# Patient Record
Sex: Female | Born: 1965 | Race: White | Hispanic: No | Marital: Single | Smoking: Former smoker
Health system: Southern US, Community
[De-identification: ages and names within clinical notes are randomized; demographics above are authoritative.]

## PROBLEM LIST (undated history)

## (undated) DIAGNOSIS — I639 Cerebral infarction, unspecified: Secondary | ICD-10-CM

## (undated) DIAGNOSIS — I428 Other cardiomyopathies: Secondary | ICD-10-CM

## (undated) DIAGNOSIS — I7409 Other arterial embolism and thrombosis of abdominal aorta: Secondary | ICD-10-CM

## (undated) DIAGNOSIS — R0789 Other chest pain: Secondary | ICD-10-CM

## (undated) DIAGNOSIS — I739 Peripheral vascular disease, unspecified: Secondary | ICD-10-CM

## (undated) DIAGNOSIS — E785 Hyperlipidemia, unspecified: Secondary | ICD-10-CM

## (undated) DIAGNOSIS — I771 Stricture of artery: Secondary | ICD-10-CM

## (undated) DIAGNOSIS — I509 Heart failure, unspecified: Secondary | ICD-10-CM

## (undated) DIAGNOSIS — Z9889 Other specified postprocedural states: Secondary | ICD-10-CM

## (undated) DIAGNOSIS — I200 Unstable angina: Secondary | ICD-10-CM

## (undated) DIAGNOSIS — I447 Left bundle-branch block, unspecified: Secondary | ICD-10-CM

## (undated) DIAGNOSIS — I10 Essential (primary) hypertension: Secondary | ICD-10-CM

## (undated) DIAGNOSIS — I340 Nonrheumatic mitral (valve) insufficiency: Secondary | ICD-10-CM

## (undated) DIAGNOSIS — R0600 Dyspnea, unspecified: Secondary | ICD-10-CM

## (undated) DIAGNOSIS — I2510 Atherosclerotic heart disease of native coronary artery without angina pectoris: Secondary | ICD-10-CM

## (undated) DIAGNOSIS — Z95810 Presence of automatic (implantable) cardiac defibrillator: Secondary | ICD-10-CM

## (undated) DIAGNOSIS — I2129 ST elevation (STEMI) myocardial infarction involving other sites: Secondary | ICD-10-CM

## (undated) HISTORY — PX: APPENDECTOMY: SHX54

## (undated) HISTORY — PX: TUBAL LIGATION: SHX77

---

## 2003-10-28 ENCOUNTER — Ambulatory Visit: Payer: Self-pay | Admitting: Family Medicine

## 2003-11-05 ENCOUNTER — Emergency Department: Payer: Self-pay | Admitting: Emergency Medicine

## 2003-11-05 ENCOUNTER — Other Ambulatory Visit: Payer: Self-pay

## 2003-11-23 ENCOUNTER — Ambulatory Visit: Payer: Self-pay | Admitting: Family Medicine

## 2004-02-25 ENCOUNTER — Emergency Department: Payer: Self-pay | Admitting: General Practice

## 2011-07-05 ENCOUNTER — Emergency Department: Payer: Self-pay | Admitting: Emergency Medicine

## 2011-07-05 LAB — COMPREHENSIVE METABOLIC PANEL
Albumin: 3.6 g/dL (ref 3.4–5.0)
Alkaline Phosphatase: 86 U/L (ref 50–136)
Bilirubin,Total: 0.5 mg/dL (ref 0.2–1.0)
Creatinine: 0.77 mg/dL (ref 0.60–1.30)
EGFR (African American): >60
Glucose: 81 mg/dL (ref 65–99)
Osmolality: 276 (ref 275–301)
SGOT(AST): 22 U/L (ref 15–37)
Sodium: 138 mmol/L (ref 136–145)
Total Protein: 7.6 g/dL (ref 6.4–8.2)

## 2011-07-05 LAB — CBC
HGB: 14.8 g/dL (ref 12.0–16.0)
MCH: 30.4 pg (ref 26.0–34.0)
MCHC: 33.4 g/dL (ref 32.0–36.0)
MCV: 91 fL (ref 80–100)

## 2011-07-05 LAB — TROPONIN I: Troponin-I: <0.02 ng/mL

## 2011-11-13 DIAGNOSIS — I2510 Atherosclerotic heart disease of native coronary artery without angina pectoris: Secondary | ICD-10-CM

## 2011-11-13 HISTORY — DX: Atherosclerotic heart disease of native coronary artery without angina pectoris: I25.10

## 2012-10-07 ENCOUNTER — Encounter (HOSPITAL_COMMUNITY): Payer: Self-pay | Admitting: *Deleted

## 2012-10-07 ENCOUNTER — Observation Stay (HOSPITAL_COMMUNITY)
Admission: EM | Admit: 2012-10-07 | Discharge: 2012-10-08 | Disposition: A | Discharge status: Home / Self Care | Payer: BC Managed Care – PPO | Attending: Internal Medicine | Admitting: Internal Medicine

## 2012-10-07 DIAGNOSIS — E785 Hyperlipidemia, unspecified: Secondary | ICD-10-CM | POA: Insufficient documentation

## 2012-10-07 DIAGNOSIS — Z8673 Personal history of transient ischemic attack (TIA), and cerebral infarction without residual deficits: Secondary | ICD-10-CM | POA: Insufficient documentation

## 2012-10-07 DIAGNOSIS — Z880 Allergy status to penicillin: Secondary | ICD-10-CM | POA: Insufficient documentation

## 2012-10-07 DIAGNOSIS — F17200 Nicotine dependence, unspecified, uncomplicated: Secondary | ICD-10-CM | POA: Insufficient documentation

## 2012-10-07 DIAGNOSIS — I4460 Unspecified fascicular block: Secondary | ICD-10-CM | POA: Insufficient documentation

## 2012-10-07 DIAGNOSIS — R0789 Other chest pain: Principal | ICD-10-CM | POA: Diagnosis present

## 2012-10-07 DIAGNOSIS — I10 Essential (primary) hypertension: Secondary | ICD-10-CM | POA: Diagnosis present

## 2012-10-07 DIAGNOSIS — Z720 Tobacco use: Secondary | ICD-10-CM | POA: Diagnosis present

## 2012-10-07 DIAGNOSIS — I447 Left bundle-branch block, unspecified: Secondary | ICD-10-CM | POA: Diagnosis present

## 2012-10-07 DIAGNOSIS — Z23 Encounter for immunization: Secondary | ICD-10-CM | POA: Insufficient documentation

## 2012-10-07 DIAGNOSIS — R079 Chest pain, unspecified: Secondary | ICD-10-CM

## 2012-10-07 DIAGNOSIS — Z9889 Other specified postprocedural states: Secondary | ICD-10-CM | POA: Insufficient documentation

## 2012-10-07 DIAGNOSIS — Z79899 Other long term (current) drug therapy: Secondary | ICD-10-CM | POA: Insufficient documentation

## 2012-10-07 DIAGNOSIS — Z7982 Long term (current) use of aspirin: Secondary | ICD-10-CM | POA: Insufficient documentation

## 2012-10-07 HISTORY — DX: Cerebral infarction, unspecified: I63.9

## 2012-10-07 HISTORY — DX: Essential (primary) hypertension: I10

## 2012-10-07 LAB — CBC
HCT: 45 % (ref 36.0–46.0)
Hemoglobin: 15.1 g/dL — ABNORMAL HIGH (ref 12.0–15.0)
MCH: 29.3 pg (ref 26.0–34.0)
MCV: 87.4 fL (ref 78.0–100.0)
RBC: 5.15 MIL/uL — ABNORMAL HIGH (ref 3.87–5.11)
WBC: 9.9 10*3/uL (ref 4.0–10.5)

## 2012-10-07 LAB — BASIC METABOLIC PANEL
BUN: 10 mg/dL (ref 6–23)
CO2: 22 mEq/L (ref 19–32)
Calcium: 9.2 mg/dL (ref 8.4–10.5)
Chloride: 102 mEq/L (ref 96–112)
Creatinine, Ser: 0.66 mg/dL (ref 0.50–1.10)
Glucose, Bld: 74 mg/dL (ref 70–99)

## 2012-10-07 LAB — POCT I-STAT TROPONIN I: Troponin i, poc: 0 ng/mL (ref 0.00–0.08)

## 2012-10-07 MED ORDER — ASPIRIN EC 325 MG PO TBEC: 325.0000 mg | DELAYED_RELEASE_TABLET | Freq: Every day | ORAL | Status: DC

## 2012-10-07 MED ORDER — ONDANSETRON HCL 4 MG/2ML IJ SOLN: 4.0000 mg | Freq: Four times a day (QID) | INTRAMUSCULAR | Status: DC | PRN

## 2012-10-07 MED ORDER — ENOXAPARIN SODIUM 40 MG/0.4ML ~~LOC~~ SOLN
40.0000 mg | SUBCUTANEOUS | Status: DC
  Filled 2012-10-07 (×2): qty 0.4

## 2012-10-07 MED ORDER — OXYCODONE-ACETAMINOPHEN 5-325 MG PO TABS
2.0000 | ORAL_TABLET | Freq: Once | ORAL | Status: AC
  Administered 2012-10-07: 2 via ORAL
  Filled 2012-10-07: qty 2

## 2012-10-07 MED ORDER — ACETAMINOPHEN 325 MG PO TABS: 650.0000 mg | ORAL_TABLET | ORAL | Status: DC | PRN

## 2012-10-07 MED ORDER — MORPHINE SULFATE 2 MG/ML IJ SOLN
2.0000 mg | INTRAMUSCULAR | Status: DC | PRN
  Administered 2012-10-08 (×3): 2 mg via INTRAVENOUS
  Filled 2012-10-07 (×3): qty 1

## 2012-10-07 MED ORDER — LISINOPRIL 5 MG PO TABS
5.0000 mg | ORAL_TABLET | Freq: Every day | ORAL | Status: DC
  Administered 2012-10-08: 5 mg via ORAL
  Filled 2012-10-07: qty 1

## 2012-10-07 MED ORDER — NITROGLYCERIN 0.4 MG/HR TD PT24
0.4000 mg | MEDICATED_PATCH | Freq: Every day | TRANSDERMAL | Status: DC
  Administered 2012-10-08 (×2): 0.4 mg via TRANSDERMAL
  Filled 2012-10-07 (×2): qty 1

## 2012-10-07 NOTE — ED Notes (Signed)
Pt states she had her cardiac craterizations at [facility] and [facility] (one at each).

## 2012-10-07 NOTE — ED Notes (Signed)
Phlebotomy called to inquire about blood work as it should be resulted by now.

## 2012-10-07 NOTE — ED Provider Notes (Signed)
CSN: [id number]     Arrival date & time 10/07/12  1910 History   First MD Initiated Contact with Patient 10/07/12 1916     Chief Complaint  Patient presents with  . Chest Pain   (Consider location/radiation/quality/duration/timing/severity/associated sxs/prior Treatment) HPI  47 y/o female with hx of CAD with 50% lesion in the L Cx artery presents with intermittent CP which started at 2 PM - has been intermittent and lasts several minutes - is a dull pain with intermittent sharp sensations into her chest - she is a smoker, has hld and htn.  She dmits to taking her medicines - onset was spontaneous at rest.  She does have hx of LBBB as seen on prior [facility] notes through Epic. Last cath was also evaluated and found to have above findings (see MDM).  Sx are worse with taking deep breath.  Past Medical History  Diagnosis Date  . Hypertension   . Stroke     tia with no deficits   Past Surgical History  Procedure Laterality Date  . Cardiac catheterization    . Tubal ligation    . Appendectomy     Family History  Problem Relation Age of Onset  . CAD Mother   . Lung cancer Mother   . Bladder Cancer Mother   . Stroke Mother   . CAD Father    History  Substance Use Topics  . Smoking status: Current Every Day Smoker  . Smokeless tobacco: Not on file  . Alcohol Use: No   OB History   Grav Para Term Preterm Abortions TAB SAB Ect Mult Living                 Review of Systems  All other systems reviewed and are negative.    Allergies  Penicillins  Home Medications   No current outpatient prescriptions on file. BP 133/76  Pulse 85  Temp(Src) 97.6 F (36.4 C) (Oral)  Resp 20  SpO2 96% Physical Exam  Nursing note and vitals reviewed. Constitutional: She appears well-developed and well-nourished. No distress.  HENT:  Head: Normocephalic and atraumatic.  Mouth/Throat: Oropharynx is clear and moist. No oropharyngeal exudate.  Eyes: Conjunctivae and EOM are normal. Pupils  are equal, round, and reactive to light. Right eye exhibits no discharge. Left eye exhibits no discharge. No scleral icterus.  Neck: Normal range of motion. Neck supple. No JVD present. No thyromegaly present.  Cardiovascular: Normal rate, regular rhythm, normal heart sounds and intact distal pulses.  Exam reveals no gallop and no friction rub.   No murmur heard. Pulmonary/Chest: Effort normal and breath sounds normal. No respiratory distress. She has no wheezes. She has no rales. She exhibits no tenderness.  Abdominal: Soft. Bowel sounds are normal. She exhibits no distension and no mass. There is no tenderness.  Musculoskeletal: Normal range of motion. She exhibits no edema and no tenderness.  Lymphadenopathy:    She has no cervical adenopathy.  Neurological: She is alert. Coordination normal.  Skin: Skin is warm and dry. No rash noted. No erythema.  Psychiatric: She has a normal mood and affect. Her behavior is normal.    ED Course  Procedures (including critical care time) Labs Review Labs Reviewed  CBC - Abnormal; Notable for the following:    RBC 5.15 (*)    Hemoglobin 15.1 (*)    All other components within normal limits  BASIC METABOLIC PANEL  CBC  CREATININE, SERUM  COMPREHENSIVE METABOLIC PANEL  CBC  TSH  URINE  RAPID DRUG SCREEN (HOSP PERFORMED)  PREGNANCY, URINE  POCT I-STAT TROPONIN I   Imaging Review No results found.  MDM   1. Chest pain   2. HTN (hypertension)    Pt is clutching the L side of her chest - she has a LBBB which is not new.  She has known 50% stenosis lesion.  She is at increased risk of obstructive CAD.    ED ECG REPORT  I personally interpreted this EKG   Date: 10/07/2012   Rate: 91  Rhythm: normal sinus rhythm  QRS Axis: left  Intervals: normal  ST/T Wave abnormalities: nonspecific ST/T changes  Conduction Disutrbances:left bundle branch block  Narrative Interpretation:   Old EKG Reviewed: none available  B/c of pt's risk and  abnormal ECG (LBBB) she will be admitted for r/o.  46yoF w/ h/o CVA, HTN, HLD presents as a transfer from [facility] ER for chest pain in the  setting of LBBB.   **Atypical Chest pain: Pt presented from OSH with question of new LBBB in the setting of a  historical 50% lesion to left circ. LBBB found not to be new. Pt enzymes were cycled and an EKG was  obtained. Both were negative. Given pts history of a known lesion in her proximal circ, it was  decided that the patient would benefit from a catheterization. Cath redemonstrated proximal circ  lesion, but FFR was >0.8 and no intervention was done.    Vida Roller, MD 10/07/12 2352

## 2012-10-07 NOTE — ED Notes (Signed)
Inquired mini lab about pt's I-stat troponin, troponin: 0.00, had not crossed over yet. MD informed.

## 2012-10-07 NOTE — ED Notes (Signed)
Pt with dull chest pain that increased to a sharp pain that started around 1500.  Hx of 2 cardiac caths.  Pt given 4 81 mg asa and 1 nitro with no decrease in pain.

## 2012-10-07 NOTE — ED Notes (Signed)
Phlebotomy called and inquired about pt's lab work as it was not resulted yet.

## 2012-10-07 NOTE — H&P (Addendum)
[facility] History and Physical  Lindsay Camacho ZOX:[id number] DOB: Aug 05, 1965 DOA: 10/07/2012  Referring physician: ER physician. PCP: No PCP Per Patient   Chief Complaint: Chest pain.  HPI: Lindsay Camacho is a 47 y.o. female history of hypertension hyperlipidemia ongoing tobacco abuse and previous history of stroke who has had a cardiac catheter last year in September at [facility] which showed 50% stenosis in the proximal left circumflex and was managed conservatively presents today because of chest pain. Patient states around 2 PM she started developing chest pain which was left-sided radiating to her left arm and back, stabbing in nature and has no relation to exertion. No associated shortness of breath diaphoresis or nausea. It happened multiple times. In the ER patient's chest pain got better after Percocet was given. Patient's cardiac markers have been negative. EKG shows LBBB and as per previous records obtained through Care Everywhere patient does have chronic LBBB. Patient has been admitted for further management. Patient otherwise denies any nausea vomiting abdominal pain fever chills.  Review of Systems: As presented in the history of presenting illness, rest negative.  Past Medical History  Diagnosis Date  . Hypertension   . Stroke     tia with no deficits   Past Surgical History  Procedure Laterality Date  . Cardiac catheterization    . Tubal ligation    . Appendectomy     Social History:  reports that she has been smoking.  She does not have any smokeless tobacco history on file. She reports that she does not drink alcohol or use illicit drugs. Home. where does patient live-- yes Can patient participate in ADLs?  Allergies  Allergen Reactions  . Penicillins Rash    Family History  Problem Relation Age of Onset  . CAD Mother   . Lung cancer Mother   . Bladder Cancer Mother   . Stroke Mother   . CAD Father       Prior to Admission medications    Medication Sig Start Date End Date Taking? Authorizing Provider  ibuprofen (ADVIL,MOTRIN) 200 MG tablet Take 600 mg by mouth daily as needed for headache.   Yes Historical Provider, MD  lisinopril (PRINIVIL,ZESTRIL) 5 MG tablet Take 5 mg by mouth daily.   Yes Historical Provider, MD   Physical Exam: Filed Vitals:   10/07/12 2100 10/07/12 2130 10/07/12 2200 10/07/12 2230  BP: 116/80 132/74 144/84 153/87  Pulse: 85 88 88 92  Temp:      TempSrc:      Resp: 20 15 16 15   SpO2: 100% 98% 97% 97%     General:  Well developed and moderately nourished.  Eyes: Anicteric no pallor.  ENT: No discharge from ears eyes nose mouth.  Neck: No mass felt.  Cardiovascular: S1-S2 heard.  Respiratory: No rhonchi or crepitations.  Abdomen: Soft nontender bowel sounds present.  Skin: No rash.  Musculoskeletal: No edema.  Psychiatric: Appears normal.  Neurologic: Alert awake oriented to time place and person. Moves all extremities.  Labs on Admission:  Basic Metabolic Panel:  Recent Labs Lab 10/07/12 1948  NA 137  K 3.7  CL 102  CO2 22  GLUCOSE 74  BUN 10  CREATININE 0.66  CALCIUM 9.2   Liver Function Tests: No results found for this basename: AST, ALT, ALKPHOS, BILITOT, PROT, ALBUMIN,  in the last 168 hours No results found for this basename: LIPASE, AMYLASE,  in the last 168 hours No results found for this basename: AMMONIA,  in the  last 168 hours CBC:  Recent Labs Lab 10/07/12 1948  WBC 9.9  HGB 15.1*  HCT 45.0  MCV 87.4  PLT 212   Cardiac Enzymes: No results found for this basename: CKTOTAL, CKMB, CKMBINDEX, TROPONINI,  in the last 168 hours  BNP (last 3 results) No results found for this basename: PROBNP,  in the last 8760 hours CBG: No results found for this basename: GLUCAP,  in the last 168 hours  Radiological Exams on Admission: No results found.  EKG: Independently reviewed. Normal sinus rhythm with LBBB.  Assessment/Plan Principal Problem:   Chest  pain Active Problems:   HTN (hypertension)   LBBB (left bundle branch block)   Tobacco abuse   1. Chest pain - appears atypical but given patient's risk factors would cycle cardiac markers. Check d-dimer. Check chest x-ray stat. Aspirin. Nitroglycerin. When necessary morphine. Check drug screen. 2. Chronic LBBB. 3. Hypertension - continue present medications. 4. Tobacco abuse - strongly advised to quit smoking. 5. History of CVA - presently has no residual symptoms. 6. Cardiac catheter last at [facility] - notes available through care everywhere.    Code Status: Full code.  Family Communication: None.  Disposition Plan: Admit for observation.    KAKRAKANDY,ARSHAD N. [facility] Pager [phone].  If 7PM-7AM, please contact night-coverage [email or web address] Password [email or web address] 10/07/2012, 11:11 PM

## 2012-10-07 NOTE — ED Notes (Signed)
Pt requesting pain medication, MD informed. Will wait to see lab work results first.

## 2012-10-08 ENCOUNTER — Encounter (HOSPITAL_COMMUNITY): Payer: Self-pay | Admitting: *Deleted

## 2012-10-08 ENCOUNTER — Observation Stay (HOSPITAL_COMMUNITY): Payer: BC Managed Care – PPO

## 2012-10-08 DIAGNOSIS — R0789 Other chest pain: Secondary | ICD-10-CM

## 2012-10-08 DIAGNOSIS — E785 Hyperlipidemia, unspecified: Secondary | ICD-10-CM

## 2012-10-08 DIAGNOSIS — F17200 Nicotine dependence, unspecified, uncomplicated: Secondary | ICD-10-CM

## 2012-10-08 LAB — CBC
HCT: 43 % (ref 36.0–46.0)
Hemoglobin: 14.5 g/dL (ref 12.0–15.0)
MCH: 29.3 pg (ref 26.0–34.0)
MCHC: 33.3 g/dL (ref 30.0–36.0)
MCV: 86.9 fL (ref 78.0–100.0)
MCV: 87.9 fL (ref 78.0–100.0)
Platelets: 192 10*3/uL (ref 150–400)
RBC: 4.95 MIL/uL (ref 3.87–5.11)
RDW: 13.9 % (ref 11.5–15.5)
RDW: 14.1 % (ref 11.5–15.5)
WBC: 8.7 10*3/uL (ref 4.0–10.5)

## 2012-10-08 LAB — PREGNANCY, URINE: Preg Test, Ur: NEGATIVE

## 2012-10-08 LAB — COMPREHENSIVE METABOLIC PANEL
ALT: 9 U/L (ref 0–35)
AST: 17 U/L (ref 0–37)
Albumin: 3.3 g/dL — ABNORMAL LOW (ref 3.5–5.2)
CO2: 28 mEq/L (ref 19–32)
Calcium: 8.9 mg/dL (ref 8.4–10.5)
Chloride: 104 mEq/L (ref 96–112)
GFR calc non Af Amer: >90 mL/min (ref >90)
Sodium: 140 mEq/L (ref 135–145)
Total Bilirubin: 0.7 mg/dL (ref 0.3–1.2)

## 2012-10-08 LAB — RAPID URINE DRUG SCREEN, HOSP PERFORMED
Amphetamines: NOT DETECTED
Barbiturates: NOT DETECTED
Tetrahydrocannabinol: POSITIVE — AB

## 2012-10-08 LAB — TSH: TSH: 2.912 u[IU]/mL (ref 0.350–4.500)

## 2012-10-08 LAB — D-DIMER, QUANTITATIVE (NOT AT ARMC): D-Dimer, Quant: 0.37 ug/mL-FEU (ref 0.00–0.48)

## 2012-10-08 LAB — CREATININE, SERUM: GFR calc Af Amer: >90 mL/min (ref >90)

## 2012-10-08 LAB — TROPONIN I: Troponin I: <0.3 ng/mL (ref <0.30)

## 2012-10-08 MED ORDER — METOPROLOL SUCCINATE ER 25 MG PO TB24: 25.0000 mg | ORAL_TABLET | Freq: Every day | ORAL | Status: DC

## 2012-10-08 MED ORDER — PNEUMOCOCCAL VAC POLYVALENT 25 MCG/0.5ML IJ INJ: 0.5000 mL | INJECTION | INTRAMUSCULAR | Status: DC

## 2012-10-08 MED ORDER — ATORVASTATIN CALCIUM 80 MG PO TABS: 80.0000 mg | ORAL_TABLET | Freq: Every day | ORAL | Status: DC

## 2012-10-08 MED ORDER — ASPIRIN 81 MG PO TABS: 81.0000 mg | ORAL_TABLET | Freq: Every day | ORAL | Status: DC

## 2012-10-08 MED ORDER — KETOROLAC TROMETHAMINE 30 MG/ML IJ SOLN
30.0000 mg | Freq: Once | INTRAMUSCULAR | Status: AC
  Administered 2012-10-08: 30 mg via INTRAVENOUS
  Filled 2012-10-08: qty 1

## 2012-10-08 NOTE — Progress Notes (Signed)
[facility]  [id number] [place] [address] Phone: [phone]  October 08, 2012  Patient: Lindsay Camacho  Date of Birth: 1965-11-30  Date of Visit: 10/07/2012    To Whom It May Concern:  Maretta Overdorf was seen and treated in our emergency department on 10/07/2012. Kalilah Barua  may return to work on 10/09/12.  Sincerely,     Crista Curb, M.D.

## 2012-10-08 NOTE — Progress Notes (Signed)
Pt with c/o sharp, stabbing left sided chest pain radiating to left arm.  Pt. States 8/10 pain score.  EKG obtained per standing orders.  Pt. States "Pain is better when I sit up instead of lie down."  When placing EKG electrodes on patient's chest patient withdrew from staff and states "that hurts when you touch my chest." 2mg  IV morphine given per orders.  Pt. With complete resolution of symptoms after morphine given.  BP 125/64. Will continue to monitor patient.

## 2012-10-08 NOTE — Progress Notes (Addendum)
Pt. C/o left sided CP radiating to arm.  C/o sharp, stabbing pain 4/10.  2mg  IV morphine given per orders with resolution of pain.  Pt instructed to call staff if CP returns.  Pt. Verbalizes understanding.  EKG done per protocol with possible ST elevation in leads v1, v2, v3, v4.  Per MD chronic ST elevation.  Nitrodur patch ordered by MD and placed on left upper arm.  Will continue to monitor.

## 2012-10-08 NOTE — Discharge Summary (Signed)
Physician Discharge Summary  Lindsay Camacho ZOX:[id number] DOB: 05/31/1965 DOA: 10/07/2012  PCP: No PCP Per Patient  Admit date: 10/07/2012 Discharge date: 10/08/2012  Recommendations for Outpatient Follow-up:  1. Needs to establish PCP  Discharge Diagnoses:  Principal Problem:   Chest pain, musculoskeletal Active Problems:   HTN (hypertension)   LBBB (left bundle branch block)   Tobacco abuse   Discharge Condition: stable  Filed Weights   10/07/12 2359  Weight: 55.656 kg (122 lb 11.2 oz)    History of present illness:  Lindsay Camacho is a 47 y.o. female history of hypertension hyperlipidemia ongoing tobacco abuse and previous history of stroke who has had a cardiac catheter last year in September at [facility] which showed 50% stenosis in the proximal left circumflex and was managed conservatively presents today because of chest pain. Patient states around 2 PM she started developing chest pain which was left-sided radiating to her left arm and back, stabbing in nature and has no relation to exertion. No associated shortness of breath diaphoresis or nausea. It happened multiple times. In the ER patient's chest pain got better after Percocet was given. Patient's cardiac markers have been negative. EKG shows LBBB and as per previous records obtained through Care Everywhere patient does have chronic LBBB. Patient has been admitted for further management. Patient otherwise denies any nausea vomiting abdominal pain fever chills.   Hospital Course:  Observed on tele.  MI ruled out.  Encouraged to quit smoking.  Started back on previous meds (she ran out of most PTA).    Procedures:  none  Consultations:  none  Discharge Exam: Filed Vitals:   10/08/12 0839  BP: 131/71  Pulse: 73  Temp: 97.6 F (36.4 C)  Resp: 16    General: alert, oriented and comfortable Cardiovascular: RRR without MGR Respiratory: CTA without WRR  Discharge Instructions  Discharge Orders   Future  Orders Complete By Expires   Activity as tolerated - No restrictions  As directed    Diet - low sodium heart healthy  As directed    Discharge instructions  As directed    Comments:     Quit smoking       Medication List         aspirin 81 MG tablet  Take 1 tablet (81 mg total) by mouth daily.     atorvastatin 80 MG tablet  Commonly known as:  LIPITOR  Take 1 tablet (80 mg total) by mouth daily.     ibuprofen 200 MG tablet  Commonly known as:  ADVIL,MOTRIN  Take 600 mg by mouth daily as needed for headache.     lisinopril 5 MG tablet  Commonly known as:  PRINIVIL,ZESTRIL  Take 5 mg by mouth daily.     metoprolol succinate 25 MG 24 hr tablet  Commonly known as:  TOPROL XL  Take 1 tablet (25 mg total) by mouth daily.       Allergies  Allergen Reactions  . Penicillins Rash       Follow-up Information   Follow up with establish primary care physician In 2 weeks.       The results of significant diagnostics from this hospitalization (including imaging, microbiology, ancillary and laboratory) are listed below for reference.    Significant Diagnostic Studies: Dg Chest Port 1 View  10/08/2012   *RADIOLOGY REPORT*  Clinical Data: Left-sided chest pain  PORTABLE CHEST - 1 VIEW  Comparison: None.  Findings: Single frontal view the chest demonstrates cardiac and mediastinal  contours which are within normal limits.  No focal airspace consolidation, pleural effusion or pneumothorax.  Mild prominence of the bibasilar interstitial markings may reflect subsegmental atelectasis.  No acute osseous abnormality.  IMPRESSION: Mildly increased prominence of the bibasilar interstitial markings favored to reflect subsegmental atelectasis.  Otherwise, no acute cardiopulmonary process.   Original Report Authenticated By: Malachy Moan, M.D.    Microbiology: No results found for this or any previous visit (from the past 240 hour(s)).   Labs: Basic Metabolic Panel:  Recent Labs Lab  10/07/12 1948 10/08/12 0151 10/08/12 0540  NA 137  --  140  K 3.7  --  4.0  CL 102  --  104  CO2 22  --  28  GLUCOSE 74  --  88  BUN 10  --  10  CREATININE 0.66 0.62 0.70  CALCIUM 9.2  --  8.9   Liver Function Tests:  Recent Labs Lab 10/08/12 0540  AST 17  ALT 9  ALKPHOS 93  BILITOT 0.7  PROT 7.2  ALBUMIN 3.3*   No results found for this basename: LIPASE, AMYLASE,  in the last 168 hours No results found for this basename: AMMONIA,  in the last 168 hours CBC:  Recent Labs Lab 10/07/12 1948 10/08/12 0151 10/08/12 0540  WBC 9.9 8.7 7.7  HGB 15.1* 14.9 14.5  HCT 45.0 43.0 43.5  MCV 87.4 86.9 87.9  PLT 212 192 213   Cardiac Enzymes:  Recent Labs Lab 10/08/12 0140 10/08/12 0540  TROPONINI <0.30 <0.30   BNP: BNP (last 3 results) No results found for this basename: PROBNP,  in the last 8760 hours CBG: No results found for this basename: GLUCAP,  in the last 168 hours  EKG Normal sinus rhythm Left bundle branch block Abnormal ECG Since last tracing rate slower  Signed:  [name] L  [facility] 10/08/2012, 10:19 AM

## 2013-01-02 DIAGNOSIS — I2129 ST elevation (STEMI) myocardial infarction involving other sites: Secondary | ICD-10-CM

## 2013-01-02 HISTORY — DX: ST elevation (STEMI) myocardial infarction involving other sites: I21.29

## 2013-01-17 ENCOUNTER — Emergency Department (HOSPITAL_COMMUNITY): Payer: BC Managed Care – PPO

## 2013-01-17 ENCOUNTER — Encounter (HOSPITAL_COMMUNITY): Payer: Self-pay | Admitting: Emergency Medicine

## 2013-01-17 ENCOUNTER — Inpatient Hospital Stay (HOSPITAL_COMMUNITY)
Admission: EM | Admit: 2013-01-17 | Discharge: 2013-01-20 | DRG: 246 | Disposition: A | Discharge status: Home / Self Care | Payer: BC Managed Care – PPO | Attending: Cardiovascular Disease | Admitting: Cardiovascular Disease

## 2013-01-17 DIAGNOSIS — L270 Generalized skin eruption due to drugs and medicaments taken internally: Secondary | ICD-10-CM | POA: Diagnosis not present

## 2013-01-17 DIAGNOSIS — I2589 Other forms of chronic ischemic heart disease: Secondary | ICD-10-CM | POA: Diagnosis present

## 2013-01-17 DIAGNOSIS — R0789 Other chest pain: Secondary | ICD-10-CM | POA: Diagnosis present

## 2013-01-17 DIAGNOSIS — I10 Essential (primary) hypertension: Secondary | ICD-10-CM | POA: Diagnosis present

## 2013-01-17 DIAGNOSIS — F17200 Nicotine dependence, unspecified, uncomplicated: Secondary | ICD-10-CM | POA: Diagnosis present

## 2013-01-17 DIAGNOSIS — I447 Left bundle-branch block, unspecified: Secondary | ICD-10-CM | POA: Diagnosis present

## 2013-01-17 DIAGNOSIS — I2129 ST elevation (STEMI) myocardial infarction involving other sites: Secondary | ICD-10-CM

## 2013-01-17 DIAGNOSIS — E785 Hyperlipidemia, unspecified: Secondary | ICD-10-CM | POA: Diagnosis present

## 2013-01-17 DIAGNOSIS — I214 Non-ST elevation (NSTEMI) myocardial infarction: Principal | ICD-10-CM | POA: Diagnosis present

## 2013-01-17 DIAGNOSIS — Z79899 Other long term (current) drug therapy: Secondary | ICD-10-CM

## 2013-01-17 DIAGNOSIS — I2510 Atherosclerotic heart disease of native coronary artery without angina pectoris: Secondary | ICD-10-CM | POA: Diagnosis present

## 2013-01-17 DIAGNOSIS — I5031 Acute diastolic (congestive) heart failure: Secondary | ICD-10-CM | POA: Diagnosis present

## 2013-01-17 DIAGNOSIS — Z720 Tobacco use: Secondary | ICD-10-CM | POA: Diagnosis present

## 2013-01-17 DIAGNOSIS — I509 Heart failure, unspecified: Secondary | ICD-10-CM | POA: Diagnosis present

## 2013-01-17 DIAGNOSIS — I5041 Acute combined systolic (congestive) and diastolic (congestive) heart failure: Secondary | ICD-10-CM

## 2013-01-17 DIAGNOSIS — R21 Rash and other nonspecific skin eruption: Secondary | ICD-10-CM | POA: Diagnosis not present

## 2013-01-17 DIAGNOSIS — I255 Ischemic cardiomyopathy: Secondary | ICD-10-CM | POA: Diagnosis present

## 2013-01-17 DIAGNOSIS — Z7982 Long term (current) use of aspirin: Secondary | ICD-10-CM

## 2013-01-17 DIAGNOSIS — I739 Peripheral vascular disease, unspecified: Secondary | ICD-10-CM | POA: Diagnosis present

## 2013-01-17 DIAGNOSIS — Z8673 Personal history of transient ischemic attack (TIA), and cerebral infarction without residual deficits: Secondary | ICD-10-CM

## 2013-01-17 DIAGNOSIS — T50995A Adverse effect of other drugs, medicaments and biological substances, initial encounter: Secondary | ICD-10-CM | POA: Diagnosis not present

## 2013-01-17 HISTORY — PX: CORONARY ANGIOPLASTY WITH STENT PLACEMENT: SHX49

## 2013-01-17 HISTORY — DX: Atherosclerotic heart disease of native coronary artery without angina pectoris: I25.10

## 2013-01-17 HISTORY — DX: ST elevation (STEMI) myocardial infarction involving other sites: I21.29

## 2013-01-17 HISTORY — DX: Left bundle-branch block, unspecified: I44.7

## 2013-01-17 HISTORY — DX: Peripheral vascular disease, unspecified: I73.9

## 2013-01-17 HISTORY — DX: Hyperlipidemia, unspecified: E78.5

## 2013-01-17 LAB — CBC WITH DIFFERENTIAL/PLATELET
BASOS PCT: 0 % (ref 0–1)
Basophils Absolute: 0 10*3/uL (ref 0.0–0.1)
Eosinophils Absolute: 0.2 10*3/uL (ref 0.0–0.7)
Eosinophils Relative: 3 % (ref 0–5)
HCT: 41.8 % (ref 36.0–46.0)
HEMOGLOBIN: 14.2 g/dL (ref 12.0–15.0)
LYMPHS ABS: 1.8 10*3/uL (ref 0.7–4.0)
LYMPHS PCT: 21 % (ref 12–46)
MCH: 29.9 pg (ref 26.0–34.0)
MCHC: 34 g/dL (ref 30.0–36.0)
MCV: 88 fL (ref 78.0–100.0)
MONOS PCT: 7 % (ref 3–12)
Monocytes Absolute: 0.6 10*3/uL (ref 0.1–1.0)
NEUTROS ABS: 5.7 10*3/uL (ref 1.7–7.7)
NEUTROS PCT: 69 % (ref 43–77)
Platelets: 202 10*3/uL (ref 150–400)
RBC: 4.75 MIL/uL (ref 3.87–5.11)
RDW: 13.7 % (ref 11.5–15.5)
WBC: 8.3 10*3/uL (ref 4.0–10.5)

## 2013-01-17 LAB — POCT I-STAT TROPONIN I: Troponin i, poc: 1.97 ng/mL (ref 0.00–0.08)

## 2013-01-17 LAB — BASIC METABOLIC PANEL
BUN: 9 mg/dL (ref 6–23)
CO2: 23 mEq/L (ref 19–32)
Calcium: 8.7 mg/dL (ref 8.4–10.5)
Chloride: 103 mEq/L (ref 96–112)
Creatinine, Ser: 0.65 mg/dL (ref 0.50–1.10)
GFR calc non Af Amer: >90 mL/min (ref >90)
GLUCOSE: 94 mg/dL (ref 70–99)
POTASSIUM: 3.4 meq/L — AB (ref 3.7–5.3)
Sodium: 140 mEq/L (ref 137–147)

## 2013-01-17 LAB — PRO B NATRIURETIC PEPTIDE: PRO B NATRI PEPTIDE: 4026 pg/mL — AB (ref 0–125)

## 2013-01-17 LAB — TROPONIN I: Troponin I: 3.66 ng/mL (ref <0.30)

## 2013-01-17 MED ORDER — TICAGRELOR 90 MG PO TABS
180.0000 mg | ORAL_TABLET | Freq: Once | ORAL | Status: AC
  Administered 2013-01-17: 180 mg via ORAL
  Filled 2013-01-17: qty 2

## 2013-01-17 MED ORDER — HEPARIN BOLUS VIA INFUSION
3000.0000 [IU] | Freq: Once | INTRAVENOUS | Status: AC
  Administered 2013-01-17: 3000 [IU] via INTRAVENOUS
  Filled 2013-01-17: qty 3000

## 2013-01-17 MED ORDER — ASPIRIN 81 MG PO TABS: 81.0000 mg | ORAL_TABLET | Freq: Every day | ORAL | Status: DC

## 2013-01-17 MED ORDER — ACETAMINOPHEN 325 MG PO TABS
650.0000 mg | ORAL_TABLET | ORAL | Status: DC | PRN
  Administered 2013-01-17: 650 mg via ORAL
  Filled 2013-01-17: qty 2

## 2013-01-17 MED ORDER — NITROGLYCERIN IN D5W 200-5 MCG/ML-% IV SOLN
10.0000 ug/min | INTRAVENOUS | Status: DC
  Administered 2013-01-17: 10 ug/min via INTRAVENOUS
  Filled 2013-01-17: qty 250

## 2013-01-17 MED ORDER — HEPARIN (PORCINE) IN NACL 100-0.45 UNIT/ML-% IJ SOLN
750.0000 [IU]/h | INTRAMUSCULAR | Status: DC
  Administered 2013-01-17: 750 [IU]/h via INTRAVENOUS
  Filled 2013-01-17: qty 250

## 2013-01-17 MED ORDER — NITROGLYCERIN 2 % TD OINT
1.0000 [in_us] | TOPICAL_OINTMENT | Freq: Once | TRANSDERMAL | Status: AC
  Administered 2013-01-17: 1 [in_us] via TOPICAL
  Filled 2013-01-17: qty 1

## 2013-01-17 MED ORDER — ASPIRIN EC 81 MG PO TBEC
81.0000 mg | DELAYED_RELEASE_TABLET | Freq: Every day | ORAL | Status: DC
  Administered 2013-01-18 – 2013-01-20 (×3): 81 mg via ORAL
  Filled 2013-01-17 (×3): qty 1

## 2013-01-17 MED ORDER — MORPHINE SULFATE 4 MG/ML IJ SOLN
4.0000 mg | Freq: Once | INTRAMUSCULAR | Status: AC
  Administered 2013-01-17: 4 mg via INTRAVENOUS
  Filled 2013-01-17: qty 1

## 2013-01-17 MED ORDER — ONDANSETRON HCL 4 MG/2ML IJ SOLN: 4.0000 mg | Freq: Three times a day (TID) | INTRAMUSCULAR | Status: DC | PRN

## 2013-01-17 MED ORDER — NICOTINE 14 MG/24HR TD PT24
14.0000 mg | MEDICATED_PATCH | Freq: Every day | TRANSDERMAL | Status: DC
  Administered 2013-01-18 – 2013-01-20 (×4): 14 mg via TRANSDERMAL
  Filled 2013-01-17 (×4): qty 1

## 2013-01-17 MED ORDER — ONDANSETRON HCL 4 MG/2ML IJ SOLN
4.0000 mg | Freq: Four times a day (QID) | INTRAMUSCULAR | Status: DC | PRN
  Administered 2013-01-18: 4 mg via INTRAVENOUS
  Filled 2013-01-17: qty 2

## 2013-01-17 MED ORDER — FUROSEMIDE 10 MG/ML IJ SOLN
20.0000 mg | Freq: Once | INTRAMUSCULAR | Status: AC
  Administered 2013-01-18: 20 mg via INTRAVENOUS

## 2013-01-17 MED ORDER — NITROGLYCERIN 0.4 MG SL SUBL
0.4000 mg | SUBLINGUAL_TABLET | SUBLINGUAL | Status: AC
  Administered 2013-01-17 (×3): 0.4 mg via SUBLINGUAL
  Filled 2013-01-17: qty 25

## 2013-01-17 MED ORDER — NITROGLYCERIN 0.4 MG SL SUBL
0.4000 mg | SUBLINGUAL_TABLET | SUBLINGUAL | Status: DC | PRN
  Administered 2013-01-18 (×3): 0.4 mg via SUBLINGUAL
  Filled 2013-01-17: qty 25

## 2013-01-17 MED ORDER — TICAGRELOR 90 MG PO TABS
90.0000 mg | ORAL_TABLET | Freq: Two times a day (BID) | ORAL | Status: DC
  Administered 2013-01-18 – 2013-01-19 (×4): 90 mg via ORAL
  Filled 2013-01-17 (×6): qty 1

## 2013-01-17 MED ORDER — ATORVASTATIN CALCIUM 80 MG PO TABS
80.0000 mg | ORAL_TABLET | Freq: Every day | ORAL | Status: DC
  Administered 2013-01-18 – 2013-01-20 (×3): 80 mg via ORAL
  Filled 2013-01-17 (×4): qty 1

## 2013-01-17 MED ORDER — METOPROLOL SUCCINATE ER 25 MG PO TB24
25.0000 mg | ORAL_TABLET | Freq: Every day | ORAL | Status: DC
  Administered 2013-01-18 – 2013-01-20 (×3): 25 mg via ORAL
  Filled 2013-01-17 (×3): qty 1

## 2013-01-17 MED ORDER — LISINOPRIL 5 MG PO TABS
5.0000 mg | ORAL_TABLET | Freq: Every day | ORAL | Status: DC
  Administered 2013-01-18 – 2013-01-20 (×3): 5 mg via ORAL
  Filled 2013-01-17 (×3): qty 1

## 2013-01-17 NOTE — ED Notes (Signed)
Pt. C/o left sided chest pain radiating to bilateral arms. States pain is burning. Pt. reports hx of chest pain but states "this time it is different". Pt. Tearful at bedside stating "I just want the pain to stop".

## 2013-01-17 NOTE — H&P (Addendum)
PCP:   No PCP Per Patient   Chief Complaint:  Chest pain  HPI:  Patient was brought to the ED by EMS with chest pain, chest pain was described a sharp, sudden onset, substernal 10 out of 10 in severity associate with nausea shortness of breath and diaphoresis, pain was worse with any amount of exertion, patient reported that she never had that sort of chest pain before patient was given nitroglycerin, aspirin in the EMS as well as was started on nitroglycerin drip per my request in the emergency department. She reported significant improvement of her chest pain. However continued with 5/10 chest pressure at the time of my evaluation. I increased her nitroglycerin drip to 200 mcg/per minutes with almost complete resolution of chest pain  Patient reported shortness of breath when I put her in supine position  Patient denied active symptoms of lower extremity edema, frequent or prolonged palpitations, lower extremity edema, nausea vomiting.  Review of Systems:  12 systems were reviewed and were negative except mentioned in the history of present illness  Past Medical History  Diagnosis Date  . Hypertension   . Stroke     tia with no deficits  . CAD (coronary artery disease) 11/13/2011    50% ? proximal LCx stenosis per Cath at [facility]  . PVD (peripheral vascular disease)     Right CFA stenosis per report on 11/14/2011  . HLD (hyperlipidemia)   . LBBB (left bundle branch block)     Past Surgical History  Procedure Laterality Date  . Cardiac catheterization    . Tubal ligation    . Appendectomy      Medications: Prior to Admission medications   Medication Sig Start Date End Date Taking? Authorizing Provider  aspirin 81 MG tablet Take 1 tablet (81 mg total) by mouth daily. 10/08/12   Christiane Ha, MD  atorvastatin (LIPITOR) 80 MG tablet Take 1 tablet (80 mg total) by mouth daily. 10/08/12   Christiane Ha, MD  lisinopril (PRINIVIL,ZESTRIL) 5 MG tablet Take 5 mg by mouth daily.     Historical Provider, MD  metoprolol succinate (TOPROL XL) 25 MG 24 hr tablet Take 1 tablet (25 mg total) by mouth daily. 10/08/12   Christiane Ha, MD    Allergies:   Allergies  Allergen Reactions  . Penicillins Rash    Social History:  reports that she has been smoking.  She has never used smokeless tobacco. She reports that she does not drink alcohol or use illicit drugs.   Family History: Family History  Problem Relation Age of Onset  . CAD Mother   . Lung cancer Mother   . Bladder Cancer Mother   . Stroke Mother   . CAD Father    mother had MI age of 58  PHYSICAL EXAM:  Filed Vitals:   01/17/13 2115 01/17/13 2145 01/17/13 2215 01/17/13 2230  BP: 127/80 144/83 143/80 142/88  Pulse: 72 81 79 77  Temp:      TempSrc:      Resp: 12 15 14 11   Height:      Weight:      SpO2: 99% 98% 98% 99%   General:  Well appearing. No acute distress HEENT: normal Neck: supple. no JVD. Carotids 2+ bilat; no bruits. No lymphadenopathy or thryomegaly appreciated. Cor: PMI nondisplaced. Regular rate & rhythm. No rubs, gallops or murmurs, no JVD, no peripheral edema Lungs: clear to auscultation bilaterally, patient reported shortness of breath when I put her in supine  position Abdomen: soft, nontender, nondistended. No hepatosplenomegaly. No bruits or masses. Good bowel sounds. Extremities: no cyanosis, clubbing, rash, edema Neuro: alert & oriented x 3, cranial nerves grossly intact. moves all 4 extremities w/o difficulty. Affect pleasant.  Labs on Admission:   Recent Labs  01/17/13 1957  NA 140  K 3.4*  CL 103  CO2 23  GLUCOSE 94  BUN 9  CREATININE 0.65  CALCIUM 8.7   No results found for this basename: AST, ALT, ALKPHOS, BILITOT, PROT, ALBUMIN,  in the last 72 hours No results found for this basename: LIPASE, AMYLASE,  in the last 72 hours  Recent Labs  01/17/13 1957  WBC 8.3  NEUTROABS 5.7  HGB 14.2  HCT 41.8  MCV 88.0  PLT 202    Recent Labs   01/17/13 2032  TROPONINI 3.66*   No results found for this basename: TSH, T4TOTAL, FREET3, T3FREE, THYROIDAB,  in the last 72 hours No results found for this basename: VITAMINB12, FOLATE, FERRITIN, TIBC, IRON, RETICCTPCT,  in the last 72 hours  Radiological Exams on Admission (all images were personally reviewed and interpreted by me, radiology reports were reviewed as well): Dg Chest Portable 1 View  01/17/2013   CLINICAL DATA:  Chest pain.  EXAM: PORTABLE CHEST - 1 VIEW  COMPARISON:  Chest x-ray 10/08/2012.  FINDINGS: Lung volumes are normal. No consolidative airspace disease. No pleural effusions. No pneumothorax. No pulmonary nodule or mass noted. Pulmonary vasculature and the cardiomediastinal silhouette are within normal limits.  IMPRESSION: 1.  No radiographic evidence of acute cardiopulmonary disease.   Electronically Signed   By: Trudie Reed M.D.   On: 01/17/2013 20:30   Left heart catheterization from 11/13/2011 images were personally reviewed by (through [facility] cath lab system): Right dominant circulation, however it appeared that there was possibly spasm on the catheter during RCA engagement, short left main, LAD without significant disease, large left circumflex artery proximal circumflex stenosis, according to the reports 50% stenosis and medical management was recommended however in some views stenosis looks more significant and may be up to 80%.  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    Assessment/Plan Patient presented with non-ST elevation myocardial infarction, she had her last heart catheterization done at [facility] on 11/13/2011 at that time it showed proximal left circumflex lesion reviewed  her cath films it appears that in some views lesion might have been more significant that it was thought at the time of catheterization. Currently she is typical symptoms significant troponin elevation. She is almost chest pain-free with minimal residual chest pressure. Will continue optimize her medical therapy for chest pain doesn't resolve completely in the near future or her troponin showed significant up trend we may consider activate the Cath Lab  Non-STEMI Aspirin, heparin drip, patient was loaded with brilinta as her anatomy is essentially known Lipitor 80 mg Metoprolol 25 mg twice a day Nitroglycerin currently in 200 mics per minute  Acute likely diastolic CHF, associate with ischemia Echocardiogram in the morning Continue nitroglycerin drip Lasix 20 mg IV x1  Hyperlipidemia Continue Lipitor 80 mg daily  Hypertension Lisinopril and metoprolol at home doses  Tobacco abuse Nicotine patch   Patient initially improved with nitroglycerin drip at 200 mcg per  minute, however about 2-3 hours later she was put in supine position to obtain electrocardiogram. Patient at that time developed a recurrence of chest pressure 6-7/10 in severity that partially improved with morphine. Giving ongoing chest pain despite maximal medical therapy Level was activated all high risk and NSTEMI patient underwent proximal left circumflex stenting with enzymes L. Pyne T5 by 28 mm was dilated to 3.0 mm. Please see cardiac catheterization report. Patient was brought chest pain-free back to her room.  Critical care time 90 minutes

## 2013-01-17 NOTE — ED Provider Notes (Signed)
CSN: [id number]     Arrival date & time 01/17/13  1936 History   First MD Initiated Contact with Patient 01/17/13 1943     Chief Complaint  Patient presents with  . Chest Pain   (Consider location/radiation/quality/duration/timing/severity/associated sxs/prior Treatment) Patient is a 48 y.o. female presenting with chest pain. The history is provided by the patient. No language interpreter was used.  Chest Pain Pain location:  Substernal area Pain quality: aching and burning   Pain radiates to:  L arm Pain radiates to the back: no   Pain severity:  Moderate Onset quality:  Sudden Duration:  1 hour Timing:  Intermittent Progression:  Waxing and waning Chronicity:  New Context: at rest   Relieved by:  Nothing Worsened by:  Nothing tried Ineffective treatments:  None tried Associated symptoms: diaphoresis, nausea, numbness and shortness of breath   Associated symptoms: no abdominal pain, no back pain, no cough, no fatigue, no fever, no headache, no palpitations, not vomiting and no weakness   Risk factors: coronary artery disease, high cholesterol, hypertension and smoking   Risk factors: not female and not obese     Past Medical History  Diagnosis Date  . Hypertension   . Stroke     tia with no deficits  . CAD (coronary artery disease) 11/13/2011    50% ? proximal LCx stenosis per Cath at [facility]  . PVD (peripheral vascular disease)     Right CFA stenosis per report on 11/14/2011  . HLD (hyperlipidemia)   . LBBB (left bundle branch block)   . Acute myocardial infarction of other lateral wall, initial episode of care    Past Surgical History  Procedure Laterality Date  . Cardiac catheterization    . Tubal ligation    . Appendectomy     Family History  Problem Relation Age of Onset  . CAD Mother 58  . Lung cancer Mother   . Bladder Cancer Mother   . Stroke Mother   . CAD Father 35   History  Substance Use Topics  . Smoking status: Current Every Day Smoker -- 0.50  packs/day  . Smokeless tobacco: Never Used  . Alcohol Use: No   OB History   Grav Para Term Preterm Abortions TAB SAB Ect Mult Living                 Review of Systems  Constitutional: Positive for diaphoresis. Negative for fever, chills, activity change, appetite change and fatigue.  HENT: Negative for congestion, facial swelling, rhinorrhea and sore throat.   Eyes: Negative for photophobia and discharge.  Respiratory: Positive for shortness of breath. Negative for cough and chest tightness.   Cardiovascular: Positive for chest pain. Negative for palpitations and leg swelling.  Gastrointestinal: Positive for nausea. Negative for vomiting, abdominal pain and diarrhea.  Endocrine: Negative for polydipsia and polyuria.  Genitourinary: Negative for dysuria, frequency, difficulty urinating and pelvic pain.  Musculoskeletal: Negative for arthralgias, back pain, neck pain and neck stiffness.  Skin: Negative for color change and wound.  Allergic/Immunologic: Negative for immunocompromised state.  Neurological: Positive for numbness. Negative for facial asymmetry, weakness and headaches.  Hematological: Does not bruise/bleed easily.  Psychiatric/Behavioral: Negative for confusion and agitation.    Allergies  Penicillins  Home Medications   No current outpatient prescriptions on file. BP 105/84  Pulse 86  Temp(Src) 98.3 F (36.8 C) (Oral)  Resp 37  Ht 5\' 2"  (1.575 m)  Wt 125 lb 10.6 oz (57 kg)  BMI 22.98 kg/m2  SpO2 100% Physical Exam  Constitutional: She is oriented to person, place, and time. She appears well-developed and well-nourished. No distress.  HENT:  Head: Normocephalic and atraumatic.  Mouth/Throat: No oropharyngeal exudate.  Eyes: Pupils are equal, round, and reactive to light.  Neck: Normal range of motion. Neck supple.  Cardiovascular: Normal rate, regular rhythm and normal heart sounds.  Exam reveals no gallop and no friction rub.   No murmur  heard. Pulmonary/Chest: Effort normal and breath sounds normal. No respiratory distress. She has no wheezes. She has no rales. She exhibits tenderness.  Abdominal: Soft. Bowel sounds are normal. She exhibits no distension and no mass. There is no tenderness. There is no rebound and no guarding.  Musculoskeletal: Normal range of motion. She exhibits no edema and no tenderness.  Neurological: She is alert and oriented to person, place, and time.  Skin: Skin is warm and dry.  Psychiatric: She has a normal mood and affect.    ED Course  Procedures (including critical care time) Labs Review Labs Reviewed  BASIC METABOLIC PANEL - Abnormal; Notable for the following:    Potassium 3.4 (*)    All other components within normal limits  PRO B NATRIURETIC PEPTIDE - Abnormal; Notable for the following:    Pro B Natriuretic peptide (BNP) 4026.0 (*)    All other components within normal limits  TROPONIN I - Abnormal; Notable for the following:    Troponin I 3.66 (*)    All other components within normal limits  CBC - Abnormal; Notable for the following:    WBC 11.1 (*)    All other components within normal limits  TROPONIN I - Abnormal; Notable for the following:    Troponin I 3.74 (*)    All other components within normal limits  TROPONIN I - Abnormal; Notable for the following:    Troponin I 5.55 (*)    All other components within normal limits  BASIC METABOLIC PANEL - Abnormal; Notable for the following:    Potassium 3.2 (*)    All other components within normal limits  POCT I-STAT TROPONIN I - Abnormal; Notable for the following:    Troponin i, poc 1.97 (*)    All other components within normal limits  MRSA PCR SCREENING  CBC WITH DIFFERENTIAL  TROPONIN I  CBC   Imaging Review Dg Chest Portable 1 View  01/17/2013   CLINICAL DATA:  Chest pain.  EXAM: PORTABLE CHEST - 1 VIEW  COMPARISON:  Chest x-ray 10/08/2012.  FINDINGS: Lung volumes are normal. No consolidative airspace disease. No  pleural effusions. No pneumothorax. No pulmonary nodule or mass noted. Pulmonary vasculature and the cardiomediastinal silhouette are within normal limits.  IMPRESSION: 1.  No radiographic evidence of acute cardiopulmonary disease.   Electronically Signed   By: Trudie Reed M.D.   On: 01/17/2013 20:30    EKG Interpretation    Date/Time:  Friday January 17 2013 19:52:28 EST Ventricular Rate:  90 PR Interval:  136 QRS Duration: 161 QT Interval:  477 QTC Calculation: 584 R Axis:   48 Text Interpretation:  Sinus rhythm Left bundle branch block HEART RATE INCREASED SINCE Since last tracing Confirmed by DOCHERTY  MD, [name] [phone]) on 01/17/2013 7:58:33 PM          CRITICAL CARE Performed by: Toy Cookey, E Total critical care time: 30 Critical care time was exclusive of separately billable procedures and treating other patients. Critical care was necessary to treat or prevent imminent or life-threatening deterioration. Critical  care was time spent personally by me on the following activities: development of treatment plan with patient and/or surrogate as well as nursing, discussions with consultants, evaluation of patient's response to treatment, examination of patient, obtaining history from patient or surrogate, ordering and performing treatments and interventions, ordering and review of laboratory studies, ordering and review of radiographic studies, pulse oximetry and re-evaluation of patient's condition.   MDM   1. NSTEMI (non-ST elevated myocardial infarction)   2. Tobacco abuse   3. Acute myocardial infarction of other lateral wall, initial episode of care    Pt is a 48 y.o. female with Pmhx as above who presents with about 1 hr of intermittent CP with assoc SOB, nausea, radiation to L arm.  No aggravating or alleviating symptoms. On PE, VSS, pt in NAD.  EKG grossly unchanged from prior. ASA given by EMS. 2SL NTG given here improvement of pain from 8/10-2/10.  Trop elevated  c/w NSTEMI. Given pt still having some CP, NTG started as well as heparin and pt admitted to step down under cardiology service.         Shanna CiscoMegan E Docherty, MD 01/18/13 1122

## 2013-01-17 NOTE — ED Notes (Signed)
Presents with onset of burning sternal chest pain reproducable with touch, associated with nausea, diaphoresis, SOB. Pain initially rated 10/10 decreased to 5/10 after 0.4 SL nitro. 324 ASA given in route. LBB noted on monitor, pt tearful. VSS, reports 25% blockage from a previous cardiac cath.

## 2013-01-17 NOTE — Progress Notes (Signed)
ANTICOAGULATION CONSULT NOTE - Initial Consult  Pharmacy Consult for heparin Indication: chest pain/ACS  Allergies  Allergen Reactions  . Penicillins Rash    Patient Measurements: Height: 5' 1.42" (156 cm) Weight: 121 lb 4.1 oz (55 kg) IBW/kg (Calculated) : 48.76 Heparin Dosing Weight: 55kg  Vital Signs: Temp: 97.7 F (36.5 C) (01/16 1947) Temp src: Oral (01/16 1947) BP: 131/75 mmHg (01/16 2045) Pulse Rate: 93 (01/16 2045)  Labs:  Recent Labs  01/17/13 1957  HGB 14.2  HCT 41.8  PLT 202    Estimated Creatinine Clearance: 67 ml/min (by C-G formula based on Cr of 0.7).   Medical History: Past Medical History  Diagnosis Date  . Hypertension   . Stroke     tia with no deficits   Assessment: 48 year old female presents to [facility] with chest pain radiating to bilateral arms. Not on anticoagulants prior to admission. Cardiac enzymes are positive, orders to start heparin protocol. CBC within normal limits.  Goal of Therapy:  Heparin level 0.3-0.7 units/ml Monitor platelets by anticoagulation protocol: Yes   Plan:  Give 3000 units bolus x 1 Start heparin infusion at 750 units/hr Check anti-Xa level in 6 hours and daily while on heparin Continue to monitor H&H and platelets  Severiano Gilbert 01/17/2013,9:00 PM

## 2013-01-17 NOTE — ED Notes (Signed)
Dr Micheline Maze given a copy of troponin results 1.97

## 2013-01-18 ENCOUNTER — Encounter (HOSPITAL_COMMUNITY): Payer: Self-pay | Admitting: Interventional Cardiology

## 2013-01-18 ENCOUNTER — Encounter (HOSPITAL_COMMUNITY)
Admission: EM | Disposition: A | Discharge status: Home / Self Care | Payer: Self-pay | Source: Home / Self Care | Attending: Cardiovascular Disease

## 2013-01-18 ENCOUNTER — Ambulatory Visit (HOSPITAL_COMMUNITY): Admit: 2013-01-18 | Payer: Self-pay | Admitting: Interventional Cardiology

## 2013-01-18 DIAGNOSIS — I2129 ST elevation (STEMI) myocardial infarction involving other sites: Secondary | ICD-10-CM

## 2013-01-18 DIAGNOSIS — I509 Heart failure, unspecified: Secondary | ICD-10-CM

## 2013-01-18 DIAGNOSIS — I447 Left bundle-branch block, unspecified: Secondary | ICD-10-CM

## 2013-01-18 DIAGNOSIS — I5041 Acute combined systolic (congestive) and diastolic (congestive) heart failure: Secondary | ICD-10-CM

## 2013-01-18 DIAGNOSIS — I214 Non-ST elevation (NSTEMI) myocardial infarction: Principal | ICD-10-CM

## 2013-01-18 DIAGNOSIS — I2510 Atherosclerotic heart disease of native coronary artery without angina pectoris: Secondary | ICD-10-CM

## 2013-01-18 DIAGNOSIS — I059 Rheumatic mitral valve disease, unspecified: Secondary | ICD-10-CM

## 2013-01-18 DIAGNOSIS — I10 Essential (primary) hypertension: Secondary | ICD-10-CM

## 2013-01-18 HISTORY — PX: LEFT HEART CATHETERIZATION WITH CORONARY ANGIOGRAM: SHX5451

## 2013-01-18 HISTORY — PX: PERCUTANEOUS CORONARY STENT INTERVENTION (PCI-S): SHX5485

## 2013-01-18 LAB — BASIC METABOLIC PANEL
BUN: 7 mg/dL (ref 6–23)
CHLORIDE: 103 meq/L (ref 96–112)
CO2: 23 meq/L (ref 19–32)
Calcium: 8.4 mg/dL (ref 8.4–10.5)
Creatinine, Ser: 0.64 mg/dL (ref 0.50–1.10)
GFR calc non Af Amer: >90 mL/min (ref >90)
Glucose, Bld: 97 mg/dL (ref 70–99)
Potassium: 3.2 mEq/L — ABNORMAL LOW (ref 3.7–5.3)
Sodium: 140 mEq/L (ref 137–147)

## 2013-01-18 LAB — CBC
HEMATOCRIT: 39.2 % (ref 36.0–46.0)
HEMATOCRIT: 37.8 % (ref 36.0–46.0)
HEMOGLOBIN: 12.8 g/dL (ref 12.0–15.0)
Hemoglobin: 13.4 g/dL (ref 12.0–15.0)
MCH: 29.5 pg (ref 26.0–34.0)
MCH: 29.4 pg (ref 26.0–34.0)
MCHC: 34.2 g/dL (ref 30.0–36.0)
MCHC: 33.9 g/dL (ref 30.0–36.0)
MCV: 86.3 fL (ref 78.0–100.0)
MCV: 86.9 fL (ref 78.0–100.0)
Platelets: 191 10*3/uL (ref 150–400)
Platelets: 165 10*3/uL (ref 150–400)
RBC: 4.54 MIL/uL (ref 3.87–5.11)
RBC: 4.35 MIL/uL (ref 3.87–5.11)
RDW: 13.7 % (ref 11.5–15.5)
RDW: 13.7 % (ref 11.5–15.5)
WBC: 11.1 10*3/uL — ABNORMAL HIGH (ref 4.0–10.5)
WBC: 7 10*3/uL (ref 4.0–10.5)

## 2013-01-18 LAB — TROPONIN I
TROPONIN I: 5.28 ng/mL — AB (ref <0.30)
Troponin I: 3.74 ng/mL (ref <0.30)
Troponin I: 5.54 ng/mL (ref <0.30)
Troponin I: 5.55 ng/mL (ref <0.30)

## 2013-01-18 LAB — MRSA PCR SCREENING: MRSA by PCR: NEGATIVE

## 2013-01-18 SURGERY — LEFT HEART CATHETERIZATION WITH CORONARY ANGIOGRAM
Anesthesia: LOCAL

## 2013-01-18 MED ORDER — NITROGLYCERIN 0.2 MG/ML ON CALL CATH LAB
INTRAVENOUS | Status: AC
  Filled 2013-01-18: qty 1

## 2013-01-18 MED ORDER — FUROSEMIDE 10 MG/ML IJ SOLN
INTRAMUSCULAR | Status: AC
  Filled 2013-01-18: qty 4

## 2013-01-18 MED ORDER — ASPIRIN 81 MG PO CHEW
81.0000 mg | CHEWABLE_TABLET | Freq: Every day | ORAL | Status: DC
  Filled 2013-01-18: qty 1

## 2013-01-18 MED ORDER — ACETAMINOPHEN 500 MG PO TABS
1000.0000 mg | ORAL_TABLET | Freq: Three times a day (TID) | ORAL | Status: DC
  Administered 2013-01-18 – 2013-01-20 (×6): 1000 mg via ORAL
  Filled 2013-01-18 (×10): qty 2

## 2013-01-18 MED ORDER — LIDOCAINE HCL (PF) 1 % IJ SOLN
INTRAMUSCULAR | Status: AC
  Filled 2013-01-18: qty 30

## 2013-01-18 MED ORDER — VERAPAMIL HCL 2.5 MG/ML IV SOLN
INTRAVENOUS | Status: AC
  Filled 2013-01-18: qty 2

## 2013-01-18 MED ORDER — ONDANSETRON HCL 4 MG/2ML IJ SOLN
4.0000 mg | Freq: Four times a day (QID) | INTRAMUSCULAR | Status: DC | PRN
Start: 2013-01-18 — End: 2013-01-20
  Administered 2013-01-19 – 2013-01-20 (×3): 4 mg via INTRAVENOUS
  Filled 2013-01-18 (×3): qty 2

## 2013-01-18 MED ORDER — HEPARIN SODIUM (PORCINE) 1000 UNIT/ML IJ SOLN
INTRAMUSCULAR | Status: AC
  Filled 2013-01-18: qty 1

## 2013-01-18 MED ORDER — MIDAZOLAM HCL 2 MG/2ML IJ SOLN
INTRAMUSCULAR | Status: AC
Start: 2013-01-18 — End: 2013-01-18
  Filled 2013-01-18: qty 2

## 2013-01-18 MED ORDER — MIDAZOLAM HCL 2 MG/2ML IJ SOLN
INTRAMUSCULAR | Status: AC
  Filled 2013-01-18: qty 2

## 2013-01-18 MED ORDER — FENTANYL CITRATE 0.05 MG/ML IJ SOLN
INTRAMUSCULAR | Status: AC
  Filled 2013-01-18: qty 2

## 2013-01-18 MED ORDER — ACETAMINOPHEN 325 MG PO TABS: 650.0000 mg | ORAL_TABLET | ORAL | Status: DC | PRN

## 2013-01-18 MED ORDER — TIROFIBAN HCL IV 5 MG/100ML
INTRAVENOUS | Status: AC
  Administered 2013-01-18: 0.15 ug/kg/min via INTRAVENOUS
  Filled 2013-01-18: qty 100

## 2013-01-18 MED ORDER — PANTOPRAZOLE SODIUM 40 MG PO TBEC: 40.0000 mg | DELAYED_RELEASE_TABLET | Freq: Once | ORAL | Status: DC

## 2013-01-18 MED ORDER — MORPHINE SULFATE 2 MG/ML IJ SOLN
INTRAMUSCULAR | Status: AC
  Filled 2013-01-18: qty 1

## 2013-01-18 MED ORDER — MORPHINE SULFATE 2 MG/ML IJ SOLN
1.0000 mg | INTRAMUSCULAR | Status: DC | PRN
  Administered 2013-01-18 (×3): 2 mg via INTRAVENOUS
  Filled 2013-01-18 (×2): qty 1

## 2013-01-18 MED ORDER — SODIUM CHLORIDE 0.9 % IV SOLN
INTRAVENOUS | Status: AC
Start: 2013-01-18 — End: 2013-01-18

## 2013-01-18 MED ORDER — OXYCODONE-ACETAMINOPHEN 5-325 MG PO TABS
1.0000 | ORAL_TABLET | ORAL | Status: DC | PRN
  Administered 2013-01-18 – 2013-01-20 (×7): 1 via ORAL
  Filled 2013-01-18 (×7): qty 1

## 2013-01-18 MED ORDER — MORPHINE SULFATE 2 MG/ML IJ SOLN: 2.0000 mg | INTRAMUSCULAR | Status: DC | PRN

## 2013-01-18 MED ORDER — HEPARIN (PORCINE) IN NACL 2-0.9 UNIT/ML-% IJ SOLN
INTRAMUSCULAR | Status: AC
  Filled 2013-01-18: qty 1000

## 2013-01-18 MED ORDER — MORPHINE SULFATE 2 MG/ML IJ SOLN
2.0000 mg | Freq: Once | INTRAMUSCULAR | Status: DC
  Filled 2013-01-18: qty 1

## 2013-01-18 MED ORDER — TIROFIBAN HCL IV 5 MG/100ML
0.1500 ug/kg/min | INTRAVENOUS | Status: AC
  Administered 2013-01-18 (×2): 0.15 ug/kg/min via INTRAVENOUS
  Filled 2013-01-18: qty 100

## 2013-01-18 MED ORDER — PANTOPRAZOLE SODIUM 40 MG PO TBEC
40.0000 mg | DELAYED_RELEASE_TABLET | Freq: Every day | ORAL | Status: DC
  Administered 2013-01-18 – 2013-01-20 (×3): 40 mg via ORAL
  Filled 2013-01-18 (×3): qty 1

## 2013-01-18 MED ORDER — TICAGRELOR 90 MG PO TABS
90.0000 mg | ORAL_TABLET | Freq: Two times a day (BID) | ORAL | Status: DC
Start: 2013-01-18 — End: 2013-01-18
  Filled 2013-01-18 (×2): qty 1

## 2013-01-18 NOTE — Progress Notes (Addendum)
Patient transferred to floor from 2H19. Patient stated she had no pain when she arrived. About 1 hour later started complaining of CP of 6/10. Gave patient 1 mg morphine and then 3 SL Nitro with no relief. HR 73 BP 89/60 NSR with BBB on the monitor. Patient states that she has been running low with her BP. Paged MD on call to notify of patient's CP. Gave 2 mg of Morphine as ordered PRN Q1 for pain 6/10 HR 72 BP 88/52. Will continue to monitor closely. Lajuana Matte, RN

## 2013-01-18 NOTE — Progress Notes (Signed)
Pt and  Sister aware of pending tx to room 2W 15

## 2013-01-18 NOTE — Progress Notes (Signed)
CARDIAC REHAB PHASE I   PRE:  Rate/Rhythm: 80 sinus rhythm, LBBB  BP:  Supine:  90/60 Sitting: 103/50  Standing:    SaO2: 97% ra  MODE:  Ambulation: 170 ft   POST:  Rate/Rhythem: 79 sinus rhythm,L BBB, PACs  BP:  Supine:   Sitting: 115/88  Standing:    SaO2: 97%ra 1150-1225 Pt ambulated in hallway x1 assist using rolling walker.  Pt had slow gait, 3 standing rest breaks and constant reminders to keep head up and eyes open.  Pt responds appropriately.  Pt c/o fatigue and weakness.  Denies pain or dyspnea.  Pt returned to bed, call light in reach.    [place], [place]

## 2013-01-18 NOTE — Progress Notes (Signed)
MEDICATION RELATED CONSULT NOTE - INITIAL   Pharmacy Consult for aggrastat Indication: to continue 8 hours post PCI  Allergies  Allergen Reactions  . Penicillins Rash    Patient Measurements: Height: 5\' 2"  (157.5 cm) Weight: 127 lb 10.3 oz (57.9 kg) IBW/kg (Calculated) : 50.1 Adjusted Body Weight:   Vital Signs: Temp: 97.6 F (36.4 C) (01/16 2300) Temp src: Oral (01/16 2300) BP: 94/49 mmHg (01/17 0247) Pulse Rate: 86 (01/17 0309) Intake/Output from previous day: 01/16 0701 - 01/17 0700 In: 71 [I.V.:71] Out: 450 [Urine:450] Intake/Output from this shift: Total I/O In: 71 [I.V.:71] Out: 450 [Urine:450]  Labs:  Recent Labs  01/17/13 1957  WBC 8.3  HGB 14.2  HCT 41.8  PLT 202  CREATININE 0.65   Estimated Creatinine Clearance: 68.8 ml/min (by C-G formula based on Cr of 0.65).   Microbiology: Recent Results (from the past 720 hour(s))  MRSA PCR SCREENING     Status: None   Collection Time    01/17/13 10:59 PM      Result Value Range Status   MRSA by PCR NEGATIVE  NEGATIVE Final   Comment:            The GeneXpert MRSA Assay (FDA     approved for NASAL specimens     only), is one component of a     comprehensive MRSA colonization     surveillance program. It is not     intended to diagnose MRSA     infection nor to guide or     monitor treatment for     MRSA infections.    Medical History: Past Medical History  Diagnosis Date  . Hypertension   . Stroke     tia with no deficits  . CAD (coronary artery disease) 11/13/2011    50% ? proximal LCx stenosis per Cath at [facility]  . PVD (peripheral vascular disease)     Right CFA stenosis per report on 11/14/2011  . HLD (hyperlipidemia)   . LBBB (left bundle branch block)   . Acute myocardial infarction of other lateral wall, initial episode of care     Medications:  Prescriptions prior to admission  Medication Sig Dispense Refill  . aspirin 81 MG tablet Take 1 tablet (81 mg total) by mouth daily.  30  tablet    . atorvastatin (LIPITOR) 80 MG tablet Take 1 tablet (80 mg total) by mouth daily.  30 tablet  0  . lisinopril (PRINIVIL,ZESTRIL) 5 MG tablet Take 5 mg by mouth daily.      . metoprolol succinate (TOPROL XL) 25 MG 24 hr tablet Take 1 tablet (25 mg total) by mouth daily.  30 tablet  0    Assessment: Post cath. Patent LAD. DES placed proximal left circ artery. Continue infusion x8 hrs. PO load of Brilinta received in cath lab.  Goal of Therapy:   Plan:  Continue aggrastat at 0.15 mcg/kg/min x8 hours.   Janice Coffin 01/18/2013,5:10 AM

## 2013-01-18 NOTE — Progress Notes (Signed)
Pt is complaining of a 6/10 chest pain unrelieved with high dose nitroglycerin infusion. Ekg shows a sinus rhythm with left bundle branch block which is unchanged from EKG done in the ED. MD  Came in to evaluate patient and initiated a code STEMI.Pt prepared for cath lab.

## 2013-01-18 NOTE — Significant Event (Signed)
Paged by RN, RE: patient has chest pain 6/10, not responding to Nitro and Morphine  BS evaluation: chest wall pain worse with chest wall palpation and deep inspiration. Different from CP before cath. Patient is anxious.   ECG: LBBB old without any new significant changes  Troponin: down trending to 5.28 from 5.54 earlier today  Patient is s/p PCI to Lcx with good results  BP post morphine 89/60 with HR 73, Recheck BP 103/86 HR 69.   A/P: Pleuritic chest pain without evidence or recurrent ischemia  1. Continue DAPT 2. Percocet and anti-inflammatory meds 3. Continue to monitor

## 2013-01-18 NOTE — CV Procedure (Signed)
PROCEDURE:  Left heart catheterization with selective coronary angiography, left ventriculogram.  PCI proximal circumflex  INDICATIONS:  NSTEMI  The risks, benefits, and details of the procedure were explained to the patient.  The patient verbalized understanding and wanted to proceed.  Informed written consent was obtained.  PROCEDURE TECHNIQUE:  After Xylocaine anesthesia a 85F slender sheath was placed in the right radial artery with a single anterior needle wall stick.   Right coronary angiography was done using a Judkins R4 guide catheter.  Left coronary angiography was done using a Judkins L3.5 guide catheter.  Left ventriculography was done using a pigtail catheter.  A TR band was used for hemostasis.   CONTRAST:  Total of 135 cc.  COMPLICATIONS:  None.    HEMODYNAMICS:  Aortic pressure was 133/74; LV pressure was 143/10; LVEDP 16.  There was no gradient between the left ventricle and aorta.    ANGIOGRAPHIC DATA:   The left main coronary artery is widely patent.  The left anterior descending artery is a large vessel which wraps around the apex. In the mid vessel, there is mild atherosclerosis. The first diagonal is small. There is mild ostial disease. The second diagonal is large and appears widely patent.  The left circumflex artery is a large vessel. The proximal vessel has a 99% stenosis. There is a small OM1 with mild ostial disease. There is a large OM2 originating after the 99% stenosis in was in an and is in a who is a bruits in the with some moderate disease proximally. The remainder of the circumflex is small and widely patent.  The right coronary artery is a large dominant vessel. There is mild atherosclerosis in the mid vessel. The posterior descending artery is large and widely patent. Posterior lateral artery is medium size and widely patent.  LEFT VENTRICULOGRAM:  Left ventricular angiogram was done in the 30 RAO projection and revealed normal left ventricular  wall motion and systolic function with an estimated ejection fraction of 55%.  LVEDP was 16 mmHg.  PCI NARRATIVE: A 5 French EBU 3.0 guiding catheter was used to engage the left main. We saved a 5 [place] because the patient was having significant pain in her arm with catheter exchanges and evidence of mild vasospasm. A pro-water wire was placed across the area disease in the circumflex and into the large OM1. We considered the performing aspiration thrombectomy but there is no catheter that would fit through a 5 [place] guide. A 2.0 x 12 balloon was used to predilate the proximal circumflex lesion. The 99% lesion was very focal but there is moderate diffuse disease past this lesion. A 2.5 x 28 Alpine drug-eluting stent was deployed across the entire area disease in the circumflex and into the OM1. A 3.0 x 20 noncompliant balloon was used to post dilate the stent, inflated to 16 atmospheres. Several doses of intra-coronary nitroglycerin were administered. There is no residual stenosis.  Several doses of nitroglycerin were given to the sheath as well to treat vasospasm. In addition we obtained  IMPRESSIONS:  1. Normal left main coronary artery. 2. Mild disease in the mid left anterior descending artery.  Patent diagonal branches . 3. 99% stenosis in the proximal left circumflex artery with moderate disease extending into the large, OM2 branch.  This is the culprit for today's presentation. This was successfully treated with a 2.5 x 28 Alpine drug-eluting stent, postdilated to greater than 3 mm in diameter. 4. Widely patent right coronary  artery. 5. Normal left ventricular systolic function.  LVEDP 16 mmHg.  Ejection fraction 55 %.  RECOMMENDATION:  She'll be watched in the hospital at least overnight. Continue Aggrastat for 8 hours. She has already received Brilinta.  She'll need aggressive secondary prevention including smoking cessation, beta blocker and statin. I stressed the importance of dual  antiplatelet therapy to prevent stent thrombosis. We will need help from case management to make sure that she can get her antiplatelet agents. If necessary, after a month of free Brilinta, we can switch to clopidogrel if necessary due to cost reasons.

## 2013-01-18 NOTE — Progress Notes (Addendum)
Subjective:  No angina. Mild dyspnea walking around unit - had to stop twice. Mild soreness at radial access site, small ecchymosis  Objective:  Temp:  [97.3 F (36.3 C)-98.3 F (36.8 C)] 98.3 F (36.8 C) (01/17 1152) Pulse Rate:  [72-99] 86 (01/17 0309) Resp:  [10-37] 37 (01/17 0530) BP: (47-148)/(30-93) 91/69 mmHg (01/17 1315) SpO2:  [93 %-100 %] 100 % (01/17 1315) Weight:  [55 kg (121 lb 4.1 oz)-57.9 kg (127 lb 10.3 oz)] 57 kg (125 lb 10.6 oz) (01/17 0500) Weight change:   Intake/Output from previous day: 01/16 0701 - 01/17 0700 In: 332.5 [I.V.:332.5] Out: 930 [Urine:930]  Intake/Output from this shift: Total I/O In: 682 [P.O.:480; I.V.:202] Out: 500 [Urine:500]  Medications: Current Facility-Administered Medications  Medication Dose Route Frequency Provider Last Rate Last Dose  . acetaminophen (TYLENOL) tablet 1,000 mg  1,000 mg Oral Q8H Inez Pilgrim, MD   1,000 mg at 01/18/13 0950  . acetaminophen (TYLENOL) tablet 650 mg  650 mg Oral Q4H PRN Casandra Doffing, MD      . aspirin chewable tablet 81 mg  81 mg Oral Daily Casandra Doffing, MD      . aspirin EC tablet 81 mg  81 mg Oral Daily Inez Pilgrim, MD   81 mg at 01/18/13 0950  . atorvastatin (LIPITOR) tablet 80 mg  80 mg Oral Daily Inez Pilgrim, MD      . lisinopril (PRINIVIL,ZESTRIL) tablet 5 mg  5 mg Oral Daily Inez Pilgrim, MD   5 mg at 01/18/13 0950  . metoprolol succinate (TOPROL-XL) 24 hr tablet 25 mg  25 mg Oral Daily Inez Pilgrim, MD   25 mg at 01/18/13 0950  . morphine 2 MG/ML injection 1-2 mg  1-2 mg Intravenous Q1H PRN Casandra Doffing, MD   2 mg at 01/18/13 0531  . nicotine (NICODERM CQ - dosed in mg/24 hours) patch 14 mg  14 mg Transdermal Daily Inez Pilgrim, MD   14 mg at 01/18/13 0950  . nitroGLYCERIN (NITROSTAT) SL tablet 0.4 mg  0.4 mg Sublingual Q5 Min x 3 PRN Inez Pilgrim, MD      . nitroGLYCERIN 0.2 mg/mL in dextrose 5 % infusion  10-200 mcg/min Intravenous Titrated Neta Ehlers, MD    100 mcg/min at 01/18/13 0248  . ondansetron (ZOFRAN) injection 4 mg  4 mg Intravenous Q6H PRN Casandra Doffing, MD      . pantoprazole (PROTONIX) EC tablet 40 mg  40 mg Oral Q0600 Inez Pilgrim, MD   40 mg at 01/18/13 3893  . Ticagrelor (BRILINTA) tablet 90 mg  90 mg Oral BID Inez Pilgrim, MD   90 mg at 01/18/13 7342    Physical Exam:  General: Alert, oriented x3, no distress,  Head: no evidence of trauma, PERRL, EOMI, no exophtalmos or lid lag, no myxedema, no xanthelasma; normal ears, nose and oropharynx Neck: normal jugular venous pulsations and no hepatojugular reflux; brisk carotid pulses without delay and no carotid bruits Chest: clear to auscultation, no signs of consolidation by percussion or palpation, normal fremitus, symmetrical and full respiratory excursions Cardiovascular: normal position and quality of the apical impulse, regular rhythm, normal first and paradoxically split second heart sound, no murmurs, rubs or gallops Abdomen: no tenderness or distention, no masses by palpation, no abnormal pulsatility or arterial bruits, normal bowel sounds, no hepatosplenomegaly Extremities: no clubbing, cyanosis or edema; 2+ radial, ulnar and brachial pulses bilaterally; 2+ right femoral, posterior tibial and dorsalis pedis pulses; 2+ left femoral, posterior tibial and dorsalis  pedis pulses; no subclavian or femoral bruits Neurological: grossly nonfocal  ECG: NSR, LBBB  Lab Results: Results for orders placed during the hospital encounter of 01/17/13 (from the past 48 hour(s))  CBC WITH DIFFERENTIAL     Status: None   Collection Time    01/17/13  7:57 PM      Result Value Range   WBC 8.3  4.0 - 10.5 K/uL   RBC 4.75  3.87 - 5.11 MIL/uL   Hemoglobin 14.2  12.0 - 15.0 g/dL   HCT 41.8  36.0 - 46.0 %   MCV 88.0  78.0 - 100.0 fL   MCH 29.9  26.0 - 34.0 pg   MCHC 34.0  30.0 - 36.0 g/dL   RDW 13.7  11.5 - 15.5 %   Platelets 202  150 - 400 K/uL   Neutrophils Relative % 69  43 - 77 %    Neutro Abs 5.7  1.7 - 7.7 K/uL   Lymphocytes Relative 21  12 - 46 %   Lymphs Abs 1.8  0.7 - 4.0 K/uL   Monocytes Relative 7  3 - 12 %   Monocytes Absolute 0.6  0.1 - 1.0 K/uL   Eosinophils Relative 3  0 - 5 %   Eosinophils Absolute 0.2  0.0 - 0.7 K/uL   Basophils Relative 0  0 - 1 %   Basophils Absolute 0.0  0.0 - 0.1 K/uL  BASIC METABOLIC PANEL     Status: Abnormal   Collection Time    01/17/13  7:57 PM      Result Value Range   Sodium 140  137 - 147 mEq/L   Potassium 3.4 (*) 3.7 - 5.3 mEq/L   Chloride 103  96 - 112 mEq/L   CO2 23  19 - 32 mEq/L   Glucose, Bld 94  70 - 99 mg/dL   BUN 9  6 - 23 mg/dL   Creatinine, Ser 0.65  0.50 - 1.10 mg/dL   Calcium 8.7  8.4 - 10.5 mg/dL   GFR calc non Af Amer >90  >90 mL/min   GFR calc Af Amer >90  >90 mL/min   Comment: (NOTE)     The eGFR has been calculated using the CKD EPI equation.     This calculation has not been validated in all clinical situations.     eGFR's persistently <90 mL/min signify possible Chronic Kidney     Disease.  PRO B NATRIURETIC PEPTIDE     Status: Abnormal   Collection Time    01/17/13  7:57 PM      Result Value Range   Pro B Natriuretic peptide (BNP) 4026.0 (*) 0 - 125 pg/mL  POCT I-STAT TROPONIN I     Status: Abnormal   Collection Time    01/17/13  8:32 PM      Result Value Range   Troponin i, poc 1.97 (*) 0.00 - 0.08 ng/mL   Comment NOTIFIED PHYSICIAN     Comment 3            Comment: Due to the release kinetics of cTnI,     a negative result within the first hours     of the onset of symptoms does not rule out     myocardial infarction with certainty.     If myocardial infarction is still suspected,     repeat the test at appropriate intervals.  TROPONIN I     Status: Abnormal   Collection Time    01/17/13  8:32 PM      Result Value Range   Troponin I 3.66 (*) <0.30 ng/mL   Comment:            Due to the release kinetics of cTnI,     a negative result within the first hours     of the onset of  symptoms does not rule out     myocardial infarction with certainty.     If myocardial infarction is still suspected,     repeat the test at appropriate intervals.     CRITICAL RESULT CALLED TO, READ BACK BY AND VERIFIED WITH:     OBERTHALER,J RN 01/17/2013 2212 JORDANS  MRSA PCR SCREENING     Status: None   Collection Time    01/17/13 10:59 PM      Result Value Range   MRSA by PCR NEGATIVE  NEGATIVE   Comment:            The GeneXpert MRSA Assay (FDA     approved for NASAL specimens     only), is one component of a     comprehensive MRSA colonization     surveillance program. It is not     intended to diagnose MRSA     infection nor to guide or     monitor treatment for     MRSA infections.  TROPONIN I     Status: Abnormal   Collection Time    01/17/13 11:45 PM      Result Value Range   Troponin I 3.74 (*) <0.30 ng/mL   Comment:            Due to the release kinetics of cTnI,     a negative result within the first hours     of the onset of symptoms does not rule out     myocardial infarction with certainty.     If myocardial infarction is still suspected,     repeat the test at appropriate intervals.     CRITICAL VALUE NOTED.  VALUE IS CONSISTENT WITH PREVIOUSLY REPORTED AND CALLED VALUE.  CBC     Status: Abnormal   Collection Time    01/18/13  6:22 AM      Result Value Range   WBC 11.1 (*) 4.0 - 10.5 K/uL   RBC 4.54  3.87 - 5.11 MIL/uL   Hemoglobin 13.4  12.0 - 15.0 g/dL   HCT 39.2  36.0 - 46.0 %   MCV 86.3  78.0 - 100.0 fL   MCH 29.5  26.0 - 34.0 pg   MCHC 34.2  30.0 - 36.0 g/dL   RDW 13.7  11.5 - 15.5 %   Platelets 191  150 - 400 K/uL  TROPONIN I     Status: Abnormal   Collection Time    01/18/13  6:22 AM      Result Value Range   Troponin I 5.55 (*) <0.30 ng/mL   Comment:            Due to the release kinetics of cTnI,     a negative result within the first hours     of the onset of symptoms does not rule out     myocardial infarction with certainty.      If myocardial infarction is still suspected,     repeat the test at appropriate intervals.     CRITICAL VALUE NOTED.  VALUE IS CONSISTENT WITH PREVIOUSLY REPORTED AND CALLED VALUE.  BASIC METABOLIC PANEL  Status: Abnormal   Collection Time    01/18/13  6:22 AM      Result Value Range   Sodium 140  137 - 147 mEq/L   Potassium 3.2 (*) 3.7 - 5.3 mEq/L   Chloride 103  96 - 112 mEq/L   CO2 23  19 - 32 mEq/L   Glucose, Bld 97  70 - 99 mg/dL   BUN 7  6 - 23 mg/dL   Creatinine, Ser 0.64  0.50 - 1.10 mg/dL   Calcium 8.4  8.4 - 10.5 mg/dL   GFR calc non Af Amer >90  >90 mL/min   GFR calc Af Amer >90  >90 mL/min   Comment: (NOTE)     The eGFR has been calculated using the CKD EPI equation.     This calculation has not been validated in all clinical situations.     eGFR's persistently <90 mL/min signify possible Chronic Kidney     Disease.  CBC     Status: None   Collection Time    01/18/13  1:16 PM      Result Value Range   WBC 7.0  4.0 - 10.5 K/uL   RBC 4.35  3.87 - 5.11 MIL/uL   Hemoglobin 12.8  12.0 - 15.0 g/dL   HCT 37.8  36.0 - 46.0 %   MCV 86.9  78.0 - 100.0 fL   MCH 29.4  26.0 - 34.0 pg   MCHC 33.9  30.0 - 36.0 g/dL   RDW 13.7  11.5 - 15.5 %   Platelets 165  150 - 400 K/uL    Imaging: Imaging results have been reviewed and Dg Chest Portable 1 View  01/17/2013   CLINICAL DATA:  Chest pain.  EXAM: PORTABLE CHEST - 1 VIEW  COMPARISON:  Chest x-ray 10/08/2012.  FINDINGS: Lung volumes are normal. No consolidative airspace disease. No pleural effusions. No pneumothorax. No pulmonary nodule or mass noted. Pulmonary vasculature and the cardiomediastinal silhouette are within normal limits.  IMPRESSION: 1.  No radiographic evidence of acute cardiopulmonary disease.   Electronically Signed   By: Vinnie Langton M.D.   On: 01/17/2013 20:30    ECHO shows inferolateral hypokinesis and EF 40-45%  Assessment:  1. Active Problems: 2.   NSTEMI (non-ST elevated myocardial  infarction) 3. Ischemic cardiomyopathy with mild acute CHF, class II  Plan:  1. Transfer telemetry. 2. Possible DC in AM 3. Discussed need for dual antiplatelet Rx w/o interruption, smoking cessation, sodium restriction, signs/sxs of CHF exacerbation  Time Spent Directly with Patient:  45 minutes  Length of Stay:  LOS: 1 day    Lindsay Camacho 01/18/2013, 2:14 PM

## 2013-01-18 NOTE — Progress Notes (Signed)
  Echocardiogram 2D Echocardiogram has been performed.  Georgian Co 01/18/2013, 10:58 AM

## 2013-01-19 LAB — CBC
HCT: 36.1 % (ref 36.0–46.0)
HEMOGLOBIN: 12 g/dL (ref 12.0–15.0)
MCH: 29.3 pg (ref 26.0–34.0)
MCHC: 33.2 g/dL (ref 30.0–36.0)
MCV: 88.3 fL (ref 78.0–100.0)
Platelets: 133 10*3/uL — ABNORMAL LOW (ref 150–400)
RBC: 4.09 MIL/uL (ref 3.87–5.11)
RDW: 13.9 % (ref 11.5–15.5)
WBC: 4.6 10*3/uL (ref 4.0–10.5)

## 2013-01-19 LAB — COMPREHENSIVE METABOLIC PANEL
ALT: 8 U/L (ref 0–35)
AST: 17 U/L (ref 0–37)
Albumin: 2.9 g/dL — ABNORMAL LOW (ref 3.5–5.2)
Alkaline Phosphatase: 74 U/L (ref 39–117)
BILIRUBIN TOTAL: 0.5 mg/dL (ref 0.3–1.2)
BUN: 9 mg/dL (ref 6–23)
CALCIUM: 8.2 mg/dL — AB (ref 8.4–10.5)
CHLORIDE: 101 meq/L (ref 96–112)
CO2: 25 mEq/L (ref 19–32)
Creatinine, Ser: 0.74 mg/dL (ref 0.50–1.10)
Glucose, Bld: 84 mg/dL (ref 70–99)
Potassium: 3.6 mEq/L — ABNORMAL LOW (ref 3.7–5.3)
Sodium: 140 mEq/L (ref 137–147)
Total Protein: 6.1 g/dL (ref 6.0–8.3)

## 2013-01-19 MED ORDER — SODIUM CHLORIDE 0.9 % IV SOLN
INTRAVENOUS | Status: AC
  Administered 2013-01-19: 15:00:00 via INTRAVENOUS

## 2013-01-19 MED ORDER — HYDROCORTISONE 0.5 % EX CREA
TOPICAL_CREAM | Freq: Two times a day (BID) | CUTANEOUS | Status: DC
  Administered 2013-01-19 – 2013-01-20 (×3): via TOPICAL
  Filled 2013-01-19: qty 28.35

## 2013-01-19 MED ORDER — DIPHENHYDRAMINE HCL 25 MG PO CAPS
25.0000 mg | ORAL_CAPSULE | Freq: Four times a day (QID) | ORAL | Status: DC | PRN
  Administered 2013-01-19 (×2): 25 mg via ORAL
  Filled 2013-01-19 (×2): qty 1

## 2013-01-19 NOTE — Progress Notes (Signed)
Subjective:  Very nauseous, can only tolerate fluids. Itchy papular rash only over right anterior forearm. Feels weak. No angina, but has chest wall tenderness, easily reproducible, different from chest pain at presentation  Objective:  Temp:  [97.3 F (36.3 C)-98.1 F (36.7 C)] 98.1 F (36.7 C) (01/18 0515) Pulse Rate:  [69-75] 75 (01/18 1030) Resp:  [18-20] 20 (01/18 0515) BP: (47-119)/(30-80) 110/80 mmHg (01/18 1030) SpO2:  [97 %-100 %] 97 % (01/18 0515) Weight:  [58.922 kg (129 lb 14.4 oz)] 58.922 kg (129 lb 14.4 oz) (01/18 0515) Weight change: 3.922 kg (8 lb 10.4 oz)  Intake/Output from previous day: 01/17 0701 - 01/18 0700 In: 1042 [P.O.:840; I.V.:202] Out: 1475 [Urine:1475]  Intake/Output from this shift: Total I/O In: 240 [P.O.:240] Out: -   Medications: Current Facility-Administered Medications  Medication Dose Route Frequency Provider Last Rate Last Dose  . acetaminophen (TYLENOL) tablet 1,000 mg  1,000 mg Oral Q8H Inez Pilgrim, MD   1,000 mg at 01/19/13 0617  . acetaminophen (TYLENOL) tablet 650 mg  650 mg Oral Q4H PRN Casandra Doffing, MD      . aspirin chewable tablet 81 mg  81 mg Oral Daily Casandra Doffing, MD      . aspirin EC tablet 81 mg  81 mg Oral Daily Inez Pilgrim, MD   81 mg at 01/19/13 1025  . atorvastatin (LIPITOR) tablet 80 mg  80 mg Oral Daily Inez Pilgrim, MD   80 mg at 01/19/13 1026  . diphenhydrAMINE (BENADRYL) capsule 25 mg  25 mg Oral Q6H PRN Lendon Colonel, NP   25 mg at 01/19/13 1138  . hydrocortisone cream 0.5 %   Topical BID Lendon Colonel, NP      . lisinopril (PRINIVIL,ZESTRIL) tablet 5 mg  5 mg Oral Daily Inez Pilgrim, MD   5 mg at 01/19/13 1025  . metoprolol succinate (TOPROL-XL) 24 hr tablet 25 mg  25 mg Oral Daily Inez Pilgrim, MD   25 mg at 01/19/13 1025  . morphine 2 MG/ML injection 1-2 mg  1-2 mg Intravenous Q1H PRN Casandra Doffing, MD   2 mg at 01/18/13 1814  . nicotine (NICODERM CQ - dosed in mg/24 hours) patch  14 mg  14 mg Transdermal Daily Inez Pilgrim, MD   14 mg at 01/19/13 1028  . nitroGLYCERIN (NITROSTAT) SL tablet 0.4 mg  0.4 mg Sublingual Q5 Min x 3 PRN Inez Pilgrim, MD   0.4 mg at 01/18/13 1801  . nitroGLYCERIN 0.2 mg/mL in dextrose 5 % infusion  10-200 mcg/min Intravenous Titrated Neta Ehlers, MD   100 mcg/min at 01/18/13 0248  . ondansetron (ZOFRAN) injection 4 mg  4 mg Intravenous Q6H PRN Casandra Doffing, MD   4 mg at 01/19/13 0806  . oxyCODONE-acetaminophen (PERCOCET/ROXICET) 5-325 MG per tablet 1 tablet  1 tablet Oral Q4H PRN Manus Gunning, MD   1 tablet at 01/19/13 1026  . pantoprazole (PROTONIX) EC tablet 40 mg  40 mg Oral Q0600 Inez Pilgrim, MD   40 mg at 01/19/13 0617  . Ticagrelor (BRILINTA) tablet 90 mg  90 mg Oral BID Inez Pilgrim, MD   90 mg at 01/19/13 1026    Physical Exam: General: Alert, oriented x3, no distress,  Head: no evidence of trauma, PERRL, EOMI, no exophtalmos or lid lag, no myxedema, no xanthelasma; normal ears, nose and oropharynx  Neck: normal jugular venous pulsations and no hepatojugular reflux; brisk carotid pulses without delay and no carotid bruits  Chest: clear to  auscultation, no signs of consolidation by percussion or palpation, normal fremitus, symmetrical and full respiratory excursions  Cardiovascular: normal position and quality of the apical impulse, regular rhythm, normal first and paradoxically split second heart sound, no murmurs, rubs or gallops  Abdomen: no tenderness or distention, no masses by palpation, no abnormal pulsatility or arterial bruits, normal bowel sounds, no hepatosplenomegaly  Extremities: no clubbing, cyanosis or edema; 2+ radial, ulnar and brachial pulses bilaterally; 2+ right femoral, posterior tibial and dorsalis pedis pulses; 2+ left femoral, posterior tibial and dorsalis pedis pulses; no subclavian or femoral bruits  Neurological: grossly nonfocal Skin: scattered papular rash on anterior forearm, roughly corresponds to  area of prep for radial cath; small ecchymosis there  Lab Results: Results for orders placed during the hospital encounter of 01/17/13 (from the past 48 hour(s))  CBC WITH DIFFERENTIAL     Status: None   Collection Time    01/17/13  7:57 PM      Result Value Range   WBC 8.3  4.0 - 10.5 K/uL   RBC 4.75  3.87 - 5.11 MIL/uL   Hemoglobin 14.2  12.0 - 15.0 g/dL   HCT 41.8  36.0 - 46.0 %   MCV 88.0  78.0 - 100.0 fL   MCH 29.9  26.0 - 34.0 pg   MCHC 34.0  30.0 - 36.0 g/dL   RDW 13.7  11.5 - 15.5 %   Platelets 202  150 - 400 K/uL   Neutrophils Relative % 69  43 - 77 %   Neutro Abs 5.7  1.7 - 7.7 K/uL   Lymphocytes Relative 21  12 - 46 %   Lymphs Abs 1.8  0.7 - 4.0 K/uL   Monocytes Relative 7  3 - 12 %   Monocytes Absolute 0.6  0.1 - 1.0 K/uL   Eosinophils Relative 3  0 - 5 %   Eosinophils Absolute 0.2  0.0 - 0.7 K/uL   Basophils Relative 0  0 - 1 %   Basophils Absolute 0.0  0.0 - 0.1 K/uL  BASIC METABOLIC PANEL     Status: Abnormal   Collection Time    01/17/13  7:57 PM      Result Value Range   Sodium 140  137 - 147 mEq/L   Potassium 3.4 (*) 3.7 - 5.3 mEq/L   Chloride 103  96 - 112 mEq/L   CO2 23  19 - 32 mEq/L   Glucose, Bld 94  70 - 99 mg/dL   BUN 9  6 - 23 mg/dL   Creatinine, Ser 0.65  0.50 - 1.10 mg/dL   Calcium 8.7  8.4 - 10.5 mg/dL   GFR calc non Af Amer >90  >90 mL/min   GFR calc Af Amer >90  >90 mL/min   Comment: (NOTE)     The eGFR has been calculated using the CKD EPI equation.     This calculation has not been validated in all clinical situations.     eGFR's persistently <90 mL/min signify possible Chronic Kidney     Disease.  PRO B NATRIURETIC PEPTIDE     Status: Abnormal   Collection Time    01/17/13  7:57 PM      Result Value Range   Pro B Natriuretic peptide (BNP) 4026.0 (*) 0 - 125 pg/mL  POCT I-STAT TROPONIN I     Status: Abnormal   Collection Time    01/17/13  8:32 PM      Result Value Range  Troponin i, poc 1.97 (*) 0.00 - 0.08 ng/mL   Comment  NOTIFIED PHYSICIAN     Comment 3            Comment: Due to the release kinetics of cTnI,     a negative result within the first hours     of the onset of symptoms does not rule out     myocardial infarction with certainty.     If myocardial infarction is still suspected,     repeat the test at appropriate intervals.  TROPONIN I     Status: Abnormal   Collection Time    01/17/13  8:32 PM      Result Value Range   Troponin I 3.66 (*) <0.30 ng/mL   Comment:            Due to the release kinetics of cTnI,     a negative result within the first hours     of the onset of symptoms does not rule out     myocardial infarction with certainty.     If myocardial infarction is still suspected,     repeat the test at appropriate intervals.     CRITICAL RESULT CALLED TO, READ BACK BY AND VERIFIED WITH:     OBERTHALER,J RN 01/17/2013 2212 JORDANS  MRSA PCR SCREENING     Status: None   Collection Time    01/17/13 10:59 PM      Result Value Range   MRSA by PCR NEGATIVE  NEGATIVE   Comment:            The GeneXpert MRSA Assay (FDA     approved for NASAL specimens     only), is one component of a     comprehensive MRSA colonization     surveillance program. It is not     intended to diagnose MRSA     infection nor to guide or     monitor treatment for     MRSA infections.  TROPONIN I     Status: Abnormal   Collection Time    01/17/13 11:45 PM      Result Value Range   Troponin I 3.74 (*) <0.30 ng/mL   Comment:            Due to the release kinetics of cTnI,     a negative result within the first hours     of the onset of symptoms does not rule out     myocardial infarction with certainty.     If myocardial infarction is still suspected,     repeat the test at appropriate intervals.     CRITICAL VALUE NOTED.  VALUE IS CONSISTENT WITH PREVIOUSLY REPORTED AND CALLED VALUE.  CBC     Status: Abnormal   Collection Time    01/18/13  6:22 AM      Result Value Range   WBC 11.1 (*) 4.0 -  10.5 K/uL   RBC 4.54  3.87 - 5.11 MIL/uL   Hemoglobin 13.4  12.0 - 15.0 g/dL   HCT 39.2  36.0 - 46.0 %   MCV 86.3  78.0 - 100.0 fL   MCH 29.5  26.0 - 34.0 pg   MCHC 34.2  30.0 - 36.0 g/dL   RDW 13.7  11.5 - 15.5 %   Platelets 191  150 - 400 K/uL  TROPONIN I     Status: Abnormal   Collection Time    01/18/13  6:22 AM  Result Value Range   Troponin I 5.55 (*) <0.30 ng/mL   Comment:            Due to the release kinetics of cTnI,     a negative result within the first hours     of the onset of symptoms does not rule out     myocardial infarction with certainty.     If myocardial infarction is still suspected,     repeat the test at appropriate intervals.     CRITICAL VALUE NOTED.  VALUE IS CONSISTENT WITH PREVIOUSLY REPORTED AND CALLED VALUE.  BASIC METABOLIC PANEL     Status: Abnormal   Collection Time    01/18/13  6:22 AM      Result Value Range   Sodium 140  137 - 147 mEq/L   Potassium 3.2 (*) 3.7 - 5.3 mEq/L   Chloride 103  96 - 112 mEq/L   CO2 23  19 - 32 mEq/L   Glucose, Bld 97  70 - 99 mg/dL   BUN 7  6 - 23 mg/dL   Creatinine, Ser 0.64  0.50 - 1.10 mg/dL   Calcium 8.4  8.4 - 10.5 mg/dL   GFR calc non Af Amer >90  >90 mL/min   GFR calc Af Amer >90  >90 mL/min   Comment: (NOTE)     The eGFR has been calculated using the CKD EPI equation.     This calculation has not been validated in all clinical situations.     eGFR's persistently <90 mL/min signify possible Chronic Kidney     Disease.  TROPONIN I     Status: Abnormal   Collection Time    01/18/13  1:16 PM      Result Value Range   Troponin I 5.54 (*) <0.30 ng/mL   Comment:            Due to the release kinetics of cTnI,     a negative result within the first hours     of the onset of symptoms does not rule out     myocardial infarction with certainty.     If myocardial infarction is still suspected,     repeat the test at appropriate intervals.     CRITICAL VALUE NOTED.  VALUE IS CONSISTENT WITH PREVIOUSLY  REPORTED AND CALLED VALUE.  CBC     Status: None   Collection Time    01/18/13  1:16 PM      Result Value Range   WBC 7.0  4.0 - 10.5 K/uL   RBC 4.35  3.87 - 5.11 MIL/uL   Hemoglobin 12.8  12.0 - 15.0 g/dL   HCT 37.8  36.0 - 46.0 %   MCV 86.9  78.0 - 100.0 fL   MCH 29.4  26.0 - 34.0 pg   MCHC 33.9  30.0 - 36.0 g/dL   RDW 13.7  11.5 - 15.5 %   Platelets 165  150 - 400 K/uL  TROPONIN I     Status: Abnormal   Collection Time    01/18/13  7:10 PM      Result Value Range   Troponin I 5.28 (*) <0.30 ng/mL   Comment:            Due to the release kinetics of cTnI,     a negative result within the first hours     of the onset of symptoms does not rule out     myocardial infarction with certainty.  If myocardial infarction is still suspected,     repeat the test at appropriate intervals.     CRITICAL VALUE NOTED.  VALUE IS CONSISTENT WITH PREVIOUSLY REPORTED AND CALLED VALUE.  CBC     Status: Abnormal   Collection Time    01/19/13  4:08 AM      Result Value Range   WBC 4.6  4.0 - 10.5 K/uL   RBC 4.09  3.87 - 5.11 MIL/uL   Hemoglobin 12.0  12.0 - 15.0 g/dL   HCT 36.1  36.0 - 46.0 %   MCV 88.3  78.0 - 100.0 fL   MCH 29.3  26.0 - 34.0 pg   MCHC 33.2  30.0 - 36.0 g/dL   RDW 13.9  11.5 - 15.5 %   Platelets 133 (*) 150 - 400 K/uL    Imaging: Imaging results have been reviewed and Dg Chest Portable 1 View  01/17/2013   CLINICAL DATA:  Chest pain.  EXAM: PORTABLE CHEST - 1 VIEW  COMPARISON:  Chest x-ray 10/08/2012.  FINDINGS: Lung volumes are normal. No consolidative airspace disease. No pleural effusions. No pneumothorax. No pulmonary nodule or mass noted. Pulmonary vasculature and the cardiomediastinal silhouette are within normal limits.  IMPRESSION: 1.  No radiographic evidence of acute cardiopulmonary disease.   Electronically Signed   By: Vinnie Langton M.D.   On: 01/17/2013 20:30    Assessment:  1. Active Problems: 2.   NSTEMI (non-ST elevated myocardial  infarction) 3. Nausea 4. Rash  Plan:  1. Looks a little dehydrated and having trouble holding down fluids, cannot go home  2. Rash may be related to prep for cath; steroids/antihistamines given 3. Chest pain is clearly nonanginal 4. Nausea may be secondary to Brilinta. Cannot take Effient. Best option would be clopidogrel. She is not on a PPI.  Time Spent Directly with Patient:  30 minutes  Length of Stay:  LOS: 2 days    Malaysia Crance 01/19/2013, 12:29 PM

## 2013-01-19 NOTE — Progress Notes (Signed)
Utilization review completed.  

## 2013-01-20 DIAGNOSIS — F17200 Nicotine dependence, unspecified, uncomplicated: Secondary | ICD-10-CM

## 2013-01-20 DIAGNOSIS — I255 Ischemic cardiomyopathy: Secondary | ICD-10-CM | POA: Diagnosis present

## 2013-01-20 DIAGNOSIS — R0789 Other chest pain: Secondary | ICD-10-CM

## 2013-01-20 DIAGNOSIS — I2589 Other forms of chronic ischemic heart disease: Secondary | ICD-10-CM

## 2013-01-20 DIAGNOSIS — I2510 Atherosclerotic heart disease of native coronary artery without angina pectoris: Secondary | ICD-10-CM | POA: Diagnosis present

## 2013-01-20 DIAGNOSIS — R21 Rash and other nonspecific skin eruption: Secondary | ICD-10-CM | POA: Diagnosis not present

## 2013-01-20 LAB — CBC
HCT: 35.6 % — ABNORMAL LOW (ref 36.0–46.0)
Hemoglobin: 11.9 g/dL — ABNORMAL LOW (ref 12.0–15.0)
MCH: 29.4 pg (ref 26.0–34.0)
MCHC: 33.4 g/dL (ref 30.0–36.0)
MCV: 87.9 fL (ref 78.0–100.0)
PLATELETS: 137 10*3/uL — AB (ref 150–400)
RBC: 4.05 MIL/uL (ref 3.87–5.11)
RDW: 13.6 % (ref 11.5–15.5)
WBC: 5 10*3/uL (ref 4.0–10.5)

## 2013-01-20 LAB — LIPID PANEL
Cholesterol: 191 mg/dL (ref 0–200)
HDL: 28 mg/dL — ABNORMAL LOW (ref >39)
LDL Cholesterol: 140 mg/dL — ABNORMAL HIGH (ref 0–99)
Total CHOL/HDL Ratio: 6.8 RATIO
Triglycerides: 117 mg/dL (ref <150)
VLDL: 23 mg/dL (ref 0–40)

## 2013-01-20 LAB — POCT ACTIVATED CLOTTING TIME
ACTIVATED CLOTTING TIME: 254 s
ACTIVATED CLOTTING TIME: 343 s

## 2013-01-20 MED ORDER — PANTOPRAZOLE SODIUM 40 MG PO TBEC: 40.0000 mg | DELAYED_RELEASE_TABLET | Freq: Every day | ORAL | Status: DC

## 2013-01-20 MED ORDER — OXYCODONE-ACETAMINOPHEN 5-325 MG PO TABS: 1.0000 | ORAL_TABLET | ORAL | Status: DC | PRN

## 2013-01-20 MED ORDER — HYDROCORTISONE 0.5 % EX CREA: TOPICAL_CREAM | Freq: Two times a day (BID) | CUTANEOUS | Status: DC

## 2013-01-20 MED ORDER — CLOPIDOGREL BISULFATE 75 MG PO TABS
75.0000 mg | ORAL_TABLET | Freq: Every day | ORAL | Status: DC
  Administered 2013-01-20: 75 mg via ORAL
  Filled 2013-01-20 (×2): qty 1

## 2013-01-20 MED ORDER — ATORVASTATIN CALCIUM 80 MG PO TABS: 80.0000 mg | ORAL_TABLET | Freq: Every day | ORAL | Status: DC

## 2013-01-20 MED ORDER — CLOPIDOGREL BISULFATE 75 MG PO TABS: 75.0000 mg | ORAL_TABLET | Freq: Every day | ORAL | Status: DC

## 2013-01-20 MED ORDER — NITROGLYCERIN 0.4 MG SL SUBL: 0.4000 mg | SUBLINGUAL_TABLET | SUBLINGUAL | Status: DC | PRN

## 2013-01-20 MED ORDER — DIPHENHYDRAMINE HCL 25 MG PO CAPS: 25.0000 mg | ORAL_CAPSULE | Freq: Four times a day (QID) | ORAL | Status: DC | PRN

## 2013-01-20 MED ORDER — ACETAMINOPHEN 325 MG PO TABS: 650.0000 mg | ORAL_TABLET | ORAL | Status: DC | PRN

## 2013-01-20 MED ORDER — NICOTINE 14 MG/24HR TD PT24: 14.0000 mg | MEDICATED_PATCH | Freq: Every day | TRANSDERMAL | Status: DC

## 2013-01-20 NOTE — Progress Notes (Signed)
Subjective:  Weak, SOB, c/o pain and tenderness Rt arm. Still complaining of nausea but she did eat some of her breakfast.   Objective:  Vital Signs in the last 24 hours: Temp:  [97.9 F (36.6 C)-98 F (36.7 C)] 97.9 F (36.6 C) (01/19 0405) Pulse Rate:  [75-85] 76 (01/19 0405) Resp:  [18-20] 20 (01/19 0405) BP: (87-121)/(50-80) 121/69 mmHg (01/19 0405) SpO2:  [96 %-100 %] 96 % (01/19 0405) Weight:  [130 lb 12.8 oz (59.33 kg)] 130 lb 12.8 oz (59.33 kg) (01/19 0405)  Intake/Output from previous day:  Intake/Output Summary (Last 24 hours) at 01/20/13 0907 Last data filed at 01/20/13 0730  Gross per 24 hour  Intake   1715 ml  Output   2000 ml  Net   -285 ml    Physical Exam: General appearance: alert, cooperative, no distress and poor dentition Lungs: clear to auscultation bilaterally Heart: regular rate and rhythm Rt arm is tender and swollen to elbow compared to the left, also warm to touch.  Skin- she still has rash, noted on Rt arm   Rate: 76  Rhythm: normal sinus rhythm  Lab Results:  Recent Labs  01/19/13 0408 01/20/13 0348  WBC 4.6 5.0  HGB 12.0 11.9*  PLT 133* 137*    Recent Labs  01/18/13 0622 01/19/13 1535  NA 140 140  K 3.2* 3.6*  CL 103 101  CO2 23 25  GLUCOSE 97 84  BUN 7 9  CREATININE 0.64 0.74    Recent Labs  01/18/13 1316 01/18/13 1910  TROPONINI 5.54* 5.28*   No results found for this basename: INR,  in the last 72 hours  Imaging: Imaging results have been reviewed  Cardiac Studies:  Assessment/Plan:   Principal Problem:   NSTEMI (non-ST elevated myocardial infarction) Active Problems:   CAD - CFX DES 01/17/13   Cardiomyopathy, ischemic- EF 40-45% by echo 01/18/13   Rash post PCI   Chest pain, musculoskeletal   HTN (hypertension)   LBBB (left bundle branch block)   Tobacco abuse    PLAN: Check lipid panel, contniue current meds, consider ? Keflex for Rt arm. I'm not sure she is ready for discharge today. She lives  alone, [place].   Corine Shelter PA-C Beeper [phone] 01/20/2013, 9:07 AM   Telemetry reviewed, vitals reviewed. Mild shortness of breath with activity. Right arm does not appear vastly different from left, warm, normal pulses, no significant erythema. Good grip.  She felt mild dizziness with cardiac rehabilitation team. Mild shortness of breath.  I decided to discontinue her Brilinta and start clopidogrel 75 mg once a day (mostly because of complaints of shortness of breath). Normal ejection fraction by catheterization, mild to moderately reduced by echocardiogram 40-45%. I do believe that it is reasonable for her to attempt discharge today with close followup. Successful PCI of proximal left circumflex. Smoking cessation discussed.  If arm worsens or becomes more worrisome or if symptoms worsen or become more worrisome, she knows to seek medical attention. Stressed the importance of medications with her. Although nausea has been a previous complaint, she was seen eating her breakfast. She actually would like to go home.

## 2013-01-20 NOTE — Discharge Instructions (Signed)
Coronary Angiography with Stent °Coronary angiography with stent placement is a procedure to widen or open a narrow blood vessel of the heart (coronary artery). When a coronary artery becomes partially blocked, it decreases blood flow to that area. This may lead to chest pain or a heart attack (myocardial infarction). Arteries may become blocked by cholesterol buildup (plaque) in the lining or wall.  °A stent is a small piece of metal that looks like a mesh or a spring. Stent placement may be done right after a coronary angiography in which a blocked artery is found or as a treatment for a heart attack.  °LET YOUR HEALTH CARE PROVIDER KNOW ABOUT: °· Any allergies you have.   °· All medicines you are taking, including vitamins, herbs, eye drops, creams, and over-the-counter medicines.   °· Previous problems you or members of your family have had with the use of anesthetics.   °· Any blood disorders you have.   °· Previous surgeries you have had.   °· Medical conditions you have. °RISKS AND COMPLICATIONS °Generally, coronary angiography with stent is a safe procedure. However, as with any procedure, complications can occur. Possible complications include:  °· Damage to the heart or its blood vessels.   °· A return of blockage.   °· Bleeding at the site.   °· Blood clot in another part of the body.   °· Kidney injury.   °· Allergic reaction to the dye or contrast used.   °BEFORE THE PROCEDURE °· Do not eat or drink anything for 6 hours before the procedure.   °· Ask your health care provider if medicines can be taken with a sip of water.   °· Your health care provider will make sure you understand the procedure and the risks and potential complications associated with the procedure.   °PROCEDURE °· You may be given a medicine to help you relax before and during the procedure (sedative). This medicine will be given through an IV tube that is put into one of your veins.   °· The area where the catheter will be inserted  is shaved and cleaned. This is usually done in the groin but may be done in the fold of your arm (near your elbow) or in the wrist.    °· A medicine will be given to numb the area where the catheter will be inserted (local anesthetic).   °· The catheter is inserted into an artery using a guide wire. A type of X-ray (fluoroscopy) is used to help guide the catheter to the opening of the blocked artery.   °· A dye is then injected into the catheter, and X-rays are taken. The dye helps to show where any narrowing or blockages are located in the heart arteries.   °· A tiny wire is guided to the blocked spot, and a balloon is inflated to make the artery wider. The stent is expanded and crushes the plaque into the wall of the vessel. The stent holds the area open like a scaffolding and improves the blood flow.   °· Sometimes the artery may be made wider using a laser or other tools to remove plaque.   °· When the blood flow is better, the catheter is removed. The lining of the artery will grow over the stent, which stays where it was placed.   °AFTER THE PROCEDURE °· If the procedure is done through the leg, you will be kept in bed lying flat for about 6 hours. You will be instructed to not bend or cross your legs.   °· The insertion site will be checked frequently.   °· The pulse in your   feet or wrist will be checked frequently.   °· Additional blood tests, X-rays, and electrocardiography may be done. °Document Released: 06/25/2002 Document Revised: 10/09/2012 Document Reviewed: 06/27/2012 °ExitCare® Patient Information ©2014 [employer]. °Myocardial Infarction °A myocardial infarction (MI) is damage to the heart that is not reversible. It is also called a heart attack. An MI usually occurs when a heart (coronary) artery becomes blocked or narrowed. This cuts off the blood supply to the heart. When one or more of the heart (coronary) arteries becomes blocked, that area of the heart begins to die. This causes pain felt  during an MI.  °If you think you might be having an MI, call your local emergency services immediately (911 in [place] It is recommended that you take a 162 mg non-enteric coated aspirin if you do not have an aspirin allergy. Do not drive yourself to the hospital or wait to see if your symptoms go away. The sooner MI is treated, the greater the amount of heart muscle saved. Time is muscle. It can save your life. °CAUSES  °An MI can occur from: °· A gradual buildup of a fatty substance called plaque. When plaque builds up in the arteries, this condition is called atherosclerosis. This buildup can block or reduce the blood supply to the heart artery(s). °· A sudden plaque rupture within a heart artery that causes a blood clot (thrombus). A blood clot can block the heart artery which does not allow blood flow to the heart. °· A severe tightening (spasm) of the heart artery. This is a less common cause of a heart attack. When a heart artery spasms, it cuts off blood flow through the artery. Spasms can occur in heart arteries that do not have atherosclerosis. °RISK FACTORS °People at risk for an MI usually have one or more risk factors, such as: °· High blood pressure. °· High cholesterol. °· Smoking. °· Gender. Men have a higher heart attack risk. °· Overweight/obesity. °· Age. °· Family history. °· Lack of exercise. °· Diabetes. °· Stress. °· Excessive alcohol use. °· Street drug use (cocaine and methamphetamines). °SYMPTOMS  °MI symptoms can vary, such as: °· In both men and women, MI symptoms can include the following: °· Chest pain. The chest pain may feel like a crushing, squeezing, or "pressure" type feeling. MI pain can be "referred," meaning pain can be caused in one part of the body but felt in another part of the body. Referred MI pain may occur in the left arm, neck, or jaw. Pain may even be felt in the right arm. °· Shortness of breath (dyspnea). °· Heartburn or indigestion with or without vomiting, shortness  of breath, or sweating (diaphoresis). °· Sudden, cold sweats. °· Sudden lightheadedness. °· Upper back pain. °· Women can have unique MI symptoms, such as: °· Unexplained feelings of nervousness or anxiety. °· Discomfort between the shoulder blades (scapula) or upper back. °· Tingling in the hands and arms. °· In elderly people (regardless of gender), MI symptoms can be subtle, such as: °· Sweating (diaphoresis). °· Shortness of breath (dyspnea). °· General tiredness (fatigue) or not feeling well (malaise). °DIAGNOSIS  °Diagnosis of an MI involves several tests such as: °· An assessment of your vital signs such as heart rhythm, blood pressure, respiratory rate, and oxygen level. °· An EKG (ECG) to look at the electrical activity of your heart. °· Blood tests called cardiac markers are drawn at scheduled times to measure proteins or enzymes released by the damaged heart muscle. °· A   chest X-ray. °· An echocardiogram to evaluate heart motion and blood flow. °· Coronary angiography (cardiac catheterization). This is a diagnostic procedure to look at the heart arteries. °TREATMENT  °Acute Intervention. For an MI, the national standard in the [place] is to have an acute intervention in under 90 minutes from the time you get to the hospital. An acute intervention is a special procedure to open up the heart arteries. It is done in a treatment room called a "catheterization lab" (cath lab). Some hospitals do no have a cath lab. If you are having an MI and the hospital does not have a cath lab, the standard is to transport you to a hospital that has one. In the cath lab, acute intervention includes: °· Angioplasty. An angioplasty involves inserting a thin, flexible tube (catheter) into an artery in either your groin or wrist. The catheter is threaded to the heart arteries. A balloon at the end of the catheter is inflated to open a narrowed or blocked heart artery. During an angioplasty procedure, a small mesh tube  (stent) may be used to keep the heart artery open. Depending on your condition and health history, one of two types of stents may be placed: °· Drug-eluting stent (DES). A DES is coated with a medicine to prevent scar tissue from growing over the stent. With drug-eluting stents, blood thinning medicine will need to be taken for up to a year. °· Bare metal stent. This type of stent has no special coating to keep tissue from growing over it. This type of stent is used if you cannot take blood thinning medicine for a prolonged time or you need surgery in the near future. After a bare metal stent is placed, blood thinning medicine will need to be taken for about a month. °· If you are taking blood thinning medicine (anti-platelet therapy) after stent placement, do not stop taking it unless your caregiver says it is okay to do so. Make sure you understand how long you need to take the medicine. °Surgical Intervention °· If an acute intervention is not successful, surgery may be needed: °· Open heart surgery (coronary artery bypass graft, CABG). CABG takes a vein (saphenous vein) from your leg. The vein is then attached to the blocked heart artery which bypasses the blockage. This then allows blood flow to the heart muscle. °Additional Interventions °· A "clot buster" medicine (thrombolytic) may be given. This medicine can help break up a clot in the heart artery. This medicine may be given if a person cannot get to a cath lab right away. °· Intra-aortic balloon pump (IABP). If you have suffered a very severe MI and are too unstable to go to the cath lab or to surgery, an IABP may be used. This is a temporary mechanical device used to increase blood flow to the heart and reduce the workload of the heart until you are stable enough to go to the cath lab or surgery. °HOME CARE INSTRUCTIONS °After an MI, you may need the following: °· Medicine. Take medicine as directed by your caregiver. Medicines after an MI may: °· Keep  your blood from clotting easily (blood thinners). °· Control your blood pressure. °· Help lower your cholesterol. °· Control abnormal heart rhythms. °· Lifestyle changes. Under the guidance of your caregiver, lifestyle changes include: °· Quitting smoking, if you smoke. Your caregiver can help you quit. °· Being physically active. °· Maintaining a healthy weight. °· Eating a heart healthy diet. A dietitian can help   you learn healthy eating options. °· Managing diabetes. °· Reducing stress. °· Limiting alcohol intake. °SEEK IMMEDIATE MEDICAL CARE IF:  °· You have severe chest pain, especially if the pain is crushing or pressure-like and spreads to the arms, back, neck, or jaw. This is an emergency. Do not wait to see if the pain will go away. Get medical help at once. Call your local emergency services (911 in the [place] Do not drive yourself to the hospital. °· You have shortness of breath during rest, sleep, or with activity. °· You have sudden sweating or clammy skin. °· You feel sick to your stomach (nauseous) and throw up (vomit). °· You suddenly become lightheaded or dizzy. °· You feel your heart beating rapidly or you notice "skipped" beats. °MAKE SURE YOU:  °· Understand these instructions. °· Will watch your condition. °· Will get help right away if you are not doing well or get worse. °Document Released: 12/19/2004 Document Revised: 09/13/2011 Document Reviewed: 05/24/2010 °ExitCare® Patient Information ©2014 [employer]. ° °

## 2013-01-20 NOTE — Progress Notes (Signed)
CSW received consult for patient affording medications. CSW will pass this on to the Case Manager who will follow up with patient. CSW signing off. Please re consult if social worker needs arise.  Maree Krabbe, MSW, Theresia Majors [phone]

## 2013-01-20 NOTE — Discharge Summary (Signed)
Patient ID: Lindsay Camacho,  MRN: [id number], DOB/AGE: 06-20-1965 48 y.o.  Admit date: 01/17/2013 Discharge date: 01/20/2013  Primary Care Provider:  Primary Cardiologist: Dr Irish Lack (new)  Discharge Diagnoses Principal Problem:   NSTEMI (non-ST elevated myocardial infarction) Active Problems:   CAD - CFX DES 01/17/13   Cardiomyopathy, ischemic- EF 40-45% by echo 01/18/13   Rash post PCI   Chest pain, musculoskeletal   HTN (hypertension)   LBBB (left bundle branch block)   Tobacco abuse    Procedures: Cath/ PCI   Hospital Course:  48 y/o female from [place], works in [employer], no prior history of CAD. She presented 01/17/13 with [place].  Her initial EKG showed LBBB which was not new. She initially improved with IV NTG but 2-3 hours later had recurrent chest pain and was taken to the cath lab by Dr Irish Lack 01/18/13.  Cath revealed 99% stenosis in the proximal left circumflex artery with moderate disease extending into the large, OM2 branch. This was successfully treated with drug-eluting stent. Her Troponin peaked at 5.55. Post PCI she was weak and nauseated and she was kept an extra 24 hours. She did develop a rash and was Rx'd with Benadryl as well. Dr Marlou Porch changed her Brilinta to Plavix at discharge because of continued dyspnea.  Echo showed an EF of 40-45%. She wil be out of work till cleared by Dr Irish Lack.   Discharge Vitals:  Blood pressure 121/69, pulse 76, temperature 97.9 F (36.6 C), temperature source Oral, resp. rate 20, height 5' 2" (1.575 m), weight 130 lb 12.8 oz (59.33 kg), SpO2 96.00%.    Labs: Results for orders placed during the hospital encounter of 01/17/13 (from the past 48 hour(s))  TROPONIN I     Status: Abnormal   Collection Time    01/18/13  7:10 PM      Result Value Range   Troponin I 5.28 (*) <0.30 ng/mL   Comment:            Due to the release kinetics of cTnI,     a negative result within the first hours     of the onset of symptoms does  not rule out     myocardial infarction with certainty.     If myocardial infarction is still suspected,     repeat the test at appropriate intervals.     CRITICAL VALUE NOTED.  VALUE IS CONSISTENT WITH PREVIOUSLY REPORTED AND CALLED VALUE.  CBC     Status: Abnormal   Collection Time    01/19/13  4:08 AM      Result Value Range   WBC 4.6  4.0 - 10.5 K/uL   RBC 4.09  3.87 - 5.11 MIL/uL   Hemoglobin 12.0  12.0 - 15.0 g/dL   HCT 36.1  36.0 - 46.0 %   MCV 88.3  78.0 - 100.0 fL   MCH 29.3  26.0 - 34.0 pg   MCHC 33.2  30.0 - 36.0 g/dL   RDW 13.9  11.5 - 15.5 %   Platelets 133 (*) 150 - 400 K/uL  COMPREHENSIVE METABOLIC PANEL     Status: Abnormal   Collection Time    01/19/13  3:35 PM      Result Value Range   Sodium 140  137 - 147 mEq/L   Potassium 3.6 (*) 3.7 - 5.3 mEq/L   Chloride 101  96 - 112 mEq/L   CO2 25  19 - 32 mEq/L   Glucose, Bld 84  70 -  99 mg/dL   BUN 9  6 - 23 mg/dL   Creatinine, Ser 0.74  0.50 - 1.10 mg/dL   Calcium 8.2 (*) 8.4 - 10.5 mg/dL   Total Protein 6.1  6.0 - 8.3 g/dL   Albumin 2.9 (*) 3.5 - 5.2 g/dL   AST 17  0 - 37 U/L   ALT 8  0 - 35 U/L   Alkaline Phosphatase 74  39 - 117 U/L   Total Bilirubin 0.5  0.3 - 1.2 mg/dL   GFR calc non Af Amer >90  >90 mL/min   GFR calc Af Amer >90  >90 mL/min   Comment: (NOTE)     The eGFR has been calculated using the CKD EPI equation.     This calculation has not been validated in all clinical situations.     eGFR's persistently <90 mL/min signify possible Chronic Kidney     Disease.  CBC     Status: Abnormal   Collection Time    01/20/13  3:48 AM      Result Value Range   WBC 5.0  4.0 - 10.5 K/uL   RBC 4.05  3.87 - 5.11 MIL/uL   Hemoglobin 11.9 (*) 12.0 - 15.0 g/dL   HCT 35.6 (*) 36.0 - 46.0 %   MCV 87.9  78.0 - 100.0 fL   MCH 29.4  26.0 - 34.0 pg   MCHC 33.4  30.0 - 36.0 g/dL   RDW 13.6  11.5 - 15.5 %   Platelets 137 (*) 150 - 400 K/uL  LIPID PANEL     Status: Abnormal   Collection Time    01/20/13  9:30 AM       Result Value Range   Cholesterol 191  0 - 200 mg/dL   Triglycerides 117  <150 mg/dL   HDL 28 (*) >39 mg/dL   Total CHOL/HDL Ratio 6.8     VLDL 23  0 - 40 mg/dL   LDL Cholesterol 140 (*) 0 - 99 mg/dL   Comment:            Total Cholesterol/HDL:CHD Risk     Coronary Heart Disease Risk Table                         Men   Women      1/2 Average Risk   3.4   3.3      Average Risk       5.0   4.4      2 X Average Risk   9.6   7.1      3 X Average Risk  23.4   11.0                Use the calculated Patient Ratio     above and the CHD Risk Table     to determine the patient's CHD Risk.                ATP III CLASSIFICATION (LDL):      <100     mg/dL   Optimal      100-129  mg/dL   Near or Above                        Optimal      130-159  mg/dL   Borderline      160-189  mg/dL   High      >190  mg/dL   Very High    Disposition:  Follow-up Information   Follow up with VARANASI,JAYADEEP S., MD On 02/06/2013. (4 pm)    Specialty:  Cardiology   Contact information:   [address] [place] [address] [phone]       Discharge Medications:    Medication List    STOP taking these medications       ibuprofen 200 MG tablet  Commonly known as:  ADVIL,MOTRIN      TAKE these medications       acetaminophen 325 MG tablet  Commonly known as:  TYLENOL  Take 2 tablets (650 mg total) by mouth every 4 (four) hours as needed for headache or mild pain.     aspirin 81 MG tablet  Take 1 tablet (81 mg total) by mouth daily.     atorvastatin 80 MG tablet  Commonly known as:  LIPITOR  Take 1 tablet (80 mg total) by mouth daily.     clopidogrel 75 MG tablet  Commonly known as:  PLAVIX  Take 1 tablet (75 mg total) by mouth daily with breakfast.     diphenhydrAMINE 25 mg capsule  Commonly known as:  BENADRYL  Take 1 capsule (25 mg total) by mouth every 6 (six) hours as needed (Itching).     hydrocortisone cream 0.5 %  Apply topically 2 (two) times  daily.     lisinopril 5 MG tablet  Commonly known as:  PRINIVIL,ZESTRIL  Take 5 mg by mouth daily.     metoprolol succinate 25 MG 24 hr tablet  Commonly known as:  TOPROL XL  Take 1 tablet (25 mg total) by mouth daily.     nicotine 14 mg/24hr patch  Commonly known as:  NICODERM CQ - dosed in mg/24 hours  Place 1 patch (14 mg total) onto the skin daily.     nitroGLYCERIN 0.4 MG SL tablet  Commonly known as:  NITROSTAT  Place 1 tablet (0.4 mg total) under the tongue every 5 (five) minutes x 3 doses as needed for chest pain.     oxyCODONE-acetaminophen 5-325 MG per tablet  Commonly known as:  PERCOCET/ROXICET  Take 1 tablet by mouth every 4 (four) hours as needed for severe pain.     pantoprazole 40 MG tablet  Commonly known as:  PROTONIX  Take 1 tablet (40 mg total) by mouth daily at 6 (six) AM.         Duration of Discharge Encounter: Greater than 30 minutes including physician time.  Signed, Luke Kilroy PA-C 01/20/2013 1:55 PM  Agree with assessment and plan. Personally seen and examined. Please see my progress note for details if needed.    SKAINS, MARK, MD 

## 2013-01-20 NOTE — Progress Notes (Signed)
CARDIAC REHAB PHASE I   PRE:  Rate/Rhythm: 77 LBBB after BR    BP: sitting 102/80    SaO2:   MODE:  Ambulation: 160 ft   POST:  Rate/Rhythm: 67 LBBB    BP: sitting 100/80     SaO2:   Pt c/o dizziness upon standing. Slow pace, small steps, held to handrail most of walk. Did let go and walk with min assist when discussed possibility of d/c today. Pt c/o lightheadedness, SOB, and chest wall pain the entire walk. To recliner. Sts she is not dizzy in recliner but continues to state she is SOB. Ed completed with pt. Has been cutting down on smoking and now wants to quit completely. Gave resources. Interested in [facility] and will send referral to [place] [facility]. Expect moderate compliance. [phone]  Lindsay Camacho Myra CES, ACSM 01/20/2013 10:00 AM

## 2013-01-21 ENCOUNTER — Telehealth: Payer: Self-pay

## 2013-01-21 MED FILL — Tirofiban HCl in NaCl IV Soln 25 MG/500ML (Base Equiv): INTRAVENOUS | Qty: 100 | Status: AC

## 2013-01-21 NOTE — Telephone Encounter (Signed)
Received call from patient she stated she was discharged from hospital yesterday 01/10/13.Stated she had a right radial cath on 01/17/13.Stated she developed swelling and a red blistery rash on rt wrist after cath was told to use hydrocortisone cream.Stated hydrocortisone cream not working rash is worse.Stated red blistery rash has spread down into rt hand and up rt arm and swelling is worse.Stated very painful.Dr.Varansi not in office this afternoon.Spoke to Norma Fredrickson NP she advised may be shingles.Advised needs to see PCP or Urgent Care now.Stated she will call her PCP in [place] now.Advised to call back if needed.Message sent to Dr.Varansi for review.

## 2013-01-27 ENCOUNTER — Encounter: Payer: Self-pay | Admitting: Interventional Cardiology

## 2013-02-03 ENCOUNTER — Encounter: Payer: Self-pay | Admitting: Interventional Cardiology

## 2013-02-03 ENCOUNTER — Ambulatory Visit (INDEPENDENT_AMBULATORY_CARE_PROVIDER_SITE_OTHER): Payer: BC Managed Care – PPO | Admitting: Interventional Cardiology

## 2013-02-03 VITALS — BP 82/58 | HR 75 | Ht 61.5 in | Wt 131.0 lb

## 2013-02-03 DIAGNOSIS — Z720 Tobacco use: Secondary | ICD-10-CM

## 2013-02-03 DIAGNOSIS — I214 Non-ST elevation (NSTEMI) myocardial infarction: Secondary | ICD-10-CM

## 2013-02-03 DIAGNOSIS — I2510 Atherosclerotic heart disease of native coronary artery without angina pectoris: Secondary | ICD-10-CM

## 2013-02-03 DIAGNOSIS — I10 Essential (primary) hypertension: Secondary | ICD-10-CM

## 2013-02-03 DIAGNOSIS — F17200 Nicotine dependence, unspecified, uncomplicated: Secondary | ICD-10-CM

## 2013-02-03 NOTE — Patient Instructions (Addendum)
Your physician recommends that you return for a FASTING lipid profile: in 3 months.  Your physician wants you to follow-up in: 3 months with Dr. Eldridge Dace.  You will receive a reminder letter in the mail two months in advance. If you don't receive a letter, please call our office to schedule the follow-up appointment.  You can go back to work on 02/10/13 with no restrictions.

## 2013-02-03 NOTE — Progress Notes (Signed)
Patient ID: Lindsay [name], female   DOB: Jun 27, 1965, 48 y.o.   MRN: [id number]    [address] [place], [place]  [address] Phone: [phone] Fax:  [phone]  Date:  02/03/2013   ID:  Lindsay Camacho, DOB Jun 27, 1965, MRN [id number]  PCP:  No PCP Per Patient      History of Present Illness: Lindsay ReidRonda Hannan is a 48 y.o. female who had a lateral wall MI.  She had a DES to the circumflex.  She has had some mild Cp related to turning a certain way.  No problems with walking other than SHOB with exertion.  She continues to smoke, although les than before.  No Cp like the MI.  She had no bleeding problems with the right wrist site.   She has had some low BP readings and dizziness.  Most readings are around 100/60,  Some in the 80s systolic.    Wt Readings from Last 3 Encounters:  02/03/13 131 lb (59.421 kg)  01/20/13 130 lb 12.8 oz (59.33 kg)  01/20/13 130 lb 12.8 oz (59.33 kg)     Past Medical History  Diagnosis Date  . Hypertension   . Stroke     tia with no deficits  . CAD (coronary artery disease) 11/13/2011    50% ? proximal LCx stenosis [facility]  . PVD (peripheral vascular disease)     Right CFA stenosis per report on 11/14/2011  . HLD (hyperlipidemia)   . LBBB (left bundle branch block)   . Acute myocardial infarction of other lateral wall, initial episode of care     Current Outpatient Prescriptions  Medication Sig Dispense Refill  . acetaminophen (TYLENOL) 325 MG tablet Take 2 tablets (650 mg total) by mouth every 4 (four) hours as needed for headache or mild pain.      Marland Kitchen. aspirin 81 MG tablet Take 1 tablet (81 mg total) by mouth daily.  30 tablet    . atorvastatin (LIPITOR) 80 MG tablet Take 1 tablet (80 mg total) by mouth daily.  90 tablet  3  . cephALEXin (KEFLEX) 500 MG capsule       . clopidogrel (PLAVIX) 75 MG tablet Take 1 tablet (75 mg total) by mouth daily with breakfast.  90 tablet  3  . diphenhydrAMINE (BENADRYL) 25 mg capsule Take 1 capsule (25 mg  total) by mouth every 6 (six) hours as needed (Itching).  30 capsule  0  . hydrocortisone cream 0.5 % Apply topically 2 (two) times daily.  30 g  0  . lisinopril (PRINIVIL,ZESTRIL) 5 MG tablet Take 5 mg by mouth daily.      . metoprolol succinate (TOPROL XL) 25 MG 24 hr tablet Take 1 tablet (25 mg total) by mouth daily.  30 tablet  0  . naproxen (NAPROSYN) 500 MG tablet       . nicotine (NICODERM CQ - DOSED IN MG/24 HOURS) 14 mg/24hr patch Place 1 patch (14 mg total) onto the skin daily.  28 patch  0  . nitroGLYCERIN (NITROSTAT) 0.4 MG SL tablet Place 1 tablet (0.4 mg total) under the tongue every 5 (five) minutes x 3 doses as needed for chest pain.  25 tablet  2  . oxyCODONE-acetaminophen (PERCOCET/ROXICET) 5-325 MG per tablet Take 1 tablet by mouth every 4 (four) hours as needed for severe pain.  30 tablet  0  . pantoprazole (PROTONIX) 40 MG tablet Take 1 tablet (40 mg total) by mouth daily at 6 (  six) AM.  90 tablet  3  . predniSONE (DELTASONE) 10 MG tablet        No current facility-administered medications for this visit.    Allergies:    Allergies  Allergen Reactions  . Penicillins Rash    Social History:  The patient  reports that she has been smoking.  She has never used smokeless tobacco. She reports that she uses illicit drugs (Marijuana). She reports that she does not drink alcohol.   Family History:  The patient's family history includes Bladder Cancer in her mother; CAD (age of onset: 90) in her mother; CAD (age of onset: 39) in her father; Lung cancer in her mother; Stroke in her mother.   ROS:  Please see the history of present illness.  No nausea, vomiting.  No fevers, chills.  No focal weakness.  No dysuria. Mild dizziness.    All other systems reviewed and negative.   PHYSICAL EXAM: VS:  BP 82/58  Pulse 75  Ht 5' 1.5" (1.562 m)  Wt 131 lb (59.421 kg)  BMI 24.35 kg/m2 Well nourished, well developed, in no acute distress HEENT: normal Neck: no JVD, no carotid  bruits Cardiac:  normal S1, S2; RRR;  Lungs:  clear to auscultation bilaterally, no wheezing, rhonchi or rales Abd: soft, nontender, no hepatomegaly Ext: no edema Skin: warm and dry Neuro:   no focal abnormalities noted  EKG:      ASSESSMENT AND PLAN:  1. Lateral wall MI: DES in circumflex.  Continue DAPT for at least a year.  Importance of DAPT was stressed to the patient.  She has some atypical chest pains at this time.  No other significant coronary artery disease noted at the time of catheterization.  EF was 40-45% by echocardiogram. 2. HTN: Decrease lisinopril to 2.5 mg daily.  Several low blood pressure readings. She has had significant high readings in the past. I'm hesitant to completely stop the lisinopril. 3. Tobacco abuse: She needs to stop smoking.  She has cut back. 4.   Hyperlipidemia: Will recheck lipids in 3 months.  May need to lower dose of liptor at that point.  LDL was 140 at the time of her MI.  Signed, Fredric Mare, MD, [facility] 02/03/2013 4:01 PM

## 2013-02-05 ENCOUNTER — Telehealth: Payer: Self-pay | Admitting: Interventional Cardiology

## 2013-02-05 NOTE — Telephone Encounter (Signed)
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

## 2013-02-06 ENCOUNTER — Telehealth: Payer: Self-pay | Admitting: Interventional Cardiology

## 2013-02-06 NOTE — Telephone Encounter (Signed)
FMLA refaxed To [facility]/HR Manager [employer] at [phone]

## 2013-02-14 ENCOUNTER — Telehealth: Payer: Self-pay | Admitting: Interventional Cardiology

## 2013-02-14 NOTE — Telephone Encounter (Addendum)
Per Dr. Abe People pt should stop metoprolol as well and continue to monitor BP. Also, pt should make sure she is staying well hydrated. Pt aware and meds updated. Pt will call back if she has any further problems.

## 2013-02-14 NOTE — Telephone Encounter (Signed)
New problem   Pt called to say her BP is to lowe and thinks it is her med.   Today 87/47.   Pt would like a call back please.

## 2013-02-14 NOTE — Telephone Encounter (Signed)
Spoke with pt and she stopped her lisinopril about 4-5 days ago and she is still getting low bp readings. Pt took her last metoprolol tablet last night and now she is out.

## 2013-03-15 ENCOUNTER — Telehealth: Payer: Self-pay | Admitting: Physician Assistant

## 2013-03-15 NOTE — Telephone Encounter (Signed)
Lindsay Camacho called the answering svc c/o waking this AM and observing a small amount of blood on her pillow and dried blood in her R ear canal. She has been on ASA and Plavix since 01/2013 s/p DES placement. She denies ear ache, trauma or dry skin to her R ear. She is otherwise asymptomatic. She does describe experiencing some malaise yesterday. She wondered whether Plavix was the culprit. Advised on the importance to continue ASA, Plavix uninterrupted for 12 months after DES stent placement to prevent stent thrombosis. Stated she may be more prone to bleeding on these meds. Advised to follow-up with her PCP this week for further evaluation. She may have unintentionally caused local trauma to the ear canal while asleep. She understood and agreed.    Jacqulyn Bath, PA-C 03/15/2013 2:52 PM

## 2013-03-17 NOTE — Telephone Encounter (Signed)
agree

## 2013-03-17 NOTE — Telephone Encounter (Signed)
To Odella Aquas, PA-C.

## 2013-04-01 ENCOUNTER — Telehealth: Payer: Self-pay | Admitting: *Deleted

## 2013-04-01 DIAGNOSIS — I2510 Atherosclerotic heart disease of native coronary artery without angina pectoris: Secondary | ICD-10-CM

## 2013-04-01 NOTE — Telephone Encounter (Signed)
To Dr. Varanasi, please advise.  

## 2013-04-01 NOTE — Telephone Encounter (Signed)
Trial candidate?

## 2013-04-01 NOTE — Telephone Encounter (Signed)
Patient called stating she is sure that her lipitor is causing muscle cramps in her arms and legs, wants a different cholesterol medication. Please advise.

## 2013-04-02 NOTE — Telephone Encounter (Signed)
Patient with recent MI having leg cramps on lipitor 80 mg qd.  LDL was 140 mg/dL in 02/1306 at time of MI.  She wouldn't qualify for study unless she has failed at least 2 statins, one of which is at the lowest possible dose.  She's only failed one statin at this time, so wouldn't qualify.  I would suggest starting her on Crestor 10 mg once daily, and if muscle aches occur on this to reduce to 5 mg qd.  If muscle aches occur on 5 mg daily as well, she should call us to let me know.  She would actually qualify for study at that time if she couldn't tolerate Crestor 5 mg qd.  If this occurs we would need to consider trying another statin vs enrolling into study.  I would plan on rechecking lipid / liver 6 weeks after starting Crestor.  To Dr. Eldridge Dace to consider.

## 2013-04-09 MED ORDER — ROSUVASTATIN CALCIUM 10 MG PO TABS: ORAL_TABLET | ORAL | Status: DC

## 2013-04-09 NOTE — Telephone Encounter (Signed)
Please follow Jeremy's recs from 4/1

## 2013-04-09 NOTE — Telephone Encounter (Signed)
Pt notified, meds updated and samples up front. Lab already scheduled.

## 2013-04-11 ENCOUNTER — Inpatient Hospital Stay (HOSPITAL_COMMUNITY)
Admission: EM | Admit: 2013-04-11 | Discharge: 2013-04-18 | DRG: 252 | Disposition: A | Discharge status: Home / Self Care | Payer: BC Managed Care – PPO | Attending: Cardiology | Admitting: Cardiology

## 2013-04-11 ENCOUNTER — Observation Stay (HOSPITAL_COMMUNITY): Payer: BC Managed Care – PPO

## 2013-04-11 ENCOUNTER — Other Ambulatory Visit: Payer: Self-pay

## 2013-04-11 ENCOUNTER — Encounter (HOSPITAL_COMMUNITY)
Admission: EM | Disposition: A | Discharge status: Home / Self Care | Payer: Self-pay | Source: Home / Self Care | Attending: Cardiology

## 2013-04-11 ENCOUNTER — Ambulatory Visit (HOSPITAL_COMMUNITY): Admit: 2013-04-11 | Payer: Self-pay | Admitting: Cardiovascular Disease

## 2013-04-11 ENCOUNTER — Encounter (HOSPITAL_COMMUNITY): Payer: Self-pay | Admitting: Emergency Medicine

## 2013-04-11 DIAGNOSIS — I2510 Atherosclerotic heart disease of native coronary artery without angina pectoris: Secondary | ICD-10-CM

## 2013-04-11 DIAGNOSIS — I252 Old myocardial infarction: Secondary | ICD-10-CM

## 2013-04-11 DIAGNOSIS — R079 Chest pain, unspecified: Secondary | ICD-10-CM | POA: Diagnosis present

## 2013-04-11 DIAGNOSIS — Z880 Allergy status to penicillin: Secondary | ICD-10-CM

## 2013-04-11 DIAGNOSIS — Z7982 Long term (current) use of aspirin: Secondary | ICD-10-CM

## 2013-04-11 DIAGNOSIS — I959 Hypotension, unspecified: Secondary | ICD-10-CM | POA: Diagnosis present

## 2013-04-11 DIAGNOSIS — E785 Hyperlipidemia, unspecified: Secondary | ICD-10-CM | POA: Diagnosis present

## 2013-04-11 DIAGNOSIS — R0789 Other chest pain: Secondary | ICD-10-CM

## 2013-04-11 DIAGNOSIS — Z7902 Long term (current) use of antithrombotics/antiplatelets: Secondary | ICD-10-CM

## 2013-04-11 DIAGNOSIS — Z823 Family history of stroke: Secondary | ICD-10-CM

## 2013-04-11 DIAGNOSIS — I200 Unstable angina: Secondary | ICD-10-CM | POA: Diagnosis present

## 2013-04-11 DIAGNOSIS — R011 Cardiac murmur, unspecified: Secondary | ICD-10-CM | POA: Diagnosis present

## 2013-04-11 DIAGNOSIS — Z720 Tobacco use: Secondary | ICD-10-CM

## 2013-04-11 DIAGNOSIS — F17200 Nicotine dependence, unspecified, uncomplicated: Secondary | ICD-10-CM | POA: Diagnosis present

## 2013-04-11 DIAGNOSIS — I70209 Unspecified atherosclerosis of native arteries of extremities, unspecified extremity: Secondary | ICD-10-CM | POA: Diagnosis present

## 2013-04-11 DIAGNOSIS — D62 Acute posthemorrhagic anemia: Secondary | ICD-10-CM | POA: Diagnosis not present

## 2013-04-11 DIAGNOSIS — I2589 Other forms of chronic ischemic heart disease: Secondary | ICD-10-CM | POA: Diagnosis present

## 2013-04-11 DIAGNOSIS — Z9861 Coronary angioplasty status: Secondary | ICD-10-CM

## 2013-04-11 DIAGNOSIS — R58 Hemorrhage, not elsewhere classified: Secondary | ICD-10-CM | POA: Diagnosis not present

## 2013-04-11 DIAGNOSIS — Z8249 Family history of ischemic heart disease and other diseases of the circulatory system: Secondary | ICD-10-CM

## 2013-04-11 DIAGNOSIS — E876 Hypokalemia: Secondary | ICD-10-CM

## 2013-04-11 DIAGNOSIS — M940 Chondrocostal junction syndrome [Tietze]: Secondary | ICD-10-CM | POA: Diagnosis present

## 2013-04-11 DIAGNOSIS — I10 Essential (primary) hypertension: Secondary | ICD-10-CM

## 2013-04-11 DIAGNOSIS — I249 Acute ischemic heart disease, unspecified: Secondary | ICD-10-CM

## 2013-04-11 DIAGNOSIS — Z79899 Other long term (current) drug therapy: Secondary | ICD-10-CM

## 2013-04-11 DIAGNOSIS — K661 Hemoperitoneum: Secondary | ICD-10-CM | POA: Diagnosis present

## 2013-04-11 DIAGNOSIS — I255 Ischemic cardiomyopathy: Secondary | ICD-10-CM

## 2013-04-11 DIAGNOSIS — D500 Iron deficiency anemia secondary to blood loss (chronic): Secondary | ICD-10-CM | POA: Diagnosis not present

## 2013-04-11 DIAGNOSIS — Z8673 Personal history of transient ischemic attack (TIA), and cerebral infarction without residual deficits: Secondary | ICD-10-CM

## 2013-04-11 DIAGNOSIS — I447 Left bundle-branch block, unspecified: Secondary | ICD-10-CM

## 2013-04-11 HISTORY — DX: Unstable angina: I20.0

## 2013-04-11 LAB — CBC
HCT: 42.4 % (ref 36.0–46.0)
HCT: 38.3 % (ref 36.0–46.0)
Hemoglobin: 14.6 g/dL (ref 12.0–15.0)
Hemoglobin: 12.6 g/dL (ref 12.0–15.0)
MCH: 30.2 pg (ref 26.0–34.0)
MCH: 29.4 pg (ref 26.0–34.0)
MCHC: 34.4 g/dL (ref 30.0–36.0)
MCHC: 32.9 g/dL (ref 30.0–36.0)
MCV: 87.6 fL (ref 78.0–100.0)
MCV: 89.5 fL (ref 78.0–100.0)
PLATELETS: 211 10*3/uL (ref 150–400)
Platelets: 157 10*3/uL (ref 150–400)
RBC: 4.84 MIL/uL (ref 3.87–5.11)
RBC: 4.28 MIL/uL (ref 3.87–5.11)
RDW: 13.9 % (ref 11.5–15.5)
RDW: 13.9 % (ref 11.5–15.5)
WBC: 8.3 10*3/uL (ref 4.0–10.5)
WBC: 6.5 10*3/uL (ref 4.0–10.5)

## 2013-04-11 LAB — BASIC METABOLIC PANEL
BUN: 8 mg/dL (ref 6–23)
BUN: 8 mg/dL (ref 6–23)
CALCIUM: 9.5 mg/dL (ref 8.4–10.5)
CALCIUM: 8.7 mg/dL (ref 8.4–10.5)
CHLORIDE: 111 meq/L (ref 96–112)
CO2: 21 mEq/L (ref 19–32)
CO2: 23 meq/L (ref 19–32)
CREATININE: 0.63 mg/dL (ref 0.50–1.10)
Chloride: 101 mEq/L (ref 96–112)
Creatinine, Ser: 0.65 mg/dL (ref 0.50–1.10)
GFR calc Af Amer: >90 mL/min (ref >90)
GFR calc Af Amer: >90 mL/min (ref >90)
GFR calc non Af Amer: >90 mL/min (ref >90)
GFR calc non Af Amer: >90 mL/min (ref >90)
GLUCOSE: 101 mg/dL — AB (ref 70–99)
Glucose, Bld: 82 mg/dL (ref 70–99)
Potassium: 3.1 mEq/L — ABNORMAL LOW (ref 3.7–5.3)
Potassium: 4.3 mEq/L (ref 3.7–5.3)
Sodium: 141 mEq/L (ref 137–147)
Sodium: 144 mEq/L (ref 137–147)

## 2013-04-11 LAB — DIFFERENTIAL
BASOS ABS: 0 10*3/uL (ref 0.0–0.1)
Basophils Relative: 0 % (ref 0–1)
EOS PCT: 3 % (ref 0–5)
Eosinophils Absolute: 0.3 10*3/uL (ref 0.0–0.7)
LYMPHS PCT: 28 % (ref 12–46)
Lymphs Abs: 2.3 10*3/uL (ref 0.7–4.0)
Monocytes Absolute: 0.5 10*3/uL (ref 0.1–1.0)
Monocytes Relative: 6 % (ref 3–12)
NEUTROS PCT: 63 % (ref 43–77)
Neutro Abs: 5.2 10*3/uL (ref 1.7–7.7)

## 2013-04-11 LAB — PROTIME-INR
INR: 0.98 (ref 0.00–1.49)
PROTHROMBIN TIME: 12.8 s (ref 11.6–15.2)

## 2013-04-11 LAB — I-STAT TROPONIN, ED: Troponin i, poc: 0.03 ng/mL (ref 0.00–0.08)

## 2013-04-11 LAB — APTT: aPTT: 21 seconds — ABNORMAL LOW (ref 24–37)

## 2013-04-11 LAB — TROPONIN I
Troponin I: <0.3 ng/mL (ref <0.30)
Troponin I: <0.3 ng/mL (ref <0.30)

## 2013-04-11 SURGERY — LEFT HEART CATHETERIZATION WITH CORONARY ANGIOGRAM
Anesthesia: Choice | Laterality: Bilateral

## 2013-04-11 MED ORDER — NITROGLYCERIN 2 % TD OINT
1.0000 [in_us] | TOPICAL_OINTMENT | Freq: Four times a day (QID) | TRANSDERMAL | Status: DC
  Administered 2013-04-11 – 2013-04-15 (×11): 1 [in_us] via TOPICAL
  Filled 2013-04-11 (×2): qty 30

## 2013-04-11 MED ORDER — POTASSIUM CHLORIDE CRYS ER 20 MEQ PO TBCR
40.0000 meq | EXTENDED_RELEASE_TABLET | Freq: Once | ORAL | Status: AC
  Administered 2013-04-11: 40 meq via ORAL
  Filled 2013-04-11: qty 2

## 2013-04-11 MED ORDER — HEPARIN SODIUM (PORCINE) 5000 UNIT/ML IJ SOLN
5000.0000 [IU] | Freq: Three times a day (TID) | INTRAMUSCULAR | Status: DC
  Administered 2013-04-11: 5000 [IU] via SUBCUTANEOUS
  Filled 2013-04-11 (×4): qty 1

## 2013-04-11 MED ORDER — MORPHINE SULFATE 2 MG/ML IJ SOLN
2.0000 mg | INTRAMUSCULAR | Status: DC | PRN
  Administered 2013-04-11 – 2013-04-12 (×3): 2 mg via INTRAVENOUS
  Administered 2013-04-12: 4 mg via INTRAVENOUS
  Administered 2013-04-12 (×3): 2 mg via INTRAVENOUS
  Administered 2013-04-13: 4 mg via INTRAVENOUS
  Administered 2013-04-13 – 2013-04-16 (×8): 2 mg via INTRAVENOUS
  Filled 2013-04-11: qty 1
  Filled 2013-04-11: qty 2
  Filled 2013-04-11 (×11): qty 1
  Filled 2013-04-11: qty 2
  Filled 2013-04-11: qty 1

## 2013-04-11 MED ORDER — HEPARIN SODIUM (PORCINE) 5000 UNIT/ML IJ SOLN: 60.0000 [IU]/kg | Freq: Once | INTRAMUSCULAR | Status: DC

## 2013-04-11 MED ORDER — TECHNETIUM TC 99M SESTAMIBI GENERIC - CARDIOLITE
10.0000 | Freq: Once | INTRAVENOUS | Status: AC | PRN
  Administered 2013-04-11: 10 via INTRAVENOUS

## 2013-04-11 MED ORDER — ACETAMINOPHEN 325 MG PO TABS
ORAL_TABLET | ORAL | Status: AC
  Filled 2013-04-11: qty 3

## 2013-04-11 MED ORDER — IPRATROPIUM-ALBUTEROL 0.5-2.5 (3) MG/3ML IN SOLN
3.0000 mL | Freq: Once | RESPIRATORY_TRACT | Status: AC
  Administered 2013-04-11: 3 mL via RESPIRATORY_TRACT
  Filled 2013-04-11: qty 3

## 2013-04-11 MED ORDER — HEPARIN BOLUS VIA INFUSION
4000.0000 [IU] | Freq: Once | INTRAVENOUS | Status: AC
  Administered 2013-04-11: 4000 [IU] via INTRAVENOUS
  Filled 2013-04-11: qty 4000

## 2013-04-11 MED ORDER — NITROGLYCERIN 0.4 MG SL SUBL
0.4000 mg | SUBLINGUAL_TABLET | SUBLINGUAL | Status: DC | PRN
  Administered 2013-04-11 (×4): 0.4 mg via SUBLINGUAL
  Filled 2013-04-11: qty 1

## 2013-04-11 MED ORDER — SODIUM CHLORIDE 0.9 % IJ SOLN
3.0000 mL | INTRAMUSCULAR | Status: DC | PRN
  Administered 2013-04-11: 3 mL via INTRAVENOUS

## 2013-04-11 MED ORDER — MORPHINE SULFATE 4 MG/ML IJ SOLN
4.0000 mg | Freq: Once | INTRAMUSCULAR | Status: AC
  Administered 2013-04-11: 4 mg via INTRAVENOUS
  Filled 2013-04-11: qty 1

## 2013-04-11 MED ORDER — ASPIRIN 81 MG PO CHEW: 324.0000 mg | CHEWABLE_TABLET | Freq: Once | ORAL | Status: DC

## 2013-04-11 MED ORDER — POTASSIUM CHLORIDE 10 MEQ/100ML IV SOLN
10.0000 meq | Freq: Once | INTRAVENOUS | Status: AC
  Administered 2013-04-11: 10 meq via INTRAVENOUS
  Filled 2013-04-11: qty 100

## 2013-04-11 MED ORDER — CLOPIDOGREL BISULFATE 75 MG PO TABS
75.0000 mg | ORAL_TABLET | Freq: Every day | ORAL | Status: DC
  Administered 2013-04-11 – 2013-04-14 (×4): 75 mg via ORAL
  Filled 2013-04-11 (×4): qty 1

## 2013-04-11 MED ORDER — REGADENOSON 0.4 MG/5ML IV SOLN
INTRAVENOUS | Status: AC
  Administered 2013-04-11: 0.4 mg via INTRAVENOUS
  Filled 2013-04-11: qty 5

## 2013-04-11 MED ORDER — TRAMADOL HCL 50 MG PO TABS
50.0000 mg | ORAL_TABLET | Freq: Four times a day (QID) | ORAL | Status: DC | PRN
  Administered 2013-04-11 – 2013-04-17 (×10): 100 mg via ORAL
  Filled 2013-04-11 (×11): qty 2

## 2013-04-11 MED ORDER — ASPIRIN 81 MG PO CHEW
81.0000 mg | CHEWABLE_TABLET | Freq: Every day | ORAL | Status: DC
  Administered 2013-04-11 – 2013-04-13 (×3): 81 mg via ORAL
  Filled 2013-04-11 (×3): qty 1

## 2013-04-11 MED ORDER — SODIUM CHLORIDE 0.9 % IJ SOLN
3.0000 mL | Freq: Two times a day (BID) | INTRAMUSCULAR | Status: DC
  Administered 2013-04-11 – 2013-04-18 (×6): 3 mL via INTRAVENOUS

## 2013-04-11 MED ORDER — ACETAMINOPHEN 325 MG PO TABS
975.0000 mg | ORAL_TABLET | Freq: Three times a day (TID) | ORAL | Status: DC | PRN
  Administered 2013-04-11: 975 mg via ORAL

## 2013-04-11 MED ORDER — REGADENOSON 0.4 MG/5ML IV SOLN
0.4000 mg | Freq: Once | INTRAVENOUS | Status: AC
  Administered 2013-04-11: 0.4 mg via INTRAVENOUS
  Filled 2013-04-11: qty 5

## 2013-04-11 MED ORDER — HEPARIN (PORCINE) IN NACL 100-0.45 UNIT/ML-% IJ SOLN
900.0000 [IU]/h | INTRAMUSCULAR | Status: DC
  Administered 2013-04-11 – 2013-04-13 (×3): 900 [IU]/h via INTRAVENOUS
  Filled 2013-04-11 (×4): qty 250

## 2013-04-11 MED ORDER — SODIUM CHLORIDE 0.9 % IV SOLN
250.0000 mL | INTRAVENOUS | Status: DC | PRN
  Administered 2013-04-14: 12:00:00 via INTRAVENOUS
  Administered 2013-04-14: 250 mL via INTRAVENOUS

## 2013-04-11 MED ORDER — TECHNETIUM TC 99M SESTAMIBI GENERIC - CARDIOLITE
30.0000 | Freq: Once | INTRAVENOUS | Status: AC | PRN
  Administered 2013-04-11: 30 via INTRAVENOUS

## 2013-04-11 MED ORDER — NICOTINE 14 MG/24HR TD PT24
14.0000 mg | MEDICATED_PATCH | Freq: Every day | TRANSDERMAL | Status: DC
  Administered 2013-04-11 – 2013-04-18 (×8): 14 mg via TRANSDERMAL
  Filled 2013-04-11 (×12): qty 1

## 2013-04-11 NOTE — Progress Notes (Signed)
Received a call from RN regarding chest pain.  [name] has had intermittent chest pain since January. She came in to the hospital yesterday with these symptoms and her cardiac enzymes are negative but her Myoview was significantly abnormal. She has continued to complain of pain at times. She's been given one sublingual nitroglycerin with no change. Her ECG has an underlying left bundle branch block, making interpretation difficult.  Will review ECG with M.D. to make sure no acute ischemic changes are present. Will add morphine, tramadol for pain control as well as nitroglycerin paste for possible angina. Will get cardiac enzymes cycled again. Followup on results.

## 2013-04-11 NOTE — ED Notes (Addendum)
Pt arrives via EMS from work. C.o increasing chest pain all day. Pt took 2 NTG at work and then EMS gave pt 2 NTG. Pt initially stated that her pain went away after EMS's 1st dose of NTG but pain quickly returned. EMS gave 324 ASA. EMS reports that when they came on scene pt was unresponsive and blue. EMS ventilated pt with bag valve mask and pt came around.Pt has hx MI in January and 1 stent. Hx HTN, stroke. Pt takes daily ASA. BP 150/90 HR 104.

## 2013-04-11 NOTE — Progress Notes (Signed)
Lexiscan CL performed. Pt chest pain unchanged, ordered Tylenol.

## 2013-04-11 NOTE — H&P (Addendum)
History and Physical  Patient ID: Lindsay Camacho MRN: [id number], SOB: June 29, 1965 48 y.o. Date of Encounter: 04/11/2013, 3:38 AM  Primary Physician: No PCP Per Patient Primary Cardiologist: Dr. Eldridge Dace  Chief Complaint: chest pain  HPI: 48 y.o. female w/ PMHx significant for CAD s/p NSTEMI 02/2013 s/p DES to LCx, LBBB who presented to [facility] on 04/11/2013 with complaints of chest pain. Initially billed as STEMI, her chronic LBBB was unchanged from prior and the STEMI was canceled. EMS reports when they first picked her up she was in distress and required facemask oxygen. Pain was relieved by 1 nitro but then returned upon entering the hospital.  She describes doing well after the PCI back February and she returned to work. However, she was transitioned at her work to increased physical labor which she reports has been worsening her chest pain.   She actually called earlier in the evening regarding chest pain and I encouraged her to come to the ER. At that time she was also having difficulty at work as she wanted me to talk with her boss at that time. I indicated she needed to dial 911 and come to the hospital.  Compliant with plavix, aspirin. Has not switched yet to crestor from lipitor which caused aches.   EKG revealed sinus tach with LBBB. CXR not done, ordered. Labs are significant for mild hypokalemia.   Past Medical History  Diagnosis Date  . Hypertension   . Stroke     tia with no deficits  . CAD (coronary artery disease) 11/13/2011    50% ? proximal LCx stenosis per Cath at [facility]  . PVD (peripheral vascular disease)     Right CFA stenosis per report on 11/14/2011  . HLD (hyperlipidemia)   . LBBB (left bundle branch block)   . Acute myocardial infarction of other lateral wall, initial episode of care      Surgical History:  Past Surgical History  Procedure Laterality Date  . Cardiac catheterization    . Tubal ligation    . Appendectomy       Home Meds: Prior  to Admission medications   Medication Sig Start Date End Date Taking? Authorizing Provider  aspirin 81 MG tablet Take 1 tablet (81 mg total) by mouth daily. 10/08/12  Yes Christiane Ha, MD  atorvastatin (LIPITOR) 80 MG tablet Take 1 tablet (80 mg total) by mouth daily. 01/20/13  Yes Abelino Derrick, PA-C  clopidogrel (PLAVIX) 75 MG tablet Take 1 tablet (75 mg total) by mouth daily with breakfast. 01/20/13  Yes Abelino Derrick, PA-C  nitroGLYCERIN (NITROSTAT) 0.4 MG SL tablet Place 1 tablet (0.4 mg total) under the tongue every 5 (five) minutes x 3 doses as needed for chest pain. 01/20/13  Yes Luke K Kilroy, PA-C  rosuvastatin (CRESTOR) 10 MG tablet 1 tablet daily and if you cant tolerate 10 mg, then reduce to 5 mg daily. 04/09/13   Corky Crafts, MD    Allergies:  Allergies  Allergen Reactions  . Penicillins Rash    History   Social History  . Marital Status: Married    Spouse Name: N/A    Number of Children: N/A  . Years of Education: N/A   Occupational History  . Not on file.   Social History Main Topics  . Smoking status: Current Every Day Smoker -- 0.50 packs/day  . Smokeless tobacco: Never Used  . Alcohol Use: No  . Drug Use: Yes    Special: Marijuana  .  Sexual Activity: Yes    Birth Control/ Protection: None   Other Topics Concern  . Not on file   Social History Narrative  . No narrative on file     Family History  Problem Relation Age of Onset  . CAD Mother 2454  . Lung cancer Mother   . Bladder Cancer Mother   . Stroke Mother   . CAD Father 8965    Review of Systems: General: negative for chills, fever, night sweats or weight changes.  Cardiovascular: see HPI Dermatological: negative for rash Respiratory: negative for cough or wheezing Urologic: negative for hematuria Abdominal: negative for nausea, vomiting, diarrhea, bright red blood per rectum, melena, or hematemesis Neurologic: negative for visual changes, syncope, or dizziness All other systems  reviewed and are otherwise negative except as noted above.  Labs:   Lab Results  Component Value Date   WBC 8.3 04/11/2013   HGB 14.6 04/11/2013   HCT 42.4 04/11/2013   MCV 87.6 04/11/2013   PLT 211 04/11/2013    Recent Labs Lab 04/11/13 0143  NA 141  K 3.1*  CL 101  CO2 21  BUN 8  CREATININE 0.65  CALCIUM 9.5  GLUCOSE 101*   No results found for this basename: CKTOTAL, CKMB, TROPONINI,  in the last 72 hours Lab Results  Component Value Date   CHOL 191 01/20/2013   HDL 28* 01/20/2013   LDLCALC 140* 01/20/2013   TRIG 117 01/20/2013   Lab Results  Component Value Date   DDIMER 0.37 10/08/2012    Radiology/Studies:  No results found.   EKG: sinus, LBBB  Physical Exam: Blood pressure 123/92, temperature 97.6 F (36.4 C), temperature source Oral, resp. rate 21, height 5\' 2"  (1.575 m), weight 59.875 kg (132 lb). General: appears much older than stated age, anxious Head: Normocephalic, atraumatic, sclera non-icteric, nares are without discharge Neck: Supple. Negative for carotid bruits. JVD not elevated. Very tender to palpation along the left ribs beneath arm pit. Lungs: Clear bilaterally to auscultation without wheezes, rales, or rhonchi. Breathing is unlabored. Heart: RRR with S1 S2. No murmurs, rubs, or gallops appreciated. Abdomen: Soft, non-tender, non-distended with normoactive bowel sounds. No rebound/guarding. No obvious abdominal masses. Msk:  Strength and tone appear normal for age. Extremities: No edema. No clubbing or cyanosis. Distal pedal pulses are 2+ and equal bilaterally. Neuro: Alert and oriented X 3. Moves all extremities spontaneously. Psych:  Responds to questions appropriately with a normal affect.    ASSESSMENT AND PLAN:  1. Chest pain, atypical 2. CAD s/p recent NSTEMI s/p PCI 3. Tobacco abuse 4. Hyperlipidemia 5. Psychosocial stressors  48 y.o. female w/ PMHx significant for CAD s/p NSTEMI 02/2013 s/p DES to LCx, LBBB who [facility] with complaints of chest pain. Initially billed a STEMI but EKG unchanged from prior.  Encouragingly, her troponins are negative despite the severity and length of her chest pain. Exam is highly suggestive of MSK cause given that the pain is easily palpable. Will monitor with serial enzymes and telemetry but I anticipate if labs are normal, she can likely be discharged. cxray pending.  Continue aspirin, plavix. Will not order statin as she is intolerant of atorvastatin and has crestor at home.  NPO just in case further testing warranted but unlikely.  Concern for secondary gain at work motivating some of her symptoms.    Signed, Ardis RowanMatthew Ramesha Poster MD 04/11/2013, 3:38 AM

## 2013-04-11 NOTE — Progress Notes (Signed)
ANTICOAGULATION CONSULT NOTE - Initial Consult  Pharmacy Consult for Heparin  Indication:  ACS  Allergies  Allergen Reactions  . Brilinta [Ticagrelor]     Dyspnea  . Other Other (See Comments)    BP med ?amaldemac. BP bottomed out  . Penicillins Rash   Patient Measurements: Height: 5\' 2"  (157.5 cm) Weight: 122 lb 6.4 oz (55.52 kg) IBW/kg (Calculated) : 50.1  Vital Signs: BP: 93/74 mmHg (04/10 1251)  Labs:  Recent Labs  04/11/13 0143 04/11/13 0910 04/11/13 0921 04/11/13 1435  HGB 14.6 12.6  --   --   HCT 42.4 38.3  --   --   PLT 211 157  --   --   APTT 21*  --   --   --   LABPROT 12.8  --   --   --   INR 0.98  --   --   --   CREATININE 0.65 0.63  --   --   TROPONINI  --   --  <0.30 <0.30   Estimated Creatinine Clearance: 68.8 ml/min (by C-G formula based on Cr of 0.63).  Medical History: Past Medical History  Diagnosis Date  . Hypertension   . Stroke     tia with no deficits  . CAD (coronary artery disease) 11/13/2011    50% ? proximal LCx stenosis [facility]  . PVD (peripheral vascular disease)     Right CFA stenosis per report on 11/14/2011  . HLD (hyperlipidemia)   . LBBB (left bundle branch block)   . Acute myocardial infarction of other lateral wall, initial episode of care    Medications:  Prescriptions prior to admission  Medication Sig Dispense Refill  . aspirin 81 MG tablet Take 1 tablet (81 mg total) by mouth daily.  30 tablet    . atorvastatin (LIPITOR) 80 MG tablet Take 1 tablet (80 mg total) by mouth daily.  90 tablet  3  . clopidogrel (PLAVIX) 75 MG tablet Take 1 tablet (75 mg total) by mouth daily with breakfast.  90 tablet  3  . nitroGLYCERIN (NITROSTAT) 0.4 MG SL tablet Place 1 tablet (0.4 mg total) under the tongue every 5 (five) minutes x 3 doses as needed for chest pain.  25 tablet  2  . rosuvastatin (CRESTOR) 10 MG tablet 1 tablet daily and if you cant tolerate 10 mg, then reduce to 5 mg daily.  90 tablet  3   Assessment: [age] yof  admitted with history of CAD and c/o chest pain.  She underwent a Myoview study which returns abnormal.  The plan is to medically manage until cardiac cath procedure on Monday unless she has ongoing chest pain.  We have been asked to dose her IV Heparin.  Her CBC is stable with H/H of 12.6/38.3 and a platelet count of 157K.  Her troponin labs have all been negative.  She has a normal creatinine of 0.6 with an estimated clearance of 5569ml/min.  She was on oral Plavix prior to admit.  Goal of Therapy:  Heparin level 0.3-0.7 units/ml Monitor platelets by anticoagulation protocol: Yes   Plan:  1.  Heparin 4000 units IV bolus x 1 2.  Heparin infusion at 900 units/hr 3.  Will check a heparin level 8 hours after starting 4.  Daily CBC and heparin levels 5.  Monitor closely for bleeding complications  Nadara MustardNita Tranesha Lessner, PharmD., MS Clinical Pharmacist Pager:  [phone] Thank you for allowing pharmacy to be part of this patients care team.  04/11/2013,6:45 PM

## 2013-04-11 NOTE — Progress Notes (Signed)
    NST returned abnormal.This is interpreted as an high risk Myoview study. 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 There is also evidence of inferior lateral basal ischemia. The left ventricular systolic function is moderately reduced with a left ventricular EF of 36%. I notified patient of result. She is scheduled for a LHC on Monday 04/14/13. She is with mild 4/10 chest discomfort. We will plan to treat medically until cath on Monday, unless she develops any severe chest pain with evidence of ST elevations. Will place on IV heparin. BP will not support IV nitro as systolic is in the low 90s.   Robbie Lis, PA-C

## 2013-04-11 NOTE — Progress Notes (Addendum)
0915:  Patient complained of 4 out of 10 chest pain, she was diaphoretic at the time            B/P=  123/60  HR=  67  4 L O2 Walsh applied and EKG obtained  0921:  1 Sublingual nitro give  0926:  BP=  119/66  HR= 82            Patient's pain= 4 out of 10  0927:  1 sublingual nitro given  0932:  BP= 113/66   HR= 81            Patient's pain=  4  out of 10,  No diaphoresis noted   0932:  1 sublingual nitro given  0937:  Patient's pain=  4   out of 10             B/P=120/61  HR=  77  Dr. Excell Seltzer now at beside and is aware

## 2013-04-11 NOTE — ED Provider Notes (Signed)
CSN: [id number]     Arrival date & time 04/11/13  0110 History   First MD Initiated Contact with Patient 04/11/13 0118     Chief Complaint  Patient presents with  . Code STEMI     (Consider location/radiation/quality/duration/timing/severity/associated sxs/prior Treatment) The history is provided by the patient and the EMS personnel.   48 year old female has been having chest pain throughout the day. She is very vague historian but she did take several nitroglycerin pills at home with no benefit. There was associated dyspnea but no nausea or diaphoresis. EMS reports that when they responded, she was unresponsive and blue and they had.mask. If she responded to that and they gave her 2 nitroglycerin with significant improvement in pain. Pain was at 10/10 at home and is down to 4/10. They transported her to the ED as a code STEMI.  Past Medical History  Diagnosis Date  . Hypertension   . Stroke     tia with no deficits  . CAD (coronary artery disease) 11/13/2011    50% ? proximal LCx stenosis per Cath at [facility]  . PVD (peripheral vascular disease)     Right CFA stenosis per report on 11/14/2011  . HLD (hyperlipidemia)   . LBBB (left bundle branch block)   . Acute myocardial infarction of other lateral wall, initial episode of care    Past Surgical History  Procedure Laterality Date  . Cardiac catheterization    . Tubal ligation    . Appendectomy     Family History  Problem Relation Age of Onset  . CAD Mother 34  . Lung cancer Mother   . Bladder Cancer Mother   . Stroke Mother   . CAD Father 21   History  Substance Use Topics  . Smoking status: Current Every Day Smoker -- 0.50 packs/day  . Smokeless tobacco: Never Used  . Alcohol Use: No   OB History   Grav Para Term Preterm Abortions TAB SAB Ect Mult Living                 Review of Systems  All other systems reviewed and are negative.     Allergies  Penicillins  Home Medications   Current Outpatient Rx  Name   Route  Sig  Dispense  Refill  . acetaminophen (TYLENOL) 325 MG tablet   Oral   Take 2 tablets (650 mg total) by mouth every 4 (four) hours as needed for headache or mild pain.         Marland Kitchen aspirin 81 MG tablet   Oral   Take 1 tablet (81 mg total) by mouth daily.   30 tablet      . atorvastatin (LIPITOR) 80 MG tablet   Oral   Take 1 tablet (80 mg total) by mouth daily.   90 tablet   3   . cephALEXin (KEFLEX) 500 MG capsule               . clopidogrel (PLAVIX) 75 MG tablet   Oral   Take 1 tablet (75 mg total) by mouth daily with breakfast.   90 tablet   3   . diphenhydrAMINE (BENADRYL) 25 mg capsule   Oral   Take 1 capsule (25 mg total) by mouth every 6 (six) hours as needed (Itching).   30 capsule   0   . hydrocortisone cream 0.5 %   Topical   Apply topically 2 (two) times daily.   30 g   0   .  naproxen (NAPROSYN) 500 MG tablet               . nicotine (NICODERM CQ - DOSED IN MG/24 HOURS) 14 mg/24hr patch   Transdermal   Place 1 patch (14 mg total) onto the skin daily.   28 patch   0   . nitroGLYCERIN (NITROSTAT) 0.4 MG SL tablet   Sublingual   Place 1 tablet (0.4 mg total) under the tongue every 5 (five) minutes x 3 doses as needed for chest pain.   25 tablet   2   . oxyCODONE-acetaminophen (PERCOCET/ROXICET) 5-325 MG per tablet   Oral   Take 1 tablet by mouth every 4 (four) hours as needed for severe pain.   30 tablet   0   . pantoprazole (PROTONIX) 40 MG tablet   Oral   Take 1 tablet (40 mg total) by mouth daily at 6 (six) AM.   90 tablet   3   . predniSONE (DELTASONE) 10 MG tablet               . rosuvastatin (CRESTOR) 10 MG tablet      1 tablet daily and if you cant tolerate 10 mg, then reduce to 5 mg daily.   90 tablet   3    BP 123/92  Temp(Src) 97.6 F (36.4 C) (Oral)  Resp 21  Ht 5\' 2"  (1.575 m)  Wt 132 lb (59.875 kg)  BMI 24.14 kg/m2 Physical Exam  Nursing note and vitals reviewed.  48 year old female, who is very  anxious, but is in no acute distress. Vital signs are significant for tachypnea with respiratory rate of 21, and mild diastolic hypertension with blood pressure 123/92. Oxygen saturation is 98%, which is normal. Head is normocephalic and atraumatic. PERRLA, EOMI. Oropharynx is clear. Neck is nontender and supple without adenopathy or JVD. Back is nontender and there is no CVA tenderness. Lungs have prolonged exhalation phase with scattered expiratory wheezes. There are no rales or rhonchi. Chest is nontender. Heart has regular rate and rhythm without murmur. Abdomen is soft, flat, nontender without masses or hepatosplenomegaly and peristalsis is normoactive. Extremities have no cyanosis or edema, full range of motion is present. Skin is warm and dry without rash. Neurologic: Mental status is normal except for anxiety, cranial nerves are intact, there are no motor or sensory deficits.  ED Course  Procedures (including critical care time) Labs Review Results for orders placed during the hospital encounter of 04/11/13  CBC      Result Value Ref Range   WBC 8.3  4.0 - 10.5 K/uL   RBC 4.84  3.87 - 5.11 MIL/uL   Hemoglobin 14.6  12.0 - 15.0 g/dL   HCT 40.942.4  81.136.0 - 91.446.0 %   MCV 87.6  78.0 - 100.0 fL   MCH 30.2  26.0 - 34.0 pg   MCHC 34.4  30.0 - 36.0 g/dL   RDW 78.213.9  95.611.5 - 21.315.5 %   Platelets 211  150 - 400 K/uL  DIFFERENTIAL      Result Value Ref Range   Neutrophils Relative % 63  43 - 77 %   Neutro Abs 5.2  1.7 - 7.7 K/uL   Lymphocytes Relative 28  12 - 46 %   Lymphs Abs 2.3  0.7 - 4.0 K/uL   Monocytes Relative 6  3 - 12 %   Monocytes Absolute 0.5  0.1 - 1.0 K/uL   Eosinophils Relative 3  0 - 5 %  Eosinophils Absolute 0.3  0.0 - 0.7 K/uL   Basophils Relative 0  0 - 1 %   Basophils Absolute 0.0  0.0 - 0.1 K/uL  PROTIME-INR      Result Value Ref Range   Prothrombin Time 12.8  11.6 - 15.2 seconds   INR 0.98  0.00 - 1.49  APTT      Result Value Ref Range   aPTT 21 (*) 24 - 37  seconds  BASIC METABOLIC PANEL      Result Value Ref Range   Sodium 141  137 - 147 mEq/L   Potassium 3.1 (*) 3.7 - 5.3 mEq/L   Chloride 101  96 - 112 mEq/L   CO2 21  19 - 32 mEq/L   Glucose, Bld 101 (*) 70 - 99 mg/dL   BUN 8  6 - 23 mg/dL   Creatinine, Ser 4.09  0.50 - 1.10 mg/dL   Calcium 9.5  8.4 - 81.1 mg/dL   GFR calc non Af Amer >90  >90 mL/min   GFR calc Af Amer >90  >90 mL/min  I-STAT TROPOININ, ED      Result Value Ref Range   Troponin i, poc 0.03  0.00 - 0.08 ng/mL   Comment 3             EKG Interpretation   Date/Time:  Friday April 11 2013 01:13:36 EDT Ventricular Rate:  110 PR Interval:  123 QRS Duration: 152 QT Interval:  371 QTC Calculation: 502 R Axis:   60 Text Interpretation:  Sinus tachycardia Ventricular premature complex Left  bundle branch block When compared with ECG of  01/18/2013, No significant  change was found Confirmed by [facility]  MD, Nehemiah Montee ([id number]) on 04/11/2013  1:25:07 AM      MDM   Final diagnoses:  Chest pain  Hypokalemia    Chest pain which is worrisome for cardiac cause. ECG shows left bundle branch block and not a standing so code STEMI was canceled old records are reviewed and she had placement of a stent in the proximal circumflex artery in January of this year and there was no other significant coronary artery disease at that time. She is given morphine for pain and is also given a nebulizer treatment with albuterol and ipratropium.  Following above noted treatment, she states the pain has subsided but is still present but she is resting much more comfortably. She'll be given another dose of morphine. Potassium is come back low and she's given both oral and intravenous potassium. Case is discussed with Dr. Adolm Joseph of cardiology who agrees to admit the patient.    Dione Booze, MD 04/11/13 [phone]

## 2013-04-11 NOTE — Progress Notes (Addendum)
  Patient: Lindsay Camacho / Admit Date: 04/11/2013 / Date of Encounter: 04/11/2013, 9:28 AM  Subjective  C/o 4/10 chest pain, 'squeezing' in nature, left chest and axilla. No shortness of breath. No other complaints. Pain has been constant since last night.   Objective   Telemetry: NSR  Physical Exam: Blood pressure 106/73, pulse 92, temperature 97.3 F (36.3 C), temperature source Oral, resp. rate 16, height 5\' 2"  (1.575 m), weight 122 lb 6.4 oz (55.52 kg), SpO2 94.00%. General: Well developed, well nourished, in no acute distress. Head: Normocephalic, atraumatic Neck: Negative for carotid bruits. JVP not elevated. Lungs: Clear bilaterally to auscultation without wheezes, rales, or rhonchi. Breathing is unlabored. Heart: RRR S1 S2 without murmurs, rubs, or gallops.  Chest: marked tenderness to palpation over the chest wall Abdomen: Soft, non-tender, non-distended with normoactive bowel sounds. No rebound/guarding. Extremities: No clubbing or cyanosis. No edema. Distal pedal pulses are 2+ and equal bilaterally. Neuro: Alert and oriented X 3. Moves all extremities spontaneously. Psych:  Responds to questions appropriately   No intake or output data in the 24 hours ending 04/11/13 0928  Inpatient Medications:  . aspirin  324 mg Oral Once  . aspirin  81 mg Oral Daily  . clopidogrel  75 mg Oral Q breakfast  . heparin  5,000 Units Subcutaneous 3 times per day  . nicotine  14 mg Transdermal Daily  . sodium chloride  3 mL Intravenous Q12H   Infusions:    Labs:  Recent Labs  04/11/13 0143  NA 141  K 3.1*  CL 101  CO2 21  GLUCOSE 101*  BUN 8  CREATININE 0.65  CALCIUM 9.5   No results found for this basename: AST, ALT, ALKPHOS, BILITOT, PROT, ALBUMIN,  in the last 72 hours  Recent Labs  04/11/13 0143  WBC 8.3  NEUTROABS 5.2  HGB 14.6  HCT 42.4  MCV 87.6  PLT 211    Radiology/Studies:  No results found.   Assessment and Plan  1. Chest pain, atypical  2. CAD s/p  recent NSTEMI 01/2013 s/p DES to LCx 3. Tobacco abuse  4. Hyperlipidemia, intolerant of Lipitor, recently changed to Crestor 5. LBBB 6. Ischemic cardiomyopathy EF 40-45% by echo 01/18/13, euvolemic on exam 7. Hypokalemia, repleted but likely needs maintenance KCl based on prior values 8. Psychosocial stressors  Repeat troponin and BMET/CBC pending this AM.   Signed, Ronie Spies PA-C  Patient seen, examined. Available data reviewed. Agree with findings, assessment, and plan as outlined by Ronie Spies, PA-C. Note edited based on my physical exam findings. Pt with what seems like musculoskeletal chest pain based on characteristics of pain and exam findings. However, she has LBBB and known CAD with PCI in January. She has been having chronic chest pain on and off ever since January. I think it is important to evaluate her for residual ischemic heart disease. Will check a Lexiscan Myoview since she has LBBB. If low-risk, plan discharge home this afternoon to follow-up with Dr Eldridge Dace as an outpatient. Note patient is statin-intolerant.  Consider short course of NSAID for costochondritis 1-2 weeks Motrin 400 mg Q 6 hrs as needed.  Tonny Bollman, M.D. 04/11/2013 9:48 AM

## 2013-04-11 NOTE — Progress Notes (Signed)
UR completed 

## 2013-04-12 ENCOUNTER — Other Ambulatory Visit: Payer: Self-pay

## 2013-04-12 DIAGNOSIS — I10 Essential (primary) hypertension: Secondary | ICD-10-CM

## 2013-04-12 DIAGNOSIS — R0789 Other chest pain: Secondary | ICD-10-CM

## 2013-04-12 DIAGNOSIS — I249 Acute ischemic heart disease, unspecified: Secondary | ICD-10-CM | POA: Diagnosis present

## 2013-04-12 DIAGNOSIS — I2589 Other forms of chronic ischemic heart disease: Secondary | ICD-10-CM

## 2013-04-12 LAB — CBC
HCT: 39.2 % (ref 36.0–46.0)
HEMOGLOBIN: 12.8 g/dL (ref 12.0–15.0)
MCH: 29.8 pg (ref 26.0–34.0)
MCHC: 32.7 g/dL (ref 30.0–36.0)
MCV: 91.2 fL (ref 78.0–100.0)
Platelets: 162 10*3/uL (ref 150–400)
RBC: 4.3 MIL/uL (ref 3.87–5.11)
RDW: 14 % (ref 11.5–15.5)
WBC: 6.6 10*3/uL (ref 4.0–10.5)

## 2013-04-12 LAB — TROPONIN I: Troponin I: <0.3 ng/mL (ref <0.30)

## 2013-04-12 LAB — HEPARIN LEVEL (UNFRACTIONATED): Heparin Unfractionated: 0.47 IU/mL (ref 0.30–0.70)

## 2013-04-12 LAB — PLATELET INHIBITION P2Y12: Platelet Function  P2Y12: 167 [PRU] — ABNORMAL LOW (ref 194–418)

## 2013-04-12 MED ORDER — ATORVASTATIN CALCIUM 40 MG PO TABS
40.0000 mg | ORAL_TABLET | Freq: Every day | ORAL | Status: DC
  Administered 2013-04-12 – 2013-04-14 (×3): 40 mg via ORAL
  Filled 2013-04-12 (×4): qty 1

## 2013-04-12 MED ORDER — CARVEDILOL 3.125 MG PO TABS
3.1250 mg | ORAL_TABLET | Freq: Two times a day (BID) | ORAL | Status: DC
  Administered 2013-04-12 – 2013-04-16 (×7): 3.125 mg via ORAL
  Filled 2013-04-12 (×12): qty 1

## 2013-04-12 NOTE — Progress Notes (Signed)
Utilization Review Completed.  

## 2013-04-12 NOTE — Progress Notes (Signed)
ANTICOAGULATION CONSULT NOTE - Follow Up Consult  Pharmacy Consult for heparin Indication: chest pain/ACS  Allergies  Allergen Reactions  . Brilinta [Ticagrelor]     Dyspnea  . Other Other (See Comments)    BP med ?amaldemac. BP bottomed out  . Penicillins Rash    Patient Measurements: Height: 5\' 2"  (157.5 cm) Weight: 122 lb 6.4 oz (55.52 kg) IBW/kg (Calculated) : 50.1 Heparin Dosing Weight: 55 kg  Vital Signs: Temp: 97.7 F (36.5 C) (04/11 0641) Temp src: Oral (04/11 0641) BP: 98/66 mmHg (04/11 0641) Pulse Rate: 74 (04/11 0641)  Labs:  Recent Labs  04/11/13 0143 04/11/13 0910  04/11/13 1435 04/11/13 2113 04/12/13 0229 04/12/13 0552  HGB 14.6 12.6  --   --   --   --  12.8  HCT 42.4 38.3  --   --   --   --  39.2  PLT 211 157  --   --   --   --  162  APTT 21*  --   --   --   --   --   --   LABPROT 12.8  --   --   --   --   --   --   INR 0.98  --   --   --   --   --   --   HEPARINUNFRC  --   --   --   --   --   --  0.47  CREATININE 0.65 0.63  --   --   --   --   --   TROPONINI  --   --   < > <0.30 <0.30 <0.30  --   < > = values in this interval not displayed.  Estimated Creatinine Clearance: 68.8 ml/min (by C-G formula based on Cr of 0.63).  Assessment: Patient is a 48 y.o F s/p Lexiscan study with noted ischemia.  Plan for cardiac cath on Monday 4/13.  Heparin level is at goal with 0.47 this morning.  No bleeding documented.   Goal of Therapy:  Heparin level 0.3-0.7 units/ml Monitor platelets by anticoagulation protocol: Yes   Plan:  1) continue heparin drip at 900 units/hr  Yuko [employer] P Efraim Vanallen 04/12/2013,7:38 AM

## 2013-04-12 NOTE — Progress Notes (Addendum)
Patient: Lindsay Camacho / Admit Date: 04/11/2013 / Date of Encounter: 04/12/2013, 12:25 PM  Subjective  C/o 4/10 chest pain, was 7/10 but decreased with NTG and morhine. Pain has been constant since admission.   Objective   Telemetry: NSR  Physical Exam: Blood pressure 98/66, pulse 74, temperature 97.7 F (36.5 C), temperature source Oral, resp. rate 18, height 5\' 2"  (1.575 m), weight 122 lb 6.4 oz (55.52 kg), SpO2 95.00%. General: Well developed, well nourished, in no acute distress. Head: Normocephalic, atraumatic Neck: Negative for carotid bruits. JVP not elevated. Lungs: Clear bilaterally to auscultation without wheezes, rales, or rhonchi. Breathing is unlabored. Heart: RRR S1 S2 without murmurs, rubs, or gallops.  Chest: marked tenderness to palpation over the chest wall Abdomen: Soft, non-tender, non-distended with normoactive bowel sounds. No rebound/guarding. Extremities: No clubbing or cyanosis. No edema. Distal pedal pulses are 2+ and equal bilaterally. Neuro: Alert and oriented X 3. Moves all extremities spontaneously. Psych:  Responds to questions appropriately   No intake or output data in the 24 hours ending 04/12/13 1225  Inpatient Medications:  . aspirin  324 mg Oral Once  . aspirin  81 mg Oral Daily  . clopidogrel  75 mg Oral Q breakfast  . nicotine  14 mg Transdermal Daily  . nitroGLYCERIN  1 inch Topical 4 times per day  . sodium chloride  3 mL Intravenous Q12H   Infusions:  . heparin 900 Units/hr (04/11/13 1938)   Labs:  Recent Labs  04/11/13 0143 04/11/13 0910  NA 141 144  K 3.1* 4.3  CL 101 111  CO2 21 23  GLUCOSE 101* 82  BUN 8 8  CREATININE 0.65 0.63  CALCIUM 9.5 8.7   Recent Labs  04/11/13 0143 04/11/13 0910 04/12/13 0552  WBC 8.3 6.5 6.6  NEUTROABS 5.2  --   --   HGB 14.6 12.6 12.8  HCT 42.4 38.3 39.2  MCV 87.6 89.5 91.2  PLT 211 157 162   ECHO: 01/18/2013 Left ventricle: The cavity size was mildly dilated. Wall thickness was  normal. Systolic function was mildly to moderately reduced. The estimated ejection fraction was in the range of 40% to 45%. Diffuse hypokinesis. Severe hypokinesis of the inferolateral myocardium. - Aortic valve: Mild regurgitation. - Mitral valve: Mild to moderate regurgitation.  Nuclear stress test: 04/11/2013 IMPRESSION:  This is interpreted as an high risk Myoview study. There is evidence  of a previous anterior apical infarct with evidence of very infarct  ischemia. There is also evidence of inferior lateral basal ischemia.  The left ventricular systolic function is moderately reduced with a  left ventricular EF of 36%.  Electronically Signed  By: Kristeen Miss  On: 04/11/2013 16:16  Tele: SR to ST with frequent PVCs     Assessment and Plan  1. Chest pain - UA 2. CAD s/p recent NSTEMI 01/2013 s/p DES to LCx 3. Tobacco abuse  4. Hyperlipidemia, intolerant of Lipitor, recently changed to Crestor 5. LBBB 6. Ischemic cardiomyopathy EF 40-45% by echo 01/18/13, euvolemic on exam 7. Hypokalemia, repleted but likely needs maintenance KCl based on prior values 8. Psychosocial stressors  48 year old female with known CAD, recent PCI/DES to LCX on 01/21/2013/ She was admitted with chest pain and has significantly abnormal stress test with inferolateral ischemia, negative troponins and unchanged old LBBB on ECG.  The patient has ongoing chest pain, we will discuss with interventional about possible cath this weekend.  Continue Heparin drip, aspirin, plavix, she didn't tolerate ACEI due  to hypotension. We will start a very low dose BB- carvedilol 3.125 mg po bid and atorvastatin 40 mg po QHS.  Lars MassonKatarina H Merita Camacho   04/12/2013 12:25 PM

## 2013-04-12 NOTE — Progress Notes (Signed)
Pt was resting in bed and complained of CP 5/10. EKG obtained. 1 sl nitro given that did not relieve pain. Md on call made aware. Will cont to monitor pt.

## 2013-04-13 DIAGNOSIS — I2510 Atherosclerotic heart disease of native coronary artery without angina pectoris: Secondary | ICD-10-CM

## 2013-04-13 DIAGNOSIS — I359 Nonrheumatic aortic valve disorder, unspecified: Secondary | ICD-10-CM

## 2013-04-13 LAB — HEPARIN LEVEL (UNFRACTIONATED): Heparin Unfractionated: 0.49 IU/mL (ref 0.30–0.70)

## 2013-04-13 LAB — CBC
HCT: 38 % (ref 36.0–46.0)
HEMOGLOBIN: 12.7 g/dL (ref 12.0–15.0)
MCH: 29.7 pg (ref 26.0–34.0)
MCHC: 33.4 g/dL (ref 30.0–36.0)
MCV: 89 fL (ref 78.0–100.0)
Platelets: 160 10*3/uL (ref 150–400)
RBC: 4.27 MIL/uL (ref 3.87–5.11)
RDW: 13.4 % (ref 11.5–15.5)
WBC: 6.8 10*3/uL (ref 4.0–10.5)

## 2013-04-13 LAB — TROPONIN I: Troponin I: <0.3 ng/mL (ref <0.30)

## 2013-04-13 MED ORDER — SODIUM CHLORIDE 0.9 % IV SOLN: 250.0000 mL | INTRAVENOUS | Status: DC | PRN

## 2013-04-13 MED ORDER — SODIUM CHLORIDE 0.9 % IJ SOLN: 3.0000 mL | INTRAMUSCULAR | Status: DC | PRN

## 2013-04-13 MED ORDER — SODIUM CHLORIDE 0.9 % IJ SOLN: 3.0000 mL | Freq: Two times a day (BID) | INTRAMUSCULAR | Status: DC

## 2013-04-13 MED ORDER — DIAZEPAM 5 MG PO TABS
5.0000 mg | ORAL_TABLET | ORAL | Status: AC
  Administered 2013-04-14: 5 mg via ORAL
  Filled 2013-04-13: qty 1

## 2013-04-13 MED ORDER — ASPIRIN 81 MG PO CHEW
81.0000 mg | CHEWABLE_TABLET | ORAL | Status: AC
  Administered 2013-04-14: 81 mg via ORAL
  Filled 2013-04-13: qty 1

## 2013-04-13 MED ORDER — ONDANSETRON HCL 4 MG/2ML IJ SOLN
4.0000 mg | Freq: Four times a day (QID) | INTRAMUSCULAR | Status: DC | PRN
  Administered 2013-04-13 – 2013-04-14 (×2): 4 mg via INTRAVENOUS
  Filled 2013-04-13 (×2): qty 2

## 2013-04-13 MED ORDER — SODIUM CHLORIDE 0.9 % IV SOLN: 1.0000 mL/kg/h | INTRAVENOUS | Status: DC

## 2013-04-13 MED ORDER — ONDANSETRON HCL 4 MG/2ML IJ SOLN: 4.0000 mg | Freq: Four times a day (QID) | INTRAMUSCULAR | Status: DC

## 2013-04-13 MED ORDER — ASPIRIN 81 MG PO CHEW: 324.0000 mg | CHEWABLE_TABLET | ORAL | Status: DC

## 2013-04-13 MED ORDER — SODIUM CHLORIDE 0.9 % IV SOLN
INTRAVENOUS | Status: DC
  Administered 2013-04-13: 23:00:00 via INTRAVENOUS

## 2013-04-13 MED ORDER — HYDROCODONE-ACETAMINOPHEN 5-325 MG PO TABS
1.0000 | ORAL_TABLET | ORAL | Status: DC | PRN
  Administered 2013-04-13 – 2013-04-17 (×14): 1 via ORAL
  Filled 2013-04-13 (×14): qty 1

## 2013-04-13 NOTE — Progress Notes (Signed)
Subjective:  Continues to have CP.  Left upper arm feels numb.  + nausea   Objective: Vital signs in last 24 hours: Temp:  [97.8 F (36.6 C)-98.1 F (36.7 C)] 98 F (36.7 C) (04/12 0527) Pulse Rate:  [55-81] 55 (04/12 0527) Resp:  [16-18] 16 (04/12 0527) BP: (89-104)/(58-70) 97/61 mmHg (04/12 0527) SpO2:  [95 %-96 %] 95 % (04/12 0527) Last BM Date: 04/07/13  Intake/Output from previous day: 04/11 0701 - 04/12 0700 In: 72 [I.V.:72] Out: -  Intake/Output this shift:    Medications Current Facility-Administered Medications  Medication Dose Route Frequency Provider Last Rate Last Dose  . 0.9 %  sodium chloride infusion  250 mL Intravenous PRN Ardis RowanMatthew Whitlock, MD      . acetaminophen (TYLENOL) tablet 975 mg  975 mg Oral TID PRN Joline Salthonda G Barrett, PA-C   975 mg at 04/11/13 1258  . aspirin chewable tablet 324 mg  324 mg Oral Once Hurman HornJohn M Bednar, MD      . aspirin chewable tablet 81 mg  81 mg Oral Daily Ardis RowanMatthew Whitlock, MD   81 mg at 04/13/13 0809  . atorvastatin (LIPITOR) tablet 40 mg  40 mg Oral q1800 Lars MassonKatarina H Kessler Kopinski, MD   40 mg at 04/12/13 1734  . carvedilol (COREG) tablet 3.125 mg  3.125 mg Oral BID WC Lars MassonKatarina H Charnele Semple, MD   3.125 mg at 04/13/13 0800  . clopidogrel (PLAVIX) tablet 75 mg  75 mg Oral Q breakfast Ardis RowanMatthew Whitlock, MD   75 mg at 04/13/13 0809  . heparin ADULT infusion 100 units/mL (25000 units/250 mL)  900 Units/hr Intravenous Continuous Chinita Greenlandita Faye Johnston, [facility] HospitalRPH 9 mL/hr at 04/12/13 2038 900 Units/hr at 04/12/13 2038  . morphine 2 MG/ML injection 2-4 mg  2-4 mg Intravenous Q1H PRN Joline Salthonda G Barrett, PA-C   4 mg at 04/13/13 0530  . nicotine (NICODERM CQ - dosed in mg/24 hours) patch 14 mg  14 mg Transdermal Daily Ardis RowanMatthew Whitlock, MD   14 mg at 04/13/13 0555  . nitroGLYCERIN (NITROGLYN) 2 % ointment 1 inch  1 inch Topical 4 times per day Darrol Jumphonda G Barrett, PA-C   1 inch at 04/13/13 0530  . nitroGLYCERIN (NITROSTAT) SL tablet 0.4 mg  0.4 mg Sublingual Q5 Min x 3 PRN  Ardis RowanMatthew Whitlock, MD   0.4 mg at 04/11/13 1924  . sodium chloride 0.9 % injection 3 mL  3 mL Intravenous Q12H Ardis RowanMatthew Whitlock, MD   3 mL at 04/11/13 1055  . sodium chloride 0.9 % injection 3 mL  3 mL Intravenous PRN Ardis RowanMatthew Whitlock, MD   3 mL at 04/11/13 1055  . traMADol (ULTRAM) tablet 50-100 mg  50-100 mg Oral Q6H PRN Joline SaltRhonda G Barrett, PA-C   100 mg at 04/13/13 [id number]    PE: General appearance: alert, cooperative and no distress Lungs: clear to auscultation bilaterally Heart: regular rate and rhythm and 1/6 sys MM Abdomen: +BS, nontender. Extremities: No LEE Pulses: 2+ and symmetric Skin: Warm and dry Neurologic: Grossly normal  Lab Results:   Recent Labs  04/11/13 0910 04/12/13 0552 04/13/13 0620  WBC 6.5 6.6 6.8  HGB 12.6 12.8 12.7  HCT 38.3 39.2 38.0  PLT 157 162 160   BMET  Recent Labs  04/11/13 0143 04/11/13 0910  NA 141 144  K 3.1* 4.3  CL 101 111  CO2 21 23  GLUCOSE 101* 82  BUN 8 8  CREATININE 0.65 0.63  CALCIUM 9.5 8.7   PT/INR  Recent Labs  04/11/13 0143  LABPROT 12.8  INR 0.98   Cardiac Panel (last 3 results)  Recent Labs  04/11/13 2113 04/12/13 0229 04/12/13 1955  TROPONINI <0.30 <0.30 <0.30     Assessment/Plan 48 year old female with known CAD, recent PCI/DES to LCX on 01/21/2013/ She was admitted with chest pain and has significantly abnormal stress test with inferolateral ischemia, negative troponins and unchanged old LBBB on ECG.  EF from 01/18/13-10-45% with diffuse hypokinesis.  Active Problems:   Chest pain   ACS (acute coronary syndrome)   Nausea  Plan:   Ruled out for MI.  High risk NST. On IV heparin, asa, plavix, carvedilol 3.125 mg po bid, NTG ointment and atorvastatin 40 mg po QHS.  Zofran for nausea.  But limits titration of meds.  WBCs WNL.  Left heart cath tomorrow,.  Repeat echo.  Zofran for nausea.  Pre-cath orders completed.    LOS: 2 days    Wilburt Finlay PA-C 04/13/2013 8:49 AM  The patient was seen,  examined and discussed with Wilburt Finlay, PA-C and I agree with the above.   48 year old female with known CAD, recent PCI/DES to LCX on 01/21/2013/ She was admitted with chest pain and has significantly abnormal stress test with inferolateral ischemia, negative troponins and unchanged old LBBB on ECG.  However, the patient has ongoing chest pain, that is getting worse and is now nauseous. We will discuss potential cath today with Dr Katrinka Blazing. Continue Heparin drip, aspirin, plavix, she didn't tolerate ACEI due to hypotension. We will start a very low dose BB- carvedilol 3.125 mg po bid and atorvastatin 40 mg po QHS.  Lars Masson 04/13/2013

## 2013-04-13 NOTE — Progress Notes (Signed)
  Echocardiogram 2D Echocardiogram has been performed.  Lindsay Camacho 04/13/2013, 4:31 PM

## 2013-04-13 NOTE — Progress Notes (Signed)
ANTICOAGULATION CONSULT NOTE - Follow Up Consult  Pharmacy Consult for heparin Indication: chest pain/ACS  Allergies  Allergen Reactions  . Brilinta [Ticagrelor]     Dyspnea  . Other Other (See Comments)    BP med ?amaldemac. BP bottomed out  . Penicillins Rash    Patient Measurements: Height: 5\' 2"  (157.5 cm) Weight: 122 lb 6.4 oz (55.52 kg) IBW/kg (Calculated) : 50.1 Heparin Dosing Weight: 55 kg  Vital Signs: Temp: 98 F (36.7 C) (04/12 0527) Temp src: Oral (04/12 0527) BP: 97/61 mmHg (04/12 0527) Pulse Rate: 55 (04/12 0527)  Labs:  Recent Labs  04/11/13 0143 04/11/13 0910  04/11/13 2113 04/12/13 0229 04/12/13 0552 04/12/13 1955 04/13/13 0620  HGB 14.6 12.6  --   --   --  12.8  --  12.7  HCT 42.4 38.3  --   --   --  39.2  --  38.0  PLT 211 157  --   --   --  162  --  160  APTT 21*  --   --   --   --   --   --   --   LABPROT 12.8  --   --   --   --   --   --   --   INR 0.98  --   --   --   --   --   --   --   HEPARINUNFRC  --   --   --   --   --  0.47  --  0.49  CREATININE 0.65 0.63  --   --   --   --   --   --   TROPONINI  --   --   < > <0.30 <0.30  --  <0.30  --   < > = values in this interval not displayed.  Estimated Creatinine Clearance: 68.8 ml/min (by C-G formula based on Cr of 0.63).   Assessment: Patient is a 48 y.o F on heparin for CP/ACS with plan for cath.  Heparin level is therapeutic at 0.49.  No bleeding documented.  Goal of Therapy:  Heparin level 0.3-0.7 units/ml Monitor platelets by anticoagulation protocol: Yes   Plan:  1) continue heparin drip at 900 units/hr   Cassidi Modesitt P Nimai Burbach 04/13/2013,7:26 AM

## 2013-04-13 NOTE — Progress Notes (Signed)
Pt with worsening chest pain stating it "feels like electrical shocks in my chest" and left arm tingling. Pt denies SOB but is throwing up. Pt VSS. 2L o2 via nasal cannula. Wilburt Finlay verbally notified. Pt kept NPO for cath lab today. Levonne Spiller, RN

## 2013-04-13 NOTE — Consult Note (Addendum)
   INTERVENTIONAL CARDIOLOGY CONSULT  Subjective: The patient is 47 and has history of CAD with at least 2 prior coronary angiograms. Had mild to moderate Cfx diease at [facility] 2014 and presented to [facility] in January 2015 with subtotaled Cfx and NSTEMI, requiring Cfx stent.  Has been having increasing chest pain at work since February, and since admission has had continuous pain but with negative markers. She has left bundle on ECG. In addition to a sharp electric like pain she also describes a tight feeling. She has developed nausea and vomiting this AM. She is currently resting comfortably but when questioned, asks for pain medications. She denies dyspnea and chest pain currently.   Reviewed angiograms from January 2015 where she received DES to circumflex for basically single vessel disease with very nice result. The LAD and RCA had liminal irregularity. Objective: BP 104/68, HR 70  Skin is warm and dry Chest is clear Cardiac exam is without gallop or rub, but 1/6 systolic murmur is noted. Abdomen is soft. Extremities reveal decreased right radial pulse. Otherwise pulses are 2+ and symmetric Neuro reveals an anxious lady who appears older than her stated age. No focal deficits  ECG with new lateral Q waves in setting of LBBB  Assessment: Atypical symptoms in this 47 year old with h/o Cfx stent and now new Q waves on ECG in 1 And AVL  Plan:  Troponin I. If elevated troponin will cath today, otherwise elective study tomorrow.  

## 2013-04-14 ENCOUNTER — Encounter (HOSPITAL_COMMUNITY): Payer: BC Managed Care – PPO | Admitting: Certified Registered"

## 2013-04-14 ENCOUNTER — Inpatient Hospital Stay (HOSPITAL_COMMUNITY): Payer: BC Managed Care – PPO

## 2013-04-14 ENCOUNTER — Inpatient Hospital Stay (HOSPITAL_COMMUNITY): Payer: BC Managed Care – PPO | Admitting: Certified Registered"

## 2013-04-14 ENCOUNTER — Other Ambulatory Visit: Payer: Self-pay

## 2013-04-14 ENCOUNTER — Encounter (HOSPITAL_COMMUNITY)
Admission: EM | Disposition: A | Discharge status: Home / Self Care | Payer: BC Managed Care – PPO | Source: Home / Self Care | Attending: Cardiology

## 2013-04-14 ENCOUNTER — Encounter (HOSPITAL_COMMUNITY): Payer: Self-pay | Admitting: Interventional Cardiology

## 2013-04-14 ENCOUNTER — Encounter (HOSPITAL_COMMUNITY)
Admission: EM | Disposition: A | Discharge status: Home / Self Care | Payer: Self-pay | Source: Home / Self Care | Attending: Cardiology

## 2013-04-14 ENCOUNTER — Telehealth: Payer: Self-pay | Admitting: Vascular Surgery

## 2013-04-14 DIAGNOSIS — I200 Unstable angina: Secondary | ICD-10-CM | POA: Diagnosis present

## 2013-04-14 DIAGNOSIS — IMO0002 Reserved for concepts with insufficient information to code with codable children: Secondary | ICD-10-CM

## 2013-04-14 HISTORY — PX: LEFT HEART CATHETERIZATION WITH CORONARY ANGIOGRAM: SHX5451

## 2013-04-14 HISTORY — PX: GROIN DEBRIDEMENT: SHX5159

## 2013-04-14 HISTORY — PX: CARDIAC CATHETERIZATION: SHX172

## 2013-04-14 LAB — CBC
HCT: 38.1 % (ref 36.0–46.0)
HCT: 32.9 % — ABNORMAL LOW (ref 36.0–46.0)
HEMOGLOBIN: 12.7 g/dL (ref 12.0–15.0)
HEMOGLOBIN: 11.2 g/dL — AB (ref 12.0–15.0)
MCH: 29.3 pg (ref 26.0–34.0)
MCH: 30.1 pg (ref 26.0–34.0)
MCHC: 33.3 g/dL (ref 30.0–36.0)
MCHC: 34 g/dL (ref 30.0–36.0)
MCV: 88 fL (ref 78.0–100.0)
MCV: 88.4 fL (ref 78.0–100.0)
Platelets: 155 10*3/uL (ref 150–400)
Platelets: 147 10*3/uL — ABNORMAL LOW (ref 150–400)
RBC: 4.33 MIL/uL (ref 3.87–5.11)
RBC: 3.72 MIL/uL — ABNORMAL LOW (ref 3.87–5.11)
RDW: 13.1 % (ref 11.5–15.5)
RDW: 13.2 % (ref 11.5–15.5)
WBC: 5.6 10*3/uL (ref 4.0–10.5)
WBC: 6.5 10*3/uL (ref 4.0–10.5)

## 2013-04-14 LAB — POCT I-STAT 7, (LYTES, BLD GAS, ICA,H+H)
Acid-base deficit: 2 mmol/L (ref 0.0–2.0)
Bicarbonate: 22.6 mEq/L (ref 20.0–24.0)
Calcium, Ion: 1.11 mmol/L — ABNORMAL LOW (ref 1.12–1.23)
HCT: 24 % — ABNORMAL LOW (ref 36.0–46.0)
HEMOGLOBIN: 8.2 g/dL — AB (ref 12.0–15.0)
O2 SAT: 100 %
PCO2 ART: 34.2 mmHg — AB (ref 35.0–45.0)
PH ART: 7.423 (ref 7.350–7.450)
PO2 ART: 448 mmHg — AB (ref 80.0–100.0)
Patient temperature: 35.6
Potassium: 3.8 mEq/L (ref 3.7–5.3)
Sodium: 138 mEq/L (ref 137–147)
TCO2: 24 mmol/L (ref 0–100)

## 2013-04-14 LAB — PREPARE RBC (CROSSMATCH)

## 2013-04-14 LAB — HEMOGLOBIN AND HEMATOCRIT, BLOOD
HCT: 33.5 % — ABNORMAL LOW (ref 36.0–46.0)
HEMOGLOBIN: 11.4 g/dL — AB (ref 12.0–15.0)

## 2013-04-14 LAB — HEPARIN LEVEL (UNFRACTIONATED): Heparin Unfractionated: 0.5 IU/mL (ref 0.30–0.70)

## 2013-04-14 LAB — POCT ACTIVATED CLOTTING TIME: Activated Clotting Time: 144 seconds

## 2013-04-14 LAB — MRSA PCR SCREENING: MRSA by PCR: NEGATIVE

## 2013-04-14 LAB — ABO/RH: ABO/RH(D): A POS

## 2013-04-14 SURGERY — LEFT HEART CATHETERIZATION WITH CORONARY ANGIOGRAM
Anesthesia: LOCAL

## 2013-04-14 SURGERY — DEBRIDEMENT, INGUINAL REGION
Anesthesia: General | Site: Abdomen | Laterality: Right

## 2013-04-14 MED ORDER — MIDAZOLAM HCL 2 MG/2ML IJ SOLN
INTRAMUSCULAR | Status: AC
  Filled 2013-04-14: qty 2

## 2013-04-14 MED ORDER — ALUM & MAG HYDROXIDE-SIMETH 200-200-20 MG/5ML PO SUSP: 15.0000 mL | ORAL | Status: DC | PRN

## 2013-04-14 MED ORDER — POTASSIUM CHLORIDE CRYS ER 20 MEQ PO TBCR: 20.0000 meq | EXTENDED_RELEASE_TABLET | Freq: Every day | ORAL | Status: DC | PRN

## 2013-04-14 MED ORDER — LABETALOL HCL 5 MG/ML IV SOLN: 10.0000 mg | INTRAVENOUS | Status: DC | PRN

## 2013-04-14 MED ORDER — METOCLOPRAMIDE HCL 5 MG/ML IJ SOLN
INTRAMUSCULAR | Status: AC
Start: 2013-04-14 — End: 2013-04-15
  Filled 2013-04-14: qty 2

## 2013-04-14 MED ORDER — 0.9 % SODIUM CHLORIDE (POUR BTL) OPTIME
TOPICAL | Status: DC | PRN
  Administered 2013-04-14 (×3): 1000 mL

## 2013-04-14 MED ORDER — SODIUM CHLORIDE 0.9 % IV SOLN: 1.0000 mL/kg/h | INTRAVENOUS | Status: AC

## 2013-04-14 MED ORDER — ETOMIDATE 2 MG/ML IV SOLN
INTRAVENOUS | Status: DC | PRN
  Administered 2013-04-14: 10 mg via INTRAVENOUS

## 2013-04-14 MED ORDER — GLYCOPYRROLATE 0.2 MG/ML IJ SOLN
INTRAMUSCULAR | Status: DC | PRN
  Administered 2013-04-14: .1 mg via INTRAVENOUS
  Administered 2013-04-14: .4 mg via INTRAVENOUS

## 2013-04-14 MED ORDER — PANTOPRAZOLE SODIUM 40 MG PO TBEC
40.0000 mg | DELAYED_RELEASE_TABLET | Freq: Every day | ORAL | Status: DC
  Administered 2013-04-14 – 2013-04-18 (×5): 40 mg via ORAL
  Filled 2013-04-14 (×5): qty 1

## 2013-04-14 MED ORDER — SODIUM CHLORIDE 0.9 % IV SOLN
1000.0000 mg | INTRAVENOUS | Status: DC | PRN
  Administered 2013-04-14: 1000 mg via INTRAVENOUS

## 2013-04-14 MED ORDER — FENTANYL CITRATE 0.05 MG/ML IJ SOLN
INTRAMUSCULAR | Status: AC
  Filled 2013-04-14: qty 5

## 2013-04-14 MED ORDER — DOCUSATE SODIUM 100 MG PO CAPS
100.0000 mg | ORAL_CAPSULE | Freq: Every day | ORAL | Status: DC
  Administered 2013-04-15 – 2013-04-16 (×2): 100 mg via ORAL
  Filled 2013-04-14 (×3): qty 1

## 2013-04-14 MED ORDER — MIDAZOLAM HCL 5 MG/5ML IJ SOLN
INTRAMUSCULAR | Status: DC | PRN
  Administered 2013-04-14: 2 mg via INTRAVENOUS

## 2013-04-14 MED ORDER — METOPROLOL TARTRATE 1 MG/ML IV SOLN: 2.0000 mg | INTRAVENOUS | Status: DC | PRN

## 2013-04-14 MED ORDER — MAGNESIUM SULFATE 40 MG/ML IJ SOLN
2.0000 g | Freq: Every day | INTRAMUSCULAR | Status: DC | PRN
  Filled 2013-04-14: qty 50

## 2013-04-14 MED ORDER — METOCLOPRAMIDE HCL 5 MG/ML IJ SOLN
10.0000 mg | Freq: Once | INTRAMUSCULAR | Status: AC
  Administered 2013-04-14: 10 mg via INTRAVENOUS

## 2013-04-14 MED ORDER — NEOSTIGMINE METHYLSULFATE 1 MG/ML IJ SOLN
INTRAMUSCULAR | Status: DC | PRN
  Administered 2013-04-14: 3 mg via INTRAVENOUS
  Administered 2013-04-14: 1 mg via INTRAVENOUS

## 2013-04-14 MED ORDER — ROCURONIUM BROMIDE 100 MG/10ML IV SOLN
INTRAVENOUS | Status: DC | PRN
  Administered 2013-04-14: 30 mg via INTRAVENOUS
  Administered 2013-04-14: 10 mg via INTRAVENOUS

## 2013-04-14 MED ORDER — HYDRALAZINE HCL 20 MG/ML IJ SOLN: 10.0000 mg | INTRAMUSCULAR | Status: DC | PRN

## 2013-04-14 MED ORDER — LACTATED RINGERS IV SOLN
INTRAVENOUS | Status: DC | PRN
  Administered 2013-04-14: 12:00:00 via INTRAVENOUS

## 2013-04-14 MED ORDER — SODIUM CHLORIDE 0.9 % IV SOLN: 500.0000 mL | Freq: Once | INTRAVENOUS | Status: AC | PRN

## 2013-04-14 MED ORDER — FENTANYL CITRATE 0.05 MG/ML IJ SOLN
INTRAMUSCULAR | Status: AC
  Filled 2013-04-14: qty 2

## 2013-04-14 MED ORDER — DOPAMINE-DEXTROSE 3.2-5 MG/ML-% IV SOLN: 3.0000 ug/kg/min | INTRAVENOUS | Status: DC

## 2013-04-14 MED ORDER — SODIUM CHLORIDE 0.9 % IR SOLN
Status: DC | PRN
  Administered 2013-04-14: 13:00:00

## 2013-04-14 MED ORDER — ARTIFICIAL TEARS OP OINT
TOPICAL_OINTMENT | OPHTHALMIC | Status: DC | PRN
  Administered 2013-04-14: 1 via OPHTHALMIC

## 2013-04-14 MED ORDER — LIDOCAINE HCL (PF) 1 % IJ SOLN
INTRAMUSCULAR | Status: AC
  Filled 2013-04-14: qty 30

## 2013-04-14 MED ORDER — VANCOMYCIN HCL IN DEXTROSE 1-5 GM/200ML-% IV SOLN
1000.0000 mg | Freq: Two times a day (BID) | INTRAVENOUS | Status: AC
  Administered 2013-04-14 – 2013-04-15 (×2): 1000 mg via INTRAVENOUS
  Filled 2013-04-14 (×2): qty 200

## 2013-04-14 MED ORDER — PHENYLEPHRINE HCL 10 MG/ML IJ SOLN
10.0000 mg | INTRAVENOUS | Status: DC | PRN
  Administered 2013-04-14: 30 ug/min via INTRAVENOUS

## 2013-04-14 MED ORDER — THROMBIN 20000 UNITS EX SOLR
CUTANEOUS | Status: AC
  Filled 2013-04-14: qty 20000

## 2013-04-14 MED ORDER — VANCOMYCIN HCL IN DEXTROSE 1-5 GM/200ML-% IV SOLN
INTRAVENOUS | Status: AC
  Filled 2013-04-14: qty 200

## 2013-04-14 MED ORDER — ALBUMIN HUMAN 5 % IV SOLN
INTRAVENOUS | Status: DC | PRN
  Administered 2013-04-14: 13:00:00 via INTRAVENOUS

## 2013-04-14 MED ORDER — HEPARIN (PORCINE) IN NACL 2-0.9 UNIT/ML-% IJ SOLN
INTRAMUSCULAR | Status: AC
  Filled 2013-04-14: qty 1000

## 2013-04-14 MED ORDER — FENTANYL CITRATE 0.05 MG/ML IJ SOLN
25.0000 ug | INTRAMUSCULAR | Status: DC | PRN
Start: 2013-04-14 — End: 2013-04-16
  Administered 2013-04-14: 50 ug via INTRAVENOUS
  Administered 2013-04-14 (×2): 25 ug via INTRAVENOUS
  Administered 2013-04-14: 50 ug via INTRAVENOUS
  Administered 2013-04-14: 25 ug via INTRAVENOUS
  Filled 2013-04-14 (×2): qty 2

## 2013-04-14 MED ORDER — PHENOL 1.4 % MT LIQD: 1.0000 | OROMUCOSAL | Status: DC | PRN

## 2013-04-14 MED ORDER — MORPHINE SULFATE 2 MG/ML IJ SOLN
INTRAMUSCULAR | Status: AC
  Filled 2013-04-14: qty 1

## 2013-04-14 MED ORDER — SUCCINYLCHOLINE CHLORIDE 20 MG/ML IJ SOLN
INTRAMUSCULAR | Status: DC | PRN
  Administered 2013-04-14: 100 mg via INTRAVENOUS

## 2013-04-14 MED ORDER — FENTANYL CITRATE 0.05 MG/ML IJ SOLN
INTRAMUSCULAR | Status: DC | PRN
  Administered 2013-04-14: 100 ug via INTRAVENOUS

## 2013-04-14 MED ORDER — DEXTROSE 5 % IV SOLN: 10.0000 mg | INTRAVENOUS | Status: DC | PRN

## 2013-04-14 MED ORDER — GUAIFENESIN-DM 100-10 MG/5ML PO SYRP
15.0000 mL | ORAL_SOLUTION | ORAL | Status: DC | PRN
  Filled 2013-04-14: qty 15

## 2013-04-14 MED FILL — Verapamil HCl IV Soln 2.5 MG/ML: INTRAVENOUS | Qty: 2 | Status: AC

## 2013-04-14 MED FILL — Lidocaine HCl Local Preservative Free (PF) Inj 1%: INTRAMUSCULAR | Qty: 30 | Status: AC

## 2013-04-14 SURGICAL SUPPLY — 50 items
ADH SKN CLS LQ APL DERMABOND (GAUZE/BANDAGES/DRESSINGS) ×2
BAG ISL DRAPE 18X18 STRL (DRAPES) ×1
BAG ISOLATION DRAPE 18X18 (DRAPES) IMPLANT
BANDAGE GAUZE ELAST BULKY 4 IN (GAUZE/BANDAGES/DRESSINGS) IMPLANT
CANISTER SUCTION 2500CC (MISCELLANEOUS) ×2 IMPLANT
COVER SURGICAL LIGHT HANDLE (MISCELLANEOUS) ×2 IMPLANT
DERMABOND ADHESIVE PROPEN (GAUZE/BANDAGES/DRESSINGS) ×2
DERMABOND ADVANCED .7 DNX6 (GAUZE/BANDAGES/DRESSINGS) IMPLANT
DRAPE INCISE IOBAN 66X45 STRL (DRAPES) ×1 IMPLANT
DRAPE ISOLATION BAG 18X18 (DRAPES) ×1
DRAPE SLUSH/WARMER DISC (DRAPES) ×1 IMPLANT
ELECT REM PT RETURN 9FT ADLT (ELECTROSURGICAL) ×2
ELECTRODE REM PT RTRN 9FT ADLT (ELECTROSURGICAL) ×1 IMPLANT
GLOVE BIO SURGEON STRL SZ 6.5 (GLOVE) ×4 IMPLANT
GLOVE BIO SURGEON STRL SZ7 (GLOVE) ×1 IMPLANT
GLOVE BIO SURGEON STRL SZ7.5 (GLOVE) ×2 IMPLANT
GLOVE BIOGEL PI IND STRL 6.5 (GLOVE) IMPLANT
GLOVE BIOGEL PI IND STRL 7.0 (GLOVE) IMPLANT
GLOVE BIOGEL PI IND STRL 8 (GLOVE) IMPLANT
GLOVE BIOGEL PI INDICATOR 6.5 (GLOVE) ×1
GLOVE BIOGEL PI INDICATOR 7.0 (GLOVE) ×3
GLOVE BIOGEL PI INDICATOR 8 (GLOVE) ×1
GOWN STRL REUS W/ TWL LRG LVL3 (GOWN DISPOSABLE) ×3 IMPLANT
GOWN STRL REUS W/TWL LRG LVL3 (GOWN DISPOSABLE) ×8
KIT BASIN OR (CUSTOM PROCEDURE TRAY) ×2 IMPLANT
KIT ROOM TURNOVER OR (KITS) ×2 IMPLANT
LOOP VESSEL MAXI BLUE (MISCELLANEOUS) ×1 IMPLANT
NS IRRIG 1000ML POUR BTL (IV SOLUTION) ×2 IMPLANT
PACK CV ACCESS (CUSTOM PROCEDURE TRAY) ×1 IMPLANT
PACK GENERAL/GYN (CUSTOM PROCEDURE TRAY) ×2 IMPLANT
PACK UNIVERSAL I (CUSTOM PROCEDURE TRAY) ×3 IMPLANT
PAD ARMBOARD 7.5X6 YLW CONV (MISCELLANEOUS) ×4 IMPLANT
SPONGE GAUZE 4X4 12PLY (GAUZE/BANDAGES/DRESSINGS) ×2 IMPLANT
SPONGE LAP 18X18 X RAY DECT (DISPOSABLE) ×2 IMPLANT
STAPLER VISISTAT 35W (STAPLE) IMPLANT
SUT ETHILON 3 0 PS 1 (SUTURE) ×1 IMPLANT
SUT PROLENE 5 0 CC1 (SUTURE) ×2 IMPLANT
SUT PROLENE 6 0 BV (SUTURE) ×1 IMPLANT
SUT PROLENE 7 0 BV 1 (SUTURE) ×1 IMPLANT
SUT VIC AB 2-0 CT1 27 (SUTURE) ×4
SUT VIC AB 2-0 CT1 TAPERPNT 27 (SUTURE) IMPLANT
SUT VIC AB 2-0 CTX 36 (SUTURE) ×2 IMPLANT
SUT VIC AB 3-0 SH 27 (SUTURE) ×4
SUT VIC AB 3-0 SH 27X BRD (SUTURE) IMPLANT
SUT VICRYL 4-0 PS2 18IN ABS (SUTURE) ×1 IMPLANT
TOWEL OR 17X24 6PK STRL BLUE (TOWEL DISPOSABLE) ×2 IMPLANT
TOWEL OR 17X26 10 PK STRL BLUE (TOWEL DISPOSABLE) ×2 IMPLANT
TRAY FOLEY CATH 16FR SILVER (SET/KITS/TRAYS/PACK) ×1 IMPLANT
TRAY FOLEY CATH 16FRSI W/METER (SET/KITS/TRAYS/PACK) ×1 IMPLANT
WATER STERILE IRR 1000ML POUR (IV SOLUTION) ×2 IMPLANT

## 2013-04-14 NOTE — Transfer of Care (Signed)
Immediate Anesthesia Transfer of Care Note  Patient: Lindsay Camacho  Procedure(s) Performed: Procedure(s): Emergency Evacuation of Retroperitoneal Hematoma and Repair Right External Iliac Artery Pseudoaneurysm     (Right)  Patient Location: PACU  Anesthesia Type:General  Level of Consciousness: awake, alert  and oriented  Airway & Oxygen Therapy: Patient Spontanous Breathing and Patient connected to nasal cannula oxygen  Post-op Assessment: Report given to PACU RN and Post -op Vital signs reviewed and stable  Post vital signs: Reviewed and stable  Complications: No apparent anesthesia complications

## 2013-04-14 NOTE — Interval H&P Note (Signed)
Cath Lab Visit (complete for each Cath Lab visit)  Clinical Evaluation Leading to the Procedure:   ACS: yes  Non-ACS:    Anginal Classification: CCS IV  Anti-ischemic medical therapy: Maximal Therapy (2 or more classes of medications)  Non-Invasive Test Results: No non-invasive testing performed  Prior CABG: No previous CABG      History and Physical Interval Note:  04/14/2013 7:39 AM  Lindsay Camacho  has presented today for surgery, with the diagnosis of CP  The various methods of treatment have been discussed with the patient and family. After consideration of risks, benefits and other options for treatment, the patient has consented to  Procedure(s): LEFT HEART CATHETERIZATION WITH CORONARY ANGIOGRAM (N/A) as a surgical intervention .  The patient's history has been reviewed, patient examined, no change in status, stable for surgery.  I have reviewed the patient's chart and labs.  Questions were answered to the patient's satisfaction.     Corky Crafts

## 2013-04-14 NOTE — Anesthesia Preprocedure Evaluation (Addendum)
Anesthesia Evaluation  Patient identified by MRN, date of birth, ID band Patient awake  General Assessment Comment:Emergency case with groin bleed as per Dr. Jettie Booze. CE  Reviewed: Allergy & Precautions, H&P , NPO status , Patient's Chart, lab work & pertinent test results  Airway       Dental   Pulmonary Current Smoker,          Cardiovascular Exercise Tolerance: Poor hypertension, + angina at rest + CAD, + Past MI and + Peripheral Vascular Disease + dysrhythmias  04-13-13 2 D ECHO - Left ventricle: The cavity size was normal. Wall thickness   was normal. Systolic function was normal. The estimated   ejection fraction was in the range of 50% to 55%. Possible   hypokinesis of the inferior myocardium. Features are   consistent with a pseudonormal left ventricular filling   pattern, with concomitant abnormal relaxation and   increased filling pressure (grade 2 diastolic   dysfunction). - Ventricular septum: Septal motion showed abnormal function   and dyssynergy. These changes are consistent with a left   bundle branch block. - Aortic valve: Mild regurgitation. - Mitral valve: Mild regurgitation.   Neuro/Psych Anxiety CVA, No Residual Symptoms    GI/Hepatic (+)     substance abuse  marijuana use,   Endo/Other    Renal/GU      Musculoskeletal   Abdominal   Peds  Hematology   Anesthesia Other Findings   Reproductive/Obstetrics                        Anesthesia Physical Anesthesia Plan  ASA: IV and emergent  Anesthesia Plan:    Post-op Pain Management:    Induction:   Airway Management Planned:   Additional Equipment:   Intra-op Plan:   Post-operative Plan:   Informed Consent:   Plan Discussed with:   Anesthesia Plan Comments:         Anesthesia Quick Evaluation

## 2013-04-14 NOTE — OR Nursing (Signed)
Patient arrived emergent from cath lab. Patient assessment done by Zenaida Niece RN

## 2013-04-14 NOTE — Progress Notes (Signed)
    S:  Received report from radiology re: CT results - large extraperitoneal hemorrhage and ? Hemoperitoneum.  Pt now back on floor.  Continues to report lower abdominal discomfort but slightly better than earlier.  Blood pressures remain soft/relatively hypotensive.  STAT CBC and T & S sent.  In process of transferring to CCU.  O:   Filed Vitals:   04/14/13 1102  BP: 95/69  Pulse:   Temp:   Resp:    Pleasant, nad, aaox3.  RLQ/R groin remains very tender - harder to palpation than previously noted.  DP 2+.  Non-contrast CT of abd/pelvis  IMPRESSION: 1. Large extraperitoneal hemorrhage centered in the right hemipelvis, with likely origin from the lower aspect of the right rectus abdominis musculature. There may be a very small volume of hemoperitoneum at this time, however, this is not definite. 2. 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 3. Additional incidental findings, as above. Critical Value/emergent results were called by telephone at the time of interpretation on 04/14/2013 at 11:06 AM to NP [facility], who verbally acknowledged these results.  A/P:  1.  Extraperitoneal bleed: s/p diagnostic cath.  As above, stat CBC/type & screen. Pt being transferred to CCU for closer monitoring.  Will continue to check serial H/H and have asked vascular surgery to evaluate.  Currently hemodynamically stable though pressures are soft.  Discussed case with Dr. Eldridge Dace.  Unfortunately, she is s/p drug-eluting stent placement to the LCX in January, thus discontinuation of asa/plavix not ideal at this time.  Transfuse as necessary.  Nicolasa Ducking, NP

## 2013-04-14 NOTE — Progress Notes (Signed)
    S: CTSP 2/2 rlq pain following diagnostic cath this AM.  Pt reports pain over the R groin and rlq with guarding.  No flank pain.  Per cath lab RN, she has been complaining of this pain since prior to sheath removal and during the case.  O:   Filed Vitals:   04/14/13 0755  BP:   Pulse: 75  Temp:   Resp:    Pleasant, nad though c/o of severe r groin and rlq pain.  The rlq and groin are very tender to even light palpation.  No evidence of hematoma, bleeding, or bruit.  No flank pain.  Distal pulses 2+ bilat.  A/P:  1.  R groin/RLQ pain:  S/p cath.  No evidence of bleeding or hematoma on exam though she is very tender.  Will obtain a stat noncontrast ct of the abd/pelvis to r/o rp bleed and check stat cbc.  BP's are soft - in the 90's - which is where she was trending prior to cath.  Will use prn tramadol for pain and avoid IV analgesics for fear of further hypotn.  Also, with EDP of , prefer not to bolus with saline.  Nicolasa Ducking, NP

## 2013-04-14 NOTE — Progress Notes (Signed)
Pt stating "I just dont feel right." Pt complaining of nausea and "seeing double". Blood pressure remains in 90s and HR remains NSR 60s. Rapid response nurse called to assess patient. Levonne Spiller, RN

## 2013-04-14 NOTE — Progress Notes (Signed)
Pt arrived from cath lab complaining of 10/10 abdominal and site pain. Solon Palm aware and at bedside. Levonne Spiller, RN

## 2013-04-14 NOTE — Op Note (Signed)
Procedure: Evacuation of right retroperitoneal hematoma, repair of right external iliac artery  Preoperative diagnosis: Right retroperitoneal hematoma status post cardiac catheterization  Postoperative diagnosis: Same  Anesthesia: Gen.  Assistant: Doreatha Massed PA-C  Operative findings: Large right retroperitoneal hematoma on the anterior abdominal wall, hole in right external iliac artery adjacent to laceration of right circumflex iliac branch  Operative details: After obtaining informed consent, the patient was taken to the operating room. The patient was placed in supine position on the operating room table. After induction of general anesthesia and endotracheal intubation, the anesthesia team placed a central line. Next patient was prepped and draped in usual sterile fashion including the right abdomen and entire right lower extremity. A longitudinal incision was made in the right groin and carried down through the subcutaneous tissues to the right common femoral artery. This was dissected free circumferentially. There was no blood within the groin. There is no needle entry site along the course of the entire common femoral artery. At this point there was some oozing up underneath the inguinal ligament. With the findings of no blood in the groin and the CT scan that had been reviewed preoperatively he was determined that the needle stick must be in the external iliac artery. At this point a curvilinear incision was made in the right lower quadrant. The incision was carried to the subcutaneous tissues down to level of the external oblique fascia. The fascia was opened along the line of the incision. The retroperitoneum was entered. Blunt dissection was used to stay extraperitoneal. The external iliac artery was identified. He was dissected free circumferentially and a vessel loop placed around this. I then proceeded using dissection from the abdomen and the groin to get to the area of bleeding. This  was directly under the inguinal ligament at the level of the left circumflex iliac branch. The needle entry site was along the medial wall of the artery and there was bleeding from the external iliac artery and a partially lacerated circumflex iliac artery branch. The distal end of the branch was ligated with a 3-0 silk tie. The medial aspect was ligated with a 3-0 silk tie as well but was still oozing. Therefore a figure-of-eight 5-0 Prolene suture was placed in the same area and hemostasis was achieved. At this point a large portion of the hematoma was evacuated from the antero medial retroperitoneum adjacent to the rectus muscle. This was thoroughly irrigated with saline solution. The external oblique fascial edges were then reapproximated with 2 layers of 2-0 Vicryl suture. The skin was closed with a 4 0 Vicryl subcuticular stitch. The right groin was then closed in multiple layers with a running 2 0 and 3 0 Vicryl suture followed by 4 Vicryl subcuticular stitch and the skin. Dermabond was applied to both incisions. The instrument count was not correct at the end of the case. There was a pair of scissors missing from the count. Therefore an x-ray of the lower abdomen and right lower quadrant was obtained which showed no retained foreign body. The patient was extubated in the operating room and taken to recovery room in stable condition. Instrument sponge and needle count was otherwise correct.  Fabienne Bruns, MD [facility] Office: [phone] Pager: [phone]

## 2013-04-14 NOTE — Anesthesia Postprocedure Evaluation (Signed)
  Anesthesia Post-op Note  Patient: Lindsay Camacho  Procedure(s) Performed: Procedure(s): Emergency Evacuation of Retroperitoneal Hematoma and Repair Right External Iliac Artery Pseudoaneurysm     (Right)  Patient Location: PACU  Anesthesia Type:General  Level of Consciousness: awake  Airway and Oxygen Therapy: Patient Spontanous Breathing  Post-op Pain: mild  Post-op Assessment: Post-op Vital signs reviewed  Post-op Vital Signs: Reviewed  Last Vitals:  Filed Vitals:   04/14/13 1530  BP: 142/74  Pulse: 77  Temp: 36.5 C  Resp: 10    Complications: No apparent anesthesia complications

## 2013-04-14 NOTE — Anesthesia Procedure Notes (Addendum)
Procedure Name: Intubation Date/Time: 04/14/2013 12:28 PM Performed by: Gwenyth Allegra Pre-anesthesia Checklist: Patient identified, Timeout performed, Emergency Drugs available, Suction available and Patient being monitored Patient Re-evaluated:Patient Re-evaluated prior to inductionOxygen Delivery Method: Circle system utilized Preoxygenation: Pre-oxygenation with 100% oxygen Intubation Type: IV induction, Rapid sequence and Cricoid Pressure applied Laryngoscope Size: Mac and 3 Grade View: Grade I Tube size: 7.5 mm Number of attempts: 1 Airway Equipment and Method: Stylet Secured at: 22 cm Tube secured with: Tape Dental Injury: Teeth and Oropharynx as per pre-operative assessment     RIJ CVP Dual Lumen Intraop: The patient was identified and consent obtained.  TO was performed, and full barrier precautions were used.  The skin was anesthetized with lidocaine.  Once the vein was located with the 22 ga. needle using ultrasound guidance , the wire was inserted into the vein.  The wire location was confirmed with ultrasound.  The tissue was dilated and the catheter was carefully inserted, then sutured in place. A dressing was applied. The patient tolerated the procedure well.   CE

## 2013-04-14 NOTE — Consult Note (Signed)
[facility]  [facility]ISTORY AND PHYSICAL   History of Present Illness:  Patient is a 48 y.o. year old female who presents for evaluation of Right groin/extraperitoneal hematoma right side post cath earlier today.  Pt in severe pain post cath.  CT shows large hematoma posterior rectus/intraperitoneal.  Other medical problems include has Chest pain, musculoskeletal; HTN (hypertension); LBBB (left bundle branch block); Tobacco abuse; NSTEMI (non-ST elevated myocardial infarction); CAD - CFX DES 01/17/13; Cardiomyopathy, ischemic- EF 40-45% by echo 01/18/13; Rash post PCI; Chest pain; ACS (acute coronary syndrome); and Intermediate coronary syndrome on her problem list.   Past Medical History  Diagnosis Date  . Hypertension   . Stroke     tia with no deficits  . CAD (coronary artery disease) 11/13/2011    50% ? proximal LCx stenosis [facility]  . PVD (peripheral vascular disease)     Right CFA stenosis per report on 11/14/2011  . HLD (hyperlipidemia)   . LBBB (left bundle branch block)   . Acute myocardial infarction of other lateral wall, initial episode of care   . Intermediate coronary syndrome     Past Surgical History  Procedure Laterality Date  . Cardiac catheterization    . Tubal ligation    . Appendectomy      Social History History  Substance Use Topics  . Smoking status: Current Every Day Smoker -- 0.50 packs/day  . Smokeless tobacco: Never Used  . Alcohol Use: No    Family History Family History  Problem Relation Age of Onset  . CAD Mother [id number]  . Lung cancer Mother   . Bladder Cancer Mother   . Stroke Mother   . CAD Father [id number]    Allergies  Allergies  Allergen Reactions  . Brilinta [Ticagrelor]     Dyspnea  . Other Other (See Comments)    BP med ?amaldemac. BP bottomed out  . Penicillins Rash     Current Facility-Administered Medications  Medication Dose Route Frequency Provider Last Rate Last Dose  . 0.9 %  sodium chloride  infusion  250 mL Intravenous PRN Ardis RowanMatthew Whitlock, MD      . 0.9 %  sodium chloride infusion  1 mL/kg/hr Intravenous Continuous Corky CraftsJayadeep S Varanasi, MD 55.3 mL/hr at 04/14/13 0830 1 mL/kg/hr at 04/14/13 0830  . acetaminophen (TYLENOL) tablet 975 mg  975 mg Oral TID PRN Joline Salthonda G Barrett, PA-C   975 mg at 04/11/13 1258  . aspirin chewable tablet 81 mg  81 mg Oral Daily Ardis RowanMatthew Whitlock, MD   81 mg at 04/13/13 0809  . atorvastatin (LIPITOR) tablet 40 mg  40 mg Oral q1800 Lars MassonKatarina H Nelson, MD   40 mg at 04/13/13 1622  . carvedilol (COREG) tablet 3.125 mg  3.125 mg Oral BID WC Lars MassonKatarina H Nelson, MD   3.125 mg at 04/13/13 1622  . clopidogrel (PLAVIX) tablet 75 mg  75 mg Oral Q breakfast Ardis RowanMatthew Whitlock, MD   75 mg at 04/14/13 0553  . HYDROcodone-acetaminophen (NORCO/VICODIN) 5-325 MG per tablet 1 tablet  1 tablet Oral Q4H PRN Leeann MustJacob Kelly, MD   1 tablet at 04/13/13 2205  . morphine 2 MG/ML injection 2-4 mg  2-4 mg Intravenous Q1H PRN Joline Salthonda G Barrett, PA-C   2 mg at 04/14/13 0857  . nicotine (NICODERM CQ - dosed in mg/24 hours) patch 14 mg  14 mg Transdermal Daily Ardis RowanMatthew Whitlock, MD   14 mg at 04/14/13 0553  . nitroGLYCERIN (NITROGLYN) 2 % ointment 1  inch  1 inch Topical 4 times per day Darrol Jump, PA-C   1 inch at 04/13/13 1623  . nitroGLYCERIN (NITROSTAT) SL tablet 0.4 mg  0.4 mg Sublingual Q5 Min x 3 PRN Ardis Rowan, MD   0.4 mg at 04/11/13 1924  . ondansetron (ZOFRAN) injection 4 mg  4 mg Intravenous Q6H PRN Wilburt Finlay, PA-C   4 mg at 04/14/13 1058  . sodium chloride 0.9 % injection 3 mL  3 mL Intravenous Q12H Ardis Rowan, MD   3 mL at 04/11/13 1055  . sodium chloride 0.9 % injection 3 mL  3 mL Intravenous PRN Ardis Rowan, MD   3 mL at 04/11/13 1055  . traMADol (ULTRAM) tablet 50-100 mg  50-100 mg Oral Q6H PRN Joline Salt Barrett, PA-C   100 mg at 04/14/13 0951    ROS:   Unable to obtain secondary to pain  Physical Examination  Filed Vitals:   04/14/13 0956 04/14/13 0957  04/14/13 1058 04/14/13 1102  BP: 86/60 84/60 92/65  95/69  Pulse:      Temp:      TempSrc:      Resp:      Height:      Weight:      SpO2:        Body mass index is 22.29 kg/(m^2).  General:  Alert and oriented, no acute distress HEENT: Normal Abdomen: right lower quadrant pain on palpation, well healed right lower quadrant scar Skin: No rash Extremity Pulses:  femoral and posterior tibial pulses bilaterally Musculoskeletal: No deformity or edema  Neurologic: Upper and lower extremity motor 5/5 and symmetric  DATA:  CT scan images reviewed as per HPI   ASSESSMENT:  Extraperitoneal bleed most likely puncture of external iliac artery with ongoing bleeding hypotension pain   PLAN:  To OR emergently for exploration/repair. Plan procedure risks discussed with pt.  Fabienne Bruns, MD [facility] [place] Office: [phone] Pager: [phone]

## 2013-04-14 NOTE — H&P (View-Only) (Signed)
   INTERVENTIONAL CARDIOLOGY CONSULT  Subjective: The patient is 48 and has history of CAD with at least 2 prior coronary angiograms. Had mild to moderate Cfx diease at [facility] 2014 and presented to [facility] in January 2015 with subtotaled Cfx and NSTEMI, requiring Cfx stent.  Has been having increasing chest pain at work since February, and since admission has had continuous pain but with negative markers. She has left bundle on ECG. In addition to a sharp electric like pain she also describes a tight feeling. She has developed nausea and vomiting this AM. She is currently resting comfortably but when questioned, asks for pain medications. She denies dyspnea and chest pain currently.   Reviewed angiograms from January 2015 where she received DES to circumflex for basically single vessel disease with very nice result. The LAD and RCA had liminal irregularity. Objective: BP 104/68, HR 70  Skin is warm and dry Chest is clear Cardiac exam is without gallop or rub, but 1/6 systolic murmur is noted. Abdomen is soft. Extremities reveal decreased right radial pulse. Otherwise pulses are 2+ and symmetric Neuro reveals an anxious lady who appears older than her stated age. No focal deficits  ECG with new lateral Q waves in setting of LBBB  Assessment: Atypical symptoms in this 48 year old with h/o Cfx stent and now new Q waves on ECG in 1 And AVL  Plan:  Troponin I. If elevated troponin will cath today, otherwise elective study tomorrow.

## 2013-04-14 NOTE — Telephone Encounter (Addendum)
Message copied by Fredrich Birks on Mon Apr 14, 2013  2:58 PM ------      Message from: [place], [place] K      Created: Mon Apr 14, 2013  2:23 PM      Regarding: Schedule                   ----- Message -----         From: Dara Lords, PA-C         Sent: 04/14/2013   2:19 PM           To: Vvs Charge Pool            S/p evacuation of retroperitoneal hematoma and repair of right femoral artery pseudoaneurysm 04/14/13.            F/u with Dr. Darrick Penna in 2 weeks.            Thanks,      Samantha ------  04/14/13: lm for pt re appt, dpm

## 2013-04-14 NOTE — CV Procedure (Signed)
    PROCEDURE:  Left heart catheterization with selective coronary angiography, left ventriculogram.  INDICATIONS:  Unstable angina  The risks, benefits, and details of the procedure were explained to the patient.  The patient verbalized understanding and wanted to proceed.  Informed written consent was obtained.  PROCEDURE TECHNIQUE:  Femoral approach was selected since the patient declined a radial approach. When she had a radial cath in January, she had significant vasospasm and pain with that. Despite her known peripheral vascular disease, she preferred the femoral approach. We knew he would have to try to have the more proximal portion of the common femoral artery due to the known disease from prior report. After Xylocaine anesthesia a 30F sheath was placed in the right femoral artery with a single anterior needle wall stick.   Left coronary angiography was done using a Judkins L4 guide catheter.  Right coronary angiography was done using a Judkins R4 guide catheter.  Left ventriculography was done using a pigtail catheter.    CONTRAST:  Total of 80 cc.  COMPLICATIONS:  None.    HEMODYNAMICS:  Aortic pressure was 140/76; LV pressure was 140/13; LVEDP 18.  There was no gradient between the left ventricle and aorta.    ANGIOGRAPHIC DATA:   The left main coronary artery is widely patent.  The left anterior descending artery is a large vessel. There is mild atherosclerosis in the mid vessel. The first 2 diagonals are medium sized and widely patent. The third diagonal is large and widely patent.  The left circumflex artery is a large vessel. The proximal to mid stent is widely patent. There is a large first obtuse marginal which is widely patent..  The right coronary artery is a large dominant vessel. Proximally, there is mild atherosclerosis. The posterior descending is medium size. The posterior lateral artery a small. Both branches are patent.Marland Kitchen  LEFT VENTRICULOGRAM:  Left ventricular  angiogram was done in the 30 RAO projection and revealed normal left ventricular wall motion and systolic function with an estimated ejection fraction of 50%.  LVEDP was 18 mmHg.  FEMORAL ANGIOGRAM: This revealed a 50-60% eccentric stenosis, in the proximal right common femoral artery.  The femoral entry site appears to be right at the pelvic brim.  Will hold pressure for an extra few minutes during the groin hold.  IMPRESSIONS:  1. Normal left main coronary artery. 2. Widely patent left anterior descending artery and its branches. 3. Widely patent stent in the proximal left circumflex artery. Widely patent branches. 4. Mild atherosclerosis in the proximal right coronary artery. 5. Normal left ventricular systolic function.  LVEDP 18 mmHg.  Ejection fraction 50%.  RECOMMENDATION:  Continue aggressive secondary prevention.  We stressed the importance of smoking cessation.  She will need to continue dual antiplatelet therapy. Causes of noncardiac chest pain should be investigated.

## 2013-04-14 NOTE — Progress Notes (Signed)
Pt given 250cc bolus per Ward Givens verbal order and transported to CT for stat CT abd/pelvis. Will continue to monitor. Levonne Spiller, RN

## 2013-04-15 ENCOUNTER — Encounter (HOSPITAL_COMMUNITY): Payer: Self-pay | Admitting: Vascular Surgery

## 2013-04-15 DIAGNOSIS — R58 Hemorrhage, not elsewhere classified: Secondary | ICD-10-CM | POA: Diagnosis not present

## 2013-04-15 DIAGNOSIS — E785 Hyperlipidemia, unspecified: Secondary | ICD-10-CM

## 2013-04-15 DIAGNOSIS — D500 Iron deficiency anemia secondary to blood loss (chronic): Secondary | ICD-10-CM

## 2013-04-15 DIAGNOSIS — E876 Hypokalemia: Secondary | ICD-10-CM

## 2013-04-15 LAB — CBC
HEMATOCRIT: 30.8 % — AB (ref 36.0–46.0)
HEMOGLOBIN: 10.7 g/dL — AB (ref 12.0–15.0)
MCH: 30.1 pg (ref 26.0–34.0)
MCHC: 34.7 g/dL (ref 30.0–36.0)
MCV: 86.5 fL (ref 78.0–100.0)
Platelets: 106 10*3/uL — ABNORMAL LOW (ref 150–400)
RBC: 3.56 MIL/uL — AB (ref 3.87–5.11)
RDW: 13.9 % (ref 11.5–15.5)
WBC: 7 10*3/uL (ref 4.0–10.5)

## 2013-04-15 LAB — BASIC METABOLIC PANEL
BUN: 5 mg/dL — ABNORMAL LOW (ref 6–23)
CHLORIDE: 104 meq/L (ref 96–112)
CO2: 24 mEq/L (ref 19–32)
CREATININE: 0.6 mg/dL (ref 0.50–1.10)
Calcium: 7.8 mg/dL — ABNORMAL LOW (ref 8.4–10.5)
GFR calc Af Amer: >90 mL/min (ref >90)
GFR calc non Af Amer: >90 mL/min (ref >90)
GLUCOSE: 79 mg/dL (ref 70–99)
POTASSIUM: 3.6 meq/L — AB (ref 3.7–5.3)
Sodium: 138 mEq/L (ref 137–147)

## 2013-04-15 LAB — HEMOGLOBIN AND HEMATOCRIT, BLOOD
HEMATOCRIT: 31.2 % — AB (ref 36.0–46.0)
Hemoglobin: 10.6 g/dL — ABNORMAL LOW (ref 12.0–15.0)

## 2013-04-15 MED ORDER — ROSUVASTATIN CALCIUM 10 MG PO TABS
10.0000 mg | ORAL_TABLET | Freq: Every day | ORAL | Status: DC
  Filled 2013-04-15: qty 1

## 2013-04-15 MED ORDER — POTASSIUM CHLORIDE CRYS ER 20 MEQ PO TBCR
40.0000 meq | EXTENDED_RELEASE_TABLET | Freq: Once | ORAL | Status: AC
  Administered 2013-04-15: 40 meq via ORAL
  Filled 2013-04-15: qty 2

## 2013-04-15 MED ORDER — ISOSORBIDE MONONITRATE ER 30 MG PO TB24
30.0000 mg | ORAL_TABLET | Freq: Every day | ORAL | Status: DC
  Administered 2013-04-15 – 2013-04-18 (×4): 30 mg via ORAL
  Filled 2013-04-15 (×4): qty 1

## 2013-04-15 MED ORDER — ROSUVASTATIN CALCIUM 20 MG PO TABS
20.0000 mg | ORAL_TABLET | Freq: Every day | ORAL | Status: DC
  Administered 2013-04-15 – 2013-04-17 (×3): 20 mg via ORAL
  Filled 2013-04-15 (×4): qty 1

## 2013-04-15 NOTE — Progress Notes (Signed)
Subjective:  Complaining of pain everywhere.  Objective:   Vital Signs in the last 24 hours: Temp:  [95.5 F (35.3 C)-98.9 F (37.2 C)] 98.9 F (37.2 C) (04/14 1100) Pulse Rate:  [63-95] 83 (04/14 1147) Resp:  [5-27] 19 (04/14 1147) BP: (97-153)/(39-81) 105/53 mmHg (04/14 1147) SpO2:  [93 %-100 %] 98 % (04/14 1147) Arterial Line BP: (80-137)/(55-85) 102/72 mmHg (04/14 0515)  Intake/Output from previous day: 04/13 0701 - 04/14 0700 In: 3125.7 [P.O.:150; I.V.:1840.7; Blood:685; IV Piggyback:450] Out: 4132 [GMWNU:2725; Blood:400]  Medications: . carvedilol  3.125 mg Oral BID WC  . docusate sodium  100 mg Oral Daily  . isosorbide mononitrate  30 mg Oral Daily  . nicotine  14 mg Transdermal Daily  . pantoprazole  40 mg Oral Daily  . potassium chloride  40 mEq Oral Once  . rosuvastatin  20 mg Oral QHS  . sodium chloride  3 mL Intravenous Q12H    . DOPamine      Physical Exam:   General appearance: alert, appears older than stated age and no distress Neck: no adenopathy, no JVD, supple, symmetrical, trachea midline and thyroid not enlarged, symmetric, no tenderness/mass/nodules Lungs: decreased  BS Heart: regular rate and rhythm and 2/6 sem Abdomen: soft, non-tender; bowel sounds normal; no masses,  no organomegaly; curvilinear incision RLQ Extremities: no edema, redness or tenderness in the calves or thighs Skin: Skin color, texture, turgor normal. No rashes or lesions Neurologic: Grossly normal   Rate: 80  Rhythm: normal sinus rhythm  Lab Results:     Recent Labs  04/14/13 1251 04/15/13 0224  NA 138 138  K 3.8 3.6*  CL  --  104  CO2  --  24  GLUCOSE  --  79  BUN  --  5*  CREATININE  --  0.60   CBC    Component Value Date/Time   WBC 7.0 04/15/2013 0224   RBC 3.56* 04/15/2013 0224   HGB 10.7* 04/15/2013 0224   HCT 30.8* 04/15/2013 0224   PLT 106* 04/15/2013 0224   MCV 86.5 04/15/2013 0224   MCH 30.1 04/15/2013 0224   MCHC 34.7 04/15/2013 0224   RDW  13.9 04/15/2013 0224   LYMPHSABS 2.3 04/11/2013 0143   MONOABS 0.5 04/11/2013 0143   EOSABS 0.3 04/11/2013 0143   BASOSABS 0.0 04/11/2013 0143     Recent Labs  04/12/13 1955 04/13/13 1310  TROPONINI <0.30 <0.30   Hepatic Function Panel No results found for this basename: PROT, ALBUMIN, AST, ALT, ALKPHOS, BILITOT, BILIDIR, IBILI,  in the last 72 hours No results found for this basename: INR,  in the last 72 hours BNP (last 3 results)  Recent Labs  01/17/13 1957  PROBNP 4026.0*    Lipid Panel     Component Value Date/Time   CHOL 191 01/20/2013 0930   TRIG 117 01/20/2013 0930   HDL 28* 01/20/2013 0930   CHOLHDL 6.8 01/20/2013 0930   VLDL 23 01/20/2013 0930   LDLCALC 140* 01/20/2013 0930      Imaging:  Ct Abdomen Pelvis Wo Contrast  04/14/2013   CLINICAL DATA:  Status post catheterization. Right growing and right lower quadrant pain. Evaluate for hemorrhage.  EXAM: CT ABDOMEN AND PELVIS WITHOUT CONTRAST  TECHNIQUE: Multidetector CT imaging of the abdomen and pelvis was performed following the standard protocol without intravenous contrast.  COMPARISON:  No priors.  FINDINGS: Lung Bases: Minimal bibasilar subsegmental atelectasis.  Abdomen/Pelvis: There is a large collection of extraperitoneal high attenuation material in the  right hemipelvis which measures at least 10.4 x 9.1 x 8.3 cm. Findings are compatible with acute hemorrhage. This appears intimately associated with the posterior aspect of the lower right rectus abdominis musculature, but extends along the right pelvic sidewall posteriorly as well. This exerts significant mass effect displacing adjacent structures from the right side of the pelvis. There may be a very small volume of hemoperitoneum at this time, although this is not definite.  The appearance of the liver, gallbladder, pancreas, spleen, bilateral adrenal glands and bilateral kidneys is unremarkable. Extensive atherosclerosis throughout the abdominal and pelvic  vasculature, without evidence of aneurysm. No significant volume of ascites. No pneumoperitoneum. No pathologic distention of small bowel. Foley balloon catheter is present within the lumen of the urinary bladder which is completely decompressed. Notably, the urinary bladder is significantly displaced into the left side of the pelvis related to mass effect of the patient's large right-sided hemorrhage.  Musculoskeletal: There are no aggressive appearing lytic or blastic lesions noted in the visualized portions of the skeleton.  IMPRESSION: 1. Large extraperitoneal hemorrhage centered in the right hemipelvis, with likely origin from the lower aspect of the right rectus abdominis musculature. There may be a very small volume of hemoperitoneum at this time, however, this is not definite. 2. 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 3. Additional incidental findings, as above. Critical Value/emergent results were called by telephone at the time of interpretation on 04/14/2013 at 11:06 AM to [facility], who verbally acknowledged these results.   Electronically Signed   By: Trudie Reedaniel  Entrikin M.D.   On: 04/14/2013 11:07   Dg Chest Port 1 View  04/14/2013   CLINICAL DATA:  Central line placement.  EXAM: PORTABLE CHEST - 1 VIEW  COMPARISON:  Chest x-ray 04/11/2013.  FINDINGS: There is a right-sided internal jugular central venous catheter with tip terminating in the distal superior vena cava. No pneumothorax. Patchy interstitial prominence throughout the lungs bilaterally, slightly asymmetric involving the left lung to a greater extent than the right, favored to reflect some asymmetric pulmonary edema. Pulmonary vasculature appears mildly engorged. Heart size is upper limits of normal. Upper mediastinal contours are within normal limits. No definite pleural effusions.  IMPRESSION: 1. Interval placement of right internal jugular central venous catheter with tip in the distal superior  vena cava. 2. Diffuse interstitial prominence favored to reflect some mildly asymmetric pole interstitial pulmonary edema. Attention on followups is recommended to exclude the possibility of recent aspiration.   Electronically Signed   By: Trudie Reedaniel  Entrikin M.D.   On: 04/14/2013 15:51   Dg Or Local Abdomen  04/14/2013   CLINICAL DATA:  48 year old female with incorrect instrument count, Large scissors implicated. Initial encounter.  EXAM: OR LOCAL ABDOMEN  COMPARISON:  CT Abdomen and Pelvis 04/14/2013.  FINDINGS: Portable AP supine view at 1421 hrs. No radiopaque foreign body identified. Visualized bowel gas pattern is non obstructed. Stable visualized osseous structures.  IMPRESSION: No retained surgical or radiopaque foreign body identified in the lower abdomen or pelvis.  Study discussed by telephone with Provicer Jeanice LimKathy Hunt in the OR on 04/14/2013 at 14:26 .   Electronically Signed   By: Augusto GambleLee  Hall M.D.   On: 04/14/2013 14:26    Cardiac Cath ANGIOGRAPHIC DATA: The left main coronary artery is widely patent.  The left anterior descending artery is a large vessel. There is mild atherosclerosis in the mid vessel. The first 2 diagonals are medium sized and widely patent. The third diagonal is large  and widely patent.  The left circumflex artery is a large vessel. The proximal to mid stent is widely patent. There is a large first obtuse marginal which is widely patent..  The right coronary artery is a large dominant vessel. Proximally, there is mild atherosclerosis. The posterior descending is medium size. The posterior lateral artery a small. Both branches are patent.Marland Kitchen  LEFT VENTRICULOGRAM: Left ventricular angiogram was done in the 30 RAO projection and revealed normal left ventricular wall motion and systolic function with an estimated ejection fraction of 50%. LVEDP was 18 mmHg.  FEMORAL ANGIOGRAM: This revealed a 50-60% eccentric stenosis, in the proximal right common femoral artery. The femoral entry  site appears to be right at the pelvic brim. Will hold pressure for an extra few minutes during the groin hold.  IMPRESSIONS:  1. Normal left main coronary artery. 2. Widely patent left anterior descending artery and its branches. 3. Widely patent stent in the proximal left circumflex artery. Widely patent branches. 4. Mild atherosclerosis in the proximal right coronary artery. 5. Normal left ventricular systolic function. LVEDP 18 mmHg. Ejection fraction 50%.    Assessment/Plan:   Active Problems:   Chest pain   ACS (acute coronary syndrome)   Intermediate coronary syndrome   Retroperitoneal bleed   Anemia due to blood loss   Hyperlipidemia LDL goal < 70  No angina. Cath findings as above. Medical therapy for mild CAD. Will change topical nitrates to imdur 30 mg. LDL 140, titrate crestor 20 mg. K replete.  Vascular surgery following s/p exploratory lap yesterday. With pain will keep in unit today.     Lennette Bihari, MD, [facility] 04/15/2013, 11:59 AM

## 2013-04-15 NOTE — Progress Notes (Addendum)
[facility] Progress Note  04/15/2013 7:47 AM 1 Day Post-Op  Subjective:  C/o pain; only nausea she has had is with biotene-tolerating liquids without difficulty  Afebrile VSS 98% 2LO2NC   Filed Vitals:   04/15/13 0530  BP:   Pulse: 90  Temp:   Resp: 19    Physical Exam: Incisions:  Abdominal and groin wound is c/d/i Extremities:  + palpable right DP/PT; right foot is warm. Abdomen:  Soft NT/ND  CBC    Component Value Date/Time   WBC 7.0 04/15/2013 0224   RBC 3.56* 04/15/2013 0224   HGB 10.7* 04/15/2013 0224   HCT 30.8* 04/15/2013 0224   PLT 106* 04/15/2013 0224   MCV 86.5 04/15/2013 0224   MCH 30.1 04/15/2013 0224   MCHC 34.7 04/15/2013 0224   RDW 13.9 04/15/2013 0224   LYMPHSABS 2.3 04/11/2013 0143   MONOABS 0.5 04/11/2013 0143   EOSABS 0.3 04/11/2013 0143   BASOSABS 0.0 04/11/2013 0143    BMET    Component Value Date/Time   NA 138 04/15/2013 0224   K 3.6* 04/15/2013 0224   CL 104 04/15/2013 0224   CO2 24 04/15/2013 0224   GLUCOSE 79 04/15/2013 0224   BUN 5* 04/15/2013 0224   CREATININE 0.60 04/15/2013 0224   CALCIUM 7.8* 04/15/2013 0224   GFRNONAA >90 04/15/2013 0224   GFRAA >90 04/15/2013 0224    INR    Component Value Date/Time   INR 0.98 04/11/2013 0143     Intake/Output Summary (Last 24 hours) at 04/15/13 0747 Last data filed at 04/15/13 0500  Gross per 24 hour  Intake 3125.67 ml  Output   3275 ml  Net -149.33 ml     Assessment:  48 y.o. female is s/p:  Evacuation of right retroperitoneal hematoma, repair of right external iliac artery  1 Day Post-Op  Plan: -pt doing well this am with the exception of some pain.  She has palpable right PT/DP pulses -acute blood loss anemia-received 2 units PRBC's -dry dressing to right groin daily and as needed to keep right groin as dry as possible.  Dressing to abdominal incision daily -DVT prophylaxis:  No Lovenox this admission.  Continue to use SCD's for DVT prophylaxis -OOB to chair tid with  meals   Doreatha Massed, PA-C [facility] [phone] 04/15/2013 7:47 AM    Pain post op today Right foot 2+ PT pulse S/p pseudoaneurysm and drainage of RP hematoma Needs to get out of bed Ok to transfer to 2W from my standpoint  Fabienne Bruns, MD [facility] [place] Office: [phone] Pager: [phone]

## 2013-04-16 DIAGNOSIS — I447 Left bundle-branch block, unspecified: Secondary | ICD-10-CM

## 2013-04-16 LAB — CBC
HCT: 29.6 % — ABNORMAL LOW (ref 36.0–46.0)
HEMOGLOBIN: 10 g/dL — AB (ref 12.0–15.0)
MCH: 29.8 pg (ref 26.0–34.0)
MCHC: 33.8 g/dL (ref 30.0–36.0)
MCV: 88.1 fL (ref 78.0–100.0)
PLATELETS: 117 10*3/uL — AB (ref 150–400)
RBC: 3.36 MIL/uL — AB (ref 3.87–5.11)
RDW: 13.9 % (ref 11.5–15.5)
WBC: 7.3 10*3/uL (ref 4.0–10.5)

## 2013-04-16 LAB — HEMOGLOBIN AND HEMATOCRIT, BLOOD
HCT: 30.2 % — ABNORMAL LOW (ref 36.0–46.0)
HCT: 29.3 % — ABNORMAL LOW (ref 36.0–46.0)
HEMATOCRIT: 28.4 % — AB (ref 36.0–46.0)
HEMOGLOBIN: 10 g/dL — AB (ref 12.0–15.0)
HEMOGLOBIN: 10.2 g/dL — AB (ref 12.0–15.0)
Hemoglobin: 9.6 g/dL — ABNORMAL LOW (ref 12.0–15.0)

## 2013-04-16 MED ORDER — CARVEDILOL 6.25 MG PO TABS
6.2500 mg | ORAL_TABLET | Freq: Two times a day (BID) | ORAL | Status: DC
  Administered 2013-04-16 – 2013-04-18 (×3): 6.25 mg via ORAL
  Filled 2013-04-16 (×6): qty 1

## 2013-04-16 NOTE — Progress Notes (Addendum)
[facility] Progress Note  04/16/2013 10:04 AM 2 Days Post-Op  Subjective:  C/o pain; tolerating food without difficulty  Tm 99.3 now afebrile VSS  Filed Vitals:   04/16/13 0808  BP: 135/60  Pulse: 97  Temp: 98.3 F (36.8 C)  Resp: 23   I  CBC    Component Value Date/Time   WBC 7.3 04/16/2013 0300   RBC 3.36* 04/16/2013 0300   HGB 10.0* 04/16/2013 0300   HCT 29.6* 04/16/2013 0300   PLT 117* 04/16/2013 0300   MCV 88.1 04/16/2013 0300   MCH 29.8 04/16/2013 0300   MCHC 33.8 04/16/2013 0300   RDW 13.9 04/16/2013 0300   LYMPHSABS 2.3 04/11/2013 0143   MONOABS 0.5 04/11/2013 0143   EOSABS 0.3 04/11/2013 0143   BASOSABS 0.0 04/11/2013 0143    BMET    Component Value Date/Time   NA 138 04/15/2013 0224   K 3.6* 04/15/2013 0224   CL 104 04/15/2013 0224   CO2 24 04/15/2013 0224   GLUCOSE 79 04/15/2013 0224   BUN 5* 04/15/2013 0224   CREATININE 0.60 04/15/2013 0224   CALCIUM 7.8* 04/15/2013 0224   GFRNONAA >90 04/15/2013 0224   GFRAA >90 04/15/2013 0224    INR    Component Value Date/Time   INR 0.98 04/11/2013 0143     Intake/Output Summary (Last 24 hours) at 04/16/13 1004 Last data filed at 04/16/13 0800  Gross per 24 hour  Intake    460 ml  Output   2050 ml  Net  -1590 ml     Assessment:  48 y.o. female is s/p:  Evacuation of right retroperitoneal hematoma, repair of right external iliac artery   2 Days Post-Op  Plan: -acute blood loss anemia down slightly today, but stable and pt is tolerating -DVT prophylaxis:  None this admission as pt is high risk of bleeding.  Continue to use SCD's for DVT prophylaxis -continue to mobilize -will check wounds later today as pt was up in chair at time of visit.   Doreatha Massed, PA-C [facility] [phone] 04/16/2013 10:04 AM    PT pulse right foot Incisions healing Still needs to get out of bed and walk Home when ambulatory   Fabienne Bruns, MD [facility]  [place] Office: [phone] Pager: [phone]

## 2013-04-16 NOTE — Progress Notes (Signed)
Subjective:  No chest pain; still complains of pain at surgical site  Objective:   Vital Signs in the last 24 hours: Temp:  [97.5 F (36.4 C)-99.3 F (37.4 C)] 98.3 F (36.8 C) (04/15 0808) Pulse Rate:  [76-101] 97 (04/15 0808) Resp:  [15-26] 23 (04/15 0808) BP: (97-158)/(40-73) 135/60 mmHg (04/15 0808) SpO2:  [90 %-100 %] 97 % (04/15 0808)  Intake/Output from previous day: 04/14 0701 - 04/15 0700 In: 240 [I.V.:240] Out: 2050 [Urine:2050]  Medications: . carvedilol  3.125 mg Oral BID WC  . docusate sodium  100 mg Oral Daily  . isosorbide mononitrate  30 mg Oral Daily  . nicotine  14 mg Transdermal Daily  . pantoprazole  40 mg Oral Daily  . rosuvastatin  20 mg Oral QHS  . sodium chloride  3 mL Intravenous Q12H    . DOPamine      Physical Exam:   General appearance: alert, appears older than stated age and no distress Neck: no adenopathy, no carotid bruit, no JVD, supple, symmetrical, trachea midline and thyroid not enlarged, symmetric, no tenderness/mass/nodules Lungs: decreased BS; no wheezing Heart: RRR 1/6 SEM Abdomen: soft, non-tender; bowel sounds normal; no masses,  no organomegaly; tenderness over incisional area R groin region Extremities: no edema, redness or tenderness in the calves or thighs Skin: Skin color, texture, turgor normal. No rashes or lesions Neurologic: Grossly normal   Rate: 95  Rhythm: normal sinus rhythm  Lab Results:    Recent Labs  04/14/13 1251 04/15/13 0224  NA 138 138  K 3.8 3.6*  CL  --  104  CO2  --  24  GLUCOSE  --  79  BUN  --  5*  CREATININE  --  0.60   CBC    Component Value Date/Time   WBC 7.3 04/16/2013 0300   RBC 3.36* 04/16/2013 0300   HGB 10.0* 04/16/2013 0300   HCT 29.6* 04/16/2013 0300   PLT 117* 04/16/2013 0300   MCV 88.1 04/16/2013 0300   MCH 29.8 04/16/2013 0300   MCHC 33.8 04/16/2013 0300   RDW 13.9 04/16/2013 0300   LYMPHSABS 2.3 04/11/2013 0143   MONOABS 0.5 04/11/2013 0143   EOSABS 0.3 04/11/2013  0143   BASOSABS 0.0 04/11/2013 0143     Recent Labs  04/13/13 1310  TROPONINI <0.30   Hepatic Function Panel No results found for this basename: PROT, ALBUMIN, AST, ALT, ALKPHOS, BILITOT, BILIDIR, IBILI,  in the last 72 hours No results found for this basename: INR,  in the last 72 hours BNP (last 3 results)  Recent Labs  01/17/13 1957  PROBNP 4026.0*    Lipid Panel     Component Value Date/Time   CHOL 191 01/20/2013 0930   TRIG 117 01/20/2013 0930   HDL 28* 01/20/2013 0930   CHOLHDL 6.8 01/20/2013 0930   VLDL 23 01/20/2013 0930   LDLCALC 140* 01/20/2013 0930      Imaging:  Ct Abdomen Pelvis Wo Contrast  04/14/2013   CLINICAL DATA:  Status post catheterization. Right growing and right lower quadrant pain. Evaluate for hemorrhage.  EXAM: CT ABDOMEN AND PELVIS WITHOUT CONTRAST  TECHNIQUE: Multidetector CT imaging of the abdomen and pelvis was performed following the standard protocol without intravenous contrast.  COMPARISON:  No priors.  FINDINGS: Lung Bases: Minimal bibasilar subsegmental atelectasis.  Abdomen/Pelvis: There is a large collection of extraperitoneal high attenuation material in the right hemipelvis which measures at least 10.4 x 9.1 x 8.3 cm. Findings are compatible with  acute hemorrhage. This appears intimately associated with the posterior aspect of the lower right rectus abdominis musculature, but extends along the right pelvic sidewall posteriorly as well. This exerts significant mass effect displacing adjacent structures from the right side of the pelvis. There may be a very small volume of hemoperitoneum at this time, although this is not definite.  The appearance of the liver, gallbladder, pancreas, spleen, bilateral adrenal glands and bilateral kidneys is unremarkable. Extensive atherosclerosis throughout the abdominal and pelvic vasculature, without evidence of aneurysm. No significant volume of ascites. No pneumoperitoneum. No pathologic distention of small  bowel. Foley balloon catheter is present within the lumen of the urinary bladder which is completely decompressed. Notably, the urinary bladder is significantly displaced into the left side of the pelvis related to mass effect of the patient's large right-sided hemorrhage.  Musculoskeletal: There are no aggressive appearing lytic or blastic lesions noted in the visualized portions of the skeleton.  IMPRESSION: 1. Large extraperitoneal hemorrhage centered in the right hemipelvis, with likely origin from the lower aspect of the right rectus abdominis musculature. There may be a very small volume of hemoperitoneum at this time, however, this is not definite. 2. 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 3. Additional incidental findings, as above. Critical Value/emergent results were called by telephone at the time of interpretation on 04/14/2013 at 11:06 AM to NP [facility], who verbally acknowledged these results.   Electronically Signed   By: Trudie Reed M.D.   On: 04/14/2013 11:07   Dg Chest Port 1 View  04/14/2013   CLINICAL DATA:  Central line placement.  EXAM: PORTABLE CHEST - 1 VIEW  COMPARISON:  Chest x-ray 04/11/2013.  FINDINGS: There is a right-sided internal jugular central venous catheter with tip terminating in the distal superior vena cava. No pneumothorax. Patchy interstitial prominence throughout the lungs bilaterally, slightly asymmetric involving the left lung to a greater extent than the right, favored to reflect some asymmetric pulmonary edema. Pulmonary vasculature appears mildly engorged. Heart size is upper limits of normal. Upper mediastinal contours are within normal limits. No definite pleural effusions.  IMPRESSION: 1. Interval placement of right internal jugular central venous catheter with tip in the distal superior vena cava. 2. Diffuse interstitial prominence favored to reflect some mildly asymmetric pole interstitial pulmonary edema. Attention  on followups is recommended to exclude the possibility of recent aspiration.   Electronically Signed   By: Trudie Reed M.D.   On: 04/14/2013 15:51   Dg Or Local Abdomen  04/14/2013   CLINICAL DATA:  48 year old female with incorrect instrument count, Large scissors implicated. Initial encounter.  EXAM: OR LOCAL ABDOMEN  COMPARISON:  CT Abdomen and Pelvis 04/14/2013.  FINDINGS: Portable AP supine view at 1421 hrs. No radiopaque foreign body identified. Visualized bowel gas pattern is non obstructed. Stable visualized osseous structures.  IMPRESSION: No retained surgical or radiopaque foreign body identified in the lower abdomen or pelvis.  Study discussed by telephone with Provicer Jeanice Lim in the OR on 04/14/2013 at 14:26 .   Electronically Signed   By: Augusto Gamble M.D.   On: 04/14/2013 14:26      Assessment/Plan:   Active Problems:   Chest pain   ACS (acute coronary syndrome)   Intermediate coronary syndrome   Retroperitoneal bleed   Anemia due to blood loss   Hyperlipidemia LDL goal < 70   No recurrent chest pain. HR increased to 90's; will titrate carvedilol to 6.25 mg bid.  H/H 10/29.6. No  active bleeding. Keep off hep or lovenox for DVT prophylaxis. Ambulate as tolerates. Transfer to telemetry. PT consult.    Lennette Bihari, MD, [facility] 04/16/2013, 10:44 AM

## 2013-04-16 NOTE — Progress Notes (Signed)
PT Cancellation Note  Patient Details Name: Kerrah Windholz MRN: [id number] DOB: 1965/08/09   Cancelled Treatment:    Reason Eval Not Completed: RN reports pt just got back into bed after being up for breakfast (RN actually still in room getting pt settled). Asked PT to return later today.   Scherrie November Harvis Mabus 04/16/2013, 9:06 AM Pager [phone]

## 2013-04-17 ENCOUNTER — Inpatient Hospital Stay (HOSPITAL_COMMUNITY): Payer: BC Managed Care – PPO

## 2013-04-17 LAB — CBC
HCT: 27.9 % — ABNORMAL LOW (ref 36.0–46.0)
Hemoglobin: 9.6 g/dL — ABNORMAL LOW (ref 12.0–15.0)
MCH: 30.2 pg (ref 26.0–34.0)
MCHC: 34.4 g/dL (ref 30.0–36.0)
MCV: 87.7 fL (ref 78.0–100.0)
PLATELETS: 119 10*3/uL — AB (ref 150–400)
RBC: 3.18 MIL/uL — AB (ref 3.87–5.11)
RDW: 13.7 % (ref 11.5–15.5)
WBC: 6.5 10*3/uL (ref 4.0–10.5)

## 2013-04-17 LAB — HEMOGLOBIN AND HEMATOCRIT, BLOOD
HCT: 28.6 % — ABNORMAL LOW (ref 36.0–46.0)
HEMATOCRIT: 28.1 % — AB (ref 36.0–46.0)
HEMOGLOBIN: 9.5 g/dL — AB (ref 12.0–15.0)
HEMOGLOBIN: 9.4 g/dL — AB (ref 12.0–15.0)

## 2013-04-17 MED ORDER — SODIUM CHLORIDE 0.9 % IJ SOLN
10.0000 mL | INTRAMUSCULAR | Status: DC | PRN
  Administered 2013-04-17 (×2): 20 mL
  Administered 2013-04-17: 10 mL
  Administered 2013-04-18: 20 mL

## 2013-04-17 MED ORDER — FERROUS SULFATE 325 (65 FE) MG PO TABS
325.0000 mg | ORAL_TABLET | Freq: Two times a day (BID) | ORAL | Status: DC
  Administered 2013-04-17 – 2013-04-18 (×3): 325 mg via ORAL
  Filled 2013-04-17 (×5): qty 1

## 2013-04-17 MED ORDER — OXYCODONE-ACETAMINOPHEN 5-325 MG PO TABS
2.0000 | ORAL_TABLET | ORAL | Status: DC | PRN
  Administered 2013-04-17 – 2013-04-18 (×6): 2 via ORAL
  Filled 2013-04-17 (×6): qty 2

## 2013-04-17 MED ORDER — DOCUSATE SODIUM 50 MG PO CAPS
50.0000 mg | ORAL_CAPSULE | Freq: Two times a day (BID) | ORAL | Status: DC
  Administered 2013-04-17 – 2013-04-18 (×2): 50 mg via ORAL
  Filled 2013-04-17 (×4): qty 1

## 2013-04-17 NOTE — Progress Notes (Signed)
Patient Name: Lindsay Camacho Date of Encounter: 04/17/2013  Active Problems:   Chest pain   ACS (acute coronary syndrome)   Intermediate coronary syndrome   Retroperitoneal bleed   Anemia due to blood loss   Hyperlipidemia LDL goal < 70    Patient Profile: 48 yo female w/ hx CAD, HTN, LBBB, Tob, ICM, was admitted 04/10 w/ chest pain. Cath 04/13 w/ patent arteries, EF 50%. Right groin hematoma after cath w/ surgical repair.  SUBJECTIVE: 1. Severe pain from abd incision - needs more pain medication 2. SOB, wants home oxygen, says RN told her she would quit breathing at night 3. Wants a walker, says can't walk without one because of abd pain 4. Wants SW consult, says cannot do her job anymore because of chest pain.  5. No chest pain.  OBJECTIVE Filed Vitals:   04/16/13 2308 04/16/13 2309 04/17/13 0615 04/17/13 0752  BP: 85/60 83/59 96/65  110/79  Pulse:   87 80  Temp:   98.2 F (36.8 C)   TempSrc:   Oral   Resp:   20   Height:      Weight:   136 lb (61.689 kg)   SpO2:   95%     Intake/Output Summary (Last 24 hours) at 04/17/13 0808 Last data filed at 04/17/13 0013  Gross per 24 hour  Intake     20 ml  Output    300 ml  Net   -280 ml   Filed Weights   04/14/13 0553 04/16/13 1411 04/17/13 0615  Weight: 121 lb 14.6 oz (55.3 kg) 131 lb 12.8 oz (59.784 kg) 136 lb (61.689 kg)    PHYSICAL EXAM General: Well developed, well nourished, female in no acute distress. Head: Normocephalic, atraumatic.  Neck: Supple without bruits, JVD not elevated. Lungs:  Resp regular and unlabored, rales bases, good air exchange. Heart: RRR, S1, S2, no S3, S4, or murmur; no rub. Abdomen: Soft, non-tender, non-distended, BS + x 4.  Incision w/out infection or drainage, healing well Extremities: No clubbing, cyanosis, no edema.  Neuro: Alert and oriented X 3. Moves all extremities spontaneously. Psych: Normal affect.  LABS: CBC: Recent Labs  04/16/13 0300  04/16/13 2109 04/17/13 0256   WBC 7.3  --   --  6.5  HGB 10.0*  < > 9.6* 9.6*  HCT 29.6*  < > 28.4* 27.9*  MCV 88.1  --   --  87.7  PLT 117*  --   --  119*  < > = values in this interval not displayed.  Basic Metabolic Panel: Recent Labs  04/14/13 1251 04/15/13 0224  NA 138 138  K 3.8 3.6*  CL  --  104  CO2  --  24  GLUCOSE  --  79  BUN  --  5*  CREATININE  --  0.60  CALCIUM  --  7.8*   Lab Results  Component Value Date   TROPONINI <0.30 04/13/2013   Lab Results  Component Value Date   CHOL 191 01/20/2013   HDL 28* 01/20/2013   LDLCALC 140* 01/20/2013   TRIG 117 01/20/2013   CHOLHDL 6.8 01/20/2013    TELE:  SR, LBBB, PVCs  Current Medications:  . carvedilol  6.25 mg Oral BID WC  . docusate sodium  100 mg Oral Daily  . isosorbide mononitrate  30 mg Oral Daily  . nicotine  14 mg Transdermal Daily  . pantoprazole  40 mg Oral Daily  . rosuvastatin  20 mg Oral  QHS  . sodium chloride  3 mL Intravenous Q12H   . DOPamine      ASSESSMENT AND PLAN: Active Problems:   Chest pain - cath w/out obstructive disease, EF 50%. On BB, statin, Imdur. Add ASA when OK w/ surgery    ACS (acute coronary syndrome) - see above   Intermediate coronary syndrome - see above    Retroperitoneal bleed - s/p surgery, per [facility]; will ask them about pain meds.    Anemia due to blood loss - stable    Hyperlipidemia LDL goal < 70 - LDL elevated in January, will check in am    Hypokalemia - s/p supp, will recheck in am    Hypotension - SBP 90s at times.      SOB - ck sats w/ ambulation, recheck CXR, w/ reports of possible sleep apnea, may need sleep study.  Plan - d/c when medically ready.  Signed, Darrol Jumphonda G Barrett , PA-C 8:08 AM 04/17/2013  See my note pain meds adjusted by Dr Darrick PennaFields  Probable d/c in 48 hours  Wendall StadePeter C Lashawn Bromwell

## 2013-04-17 NOTE — Progress Notes (Signed)
Patient ID: Nimo Whillock, female   DOB: [birth year], 48 y.o.   MRN: [id number]   Subjective:  Has ambulated Complains of pain meds not working   Objective:   Vital Signs in the last 24 hours: Temp:  [98.1 F (36.7 C)-98.5 F (36.9 C)] 98.2 F (36.8 C) (04/16 0615) Pulse Rate:  [80-95] 80 (04/16 0752) Resp:  [18-20] 20 (04/16 0615) BP: (72-133)/(49-79) 110/79 mmHg (04/16 0752) SpO2:  [94 %-96 %] 95 % (04/16 0615) Weight:  [131 lb 12.8 oz (59.784 kg)-136 lb (61.689 kg)] 136 lb (61.689 kg) (04/16 0615)  Intake/Output from previous day: 04/15 0701 - 04/16 0700 In: 270 [P.O.:240; I.V.:30] Out: 300 [Urine:300]  Medications: . carvedilol  6.25 mg Oral BID WC  . docusate sodium  100 mg Oral Daily  . isosorbide mononitrate  30 mg Oral Daily  . nicotine  14 mg Transdermal Daily  . pantoprazole  40 mg Oral Daily  . rosuvastatin  20 mg Oral QHS  . sodium chloride  3 mL Intravenous Q12H    . DOPamine      Physical Exam:   General appearance: alert, appears older than stated age and no distress Neck: no adenopathy, no carotid bruit, no JVD, supple, symmetrical, trachea midline and thyroid not enlarged, symmetric, no tenderness/mass/nodules Lungs: decreased BS; no wheezing Heart: RRR 1/6 SEM Abdomen: soft, non-tender; bowel sounds normal; no masses,  no organomegaly; Tatoo RLQ  Pain to palpaton over surgical incision but wound looks good Plus 2 DP on right leg Extremities: no edema, redness or tenderness in the calves or thighs Skin: Skin color, texture, turgor normal. No rashes or lesions Neurologic: Grossly normal   Rate: 95  Rhythm: normal sinus rhythm  Lab Results:     Recent Labs  04/14/13 1251 04/15/13 0224  NA 138 138  K 3.8 3.6*  CL  --  104  CO2  --  24  GLUCOSE  --  79  BUN  --  5*  CREATININE  --  0.60   CBC    Component Value Date/Time   WBC 6.5 04/17/2013 0256   RBC 3.18* 04/17/2013 0256   HGB 9.6* 04/17/2013 0256   HCT 27.9* 04/17/2013 0256   PLT 119*  04/17/2013 0256   MCV 87.7 04/17/2013 0256   MCH 30.2 04/17/2013 0256   MCHC 34.4 04/17/2013 0256   RDW 13.7 04/17/2013 0256   LYMPHSABS 2.3 04/11/2013 0143   MONOABS 0.5 04/11/2013 0143   EOSABS 0.3 04/11/2013 0143   BASOSABS 0.0 04/11/2013 0143    No results found for this basename: TROPONINI, CK, MB,  in the last 72 hours Hepatic Function Panel No results found for this basename: PROT, ALBUMIN, AST, ALT, ALKPHOS, BILITOT, BILIDIR, IBILI,  in the last 72 hours No results found for this basename: INR,  in the last 72 hours BNP (last 3 results)  Recent Labs  01/17/13 1957  PROBNP 4026.0*    Lipid Panel     Component Value Date/Time   CHOL 191 01/20/2013 0930   TRIG 117 01/20/2013 0930   HDL 28* 01/20/2013 0930   CHOLHDL 6.8 01/20/2013 0930   VLDL 23 01/20/2013 0930   LDLCALC 140* 01/20/2013 0930      Imaging:  No results found.    Assessment/Plan:   Active Problems:   Chest pain   ACS (acute coronary syndrome)   Intermediate coronary syndrome   Retroperitoneal bleed   Anemia due to blood loss   Hyperlipidemia LDL goal < 70  Chest Pain:  No CAD at cath  D/C pending ambulation , and pain control of surgical site post retroperitoneal bleed from cath Hct stable add iron and stool softener  Dr Darrick PennaFields to adjust pain meds

## 2013-04-17 NOTE — Progress Notes (Signed)
[facility] [place]  Subjective  - Better but still has pain   Objective 110/79 80 98.2 F (36.8 C) (Oral) 20 95%  Intake/Output Summary (Last 24 hours) at 04/17/13 0837 Last data filed at 04/17/13 5391  Gross per 24 hour  Intake    260 ml  Output    300 ml  Net    -40 ml   Incisions healing 2+ PT pulse  Assessment/Planning: Slowly improving Adjust pain meds  Ambulate possibly home tomorrow or next day at latest  Sherren Kerns 04/17/2013 8:37 AM --  Laboratory Lab Results:  Recent Labs  04/16/13 0300  04/16/13 2109 04/17/13 0256  WBC 7.3  --   --  6.5  HGB 10.0*  < > 9.6* 9.6*  HCT 29.6*  < > 28.4* 27.9*  PLT 117*  --   --  119*  < > = values in this interval not displayed. BMET  Recent Labs  04/14/13 1251 04/15/13 0224  NA 138 138  K 3.8 3.6*  CL  --  104  CO2  --  24  GLUCOSE  --  79  BUN  --  5*  CREATININE  --  0.60  CALCIUM  --  7.8*    COAG Lab Results  Component Value Date   INR 0.98 04/11/2013   No results found for this basename: PTT

## 2013-04-17 NOTE — Evaluation (Signed)
Physical Therapy Evaluation Patient Details Name: Lindsay Camacho MRN: [id number] DOB: 24-Dec-1965 Today's Date: 04/17/2013   History of Present Illness  48 y.o. female admitted to [facility] on 04/11/13 with atypical chest pain. EKG with unchanged rythm (pt with h/o LBBB).  Cardiac cath preformed (04/14/13) and pt developed a post-op retroperitoneal bleed and had to undergo emergency Evacuation of right retroperitoneal hematoma, repair of right external iliac artery also done on 04/14/13).  Pt with significant cardiac PMHx including ACS, MI, LBBB, CVA, PVD, CAD, s/p cardiac cath with stenting in Feb 2015.    Clinical Impression  Pt is moving slowly due to pain, but with RW is moving mostly on her own power.  She will need a RW for home use, but I do not believe she will need f/u PT after d/c from acute hospital setting.  She will have 24/7 assist at home from daughter and cousin (who she reports is an Charity fundraiser).   PT to follow acutely for deficits listed below.       Follow Up Recommendations No PT follow up;Supervision - Intermittent    Equipment Recommendations  Rolling walker with 5" wheels    Recommendations for Other Services   NA    Precautions / Restrictions   none     Mobility  Bed Mobility Overal bed mobility: Modified Independent             General bed mobility comments: HOB elevated and reliance on rails.  Pt is slow to move due to pain  Transfers Overall transfer level: Modified independent Equipment used: Rolling walker (2 wheeled)             General transfer comment: again, slow to move, but on her own power  Ambulation/Gait Ambulation/Gait assistance: Supervision Ambulation Distance (Feet): 150 Feet Assistive device: Rolling walker (2 wheeled) Gait Pattern/deviations: Step-through pattern;Trunk flexed;Antalgic Gait velocity: decreased Gait velocity interpretation: <1.8 ft/sec, indicative of risk for recurrent falls General Gait Details: Pt is slow to move.  We  discussed continued attempts at more upright posture as she continue to walk.           Balance Overall balance assessment: Needs assistance Sitting-balance support: Feet supported Sitting balance-Leahy Scale: Good Sitting balance - Comments: able to doff socks seated EOB.    Standing balance support: Bilateral upper extremity supported Standing balance-Leahy Scale: Poor Standing balance comment: needs upper extremity support due to pain                             Pertinent Vitals/Pain See vitals flow sheet.     Home Living Family/patient expects to be discharged to:: Private residence Living Arrangements: Alone Available Help at Discharge: Family;Available 24 hours/day (daughter and cousin who is an Charity fundraiser) Type of Home: Apartment Home Access: Level entry     Home Layout: One level Home Equipment: None Additional Comments: Did not use O2 PTA    Prior Function Level of Independence: Independent                  Extremity/Trunk Assessment   Upper Extremity Assessment: Overall WFL for tasks assessed           Lower Extremity Assessment: Generalized weakness (limited by pain )      Cervical / Trunk Assessment: Normal  Communication   Communication: No difficulties  Cognition Arousal/Alertness: Awake/alert Behavior During Therapy: WFL for tasks assessed/performed Overall Cognitive Status: Within Functional Limits for tasks assessed  General Comments General comments (skin integrity, edema, etc.): O2 sats >96% on RA with gait.  HR stable as well.  DOE reported as 1/4.  Pt feels she needs home O2, but the numbers do not support this.  RN made aware          Assessment/Plan    PT Assessment Patient needs continued PT services  PT Diagnosis Difficulty walking;Abnormality of gait;Generalized weakness;Acute pain   PT Problem List Decreased strength;Decreased activity tolerance;Decreased balance;Decreased  mobility;Decreased knowledge of use of DME;Pain  PT Treatment Interventions DME instruction;Gait training;Functional mobility training;Therapeutic activities;Therapeutic exercise;Balance training;Neuromuscular re-education;Cognitive remediation;Patient/family education;Modalities   PT Goals (Current goals can be found in the Care Plan section) Acute Rehab PT Goals Patient Stated Goal: to decrease her pain and become more independent PT Goal Formulation: With patient Time For Goal Achievement: 05/01/13 Potential to Achieve Goals: Good    Frequency Min 3X/week        End of Session Equipment Utilized During Treatment: Gait belt Activity Tolerance: Patient limited by pain Patient left: in bed;with call bell/phone within reach Nurse Communication: Mobility status (left on RA after gait)        Time: [id number] PT Time Calculation (min): 28 min   Charges:   PT Evaluation $Initial PT Evaluation Tier I: 1 Procedure PT Treatments $Gait Training: 8-22 mins        Vir Whetstine B. Ricke Kimoto, PT, DPT [phone]   04/17/2013, 2:17 PM

## 2013-04-17 NOTE — Care Management Note (Addendum)
  Page 2 of 2   04/17/2013     12:39:14 PM   CARE MANAGEMENT NOTE 04/17/2013  Patient:  CESARIA, DEBACA   Account Number:  [id number]  Date Initiated:  04/14/2013  Documentation initiated by:  Junius Creamer  Subjective/Objective Assessment:   adm w ch pain     Action/Plan:   lives alone   Anticipated DC Date:     Anticipated DC Plan:  HOME/SELF CARE      DC Planning Services  CM consult      PAC Choice  DURABLE MEDICAL EQUIPMENT   Choice offered to / List presented to:  C-1 Patient   DME arranged  Levan Hurst      DME agency  [facility]        Status of service:  Completed, signed off Medicare Important Message given?   (If response is "NO", the following Medicare IM given date fields will be blank) Date Medicare IM given:   Date Additional Medicare IM given:    Discharge Disposition:  HOME/SELF CARE  Per UR Regulation:  Reviewed for med. necessity/level of care/duration of stay  If discussed at Long Length of Stay Meetings, dates discussed:   04/17/2013    Comments:  04-17-13 [address] [place] [phone] CM did speak to pt and she is agreeable to DME via [facility]. DME to be delivered to room before d/c. Cm also spoke to pt in reference to Food Stamps- pt will need to contact Social Services for assistance. CM also provided information in reference to Disability. No further needs from CM at this time.

## 2013-04-18 ENCOUNTER — Encounter: Payer: Self-pay | Admitting: Physician Assistant

## 2013-04-18 LAB — TYPE AND SCREEN
ABO/RH(D): A POS
Antibody Screen: NEGATIVE
UNIT DIVISION: 0
UNIT DIVISION: 0
UNIT DIVISION: 0
UNIT DIVISION: 0
Unit division: 0
Unit division: 0

## 2013-04-18 LAB — CBC
HCT: 28.5 % — ABNORMAL LOW (ref 36.0–46.0)
Hemoglobin: 9.5 g/dL — ABNORMAL LOW (ref 12.0–15.0)
MCH: 29.7 pg (ref 26.0–34.0)
MCHC: 33.3 g/dL (ref 30.0–36.0)
MCV: 89.1 fL (ref 78.0–100.0)
PLATELETS: 150 10*3/uL (ref 150–400)
RBC: 3.2 MIL/uL — ABNORMAL LOW (ref 3.87–5.11)
RDW: 13.6 % (ref 11.5–15.5)
WBC: 6.4 10*3/uL (ref 4.0–10.5)

## 2013-04-18 LAB — BASIC METABOLIC PANEL
BUN: 3 mg/dL — ABNORMAL LOW (ref 6–23)
CALCIUM: 8.4 mg/dL (ref 8.4–10.5)
CO2: 26 mEq/L (ref 19–32)
Chloride: 102 mEq/L (ref 96–112)
Creatinine, Ser: 0.63 mg/dL (ref 0.50–1.10)
GLUCOSE: 82 mg/dL (ref 70–99)
Potassium: 3.6 mEq/L — ABNORMAL LOW (ref 3.7–5.3)
SODIUM: 139 meq/L (ref 137–147)

## 2013-04-18 LAB — LIPID PANEL
CHOLESTEROL: 124 mg/dL (ref 0–200)
HDL: 26 mg/dL — ABNORMAL LOW (ref >39)
LDL CALC: 76 mg/dL (ref 0–99)
TRIGLYCERIDES: 111 mg/dL (ref <150)
Total CHOL/HDL Ratio: 4.8 RATIO
VLDL: 22 mg/dL (ref 0–40)

## 2013-04-18 MED ORDER — CARVEDILOL 3.125 MG PO TABS: 3.1250 mg | ORAL_TABLET | Freq: Two times a day (BID) | ORAL | Status: DC

## 2013-04-18 MED ORDER — FERROUS SULFATE 325 (65 FE) MG PO TABS: 325.0000 mg | ORAL_TABLET | Freq: Two times a day (BID) | ORAL | Status: DC

## 2013-04-18 MED ORDER — POTASSIUM CHLORIDE CRYS ER 20 MEQ PO TBCR
40.0000 meq | EXTENDED_RELEASE_TABLET | Freq: Once | ORAL | Status: AC
  Administered 2013-04-18: 40 meq via ORAL
  Filled 2013-04-18: qty 2

## 2013-04-18 MED ORDER — ISOSORBIDE MONONITRATE ER 30 MG PO TB24: 30.0000 mg | ORAL_TABLET | Freq: Every day | ORAL | Status: DC

## 2013-04-18 MED ORDER — NICOTINE 7 MG/24HR TD PT24: 7.0000 mg | MEDICATED_PATCH | Freq: Every day | TRANSDERMAL | Status: DC

## 2013-04-18 MED ORDER — DOCUSATE SODIUM 50 MG PO CAPS: 50.0000 mg | ORAL_CAPSULE | Freq: Two times a day (BID) | ORAL | Status: DC

## 2013-04-18 MED ORDER — OXYCODONE-ACETAMINOPHEN 5-325 MG PO TABS: 1.0000 | ORAL_TABLET | Freq: Four times a day (QID) | ORAL | Status: DC | PRN

## 2013-04-18 NOTE — Progress Notes (Signed)
Patient Name: Lindsay Camacho Date of Encounter: 04/18/2013  Active Problems:   Chest pain   ACS (acute coronary syndrome)   Intermediate coronary syndrome   Retroperitoneal bleed   Anemia due to blood loss   Hyperlipidemia LDL goal < 70    Patient Profile: 48 yo female w/ hx CAD, HTN, LBBB, Tob, ICM, was admitted 04/10 w/ chest pain. Cath 04/13 w/ patent arteries, EF 50%. Right groin hematoma after cath w/ surgical repair.   SUBJECTIVE: Pt feels ready to go today. Ambulating with a walker. No new complaints of pain.  OBJECTIVE Filed Vitals:   04/17/13 1420 04/17/13 1655 04/17/13 2100 04/18/13 0516  BP: 107/78 96/69 95/60  107/79  Pulse: 70 76 71 73  Temp: 97.5 F (36.4 C)  97.8 F (36.6 C) 97.8 F (36.6 C)  TempSrc: Oral   Oral  Resp: 19  16 18   Height:      Weight:    130 lb 6.4 oz (59.149 kg)  SpO2: 96%  100% 99%    Intake/Output Summary (Last 24 hours) at 04/18/13 0805 Last data filed at 04/17/13 2159  Gross per 24 hour  Intake    480 ml  Output    700 ml  Net   -220 ml   Filed Weights   04/16/13 1411 04/17/13 0615 04/18/13 0516  Weight: 131 lb 12.8 oz (59.784 kg) 136 lb (61.689 kg) 130 lb 6.4 oz (59.149 kg)    PHYSICAL EXAM General: Well developed, well nourished, female in no acute distress. Head: Normocephalic, atraumatic.  Neck: Supple without bruits, JVD not elevated. Lungs:  Resp regular and unlabored, good air exchange, few dry rales. Heart: RRR, S1, S2, no S3, S4, or murmur; no rub. Abdomen: Soft, non-tender, non-distended, BS + x 4.  Extremities: No clubbing, cyanosis, no edema.  Neuro: Alert and oriented X 3. Moves all extremities spontaneously. Psych: Normal affect.  LABS: CBC: Recent Labs  04/17/13 0256  04/17/13 2025 04/18/13 0505  WBC 6.5  --   --  6.4  HGB 9.6*  < > 9.4* 9.5*  HCT 27.9*  < > 28.1* 28.5*  MCV 87.7  --   --  89.1  PLT 119*  --   --  150  < > = values in this interval not displayed.  Basic Metabolic  Panel: Recent Labs  25/63/89 0505  NA 139  K 3.6*  CL 102  CO2 26  GLUCOSE 82  BUN 3*  CREATININE 0.63  CALCIUM 8.4   BNP: Pro B Natriuretic peptide (BNP)  Date/Time Value Ref Range Status  01/17/2013  7:57 PM 4026.0* 0 - 125 pg/mL Final   Fasting Lipid Panel: Recent Labs  04/18/13 0505  CHOL 124  HDL 26*  LDLCALC 76  TRIG 373  CHOLHDL 4.8    TELE:        Radiology/Studies: Dg Chest 2 View  04/17/2013   CLINICAL DATA:  Shortness of breath.  EXAM: CHEST  2 VIEW  COMPARISON:  04/14/2013 and 04/11/2013  FINDINGS: The heart size and pulmonary vascularity are normal. Pulmonary edema has resolved. There is new bibasilar linear atelectasis. Central venous catheter tip is in the superior vena cava in good position. No pneumothorax. No osseous abnormality.  IMPRESSION: New bibasilar atelectasis.  Resolution of pulmonary edema.   Electronically Signed   By: Geanie Cooley M.D.   On: 04/17/2013 10:14     Current Medications:  . carvedilol  6.25 mg Oral BID WC  . docusate  sodium  50 mg Oral BID  . ferrous sulfate  325 mg Oral BID WC  . isosorbide mononitrate  30 mg Oral Daily  . nicotine  14 mg Transdermal Daily  . pantoprazole  40 mg Oral Daily  . rosuvastatin  20 mg Oral QHS  . sodium chloride  3 mL Intravenous Q12H   . DOPamine      ASSESSMENT AND PLAN: Active Problems: Chest pain - cath w/out obstructive disease, EF 50%. On BB, statin, Imdur. Add ASA when OK w/ surgery   ACS (acute coronary syndrome) - see above  Intermediate coronary syndrome - see above   Retroperitoneal bleed - s/p surgery, per [facility]; will ask them about pain meds.   Anemia due to blood loss - stable   Hyperlipidemia LDL goal < 70 - LDL elevated in January, will check in am   Hypokalemia - will supp (again), may need as OP   Hypotension - SBP 90s at times. Coreg held last pm  SOB - sats OK w/ ambulation, CXR w/ ATX, incentive spirometry; w/ reports of possible sleep apnea, may need sleep  study.  Plan - d/c today  Signed, Darrol JumpRhonda G Barrett , PA-C 8:05 AM 04/18/2013  Patient examined chart reviewed see my note F/U Dr Darrick PennaFields already arranged  Wendall StadePeter C Vincente Asbridge

## 2013-04-18 NOTE — Progress Notes (Addendum)
[facility] Progress Note  04/18/2013 9:13 AM 4 Days Post-Op  Subjective:  Feeling much better  Afebrile VSS  Filed Vitals:   04/18/13 0826  BP: 109/81  Pulse: 81  Temp:   Resp:     Physical Exam: Incisions:  Both incisions are healing nicely Extremities:  + palpable DP/PT right  CBC    Component Value Date/Time   WBC 6.4 04/18/2013 0505   RBC 3.20* 04/18/2013 0505   HGB 9.5* 04/18/2013 0505   HCT 28.5* 04/18/2013 0505   PLT 150 04/18/2013 0505   MCV 89.1 04/18/2013 0505   MCH 29.7 04/18/2013 0505   MCHC 33.3 04/18/2013 0505   RDW 13.6 04/18/2013 0505   LYMPHSABS 2.3 04/11/2013 0143   MONOABS 0.5 04/11/2013 0143   EOSABS 0.3 04/11/2013 0143   BASOSABS 0.0 04/11/2013 0143    BMET    Component Value Date/Time   NA 139 04/18/2013 0505   K 3.6* 04/18/2013 0505   CL 102 04/18/2013 0505   CO2 26 04/18/2013 0505   GLUCOSE 82 04/18/2013 0505   BUN 3* 04/18/2013 0505   CREATININE 0.63 04/18/2013 0505   CALCIUM 8.4 04/18/2013 0505   GFRNONAA >90 04/18/2013 0505   GFRAA >90 04/18/2013 0505    INR    Component Value Date/Time   INR 0.98 04/11/2013 0143     Intake/Output Summary (Last 24 hours) at 04/18/13 0913 Last data filed at 04/17/13 2159  Gross per 24 hour  Intake    240 ml  Output    700 ml  Net   -460 ml     Assessment:  48 y.o. female is s/p:  Evacuation of right retroperitoneal hematoma, repair of right external iliac artery   4 Days Post-Op  Plan: -pain much better controlled today -being discharged home today.  She states the Percocet is working well.   -Would prescribe 5/325 mg 1-2 tabs po q6h prn pain #30 no refill. -she has a f/u appt with Dr. Darrick Penna on 05/01/13 -explained to the pt importance of keeping groin incision clean and dry to help prevent infection.  She expressed understanding.   Doreatha Massed, PA-C [facility] [phone] 04/18/2013 9:13 AM    Incisions healing Less pain Going home today  Fabienne Bruns, MD [facility] [place] Office: [phone] Pager: [phone]

## 2013-04-18 NOTE — Discharge Instructions (Signed)
Wash the groin wound with soap and water daily and pat dry. (No tub bath-only shower)  Then put a dry gauze or washcloth there to keep this area dry daily and as needed.  Do not use Vaseline or neosporin on your incisions.  Only use soap and water on your incisions and then protect and keep dry.  This is to help prevent infection of wound.  NO TOBACCO

## 2013-04-18 NOTE — Discharge Summary (Signed)
CARDIOLOGY DISCHARGE SUMMARY   Patient ID: Lindsay [name] MRN: [id number] DOB/AGE: 48-01-1965 48 y.o.  Admit date: 04/11/2013 Discharge date: 04/18/2013  PCP: No PCP Per Patient Primary Cardiologist: Dr. Eldridge DaceVaranasi  Primary Discharge Diagnosis:    ACS (acute coronary syndrome) - Medical therapy for CAD Secondary Discharge Diagnosis:    Chest pain   Intermediate coronary syndrome   Retroperitoneal bleed - large extraperitoneal hematoma   Anemia due to blood loss   Hyperlipidemia LDL goal < 70   Consults: VVS, Case Manager  Procedures: Lexiscan MV, Left heart catheterization with selective coronary angiography, left ventriculogram, abd Xray, CT abd/pelvis,  Evacuation of right retroperitoneal hematoma, repair of right external iliac artery  Hospital Course: Lindsay [name] is a 48 y.o. female with a history of CAD. She was initially thought to be a STEMI but her ECG has a left bundle branch block that was unchanged and a STEMI was canceled. Her symptoms improved with nitroglycerin but returned. She was admitted for further evaluation and treatment.  Her cardiac enzymes were negative for MI. A stress test was performed on 04/11/2013, results are below. It was significantly abnormal and showed left ventricular dysfunction as well. She was continued on medical therapy and cardiac catheterization was scheduled. She was taken to the cath lab on 4/13.  Cardiac catheterization results are below. She had no critical lesions and no PCI was indicated. Medical therapy was recommended with emphasis on cardiac risk factor reduction. She had Coreg added to her medication regimen which was increased to 6.25 mg twice a day. However, her systolic blood pressure was frequently in the 90s and doses were held. She will be discharged on Coreg 3.125 mg twice a day. She had Imdur 30 mg daily added to her medication regimen and tolerated this well.  Smoking cessation was discussed and encouraged. She was started on  a nicotine patch. A lipid profile was drawn and results are below. Her LDL is under good control but her HDL is still low. She had problems with Lipitor so was changed to Crestor. Currently she is tolerating the Crestor well.   After the heart catheterization, she developed nausea, abdominal pain and became hypotensive. Rapid response was called to assess the patient. She had a CT of the abdomen and pelvis which showed a large extraperitoneal hematoma. A surgical consult was called.  She was seen by Dr. Darrick PennaFields and surgery was recommended. She was taken to the OR on 4/13. She had a large right retroperitoneal hematoma on the anterior abdominal wall, hole in right external iliac artery adjacent to laceration of right circumflex iliac branch. The hematoma was evacuated and the right external iliac artery was repaired successfully. She tolerated the procedure well, but continued to have pain in her abdomen. This was treated medically and she gradually improved. She was followed by Dr. Darrick PennaFields during her hospital stay and he will see her as an outpatient. His recommendations on pain control medications will be followed.  She was anemic after the procedure and she was felt to have acute blood loss anemia. Her MCV was within normal limits. Her hemoglobin stabilized after the surgery and this can be followed as an outpatient. She was placed on oral iron supplementation.  She was seen by physical therapy and a rolling walker was recommended and ordered. She will use this at home until she gets stronger. She also wondered about disability and case manager saw her to discuss that process. She will pursue this as an outpatient.  She complained of shortness of breath no chest x-ray was performed which showed atelectasis. She was given incentive spirometry and her respiratory status improved. She does not qualify for home oxygen. She had mild hypokalemia at times which was supplemented. She is not on a diuretic.  Hopefully this will improve when she is off IV fluids.  On 4/17, Lindsay Camacho was able to increase her ambulation. She lives with family and has 24/7 help available to her. She is medically stable for discharge, to follow up as an outpatient.  Labs:   Lab Results  Component Value Date   WBC 6.4 04/18/2013   HGB 9.5* 04/18/2013   HCT 28.5* 04/18/2013   MCV 89.1 04/18/2013   PLT 150 04/18/2013     Recent Labs Lab 04/18/13 0505  NA 139  K 3.6*  CL 102  CO2 26  BUN 3*  CREATININE 0.63  CALCIUM 8.4  GLUCOSE 82   Lipid Panel     Component Value Date/Time   CHOL 124 04/18/2013 0505   TRIG 111 04/18/2013 0505   HDL 26* 04/18/2013 0505   CHOLHDL 4.8 04/18/2013 0505   VLDL 22 04/18/2013 0505   LDLCALC 76 04/18/2013 0505   Lab Results  Component Value Date   TROPONINI <0.30 04/13/2013   Radiology: Ct Abdomen Pelvis Wo Contrast 04/14/2013   CLINICAL DATA:  Status post catheterization. Right growing and right lower quadrant pain. Evaluate for hemorrhage.  EXAM: CT ABDOMEN AND PELVIS WITHOUT CONTRAST  TECHNIQUE: Multidetector CT imaging of the abdomen and pelvis was performed following the standard protocol without intravenous contrast.  COMPARISON:  No priors.  FINDINGS: Lung Bases: Minimal bibasilar subsegmental atelectasis.  Abdomen/Pelvis: There is a large collection of extraperitoneal high attenuation material in the right hemipelvis which measures at least 10.4 x 9.1 x 8.3 cm. Findings are compatible with acute hemorrhage. This appears intimately associated with the posterior aspect of the lower right rectus abdominis musculature, but extends along the right pelvic sidewall posteriorly as well. This exerts significant mass effect displacing adjacent structures from the right side of the pelvis. There may be a very small volume of hemoperitoneum at this time, although this is not definite.  The appearance of the liver, gallbladder, pancreas, spleen, bilateral adrenal glands and bilateral kidneys is  unremarkable. Extensive atherosclerosis throughout the abdominal and pelvic vasculature, without evidence of aneurysm. No significant volume of ascites. No pneumoperitoneum. No pathologic distention of small bowel. Foley balloon catheter is present within the lumen of the urinary bladder which is completely decompressed. Notably, the urinary bladder is significantly displaced into the left side of the pelvis related to mass effect of the patient's large right-sided hemorrhage.  Musculoskeletal: There are no aggressive appearing lytic or blastic lesions noted in the visualized portions of the skeleton.  IMPRESSION: 1. Large extraperitoneal hemorrhage centered in the right hemipelvis, with likely origin from the lower aspect of the right rectus abdominis musculature. There may be a very small volume of hemoperitoneum at this time, however, this is not definite. 2. 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 3. Additional incidental findings, as above. Critical Value/emergent results were called by telephone at the time of interpretation on 04/14/2013 at 11:06 AM to NP [facility], who verbally acknowledged these results.   Electronically Signed   By: Trudie Reed M.D.   On: 04/14/2013 11:07   Dg Chest 2 View 04/17/2013   CLINICAL DATA:  Shortness of breath.  EXAM: CHEST  2 VIEW  COMPARISON:  04/14/2013 and 04/11/2013  FINDINGS: The heart size and pulmonary vascularity are normal. Pulmonary edema has resolved. There is new bibasilar linear atelectasis. Central venous catheter tip is in the superior vena cava in good position. No pneumothorax. No osseous abnormality.  IMPRESSION: New bibasilar atelectasis.  Resolution of pulmonary edema.   Electronically Signed   By: Geanie Cooley M.D.   On: 04/17/2013 10:14   Dg Chest 2 View 04/11/2013   CLINICAL DATA:  chest pain  EXAM: CHEST  2 VIEW  COMPARISON:  DG CHEST 1V PORT dated 01/17/2013  FINDINGS: Cardiac silhouette within normal  limits. Very mild area of increased density is appreciated within the right lung base primarily on the frontal view. There is also mild prominence of the interstitial markings. No focal regions of consolidation. Osseous structures are unremarkable.  IMPRESSION: Interstitial prominence may reflect sequela of bronchitis.  Mild atelectasis versus small focal infiltrate right lung base.   Electronically Signed   By: Salome Holmes M.D.   On: 04/11/2013 13:16   Nm Myocar Multi W/spect W/wall Motion / Ef 04/11/2013   CLINICAL DATA:  Khalyn Lab is a 48 yo who ws admitted to the hospital with chest pain. She was scheduled for a Lexiscan Myoview for further evaluation. The patient has a left bundle branch block at baseline.  EXAM: MYOCARDIAL IMAGING WITH SPECT (REST AND PHARMACOLOGIC-STRESS)  GATED LEFT VENTRICULAR WALL MOTION STUDY  LEFT VENTRICULAR EJECTION FRACTION  TECHNIQUE: Standard myocardial SPECT imaging was performed after resting intravenous injection of 10 mCi Tc-54m sestamibi. Subsequently, intravenous infusion of Lexiscan was performed under the supervision of the Cardiology staff. At peak effect of the drug, 30 mCi Tc-59m sestamibi was injected intravenously and standard myocardial SPECT imaging was performed. Quantitative gated imaging was also performed to evaluate left ventricular wall motion, and estimate left ventricular ejection fraction.  COMPARISON:  None.  FINDINGS: The raw data images reveal minimal motion artifact. There is increased uptake in structures below the diaphragm which may effect the interpretation of the study. The stress Myoview images reveal a small area of severe attenuation in the mid/distal anterior wall toward the apex. There is also a small, severe defect in the inferolateral basal segment.  The resting Myoview images reveals a very small, mild to moderate defect in the distal anterior and anteroapical walls and no significant defect in the inferolateral basal segment. This is  consistent with the previous anteroapical infarction with a small amount of peri-infarct ischemia and evidence of inferolateral basal ischemia.  The quantitative gated SPECT images reveals an end-diastolic volume of 100 mL. The end-systolic volume is 64 mL. This is an ejection fraction of 36%. The left ventricle systolic function is globally depressed. There is perhaps a little bit more significant hypokinesis involving the lateral basal segments as well as the anterior wall.  IMPRESSION: This is interpreted as an high risk Myoview study. 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 There is also evidence of inferior lateral basal ischemia. The left ventricular systolic function is moderately reduced with a left ventricular EF of 36%.   Electronically Signed   By: Kristeen Miss   On: 04/11/2013 16:16   Dg Chest Port 1 View 04/14/2013   CLINICAL DATA:  Central line placement.  EXAM: PORTABLE CHEST - 1 VIEW  COMPARISON:  Chest x-ray 04/11/2013.  FINDINGS: There is a right-sided internal jugular central venous catheter with tip terminating in the distal superior vena cava. No pneumothorax. Patchy  interstitial prominence throughout the lungs bilaterally, slightly asymmetric involving the left lung to a greater extent than the right, favored to reflect some asymmetric pulmonary edema. Pulmonary vasculature appears mildly engorged. Heart size is upper limits of normal. Upper mediastinal contours are within normal limits. No definite pleural effusions.  IMPRESSION: 1. Interval placement of right internal jugular central venous catheter with tip in the distal superior vena cava. 2. Diffuse interstitial prominence favored to reflect some mildly asymmetric pole interstitial pulmonary edema. Attention on followups is recommended to exclude the possibility of recent aspiration.   Electronically Signed   By: Trudie Reed M.D.   On: 04/14/2013 15:51   Dg Or Local  Abdomen 04/14/2013   CLINICAL DATA:  48 year old female with incorrect instrument count, Large scissors implicated. Initial encounter.  EXAM: OR LOCAL ABDOMEN  COMPARISON:  CT Abdomen and Pelvis 04/14/2013.  FINDINGS: Portable AP supine view at 1421 hrs. No radiopaque foreign body identified. Visualized bowel gas pattern is non obstructed. Stable visualized osseous structures.  IMPRESSION: No retained surgical or radiopaque foreign body identified in the lower abdomen or pelvis.  Study discussed by telephone with Provicer Jeanice Lim in the OR on 04/14/2013 at 14:26 .   Electronically Signed   By: Augusto Gamble M.D.   On: 04/14/2013 14:26    Cardiac Cath: 04/14/2013 ANGIOGRAPHIC DATA: The left main coronary artery is widely patent.  The left anterior descending artery is a large vessel. There is mild atherosclerosis in the mid vessel. The first 2 diagonals are medium sized and widely patent. The third diagonal is large and widely patent.  The left circumflex artery is a large vessel. The proximal to mid stent is widely patent. There is a large first obtuse marginal which is widely patent..  The right coronary artery is a large dominant vessel. Proximally, there is mild atherosclerosis. The posterior descending is medium size. The posterior lateral artery a small. Both branches are patent.Marland Kitchen  LEFT VENTRICULOGRAM: Left ventricular angiogram was done in the 30 RAO projection and revealed normal left ventricular wall motion and systolic function with an estimated ejection fraction of 50%. LVEDP was 18 mmHg.  FEMORAL ANGIOGRAM: This revealed a 50-60% eccentric stenosis, in the proximal right common femoral artery. The femoral entry site appears to be right at the pelvic brim. Will hold pressure for an extra few minutes during the groin hold.  IMPRESSIONS:  1. Normal left main coronary artery. 2. Widely patent left anterior descending artery and its branches. 3. Widely patent stent in the proximal left circumflex  artery. Widely patent branches. 4. Mild atherosclerosis in the proximal right coronary artery. 5. Normal left ventricular systolic function. LVEDP 18 mmHg. Ejection fraction 50%. RECOMMENDATION: Continue aggressive secondary prevention. We stressed the importance of smoking cessation. She will need to continue dual antiplatelet therapy.  EKG: 04/14/2013 Sinus rhythm, left bundle branch block Vent. rate 65 BPM PR interval 112 ms QRS duration 142 ms QT/QTc 480/499 ms P-R-T axes 9 8 102  Echo: 04/13/2013 Conclusions - Left ventricle: The cavity size was normal. Wall thickness was normal. Systolic function was normal. The estimated ejection fraction was in the range of 50% to 55%. Possible hypokinesis of the inferior myocardium. Features are consistent with a pseudonormal left ventricular filling pattern, with concomitant abnormal relaxation and increased filling pressure (grade 2 diastolic dysfunction). - Ventricular septum: Septal motion showed abnormal function and dyssynergy. These changes are consistent with a left bundle branch block. - Aortic valve: Mild regurgitation. - Mitral valve: Mild regurgitation.  FOLLOW UP PLANS AND APPOINTMENTS Allergies  Allergen Reactions  . Brilinta [Ticagrelor]     Dyspnea  . Other Other (See Comments)    BP med ?amaldemac. BP bottomed out  . Penicillins Rash     Medication List    STOP taking these medications       atorvastatin 80 MG tablet  Commonly known as:  LIPITOR      TAKE these medications       aspirin 81 MG tablet  Take 1 tablet (81 mg total) by mouth daily.     carvedilol 3.125 MG tablet  Commonly known as:  COREG  Take 1 tablet (3.125 mg total) by mouth 2 (two) times daily with a meal.     clopidogrel 75 MG tablet  Commonly known as:  PLAVIX  Take 1 tablet (75 mg total) by mouth daily with breakfast.     docusate sodium 50 MG capsule  Commonly known as:  COLACE  Take 1 capsule (50 mg total) by mouth 2 (two)  times daily.     ferrous sulfate 325 (65 FE) MG tablet  Take 1 tablet (325 mg total) by mouth 2 (two) times daily with a meal.     isosorbide mononitrate 30 MG 24 hr tablet  Commonly known as:  IMDUR  Take 1 tablet (30 mg total) by mouth daily.     nicotine 7 mg/24hr patch  Commonly known as:  NICODERM CQ - dosed in mg/24 hr  Place 1 patch (7 mg total) onto the skin daily.     nitroGLYCERIN 0.4 MG SL tablet  Commonly known as:  NITROSTAT  Place 1 tablet (0.4 mg total) under the tongue every 5 (five) minutes x 3 doses as needed for chest pain.     oxyCODONE-acetaminophen 5-325 MG per tablet  Commonly known as:  PERCOCET/ROXICET  Take 1-2 tablets by mouth every 6 (six) hours as needed for severe pain.     rosuvastatin 10 MG tablet  Commonly known as:  CRESTOR  1 tablet daily and if you cant tolerate 10 mg, then reduce to 5 mg daily.       Discharge Orders   Future Appointments Provider Department Dept Phone   05/01/2013 10:30 AM Sherren Kerns, MD [facility] [phone]   05/05/2013 3:40 PM Kennon Rounds [facility] [employer] Office [phone]   05/27/2013 8:20 AM [facility] [place] Office [phone]   05/27/2013 11:15 AM Corky Crafts, MD [facility] [phone]   Future Orders Complete By Expires   Diet - low sodium heart healthy  As directed    Increase activity slowly  As directed      Follow-up Information   Follow up with Sherren Kerns, MD On 05/01/2013. (at 10:30 am)    Specialty:  Vascular Surgery   Contact information:   [address] [place] [address] [phone]       Follow up with [facility]. Adult nurse)    Contact information:   [address] [place] [address] [phone]       Follow up with Tereso Newcomer, PA-C On 05/05/2013. (See for Dr. Eldridge Dace at 3:40 pm)    Specialty:  Physician Assistant   Contact information:   [address] [place] [address] [phone]       BRING ALL MEDICATIONS WITH YOU TO FOLLOW UP APPOINTMENTS  Time spent with patient to  include physician time: 48 min Signed: Darrol Jump, PA-C 04/18/2013, 10:06 AM Co-Sign MD

## 2013-04-21 ENCOUNTER — Other Ambulatory Visit: Payer: Self-pay | Admitting: *Deleted

## 2013-04-21 MED ORDER — ROSUVASTATIN CALCIUM 10 MG PO TABS: ORAL_TABLET | ORAL | Status: DC

## 2013-04-24 ENCOUNTER — Telehealth: Payer: Self-pay

## 2013-04-24 DIAGNOSIS — G8918 Other acute postprocedural pain: Secondary | ICD-10-CM

## 2013-04-24 MED ORDER — OXYCODONE-ACETAMINOPHEN 10-325 MG PO TABS: ORAL_TABLET | ORAL | Status: DC

## 2013-04-24 NOTE — Telephone Encounter (Signed)
Phone call from pt.  States she is not getting relief from the Percocet 5/325 mg. Tabs.  Reports she is taking 1-2 tablets q 4 hrs/ prn.  Rates her pain at 8/10.  C/o muscle spasms in legs and pain in right groin incision.  Reports that the right groin site is bruised, and there is hard area on right side of incision.  Denies any increase in swelling.  Denies any drainage from the right groin incision.  Denies fever; states she has had some cold sweats @ night.  Discussed with Dr. Darrick Penna; recommended Percocet 10/325 mg 1-2 tabs q 4-6 hr/ prn;#40;no refills.  Notified pt. of prescription to be picked up at office.  Enc. to keep f/u appt. W/ Dr. Darrick Penna on 05/01/13.  Verb. understanding.

## 2013-04-25 NOTE — Progress Notes (Signed)
Patient ID: Lindsay Camacho, female   DOB: June 14, 1965, 48 y.o.   MRN: [id number] Placed samples back in closet after two weeks,  the samples were placed up front on 04/09/13.

## 2013-04-30 ENCOUNTER — Encounter: Payer: Self-pay | Admitting: Vascular Surgery

## 2013-04-30 ENCOUNTER — Telehealth: Payer: Self-pay | Admitting: Interventional Cardiology

## 2013-04-30 NOTE — Telephone Encounter (Signed)
Per Dr. Eldridge Dace stop coreg.

## 2013-04-30 NOTE — Telephone Encounter (Signed)
New message      Blood pressure is really low---75-61 and pt is feeling weak. Please advise

## 2013-04-30 NOTE — Telephone Encounter (Signed)
Pt notified. Meds updated. Pt has appt on 05/05/13.

## 2013-05-01 ENCOUNTER — Encounter: Payer: Self-pay | Admitting: Vascular Surgery

## 2013-05-01 ENCOUNTER — Ambulatory Visit (INDEPENDENT_AMBULATORY_CARE_PROVIDER_SITE_OTHER): Payer: BC Managed Care – PPO | Admitting: Vascular Surgery

## 2013-05-01 VITALS — BP 96/63 | HR 70 | Temp 97.8°F | Ht 62.0 in | Wt 130.0 lb

## 2013-05-01 DIAGNOSIS — K661 Hemoperitoneum: Secondary | ICD-10-CM | POA: Insufficient documentation

## 2013-05-01 DIAGNOSIS — R58 Hemorrhage, not elsewhere classified: Secondary | ICD-10-CM

## 2013-05-01 NOTE — Progress Notes (Signed)
Patient is a 48 year old female returns for followup today. She had evacuation of a right retroperitoneal hematoma and repair of a femoral pseudoaneurysm approximately 2 weeks ago after cardiac catheterization. She still complains peri ncisional pain. Patient also complains of low blood pressure one measuring her left arm. She denies numbness tingling or aching in her left hand.  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 If she develops symptoms of left arm exertional fatigue or ulcerations on her fingers we will consider intervention for her left subclavian stenosis. No further intervention planned for her right leg. Patient may return to work next week. No restrictions. No further pain medication.  Fabienne Bruns, MD [facility] [place] Office: [phone] Pager: [phone]

## 2013-05-02 ENCOUNTER — Emergency Department (HOSPITAL_COMMUNITY)
Admission: EM | Admit: 2013-05-02 | Discharge: 2013-05-02 | Disposition: A | Discharge status: Home / Self Care | Payer: BC Managed Care – PPO | Attending: Emergency Medicine | Admitting: Emergency Medicine

## 2013-05-02 ENCOUNTER — Encounter (HOSPITAL_COMMUNITY): Payer: Self-pay | Admitting: Emergency Medicine

## 2013-05-02 ENCOUNTER — Emergency Department (HOSPITAL_COMMUNITY): Payer: BC Managed Care – PPO

## 2013-05-02 ENCOUNTER — Telehealth (HOSPITAL_COMMUNITY): Payer: Self-pay | Admitting: Cardiac Rehabilitation

## 2013-05-02 DIAGNOSIS — Z7982 Long term (current) use of aspirin: Secondary | ICD-10-CM | POA: Insufficient documentation

## 2013-05-02 DIAGNOSIS — Z880 Allergy status to penicillin: Secondary | ICD-10-CM | POA: Insufficient documentation

## 2013-05-02 DIAGNOSIS — I10 Essential (primary) hypertension: Secondary | ICD-10-CM | POA: Insufficient documentation

## 2013-05-02 DIAGNOSIS — F17200 Nicotine dependence, unspecified, uncomplicated: Secondary | ICD-10-CM | POA: Insufficient documentation

## 2013-05-02 DIAGNOSIS — I2510 Atherosclerotic heart disease of native coronary artery without angina pectoris: Secondary | ICD-10-CM | POA: Insufficient documentation

## 2013-05-02 DIAGNOSIS — R071 Chest pain on breathing: Secondary | ICD-10-CM | POA: Insufficient documentation

## 2013-05-02 DIAGNOSIS — E785 Hyperlipidemia, unspecified: Secondary | ICD-10-CM | POA: Insufficient documentation

## 2013-05-02 DIAGNOSIS — I252 Old myocardial infarction: Secondary | ICD-10-CM | POA: Insufficient documentation

## 2013-05-02 DIAGNOSIS — Z8673 Personal history of transient ischemic attack (TIA), and cerebral infarction without residual deficits: Secondary | ICD-10-CM | POA: Insufficient documentation

## 2013-05-02 DIAGNOSIS — Z9861 Coronary angioplasty status: Secondary | ICD-10-CM | POA: Insufficient documentation

## 2013-05-02 DIAGNOSIS — Z79899 Other long term (current) drug therapy: Secondary | ICD-10-CM | POA: Insufficient documentation

## 2013-05-02 DIAGNOSIS — Z9889 Other specified postprocedural states: Secondary | ICD-10-CM | POA: Insufficient documentation

## 2013-05-02 DIAGNOSIS — R0602 Shortness of breath: Secondary | ICD-10-CM | POA: Insufficient documentation

## 2013-05-02 DIAGNOSIS — R0789 Other chest pain: Secondary | ICD-10-CM

## 2013-05-02 DIAGNOSIS — Z7902 Long term (current) use of antithrombotics/antiplatelets: Secondary | ICD-10-CM | POA: Insufficient documentation

## 2013-05-02 LAB — CBC
HEMATOCRIT: 37.1 % (ref 36.0–46.0)
Hemoglobin: 12.6 g/dL (ref 12.0–15.0)
MCH: 30 pg (ref 26.0–34.0)
MCHC: 34 g/dL (ref 30.0–36.0)
MCV: 88.3 fL (ref 78.0–100.0)
PLATELETS: 374 10*3/uL (ref 150–400)
RBC: 4.2 MIL/uL (ref 3.87–5.11)
RDW: 13.5 % (ref 11.5–15.5)
WBC: 7.9 10*3/uL (ref 4.0–10.5)

## 2013-05-02 LAB — URINALYSIS, ROUTINE W REFLEX MICROSCOPIC
BILIRUBIN URINE: NEGATIVE
Glucose, UA: NEGATIVE mg/dL
HGB URINE DIPSTICK: NEGATIVE
Ketones, ur: NEGATIVE mg/dL
Leukocytes, UA: NEGATIVE
Nitrite: NEGATIVE
PH: 8.5 — AB (ref 5.0–8.0)
PROTEIN: NEGATIVE mg/dL
Specific Gravity, Urine: 1.009 (ref 1.005–1.030)
Urobilinogen, UA: 1 mg/dL (ref 0.0–1.0)

## 2013-05-02 LAB — COMPREHENSIVE METABOLIC PANEL
ALT: 14 U/L (ref 0–35)
AST: 15 U/L (ref 0–37)
Albumin: 3.2 g/dL — ABNORMAL LOW (ref 3.5–5.2)
Alkaline Phosphatase: 111 U/L (ref 39–117)
BILIRUBIN TOTAL: 0.5 mg/dL (ref 0.3–1.2)
BUN: 6 mg/dL (ref 6–23)
CALCIUM: 9.4 mg/dL (ref 8.4–10.5)
CHLORIDE: 104 meq/L (ref 96–112)
CO2: 23 meq/L (ref 19–32)
Creatinine, Ser: 0.64 mg/dL (ref 0.50–1.10)
GFR calc Af Amer: >90 mL/min (ref >90)
Glucose, Bld: 94 mg/dL (ref 70–99)
Potassium: 4.1 mEq/L (ref 3.7–5.3)
Sodium: 142 mEq/L (ref 137–147)
Total Protein: 7.7 g/dL (ref 6.0–8.3)

## 2013-05-02 LAB — PROTIME-INR
INR: 1.02 (ref 0.00–1.49)
PROTHROMBIN TIME: 13.2 s (ref 11.6–15.2)

## 2013-05-02 LAB — I-STAT TROPONIN, ED: Troponin i, poc: 0 ng/mL (ref 0.00–0.08)

## 2013-05-02 LAB — TROPONIN I

## 2013-05-02 NOTE — Telephone Encounter (Signed)
pc to pt to enroll in cardiac rehab.  Pt tearful c/o dyspnea, productive sputum, white phelgm and orthpnea.  Pt reports she is home alone, has no transportation and is fearful. Pt instructed to go to emergency room for evaluation.  Called EMS for pt and stayed on phone with her until they arrived.  Pt emergency contact Cora Daniels made aware and verbalized understanding.

## 2013-05-02 NOTE — ED Provider Notes (Signed)
CSN: [id number]     Arrival date & time 05/02/13  1731 History   First MD Initiated Contact with Patient 05/02/13 1815     Chief Complaint  Patient presents with  . Chest Pain  . Shortness of Breath     HPI 48 yo female from home via EMS.  Patient had recent admission for what was thought to be coronary artery disease.  She had a cardiac catheterization which showed a nonsignificant coronary angiogram with patent stents and no significant disease.  Medical management was elected.  Following this she had a retroperitoneal bleed complication that was repaired. Pain was 10/10 with palpation to chest wall.  Patient states that she has been short of breath and having chest discomfort but it is no worse than it was when she left the hospital.  It was documented that she had both of these when she left.  She was contacted at home and a followup from the hospital she told them she was having chest pain shortness breath and they called 911.  The patient's symptoms have not really changed from the time she left the hospital. EMS 324 ASA and 250 cc Bolus. Intial BP 114/86 post fluid 132/88, HR 80 sat 98-100% 2L. Recent heart cath. Has abdomen incision that is approximated  Past Medical History  Diagnosis Date  . Hypertension   . Stroke     tia with no deficits  . CAD (coronary artery disease) 11/13/2011    50% ? proximal LCx stenosis per Cath at [facility]  . PVD (peripheral vascular disease)     Right CFA stenosis per report on 11/14/2011  . HLD (hyperlipidemia)   . LBBB (left bundle branch block)   . Acute myocardial infarction of other lateral wall, initial episode of care 01/2013    DES CFX  . Intermediate coronary syndrome    Past Surgical History  Procedure Laterality Date  . Cardiac catheterization  04/14/2013    Non-obstructive disease, patent CFX stent  . Tubal ligation    . Appendectomy    . Groin debridement Right 04/14/2013    Procedure: Emergency Evacuation of Retroperitoneal Hematoma and  Repair Right External Iliac Artery Pseudoaneurysm    ;  Surgeon: Sherren Kerns, MD;  Location: [facility] OR;  Service: Vascular;  Laterality: Right;  . Coronary angioplasty with stent placement  01/17/2013    mild disease except 99% CFX, rx with  2.5 x 28 Alpine drug-eluting stent    Family History  Problem Relation Age of Onset  . CAD Mother 3  . Lung cancer Mother   . Bladder Cancer Mother   . Stroke Mother   . CAD Father 36   History  Substance Use Topics  . Smoking status: Current Every Day Smoker -- 0.50 packs/day  . Smokeless tobacco: Never Used  . Alcohol Use: No   OB History   Grav Para Term Preterm Abortions TAB SAB Ect Mult Living                 Review of Systems  All other systems reviewed and are negative  Allergies  Brilinta; Other; and Penicillins  Home Medications   Prior to Admission medications   Medication Sig Start Date End Date Taking? Authorizing Provider  aspirin 81 MG tablet Take 1 tablet (81 mg total) by mouth daily. 10/08/12  Yes Christiane Ha, MD  clopidogrel (PLAVIX) 75 MG tablet Take 1 tablet (75 mg total) by mouth daily with breakfast. 01/20/13  Yes Eda Paschal  Kilroy, PA-C  docusate sodium (COLACE) 50 MG capsule Take 1 capsule (50 mg total) by mouth 2 (two) times daily. 04/18/13  Yes Rhonda G Barrett, PA-C  ferrous sulfate 325 (65 FE) MG tablet Take 1 tablet (325 mg total) by mouth 2 (two) times daily with a meal. 04/18/13  Yes Rhonda G Barrett, PA-C  isosorbide mononitrate (IMDUR) 30 MG 24 hr tablet Take 1 tablet (30 mg total) by mouth daily. 04/18/13  Yes Rhonda G Barrett, PA-C  nicotine (NICODERM CQ - DOSED IN MG/24 HR) 7 mg/24hr patch Place 1 patch (7 mg total) onto the skin daily. 04/18/13  Yes Rhonda G Barrett, PA-C  oxyCODONE-acetaminophen (PERCOCET) 10-325 MG per tablet Take 1-2 tabs every 4-6 hrs. Prn/ pain 04/24/13  Yes Sherren Kernsharles E Fields, MD  rosuvastatin (CRESTOR) 10 MG tablet 1 tablet daily and if you cant tolerate 10 mg, then reduce to 5 mg  daily. 04/21/13  Yes Corky CraftsJayadeep S Varanasi, MD  nitroGLYCERIN (NITROSTAT) 0.4 MG SL tablet Place 1 tablet (0.4 mg total) under the tongue every 5 (five) minutes x 3 doses as needed for chest pain. 01/20/13   Luke K Kilroy, PA-C   BP 143/79  Pulse 77  Temp(Src) 98.1 F (36.7 C) (Oral)  Resp 18  Ht 5\' 2"  (1.575 m)  Wt 130 lb (58.968 kg)  BMI 23.77 kg/m2  SpO2 100% Physical Exam  Nursing note and vitals reviewed. Constitutional: She is oriented to person, place, and time. She appears well-developed and well-nourished. No distress.  HENT:  Head: Normocephalic and atraumatic.  Eyes: Pupils are equal, round, and reactive to light.  Neck: Normal range of motion.  Cardiovascular: Normal rate and intact distal pulses.   Pulmonary/Chest: No respiratory distress. She has no wheezes.    Name reproducible to palpation  Abdominal: Normal appearance. She exhibits no distension. There is no tenderness. There is no rebound.  Musculoskeletal: Normal range of motion.  Neurological: She is alert and oriented to person, place, and time. No cranial nerve deficit.  Skin: Skin is warm and dry. No rash noted.  Psychiatric: She has a normal mood and affect. Her behavior is normal.    ED Course  Procedures (including critical care time) Labs Review Labs Reviewed  COMPREHENSIVE METABOLIC PANEL - Abnormal; Notable for the following:    Albumin 3.2 (*)    All other components within normal limits  URINALYSIS, ROUTINE W REFLEX MICROSCOPIC - Abnormal; Notable for the following:    pH 8.5 (*)    All other components within normal limits  CBC  PROTIME-INR  TROPONIN I  I-STAT TROPOININ, ED    Imaging Review No results found.   EKG Interpretation   Date/Time:  Friday May 02 2013 17:57:48 EDT Ventricular Rate:  74 PR Interval:  127 QRS Duration: 149 QT Interval:  463 QTC Calculation: 514 R Axis:   13 Text Interpretation:  Sinus rhythm Left bundle branch block No significant  change since last  tracing Confirmed by Nakeeta Sebastiani  MD, Annabeth Tortora ([id number]) on  05/02/2013 6:28:05 PM     Discuss with cardiology who felt patient was unlikely to have chest pain secondary to significant coronary disease.  Most likely chest wall pain. MDM   Final diagnoses:  Chest wall pain        Nelia Shiobert L Billijo Dilling, MD 05/14/13 1331

## 2013-05-02 NOTE — Discharge Instructions (Signed)

## 2013-05-02 NOTE — ED Notes (Addendum)
48 yo female from home via EMS with c/o SOB 32/40 bpm, CHX pressure. Hx of abdomenal hemorrage was d/c on Monday. Pain was 10/10 with palpation. EMS 324 ASA and 250 cc Bolus. Intial BP 114/86 post fluid 132/88, HR 80 sat 98-100% 2L. Recent heart cath. Has abdomen incision that is approximated, red, warm to touch. No fever present.

## 2013-05-05 ENCOUNTER — Encounter: Payer: Self-pay | Admitting: Physician Assistant

## 2013-05-05 ENCOUNTER — Encounter: Payer: Self-pay | Admitting: *Deleted

## 2013-05-05 ENCOUNTER — Ambulatory Visit (INDEPENDENT_AMBULATORY_CARE_PROVIDER_SITE_OTHER): Payer: BC Managed Care – PPO | Admitting: Physician Assistant

## 2013-05-05 VITALS — BP 110/68 | HR 87 | Ht 62.0 in | Wt 123.8 lb

## 2013-05-05 DIAGNOSIS — I10 Essential (primary) hypertension: Secondary | ICD-10-CM

## 2013-05-05 DIAGNOSIS — I2510 Atherosclerotic heart disease of native coronary artery without angina pectoris: Secondary | ICD-10-CM

## 2013-05-05 DIAGNOSIS — F32A Depression, unspecified: Secondary | ICD-10-CM

## 2013-05-05 DIAGNOSIS — D500 Iron deficiency anemia secondary to blood loss (chronic): Secondary | ICD-10-CM

## 2013-05-05 DIAGNOSIS — I255 Ischemic cardiomyopathy: Secondary | ICD-10-CM

## 2013-05-05 DIAGNOSIS — I2589 Other forms of chronic ischemic heart disease: Secondary | ICD-10-CM

## 2013-05-05 DIAGNOSIS — Z720 Tobacco use: Secondary | ICD-10-CM

## 2013-05-05 DIAGNOSIS — F329 Major depressive disorder, single episode, unspecified: Secondary | ICD-10-CM

## 2013-05-05 DIAGNOSIS — F3289 Other specified depressive episodes: Secondary | ICD-10-CM

## 2013-05-05 DIAGNOSIS — F17200 Nicotine dependence, unspecified, uncomplicated: Secondary | ICD-10-CM

## 2013-05-05 DIAGNOSIS — R58 Hemorrhage, not elsewhere classified: Secondary | ICD-10-CM

## 2013-05-05 NOTE — Progress Notes (Signed)
[address] [place], [place]  [address] Phone: [phone] Fax:  [phone]  Date:  05/05/2013   ID:  Lindsay Camacho, DOB 10/18/1965, MRN [id number]  PCP:  Provider in [place] (does not know name)  Cardiologist:  Dr. Everette Rank      History of Present Illness: Lindsay Camacho is a 48 y.o. female with a history of CAD status post non-STEMI in 01/2013 treated with DES to the circumflex, LBBB, ischemic cardiomyopathy with an EF of 40-45%, HL, tobacco abuse. She was admitted 4/10 on 4/17 with chest pain.  MI was ruled out.  Myoview was performed as an inpatient and this was abnormal.  Cardiac catheterization was complicated by large right retroperitoneal hematoma. She was seen by vascular surgery and underwent evacuation of retroperitoneal hematoma and repair of right external iliac artery by Dr. Darrick Penna.  She saw Dr. Darrick Penna in f/u last week.  She presents to the office today for follow up.  She is tearful at times.  She admits to depression and panic attacks. She has chest pain with anxiety.  She notes chronic DOE.  No syncope.  She gets lightheaded sometimes.  She had to stop Lisinopril and Coreg due to low BP.  She sleeps on 1-2 pillows.  She denies edema.  Of note, she appears to be depressed.  She often cries and states "I don't have any fight left anymore."  She has thoughts of death but denies suicidal ideations.  She has a poor appetite, anhedonia and frequent nausea.  Studies:  - LHC (04/14/13):  Proximal to mid circumflex stent patent, EF 50%, proximal right CFA 50-60%.  - Echo (04/13/13):  EF 50-55%, possible inferior HK, Grade 2 diastolic dysfunction, mild AI, mild MR  - Nuclear (04/11/13):  Anteroapical infarct with peri-infarct ischemia, inf lateral basal ischemia, EF 36%. - high risk   Recent Labs: 10/08/2012: TSH 2.912  01/17/2013: Pro B Natriuretic peptide (BNP) 4026.0*  04/18/2013: HDL Cholesterol 26*; LDL (calc) 76  05/02/2013: ALT 14; Creatinine 0.64; Hemoglobin 12.6;  Potassium 4.1   Wt Readings from Last 3 Encounters:  05/05/13 123 lb 12.8 oz (56.155 kg)  05/02/13 130 lb (58.968 kg)  05/01/13 130 lb (58.968 kg)     Past Medical History  Diagnosis Date  . Hypertension   . Stroke     tia with no deficits  . CAD (coronary artery disease) 11/13/2011    50% ? proximal LCx stenosis per Cath at [facility]  . PVD (peripheral vascular disease)     Right CFA stenosis per report on 11/14/2011  . HLD (hyperlipidemia)   . LBBB (left bundle branch block)   . Acute myocardial infarction of other lateral wall, initial episode of care 01/2013    DES CFX  . Intermediate coronary syndrome     Current Outpatient Prescriptions  Medication Sig Dispense Refill  . aspirin 81 MG tablet Take 1 tablet (81 mg total) by mouth daily.  30 tablet    . clopidogrel (PLAVIX) 75 MG tablet Take 1 tablet (75 mg total) by mouth daily with breakfast.  90 tablet  3  . docusate sodium (COLACE) 50 MG capsule Take 1 capsule (50 mg total) by mouth 2 (two) times daily.    0  . ferrous sulfate 325 (65 FE) MG tablet Take 1 tablet (325 mg total) by mouth 2 (two) times daily with a meal.  60 tablet  3  . isosorbide mononitrate (IMDUR) 30 MG 24 hr tablet Take 1 tablet (30  mg total) by mouth daily.  30 tablet  11  . nicotine (NICODERM CQ - DOSED IN MG/24 HR) 7 mg/24hr patch Place 1 patch (7 mg total) onto the skin daily.  14 patch  0  . nitroGLYCERIN (NITROSTAT) 0.4 MG SL tablet Place 1 tablet (0.4 mg total) under the tongue every 5 (five) minutes x 3 doses as needed for chest pain.  25 tablet  2  . oxyCODONE-acetaminophen (PERCOCET) 10-325 MG per tablet Take 1-2 tabs every 4-6 hrs. Prn/ pain  40 tablet  0  . rosuvastatin (CRESTOR) 10 MG tablet 1 tablet daily and if you cant tolerate 10 mg, then reduce to 5 mg daily.  30 tablet  1   No current facility-administered medications for this visit.    Allergies:   Brilinta; Other; and Penicillins   Social History:  The patient  reports that she has  been smoking.  She has never used smokeless tobacco. She reports that she uses illicit drugs (Marijuana). She reports that she does not drink alcohol.   Family History:  The patient's family history includes Bladder Cancer in her mother; CAD (age of onset: 74) in her mother; CAD (age of onset: 19) in her father; Lung cancer in her mother; Stroke in her mother.   ROS:  Please see the history of present illness.   She admits crying spells, poor appetite and frequent nausea.  She denies suicidal ideations.   All other systems reviewed and negative.   PHYSICAL EXAM: VS:  BP 110/68  Pulse 87  Ht 5\' 2"  (1.575 m)  Wt 123 lb 12.8 oz (56.155 kg)  BMI 22.64 kg/m2 Well nourished, well developed, in no acute distress HEENT: normal Neck: no JVD Cardiac:  normal S1, S2; RRR; no murmur Lungs:  clear to auscultation bilaterally, no wheezing, rhonchi or rales Abd: soft, nontender, no hepatomegaly Ext: no edemaR groin incision with some areas with poor healing but no drainage or redness;  She is very tender to very light touch but no obvious abnormality Skin: warm and dry Neuro:  CNs 2-12 intact, no focal abnormalities noted  EKG:  NSR, HR 90, LBBB     ASSESSMENT AND PLAN:  1. CAD (coronary artery disease):  She is s/p DES to CFX in 01/2013 for NSTEMI.  The CFX stent was patent by recent LHC.  She has been intol to Coreg and ACEI due to low BP.  Continue statin, ASA, Plavix. 2. HTN (hypertension):  Controlled.  Will need to eventually try to get back on beta blocker, ACEI. 3. Cardiomyopathy, ischemic:  EF recovered by recent echo.  Consider adding beta blocker and ACEI back if BP will tolerate, over time. 4. Tobacco abuse:  She is trying to quit. 5. Retroperitoneal bleed, s/p Repair of R External Iliac:  She remains quite tender.  There is an area in her R groin incision that is slow to heal but does not look infected.  I have asked her to keep an eye on this.  If it is draining anything or if her pain  does not improve, she should return to see Dr. Darrick Penna.  I did explain to her that she will likely have pain for some time.  I reassured her today.  She works in a factory and stands a lot.  I did give her a note to remain out of work for one more week.  6. Anemia due to blood loss:  Recent Hgb near normal.  She can stop her Iron. 7.  Depression:  She has significant problems with anxiety and depression.  I spent > 20 minutes counseling the patient today.  She agrees to see her PCP tomorrow (she can walk in) for depression.  8. Disposition:  F/u with Dr. Everette RankJay Varanasi as planned.  Signed, Tereso NewcomerScott Elbia Paro, PA-C  05/05/2013 4:17 PM

## 2013-05-05 NOTE — Patient Instructions (Addendum)
KEEP YOUR FOLLOW UP WITH DR. VARANASI  D/C IRON SUPPLEMENT  CALL DR. FIELDS IF YOUR PAIN IS NO BETTER BY THE END OF THIS WEEK  YOU WILL NEED TO SEE YOUR PRIMARY CARE TOMORROW ABOUT DEPRESSION  YOU HAVE BEEN GIVEN A WORK NOTE TODAY

## 2013-05-08 ENCOUNTER — Telehealth: Payer: Self-pay | Admitting: *Deleted

## 2013-05-08 NOTE — Telephone Encounter (Signed)
Patient called in to report that she has had 2 dime-sized areas on her groin incision which have "white tops with red areas around it" x 3 days. She said Tereso Newcomer saw her on 05-05-13 and said the incision was not infected just slow to heal. She has been afebrile and has not had any active bleeding or drainage. She has not been wearing any underwear or clothing that may have rubbed the incision. I spoke to Dr. Darrick Penna who wants to see her next Thursday. She will call us back if she has any fever, erythema or drainage from this wound; we will work her in with our Freight forwarder. I instructed the patient on proper incision care and she voiced agreement and understanding of this plan.

## 2013-05-13 ENCOUNTER — Telehealth (HOSPITAL_COMMUNITY): Payer: Self-pay | Admitting: Cardiac Rehabilitation

## 2013-05-13 NOTE — Telephone Encounter (Signed)
Spoke to pt about enrolling in cardiac rehab program.  Pt declined cardiac rehab due to transportation and work schedule conflicts.  Pt states she is currently walking on her own at home.

## 2013-05-14 ENCOUNTER — Encounter: Payer: Self-pay | Admitting: Vascular Surgery

## 2013-05-14 ENCOUNTER — Encounter (HOSPITAL_COMMUNITY): Payer: Self-pay | Admitting: Emergency Medicine

## 2013-05-14 ENCOUNTER — Telehealth: Payer: Self-pay

## 2013-05-14 ENCOUNTER — Emergency Department (HOSPITAL_COMMUNITY)
Admission: EM | Admit: 2013-05-14 | Discharge: 2013-05-14 | Disposition: A | Discharge status: Home / Self Care | Payer: BC Managed Care – PPO | Attending: Emergency Medicine | Admitting: Emergency Medicine

## 2013-05-14 DIAGNOSIS — Z7982 Long term (current) use of aspirin: Secondary | ICD-10-CM | POA: Insufficient documentation

## 2013-05-14 DIAGNOSIS — Z880 Allergy status to penicillin: Secondary | ICD-10-CM | POA: Insufficient documentation

## 2013-05-14 DIAGNOSIS — Z9861 Coronary angioplasty status: Secondary | ICD-10-CM | POA: Insufficient documentation

## 2013-05-14 DIAGNOSIS — I2510 Atherosclerotic heart disease of native coronary artery without angina pectoris: Secondary | ICD-10-CM | POA: Insufficient documentation

## 2013-05-14 DIAGNOSIS — Z79899 Other long term (current) drug therapy: Secondary | ICD-10-CM | POA: Insufficient documentation

## 2013-05-14 DIAGNOSIS — T8140XA Infection following a procedure, unspecified, initial encounter: Secondary | ICD-10-CM | POA: Insufficient documentation

## 2013-05-14 DIAGNOSIS — Z9889 Other specified postprocedural states: Secondary | ICD-10-CM | POA: Insufficient documentation

## 2013-05-14 DIAGNOSIS — I10 Essential (primary) hypertension: Secondary | ICD-10-CM | POA: Insufficient documentation

## 2013-05-14 DIAGNOSIS — Y840 Cardiac catheterization as the cause of abnormal reaction of the patient, or of later complication, without mention of misadventure at the time of the procedure: Secondary | ICD-10-CM | POA: Insufficient documentation

## 2013-05-14 DIAGNOSIS — Z7902 Long term (current) use of antithrombotics/antiplatelets: Secondary | ICD-10-CM | POA: Insufficient documentation

## 2013-05-14 DIAGNOSIS — T8149XA Infection following a procedure, other surgical site, initial encounter: Secondary | ICD-10-CM

## 2013-05-14 DIAGNOSIS — I252 Old myocardial infarction: Secondary | ICD-10-CM | POA: Insufficient documentation

## 2013-05-14 DIAGNOSIS — Z8673 Personal history of transient ischemic attack (TIA), and cerebral infarction without residual deficits: Secondary | ICD-10-CM | POA: Insufficient documentation

## 2013-05-14 DIAGNOSIS — E785 Hyperlipidemia, unspecified: Secondary | ICD-10-CM | POA: Insufficient documentation

## 2013-05-14 DIAGNOSIS — F17200 Nicotine dependence, unspecified, uncomplicated: Secondary | ICD-10-CM | POA: Insufficient documentation

## 2013-05-14 LAB — CBC WITH DIFFERENTIAL/PLATELET
BASOS PCT: 0 % (ref 0–1)
Basophils Absolute: 0 10*3/uL (ref 0.0–0.1)
EOS PCT: 6 % — AB (ref 0–5)
Eosinophils Absolute: 0.4 10*3/uL (ref 0.0–0.7)
HEMATOCRIT: 36.1 % (ref 36.0–46.0)
HEMOGLOBIN: 11.8 g/dL — AB (ref 12.0–15.0)
Lymphocytes Relative: 25 % (ref 12–46)
Lymphs Abs: 1.7 10*3/uL (ref 0.7–4.0)
MCH: 29.2 pg (ref 26.0–34.0)
MCHC: 32.7 g/dL (ref 30.0–36.0)
MCV: 89.4 fL (ref 78.0–100.0)
MONO ABS: 0.5 10*3/uL (ref 0.1–1.0)
Monocytes Relative: 7 % (ref 3–12)
Neutro Abs: 4.2 10*3/uL (ref 1.7–7.7)
Neutrophils Relative %: 62 % (ref 43–77)
Platelets: 227 10*3/uL (ref 150–400)
RBC: 4.04 MIL/uL (ref 3.87–5.11)
RDW: 15.2 % (ref 11.5–15.5)
WBC: 6.9 10*3/uL (ref 4.0–10.5)

## 2013-05-14 LAB — BASIC METABOLIC PANEL
BUN: 15 mg/dL (ref 6–23)
CALCIUM: 9.2 mg/dL (ref 8.4–10.5)
CO2: 25 mEq/L (ref 19–32)
CREATININE: 0.67 mg/dL (ref 0.50–1.10)
Chloride: 104 mEq/L (ref 96–112)
GFR calc non Af Amer: >90 mL/min (ref >90)
Glucose, Bld: 104 mg/dL — ABNORMAL HIGH (ref 70–99)
Potassium: 4.2 mEq/L (ref 3.7–5.3)
Sodium: 142 mEq/L (ref 137–147)

## 2013-05-14 MED ORDER — BACITRACIN ZINC 500 UNIT/GM EX OINT: 1.0000 "application " | TOPICAL_OINTMENT | Freq: Two times a day (BID) | CUTANEOUS | Status: DC

## 2013-05-14 NOTE — ED Provider Notes (Signed)
CSN: [id number]     Arrival date & time 05/14/13  1620 History   First MD Initiated Contact with Patient 05/14/13 1624     Chief Complaint  Patient presents with  . Wound Infection      HPI Per EMS: Patient coming from home. Reports have cardiac cath 3 weeks ago. States the went through her right groin for the cath. States the areas the catheter was inserted in is not infected. Pt ax4, NAD  Past Medical History  Diagnosis Date  . Hypertension   . Stroke     tia with no deficits  . CAD (coronary artery disease) 11/13/2011    50% ? proximal LCx stenosis per Cath at [facility]  . PVD (peripheral vascular disease)     Right CFA stenosis per report on 11/14/2011  . HLD (hyperlipidemia)   . LBBB (left bundle branch block)   . Acute myocardial infarction of other lateral wall, initial episode of care 01/2013    DES CFX  . Intermediate coronary syndrome    Past Surgical History  Procedure Laterality Date  . Cardiac catheterization  04/14/2013    Non-obstructive disease, patent CFX stent  . Tubal ligation    . Appendectomy    . Groin debridement Right 04/14/2013    Procedure: Emergency Evacuation of Retroperitoneal Hematoma and Repair Right External Iliac Artery Pseudoaneurysm    ;  Surgeon: Sherren Kerns, MD;  Location: [facility] OR;  Service: Vascular;  Laterality: Right;  . Coronary angioplasty with stent placement  01/17/2013    mild disease except 99% CFX, rx with  2.5 x 28 Alpine drug-eluting stent    Family History  Problem Relation Age of Onset  . CAD Mother 34  . Lung cancer Mother   . Bladder Cancer Mother   . Stroke Mother   . CAD Father 44   History  Substance Use Topics  . Smoking status: Current Every Day Smoker -- 0.50 packs/day  . Smokeless tobacco: Never Used  . Alcohol Use: No   OB History   Grav Para Term Preterm Abortions TAB SAB Ect Mult Living                 Review of Systems  All other systems reviewed and are negative.     Allergies  Brilinta;  Other; and Penicillins  Home Medications   Prior to Admission medications   Medication Sig Start Date End Date Taking? Authorizing Provider  acetaminophen (TYLENOL) 325 MG tablet Take 325 mg by mouth every 6 (six) hours as needed for moderate pain.   Yes Historical Provider, MD  aspirin 81 MG tablet Take 1 tablet (81 mg total) by mouth daily. 10/08/12  Yes Christiane Ha, MD  clopidogrel (PLAVIX) 75 MG tablet Take 1 tablet (75 mg total) by mouth daily with breakfast. 01/20/13  Yes Abelino Derrick, PA-C  docusate sodium (COLACE) 100 MG capsule Take 100 mg by mouth daily.   Yes Historical Provider, MD  isosorbide mononitrate (IMDUR) 30 MG 24 hr tablet Take 1 tablet (30 mg total) by mouth daily. 04/18/13  Yes Rhonda G Barrett, PA-C  oxyCODONE-acetaminophen (PERCOCET) 10-325 MG per tablet Take 1-2 tabs every 4-6 hrs. Prn/ pain 04/24/13  Yes Sherren Kerns, MD  rosuvastatin (CRESTOR) 10 MG tablet Take 10 mg by mouth daily.   Yes Historical Provider, MD  nitroGLYCERIN (NITROSTAT) 0.4 MG SL tablet Place 1 tablet (0.4 mg total) under the tongue every 5 (five) minutes x 3 doses as needed  for chest pain. 01/20/13   Luke K Kilroy, PA-C   BP 141/63  Pulse 97  Temp(Src) 97.9 F (36.6 C) (Oral)  Resp 17  SpO2 100% Physical Exam  Nursing note and vitals reviewed. Constitutional: She is oriented to person, place, and time. She appears well-developed and well-nourished. No distress.  HENT:  Head: Normocephalic and atraumatic.  Eyes: Pupils are equal, round, and reactive to light.  Neck: Normal range of motion.  Cardiovascular: Normal rate and intact distal pulses.   Pulmonary/Chest: No respiratory distress.  Abdominal: Normal appearance. She exhibits no distension.  Musculoskeletal:       Legs: Neurological: She is alert and oriented to person, place, and time. No cranial nerve deficit.  Skin: Skin is warm and dry. No rash noted.  Psychiatric: She has a normal mood and affect. Her behavior is  normal.    ED Course  Procedures (including critical care time) Labs Review Labs Reviewed  CBC WITH DIFFERENTIAL - Abnormal; Notable for the following:    Hemoglobin 11.8 (*)    Eosinophils Relative 6 (*)    All other components within normal limits  BASIC METABOLIC PANEL - Abnormal; Notable for the following:    Glucose, Bld 104 (*)    All other components within normal limits    Imaging Review No results found.   EKG Interpretation None      MDM   Final diagnoses:  Superficial postoperative wound infection        Nelia Shiobert L Jatoya Armbrister, MD 05/14/13 1756

## 2013-05-14 NOTE — ED Notes (Signed)
Per EMS: Patient coming from home. Reports have cardiac cath 3 weeks ago. States the went through her right groin for the cath. States the areas the catheter was inserted in is not infected. Pt ax4, NAD.

## 2013-05-14 NOTE — Telephone Encounter (Signed)
Phone call from pt.  Reported she has a "dime-size open area right groin"; stated draining a "creamy drainage".  Stated she has had constant pain for 3 days.  Stated the pain radiates from the right groin to the right knee, and around to the back of the leg.  C/o chills.  Pt. crying.  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  Discussed with Dr. Edilia Bo.   Recommends to schedule pt. Appt; advised pt. Should come to the clinic right away.  Notified pt. Of need to come to office right away.  Stated she will work on trying to get a ride to the office. Advised pt. She will need to be at the office by 4:00 PM.  Verb. Understanding.

## 2013-05-14 NOTE — Telephone Encounter (Signed)
Phone call from pt. Stated she cannot get a ride to the office this afternoon.  Advised pt. she should go to the ER due to the symptoms she is having.  Verb. Understanding.

## 2013-05-15 ENCOUNTER — Ambulatory Visit: Payer: BC Managed Care – PPO | Admitting: Vascular Surgery

## 2013-05-15 ENCOUNTER — Ambulatory Visit (INDEPENDENT_AMBULATORY_CARE_PROVIDER_SITE_OTHER): Payer: BC Managed Care – PPO | Admitting: Vascular Surgery

## 2013-05-15 ENCOUNTER — Encounter: Payer: Self-pay | Admitting: Vascular Surgery

## 2013-05-15 VITALS — BP 111/81 | HR 93 | Temp 97.7°F | Ht 62.0 in | Wt 124.0 lb

## 2013-05-15 DIAGNOSIS — Z0279 Encounter for issue of other medical certificate: Secondary | ICD-10-CM

## 2013-05-15 DIAGNOSIS — R58 Hemorrhage, not elsewhere classified: Secondary | ICD-10-CM

## 2013-05-15 DIAGNOSIS — K661 Hemoperitoneum: Secondary | ICD-10-CM

## 2013-05-15 DIAGNOSIS — G8918 Other acute postprocedural pain: Secondary | ICD-10-CM

## 2013-05-15 MED ORDER — OXYCODONE-ACETAMINOPHEN 5-325 MG PO TABS: 1.0000 | ORAL_TABLET | ORAL | Status: DC | PRN

## 2013-05-15 MED ORDER — OXYCODONE-ACETAMINOPHEN 10-325 MG PO TABS: ORAL_TABLET | ORAL | Status: DC

## 2013-05-15 NOTE — Progress Notes (Signed)
Patient is a 48 year old female returns for followup today. She had evacuation of a right retroperitoneal hematoma and repair of a femoral pseudoaneurysm approximately 2 weeks ago after cardiac catheterization. She still complains peri-incisional pain.   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 She still complains of abdominal pain and right lower quadrant pain and right groin pain. I discussed with her that she is one month out from her operation and should not really be requiring narcotic pain medication at this point. I did write her a prescription today for Percocet 5/325 #40 dispensed. I also made a contract with her today that she will not get any further narcotic pain medication after 2 weeks.  Fabienne Bruns, MD [facility] Office: [phone] Pager: [phone]

## 2013-05-23 ENCOUNTER — Encounter: Payer: Self-pay | Admitting: Family

## 2013-05-27 ENCOUNTER — Ambulatory Visit (INDEPENDENT_AMBULATORY_CARE_PROVIDER_SITE_OTHER): Payer: BC Managed Care – PPO | Admitting: Family

## 2013-05-27 ENCOUNTER — Encounter: Payer: Self-pay | Admitting: Family

## 2013-05-27 ENCOUNTER — Encounter: Payer: Self-pay | Admitting: Interventional Cardiology

## 2013-05-27 ENCOUNTER — Other Ambulatory Visit (INDEPENDENT_AMBULATORY_CARE_PROVIDER_SITE_OTHER): Payer: BC Managed Care – PPO

## 2013-05-27 ENCOUNTER — Ambulatory Visit (INDEPENDENT_AMBULATORY_CARE_PROVIDER_SITE_OTHER): Payer: BC Managed Care – PPO | Admitting: Interventional Cardiology

## 2013-05-27 VITALS — BP 134/87 | HR 79 | Resp 16 | Ht 62.0 in | Wt 130.0 lb

## 2013-05-27 VITALS — BP 114/100 | HR 70 | Ht 62.0 in | Wt 130.0 lb

## 2013-05-27 DIAGNOSIS — I2510 Atherosclerotic heart disease of native coronary artery without angina pectoris: Secondary | ICD-10-CM

## 2013-05-27 DIAGNOSIS — G8918 Other acute postprocedural pain: Secondary | ICD-10-CM

## 2013-05-27 DIAGNOSIS — R58 Hemorrhage, not elsewhere classified: Secondary | ICD-10-CM

## 2013-05-27 DIAGNOSIS — T148XXA Other injury of unspecified body region, initial encounter: Secondary | ICD-10-CM | POA: Insufficient documentation

## 2013-05-27 DIAGNOSIS — I10 Essential (primary) hypertension: Secondary | ICD-10-CM

## 2013-05-27 DIAGNOSIS — F17200 Nicotine dependence, unspecified, uncomplicated: Secondary | ICD-10-CM

## 2013-05-27 DIAGNOSIS — Z720 Tobacco use: Secondary | ICD-10-CM

## 2013-05-27 DIAGNOSIS — L24A9 Irritant contact dermatitis due friction or contact with other specified body fluids: Secondary | ICD-10-CM

## 2013-05-27 LAB — HEPATIC FUNCTION PANEL
ALT: 12 U/L (ref 0–35)
AST: 18 U/L (ref 0–37)
Albumin: 3.3 g/dL — ABNORMAL LOW (ref 3.5–5.2)
Alkaline Phosphatase: 66 U/L (ref 39–117)
BILIRUBIN DIRECT: 0 mg/dL (ref 0.0–0.3)
BILIRUBIN TOTAL: 0.4 mg/dL (ref 0.2–1.2)
Total Protein: 6.5 g/dL (ref 6.0–8.3)

## 2013-05-27 LAB — LIPID PANEL
Cholesterol: 140 mg/dL (ref 0–200)
HDL: 38.5 mg/dL — ABNORMAL LOW (ref >39.00)
LDL Cholesterol: 77 mg/dL (ref 0–99)
Total CHOL/HDL Ratio: 4
Triglycerides: 121 mg/dL (ref 0.0–149.0)
VLDL: 24.2 mg/dL (ref 0.0–40.0)

## 2013-05-27 MED ORDER — TRAMADOL HCL 50 MG PO TABS: 50.0000 mg | ORAL_TABLET | Freq: Four times a day (QID) | ORAL | Status: DC | PRN

## 2013-05-27 NOTE — Patient Instructions (Signed)
Your physician recommends that you continue on your current medications as directed. Please refer to the Current Medication list given to you today.  Your physician wants you to follow-up in: 6 months with Dr. Varanasi.  You will receive a reminder letter in the mail two months in advance. If you don't receive a letter, please call our office to schedule the follow-up appointment.  

## 2013-05-27 NOTE — Patient Instructions (Signed)
Smoking Cessation Quitting smoking is important to your health and has many advantages. However, it is not always easy to quit since nicotine is a very addictive drug. Often times, people try 3 times or more before being able to quit. This document explains the best ways for you to prepare to quit smoking. Quitting takes hard work and a lot of effort, but you can do it. ADVANTAGES OF QUITTING SMOKING  You will live longer, feel better, and live better.  Your body will feel the impact of quitting smoking almost immediately.  Within 20 minutes, blood pressure decreases. Your pulse returns to its normal level.  After 8 hours, carbon monoxide levels in the blood return to normal. Your oxygen level increases.  After 24 hours, the chance of having a heart attack starts to decrease. Your breath, hair, and body stop smelling like smoke.  After 48 hours, damaged nerve endings begin to recover. Your sense of taste and smell improve.  After 72 hours, the body is virtually free of nicotine. Your bronchial tubes relax and breathing becomes easier.  After 2 to 12 weeks, lungs can hold more air. Exercise becomes easier and circulation improves.  The risk of having a heart attack, stroke, cancer, or lung disease is greatly reduced.  After 1 year, the risk of coronary heart disease is cut in half.  After 5 years, the risk of stroke falls to the same as a nonsmoker.  After 10 years, the risk of lung cancer is cut in half and the risk of other cancers decreases significantly.  After 15 years, the risk of coronary heart disease drops, usually to the level of a nonsmoker.  If you are pregnant, quitting smoking will improve your chances of having a healthy baby.  The people you live with, especially any children, will be healthier.  You will have extra money to spend on things other than cigarettes. QUESTIONS TO THINK ABOUT BEFORE ATTEMPTING TO QUIT You may want to talk about your answers with your  caregiver.  Why do you want to quit?  If you tried to quit in the past, what helped and what did not?  What will be the most difficult situations for you after you quit? How will you plan to handle them?  Who can help you through the tough times? Your family? Friends? A caregiver?  What pleasures do you get from smoking? What ways can you still get pleasure if you quit? Here are some questions to ask your caregiver:  How can you help me to be successful at quitting?  What medicine do you think would be best for me and how should I take it?  What should I do if I need more help?  What is smoking withdrawal like? How can I get information on withdrawal? GET READY  Set a quit date.  Change your environment by getting rid of all cigarettes, ashtrays, matches, and lighters in your home, car, or work. Do not let people smoke in your home.  Review your past attempts to quit. Think about what worked and what did not. GET SUPPORT AND ENCOURAGEMENT You have a better chance of being successful if you have help. You can get support in many ways.  Tell your family, friends, and co-workers that you are going to quit and need their support. Ask them not to smoke around you.  Get individual, group, or telephone counseling and support. Programs are available at local hospitals and health centers. Call your local health department for   information about programs in your area.  Spiritual beliefs and practices may help some smokers quit.  Download a "quit meter" on your computer to keep track of quit statistics, such as how long you have gone without smoking, cigarettes not smoked, and money saved.  Get a self-help book about quitting smoking and staying off of tobacco. LEARN NEW SKILLS AND BEHAVIORS  Distract yourself from urges to smoke. Talk to someone, go for a walk, or occupy your time with a task.  Change your normal routine. Take a different route to work. Drink tea instead of coffee.  Eat breakfast in a different place.  Reduce your stress. Take a hot bath, exercise, or read a book.  Plan something enjoyable to do every day. Reward yourself for not smoking.  Explore interactive web-based programs that specialize in helping you quit. GET MEDICINE AND USE IT CORRECTLY Medicines can help you stop smoking and decrease the urge to smoke. Combining medicine with the above behavioral methods and support can greatly increase your chances of successfully quitting smoking.  Nicotine replacement therapy helps deliver nicotine to your body without the negative effects and risks of smoking. Nicotine replacement therapy includes nicotine gum, lozenges, inhalers, nasal sprays, and skin patches. Some may be available over-the-counter and others require a prescription.  Antidepressant medicine helps people abstain from smoking, but how this works is unknown. This medicine is available by prescription.  Nicotinic receptor partial agonist medicine simulates the effect of nicotine in your brain. This medicine is available by prescription. Ask your caregiver for advice about which medicines to use and how to use them based on your health history. Your caregiver will tell you what side effects to look out for if you choose to be on a medicine or therapy. Carefully read the information on the package. Do not use any other product containing nicotine while using a nicotine replacement product.  RELAPSE OR DIFFICULT SITUATIONS Most relapses occur within the first 3 months after quitting. Do not be discouraged if you start smoking again. Remember, most people try several times before finally quitting. You may have symptoms of withdrawal because your body is used to nicotine. You may crave cigarettes, be irritable, feel very hungry, cough often, get headaches, or have difficulty concentrating. The withdrawal symptoms are only temporary. They are strongest when you first quit, but they will go away within  10 14 days. To reduce the chances of relapse, try to:  Avoid drinking alcohol. Drinking lowers your chances of successfully quitting.  Reduce the amount of caffeine you consume. Once you quit smoking, the amount of caffeine in your body increases and can give you symptoms, such as a rapid heartbeat, sweating, and anxiety.  Avoid smokers because they can make you want to smoke.  Do not let weight gain distract you. Many smokers will gain weight when they quit, usually less than 10 pounds. Eat a healthy diet and stay active. You can always lose the weight gained after you quit.  Find ways to improve your mood other than smoking. FOR MORE INFORMATION  www.smokefree.gov  Document Released: 12/13/2000 Document Revised: 06/20/2011 Document Reviewed: 03/30/2011 [employer] Patient Information 2014 [employer].  

## 2013-05-27 NOTE — Progress Notes (Signed)
[facility] [place] HISTORY AND PHYSICAL    HPI: This is a 48 y.o. female patient of Dr. Darrick Penna returns for followup today. She had evacuation of a right retroperitoneal hematoma and repair of a femoral pseudoaneurysm on 04/14/13 after cardiac catheterization. She still complains of peri-incisional pain.  At her visit 2 weeks ago with Dr. Darrick Penna she had healed right lower quadrant incision no evidence of hernia, right groin incision no drainage, some maceration of the superior aspect 1 cm opening of groin incision no surrounding erythema no purulent drainage, abdominal pain and right lower quadrant pain and right groin pain. At that Time Dr. Darrick Penna discussed with her that she is one month out from her operation and should not really be requiring narcotic pain medication at that point. He did write her a prescription that day for Percocet 5/325 #40 dispensed. Dr. Darrick Penna also made a contract with her that day that she will not get any further narcotic pain medication after 2 weeks. Pt denies fever or chills, she has been using soap and water to wash the wound daily, applying bacitracin ointment afterward with clean dressing. Takes Plavix for cardiac stent placement January, 2015, also takes daily 81 mg ASA and a statin. Pt c/o pain from right groin incision tha radiates around right hip and down medial aspect of right leg, pain not improved at 7-8/10. Is taking Tylenol for pain which is not helping, ran out of Percocet 4-5 days ago which was helping to decrease pain t a 2/10. Pt reports stroke several years ago with no residual neurologic deficits.   Past Medical History  Diagnosis Date  . Hypertension   . Stroke     tia with no deficits  . CAD (coronary artery disease) 11/13/2011    50% ? proximal LCx stenosis per Cath at [facility]  . PVD (peripheral vascular disease)     Right CFA stenosis per report on 11/14/2011  . HLD (hyperlipidemia)   . LBBB (left bundle branch block)     . Acute myocardial infarction of other lateral wall, initial episode of care 01/2013    DES CFX  . Intermediate coronary syndrome    Past Surgical History  Procedure Laterality Date  . Cardiac catheterization  04/14/2013    Non-obstructive disease, patent CFX stent  . Tubal ligation    . Appendectomy    . Groin debridement Right 04/14/2013    Procedure: Emergency Evacuation of Retroperitoneal Hematoma and Repair Right External Iliac Artery Pseudoaneurysm    ;  Surgeon: Sherren Kerns, MD;  Location: [facility] OR;  Service: Vascular;  Laterality: Right;  . Coronary angioplasty with stent placement  01/17/2013    mild disease except 99% CFX, rx with  2.5 x 28 Alpine drug-eluting stent     Allergies  Allergen Reactions  . Brilinta [Ticagrelor] Other (See Comments)    Dyspnea  . Other Other (See Comments)    BP med ? BP bottomed out  . Penicillins Rash    Current Outpatient Prescriptions  Medication Sig Dispense Refill  . acetaminophen (TYLENOL) 325 MG tablet Take 325 mg by mouth every 6 (six) hours as needed for moderate pain.      Marland Kitchen aspirin 81 MG tablet Take 1 tablet (81 mg total) by mouth daily.  30 tablet    . bacitracin ointment Apply 1 application topically 2 (two) times daily.  120 g  0  . clopidogrel (PLAVIX) 75 MG tablet Take 1 tablet (75 mg total) by mouth  daily with breakfast.  90 tablet  3  . isosorbide mononitrate (IMDUR) 30 MG 24 hr tablet Take 1 tablet (30 mg total) by mouth daily.  30 tablet  11  . nitroGLYCERIN (NITROSTAT) 0.4 MG SL tablet Place 1 tablet (0.4 mg total) under the tongue every 5 (five) minutes x 3 doses as needed for chest pain.  25 tablet  2  . rosuvastatin (CRESTOR) 10 MG tablet Take 10 mg by mouth daily.      Marland Kitchen docusate sodium (COLACE) 100 MG capsule Take 100 mg by mouth daily.      Marland Kitchen oxyCODONE-acetaminophen (PERCOCET/ROXICET) 5-325 MG per tablet Take 1 tablet by mouth every 4 (four) hours as needed for severe pain.  30 tablet  0   No current  facility-administered medications for this visit.    Family History  Problem Relation Age of Onset  . CAD Mother 28  . Lung cancer Mother   . Bladder Cancer Mother   . Stroke Mother   . Cancer Mother     Lung and Bladder  . Heart disease Mother     Before age 26  . Hypertension Mother   . Heart attack Mother   . CAD Father 30  . Heart disease Father     Before age 1  . Heart attack Father   . Stroke Father     Bleeding stroke    History   Social History  . Marital Status: Married    Spouse Name: N/A    Number of Children: N/A  . Years of Education: N/A   Occupational History  . Not on file.   Social History Main Topics  . Smoking status: Current Every Day Smoker -- 0.50 packs/day  . Smokeless tobacco: Never Used  . Alcohol Use: No  . Drug Use: Yes    Special: Marijuana  . Sexual Activity: Yes    Birth Control/ Protection: None   Other Topics Concern  . Not on file   Social History Narrative  . No narrative on file     ROS:  See HPI for pertinent positives and negatives.   PHYSICAL EXAMINATION:   Filed Vitals:   05/27/13 0945  BP: 134/87  Pulse: 79  Resp: 16  Height: 5\' 2"  (1.575 m)  Weight: 130 lb (58.968 kg)  SpO2: 99%    Body mass index is 23.77 kg/(m^2).  General:  WDWN in NAD Gait: Not observed HENT: WNL, normocephalic Eyes: Pupils equal Pulmonary: normal non-labored breathing , without Rales, rhonchi,  wheezing Cardiac: RRR, without  Murmurs, rubs or gallops; without carotid bruits Abdomen: soft, no masses, RLQ tenderness to palpation Skin: without rashes, right LQ abdominal incision with small area of non proximated edges, small amount serous seepage. Right groin with about 0.5 cm diameter opening draining serous exudate, no redness, no swellling Vascular Exam/Pulses: Extremities: without ischemic changes, without Gangrene , without cellulitis; 2-3+ DP and PT pulses bilaterally. Musculoskeletal: no muscle wasting or  atrophy  Neurologic: A&O X 3; Appropriate Affect ; SENSATION: normal; MOTOR FUNCTION:  moving all extremities equally. Speech is fluent/normal  ASSESSMENT: 48 y.o. female who is s/p evacuation of a right retroperitoneal hematoma and repair of a femoral pseudoaneurysm on 04/14/13 after cardiac catheterization. Right  groin does not appear infected, may have a draining tract. She does not have DM but does continue to smoke which inhibits wound healing, counseled re smoking cessation.  PLAN: Based on today's exam and after discussing with Dr. Arbie Cookey, and after Dr. Arbie Cookey  examined pt and spoke with her, return to clinic in 2 weeks to see Dr. Darrick PennaFields, Tramadol prescribed for pain.    Rosalita ChessmanSuzanne Nickel, RN, MSN, FNP-C [facility]   Clinic MD:  Pt seen and examined in conjunction with Dr. Arbie CookeyEarly

## 2013-05-27 NOTE — Progress Notes (Signed)
Patient ID: Lindsay Camacho, female   DOB: Jul 10, 1965, 48 y.o.   MRN: [id number] Patient ID: Lindsay Camacho, female   DOB: [birth year], 48 y.o.   MRN: [id number]    [address] [place], [place]  [address] Phone: [phone] Fax:  [phone]  Date:  05/27/2013   ID:  Lindsay Camacho, DOB Dec 01, 1965, MRN [id number]  PCP:  No PCP Per Patient      History of Present Illness: Allicia Ruppenthal is a 48 y.o. female who had a lateral wall MI.  She had a DES to the circumflex.  She has had some mild Cp related to turning a certain way.  No problems with walking other than SHOB with exertion.  She continues to smoke, although les than before.  She ended up coming to the hospital and ruled out for MI, but had an angiogram..  She had a repeat cath due to sx in April 2015.   unfortunately, she had a retroperitoneal bleed. She had emergent surgery. She has had significant pain since that time. It is improving. No further chest discomfort.  Wt Readings from Last 3 Encounters:  05/27/13 130 lb (58.968 kg)  05/27/13 130 lb (58.968 kg)  05/15/13 124 lb (56.246 kg)     Past Medical History  Diagnosis Date  . Hypertension   . Stroke     tia with no deficits  . CAD (coronary artery disease) 11/13/2011    50% ? proximal LCx stenosis per Cath at [facility]  . PVD (peripheral vascular disease)     Right CFA stenosis per report on 11/14/2011  . HLD (hyperlipidemia)   . LBBB (left bundle branch block)   . Acute myocardial infarction of other lateral wall, initial episode of care 01/2013    DES CFX  . Intermediate coronary syndrome     Current Outpatient Prescriptions  Medication Sig Dispense Refill  . acetaminophen (TYLENOL) 325 MG tablet Take 325 mg by mouth every 6 (six) hours as needed for moderate pain.      Marland Kitchen aspirin 81 MG tablet Take 1 tablet (81 mg total) by mouth daily.  30 tablet    . bacitracin ointment Apply 1 application topically 2 (two) times daily.  120 g  0  . clopidogrel (PLAVIX) 75 MG tablet  Take 1 tablet (75 mg total) by mouth daily with breakfast.  90 tablet  3  . isosorbide mononitrate (IMDUR) 30 MG 24 hr tablet Take 1 tablet (30 mg total) by mouth daily.  30 tablet  11  . nitroGLYCERIN (NITROSTAT) 0.4 MG SL tablet Place 1 tablet (0.4 mg total) under the tongue every 5 (five) minutes x 3 doses as needed for chest pain.  25 tablet  2  . rosuvastatin (CRESTOR) 10 MG tablet Take 10 mg by mouth daily.      . traMADol (ULTRAM) 50 MG tablet Take 1 tablet (50 mg total) by mouth every 6 (six) hours as needed.  30 tablet  0   No current facility-administered medications for this visit.    Allergies:    Allergies  Allergen Reactions  . Brilinta [Ticagrelor] Other (See Comments)    Dyspnea  . Other Other (See Comments)    BP med ? BP bottomed out  . Penicillins Rash    Social History:  The patient  reports that she has been smoking.  She has never used smokeless tobacco. She reports that she uses illicit drugs (Marijuana). She reports that she does not drink alcohol.  Family History:  The patient's family history includes Bladder Cancer in her mother; CAD (age of onset: 554) in her mother; CAD (age of onset: 6865) in her father; Cancer in her mother; Heart attack in her father and mother; Heart disease in her father and mother; Hypertension in her mother; Lung cancer in her mother; Stroke in her father and mother.   ROS:  Please see the history of present illness.  No nausea, vomiting.  No fevers, chills.  No focal weakness.  No dysuria. Mild dizziness.    All other systems reviewed and negative.   PHYSICAL EXAM: VS:  BP 114/100  Pulse 70  Ht 5\' 2"  (1.575 m)  Wt 130 lb (58.968 kg)  BMI 23.77 kg/m2 Well nourished, well developed, in no acute distress HEENT: normal Neck: no JVD, no carotid bruits Cardiac:  normal S1, S2; RRR;  Lungs:  clear to auscultation bilaterally, no wheezing, rhonchi or rales Abd: soft, nontender, no hepatomegaly Ext: no edema, area that is open in the  groin incision; 2+ right DP pulse Skin: warm and dry Neuro:   no focal abnormalities noted        ASSESSMENT AND PLAN:  1. Lateral wall MI: DES in circumflex.  Continue DAPT for at least a year.  Importance of DAPT was stressed to the patient.  No chest pains at this time.  No significant coronary artery disease noted at the time of repeat catheterization.  EF was 40-45% by echocardiogram. 2. HTN:   Several low blood pressure readings cause us to stop her antihypertensive medicine. She has had significant high readings in the past. Follow blood pressure at home.  Readings at home have been <120 mm Hg. 3. Tobacco abuse: She needs to stop smoking.  She has cut back. 4.   Hyperlipidemia: Will recheck lipids in 3 months.  May need to lower dose of liptor at that point.  LDL was 140 at the time of her MI.  Rechecked today.  WIll call her with results. 5. following up with Dr. Darrick Pennafields regarding a retroperitoneal bleed. There is some area of her wound which is not yet healed.  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 She requested femoral approach versus radial approach do to spasm and she had a right arm. Given this bleeding issues, if Was made in the future, would insist on right radial approach. Would have to use 5 French catheters to deal with the spasm but this consult be done successfully.  Signed, Fredric MareJay S. Alexzia Kasler, MD, [facility] 05/27/2013 12:00 PM

## 2013-05-30 ENCOUNTER — Other Ambulatory Visit: Payer: Self-pay | Admitting: Cardiology

## 2013-05-30 ENCOUNTER — Encounter: Payer: Self-pay | Admitting: Cardiology

## 2013-05-30 DIAGNOSIS — E785 Hyperlipidemia, unspecified: Secondary | ICD-10-CM

## 2013-06-12 ENCOUNTER — Encounter: Payer: BC Managed Care – PPO | Admitting: Vascular Surgery

## 2013-06-23 ENCOUNTER — Other Ambulatory Visit: Payer: Self-pay | Admitting: Interventional Cardiology

## 2013-08-14 ENCOUNTER — Other Ambulatory Visit: Payer: Self-pay

## 2013-08-14 MED ORDER — ROSUVASTATIN CALCIUM 10 MG PO TABS: ORAL_TABLET | ORAL | Status: DC

## 2013-08-25 ENCOUNTER — Telehealth: Payer: Self-pay | Admitting: Interventional Cardiology

## 2013-08-25 MED ORDER — ROSUVASTATIN CALCIUM 10 MG PO TABS: ORAL_TABLET | ORAL | Status: DC

## 2013-08-25 NOTE — Telephone Encounter (Signed)
New message          C/o pain in left arm / bp is normal / pt would like to know if dr Eldridge Dace will approve her crestor medication

## 2013-08-25 NOTE — Telephone Encounter (Addendum)
Spoke with pt and she requests a refill for crestor because she has been out for about 4-5 weeks. Pt started having constant left arm pain last Wednesday. Yesterday she got her elbow to "pop" and it felt better and then today it started hurting again. Pt describes the pain as an achy pain. Pts BP today was 116/86 with HR 101 . Pt denies CP, dizziness, and lightheadedness or any other symptoms. Pt will try tylenol to see if this helps. Please advise.

## 2013-08-26 NOTE — Telephone Encounter (Signed)
Does not sound lke cardiac pain.  COntinue to monitor for sx that feel like her prior MI.

## 2013-08-26 NOTE — Telephone Encounter (Signed)
Pt.notified

## 2013-09-18 ENCOUNTER — Telehealth: Payer: Self-pay | Admitting: Interventional Cardiology

## 2013-09-18 NOTE — Telephone Encounter (Signed)
New message      Pt is on plavix and she needs a tooth extraction.  Can she stop her plavix prior to the extraction?

## 2013-09-18 NOTE — Telephone Encounter (Signed)
To Dr. Varanasi, please advise.  

## 2013-09-23 NOTE — Telephone Encounter (Signed)
No. Cannot stop plavix until January 18 2014

## 2013-09-24 NOTE — Telephone Encounter (Signed)
lmtrc

## 2013-09-26 NOTE — Telephone Encounter (Signed)
Follow Up ° ° ° °Pt returning call from yesterday. Please call. °

## 2013-09-26 NOTE — Telephone Encounter (Signed)
Spoke with pt, aware of dr Hoyle Barr recommendations

## 2013-10-27 ENCOUNTER — Other Ambulatory Visit: Payer: Self-pay

## 2013-10-27 MED ORDER — NITROGLYCERIN 0.4 MG SL SUBL: 0.4000 mg | SUBLINGUAL_TABLET | SUBLINGUAL | Status: DC | PRN

## 2013-11-15 ENCOUNTER — Emergency Department (HOSPITAL_COMMUNITY)
Admission: EM | Admit: 2013-11-15 | Discharge: 2013-11-15 | Disposition: A | Discharge status: Home / Self Care | Payer: BC Managed Care – PPO | Attending: Emergency Medicine | Admitting: Emergency Medicine

## 2013-11-15 ENCOUNTER — Emergency Department (HOSPITAL_COMMUNITY): Payer: BC Managed Care – PPO

## 2013-11-15 ENCOUNTER — Encounter (HOSPITAL_COMMUNITY): Payer: Self-pay | Admitting: Emergency Medicine

## 2013-11-15 DIAGNOSIS — R0789 Other chest pain: Secondary | ICD-10-CM | POA: Diagnosis present

## 2013-11-15 DIAGNOSIS — I447 Left bundle-branch block, unspecified: Secondary | ICD-10-CM | POA: Diagnosis not present

## 2013-11-15 DIAGNOSIS — Y999 Unspecified external cause status: Secondary | ICD-10-CM | POA: Diagnosis not present

## 2013-11-15 DIAGNOSIS — S29011A Strain of muscle and tendon of front wall of thorax, initial encounter: Secondary | ICD-10-CM | POA: Insufficient documentation

## 2013-11-15 DIAGNOSIS — I252 Old myocardial infarction: Secondary | ICD-10-CM | POA: Diagnosis not present

## 2013-11-15 DIAGNOSIS — R079 Chest pain, unspecified: Secondary | ICD-10-CM

## 2013-11-15 DIAGNOSIS — Z7901 Long term (current) use of anticoagulants: Secondary | ICD-10-CM | POA: Diagnosis not present

## 2013-11-15 DIAGNOSIS — E785 Hyperlipidemia, unspecified: Secondary | ICD-10-CM | POA: Diagnosis not present

## 2013-11-15 DIAGNOSIS — X58XXXA Exposure to other specified factors, initial encounter: Secondary | ICD-10-CM | POA: Insufficient documentation

## 2013-11-15 DIAGNOSIS — Y929 Unspecified place or not applicable: Secondary | ICD-10-CM | POA: Insufficient documentation

## 2013-11-15 DIAGNOSIS — Z888 Allergy status to other drugs, medicaments and biological substances status: Secondary | ICD-10-CM | POA: Insufficient documentation

## 2013-11-15 DIAGNOSIS — I739 Peripheral vascular disease, unspecified: Secondary | ICD-10-CM | POA: Insufficient documentation

## 2013-11-15 DIAGNOSIS — Z880 Allergy status to penicillin: Secondary | ICD-10-CM | POA: Diagnosis not present

## 2013-11-15 DIAGNOSIS — Z8673 Personal history of transient ischemic attack (TIA), and cerebral infarction without residual deficits: Secondary | ICD-10-CM | POA: Diagnosis not present

## 2013-11-15 DIAGNOSIS — Z7982 Long term (current) use of aspirin: Secondary | ICD-10-CM | POA: Insufficient documentation

## 2013-11-15 DIAGNOSIS — I2510 Atherosclerotic heart disease of native coronary artery without angina pectoris: Secondary | ICD-10-CM | POA: Insufficient documentation

## 2013-11-15 DIAGNOSIS — Z720 Tobacco use: Secondary | ICD-10-CM | POA: Diagnosis not present

## 2013-11-15 DIAGNOSIS — Y939 Activity, unspecified: Secondary | ICD-10-CM | POA: Insufficient documentation

## 2013-11-15 LAB — CBC WITH DIFFERENTIAL/PLATELET
Basophils Absolute: 0 10*3/uL (ref 0.0–0.1)
Basophils Relative: 0 % (ref 0–1)
Eosinophils Absolute: 0.2 10*3/uL (ref 0.0–0.7)
Eosinophils Relative: 2 % (ref 0–5)
HEMATOCRIT: 43.7 % (ref 36.0–46.0)
Hemoglobin: 14.4 g/dL (ref 12.0–15.0)
LYMPHS PCT: 32 % (ref 12–46)
Lymphs Abs: 2.4 10*3/uL (ref 0.7–4.0)
MCH: 29.6 pg (ref 26.0–34.0)
MCHC: 33 g/dL (ref 30.0–36.0)
MCV: 89.7 fL (ref 78.0–100.0)
MONO ABS: 0.6 10*3/uL (ref 0.1–1.0)
MONOS PCT: 8 % (ref 3–12)
Neutro Abs: 4.2 10*3/uL (ref 1.7–7.7)
Neutrophils Relative %: 58 % (ref 43–77)
Platelets: 196 10*3/uL (ref 150–400)
RBC: 4.87 MIL/uL (ref 3.87–5.11)
RDW: 14.3 % (ref 11.5–15.5)
WBC: 7.3 10*3/uL (ref 4.0–10.5)

## 2013-11-15 LAB — COMPREHENSIVE METABOLIC PANEL
ALT: 9 U/L (ref 0–35)
ANION GAP: 17 — AB (ref 5–15)
AST: 17 U/L (ref 0–37)
Albumin: 3.9 g/dL (ref 3.5–5.2)
Alkaline Phosphatase: 83 U/L (ref 39–117)
BILIRUBIN TOTAL: 0.5 mg/dL (ref 0.3–1.2)
BUN: 16 mg/dL (ref 6–23)
CO2: 23 meq/L (ref 19–32)
CREATININE: 0.8 mg/dL (ref 0.50–1.10)
Calcium: 9.4 mg/dL (ref 8.4–10.5)
Chloride: 101 mEq/L (ref 96–112)
GFR, EST NON AFRICAN AMERICAN: 86 mL/min — AB (ref >90)
Glucose, Bld: 84 mg/dL (ref 70–99)
Potassium: 3.7 mEq/L (ref 3.7–5.3)
Sodium: 141 mEq/L (ref 137–147)
Total Protein: 7.6 g/dL (ref 6.0–8.3)

## 2013-11-15 LAB — TROPONIN I

## 2013-11-15 LAB — I-STAT TROPONIN, ED: Troponin i, poc: 0.02 ng/mL (ref 0.00–0.08)

## 2013-11-15 MED ORDER — METHOCARBAMOL 500 MG PO TABS: 1000.0000 mg | ORAL_TABLET | Freq: Three times a day (TID) | ORAL | Status: DC | PRN

## 2013-11-15 MED ORDER — IBUPROFEN 600 MG PO TABS: 600.0000 mg | ORAL_TABLET | Freq: Four times a day (QID) | ORAL | Status: DC | PRN

## 2013-11-15 NOTE — Discharge Instructions (Signed)
Chest Wall Pain °Chest wall pain is pain in or around the bones and muscles of your chest. It may take up to 6 weeks to get better. It may take longer if you must stay physically active in your work and activities.  °CAUSES  °Chest wall pain may happen on its own. However, it may be caused by: °· A viral illness like the flu. °· Injury. °· Coughing. °· Exercise. °· Arthritis. °· Fibromyalgia. °· Shingles. °HOME CARE INSTRUCTIONS  °· Avoid overtiring physical activity. Try not to strain or perform activities that cause pain. This includes any activities using your chest or your abdominal and side muscles, especially if heavy weights are used. °· Put ice on the sore area. °¨ Put ice in a plastic bag. °¨ Place a towel between your skin and the bag. °¨ Leave the ice on for 15-20 minutes per hour while awake for the first 2 days. °· Only take over-the-counter or prescription medicines for pain, discomfort, or fever as directed by your caregiver. °SEEK IMMEDIATE MEDICAL CARE IF:  °· Your pain increases, or you are very uncomfortable. °· You have a fever. °· Your chest pain becomes worse. °· You have new, unexplained symptoms. °· You have nausea or vomiting. °· You feel sweaty or lightheaded. °· You have a cough with phlegm (sputum), or you cough up blood. °MAKE SURE YOU:  °· Understand these instructions. °· Will watch your condition. °· Will get help right away if you are not doing well or get worse. °Document Released: 12/19/2004 Document Revised: 03/13/2011 Document Reviewed: 08/15/2010 °[employer]® Patient Information ©2015 [employer]. This information is not intended to replace advice given to you by your health care provider. Make sure you discuss any questions you have with your health care provider. °Muscle Strain °A muscle strain is an injury that occurs when a muscle is stretched beyond its normal length. Usually a small number of muscle fibers are torn when this happens. Muscle strain is rated in degrees.  First-degree strains have the least amount of muscle fiber tearing and pain. Second-degree and third-degree strains have increasingly more tearing and pain.  °Usually, recovery from muscle strain takes 1-2 weeks. Complete healing takes 5-6 weeks.  °CAUSES  °Muscle strain happens when a sudden, violent force placed on a muscle stretches it too far. This may occur with lifting, sports, or a fall.  °RISK FACTORS °Muscle strain is especially common in athletes.  °SIGNS AND SYMPTOMS °At the site of the muscle strain, there may be: °· Pain. °· Bruising. °· Swelling. °· Difficulty using the muscle due to pain or lack of normal function. °DIAGNOSIS  °Your health care provider will perform a physical exam and ask about your medical history. °TREATMENT  °Often, the best treatment for a muscle strain is resting, icing, and applying cold compresses to the injured area.   °HOME CARE INSTRUCTIONS  °· Use the PRICE method of treatment to promote muscle healing during the first 2-3 days after your injury. The PRICE method involves: °¨ Protecting the muscle from being injured again. °¨ Restricting your activity and resting the injured body part. °¨ Icing your injury. To do this, put ice in a plastic bag. Place a towel between your skin and the bag. Then, apply the ice and leave it on from 15-20 minutes each hour. After the third day, switch to moist heat packs. °¨ Apply compression to the injured area with a splint or elastic bandage. Be careful not to wrap it too tightly. This may   interfere with blood circulation or increase swelling. °¨ Elevate the injured body part above the level of your heart as often as you can. °· Only take over-the-counter or prescription medicines for pain, discomfort, or fever as directed by your health care provider. °· Warming up prior to exercise helps to prevent future muscle strains. °SEEK MEDICAL CARE IF:  °· You have increasing pain or swelling in the injured area. °· You have numbness, tingling, or  a significant loss of strength in the injured area. °MAKE SURE YOU:  °· Understand these instructions. °· Will watch your condition. °· Will get help right away if you are not doing well or get worse. °Document Released: 12/19/2004 Document Revised: 10/09/2012 Document Reviewed: 07/18/2012 °[employer]® Patient Information ©2015 [employer]. This information is not intended to replace advice given to you by your health care provider. Make sure you discuss any questions you have with your health care provider. ° °

## 2013-11-15 NOTE — ED Notes (Signed)
Patient is alert and orientedx4.  Patient was explained discharge instructions and they understood them with no questions.   

## 2013-11-15 NOTE — ED Notes (Signed)
Patient transported to X-ray 

## 2013-11-15 NOTE — ED Notes (Signed)
Pt reports L sided chest pain under breast radiates to leg. Hx of MI in Feb, and CVA with no deficits. 4 nitro and and ASA given.

## 2013-11-15 NOTE — ED Provider Notes (Signed)
CSN: [id number]     Arrival date & time 11/15/13  0116 History   First MD Initiated Contact with Patient 11/15/13 0119     Chief Complaint  Patient presents with  . Chest Pain     (Consider location/radiation/quality/duration/timing/severity/associated sxs/prior Treatment) HPI Patient presents with left sided thoracic back pain starting 2 days ago. She then began having gradual onset left anterior chest pain this afternoon. The pain is worse with movement and palpation. She has no shortness of breath or cough. Patient denies any recent trauma or heavy lifting. She's had no increased lower extremity swelling or pain. Patient was given nitroglycerin and aspirin en route by EMS. She's had no fever or chills. Past Medical History  Diagnosis Date  . Hypertension   . Stroke     tia with no deficits  . CAD (coronary artery disease) 11/13/2011    50% ? [facility]  . PVD (peripheral vascular disease)     Right CFA stenosis per report on 11/14/2011  . HLD (hyperlipidemia)   . LBBB (left bundle branch block)   . Acute myocardial infarction of other lateral wall, initial episode of care 01/2013    DES CFX  . Intermediate coronary syndrome    Past Surgical History  Procedure Laterality Date  . Cardiac catheterization  04/14/2013    Non-obstructive disease, patent CFX stent  . Tubal ligation    . Appendectomy    . 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 Coronary angioplasty with stent placement  01/17/2013    mild disease except 99% CFX, rx with  2.5 x 28 Alpine drug-eluting stent    Family History  Problem Relation Age of Onset  . CAD Mother 1054  . Lung cancer Mother   . Bladder Cancer Mother   . Stroke Mother   . Cancer Mother     Lung and Bladder  .  Heart disease Mother     Before age 360  . Hypertension Mother   . Heart attack Mother   . CAD Father [id number]  . Heart disease Father     Before age 48  . Heart attack Father   . Stroke Father     Bleeding stroke   History  Substance Use Topics  . Smoking status: Current Every Day Smoker -- 0.50 packs/day  . Smokeless tobacco: Never Used  . Alcohol Use: No   OB History    No data available     Review of Systems  Constitutional: Negative for fever and chills.  Respiratory: Negative for cough and shortness of breath.   Cardiovascular: Positive for chest pain. Negative for palpitations and leg swelling.  Gastrointestinal: Negative for nausea, vomiting, abdominal pain and diarrhea.  Musculoskeletal: Positive for myalgias and back pain. Negative for neck pain and neck stiffness.  Skin: Negative for rash and wound.  Neurological: Negative for dizziness, weakness, light-headedness, numbness and headaches.  All other systems reviewed and are negative.     Allergies  Brilinta; Other; and Penicillins  Home Medications   Prior to Admission medications   Medication Sig Start Date End Date Taking? Authorizing Provider  acetaminophen (TYLENOL) 325 MG tablet Take 325 mg by mouth every 6 (six) hours as needed for moderate pain.   Yes Historical Provider, MD  aspirin 81 MG tablet Take 1 tablet (81 mg total) by mouth daily. 10/08/12  Yes Christiane Ha, MD  clopidogrel (PLAVIX) 75 MG tablet Take 1 tablet (75 mg total) by mouth daily with breakfast. 01/20/13  Yes Abelino Derrick, PA-C  isosorbide mononitrate (IMDUR) 30 MG 24 hr tablet Take 1 tablet (30 mg total) by mouth daily. 04/18/13  Yes Rhonda G Barrett, PA-C  nitroGLYCERIN (NITROSTAT) 0.4 MG SL tablet Place 1 tablet (0.4 mg total) under the tongue every 5 (five) minutes x 3 doses as needed for chest pain. 10/27/13  Yes Corky Crafts, MD  rosuvastatin (CRESTOR) 10 MG tablet Take 10 mg by mouth daily.   Yes Historical Provider, MD   bacitracin ointment Apply 1 application topically 2 (two) times daily. Patient not taking: Reported on 11/15/2013 05/14/13   Nelia Shi, MD  ibuprofen (ADVIL,MOTRIN) 600 MG tablet Take 1 tablet (600 mg total) by mouth every 6 (six) hours as needed. 11/15/13   Loren Racer, MD  methocarbamol (ROBAXIN) 500 MG tablet Take 2 tablets (1,000 mg total) by mouth every 8 (eight) hours as needed for muscle spasms. 11/15/13   Loren Racer, MD  rosuvastatin (CRESTOR) 10 MG tablet TAKE 1 TABLET DAILY IF YOU CAN'T TOLERATE 10MG , THEN REDUCE TO 5MG  DAILY. Patient not taking: Reported on 11/15/2013 08/25/13   Corky Crafts, MD  traMADol (ULTRAM) 50 MG tablet Take 1 tablet (50 mg total) by mouth every 6 (six) hours as needed. Patient not taking: Reported on 11/15/2013 05/27/13   Carma Lair Nickel, NP   BP 144/87 mmHg  Pulse 79  Temp(Src) 97.8 F (36.6 C) (Oral)  Resp 16  SpO2 95% Physical Exam  Constitutional: She is oriented to person, place, and time. She appears well-developed and well-nourished. No distress.  HENT:  Head: Normocephalic and atraumatic.  Mouth/Throat: Oropharynx is clear and moist.  Eyes: EOM are normal. Pupils are equal, round, and reactive to light.  Neck: Normal range of motion. Neck supple.  Cardiovascular: Normal rate and regular rhythm.   Pulmonary/Chest: Effort normal and breath sounds normal. No respiratory distress. She has no wheezes. She has no rales. She exhibits tenderness (chest tenderness is completely reproduced with palpation of the left lower chest and left thoracic back. No midline tenderness. There is no crepitance or deformity).  Abdominal: Soft. Bowel sounds are normal. She exhibits no distension and no mass. There is no tenderness. There is no rebound and no guarding.  Musculoskeletal: Normal range of motion. She exhibits no edema or tenderness.  No calf swelling or tenderness.  Neurological: She is alert and oriented to person, place, and time.   Moves all extremities without deficit. Sensation is normal.  Skin: Skin is warm and dry. No rash noted. No erythema.  Psychiatric: She has a normal mood and affect. Her behavior is normal.  Nursing note and vitals reviewed.   ED Course  Procedures (including critical care time) Labs Review Labs Reviewed  COMPREHENSIVE METABOLIC PANEL - Abnormal; Notable for the following:    GFR calc non Af Amer 86 (*)    Anion gap 17 (*)    All other components within normal limits  CBC WITH DIFFERENTIAL  TROPONIN I  Rosezena Sensor, ED    Imaging Review Dg Chest 2 View  11/15/2013   CLINICAL DATA:  Initial evaluation for left side back pain yesterday now involving left chest  EXAM: CHEST  2 VIEW  COMPARISON:  05/02/2013  FINDINGS: Heart size and vascular pattern  are normal. Lungs are clear. No effusion or pneumothorax. Bony thorax intact. Left cardiac stent noted.  IMPRESSION: No active cardiopulmonary disease.   Electronically Signed   By: Esperanza Heir M.D.   On: 11/15/2013 03:06     EKG Interpretation None      Date: 11/15/2013  Rate: 84  Rhythm: normal sinus rhythm  QRS Axis: normal  Intervals: QRS prolonged  ST/T Wave abnormalities: nonspecific T wave changes  Conduction Disutrbances:left bundle branch block  Narrative Interpretation:   Old EKG Reviewed: unchanged J-point elevation in V2 and V3. T-wave inversion in V6. Overall the EKG appears unchanged from previous.  MDM   Final diagnoses:  Chest pain  Muscle strain of chest wall, initial encounter      Troponin is negative 2. EKG is unchanged. Physical exam is characteristic of muscle strain. We will treat with anti-inflammatories and muscle relaxants. Patient been given return precautions and has voice understanding.  Loren Racer, MD 11/15/13 [id number]

## 2013-11-16 ENCOUNTER — Telehealth: Payer: Self-pay | Admitting: Physician Assistant

## 2013-11-16 NOTE — Telephone Encounter (Signed)
Lindsay Camacho is a 48 y.o. female with a hx of CAD s/p DES to the CFX in 01/2013.  Re-look LHC in 4/15 with patent CFX stent and no significant CAD elsewhere.  She remains on ASA and Plavix.  She was seen in the ED yesterday with chest pain.  It is L sided and brief.  It is non-exertional.  It is not like her previous angina.  She was dx with chest wall pain and given Ibuprofen and Robaxin to take.  She called to see if this was safe to take. I advised her that she could take the Ibuprofen and the Robaxin.  I told her to not take the Ibuprofen for more than 3 days in a row. She agreed with this plan. Signed,  Tereso Newcomer, PA-C   11/16/2013 3:13 PM

## 2013-11-21 ENCOUNTER — Telehealth: Payer: Self-pay | Admitting: Interventional Cardiology

## 2013-11-21 NOTE — Telephone Encounter (Signed)
I spoke with the pt and she complains of a constant pain under her left breast that is worse with movement.  The pt has been using Ibuprofen and Robaxin as needed from the ER. The pt states that her pain is worse at work due to her repetitive motion. The pt is not scheduled to work this weekend. I made the pt aware that she needs to rest this weekend and limit her upper body movement, continue PRN Robaxin and apply heat to area.  Pt agreed with plan. I will forward this message to Dr Eldridge Dace for review and any other recommendations.

## 2013-11-21 NOTE — Telephone Encounter (Signed)
Pt went to ER Friday night for chest pain, was told she had a muscle strain and gave her Ibuprofen and muscle relaxer, pain was off and on and now it's constant, her new job consist's of a lot of upper body movement, pls advise [phone]

## 2013-11-25 NOTE — Telephone Encounter (Signed)
Agree with conservative management as outlined.

## 2013-12-03 ENCOUNTER — Ambulatory Visit: Payer: BC Managed Care – PPO | Admitting: Interventional Cardiology

## 2013-12-11 ENCOUNTER — Encounter (HOSPITAL_COMMUNITY): Payer: Self-pay | Admitting: Interventional Cardiology

## 2014-01-08 ENCOUNTER — Telehealth: Payer: Self-pay | Admitting: Interventional Cardiology

## 2014-01-08 ENCOUNTER — Emergency Department (HOSPITAL_COMMUNITY): Payer: BLUE CROSS/BLUE SHIELD

## 2014-01-08 ENCOUNTER — Inpatient Hospital Stay (HOSPITAL_COMMUNITY)
Admission: EM | Admit: 2014-01-08 | Discharge: 2014-01-09 | DRG: 292 | Disposition: A | Discharge status: Home / Self Care | Payer: BLUE CROSS/BLUE SHIELD | Attending: Cardiology | Admitting: Cardiology

## 2014-01-08 ENCOUNTER — Encounter (HOSPITAL_COMMUNITY): Payer: Self-pay

## 2014-01-08 DIAGNOSIS — F1290 Cannabis use, unspecified, uncomplicated: Secondary | ICD-10-CM | POA: Diagnosis present

## 2014-01-08 DIAGNOSIS — Z8249 Family history of ischemic heart disease and other diseases of the circulatory system: Secondary | ICD-10-CM | POA: Diagnosis not present

## 2014-01-08 DIAGNOSIS — I739 Peripheral vascular disease, unspecified: Secondary | ICD-10-CM | POA: Diagnosis present

## 2014-01-08 DIAGNOSIS — I255 Ischemic cardiomyopathy: Secondary | ICD-10-CM | POA: Diagnosis present

## 2014-01-08 DIAGNOSIS — I509 Heart failure, unspecified: Secondary | ICD-10-CM

## 2014-01-08 DIAGNOSIS — Z7982 Long term (current) use of aspirin: Secondary | ICD-10-CM | POA: Diagnosis not present

## 2014-01-08 DIAGNOSIS — I5031 Acute diastolic (congestive) heart failure: Principal | ICD-10-CM | POA: Diagnosis present

## 2014-01-08 DIAGNOSIS — Z823 Family history of stroke: Secondary | ICD-10-CM | POA: Diagnosis not present

## 2014-01-08 DIAGNOSIS — Z7902 Long term (current) use of antithrombotics/antiplatelets: Secondary | ICD-10-CM | POA: Diagnosis not present

## 2014-01-08 DIAGNOSIS — Z888 Allergy status to other drugs, medicaments and biological substances status: Secondary | ICD-10-CM | POA: Diagnosis not present

## 2014-01-08 DIAGNOSIS — Z8673 Personal history of transient ischemic attack (TIA), and cerebral infarction without residual deficits: Secondary | ICD-10-CM | POA: Diagnosis not present

## 2014-01-08 DIAGNOSIS — Z955 Presence of coronary angioplasty implant and graft: Secondary | ICD-10-CM | POA: Diagnosis not present

## 2014-01-08 DIAGNOSIS — I2510 Atherosclerotic heart disease of native coronary artery without angina pectoris: Secondary | ICD-10-CM | POA: Diagnosis present

## 2014-01-08 DIAGNOSIS — Z720 Tobacco use: Secondary | ICD-10-CM | POA: Diagnosis not present

## 2014-01-08 DIAGNOSIS — E785 Hyperlipidemia, unspecified: Secondary | ICD-10-CM | POA: Diagnosis present

## 2014-01-08 DIAGNOSIS — I447 Left bundle-branch block, unspecified: Secondary | ICD-10-CM | POA: Diagnosis present

## 2014-01-08 DIAGNOSIS — R0602 Shortness of breath: Secondary | ICD-10-CM

## 2014-01-08 DIAGNOSIS — I200 Unstable angina: Secondary | ICD-10-CM | POA: Diagnosis present

## 2014-01-08 DIAGNOSIS — R0789 Other chest pain: Secondary | ICD-10-CM | POA: Diagnosis present

## 2014-01-08 DIAGNOSIS — I10 Essential (primary) hypertension: Secondary | ICD-10-CM | POA: Diagnosis present

## 2014-01-08 DIAGNOSIS — I252 Old myocardial infarction: Secondary | ICD-10-CM | POA: Diagnosis not present

## 2014-01-08 DIAGNOSIS — Z880 Allergy status to penicillin: Secondary | ICD-10-CM

## 2014-01-08 DIAGNOSIS — Z801 Family history of malignant neoplasm of trachea, bronchus and lung: Secondary | ICD-10-CM

## 2014-01-08 DIAGNOSIS — R079 Chest pain, unspecified: Secondary | ICD-10-CM | POA: Insufficient documentation

## 2014-01-08 DIAGNOSIS — Z791 Long term (current) use of non-steroidal anti-inflammatories (NSAID): Secondary | ICD-10-CM | POA: Diagnosis not present

## 2014-01-08 DIAGNOSIS — I25110 Atherosclerotic heart disease of native coronary artery with unstable angina pectoris: Secondary | ICD-10-CM | POA: Diagnosis present

## 2014-01-08 HISTORY — DX: Other chest pain: R07.89

## 2014-01-08 LAB — CBC WITH DIFFERENTIAL/PLATELET
Basophils Absolute: 0 10*3/uL (ref 0.0–0.1)
Basophils Relative: 0 % (ref 0–1)
Eosinophils Absolute: 0.1 10*3/uL (ref 0.0–0.7)
Eosinophils Relative: 1 % (ref 0–5)
HCT: 39.9 % (ref 36.0–46.0)
HEMOGLOBIN: 13.1 g/dL (ref 12.0–15.0)
LYMPHS ABS: 1.6 10*3/uL (ref 0.7–4.0)
LYMPHS PCT: 27 % (ref 12–46)
MCH: 29.5 pg (ref 26.0–34.0)
MCHC: 32.8 g/dL (ref 30.0–36.0)
MCV: 89.9 fL (ref 78.0–100.0)
MONOS PCT: 6 % (ref 3–12)
Monocytes Absolute: 0.4 10*3/uL (ref 0.1–1.0)
NEUTROS ABS: 4 10*3/uL (ref 1.7–7.7)
NEUTROS PCT: 66 % (ref 43–77)
Platelets: 193 10*3/uL (ref 150–400)
RBC: 4.44 MIL/uL (ref 3.87–5.11)
RDW: 15.1 % (ref 11.5–15.5)
WBC: 6.1 10*3/uL (ref 4.0–10.5)

## 2014-01-08 LAB — COMPREHENSIVE METABOLIC PANEL
ALT: 16 U/L (ref 0–35)
AST: 21 U/L (ref 0–37)
Albumin: 3.3 g/dL — ABNORMAL LOW (ref 3.5–5.2)
Alkaline Phosphatase: 70 U/L (ref 39–117)
Anion gap: 7 (ref 5–15)
BUN: 9 mg/dL (ref 6–23)
CO2: 25 mmol/L (ref 19–32)
Calcium: 8.4 mg/dL (ref 8.4–10.5)
Chloride: 109 mEq/L (ref 96–112)
Creatinine, Ser: 0.8 mg/dL (ref 0.50–1.10)
GFR calc Af Amer: >90 mL/min (ref >90)
GFR calc non Af Amer: 86 mL/min — ABNORMAL LOW (ref >90)
GLUCOSE: 105 mg/dL — AB (ref 70–99)
Potassium: 3.9 mmol/L (ref 3.5–5.1)
Sodium: 141 mmol/L (ref 135–145)
Total Bilirubin: 0.7 mg/dL (ref 0.3–1.2)
Total Protein: 6.5 g/dL (ref 6.0–8.3)

## 2014-01-08 LAB — BRAIN NATRIURETIC PEPTIDE: B Natriuretic Peptide: 1118.4 pg/mL — ABNORMAL HIGH (ref 0.0–100.0)

## 2014-01-08 LAB — PROTIME-INR
INR: 1.1 (ref 0.00–1.49)
Prothrombin Time: 14.4 seconds (ref 11.6–15.2)

## 2014-01-08 LAB — TROPONIN I: Troponin I: <0.03 ng/mL (ref <0.031)

## 2014-01-08 LAB — TSH: TSH: 1.449 u[IU]/mL (ref 0.350–4.500)

## 2014-01-08 LAB — MRSA PCR SCREENING: MRSA BY PCR: NEGATIVE

## 2014-01-08 LAB — MAGNESIUM: Magnesium: 1.7 mg/dL (ref 1.5–2.5)

## 2014-01-08 MED ORDER — ONDANSETRON HCL 4 MG/2ML IJ SOLN: 4.0000 mg | Freq: Four times a day (QID) | INTRAMUSCULAR | Status: DC | PRN

## 2014-01-08 MED ORDER — MORPHINE SULFATE 2 MG/ML IJ SOLN
2.0000 mg | INTRAMUSCULAR | Status: DC | PRN
  Administered 2014-01-08 (×2): 2 mg via INTRAVENOUS
  Filled 2014-01-08 (×2): qty 1

## 2014-01-08 MED ORDER — ISOSORBIDE MONONITRATE ER 30 MG PO TB24
30.0000 mg | ORAL_TABLET | Freq: Every day | ORAL | Status: DC
  Administered 2014-01-09: 30 mg via ORAL
  Filled 2014-01-08: qty 1

## 2014-01-08 MED ORDER — ONDANSETRON HCL 4 MG/2ML IJ SOLN
4.0000 mg | Freq: Once | INTRAMUSCULAR | Status: AC
  Administered 2014-01-08: 4 mg via INTRAVENOUS
  Filled 2014-01-08: qty 2

## 2014-01-08 MED ORDER — HYDROCODONE-ACETAMINOPHEN 5-325 MG PO TABS: 1.0000 | ORAL_TABLET | ORAL | Status: DC | PRN

## 2014-01-08 MED ORDER — FUROSEMIDE 10 MG/ML IJ SOLN
40.0000 mg | Freq: Once | INTRAMUSCULAR | Status: AC
  Administered 2014-01-08: 40 mg via INTRAVENOUS
  Filled 2014-01-08: qty 4

## 2014-01-08 MED ORDER — MORPHINE SULFATE 2 MG/ML IJ SOLN
2.0000 mg | Freq: Once | INTRAMUSCULAR | Status: AC
  Administered 2014-01-08: 2 mg via INTRAVENOUS
  Filled 2014-01-08: qty 1

## 2014-01-08 MED ORDER — ALUM & MAG HYDROXIDE-SIMETH 200-200-20 MG/5ML PO SUSP: 30.0000 mL | Freq: Four times a day (QID) | ORAL | Status: DC | PRN

## 2014-01-08 MED ORDER — ROSUVASTATIN CALCIUM 10 MG PO TABS: 10.0000 mg | ORAL_TABLET | Freq: Every day | ORAL | Status: DC

## 2014-01-08 MED ORDER — ASPIRIN EC 81 MG PO TBEC
81.0000 mg | DELAYED_RELEASE_TABLET | Freq: Every day | ORAL | Status: DC
  Administered 2014-01-09: 81 mg via ORAL
  Filled 2014-01-08 (×2): qty 1

## 2014-01-08 MED ORDER — KETOROLAC TROMETHAMINE 30 MG/ML IJ SOLN
30.0000 mg | Freq: Three times a day (TID) | INTRAMUSCULAR | Status: DC
  Administered 2014-01-08 – 2014-01-09 (×2): 30 mg via INTRAVENOUS
  Filled 2014-01-08 (×2): qty 1

## 2014-01-08 MED ORDER — SODIUM CHLORIDE 0.9 % IV SOLN
250.0000 mL | INTRAVENOUS | Status: DC | PRN
Start: 2014-01-08 — End: 2014-01-09

## 2014-01-08 MED ORDER — SODIUM CHLORIDE 0.9 % IJ SOLN: 3.0000 mL | INTRAMUSCULAR | Status: DC | PRN

## 2014-01-08 MED ORDER — TRAMADOL HCL 50 MG PO TABS: 50.0000 mg | ORAL_TABLET | Freq: Four times a day (QID) | ORAL | Status: DC | PRN

## 2014-01-08 MED ORDER — NITROGLYCERIN 0.4 MG SL SUBL: 0.4000 mg | SUBLINGUAL_TABLET | SUBLINGUAL | Status: DC | PRN

## 2014-01-08 MED ORDER — FUROSEMIDE 10 MG/ML IJ SOLN
40.0000 mg | Freq: Every day | INTRAMUSCULAR | Status: DC
  Filled 2014-01-08: qty 4

## 2014-01-08 MED ORDER — HEPARIN SODIUM (PORCINE) 5000 UNIT/ML IJ SOLN
5000.0000 [IU] | Freq: Three times a day (TID) | INTRAMUSCULAR | Status: DC
  Administered 2014-01-08 – 2014-01-09 (×2): 5000 [IU] via SUBCUTANEOUS
  Filled 2014-01-08 (×2): qty 1

## 2014-01-08 MED ORDER — DOCUSATE SODIUM 100 MG PO CAPS: 100.0000 mg | ORAL_CAPSULE | Freq: Every day | ORAL | Status: DC | PRN

## 2014-01-08 MED ORDER — ACETAMINOPHEN 325 MG PO TABS: 325.0000 mg | ORAL_TABLET | Freq: Four times a day (QID) | ORAL | Status: DC | PRN

## 2014-01-08 MED ORDER — SODIUM CHLORIDE 0.9 % IJ SOLN
3.0000 mL | Freq: Two times a day (BID) | INTRAMUSCULAR | Status: DC
  Administered 2014-01-09: 3 mL via INTRAVENOUS

## 2014-01-08 MED ORDER — ROSUVASTATIN CALCIUM 10 MG PO TABS
10.0000 mg | ORAL_TABLET | Freq: Every day | ORAL | Status: DC
  Administered 2014-01-09: 10 mg via ORAL
  Filled 2014-01-08: qty 1

## 2014-01-08 MED ORDER — ZOLPIDEM TARTRATE 5 MG PO TABS: 5.0000 mg | ORAL_TABLET | Freq: Every evening | ORAL | Status: DC | PRN

## 2014-01-08 MED ORDER — CLOPIDOGREL BISULFATE 75 MG PO TABS
75.0000 mg | ORAL_TABLET | Freq: Every day | ORAL | Status: DC
  Administered 2014-01-09: 75 mg via ORAL
  Filled 2014-01-08: qty 1

## 2014-01-08 MED ORDER — ASPIRIN EC 81 MG PO TBEC
81.0000 mg | DELAYED_RELEASE_TABLET | Freq: Every day | ORAL | Status: DC
Start: 2014-01-08 — End: 2014-01-08

## 2014-01-08 MED ORDER — PNEUMOCOCCAL VAC POLYVALENT 25 MCG/0.5ML IJ INJ
0.5000 mL | INJECTION | INTRAMUSCULAR | Status: AC
  Administered 2014-01-09: 0.5 mL via INTRAMUSCULAR
  Filled 2014-01-08: qty 0.5

## 2014-01-08 MED ORDER — ONDANSETRON HCL 4 MG PO TABS: 4.0000 mg | ORAL_TABLET | Freq: Four times a day (QID) | ORAL | Status: DC | PRN

## 2014-01-08 MED ORDER — METHOCARBAMOL 500 MG PO TABS
1000.0000 mg | ORAL_TABLET | Freq: Three times a day (TID) | ORAL | Status: DC
  Administered 2014-01-09: 1000 mg via ORAL
  Filled 2014-01-08: qty 2

## 2014-01-08 MED ORDER — MORPHINE SULFATE 4 MG/ML IJ SOLN
4.0000 mg | Freq: Once | INTRAMUSCULAR | Status: AC
  Administered 2014-01-08: 4 mg via INTRAVENOUS
  Filled 2014-01-08: qty 1

## 2014-01-08 NOTE — Telephone Encounter (Signed)
New message    Pt c/o BP issue:  1. What are your last 5 BP readings? 154/104 2. Are you having any other symptoms (ex. Dizziness, headache, blurred vision, passed out)? Headache, hard time breathing, chest pain 3. What is your medication issue?

## 2014-01-08 NOTE — H&P (Signed)
Lindsay Camacho is an 49 y.o. female.    Primary Cardiologist:Dr. Irish Lack  No PCP Per Patient  Chief Complaint: chest pain and SOB  HPI: 49 y.o. female who had a lateral wall MI 01/2013, had a DES to the circumflex. Had been doing well except withSHOB with exertion. She continues to smoke, although les than before now half a pk, down from whole pk.     She had a repeat cath in April 2015 for chest pain with patent stent in LCX. EF 50%,unfortunately, she had a retroperitoneal bleed. She had emergent surgery with evacuation of rt retroperitoneal hematoma and repair of rt external iliac artery. Last Echo 04/2013   EF 50-55% with grade 2 diastolic dysfunction.  Mild MR and AR.  Pt presents to ER today secondary to waking with chest pain, Lt chest and around into back.  + diaphoresis and SOB no nausea.  Some dizziness today.  SHe took NTG at home with some relief but it did return.  Now in ER has had 40 IV lasix and is receiving second dose of Morphine, still with pain.  Pro BNP elevated. CXR mild pul. Edema.  EKG with LBBB and no acute changes.  LBBB old.   She was seen in [place] ER in Dec. And given a pill for elevated heart pressures.  No prescription given.   Past Medical History  Diagnosis Date  . Hypertension   . Stroke     tia with no deficits  . CAD (coronary artery disease) 11/13/2011    50% ? proximal LCx stenosis per Cath at [facility]  . PVD (peripheral vascular disease)     Right CFA stenosis per report on 11/14/2011  . HLD (hyperlipidemia)   . LBBB (left bundle branch block)   . Acute myocardial infarction of other lateral wall, initial episode of care 01/2013    DES CFX  . Intermediate coronary syndrome     Past Surgical History  Procedure Laterality Date  . Cardiac catheterization  04/14/2013    Non-obstructive disease, patent CFX stent  . Tubal ligation    . Appendectomy    . Groin debridement Right 04/14/2013    Procedure: Emergency Evacuation of  Retroperitoneal Hematoma and Repair Right External Iliac Artery Pseudoaneurysm    ;  Surgeon: Elam Dutch, MD;  Location: [facility] OR;  Service: Vascular;  Laterality: Right;  . Coronary angioplasty with stent placement  01/17/2013    mild disease except 99% CFX, rx with  2.5 x 28 Alpine drug-eluting stent   . Left heart catheterization with coronary angiogram N/A 01/18/2013    Procedure: LEFT HEART CATHETERIZATION WITH CORONARY ANGIOGRAM;  Surgeon: Jettie Booze, MD;  Location: [facility] CATH LAB;  Service: Cardiovascular;  Laterality: N/A;  . Percutaneous coronary stent intervention (pci-s)  01/18/2013    Procedure: PERCUTANEOUS CORONARY STENT INTERVENTION (PCI-S);  Surgeon: Jettie Booze, MD;  Location: [facility] CATH LAB;  Service: Cardiovascular;;  . Left heart catheterization with coronary angiogram N/A 04/14/2013    Procedure: LEFT HEART CATHETERIZATION WITH CORONARY ANGIOGRAM;  Surgeon: Jettie Booze, MD;  Location: [facility] CATH LAB;  Service: Cardiovascular;  Laterality: N/A;    Family History  Problem Relation Age of Onset  . CAD Mother 66  . Lung cancer Mother   . Bladder Cancer Mother   . Stroke Mother   . Cancer Mother     Lung and Bladder  . Heart disease Mother     Before  age 25  . Hypertension Mother   . Heart attack Mother   . CAD Father 38  . Heart disease Father     Before age 47  . Heart attack Father   . Stroke Father     Bleeding stroke   Social History:  reports that she has been smoking.  She has never used smokeless tobacco. She reports that she uses illicit drugs (Marijuana). She reports that she does not drink alcohol. married, she is down from 1 ppd to half ppd.  Allergies:  Allergies  Allergen Reactions  . Brilinta [Ticagrelor] Other (See Comments)    Dyspnea  . Other Other (See Comments)    BP med ? BP bottomed out  . Penicillins Rash    OUTPATIENT Meds: No current facility-administered medications on file prior to encounter.   Current Outpatient  Prescriptions on File Prior to Encounter  Medication Sig Dispense Refill  . acetaminophen (TYLENOL) 325 MG tablet Take 325 mg by mouth every 6 (six) hours as needed for moderate pain.    Marland Kitchen aspirin 81 MG tablet Take 1 tablet (81 mg total) by mouth daily. 30 tablet   . bacitracin ointment Apply 1 application topically 2 (two) times daily. (Patient not taking: Reported on 11/15/2013) 120 g 0  . clopidogrel (PLAVIX) 75 MG tablet Take 1 tablet (75 mg total) by mouth daily with breakfast. 90 tablet 3  . ibuprofen (ADVIL,MOTRIN) 600 MG tablet Take 1 tablet (600 mg total) by mouth every 6 (six) hours as needed. 30 tablet 0  . isosorbide mononitrate (IMDUR) 30 MG 24 hr tablet Take 1 tablet (30 mg total) by mouth daily. 30 tablet 11  . methocarbamol (ROBAXIN) 500 MG tablet Take 2 tablets (1,000 mg total) by mouth every 8 (eight) hours as needed for muscle spasms. 30 tablet 0  . nitroGLYCERIN (NITROSTAT) 0.4 MG SL tablet Place 1 tablet (0.4 mg total) under the tongue every 5 (five) minutes x 3 doses as needed for chest pain. 25 tablet 6  . rosuvastatin (CRESTOR) 10 MG tablet TAKE 1 TABLET DAILY IF YOU CAN'T TOLERATE 10MG, THEN REDUCE TO 5MG DAILY. (Patient not taking: Reported on 11/15/2013) 30 tablet 6  . rosuvastatin (CRESTOR) 10 MG tablet Take 10 mg by mouth daily.    . traMADol (ULTRAM) 50 MG tablet Take 1 tablet (50 mg total) by mouth every 6 (six) hours as needed. (Patient not taking: Reported on 11/15/2013) 30 tablet 0     Results for orders placed or performed during the hospital encounter of 01/08/14 (from the past 48 hour(s))  CBC with Differential     Status: None   Collection Time: 01/08/14  2:09 PM  Result Value Ref Range   WBC 6.1 4.0 - 10.5 K/uL   RBC 4.44 3.87 - 5.11 MIL/uL   Hemoglobin 13.1 12.0 - 15.0 g/dL   HCT 39.9 36.0 - 46.0 %   MCV 89.9 78.0 - 100.0 fL   MCH 29.5 26.0 - 34.0 pg   MCHC 32.8 30.0 - 36.0 g/dL   RDW 15.1 11.5 - 15.5 %   Platelets 193 150 - 400 K/uL   Neutrophils  Relative % 66 43 - 77 %   Neutro Abs 4.0 1.7 - 7.7 K/uL   Lymphocytes Relative 27 12 - 46 %   Lymphs Abs 1.6 0.7 - 4.0 K/uL   Monocytes Relative 6 3 - 12 %   Monocytes Absolute 0.4 0.1 - 1.0 K/uL   Eosinophils Relative 1 0 - 5 %  Eosinophils Absolute 0.1 0.0 - 0.7 K/uL   Basophils Relative 0 0 - 1 %   Basophils Absolute 0.0 0.0 - 0.1 K/uL  Comprehensive metabolic panel     Status: Abnormal   Collection Time: 01/08/14  2:09 PM  Result Value Ref Range   Sodium 141 135 - 145 mmol/L    Comment: Please note change in reference range.   Potassium 3.9 3.5 - 5.1 mmol/L    Comment: Please note change in reference range.   Chloride 109 96 - 112 mEq/L   CO2 25 19 - 32 mmol/L   Glucose, Bld 105 (H) 70 - 99 mg/dL   BUN 9 6 - 23 mg/dL   Creatinine, Ser 0.80 0.50 - 1.10 mg/dL   Calcium 8.4 8.4 - 10.5 mg/dL   Total Protein 6.5 6.0 - 8.3 g/dL   Albumin 3.3 (L) 3.5 - 5.2 g/dL   AST 21 0 - 37 U/L   ALT 16 0 - 35 U/L   Alkaline Phosphatase 70 39 - 117 U/L   Total Bilirubin 0.7 0.3 - 1.2 mg/dL   GFR calc non Af Amer 86 (L) >90 mL/min   GFR calc Af Amer >90 >90 mL/min    Comment: (NOTE) The eGFR has been calculated using the CKD EPI equation. This calculation has not been validated in all clinical situations. eGFR's persistently <90 mL/min signify possible Chronic Kidney Disease.    Anion gap 7 5 - 15  Troponin I     Status: None   Collection Time: 01/08/14  2:09 PM  Result Value Ref Range   Troponin I <0.03 <0.031 ng/mL    Comment:        NO INDICATION OF MYOCARDIAL INJURY. Please note change in reference range.   Brain natriuretic peptide     Status: Abnormal   Collection Time: 01/08/14  2:09 PM  Result Value Ref Range   B Natriuretic Peptide 1118.4 (H) 0.0 - 100.0 pg/mL    Comment: Please note change in reference range.   Dg Chest 2 View  01/08/2014   CLINICAL DATA:  Chest pain, shortness of breath  EXAM: CHEST  2 VIEW  COMPARISON:  12/21/2013  FINDINGS: There are bilateral  interstitial and alveolar airspace opacities. There is no definite pleural effusion. There is no pneumothorax or focal consolidation. The heart and mediastinal contours are stable.  The osseous structures are unremarkable.  IMPRESSION: Findings most concerning for mild pulmonary edema.   Electronically Signed   By: Kathreen Devoid   On: 01/08/2014 14:11    ROS: General:no colds or fevers, no weight changes, her daughter stated she does not eat much Skin:no rashes or ulcers HEENT:no blurred vision, no congestion CV:see HPI PUL:see HPI GI:no diarrhea constipation or melena, no indigestion GU:no hematuria, no dysuria MS:no joint pain, no claudication Neuro:no syncope, no lightheadedness, + dizziness today Endo:no diabetes, no thyroid disease   Blood pressure 152/97, pulse 95, temperature 97.9 F (36.6 C), temperature source Axillary, resp. rate 19, SpO2 97 %. PE: General:Pleasant affect, SOB and with pain Skin:Warm and dry, brisk capillary refill HEENT:normocephalic, sclera clear, mucus membranes moist Neck:supple, + JVD, no bruits  Heart:S1S2 RRR without murmur, gallup, rub or click Lungs: with scattered rales, no rhonchi, or wheezes ELY:HTMB, non tender, + BS, do not palpate liver spleen or masses Ext:no lower ext edema, 2+ pedal pulses, 2+ radial pulses Neuro:alert and oriented X 3, MAE, follows commands, + facial symmetry    Assessment/Plan Principal Problem:   Unstable angina-  troponin negative, will add IV NTG as this helped at home, there is muscular skeletal component as well, follow serial troponin -if positive would cath, if negative may need stress test, continue plavix add Heparin.   Active Problems:   CAD - CFX DES 01/17/13- patent stent in 04/2013   HTN (hypertension)- stable    LBBB (left bundle branch block)-chronic   Tobacco abuse- counseled to stop   Cardiomyopathy, ischemic- EF 40-45% by echo 01/18/13, improved in 04/2013 to 50-55%, now with CHF - IV lasix given, may be  due to grade 2DD   Hyperlipidemia with target LDL less than 70 on crestor ciontinue    [facility] R Nurse Practitioner Certified [place] Pager [phone] or after 5pm or weekends call [phone] 01/08/2014, 4:29 PM  As above, patient seen and examined. Briefly she is a 49 year old female with a past medical history of coronary artery disease, hypertension, tobacco abuse, hyperlipidemia, left bundle branch block with chest pain. The patient is status post drug-eluting stent to her circumflex in January 2015. She had recurrent chest pain requiring repeat catheterization in April 2015. There was no obstructive disease noted. Her stent was patent. Her procedure was complicated by retroperitoneal hematoma requiring evacuation and surgical intervention. Patient has had intermittent chest pain since then. She states that she has had increasing dyspnea since December 20. There is dyspnea on exertion and worsening orthopnea but no pedal edema. She developed chest pain this morning. The pain is in the left chest area radiating to the left ribs. It is constant and described as a sharp aching pain. It increases with certain movements and lying on her left side.  1 chest pain-symptoms are extremely atypical. They are reproduced with palpation. Most likely musculoskeletal. Electrocardiogram shows chronic left bundle branch block. Initial enzymes negative. We'll cycle further enzymes and if negative would not pursue further ischemia evaluation. 2 dyspnea-patient's symptoms sound to be mild congestive heart failure. Repeat echocardiogram. BNP elevated at 1118. Chest x-ray shows mild edema. Will diurese with Lasix 40 mg IV daily while here. Follow potassium and renal function. Would most likely discharge on low-dose Lasix at 20 mg daily. 3 tobacco abuse-patient counseled on discontinuing. 4 hyperlipidemia-continue statin. 5 coronary artery disease-continue aspirin, Plavix and statin. Kirk Ruths

## 2014-01-08 NOTE — Telephone Encounter (Signed)
Spoke with pt who reports being very SOB with talking but then states "just at times"  She has a productive cough that sometimes produces white or brown sputum.  She is a smoker and is requesting Wellbutrin to help her stop smoking because the "patches don't work."  She states she has had a constant pain across her chest since she went to the hospital on the 20th.  She took her Imdur this AM about 9.   She reports that she does not have a PCP.  Requested she recheck her BP and I will call her back.  Called pt back and she reports her BP is 170/121.  Advised that BP reading does not sound accurate and she should have it rechecked some where else.  She is going to her friends house to have it rechecked by her nurse.  Aware I will call back in a few minutes to f/u.  Called pt to f/u on BP - she reports her BP is 135/115 manually and was checked by a nurse.  Advised she will need to go to Urgent Care or ED for evaluation and treatment.  She states understanding.

## 2014-01-08 NOTE — ED Notes (Signed)
Per ems pt woke up at 0730 central/left sided chest pain and shortness of breath. Pt has MI hx 01/02/13. Pt also complaining of left sided rib pain. Pt received 3 nitro and 324 ASA and pain eased up but on arrival pain is same as it was before nitro admin. Pt on 2L [place]. EKG was transmitted and no changes since last EKG. Pt has cva hx.

## 2014-01-08 NOTE — Telephone Encounter (Signed)
Follow up   Pt c/o of Chest Pain: STAT if CP now or developed within 24 hours  1. Are you having CP right now? Yes   2. Are you experiencing any other symptoms (ex. SOB, nausea, vomiting, sweating)?  3. How long have you been experiencing CP? Since this am    4. Is your CP continuous or coming and going? continuous  5. Have you taken Nitroglycerin? One @ 10:30 am / took two tylenol extra strength .  ?  Pt c/o BP issue:  1. What are your last 5 BP readings? Manual 135/115 - left arm @ 11:50 am.   2. Are you having any other symptoms (ex. Dizziness, headache, blurred vision, passed out)?  Yes, sob, constain pain in chest since this am.  3. What is your medication issue?     Pain level  9-10

## 2014-01-08 NOTE — ED Notes (Signed)
Assisted pt to bedside commode.

## 2014-01-08 NOTE — ED Provider Notes (Signed)
CSN: [id number]     Arrival date & time 01/08/14  1317 History   First MD Initiated Contact with Patient 01/08/14 1319     Chief Complaint  Patient presents with  . Chest Pain  . Shortness of Breath     (Consider location/radiation/quality/duration/timing/severity/associated sxs/prior Treatment) HPI Comments: Patient presents to the ER for evaluation of chest pain and shortness of breath. Patient reports that she has not been breathing well for the last couple of weeks. Today her breathing significantly worsened. She is having an aching pain over the left side of her chest into the axilla region. She is brought to the ER by ambulance. She was administered nitroglycerin. She reports that the nitroglycerin did temporarily help the pain, but it is now back to where it wasn't as severe. Patient reports no associated fever, cough with the shortness of breath.  Patient is a 49 y.o. female presenting with chest pain and shortness of breath.  Chest Pain Associated symptoms: shortness of breath   Shortness of Breath Associated symptoms: chest pain     Past Medical History  Diagnosis Date  . Hypertension   . Stroke     tia with no deficits  . CAD (coronary artery disease) 11/13/2011    50% ? proximal LCx stenosis per Cath at [facility]  . PVD (peripheral vascular disease)     Right CFA stenosis per report on 11/14/2011  . HLD (hyperlipidemia)   . LBBB (left bundle branch block)   . Acute myocardial infarction of other lateral wall, initial episode of care 01/2013    DES CFX  . Intermediate coronary syndrome    Past Surgical History  Procedure Laterality Date  . Cardiac catheterization  04/14/2013    Non-obstructive disease, patent CFX stent  . Tubal ligation    . Appendectomy    . Groin debridement Right 04/14/2013    Procedure: Emergency Evacuation of Retroperitoneal Hematoma and Repair Right External Iliac Artery Pseudoaneurysm    ;  Surgeon: Sherren Kerns, MD;  Location: [facility] OR;  Service:  Vascular;  Laterality: Right;  . Coronary angioplasty with stent placement  01/17/2013    mild disease except 99% CFX, rx with  2.5 x 28 Alpine drug-eluting stent   . Left heart catheterization with coronary angiogram N/A 01/18/2013    Procedure: LEFT HEART CATHETERIZATION WITH CORONARY ANGIOGRAM;  Surgeon: Corky Crafts, MD;  Location: [facility] CATH LAB;  Service: Cardiovascular;  Laterality: N/A;  . Percutaneous coronary stent intervention (pci-s)  01/18/2013    Procedure: PERCUTANEOUS CORONARY STENT INTERVENTION (PCI-S);  Surgeon: Corky Crafts, MD;  Location: [facility] CATH LAB;  Service: Cardiovascular;;  . Left heart catheterization with coronary angiogram N/A 04/14/2013    Procedure: LEFT HEART CATHETERIZATION WITH CORONARY ANGIOGRAM;  Surgeon: Corky Crafts, MD;  Location: [facility] CATH LAB;  Service: Cardiovascular;  Laterality: N/A;   Family History  Problem Relation Age of Onset  . CAD Mother 66  . Lung cancer Mother   . Bladder Cancer Mother   . Stroke Mother   . Cancer Mother     Lung and Bladder  . Heart disease Mother     Before age 20  . Hypertension Mother   . Heart attack Mother   . CAD Father 20  . Heart disease Father     Before age 24  . Heart attack Father   . Stroke Father     Bleeding stroke   History  Substance Use Topics  . Smoking status: Current  Every Day Smoker -- 0.50 packs/day  . Smokeless tobacco: Never Used  . Alcohol Use: No   OB History    No data available     Review of Systems  Respiratory: Positive for shortness of breath.   Cardiovascular: Positive for chest pain.  All other systems reviewed and are negative.     Allergies  Brilinta; Other; and Penicillins  Home Medications   Prior to Admission medications   Medication Sig Start Date End Date Taking? Authorizing Provider  acetaminophen (TYLENOL) 325 MG tablet Take 325 mg by mouth every 6 (six) hours as needed for moderate pain.    Historical Provider, MD  aspirin 81 MG tablet  Take 1 tablet (81 mg total) by mouth daily. 10/08/12   Christiane Ha, MD  bacitracin ointment Apply 1 application topically 2 (two) times daily. Patient not taking: Reported on 11/15/2013 05/14/13   Nelia Shi, MD  clopidogrel (PLAVIX) 75 MG tablet Take 1 tablet (75 mg total) by mouth daily with breakfast. 01/20/13   Abelino Derrick, PA-C  ibuprofen (ADVIL,MOTRIN) 600 MG tablet Take 1 tablet (600 mg total) by mouth every 6 (six) hours as needed. 11/15/13   Loren Racer, MD  isosorbide mononitrate (IMDUR) 30 MG 24 hr tablet Take 1 tablet (30 mg total) by mouth daily. 04/18/13   Rhonda G Barrett, PA-C  methocarbamol (ROBAXIN) 500 MG tablet Take 2 tablets (1,000 mg total) by mouth every 8 (eight) hours as needed for muscle spasms. 11/15/13   Loren Racer, MD  nitroGLYCERIN (NITROSTAT) 0.4 MG SL tablet Place 1 tablet (0.4 mg total) under the tongue every 5 (five) minutes x 3 doses as needed for chest pain. 10/27/13   Corky Crafts, MD  rosuvastatin (CRESTOR) 10 MG tablet TAKE 1 TABLET DAILY IF YOU CAN'T TOLERATE , THEN REDUCE TO  DAILY. Patient not taking: Reported on 11/15/2013 08/25/13   Corky Crafts, MD  rosuvastatin (CRESTOR) 10 MG tablet Take 10 mg by mouth daily.    Historical Provider, MD  traMADol (ULTRAM) 50 MG tablet Take 1 tablet (50 mg total) by mouth every 6 (six) hours as needed. Patient not taking: Reported on 11/15/2013 05/27/13   Carma Lair Nickel, NP   BP 135/83 mmHg  Pulse 93  Temp(Src) 97.9 F (36.6 C) (Axillary)  Resp 18  SpO2 96% Physical Exam  Constitutional: She is oriented to person, place, and time. She appears well-developed and well-nourished. No distress.  HENT:  Head: Normocephalic and atraumatic.  Right Ear: Hearing normal.  Left Ear: Hearing normal.  Nose: Nose normal.  Mouth/Throat: Oropharynx is clear and moist and mucous membranes are normal.  Eyes: Conjunctivae and EOM are normal. Pupils are equal, round, and reactive to light.   Neck: Normal range of motion. Neck supple.  Cardiovascular: Regular rhythm, S1 normal and S2 normal.  Exam reveals no gallop and no friction rub.   No murmur heard. Pulmonary/Chest: Effort normal. No respiratory distress. She has decreased breath sounds. She has rales. She exhibits no tenderness.  Abdominal: Soft. Normal appearance and bowel sounds are normal. There is no hepatosplenomegaly. There is no tenderness. There is no rebound, no guarding, no tenderness at McBurney's point and negative Murphy's sign. No hernia.  Musculoskeletal: Normal range of motion.  Neurological: She is alert and oriented to person, place, and time. She has normal strength. No cranial nerve deficit or sensory deficit. Coordination normal. GCS eye subscore is 4. GCS verbal subscore is 5. GCS motor subscore is 6.  Skin: Skin is warm, dry and intact. No rash noted. No cyanosis.  Psychiatric: She has a normal mood and affect. Her speech is normal and behavior is normal. Thought content normal.  Nursing note and vitals reviewed.   ED Course  Procedures (including critical care time) Labs Review Labs Reviewed  COMPREHENSIVE METABOLIC PANEL - Abnormal; Notable for the following:    Glucose, Bld 105 (*)    Albumin 3.3 (*)    GFR calc non Af Amer 86 (*)    All other components within normal limits  BRAIN NATRIURETIC PEPTIDE - Abnormal; Notable for the following:    B Natriuretic Peptide 1118.4 (*)    All other components within normal limits  CBC WITH DIFFERENTIAL  TROPONIN I    Imaging Review Dg Chest 2 View  01/08/2014   CLINICAL DATA:  Chest pain, shortness of breath  EXAM: CHEST  2 VIEW  COMPARISON:  12/21/2013  FINDINGS: There are bilateral interstitial and alveolar airspace opacities. There is no definite pleural effusion. There is no pneumothorax or focal consolidation. The heart and mediastinal contours are stable.  The osseous structures are unremarkable.  IMPRESSION: Findings most concerning for mild  pulmonary edema.   Electronically Signed   By: Elige Ko   On: 01/08/2014 14:11     EKG Interpretation   Date/Time:  Thursday January 08 2014 13:23:25 EST Ventricular Rate:  93 PR Interval:  132 QRS Duration: 142 QT Interval:  413 QTC Calculation: 514 R Axis:   94 Text Interpretation:  Sinus rhythm Prolonged QT interval Left bundle  branch block No significant change since last tracing Confirmed by Braedan Meuth   MD, Sherri Mcarthy [phone]) on 01/08/2014 1:27:37 PM      MDM   Final diagnoses:  Shortness of breath  Acute on chronic congestive heart failure, unspecified congestive heart failure type    Patient presents to the ER for evaluation of chest pain and shortness of breath. She reports that she has had progressive shortness of breath over the period of 1-2 weeks. She has been complaining of an aching pressure type pain over the left side of her chest as well. Patient does have a history of coronary artery disease, but has had a recent cardiac catheterization has shown no recurrent CAD. Stents are patent.  EKG does not show any significant change from prior. Troponin is negative. Patient does have an elevated BNP. Chest x-ray is consistent with pulmonary edema. This would explain the patient's current symptomatology. Patient administered Lasix. She reported minimal improvement of her chest pain with nitroglycerin from EMS. Patient administered morphine here for pain control. Will consult cardiology for further evaluation and treatment.    Gilda Crease, MD 01/08/14 [phone]

## 2014-01-09 ENCOUNTER — Encounter (HOSPITAL_COMMUNITY): Payer: Self-pay | Admitting: Cardiology

## 2014-01-09 DIAGNOSIS — I2510 Atherosclerotic heart disease of native coronary artery without angina pectoris: Secondary | ICD-10-CM

## 2014-01-09 DIAGNOSIS — R0789 Other chest pain: Secondary | ICD-10-CM

## 2014-01-09 DIAGNOSIS — I059 Rheumatic mitral valve disease, unspecified: Secondary | ICD-10-CM

## 2014-01-09 DIAGNOSIS — I509 Heart failure, unspecified: Secondary | ICD-10-CM | POA: Insufficient documentation

## 2014-01-09 DIAGNOSIS — R072 Precordial pain: Secondary | ICD-10-CM

## 2014-01-09 HISTORY — DX: Other chest pain: R07.89

## 2014-01-09 LAB — BASIC METABOLIC PANEL
Anion gap: 9 (ref 5–15)
BUN: 12 mg/dL (ref 6–23)
CALCIUM: 8.5 mg/dL (ref 8.4–10.5)
CHLORIDE: 103 meq/L (ref 96–112)
CO2: 27 mmol/L (ref 19–32)
Creatinine, Ser: 0.98 mg/dL (ref 0.50–1.10)
GFR calc Af Amer: 78 mL/min — ABNORMAL LOW (ref >90)
GFR calc non Af Amer: 67 mL/min — ABNORMAL LOW (ref >90)
GLUCOSE: 93 mg/dL (ref 70–99)
POTASSIUM: 3.4 mmol/L — AB (ref 3.5–5.1)
SODIUM: 139 mmol/L (ref 135–145)

## 2014-01-09 LAB — CBC
HCT: 38.6 % (ref 36.0–46.0)
Hemoglobin: 12.2 g/dL (ref 12.0–15.0)
MCH: 28.8 pg (ref 26.0–34.0)
MCHC: 31.6 g/dL (ref 30.0–36.0)
MCV: 91 fL (ref 78.0–100.0)
PLATELETS: 177 10*3/uL (ref 150–400)
RBC: 4.24 MIL/uL (ref 3.87–5.11)
RDW: 15.1 % (ref 11.5–15.5)
WBC: 5.6 10*3/uL (ref 4.0–10.5)

## 2014-01-09 LAB — TROPONIN I
Troponin I: <0.03 ng/mL (ref <0.031)
Troponin I: <0.03 ng/mL (ref <0.031)

## 2014-01-09 MED ORDER — METHOCARBAMOL 500 MG PO TABS: 1000.0000 mg | ORAL_TABLET | Freq: Three times a day (TID) | ORAL | Status: DC | PRN

## 2014-01-09 MED ORDER — ALBUTEROL SULFATE (2.5 MG/3ML) 0.083% IN NEBU: 2.5000 mg | INHALATION_SOLUTION | RESPIRATORY_TRACT | Status: DC | PRN

## 2014-01-09 MED ORDER — POTASSIUM CHLORIDE CRYS ER 20 MEQ PO TBCR
40.0000 meq | EXTENDED_RELEASE_TABLET | Freq: Once | ORAL | Status: AC
  Administered 2014-01-09: 40 meq via ORAL
  Filled 2014-01-09: qty 2

## 2014-01-09 MED ORDER — IPRATROPIUM-ALBUTEROL 0.5-2.5 (3) MG/3ML IN SOLN
3.0000 mL | RESPIRATORY_TRACT | Status: DC
  Administered 2014-01-09 (×2): 3 mL via RESPIRATORY_TRACT
  Filled 2014-01-09 (×2): qty 3

## 2014-01-09 MED ORDER — POTASSIUM CHLORIDE ER 10 MEQ PO TBCR: 10.0000 meq | EXTENDED_RELEASE_TABLET | Freq: Every day | ORAL | Status: DC

## 2014-01-09 MED ORDER — IBUPROFEN 400 MG PO TABS: 400.0000 mg | ORAL_TABLET | Freq: Three times a day (TID) | ORAL | Status: DC

## 2014-01-09 MED ORDER — FUROSEMIDE 20 MG PO TABS: 20.0000 mg | ORAL_TABLET | Freq: Every day | ORAL | Status: DC

## 2014-01-09 NOTE — Progress Notes (Signed)
Subjective: Patient denies SOB  CP with deep inspiration Objective: Filed Vitals:   01/09/14 0850 01/09/14 0917 01/09/14 0936 01/09/14 0957  BP: 85/64 100/69 99/79 94/65   Pulse: 79 84 85 88  Temp:      TempSrc:      Resp: Height:      Weight:      SpO2: 95% 94% 94% 93%   Weight change:   Intake/Output Summary (Last 24 hours) at 01/09/14 1023 Last data filed at 01/09/14 1610  Gross per 24 hour  Intake    120 ml  Output    250 ml  Net   -130 ml    General: Alert, awake, oriented x3, in no acute distress Neck:  JVP is normal Heart: Regular rate and rhythm, without murmurs, rubs, gallops.  Chest Tender Lungs: Clear to auscultation.  No rales or wheezes. Exemities:  No edema.   Neuro: Grossly intact, nonfocal.   Lab Results: Results for orders placed or performed during the hospital encounter of 01/08/14 (from the past 24 hour(s))  CBC with Differential     Status: None   Collection Time: 01/08/14  2:09 PM  Result Value Ref Range   WBC 6.1 4.0 - 10.5 K/uL   RBC 4.44 3.87 - 5.11 MIL/uL   Hemoglobin 13.1 12.0 - 15.0 g/dL   HCT 96.0 45.4 - 09.8 %   MCV 89.9 78.0 - 100.0 fL   MCH 29.5 26.0 - 34.0 pg   MCHC 32.8 30.0 - 36.0 g/dL   RDW 11.9 14.7 - 82.9 %   Platelets 193 150 - 400 K/uL   Neutrophils Relative % 66 43 - 77 %   Neutro Abs 4.0 1.7 - 7.7 K/uL   Lymphocytes Relative 27 12 - 46 %   Lymphs Abs 1.6 0.7 - 4.0 K/uL   Monocytes Relative 6 3 - 12 %   Monocytes Absolute 0.4 0.1 - 1.0 K/uL   Eosinophils Relative 1 0 - 5 %   Eosinophils Absolute 0.1 0.0 - 0.7 K/uL   Basophils Relative 0 0 - 1 %   Basophils Absolute 0.0 0.0 - 0.1 K/uL  Comprehensive metabolic panel     Status: Abnormal   Collection Time: 01/08/14  2:09 PM  Result Value Ref Range   Sodium 141 135 - 145 mmol/L   Potassium 3.9 3.5 - 5.1 mmol/L   Chloride 109 96 - 112 mEq/L   CO2 25 19 - 32 mmol/L   Glucose, Bld 105 (H) 70 - 99 mg/dL   BUN 9 6 - 23 mg/dL   Creatinine, Ser 5.62 0.50 -  1.10 mg/dL   Calcium 8.4 8.4 - 13.0 mg/dL   Total Protein 6.5 6.0 - 8.3 g/dL   Albumin 3.3 (L) 3.5 - 5.2 g/dL   AST 21 0 - 37 U/L   ALT 16 0 - 35 U/L   Alkaline Phosphatase 70 39 - 117 U/L   Total Bilirubin 0.7 0.3 - 1.2 mg/dL   GFR calc non Af Amer 86 (L) >90 mL/min   GFR calc Af Amer >90 >90 mL/min   Anion gap 7 5 - 15  Troponin I     Status: None   Collection Time: 01/08/14  2:09 PM  Result Value Ref Range   Troponin I <0.03 <0.031 ng/mL  Brain natriuretic peptide     Status: Abnormal   Collection Time: 01/08/14  2:09 PM  Result Value Ref Range   B Natriuretic Peptide 1118.4 (  H) 0.0 - 100.0 pg/mL  MRSA PCR Screening     Status: None   Collection Time: 01/08/14  8:05 PM  Result Value Ref Range   MRSA by PCR NEGATIVE NEGATIVE  Magnesium     Status: None   Collection Time: 01/08/14  8:43 PM  Result Value Ref Range   Magnesium 1.7 1.5 - 2.5 mg/dL  TSH     Status: None   Collection Time: 01/08/14  8:43 PM  Result Value Ref Range   TSH 1.449 0.350 - 4.500 uIU/mL  Protime-INR     Status: None   Collection Time: 01/08/14  8:43 PM  Result Value Ref Range   Prothrombin Time 14.4 11.6 - 15.2 seconds   INR 1.10 0.00 - 1.49  Troponin I     Status: None   Collection Time: 01/08/14  8:43 PM  Result Value Ref Range   Troponin I <0.03 <0.031 ng/mL  Troponin I     Status: None   Collection Time: 01/09/14  1:38 AM  Result Value Ref Range   Troponin I <0.03 <0.031 ng/mL  Troponin I     Status: None   Collection Time: 01/09/14  7:51 AM  Result Value Ref Range   Troponin I <0.03 <0.031 ng/mL  Basic metabolic panel     Status: Abnormal   Collection Time: 01/09/14  7:51 AM  Result Value Ref Range   Sodium 139 135 - 145 mmol/L   Potassium 3.4 (L) 3.5 - 5.1 mmol/L   Chloride 103 96 - 112 mEq/L   CO2 27 19 - 32 mmol/L   Glucose, Bld 93 70 - 99 mg/dL   BUN 12 6 - 23 mg/dL   Creatinine, Ser 9.20 0.50 - 1.10 mg/dL   Calcium 8.5 8.4 - 10.0 mg/dL   GFR calc non Af Amer 67 (L) >90 mL/min    GFR calc Af Amer 78 (L) >90 mL/min   Anion gap 9 5 - 15  CBC     Status: None   Collection Time: 01/09/14  7:51 AM  Result Value Ref Range   WBC 5.6 4.0 - 10.5 K/uL   RBC 4.24 3.87 - 5.11 MIL/uL   Hemoglobin 12.2 12.0 - 15.0 g/dL   HCT 71.2 19.7 - 58.8 %   MCV 91.0 78.0 - 100.0 fL   MCH 28.8 26.0 - 34.0 pg   MCHC 31.6 30.0 - 36.0 g/dL   RDW 32.5 49.8 - 26.4 %   Platelets 177 150 - 400 K/uL    Studies/Results: Dg Chest 2 View  01/08/2014   CLINICAL DATA:  Chest pain, shortness of breath  EXAM: CHEST  2 VIEW  COMPARISON:  12/21/2013  FINDINGS: There are bilateral interstitial and alveolar airspace opacities. There is no definite pleural effusion. There is no pneumothorax or focal consolidation. The heart and mediastinal contours are stable.  The osseous structures are unremarkable.  IMPRESSION: Findings most concerning for mild pulmonary edema.   Electronically Signed   By: Elige Ko   On: 01/08/2014 14:11    Medications: Reviewed   @PROBHOSP @  1.  CP  Atypical  Has r/o for MI  Appears more musculoskel    2  Dyspnea  Teated with IV lasix yesterday   3  .  CAD  Lastcath  April 2015 with patent stent  Procedure complicated by retroperitoneal hematoma req surgery   4.  LBBB  Chronic  5.  Tob abuse  Counselled on cessation.    6  HL  Statin      LOS: 1 day   Dietrich Pates 01/09/2014, 10:23 AM

## 2014-01-09 NOTE — Discharge Summary (Signed)
Physician Discharge Summary       Patient ID: Lindsay Camacho MRN: [id number] DOB/AGE: 02-16-1965 49 y.o.  Admit date: 01/08/2014 Discharge date: 01/09/2014   Primary Cardiologist:Dr. Irish Lack   Discharge Diagnoses:  Principal Problem:   Chest pain, atypical, muscular skelatal   Active Problems:   CAD - CFX DES 9/62/22   Diastolic CHF, acute   HTN (hypertension)   LBBB (left bundle branch block)   Tobacco abuse   Cardiomyopathy, ischemic- EF 40-45% by echo 01/18/13, improved in 04/2013 to 50-55%   Hyperlipidemia with target LDL less than 70   Discharged Condition: good  Procedures: none  Hospital Course: 49 y.o. female who had a lateral wall MI 01/2013, had a DES to the circumflex. Had been doing well except withSHOB with exertion. She continues to smoke, although les than before now half a pk, down from whole pk.    She had a repeat cath in April 2015 for chest pain with patent stent in LCX. EF 50%,unfortunately, she had a retroperitoneal bleed. She had emergent surgery with evacuation of rt retroperitoneal hematoma and repair of rt external iliac artery. Last Echo 04/2013 EF 50-55% with grade 2 diastolic dysfunction. Mild MR and AR.  Pt presented to ER 01/08/14 secondary to waking with chest pain, Lt chest and around into back. + diaphoresis and SOB no nausea. Some dizziness today. SHe took NTG at home with some relief but it did return. Now in ER has had 40 IV lasix and is receiving second dose of Morphine, still with pain. Pro BNP elevated. CXR mild pul. Edema. EKG with LBBB and no acute changes. LBBB old.   She was seen in [place] ER in Dec. And given a pill for elevated heart pressures. No prescription given.  Admitted with CHF and chest pain.  Chest wall tender to touch.  Pt placed on Robaxin and toradol and prn pain meds.  She was given IV lasix in ER and has been discharged on 20 mg daily.  Her troponin levels were negative.  She was seen and evaluated by Dr. Harrington Challenger  and found stable for discharge.  She had echo ordered and we had this done before discharge for pt convience.  RESULTS PENDING.    She has follow up with Dr. Cherlynn Kaiser on Monday- will ask her to keep that appt., pt does not yet have a PCP.  This may help prevent re admit.  She was given work note for Thursday Friday and Monday.    Consults: None  Significant Diagnostic Studies:  BMET    Component Value Date/Time   NA 139 01/09/2014 0751   K 3.4* 01/09/2014 0751   CL 103 01/09/2014 0751   CO2 27 01/09/2014 0751   GLUCOSE 93 01/09/2014 0751   BUN 12 01/09/2014 0751   CREATININE 0.98 01/09/2014 0751   CALCIUM 8.5 01/09/2014 0751   GFRNONAA 67* 01/09/2014 0751   GFRAA 78* 01/09/2014 0751     CBC    Component Value Date/Time   WBC 5.6 01/09/2014 0751   RBC 4.24 01/09/2014 0751   HGB 12.2 01/09/2014 0751   HCT 38.6 01/09/2014 0751   PLT 177 01/09/2014 0751   MCV 91.0 01/09/2014 0751   MCH 28.8 01/09/2014 0751   MCHC 31.6 01/09/2014 0751   RDW 15.1 01/09/2014 0751   LYMPHSABS 1.6 01/08/2014 1409   MONOABS 0.4 01/08/2014 1409   EOSABS 0.1 01/08/2014 1409   BASOSABS 0.0 01/08/2014 1409    Hepatic Function Latest Ref Rng  01/08/2014 11/15/2013 05/27/2013  Total Protein 6.0 - 8.3 g/dL 6.5 7.6 6.5  Albumin 3.5 - 5.2 g/dL 3.3(L) 3.9 3.3(L)  AST 0 - 37 U/L $Remo'21 17 18  'BpHkj$ ALT 0 - 35 U/L $Remo'16 9 12  'KEzGK$ Alk Phosphatase 39 - 117 U/L 70 83 66  Total Bilirubin 0.3 - 1.2 mg/dL 0.7 0.5 0.4  Bilirubin, Direct 0.0 - 0.3 mg/dL - - 0.0   Troponin <0.03 X 4  BNP on admit 1118   TSH 1.449  CHEST 2 VIEW COMPARISON: 12/21/2013 FINDINGS: There are bilateral interstitial and alveolar airspace opacities. There is no definite pleural effusion. There is no pneumothorax or focal consolidation. The heart and mediastinal contours are stable. The osseous structures are unremarkable. IMPRESSION: Findings most concerning for mild pulmonary edema.    Discharge Exam: Blood pressure 94/65, pulse 88,  temperature 98 F (36.7 C), temperature source Oral, resp. rate 16, height $RemoveBe'5\' 1"'VvAirIGeQ$  (1.549 m), weight 111 lb 4.6 oz (50.48 kg), SpO2 93 %.    Disposition: 01-Home or Self Care     Medication List    TAKE these medications        acetaminophen 325 MG tablet  Commonly known as:  TYLENOL  Take 325 mg by mouth every 6 (six) hours as needed for moderate pain.     aspirin 81 MG tablet  Take 1 tablet (81 mg total) by mouth daily.     clopidogrel 75 MG tablet  Commonly known as:  PLAVIX  Take 1 tablet (75 mg total) by mouth daily with breakfast.     furosemide 20 MG tablet  Commonly known as:  LASIX  Take 1 tablet (20 mg total) by mouth daily.     ibuprofen 400 MG tablet  Commonly known as:  ADVIL,MOTRIN  Take 1 tablet (400 mg total) by mouth 3 (three) times daily. For 2 days then use as needed three times a day for chest wall pain.     isosorbide mononitrate 30 MG 24 hr tablet  Commonly known as:  IMDUR  Take 1 tablet (30 mg total) by mouth daily.     methocarbamol 500 MG tablet  Commonly known as:  ROBAXIN  Take 2 tablets (1,000 mg total) by mouth every 8 (eight) hours as needed for muscle spasms.     nitroGLYCERIN 0.4 MG SL tablet  Commonly known as:  NITROSTAT  Place 1 tablet (0.4 mg total) under the tongue every 5 (five) minutes x 3 doses as needed for chest pain.     potassium chloride 10 MEQ tablet  Commonly known as:  K-DUR  Take 1 tablet (10 mEq total) by mouth daily.     rosuvastatin 10 MG tablet  Commonly known as:  CRESTOR  Take 10 mg by mouth daily.       Follow-up Information    Follow up On 01/12/2014.   Why:  at 11:30 AM       Discharge Instructions:  Heart Healthy diet  No lifting over the weekend to allow chest wall to improve  May return to work on Monday.  Call if further problems, better to call the office for an appointment than come to ER if possible.  Stop smoking.  Very important.  SignedIsaiah Serge Nurse  Practitioner-Certified  Group: [facility] 01/09/2014, 12:23 PM  Time spent on discharge : > 30 minutes.

## 2014-01-09 NOTE — Progress Notes (Signed)
Echocardiogram 2D Echocardiogram has been performed.  Dorothey Baseman 01/09/2014, 1:04 PM

## 2014-01-09 NOTE — Discharge Instructions (Signed)
Heart Healthy diet  No lifting over the weekend to allow chest wall to improve  May return to work on Monday.  Call if further problems, better to call the office for an appointment than come to ER if possible.  Stop smoking.  Very important.   Heart Failure Heart failure means your heart has trouble pumping blood. This makes it hard for your body to work well. Heart failure is usually a long-term (chronic) condition. You must take good care of yourself and follow your doctor's treatment plan. HOME CARE  Take your heart medicine as told by your doctor.  Do not stop taking medicine unless your doctor tells you to.  Do not skip any dose of medicine.  Refill your medicines before they run out.  Take other medicines only as told by your doctor or pharmacist.  Stay active if told by your doctor. The elderly and people with severe heart failure should talk with a doctor about physical activity.  Eat heart-healthy foods. Choose foods that are without trans fat and are low in saturated fat, cholesterol, and salt (sodium). This includes fresh or frozen fruits and vegetables, fish, lean meats, fat-free or low-fat dairy foods, whole grains, and high-fiber foods. Lentils and dried peas and beans (legumes) are also good choices.  Limit salt if told by your doctor.  Cook in a healthy way. Roast, grill, broil, bake, poach, steam, or stir-fry foods.  Limit fluids as told by your doctor.  Weigh yourself every morning. Do this after you pee (urinate) and before you eat breakfast. Write down your weight to give to your doctor.  Take your blood pressure and write it down if your doctor tells you to.  Ask your doctor how to check your pulse. Check your pulse as told.  Lose weight if told by your doctor.  Stop smoking or chewing tobacco. Do not use gum or patches that help you quit without your doctor's approval.  Schedule and go to doctor visits as told.  Nonpregnant women should have no  more than 1 drink a day. Men should have no more than 2 drinks a day. Talk to your doctor about drinking alcohol.  Stop illegal drug use.  Stay current with shots (immunizations).  Manage your health conditions as told by your doctor.  Learn to manage your stress.  Rest when you are tired.  If it is really hot outside:  Avoid intense activities.  Use air conditioning or fans, or get in a cooler place.  Avoid caffeine and alcohol.  Wear loose-fitting, lightweight, and light-colored clothing.  If it is really cold outside:  Avoid intense activities.  Layer your clothing.  Wear mittens or gloves, a hat, and a scarf when going outside.  Avoid alcohol.  Learn about heart failure and get support as needed.  Get help to maintain or improve your quality of life and your ability to care for yourself as needed. GET HELP IF:   You gain 03 lb/1.4 kg or more in 1 day or 05 lb/2.3 kg in a week.  You are more short of breath than usual.  You cannot do your normal activities.  You tire easily.  You cough more than normal, especially with activity.  You have any or more puffiness (swelling) in areas such as your hands, feet, ankles, or belly (abdomen).  You cannot sleep because it is hard to breathe.  You feel like your heart is beating fast (palpitations).  You get dizzy or light-headed when you stand  up. GET HELP RIGHT AWAY IF:   You have trouble breathing.  There is a change in mental status, such as becoming less alert or not being able to focus.  You have chest pain or discomfort.  You faint. MAKE SURE YOU:   Understand these instructions.  Will watch your condition.  Will get help right away if you are not doing well or get worse. Document Released: 09/28/2007 Document Revised: 05/05/2013 Document Reviewed: 02/05/2012 [facility] Patient Information 2015 [place], [place]. This information is not intended to replace advice given to you by your health care  provider. Make sure you discuss any questions you have with your health care provider.

## 2014-01-09 NOTE — Progress Notes (Signed)
UR Completed Fatih Stalvey Graves-Bigelow, RN,BSN [phone]  

## 2014-01-12 ENCOUNTER — Encounter: Payer: Self-pay | Admitting: Interventional Cardiology

## 2014-01-12 ENCOUNTER — Ambulatory Visit (INDEPENDENT_AMBULATORY_CARE_PROVIDER_SITE_OTHER): Payer: BLUE CROSS/BLUE SHIELD | Admitting: Interventional Cardiology

## 2014-01-12 VITALS — BP 110/80 | HR 88 | Ht 61.0 in | Wt 113.0 lb

## 2014-01-12 DIAGNOSIS — I5042 Chronic combined systolic (congestive) and diastolic (congestive) heart failure: Secondary | ICD-10-CM | POA: Insufficient documentation

## 2014-01-12 DIAGNOSIS — I5032 Chronic diastolic (congestive) heart failure: Secondary | ICD-10-CM

## 2014-01-12 DIAGNOSIS — I2510 Atherosclerotic heart disease of native coronary artery without angina pectoris: Secondary | ICD-10-CM

## 2014-01-12 DIAGNOSIS — I5022 Chronic systolic (congestive) heart failure: Secondary | ICD-10-CM | POA: Insufficient documentation

## 2014-01-12 MED ORDER — FUROSEMIDE 20 MG PO TABS: ORAL_TABLET | ORAL | Status: DC

## 2014-01-12 NOTE — Patient Instructions (Signed)
Your physician has recommended you make the following change in your medication:  1) You may take one extra Furosemide 20mg  (Lasix) daily for shortness of breath or fluid overload.  Your physician recommends that you purchase a scale and weigh yourself daily.  If you have a 3 pound increase in weight in 24 hours or 5 pounds in a week then call our office.  Your physician wants you to follow-up in: 6 months with Dr. Eldridge Dace. You will receive a reminder letter in the mail two months in advance. If you don't receive a letter, please call our office to schedule the follow-up appointment.

## 2014-01-12 NOTE — Progress Notes (Signed)
Patient ID: Lindsay Camacho, female   DOB: 10/27/1965, 49 y.o.   MRN: [id number]     [address] [place], [place]  [address] Phone: [phone] Fax:  [phone]  Date:  01/12/2014   ID:  Lindsay Camacho, DOB [birth year], MRN [id number]  PCP:  No PCP Per Patient      History of Present Illness: Lindsay Camacho is a 49 y.o. female who had a lateral wall MI.  She had a DES to the circumflex.  She has had some mild Cp related to turning a certain way.  No problems with walking other than SHOB with exertion.  She continues to smoke, although les than before.  She ended up coming to the hospital and ruled out for MI, but had an angiogram..  She had a repeat cath due to sx in April 2015.   unfortunately, she had a retroperitoneal bleed. She had emergent surgery. She has had significant pain since that time. It is improving. No further chest discomfort.  She was admitted in January 2016 with shortness of breath. She was found to be fluid overloaded. She has responded well to diuresis.  She has not weighed herself. She does try to avoid salt.  Wt Readings from Last 3 Encounters:  01/12/14 113 lb (51.256 kg)  01/09/14 111 lb 4.6 oz (50.48 kg)  05/27/13 130 lb (58.968 kg)     Past Medical History  Diagnosis Date  . Hypertension   . Stroke     tia with no deficits  . CAD (coronary artery disease) 11/13/2011    50% ? proximal LCx stenosis per Cath at [facility]  . PVD (peripheral vascular disease)     Right CFA stenosis per report on 11/14/2011  . HLD (hyperlipidemia)   . LBBB (left bundle branch block)   . Acute myocardial infarction of other lateral wall, initial episode of care 01/2013    DES CFX  . Intermediate coronary syndrome   . Chest pain, atypical, muscular skelatal   01/09/2014    Current Outpatient Prescriptions  Medication Sig Dispense Refill  . aspirin 81 MG tablet Take 1 tablet (81 mg total) by mouth daily. 30 tablet   . clopidogrel (PLAVIX) 75 MG tablet Take 1 tablet  (75 mg total) by mouth daily with breakfast. 90 tablet 3  . furosemide (LASIX) 20 MG tablet Take 1 tablet (20 mg total) by mouth daily. 30 tablet 6  . isosorbide mononitrate (IMDUR) 30 MG 24 hr tablet Take 1 tablet (30 mg total) by mouth daily. 30 tablet 11  . methocarbamol (ROBAXIN) 500 MG tablet Take 2 tablets (1,000 mg total) by mouth every 8 (eight) hours as needed for muscle spasms. 30 tablet 0  . potassium chloride (K-DUR) 10 MEQ tablet Take 1 tablet (10 mEq total) by mouth daily. 30 tablet 6  . rosuvastatin (CRESTOR) 10 MG tablet Take 10 mg by mouth daily.    Marland Kitchen acetaminophen (TYLENOL) 325 MG tablet Take 325 mg by mouth every 6 (six) hours as needed for moderate pain.    Marland Kitchen ibuprofen (ADVIL,MOTRIN) 400 MG tablet Take 1 tablet (400 mg total) by mouth 3 (three) times daily. For 2 days then use as needed three times a day for chest wall pain. (Patient not taking: Reported on 01/12/2014) 30 tablet 0  . nitroGLYCERIN (NITROSTAT) 0.4 MG SL tablet Place 1 tablet (0.4 mg total) under the tongue every 5 (five) minutes x 3 doses as needed for chest pain. (Patient  not taking: Reported on 01/12/2014) 25 tablet 6   No current facility-administered medications for this visit.    Allergies:    Allergies  Allergen Reactions  . Brilinta [Ticagrelor] Other (See Comments)    Dyspnea  . Other Other (See Comments)    BP med ? BP bottomed out  . Penicillins Rash    Social History:  The patient  reports that she has been smoking.  She has never used smokeless tobacco. She reports that she uses illicit drugs (Marijuana). She reports that she does not drink alcohol.   Family History:  The patient's family history includes Bladder Cancer in her mother; CAD (age of onset: 13) in her mother; CAD (age of onset: 46) in her father; Cancer in her mother; Heart attack in her father and mother; Heart disease in her father and mother; Hypertension in her mother; Lung cancer in her mother; Stroke in her father and mother.     ROS:  Please see the history of present illness.  No nausea, vomiting.  No fevers, chills.  No focal weakness.  No dysuria. Mild dizziness.    All other systems reviewed and negative.   PHYSICAL EXAM: VS:  BP 110/80 mmHg  Pulse 88  Ht  (1.549 m)  Wt 113 lb (51.256 kg)  BMI 21.36 kg/m2 Well nourished, well developed, in no acute distress HEENT: normal Neck: no JVD, no carotid bruits Cardiac:  normal S1, S2; RRR;  Lungs:  clear to auscultation bilaterally, no wheezing, rhonchi or rales Abd: soft, nontender, no hepatomegaly Ext: no edema, area that is open in the groin incision; 2+ right DP pulse Skin: warm and dry Neuro:   no focal abnormalities noted Psych: normal affect        ASSESSMENT AND PLAN:  1. Lateral wall MI/ diastolic heart failure: DES in circumflex.  Continue DAPT for at least a year.    No chest pains at this time.  No significant coronary artery disease noted at the time of repeat catheterization.  EF was 50-55% by echocardiogram.  No signs of fluid overload at this time. She has responded well to furosemide. I asked her to get a scale and weigh herself daily. If her weight goes up by more than 3 pounds, she should contact us. I've given her the flexibility to also take an extra Lasix 20 mg tablet if she feels more short of breath.  Avoid excessive salt intake. 2. HTN:   Several low blood pressure readings cause [place] to stop her antihypertensive medicine. She has had significant high readings in the past. Follow blood pressure at home.  Readings at home have been <120 mm Hg.  the low blood pressure readings are why she is not taking either an Ace or beta blocker. 3. Tobacco abuse: She needs to stop smoking.  She has cut back. 4.   Hyperlipidemia: Tolerating Crestor. LDL was 140 at the time of her MI.  LDL 77 in May 2015. 5.  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 She requested femoral approach versus radial approach due to spasm and she had a right arm. Given  this bleeding issues, if cath was needed in the future, would insist on right radial approach. Would have to use 5 French catheters to deal with the spasm but this can be done successfully.  Signed, Fredric Mare, MD, [facility] 01/12/2014 12:32 PM

## 2014-01-28 ENCOUNTER — Other Ambulatory Visit: Payer: Self-pay | Admitting: *Deleted

## 2014-01-28 MED ORDER — FUROSEMIDE 20 MG PO TABS: ORAL_TABLET | ORAL | Status: DC

## 2014-01-29 ENCOUNTER — Encounter: Payer: Self-pay | Admitting: *Deleted

## 2014-01-29 ENCOUNTER — Telehealth: Payer: Self-pay | Admitting: Interventional Cardiology

## 2014-01-29 ENCOUNTER — Other Ambulatory Visit: Payer: Self-pay | Admitting: *Deleted

## 2014-01-29 DIAGNOSIS — I340 Nonrheumatic mitral (valve) insufficiency: Secondary | ICD-10-CM

## 2014-01-29 NOTE — Telephone Encounter (Signed)
Follow Up ° °Pt returned call/sr  °

## 2014-01-29 NOTE — Telephone Encounter (Signed)
I spoke with the patient. 

## 2014-02-06 ENCOUNTER — Other Ambulatory Visit (INDEPENDENT_AMBULATORY_CARE_PROVIDER_SITE_OTHER): Payer: BLUE CROSS/BLUE SHIELD | Admitting: *Deleted

## 2014-02-06 DIAGNOSIS — I340 Nonrheumatic mitral (valve) insufficiency: Secondary | ICD-10-CM

## 2014-02-06 LAB — CBC WITH DIFFERENTIAL/PLATELET
BASOS ABS: 0 10*3/uL (ref 0.0–0.1)
BASOS PCT: 0.6 % (ref 0.0–3.0)
EOS PCT: 2.6 % (ref 0.0–5.0)
Eosinophils Absolute: 0.2 10*3/uL (ref 0.0–0.7)
HCT: 38.5 % (ref 36.0–46.0)
HEMOGLOBIN: 12.8 g/dL (ref 12.0–15.0)
Lymphocytes Relative: 32.2 % (ref 12.0–46.0)
Lymphs Abs: 2.2 10*3/uL (ref 0.7–4.0)
MCHC: 33.2 g/dL (ref 30.0–36.0)
MCV: 85.7 fl (ref 78.0–100.0)
Monocytes Absolute: 0.4 10*3/uL (ref 0.1–1.0)
Monocytes Relative: 6.5 % (ref 3.0–12.0)
NEUTROS ABS: 3.9 10*3/uL (ref 1.4–7.7)
Neutrophils Relative %: 58.1 % (ref 43.0–77.0)
PLATELETS: 164 10*3/uL (ref 150.0–400.0)
RBC: 4.49 Mil/uL (ref 3.87–5.11)
RDW: 15.2 % (ref 11.5–15.5)
WBC: 6.8 10*3/uL (ref 4.0–10.5)

## 2014-02-06 LAB — BASIC METABOLIC PANEL
BUN: 11 mg/dL (ref 6–23)
CHLORIDE: 109 meq/L (ref 96–112)
CO2: 26 mEq/L (ref 19–32)
CREATININE: 0.8 mg/dL (ref 0.40–1.20)
Calcium: 8.6 mg/dL (ref 8.4–10.5)
GFR: 81.1 mL/min (ref >60.00)
GLUCOSE: 78 mg/dL (ref 70–99)
Potassium: 3.7 mEq/L (ref 3.5–5.1)
Sodium: 141 mEq/L (ref 135–145)

## 2014-02-12 ENCOUNTER — Encounter (HOSPITAL_COMMUNITY): Payer: Self-pay | Admitting: *Deleted

## 2014-02-12 ENCOUNTER — Encounter (HOSPITAL_COMMUNITY)
Admission: RE | Disposition: A | Discharge status: Home / Self Care | Payer: Self-pay | Source: Ambulatory Visit | Attending: Cardiology

## 2014-02-12 ENCOUNTER — Ambulatory Visit (HOSPITAL_COMMUNITY)
Admission: RE | Admit: 2014-02-12 | Discharge: 2014-02-12 | Disposition: A | Discharge status: Home / Self Care | Payer: BLUE CROSS/BLUE SHIELD | Source: Ambulatory Visit | Attending: Cardiology | Admitting: Cardiology

## 2014-02-12 DIAGNOSIS — Z538 Procedure and treatment not carried out for other reasons: Secondary | ICD-10-CM | POA: Diagnosis not present

## 2014-02-12 DIAGNOSIS — I340 Nonrheumatic mitral (valve) insufficiency: Secondary | ICD-10-CM | POA: Insufficient documentation

## 2014-02-12 SURGERY — CANCELLED PROCEDURE

## 2014-02-12 MED ORDER — DIPHENHYDRAMINE HCL 50 MG/ML IJ SOLN
INTRAMUSCULAR | Status: AC
  Filled 2014-02-12: qty 1

## 2014-02-12 MED ORDER — SODIUM CHLORIDE 0.9 % IV SOLN
INTRAVENOUS | Status: DC
  Administered 2014-02-12: 500 mL via INTRAVENOUS

## 2014-02-12 MED ORDER — MIDAZOLAM HCL 5 MG/ML IJ SOLN
INTRAMUSCULAR | Status: AC
  Filled 2014-02-12: qty 2

## 2014-02-12 MED ORDER — FENTANYL CITRATE 0.05 MG/ML IJ SOLN
INTRAMUSCULAR | Status: AC
  Filled 2014-02-12: qty 2

## 2014-02-12 NOTE — H&P (Signed)
The patient presented  For the evaluation of new decreased LVEF and possibly severe MR.  Lindsay Camacho 02/12/2014

## 2014-02-12 NOTE — CV Procedure (Signed)
The procedure was rescheduled as the admitted to using drugs this morning and there is concern about sedation.   Lindsay Camacho 02/12/2014

## 2014-02-17 ENCOUNTER — Other Ambulatory Visit: Payer: Self-pay

## 2014-02-17 MED ORDER — ROSUVASTATIN CALCIUM 10 MG PO TABS: 10.0000 mg | ORAL_TABLET | Freq: Every day | ORAL | Status: DC

## 2014-02-17 MED ORDER — ISOSORBIDE MONONITRATE ER 30 MG PO TB24: 30.0000 mg | ORAL_TABLET | Freq: Every day | ORAL | Status: DC

## 2014-02-17 MED ORDER — CLOPIDOGREL BISULFATE 75 MG PO TABS: 75.0000 mg | ORAL_TABLET | Freq: Every day | ORAL | Status: DC

## 2014-03-06 ENCOUNTER — Encounter (HOSPITAL_COMMUNITY): Payer: Self-pay | Admitting: *Deleted

## 2014-03-06 ENCOUNTER — Ambulatory Visit (HOSPITAL_COMMUNITY)
Admission: RE | Admit: 2014-03-06 | Discharge: 2014-03-06 | Disposition: A | Discharge status: Home / Self Care | Payer: BLUE CROSS/BLUE SHIELD | Source: Ambulatory Visit | Attending: Cardiology | Admitting: Cardiology

## 2014-03-06 ENCOUNTER — Encounter (HOSPITAL_COMMUNITY)
Admission: RE | Disposition: A | Discharge status: Home / Self Care | Payer: Self-pay | Source: Ambulatory Visit | Attending: Cardiology

## 2014-03-06 DIAGNOSIS — I071 Rheumatic tricuspid insufficiency: Secondary | ICD-10-CM | POA: Diagnosis not present

## 2014-03-06 DIAGNOSIS — I340 Nonrheumatic mitral (valve) insufficiency: Secondary | ICD-10-CM | POA: Insufficient documentation

## 2014-03-06 DIAGNOSIS — I214 Non-ST elevation (NSTEMI) myocardial infarction: Secondary | ICD-10-CM

## 2014-03-06 DIAGNOSIS — I5031 Acute diastolic (congestive) heart failure: Secondary | ICD-10-CM

## 2014-03-06 DIAGNOSIS — I351 Nonrheumatic aortic (valve) insufficiency: Secondary | ICD-10-CM | POA: Diagnosis not present

## 2014-03-06 HISTORY — PX: TEE WITHOUT CARDIOVERSION: SHX5443

## 2014-03-06 SURGERY — ECHOCARDIOGRAM, TRANSESOPHAGEAL
Anesthesia: Moderate Sedation

## 2014-03-06 MED ORDER — FENTANYL CITRATE 0.05 MG/ML IJ SOLN
INTRAMUSCULAR | Status: DC | PRN
  Administered 2014-03-06 (×3): 25 ug via INTRAVENOUS

## 2014-03-06 MED ORDER — MIDAZOLAM HCL 10 MG/2ML IJ SOLN
INTRAMUSCULAR | Status: DC | PRN
  Administered 2014-03-06: 2 mg via INTRAVENOUS
  Administered 2014-03-06: 1 mg via INTRAVENOUS
  Administered 2014-03-06: 2 mg via INTRAVENOUS
  Administered 2014-03-06: 1 mg via INTRAVENOUS

## 2014-03-06 MED ORDER — FENTANYL CITRATE 0.05 MG/ML IJ SOLN
INTRAMUSCULAR | Status: AC
Start: 2014-03-06 — End: 2014-03-06
  Filled 2014-03-06: qty 2

## 2014-03-06 MED ORDER — MIDAZOLAM HCL 5 MG/ML IJ SOLN
INTRAMUSCULAR | Status: AC
  Filled 2014-03-06: qty 2

## 2014-03-06 MED ORDER — DIPHENHYDRAMINE HCL 50 MG/ML IJ SOLN
INTRAMUSCULAR | Status: AC
  Filled 2014-03-06: qty 1

## 2014-03-06 MED ORDER — BUTAMBEN-TETRACAINE-BENZOCAINE 2-2-14 % EX AERO
INHALATION_SPRAY | CUTANEOUS | Status: DC | PRN
  Administered 2014-03-06: 2 via TOPICAL

## 2014-03-06 MED ORDER — SODIUM CHLORIDE 0.9 % IV SOLN: INTRAVENOUS | Status: DC

## 2014-03-06 NOTE — Interval H&P Note (Signed)
History and Physical Interval Note:  03/06/2014 9:38 AM  Lindsay Camacho  has presented today for surgery, with the diagnosis of mitral regurgitation  The various methods of treatment have been discussed with the patient and family. After consideration of risks, benefits and other options for treatment, the patient has consented to  Procedure(s): TRANSESOPHAGEAL ECHOCARDIOGRAM (TEE) (N/A) as a surgical intervention .  The patient's history has been reviewed, patient examined, no change in status, stable for surgery.  I have reviewed the patient's chart and labs.  Questions were answered to the patient's satisfaction.     Lars Masson

## 2014-03-06 NOTE — CV Procedure (Signed)
     Transesophageal Echocardiogram Note  Lindsay Camacho [id number] 12/01/1965  Procedure: Transesophageal Echocardiogram Indications: Mitral regurgitation  Procedure Details Consent: Obtained Time Out: Verified patient identification, verified procedure, site/side was marked, verified correct patient position, special equipment/implants available, Radiology Safety Procedures followed,  medications/allergies/relevent history reviewed, required imaging and test results available.  Performed  Medications: Fentanyl: 75 mcg Versed: 4 mg  Left Ventrical:  LVEF 40-45%  Mitral Valve: moderate MR  Aortic Valve: MIld AI  Tricuspid Valve: Mild TR  Pulmonic Valve: normal   Left Atrium/ Left atrial appendage: no thrombus  Atrial septum: no PFO  Aorta: mild plague   Complications: No apparent complications Patient did tolerate procedure well.  Lars Masson, MD, [facility] 03/06/2014, 10:16 AM

## 2014-03-06 NOTE — Discharge Instructions (Signed)
Conscious Sedation, Adult, Care After °Refer to this sheet in the next few weeks. These instructions provide you with information on caring for yourself after your procedure. Your health care provider may also give you more specific instructions. Your treatment has been planned according to current medical practices, but problems sometimes occur. Call your health care provider if you have any problems or questions after your procedure. °WHAT TO EXPECT AFTER THE PROCEDURE  °After your procedure: °· You may feel sleepy, clumsy, and have poor balance for several hours. °· Vomiting may occur if you eat too soon after the procedure. °HOME CARE INSTRUCTIONS °· Do not participate in any activities where you could become injured for at least 24 hours. Do not: °¨ Drive. °¨ Swim. °¨ Ride a bicycle. °¨ Operate heavy machinery. °¨ Cook. °¨ Use power tools. °¨ Climb ladders. °¨ Work from a high place. °· Do not make important decisions or sign legal documents until you are improved. °· If you vomit, drink water, juice, or soup when you can drink without vomiting. Make sure you have little or no nausea before eating solid foods. °· Only take over-the-counter or prescription medicines for pain, discomfort, or fever as directed by your health care provider. °· Make sure you and your family fully understand everything about the medicines given to you, including what side effects may occur. °· You should not drink alcohol, take sleeping pills, or take medicines that cause drowsiness for at least 24 hours. °· If you smoke, do not smoke without supervision. °· If you are feeling better, you may resume normal activities 24 hours after you were sedated. °· Keep all appointments with your health care provider. °SEEK MEDICAL CARE IF: °· Your skin is pale or bluish in color. °· You continue to feel nauseous or vomit. °· Your pain is getting worse and is not helped by medicine. °· You have bleeding or swelling. °· You are still sleepy or  feeling clumsy after 24 hours. °SEEK IMMEDIATE MEDICAL CARE IF: °· You develop a rash. °· You have difficulty breathing. °· You develop any type of allergic problem. °· You have a fever. °MAKE SURE YOU: °· Understand these instructions. °· Will watch your condition. °· Will get help right away if you are not doing well or get worse. °Document Released: 10/09/2012 Document Reviewed: 10/09/2012 °ExitCare® Patient Information ©2015 [employer]. This information is not intended to replace advice given to you by your health care provider. Make sure you discuss any questions you have with your health care provider. °Transesophageal Echocardiogram °Transesophageal echocardiography (TEE) is a picture test of your heart using sound waves. The pictures taken can give very detailed pictures of your heart. This can help your doctor see if there are problems with your heart. TEE can check: °· If your heart has blood clots in it. °· How well your heart valves are working. °· If you have an infection on the inside of your heart. °· Some of the major arteries of your heart. °· If your heart valve is working after a repair. °· Your heart before a procedure that uses a shock to your heart to get the rhythm back to normal. °BEFORE THE PROCEDURE °· Do not eat or drink for 6 hours before the procedure or as told by your doctor. °· Make plans to have someone drive you home after the procedure. Do not drive yourself home. °· An IV tube will be put in your arm. °PROCEDURE °· You will be given a   medicine to help you relax (sedative). It will be given through the IV tube. °· A numbing medicine will be sprayed or gargled in the back of your throat to help numb it. °· The tip of the probe is placed into the back of your mouth. You will be asked to swallow. This helps to pass the probe into your esophagus. °· Once the tip of the probe is in the right place, your doctor can take pictures of your heart. °· You may feel pressure at the back of  your throat. °AFTER THE PROCEDURE °· You will be taken to a recovery area so the sedative can wear off. °· Your throat may be sore and scratchy. This will go away slowly over time. °· You will go home when you are fully awake and able to swallow liquids. °· You should have someone stay with you for the next 24 hours. °· Do not drive or operate machinery for the next 24 hours. °Document Released: 10/16/2008 Document Revised: 12/24/2012 Document Reviewed: 06/20/2012 °ExitCare® Patient Information ©2015 [employer]. This information is not intended to replace advice given to you by your health care provider. Make sure you discuss any questions you have with your health care provider. ° °

## 2014-03-06 NOTE — H&P (View-Only) (Signed)
The patient presented  For the evaluation of new decreased LVEF and possibly severe MR.  Lindsay Camacho H 02/12/2014  

## 2014-03-06 NOTE — Progress Notes (Signed)
  Echocardiogram Echocardiogram Transesophageal has been performed.  Leta Jungling M 03/06/2014, 11:41 AM

## 2014-03-09 ENCOUNTER — Emergency Department (HOSPITAL_COMMUNITY): Payer: BLUE CROSS/BLUE SHIELD

## 2014-03-09 ENCOUNTER — Telehealth: Payer: Self-pay | Admitting: Interventional Cardiology

## 2014-03-09 ENCOUNTER — Encounter (HOSPITAL_COMMUNITY): Payer: Self-pay | Admitting: Cardiology

## 2014-03-09 ENCOUNTER — Observation Stay (HOSPITAL_COMMUNITY)
Admission: EM | Admit: 2014-03-09 | Discharge: 2014-03-11 | Disposition: A | Discharge status: Home / Self Care | Payer: BLUE CROSS/BLUE SHIELD | Attending: Internal Medicine | Admitting: Internal Medicine

## 2014-03-09 DIAGNOSIS — E869 Volume depletion, unspecified: Secondary | ICD-10-CM | POA: Diagnosis not present

## 2014-03-09 DIAGNOSIS — Z888 Allergy status to other drugs, medicaments and biological substances status: Secondary | ICD-10-CM | POA: Insufficient documentation

## 2014-03-09 DIAGNOSIS — Z7902 Long term (current) use of antithrombotics/antiplatelets: Secondary | ICD-10-CM | POA: Insufficient documentation

## 2014-03-09 DIAGNOSIS — Z7982 Long term (current) use of aspirin: Secondary | ICD-10-CM | POA: Diagnosis not present

## 2014-03-09 DIAGNOSIS — I2510 Atherosclerotic heart disease of native coronary artery without angina pectoris: Secondary | ICD-10-CM | POA: Diagnosis not present

## 2014-03-09 DIAGNOSIS — I739 Peripheral vascular disease, unspecified: Secondary | ICD-10-CM | POA: Diagnosis not present

## 2014-03-09 DIAGNOSIS — Z9851 Tubal ligation status: Secondary | ICD-10-CM | POA: Diagnosis not present

## 2014-03-09 DIAGNOSIS — I252 Old myocardial infarction: Secondary | ICD-10-CM | POA: Diagnosis not present

## 2014-03-09 DIAGNOSIS — R55 Syncope and collapse: Secondary | ICD-10-CM | POA: Diagnosis present

## 2014-03-09 DIAGNOSIS — F17210 Nicotine dependence, cigarettes, uncomplicated: Secondary | ICD-10-CM | POA: Insufficient documentation

## 2014-03-09 DIAGNOSIS — E785 Hyperlipidemia, unspecified: Secondary | ICD-10-CM | POA: Insufficient documentation

## 2014-03-09 DIAGNOSIS — E876 Hypokalemia: Secondary | ICD-10-CM

## 2014-03-09 DIAGNOSIS — R0602 Shortness of breath: Secondary | ICD-10-CM

## 2014-03-09 DIAGNOSIS — F1210 Cannabis abuse, uncomplicated: Secondary | ICD-10-CM | POA: Diagnosis not present

## 2014-03-09 DIAGNOSIS — Z880 Allergy status to penicillin: Secondary | ICD-10-CM | POA: Insufficient documentation

## 2014-03-09 DIAGNOSIS — R0600 Dyspnea, unspecified: Secondary | ICD-10-CM | POA: Diagnosis not present

## 2014-03-09 DIAGNOSIS — Z955 Presence of coronary angioplasty implant and graft: Secondary | ICD-10-CM | POA: Insufficient documentation

## 2014-03-09 DIAGNOSIS — Z8673 Personal history of transient ischemic attack (TIA), and cerebral infarction without residual deficits: Secondary | ICD-10-CM | POA: Diagnosis not present

## 2014-03-09 DIAGNOSIS — I255 Ischemic cardiomyopathy: Secondary | ICD-10-CM | POA: Diagnosis present

## 2014-03-09 DIAGNOSIS — I10 Essential (primary) hypertension: Secondary | ICD-10-CM | POA: Diagnosis present

## 2014-03-09 DIAGNOSIS — Z9049 Acquired absence of other specified parts of digestive tract: Secondary | ICD-10-CM | POA: Insufficient documentation

## 2014-03-09 DIAGNOSIS — I214 Non-ST elevation (NSTEMI) myocardial infarction: Secondary | ICD-10-CM

## 2014-03-09 LAB — CBC WITH DIFFERENTIAL/PLATELET
Basophils Absolute: 0 10*3/uL (ref 0.0–0.1)
Basophils Relative: 0 % (ref 0–1)
Eosinophils Absolute: 0.2 10*3/uL (ref 0.0–0.7)
Eosinophils Relative: 2 % (ref 0–5)
HCT: 47.7 % — ABNORMAL HIGH (ref 36.0–46.0)
HEMOGLOBIN: 16 g/dL — AB (ref 12.0–15.0)
LYMPHS ABS: 2.2 10*3/uL (ref 0.7–4.0)
LYMPHS PCT: 26 % (ref 12–46)
MCH: 29 pg (ref 26.0–34.0)
MCHC: 33.5 g/dL (ref 30.0–36.0)
MCV: 86.4 fL (ref 78.0–100.0)
MONOS PCT: 8 % (ref 3–12)
Monocytes Absolute: 0.7 10*3/uL (ref 0.1–1.0)
NEUTROS ABS: 5.4 10*3/uL (ref 1.7–7.7)
NEUTROS PCT: 64 % (ref 43–77)
PLATELETS: 190 10*3/uL (ref 150–400)
RBC: 5.52 MIL/uL — AB (ref 3.87–5.11)
RDW: 14.4 % (ref 11.5–15.5)
WBC: 8.4 10*3/uL (ref 4.0–10.5)

## 2014-03-09 LAB — BASIC METABOLIC PANEL
Anion gap: 10 (ref 5–15)
BUN: 7 mg/dL (ref 6–23)
CO2: 25 mmol/L (ref 19–32)
Calcium: 9.1 mg/dL (ref 8.4–10.5)
Chloride: 102 mmol/L (ref 96–112)
Creatinine, Ser: 0.79 mg/dL (ref 0.50–1.10)
GFR calc Af Amer: >90 mL/min (ref >90)
GFR calc non Af Amer: >90 mL/min (ref >90)
Glucose, Bld: 83 mg/dL (ref 70–99)
Potassium: 3.3 mmol/L — ABNORMAL LOW (ref 3.5–5.1)
Sodium: 137 mmol/L (ref 135–145)

## 2014-03-09 LAB — CBG MONITORING, ED: GLUCOSE-CAPILLARY: 94 mg/dL (ref 70–99)

## 2014-03-09 LAB — TROPONIN I: Troponin I: <0.03 ng/mL (ref <0.031)

## 2014-03-09 LAB — POC URINE PREG, ED: PREG TEST UR: NEGATIVE

## 2014-03-09 LAB — I-STAT CG4 LACTIC ACID, ED: LACTIC ACID, VENOUS: 1.26 mmol/L (ref 0.5–2.0)

## 2014-03-09 MED ORDER — SODIUM CHLORIDE 0.9 % IJ SOLN
3.0000 mL | Freq: Two times a day (BID) | INTRAMUSCULAR | Status: DC
  Administered 2014-03-10 – 2014-03-11 (×4): 3 mL via INTRAVENOUS

## 2014-03-09 MED ORDER — POTASSIUM CHLORIDE CRYS ER 20 MEQ PO TBCR
40.0000 meq | EXTENDED_RELEASE_TABLET | Freq: Once | ORAL | Status: AC
  Administered 2014-03-09: 40 meq via ORAL
  Filled 2014-03-09: qty 2

## 2014-03-09 MED ORDER — POTASSIUM CHLORIDE CRYS ER 10 MEQ PO TBCR
10.0000 meq | EXTENDED_RELEASE_TABLET | Freq: Once | ORAL | Status: DC
  Filled 2014-03-09: qty 1

## 2014-03-09 MED ORDER — METHOCARBAMOL 500 MG PO TABS
1000.0000 mg | ORAL_TABLET | Freq: Three times a day (TID) | ORAL | Status: DC | PRN
  Filled 2014-03-09: qty 2

## 2014-03-09 MED ORDER — ACETAMINOPHEN 325 MG PO TABS
650.0000 mg | ORAL_TABLET | Freq: Four times a day (QID) | ORAL | Status: DC | PRN
  Administered 2014-03-10 – 2014-03-11 (×2): 650 mg via ORAL
  Filled 2014-03-09 (×3): qty 2

## 2014-03-09 MED ORDER — CLOPIDOGREL BISULFATE 75 MG PO TABS
75.0000 mg | ORAL_TABLET | Freq: Every day | ORAL | Status: DC
  Administered 2014-03-10 – 2014-03-11 (×2): 75 mg via ORAL
  Filled 2014-03-09 (×2): qty 1

## 2014-03-09 MED ORDER — ENOXAPARIN SODIUM 30 MG/0.3ML ~~LOC~~ SOLN
30.0000 mg | SUBCUTANEOUS | Status: DC
  Administered 2014-03-10 – 2014-03-11 (×2): 30 mg via SUBCUTANEOUS
  Filled 2014-03-09 (×2): qty 0.3

## 2014-03-09 MED ORDER — POTASSIUM CHLORIDE ER 10 MEQ PO TBCR
10.0000 meq | EXTENDED_RELEASE_TABLET | Freq: Every day | ORAL | Status: DC
  Administered 2014-03-10 – 2014-03-11 (×2): 10 meq via ORAL
  Filled 2014-03-09 (×2): qty 1

## 2014-03-09 MED ORDER — ONDANSETRON HCL 4 MG/2ML IJ SOLN: 4.0000 mg | Freq: Four times a day (QID) | INTRAMUSCULAR | Status: DC | PRN

## 2014-03-09 MED ORDER — ROSUVASTATIN CALCIUM 10 MG PO TABS
10.0000 mg | ORAL_TABLET | Freq: Every day | ORAL | Status: DC
  Administered 2014-03-10 – 2014-03-11 (×2): 10 mg via ORAL
  Filled 2014-03-09 (×2): qty 1

## 2014-03-09 MED ORDER — ASPIRIN 81 MG PO CHEW
81.0000 mg | CHEWABLE_TABLET | Freq: Once | ORAL | Status: AC
  Administered 2014-03-09: 81 mg via ORAL
  Filled 2014-03-09: qty 1

## 2014-03-09 MED ORDER — ONDANSETRON HCL 4 MG PO TABS: 4.0000 mg | ORAL_TABLET | Freq: Four times a day (QID) | ORAL | Status: DC | PRN

## 2014-03-09 MED ORDER — ISOSORBIDE MONONITRATE ER 30 MG PO TB24
30.0000 mg | ORAL_TABLET | Freq: Every day | ORAL | Status: DC
Start: 2014-03-10 — End: 2014-03-11
  Administered 2014-03-10 – 2014-03-11 (×2): 30 mg via ORAL
  Filled 2014-03-09 (×2): qty 1

## 2014-03-09 MED ORDER — NITROGLYCERIN 0.4 MG SL SUBL: 0.4000 mg | SUBLINGUAL_TABLET | SUBLINGUAL | Status: DC | PRN

## 2014-03-09 MED ORDER — ACETAMINOPHEN 650 MG RE SUPP: 650.0000 mg | Freq: Four times a day (QID) | RECTAL | Status: DC | PRN

## 2014-03-09 NOTE — ED Notes (Signed)
CBG level 94 mg/dl.

## 2014-03-09 NOTE — ED Notes (Signed)
Placed patient on bed pan, collected urine sample. 

## 2014-03-09 NOTE — Telephone Encounter (Signed)
error 

## 2014-03-09 NOTE — ED Provider Notes (Signed)
CSN: [id number]     Arrival date & time 03/09/14  1651 History   First MD Initiated Contact with Patient 03/09/14 1742     Chief Complaint  Patient presents with  . Loss of Consciousness     (Consider location/radiation/quality/duration/timing/severity/associated sxs/prior Treatment) Patient is a 49 y.o. female presenting with syncope. The history is provided by the patient.  Loss of Consciousness She states that she was at work when she started feeling lightheaded. She then noticed numbness in her face and her hands are dry mouth. The next thing she remembers, she was in an ambulance coming to the ED. She states that she still feels disoriented even though she knows where she is. She has been having a fairly constant, dull, chest pressure for the last several weeks. This is not affected by exertion or body position. She did have some dyspnea today with no nausea or diaphoresis. She had just been hospitalized a few days ago for transesophageal echocardiogram. She is status post coronary stent placement.  Past Medical History  Diagnosis Date  . Hypertension   . Stroke     tia with no deficits  . CAD (coronary artery disease) 11/13/2011    50% ? proximal LCx stenosis per Cath at [facility]  . PVD (peripheral vascular disease)     Right CFA stenosis per report on 11/14/2011  . HLD (hyperlipidemia)   . LBBB (left bundle branch block)   . Acute myocardial infarction of other lateral wall, initial episode of care 01/2013    DES CFX  . Intermediate coronary syndrome   . Chest pain, atypical, muscular skelatal   01/09/2014   Past Surgical History  Procedure Laterality Date  . Cardiac catheterization  04/14/2013    Non-obstructive disease, patent CFX stent  . Tubal ligation    . Appendectomy    . Groin debridement Right 04/14/2013    Procedure: Emergency Evacuation of Retroperitoneal Hematoma and Repair Right External Iliac Artery Pseudoaneurysm    ;  Surgeon: Sherren Kerns, MD;  Location: [facility] OR;   Service: Vascular;  Laterality: Right;  . Coronary angioplasty with stent placement  01/17/2013    mild disease except 99% CFX, rx with  2.5 x 28 Alpine drug-eluting stent   . Left heart catheterization with coronary angiogram N/A 01/18/2013    Procedure: LEFT HEART CATHETERIZATION WITH CORONARY ANGIOGRAM;  Surgeon: Corky Crafts, MD;  Location: [facility] CATH LAB;  Service: Cardiovascular;  Laterality: N/A;  . Percutaneous coronary stent intervention (pci-s)  01/18/2013    Procedure: PERCUTANEOUS CORONARY STENT INTERVENTION (PCI-S);  Surgeon: Corky Crafts, MD;  Location: [facility] CATH LAB;  Service: Cardiovascular;;  . Left heart catheterization with coronary angiogram N/A 04/14/2013    Procedure: LEFT HEART CATHETERIZATION WITH CORONARY ANGIOGRAM;  Surgeon: Corky Crafts, MD;  Location: [facility] CATH LAB;  Service: Cardiovascular;  Laterality: N/A;  . Tee without cardioversion N/A 03/06/2014    Procedure: TRANSESOPHAGEAL ECHOCARDIOGRAM (TEE);  Surgeon: Lars Masson, MD;  Location: [facility] ENDOSCOPY;  Service: Cardiovascular;  Laterality: N/A;   Family History  Problem Relation Age of Onset  . CAD Mother 39  . Lung cancer Mother   . Bladder Cancer Mother   . Stroke Mother   . Cancer Mother     Lung and Bladder  . Heart disease Mother     Before age 50  . Hypertension Mother   . Heart attack Mother   . CAD Father 67  . Heart disease Father  Before age 5  . Heart attack Father   . Stroke Father     Bleeding stroke   History  Substance Use Topics  . Smoking status: Current Every Day Smoker -- 0.50 packs/day  . Smokeless tobacco: Never Used  . Alcohol Use: No   OB History    No data available     Review of Systems  Cardiovascular: Positive for syncope.  All other systems reviewed and are negative.     Allergies  Brilinta; Other; and Penicillins  Home Medications   Prior to Admission medications   Medication Sig Start Date End Date Taking? Authorizing Provider   acetaminophen (TYLENOL) 325 MG tablet Take 325 mg by mouth every 6 (six) hours as needed for moderate pain.   Yes Historical Provider, MD  clopidogrel (PLAVIX) 75 MG tablet Take 1 tablet (75 mg total) by mouth daily with breakfast. 02/17/14  Yes Corky Crafts, MD  furosemide (LASIX) 20 MG tablet Take one tablet by mouth daily.  May take an extra 20 mg tablet daily as needed for shortness of breath or fluid overload. 01/28/14  Yes Corky Crafts, MD  ibuprofen (ADVIL,MOTRIN) 400 MG tablet Take 1 tablet (400 mg total) by mouth 3 (three) times daily. For 2 days then use as needed three times a day for chest wall pain. 01/09/14  Yes Leone Brand, NP  isosorbide mononitrate (IMDUR) 30 MG 24 hr tablet Take 1 tablet (30 mg total) by mouth daily. 02/17/14  Yes Corky Crafts, MD  methocarbamol (ROBAXIN) 500 MG tablet Take 2 tablets (1,000 mg total) by mouth every 8 (eight) hours as needed for muscle spasms. 01/09/14  Yes Leone Brand, NP  potassium chloride (K-DUR) 10 MEQ tablet Take 1 tablet (10 mEq total) by mouth daily. 01/09/14  Yes Leone Brand, NP  rosuvastatin (CRESTOR) 10 MG tablet Take 1 tablet (10 mg total) by mouth daily. 02/17/14  Yes Corky Crafts, MD  aspirin 81 MG tablet Take 1 tablet (81 mg total) by mouth daily. 10/08/12   Christiane Ha, MD  nitroGLYCERIN (NITROSTAT) 0.4 MG SL tablet Place 1 tablet (0.4 mg total) under the tongue every 5 (five) minutes x 3 doses as needed for chest pain. 10/27/13   Corky Crafts, MD   BP 134/72 mmHg  Pulse 103  Temp(Src) 97.8 F (36.6 C) (Oral)  Resp 16  Wt 112 lb 8 oz (51.03 kg)  SpO2 99% Physical Exam  Nursing note and vitals reviewed.  49 year old female, resting comfortably and in no acute distress. Vital signs are significant for borderline tachycardia. Oxygen saturation is 99%, which is normal. Head is normocephalic and atraumatic. PERRLA, EOMI. Oropharynx is clear. Neck is nontender and supple without adenopathy or  JVD. There are no carotid bruits. Back is nontender and there is no CVA tenderness. Lungs are clear without rales, wheezes, or rhonchi. Chest is nontender. Heart has regular rate and rhythm without murmur. Abdomen is soft, flat, nontender without masses or hepatosplenomegaly and peristalsis is normoactive. Extremities have no cyanosis or edema, full range of motion is present. Skin is warm and dry without rash. Neurologic: Mental status is normal, cranial nerves are intact, there are no motor or sensory deficits.  ED Course  Procedures (including critical care time) Labs Review Results for orders placed or performed during the hospital encounter of 03/09/14  Basic metabolic panel  Result Value Ref Range   Sodium 137 135 - 145 mmol/L   Potassium 3.3 (  L) 3.5 - 5.1 mmol/L   Chloride 102 96 - 112 mmol/L   CO2 25 19 - 32 mmol/L   Glucose, Bld 83 70 - 99 mg/dL   BUN 7 6 - 23 mg/dL   Creatinine, Ser 9.81 0.50 - 1.10 mg/dL   Calcium 9.1 8.4 - 19.1 mg/dL   GFR calc non Af Amer >90 >90 mL/min   GFR calc Af Amer >90 >90 mL/min   Anion gap 10 5 - 15  Troponin I  Result Value Ref Range   Troponin I <0.03 <0.031 ng/mL  CBC with Differential  Result Value Ref Range   WBC 8.4 4.0 - 10.5 K/uL   RBC 5.52 (H) 3.87 - 5.11 MIL/uL   Hemoglobin 16.0 (H) 12.0 - 15.0 g/dL   HCT 47.8 (H) 29.5 - 62.1 %   MCV 86.4 78.0 - 100.0 fL   MCH 29.0 26.0 - 34.0 pg   MCHC 33.5 30.0 - 36.0 g/dL   RDW 30.8 65.7 - 84.6 %   Platelets 190 150 - 400 K/uL   Neutrophils Relative % 64 43 - 77 %   Neutro Abs 5.4 1.7 - 7.7 K/uL   Lymphocytes Relative 26 12 - 46 %   Lymphs Abs 2.2 0.7 - 4.0 K/uL   Monocytes Relative 8 3 - 12 %   Monocytes Absolute 0.7 0.1 - 1.0 K/uL   Eosinophils Relative 2 0 - 5 %   Eosinophils Absolute 0.2 0.0 - 0.7 K/uL   Basophils Relative 0 0 - 1 %   Basophils Absolute 0.0 0.0 - 0.1 K/uL  CBG monitoring, ED  Result Value Ref Range   Glucose-Capillary 94 70 - 99 mg/dL  I-Stat CG4 Lactic  Acid, ED  Result Value Ref Range   Lactic Acid, Venous 1.26 0.5 - 2.0 mmol/L  POC Urine Pregnancy, ED (do NOT order at [facility])  Result Value Ref Range   Preg Test, Ur NEGATIVE NEGATIVE    Imaging Review Ct Head Wo Contrast  03/09/2014   CLINICAL DATA:  49 year old female with history of syncope.  EXAM: CT HEAD WITHOUT CONTRAST  TECHNIQUE: Contiguous axial images were obtained from the base of the skull through the vertex without intravenous contrast.  COMPARISON:  No priors.  FINDINGS: Small ill-defined focus of low attenuation in the right frontal lobe white matter immediately lateral to the head of the right caudate (image 13 of series 2), concerning for potential age-indeterminate ischemia. No acute intracranial abnormalities. Specifically, no evidence of acute intracranial hemorrhage, no mass, mass effect, hydrocephalus or abnormal intra or extra-axial fluid collections. Visualized paranasal sinuses and mastoids are well pneumatized. No acute displaced skull fractures are identified.  IMPRESSION: 1. Small focus of age-indeterminate ischemia in the right frontal lobe white matter. While this may be old, the possibility of subacute ischemia is not excluded, and this could be better delineated with the brain MRI with and without IV gadolinium if clinically appropriate. 2. No other potential acute findings. These results were called by telephone at the time of interpretation on 03/09/2014 at 7:29 pm to Dr. Dione Booze, who verbally acknowledged these results.   Electronically Signed   By: Trudie Reed M.D.   On: 03/09/2014 19:32   Mr Brain Wo Contrast  03/09/2014   CLINICAL DATA:  Woke up feeling weak, short of breath and disoriented. Syncopal episode after taking 2 extra Lasix pills today.  EXAM: MRI HEAD WITHOUT CONTRAST  TECHNIQUE: Multiplanar, multiecho pulse sequences of the brain and surrounding structures were  obtained without intravenous contrast.  COMPARISON:  CT of the head March 09, 2014 at 1912  hours  FINDINGS: Motion degraded examination, ranging from mild to moderate degraded sequences.  The ventricles and sulci are normal for patient's age. No mass lesions, mass effect. Subcentimeter focus of T2 bright signal in the RIGHT corona radiata corresponding to CT abnormality. Additional subtle areas of parenchymal signal abnormality may be undetected due to mild motion. No reduced diffusion to suggest acute ischemia. No susceptibility artifact to suggest hemorrhage. Possible LEFT medial parietal developmental venous anomaly. Dolicoectatic intracranial vessels suggests chronic hypertension.  No abnormal extra-axial fluid collections. No extra-axial masses though, contrast enhanced sequences would be more sensitive. Normal major intracranial vascular flow voids seen at the skull base.  Ocular globes and orbital contents are unremarkable though not tailored for evaluation. No abnormal sellar expansion. Visualized paranasal sinuses and mastoid air cells are well-aerated. No suspicious calvarial bone marrow signal. No abnormal sellar expansion. Craniocervical junction maintained.  IMPRESSION: Mild to moderately motion degraded examination.  No acute intracranial process, specifically no acute ischemia.  Sub cm RIGHT corona radiata lesion suggests remote ischemia, less likely demyelination.   Electronically Signed   By: Awilda Metro   On: 03/09/2014 22:03   Dg Chest Port 1 View  03/09/2014   CLINICAL DATA:  Syncope, loss is consciousness and shortness of breath.  EXAM: PORTABLE CHEST - 1 VIEW  COMPARISON:  01/08/2014  FINDINGS: The heart size and mediastinal contours are within normal limits. There is no evidence of pulmonary edema, consolidation, pneumothorax, nodule or pleural fluid. The visualized skeletal structures are unremarkable.  IMPRESSION: No active disease.   Electronically Signed   By: Irish Lack M.D.   On: 03/09/2014 18:38     EKG Interpretation   Date/Time:  Monday March 09 2014  17:00:03 EST Ventricular Rate:  97 PR Interval:  132 QRS Duration: 151 QT Interval:  428 QTC Calculation: 544 R Axis:   48 Text Interpretation:  Age not entered, assumed to be  49 years old for  purpose of ECG interpretation Sinus rhythm Right atrial enlargement IVCD,  consider atypical LBBB When compared with ECG of 01/08/2014, No significant  change was found Confirmed by [facility]  MD, Chondra Boyde ([id number]) on 03/09/2014 5:20:49  PM      MDM   Final diagnoses:  Syncope  Hypokalemia    Syncope of uncertain cause. Old records are reviewed and she had a cardiac catheterization within the past year showing patent stent and no other atherosclerotic disease. Recent TEE showed moderate mitral regurgitation, and slightly decreased ejection fraction of 45%. Anticipate she will need overnight cardiac monitoring.  Laboratory workup was significant for mild hypokalemia. She's given oral potassium. Head CT showed questionable subacute stroke. She was sent for MRI which showed no evidence of acute or subacute stroke. Case discussed with Dr. Toniann Fail of [facility] who agrees to admit the patient.  Dione Booze, MD 03/09/14 2240

## 2014-03-09 NOTE — ED Notes (Signed)
Pt. Reports woke up feeling weak with SOB and disorientation. States she took 2 extra lasix pills because she was feeling SOB.  States she went to work and last thing she remembers is sitting down because she felt bad, states "then I woke up and I was in the ambulance". Alert and oriented x4. Denies hx of seizures. Significant heart hx. Had a scan done on Friday and states she has a leaky valve.

## 2014-03-09 NOTE — Telephone Encounter (Signed)
I called and spoke with the patient regarding her TEE results.  She proceeded to report increased SOB today when she woke up around 10:00 am - she took lasix 40 mg total at this time. She was still SOB after she got out of the shower around 1:00 pm- she took an additional 20 mg of lasix at this time. She denies increase in her urination. She reports edema to her hands/ feet.  She denies any change in her fluid/ sodium intake. Most recent BUN/ creatinine within normal limits- I have instructed the patient to take lasix 40 mg x 2 more days.  The patient also complains of chest wall pain with her job. 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  She states she hurts so bad in her chest that she is uncertain if she needs to take her NTG or her methocarbamol.  She reports that she takes methocarbamol one tablet before she goes work.  She is asking for a refill on her methocarbamol.  Advised I would have to get approval from Dr. Eldridge Dace for this- last refill was by Nada Boozer, PA.  She is also asking for a letter from Dr. Eldridge Dace to take to her job stating she needs to have something with less strenuous work involved.  I advised I would forward her request to Dr. Eldridge Dace to address her SOB/ methacarbamol refill/ work note. I will be back in touch with her once these issues are addressed.   She is agreeable.

## 2014-03-09 NOTE — Telephone Encounter (Signed)
New Msg         Pt returning call from today, pt will be at work at 3:30.    Please call back. Please leave msgs after that.

## 2014-03-09 NOTE — H&P (Addendum)
[facility] History and Physical  Lindsay Camacho ZOX:[id number] DOB: 04-May-1965 DOA: 03/09/2014  Referring physician: ER physician. PCP: No PCP Per Patient  Chief Complaint: Loss of consciousness.  HPI: Lindsay Camacho is a 49 y.o. female with history of ischemic cardiomyopathy last EF measured 3 days ago by transesophageal echocardiogram was 40-45%, which was showing moderate MR, with history of CAD status post drug-eluting stent last cardiac catheter April 2015 complicated by retroperitoneal hematoma, hyperlipidemia and hypertension was brought to the ER after patient had a syncopal episode while working. Patient stated that this evening while working patient felt dizzy and she sat down. Following which patient loss consciousness. She was waking up in the ambulance van. Denies any palpitations chest pain nausea vomiting abdominal pain diarrhea fever chills. Denies any tongue biting incontinence of urine. Patient states that she took extra dose of her Lasix today morning after she was feeling short of breath. She did 2 extra doses than her regular dose of Lasix. Patient on exam is nonfocal. Initial CT head showed nonspecific finding and MRI of the brain was done which did not show any acute stroke. Patient has been admitted for further observation.   Review of Systems: As presented in the history of presenting illness, rest negative.  Past Medical History  Diagnosis Date  . Hypertension   . Stroke     tia with no deficits  . CAD (coronary artery disease) 11/13/2011    50% ? proximal LCx stenosis per Cath at [facility]  . PVD (peripheral vascular disease)     Right CFA stenosis per report on 11/14/2011  . HLD (hyperlipidemia)   . LBBB (left bundle branch block)   . Acute myocardial infarction of other lateral wall, initial episode of care 01/2013    DES CFX  . Intermediate coronary syndrome   . Chest pain, atypical, muscular skelatal   01/09/2014   Past Surgical History  Procedure Laterality  Date  . Cardiac catheterization  04/14/2013    Non-obstructive disease, patent CFX stent  . Tubal ligation    . Appendectomy    . Groin debridement Right 04/14/2013    Procedure: Emergency Evacuation of Retroperitoneal Hematoma and Repair Right External Iliac Artery Pseudoaneurysm    ;  Surgeon: Sherren Kerns, MD;  Location: [facility] OR;  Service: Vascular;  Laterality: Right;  . Coronary angioplasty with stent placement  01/17/2013    mild disease except 99% CFX, rx with  2.5 x 28 [place] drug-eluting stent   . Left heart catheterization with coronary angiogram N/A 01/18/2013    Procedure: LEFT HEART CATHETERIZATION WITH CORONARY ANGIOGRAM;  Surgeon: Corky Crafts, MD;  Location: [facility] CATH LAB;  Service: Cardiovascular;  Laterality: N/A;  . Percutaneous coronary stent intervention (pci-s)  01/18/2013    Procedure: PERCUTANEOUS CORONARY STENT INTERVENTION (PCI-S);  Surgeon: Corky Crafts, MD;  Location: [facility] CATH LAB;  Service: Cardiovascular;;  . Left heart catheterization with coronary angiogram N/A 04/14/2013    Procedure: LEFT HEART CATHETERIZATION WITH CORONARY ANGIOGRAM;  Surgeon: Corky Crafts, MD;  Location: [facility] CATH LAB;  Service: Cardiovascular;  Laterality: N/A;  . Tee without cardioversion N/A 03/06/2014    Procedure: TRANSESOPHAGEAL ECHOCARDIOGRAM (TEE);  Surgeon: Lars Masson, MD;  Location: [facility] ENDOSCOPY;  Service: Cardiovascular;  Laterality: N/A;   Social History:  reports that she has been smoking.  She has never used smokeless tobacco. She reports that she uses illicit drugs (Marijuana). She reports that she does not drink alcohol. Where does patient  live at home. Can patient participate in ADLs? Yes.  Allergies  Allergen Reactions  . Brilinta [Ticagrelor] Other (See Comments)    Dyspnea  . Other Other (See Comments)    BP med ? BP bottomed out  . Penicillins Rash    Family History:  Family History  Problem Relation Age of Onset  . CAD Mother 38  . Lung  cancer Mother   . Bladder Cancer Mother   . Stroke Mother   . Cancer Mother     Lung and Bladder  . Heart disease Mother     Before age 75  . Hypertension Mother   . Heart attack Mother   . CAD Father 82  . Heart disease Father     Before age 54  . Heart attack Father   . Stroke Father     Bleeding stroke      Prior to Admission medications   Medication Sig Start Date End Date Taking? Authorizing Provider  acetaminophen (TYLENOL) 325 MG tablet Take 325 mg by mouth every 6 (six) hours as needed for moderate pain.   Yes Historical Provider, MD  clopidogrel (PLAVIX) 75 MG tablet Take 1 tablet (75 mg total) by mouth daily with breakfast. 02/17/14  Yes Corky Crafts, MD  furosemide (LASIX) 20 MG tablet Take one tablet by mouth daily.  May take an extra 20 mg tablet daily as needed for shortness of breath or fluid overload. 01/28/14  Yes Corky Crafts, MD  ibuprofen (ADVIL,MOTRIN) 400 MG tablet Take 1 tablet (400 mg total) by mouth 3 (three) times daily. For 2 days then use as needed three times a day for chest wall pain. 01/09/14  Yes Leone Brand, NP  isosorbide mononitrate (IMDUR) 30 MG 24 hr tablet Take 1 tablet (30 mg total) by mouth daily. 02/17/14  Yes Corky Crafts, MD  methocarbamol (ROBAXIN) 500 MG tablet Take 2 tablets (1,000 mg total) by mouth every 8 (eight) hours as needed for muscle spasms. 01/09/14  Yes Leone Brand, NP  potassium chloride (K-DUR) 10 MEQ tablet Take 1 tablet (10 mEq total) by mouth daily. 01/09/14  Yes Leone Brand, NP  rosuvastatin (CRESTOR) 10 MG tablet Take 1 tablet (10 mg total) by mouth daily. 02/17/14  Yes Corky Crafts, MD  aspirin 81 MG tablet Take 1 tablet (81 mg total) by mouth daily. 10/08/12   Christiane Ha, MD  nitroGLYCERIN (NITROSTAT) 0.4 MG SL tablet Place 1 tablet (0.4 mg total) under the tongue every 5 (five) minutes x 3 doses as needed for chest pain. 10/27/13   Corky Crafts, MD    Physical Exam: Filed  Vitals:   03/09/14 2215 03/09/14 2245 03/09/14 2345 03/09/14 2348  BP: 133/74 129/91    Pulse: 84 105 87   Temp:   97.9 F (36.6 C)   TempSrc:   Oral   Resp: 20 14 17    Height:    5\' 3"  (1.6 m)  Weight:    49.17 kg (108 lb 6.4 oz)  SpO2: 97% 97%       General:  Moderately built and nourished.  Eyes: Anicteric no pallor.  ENT: No discharge from the ears eyes nose or mouth.  Neck: No mass felt.  Cardiovascular: S1 and S2 heard.  Respiratory: Mild bilateral expiratory wheeze heard no crepitations.  Abdomen: Soft nontender bowel sounds present.  Skin: No rash.  Musculoskeletal: No edema.  Psychiatric: Appears normal.  Neurologic: Alert awake oriented to time  place and person. Moves all extremities.  Labs on Admission:  Basic Metabolic Panel:  Recent Labs Lab 03/09/14 1804  NA 137  K 3.3*  CL 102  CO2 25  GLUCOSE 83  BUN 7  CREATININE 0.79  CALCIUM 9.1   Liver Function Tests: No results for input(s): AST, ALT, ALKPHOS, BILITOT, PROT, ALBUMIN in the last 168 hours. No results for input(s): LIPASE, AMYLASE in the last 168 hours. No results for input(s): AMMONIA in the last 168 hours. CBC:  Recent Labs Lab 03/09/14 1804  WBC 8.4  NEUTROABS 5.4  HGB 16.0*  HCT 47.7*  MCV 86.4  PLT 190   Cardiac Enzymes:  Recent Labs Lab 03/09/14 1804  TROPONINI <0.03    BNP (last 3 results)  Recent Labs  01/08/14 1409  BNP 1118.4*    ProBNP (last 3 results) No results for input(s): PROBNP in the last 8760 hours.  CBG:  Recent Labs Lab 03/09/14 1724  GLUCAP 94    Radiological Exams on Admission: Ct Head Wo Contrast  03/09/2014   CLINICAL DATA:  49 year old female with history of syncope.  EXAM: CT HEAD WITHOUT CONTRAST  TECHNIQUE: Contiguous axial images were obtained from the base of the skull through the vertex without intravenous contrast.  COMPARISON:  No priors.  FINDINGS: Small ill-defined focus of low attenuation in the right frontal lobe  white matter immediately lateral to the head of the right caudate (image 13 of series 2), concerning for potential age-indeterminate ischemia. No acute intracranial abnormalities. Specifically, no evidence of acute intracranial hemorrhage, no mass, mass effect, hydrocephalus or abnormal intra or extra-axial fluid collections. Visualized paranasal sinuses and mastoids are well pneumatized. No acute displaced skull fractures are identified.  IMPRESSION: 1. Small focus of age-indeterminate ischemia in the right frontal lobe white matter. While this may be old, the possibility of subacute ischemia is not excluded, and this could be better delineated with the brain MRI with and without IV gadolinium if clinically appropriate. 2. No other potential acute findings. These results were called by telephone at the time of interpretation on 03/09/2014 at 7:29 pm to Dr. Dione Booze, who verbally acknowledged these results.   Electronically Signed   By: Trudie Reed M.D.   On: 03/09/2014 19:32   Mr Brain Wo Contrast  03/09/2014   CLINICAL DATA:  Woke up feeling weak, short of breath and disoriented. Syncopal episode after taking 2 extra Lasix pills today.  EXAM: MRI HEAD WITHOUT CONTRAST  TECHNIQUE: Multiplanar, multiecho pulse sequences of the brain and surrounding structures were obtained without intravenous contrast.  COMPARISON:  CT of the head March 09, 2014 at 1912 hours  FINDINGS: Motion degraded examination, ranging from mild to moderate degraded sequences.  The ventricles and sulci are normal for patient's age. No mass lesions, mass effect. Subcentimeter focus of T2 bright signal in the RIGHT corona radiata corresponding to CT abnormality. Additional subtle areas of parenchymal signal abnormality may be undetected due to mild motion. No reduced diffusion to suggest acute ischemia. No susceptibility artifact to suggest hemorrhage. Possible LEFT medial parietal developmental venous anomaly. Dolicoectatic intracranial  vessels suggests chronic hypertension.  No abnormal extra-axial fluid collections. No extra-axial masses though, contrast enhanced sequences would be more sensitive. Normal major intracranial vascular flow voids seen at the skull base.  Ocular globes and orbital contents are unremarkable though not tailored for evaluation. No abnormal sellar expansion. Visualized paranasal sinuses and mastoid air cells are well-aerated. No suspicious calvarial bone marrow signal. No abnormal sellar  expansion. Craniocervical junction maintained.  IMPRESSION: Mild to moderately motion degraded examination.  No acute intracranial process, specifically no acute ischemia.  Sub cm RIGHT corona radiata lesion suggests remote ischemia, less likely demyelination.   Electronically Signed   By: Awilda Metro   On: 03/09/2014 22:03   Dg Chest Port 1 View  03/09/2014   CLINICAL DATA:  Syncope, loss is consciousness and shortness of breath.  EXAM: PORTABLE CHEST - 1 VIEW  COMPARISON:  01/08/2014  FINDINGS: The heart size and mediastinal contours are within normal limits. There is no evidence of pulmonary edema, consolidation, pneumothorax, nodule or pleural fluid. The visualized skeletal structures are unremarkable.  IMPRESSION: No active disease.   Electronically Signed   By: Irish Lack M.D.   On: 03/09/2014 18:38    EKG: Independently reviewed. Normal sinus rhythm with LBBB.  Assessment/Plan Principal Problem:   Syncope Active Problems:   HTN (hypertension)   CAD - CFX DES 01/17/13   Cardiomyopathy, ischemic- EF 40-45% by echo 01/18/13, improved in 04/2013 to 50-55%   1. Syncope - patient states she took twice the dose of Lasix she usually takes when she woke up due to shortness of breath. At this time we will check orthostatics. We will hold off Lasix for now and closely follow telemetry for any arrhythmias. Patient has had recent 2-D echo which showed EF of 40-40% with moderate MR. 2. CAD status post stenting - denies  any chest pain. 3. Shortness of breath - presently not short of breath. But does have bilateral expiratory wheeze on exam. Will keep patient on nebulizer. Closely observe. Check d-dimer. 4. Ischemic cardiomyopathy last EF measured was 40-45% 3 days ago by TEE with moderate MR - presently holding off Lasix as patient has taken more than her usual dose. Check orthostatics and closely observe. Patient may need ACE inhibitor as are ARB if blood pressure allows. Closely follow intake and output and daily weights. 5. Hyperlipidemia - continue statins.   DVT Prophylaxis Lovenox.  Code Status: Full code.  Family Communication: None.  Disposition Plan: Admit for observation.    KAKRAKANDY,ARSHAD N. [facility] Pager [phone].  If 7PM-7AM, please contact night-coverage [email or web address] Password [facility] 03/09/2014, 11:57 PM

## 2014-03-10 ENCOUNTER — Observation Stay (HOSPITAL_COMMUNITY): Payer: BLUE CROSS/BLUE SHIELD

## 2014-03-10 ENCOUNTER — Encounter (HOSPITAL_COMMUNITY): Payer: Self-pay

## 2014-03-10 LAB — BASIC METABOLIC PANEL
Anion gap: 9 (ref 5–15)
BUN: 8 mg/dL (ref 6–23)
CO2: 31 mmol/L (ref 19–32)
Calcium: 9.6 mg/dL (ref 8.4–10.5)
Chloride: 100 mmol/L (ref 96–112)
Creatinine, Ser: 0.9 mg/dL (ref 0.50–1.10)
GFR calc Af Amer: 86 mL/min — ABNORMAL LOW (ref >90)
GFR calc non Af Amer: 74 mL/min — ABNORMAL LOW (ref >90)
Glucose, Bld: 93 mg/dL (ref 70–99)
Potassium: 3.4 mmol/L — ABNORMAL LOW (ref 3.5–5.1)
SODIUM: 140 mmol/L (ref 135–145)

## 2014-03-10 LAB — CBC WITH DIFFERENTIAL/PLATELET
Basophils Absolute: 0 10*3/uL (ref 0.0–0.1)
Basophils Relative: 0 % (ref 0–1)
EOS ABS: 0.1 10*3/uL (ref 0.0–0.7)
Eosinophils Relative: 2 % (ref 0–5)
HEMATOCRIT: 47.4 % — AB (ref 36.0–46.0)
Hemoglobin: 15.9 g/dL — ABNORMAL HIGH (ref 12.0–15.0)
Lymphocytes Relative: 31 % (ref 12–46)
Lymphs Abs: 2.4 10*3/uL (ref 0.7–4.0)
MCH: 29.1 pg (ref 26.0–34.0)
MCHC: 33.5 g/dL (ref 30.0–36.0)
MCV: 86.7 fL (ref 78.0–100.0)
MONO ABS: 0.6 10*3/uL (ref 0.1–1.0)
Monocytes Relative: 8 % (ref 3–12)
Neutro Abs: 4.5 10*3/uL (ref 1.7–7.7)
Neutrophils Relative %: 59 % (ref 43–77)
Platelets: 205 10*3/uL (ref 150–400)
RBC: 5.47 MIL/uL — AB (ref 3.87–5.11)
RDW: 14.6 % (ref 11.5–15.5)
WBC: 7.6 10*3/uL (ref 4.0–10.5)

## 2014-03-10 LAB — TROPONIN I: Troponin I: <0.03 ng/mL (ref <0.031)

## 2014-03-10 LAB — MAGNESIUM: Magnesium: 1.7 mg/dL (ref 1.5–2.5)

## 2014-03-10 LAB — D-DIMER, QUANTITATIVE (NOT AT ARMC): D-Dimer, Quant: 0.71 ug/mL-FEU — ABNORMAL HIGH (ref 0.00–0.48)

## 2014-03-10 MED ORDER — MAGNESIUM SULFATE 2 GM/50ML IV SOLN
2.0000 g | Freq: Once | INTRAVENOUS | Status: AC
  Administered 2014-03-10: 2 g via INTRAVENOUS
  Filled 2014-03-10: qty 50

## 2014-03-10 MED ORDER — IOHEXOL 350 MG/ML SOLN
100.0000 mL | Freq: Once | INTRAVENOUS | Status: AC | PRN
  Administered 2014-03-10: 80 mL via INTRAVENOUS

## 2014-03-10 MED ORDER — SODIUM CHLORIDE 0.9 % IV SOLN
INTRAVENOUS | Status: AC
  Administered 2014-03-10 (×2): via INTRAVENOUS

## 2014-03-10 MED ORDER — ACETAMINOPHEN 325 MG PO TABS
650.0000 mg | ORAL_TABLET | Freq: Once | ORAL | Status: AC
  Administered 2014-03-10: 650 mg via ORAL

## 2014-03-10 MED ORDER — IPRATROPIUM BROMIDE 0.02 % IN SOLN
0.5000 mg | RESPIRATORY_TRACT | Status: DC
  Filled 2014-03-10: qty 2.5

## 2014-03-10 MED ORDER — ALBUTEROL SULFATE (2.5 MG/3ML) 0.083% IN NEBU: 2.5000 mg | INHALATION_SOLUTION | RESPIRATORY_TRACT | Status: DC | PRN

## 2014-03-10 MED ORDER — POTASSIUM CHLORIDE CRYS ER 20 MEQ PO TBCR
40.0000 meq | EXTENDED_RELEASE_TABLET | Freq: Once | ORAL | Status: AC
  Administered 2014-03-10: 40 meq via ORAL
  Filled 2014-03-10: qty 2

## 2014-03-10 MED ORDER — ALBUTEROL SULFATE (2.5 MG/3ML) 0.083% IN NEBU
2.5000 mg | INHALATION_SOLUTION | RESPIRATORY_TRACT | Status: DC
  Filled 2014-03-10: qty 3

## 2014-03-10 NOTE — Telephone Encounter (Signed)
She needs to get methocarbamol from her PMD.  What can she do at her job tha tis less strenuous.

## 2014-03-10 NOTE — Progress Notes (Signed)
UR completed 

## 2014-03-10 NOTE — Progress Notes (Signed)
Patient Demographics  Lindsay Camacho, is a 49 y.o. female, DOB - 26-Jan-1965, ZOX:[id number]  Admit date - 03/09/2014   Admitting Physician Eduard Clos, MD  Outpatient Primary MD for the patient is No PCP Per Patient  LOS -    Chief Complaint  Patient presents with  . Loss of Consciousness      Admission history of present illness/brief narrative: Lindsay Camacho is a 49 y.o. female with history of ischemic cardiomyopathy last EF measured 3 days ago by transesophageal echocardiogram was 40-45%, which was showing moderate MR, with history of CAD status post drug-eluting stent last cardiac catheter April 2015 complicated by retroperitoneal hematoma, hyperlipidemia and hypertension was brought to the ER after patient had a syncopal episode while working. Patient stated that this evening while working patient felt dizzy and she sat down. Following which patient loss consciousness. She was waking up in the ambulance van. Denies any palpitations chest pain nausea vomiting abdominal pain diarrhea fever chills. Denies any tongue biting incontinence of urine. Patient states that she took extra dose of her Lasix today morning after she was feeling short of breath. She did 2 extra doses than her regular dose of Lasix. Patient on exam is nonfocal. Initial CT head showed nonspecific finding and MRI of the brain was done which did not show any acute stroke. Patient has been admitted for further observation.  A shunt was orthostatic from arthritis point of view, heart rate went up more than 20 from supine to standing position , patient diuresis was held during hospital stay.   Subjective:   Lindsay Camacho today has, No headache, No chest pain, No abdominal pain - No Nausea, No new weakness tingling or numbness, No Cough - SOB.   Assessment & Plan    Principal Problem:   Syncope Active  Problems:   HTN (hypertension)   CAD - CFX DES 01/17/13   Cardiomyopathy, ischemic- EF 40-45% by echo 01/18/13, improved in 04/2013 to 50-55%   syncope  -We'll continue with gentle hydration,  most likely related to orthostasis and volume depletion, as she had extra 2 doses of Lasix yesterday  as she was short of breath . - will continue with gentle hydration, will hold off Lasix, and will continue with telemetry monitoring for arrhythmias. - 2-D echo just recently done with EF 40-45%, with moderate MR,. - No acute finding in CT head or MRI.  Coronary artery disease - Denies any chest pain, continue with Plavix and statin  Dyspnea - No evidence of volume overload on physical exam or CT chest, d-dimer is mildly elevated, but a CT chest negative for PE.  Ischemic cardiomyopathy with EF 40-45% - monitor closely as an gentle hydration, does not appear to be in volume overload  Hyperlipidemia - Continue with statin  Code Status: Full   Family Communication: None at bedside   Disposition Plan: Home once stable    Procedures  None    Consults   None    Medications  Scheduled Meds: . clopidogrel  75 mg Oral Daily  . enoxaparin (LOVENOX) injection  30 mg Subcutaneous Q24H  . isosorbide mononitrate  30 mg Oral Daily  . magnesium sulfate 1 - 4 g bolus IVPB  2 g Intravenous  Once  . potassium chloride  10 mEq Oral Daily  . potassium chloride  40 mEq Oral Once  . rosuvastatin  10 mg Oral Daily  . sodium chloride  3 mL Intravenous Q12H   Continuous Infusions: . sodium chloride     PRN Meds:.acetaminophen **OR** acetaminophen, albuterol, methocarbamol, nitroGLYCERIN, ondansetron **OR** ondansetron (ZOFRAN) IV  DVT Prophylaxis  Lovenox -  Lab Results  Component Value Date   PLT 205 03/10/2014    Antibiotics    Anti-infectives    None          Objective:   Filed Vitals:   03/09/14 2245 03/09/14 2345 03/09/14 2348 03/10/14 0534  BP: 129/91   137/78  Pulse: 105  87  92  Temp:  97.9 F (36.6 C)  98.3 F (36.8 C)  TempSrc:  Oral  Oral  Resp: Height:    (1.6 m)   Weight:   49.17 kg (108 lb 6.4 oz) 49.306 kg (108 lb 11.2 oz)  SpO2: 97%       Wt Readings from Last 3 Encounters:  03/10/14 49.306 kg (108 lb 11.2 oz)  03/06/14 51.256 kg (113 lb)  02/12/14 51.256 kg (113 lb)     Intake/Output Summary (Last 24 hours) at 03/10/14 1448 Last data filed at 03/10/14 1343  Gross per 24 hour  Intake    840 ml  Output    350 ml  Net    490 ml     Physical Exam  Awake Alert, Oriented X 3, No new F.N deficits, Normal affect [place].AT,PERRAL Supple Neck,No JVD, No cervical lymphadenopathy appriciated.  Symmetrical Chest wall movement, Good air movement bilaterally, CTAB RRR,No Gallops,Rubs or new Murmurs, No Parasternal Heave +ve B.Sounds, Abd Soft, No tenderness, No organomegaly appriciated, No rebound - guarding or rigidity. No Cyanosis, Clubbing or edema, No new Rash or bruise     Data Review   Micro Results No results found for this or any previous visit (from the past 240 hour(s)).  Radiology Reports Ct Head Wo Contrast  03/09/2014   CLINICAL DATA:  49 year old female with history of syncope.  EXAM: CT HEAD WITHOUT CONTRAST  TECHNIQUE: Contiguous axial images were obtained from the base of the skull through the vertex without intravenous contrast.  COMPARISON:  No priors.  FINDINGS: Small ill-defined focus of low attenuation in the right frontal lobe white matter immediately lateral to the head of the right caudate (image 13 of series 2), concerning for potential age-indeterminate ischemia. No acute intracranial abnormalities. Specifically, no evidence of acute intracranial hemorrhage, no mass, mass effect, hydrocephalus or abnormal intra or extra-axial fluid collections. Visualized paranasal sinuses and mastoids are well pneumatized. No acute displaced skull fractures are identified.  IMPRESSION: 1. Small focus of age-indeterminate  ischemia in the right frontal lobe white matter. While this may be old, the possibility of subacute ischemia is not excluded, and this could be better delineated with the brain MRI with and without IV gadolinium if clinically appropriate. 2. No other potential acute findings. These results were called by telephone at the time of interpretation on 03/09/2014 at 7:29 pm to Dr. Dione Booze, who verbally acknowledged these results.   Electronically Signed   By: Trudie Reed M.D.   On: 03/09/2014 19:32   Ct Angio Chest Pe W/cm &/or Wo Cm  03/10/2014   CLINICAL DATA:  Mid chest pain and discomfort.  Shortness of breath.  EXAM: CT ANGIOGRAPHY CHEST WITH CONTRAST  TECHNIQUE: Multidetector CT  imaging of the chest was performed using the standard protocol during bolus administration of intravenous contrast. Multiplanar CT image reconstructions and MIPs were obtained to evaluate the vascular anatomy.  CONTRAST:  52mL OMNIPAQUE IOHEXOL 350 MG/ML SOLN  COMPARISON:  Chest radiograph 1 day prior.  FINDINGS: There are no filling defects within the pulmonary arteries to suggest pulmonary embolus.  The thoracic aorta is normal in caliber. Coronary artery stent is seen. The heart is upper normal in size, probable left ventricular hypertrophy. There is no pleural or pericardial effusion. No mediastinal or hilar adenopathy.  The lungs are clear. There is no consolidation, pulmonary nodule or mass.  Evaluation of the upper abdomen demonstrates no acute abnormality. No acute or suspicious osseous abnormalities.  Review of the MIP images confirms the above findings.  IMPRESSION: 1. No pulmonary embolus. 2. No acute intrathoracic process. 3. Coronary artery stent.  Mild aortic atherosclerosis.   Electronically Signed   By: Rubye Oaks M.D.   On: 03/10/2014 06:57   Mr Brain Wo Contrast  03/09/2014   CLINICAL DATA:  Woke up feeling weak, short of breath and disoriented. Syncopal episode after taking 2 extra Lasix pills today.   EXAM: MRI HEAD WITHOUT CONTRAST  TECHNIQUE: Multiplanar, multiecho pulse sequences of the brain and surrounding structures were obtained without intravenous contrast.  COMPARISON:  CT of the head March 09, 2014 at 1912 hours  FINDINGS: Motion degraded examination, ranging from mild to moderate degraded sequences.  The ventricles and sulci are normal for patient's age. No mass lesions, mass effect. Subcentimeter focus of T2 bright signal in the RIGHT corona radiata corresponding to CT abnormality. Additional subtle areas of parenchymal signal abnormality may be undetected due to mild motion. No reduced diffusion to suggest acute ischemia. No susceptibility artifact to suggest hemorrhage. Possible LEFT medial parietal developmental venous anomaly. Dolicoectatic intracranial vessels suggests chronic hypertension.  No abnormal extra-axial fluid collections. No extra-axial masses though, contrast enhanced sequences would be more sensitive. Normal major intracranial vascular flow voids seen at the skull base.  Ocular globes and orbital contents are unremarkable though not tailored for evaluation. No abnormal sellar expansion. Visualized paranasal sinuses and mastoid air cells are well-aerated. No suspicious calvarial bone marrow signal. No abnormal sellar expansion. Craniocervical junction maintained.  IMPRESSION: Mild to moderately motion degraded examination.  No acute intracranial process, specifically no acute ischemia.  Sub cm RIGHT corona radiata lesion suggests remote ischemia, less likely demyelination.   Electronically Signed   By: Awilda Metro   On: 03/09/2014 22:03   Dg Chest Port 1 View  03/09/2014   CLINICAL DATA:  Syncope, loss is consciousness and shortness of breath.  EXAM: PORTABLE CHEST - 1 VIEW  COMPARISON:  01/08/2014  FINDINGS: The heart size and mediastinal contours are within normal limits. There is no evidence of pulmonary edema, consolidation, pneumothorax, nodule or pleural fluid. The  visualized skeletal structures are unremarkable.  IMPRESSION: No active disease.   Electronically Signed   By: Irish Lack M.D.   On: 03/09/2014 18:38    CBC  Recent Labs Lab 03/09/14 1804 03/10/14 0011  WBC 8.4 7.6  HGB 16.0* 15.9*  HCT 47.7* 47.4*  PLT 190 205  MCV 86.4 86.7  MCH 29.0 29.1  MCHC 33.5 33.5  RDW 14.4 14.6  LYMPHSABS 2.2 2.4  MONOABS 0.7 0.6  EOSABS 0.2 0.1  BASOSABS 0.0 0.0    Chemistries   Recent Labs Lab 03/09/14 1804 03/10/14 0011  NA 137 140  K 3.3* 3.4*  CL 102 100  CO2 25 31  GLUCOSE 83 93  BUN 7 8  CREATININE 0.79 0.90  CALCIUM 9.1 9.6  MG  --  1.7   ------------------------------------------------------------------------------------------------------------------ estimated creatinine clearance is 59.5 mL/min (by C-G formula based on Cr of 0.9). ------------------------------------------------------------------------------------------------------------------ No results for input(s): HGBA1C in the last 72 hours. ------------------------------------------------------------------------------------------------------------------ No results for input(s): CHOL, HDL, LDLCALC, TRIG, CHOLHDL, LDLDIRECT in the last 72 hours. ------------------------------------------------------------------------------------------------------------------ No results for input(s): TSH, T4TOTAL, T3FREE, THYROIDAB in the last 72 hours.  Invalid input(s): FREET3 ------------------------------------------------------------------------------------------------------------------ No results for input(s): VITAMINB12, FOLATE, FERRITIN, TIBC, IRON, RETICCTPCT in the last 72 hours.  Coagulation profile No results for input(s): INR, PROTIME in the last 168 hours.   Recent Labs  03/10/14 0011  DDIMER 0.71*    Cardiac Enzymes  Recent Labs Lab 03/09/14 1804 03/10/14 0011  TROPONINI <0.03 <0.03    ------------------------------------------------------------------------------------------------------------------ Invalid input(s): POCBNP     Time Spent in minutes   30 minutes    Emori Kamau M.D on 03/10/2014 at 2:48 PM  Between 7am to 7pm - Pager - [phone]  After 7pm go to [email or web address] - password [email or web address]  And look for the night coverage person covering for me after hours  [facility] Office  [phone]   **Disclaimer: This note may have been dictated with voice recognition software. Similar sounding words can inadvertently be transcribed and this note may contain transcription errors which may not have been corrected upon publication of note.**

## 2014-03-10 NOTE — Evaluation (Addendum)
Physical Therapy Evaluation Patient Details Name: Lindsay Camacho MRN: [id number] DOB: 05/20/1965 Today's Date: 03/10/2014   History of Present Illness  Pt adm with syncope. PMH - CVA, PVD, CAD, MI  Clinical Impression  Pt admitted with above diagnosis. Pt currently with functional limitations due to the deficits listed below (see PT Problem List).  Pt will benefit from skilled PT to increase their independence and safety with mobility to allow discharge to the venue listed below.  Expect pt will progress quickly and will not require PT after DC.     Follow Up Recommendations No PT follow up    Equipment Recommendations  None recommended by PT    Recommendations for Other Services       Precautions / Restrictions Precautions Precautions: Fall      Mobility  Bed Mobility Overal bed mobility: Modified Independent                Transfers Overall transfer level: Modified independent                  Ambulation/Gait Ambulation/Gait assistance: Min guard Ambulation Distance (Feet): 125 Feet Assistive device: None Gait Pattern/deviations: Step-through pattern;Decreased stride length   Gait velocity interpretation: Below normal speed for age/gender General Gait Details: Slightly unsteady requiring min guard for safety. No overt loss of balance.  Stairs            Wheelchair Mobility    Modified Rankin (Stroke Patients Only)       Balance Overall balance assessment: Needs assistance Sitting-balance support: No upper extremity supported Sitting balance-Leahy Scale: Normal     Standing balance support: No upper extremity supported Standing balance-Leahy Scale: Good                               Pertinent Vitals/Pain Pain Assessment: 0-10 Pain Score: 6  Pain Location: headache Pain Descriptors / Indicators: Aching Pain Intervention(s): Limited activity within patient's tolerance;Repositioned;Patient requesting pain meds-RN notified     Home Living Family/patient expects to be discharged to:: Private residence Living Arrangements: Alone Available Help at Discharge: Family;Available 24 hours/day Type of Home: Apartment Home Access: Level entry     Home Layout: One level Home Equipment: Walker - 2 wheels      Prior Function Level of Independence: Independent         Comments: Works     Higher education careers adviser        Extremity/Trunk Assessment   Upper Extremity Assessment: Overall WFL for tasks assessed           Lower Extremity Assessment: Overall WFL for tasks assessed         Communication   Communication: No difficulties  Cognition Arousal/Alertness: Awake/alert Behavior During Therapy: Flat affect Overall Cognitive Status: Within Functional Limits for tasks assessed                      General Comments      Exercises        Assessment/Plan    PT Assessment Patient needs continued PT services  PT Diagnosis Difficulty walking   PT Problem List Decreased activity tolerance;Decreased balance;Decreased mobility  PT Treatment Interventions Gait training;DME instruction;Functional mobility training;Therapeutic activities;Therapeutic exercise;Balance training;Patient/family education   PT Goals (Current goals can be found in the Care Plan section) Acute Rehab PT Goals Patient Stated Goal: Return home PT Goal Formulation: With patient Time For Goal Achievement: 03/17/14 Potential to Achieve  Goals: Good    Frequency     Barriers to discharge        Co-evaluation               End of Session   Activity Tolerance: Patient limited by pain (headache) Patient left: in bed;with call bell/phone within reach Nurse Communication: Mobility status    Functional Assessment Tool Used: clinical judgement Functional Limitation: Mobility: Walking and moving around Mobility: Walking and Moving Around Current Status (E7035): At least 1 percent but less than 20 percent impaired,  limited or restricted Mobility: Walking and Moving Around Goal Status [phone]): 0 percent impaired, limited or restricted    Time: 1213-1220 PT Time Calculation (min) (ACUTE ONLY): 7 min   Charges:   PT Evaluation $Initial PT Evaluation Tier I: 1 Procedure     PT G Codes:   PT G-Codes **NOT FOR INPATIENT CLASS** Functional Assessment Tool Used: clinical judgement Functional Limitation: Mobility: Walking and moving around Mobility: Walking and Moving Around Current Status (H8299): At least 1 percent but less than 20 percent impaired, limited or restricted Mobility: Walking and Moving Around Goal Status [phone]): 0 percent impaired, limited or restricted    [facility] 03/10/2014, 1:59 PM  [facility] PT [phone]

## 2014-03-11 LAB — BASIC METABOLIC PANEL
Anion gap: 6 (ref 5–15)
BUN: 16 mg/dL (ref 6–23)
CHLORIDE: 106 mmol/L (ref 96–112)
CO2: 30 mmol/L (ref 19–32)
CREATININE: 0.9 mg/dL (ref 0.50–1.10)
Calcium: 9.8 mg/dL (ref 8.4–10.5)
GFR calc Af Amer: 86 mL/min — ABNORMAL LOW (ref >90)
GFR calc non Af Amer: 74 mL/min — ABNORMAL LOW (ref >90)
Glucose, Bld: 86 mg/dL (ref 70–99)
Potassium: 5.4 mmol/L — ABNORMAL HIGH (ref 3.5–5.1)
Sodium: 142 mmol/L (ref 135–145)

## 2014-03-11 LAB — POTASSIUM: Potassium: 4.7 mmol/L (ref 3.5–5.1)

## 2014-03-11 LAB — MAGNESIUM: Magnesium: 2.3 mg/dL (ref 1.5–2.5)

## 2014-03-11 MED ORDER — POTASSIUM CHLORIDE ER 10 MEQ PO TBCR: 10.0000 meq | EXTENDED_RELEASE_TABLET | Freq: Every day | ORAL | Status: DC

## 2014-03-11 NOTE — Progress Notes (Signed)
Discharge instructions given and all questions answered.  Patient discharged via wheelchair with all belongings.

## 2014-03-11 NOTE — Progress Notes (Signed)
UR completed 

## 2014-03-11 NOTE — Progress Notes (Signed)
DC IV, DC Tele, DC Home. Discharge instructions and home medications discussed with patient. Patient denied any questions or concerns at this time. Patient leaving unit via wheelchair and appears in no acute distress.  

## 2014-03-11 NOTE — Discharge Instructions (Signed)
Follow with Primary MD  in 7 days  ° °Get CBC, CMP, 2 view Chest X ray checked  by Primary MD next visit.  ° ° °Activity: As tolerated with Full fall precautions use walker/cane & assistance as needed ° ° °Disposition Home  ° ° °Diet: Heart Healthy  , with feeding assistance and aspiration precautions as needed. ° °For Heart failure patients - Check your Weight same time everyday, if you gain over 2 pounds, or you develop in leg swelling, experience more shortness of breath or chest pain, call your Primary MD immediately. Follow Cardiac Low Salt Diet and 1.5 lit/day fluid restriction. ° ° °On your next visit with your primary care physician please Get Medicines reviewed and adjusted. ° ° °Please request your Prim.MD to go over all Hospital Tests and Procedure/Radiological results at the follow up, please get all Hospital records sent to your Prim MD by signing hospital release before you go home. ° ° °If you experience worsening of your admission symptoms, develop shortness of breath, life threatening emergency, suicidal or homicidal thoughts you must seek medical attention immediately by calling 911 or calling your MD immediately  if symptoms less severe. ° °You Must read complete instructions/literature along with all the possible adverse reactions/side effects for all the Medicines you take and that have been prescribed to you. Take any new Medicines after you have completely understood and accpet all the possible adverse reactions/side effects.  ° °Do not drive, operating heavy machinery, perform activities at heights, swimming or participation in water activities or provide baby sitting services if your were admitted for syncope or siezures until you have seen by Primary MD or a Neurologist and advised to do so again. ° °Do not drive when taking Pain medications.  ° ° °Do not take more than prescribed Pain, Sleep and Anxiety Medications ° °Special Instructions: If you have smoked or chewed Tobacco  in the last  2 yrs please stop smoking, stop any regular Alcohol  and or any Recreational drug use. ° °Wear Seat belts while driving. ° ° °Please note ° °You were cared for by a hospitalist during your hospital stay. If you have any questions about your discharge medications or the care you received while you were in the hospital after you are discharged, you can call the unit and asked to speak with the hospitalist on call if the hospitalist that took care of you is not available. Once you are discharged, your primary care physician will handle any further medical issues. Please note that NO REFILLS for any discharge medications will be authorized once you are discharged, as it is imperative that you return to your primary care physician (or establish a relationship with a primary care physician if you do not have one) for your aftercare needs so that they can reassess your need for medications and monitor your lab values. ° °

## 2014-03-11 NOTE — Discharge Summary (Signed)
Lindsay Camacho, 49 y.o., DOB March 05, 1965, MRN [id number]. Admission date: 03/09/2014 Discharge Date 03/11/2014 Primary MD No PCP Per Patient Admitting Physician Eduard Clos, MD   PCP please follow-up on: - Check CBC, BMP during next visit  Admission Diagnosis  Hypokalemia [E87.6] Syncope [R55]  Discharge Diagnosis   Principal Problem:   Syncope Active Problems:   HTN (hypertension)   CAD - CFX DES 01/17/13   Cardiomyopathy, ischemic- EF 40-45% by echo 01/18/13, improved in 04/2013 to 50-55%     Past Medical History  Diagnosis Date  . Hypertension   . Stroke     tia with no deficits  . CAD (coronary artery disease) 11/13/2011    50% ? proximal LCx stenosis per Cath at [facility]  . PVD (peripheral vascular disease)     Right CFA stenosis per report on 11/14/2011  . HLD (hyperlipidemia)   . LBBB (left bundle branch block)   . Acute myocardial infarction of other lateral wall, initial episode of care 01/2013    DES CFX  . Intermediate coronary syndrome   . Chest pain, atypical, muscular skelatal   01/09/2014    Past Surgical History  Procedure Laterality Date  . Cardiac catheterization  04/14/2013    Non-obstructive disease, patent CFX stent  . Tubal ligation    . Appendectomy    . Groin debridement Right 04/14/2013    Procedure: Emergency Evacuation of Retroperitoneal Hematoma and Repair Right External Iliac Artery Pseudoaneurysm    ;  Surgeon: Sherren Kerns, MD;  Location: [facility] OR;  Service: Vascular;  Laterality: Right;  . Coronary angioplasty with stent placement  01/17/2013    mild disease except 99% CFX, rx with  2.5 x 28 [place] drug-eluting stent   . Left heart catheterization with coronary angiogram N/A 01/18/2013    Procedure: LEFT HEART CATHETERIZATION WITH CORONARY ANGIOGRAM;  Surgeon: Corky Crafts, MD;  Location: [facility] CATH LAB;  Service: Cardiovascular;  Laterality: N/A;  . Percutaneous coronary stent intervention (pci-s)  01/18/2013    Procedure: PERCUTANEOUS  CORONARY STENT INTERVENTION (PCI-S);  Surgeon: Corky Crafts, MD;  Location: [facility] CATH LAB;  Service: Cardiovascular;;  . Left heart catheterization with coronary angiogram N/A 04/14/2013    Procedure: LEFT HEART CATHETERIZATION WITH CORONARY ANGIOGRAM;  Surgeon: Corky Crafts, MD;  Location: [facility] C[facility] LAB;  Service: Cardiovascular;  Laterality: N/A;  . Tee without cardioversion N/A 03/06/2014    Procedure: TRANSESOPHAGEAL ECHOCARDIOGRAM (TEE);  Surgeon: Lars Masson, MD;  Location: [facility] ENDOSCOPY;  Service: Cardiovascular;  Laterality: N/A;     Hospital Course See H&P, Labs, Consult and Test reports for all details in brief, patient was admitted for **  Principal Problem:   Syncope Active Problems:   HTN (hypertension)   CAD - CFX DES 01/17/13   Cardiomyopathy, ischemic- EF 40-45% by echo 01/18/13, improved in 04/2013 to 50-55%  Lindsay Camacho is a 49 y.o. female with history of ischemic cardiomyopathy last EF measured 3 days ago by transesophageal echocardiogram was 40-45%, which was showing moderate MR, with history of CAD status post drug-eluting stent last cardiac catheter April 2015 complicated by retroperitoneal hematoma, hyperlipidemia and hypertension was brought to the ER after patient had a syncopal episode while working. Patient stated that this evening while working patient felt dizzy and she sat down. Following which patient loss consciousness. She was waking up in the ambulance van. Denies any palpitations chest pain nausea vomiting abdominal pain diarrhea fever chills. Denies any tongue biting incontinence of urine.  Patient states that she took extra dose of her Lasix today morning after she was feeling short of breath. She did 2 extra doses than her regular dose of Lasix. Patient on exam is nonfocal. Initial CT head showed nonspecific finding and MRI of the brain was done which did not show any acute stroke. Patient has been admitted for further observation.  A shunt was  orthostatic from arthritis point of view, heart rate went up more than 20 from supine to standing position , patient diuresis was held during hospital stay.  - Patient wasn't orthostatic any further at day of discharge, she walked in  the hallway with no lightheadedness, dizziness, she was not orthostatic, saturating 98% on room air - Patient was instructed to resume her Lasix at home dose 20 mg oral daily, and to keep following with her cardiology as previously scheduled as an outpatient.  syncope  - Secondary to orthostasis from volume depletion, as she took extra Lasix at day of admission, improved with gentle hydration, and holding Lasix, not orthostatic anymore. - No arrhythmias on telemetry - 2-D echo just recently done with EF 40-45%, with moderate MR,. - No acute finding in CT head or MRI.  Coronary artery disease - Denies any chest pain, continue with Plavix and statin  Dyspnea - No evidence of volume overload on physical exam or CT chest, d-dimer is mildly elevated, but a CT chest negative for PE.  Ischemic cardiomyopathy with EF 40-45% - monitor closely as an gentle hydration, does not appear to be in volume overload  Hyperlipidemia - Continue with statin   Consults   none  Significant Tests:  See full reports for all details   Ct Head Wo Contrast  03/09/2014   CLINICAL DATA:  49 year old female with history of syncope.  EXAM: CT HEAD WITHOUT CONTRAST  TECHNIQUE: Contiguous axial images were obtained from the base of the skull through the vertex without intravenous contrast.  COMPARISON:  No priors.  FINDINGS: Small ill-defined focus of low attenuation in the right frontal lobe white matter immediately lateral to the head of the right caudate (image 13 of series 2), concerning for potential age-indeterminate ischemia. No acute intracranial abnormalities. Specifically, no evidence of acute intracranial hemorrhage, no mass, mass effect, hydrocephalus or abnormal intra or  extra-axial fluid collections. Visualized paranasal sinuses and mastoids are well pneumatized. No acute displaced skull fractures are identified.  IMPRESSION: 1. Small focus of age-indeterminate ischemia in the right frontal lobe white matter. While this may be old, the possibility of subacute ischemia is not excluded, and this could be better delineated with the brain MRI with and without IV gadolinium if clinically appropriate. 2. No other potential acute findings. These results were called by telephone at the time of interpretation on 03/09/2014 at 7:29 pm to Dr. Dione Booze, who verbally acknowledged these results.   Electronically Signed   By: Trudie Reed M.D.   On: 03/09/2014 19:32   Ct Angio Chest Pe W/cm &/or Wo Cm  03/10/2014   CLINICAL DATA:  Mid chest pain and discomfort.  Shortness of breath.  EXAM: CT ANGIOGRAPHY CHEST WITH CONTRAST  TECHNIQUE: Multidetector CT imaging of the chest was performed using the standard protocol during bolus administration of intravenous contrast. Multiplanar CT image reconstructions and MIPs were obtained to evaluate the vascular anatomy.  CONTRAST:  80mL OMNIPAQUE IOHEXOL 350 MG/ML SOLN  COMPARISON:  Chest radiograph 1 day prior.  FINDINGS: There are no filling defects within the pulmonary arteries to suggest pulmonary  embolus.  The thoracic aorta is normal in caliber. Coronary artery stent is seen. The heart is upper normal in size, probable left ventricular hypertrophy. There is no pleural or pericardial effusion. No mediastinal or hilar adenopathy.  The lungs are clear. There is no consolidation, pulmonary nodule or mass.  Evaluation of the upper abdomen demonstrates no acute abnormality. No acute or suspicious osseous abnormalities.  Review of the MIP images confirms the above findings.  IMPRESSION: 1. No pulmonary embolus. 2. No acute intrathoracic process. 3. Coronary artery stent.  Mild aortic atherosclerosis.   Electronically Signed   By: Rubye Oaks M.D.    On: 03/10/2014 06:57   Mr Brain Wo Contrast  03/09/2014   CLINICAL DATA:  Woke up feeling weak, short of breath and disoriented. Syncopal episode after taking 2 extra Lasix pills today.  EXAM: MRI HEAD WITHOUT CONTRAST  TECHNIQUE: Multiplanar, multiecho pulse sequences of the brain and surrounding structures were obtained without intravenous contrast.  COMPARISON:  CT of the head March 09, 2014 at 1912 hours  FINDINGS: Motion degraded examination, ranging from mild to moderate degraded sequences.  The ventricles and sulci are normal for patient's age. No mass lesions, mass effect. Subcentimeter focus of T2 bright signal in the RIGHT corona radiata corresponding to CT abnormality. Additional subtle areas of parenchymal signal abnormality may be undetected due to mild motion. No reduced diffusion to suggest acute ischemia. No susceptibility artifact to suggest hemorrhage. Possible LEFT medial parietal developmental venous anomaly. Dolicoectatic intracranial vessels suggests chronic hypertension.  No abnormal extra-axial fluid collections. No extra-axial masses though, contrast enhanced sequences would be more sensitive. Normal major intracranial vascular flow voids seen at the skull base.  Ocular globes and orbital contents are unremarkable though not tailored for evaluation. No abnormal sellar expansion. Visualized paranasal sinuses and mastoid air cells are well-aerated. No suspicious calvarial bone marrow signal. No abnormal sellar expansion. Craniocervical junction maintained.  IMPRESSION: Mild to moderately motion degraded examination.  No acute intracranial process, specifically no acute ischemia.  Sub cm RIGHT corona radiata lesion suggests remote ischemia, less likely demyelination.   Electronically Signed   By: Awilda Metro   On: 03/09/2014 22:03   Dg Chest Port 1 View  03/09/2014   CLINICAL DATA:  Syncope, loss is consciousness and shortness of breath.  EXAM: PORTABLE CHEST - 1 VIEW  COMPARISON:   01/08/2014  FINDINGS: The heart size and mediastinal contours are within normal limits. There is no evidence of pulmonary edema, consolidation, pneumothorax, nodule or pleural fluid. The visualized skeletal structures are unremarkable.  IMPRESSION: No active disease.   Electronically Signed   By: Irish Lack M.D.   On: 03/09/2014 18:38     Today   Subjective:   Lindsay Camacho today has no headache,no chest abdominal pain,no new weakness tingling or numbness, feels much better wants to go home today.  Objective:   Blood pressure 122/73, pulse 89, temperature 97.5 F (36.4 C), temperature source Oral, resp. rate 16, height 5\' 3"  (1.6 m), weight 49.714 kg (109 lb 9.6 oz), SpO2 98 %.  Intake/Output Summary (Last 24 hours) at 03/11/14 1543 Last data filed at 03/11/14 1300  Gross per 24 hour  Intake 1422.5 ml  Output   1200 ml  Net  222.5 ml    Exam Awake Alert, Oriented *3, No new F.N deficits, Normal affect [place].AT,PERRAL Supple Neck,No JVD, No cervical lymphadenopathy appriciated.  Symmetrical Chest wall movement, Good air movement bilaterally, CTAB RRR,No Gallops,Rubs , No Parasternal Heave +ve  B.Sounds, Abd Soft, Non tender, No organomegaly appriciated, No rebound -guarding or rigidity. No Cyanosis, Clubbing or edema, No new Rash or bruise  Data Review   Cultures -  CBC w Diff: Lab Results  Component Value Date   WBC 7.6 03/10/2014   HGB 15.9* 03/10/2014   HCT 47.4* 03/10/2014   PLT 205 03/10/2014   LYMPHOPCT 31 03/10/2014   MONOPCT 8 03/10/2014   EOSPCT 2 03/10/2014   BASOPCT 0 03/10/2014   CMP: Lab Results  Component Value Date   NA 142 03/11/2014   K 4.7 03/11/2014   CL 106 03/11/2014   CO2 30 03/11/2014   BUN 16 03/11/2014   CREATININE 0.90 03/11/2014   PROT 6.5 01/08/2014   ALBUMIN 3.3* 01/08/2014   BILITOT 0.7 01/08/2014   ALKPHOS 70 01/08/2014   AST 21 01/08/2014   ALT 16 01/08/2014  .  Micro Results No results found for this or any previous visit  (from the past 240 hour(s)).   Discharge Instructions        Discharge Medications     Medication List    TAKE these medications        acetaminophen 325 MG tablet  Commonly known as:  TYLENOL  Take 325 mg by mouth every 6 (six) hours as needed for moderate pain.     aspirin 81 MG tablet  Take 1 tablet (81 mg total) by mouth daily.     clopidogrel 75 MG tablet  Commonly known as:  PLAVIX  Take 1 tablet (75 mg total) by mouth daily with breakfast.     furosemide 20 MG tablet  Commonly known as:  LASIX  Take one tablet by mouth daily.  May take an extra 20 mg tablet daily as needed for shortness of breath or fluid overload.     ibuprofen 400 MG tablet  Commonly known as:  ADVIL,MOTRIN  Take 1 tablet (400 mg total) by mouth 3 (three) times daily. For 2 days then use as needed three times a day for chest wall pain.     isosorbide mononitrate 30 MG 24 hr tablet  Commonly known as:  IMDUR  Take 1 tablet (30 mg total) by mouth daily.     methocarbamol 500 MG tablet  Commonly known as:  ROBAXIN  Take 2 tablets (1,000 mg total) by mouth every 8 (eight) hours as needed for muscle spasms.     nitroGLYCERIN 0.4 MG SL tablet  Commonly known as:  NITROSTAT  Place 1 tablet (0.4 mg total) under the tongue every 5 (five) minutes x 3 doses as needed for chest pain.     potassium chloride 10 MEQ tablet  Commonly known as:  K-DUR  Take 1 tablet (10 mEq total) by mouth daily.  Start taking on:  03/13/2014     rosuvastatin 10 MG tablet  Commonly known as:  CRESTOR  Take 1 tablet (10 mg total) by mouth daily.         Total Time in preparing paper work, data evaluation and todays exam - 35 minutes  Lindsay Camacho M.D on 03/11/2014 at 3:43 PM  [facility] Office  [phone]

## 2014-03-11 NOTE — Progress Notes (Signed)
1540 Cardiac Rehab Discussed Outpt. CRP with pt. She is very interested in attending, but her issue is transportation to get here. She does not drive and has to count on others to get her to appointments. Pt is interested and ask that we give her a call at home and she will see if she can get something worked out to try to get here. We will follow up with pt. I gave her information on the cardiac rehab program. Beatrix Fetters, RN 03/11/2014 4:18 PM

## 2014-03-12 NOTE — Telephone Encounter (Signed)
Please have patient avoid repetitive arm movements. 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

## 2014-03-12 NOTE — Telephone Encounter (Signed)
Follow up      Need note for work stating her new restrictions. She needs to stop using her arms in a repetitive motion and be in a less stress environment. Call pt when note is ready.

## 2014-03-12 NOTE — Telephone Encounter (Signed)
Left message to call back  

## 2014-03-12 NOTE — Telephone Encounter (Signed)
Informed pt that Dr. Eldridge Dace had not stated any new restrictions at this time but had asked what she could do at her job that was less strenuous. 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 Informed pt that I would route this information to Dr. Eldridge Dace for review and advisement. Pt verbalized understanding and was in agreement with this plan.

## 2014-03-13 ENCOUNTER — Telehealth: Payer: Self-pay | Admitting: Interventional Cardiology

## 2014-03-13 NOTE — Telephone Encounter (Signed)
New Message        Pt calling stating that Dr. Eldridge Dace was supposed to be working on a letter for pt's work restrictions and pt was calling to give her employers name and fax information. [employer] Fax :[phone] Please call pt back and advise.

## 2014-03-16 ENCOUNTER — Encounter: Payer: Self-pay | Admitting: *Deleted

## 2014-03-16 NOTE — Telephone Encounter (Signed)
F/U ° ° ° ° ° ° ° ° ° °Pt returning call. Please call back.  °

## 2014-03-16 NOTE — Telephone Encounter (Signed)
Informed pt that letter was ready. Pt asked that I fax it to [phone] in attn to [employer] or [employer]. Asked pt if she would like copy mailed to her as well. Pt said she would, verified address. Pt verbalized understanding and was in agreement with this plan.

## 2014-03-16 NOTE — Telephone Encounter (Signed)
Left message to call back  

## 2014-03-16 NOTE — Telephone Encounter (Signed)
F/U      Pt calling back in regards to letter.     Please call.

## 2014-03-16 NOTE — Telephone Encounter (Signed)
Informed pt that letter was ready.  Pt asked that I fax to her employer.  Asked pt if she would like me to mail a copy, pt said yes. Verified address. Pt verbalized understanding and was in agreement with this plan.

## 2014-03-17 ENCOUNTER — Telehealth: Payer: Self-pay | Admitting: Interventional Cardiology

## 2014-03-17 NOTE — Telephone Encounter (Signed)
Please advise 

## 2014-03-17 NOTE — Telephone Encounter (Signed)
New Msg        Becky Neighbors from the [employer],  Would like to get clarifications on what restrictions the pt has.   What is considered continuous, repetitive arm motion? Pt scheduled to come back to work today and they don't want her to get injured.   Please call at [phone] ext [phone].

## 2014-03-17 NOTE — Telephone Encounter (Signed)
I spoke with the patient. Confirmed fax # with her again to where her letter needs to be faxed. [phone] (fax). I re-faxed the letter to this # - confirmation received. Called [employer] [phone] and confirmed with Human resources that they received the fax.  The patient is aware.

## 2014-03-17 NOTE — Telephone Encounter (Signed)
New message      Pt states her job did not get a return to work note yesterday.  Please call

## 2014-03-17 NOTE — Telephone Encounter (Signed)
Left message to call back. Need to find out if pt has a PCP.

## 2014-03-18 ENCOUNTER — Telehealth: Payer: Self-pay | Admitting: Interventional Cardiology

## 2014-03-18 NOTE — Telephone Encounter (Signed)
Pt aware of ROI & Pmt that needs to be made before FMLA can be competed. 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 She asked me to hold onto the paperwork until she can make it into the office to get this done.

## 2014-03-18 NOTE — Telephone Encounter (Signed)
Follow up  ° ° °Patient returning call back to nurse  °

## 2014-03-18 NOTE — Telephone Encounter (Signed)
Called pt back and she stated that someone had called her back about her FMLA papers and told her she had to pay. Informed her that was Selena Batten from our medical records dept. Pt stated that she was going in to talk to her employer at this time and that she would call Kim back when she was done.

## 2014-03-18 NOTE — Telephone Encounter (Signed)
LVM for patient awaiting call back to explain about FMLA process.

## 2014-03-19 ENCOUNTER — Telehealth: Payer: Self-pay | Admitting: Interventional Cardiology

## 2014-03-19 NOTE — Telephone Encounter (Signed)
New Message  Becky from pt's employer [employer] following up about disability paperwork sent in to be filled out yesterday 3/16. Please call back and discuss.

## 2014-03-23 NOTE — Telephone Encounter (Signed)
Returned call to [employer] and informed her that we were waiting for paperwork to be returned from an outside company that fills paperwork out and returns to [place]. Becky verbalized understanding.

## 2014-03-23 NOTE — Telephone Encounter (Signed)
F/U       Becky returning call from pt Employer, HR.    Please return call, states she will be at desk for the rest of the day.     1. Are you calling in reference to your FMLA or disability form?  Yes FMLA  2. What is your question in regards to FMLA or disability form? Pt needs this to be started right away to receive income   3. Do you need copies of your medical records? No  4. Are you waiting on a nurse to call you back with results or are you wanting copies of your results? Pt employer requesting for information to be faxed to  [phone].  Please contact Becky in HR if any questions. [phone] ext [phone].

## 2014-03-23 NOTE — Telephone Encounter (Signed)
Returned pt's call in regards to weight gain. Pt states that she has gained 10 pounds in 2-3 days. Pt states that she has very slight SOB. No SOB noticed during call. Pt states that she has slight edema in hands but not in legs. Pt states that she has no had any increased salt in her diet. Pt states that she has slight chest tightness at this time but says that this is a normal, daily thing for her due to her job. Pt states that she has taken one and a half tablets of her Lasix 20mg  for the last two days. Pt has not taken her BP today and states that she is out of the house right now and unable to take it at this time. Informed pt that I would route this information to Dr. Eldridge Dace for review and advisement. Pt verbalized understanding and was in agreement with this plan.

## 2014-03-23 NOTE — Telephone Encounter (Signed)
Left message to call back  

## 2014-03-23 NOTE — Telephone Encounter (Signed)
New Message         Pt calling stating that she gained 10 lbs over the past 2 days. Please call back and advise.

## 2014-03-24 NOTE — Telephone Encounter (Signed)
Can increase Lasix to 60 mg one day and then to 40 mg daily until she sees weight loss or feels less fluid in her system.

## 2014-03-24 NOTE — Telephone Encounter (Signed)
Left message to call back  

## 2014-03-24 NOTE — Telephone Encounter (Signed)
Pt returned call. Informed pt of Dr. Hoyle Barr suggestion to increase Lasix to 60mg  one day and then to 40mg  daily until she sees weight loss or feels less fluid in her system. Pt verbalized understanding and was in agreement with this plan.

## 2014-03-31 ENCOUNTER — Telehealth: Payer: Self-pay | Admitting: Interventional Cardiology

## 2014-03-31 NOTE — Telephone Encounter (Signed)
New Message   Patient is calling to check on her FMLA paperwork and she would like a call back about it because she has not heard anything back on it.   Please give patient a call.   Thanks

## 2014-03-31 NOTE — Telephone Encounter (Signed)
Routed to Kim in medical records 

## 2014-04-01 NOTE — Telephone Encounter (Signed)
After speaking with [employer] on 3.29.16 pt made her check out to "[employer]" and not [employer] per Malena Catholic in  [employer] the pt's check along with a new ROI was mailed back to pt. I called and spoke with pt 3.29.16 and explained to her what happen and what needed to be done in order to get this fixed correctly. Pt understood and stated when she can get another ride she will come and get this Payment taken care Of.  I have also spoke with Victorino Dike B  and she is aware of what's going on also.

## 2014-04-13 NOTE — Telephone Encounter (Signed)
error 

## 2014-04-15 ENCOUNTER — Telehealth: Payer: Self-pay | Admitting: Interventional Cardiology

## 2014-04-15 NOTE — Telephone Encounter (Signed)
LVM for pt FMLA is ready for pick.

## 2014-04-16 ENCOUNTER — Telehealth: Payer: Self-pay | Admitting: Interventional Cardiology

## 2014-04-16 NOTE — Telephone Encounter (Signed)
Walk-In pt Form " Pt needs RX refills" to to refill Dept

## 2014-04-17 ENCOUNTER — Other Ambulatory Visit: Payer: Self-pay

## 2014-04-17 MED ORDER — FUROSEMIDE 20 MG PO TABS: ORAL_TABLET | ORAL | Status: DC

## 2014-04-21 ENCOUNTER — Telehealth: Payer: Self-pay | Admitting: Interventional Cardiology

## 2014-04-21 NOTE — Telephone Encounter (Signed)
Spoke with Maryann/[employer] verified fax number to send Questionnaire to faxed to [phone]

## 2014-04-21 NOTE — Telephone Encounter (Signed)
New message      Pt needs work forms completed and faxed to her job ASAP.  Do you think he could complete the forms before going away on vacation 04-23-14?

## 2014-04-21 NOTE — Telephone Encounter (Signed)
Left message to call back  

## 2014-04-22 NOTE — Telephone Encounter (Signed)
Left message to call back  

## 2014-04-23 ENCOUNTER — Encounter: Payer: Self-pay | Admitting: *Deleted

## 2014-04-23 NOTE — Telephone Encounter (Signed)
Left message to call back  

## 2014-04-23 NOTE — Telephone Encounter (Signed)
Kim from medical records sent paperwork over to employer. Paperwork was completed on 04/21/14 by Dr. Eldridge Dace. Pt informed that paperwork was sent over. While speaking to Selena Batten, pt stated that she needed a letter sent to [facility] DSS stating that she required heating and air even in mild conditions due to her health. Spoke with Dr. Eldridge Dace and he stated that it was ok to send over a letter stating that pt should not be in extreme heat or extreme cold. Informed pt that I would send this letter over to [facility] DSS. Pt verbalized understanding.

## 2014-04-27 ENCOUNTER — Encounter: Payer: Self-pay | Admitting: *Deleted

## 2014-05-26 ENCOUNTER — Other Ambulatory Visit: Payer: Self-pay

## 2014-05-26 MED ORDER — ISOSORBIDE MONONITRATE ER 30 MG PO TB24: 30.0000 mg | ORAL_TABLET | Freq: Every day | ORAL | Status: DC

## 2014-05-28 ENCOUNTER — Other Ambulatory Visit (INDEPENDENT_AMBULATORY_CARE_PROVIDER_SITE_OTHER): Payer: BLUE CROSS/BLUE SHIELD | Admitting: *Deleted

## 2014-05-28 DIAGNOSIS — E785 Hyperlipidemia, unspecified: Secondary | ICD-10-CM

## 2014-05-28 LAB — HEPATIC FUNCTION PANEL
ALT: 23 U/L (ref 0–35)
AST: 32 U/L (ref 0–37)
Albumin: 4.1 g/dL (ref 3.5–5.2)
Alkaline Phosphatase: 96 U/L (ref 39–117)
BILIRUBIN DIRECT: 0.1 mg/dL (ref 0.0–0.3)
BILIRUBIN TOTAL: 0.5 mg/dL (ref 0.2–1.2)
Total Protein: 7.7 g/dL (ref 6.0–8.3)

## 2014-05-28 LAB — LIPID PANEL
Cholesterol: 218 mg/dL — ABNORMAL HIGH (ref 0–200)
HDL: 47.3 mg/dL (ref >39.00)
LDL Cholesterol: 141 mg/dL — ABNORMAL HIGH (ref 0–99)
NONHDL: 170.7
Total CHOL/HDL Ratio: 5
Triglycerides: 151 mg/dL — ABNORMAL HIGH (ref 0.0–149.0)
VLDL: 30.2 mg/dL (ref 0.0–40.0)

## 2014-06-02 ENCOUNTER — Other Ambulatory Visit: Payer: Self-pay | Admitting: *Deleted

## 2014-06-02 DIAGNOSIS — E785 Hyperlipidemia, unspecified: Secondary | ICD-10-CM

## 2014-06-02 MED ORDER — ROSUVASTATIN CALCIUM 20 MG PO TABS: 20.0000 mg | ORAL_TABLET | Freq: Every day | ORAL | Status: DC

## 2014-06-03 ENCOUNTER — Telehealth: Payer: Self-pay | Admitting: Interventional Cardiology

## 2014-06-03 MED ORDER — ATORVASTATIN CALCIUM 40 MG PO TABS: 40.0000 mg | ORAL_TABLET | Freq: Every day | ORAL | Status: DC

## 2014-06-03 NOTE — Telephone Encounter (Signed)
New Message       Pt calling stating that a prescription was called in for her for Crestor and she can't afford it. Pt states she is not working and has no insurance and it is $600. Pt wants to know if there is a generic or if she can be prescribed something else. Please call back and advise.

## 2014-06-03 NOTE — Telephone Encounter (Signed)
Called and spoke with the pharmacy. Pharmacy states that a 90 day supply is $600+ and a 30 day supply is $231. Pharmacy states that Lovastatin 10mg  or 20mg  is available on their $4 list. Spoke with pt and informed her of this information and that I would send it to Dr. Eldridge Dace for review and advisement. Pt verbalized understanding and was in agreement with this plan.

## 2014-06-03 NOTE — Telephone Encounter (Signed)
Ok to try atorvastatin 40 mg daily

## 2014-06-03 NOTE — Telephone Encounter (Signed)
Spoke with pt and informed her of new order for Atorvastatin 40mg  once daily. Verified pharmacy. Pt verbalized understanding and was in agreement with this plan.

## 2014-06-04 ENCOUNTER — Telehealth: Payer: Self-pay | Admitting: Interventional Cardiology

## 2014-06-04 NOTE — Telephone Encounter (Signed)
Looked on [facility] $4 list online and the only $4 medication is Lovastatin 10mg  or 20mg . Will forward to Dr. Eldridge Dace for review and advisement.

## 2014-06-04 NOTE — Telephone Encounter (Signed)
New message      Whatever the new medication Dr V called in recently, pt cannot afford it also.  It is $90.00 and she did not pick it up.  Is there anything $4.00 that he can call in?

## 2014-06-05 NOTE — Telephone Encounter (Signed)
Follow up       Pt c/o BP issue: STAT if pt c/o blurred vision, one-sided weakness or slurred speech  1. What are your last 5 BP readings? 88/62 HR 102, 96/67 HR 107  2. Are you having any other symptoms (ex. Dizziness, headache, blurred vision, passed out)? Fatigue, chest discomfort for last 3 days-  3. What is your BP issue? Pt feels like her bp is too low AND, she is having chest discomfort now

## 2014-06-05 NOTE — Telephone Encounter (Signed)
Pt called c/o of chest discomfort for the last 3 days and it has not had any difficulty with ADL's or sleeping.  There is not pain just discomfort under the left arm.  Pt stated thought her BP has been low she takes her medications as ordered and wants to know what she needs to do. Spoke with MD, pt should go to the ED to be evaluated.  Called pt back and left message to call back.

## 2014-06-05 NOTE — Telephone Encounter (Signed)
Follow up ° ° ° ° °Returning a nurses call °

## 2014-06-05 NOTE — Telephone Encounter (Signed)
Pt returned Kim's call.  Spoke with pt and informed her that Dr. Eldridge Dace suggested that pt go to ED for evaluation. Pt verbalized understanding and was in agreement. Also informed pt that I would contact her in regards to cholesterol medication and she stated that someone let her borrow the money and that she was able to pick up current prescription of Atorvastatin and preferred to stay on that. Informed pt that was fine and that I would make Dr. Eldridge Dace aware that pt no longer needs to change medication. Pt verbalized understanding and was in agreement.

## 2014-06-16 ENCOUNTER — Other Ambulatory Visit: Payer: Self-pay

## 2014-06-16 MED ORDER — FUROSEMIDE 20 MG PO TABS: ORAL_TABLET | ORAL | Status: DC

## 2014-06-16 MED ORDER — POTASSIUM CHLORIDE ER 10 MEQ PO TBCR: 10.0000 meq | EXTENDED_RELEASE_TABLET | Freq: Every day | ORAL | Status: DC

## 2014-06-19 ENCOUNTER — Other Ambulatory Visit: Payer: Self-pay

## 2014-06-19 MED ORDER — CLOPIDOGREL BISULFATE 75 MG PO TABS: 75.0000 mg | ORAL_TABLET | Freq: Every day | ORAL | Status: DC

## 2014-07-13 ENCOUNTER — Encounter: Payer: Self-pay | Admitting: *Deleted

## 2014-08-25 ENCOUNTER — Telehealth: Payer: Self-pay | Admitting: Interventional Cardiology

## 2014-08-25 NOTE — Telephone Encounter (Signed)
Spoke with pt ,she states that yesterday evening felt a little woozy she preceded to lay down and then took her BP which it was 76/51. This AM her BP was Lying down 113/70. Her BP now is 105/74 HR 92 beats/minute. Pt feels like she has constant pain, soreness. Across the chest left to right of chest. Pt states it feels like muscle soreness. Pt was encouraged to take tylenol for that. Pt states "I though it was soreness". Pt was made aware that her 6 month F/U visit was overdue. An appointment was made with Jacolyn Reedy PA for September 19 th at 11:15 AM. Pt verbalized understanding.

## 2014-08-25 NOTE — Telephone Encounter (Signed)
Pt BP was 76/51 about 530pm last night laying down, this am 113/70 laying down-also starting having some pain in her chest about 730a took Nitro and hasn't had any pain since pls advise [phone]

## 2014-08-28 ENCOUNTER — Other Ambulatory Visit: Payer: BLUE CROSS/BLUE SHIELD

## 2014-09-17 ENCOUNTER — Encounter (HOSPITAL_COMMUNITY): Payer: Self-pay | Admitting: Cardiology

## 2014-09-17 ENCOUNTER — Inpatient Hospital Stay (HOSPITAL_COMMUNITY)
Admission: AD | Admit: 2014-09-17 | Discharge: 2014-09-22 | DRG: 287 | Disposition: A | Discharge status: Home / Self Care | Payer: BLUE CROSS/BLUE SHIELD | Source: Other Acute Inpatient Hospital | Attending: Cardiovascular Disease | Admitting: Cardiovascular Disease

## 2014-09-17 DIAGNOSIS — I25119 Atherosclerotic heart disease of native coronary artery with unspecified angina pectoris: Secondary | ICD-10-CM | POA: Diagnosis not present

## 2014-09-17 DIAGNOSIS — I10 Essential (primary) hypertension: Secondary | ICD-10-CM | POA: Diagnosis not present

## 2014-09-17 DIAGNOSIS — I340 Nonrheumatic mitral (valve) insufficiency: Secondary | ICD-10-CM | POA: Diagnosis not present

## 2014-09-17 DIAGNOSIS — I5022 Chronic systolic (congestive) heart failure: Secondary | ICD-10-CM | POA: Diagnosis not present

## 2014-09-17 DIAGNOSIS — F17210 Nicotine dependence, cigarettes, uncomplicated: Secondary | ICD-10-CM | POA: Diagnosis not present

## 2014-09-17 DIAGNOSIS — Z8673 Personal history of transient ischemic attack (TIA), and cerebral infarction without residual deficits: Secondary | ICD-10-CM | POA: Diagnosis not present

## 2014-09-17 DIAGNOSIS — Z720 Tobacco use: Secondary | ICD-10-CM | POA: Diagnosis present

## 2014-09-17 DIAGNOSIS — R072 Precordial pain: Secondary | ICD-10-CM | POA: Diagnosis not present

## 2014-09-17 DIAGNOSIS — Z955 Presence of coronary angioplasty implant and graft: Secondary | ICD-10-CM | POA: Diagnosis not present

## 2014-09-17 DIAGNOSIS — I200 Unstable angina: Secondary | ICD-10-CM | POA: Diagnosis present

## 2014-09-17 DIAGNOSIS — I252 Old myocardial infarction: Secondary | ICD-10-CM

## 2014-09-17 DIAGNOSIS — R1032 Left lower quadrant pain: Secondary | ICD-10-CM

## 2014-09-17 DIAGNOSIS — E785 Hyperlipidemia, unspecified: Secondary | ICD-10-CM | POA: Diagnosis not present

## 2014-09-17 DIAGNOSIS — R079 Chest pain, unspecified: Secondary | ICD-10-CM | POA: Diagnosis present

## 2014-09-17 DIAGNOSIS — I447 Left bundle-branch block, unspecified: Secondary | ICD-10-CM | POA: Diagnosis present

## 2014-09-17 DIAGNOSIS — I2510 Atherosclerotic heart disease of native coronary artery without angina pectoris: Secondary | ICD-10-CM | POA: Diagnosis not present

## 2014-09-17 DIAGNOSIS — I639 Cerebral infarction, unspecified: Secondary | ICD-10-CM

## 2014-09-17 LAB — CBC
HEMATOCRIT: 43.8 % (ref 36.0–46.0)
HEMOGLOBIN: 14.3 g/dL (ref 12.0–15.0)
MCH: 29.8 pg (ref 26.0–34.0)
MCHC: 32.6 g/dL (ref 30.0–36.0)
MCV: 91.3 fL (ref 78.0–100.0)
Platelets: 206 10*3/uL (ref 150–400)
RBC: 4.8 MIL/uL (ref 3.87–5.11)
RDW: 14.3 % (ref 11.5–15.5)
WBC: 7.4 10*3/uL (ref 4.0–10.5)

## 2014-09-17 LAB — CREATININE, SERUM
Creatinine, Ser: 1 mg/dL (ref 0.44–1.00)
GFR calc non Af Amer: >60 mL/min (ref >60)

## 2014-09-17 LAB — TROPONIN I

## 2014-09-17 LAB — PROTIME-INR
INR: 1.1 (ref 0.00–1.49)
PROTHROMBIN TIME: 14.4 s (ref 11.6–15.2)

## 2014-09-17 LAB — T4, FREE: FREE T4: 0.93 ng/dL (ref 0.61–1.12)

## 2014-09-17 LAB — TSH: TSH: 4.039 u[IU]/mL (ref 0.350–4.500)

## 2014-09-17 MED ORDER — FUROSEMIDE 10 MG/ML IJ SOLN
20.0000 mg | Freq: Once | INTRAMUSCULAR | Status: AC
  Administered 2014-09-17: 20 mg via INTRAVENOUS
  Filled 2014-09-17: qty 2

## 2014-09-17 MED ORDER — METOPROLOL TARTRATE 12.5 MG HALF TABLET
12.5000 mg | ORAL_TABLET | Freq: Two times a day (BID) | ORAL | Status: DC
Start: 2014-09-17 — End: 2014-09-22
  Administered 2014-09-17 – 2014-09-22 (×8): 12.5 mg via ORAL
  Filled 2014-09-17 (×10): qty 1

## 2014-09-17 MED ORDER — FUROSEMIDE 20 MG PO TABS
20.0000 mg | ORAL_TABLET | Freq: Every day | ORAL | Status: DC
  Administered 2014-09-18 – 2014-09-22 (×5): 20 mg via ORAL
  Filled 2014-09-17 (×5): qty 1

## 2014-09-17 MED ORDER — ACETAMINOPHEN 325 MG PO TABS: 325.0000 mg | ORAL_TABLET | Freq: Four times a day (QID) | ORAL | Status: DC | PRN

## 2014-09-17 MED ORDER — ASPIRIN 81 MG PO CHEW
324.0000 mg | CHEWABLE_TABLET | ORAL | Status: AC
  Administered 2014-09-17: 324 mg via ORAL
  Filled 2014-09-17: qty 4

## 2014-09-17 MED ORDER — ZOLPIDEM TARTRATE 5 MG PO TABS
5.0000 mg | ORAL_TABLET | Freq: Every evening | ORAL | Status: DC | PRN
  Administered 2014-09-19 – 2014-09-21 (×3): 5 mg via ORAL
  Filled 2014-09-17 (×3): qty 1

## 2014-09-17 MED ORDER — ASPIRIN 300 MG RE SUPP: 300.0000 mg | RECTAL | Status: AC

## 2014-09-17 MED ORDER — MORPHINE SULFATE (PF) 2 MG/ML IV SOLN
2.0000 mg | INTRAVENOUS | Status: DC | PRN
  Administered 2014-09-17 – 2014-09-22 (×15): 2 mg via INTRAVENOUS
  Filled 2014-09-17 (×15): qty 1

## 2014-09-17 MED ORDER — ACETAMINOPHEN 325 MG PO TABS
650.0000 mg | ORAL_TABLET | ORAL | Status: DC | PRN
  Administered 2014-09-20: 650 mg via ORAL
  Filled 2014-09-17: qty 2

## 2014-09-17 MED ORDER — POTASSIUM CHLORIDE ER 10 MEQ PO TBCR
10.0000 meq | EXTENDED_RELEASE_TABLET | Freq: Every day | ORAL | Status: DC
  Administered 2014-09-17 – 2014-09-22 (×6): 10 meq via ORAL
  Filled 2014-09-17 (×12): qty 1

## 2014-09-17 MED ORDER — ALPRAZOLAM 0.25 MG PO TABS
0.2500 mg | ORAL_TABLET | Freq: Two times a day (BID) | ORAL | Status: DC | PRN
  Administered 2014-09-19 – 2014-09-22 (×5): 0.25 mg via ORAL
  Filled 2014-09-17 (×3): qty 1

## 2014-09-17 MED ORDER — CLOPIDOGREL BISULFATE 75 MG PO TABS
75.0000 mg | ORAL_TABLET | Freq: Every day | ORAL | Status: DC
  Administered 2014-09-18 – 2014-09-22 (×5): 75 mg via ORAL
  Filled 2014-09-17 (×5): qty 1

## 2014-09-17 MED ORDER — ONDANSETRON HCL 4 MG/2ML IJ SOLN
4.0000 mg | Freq: Four times a day (QID) | INTRAMUSCULAR | Status: DC | PRN
  Administered 2014-09-18 – 2014-09-19 (×2): 4 mg via INTRAVENOUS
  Filled 2014-09-17 (×2): qty 2

## 2014-09-17 MED ORDER — NITROGLYCERIN 2 % TD OINT
1.0000 [in_us] | TOPICAL_OINTMENT | Freq: Three times a day (TID) | TRANSDERMAL | Status: DC
  Administered 2014-09-17 – 2014-09-22 (×14): 1 [in_us] via TOPICAL
  Filled 2014-09-17: qty 30

## 2014-09-17 MED ORDER — ENOXAPARIN SODIUM 40 MG/0.4ML ~~LOC~~ SOLN
40.0000 mg | SUBCUTANEOUS | Status: DC
  Administered 2014-09-17 – 2014-09-21 (×5): 40 mg via SUBCUTANEOUS
  Filled 2014-09-17 (×5): qty 0.4

## 2014-09-17 MED ORDER — SODIUM CHLORIDE 0.9 % IJ SOLN: 3.0000 mL | INTRAMUSCULAR | Status: DC | PRN

## 2014-09-17 MED ORDER — NITROGLYCERIN 0.4 MG SL SUBL: 0.4000 mg | SUBLINGUAL_TABLET | SUBLINGUAL | Status: DC | PRN

## 2014-09-17 MED ORDER — SODIUM CHLORIDE 0.9 % IJ SOLN
3.0000 mL | Freq: Two times a day (BID) | INTRAMUSCULAR | Status: DC
Start: 2014-09-17 — End: 2014-09-22
  Administered 2014-09-17 – 2014-09-18 (×2): 3 mL via INTRAVENOUS
  Administered 2014-09-18: 10 mL via INTRAVENOUS
  Administered 2014-09-19 – 2014-09-21 (×4): 3 mL via INTRAVENOUS

## 2014-09-17 MED ORDER — SODIUM CHLORIDE 0.9 % IV SOLN: 250.0000 mL | INTRAVENOUS | Status: DC | PRN

## 2014-09-17 MED ORDER — ATORVASTATIN CALCIUM 40 MG PO TABS: 40.0000 mg | ORAL_TABLET | Freq: Every day | ORAL | Status: DC

## 2014-09-17 MED ORDER — ASPIRIN EC 81 MG PO TBEC
81.0000 mg | DELAYED_RELEASE_TABLET | Freq: Every day | ORAL | Status: DC
  Administered 2014-09-18 – 2014-09-22 (×4): 81 mg via ORAL
  Filled 2014-09-17 (×5): qty 1

## 2014-09-17 MED ORDER — ATORVASTATIN CALCIUM 40 MG PO TABS
40.0000 mg | ORAL_TABLET | Freq: Every day | ORAL | Status: DC
  Administered 2014-09-17 – 2014-09-21 (×5): 40 mg via ORAL
  Filled 2014-09-17 (×5): qty 1

## 2014-09-17 MED ORDER — NICOTINE 14 MG/24HR TD PT24
14.0000 mg | MEDICATED_PATCH | Freq: Every day | TRANSDERMAL | Status: DC
  Administered 2014-09-17 – 2014-09-22 (×5): 14 mg via TRANSDERMAL
  Filled 2014-09-17 (×6): qty 1

## 2014-09-17 MED ORDER — KETOROLAC TROMETHAMINE 30 MG/ML IJ SOLN
30.0000 mg | Freq: Four times a day (QID) | INTRAMUSCULAR | Status: AC
  Administered 2014-09-17 – 2014-09-20 (×11): 30 mg via INTRAVENOUS
  Filled 2014-09-17 (×10): qty 1

## 2014-09-17 MED ORDER — METHOCARBAMOL 500 MG PO TABS
1000.0000 mg | ORAL_TABLET | Freq: Three times a day (TID) | ORAL | Status: DC | PRN
  Administered 2014-09-19: 1000 mg via ORAL
  Filled 2014-09-17: qty 2

## 2014-09-17 NOTE — H&P (Signed)
Lindsay Camacho is an 49 y.o. female.    Primary Cardiologist:  Dr. Eldridge Dace No PCP Per Patient  Chief Complaint: chest pain- transfer from [facility].  HPI: 49 year old female with hx of ICM last check EF 40-45% by TEE and mod. MR and CAD with hx lat wall MI in 01/2013, with Alpine, DES placed to LCX.  Later she had recurrent chest pain 04/2013 and had cath with widely patent stent and LM, LAD and mild disease in RCA.  EF 50%.  Post procedure she developed retroperitoneal bleed with emergent surgery with evacuation of rt retroperitoneal hematoma and repair of rt external iliac artery.     Chest pain 1.2016 neg MI- muscular skeletal.  Echo done then with drop in EF to 35-40%, followed by TEE with results as above.   Now transferred from [facility] for chest pain and SOB.  She had been doing well until awakened from sleep this AM at 0500 due to SOB and then Chest pain, Lt axillary that came around.  No nausea, some diaphoresis.  Pain originally 10/10, with NTG SL improved not resolved but tolerable then returned around 11 A, another NTG unsure if it helped but went to ER.  Currently 6/10 pain, increased with deep breath.  Has NTG paste on without much help.    Has other complaints of waves of unease over eyes, though no pain or SOB then.    Recently with low BP and was to be seen in the office 09/21/14.  But no chest pain or SOB until today.   EKG: SR LBBB no acute changes.   CXR no acute cardiopulmonary process, low lung volumes and bibasilar atelectasis.  CBC, coags, WNL, troponin < 0.01, pro bnp 945 Cr 1.00K+ 3.8 labs normal.   She continues to smoke 1/2 ppd. Trying to stop.    Past Medical History  Diagnosis Date  . Hypertension   . Stroke     tia with no deficits  . CAD (coronary artery disease) 11/13/2011    50% ? proximal LCx stenosis per Cath at [facility]  . PVD (peripheral vascular disease)     Right CFA stenosis per report on 11/14/2011  . HLD (hyperlipidemia)   . LBBB  (left bundle branch block)   . Acute myocardial infarction of other lateral wall, initial episode of care 01/2013    DES CFX  . Intermediate coronary syndrome   . Chest pain, atypical, muscular skelatal   01/09/2014    Past Surgical History  Procedure Laterality Date  . Cardiac catheterization  04/14/2013    Non-obstructive disease, patent CFX stent  . Tubal ligation    . Appendectomy    . Groin debridement Right 04/14/2013    Procedure: Emergency Evacuation of Retroperitoneal Hematoma and Repair Right External Iliac Artery Pseudoaneurysm    ;  Surgeon: Sherren Kerns, MD;  Location: [facility] OR;  Service: Vascular;  Laterality: Right;  . Coronary angioplasty with stent placement  01/17/2013    mild disease except 99% CFX, rx with  2.5 x 28 Alpine drug-eluting stent   . Left heart catheterization with coronary angiogram N/A 01/18/2013    Procedure: LEFT HEART CATHETERIZATION WITH CORONARY ANGIOGRAM;  Surgeon: Corky Crafts, MD;  Location: [facility] CATH LAB;  Service: Cardiovascular;  Laterality: N/A;  . Percutaneous coronary stent intervention (pci-s)  01/18/2013    Procedure: PERCUTANEOUS CORONARY STENT INTERVENTION (PCI-S);  Surgeon: Corky Crafts, MD;  Location: [facility] CATH  LAB;  Service: Cardiovascular;;  . Left heart catheterization with coronary angiogram N/A 04/14/2013    Procedure: LEFT HEART CATHETERIZATION WITH CORONARY ANGIOGRAM;  Surgeon: Corky Crafts, MD;  Location: [facility] CATH LAB;  Service: Cardiovascular;  Laterality: N/A;  . Tee without cardioversion N/A 03/06/2014    Procedure: TRANSESOPHAGEAL ECHOCARDIOGRAM (TEE);  Surgeon: Lars Masson, MD;  Location: [facility] ENDOSCOPY;  Service: Cardiovascular;  Laterality: N/A;    Family History  Problem Relation Age of Onset  . CAD Mother 42  . Lung cancer Mother   . Bladder Cancer Mother   . Stroke Mother   . Cancer Mother     Lung and Bladder  . Heart disease Mother     Before age 52  . Hypertension Mother   . Heart attack  Mother   . CAD Father 51  . Heart disease Father     Before age 69  . Heart attack Father   . Stroke Father     Bleeding stroke   Social History:  reports that she has been smoking.  She has never used smokeless tobacco. She reports that she uses illicit drugs (Marijuana). She reports that she does not drink alcohol.  Allergies:  Allergies  Allergen Reactions  . Brilinta [Ticagrelor] Other (See Comments)    Dyspnea  . Other Other (See Comments)    BP med ? BP bottomed out  . Penicillins Rash    Medications Prior to Admission  Medication Sig Dispense Refill  . acetaminophen (TYLENOL) 325 MG tablet Take 325 mg by mouth every 6 (six) hours as needed for moderate pain.    Marland Kitchen aspirin 81 MG tablet Take 1 tablet (81 mg total) by mouth daily. 30 tablet   . atorvastatin (LIPITOR) 40 MG tablet Take 1 tablet (40 mg total) by mouth daily. 90 tablet 3  . clopidogrel (PLAVIX) 75 MG tablet Take 1 tablet (75 mg total) by mouth daily with breakfast. 90 tablet 1  . furosemide (LASIX) 20 MG tablet Take one tablet by mouth daily.  May take an extra 20 mg tablet daily as needed for shortness of breath or fluid overload. 60 tablet 5  . ibuprofen (ADVIL,MOTRIN) 400 MG tablet Take 1 tablet (400 mg total) by mouth 3 (three) times daily. For 2 days then use as needed three times a day for chest wall pain. 30 tablet 0  . isosorbide mononitrate (IMDUR) 30 MG 24 hr tablet Take 1 tablet (30 mg total) by mouth daily. 90 tablet 2  . methocarbamol (ROBAXIN) 500 MG tablet Take 2 tablets (1,000 mg total) by mouth every 8 (eight) hours as needed for muscle spasms. 30 tablet 0  . nitroGLYCERIN (NITROSTAT) 0.4 MG SL tablet Place 1 tablet (0.4 mg total) under the tongue every 5 (five) minutes x 3 doses as needed for chest pain. 25 tablet 6  . potassium chloride (K-DUR) 10 MEQ tablet Take 1 tablet (10 mEq total) by mouth daily. 30 tablet 6   She no longer takes ASA does continue with plavix.   No results found for this or  any previous visit (from the past 48 hour(s)). No results found.  ROS: General:mild colds a couple of weeks ago, no fevers, no weight changes Skin:no rashes or ulcers HEENT:no blurred vision, no congestion CV:see HPI PUL:see HPI GI:no diarrhea constipation or melena, no indigestion GU:no hematuria, no dysuria MS:no joint pain, no claudication Neuro:no syncope, no lightheadedness Endo:no diabetes, no thyroid disease   Blood pressure 119/62, pulse 75, temperature 97.5  F (36.4 C), temperature source Oral, resp. rate 18, height 5' 1.5" (1.562 m), weight 120 lb 14.4 oz (54.84 kg), SpO2 99 %. PE: General:Pleasant affect, NAD Skin:Warm and dry, brisk capillary refill HEENT:normocephalic, sclera clear, mucus membranes moist Neck:supple, no JVD, rt carotid bruit  Heart:S1S2 RRR without murmur, gallup, rub or click, + chest wall pain to palpation.  Lungs:clear though mildly diminished, without rales, rhonchi, or wheezes ZOX:WRUE, non tender, + BS, do not palpate liver spleen or masses Ext:no lower ext edema, 2+ pedal pulses, 2+ radial pulses Neuro:alert and oriented X 3, MAE, follows commands, + facial symmetry   Assessment/Plan Principal Problem:   Intermediate coronary syndrome vs muscular skeletal pain.  Will check serial troponins, if + troponin would add heparin but will only use VTE lovenox now. Resume ASA.   - add BB  Active Problems:   CAD - CFX DES 01/17/13- last cath 04/2013 patent Coronary arteries.    HTN (hypertension)- controlled   LBBB (left bundle branch block)   Tobacco abuse- discussed importance of stopping.    Hyperlipidemia with target LDL less than 70- continue statin check lipids    INGOLD,LAURA R Nurse Practitioner Certified [facility] Pager [phone] or after 5pm or weekends call [phone] 09/17/2014, 4:51 PM   I have personally seen and examined this patient with Nada Boozer, NP. I agree with the assessment and plan as outlined above.  She has atypical chest pain. Known CAD. Troponin negative. Cycle troponin. Add beta blocker. Treat Musculoskeletal pain. I do not think this represents ACS. She may be mildly volume overloaded. Will give IV lasix tonight.   Amberley Hamler 09/17/2014 5:41 PM

## 2014-09-18 DIAGNOSIS — I200 Unstable angina: Secondary | ICD-10-CM | POA: Diagnosis not present

## 2014-09-18 DIAGNOSIS — I2510 Atherosclerotic heart disease of native coronary artery without angina pectoris: Secondary | ICD-10-CM | POA: Diagnosis not present

## 2014-09-18 DIAGNOSIS — Z720 Tobacco use: Secondary | ICD-10-CM

## 2014-09-18 DIAGNOSIS — I10 Essential (primary) hypertension: Secondary | ICD-10-CM | POA: Diagnosis not present

## 2014-09-18 DIAGNOSIS — I447 Left bundle-branch block, unspecified: Secondary | ICD-10-CM | POA: Diagnosis not present

## 2014-09-18 LAB — CBC
HEMATOCRIT: 39.6 % (ref 36.0–46.0)
HEMOGLOBIN: 12.8 g/dL (ref 12.0–15.0)
MCH: 29.3 pg (ref 26.0–34.0)
MCHC: 32.3 g/dL (ref 30.0–36.0)
MCV: 90.6 fL (ref 78.0–100.0)
Platelets: 173 10*3/uL (ref 150–400)
RBC: 4.37 MIL/uL (ref 3.87–5.11)
RDW: 14.2 % (ref 11.5–15.5)
WBC: 6.4 10*3/uL (ref 4.0–10.5)

## 2014-09-18 LAB — BASIC METABOLIC PANEL
ANION GAP: 7 (ref 5–15)
BUN: 14 mg/dL (ref 6–20)
CALCIUM: 8.7 mg/dL — AB (ref 8.9–10.3)
CO2: 28 mmol/L (ref 22–32)
Chloride: 105 mmol/L (ref 101–111)
Creatinine, Ser: 0.87 mg/dL (ref 0.44–1.00)
Glucose, Bld: 87 mg/dL (ref 65–99)
POTASSIUM: 3.7 mmol/L (ref 3.5–5.1)
SODIUM: 140 mmol/L (ref 135–145)

## 2014-09-18 LAB — LIPID PANEL
CHOLESTEROL: 185 mg/dL (ref 0–200)
HDL: 30 mg/dL — AB (ref >40)
LDL CALC: 131 mg/dL — AB (ref 0–99)
TRIGLYCERIDES: 122 mg/dL (ref <150)
Total CHOL/HDL Ratio: 6.2 RATIO
VLDL: 24 mg/dL (ref 0–40)

## 2014-09-18 LAB — TROPONIN I
Troponin I: <0.03 ng/mL (ref <0.031)
Troponin I: <0.03 ng/mL (ref <0.031)

## 2014-09-18 LAB — HEMOGLOBIN A1C
HEMOGLOBIN A1C: 5.2 % (ref 4.8–5.6)
MEAN PLASMA GLUCOSE: 103 mg/dL

## 2014-09-18 NOTE — Care Management (Signed)
This Case Manager received received phone call from Tomi Bamberger, RN CM who indicated patient needing hospital follow-up appointment. Patient recently lost job and does not have a PCP. Has [employer] insurance at this time so not appropriate for [facility]. Hospital follow-up appointment obtained on 09/24/14 at 1030 [facility] to [facility]. Patient able to get medications from [facility] pharmacy if needed.  AVS updated. Tomi Bamberger, RN CM also updated.
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
## 2014-09-18 NOTE — Progress Notes (Addendum)
Patient Name: Lindsay Camacho Date of Encounter: 09/18/2014  Primary Cardiologist: Dr. Eldridge Dace   Principal Problem:   Intermediate coronary syndrome Active Problems:   HTN (hypertension)   LBBB (left bundle branch block)   Tobacco abuse   CAD - CFX DES 01/17/13   Chest pain   Hyperlipidemia with target LDL less than 70    SUBJECTIVE  Continue to have CP persistent for past 30 hours. Also has some SOB.  CURRENT MEDS . aspirin EC  81 mg Oral Daily  . atorvastatin  40 mg Oral q1800  . clopidogrel  75 mg Oral Q breakfast  . enoxaparin (LOVENOX) injection  40 mg Subcutaneous Q24H  . furosemide  20 mg Oral Daily  . ketorolac  30 mg Intravenous 4 times per day  . metoprolol tartrate  12.5 mg Oral BID  . nicotine  14 mg Transdermal Daily  . nitroGLYCERIN  1 inch Topical 3 times per day  . potassium chloride  10 mEq Oral Daily  . sodium chloride  3 mL Intravenous Q12H    OBJECTIVE  Filed Vitals:   09/17/14 1548 09/17/14 2119 09/18/14 0537 09/18/14 0558  BP: 119/62 104/51 122/62   Pulse: 75 64 56   Temp: 97.5 F (36.4 C) 98.7 F (37.1 C) 97.4 F (36.3 C)   TempSrc: Oral Oral Oral   Resp: Height: 5' 1.5" (1.562 m)     Weight: 120 lb 14.4 oz (54.84 kg)   118 lb 9.6 oz (53.797 kg)  SpO2: 99% 97% 100%     Intake/Output Summary (Last 24 hours) at 09/18/14 1040 Last data filed at 09/18/14 0800  Gross per 24 hour  Intake    120 ml  Output      0 ml  Net    120 ml   Filed Weights   09/17/14 1548 09/18/14 0558  Weight: 120 lb 14.4 oz (54.84 kg) 118 lb 9.6 oz (53.797 kg)    PHYSICAL EXAM  General: Pleasant, NAD. Neuro: Alert and oriented X 3. Moves all extremities spontaneously. Psych: Normal affect. HEENT:  Normal  Neck: Supple without bruits or JVD. Lungs:  Resp regular and unlabored, CTA. Heart: RRR no s3, s4, or murmurs. Abdomen: Soft, non-tender, non-distended, BS + x 4.  Extremities: No clubbing, cyanosis or edema. DP/PT/Radials 2+ and equal  bilaterally.  Accessory Clinical Findings  CBC  Recent Labs  09/17/14 1755 09/18/14 0500  WBC 7.4 6.4  HGB 14.3 12.8  HCT 43.8 39.6  MCV 91.3 90.6  PLT 206 173   Basic Metabolic Panel  Recent Labs  09/17/14 1755 09/18/14 0500  NA  --  140  K  --  3.7  CL  --  105  CO2  --  28  GLUCOSE  --  87  BUN  --  14  CREATININE 1.00 0.87  CALCIUM  --  8.7*   Cardiac Enzymes  Recent Labs  09/17/14 1755 09/17/14 2308 09/18/14 0500  TROPONINI <0.03 <0.03 <0.03   Hemoglobin A1C  Recent Labs  09/17/14 1755  HGBA1C 5.2   Fasting Lipid Panel  Recent Labs  09/18/14 0500  CHOL 185  HDL 30*  LDLCALC 131*  TRIG 122  CHOLHDL 6.2   Thyroid Function Tests  Recent Labs  09/17/14 1755  TSH 4.039    TELE NSR with HR 60s    ECG  NSR with LBBB, TWI in inferolateral leads.   Echocardiogram 03/06/2014  LV EF: 45% -  50%  ------------------------------------------------------------------- Indications:   Mitral regurgitation 424.0.  ------------------------------------------------------------------- History:  PMH: Ischemic Cardiomyopathy. Left Bundle Branch Block. Acute Coronary Syndrome. Congestive heart failure. PMH: Myocardial infarction. Risk factors: Hypertension. Dyslipidemia.  ------------------------------------------------------------------- Study Conclusions  - Left ventricle: There is basal and mid inferior and inferoseptal hypokinesis. Systolic function was mildly reduced. The estimated ejection fraction was in the range of 45% to 50%. Wall motion was normal; there were no regional wall motion abnormalities. - Aortic valve: Structurally normal valve. Trileaflet; normal thickness leaflets. There was mild regurgitation. - Aorta: Mild non-mobile plaque is present in the descending thoracic aorta. - Mitral valve: Mildly thickened leaflets . There was moderate regurgitation directed centrally. - Left atrium: The atrium was  dilated. No evidence of thrombus in the atrial cavity or appendage. No evidence of thrombus in the atrial cavity or appendage. - Right ventricle: Systolic function was normal. - Right atrium: No evidence of thrombus in the atrial cavity or appendage. - Atrial septum: No defect or patent foramen ovale was identified. - Tricuspid valve: There was mild regurgitation.  Impressions:  - LVEF mildly decreased estimated at 45%. Mitral valve is mildly thickened, there are multiple small centrally directed jets of mitral regurgitation associated with moderate regurgitation. No structural problem such as papillary musce rupture was seen.    Radiology/Studies  No results found.  ASSESSMENT AND PLAN  49 yo female with h/o ICM 40-45% by TEE and moderate MR, CAD s/p DES to LCx 01/2013, last cath 04/2013 widely patent stent transferred from [facility] with CP.   1. Atypical chest pain, ?musculoskeletal  - persistent CP since 5AM yesterday, now close to 30 hours with negative trop. Does have more inferior lead TWI, but CP very atypical going from L axilla down to L breast  - lasix and BB added, however she does not appears to be fluid overloaded on exam  - continue to have SOB and CP this morning. HR 60s, no ipsilateral swelling, does not have high suspicion for PE. Continue toradol  2. CAD s/p DES to LCx 01/2013  - last cath 04/2013 widely patent stent 3. HTN 4. HLD 5. LBBB 6. H/o TIA  Signed, Amedeo Plenty Pager: [phone]  The patient was seen, examined and discussed with Azalee Course, PA-C and I agree with the above.   49 yo female with h/o ICM 40-45% by TEE and moderate MR, CAD s/p DES to LCx 01/2013, last cath 04/2013 widely patent stent transferred from [facility] with CP.  The pain is atypical, troponin negative x 3, ECG shows SR, LBBB , new negat T waves in the inferior leads. However she also complains of SOB, we will schedule a stress test for tomorrow.  BP  controlled.   Lars Masson 09/18/2014

## 2014-09-18 NOTE — Care Management Note (Addendum)
Case Management Note  Patient Details  Name: Lindsay Camacho MRN: [id number] Date of Birth: [birth year]  Subjective/Objective:  Pt admitted for chest pain- Transfer from [facility]. Pt without PCP.             Action/Plan: Per pt she has been out of work for a while. Per pt her insurance is still active. CM will not be able to assist with medications since insurance is active. CM did however call the [facility] liaison to see if pt is eligible for Clinic Services. CM awaiting return phone call.    Expected Discharge Date:                  Expected Discharge Plan:  Home/Self Care  In-House Referral:  Clinical Social Work  Discharge planning Services  CM Consult, [facility], Follow-up appt scheduled  Post Acute Care Choice:  NA Choice offered to:  NA  DME Arranged:  N/A DME Agency:  NA  HH Arranged:  NA HH Agency:  NA  Status of Service:     Medicare Important Message Given:    Date Medicare IM Given:    Medicare IM give by:    Date Additional Medicare IM Given:    Additional Medicare Important Message give by:     If discussed at Long Length of Stay Meetings, dates discussed:    Additional Comments: 1518 09-21-14 Tomi Bamberger, RN,BSN [phone] Plan home once stable. No further needs from CM at this time.    1316 09-18-14 Tomi Bamberger, RN,BSN [phone] CM did speak with [facility] Liaison Peterson [place] and she was able to schedule appointment [facility] for [facility] follow up. Pt will be able to utilize the [facility] for medication assistance. No further needs from CM at this time.   Gala Lewandowsky, RN 09/18/2014, 10:35 AM

## 2014-09-19 ENCOUNTER — Observation Stay (HOSPITAL_COMMUNITY): Payer: BLUE CROSS/BLUE SHIELD

## 2014-09-19 DIAGNOSIS — R404 Transient alteration of awareness: Secondary | ICD-10-CM | POA: Diagnosis not present

## 2014-09-19 DIAGNOSIS — I200 Unstable angina: Secondary | ICD-10-CM | POA: Diagnosis not present

## 2014-09-19 DIAGNOSIS — I10 Essential (primary) hypertension: Secondary | ICD-10-CM | POA: Diagnosis not present

## 2014-09-19 DIAGNOSIS — I447 Left bundle-branch block, unspecified: Secondary | ICD-10-CM | POA: Diagnosis not present

## 2014-09-19 DIAGNOSIS — I2510 Atherosclerotic heart disease of native coronary artery without angina pectoris: Secondary | ICD-10-CM | POA: Diagnosis not present

## 2014-09-19 LAB — BASIC METABOLIC PANEL
Anion gap: 9 (ref 5–15)
BUN: 14 mg/dL (ref 6–20)
CHLORIDE: 105 mmol/L (ref 101–111)
CO2: 24 mmol/L (ref 22–32)
Calcium: 9 mg/dL (ref 8.9–10.3)
Creatinine, Ser: 0.74 mg/dL (ref 0.44–1.00)
GFR calc non Af Amer: >60 mL/min (ref >60)
Glucose, Bld: 85 mg/dL (ref 65–99)
POTASSIUM: 3.5 mmol/L (ref 3.5–5.1)
SODIUM: 138 mmol/L (ref 135–145)

## 2014-09-19 LAB — PHOSPHORUS: PHOSPHORUS: 3.4 mg/dL (ref 2.5–4.6)

## 2014-09-19 LAB — MAGNESIUM: Magnesium: 1.7 mg/dL (ref 1.7–2.4)

## 2014-09-19 MED ORDER — ALPRAZOLAM 0.25 MG PO TABS
ORAL_TABLET | ORAL | Status: AC
  Filled 2014-09-19: qty 1

## 2014-09-19 MED ORDER — REGADENOSON 0.4 MG/5ML IV SOLN
0.4000 mg | Freq: Once | INTRAVENOUS | Status: AC
  Administered 2014-09-19: 0.4 mg via INTRAVENOUS

## 2014-09-19 MED ORDER — TECHNETIUM TC 99M SESTAMIBI GENERIC - CARDIOLITE
10.0000 | Freq: Once | INTRAVENOUS | Status: AC | PRN
  Administered 2014-09-19: 10 via INTRAVENOUS

## 2014-09-19 MED ORDER — SODIUM CHLORIDE 0.9 % IJ SOLN
80.0000 mg | INTRAVENOUS | Status: AC
  Administered 2014-09-19: 80 mg via INTRAVENOUS

## 2014-09-19 MED ORDER — REGADENOSON 0.4 MG/5ML IV SOLN
INTRAVENOUS | Status: AC
  Filled 2014-09-19: qty 5

## 2014-09-19 MED ORDER — TECHNETIUM TC 99M SESTAMIBI - CARDIOLITE
30.0000 | Freq: Once | INTRAVENOUS | Status: AC | PRN
  Administered 2014-09-19: 11:00:00 30 via INTRAVENOUS

## 2014-09-19 NOTE — Sedation Documentation (Signed)
Pt state that she cannot. Move extremities, she does make eye contact. Pt has left side facial drooping. Per Pa Wende Mott, send pt to CT, call code stroke, initiated. Called CT per tech bring to CT scanner 3. PA, nurse at bedside

## 2014-09-19 NOTE — Progress Notes (Signed)
HR dropped into the 40s and as low as 39 once while sleeping. Currently SR in the 50s. Notified [place] with Cardiology.

## 2014-09-19 NOTE — Progress Notes (Signed)
Patient Name: Lindsay Camacho Date of Encounter: 09/19/2014  PROBLEM LIST  Principal Problem:   Intermediate coronary syndrome Active Problems:   HTN (hypertension)   LBBB (left bundle branch block)   Tobacco abuse   CAD - CFX DES 01/17/13   Chest pain   Hyperlipidemia with target LDL less than 70     SUBJECTIVE  Still complaining of chest pain.   CURRENT MEDS . aspirin EC  81 mg Oral Daily  . atorvastatin  40 mg Oral q1800  . clopidogrel  75 mg Oral Q breakfast  . enoxaparin (LOVENOX) injection  40 mg Subcutaneous Q24H  . furosemide  20 mg Oral Daily  . ketorolac  30 mg Intravenous 4 times per day  . metoprolol tartrate  12.5 mg Oral BID  . nicotine  14 mg Transdermal Daily  . nitroGLYCERIN  1 inch Topical 3 times per day  . potassium chloride  10 mEq Oral Daily  . regadenoson      . sodium chloride  3 mL Intravenous Q12H    OBJECTIVE  Filed Vitals:   09/18/14 2330 09/19/14 0500 09/19/14 0532 09/19/14 0958  BP: 102/60 153/80 118/66 235/67  Pulse:  55  60  Temp:  97.5 F (36.4 C)    TempSrc:      Resp:  17    Height:      Weight:  118 lb 3.2 oz (53.615 kg)    SpO2:  99%      Intake/Output Summary (Last 24 hours) at 09/19/14 1054 Last data filed at 09/19/14 0500  Gross per 24 hour  Intake    920 ml  Output      0 ml  Net    920 ml   Filed Weights   09/17/14 1548 09/18/14 0558 09/19/14 0500  Weight: 120 lb 14.4 oz (54.84 kg) 118 lb 9.6 oz (53.797 kg) 118 lb 3.2 oz (53.615 kg)    PHYSICAL EXAM  GEN: Well nourished, well developed, in no acute distress. HEENT: normal. Neck: Supple, no JVD  Cardiac: RRR, no murmurs, rubs, or gallops. No edema.     Respiratory:  Respirations regular and unlabored, clear to auscultation bilaterally. GI: Soft, nontender, nondistended MS: no deformity or atrophy. Skin: warm and dry, no rash. Neuro:  Strength and sensation are intact. Psych: Normal affect.  Accessory Clinical Findings  CBC  Recent Labs   09/17/14 1755 09/18/14 0500  WBC 7.4 6.4  HGB 14.3 12.8  HCT 43.8 39.6  MCV 91.3 90.6  PLT 206 173   Basic Metabolic Panel  Recent Labs  09/17/14 1755 09/18/14 0500  NA  --  140  K  --  3.7  CL  --  105  CO2  --  28  GLUCOSE  --  87  BUN  --  14  CREATININE 1.00 0.87  CALCIUM  --  8.7*   Liver Function Tests No results for input(s): AST, ALT, ALKPHOS, BILITOT, PROT, ALBUMIN in the last 72 hours. No results for input(s): LIPASE, AMYLASE in the last 72 hours. Cardiac Enzymes  Recent Labs  09/17/14 1755 09/17/14 2308 09/18/14 0500  TROPONINI <0.03 <0.03 <0.03   BNP (last 3 results)  Recent Labs  01/08/14 1409  BNP 1118.4*   D-Dimer No results for input(s): DDIMER in the last 72 hours. Hemoglobin A1C  Recent Labs  09/17/14 1755  HGBA1C 5.2   Fasting Lipid Panel  Recent Labs  09/18/14 0500  CHOL 185  HDL 30*  LDLCALC  131*  TRIG 122  CHOLHDL 6.2   Thyroid Function Tests  Recent Labs  09/17/14 1755  TSH 4.039    TELE  NSR  ECG  LBBB  RADIOLOGY/STUDIES  No results found.  PATIENT SUMMARY  49 yo female with h/o ICM 40-45% by TEE and moderate MR, CAD s/p DES to LCx 01/2013, last cath 04/2013 widely patent stent transferred from [facility] with CP.   ASSESSMENT AND PLAN  1. Atypical chest pain, ?musculoskeletal - persistent CP for 48+  hours with negative trop. Does have more inferior lead TWI, but CP very atypical going from L axilla down to L breast - lasix and BB added, however she does not appears to be fluid overloaded on exam - continue to have SOB and CP this morning. HR 50-60s, does not have high suspicion for PE. Continue toradol  2. CAD s/p DES to LCx 01/2013 - last cath 04/2013 widely patent stent  - Myoview done today.  She had severe stress reaction to the stress agent.  She became tearful, rigid in all 4 extremities and reported tingling all over.  BP improved back to  1110/80 with reapplication of NTP but had increased as high as 260/90 prior to this.  We gave her Xanax 0.25 mg and Aminophylline 80 mg IVP.  Symptoms with minimal improvement but patient continued to note inability to move.  See below. Images pending.  3. HTN - BP markedly elevated in Nuc Med.  BPs have been ok until this AM.  But, NTP removed for nuc study.  Will reapply after study.  May need Isosorbide or Amlodipine at DC to control BP.    4. HLD  5. LBBB  6. H/o TIA - She had an unusual reaction during her stress test.  All 4 extremities became rigid and she told us she could not move.  She was tearful and complained of tingling.  As noted above, she was given Xanax 0.25 mg and Aminophylline 80 mg IVP.   No facial droop noted.  Will get a Head CT without contrast as her BP was markedly elevated during the test.  Will call a code stroke now.    Signed, Tereso Newcomer, PA-C  09/19/2014, 10:54 AM     Personally seen and examined. Agree with above. Unusual response to stress test. Appreciate neurologic evaluation. Head CT unremarkable. Awaiting results of test but given this response, we will continue to observe today.  Donato Schultz, MD

## 2014-09-19 NOTE — Sedation Documentation (Signed)
After procedure pt states that she cannot move, pt is tearful. Per PA S Weaver give pt 0.25mg  xanax PO. After administration pt states that she feel much better. Vitals stable. PA at bedside.

## 2014-09-19 NOTE — Significant Event (Signed)
Rapid Response Event Note  Overview: Time Called: 1093 Arrival Time: 1129 Event Type: Neurologic  Initial Focused Assessment:  Called Stat to CT scan rm 3 to evaluate patient.  Patient was in the middle of stress test when she became tearful and stated she could not move her extremities.  I met patient in CT scan, she was on table, patient looks very scared, noted to have facial droop and states she can not move her body.  Patient was orginially very hypertensive.   Interventions:  Patient is alert and oriented, extremities are very rigid no drift noted.  Has facial droop, NIHSS 2.  Dr. Nicole Kindred called to bedside.  As per Dr. Nicole Kindred BMP, mg and ph to be drawn and OK with continuing test and then return to room.    Event Summary:  RN to call if assistance needed   at      at          [facility], Harlin Rain

## 2014-09-19 NOTE — Progress Notes (Signed)
    Currently down a nuclear stress lab getting tested.  49 year old with ejection fraction 40-45%, prior lateral wall MI, prior cardiac catheterization in April 2015, widely patent stent. Prior procedure complicated by retroperitoneal bleed  Left bundle branch block chronic. Troponins have been normal. Smoking cessation.  Await results of stress test.  Donato Schultz, MD

## 2014-09-20 DIAGNOSIS — I200 Unstable angina: Secondary | ICD-10-CM | POA: Diagnosis not present

## 2014-09-20 DIAGNOSIS — Z955 Presence of coronary angioplasty implant and graft: Secondary | ICD-10-CM | POA: Diagnosis not present

## 2014-09-20 DIAGNOSIS — F17210 Nicotine dependence, cigarettes, uncomplicated: Secondary | ICD-10-CM | POA: Diagnosis present

## 2014-09-20 DIAGNOSIS — I252 Old myocardial infarction: Secondary | ICD-10-CM | POA: Diagnosis not present

## 2014-09-20 DIAGNOSIS — Z8673 Personal history of transient ischemic attack (TIA), and cerebral infarction without residual deficits: Secondary | ICD-10-CM | POA: Diagnosis not present

## 2014-09-20 DIAGNOSIS — I25118 Atherosclerotic heart disease of native coronary artery with other forms of angina pectoris: Secondary | ICD-10-CM | POA: Diagnosis not present

## 2014-09-20 DIAGNOSIS — I10 Essential (primary) hypertension: Secondary | ICD-10-CM | POA: Diagnosis present

## 2014-09-20 DIAGNOSIS — I5022 Chronic systolic (congestive) heart failure: Secondary | ICD-10-CM | POA: Diagnosis present

## 2014-09-20 DIAGNOSIS — I340 Nonrheumatic mitral (valve) insufficiency: Secondary | ICD-10-CM | POA: Diagnosis present

## 2014-09-20 DIAGNOSIS — R079 Chest pain, unspecified: Secondary | ICD-10-CM | POA: Diagnosis present

## 2014-09-20 DIAGNOSIS — I2510 Atherosclerotic heart disease of native coronary artery without angina pectoris: Secondary | ICD-10-CM | POA: Diagnosis present

## 2014-09-20 DIAGNOSIS — E785 Hyperlipidemia, unspecified: Secondary | ICD-10-CM | POA: Diagnosis not present

## 2014-09-20 DIAGNOSIS — I447 Left bundle-branch block, unspecified: Secondary | ICD-10-CM | POA: Diagnosis not present

## 2014-09-20 MED ORDER — SODIUM CHLORIDE 0.9 % IJ SOLN
3.0000 mL | Freq: Two times a day (BID) | INTRAMUSCULAR | Status: DC
  Administered 2014-09-20 – 2014-09-21 (×2): 3 mL via INTRAVENOUS

## 2014-09-20 MED ORDER — SODIUM CHLORIDE 0.9 % WEIGHT BASED INFUSION
3.0000 mL/kg/h | INTRAVENOUS | Status: DC
  Administered 2014-09-21: 3 mL/kg/h via INTRAVENOUS

## 2014-09-20 MED ORDER — SODIUM CHLORIDE 0.9 % IV SOLN: 250.0000 mL | INTRAVENOUS | Status: DC | PRN

## 2014-09-20 MED ORDER — SODIUM CHLORIDE 0.9 % WEIGHT BASED INFUSION
1.0000 mL/kg/h | INTRAVENOUS | Status: DC
  Administered 2014-09-21: 1 mL/kg/h via INTRAVENOUS

## 2014-09-20 MED ORDER — POLYETHYLENE GLYCOL 3350 17 G PO PACK
17.0000 g | PACK | Freq: Every day | ORAL | Status: DC
  Administered 2014-09-20: 17 g via ORAL
  Filled 2014-09-20 (×2): qty 1

## 2014-09-20 MED ORDER — ASPIRIN 81 MG PO CHEW
81.0000 mg | CHEWABLE_TABLET | ORAL | Status: AC
  Administered 2014-09-21: 81 mg via ORAL
  Filled 2014-09-20: qty 1

## 2014-09-20 MED ORDER — SODIUM CHLORIDE 0.9 % IJ SOLN: 3.0000 mL | INTRAMUSCULAR | Status: DC | PRN

## 2014-09-20 NOTE — Progress Notes (Signed)
Subjective:  Had a bizarre episode following stress test yesterday with full body rigidity. Neurology assessed patient. CT scan unremarkable. Question anxiety. Mild constipation. Having issues with social services, difficulty getting food stamps.  Objective:  Vital Signs in the last 24 hours: Temp:  [97.6 F (36.4 C)-97.9 F (36.6 C)] 97.8 F (36.6 C) (09/18 0500) Pulse Rate:  [56-93] 71 (09/18 0500) Resp:  [14-16] 16 (09/18 0500) BP: (111-264)/(60-102) 115/66 mmHg (09/18 0500) SpO2:  [96 %-100 %] 97 % (09/18 0500) Weight:  [117 lb 6.4 oz (53.252 kg)] 117 lb 6.4 oz (53.252 kg) (09/18 0500)  Intake/Output from previous day:     Physical Exam: General: Thin, looks older than stated age, in no acute distress. Head:  Normocephalic and atraumatic. Lungs: Clear to auscultation and percussion. Heart: Normal S1 and S2.  No murmur, rubs or gallops.  Abdomen: soft, non-tender, positive bowel sounds. Extremities: No clubbing or cyanosis. No edema. Radial pulse palpated Neurologic: Alert and oriented x 3.    Lab Results:  Recent Labs  09/17/14 1755 09/18/14 0500  WBC 7.4 6.4  HGB 14.3 12.8  PLT 206 173    Recent Labs  09/18/14 0500 09/19/14 1300  NA 140 138  K 3.7 3.5  CL 105 105  CO2 28 24  GLUCOSE 87 85  BUN 14 14  CREATININE 0.87 0.74    Recent Labs  09/17/14 2308 09/18/14 0500  TROPONINI <0.03 <0.03   Hepatic Function Panel No results for input(s): PROT, ALBUMIN, AST, ALT, ALKPHOS, BILITOT, BILIDIR, IBILI in the last 72 hours.  Recent Labs  09/18/14 0500  CHOL 185   No results for input(s): PROTIME in the last 72 hours.  Imaging: Ct Head Wo Contrast  09/19/2014   CLINICAL DATA:  Left facial droop, generalized weakness  EXAM: CT HEAD WITHOUT CONTRAST  TECHNIQUE: Contiguous axial images were obtained from the base of the skull through the vertex without contrast.  COMPARISON:  None  FINDINGS: Normal appearance of the intracranial structures. No  evidence for acute hemorrhage, mass lesion, midline shift, hydrocephalus or large infarct. No acute bony abnormality. The visualized sinuses are clear. Stable small right corona radiata white matter hypodensity compatible with remote ischemia as before. No interval change.  IMPRESSION: No acute intracranial abnormality.  These results were called by telephone at the time of interpretation on 09/19/2014 at 11:45 am to Dr. Roseanne Reno, who verbally acknowledged these results.   Electronically Signed   By: Judie Petit.  Shick M.D.   On: 09/19/2014 11:46   Nm Myocar Multi W/spect W/wall Motion / Ef  09/19/2014   CLINICAL DATA:  Chest pain.  EXAM: MYOCARDIAL IMAGING WITH SPECT (REST AND PHARMACOLOGIC-STRESS)  GATED LEFT VENTRICULAR WALL MOTION STUDY  LEFT VENTRICULAR EJECTION FRACTION  TECHNIQUE: Standard myocardial SPECT imaging was performed after resting intravenous injection of 10 mCi Tc-33m sestamibi. Subsequently, intravenous infusion of Lexiscan was performed under the supervision of the Cardiology staff. At peak effect of the drug, 30 mCi Tc-61m sestamibi was injected intravenously and standard myocardial SPECT imaging was performed. Quantitative gated imaging was also performed to evaluate left ventricular wall motion, and estimate left ventricular ejection fraction.  COMPARISON:  None.  FINDINGS: Perfusion: Decreased activity in inferolateral wall on the stress images suggesting ischemia. Breast attenuation of the low anterior wall is also noted.  Wall Motion: Left ventricular wall motion abnormality in the area of ischemia. Mild global hypokinesis. Ventricular dilatation.  Left Ventricular Ejection Fraction: 39 %  End diastolic volume 143 ml  End systolic volume 87 ml  IMPRESSION: 1. Moderate area of inferolateral ischemia.  2. Global hypokinesis and wall motion abnormality in the inferolateral region.  3. Left ventricular ejection fraction 39%  4. High-risk stress test findings*.  *2012 Appropriate Use Criteria for  Coronary Revascularization Focused Update: J Am Coll Cardiol. 2012;59(9):857-881. http://content.dementiazones.com.aspx?articleid=1201161   Electronically Signed   By: Rudie Meyer M.D.   On: 09/19/2014 14:14   Personally viewed.  Troponins normal. Creatinine 0.7.  Telemetry: no adverse rhtyhms Personally viewed.   EKG:  LBBB  Cardiac Studies:  NUC high risk, EF 39%, lateral ischemia  Assessment/Plan:  Principal Problem:   Intermediate coronary syndrome Active Problems:   HTN (hypertension)   LBBB (left bundle branch block)   Tobacco abuse   CAD - CFX DES 01/17/13   Chest pain   Hyperlipidemia with target LDL less than 67  49 year old female with coronary disease followed by Dr. Eldridge Dace with recent DES placed to left circumflex in January 2015 with history of lateral wall MI, reduced ejection fraction of 35-40%, prior retroperitoneal bleed following catheterization requiring surgery and repair of external iliac artery admitted with chest pain. Troponins normal. Nuclear stress test reported as high risk, abnormal lateral wall ischemia, EF 39%.   1. Abnormal stress test-high risk, lateral ischemia, EF 39%.  - Cardiac catheterization Monday.  - Nothing by mouth past midnight. Risks and benefits of quitting stroke, heart attack, death, renal impairment discussed.  - Note, prior retroperitoneal bleed following last cardiac catheterization.  - Personally viewed images of nuclear stress test, certainly there is lateral wall ischemia present.  - Possible restenosis in circumflex artery.  - She was told that if she needed another heart catheterization to use the radial artery approach.  2. Coronary artery disease  - Circumflex DES, 01/2013.  - Nuclear stress test demonstrated ischemia in the lateral wall.  - Also has history of lateral wall MI.  3. Moderate mitral regurgitation  4. Chronic systolic heart failure  - EF 35-40%.  5. Left bundle branch block  6. Smoking-tobacco  cessation  SKAINS, MARK 09/20/2014, 9:30 AM

## 2014-09-21 ENCOUNTER — Telehealth: Payer: Self-pay | Admitting: Interventional Cardiology

## 2014-09-21 ENCOUNTER — Encounter (HOSPITAL_COMMUNITY)
Admission: AD | Disposition: A | Discharge status: Home / Self Care | Payer: Self-pay | Source: Other Acute Inpatient Hospital | Attending: Cardiovascular Disease

## 2014-09-21 ENCOUNTER — Ambulatory Visit: Payer: BLUE CROSS/BLUE SHIELD | Admitting: Physician Assistant

## 2014-09-21 DIAGNOSIS — I25110 Atherosclerotic heart disease of native coronary artery with unstable angina pectoris: Secondary | ICD-10-CM

## 2014-09-21 HISTORY — PX: CARDIAC CATHETERIZATION: SHX172

## 2014-09-21 SURGERY — LEFT HEART CATH AND CORONARY ANGIOGRAPHY
Anesthesia: LOCAL

## 2014-09-21 MED ORDER — FENTANYL CITRATE (PF) 100 MCG/2ML IJ SOLN
INTRAMUSCULAR | Status: DC | PRN
  Administered 2014-09-21 (×2): 25 ug via INTRAVENOUS

## 2014-09-21 MED ORDER — HEPARIN (PORCINE) IN NACL 2-0.9 UNIT/ML-% IJ SOLN
INTRAMUSCULAR | Status: AC
  Filled 2014-09-21: qty 1500

## 2014-09-21 MED ORDER — VERAPAMIL HCL 2.5 MG/ML IV SOLN
INTRAVENOUS | Status: AC
  Filled 2014-09-21: qty 2

## 2014-09-21 MED ORDER — MIDAZOLAM HCL 2 MG/2ML IJ SOLN
INTRAMUSCULAR | Status: AC
  Filled 2014-09-21: qty 4

## 2014-09-21 MED ORDER — SODIUM CHLORIDE 0.9 % IJ SOLN: 3.0000 mL | INTRAMUSCULAR | Status: DC | PRN

## 2014-09-21 MED ORDER — SODIUM CHLORIDE 0.9 % IV SOLN: 250.0000 mL | INTRAVENOUS | Status: DC | PRN

## 2014-09-21 MED ORDER — SODIUM CHLORIDE 0.9 % IJ SOLN
3.0000 mL | Freq: Two times a day (BID) | INTRAMUSCULAR | Status: DC
  Administered 2014-09-22 (×2): 3 mL via INTRAVENOUS

## 2014-09-21 MED ORDER — SODIUM CHLORIDE 0.9 % WEIGHT BASED INFUSION
3.0000 mL/kg/h | INTRAVENOUS | Status: AC
  Administered 2014-09-21: 3 mL/kg/h via INTRAVENOUS

## 2014-09-21 MED ORDER — IOHEXOL 350 MG/ML SOLN
INTRAVENOUS | Status: DC | PRN
  Administered 2014-09-21: 60 mL via INTRAVENOUS

## 2014-09-21 MED ORDER — LIDOCAINE HCL (PF) 1 % IJ SOLN
INTRAMUSCULAR | Status: AC
  Filled 2014-09-21: qty 30

## 2014-09-21 MED ORDER — LIDOCAINE HCL (PF) 1 % IJ SOLN
INTRAMUSCULAR | Status: DC | PRN
  Administered 2014-09-21: 16:00:00

## 2014-09-21 MED ORDER — ACETAMINOPHEN 325 MG PO TABS: 650.0000 mg | ORAL_TABLET | ORAL | Status: DC | PRN

## 2014-09-21 MED ORDER — MIDAZOLAM HCL 2 MG/2ML IJ SOLN
INTRAMUSCULAR | Status: DC | PRN
  Administered 2014-09-21: 1 mg via INTRAVENOUS
  Administered 2014-09-21: 2 mg via INTRAVENOUS

## 2014-09-21 MED ORDER — ALPRAZOLAM 0.25 MG PO TABS
ORAL_TABLET | ORAL | Status: AC
  Filled 2014-09-21: qty 1

## 2014-09-21 MED ORDER — NITROGLYCERIN 1 MG/10 ML FOR IR/CATH LAB
INTRA_ARTERIAL | Status: AC
  Filled 2014-09-21: qty 10

## 2014-09-21 MED ORDER — FENTANYL CITRATE (PF) 100 MCG/2ML IJ SOLN
INTRAMUSCULAR | Status: AC
  Filled 2014-09-21: qty 4

## 2014-09-21 MED ORDER — ONDANSETRON HCL 4 MG/2ML IJ SOLN: 4.0000 mg | Freq: Four times a day (QID) | INTRAMUSCULAR | Status: DC | PRN

## 2014-09-21 SURGICAL SUPPLY — 16 items
BAG SNAP BAND KOVER 36X36 (MISCELLANEOUS) ×2 IMPLANT
CATH INFINITI 5FR ANG PIGTAIL (CATHETERS) ×1 IMPLANT
CATH INFINITI 5FR MULTPACK ANG (CATHETERS) ×2 IMPLANT
CATH OPTITORQUE TIG 4.0 5F (CATHETERS) ×3 IMPLANT
DEVICE RAD COMP TR BAND LRG (VASCULAR PRODUCTS) ×1 IMPLANT
GLIDESHEATH SLEND A-KIT 6F 22G (SHEATH) ×3 IMPLANT
GLIDESHEATH SLEND SS 6F .021 (SHEATH) ×2 IMPLANT
KIT HEART LEFT (KITS) ×3 IMPLANT
PACK CARDIAC CATHETERIZATION (CUSTOM PROCEDURE TRAY) ×3 IMPLANT
SHEATH PINNACLE 6F 10CM (SHEATH) ×2 IMPLANT
SYR MEDRAD MARK V 150ML (SYRINGE) ×3 IMPLANT
TRANSDUCER W/STOPCOCK (MISCELLANEOUS) ×3 IMPLANT
TUBING CIL FLEX 10 FLL-RA (TUBING) ×1 IMPLANT
WIRE EMERALD 3MM-J .035X150CM (WIRE) ×2 IMPLANT
WIRE HI TORQ VERSACORE-J 145CM (WIRE) ×2 IMPLANT
WIRE SAFE-T 1.5MM-J .035X260CM (WIRE) ×3 IMPLANT

## 2014-09-21 NOTE — Telephone Encounter (Signed)
New message       Pt is in the hosp.  She needs a note stating she is not able to work until further notice.  Someone will come over and pick it up.  Please call pt when note is ready

## 2014-09-21 NOTE — Progress Notes (Signed)
 Patient Name: Lindsay Camacho Date of Encounter: 09/21/2014  Primary Cardiologist: Dr. Varanasi   Principal Problem:   Intermediate coronary syndrome Active Problems:   HTN (hypertension)   LBBB (left bundle branch block)   Tobacco abuse   CAD - CFX DES 01/17/13   Chest pain   Hyperlipidemia with target LDL less than 70    SUBJECTIVE  Denies any SOB, CP never left since arrival. Want letter for social service  CURRENT MEDS . aspirin EC  81 mg Oral Daily  . atorvastatin  40 mg Oral q1800  . clopidogrel  75 mg Oral Q breakfast  . enoxaparin (LOVENOX) injection  40 mg Subcutaneous Q24H  . furosemide  20 mg Oral Daily  . metoprolol tartrate  12.5 mg Oral BID  . nicotine  14 mg Transdermal Daily  . nitroGLYCERIN  1 inch Topical 3 times per day  . polyethylene glycol  17 g Oral Daily  . potassium chloride  10 mEq Oral Daily  . sodium chloride  3 mL Intravenous Q12H  . sodium chloride  3 mL Intravenous Q12H    OBJECTIVE  Filed Vitals:   09/20/14 1959 09/21/14 0457 09/21/14 0802 09/21/14 0805  BP: 117/64 90/62 100/66 100/66  Pulse: 58 63 50 50  Temp: 97.2 F (36.2 C) 98 F (36.7 C) 97.6 F (36.4 C) 97.6 F (36.4 C)  TempSrc: Oral Oral Oral Oral  Resp: 16     Height:      Weight:      SpO2: 98% 99%  99%    Intake/Output Summary (Last 24 hours) at 09/21/14 0819 Last data filed at 09/21/14 0800  Gross per 24 hour  Intake      0 ml  Output      0 ml  Net      0 ml   Filed Weights   09/18/14 0558 09/19/14 0500 09/20/14 0500  Weight: 118 lb 9.6 oz (53.797 kg) 118 lb 3.2 oz (53.615 kg) 117 lb 6.4 oz (53.252 kg)    PHYSICAL EXAM  General: Pleasant, NAD. Neuro: Alert and oriented X 3. Moves all extremities spontaneously. Psych: Normal affect. HEENT:  Normal  Neck: Supple without bruits or JVD. Lungs:  Resp regular and unlabored, CTA. Heart: RRR no s3, s4, or murmurs. Abdomen: Soft, non-tender, non-distended, BS + x 4.  Extremities: No clubbing, cyanosis or  edema. DP/PT/Radials 2+ and equal bilaterally.  Accessory Clinical Findings  CBC No results for input(s): WBC, NEUTROABS, HGB, HCT, MCV, PLT in the last 72 hours. Basic Metabolic Panel  Recent Labs  09/19/14 1300  NA 138  K 3.5  CL 105  CO2 24  GLUCOSE 85  BUN 14  CREATININE 0.74  CALCIUM 9.0  MG 1.7  PHOS 3.4    TELE NSR with HR 50s, occasional HR high 40s    ECG  No new EKG  Echocardiogram 03/06/2014  LV EF: 45% -  50%  ------------------------------------------------------------------- Indications:   Mitral regurgitation 424.0.  ------------------------------------------------------------------- History:  PMH: Ischemic Cardiomyopathy. Left Bundle Branch Block. Acute Coronary Syndrome. Congestive heart failure. PMH: Myocardial infarction. Risk factors: Hypertension. Dyslipidemia.  ------------------------------------------------------------------- Study Conclusions  - Left ventricle: There is basal and mid inferior and inferoseptal hypokinesis. Systolic function was mildly reduced. The estimated ejection fraction was in the range of 45% to 50%. Wall motion was normal; there were no regional wall motion abnormalities. - Aortic valve: Structurally normal valve. Trileaflet; normal thickness leaflets. There was mild regurgitation. - Aorta: Mild non-mobile plaque   is present in the descending thoracic aorta. - Mitral valve: Mildly thickened leaflets . There was moderate regurgitation directed centrally. - Left atrium: The atrium was dilated. No evidence of thrombus in the atrial cavity or appendage. No evidence of thrombus in the atrial cavity or appendage. - Right ventricle: Systolic function was normal. - Right atrium: No evidence of thrombus in the atrial cavity or appendage. - Atrial septum: No defect or patent foramen ovale was identified. - Tricuspid valve: There was mild regurgitation.  Impressions:  - LVEF mildly  decreased estimated at 45%. Mitral valve is mildly thickened, there are multiple small centrally directed jets of mitral regurgitation associated with moderate regurgitation. No structural problem such as papillary musce rupture was seen.    Radiology/Studies  Ct Head Wo Contrast  09/19/2014   CLINICAL DATA:  Left facial droop, generalized weakness  EXAM: CT HEAD WITHOUT CONTRAST  TECHNIQUE: Contiguous axial images were obtained from the base of the skull through the vertex without contrast.  COMPARISON:  None  FINDINGS: Normal appearance of the intracranial structures. No evidence for acute hemorrhage, mass lesion, midline shift, hydrocephalus or large infarct. No acute bony abnormality. The visualized sinuses are clear. Stable small right corona radiata white matter hypodensity compatible with remote ischemia as before. No interval change.  IMPRESSION: No acute intracranial abnormality.  These results were called by telephone at the time of interpretation on 09/19/2014 at 11:45 am to Dr. Stewart, who verbally acknowledged these results.   Electronically Signed   By: M.  Shick M.D.   On: 09/19/2014 11:46   Nm Myocar Multi W/spect W/wall Motion / Ef  09/19/2014   CLINICAL DATA:  Chest pain.  EXAM: MYOCARDIAL IMAGING WITH SPECT (REST AND PHARMACOLOGIC-STRESS)  GATED LEFT VENTRICULAR WALL MOTION STUDY  LEFT VENTRICULAR EJECTION FRACTION  TECHNIQUE: Standard myocardial SPECT imaging was performed after resting intravenous injection of 10 mCi Tc-99m sestamibi. Subsequently, intravenous infusion of Lexiscan was performed under the supervision of the Cardiology staff. At peak effect of the drug, 30 mCi Tc-99m sestamibi was injected intravenously and standard myocardial SPECT imaging was performed. Quantitative gated imaging was also performed to evaluate left ventricular wall motion, and estimate left ventricular ejection fraction.  COMPARISON:  None.  FINDINGS: Perfusion: Decreased activity in  inferolateral wall on the stress images suggesting ischemia. Breast attenuation of the low anterior wall is also noted.  Wall Motion: Left ventricular wall motion abnormality in the area of ischemia. Mild global hypokinesis. Ventricular dilatation.  Left Ventricular Ejection Fraction: 39 %  End diastolic volume 143 ml  End systolic volume 87 ml  IMPRESSION: 1. Moderate area of inferolateral ischemia.  2. Global hypokinesis and wall motion abnormality in the inferolateral region.  3. Left ventricular ejection fraction 39%  4. High-risk stress test findings*.  *2012 Appropriate Use Criteria for Coronary Revascularization Focused Update: J Am Coll Cardiol. 2012;59(9):857-881. http://content.onlinejacc.org/article.aspx?articleid=1201161   Electronically Signed   By: P.  Gallerani M.D.   On: 09/19/2014 14:14    ASSESSMENT AND PLAN  49 yo female with h/o ICM 40-45% by TEE and moderate MR, CAD s/p DES to LCx 01/2013, last cath 04/2013 widely patent stent transferred from [place] [facility] with CP. Underwent stress test which came back high risk. Had reaction during stress test, code stroke called, but CT of head negative.   1. Atypical chest pain, ?musculoskeletal - persistent CP >48 hrs with negative trop. Does have more inferior lead TWI, but CP very atypical going from L axilla down to L breast -   lasix and BB added, Cr stable. Off home Imdur, on nitro patch. SBP 90-110.  - lexiscan 09/19/2014 high risk, EF 39%, moderate area of inferolateral ischemia  - pending cath today at 1pm by Dr. Kelly. Risk and benefit of procedure explained to the patient who display clear understanding and agree to proceed. Discussed with patient possible procedural risk include bleeding, vascular injury, renal injury, arrythmia, MI, stroke and loss of limb or life.  - sinus brady with HR 40-50s, already on lowest dose of metoprolol. May need to hold if symptomatic. Also ask for letter of functional  restriction for social services, says she called Dr. Varanasi's office and was told to get from cardiology team in the hospital.   2. CAD s/p DES to LCx 01/2013 - last cath 04/2013 widely patent stent 3. HTN 4. HLD 5. LBBB 6. H/o TIA  - had 4 extremities rigidity during stress test, code stroke called, negative CT of head.  Signed, Meng, Hao PA-C Pager: [phone]  I have seen and examined the patient along with Meng, Hao PA-C.  I have reviewed the chart, notes and new data.  I agree with NP's note.  Key new complaints: persistent chest discomfort, she does not appear to be in distress Key examination changes: paradoxically split S2 Key new findings / data: inferolateral ischemia (mild) on nuclear scan. EF 39%  PLAN: Cath today, possible PCI-stent. This procedure has been fully reviewed with the patient and written informed consent has been obtained.   Mihai Croitoru, MD, FACC [facility] [phone] 09/21/2014, 10:26 AM  

## 2014-09-21 NOTE — H&P (View-Only) (Signed)
Patient Name: Lindsay Camacho Date of Encounter: 09/21/2014  Primary Cardiologist: Dr. Eldridge Dace   Principal Problem:   Intermediate coronary syndrome Active Problems:   HTN (hypertension)   LBBB (left bundle branch block)   Tobacco abuse   CAD - CFX DES 01/17/13   Chest pain   Hyperlipidemia with target LDL less than 70    SUBJECTIVE  Denies any SOB, CP never left since arrival. Want letter for social service  CURRENT MEDS . aspirin EC  81 mg Oral Daily  . atorvastatin  40 mg Oral q1800  . clopidogrel  75 mg Oral Q breakfast  . enoxaparin (LOVENOX) injection  40 mg Subcutaneous Q24H  . furosemide  20 mg Oral Daily  . metoprolol tartrate  12.5 mg Oral BID  . nicotine  14 mg Transdermal Daily  . nitroGLYCERIN  1 inch Topical 3 times per day  . polyethylene glycol  17 g Oral Daily  . potassium chloride  10 mEq Oral Daily  . sodium chloride  3 mL Intravenous Q12H  . sodium chloride  3 mL Intravenous Q12H    OBJECTIVE  Filed Vitals:   09/20/14 1959 09/21/14 0457 09/21/14 0802 09/21/14 0805  BP: 117/64 90/62 100/66 100/66  Pulse: 58 63 50 50  Temp: 97.2 F (36.2 C) 98 F (36.7 C) 97.6 F (36.4 C) 97.6 F (36.4 C)  TempSrc: Oral Oral Oral Oral  Resp: 16     Height:      Weight:      SpO2: 98% 99%  99%    Intake/Output Summary (Last 24 hours) at 09/21/14 0819 Last data filed at 09/21/14 0800  Gross per 24 hour  Intake      0 ml  Output      0 ml  Net      0 ml   Filed Weights   09/18/14 0558 09/19/14 0500 09/20/14 0500  Weight: 118 lb 9.6 oz (53.797 kg) 118 lb 3.2 oz (53.615 kg) 117 lb 6.4 oz (53.252 kg)    PHYSICAL EXAM  General: Pleasant, NAD. Neuro: Alert and oriented X 3. Moves all extremities spontaneously. Psych: Normal affect. HEENT:  Normal  Neck: Supple without bruits or JVD. Lungs:  Resp regular and unlabored, CTA. Heart: RRR no s3, s4, or murmurs. Abdomen: Soft, non-tender, non-distended, BS + x 4.  Extremities: No clubbing, cyanosis or  edema. DP/PT/Radials 2+ and equal bilaterally.  Accessory Clinical Findings  CBC No results for input(s): WBC, NEUTROABS, HGB, HCT, MCV, PLT in the last 72 hours. Basic Metabolic Panel  Recent Labs  09/19/14 1300  NA 138  K 3.5  CL 105  CO2 24  GLUCOSE 85  BUN 14  CREATININE 0.74  CALCIUM 9.0  MG 1.7  PHOS 3.4    TELE NSR with HR 50s, occasional HR high 40s    ECG  No new EKG  Echocardiogram 03/06/2014  LV EF: 45% -  50%  ------------------------------------------------------------------- Indications:   Mitral regurgitation 424.0.  ------------------------------------------------------------------- History:  PMH: Ischemic Cardiomyopathy. Left Bundle Branch Block. Acute Coronary Syndrome. Congestive heart failure. PMH: Myocardial infarction. Risk factors: Hypertension. Dyslipidemia.  ------------------------------------------------------------------- Study Conclusions  - Left ventricle: There is basal and mid inferior and inferoseptal hypokinesis. Systolic function was mildly reduced. The estimated ejection fraction was in the range of 45% to 50%. Wall motion was normal; there were no regional wall motion abnormalities. - Aortic valve: Structurally normal valve. Trileaflet; normal thickness leaflets. There was mild regurgitation. - Aorta: Mild non-mobile plaque  is present in the descending thoracic aorta. - Mitral valve: Mildly thickened leaflets . There was moderate regurgitation directed centrally. - Left atrium: The atrium was dilated. No evidence of thrombus in the atrial cavity or appendage. No evidence of thrombus in the atrial cavity or appendage. - Right ventricle: Systolic function was normal. - Right atrium: No evidence of thrombus in the atrial cavity or appendage. - Atrial septum: No defect or patent foramen ovale was identified. - Tricuspid valve: There was mild regurgitation.  Impressions:  - LVEF mildly  decreased estimated at 45%. Mitral valve is mildly thickened, there are multiple small centrally directed jets of mitral regurgitation associated with moderate regurgitation. No structural problem such as papillary musce rupture was seen.    Radiology/Studies  Ct Head Wo Contrast  09/19/2014   CLINICAL DATA:  Left facial droop, generalized weakness  EXAM: CT HEAD WITHOUT CONTRAST  TECHNIQUE: Contiguous axial images were obtained from the base of the skull through the vertex without contrast.  COMPARISON:  None  FINDINGS: Normal appearance of the intracranial structures. No evidence for acute hemorrhage, mass lesion, midline shift, hydrocephalus or large infarct. No acute bony abnormality. The visualized sinuses are clear. Stable small right corona radiata white matter hypodensity compatible with remote ischemia as before. No interval change.  IMPRESSION: No acute intracranial abnormality.  These results were called by telephone at the time of interpretation on 09/19/2014 at 11:45 am to Dr. Roseanne Reno, who verbally acknowledged these results.   Electronically Signed   By: Judie Petit.  Shick M.D.   On: 09/19/2014 11:46   Nm Myocar Multi W/spect W/wall Motion / Ef  09/19/2014   CLINICAL DATA:  Chest pain.  EXAM: MYOCARDIAL IMAGING WITH SPECT (REST AND PHARMACOLOGIC-STRESS)  GATED LEFT VENTRICULAR WALL MOTION STUDY  LEFT VENTRICULAR EJECTION FRACTION  TECHNIQUE: Standard myocardial SPECT imaging was performed after resting intravenous injection of 10 mCi Tc-62m sestamibi. Subsequently, intravenous infusion of Lexiscan was performed under the supervision of the Cardiology staff. At peak effect of the drug, 30 mCi Tc-43m sestamibi was injected intravenously and standard myocardial SPECT imaging was performed. Quantitative gated imaging was also performed to evaluate left ventricular wall motion, and estimate left ventricular ejection fraction.  COMPARISON:  None.  FINDINGS: Perfusion: Decreased activity in  inferolateral wall on the stress images suggesting ischemia. Breast attenuation of the low anterior wall is also noted.  Wall Motion: Left ventricular wall motion abnormality in the area of ischemia. Mild global hypokinesis. Ventricular dilatation.  Left Ventricular Ejection Fraction: 39 %  End diastolic volume 143 ml  End systolic volume 87 ml  IMPRESSION: 1. Moderate area of inferolateral ischemia.  2. Global hypokinesis and wall motion abnormality in the inferolateral region.  3. Left ventricular ejection fraction 39%  4. High-risk stress test findings*.  *2012 Appropriate Use Criteria for Coronary Revascularization Focused Update: J Am Coll Cardiol. 2012;59(9):857-881. http://content.dementiazones.com.aspx?articleid=1201161   Electronically Signed   By: Rudie Meyer M.D.   On: 09/19/2014 14:14    ASSESSMENT AND PLAN  49 yo female with h/o ICM 40-45% by TEE and moderate MR, CAD s/p DES to LCx 01/2013, last cath 04/2013 widely patent stent transferred from [facility] with CP. Underwent stress test which came back high risk. Had reaction during stress test, code stroke called, but CT of head negative.   1. Atypical chest pain, ?musculoskeletal - persistent CP >48 hrs with negative trop. Does have more inferior lead TWI, but CP very atypical going from L axilla down to L breast -  lasix and BB added, Cr stable. Off home Imdur, on nitro patch. SBP 90-110.  - lexiscan 09/19/2014 high risk, EF 39%, moderate area of inferolateral ischemia  - pending cath today at 1pm by Dr. Tresa Endo. Risk and benefit of procedure explained to the patient who display clear understanding and agree to proceed. Discussed with patient possible procedural risk include bleeding, vascular injury, renal injury, arrythmia, MI, stroke and loss of limb or life.  - sinus brady with HR 40-50s, already on lowest dose of metoprolol. May need to hold if symptomatic. Also ask for letter of functional  restriction for social services, says she called Dr. Hoyle Barr office and was told to get from cardiology team in the hospital.   2. CAD s/p DES to LCx 01/2013 - last cath 04/2013 widely patent stent 3. HTN 4. HLD 5. LBBB 6. H/o TIA  - had 4 extremities rigidity during stress test, code stroke called, negative CT of head.  Ramond Dial PA-C Pager: [phone]  I have seen and examined the patient along with Azalee Course PA-C.  I have reviewed the chart, notes and new data.  I agree with NP's note.  Key new complaints: persistent chest discomfort, she does not appear to be in distress Key examination changes: paradoxically split S2 Key new findings / data: inferolateral ischemia (mild) on nuclear scan. EF 39%  PLAN: Cath today, possible PCI-stent. This procedure has been fully reviewed with the patient and written informed consent has been obtained.   Thurmon Fair, MD, [facility] [phone] 09/21/2014, 10:26 AM

## 2014-09-21 NOTE — Progress Notes (Signed)
Site area: lt groin fa sheath Site Prior to Removal:  Level  0 Pressure Applied For:  20 minutes Manual:    yes Patient Status During Pull:  stable Post Pull Site:  Level  0 Post Pull Instructions Given:  yes Post Pull Pulses Present: yes Dressing Applied:  tegaderm Bedrest begins @   1620 Comments:  0

## 2014-09-21 NOTE — Telephone Encounter (Signed)
Pt states that she is currently an inpatient at [facility] states she currently does not have a job.  Pt states that she has been told that she has restrictions on using her arms.  Pt asking for a note stating that she is not able to work at the current time. Pt advised to talk with Case Manager about financial resources/help, Epic indicates case management consult was done 09/18/14. Pt advised to discuss with medical staff at hospital about her physical limitations/in ability to work at this time.

## 2014-09-21 NOTE — Interval H&P Note (Signed)
Cath Lab Visit (complete for each Cath Lab visit)  Clinical Evaluation Leading to the Procedure:   ACS: No.  Non-ACS:    Anginal Classification: CCS IV  Anti-ischemic medical therapy: Maximal Therapy (2 or more classes of medications)  Non-Invasive Test Results: High-risk stress test findings: cardiac mortality >3%/year  Prior CABG: No previous CABG      History and Physical Interval Note:  09/21/2014 2:38 PM  [name]  has presented today for surgery, with the diagnosis of unstable angina  The various methods of treatment have been discussed with the patient and family. After consideration of risks, benefits and other options for treatment, the patient has consented to  Procedure(s): Left Heart Cath and Coronary Angiography (N/A) as a surgical intervention .  The patient's history has been reviewed, patient examined, no change in status, stable for surgery.  I have reviewed the patient's chart and labs.  Questions were answered to the patient's satisfaction.     KELLY,THOMAS A

## 2014-09-22 ENCOUNTER — Encounter (HOSPITAL_COMMUNITY): Payer: Self-pay | Admitting: Cardiovascular Disease

## 2014-09-22 ENCOUNTER — Inpatient Hospital Stay (HOSPITAL_COMMUNITY): Payer: BLUE CROSS/BLUE SHIELD

## 2014-09-22 DIAGNOSIS — E785 Hyperlipidemia, unspecified: Secondary | ICD-10-CM

## 2014-09-22 DIAGNOSIS — I25118 Atherosclerotic heart disease of native coronary artery with other forms of angina pectoris: Secondary | ICD-10-CM

## 2014-09-22 MED ORDER — ATORVASTATIN CALCIUM 80 MG PO TABS: 80.0000 mg | ORAL_TABLET | Freq: Every day | ORAL | Status: DC

## 2014-09-22 MED ORDER — METOPROLOL TARTRATE 25 MG PO TABS: 12.5000 mg | ORAL_TABLET | Freq: Two times a day (BID) | ORAL | Status: DC

## 2014-09-22 MED ORDER — FENTANYL CITRATE (PF) 100 MCG/2ML IJ SOLN: 25.0000 ug | Freq: Once | INTRAMUSCULAR | Status: DC

## 2014-09-22 NOTE — Discharge Summary (Signed)
Physician Discharge Summary  Patient ID: Lindsay Camacho MRN: [id number] DOB/AGE: [id number] y.o.  Primary Cardiologist: Dr. Eldridge Dace   Admit date: 09/17/2014 Discharge date: 09/22/2014  Admission Diagnoses: Intermediate Coronary Syndrome  Discharge Diagnoses:  Principal Problem:   Intermediate coronary syndrome Active Problems:   HTN (hypertension)   LBBB (left bundle branch block)   Tobacco abuse   CAD - CFX DES 01/17/13   Chest pain   Hyperlipidemia with target LDL less than 70   Discharged Condition: stable  Hospital Course: the patient is a 49 year old female, followed by Dr. Eldridge Dace, with a long-standing history of tobacco use for over 40 years, known CAD, HTN and HLD. In January 2015 she underwent stenting of her circumflex vessel. She developed recurrent chest pain in April 2015 and underwent repeat cardiac catheterization which revealed a patent stent. Cath was complicated by right groin retroperitoneal bleed requiring surgery.   She initially presented to [facility] on 09/17/14 after awakening with severe chest tightness. She was transferred to [facility] for w/u and further management. She ruled out for MI with negative troponins. She underwent a NST that was felt to be high risk. There was decreased activity in inferolateral wall on the stress images suggesting ischemia. Susbsequently, she is referred for repeat cardiac catheterization. The procedure was performed by Dr. Tresa Endo, she was found to have mild nonobstructive CAD with a normal LAD, widely patent stent in the proximal circumflex coronary artery , and dominant RCA with smooth 30% tubular narrowing in the mid segment and 30% distal narrowing prior to the PDA takeoff. LVF was normal by cath with an EF of 50%. Continued medical therapy and smoking cessation was strongly advised. She left the cath lab in stable condition. She was continued on ASA, Plavix, metoprolol, Imdur and Lipitor. Her Lipitor was increased to 80 mg due  to elevated LDL of 131. She was monitored post cath and had no complications. Her cath access site remained stable as did her renal function and vital signs. She was last seen and examined by Dr. Royann Shivers who determined she was stable for discharge home. Post hospital f/u has been arranged for 10/05/14 with Herma Carson, PA-C. She was given a note to stay out of work until her f/u appointment.   Consults: None  Significant Diagnostic Studies:  NST 09/19/14 FINDINGS: Perfusion: Decreased activity in inferolateral wall on the stress images suggesting ischemia. Breast attenuation of the low anterior wall is also noted.  Wall Motion: Left ventricular wall motion abnormality in the area of ischemia. Mild global hypokinesis. Ventricular dilatation.  Left Ventricular Ejection Fraction: 39 %  End diastolic volume 143 ml  End systolic volume 87 ml  IMPRESSION: 1. Moderate area of inferolateral ischemia.  2. Global hypokinesis and wall motion abnormality in the inferolateral region.  3. Left ventricular ejection fraction 39%  4. High-risk stress test findings*.  LHC 09/21/14 Conclusion     Mid RCA lesion, 30% stenosed.  Dist RCA lesion, 30% stenosed.  The left ventricular systolic function is normal.  Low-normal LV function with an ejection fraction of 50% and evidence for mild distal anterolateral focal hypocontractility.  Mild nonobstructive CAD with a normal LAD, widely patent stent in the proximal circumflex coronary artery , and dominant RCA with smooth 30% tubular narrowing in the mid segment and 30% distal narrowing prior to the PDA takeoff.  RECOMMENDATION: Medical therapy. Smoking cessation is essential.     Treatments: See Hospital Course  Discharge Exam: Blood pressure 91/67, pulse 59, temperature  98.4 F (36.9 C), temperature source Oral, resp. rate 20, height 5' 1.5" (1.562 m), weight 119 lb 9.6 oz (54.25 kg), SpO2 100 %.   Disposition: 01-Home or Self  Care      Discharge Instructions    Diet - low sodium heart healthy    Complete by:  As directed      Increase activity slowly    Complete by:  As directed             Medication List    STOP taking these medications        ibuprofen 400 MG tablet  Commonly known as:  ADVIL,MOTRIN      TAKE these medications        acetaminophen 325 MG tablet  Commonly known as:  TYLENOL  Take 325 mg by mouth every 6 (six) hours as needed for moderate pain.     aspirin 81 MG tablet  Take 1 tablet (81 mg total) by mouth daily.     atorvastatin 80 MG tablet  Commonly known as:  LIPITOR  Take 1 tablet (80 mg total) by mouth daily.     clopidogrel 75 MG tablet  Commonly known as:  PLAVIX  Take 1 tablet (75 mg total) by mouth daily with breakfast.     furosemide 20 MG tablet  Commonly known as:  LASIX  Take one tablet by mouth daily.  May take an extra 20 mg tablet daily as needed for shortness of breath or fluid overload.     isosorbide mononitrate 30 MG 24 hr tablet  Commonly known as:  IMDUR  Take 1 tablet (30 mg total) by mouth daily.     methocarbamol 500 MG tablet  Commonly known as:  ROBAXIN  Take 2 tablets (1,000 mg total) by mouth every 8 (eight) hours as needed for muscle spasms.     metoprolol tartrate 25 MG tablet  Commonly known as:  LOPRESSOR  Take 0.5 tablets (12.5 mg total) by mouth 2 (two) times daily.     nitroGLYCERIN 0.4 MG SL tablet  Commonly known as:  NITROSTAT  Place 1 tablet (0.4 mg total) under the tongue every 5 (five) minutes x 3 doses as needed for chest pain.     potassium chloride 10 MEQ tablet  Commonly known as:  K-DUR  Take 1 tablet (10 mEq total) by mouth daily.       Follow-up Information    Follow up with [facility] On 09/24/2014.   Why:  Hospital follow-up appointment on 09/24/14 at 10:30am at [facility]. Ms. Mond able to use [facility] pharmacy ([address] [place], [place],  [place] [address]) if needed for medications.   Contact information:   [address] [place] [address] [phone]      Follow up with Jacolyn Reedy, PA-C On 10/05/2014.   Specialty:  Cardiology   Why:  2:15 pm (Dr. Hoyle Barr PA)   Contact information:   [address] [place] [address] [phone]      TIME SPENT ON DISCHARGE, INCLUDING PHYSICIAN TIME: >30 MINUTES  Signed: Robbie Lis 09/22/2014, 9:13 AM

## 2014-09-22 NOTE — Progress Notes (Signed)
CT of abdomen obtained and negative results.

## 2014-09-22 NOTE — Progress Notes (Signed)
Patient Profile: 49 year old female with a long-standing history of tobacco use for over 40 years. In January 2015 she underwent stenting of her circumflex vessel. She developed recurrent chest pain in April 2015 and underwent repeat cardiac catheterization which revealed a patent stent. Cath was complicated by right groin retroperitoneal bleed requiring surgery. She has continued to smoke cigarettes. She was recently transferred from [facility] after awakening with severe chest tightness. She ruled out with negative troponins but had a high risk stress test. She was referred for repeat cardiac catheterization, which showed mild nonobstructibe CAD with widely patent circumflex stent.   Subjective: No complaints.   Objective: Vital signs in last 24 hours: Temp:  [97.6 F (36.4 C)-98.4 F (36.9 C)] 98.4 F (36.9 C) (09/20 0424) Pulse Rate:  [0-268] 59 (09/20 0521) Resp:  [0-55] 20 (09/20 0521) BP: (76-134)/(54-82) 91/67 mmHg (09/20 0521) SpO2:  [0 %-100 %] 100 % (09/20 0521) Weight:  [119 lb 9.6 oz (54.25 kg)] 119 lb 9.6 oz (54.25 kg) (09/20 0424) Last BM Date: 09/20/14  Intake/Output from previous day: 09/19 0701 - 09/20 0700 In: 1535.6 [P.O.:490; I.V.:1045.6] Out: 0  Intake/Output this shift:    Medications Current Facility-Administered Medications  Medication Dose Route Frequency Provider Last Rate Last Dose  . 0.9 %  sodium chloride infusion  250 mL Intravenous PRN Leone Brand, NP      . 0.9 %  sodium chloride infusion  250 mL Intravenous PRN Lennette Bihari, MD      . acetaminophen (TYLENOL) tablet 650 mg  650 mg Oral Q4H PRN Leone Brand, NP   650 mg at 09/20/14 1703  . ALPRAZolam Prudy Feeler) tablet 0.25 mg  0.25 mg Oral BID PRN Leone Brand, NP   0.25 mg at 09/21/14 1558  . aspirin EC tablet 81 mg  81 mg Oral Daily Leone Brand, NP   81 mg at 09/20/14 0932  . atorvastatin (LIPITOR) tablet 40 mg  40 mg Oral q1800 Leone Brand, NP   40 mg at 09/21/14 1751  .  clopidogrel (PLAVIX) tablet 75 mg  75 mg Oral Q breakfast Leone Brand, NP   75 mg at 09/21/14 0844  . enoxaparin (LOVENOX) injection 40 mg  40 mg Subcutaneous Q24H Leone Brand, NP   40 mg at 09/21/14 1751  . furosemide (LASIX) tablet 20 mg  20 mg Oral Daily Leone Brand, NP   20 mg at 09/21/14 1028  . methocarbamol (ROBAXIN) tablet 1,000 mg  1,000 mg Oral Q8H PRN Leone Brand, NP   1,000 mg at 09/19/14 0830  . metoprolol tartrate (LOPRESSOR) tablet 12.5 mg  12.5 mg Oral BID Leone Brand, NP   12.5 mg at 09/21/14 1033  . morphine 2 MG/ML injection 2 mg  2 mg Intravenous Q4H PRN Azalee Course, PA   2 mg at 09/22/14 0521  . nicotine (NICODERM CQ - dosed in mg/24 hours) patch 14 mg  14 mg Transdermal Daily Leone Brand, NP   Stopped at 09/21/14 1029  . nitroGLYCERIN (NITROGLYN) 2 % ointment 1 inch  1 inch Topical 3 times per day Leone Brand, NP   1 inch at 09/22/14 0521  . nitroGLYCERIN (NITROSTAT) SL tablet 0.4 mg  0.4 mg Sublingual Q5 Min x 3 PRN Leone Brand, NP      . ondansetron [facility]) injection 4 mg  4 mg Intravenous Q6H PRN Leone Brand, NP   4 mg at  09/19/14 1745  . polyethylene glycol (MIRALAX / GLYCOLAX) packet 17 g  17 g Oral Daily Jake Bathe, MD   17 g at 09/20/14 0932  . potassium chloride (K-DUR) CR tablet 10 mEq  10 mEq Oral Daily Leone Brand, NP   10 mEq at 09/21/14 1028  . sodium chloride 0.9 % injection 3 mL  3 mL Intravenous Q12H Leone Brand, NP   3 mL at 09/21/14 2244  . sodium chloride 0.9 % injection 3 mL  3 mL Intravenous PRN Leone Brand, NP      . sodium chloride 0.9 % injection 3 mL  3 mL Intravenous Q12H Lennette Bihari, MD   3 mL at 09/22/14 0520  . sodium chloride 0.9 % injection 3 mL  3 mL Intravenous PRN Lennette Bihari, MD      . zolpidem [facility]) tablet 5 mg  5 mg Oral QHS PRN Leone Brand, NP   5 mg at 09/21/14 2245    PE: General appearance: alert, cooperative and no distress Neck: no carotid bruit and no JVD Lungs: clear to  auscultation bilaterally Chest: tenderness under left brest Heart: regular rate and rhythm, S1, S2 normal, no murmur, click, rub or gallop Extremities: no LEE Pulses: 2+ and symmetric Skin: warm and dry Neurologic: Grossly normal  Lab Results:  No results for input(s): WBC, HGB, HCT, PLT in the last 72 hours. BMET  Recent Labs  09/19/14 1300  NA 138  K 3.5  CL 105  CO2 24  GLUCOSE 85  BUN 14  CREATININE 0.74  CALCIUM 9.0   Cardiac Panel (last 3 results) No results for input(s): CKTOTAL, CKMB, TROPONINI, RELINDX in the last 72 hours.  Studies/Results: LHC 09/21/14 Conclusion     Mid RCA lesion, 30% stenosed.  Dist RCA lesion, 30% stenosed.  The left ventricular systolic function is normal.  Low-normal LV function with an ejection fraction of 50% and evidence for mild distal anterolateral focal hypocontractility.  Mild nonobstructive CAD with a normal LAD, widely patent stent in the proximal circumflex coronary artery , and dominant RCA with smooth 30% tubular narrowing in the mid segment and 30% distal narrowing prior to the PDA takeoff.  RECOMMENDATION: Medical therapy. Smoking cessation is essential.       Assessment/Plan  Principal Problem:   Intermediate coronary syndrome Active Problems:   HTN (hypertension)   LBBB (left bundle branch block)   Tobacco abuse   CAD - CFX DES 01/17/13   Chest pain   Hyperlipidemia with target LDL less than 70   1. CAD: negative cardiac enzymes x3. Diagnostic LHC yesterday showed mild nonobstructive CAD with normal LAD, widely patent stent in the proximal left circumflex, and a dominant RCA with smooth 30% tubular narrowing in the mid segment and 30% distal narrowing prior to the PDA takeoff. Medical therapy recommended along with smoking cessation. Continue ASA, Plavix, statin and BB.   2. Tobacco Abuse: smoking cessation strongly advised.   3. HTN: BP is well controlled.   4. HLD: on statin therapy. LDL poorly  controlled at 161. Will increase Lipitor to 80 mg   LOS: 5 days    Brittainy M. Delmer Islam 09/22/2014 8:33 AM  I have seen and examined the patient along with Brittainy M. Sharol Harness, PA-C.  I have reviewed the chart, notes and new data.  I agree with PA's note.   PLAN: Chest pain persists, now she is clearly tender to palpation over left lateral chest.  No lesions seen. Healthy left groin cath site. No significant coronary stenoses seen on cath. Chest pain is non cardiac. DC home later this AM. Smoking cessation  Thurmon Fair, MD, [facility] [phone] 09/22/2014, 8:54 AM

## 2014-09-22 NOTE — Discharge Instructions (Signed)
Cath Site Care Refer to this sheet in the next few weeks. These instructions provide you with information on caring for yourself after your procedure. Your caregiver may also give you more specific instructions. Your treatment has been planned according to current medical practices, but problems sometimes occur. Call your caregiver if you have any problems or questions after your procedure. HOME CARE INSTRUCTIONS  You may shower the day after the procedure.Remove the bandage (dressing) and gently wash the site with plain soap and water.Gently pat the site dry.  Do not apply powder or lotion to the site.  Do not submerge the affected site in water for 3 to 5 days.  Inspect the site at least twice daily.  Do not flex or bend the affected extremity for 24 hours.  No lifting over 5 pounds (2.3 kg) for 5 days after your procedure.  Do not drive home if you are discharged the same day of the procedure. Have someone else drive you.  You may drive 24 hours after the procedure unless otherwise instructed by your caregiver.  Do not operate machinery or power tools for 24 hours.  A responsible adult should be with you for the first 24 hours after you arrive home. What to expect:  Any bruising will usually fade within 1 to 2 weeks.  Blood that collects in the tissue (hematoma) may be painful to the touch. It should usually decrease in size and tenderness within 1 to 2 weeks. SEEK IMMEDIATE MEDICAL CARE IF:  You have unusual pain at the radial site.  You have redness, warmth, swelling, or pain at the radial site.  You have drainage (other than a small amount of blood on the dressing).  You have chills.  You have a fever or persistent symptoms for more than 72 hours.  You have a fever and your symptoms suddenly get worse.  Your arm becomes pale, cool, tingly, or numb.  You have heavy bleeding from the site. Hold pressure on the site. Document Released: 01/21/2010 Document Revised:  03/13/2011 Document Reviewed: 01/21/2010 [facility] Patient Information 2015 [place], [place]. This information is not intended to replace advice given to you by your health care provider. Make sure you discuss any questions you have with your health care provider.

## 2014-09-22 NOTE — Progress Notes (Addendum)
Paged by nursing staff as patient is screaming in bed complain of L groin pain. Previously pending discharge. Did not appreciate significant bleeding or bruit on exam. Patient unable to lay still, therefore unable to assess pulsatile mass.   Will obtain CT of abdomen and pelvis to rule out bleeding or pseudoaneurysm. Questionable drug seeking behavior and unusual neurological symptom a few days ago noted.  Ramond Dial PA Pager: [phone]  Addendum: CT of abdomen negative, will discharge patient.

## 2014-09-22 NOTE — Progress Notes (Signed)
Called to patient room as patient was screaming "it hurts!!" Other RN started holding pressure on left groin site as patient screaming and wailing. Cath lab called, Cardiology called, Charge called to come assess patient. 2 mg morphine given to patient to help with pain as everyone came to assess.Patients site remains the same as previously assessed as a level zero. No discoloration other than the sites from the lovenox shots pt has been receiving. PA at bedside, Cath lab at bedside, Charge at bedside. CT of abdomen ordered. Xanax given to patient. Will continue to monitor.

## 2014-09-24 ENCOUNTER — Encounter: Payer: Self-pay | Admitting: Family Medicine

## 2014-09-24 ENCOUNTER — Ambulatory Visit (INDEPENDENT_AMBULATORY_CARE_PROVIDER_SITE_OTHER): Payer: BLUE CROSS/BLUE SHIELD | Admitting: Family Medicine

## 2014-09-24 VITALS — BP 99/73 | HR 70 | Temp 97.6°F | Resp 18 | Ht 61.5 in | Wt 119.0 lb

## 2014-09-24 DIAGNOSIS — Z720 Tobacco use: Secondary | ICD-10-CM

## 2014-09-24 DIAGNOSIS — F329 Major depressive disorder, single episode, unspecified: Secondary | ICD-10-CM

## 2014-09-24 DIAGNOSIS — Z1231 Encounter for screening mammogram for malignant neoplasm of breast: Secondary | ICD-10-CM

## 2014-09-24 DIAGNOSIS — F32A Depression, unspecified: Secondary | ICD-10-CM

## 2014-09-24 DIAGNOSIS — I10 Essential (primary) hypertension: Secondary | ICD-10-CM | POA: Diagnosis not present

## 2014-09-24 DIAGNOSIS — K5900 Constipation, unspecified: Secondary | ICD-10-CM

## 2014-09-24 DIAGNOSIS — E785 Hyperlipidemia, unspecified: Secondary | ICD-10-CM

## 2014-09-24 DIAGNOSIS — I5032 Chronic diastolic (congestive) heart failure: Secondary | ICD-10-CM

## 2014-09-24 LAB — POCT URINALYSIS DIP (DEVICE)
BILIRUBIN URINE: NEGATIVE
Glucose, UA: NEGATIVE mg/dL
HGB URINE DIPSTICK: NEGATIVE
KETONES UR: NEGATIVE mg/dL
LEUKOCYTES UA: NEGATIVE
Nitrite: NEGATIVE
Protein, ur: NEGATIVE mg/dL
SPECIFIC GRAVITY, URINE: 1.015 (ref 1.005–1.030)
Urobilinogen, UA: 1 mg/dL (ref 0.0–1.0)
pH: 7 (ref 5.0–8.0)

## 2014-09-24 MED ORDER — NICOTINE 14 MG/24HR TD PT24: 14.0000 mg | MEDICATED_PATCH | Freq: Every day | TRANSDERMAL | Status: DC

## 2014-09-24 MED ORDER — SERTRALINE HCL 50 MG PO TABS: 50.0000 mg | ORAL_TABLET | Freq: Every day | ORAL | Status: DC

## 2014-09-24 MED ORDER — DOCUSATE SODIUM 100 MG PO CAPS: 100.0000 mg | ORAL_CAPSULE | Freq: Two times a day (BID) | ORAL | Status: DC

## 2014-09-24 NOTE — Patient Instructions (Addendum)
Start Zoloft 50 mg for depression. Will send referral to counseling for depression. Follow up in 6 weeks.  Follow up with cardiologist as scheduled Will send referral for screening mammogramDepression Depression refers to feeling sad, low, down in the dumps, blue, gloomy, or empty. In general, there are two kinds of depression:  Normal sadness or normal grief. This kind of depression is one that we all feel from time to time after upsetting life experiences, such as the loss of a job or the ending of a relationship. This kind of depression is considered normal, is short lived, and resolves within a few days to 2 weeks. Depression experienced after the loss of a loved one (bereavement) often lasts longer than 2 weeks but normally gets better with time.  Clinical depression. This kind of depression lasts longer than normal sadness or normal grief or interferes with your ability to function at home, at work, and in school. It also interferes with your personal relationships. It affects almost every aspect of your life. Clinical depression is an illness. Symptoms of depression can also be caused by conditions other than those mentioned above, such as:  Physical illness. Some physical illnesses, including underactive thyroid gland (hypothyroidism), severe anemia, specific types of cancer, diabetes, uncontrolled seizures, heart and lung problems, strokes, and chronic pain are commonly associated with symptoms of depression.  Side effects of some prescription medicine. In some people, certain types of medicine can cause symptoms of depression.  Substance abuse. Abuse of alcohol and illicit drugs can cause symptoms of depression. SYMPTOMS Symptoms of normal sadness and normal grief include the following:  Feeling sad or crying for short periods of time.  Not caring about anything (apathy).  Difficulty sleeping or sleeping too much.  No longer able to enjoy the things you used to enjoy.  Desire to  be by oneself all the time (social isolation).  Lack of energy or motivation.  Difficulty concentrating or remembering.  Change in appetite or weight.  Restlessness or agitation. Symptoms of clinical depression include the same symptoms of normal sadness or normal grief and also the following symptoms:  Feeling sad or crying all the time.  Feelings of guilt or worthlessness.  Feelings of hopelessness or helplessness.  Thoughts of suicide or the desire to harm yourself (suicidal ideation).  Loss of touch with reality (psychotic symptoms). Seeing or hearing things that are not real (hallucinations) or having false beliefs about your life or the people around you (delusions and paranoia). DIAGNOSIS  The diagnosis of clinical depression is usually based on how bad the symptoms are and how long they have lasted. Your health care Marci Polito will also ask you questions about your medical history and substance use to find out if physical illness, use of prescription medicine, or substance abuse is causing your depression. Your health care Beecher Furio may also order blood tests. TREATMENT  Often, normal sadness and normal grief do not require treatment. However, sometimes antidepressant medicine is given for bereavement to ease the depressive symptoms until they resolve. The treatment for clinical depression depends on how bad the symptoms are but often includes antidepressant medicine, counseling with a mental health professional, or both. Your health care Zierra Laroque will help to determine what treatment is best for you. Depression caused by physical illness usually goes away with appropriate medical treatment of the illness. If prescription medicine is causing depression, talk with your health care Hammond Obeirne about stopping the medicine, decreasing the dose, or changing to another medicine. Depression caused by the  abuse of alcohol or illicit drugs goes away when you stop using these substances. Some  adults need professional help in order to stop drinking or using drugs. SEEK IMMEDIATE MEDICAL CARE IF:  You have thoughts about hurting yourself or others.  You lose touch with reality (have psychotic symptoms).  You are taking medicine for depression and have a serious side effect. FOR MORE INFORMATION  National Alliance on Mental Illness: www.nami.[employer] of Mental Health: http://www.maynard.net/ Document Released: 12/17/1999 Document Revised: 05/05/2013 Document Reviewed: 03/20/2011 [facility] Patient Information 2015 [place], [place]. This information is not intended to replace advice given to you by your health care Lucyann Romano. Make sure you discuss any questions you have with your health care Athziri Freundlich. DASH Eating Plan DASH stands for "Dietary Approaches to Stop Hypertension." The DASH eating plan is a healthy eating plan that has been shown to reduce high blood pressure (hypertension). Additional health benefits may include reducing the risk of type 2 diabetes mellitus, heart disease, and stroke. The DASH eating plan may also help with weight loss. WHAT DO I NEED TO KNOW ABOUT THE DASH EATING PLAN? For the DASH eating plan, you will follow these general guidelines:  Choose foods with a percent daily value for sodium of less than 5% (as listed on the food label).  Use salt-free seasonings or herbs instead of table salt or sea salt.  Check with your health care Abigial Newville or pharmacist before using salt substitutes.  Eat lower-sodium products, often labeled as "lower sodium" or "no salt added."  Eat fresh foods.  Eat more vegetables, fruits, and low-fat dairy products.  Choose whole grains. Look for the word "whole" as the first word in the ingredient list.  Choose fish and skinless chicken or [place] more often than red meat. Limit fish, poultry, and meat to 6 oz (170 g) each day.  Limit sweets, desserts, sugars, and sugary drinks.  Choose heart-healthy fats.  Limit  cheese to 1 oz (28 g) per day.  Eat more home-cooked food and less restaurant, buffet, and fast food.  Limit fried foods.  Cook foods using methods other than frying.  Limit canned vegetables. If you do use them, rinse them well to decrease the sodium.  When eating at a restaurant, ask that your food be prepared with less salt, or no salt if possible. WHAT FOODS CAN I EAT? Seek help from a dietitian for individual calorie needs. Grains Whole grain or whole wheat bread. Brown rice. Whole grain or whole wheat pasta. Quinoa, bulgur, and whole grain cereals. Low-sodium cereals. Corn or whole wheat flour tortillas. Whole grain cornbread. Whole grain crackers. Low-sodium crackers. Vegetables Fresh or frozen vegetables (raw, steamed, roasted, or grilled). Low-sodium or reduced-sodium tomato and vegetable juices. Low-sodium or reduced-sodium tomato sauce and paste. Low-sodium or reduced-sodium canned vegetables.  Fruits All fresh, canned (in natural juice), or frozen fruits. Meat and Other Protein Products Ground beef (85% or leaner), grass-fed beef, or beef trimmed of fat. Skinless chicken or [place]. Ground chicken or [place]. Pork trimmed of fat. All fish and seafood. Eggs. Dried beans, peas, or lentils. Unsalted nuts and seeds. Unsalted canned beans. Dairy Low-fat dairy products, such as skim or 1% milk, 2% or reduced-fat cheeses, low-fat ricotta or cottage cheese, or plain low-fat yogurt. Low-sodium or reduced-sodium cheeses. Fats and Oils Tub margarines without trans fats. Light or reduced-fat mayonnaise and salad dressings (reduced sodium). Avocado. Safflower, olive, or canola oils. Natural peanut or almond butter. Other Unsalted popcorn and pretzels. The items  listed above may not be a complete list of recommended foods or beverages. Contact your dietitian for more options. WHAT FOODS ARE NOT RECOMMENDED? Grains White bread. White pasta. White rice. Refined cornbread. Bagels and  croissants. Crackers that contain trans fat. Vegetables Creamed or fried vegetables. Vegetables in a cheese sauce. Regular canned vegetables. Regular canned tomato sauce and paste. Regular tomato and vegetable juices. Fruits Dried fruits. Canned fruit in light or heavy syrup. Fruit juice. Meat and Other Protein Products Fatty cuts of meat. Ribs, chicken wings, bacon, sausage, bologna, salami, chitterlings, fatback, hot dogs, bratwurst, and packaged luncheon meats. Salted nuts and seeds. Canned beans with salt. Dairy Whole or 2% milk, cream, half-and-half, and cream cheese. Whole-fat or sweetened yogurt. Full-fat cheeses or blue cheese. Nondairy creamers and whipped toppings. Processed cheese, cheese spreads, or cheese curds. Condiments Onion and garlic salt, seasoned salt, table salt, and sea salt. Canned and packaged gravies. Worcestershire sauce. Tartar sauce. Barbecue sauce. Teriyaki sauce. Soy sauce, including reduced sodium. Steak sauce. Fish sauce. Oyster sauce. Cocktail sauce. Horseradish. Ketchup and mustard. Meat flavorings and tenderizers. Bouillon cubes. Hot sauce. Tabasco sauce. Marinades. Taco seasonings. Relishes. Fats and Oils Butter, stick margarine, lard, shortening, ghee, and bacon fat. Coconut, palm kernel, or palm oils. Regular salad dressings. Other Pickles and olives. Salted popcorn and pretzels. The items listed above may not be a complete list of foods and beverages to avoid. Contact your dietitian for more information. WHERE CAN I FIND MORE INFORMATION? National Heart, Lung, and Blood Institute: CablePromo.it Document Released: 12/08/2010 Document Revised: 05/05/2013 Document Reviewed: 10/23/2012 [facility] Patient Information 2015 [place], [place]. This information is not intended to replace advice given to you by your health care Tyon Cerasoli. Make sure you discuss any questions you have with your health care Nusaybah Ivie.

## 2014-09-24 NOTE — Progress Notes (Signed)
Subjective:    Patient ID: Lindsay Camacho, female    DOB: 1965-03-13, 49 y.o.   MRN: [id number]  HPI  Ms. Lindsay Camacho, a 49 year old female with a history of depression, hypertension, stroke, coronary artery disease, and hyperlipidemia presents to establish care. She states that she has not had a primary provider in a number of years.  Patient is complaining of depression.  She complains of anhedonia, depressed mood, difficulty concentrating, feelings of worthlessness/guilt, hopelessness and insomnia. Onset was several years ago. She states that the initial onset of depression was following the death of a child. She states that her daughter [name] lived 7 minutes and passed away. She states that she also lost her husband in a tractor trailer accident a few years ago. She states that she has not been under the care of a therapist. She denies current suicidal or homicidal intent.  Patient also has a history of hypertensin, CAD, and Stroke. She is currently on anti-coagulation therapy and antihypertensives. She does not exercise or follow a low sodium diet. She is under the care of Dr. Lance Muss for coronary artery disease. Ms. Geisel has a history of ischemic cardiomyopathy. Her last ejection fraction was 40-45%.  [name] denies headache, shortness of breath, swelling to lower extremities, syncope, chest pains, or palpitations.   Past Medical History  Diagnosis Date  . Hypertension   . Stroke     tia with no deficits  . CAD (coronary artery disease) 11/13/2011    50% ? proximal LCx stenosis per Cath at [facility]  . PVD (peripheral vascular disease)     Right CFA stenosis per report on 11/14/2011  . HLD (hyperlipidemia)   . LBBB (left bundle branch block)   . Acute myocardial infarction of other lateral wall, initial episode of care 01/2013    DES CFX  . Intermediate coronary syndrome   . Chest pain, atypical, muscular skelatal   01/09/2014    Immunization History  Administered Date(s)  Administered  . Pneumococcal Polysaccharide-23 01/09/2014   Social History   Social History  . Marital Status: Single    Spouse Name: N/A  . Number of Children: N/A  . Years of Education: N/A   Occupational History  . Not on file.   Social History Main Topics  . Smoking status: Current Every Day Smoker -- 0.50 packs/day  . Smokeless tobacco: Never Used  . Alcohol Use: No  . Drug Use: Yes    Special: Marijuana  . Sexual Activity: Yes    Birth Control/ Protection: None   Other Topics Concern  . Not on file   Social History Narrative   Review of Systems  Constitutional: Negative for fever and fatigue.  HENT: Negative.   Eyes: Negative.  Negative for visual disturbance.  Respiratory: Negative.   Cardiovascular: Negative.   Gastrointestinal: Negative.   Endocrine: Negative.  Negative for polydipsia, polyphagia and polyuria.  Musculoskeletal: Negative.   Allergic/Immunologic: Negative for immunocompromised state.  Hematological: Negative.   Psychiatric/Behavioral: Negative.        Objective:   Physical Exam  Constitutional: She appears well-developed and well-nourished. She is cooperative. She does not have a sickly appearance. No distress.  HENT:  Head: Normocephalic and atraumatic.  Right Ear: Hearing, tympanic membrane, external ear and ear canal normal.  Left Ear: Hearing, tympanic membrane, external ear and ear canal normal.  Nose: Nose normal. No mucosal edema.  Mouth/Throat: She does not have dentures. Abnormal dentition. Dental caries present.  Eyes:  Conjunctivae, EOM and lids are normal. Pupils are equal, round, and reactive to light.  Fundoscopic exam:      The right eye shows red reflex.       The left eye shows red reflex.  Neck: Trachea normal.  Cardiovascular: Regular rhythm and normal pulses.   Pulmonary/Chest: Effort normal and breath sounds normal.  Abdominal: Soft. Normal appearance and bowel sounds are normal.  Lymphadenopathy:       Head  (right side): No submental and no submandibular adenopathy present.       Head (left side): No submental and no submandibular adenopathy present.  Neurological: She is alert. No cranial nerve deficit or sensory deficit.  Skin: Skin is warm, dry and intact.  Psychiatric: Her speech is normal. Judgment normal. Her mood appears not anxious. Her affect is not angry. She is not agitated, not slowed, not withdrawn and not actively hallucinating. Cognition and memory are normal. She exhibits a depressed mood. She expresses no homicidal and no suicidal ideation. She is attentive.      BP 99/73 mmHg  Pulse 70  Temp(Src) 97.6 F (36.4 C) (Oral)  Resp 18  Ht 5' 1.5" (1.562 m)  Wt 119 lb (53.978 kg)  BMI 22.12 kg/m2  SpO2 99% Assessment & Plan:   1. Depression Patient is very tearful during appointment. She states that she is willing to see a counselor for depression. She currently denies suicidal or homicidal ideations. Return to clinic for follow-up in 6 weeks.  - sertraline (ZOLOFT) 50 MG tablet; Take 1 tablet (50 mg total) by mouth daily.  Dispense: 30 tablet; Refill: 3 - Ambulatory referral to Behavioral Health  2. Essential hypertension Continue current regimen. Follow up with cardiology as scheduled.  - POCT urinalysis dipstick  3. Chronic diastolic heart failure Patient has a history of ischemic cardiomyopathy. Current ejection fraction was 45-55%. P Will follow up with Dr. Eldridge Dace as scheduled.   4. Hyperlipidemia with target LDL less than 70 Continue statin therapy  5. Constipation, unspecified constipation type  - docusate sodium (COLACE) 100 MG capsule; Take 1 capsule (100 mg total) by mouth 2 (two) times daily.  Dispense: 60 capsule; Refill: 0  6. Tobacco abuse Patient reports that she is ready to quit smoking. Smoking cessation instruction/counseling given:  counseled patient on the dangers of tobacco use, advised patient to stop smoking, and reviewed strategies to maximize  success - nicotine (NICODERM CQ) 14 mg/24hr patch; Place 1 patch (14 mg total) onto the skin daily.  Dispense: 28 patch; Refill: 0  7. Screening mammogram for high-risk patient  - MM DIGITAL SCREENING BILATERAL; Future  Preventative care:  Will schedule for a complete physical examination with pap smear.   Massie Maroon, FNP

## 2014-10-05 ENCOUNTER — Encounter: Payer: Self-pay | Admitting: Physician Assistant

## 2014-10-05 ENCOUNTER — Ambulatory Visit (INDEPENDENT_AMBULATORY_CARE_PROVIDER_SITE_OTHER): Payer: BLUE CROSS/BLUE SHIELD | Admitting: Physician Assistant

## 2014-10-05 VITALS — BP 140/82 | HR 54 | Ht 61.5 in | Wt 115.0 lb

## 2014-10-05 DIAGNOSIS — Z720 Tobacco use: Secondary | ICD-10-CM | POA: Diagnosis not present

## 2014-10-05 DIAGNOSIS — I25118 Atherosclerotic heart disease of native coronary artery with other forms of angina pectoris: Secondary | ICD-10-CM | POA: Diagnosis not present

## 2014-10-05 DIAGNOSIS — R197 Diarrhea, unspecified: Secondary | ICD-10-CM | POA: Insufficient documentation

## 2014-10-05 DIAGNOSIS — R0789 Other chest pain: Secondary | ICD-10-CM | POA: Diagnosis not present

## 2014-10-05 DIAGNOSIS — I10 Essential (primary) hypertension: Secondary | ICD-10-CM

## 2014-10-05 NOTE — Assessment & Plan Note (Signed)
Patient has cut back on smoking since starting the nicotine patches. Hopefully she will be able to quit.

## 2014-10-05 NOTE — Assessment & Plan Note (Addendum)
Blood pressure is up a little today but the patient is very uncomfortable with her GI problems. Will not make any changes today.

## 2014-10-05 NOTE — Patient Instructions (Signed)
Medication Instructions:   Your physician recommends that you continue on your current medications as directed. Please refer to the Current Medication list given to you today.   Labwork:  NONE ORDER TODAY    Testing/Procedures: NONE ORDER TODAY    Follow-Up:  WITH DR Abe People IN 2 TO 3 MONTHS   Any Other Special Instructions Will Be Listed Below (If Applicable).  PLEASE CONTACT  NURSE PRACTITIONER  LACHANA HOLLIS  ABOUT RULING OUT C DIFF

## 2014-10-05 NOTE — Assessment & Plan Note (Signed)
Patient had recent admission with abnormal nuclear stress test but cardiac catheterization showing mild nonobstructive CAD with widely patent circumflex stent. Please see above for details. LV function was normal. EF was 39% on stress test. She is being maintained on Plavix and aspirin. Chest pain is atypical and she has a lot of GI symptoms. Recommend follow-up with primary care for this. Follow-up with Dr.Varanasi in 2 months.

## 2014-10-05 NOTE — Assessment & Plan Note (Signed)
Patient is having significant foul-smelling watery diarrhea for the past 3 days. She's also having a lot of nausea and indigestion. 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 I'm concerned the patient may have C. difficile. I recommend she follow-up with her primary care for further workup and treatment.

## 2014-10-05 NOTE — Progress Notes (Signed)
Cardiology Office Note   Date:  10/05/2014   ID:  Lindsay Camacho, DOB February 12, 1965, MRN [id number]  PCP:  No PCP Per Patient  Cardiologist:  Dr. Eldridge Dace  Chief Complaint: Nausea and diarrhea.    History of Present Illness: Lindsay Camacho is a 49 y.o. female who presents for post hospital follow up. She has a history of CAD status post stenting of the circumflex in January 2015. She had recurrent chest pain in April 2015 and underwent repeat cardiac catheterization revealing a patent stent. Cath was complicated by right groin retroperitoneal bleed requiring surgery.  The patient initially presented to [facility] 09/17/14 with severe chest pain. She was transferred to [facility] and had negative troponins. She underwent nuclear stress test that was felt to be high risk with decreased activity in the inferolateral wall suggesting ischemia. Cardiac catheterization was done and showed mild nonobstructive CAD with normal LAD, patent stent in the proximal circumflex, and dominant RCA with smooth 30% tubular narrowing in the midsegment and 30% distal narrowing prior to PDA takeoff. LV function was normal at cath EF 50%. Continued medical therapy and smoking cessation was strongly advised. He was sent home on aspirin, Plavix, metoprolol, Imdur and Lipitor.  When the patient went home initially she had trouble with constipation and took one stool softener. She now complains of 3 days of severe diarrhea that is watery and foul-smelling. She also has nausea and abdominal pain as well as dull scratching feeling in her chest. 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 She hasn't eaten out and doesn't think she had any tainted food. No one else in her family is sick. She is very weak and has lost 3 pounds. She has cut back on her smoking since she was placed on nicotine patch.    Past Medical History  Diagnosis Date  . Hypertension   . Stroke [facility])     tia with no  deficits  . CAD (coronary artery disease) 11/13/2011    50% ? proximal LCx stenosis per Cath at [facility]  . PVD (peripheral vascular disease) (HCC)     Right CFA stenosis per report on 11/14/2011  . HLD (hyperlipidemia)   . LBBB (left bundle branch block)   . Acute myocardial infarction of other lateral wall, initial episode of care 01/2013    DES CFX  . Intermediate coronary syndrome (HCC)   . Chest pain, atypical, muscular skelatal   01/09/2014    Past Surgical History  Procedure Laterality Date  . Cardiac catheterization  04/14/2013    Non-obstructive disease, patent CFX stent  . Tubal ligation    . Appendectomy    . Groin debridement Right 04/14/2013    Procedure: Emergency Evacuation of Retroperitoneal Hematoma and Repair Right External Iliac Artery Pseudoaneurysm    ;  Surgeon: Sherren Kerns, MD;  Location: [facility] OR;  Service: Vascular;  Laterality: Right;  . Coronary angioplasty with stent placement  01/17/2013    mild disease except 99% CFX, rx with  2.5 x 28 Alpine drug-eluting stent   . Left heart catheterization with coronary angiogram N/A 01/18/2013    Procedure: LEFT HEART CATHETERIZATION WITH CORONARY ANGIOGRAM;  Surgeon: Corky Crafts, MD;  Location: [facility] CATH LAB;  Service: Cardiovascular;  Laterality: N/A;  . Percutaneous coronary stent intervention (pci-s)  01/18/2013    Procedure: PERCUTANEOUS CORONARY STENT INTERVENTION (PCI-S);  Surgeon: Corky Crafts, MD;  Location: [facility] CATH LAB;  Service: Cardiovascular;;  .  Left heart catheterization with coronary angiogram N/A 04/14/2013    Procedure: LEFT HEART CATHETERIZATION WITH CORONARY ANGIOGRAM;  Surgeon: Corky Crafts, MD;  Location: [facility] CATH LAB;  Service: Cardiovascular;  Laterality: N/A;  . Tee without cardioversion N/A 03/06/2014    Procedure: TRANSESOPHAGEAL ECHOCARDIOGRAM (TEE);  Surgeon: Lars Masson, MD;  Location: [facility] ENDOSCOPY;  Service: Cardiovascular;  Laterality: N/A;  . Cardiac catheterization N/A  09/21/2014    Procedure: Left Heart Cath and Coronary Angiography;  Surgeon: Lennette Bihari, MD;  Location: [facility] INVASIVE CV LAB;  Service: Cardiovascular;  Laterality: N/A;     Current Outpatient Prescriptions  Medication Sig Dispense Refill  . acetaminophen (TYLENOL) 325 MG tablet Take 325 mg by mouth every 6 (six) hours as needed for moderate pain.    Marland Kitchen aspirin 81 MG tablet Take 1 tablet (81 mg total) by mouth daily. 30 tablet   . atorvastatin (LIPITOR) 80 MG tablet Take 1 tablet (80 mg total) by mouth daily. 30 tablet 5  . clopidogrel (PLAVIX) 75 MG tablet Take 1 tablet (75 mg total) by mouth daily with breakfast. 90 tablet 1  . docusate sodium (COLACE) 100 MG capsule Take 100 mg by mouth 2 (two) times daily as needed for mild constipation.    . furosemide (LASIX) 20 MG tablet Take 20 mg by mouth daily.    . isosorbide mononitrate (IMDUR) 30 MG 24 hr tablet Take 1 tablet (30 mg total) by mouth daily. 90 tablet 2  . metoprolol tartrate (LOPRESSOR) 25 MG tablet Take 0.5 tablets (12.5 mg total) by mouth 2 (two) times daily. 60 tablet 5  . nicotine (NICODERM CQ) 14 mg/24hr patch Place 1 patch (14 mg total) onto the skin daily. 28 patch 0  . nitroGLYCERIN (NITROSTAT) 0.4 MG SL tablet Place 1 tablet (0.4 mg total) under the tongue every 5 (five) minutes x 3 doses as needed for chest pain. 25 tablet 6  . potassium chloride (K-DUR) 10 MEQ tablet Take 1 tablet (10 mEq total) by mouth daily. 30 tablet 6  . sertraline (ZOLOFT) 50 MG tablet Take 1 tablet (50 mg total) by mouth daily. 30 tablet 3   No current facility-administered medications for this visit.    Allergies:   Brilinta; Other; and Penicillins    Social History:  The patient  reports that she has been smoking.  She has never used smokeless tobacco. She reports that she uses illicit drugs (Marijuana). She reports that she does not drink alcohol.   Family History:  The patient's   family history includes Bladder Cancer in her mother; CAD  (age of onset: 89) in her mother; CAD (age of onset: 59) in her father; Cancer in her mother; Heart attack in her father and mother; Heart disease in her father and mother; Hypertension in her mother; Lung cancer in her mother; Stroke in her father and mother.    ROS:  Please see the history of present illness.   Otherwise, review of systems are positive for excessive fatigue, dyspnea on exertion, some wheezing, nausea vomiting diarrhea, anxiety depression, easy bruising.   All other systems are reviewed and negative.    PHYSICAL EXAM: VS:  BP 140/82 mmHg  Pulse 54  Ht 5' 1.5" (1.562 m)  Wt 115 lb (52.164 kg)  BMI 21.38 kg/m2 , BMI Body mass index is 21.38 kg/(m^2). GEN: Well nourished, well developed, in no acute distress Neck: no JVD, HJR, carotid bruits, or masses Cardiac:  RRR; no murmurs,gallop, rubs, thrill or heave,  Respiratory:  clear to auscultation bilaterally, normal work of breathing GI: soft, nontender, nondistended, + BS MS: no deformity or atrophy Extremities: Left groin without hematoma or hemorrhage at cath site good femoral and distal pulses, otherwise lower extremities without cyanosis, clubbing, edema, good distal pulses bilaterally.  Skin: warm and dry, no rash Neuro:  Strength and sensation are intact    EKG:  EKG is ordered today. The ekg ordered today demonstrates sinus bradycardia 54 bpm with nonspecific intraventricular block   Recent Labs: 01/08/2014: B Natriuretic Peptide 1118.4* 05/28/2014: ALT 23 09/17/2014: TSH 4.039 09/18/2014: Hemoglobin 12.8; Platelets 173 09/19/2014: BUN 14; Creatinine, Ser 0.74; Magnesium 1.7; Potassium 3.5; Sodium 138    Lipid Panel    Component Value Date/Time   CHOL 185 09/18/2014 0500   TRIG 122 09/18/2014 0500   HDL 30* 09/18/2014 0500   CHOLHDL 6.2 09/18/2014 0500   VLDL 24 09/18/2014 0500   LDLCALC 131* 09/18/2014 0500      Wt Readings from Last 3 Encounters:  10/05/14 115 lb (52.164 kg)  09/24/14 119 lb (53.978  kg)  09/22/14 119 lb 9.6 oz (54.25 kg)      Other studies Reviewed: Additional studies/ records that were reviewed today include and review of the records demonstrates:  Significant Diagnostic Studies:  NST 09/19/14 FINDINGS: Perfusion: Decreased activity in inferolateral wall on the stress images suggesting ischemia. Breast attenuation of the low anterior wall is also noted.  Wall Motion: Left ventricular wall motion abnormality in the area of ischemia. Mild global hypokinesis. Ventricular dilatation.  Left Ventricular Ejection Fraction: 39 %  End diastolic volume 143 ml  End systolic volume 87 ml  IMPRESSION: 1. Moderate area of inferolateral ischemia.  2. Global hypokinesis and wall motion abnormality in the inferolateral region.  3. Left ventricular ejection fraction 39%  4. High-risk stress test findings*.  LHC 09/21/14 Conclusion       Mid RCA lesion, 30% stenosed.  Dist RCA lesion, 30% stenosed.  The left ventricular systolic function is normal.   Low-normal LV function with an ejection fraction of 50% and evidence for mild distal anterolateral focal hypocontractility.  Mild nonobstructive CAD with a normal LAD, widely patent stent in the proximal circumflex coronary artery , and dominant RCA with smooth 30% tubular narrowing in the mid segment and 30% distal narrowing prior to the PDA takeoff.  RECOMMENDATION: Medical therapy.  Smoking cessation is essential.        ASSESSMENT AND PLAN:  CAD - CFX DES 01/17/13 Patient had recent admission with abnormal nuclear stress test but cardiac catheterization showing mild nonobstructive CAD with widely patent circumflex stent. Please see above for details. LV function was normal. EF was 39% on stress test. She is being maintained on Plavix and aspirin. Chest pain is atypical and she has a lot of GI symptoms. Recommend follow-up with primary care for this. Follow-up with Dr.Varanasi in 2 months.  Diarrhea Patient  is having significant foul-smelling watery diarrhea for the past 3 days. She's also having a lot of nausea and indigestion. 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 I'm concerned the patient may have C. difficile. I recommend she follow-up with her primary care for further workup and treatment.  Tobacco abuse Patient has cut back on smoking since starting the nicotine patches. Hopefully she will be able to quit.  HTN (hypertension) Blood pressure is up a little today but the patient is very uncomfortable with her GI problems. Will not make any changes today.  Signed, Jacolyn Reedy, PA-C  10/05/2014 2:33 PM    [facility] [address] [place], [place], [place]  [address] Phone: [phone]; Fax: [phone]

## 2014-11-05 ENCOUNTER — Ambulatory Visit: Payer: BLUE CROSS/BLUE SHIELD | Admitting: Family Medicine

## 2014-11-09 ENCOUNTER — Other Ambulatory Visit: Payer: Self-pay

## 2014-11-09 MED ORDER — ATORVASTATIN CALCIUM 80 MG PO TABS: 80.0000 mg | ORAL_TABLET | Freq: Every day | ORAL | Status: DC

## 2014-11-09 MED ORDER — FUROSEMIDE 20 MG PO TABS: 20.0000 mg | ORAL_TABLET | Freq: Every day | ORAL | Status: DC

## 2014-11-09 MED ORDER — ISOSORBIDE MONONITRATE ER 30 MG PO TB24: 30.0000 mg | ORAL_TABLET | Freq: Every day | ORAL | Status: DC

## 2014-11-09 MED ORDER — POTASSIUM CHLORIDE ER 10 MEQ PO TBCR: 10.0000 meq | EXTENDED_RELEASE_TABLET | Freq: Every day | ORAL | Status: DC

## 2014-11-09 MED ORDER — METOPROLOL TARTRATE 25 MG PO TABS: 12.5000 mg | ORAL_TABLET | Freq: Two times a day (BID) | ORAL | Status: DC

## 2014-11-23 ENCOUNTER — Other Ambulatory Visit: Payer: Self-pay | Admitting: Interventional Cardiology

## 2014-11-23 MED ORDER — CLOPIDOGREL BISULFATE 75 MG PO TABS: 75.0000 mg | ORAL_TABLET | Freq: Every day | ORAL | Status: DC

## 2015-01-13 ENCOUNTER — Telehealth: Payer: Self-pay | Admitting: Interventional Cardiology

## 2015-01-13 NOTE — Telephone Encounter (Signed)
Spoke with patient who states she is coughing up white colored phlegm, worse when she tries to lay down.  She states her chest is sore from coughing.  I advised her to call her PCP and she states she no longer has one.  I encouraged her to go to the nearest Urgent Care for evaluation as her symptoms sound respiratory in nature.  I advised her that in terms of cold/cough medications, we advise she not take anything with decongestant.  She verbalized understanding and agreement.

## 2015-01-13 NOTE — Telephone Encounter (Signed)
New Message  Pt requested to speak w/ RN concerning a safe OTC couph med- pt c/o of extreme coughing for the last 2 weeks. Please call back and discuss.

## 2015-02-08 ENCOUNTER — Encounter: Payer: Self-pay | Admitting: Interventional Cardiology

## 2015-02-08 ENCOUNTER — Ambulatory Visit (INDEPENDENT_AMBULATORY_CARE_PROVIDER_SITE_OTHER): Payer: Self-pay | Admitting: Interventional Cardiology

## 2015-02-08 VITALS — BP 160/96 | HR 100 | Ht 61.0 in | Wt 123.0 lb

## 2015-02-08 DIAGNOSIS — I25118 Atherosclerotic heart disease of native coronary artery with other forms of angina pectoris: Secondary | ICD-10-CM

## 2015-02-08 DIAGNOSIS — Z720 Tobacco use: Secondary | ICD-10-CM

## 2015-02-08 DIAGNOSIS — I5032 Chronic diastolic (congestive) heart failure: Secondary | ICD-10-CM

## 2015-02-08 DIAGNOSIS — E785 Hyperlipidemia, unspecified: Secondary | ICD-10-CM

## 2015-02-08 DIAGNOSIS — I10 Essential (primary) hypertension: Secondary | ICD-10-CM

## 2015-02-08 LAB — COMPREHENSIVE METABOLIC PANEL
ALBUMIN: 3.9 g/dL (ref 3.6–5.1)
ALK PHOS: 79 U/L (ref 33–115)
ALT: 9 U/L (ref 6–29)
AST: 15 U/L (ref 10–35)
BILIRUBIN TOTAL: 0.4 mg/dL (ref 0.2–1.2)
BUN: 12 mg/dL (ref 7–25)
CO2: 26 mmol/L (ref 20–31)
CREATININE: 0.75 mg/dL (ref 0.50–1.10)
Calcium: 9 mg/dL (ref 8.6–10.2)
Chloride: 106 mmol/L (ref 98–110)
Glucose, Bld: 78 mg/dL (ref 65–99)
Potassium: 4 mmol/L (ref 3.5–5.3)
SODIUM: 140 mmol/L (ref 135–146)
TOTAL PROTEIN: 7.2 g/dL (ref 6.1–8.1)

## 2015-02-08 LAB — LIPID PANEL
CHOLESTEROL: 229 mg/dL — AB (ref 125–200)
HDL: 37 mg/dL — AB (ref >=46)
LDL Cholesterol: 159 mg/dL — ABNORMAL HIGH (ref <130)
TRIGLYCERIDES: 166 mg/dL — AB (ref <150)
Total CHOL/HDL Ratio: 6.2 Ratio — ABNORMAL HIGH (ref <=5.0)
VLDL: 33 mg/dL — ABNORMAL HIGH (ref <30)

## 2015-02-08 MED ORDER — FUROSEMIDE 20 MG PO TABS: 20.0000 mg | ORAL_TABLET | Freq: Every day | ORAL | Status: DC | PRN

## 2015-02-08 NOTE — Patient Instructions (Addendum)
Medication Instructions:  Your physician has recommended you make the following change in your medication: . 1.  START Lasix 20 mg taking 1 tablet daily AS NEEDED FOR SWELLING    Labwork: TODAY:  CMET & LIPID PANEL  Testing/Procedures: NONE ORDERED  Follow-Up: Your physician wants you to follow-up in: 6 MONTHS WITH DR. VARANASI.  You will receive a reminder letter in the mail two months in advance. If you don't receive a letter, please call our office to schedule the follow-up appointment.   Any Other Special Instructions Will Be Listed Below (If Applicable).     If you need a refill on your cardiac medications before your next appointment, please call your pharmacy.

## 2015-02-08 NOTE — Progress Notes (Signed)
Patient ID: Lindsay Camacho, female   DOB: [birth year], 50 y.o.   MRN: [id number]     Cardiology Office Note   Date:  02/08/2015   ID:  Lindsay Camacho, DOB 1965/08/18, MRN [id number]  PCP:  Massie Maroon, FNP    No chief complaint on file. f/u CAD   Wt Readings from Last 3 Encounters:  02/08/15 123 lb (55.792 kg)  10/05/14 115 lb (52.164 kg)  09/24/14 119 lb (53.978 kg)       History of Present Illness: Lindsay Camacho is a 50 y.o. female  Who has had a lateral wall MI in 2015.  She had recurrent chest pain in 2016 and had a repeat cath in 9/16 showing a patent stent.  She went to [place] [facility] [place] [facility] and had a nother cath a few weeks ago showing a similar result per her report.  He rfluid pill was stopped and she feels that she has more trouble breathing.  She continues to smoke on occasion before putting on her nicotine.    She reports SHOB improved with Lasix.  Hand swelling intermittently better with Lasix.      Past Medical History  Diagnosis Date  . Hypertension   . Stroke [facility])     tia with no deficits  . CAD (coronary artery disease) 11/13/2011    50% ? proximal LCx stenosis per Cath at [facility]  . PVD (peripheral vascular disease) (HCC)     Right CFA stenosis per report on 11/14/2011  . HLD (hyperlipidemia)   . LBBB (left bundle branch block)   . Acute myocardial infarction of other lateral wall, initial episode of care 01/2013    DES CFX  . Intermediate coronary syndrome (HCC)   . Chest pain, atypical, muscular skelatal   01/09/2014    Past Surgical History  Procedure Laterality Date  . Cardiac catheterization  04/14/2013    Non-obstructive disease, patent CFX stent  . Tubal ligation    . Appendectomy    . Groin debridement Right 04/14/2013    Procedure: Emergency Evacuation of Retroperitoneal Hematoma and Repair Right External Iliac Artery Pseudoaneurysm    ;  Surgeon: Sherren Kerns, MD;  Location: [facility] OR;  Service: Vascular;  Laterality: Right;  . Coronary  angioplasty with stent placement  01/17/2013    mild disease except 99% CFX, rx with  2.5 x 28 Alpine drug-eluting stent   . Left heart catheterization with coronary angiogram N/A 01/18/2013    Procedure: LEFT HEART CATHETERIZATION WITH CORONARY ANGIOGRAM;  Surgeon: Corky Crafts, MD;  Location: [facility] CATH LAB;  Service: Cardiovascular;  Laterality: N/A;  . Percutaneous coronary stent intervention (pci-s)  01/18/2013    Procedure: PERCUTANEOUS CORONARY STENT INTERVENTION (PCI-S);  Surgeon: Corky Crafts, MD;  Location: [facility] CATH LAB;  Service: Cardiovascular;;  . Left heart catheterization with coronary angiogram N/A 04/14/2013    Procedure: LEFT HEART CATHETERIZATION WITH CORONARY ANGIOGRAM;  Surgeon: Corky Crafts, MD;  Location: [facility] CATH LAB;  Service: Cardiovascular;  Laterality: N/A;  . Tee without cardioversion N/A 03/06/2014    Procedure: TRANSESOPHAGEAL ECHOCARDIOGRAM (TEE);  Surgeon: Lars Masson, MD;  Location: [facility] ENDOSCOPY;  Service: Cardiovascular;  Laterality: N/A;  . Cardiac catheterization N/A 09/21/2014    Procedure: Left Heart Cath and Coronary Angiography;  Surgeon: Lennette Bihari, MD;  Location: [facility] INVASIVE CV LAB;  Service: Cardiovascular;  Laterality: N/A;     Current Outpatient Prescriptions  Medication Sig Dispense Refill  . acetaminophen (TYLENOL) 325  MG tablet Take 325 mg by mouth every 6 (six) hours as needed for moderate pain.    Marland Kitchen aspirin 81 MG tablet Take 1 tablet (81 mg total) by mouth daily. 30 tablet   . atorvastatin (LIPITOR) 80 MG tablet Take 1 tablet (80 mg total) by mouth daily. 30 tablet 5  . clopidogrel (PLAVIX) 75 MG tablet Take 1 tablet (75 mg total) by mouth daily with breakfast. 90 tablet 3  . docusate sodium (COLACE) 100 MG capsule Take 100 mg by mouth 2 (two) times daily as needed for mild constipation.    . isosorbide mononitrate (IMDUR) 30 MG 24 hr tablet Take 1 tablet (30 mg total) by mouth daily. 90 tablet 2  . nicotine (NICODERM CQ)  14 mg/24hr patch Place 1 patch (14 mg total) onto the skin daily. 28 patch 0  . nitroGLYCERIN (NITROSTAT) 0.4 MG SL tablet Place 1 tablet (0.4 mg total) under the tongue every 5 (five) minutes x 3 doses as needed for chest pain. 25 tablet 6  . potassium chloride (K-DUR) 10 MEQ tablet Take 1 tablet (10 mEq total) by mouth daily. 30 tablet 6   No current facility-administered medications for this visit.    Allergies:   Brilinta; Other; and Penicillins    Social History:  The patient  reports that she has been smoking.  She has never used smokeless tobacco. She reports that she uses illicit drugs (Marijuana). She reports that she does not drink alcohol.   Family History:  The patient's family history includes Bladder Cancer in her mother; CAD (age of onset: 52) in her mother; CAD (age of onset: 51) in her father; Cancer in her mother; Heart attack in her father and mother; Heart disease in her father and mother; Hypertension in her mother; Lung cancer in her mother; Stroke in her father and mother.    ROS:  Please see the history of present illness.   Otherwise, review of systems are positive for [facility], chest pain.   All other systems are reviewed and negative.    PHYSICAL EXAM: VS:  BP 160/96 mmHg  Pulse 100  Ht  (1.549 m)  Wt 123 lb (55.792 kg)  BMI 23.25 kg/m2 , BMI Body mass index is 23.25 kg/(m^2). GEN: Well nourished, well developed, in no acute distress HEENT: normal Neck: no JVD, carotid bruits, or masses Cardiac: RRR; no murmurs, rubs, or gallops,no edema  Respiratory:  clear to auscultation bilaterally, normal work of breathing GI: soft, nontender, nondistended, + BS MS: no deformity or atrophy Skin: warm and dry, no rash Neuro:  Strength and sensation are intact Psych: euthymic mood, full affect    Recent Labs: 05/28/2014: ALT 23 09/17/2014: TSH 4.039 09/18/2014: Hemoglobin 12.8; Platelets 173 09/19/2014: BUN 14; Creatinine, Ser 0.74; Magnesium 1.7; Potassium 3.5;  Sodium 138   Lipid Panel    Component Value Date/Time   CHOL 185 09/18/2014 0500   TRIG 122 09/18/2014 0500   HDL 30* 09/18/2014 0500   CHOLHDL 6.2 09/18/2014 0500   VLDL 24 09/18/2014 0500   LDLCALC 131* 09/18/2014 0500     Other studies Reviewed: Additional studies/ records that were reviewed today with results demonstrating: .   ASSESSMENT AND PLAN:  1. CAD/old MI/chronic diastolic heart failure: She reports mild fluid overload.  EF normal in the past. Will prescribe Furosemide 20 mg daily prn fluid overload.  I don't think she will need this every day.  2. Tobacco abuse: Continue to try to wean herself using patches.  3. Hyperlipidemia:  Continue atorvastatin.  Recheck lipids to see if there has been improvement. Check labs today.   4. HTN: Elevated BP today.  Need to get records from [place] as to what they did there with the meds.  SHe may need some BP lowering meds in the future.    Current medicines are reviewed at length with the patient today.  The patient concerns regarding her medicines were addressed.  The following changes have been made:  No change  Labs/ tests ordered today include:  No orders of the defined types were placed in this encounter.    Recommend 150 minutes/week of aerobic exercise Low fat, low carb, high fiber diet recommended  Disposition:   FU in 6 months   Delorise Jackson., MD  02/08/2015 11:54 AM    [facility] [address] [place], [place], [place]  [address] Phone: [phone]; Fax: [phone]

## 2015-02-10 ENCOUNTER — Other Ambulatory Visit: Payer: Self-pay

## 2015-02-10 DIAGNOSIS — E785 Hyperlipidemia, unspecified: Secondary | ICD-10-CM

## 2015-04-06 ENCOUNTER — Other Ambulatory Visit: Payer: Self-pay

## 2015-04-09 ENCOUNTER — Other Ambulatory Visit: Payer: Self-pay

## 2015-10-12 ENCOUNTER — Encounter: Payer: Self-pay | Admitting: Cardiology

## 2015-10-19 ENCOUNTER — Ambulatory Visit: Payer: Self-pay | Admitting: Cardiology

## 2015-10-20 ENCOUNTER — Encounter: Payer: Self-pay | Admitting: Cardiology

## 2015-10-27 ENCOUNTER — Encounter (INDEPENDENT_AMBULATORY_CARE_PROVIDER_SITE_OTHER): Payer: Self-pay

## 2015-10-27 ENCOUNTER — Encounter: Payer: Self-pay | Admitting: Cardiology

## 2015-10-27 ENCOUNTER — Ambulatory Visit (INDEPENDENT_AMBULATORY_CARE_PROVIDER_SITE_OTHER): Payer: Self-pay | Admitting: Cardiology

## 2015-10-27 VITALS — BP 112/72 | HR 74 | Ht 61.0 in | Wt 121.0 lb

## 2015-10-27 DIAGNOSIS — I5020 Unspecified systolic (congestive) heart failure: Secondary | ICD-10-CM

## 2015-10-27 DIAGNOSIS — Z79899 Other long term (current) drug therapy: Secondary | ICD-10-CM

## 2015-10-27 DIAGNOSIS — I5032 Chronic diastolic (congestive) heart failure: Secondary | ICD-10-CM

## 2015-10-27 LAB — BASIC METABOLIC PANEL
BUN: 14 mg/dL (ref 7–25)
CALCIUM: 8.9 mg/dL (ref 8.6–10.4)
CO2: 28 mmol/L (ref 20–31)
Chloride: 106 mmol/L (ref 98–110)
Creat: 0.85 mg/dL (ref 0.50–1.05)
GLUCOSE: 106 mg/dL — AB (ref 65–99)
Potassium: 3.7 mmol/L (ref 3.5–5.3)
SODIUM: 142 mmol/L (ref 135–146)

## 2015-10-27 NOTE — Patient Instructions (Addendum)
Medication Instructions:  Your physician recommends that you continue on your current medications as directed. Please refer to the Current Medication list given to you today.  Labwork: TODAY:  BMET  Testing/Procedures: Your physician has requested that you have en exercise stress myoview. For further information please visit [email or web address] Please follow instruction sheet, as given.    Follow-Up: Your physician recommends that you schedule a follow-up appointment in: 2-3 WEEKS WITH DR. VARANASI OR BRITTANY SIMMONS, PA-C   Any Other Special Instructions Will Be Listed Below (If Applicable).  Exercise Stress Electrocardiogram An exercise stress electrocardiogram is a test that is done to evaluate the blood supply to your heart. This test may also be called exercise stress electrocardiography. The test is done while you are walking on a treadmill. The goal of this test is to raise your heart rate. This test is done to find areas of poor blood flow to the heart by determining the extent of coronary artery disease (CAD).   CAD is defined as narrowing in one or more heart (coronary) arteries of more than 70%. If you have an abnormal test result, this may mean that you are not getting adequate blood flow to your heart during exercise. Additional testing may be needed to understand why your test was abnormal. LET [facility] CARE PROVIDER KNOW ABOUT:   Any allergies you have.  All medicines you are taking, including vitamins, herbs, eye drops, creams, and over-the-counter medicines.  Previous problems you or members of your family have had with the use of anesthetics.  Any blood disorders you have.  Previous surgeries you have had.  Medical conditions you have.  Possibility of pregnancy, if this applies. RISKS AND COMPLICATIONS Generally, this is a safe procedure. However, as with any procedure, complications can occur. Possible complications can include:  Pain or pressure in the  following areas:  Chest.  Jaw or neck.  Between your shoulder blades.  Radiating down your left arm.  Dizziness or light-headedness.  Shortness of breath.  Increased or irregular heartbeats.  Nausea or vomiting.  Heart attack (rare). BEFORE THE PROCEDURE  Avoid all forms of caffeine 24 hours before your test or as directed by your health care provider. This includes coffee, tea (even decaffeinated tea), caffeinated sodas, chocolate, cocoa, and certain pain medicines.  Follow your health care provider's instructions regarding eating and drinking before the test.  Take your medicines as directed at regular times with water unless instructed otherwise. Exceptions may include:  If you have diabetes, ask how you are to take your insulin or pills. It is common to adjust insulin dosing the morning of the test.  If you are taking beta-blocker medicines, it is important to talk to your health care provider about these medicines well before the date of your test. Taking beta-blocker medicines may interfere with the test. In some cases, these medicines need to be changed or stopped 24 hours or more before the test.  If you wear a nitroglycerin patch, it may need to be removed prior to the test. Ask your health care provider if the patch should be removed before the test.  If you use an inhaler for any breathing condition, bring it with you to the test.  If you are an outpatient, bring a snack so you can eat right after the stress phase of the test.  Do not smoke for 4 hours prior to the test or as directed by your health care provider.  Do not apply lotions, powders, creams, or  oils on your chest prior to the test.  Wear loose-fitting clothes and comfortable shoes for the test. This test involves walking on a treadmill. PROCEDURE  Multiple patches (electrodes) will be put on your chest. If needed, small areas of your chest may have to be shaved to get better contact with the  electrodes. Once the electrodes are attached to your body, multiple wires will be attached to the electrodes and your heart rate will be monitored.  Your heart will be monitored both at rest and while exercising.  You will walk on a treadmill. The treadmill will be started at a slow pace. The treadmill speed and incline will gradually be increased to raise your heart rate. AFTER THE PROCEDURE  Your heart rate and blood pressure will be monitored after the test.  You may return to your normal schedule including diet, activities, and medicines, unless your health care provider tells you otherwise.   This information is not intended to replace advice given to you by your health care provider. Make sure you discuss any questions you have with your health care provider.   Document Released: 12/17/1999 Document Revised: 12/24/2012 Document Reviewed: 08/26/2012 Elsevier Interactive Patient Education [employer]2016 [employer]vier Inc.     If you need a refill on your cardiac medications before your next appointment, please call your pharmacy.

## 2015-10-27 NOTE — Progress Notes (Signed)
10/28/2015 Lindsay Camacho   15-Jul-1965  [id number]  Primary Physician Pcp Not In System Primary Cardiologist: Eldridge Dace    Reason for Visit/CC: [facility] F/u for Acute Systolic CHF  HPI:  The patient is a 50 y/o female, followed by Dr. Abe People, with a h/o CAD s/p lateral wall MI in 2015 s/p PCI to proximal LCx. She had recurrent chest pain in 2016 and had a repeat cath in 9/16 showing a patent stent. She went to [place], [place] hospital in January 2017, and underwent repeat LHC. This showed stable disease, patent LCx stent with 25% LAD disease. She was seen by Dr. Eldridge Dace, following that admission in [place]. She was felt to be fairly stable. She also has a h/o chronic diastolic HF and moderate MR seen on echo Jan 2017. She has a long tobacco use history.   She presents back to clinic for f/u. She had another admission at [place], [place], 10/12/15. She presented with signs and symptoms of congestive heart failure. Per records from the Hospital in [place], her BNP was elevated at 2000. She was volume overloaded. She was treated with IV Lasix. Cardiac enzymes were cycled and were negative x 3. No ischemic workup was conducted. However she did have a 2-D echocardiogram which revealed reduction in her EF down to 40-45%. She was started on a beta blocker and ACE inhibitor. She was also instructed to start taking Lasix daily.   She presents to clinic for post hospital follow-up. Her breathing has improved. She has had 2 episodes of substernal chest discomfort since discharge that was relieved with nitroglycerin. She is currently chest pain-free. She admits that she has not been fully compliant with daily weights but she has been adherent to a low-sodium diet. Her EKG demonstrates chronic left bundle branch block. She reports that she quit smoking following her hospitalization.   Current Meds  Medication Sig  . acetaminophen (TYLENOL) 325 MG tablet Take 325 mg by mouth every 6 (six) hours as needed for  moderate pain.  Marland Kitchen aspirin 81 MG tablet Take 1 tablet (81 mg total) by mouth daily.  Marland Kitchen atorvastatin (LIPITOR) 80 MG tablet Take 1 tablet (80 mg total) by mouth daily.  . carvedilol (COREG) 6.25 MG tablet Take 6.25 mg by mouth 2 (two) times daily with a meal.  . clopidogrel (PLAVIX) 75 MG tablet Take 1 tablet (75 mg total) by mouth daily with breakfast.  . docusate sodium (COLACE) 100 MG capsule Take 100 mg by mouth 2 (two) times daily as needed for mild constipation.  . furosemide (LASIX) 20 MG tablet Take 1 tablet (20 mg total) by mouth daily as needed for fluid.  . isosorbide mononitrate (IMDUR) 30 MG 24 hr tablet Take 1 tablet (30 mg total) by mouth daily.  Marland Kitchen lisinopril (PRINIVIL,ZESTRIL) 10 MG tablet Take 10 mg by mouth daily.  . nicotine (NICODERM CQ) 14 mg/24hr patch Place 1 patch (14 mg total) onto the skin daily.  . nitroGLYCERIN (NITROSTAT) 0.4 MG SL tablet Place 1 tablet (0.4 mg total) under the tongue every 5 (five) minutes x 3 doses as needed for chest pain.  . potassium chloride (K-DUR) 10 MEQ tablet Take 1 tablet (10 mEq total) by mouth daily.   Allergies  Allergen Reactions  . Brilinta [Ticagrelor] Other (See Comments)    Dyspnea  . Other Other (See Comments)    BP med (unsure of name) BP bottomed out  . Dye Fdc Red [Red Dye] Other (See Comments)    Allergic  to dye that is used in nuclear stress test. Pt sates makes her feel like her blood is boiling and itching  . Penicillins Rash   Past Medical History:  Diagnosis Date  . Acute myocardial infarction of other lateral wall, initial episode of care 01/2013   DES CFX  . CAD (coronary artery disease) 11/13/2011   50% ? [facility]  . Chest pain, atypical, muscular skelatal   01/09/2014  . HLD (hyperlipidemia)   . Hypertension   . Intermediate coronary syndrome (HCC)   . LBBB (left bundle branch block)   . PVD (peripheral vascular disease) (HCC)    Right CFA stenosis per report on 11/14/2011  .  Stroke [facility](HCC)    tia with no deficits   Family History  Problem Relation Age of Onset  . CAD Mother 1954  . Lung cancer Mother   . Bladder Cancer Mother   . Stroke Mother   . Cancer Mother     Lung and Bladder  . Heart disease Mother     Before age 50  . Hypertension Mother   . Heart attack Mother   . CAD Father [id number]  . Heart disease Father     Before age 50  . Heart attack Father   . Stroke Father     Bleeding stroke   Past Surgical History:  Procedure Laterality Date  . APPENDECTOMY    . CARDIAC CATHETERIZATION  04/14/2013   Non-obstructive disease, patent CFX stent  . CARDIAC CATHETERIZATION N/A 09/21/2014   Procedure: Left Heart Cath and Coronary Angiography;  Surgeon: Lennette Biharihomas A Kelly, MD;  [facility];  Service: Cardiovascular;  Laterality: N/A;  . CORONARY ANGIOPLASTY WITH STENT PLACEMENT  01/17/2013   mild disease except 99% CFX, rx with  2.5 x 28 Alpine drug-eluting stent   . GROIN DEBRIDEMENT Right 04/14/2013   Procedure: Emergency Evacuation of Retroperitoneal Hematoma and Repair Right External Iliac Artery Pseudoaneurysm    ;  Surgeon: Sherren Kernsharles E Fields, MD;  [facility];  Service: Vascular;  Laterality: Right;  . LEFT HEART CATHETERIZATION WITH CORONARY ANGIOGRAM N/A 01/18/2013   Procedure: LEFT HEART CATHETERIZATION WITH CORONARY ANGIOGRAM;  Surgeon: Corky CraftsJayadeep S Varanasi, MD;  [facility];  Service: Cardiovascular;  Laterality: N/A;  . LEFT HEART CATHETERIZATION WITH CORONARY ANGIOGRAM N/A 04/14/2013   Procedure: LEFT HEART CATHETERIZATION WITH CORONARY ANGIOGRAM;  Surgeon: Corky CraftsJayadeep S Varanasi, MD;  [facility];  Service: Cardiovascular;  Laterality: N/A;  . PERCUTANEOUS CORONARY STENT INTERVENTION (PCI-S)  01/18/2013   Procedure: PERCUTANEOUS CORONARY STENT INTERVENTION (PCI-S);  Surgeon: Corky CraftsJayadeep S Varanasi, MD;  [facility];  Service: Cardiovascular;;  . TEE WITHOUT CARDIOVERSION N/A 03/06/2014   Procedure: TRANSESOPHAGEAL  ECHOCARDIOGRAM (TEE);  Surgeon: Lars MassonKatarina H Nelson, MD;  [facility];  Service: Cardiovascular;  Laterality: N/A;  . TUBAL LIGATION     Social History   Social History  . Marital status: Single    Spouse name: N/A  . Number of children: N/A  . Years of education: N/A   Occupational History  . Not on file.   Social History Main Topics  . Smoking status: Current Some Day Smoker    Packs/day: 0.50  . Smokeless tobacco: Never Used  . Alcohol use No  . Drug use:     Types: Marijuana  . Sexual activity: Yes    Birth control/ protection: None   Other Topics Concern  . Not on file  Social History Narrative  . No narrative on file     Review of Systems: General: negative for chills, fever, night sweats or weight changes.  Cardiovascular: negative for chest pain, dyspnea on exertion, edema, orthopnea, palpitations, paroxysmal nocturnal dyspnea or shortness of breath Dermatological: negative for rash Respiratory: negative for cough or wheezing Urologic: negative for hematuria Abdominal: negative for nausea, vomiting, diarrhea, bright red blood per rectum, melena, or hematemesis Neurologic: negative for visual changes, syncope, or dizziness All other systems reviewed and are otherwise negative except as noted above.   Physical Exam:  Blood pressure 112/72, pulse 74, height 5\' 1"  (1.549 m), weight 121 lb (54.9 kg), SpO2 98 %.  General appearance: alert, cooperative and no distress Neck: no carotid bruit and no JVD Lungs: clear to auscultation bilaterally Heart: regular rate and rhythm, S1, S2 normal, no murmur, click, rub or gallop Extremities: extremities normal, atraumatic, no cyanosis or edema Pulses: 2+ and symmetric Skin: Skin color, texture, turgor normal. No rashes or lesions Neurologic: Grossly normal  EKG Left bundle branch block  ASSESSMENT AND PLAN:   50 y/o female, followed by Dr. Abe People, with a h/o CAD s/p lateral wall MI in 2015 s/p PCI to  proximal LCx, long h/o tobacco use, HTN and HLD presenting as a post hospital f/u after admission in  for acute CHF, found to have new change in LVF with reduction in EF down to 40-45%, previously normal at 55%.   Given her unexplained reduction in systolic function as well as history of CAD and tobacco use, we will obtain a nuclear stress test to rule out ischemia. She has recently discontinued smoking. She was congratulated on her efforts. Continue medical therapy for heart failure including ACE inhibitor and beta blocker and diuretic. Given that she recently started on an ACE inhibitor and has increased her diuretic to daily use, we will check a BMP today to ensure that her renal function and potassium levels are stable. We also discussed the importance of adhering to a low-sodium diet and daily weights.  PLAN  F/u in 2 weeks after stress test.   Robbie Lis PA-C 10/28/2015 8:25 AM

## 2015-11-08 ENCOUNTER — Telehealth (HOSPITAL_COMMUNITY): Payer: Self-pay | Admitting: *Deleted

## 2015-11-08 NOTE — Telephone Encounter (Signed)
Left message on voicemail per DPR in reference to upcoming appointment scheduled 11/10/15 with detailed instructions given per Myocardial Perfusion Study Information Sheet for the test. LM to arrive 15 minutes early, and that it is imperative to arrive on time for appointment to keep from having the test rescheduled. If you need to cancel or reschedule your appointment, please call the office within 24 hours of your appointment. Failure to do so may result in a cancellation of your appointment, and a $50 no show fee. Phone number given for call back for any questions.  Antionette Char, RN

## 2015-11-10 ENCOUNTER — Ambulatory Visit (HOSPITAL_COMMUNITY): Payer: Self-pay | Attending: Cardiology

## 2015-11-10 DIAGNOSIS — I5020 Unspecified systolic (congestive) heart failure: Secondary | ICD-10-CM

## 2015-11-10 DIAGNOSIS — R0602 Shortness of breath: Secondary | ICD-10-CM | POA: Insufficient documentation

## 2015-11-10 DIAGNOSIS — R079 Chest pain, unspecified: Secondary | ICD-10-CM | POA: Insufficient documentation

## 2015-11-10 DIAGNOSIS — E785 Hyperlipidemia, unspecified: Secondary | ICD-10-CM | POA: Insufficient documentation

## 2015-11-10 DIAGNOSIS — I447 Left bundle-branch block, unspecified: Secondary | ICD-10-CM | POA: Insufficient documentation

## 2015-11-10 DIAGNOSIS — I10 Essential (primary) hypertension: Secondary | ICD-10-CM | POA: Insufficient documentation

## 2015-11-10 DIAGNOSIS — F17210 Nicotine dependence, cigarettes, uncomplicated: Secondary | ICD-10-CM | POA: Insufficient documentation

## 2015-11-10 DIAGNOSIS — I5032 Chronic diastolic (congestive) heart failure: Secondary | ICD-10-CM

## 2015-11-10 LAB — MYOCARDIAL PERFUSION IMAGING
CHL CUP NUCLEAR SDS: 2
CHL CUP RESTING HR STRESS: 80 {beats}/min
LHR: 0.27
LV dias vol: 155 mL (ref 46–106)
LVSYSVOL: 110 mL
Peak HR: 93 {beats}/min
SRS: 0
SSS: 2
TID: 1.12

## 2015-11-10 MED ORDER — ADENOSINE 12 MG/4ML IV SOLN
140.0000 ug/kg | Freq: Once | INTRAVENOUS | Status: AC
  Administered 2015-11-10: 7800 ug via INTRAVENOUS

## 2015-11-10 MED ORDER — TECHNETIUM TC 99M TETROFOSMIN IV KIT
30.0000 | PACK | Freq: Once | INTRAVENOUS | Status: AC | PRN
  Administered 2015-11-10: 30 via INTRAVENOUS
  Filled 2015-11-10: qty 30

## 2015-11-10 MED ORDER — TECHNETIUM TC 99M TETROFOSMIN IV KIT
9.4000 | PACK | Freq: Once | INTRAVENOUS | Status: AC | PRN
  Administered 2015-11-10: 9.4 via INTRAVENOUS
  Filled 2015-11-10: qty 10

## 2015-11-10 NOTE — Progress Notes (Unsigned)
Patient stated she had a severe reaction to Lexiscan in the past, and did not want to get Lexiscan today.  Patient ordered Lexiscan today. Spoke to Dr. Anne Fu- he agreed to proceed with [place]enoscan.  Ricky Ala

## 2015-11-11 ENCOUNTER — Telehealth: Payer: Self-pay | Admitting: Interventional Cardiology

## 2015-11-11 NOTE — Telephone Encounter (Signed)
PT AWARE OF MYOVIEW RESULTS./CY 

## 2015-11-11 NOTE — Telephone Encounter (Signed)
Follow Up:; ° ° °Returning your call. °

## 2015-11-12 ENCOUNTER — Telehealth: Payer: Self-pay | Admitting: Interventional Cardiology

## 2015-11-12 NOTE — Telephone Encounter (Signed)
Attempted to call pt back x 2.  Phone never rang but timer came on as if someone picked up and then call dropped after a minute. No VM option picked up.  Will try again later.

## 2015-11-12 NOTE — Telephone Encounter (Signed)
New Message  Pt voiced wanting nurse to call her back.  Pt voiced no CP no SOB.  Please f/u

## 2015-11-15 NOTE — Telephone Encounter (Signed)
Pt c/o low blood pressure and dizziness with position change (sitting/laying to standing). She states symptoms started this past Thursday.  Vitals reported as follows:  11/11/15 75/48 84 Sitting 11/12/15 97/62 80 Sitting 11/12/15 71/50 79 Sitting 11/13/15 90/63 108 Sitting 11/13/15 95/60 79 Sitting 11/14/15 88/59 73 Sitting 11/14/15 98/61 77 Sitting 11/15/15 112/71 82 sitting  Pt reports all BP's taken between 1500-1600.  She reports her normal BPs are 120s/80s.  I reviewed all medication from list with patient. She states she is taking furosemide 20 mg QD instead of PRN, denies nicotine patch since hospitalization, and denies tobacco use in the past two weeks.  Patient has f/u sch 11/17 with Boyce Medici, PA-C.  I advised her I would send her message to provider for further recommendation. She voiced understanding and thanks.

## 2015-11-16 NOTE — Telephone Encounter (Signed)
The pt is advised and she verbalized understanding and is in agreement with plan. 

## 2015-11-16 NOTE — Telephone Encounter (Signed)
Stay well hydrated.  Avoid caffeine and alcohol intake.

## 2015-11-19 ENCOUNTER — Ambulatory Visit (INDEPENDENT_AMBULATORY_CARE_PROVIDER_SITE_OTHER): Payer: Self-pay | Admitting: Cardiology

## 2015-11-19 ENCOUNTER — Encounter: Payer: Self-pay | Admitting: Cardiology

## 2015-11-19 VITALS — BP 124/4 | HR 72 | Ht 61.0 in | Wt 121.8 lb

## 2015-11-19 DIAGNOSIS — I5022 Chronic systolic (congestive) heart failure: Secondary | ICD-10-CM

## 2015-11-19 NOTE — Progress Notes (Signed)
11/19/2015 Lindsay Camacho   04-07-1965  [id number]  Primary Physician Pcp Not In System Primary Cardiologist: Dr. Eldridge DaceVaranasi    Reason for Visit/CC: f/u for systolic HF  HPI:  The patient is a 50 y/o female, followed by Dr. Abe PeopleVaransi, with a h/o CAD s/p lateral wall MI in 2015 s/p PCI to proximal LCx. She had recurrent chest pain in 2016 and had a repeat cath in 9/16 showing a patent stent. She went to [place] [place] [facility] in January 2017, and underwent repeat LHC. This showed stable disease, patent LCx stent with 25% LAD disease. She was seen by Dr. Eldridge DaceVaranasi, following that admission in [place]. She was felt to be fairly stable. She also has a h/o chronic diastolic HF and moderate MR seen on echo Jan 2017. She has a long tobacco use history.   She presents back to clinic for f/u. She had another admission at [place] [place], 10/12/15. She presented with signs and symptoms of congestive heart failure. Per records from the Hospital in [place], her BNP was elevated at 2000. She was volume overloaded. She was treated with IV Lasix. Cardiac enzymes were cycled and were negative x 3. No ischemic workup was conducted. However she did have a 2-D echocardiogram which revealed reduction in her EF down to 40-45%. She was started on a beta blocker and ACE inhibitor. She was also instructed to start taking Lasix daily.   I saw her for post hospital f/u on 10/27/15. Her breathing had improved but she did note 2 episodes of substernal chest discomfort relieved with SL NTG. Given her history, symptoms and recent drop in LVEF, I elected to order a NST. This was performed on 11/10/15. No ischemia was identified. This was however considered an abnormal study based on reduced systolic function.   She presents back to clinic today for f/u. She has done well. No recurrent CP. No resting dyspnea but still has mild exertional dyspnea with certain household chores. No LEE, orhtopnea or PND. No weight gain. She notes weight loss and  occasional low BP, that will sometimes drop into the 70s systolic. No syncope/ near syncope. She takes her meds with food. Staying well hydrated. Avoiding salt. BP is 124/84 in clinic today.   BP is 124/84. HR is   Current Meds  Medication Sig  . acetaminophen (TYLENOL) 325 MG tablet Take 325 mg by mouth every 6 (six) hours as needed for moderate pain.  Marland Kitchen. aspirin 81 MG tablet Take 1 tablet (81 mg total) by mouth daily.  Marland Kitchen. atorvastatin (LIPITOR) 80 MG tablet Take 1 tablet (80 mg total) by mouth daily.  . carvedilol (COREG) 6.25 MG tablet Take 6.25 mg by mouth 2 (two) times daily with a meal.  . clopidogrel (PLAVIX) 75 MG tablet Take 1 tablet (75 mg total) by mouth daily with breakfast.  . docusate sodium (COLACE) 100 MG capsule Take 100 mg by mouth 2 (two) times daily as needed for mild constipation.  . furosemide (LASIX) 20 MG tablet Take 1 tablet (20 mg total) by mouth daily as needed for fluid.  . isosorbide mononitrate (IMDUR) 30 MG 24 hr tablet Take 1 tablet (30 mg total) by mouth daily.  Marland Kitchen. lisinopril (PRINIVIL,ZESTRIL) 10 MG tablet Take 10 mg by mouth daily.  . nitroGLYCERIN (NITROSTAT) 0.4 MG SL tablet Place 1 tablet (0.4 mg total) under the tongue every 5 (five) minutes x 3 doses as needed for chest pain.  . potassium chloride (K-DUR) 10 MEQ tablet Take 1 tablet (  10 mEq total) by mouth daily.   Allergies  Allergen Reactions  . Brilinta [Ticagrelor] Other (See Comments)    Dyspnea  . Other Other (See Comments)    BP med (unsure of name) BP bottomed out  . Dye Fdc Red [Red Dye] Other (See Comments)    Allergic to dye that is used in nuclear stress test. Pt sates makes her feel like her blood is boiling and itching  . Penicillins Rash   Past Medical History:  Diagnosis Date  . Acute myocardial infarction of other lateral wall, initial episode of care 01/2013   DES CFX  . CAD (coronary artery disease) 11/13/2011   50% ? proximal LCx stenosis per Cath at [facility]  . Chest pain,  atypical, muscular skelatal   01/09/2014  . HLD (hyperlipidemia)   . Hypertension   . Intermediate coronary syndrome (HCC)   . LBBB (left bundle branch block)   . PVD (peripheral vascular disease) (HCC)    Right CFA stenosis per report on 11/14/2011  . Stroke [facility])    tia with no deficits   Family History  Problem Relation Age of Onset  . CAD Mother 24  . Lung cancer Mother   . Bladder Cancer Mother   . Stroke Mother   . Cancer Mother     Lung and Bladder  . Heart disease Mother     Before age 92  . Hypertension Mother   . Heart attack Mother   . CAD Father 91  . Heart disease Father     Before age 37  . Heart attack Father   . Stroke Father     Bleeding stroke   Past Surgical History:  Procedure Laterality Date  . APPENDECTOMY    . CARDIAC CATHETERIZATION  04/14/2013   Non-obstructive disease, patent CFX stent  . CARDIAC CATHETERIZATION N/A 09/21/2014   Procedure: Left Heart Cath and Coronary Angiography;  Surgeon: Lennette Bihari, MD;  Location: [facility] INVASIVE CV LAB;  Service: Cardiovascular;  Laterality: N/A;  . CORONARY ANGIOPLASTY WITH STENT PLACEMENT  01/17/2013   mild disease except 99% CFX, rx with  2.5 x 28 Alpine drug-eluting stent   . GROIN DEBRIDEMENT Right 04/14/2013   Procedure: Emergency Evacuation of Retroperitoneal Hematoma and Repair Right External Iliac Artery Pseudoaneurysm    ;  Surgeon: Sherren Kerns, MD;  Location: [facility] OR;  Service: Vascular;  Laterality: Right;  . LEFT HEART CATHETERIZATION WITH CORONARY ANGIOGRAM N/A 01/18/2013   Procedure: LEFT HEART CATHETERIZATION WITH CORONARY ANGIOGRAM;  Surgeon: Corky Crafts, MD;  Location: [facility] CATH LAB;  Service: Cardiovascular;  Laterality: N/A;  . LEFT HEART CATHETERIZATION WITH CORONARY ANGIOGRAM N/A 04/14/2013   Procedure: LEFT HEART CATHETERIZATION WITH CORONARY ANGIOGRAM;  Surgeon: Corky Crafts, MD;  Location: [facility] CATH LAB;  Service: Cardiovascular;  Laterality: N/A;  . PERCUTANEOUS CORONARY STENT  INTERVENTION (PCI-S)  01/18/2013   Procedure: PERCUTANEOUS CORONARY STENT INTERVENTION (PCI-S);  Surgeon: Corky Crafts, MD;  Location: [facility] CATH LAB;  Service: Cardiovascular;;  . TEE WITHOUT CARDIOVERSION N/A 03/06/2014   Procedure: TRANSESOPHAGEAL ECHOCARDIOGRAM (TEE);  Surgeon: Lars Masson, MD;  Location: [facility] ENDOSCOPY;  Service: Cardiovascular;  Laterality: N/A;  . TUBAL LIGATION     Social History   Social History  . Marital status: Single    Spouse name: N/A  . Number of children: N/A  . Years of education: N/A   Occupational History  . Not on file.   Social History Main Topics  . Smoking  status: Current Some Day Smoker    Packs/day: 0.50  . Smokeless tobacco: Never Used  . Alcohol use No  . Drug use:     Types: Marijuana  . Sexual activity: Yes    Birth control/ protection: None   Other Topics Concern  . Not on file   Social History Narrative  . No narrative on file     Review of Systems: General: negative for chills, fever, night sweats or weight changes.  Cardiovascular: negative for chest pain, dyspnea on exertion, edema, orthopnea, palpitations, paroxysmal nocturnal dyspnea or shortness of breath Dermatological: negative for rash Respiratory: negative for cough or wheezing Urologic: negative for hematuria Abdominal: negative for nausea, vomiting, diarrhea, bright red blood per rectum, melena, or hematemesis Neurologic: negative for visual changes, syncope, or dizziness All other systems reviewed and are otherwise negative except as noted above.   Physical Exam:  Blood pressure (!) 124/4, pulse 72, height 5\' 1"  (1.549 m), weight 121 lb 12.8 oz (55.2 kg), SpO2 96 %.  General appearance: alert, cooperative and no distress Neck: no carotid bruit and no JVD Lungs: clear to auscultation bilaterally Heart: regular rate and rhythm, S1, S2 normal, no murmur, click, rub or gallop Extremities: extremities normal, atraumatic, no cyanosis or edema Pulses: 2+  and symmetric Skin: Skin color, texture, turgor normal. No rashes or lesions Neurologic: Grossly normal  EKG not performed   ASSESSMENT AND PLAN:   50 y/o female, followed by Dr. Abe People, with a h/o CAD s/p lateral wall MI in 2015 s/p PCI to proximal LCx, long h/o tobacco use (recently quit), HTN, HLD and recent hospital discharge after admission in [place] for acute CHF, found to have new change in LVF with reduction in EF down to 40-45%, previously normal at 55%. Subsequent NST 11/10/15 showed no ischemia. She denies any recent CP. No resting dyspnea or LEE. No weight gain but notes weight loss and occasional hypotension. BP currently stable and well controlled. She is euvolemic on physical exam. We will change her lasix from daily to PRN based on daily weights. Low sodium diet advised. Continue BB, ACE-I and Imdur. F/u with Dr. Eldridge Dace in 3 months.     Robbie Lis PA-C 11/19/2015 1:26 PM

## 2015-11-19 NOTE — Patient Instructions (Signed)
Your physician has recommended you make the following change in your medication:  1,) take one tablet of lasix once a day based on your weight each day.   Take if you gain more than 3 pounds overnight or more than 5 pounds in 1 week. Your physician recommends that you schedule a follow-up appointment in: 3 months with Dr. Eldridge DaceVaranasi.  Low-Sodium Eating Plan Sodium raises blood pressure and causes water to be held in the body. Getting less sodium from food will help lower your blood pressure, reduce any swelling, and protect your heart, liver, and kidneys. We get sodium by adding salt (sodium chloride) to food. Most of our sodium comes from canned, boxed, and frozen foods. Restaurant foods, fast foods, and pizza are also very high in sodium. Even if you take medicine to lower your blood pressure or to reduce fluid in your body, getting less sodium from your food is important. What is my plan? Most people should limit their sodium intake to 2,300 mg a day. Your health care provider recommends that you limit your sodium intake to __________ a day. What do I need to know about this eating plan? For the low-sodium eating plan, you will follow these general guidelines:  Choose foods with a % Daily Value for sodium of less than 5% (as listed on the food label).  Use salt-free seasonings or herbs instead of table salt or sea salt.  Check with your health care provider or pharmacist before using salt substitutes.  Eat fresh foods.  Eat more vegetables and fruits.  Limit canned vegetables. If you do use them, rinse them well to decrease the sodium.  Limit cheese to 1 oz (28 g) per day.  Eat lower-sodium products, often labeled as "lower sodium" or "no salt added."  Avoid foods that contain monosodium glutamate (MSG). MSG is sometimes added to [place] food and some canned foods.  Check food labels (Nutrition Facts labels) on foods to learn how much sodium is in one serving.  Eat more home-cooked  food and less restaurant, buffet, and fast food.  When eating at a restaurant, ask that your food be prepared with less salt, or no salt if possible. How do I read food labels for sodium information? The Nutrition Facts label lists the amount of sodium in one serving of the food. If you eat more than one serving, you must multiply the listed amount of sodium by the number of servings. Food labels may also identify foods as:  Sodium free-Less than 5 mg in a serving.  Very low sodium-35 mg or less in a serving.  Low sodium-140 mg or less in a serving.  Light in sodium-50% less sodium in a serving. For example, if a food that usually has 300 mg of sodium is changed to become light in sodium, it will have 150 mg of sodium.  Reduced sodium-25% less sodium in a serving. For example, if a food that usually has 400 mg of sodium is changed to reduced sodium, it will have 300 mg of sodium. What foods can I eat? Grains  Low-sodium cereals, including oats, puffed wheat and rice, and shredded wheat cereals. Low-sodium crackers. Unsalted rice and pasta. Lower-sodium bread. Vegetables  Frozen or fresh vegetables. Low-sodium or reduced-sodium canned vegetables. Low-sodium or reduced-sodium tomato sauce and paste. Low-sodium or reduced-sodium tomato and vegetable juices. Fruits  Fresh, frozen, and canned fruit. Fruit juice. Meat and Other Protein Products  Low-sodium canned tuna and salmon. Fresh or frozen meat, poultry, seafood, and  fish. Randa Lynn. Unsalted nuts. Dried beans, peas, and lentils without added salt. Unsalted canned beans. Homemade soups without salt. Eggs. Dairy  Milk. Soy milk. Ricotta cheese. Low-sodium or reduced-sodium cheeses. Yogurt. Condiments  Fresh and dried herbs and spices. Salt-free seasonings. Onion and garlic powders. Low-sodium varieties of mustard and ketchup. Fresh or refrigerated horseradish. Lemon juice. Fats and Oils  Reduced-sodium salad dressings. Unsalted butter. Other   Unsalted popcorn and pretzels. The items listed above may not be a complete list of recommended foods or beverages. Contact your dietitian for more options.  What foods are not recommended? Grains  Instant hot cereals. Bread stuffing, pancake, and biscuit mixes. Croutons. Seasoned rice or pasta mixes. Noodle soup cups. Boxed or frozen macaroni and cheese. Self-rising flour. Regular salted crackers. Vegetables  Regular canned vegetables. Regular canned tomato sauce and paste. Regular tomato and vegetable juices. Frozen vegetables in sauces. Salted [place] fries. Olives. Rosita Fire. Relishes. Sauerkraut. Salsa. Meat and Other Protein Products  Salted, canned, smoked, spiced, or pickled meats, seafood, or fish. Bacon, ham, sausage, hot dogs, corned beef, chipped beef, and packaged luncheon meats. Salt pork. Jerky. Pickled herring. Anchovies, regular canned tuna, and sardines. Salted nuts. Dairy  Processed cheese and cheese spreads. Cheese curds. Blue cheese and cottage cheese. Buttermilk. Condiments  Onion and garlic salt, seasoned salt, table salt, and sea salt. Canned and packaged gravies. Worcestershire sauce. Tartar sauce. Barbecue sauce. Teriyaki sauce. Soy sauce, including reduced sodium. Steak sauce. Fish sauce. Oyster sauce. Cocktail sauce. Horseradish that you find on the shelf. Regular ketchup and mustard. Meat flavorings and tenderizers. Bouillon cubes. Hot sauce. Tabasco sauce. Marinades. Taco seasonings. Relishes. Fats and Oils  Regular salad dressings. Salted butter. Margarine. Ghee. Bacon fat. Other  Potato and tortilla chips. Corn chips and puffs. Salted popcorn and pretzels. Canned or dried soups. Pizza. Frozen entrees and pot pies. The items listed above may not be a complete list of foods and beverages to avoid. Contact your dietitian for more information.  This information is not intended to replace advice given to you by your health care provider. Make sure you discuss any questions  you have with your health care provider. Document Released: 06/10/2001 Document Revised: 05/27/2015 Document Reviewed: 10/23/2012 Elsevier Interactive Patient Education  2017 [employer].

## 2016-02-09 ENCOUNTER — Telehealth: Payer: Self-pay | Admitting: Interventional Cardiology

## 2016-02-09 NOTE — Telephone Encounter (Signed)
The pt states that her BP has been elevated since last night when it was 147/99 at bedtime. She states that her BP this am before taking her meds was 160/104 and that she checked her BP again at 10 am and it was 177/118. She reports that she does not take her evening dose of Carvedilol 6.25 mg because it causes her insomnia. She is advised to take her Carvedilol as advised and not to miss any doses if possible. Also, I attempted to schedule her a sooner appt but she declined every appt I offered stating that she now lives in [place] and has to get a ride here. She is scheduled to see Dr Eldridge Dace on 2/23 and is requesting to wait until then to be seen.  She is advised to monitor her BP X2 daily, write readings down and bring readings with her to her appt on 2/23. Also, I have advised her to call us back if her BP continues to be elevated after resuming her second Carvedilol dose every evening or to call 911 or have someone drive her to the closest ER or Urgent care if she develops headaches, dizziness, nervousness, or CP. She verbalized understanding.

## 2016-02-09 NOTE — Telephone Encounter (Signed)
New Message  Pt voiced wanting to speak to nurse.

## 2016-02-25 ENCOUNTER — Ambulatory Visit: Payer: Self-pay | Admitting: Interventional Cardiology

## 2016-03-22 ENCOUNTER — Encounter: Payer: Self-pay | Admitting: *Deleted

## 2016-04-04 ENCOUNTER — Ambulatory Visit (INDEPENDENT_AMBULATORY_CARE_PROVIDER_SITE_OTHER): Payer: Self-pay | Admitting: Interventional Cardiology

## 2016-04-04 ENCOUNTER — Encounter (INDEPENDENT_AMBULATORY_CARE_PROVIDER_SITE_OTHER): Payer: Self-pay

## 2016-04-04 ENCOUNTER — Encounter: Payer: Self-pay | Admitting: Interventional Cardiology

## 2016-04-04 VITALS — BP 130/84 | HR 80 | Ht 61.0 in | Wt 127.0 lb

## 2016-04-04 DIAGNOSIS — I10 Essential (primary) hypertension: Secondary | ICD-10-CM

## 2016-04-04 DIAGNOSIS — I252 Old myocardial infarction: Secondary | ICD-10-CM | POA: Insufficient documentation

## 2016-04-04 DIAGNOSIS — E785 Hyperlipidemia, unspecified: Secondary | ICD-10-CM

## 2016-04-04 DIAGNOSIS — I255 Ischemic cardiomyopathy: Secondary | ICD-10-CM

## 2016-04-04 DIAGNOSIS — I5032 Chronic diastolic (congestive) heart failure: Secondary | ICD-10-CM

## 2016-04-04 DIAGNOSIS — I25118 Atherosclerotic heart disease of native coronary artery with other forms of angina pectoris: Secondary | ICD-10-CM

## 2016-04-04 LAB — COMPREHENSIVE METABOLIC PANEL
ALT: 9 IU/L (ref 0–32)
AST: 17 IU/L (ref 0–40)
Albumin/Globulin Ratio: 1.2 (ref 1.2–2.2)
Albumin: 3.8 g/dL (ref 3.5–5.5)
Alkaline Phosphatase: 115 IU/L (ref 39–117)
BILIRUBIN TOTAL: 0.5 mg/dL (ref 0.0–1.2)
BUN/Creatinine Ratio: 12 (ref 9–23)
BUN: 9 mg/dL (ref 6–24)
CHLORIDE: 100 mmol/L (ref 96–106)
CO2: 21 mmol/L (ref 18–29)
Calcium: 9 mg/dL (ref 8.7–10.2)
Creatinine, Ser: 0.78 mg/dL (ref 0.57–1.00)
GFR calc non Af Amer: 89 mL/min/{1.73_m2} (ref >59)
GFR, EST AFRICAN AMERICAN: 103 mL/min/{1.73_m2} (ref >59)
GLOBULIN, TOTAL: 3.2 g/dL (ref 1.5–4.5)
GLUCOSE: 104 mg/dL — AB (ref 65–99)
Potassium: 4.4 mmol/L (ref 3.5–5.2)
SODIUM: 141 mmol/L (ref 134–144)
TOTAL PROTEIN: 7 g/dL (ref 6.0–8.5)

## 2016-04-04 LAB — LIPID PANEL
CHOL/HDL RATIO: 6.5 ratio — AB (ref 0.0–4.4)
CHOLESTEROL TOTAL: 274 mg/dL — AB (ref 100–199)
HDL: 42 mg/dL (ref >39)
LDL Calculated: 213 mg/dL — ABNORMAL HIGH (ref 0–99)
Triglycerides: 96 mg/dL (ref 0–149)
VLDL Cholesterol Cal: 19 mg/dL (ref 5–40)

## 2016-04-04 MED ORDER — CLOPIDOGREL BISULFATE 75 MG PO TABS: 75.0000 mg | ORAL_TABLET | Freq: Every day | ORAL | 3 refills | Status: DC

## 2016-04-04 NOTE — Progress Notes (Signed)
Patient ID: Lindsay Camacho, female   DOB: [birth year], 51 y.o.   MRN: [id number]     Cardiology Office Note   Date:  04/04/2016   ID:  Lindsay Camacho, DOB [birth year], MRN [id number]  PCP:  Pcp Not In System    No chief complaint on file. f/u CAD   Wt Readings from Last 3 Encounters:  04/04/16 127 lb (57.6 kg)  11/19/15 121 lb 12.8 oz (55.2 kg)  11/10/15 121 lb (54.9 kg)       History of Present Illness: Lindsay Camacho is a 51 y.o. female  Who has had a lateral wall MI in 2015.  She had recurrent chest pain in 2016 and had a repeat cath in 9/16 showing a patent stent.  She had an RP bleed requiring vascular surgery.    She went to [place] , [place] hospital and had another cath in 2017 showing a similar result per her report.  Her fluid pill was stopped at that time which led to more trouble breathing.  She continues to smoke on occasion before putting on her nicotine.    She reports SHOB improved with Lasix.  Hand swelling intermittently better with Lasix.    Since the last visit, she has had some increased BP readings.  SHe called EMS once due to high blood pressure, but when they checked it, it was normal.  She has had to take some increased dose of Lasix.  She has been doing this for a week.  She ran out of Plavix also.  She states she quit smoking last November.  No relapses, but she still reports some SHOB.  She states she walks a few times a day, for 10-15 minutes at a time.     Past Medical History:  Diagnosis Date  . Acute myocardial infarction of other lateral wall, initial episode of care 01/2013   DES CFX  . CAD (coronary artery disease) 11/13/2011   50% ? proximal LCx stenosis per Cath at [facility]  . Chest pain, atypical, muscular skelatal   01/09/2014  . HLD (hyperlipidemia)   . Hypertension   . Intermediate coronary syndrome (HCC)   . LBBB (left bundle branch block)   . PVD (peripheral vascular disease) (HCC)    Right CFA stenosis per report on 11/14/2011  . Stroke [facility])      tia with no deficits    Past Surgical History:  Procedure Laterality Date  . APPENDECTOMY    . CARDIAC CATHETERIZATION  04/14/2013   Non-obstructive disease, patent CFX stent  . CARDIAC CATHETERIZATION N/A 09/21/2014   Procedure: Left Heart Cath and Coronary Angiography;  Surgeon: Lennette Bihari, MD;  Location: [facility] INVASIVE CV LAB;  Service: Cardiovascular;  Laterality: N/A;  . CORONARY ANGIOPLASTY WITH STENT PLACEMENT  01/17/2013   mild disease except 99% CFX, rx with  2.5 x 28 Alpine drug-eluting stent   . GROIN DEBRIDEMENT Right 04/14/2013   Procedure: Emergency Evacuation of Retroperitoneal Hematoma and Repair Right External Iliac Artery Pseudoaneurysm    ;  Surgeon: Sherren Kerns, MD;  Location: [facility] OR;  Service: Vascular;  Laterality: Right;  . LEFT HEART CATHETERIZATION WITH CORONARY ANGIOGRAM N/A 01/18/2013   Procedure: LEFT HEART CATHETERIZATION WITH CORONARY ANGIOGRAM;  Surgeon: Corky Crafts, MD;  Location: [facility] CATH LAB;  Service: Cardiovascular;  Laterality: N/A;  . LEFT HEART CATHETERIZATION WITH CORONARY ANGIOGRAM N/A 04/14/2013   Procedure: LEFT HEART CATHETERIZATION WITH CORONARY ANGIOGRAM;  Surgeon: Corky Crafts, MD;  Location: [facility]  CATH LAB;  Service: Cardiovascular;  Laterality: N/A;  . PERCUTANEOUS CORONARY STENT INTERVENTION (PCI-S)  01/18/2013   Procedure: PERCUTANEOUS CORONARY STENT INTERVENTION (PCI-S);  Surgeon: Corky Crafts, MD;  Location: [facility] CATH LAB;  Service: Cardiovascular;;  . TEE WITHOUT CARDIOVERSION N/A 03/06/2014   Procedure: TRANSESOPHAGEAL ECHOCARDIOGRAM (TEE);  Surgeon: Lars Masson, MD;  Location: [facility] ENDOSCOPY;  Service: Cardiovascular;  Laterality: N/A;  . TUBAL LIGATION       Current Outpatient Prescriptions  Medication Sig Dispense Refill  . acetaminophen (TYLENOL) 325 MG tablet Take 325 mg by mouth every 6 (six) hours as needed for moderate pain.    Marland Kitchen aspirin 81 MG tablet Take 1 tablet (81 mg total) by mouth daily. 30 tablet    . atorvastatin (LIPITOR) 80 MG tablet Take 1 tablet (80 mg total) by mouth daily. 30 tablet 5  . carvedilol (COREG) 6.25 MG tablet Take 6.25 mg by mouth 2 (two) times daily with a meal.    . clopidogrel (PLAVIX) 75 MG tablet Take 1 tablet (75 mg total) by mouth daily with breakfast. 90 tablet 3  . docusate sodium (COLACE) 100 MG capsule Take 100 mg by mouth 2 (two) times daily as needed for mild constipation.    . furosemide (LASIX) 20 MG tablet Take 1 tablet (20 mg total) by mouth daily as needed for fluid. 30 tablet 6  . isosorbide mononitrate (IMDUR) 30 MG 24 hr tablet Take 1 tablet (30 mg total) by mouth daily. 90 tablet 2  . lisinopril (PRINIVIL,ZESTRIL) 10 MG tablet Take 10 mg by mouth daily.    . nitroGLYCERIN (NITROSTAT) 0.4 MG SL tablet Place 1 tablet (0.4 mg total) under the tongue every 5 (five) minutes x 3 doses as needed for chest pain. 25 tablet 6  . potassium chloride (K-DUR) 10 MEQ tablet Take 1 tablet (10 mEq total) by mouth daily. 30 tablet 6   No current facility-administered medications for this visit.     Allergies:   Brilinta [ticagrelor]; Other; Dye fdc red [red dye]; and Penicillins    Social History:  The patient  reports that she has been smoking.  She has been smoking about 0.50 packs per day. She has never used smokeless tobacco. She reports that she uses drugs, including Marijuana. She reports that she does not drink alcohol.   Family History:  The patient's family history includes Bladder Cancer in her mother; CAD (age of onset: 63) in her mother; CAD (age of onset: 30) in her father; Heart attack in her father and mother; Heart disease in her father and mother; Hypertension in her mother; Lung cancer in her mother; Stroke in her father and mother.    ROS:  Please see the history of present illness.   Otherwise, review of systems are positive for [facility], chest pain.   All other systems are reviewed and negative.    PHYSICAL EXAM: VS:  BP 130/84   Pulse 80   Ht  5\' 1"  (1.549 m)   Wt 127 lb (57.6 kg)   SpO2 98%   BMI 24.00 kg/m  , BMI Body mass index is 24 kg/m. GEN: Well nourished, well developed, in no acute distress  HEENT: normal  Neck: no JVD, carotid bruits, or masses Cardiac: RRR; no murmurs, rubs, or gallops,no lower extremity edema  Respiratory:  clear to auscultation bilaterally, normal work of breathing GI: soft, nontender, nondistended, + BS MS: no deformity or atrophy  Skin: warm and dry, no rash Neuro:  Strength and  sensation are intact Psych: euthymic mood, full affect  She still smells like cigarette smoke    Recent Labs: 10/27/2015: BUN 14; Creat 0.85; Potassium 3.7; Sodium 142   Lipid Panel    Component Value Date/Time   CHOL 229 (H) 02/08/2015 1211   TRIG 166 (H) 02/08/2015 1211   HDL 37 (L) 02/08/2015 1211   CHOLHDL 6.2 (H) 02/08/2015 1211   VLDL 33 (H) 02/08/2015 1211   LDLCALC 159 (H) 02/08/2015 1211     Other studies Reviewed: Additional studies/ records that were reviewed today with results demonstrating: .   ASSESSMENT AND PLAN:  1. CAD/old MI/chronic diastolic heart failure: She reports mild fluid overload, mostly in her fingers.  EF normal in the past. Continue Furosemide 20 mg daily prn fluid overload.  I don't think she will need this every day.  She has been taking it more frequently of late.  WIll check labs today. Need to se that potassium is ok since she has taken extra Lasix in the last week.  Refill Plavix.  Angina controlled on medical therapy at this time.  2. Tobacco abuse: She states she stops smoking and is not around second hand smoke, but she smells of cigarettes today in the office. 3. Hyperlipidemia:  Continue atorvastatin 80 mg daily.  Recheck lipids to see if there has been improvement. Check labs today.   4. HTN: Normal BP today.  COntinue to follow.  Her cuff may not have been working properly when she had the high readings. 5. She is applying for disability.  She applied in [place] and  was denied.  SHe will be applying in [place].  SHe was denied and now is in the first appeal.  EF had improved at last check.   Current medicines are reviewed at length with the patient today.  The patient concerns regarding her medicines were addressed.  The following changes have been made:  Restart Plavix  Labs/ tests ordered today include:  No orders of the defined types were placed in this encounter.   Recommend 150 minutes/week of aerobic exercise Low fat, low carb, high fiber diet recommended  Disposition:   FU in 6 months   Signed, Lance Muss, MD  04/04/2016 9:03 AM    [facility] [address] [place], [place], [place]  [address] Phone: [phone]; Fax: [phone]

## 2016-04-04 NOTE — Patient Instructions (Signed)
Medication Instructions:  Your physician recommends that you continue on your current medications as directed. Please refer to the Current Medication list given to you today.   Labwork: CMET, LIPIDS  Testing/Procedures: None ordered.  Follow-Up: Your physician wants you to follow-up in: 6 months with Dr. Eldridge Dace. You will receive a reminder letter in the mail two months in advance. If you don't receive a letter, please call our office to schedule the follow-up appointment.   Any Other Special Instructions Will Be Listed Below (If Applicable).     If you need a refill on your cardiac medications before your next appointment, please call your pharmacy.

## 2016-04-05 ENCOUNTER — Telehealth: Payer: Self-pay | Admitting: Interventional Cardiology

## 2016-04-05 NOTE — Telephone Encounter (Signed)
Patient is returning your call,thanks. °

## 2016-04-05 NOTE — Telephone Encounter (Signed)
Patient states that she has been out of her Lipitor since November and just restarted taking Lipitor 80 mg daily about 2 weeks ago. Result Note Message routed to Dr. Eldridge Dace to review.

## 2016-04-05 NOTE — Telephone Encounter (Signed)
-----   Message from Corky Crafts, MD sent at 04/04/2016  8:54 PM EDT ----- LDL way up.  Has she been taking lipitor?

## 2016-04-06 ENCOUNTER — Telehealth: Payer: Self-pay | Admitting: Interventional Cardiology

## 2016-04-06 ENCOUNTER — Other Ambulatory Visit: Payer: Self-pay

## 2016-04-06 MED ORDER — EZETIMIBE 10 MG PO TABS: 10.0000 mg | ORAL_TABLET | Freq: Every day | ORAL | 3 refills | Status: DC

## 2016-04-06 NOTE — Telephone Encounter (Signed)
[name], Any options?  Why would clopidogrel be so expensive?

## 2016-04-06 NOTE — Telephone Encounter (Signed)
Called the pharmacy where her prescriptions were sent to and this is the cash price for a 90 day supply of each medication. She does not have any insurance on file there. She'll need to call the pharmacy and provide them with her insurance information. If she does not have any, clopidogrel is cheapest at a [facility] ($10 for 30 days). Ezetimibe is unfortunately not on any pharmacy discount lists.

## 2016-04-06 NOTE — Telephone Encounter (Signed)
Patient agrees to start zetia 10 mg QD in addtion to her lipitor 80 mg QD. Prescription sent to patient's preferred pharmacy.

## 2016-04-06 NOTE — Telephone Encounter (Signed)
Patient is calling back and states that she cannot afford her medications. When asked which medications, the patient states that the zetia prescription I sent in this morning and her plavix. She states that when she called her pharmacy they said that one was $90 and the other was $100. She states that she has not taken her plavix in 2 weeks. Is there another option for her? Please advise. Thanks.

## 2016-04-06 NOTE — Telephone Encounter (Signed)
New message     Pt returning your call. She spoke with you yesterday about lab work, is there something wrong

## 2016-04-06 NOTE — Telephone Encounter (Signed)
Patient states that she does not have any insurance at this time. I let the patient know that the clopidogrel is cheapest at [facility]. Patient states that she is not a [employer] and no one in her family is. I told her that sometimes online there are coupons for discounts on certain medications that she could look into that. Patient states that she has no source of income at this time. She states that the only way she got her medicines last time because they were added to her bill in the hospital.

## 2016-04-06 NOTE — Telephone Encounter (Signed)
-----   Message from Awilda Metro, [facility] sent at 04/05/2016  4:17 PM EDT ----- Would recommend adding Zetia 10mg  daily in addition to her Lipitor 80mg  to help bring LDL to goal < 70 given hx of ASCVD. She may be a candidate for PCSK9i in the future if her LDL remains above goal upon recheck.

## 2016-04-06 NOTE — Telephone Encounter (Signed)
New message    Pt is calling stating that she can not afford to get her medications. She is asking for a call back.

## 2016-04-07 NOTE — Telephone Encounter (Signed)
She should not need to be a member of [employer] to use their pharmacy. If she has no income, she should apply for Medicaid ASAP to help with the cost of her medications.

## 2016-04-11 MED ORDER — CLOPIDOGREL BISULFATE 75 MG PO TABS: 75.0000 mg | ORAL_TABLET | Freq: Every day | ORAL | 11 refills | Status: DC

## 2016-04-11 NOTE — Telephone Encounter (Signed)
Patient made aware that she does not have to be a member to get her medications from [facility]. Prescription for 30 day supply of clopidogrel 75 mg daily with 11 refills sent to [facility] in [place], [place]. Patient states that she is going to apply for medicaid.

## 2016-04-11 NOTE — Telephone Encounter (Signed)
Left message for patient to call back  

## 2016-04-11 NOTE — Telephone Encounter (Signed)
Follow Up   Pt returning phone call from [facility]

## 2016-04-11 NOTE — Telephone Encounter (Signed)
Follow up   Pt returning phone call to [place]

## 2016-08-17 ENCOUNTER — Inpatient Hospital Stay (HOSPITAL_COMMUNITY): Payer: Self-pay

## 2016-08-17 ENCOUNTER — Inpatient Hospital Stay (HOSPITAL_COMMUNITY)
Admission: AD | Admit: 2016-08-17 | Discharge: 2016-08-31 | DRG: 216 | Disposition: A | Discharge status: Home / Self Care | Payer: Self-pay | Source: Other Acute Inpatient Hospital | Attending: Thoracic Surgery (Cardiothoracic Vascular Surgery) | Admitting: Thoracic Surgery (Cardiothoracic Vascular Surgery)

## 2016-08-17 ENCOUNTER — Observation Stay (HOSPITAL_COMMUNITY): Payer: Self-pay

## 2016-08-17 DIAGNOSIS — J9811 Atelectasis: Secondary | ICD-10-CM | POA: Diagnosis not present

## 2016-08-17 DIAGNOSIS — I2510 Atherosclerotic heart disease of native coronary artery without angina pectoris: Secondary | ICD-10-CM | POA: Diagnosis present

## 2016-08-17 DIAGNOSIS — T501X5A Adverse effect of loop [high-ceiling] diuretics, initial encounter: Secondary | ICD-10-CM | POA: Diagnosis present

## 2016-08-17 DIAGNOSIS — I771 Stricture of artery: Secondary | ICD-10-CM | POA: Diagnosis present

## 2016-08-17 DIAGNOSIS — K0530 Chronic periodontitis, unspecified: Secondary | ICD-10-CM | POA: Diagnosis present

## 2016-08-17 DIAGNOSIS — Z888 Allergy status to other drugs, medicaments and biological substances status: Secondary | ICD-10-CM

## 2016-08-17 DIAGNOSIS — D500 Iron deficiency anemia secondary to blood loss (chronic): Secondary | ICD-10-CM | POA: Diagnosis present

## 2016-08-17 DIAGNOSIS — Z79899 Other long term (current) drug therapy: Secondary | ICD-10-CM

## 2016-08-17 DIAGNOSIS — K083 Retained dental root: Secondary | ICD-10-CM | POA: Diagnosis present

## 2016-08-17 DIAGNOSIS — I420 Dilated cardiomyopathy: Secondary | ICD-10-CM | POA: Diagnosis present

## 2016-08-17 DIAGNOSIS — I5023 Acute on chronic systolic (congestive) heart failure: Secondary | ICD-10-CM | POA: Diagnosis present

## 2016-08-17 DIAGNOSIS — R112 Nausea with vomiting, unspecified: Secondary | ICD-10-CM | POA: Diagnosis not present

## 2016-08-17 DIAGNOSIS — Z7902 Long term (current) use of antithrombotics/antiplatelets: Secondary | ICD-10-CM

## 2016-08-17 DIAGNOSIS — K045 Chronic apical periodontitis: Secondary | ICD-10-CM | POA: Diagnosis present

## 2016-08-17 DIAGNOSIS — I340 Nonrheumatic mitral (valve) insufficiency: Secondary | ICD-10-CM | POA: Diagnosis present

## 2016-08-17 DIAGNOSIS — E785 Hyperlipidemia, unspecified: Secondary | ICD-10-CM | POA: Diagnosis present

## 2016-08-17 DIAGNOSIS — K089 Disorder of teeth and supporting structures, unspecified: Secondary | ICD-10-CM

## 2016-08-17 DIAGNOSIS — I509 Heart failure, unspecified: Secondary | ICD-10-CM

## 2016-08-17 DIAGNOSIS — I7409 Other arterial embolism and thrombosis of abdominal aorta: Secondary | ICD-10-CM | POA: Diagnosis present

## 2016-08-17 DIAGNOSIS — E876 Hypokalemia: Secondary | ICD-10-CM | POA: Diagnosis present

## 2016-08-17 DIAGNOSIS — M264 Malocclusion, unspecified: Secondary | ICD-10-CM | POA: Diagnosis present

## 2016-08-17 DIAGNOSIS — D62 Acute posthemorrhagic anemia: Secondary | ICD-10-CM | POA: Diagnosis not present

## 2016-08-17 DIAGNOSIS — R0602 Shortness of breath: Secondary | ICD-10-CM | POA: Diagnosis present

## 2016-08-17 DIAGNOSIS — I083 Combined rheumatic disorders of mitral, aortic and tricuspid valves: Principal | ICD-10-CM | POA: Diagnosis present

## 2016-08-17 DIAGNOSIS — Z880 Allergy status to penicillin: Secondary | ICD-10-CM

## 2016-08-17 DIAGNOSIS — I10 Essential (primary) hypertension: Secondary | ICD-10-CM | POA: Diagnosis present

## 2016-08-17 DIAGNOSIS — Z7982 Long term (current) use of aspirin: Secondary | ICD-10-CM

## 2016-08-17 DIAGNOSIS — I255 Ischemic cardiomyopathy: Secondary | ICD-10-CM | POA: Diagnosis present

## 2016-08-17 DIAGNOSIS — I959 Hypotension, unspecified: Secondary | ICD-10-CM | POA: Diagnosis present

## 2016-08-17 DIAGNOSIS — K029 Dental caries, unspecified: Secondary | ICD-10-CM | POA: Diagnosis present

## 2016-08-17 DIAGNOSIS — I5042 Chronic combined systolic (congestive) and diastolic (congestive) heart failure: Secondary | ICD-10-CM | POA: Diagnosis present

## 2016-08-17 DIAGNOSIS — Z8673 Personal history of transient ischemic attack (TIA), and cerebral infarction without residual deficits: Secondary | ICD-10-CM

## 2016-08-17 DIAGNOSIS — Z823 Family history of stroke: Secondary | ICD-10-CM

## 2016-08-17 DIAGNOSIS — I447 Left bundle-branch block, unspecified: Secondary | ICD-10-CM | POA: Diagnosis present

## 2016-08-17 DIAGNOSIS — I252 Old myocardial infarction: Secondary | ICD-10-CM

## 2016-08-17 DIAGNOSIS — I110 Hypertensive heart disease with heart failure: Secondary | ICD-10-CM | POA: Diagnosis present

## 2016-08-17 DIAGNOSIS — K0889 Other specified disorders of teeth and supporting structures: Secondary | ICD-10-CM | POA: Diagnosis present

## 2016-08-17 DIAGNOSIS — I4891 Unspecified atrial fibrillation: Secondary | ICD-10-CM | POA: Diagnosis not present

## 2016-08-17 DIAGNOSIS — J939 Pneumothorax, unspecified: Secondary | ICD-10-CM

## 2016-08-17 DIAGNOSIS — Z955 Presence of coronary angioplasty implant and graft: Secondary | ICD-10-CM

## 2016-08-17 DIAGNOSIS — Z8249 Family history of ischemic heart disease and other diseases of the circulatory system: Secondary | ICD-10-CM

## 2016-08-17 DIAGNOSIS — F17210 Nicotine dependence, cigarettes, uncomplicated: Secondary | ICD-10-CM | POA: Diagnosis present

## 2016-08-17 DIAGNOSIS — I708 Atherosclerosis of other arteries: Secondary | ICD-10-CM | POA: Diagnosis present

## 2016-08-17 DIAGNOSIS — Z9889 Other specified postprocedural states: Secondary | ICD-10-CM

## 2016-08-17 DIAGNOSIS — Z91041 Radiographic dye allergy status: Secondary | ICD-10-CM

## 2016-08-17 DIAGNOSIS — D6959 Other secondary thrombocytopenia: Secondary | ICD-10-CM | POA: Diagnosis not present

## 2016-08-17 DIAGNOSIS — I5022 Chronic systolic (congestive) heart failure: Secondary | ICD-10-CM | POA: Diagnosis present

## 2016-08-17 DIAGNOSIS — I361 Nonrheumatic tricuspid (valve) insufficiency: Secondary | ICD-10-CM

## 2016-08-17 DIAGNOSIS — K06020 Generalized gingival recession, unspecified: Secondary | ICD-10-CM | POA: Diagnosis present

## 2016-08-17 HISTORY — DX: Other arterial embolism and thrombosis of abdominal aorta: I74.09

## 2016-08-17 HISTORY — DX: Other specified postprocedural states: Z98.890

## 2016-08-17 HISTORY — DX: Nonrheumatic mitral (valve) insufficiency: I34.0

## 2016-08-17 HISTORY — DX: Heart failure, unspecified: I50.9

## 2016-08-17 HISTORY — DX: Dyspnea, unspecified: R06.00

## 2016-08-17 HISTORY — DX: Stricture of artery: I77.1

## 2016-08-17 LAB — ECHOCARDIOGRAM COMPLETE
AOASC: 24 cm
AVPHT: 371 ms
Area-P 1/2: 2.89 cm2
CHL CUP DOP CALC LVOT VTI: 15.5 cm
CHL CUP LVOT MV VTI INDEX: 0.54 cm2/m2
CHL CUP LVOT MV VTI: 0.84
CHL CUP STROKE VOLUME: 48 mL
E decel time: 176 msec
E/e' ratio: 19.49
FS: 23 % — AB (ref 28–44)
HEIGHTINCHES: 61 in
IVS/LV PW RATIO, ED: 0.99
LA vol A4C: 68.5 ml
LA vol index: 48.9 mL/m2
LA vol: 76.3 mL
LADIAMINDEX: 2.88 cm/m2
LASIZE: 45 mm
LDCA: 2.01 cm2
LEFT ATRIUM END SYS DIAM: 45 mm
LV E/e'average: 19.49
LV PW d: 10.4 mm — AB (ref 0.6–1.1)
LV SIMPSON'S DISK: 41
LV TDI E'MEDIAL: 5.11
LV dias vol index: 75 mL/m2
LV dias vol: 117 mL — AB (ref 46–106)
LVEEMED: 19.49
LVELAT: 6.31 cm/s
LVOT SV: 31 mL
LVOTD: 16 mm
LVOTPV: 96 cm/s
LVSYSVOL: 69 mL — AB (ref 14–42)
LVSYSVOLIN: 44 mL/m2
Lateral S' vel: 9.55 cm/s
MV Dec: 176
MV M vel: 78.2
MV Peak grad: 6 mmHg
MV pk E vel: 123 m/s
MVANNULUSVTI: 36.9 cm
MVG: 3 mmHg
MVPKAVEL: 52.6 m/s
MVSPHT: 76 ms
PISA EROA: 0.32 cm2
RV sys press: 29 mmHg
Reg peak vel: 255 cm/s
TAPSE: 20.7 mm
TDI e' lateral: 6.31
TRMAXVEL: 255 cm/s
VTI: 148 cm
WEIGHTICAEL: 2054.4 [oz_av]

## 2016-08-17 LAB — CBC
HEMATOCRIT: 37.1 % (ref 36.0–46.0)
HEMOGLOBIN: 12.1 g/dL (ref 12.0–15.0)
MCH: 28.5 pg (ref 26.0–34.0)
MCHC: 32.6 g/dL (ref 30.0–36.0)
MCV: 87.3 fL (ref 78.0–100.0)
Platelets: 212 10*3/uL (ref 150–400)
RBC: 4.25 MIL/uL (ref 3.87–5.11)
RDW: 14.1 % (ref 11.5–15.5)
WBC: 5.8 10*3/uL (ref 4.0–10.5)

## 2016-08-17 LAB — HIV ANTIBODY (ROUTINE TESTING W REFLEX): HIV SCREEN 4TH GENERATION: NONREACTIVE

## 2016-08-17 LAB — CREATININE, SERUM: Creatinine, Ser: 0.87 mg/dL (ref 0.44–1.00)

## 2016-08-17 MED ORDER — SODIUM CHLORIDE 0.9% FLUSH
3.0000 mL | Freq: Two times a day (BID) | INTRAVENOUS | Status: DC
  Administered 2016-08-17 – 2016-08-24 (×13): 3 mL via INTRAVENOUS

## 2016-08-17 MED ORDER — DIPHENHYDRAMINE HCL 50 MG/ML IJ SOLN
25.0000 mg | Freq: Once | INTRAMUSCULAR | Status: AC
  Administered 2016-08-17: 25 mg via INTRAVENOUS
  Filled 2016-08-17: qty 1

## 2016-08-17 MED ORDER — SODIUM CHLORIDE 0.9 % IV SOLN
250.0000 mL | INTRAVENOUS | Status: DC | PRN
  Administered 2016-08-18: 250 mL via INTRAVENOUS

## 2016-08-17 MED ORDER — TRAMADOL HCL 50 MG PO TABS
50.0000 mg | ORAL_TABLET | Freq: Four times a day (QID) | ORAL | Status: DC | PRN
  Administered 2016-08-17 – 2016-08-23 (×15): 50 mg via ORAL
  Filled 2016-08-17 (×15): qty 1

## 2016-08-17 MED ORDER — ATORVASTATIN CALCIUM 80 MG PO TABS
80.0000 mg | ORAL_TABLET | Freq: Every day | ORAL | Status: DC
  Administered 2016-08-17 – 2016-08-31 (×14): 80 mg via ORAL
  Filled 2016-08-17 (×14): qty 1

## 2016-08-17 MED ORDER — SODIUM CHLORIDE 0.9% FLUSH: 3.0000 mL | INTRAVENOUS | Status: DC | PRN

## 2016-08-17 MED ORDER — CLOPIDOGREL BISULFATE 75 MG PO TABS
75.0000 mg | ORAL_TABLET | Freq: Every day | ORAL | Status: DC
  Administered 2016-08-17 – 2016-08-18 (×2): 75 mg via ORAL
  Filled 2016-08-17 (×2): qty 1

## 2016-08-17 MED ORDER — FUROSEMIDE 20 MG PO TABS: 20.0000 mg | ORAL_TABLET | Freq: Every day | ORAL | Status: DC | PRN

## 2016-08-17 MED ORDER — ENOXAPARIN SODIUM 40 MG/0.4ML ~~LOC~~ SOLN
40.0000 mg | SUBCUTANEOUS | Status: DC
  Administered 2016-08-17 – 2016-08-20 (×4): 40 mg via SUBCUTANEOUS
  Filled 2016-08-17 (×4): qty 0.4

## 2016-08-17 MED ORDER — ACETAMINOPHEN 325 MG PO TABS: 325.0000 mg | ORAL_TABLET | Freq: Four times a day (QID) | ORAL | Status: DC | PRN

## 2016-08-17 MED ORDER — POTASSIUM CHLORIDE CRYS ER 10 MEQ PO TBCR
10.0000 meq | EXTENDED_RELEASE_TABLET | Freq: Every day | ORAL | Status: DC
  Administered 2016-08-17 – 2016-08-24 (×8): 10 meq via ORAL
  Filled 2016-08-17 (×8): qty 1

## 2016-08-17 MED ORDER — DIPHENHYDRAMINE HCL 25 MG PO CAPS
25.0000 mg | ORAL_CAPSULE | Freq: Three times a day (TID) | ORAL | Status: DC | PRN
  Administered 2016-08-17 – 2016-08-24 (×11): 25 mg via ORAL
  Filled 2016-08-17 (×12): qty 1

## 2016-08-17 MED ORDER — ALBUTEROL SULFATE (2.5 MG/3ML) 0.083% IN NEBU
2.5000 mg | INHALATION_SOLUTION | Freq: Four times a day (QID) | RESPIRATORY_TRACT | Status: DC | PRN
  Administered 2016-08-20 – 2016-08-22 (×2): 2.5 mg via RESPIRATORY_TRACT
  Filled 2016-08-17: qty 3

## 2016-08-17 MED ORDER — ASPIRIN EC 81 MG PO TBEC
81.0000 mg | DELAYED_RELEASE_TABLET | Freq: Every day | ORAL | Status: DC
  Administered 2016-08-17 – 2016-08-24 (×7): 81 mg via ORAL
  Filled 2016-08-17 (×7): qty 1

## 2016-08-17 MED ORDER — ONDANSETRON HCL 4 MG/2ML IJ SOLN
4.0000 mg | Freq: Four times a day (QID) | INTRAMUSCULAR | Status: DC | PRN
  Administered 2016-08-19 – 2016-08-24 (×10): 4 mg via INTRAVENOUS
  Filled 2016-08-17 (×9): qty 2

## 2016-08-17 MED ORDER — ISOSORBIDE MONONITRATE ER 30 MG PO TB24
30.0000 mg | ORAL_TABLET | Freq: Every day | ORAL | Status: DC
  Filled 2016-08-17: qty 1

## 2016-08-17 MED ORDER — LISINOPRIL 10 MG PO TABS
10.0000 mg | ORAL_TABLET | Freq: Every day | ORAL | Status: DC
  Filled 2016-08-17: qty 1

## 2016-08-17 MED ORDER — DOCUSATE SODIUM 100 MG PO CAPS
100.0000 mg | ORAL_CAPSULE | Freq: Two times a day (BID) | ORAL | Status: DC | PRN
  Administered 2016-08-18 – 2016-08-24 (×4): 100 mg via ORAL
  Filled 2016-08-17 (×6): qty 1

## 2016-08-17 MED ORDER — ACETAMINOPHEN 325 MG PO TABS
650.0000 mg | ORAL_TABLET | ORAL | Status: DC | PRN
  Administered 2016-08-19 – 2016-08-22 (×2): 650 mg via ORAL
  Filled 2016-08-17 (×2): qty 2

## 2016-08-17 MED ORDER — CARVEDILOL 6.25 MG PO TABS
6.2500 mg | ORAL_TABLET | Freq: Two times a day (BID) | ORAL | Status: DC
  Administered 2016-08-17 – 2016-08-24 (×13): 6.25 mg via ORAL
  Filled 2016-08-17 (×4): qty 1
  Filled 2016-08-17: qty 2
  Filled 2016-08-17: qty 1
  Filled 2016-08-17: qty 2
  Filled 2016-08-17 (×5): qty 1
  Filled 2016-08-17 (×3): qty 2

## 2016-08-17 NOTE — H&P (Signed)
History and Physical   Patient ID: Lindsay Camacho, MRN: [id number], DOB: 1965/09/06   Date of Encounter: 08/17/2016, 1:20 AM  Primary Care Provider: System, Pcp Not In Cardiologist: [facility] Electrophysiologist:  NA  Chief Complaint:  Severe MR  History of Present Illness: Lindsay Camacho is a 51 y.o. female w/ h/o CAD s/p PCI in 2016, HTN, dyslipidemia, HFrEF with EF ~30-40% transferred from [facility] for possible MV clip procedure. Apparently she was admitted in [place] a few days ago c/o worsening Sob/DOE and HTN, and was treated for HF exacerbation. There was an echo that had been done there in July that has no impression or findings documented; notes from this most recent hospital stay indicate that that echo actually showed moderate to severe MR.  Currently pt is doing OK w/o any significant complaints. She was told she has a "leaky valve" that needs to be fixed and is being sent here for that to happen.  Past Medical History:  Diagnosis Date  . Acute myocardial infarction of other lateral wall, initial episode of care 01/2013   DES CFX  . CAD (coronary artery disease) 11/13/2011   50% ? proximal LCx stenosis per Cath at [facility]  . Chest pain, atypical, muscular skelatal   01/09/2014  . HLD (hyperlipidemia)   . Hypertension   . Intermediate coronary syndrome (HCC)   . LBBB (left bundle branch block)   . PVD (peripheral vascular disease) (HCC)    Right CFA stenosis per report on 11/14/2011  . Stroke [facility])    tia with no deficits    Past Surgical History:  Procedure Laterality Date  . APPENDECTOMY    . CARDIAC CATHETERIZATION  04/14/2013   Non-obstructive disease, patent CFX stent  . CARDIAC CATHETERIZATION N/A 09/21/2014   Procedure: Left Heart Cath and Coronary Angiography;  Surgeon: Lennette Bihari, MD;  Location: [facility] INVASIVE CV LAB;  Service: Cardiovascular;  Laterality: N/A;  . CORONARY ANGIOPLASTY WITH STENT PLACEMENT  01/17/2013   mild disease except 99% CFX, rx with  2.5 x  28 Alpine drug-eluting stent   . GROIN DEBRIDEMENT Right 04/14/2013   Procedure: Emergency Evacuation of Retroperitoneal Hematoma and Repair Right External Iliac Artery Pseudoaneurysm    ;  Surgeon: Sherren Kerns, MD;  Location: [facility] OR;  Service: Vascular;  Laterality: Right;  . LEFT HEART CATHETERIZATION WITH CORONARY ANGIOGRAM N/A 01/18/2013   Procedure: LEFT HEART CATHETERIZATION WITH CORONARY ANGIOGRAM;  Surgeon: Corky Crafts, MD;  Location: [facility] CATH LAB;  Service: Cardiovascular;  Laterality: N/A;  . LEFT HEART CATHETERIZATION WITH CORONARY ANGIOGRAM N/A 04/14/2013   Procedure: LEFT HEART CATHETERIZATION WITH CORONARY ANGIOGRAM;  Surgeon: Corky Crafts, MD;  Location: [facility] CATH LAB;  Service: Cardiovascular;  Laterality: N/A;  . PERCUTANEOUS CORONARY STENT INTERVENTION (PCI-S)  01/18/2013   Procedure: PERCUTANEOUS CORONARY STENT INTERVENTION (PCI-S);  Surgeon: Corky Crafts, MD;  Location: [facility] CATH LAB;  Service: Cardiovascular;;  . TEE WITHOUT CARDIOVERSION N/A 03/06/2014   Procedure: TRANSESOPHAGEAL ECHOCARDIOGRAM (TEE);  Surgeon: Lars Masson, MD;  Location: [facility] ENDOSCOPY;  Service: Cardiovascular;  Laterality: N/A;  . TUBAL LIGATION       Prior to Admission medications   Medication Sig Start Date End Date Taking? Authorizing Provider  acetaminophen (TYLENOL) 325 MG tablet Take 325 mg by mouth every 6 (six) hours as needed for moderate pain.    [provider]  aspirin 81 MG tablet Take 1 tablet (81 mg total) by mouth daily. 10/08/12   Crista Curb  L, MD  atorvastatin (LIPITOR) 80 MG tablet Take 1 tablet (80 mg total) by mouth daily. 11/09/14   Corky Crafts, MD  carvedilol (COREG) 6.25 MG tablet Take 6.25 mg by mouth 2 (two) times daily with a meal.    [provider]  clopidogrel (PLAVIX) 75 MG tablet Take 1 tablet (75 mg total) by mouth daily with breakfast. 04/11/16   Corky Crafts, MD  docusate sodium (COLACE) 100 MG capsule Take 100  mg by mouth 2 (two) times daily as needed for mild constipation.    [provider]  furosemide (LASIX) 20 MG tablet Take 1 tablet (20 mg total) by mouth daily as needed for fluid. 02/08/15   Corky Crafts, MD  isosorbide mononitrate (IMDUR) 30 MG 24 hr tablet Take 1 tablet (30 mg total) by mouth daily. 11/09/14   Corky Crafts, MD  lisinopril (PRINIVIL,ZESTRIL) 10 MG tablet Take 10 mg by mouth daily.    [provider]  nitroGLYCERIN (NITROSTAT) 0.4 MG SL tablet Place 1 tablet (0.4 mg total) under the tongue every 5 (five) minutes x 3 doses as needed for chest pain. 10/27/13   Corky Crafts, MD  potassium chloride (K-DUR) 10 MEQ tablet Take 1 tablet (10 mEq total) by mouth daily. 11/09/14   Corky Crafts, MD     Allergies: Allergies  Allergen Reactions  . Brilinta [Ticagrelor] Other (See Comments)    Dyspnea  . Other Other (See Comments)    BP med (unsure of name) BP bottomed out  . Dye Fdc Red [Red Dye] Other (See Comments)    Allergic to dye that is used in nuclear stress test. Pt sates makes her feel like her blood is boiling and itching  . Penicillins Rash    Social History:  The patient  reports that she has been smoking.  She has been smoking about 0.50 packs per day. She has never used smokeless tobacco. She reports that she uses drugs, including Marijuana. She reports that she does not drink alcohol.   Family History:  The patient's family history includes Bladder Cancer in her mother; CAD (age of onset: 52) in her mother; CAD (age of onset: 34) in her father; Heart attack in her father and mother; Heart disease in her father and mother; Hypertension in her mother; Lung cancer in her mother; Stroke in her father and mother.   ROS:  Please see the history of present illness.     All other systems reviewed and negative.   Vital Signs: Blood pressure 109/71, temperature 98.4 F (36.9 C), height 5\' 1"  (1.549 m), weight 58.2 kg (128 lb 6.4  oz), SpO2 100 %.  PHYSICAL EXAM: General:  Well nourished, well developed, in no acute distress HEENT: normal Lymph: no adenopathy Neck: no JVD Endocrine:  No thryomegaly Vascular: No carotid bruits; DP pulses 2+ bilaterally  Cardiac:  normal S1, S2; RRR; 2/6 sys murmur along LSB Lungs:  clear to auscultation bilaterally, no wheezing, rhonchi or rales  Abd: soft, nontender, no hepatomegaly  Ext: no edema Musculoskeletal:  No deformities, BUE and BLE strength normal and equal Skin: warm and dry  Neuro:  CNs 2-12 intact, no focal abnormalities noted Psych:  Normal affect   EKG:  10-27-15 NSR with LBBB  Labs:   Lab Results  Component Value Date   WBC 6.4 09/18/2014   HGB 12.8 09/18/2014   HCT 39.6 09/18/2014   MCV 90.6 09/18/2014   PLT 173 09/18/2014   No  results for input(s): NA, K, CL, CO2, BUN, CREATININE, CALCIUM, PROT, BILITOT, ALKPHOS, ALT, AST, GLUCOSE in the last 168 hours.  Invalid input(s): LABALBU No results for input(s): CKTOTAL, CKMB, TROPONINI in the last 72 hours. Troponin (Point of Care Test) No results for input(s): TROPIPOC in the last 72 hours.  Lab Results  Component Value Date   CHOL 274 (H) 04/04/2016   HDL 42 04/04/2016   LDLCALC 213 (H) 04/04/2016   TRIG 96 04/04/2016   Lab Results  Component Value Date   DDIMER 0.71 (H) 03/10/2014    Radiology/Studies:  No results found.   TTE 07-16-16 Multiple somewhat discrepant descriptions of echo findings given in various notes although there is no formal report. EF 35-40%, mod-severe MR, mild AI, mild PI  Nuc stress test 11-10-15 showed no ischemia, LVEF <30%  09-21-14 LHC  Mid RCA lesion, 30% stenosed.  Dist RCA lesion, 30% stenosed.  The left ventricular systolic function is normal.   Low-normal LV function with an ejection fraction of 50% and evidence for mild distal anterolateral focal hypocontractility.  Mild nonobstructive CAD with a normal LAD, widely patent stent in the proximal  circumflex coronary artery , and dominant RCA with smooth 30% tubular narrowing in the mid segment and 30% distal narrowing prior to the PDA takeoff.  RECOMMENDATION: Medical therapy.  Smoking cessation is essential.  ASSESSMENT AND PLAN:   1. ? Severe MR: pt does have a murmur. It is difficult to ascertain what echo actually showed due to a lack of formal report. Will start w/ repeat TTE. If severe MR is indeed present, can proceed w/ TEE and further evaluation  2. CAD: non-obstructive CAD on last cath 2016. Most recent ischemia w/u was Nov 2017 at which time nuc stress showed no ischemia. Would hold off on any further ischemia w/u until situation w/ valve is clarified  3. HTN: cont regimen from transfer and titrate PRN  4. Dyslipidemia: LDL from April 2018 was extremely elevated. Pt has been on lipitor 80mg  daily. Will repeat FLP; if LDL not improved, could consider addition of zetia +/- PCSK9i  5. ? Subclavian stenosis: CT can be done if necessary to determine presence of subclavian stenosis  Thank you for the opportunity to participate in the care of this very pleasant patient. Will follow. Please call w/ questions.   Precious Reel, MD , [facility] 08/17/16 2:53 AM

## 2016-08-17 NOTE — Progress Notes (Signed)
Progress Note  Patient Name: Lindsay Camacho Date of Encounter: 08/17/2016  Primary Cardiologist: Dr. Eldridge Dace  Subjective   Reports having baseline orthopnea. Mild chest discomfort with breathing. No exertional symptoms.   Inpatient Medications    Scheduled Meds: . aspirin EC  81 mg Oral Daily  . atorvastatin  80 mg Oral Daily  . carvedilol  6.25 mg Oral BID WC  . clopidogrel  75 mg Oral Daily  . enoxaparin (LOVENOX) injection  40 mg Subcutaneous Q24H  . isosorbide mononitrate  30 mg Oral Daily  . lisinopril  10 mg Oral Daily  . potassium chloride  10 mEq Oral Daily  . sodium chloride flush  3 mL Intravenous Q12H   Continuous Infusions: . sodium chloride     PRN Meds: sodium chloride, acetaminophen, diphenhydrAMINE, docusate sodium, ondansetron (ZOFRAN) IV, sodium chloride flush   Vital Signs    Vitals:   08/17/16 0419 08/17/16 0506 08/17/16 0818 08/17/16 1011  BP:  (!) 97/51 113/73 (!) 92/49  Pulse: 74 79 80 78  Resp:  14    Temp:  97.7 F (36.5 C)    TempSrc:  Oral    SpO2: 100% 100%  100%  Weight:      Height:        Intake/Output Summary (Last 24 hours) at 08/17/16 1116 Last data filed at 08/17/16 0700  Gross per 24 hour  Intake                0 ml  Output              700 ml  Net             -700 ml   Filed Weights   08/17/16 0100  Weight: 128 lb 6.4 oz (58.2 kg)    Telemetry    NSR, HR 60's - 70's with PVC's  - Personally Reviewed  ECG    NSR, HR 73, with known LBBB and PVC's.  - Personally Reviewed  Physical Exam   General: Well developed, well nourished Caucasian female appearing in no acute distress. Head: Normocephalic, atraumatic.  Neck: Supple without bruits, JVD not elevated. Lungs:  Resp regular and unlabored, mild wheezing along upper lung fields. Heart: RRR, S1, S2, no S3, S4,  no rub. 2/6 SEM along Apex Abdomen: Soft, non-tender, non-distended with normoactive bowel sounds. No hepatomegaly. No rebound/guarding. No obvious  abdominal masses. Extremities: No clubbing, cyanosis, or lower extremity edema. Distal pedal pulses are 2+ bilaterally. Neuro: Alert and oriented X 3. Moves all extremities spontaneously. Psych: Normal affect.  Labs    Chemistry Recent Labs Lab 08/17/16 0345  CREATININE 0.87  GFRNONAA >60  GFRAA >60     Hematology Recent Labs Lab 08/17/16 0345  WBC 5.8  RBC 4.25  HGB 12.1  HCT 37.1  MCV 87.3  MCH 28.5  MCHC 32.6  RDW 14.1  PLT 212    Cardiac EnzymesNo results for input(s): TROPONINI in the last 168 hours. No results for input(s): TROPIPOC in the last 168 hours.   BNPNo results for input(s): BNP, PROBNP in the last 168 hours.   DDimer No results for input(s): DDIMER in the last 168 hours.   Radiology    No results found.  Cardiac Studies   Cardiac Catheterization: 02/2015   Patient Profile     51 y.o. female w/ PMH of CAD (s/p DES to LCx in 2015, patent by cath in 2016 and 2017), HTN, HLD, and moderate to severe MR  who presented to [facility] on 08/17/2016 as a transfer from [facility] for further evaluation of her mitral valve.    Assessment & Plan    1. Moderate to Severe MR - echo in 07/2016 showed an EF of 35-40% with moderate to severe MR with a mitral calve E to A ratio of 2.1. - repeat TTE has been performed this morning with the official report pending. Will plan for a TEE tomorrow for further assessment. Has been scheduled for tomorrow at noon with Dr. Jens Som. NPO after midnight.   2. CAD - s/p DES to LCx in 2015, patent by cath in 2016 and 2017. - continue ASA, Plavix, BB, and statin.   3. Chronic Systolic CHF - EF was 35-40% by echo in 07/2016. Repeat echo is pending.  - on Lasix 20mg  daily PTA but this was held during her recent admission secondary to hypotension. Will check BNP.   4. HTN - was hypotensive while at [facility] and BP has been soft while here with most recent BP at 92/49. Will hold PTA Imdur and Lisinopril at this  time. Continue BB therapy.   5. HLD - continue Atorvastatin 80mg  daily.   6. Atypical Chest Pain/ Dyspnea - reports baseline dyspnea and orthopnea with chest pain which has been worse with deep breathing. Echo is pending to assess her EF and valve function. Will order Albuterol nebulizer and portable CXR as she does have mild wheezing on examination.   Signed, Ellsworth Lennox , PA-C 11:16 AM 08/17/2016 Pager: [phone]

## 2016-08-17 NOTE — Progress Notes (Addendum)
Pt arrived. Transfer from [place]. Cardiology paged

## 2016-08-17 NOTE — Progress Notes (Signed)
  Echocardiogram 2D Echocardiogram has been performed.  Lindsay Camacho 08/17/2016, 12:15 PM 

## 2016-08-18 ENCOUNTER — Encounter (HOSPITAL_COMMUNITY)
Admission: AD | Disposition: A | Discharge status: Home / Self Care | Payer: Self-pay | Source: Other Acute Inpatient Hospital | Attending: Cardiology

## 2016-08-18 ENCOUNTER — Other Ambulatory Visit: Payer: Self-pay | Admitting: *Deleted

## 2016-08-18 ENCOUNTER — Inpatient Hospital Stay (HOSPITAL_COMMUNITY): Payer: Self-pay

## 2016-08-18 ENCOUNTER — Encounter (HOSPITAL_COMMUNITY): Payer: Self-pay | Admitting: General Practice

## 2016-08-18 DIAGNOSIS — I2510 Atherosclerotic heart disease of native coronary artery without angina pectoris: Secondary | ICD-10-CM

## 2016-08-18 DIAGNOSIS — I340 Nonrheumatic mitral (valve) insufficiency: Secondary | ICD-10-CM

## 2016-08-18 DIAGNOSIS — I5021 Acute systolic (congestive) heart failure: Secondary | ICD-10-CM

## 2016-08-18 HISTORY — PX: TEE WITHOUT CARDIOVERSION: SHX5443

## 2016-08-18 LAB — URINALYSIS, COMPLETE (UACMP) WITH MICROSCOPIC
Bilirubin Urine: NEGATIVE
GLUCOSE, UA: NEGATIVE mg/dL
Hgb urine dipstick: NEGATIVE
Ketones, ur: NEGATIVE mg/dL
Leukocytes, UA: NEGATIVE
Nitrite: NEGATIVE
PROTEIN: NEGATIVE mg/dL
SPECIFIC GRAVITY, URINE: 1.004 — AB (ref 1.005–1.030)
pH: 7 (ref 5.0–8.0)

## 2016-08-18 LAB — BASIC METABOLIC PANEL
Anion gap: 7 (ref 5–15)
BUN: 14 mg/dL (ref 6–20)
CALCIUM: 8.7 mg/dL — AB (ref 8.9–10.3)
CHLORIDE: 103 mmol/L (ref 101–111)
CO2: 28 mmol/L (ref 22–32)
CREATININE: 0.93 mg/dL (ref 0.44–1.00)
GFR calc Af Amer: >60 mL/min (ref >60)
Glucose, Bld: 87 mg/dL (ref 65–99)
Potassium: 4 mmol/L (ref 3.5–5.1)
SODIUM: 138 mmol/L (ref 135–145)

## 2016-08-18 LAB — BRAIN NATRIURETIC PEPTIDE: B NATRIURETIC PEPTIDE 5: 252.1 pg/mL — AB (ref 0.0–100.0)

## 2016-08-18 SURGERY — ECHOCARDIOGRAM, TRANSESOPHAGEAL
Anesthesia: Moderate Sedation

## 2016-08-18 MED ORDER — FENTANYL CITRATE (PF) 100 MCG/2ML IJ SOLN
INTRAMUSCULAR | Status: AC
  Filled 2016-08-18: qty 2

## 2016-08-18 MED ORDER — BUTAMBEN-TETRACAINE-BENZOCAINE 2-2-14 % EX AERO
INHALATION_SPRAY | CUTANEOUS | Status: DC | PRN
  Administered 2016-08-18: 2 via TOPICAL

## 2016-08-18 MED ORDER — CHLORHEXIDINE GLUCONATE 0.12 % MT SOLN
15.0000 mL | Freq: Two times a day (BID) | OROMUCOSAL | Status: DC
  Administered 2016-08-18 – 2016-08-24 (×11): 15 mL via OROMUCOSAL
  Filled 2016-08-18 (×11): qty 15

## 2016-08-18 MED ORDER — MIDAZOLAM HCL 10 MG/2ML IJ SOLN
INTRAMUSCULAR | Status: DC | PRN
  Administered 2016-08-18 (×2): 2 mg via INTRAVENOUS

## 2016-08-18 MED ORDER — FENTANYL CITRATE (PF) 100 MCG/2ML IJ SOLN
INTRAMUSCULAR | Status: DC | PRN
  Administered 2016-08-18: 25 ug via INTRAVENOUS

## 2016-08-18 MED ORDER — HYDROCORTISONE 0.5 % EX CREA
TOPICAL_CREAM | Freq: Three times a day (TID) | CUTANEOUS | Status: DC | PRN
  Administered 2016-08-18 – 2016-08-23 (×5): via TOPICAL
  Filled 2016-08-18: qty 28.35

## 2016-08-18 MED ORDER — MIDAZOLAM HCL 5 MG/ML IJ SOLN
INTRAMUSCULAR | Status: AC
Start: 2016-08-18 — End: 2016-08-18
  Filled 2016-08-18: qty 2

## 2016-08-18 NOTE — Consult Note (Signed)
[address]       [name] [address]             [phone]          CARDIOTHORACIC SURGERY CONSULTATION REPORT  PCP is System, Pcp Not In Referring Provider is Hilty, Lisette Abu, MD Primary Cardiologist is Corky Crafts, MD  Reason for consultation:  Severe mitral regurgitation  HPI:  Patient is a 51 year old female with history of coronary artery disease status post acute lateral wall myocardial infarction in January 2015, mitral regurgitation, chronic systolic congestive heart failure, left bundle branch block, hypertension, peripheral vascular disease, and hyperlipidemia who has been referred for surgical consultation to discuss treatment options for management of severe symptomatic mitral regurgitation with recurrent episodes of acute exacerbation of chronic congestive heart failure. The patient's cardiac history dates back to January 2015 when she presented acutely with a lateral wall myocardial infarction. She was treated with PCI and stenting of the left circumflex coronary artery using a drug-eluting stent.  She has been followed regularly by Dr. Eldridge Dace and reports that she has had significant exertional shortness of breath ever since.  Echocardiograms over the last 2 years have demonstrated the presence of moderate to severe left ventricular systolic dysfunction with at least moderate mitral regurgitation.  Follow-up catheterization performed in septic timbre of 2016 revealed patent stent and left circumflex coronary artery. She developed a retroperitoneal bleed requiring surgical repair of her right common femoral artery at that time. She has been hospitalized several times with acute exacerbation of congestive heart failure. She apparently had another catheterization in 2017 in [place], which again demonstrated patent stent in the left circumflex territory by report.  She was last seen by Dr. Eldridge Dace in April of this year at which time she was  doing a little better on medical therapy, although she continued to complain of symptoms of significant exertional shortness of breath.  The patient states that she was hospitalized last month in [place] with another episode of acute shortness of breath. She was told that she had pneumonia at the time although she denies any cough or fevers, and she states that symptoms were marginally improved at the time of hospital discharge.  An echocardiogram was performed at that time but apparently not read until recently. She was told by Dr. Hyacinth Meeker that her mitral valve was leaking. She was hospitalized again several days ago with worsening shortness breath and orthopnea. She was transferred to [facility] for management.  Echocardiogram performed 08/17/2016 revealed at least moderate left ventricular systolic dysfunction with ejection fraction estimated 35-40%. There was severe mitral regurgitation with flow reversal in the pulmonary veins. There is mild left atrial enlargement.  Transesophageal echocardiogram was performed earlier today and reported to demonstrate moderate global left ventricular systolic dysfunction with mild aortic insufficiency, restricted posterior leaflet, and severe central mitral regurgitation. Cardiothoracic surgical consultation was requested.  The patient is single and lives with her aunt in [place], [place].  She has been out of work since her heart attack in 2015 and has apparently applied for disability.  She has been chronically limited by shortness of breath. She states that her breathing has not been right ever since her heart attack. She gets short of breath with mild exertion and frequently at rest. She cannot lay flat in bed. She reports episodes of PND. She has not had any exertional chest pain or chest tightness. She denies any palpitations. She has had some  dizzy spells without syncope. She denies any productive cough but she has some wheezing with exertion. She quit  smoking last November.   Past Medical History:  Diagnosis Date  . Acute myocardial infarction of other lateral wall, initial episode of care 01/2013   DES CFX  . CAD (coronary artery disease) 11/13/2011   50% ? proximal LCx stenosis per Cath at [facility]  . Chest pain, atypical, muscular skelatal   01/09/2014  . Dyspnea   . Heart failure (HCC)   . HLD (hyperlipidemia)   . Hypertension   . Intermediate coronary syndrome (HCC)   . LBBB (left bundle branch block)   . Mitral regurgitation   . PVD (peripheral vascular disease) (HCC)    Right CFA stenosis per report on 11/14/2011  . Stroke [facility])    tia with no deficits    Past Surgical History:  Procedure Laterality Date  . APPENDECTOMY    . CARDIAC CATHETERIZATION  04/14/2013   Non-obstructive disease, patent CFX stent  . CARDIAC CATHETERIZATION N/A 09/21/2014   Procedure: Left Heart Cath and Coronary Angiography;  Surgeon: Lennette Bihari, MD;  Location: [facility] INVASIVE CV LAB;  Service: Cardiovascular;  Laterality: N/A;  . CORONARY ANGIOPLASTY WITH STENT PLACEMENT  01/17/2013   mild disease except 99% CFX, rx with  2.5 x 28 [place] drug-eluting stent   . GROIN DEBRIDEMENT Right 04/14/2013   Procedure: Emergency Evacuation of Retroperitoneal Hematoma and Repair Right External Iliac Artery Pseudoaneurysm    ;  Surgeon: Sherren Kerns, MD;  Location: [facility] OR;  Service: Vascular;  Laterality: Right;  . LEFT HEART CATHETERIZATION WITH CORONARY ANGIOGRAM N/A 01/18/2013   Procedure: LEFT HEART CATHETERIZATION WITH CORONARY ANGIOGRAM;  Surgeon: Corky Crafts, MD;  Location[facility] CATH LAB;  Service: Cardiovascular;  Laterality: N/A;  . LEFT HEART CATHETERIZATION WITH CORONARY ANGIOGRAM N/A 04/14/2013   Procedure: LEFT HEART CATHETERIZATION WITH CORONARY ANGIOGRAM;  Surgeon: Corky Crafts, MD;  Location: [facility] CATH LAB;  Service: Cardiovascular;  Laterality: N/A;  . PERCUTANEOUS CORONARY STENT INTERVENTION (PCI-S)  01/18/2013   Procedure: PERCUTANEOUS  CORONARY STENT INTERVENTION (PCI-S);  Surgeon: Corky Crafts, MD;  Location: [facility] CATH LAB;  Service: Cardiovascular;;  . TEE WITHOUT CARDIOVERSION N/A 03/06/2014   Procedure: TRANSESOPHAGEAL ECHOCARDIOGRAM (TEE);  Surgeon: Lars Masson, MD;  Location: [facility] ENDOSCOPY;  Service: Cardiovascular;  Laterality: N/A;  . TUBAL LIGATION      Family History  Problem Relation Age of Onset  . CAD Mother 57  . Lung cancer Mother   . Bladder Cancer Mother   . Stroke Mother   . Heart disease Mother        Before age 20  . Hypertension Mother   . Heart attack Mother   . CAD Father 29  . Heart disease Father        Before age 75  . Heart attack Father   . Stroke Father        Bleeding stroke    Social History   Social History  . Marital status: Single    Spouse name: N/A  . Number of children: N/A  . Years of education: N/A   Occupational History  . Not on file.   Social History Main Topics  . Smoking status: Former Smoker    Packs/day: 0.50    Types: Cigarettes    Quit date: 08/19/2015  . Smokeless tobacco: Never Used     Comment: i QUIT SMOKING A YEAR AGO, i DONT REMEMBER WHAT MONTH  .  Alcohol use No  . Drug use: Yes    Types: Marijuana  . Sexual activity: Yes    Birth control/ protection: None   Other Topics Concern  . Not on file   Social History Narrative  . No narrative on file    Prior to Admission medications   Medication Sig Start Date End Date Taking? Authorizing Provider  acetaminophen (TYLENOL) 325 MG tablet Take 650 mg by mouth every 6 (six) hours as needed for moderate pain.    Yes [provider]  aspirin 81 MG tablet Take 1 tablet (81 mg total) by mouth daily. 10/08/12  Yes Christiane Ha, MD  atorvastatin (LIPITOR) 80 MG tablet Take 1 tablet (80 mg total) by mouth daily. 11/09/14  Yes Corky Crafts, MD  carvedilol (COREG) 6.25 MG tablet Take 6.25 mg by mouth 2 (two) times daily with a meal.   Yes [provider]    clopidogrel (PLAVIX) 75 MG tablet Take 1 tablet (75 mg total) by mouth daily with breakfast. 04/11/16  Yes Corky Crafts, MD  furosemide (LASIX) 20 MG tablet Take 1 tablet (20 mg total) by mouth daily as needed for fluid. Patient taking differently: Take 20-40 mg by mouth daily as needed for fluid.  02/08/15  Yes Corky Crafts, MD  isosorbide mononitrate (IMDUR) 30 MG 24 hr tablet Take 1 tablet (30 mg total) by mouth daily. 11/09/14  Yes Corky Crafts, MD  lisinopril (PRINIVIL,ZESTRIL) 10 MG tablet Take 10 mg by mouth daily.   Yes [provider]  potassium chloride (K-DUR) 10 MEQ tablet Take 1 tablet (10 mEq total) by mouth daily. 11/09/14  Yes Corky Crafts, MD  docusate sodium (COLACE) 100 MG capsule Take 100 mg by mouth 2 (two) times daily as needed for mild constipation.    [provider]  nitroGLYCERIN (NITROSTAT) 0.4 MG SL tablet Place 1 tablet (0.4 mg total) under the tongue every 5 (five) minutes x 3 doses as needed for chest pain. 10/27/13   Corky Crafts, MD    Current Facility-Administered Medications  Medication Dose Route Frequency Provider Last Rate Last Dose  . 0.9 %  sodium chloride infusion  250 mL Intravenous PRN Precious Reel, MD 10 mL/hr at 08/18/16 1159 250 mL at 08/18/16 1159  . acetaminophen (TYLENOL) tablet 650 mg  650 mg Oral Q4H PRN Precious Reel, MD      . albuterol (PROVENTIL) (2.5 MG/3ML) 0.083% nebulizer solution 2.5 mg  2.5 mg Nebulization Q6H PRN Strader, Brittany M, PA-C      . aspirin EC tablet 81 mg  81 mg Oral Daily Precious Reel, MD   81 mg at 08/18/16 1350  . atorvastatin (LIPITOR) tablet 80 mg  80 mg Oral Daily Precious Reel, MD   80 mg at 08/18/16 1350  . carvedilol (COREG) tablet 6.25 mg  6.25 mg Oral BID WC Precious Reel, MD   6.25 mg at 08/17/16 1649  . chlorhexidine (PERIDEX) 0.12 % solution 15 mL  15 mL Mouth/Throat BID Purcell Nails, MD      .  diphenhydrAMINE (BENADRYL) capsule 25 mg  25 mg Oral Q8H PRN Ellsworth Lennox, PA-C   25 mg at 08/17/16 2026  . docusate sodium (COLACE) capsule 100 mg  100 mg Oral BID PRN Precious Reel, MD      . enoxaparin (LOVENOX) injection 40 mg  40 mg Subcutaneous Q24H Precious Reel, MD   40 mg  at 08/18/16 1340  . hydrocortisone cream 0.5 %   Topical TID PRN Abelino Derrick, PA-C      . ondansetron [facility]) injection 4 mg  4 mg Intravenous Q6H PRN Precious Reel, MD      . potassium chloride (K-DUR,KLOR-CON) CR tablet 10 mEq  10 mEq Oral Daily Precious Reel, MD   10 mEq at 08/18/16 1350  . sodium chloride flush (NS) 0.9 % injection 3 mL  3 mL Intravenous Q12H Precious Reel, MD   3 mL at 08/18/16 1353  . sodium chloride flush (NS) 0.9 % injection 3 mL  3 mL Intravenous PRN Precious Reel, MD      . traMADol Janean Sark) tablet 50 mg  50 mg Oral Q6H PRN Ellsworth Lennox, PA-C   50 mg at 08/17/16 2206    Allergies  Allergen Reactions  . Brilinta [Ticagrelor] Other (See Comments)    Dyspnea  . Other Other (See Comments)    BP med (unsure of name) BP bottomed out  . Dye Fdc Red [Red Dye] Other (See Comments)    Allergic to dye that is used in nuclear stress test. Pt sates makes her feel like her blood is boiling and itching  . Penicillins Rash      Review of Systems:   General:  normal appetite, decreased energy, no weight gain, no weight loss, no fever  Cardiac:  no chest pain with exertion, no chest pain at rest, +SOB with exertion, + intermittent resting SOB, + PND, + orthopnea, no palpitations, no arrhythmia, no atrial fibrillation, no LE edema, + dizzy spells, no syncope  Respiratory:  +  shortness of breath, no home oxygen, no productive cough, no dry cough, no bronchitis, + wheezing, no hemoptysis, no asthma, no pain with inspiration or cough, no sleep apnea, no CPAP at night  GI:   no difficulty swallowing, no reflux, no frequent heartburn,  no hiatal hernia, no abdominal pain, + constipation, no diarrhea, no hematochezia, no hematemesis, no melena  GU:   no dysuria,  no frequency, no urinary tract infection, no hematuria, no kidney stones, no kidney disease  Vascular:  no pain suggestive of claudication, no pain in feet, no leg cramps, no varicose veins, no DVT, no non-healing foot ulcer  Neuro:   + stroke in past noted on brain CT - subclinical, no TIA's, no seizures, no headaches, no temporary blindness one eye,  no slurred speech, no peripheral neuropathy, no chronic pain, no instability of gait, no memory/cognitive dysfunction  Musculoskeletal: no arthritis, no joint swelling, no myalgias, no difficulty walking, normal mobility   Skin:   no rash, no itching, no skin infections, no pressure sores or ulcerations  Psych:   no anxiety, no depression, no nervousness, no unusual recent stress  Eyes:   no blurry vision, no floaters, no recent vision changes, + wears glasses for reading  ENT:   no hearing loss, + loose or painful teeth, no dentures, last saw dentist many years ago  Hematologic:  no easy bruising, no abnormal bleeding, no clotting disorder, no frequent epistaxis  Endocrine:  no diabetes, does not check CBG's at home     Physical Exam:   BP (!) 92/44   Pulse 72   Temp 98 F (36.7 C) (Oral)   Resp 12   Ht 5\' 1"  (1.549 m)   Wt 125 lb 12.8 oz (57.1 kg)   SpO2 97%   BMI 23.77 kg/m   General:  Female  NAD - appears older than stated age  HEENT:  Unremarkable but poor dentition  Neck:   no JVD, no bruits, no adenopathy   Chest:   clear to auscultation, symmetrical breath sounds, no wheezes, no rhonchi   CV:   RRR, grade III/VI holosystolic murmur best LLSB  Abdomen:  soft, non-tender, no masses   Extremities:  warm, well-perfused, pulses palpable, no lower extremity edema  Rectal/GU  Deferred  Neuro:   Grossly non-focal and symmetrical throughout  Skin:   Clean and dry, no rashes, no breakdown  Diagnostic  Tests:  Transthoracic Echocardiography  (Report amended )  Patient:    Klaryssa, Fauth MR #:       [id number] Study Date: 08/17/2016 Gender:     F Age:        51 Height:     154.9 cm Weight:     58.2 kg BSA:        1.59 m^2 Pt. Status: Room:       3W23C   ADMITTING    Nanetta Batty, MD  ATTENDING    Tobias Alexander, M.D.  PERFORMING   [facility], Inpatient  SONOGRAPHER  [facility]  ORDERING     Aniceto [facility]  REFERRING    Precious Reel  cc:  ------------------------------------------------------------------- LV EF: 35% -   40%  ------------------------------------------------------------------- History:   PMH:  Mitral valve insufficiency.  Coronary artery disease.  Risk factors:  Hypertension. Dyslipidemia.  ------------------------------------------------------------------- Study Conclusions  - Left ventricle: The cavity size was normal. Wall thickness was   normal. Systolic function was moderately reduced. The estimated   ejection fraction was in the range of 35% to 40%. Moderate   diffuse hypokinesis with no identifiable regional variations.   Features are consistent with a pseudonormal left ventricular   filling pattern, with concomitant abnormal relaxation and   increased filling pressure (grade 2 diastolic dysfunction). - Aortic valve: There was mild regurgitation. - Mitral valve: There was severe regurgitation directed centrally.   Severe regurgitation is suggested by pulmonary vein systolic flow   reversal. - Left atrium: The atrium was mildly dilated. - Atrial septum: No defect or patent foramen ovale was identified.  Impressions:  - Compared to 2016, left ventricular systolic function has   deteriorated and mitral insufficiency has worsened.  ------------------------------------------------------------------- Study data:  The previous study was not available, so comparison was made to the report of 01/09/2014.  Study  status:  Routine. Procedure:  Transthoracic echocardiography. Image quality was adequate.  Study completion:  There were no complications. Transthoracic echocardiography.  M-mode, complete 2D, spectral Doppler, and color Doppler.  Birthdate:  Patient birthdate: [birth year].  Age:  Patient is 51 yr old.  Sex:  Gender: female. BMI: 24.3 kg/m^2.  Blood pressure:     113/73  Patient status: Inpatient.  Study date:  Study date: 08/17/2016. Study time: 10:47 AM.  Location:  Bedside.  -------------------------------------------------------------------  ------------------------------------------------------------------- Left ventricle:  The cavity size was normal. Wall thickness was normal. Systolic function was moderately reduced. The estimated ejection fraction was in the range of 35% to 40%.  Moderate diffuse hypokinesis with no identifiable regional variations. Features are consistent with a pseudonormal left ventricular filling pattern, with concomitant abnormal relaxation and increased filling pressure (grade 2 diastolic dysfunction).  ------------------------------------------------------------------- Aortic valve:   Structurally normal valve. Trileaflet. Cusp separation was normal.  Doppler:  Transvalvular velocity was within the normal range. There was no stenosis. There was mild regurgitation.  ------------------------------------------------------------------- Aorta:  Aortic root: The  aortic root was normal in size. Ascending aorta: The ascending aorta was normal in size.  ------------------------------------------------------------------- Mitral valve:   Mildly thickened leaflets . Leaflet separation was normal.  Doppler:  Transvalvular velocity was within the normal range. There was no evidence for stenosis. There was severe regurgitation directed centrally. Severe regurgitation is suggested by pulmonary vein systolic flow reversal.    Valve area by pressure half-time:  2.89 cm^2. Indexed valve area by pressure half-time: 1.81 cm^2/m^2. Indexed valve area by continuity equation (using LVOT flow): 0.53 cm^2/m^2.    Mean gradient (D): 3 mm Hg. Peak gradient (D): 6 mm Hg.  ------------------------------------------------------------------- Left atrium:  The atrium was mildly dilated.  ------------------------------------------------------------------- Atrial septum:  No defect or patent foramen ovale was identified.   ------------------------------------------------------------------- Right ventricle:  The cavity size was normal. Systolic function was normal.  ------------------------------------------------------------------- Pulmonic valve:    Structurally normal valve.   Cusp separation was normal.  Doppler:  Transvalvular velocity was within the normal range. There was trivial regurgitation.  ------------------------------------------------------------------- Tricuspid valve:   Structurally normal valve.   Leaflet separation was normal.  Doppler:  Transvalvular velocity was within the normal range. There was mild regurgitation.  ------------------------------------------------------------------- Pulmonary artery:   Systolic pressure was within the normal range.   ------------------------------------------------------------------- Right atrium:  The atrium was normal in size.  ------------------------------------------------------------------- Pericardium:  There was no pericardial effusion.  ------------------------------------------------------------------- Systemic veins: Inferior vena cava: The vessel was dilated. The respirophasic diameter changes were blunted (< 50%), consistent with elevated central venous pressure.  ------------------------------------------------------------------- Measurements   Left ventricle                           Value          Reference  LV ID, ED, PLAX chordal                  50.95 mm       43  - 52  LV ID, ES, PLAX chordal          (H)     41.26 mm       23 - 38  LV fx shortening, PLAX chordal   (L)     19    %        >=29  LV PW thickness, ED                      10.4  mm       ----------  IVS/LV PW ratio, ED                      0.99           <=1.3  Stroke volume, 2D                        31    ml       ----------  Stroke volume/bsa, 2D                    19    ml/m^2   ----------  LV ejection fraction, 1-p A4C            39    %        ----------  LV end-diastolic volume, 2-p             117   ml       ----------  LV end-systolic volume, 2-p              69    ml       ----------  LV ejection fraction, 2-p                41    %        ----------  Stroke volume, 2-p                       48    ml       ----------  LV end-diastolic volume/bsa, 2-p         73    ml/m^2   ----------  LV end-systolic volume/bsa, 2-p          43    ml/m^2   ----------  Stroke volume/bsa, 2-p                   30.1  ml/m^2   ----------  LV e&', lateral                           6.31  cm/s     ----------  LV E/e&', lateral                         19.49          ----------  LV e&', medial                            5.11  cm/s     ----------  LV E/e&', medial                          24.07          ----------  LV e&', average                           5.71  cm/s     ----------  LV E/e&', average                         21.54          ----------    Ventricular septum                       Value          Reference  IVS thickness, ED                        10.3  mm       ----------    LVOT                                     Value          Reference  LVOT ID, S                               16    mm       ----------  LVOT area  2.01  cm^2     ----------  LVOT peak velocity, S                    96    cm/s     ----------  LVOT mean velocity, S                    58.8  cm/s     ----------  LVOT VTI, S                              15.5  cm       ----------    Aortic  valve                             Value          Reference  Aortic regurg pressure half-time         371   ms       ----------    Aorta                                    Value          Reference  Aortic root ID, ED                       25    mm       ----------  Ascending aorta ID, A-P, S               24    mm       ----------    Left atrium                              Value          Reference  LA ID, A-P, ES                           45    mm       ----------  LA ID/bsa, A-P                   (H)     2.82  cm/m^2   <=2.2  LA volume, S                             76.3  ml       ----------  LA volume/bsa, S                         47.9  ml/m^2   ----------  LA volume, ES, 1-p A4C                   68.5  ml       ----------  LA volume/bsa, ES, 1-p A4C               43    ml/m^2   ----------  LA volume, ES, 1-p A2C                   78    ml       ----------  LA volume/bsa, ES, 1-p A2C               48.9  ml/m^2   ----------    Mitral valve                             Value          Reference  Mitral E-wave peak velocity              123   cm/s     ----------  Mitral A-wave peak velocity              52.6  cm/s     ----------  Mitral mean velocity, D                  78.2  cm/s     ----------  Mitral deceleration time                 176   ms       150 - 230  Mitral pressure half-time                76    ms       ----------  Mitral mean gradient, D                  3     mm Hg    ----------  Mitral peak gradient, D                  6     mm Hg    ----------  Mitral E/A ratio, peak                   2.4            ----------  Mitral valve area, PHT, DP               2.89  cm^2     ----------  Mitral valve area/bsa, PHT, DP           1.81  cm^2/m^2 ----------  Mitral valve area/bsa, LVOT              0.53  cm^2/m^2 ----------  continuity  Mitral annulus VTI, D                    36.9  cm       ----------  Mitral regurg VTI, PISA                  148   cm       ----------  Mitral ERO,  PISA                         0.32  cm^2     ----------  Mitral regurg volume, PISA               47    ml       ----------    Pulmonary arteries                       Value          Reference  PA pressure, S, DP                       29    mm Hg    <=30  Tricuspid valve                          Value          Reference  Tricuspid regurg peak velocity           255   cm/s     ----------  Tricuspid peak RV-RA gradient            26    mm Hg    ----------    Right atrium                             Value          Reference  RA ID, S-I, ES, A4C                      46.7  mm       34 - 49  RA area, ES, A4C                         11    cm^2     8.3 - 19.5  RA volume, ES, A/L                       20.8  ml       ----------  RA volume/bsa, ES, A/L                   13    ml/m^2   ----------    Systemic veins                           Value          Reference  Estimated CVP                            3     mm Hg    ----------    Right ventricle                          Value          Reference  TAPSE                                    20.7  mm       ----------  RV pressure, S, DP                       29    mm Hg    <=30  RV s&', lateral, S                        9.55  cm/s     ----------  Legend: (L)  and  (H)  mark values outside specified reference range.  ------------------------------------------------------------------- Lenard Simmer, MD 2018-08-16T15:33:59   Transesophageal Echocardiogram Note  MONISE ASATO [id number] 06-May-1965  Procedure: Transesophageal Echocardiogram Indications: mitral regurgitation  Procedure Details Consent: Obtained Time Out: Verified patient identification, verified procedure, site/side was marked, verified correct patient position, special equipment/implants available, Radiology Safety Procedures followed,  medications/allergies/relevent history reviewed, required imaging and test results available.  Performed  Medications:  During this procedure the patient is administered a total of Versed 4 mg and Fentanyl 25 mcg  to achieve and maintain moderate conscious sedation.  The patient's heart rate, blood pressure, and oxygen saturation are monitored continuously during the procedure. The period of conscious sedation is 30 minutes, of which I was present face-to-face 100% of this time.  Moderate global reduction in LV systolic function; moderate LAE; mild AI; mildly restricted posterior leaflet; severe, central MR;  full report to follow.   Complications: No apparent complications Patient did tolerate procedure well.  Olga Millers, MD      EKG: NSR w/out acute ischemic changes, LBBB (old)    Impression:  Patient has stage D severe symptomatic mitral regurgitation. I have personally reviewed her recent transthoracic and transesophageal echocardiograms. There is at least moderate ventricular systolic dysfunction. Functional anatomy of the mitral valve appears consistent with primarily type IIIB functional restriction of the posterior leaflet likely secondary to ischemic cardiomyopathy. There is some thickening of the subvalvular apparatus and leaflets, particular close to the anterior commissure, so the possibility of some degree of type IIIa dysfunction exists. There is some annular dilatation. The patient has clearly failed medical therapy with chronic symptoms dating back to her heart attack in 2015 and numerous hospitalizations for acute exacerbation of chronic congestive heart failure over the last few years. I agree that surgical intervention is indicated.     Plan:  I discussed matters at length with the patient in her family at the bedside. We discussed the indications, risks, and potential benefits of mitral valve repair or replacement. Alternative treatment strategies have been discussed including continued medical therapy without any type of surgical intervention,  possibility of biventricular pacing, or surgical repair/replacement of the mitral valve.  The possibility of need for concomitant coronary artery bypass grafting has been discussed. In the absence of the need for surgical revascularization, it is possible patient might be candidate for minimally invasive approach for surgery.  All of their questions have been addressed.   As a next step the patient will need to undergo left and right heart catheterization. I have stopped her Plavix. She needs dental service consultation and may need extraction. Pulmonary function tests will be ordered. During the interim period of time her heart failure should be tuned up as much as possible.  I will plan to follow-up again with the patient early next week. Please call us specific questions arise.   I spent in excess of 120 minutes during the conduct of this hospital consultation and >50% of this time involved direct face-to-face encounter for counseling and/or coordination of the patient's care.    Salvatore Decent. Cornelius Moras, MD 08/18/2016 3:21 PM

## 2016-08-18 NOTE — CV Procedure (Signed)
    Transesophageal Echocardiogram Note  Lindsay Camacho [id number] 1965-06-22  Procedure: Transesophageal Echocardiogram Indications: mitral regurgitation  Procedure Details Consent: Obtained Time Out: Verified patient identification, verified procedure, site/side was marked, verified correct patient position, special equipment/implants available, Radiology Safety Procedures followed,  medications/allergies/relevent history reviewed, required imaging and test results available.  Performed  Medications:  During this procedure the patient is administered a total of Versed 4 mg and Fentanyl 25 mcg  to achieve and maintain moderate conscious sedation.  The patient's heart rate, blood pressure, and oxygen saturation are monitored continuously during the procedure. The period of conscious sedation is 30 minutes, of which I was present face-to-face 100% of this time.  Moderate global reduction in LV systolic function; moderate LAE; mild AI; mildly restricted posterior leaflet; severe, central MR;  full report to follow.   Complications: No apparent complications Patient did tolerate procedure well.  Olga Millers, MD

## 2016-08-18 NOTE — Progress Notes (Deleted)
Responded to consult for prayer, but pt not in rm, having tests. Conferred w/ nurse, who noted pt was young to have such serious conditions, but not especially sleepy this morning. Will stop back by later.   08/18/16 1000  Clinical Encounter Type  Visited With Health care provider  Visit Type Initial;Psychological support;Spiritual support;Social support  Referral From Nurse   Josie Burleigh A Keondrick Dilks, Chaplain  

## 2016-08-18 NOTE — H&P (View-Only) (Signed)
DAILY PROGRESS NOTE   Patient Name: Lindsay Camacho Date of Encounter: 08/18/2016  Hospital Problem List   Active Problems:   Mitral regurgitation   Heart failure (HCC)   Shortness of breath    Chief Complaint   No complaints, breathing better  Subjective   Echo yesterday suggests severe MR with reversal of flow in the pulmonary vein. LVEF is worse at 35-40%. She is scheduled for TEE today at 12:00. Suspect she will need further work-up for for MVR - likely repeat cath and CT surgery consult. Autodiuresed 1.2L negative - weight down 3 lbs. Not currently on diuretic.  Objective   Vitals:   08/18/16 0100 08/18/16 0531 08/18/16 0700 08/18/16 0900  BP:  112/72 115/78 107/62  Pulse: 70 83 68 73  Resp: 17 14    Temp:  98.2 F (36.8 C)    TempSrc:  Oral    SpO2:  98% 97% 98%  Weight:  125 lb 12.8 oz (57.1 kg)    Height:        Intake/Output Summary (Last 24 hours) at 08/18/16 0959 Last data filed at 08/18/16 0700  Gross per 24 hour  Intake              440 ml  Output             1700 ml  Net            -1260 ml   Filed Weights   08/17/16 0100 08/18/16 0531  Weight: 128 lb 6.4 oz (58.2 kg) 125 lb 12.8 oz (57.1 kg)    Physical Exam   General appearance: alert and no distress Lungs: clear to auscultation bilaterally Heart: regular rate and rhythm, S1, S2 normal, systolic murmur: early systolic 3/6, blowing at apex and diastolic murmur: early diastolic 2/6, crescendo at 2nd right intercostal space Extremities: extremities normal, atraumatic, no cyanosis or edema Neurologic: Grossly normal  Inpatient Medications    Scheduled Meds: . aspirin EC  81 mg Oral Daily  . atorvastatin  80 mg Oral Daily  . carvedilol  6.25 mg Oral BID WC  . clopidogrel  75 mg Oral Daily  . enoxaparin (LOVENOX) injection  40 mg Subcutaneous Q24H  . potassium chloride  10 mEq Oral Daily  . sodium chloride flush  3 mL Intravenous Q12H    Continuous Infusions: . sodium chloride       PRN Meds: sodium chloride, acetaminophen, albuterol, diphenhydrAMINE, docusate sodium, hydrocortisone cream, ondansetron (ZOFRAN) IV, sodium chloride flush, traMADol   Labs   Results for orders placed or performed during the hospital encounter of 08/17/16 (from the past 48 hour(s))  HIV antibody (Routine Testing)     Status: None   Collection Time: 08/17/16  3:45 AM  Result Value Ref Range   HIV Screen 4th Generation wRfx Non Reactive Non Reactive    Comment: (NOTE) Performed At: [facility] [address] [place], [place] [id number] Mila Homer MD [id number]   CBC     Status: None   Collection Time: 08/17/16  3:45 AM  Result Value Ref Range   WBC 5.8 4.0 - 10.5 K/uL   RBC 4.25 3.87 - 5.11 MIL/uL   Hemoglobin 12.1 12.0 - 15.0 g/dL   HCT 66.5 99.3 - 57.0 %   MCV 87.3 78.0 - 100.0 fL   MCH 28.5 26.0 - 34.0 pg   MCHC 32.6 30.0 - 36.0 g/dL   RDW 17.7 93.9 - 03.0 %   Platelets 212 150 -  400 K/uL  Creatinine, serum     Status: None   Collection Time: 08/17/16  3:45 AM  Result Value Ref Range   Creatinine, Ser 0.87 0.44 - 1.00 mg/dL   GFR calc non Af Amer >60 >60 mL/min   GFR calc Af Amer >60 >60 mL/min    Comment: (NOTE) The eGFR has been calculated using the CKD EPI equation. This calculation has not been validated in all clinical situations. eGFR's persistently <60 mL/min signify possible Chronic Kidney Disease.   Basic metabolic panel     Status: Abnormal   Collection Time: 08/18/16  4:27 AM  Result Value Ref Range   Sodium 138 135 - 145 mmol/L   Potassium 4.0 3.5 - 5.1 mmol/L   Chloride 103 101 - 111 mmol/L   CO2 28 22 - 32 mmol/L   Glucose, Bld 87 65 - 99 mg/dL   BUN 14 6 - 20 mg/dL   Creatinine, Ser 0.93 0.44 - 1.00 mg/dL   Calcium 8.7 (L) 8.9 - 10.3 mg/dL   GFR calc non Af Amer >60 >60 mL/min   GFR calc Af Amer >60 >60 mL/min    Comment: (NOTE) The eGFR has been calculated using the CKD EPI equation. This calculation has not been validated  in all clinical situations. eGFR's persistently <60 mL/min signify possible Chronic Kidney Disease.    Anion gap 7 5 - 15  Brain natriuretic peptide     Status: Abnormal   Collection Time: 08/18/16  4:27 AM  Result Value Ref Range   B Natriuretic Peptide 252.1 (H) 0.0 - 100.0 pg/mL    ECG   N/A - Personally Reviewed  Telemetry   NSR - Personally Reviewed  Radiology    Dg Chest Port 1 View  Result Date: 08/17/2016 CLINICAL DATA:  Shortness of breath; history of CHF and mitral regurgitation, current smoker. EXAM: PORTABLE CHEST 1 VIEW COMPARISON:  Portable chest x-ray of September 17, 2014 FINDINGS: The lungs are well-expanded and clear. The heart is mildly enlarged. The pulmonary vascularity is normal. The mediastinum is normal in width. The bony thorax exhibits no acute abnormality. IMPRESSION: Mild cardiomegaly without evidence of pulmonary edema. No acute cardiopulmonary abnormality is observed. Electronically Signed   By: David  Martinique M.D.   On: 08/17/2016 15:41    Cardiac Studies   Study Conclusions  - Left ventricle: The cavity size was normal. Wall thickness was   normal. Systolic function was moderately reduced. The estimated   ejection fraction was in the range of 35% to 40%. Moderate   diffuse hypokinesis with no identifiable regional variations.   Features are consistent with a pseudonormal left ventricular   filling pattern, with concomitant abnormal relaxation and   increased filling pressure (grade 2 diastolic dysfunction). - Aortic valve: There was mild regurgitation. - Mitral valve: There was severe regurgitation directed centrally.   Severe regurgitation is suggested by pulmonary vein systolic flow   reversal. - Left atrium: The atrium was mildly dilated. - Atrial septum: No defect or patent foramen ovale was identified.  Impressions:  - Compared to 2016, left ventricular systolic function has   deteriorated and mitral insufficiency has  worsened.  Assessment   1. Active Problems: 2.   Mitral regurgitation 3.   Heart failure ([place]) 4.   Shortness of breath 5.   Plan   1. Plan for TEE today - will obtain CT surgery consult afterwards and make a plan for further work-up of mitral valve repair or replacement.  Time  Spent Directly with Patient:  I have spent a total of 15 minutes with the patient reviewing hospital notes, telemetry, EKGs, labs and examining the patient as well as establishing an assessment and plan that was discussed personally with the patient. > 50% of time was spent in direct patient care.  Length of Stay:  LOS: 1 day   Pixie Casino, MD, [place]  Attending Cardiologist  Direct Dial: [phone]  Fax: [phone]  Website:  [email or web address] 08/18/2016, 9:59 AM

## 2016-08-18 NOTE — Progress Notes (Signed)
DAILY PROGRESS NOTE   Patient Name: Lindsay Camacho Date of Encounter: 08/18/2016  Hospital Problem List   Active Problems:   Mitral regurgitation   Heart failure (HCC)   Shortness of breath    Chief Complaint   No complaints, breathing better  Subjective   Echo yesterday suggests severe MR with reversal of flow in the pulmonary vein. LVEF is worse at 35-40%. She is scheduled for TEE today at 12:00. Suspect she will need further work-up for for MVR - likely repeat cath and CT surgery consult. Autodiuresed 1.2L negative - weight down 3 lbs. Not currently on diuretic.  Objective   Vitals:   08/18/16 0100 08/18/16 0531 08/18/16 0700 08/18/16 0900  BP:  112/72 115/78 107/62  Pulse: 70 83 68 73  Resp: 17 14    Temp:  98.2 F (36.8 C)    TempSrc:  Oral    SpO2:  98% 97% 98%  Weight:  125 lb 12.8 oz (57.1 kg)    Height:        Intake/Output Summary (Last 24 hours) at 08/18/16 0959 Last data filed at 08/18/16 0700  Gross per 24 hour  Intake              440 ml  Output             1700 ml  Net            -1260 ml   Filed Weights   08/17/16 0100 08/18/16 0531  Weight: 128 lb 6.4 oz (58.2 kg) 125 lb 12.8 oz (57.1 kg)    Physical Exam   General appearance: alert and no distress Lungs: clear to auscultation bilaterally Heart: regular rate and rhythm, S1, S2 normal, systolic murmur: early systolic 3/6, blowing at apex and diastolic murmur: early diastolic 2/6, crescendo at 2nd right intercostal space Extremities: extremities normal, atraumatic, no cyanosis or edema Neurologic: Grossly normal  Inpatient Medications    Scheduled Meds: . aspirin EC  81 mg Oral Daily  . atorvastatin  80 mg Oral Daily  . carvedilol  6.25 mg Oral BID WC  . clopidogrel  75 mg Oral Daily  . enoxaparin (LOVENOX) injection  40 mg Subcutaneous Q24H  . potassium chloride  10 mEq Oral Daily  . sodium chloride flush  3 mL Intravenous Q12H    Continuous Infusions: . sodium chloride       PRN Meds: sodium chloride, acetaminophen, albuterol, diphenhydrAMINE, docusate sodium, hydrocortisone cream, ondansetron (ZOFRAN) IV, sodium chloride flush, traMADol   Labs   Results for orders placed or performed during the hospital encounter of 08/17/16 (from the past 48 hour(s))  HIV antibody (Routine Testing)     Status: None   Collection Time: 08/17/16  3:45 AM  Result Value Ref Range   HIV Screen 4th Generation wRfx Non Reactive Non Reactive    Comment: (NOTE) Performed At: [facility] [place], [place] [id number] Lindon Romp MD [id number]   CBC     Status: None   Collection Time: 08/17/16  3:45 AM  Result Value Ref Range   WBC 5.8 4.0 - 10.5 K/uL   RBC 4.25 3.87 - 5.11 MIL/uL   Hemoglobin 12.1 12.0 - 15.0 g/dL   HCT 37.1 36.0 - 46.0 %   MCV 87.3 78.0 - 100.0 fL   MCH 28.5 26.0 - 34.0 pg   MCHC 32.6 30.0 - 36.0 g/dL   RDW 14.1 11.5 - 15.5 %   Platelets 212 150 -  400 K/uL  Creatinine, serum     Status: None   Collection Time: 08/17/16  3:45 AM  Result Value Ref Range   Creatinine, Ser 0.87 0.44 - 1.00 mg/dL   GFR calc non Af Amer >60 >60 mL/min   GFR calc Af Amer >60 >60 mL/min    Comment: (NOTE) The eGFR has been calculated using the CKD EPI equation. This calculation has not been validated in all clinical situations. eGFR's persistently <60 mL/min signify possible Chronic Kidney Disease.   Basic metabolic panel     Status: Abnormal   Collection Time: 08/18/16  4:27 AM  Result Value Ref Range   Sodium 138 135 - 145 mmol/L   Potassium 4.0 3.5 - 5.1 mmol/L   Chloride 103 101 - 111 mmol/L   CO2 28 22 - 32 mmol/L   Glucose, Bld 87 65 - 99 mg/dL   BUN 14 6 - 20 mg/dL   Creatinine, Ser 0.93 0.44 - 1.00 mg/dL   Calcium 8.7 (L) 8.9 - 10.3 mg/dL   GFR calc non Af Amer >60 >60 mL/min   GFR calc Af Amer >60 >60 mL/min    Comment: (NOTE) The eGFR has been calculated using the CKD EPI equation. This calculation has not been validated  in all clinical situations. eGFR's persistently <60 mL/min signify possible Chronic Kidney Disease.    Anion gap 7 5 - 15  Brain natriuretic peptide     Status: Abnormal   Collection Time: 08/18/16  4:27 AM  Result Value Ref Range   B Natriuretic Peptide 252.1 (H) 0.0 - 100.0 pg/mL    ECG   N/A - Personally Reviewed  Telemetry   NSR - Personally Reviewed  Radiology    Dg Chest Port 1 View  Result Date: 08/17/2016 CLINICAL DATA:  Shortness of breath; history of CHF and mitral regurgitation, current smoker. EXAM: PORTABLE CHEST 1 VIEW COMPARISON:  Portable chest x-ray of September 17, 2014 FINDINGS: The lungs are well-expanded and clear. The heart is mildly enlarged. The pulmonary vascularity is normal. The mediastinum is normal in width. The bony thorax exhibits no acute abnormality. IMPRESSION: Mild cardiomegaly without evidence of pulmonary edema. No acute cardiopulmonary abnormality is observed. Electronically Signed   By: David  Martinique M.D.   On: 08/17/2016 15:41    Cardiac Studies   Study Conclusions  - Left ventricle: The cavity size was normal. Wall thickness was   normal. Systolic function was moderately reduced. The estimated   ejection fraction was in the range of 35% to 40%. Moderate   diffuse hypokinesis with no identifiable regional variations.   Features are consistent with a pseudonormal left ventricular   filling pattern, with concomitant abnormal relaxation and   increased filling pressure (grade 2 diastolic dysfunction). - Aortic valve: There was mild regurgitation. - Mitral valve: There was severe regurgitation directed centrally.   Severe regurgitation is suggested by pulmonary vein systolic flow   reversal. - Left atrium: The atrium was mildly dilated. - Atrial septum: No defect or patent foramen ovale was identified.  Impressions:  - Compared to 2016, left ventricular systolic function has   deteriorated and mitral insufficiency has  worsened.  Assessment   1. Active Problems: 2.   Mitral regurgitation 3.   Heart failure ([place]) 4.   Shortness of breath 5.   Plan   1. Plan for TEE today - will obtain CT surgery consult afterwards and make a plan for further work-up of mitral valve repair or replacement.  Time  Spent Directly with Patient:  I have spent a total of 15 minutes with the patient reviewing hospital notes, telemetry, EKGs, labs and examining the patient as well as establishing an assessment and plan that was discussed personally with the patient. > 50% of time was spent in direct patient care.  Length of Stay:  LOS: 1 day   Pixie Casino, MD, [place]  Attending Cardiologist  Direct Dial: [phone]  Fax: [phone]  Website:  [email or web address] 08/18/2016, 9:59 AM

## 2016-08-18 NOTE — Progress Notes (Signed)
Responded to consult to create advanced directive. Explained/discussed all options on form to knowledgeable pt who's awaiting heart surgery for leaky valve w/ med options still to decide. She does not wish to be hooked to machines if her mind is gone, but she doesn't want "DNR" if that means they won't try to resuscitate her when, if they did, she might recover and be ok. Recommended that she discuss her wishes w/ her cardiologist/surgeon and work out w/ her med staff the precise language she wishes to have written on the advanced directive form that would be clear as from one doctor to another the extent of her wishes. She will.  Provided spiritual/emotional support, ministry of presence to this delightful lady who had one prior heart attack and is glad she's at [facility] for this pending surgery. She told her daughter she's approaching it w/ the attitude that she's a winner either way: If she has it and survives, she'll see her family again, and if not, she'll be in heaven. Her religious affiliation is [place]. Advised pt she can ask nurse to page for a chaplain at any time.   08/18/16 1000  Clinical Encounter Type  Visited With Patient;Health care provider  Visit Type Initial;Psychological support;Spiritual support;Social support;Pre-op;Critical Care  Referral From Nurse  Spiritual Encounters  Spiritual Needs Prayer;Emotional  Stress Factors  Patient Stress Factors Health changes;Loss of control   Ephraim Hamburger, [address]

## 2016-08-18 NOTE — Interval H&P Note (Signed)
History and Physical Interval Note:  08/18/2016 12:04 PM  Lindsay Camacho  has presented today for surgery, with the diagnosis of SEVERE MR  The various methods of treatment have been discussed with the patient and family. After consideration of risks, benefits and other options for treatment, the patient has consented to  Procedure(s): TRANSESOPHAGEAL ECHOCARDIOGRAM (TEE) (N/A) as a surgical intervention .  The patient's history has been reviewed, patient examined, no change in status, stable for surgery.  I have reviewed the patient's chart and labs.  Questions were answered to the patient's satisfaction.     Olga Millers

## 2016-08-19 DIAGNOSIS — I5023 Acute on chronic systolic (congestive) heart failure: Secondary | ICD-10-CM | POA: Diagnosis present

## 2016-08-19 LAB — COMPREHENSIVE METABOLIC PANEL
ALBUMIN: 3.4 g/dL — AB (ref 3.5–5.0)
ALK PHOS: 72 U/L (ref 38–126)
ALT: 21 U/L (ref 14–54)
ANION GAP: 8 (ref 5–15)
AST: 32 U/L (ref 15–41)
BILIRUBIN TOTAL: 0.6 mg/dL (ref 0.3–1.2)
BUN: 11 mg/dL (ref 6–20)
CALCIUM: 8.7 mg/dL — AB (ref 8.9–10.3)
CO2: 27 mmol/L (ref 22–32)
Chloride: 104 mmol/L (ref 101–111)
Creatinine, Ser: 0.95 mg/dL (ref 0.44–1.00)
GFR calc Af Amer: >60 mL/min (ref >60)
GLUCOSE: 93 mg/dL (ref 65–99)
Potassium: 4.2 mmol/L (ref 3.5–5.1)
Sodium: 139 mmol/L (ref 135–145)
TOTAL PROTEIN: 6.7 g/dL (ref 6.5–8.1)

## 2016-08-19 LAB — BLOOD GAS, ARTERIAL
ACID-BASE EXCESS: 1.2 mmol/L (ref 0.0–2.0)
BICARBONATE: 26 mmol/L (ref 20.0–28.0)
Drawn by: 28340
FIO2: 21
O2 SAT: 94.8 %
PH ART: 7.365 (ref 7.350–7.450)
Patient temperature: 98.6
pCO2 arterial: 46.6 mmHg (ref 32.0–48.0)
pO2, Arterial: 77.8 mmHg — ABNORMAL LOW (ref 83.0–108.0)

## 2016-08-19 LAB — HEMOGLOBIN A1C
Hgb A1c MFr Bld: 4.6 % — ABNORMAL LOW (ref 4.8–5.6)
Mean Plasma Glucose: 85.32 mg/dL

## 2016-08-19 LAB — PREALBUMIN: Prealbumin: 18.5 mg/dL (ref 18–38)

## 2016-08-19 MED ORDER — MELATONIN 3 MG PO TABS
6.0000 mg | ORAL_TABLET | Freq: Every evening | ORAL | Status: DC | PRN
  Administered 2016-08-19 – 2016-08-23 (×5): 6 mg via ORAL
  Filled 2016-08-19 (×6): qty 2

## 2016-08-19 MED ORDER — FUROSEMIDE 40 MG PO TABS
40.0000 mg | ORAL_TABLET | Freq: Every day | ORAL | Status: DC
  Administered 2016-08-19 – 2016-08-24 (×4): 40 mg via ORAL
  Filled 2016-08-19 (×4): qty 1

## 2016-08-19 NOTE — Plan of Care (Signed)
Problem: Activity: Goal: Risk for activity intolerance will decrease Outcome: Progressing Pt is able to ambulate to the bathroom with assistance.

## 2016-08-19 NOTE — Progress Notes (Signed)
CARDIAC REHAB PHASE I   PRE:  Rate/Rhythm: 71 nsr  BP:  Sitting: 106/57      SaO2: 97 ra  MODE:  Ambulation: 250 ft   POST:  Rate/Rhythm: 75 nsr  BP:  Sitting: 102/67     SaO2: 95 ra [id number] Patient ambulated in hallway x 1 assist. Slow gait. Patient insisted on walking close to wall so that she could hold on to rail. Verbalized extreme weakness. Standing rest break x 1 for dizziness. Patient did not appear to be short of breath. Ambulatory sats remained >92. Patient states she is also extremely exhausted from lack of sleep. Encouraged patient to ambulate with RW and assistance for safety 3x daily in preparation for possible CT surgery next week. Patient verbalized understanding. Also encouraged patient to sit up in chair as much as possible and to cough/DB. Post ambulation patient to chair with call bell and phone in reach. Will follow.  Lindsay Abdulaziz English PayneRN, BSN 08/19/2016 8:42 AM

## 2016-08-19 NOTE — Progress Notes (Signed)
Pt BP dropped to 76/46 after receiving 40 mg of PO lasix approx 2 hours ago. She voided 1100 cc of urine. Recheck NP is 94/54. MD notified. No new orders at this time. Will continue Close monitoring of patient.  Colleen Can, RN

## 2016-08-19 NOTE — Progress Notes (Signed)
Progress Note  Patient Name: Lindsay Camacho Date of Encounter: 08/19/2016  Primary Cardiologist: Eldridge Dace  Subjective   51 yo with hx of CAD, MR Admitted with CHF symptoms    Inpatient Medications    Scheduled Meds: . aspirin EC  81 mg Oral Daily  . atorvastatin  80 mg Oral Daily  . carvedilol  6.25 mg Oral BID WC  . chlorhexidine  15 mL Mouth/Throat BID  . enoxaparin (LOVENOX) injection  40 mg Subcutaneous Q24H  . potassium chloride  10 mEq Oral Daily  . sodium chloride flush  3 mL Intravenous Q12H   Continuous Infusions: . sodium chloride Stopped (08/18/16 2213)   PRN Meds: sodium chloride, acetaminophen, albuterol, diphenhydrAMINE, docusate sodium, hydrocortisone cream, ondansetron (ZOFRAN) IV, sodium chloride flush, traMADol   Vital Signs    Vitals:   08/18/16 1351 08/18/16 1731 08/18/16 1933 08/19/16 0536  BP: (!) 92/44 (!) 103/49 102/61 100/60  Pulse:  80 77 77  Resp:   20 16  Temp:   97.6 F (36.4 C) 97.9 F (36.6 C)  TempSrc:   Oral Oral  SpO2:   97% 97%  Weight:    130 lb 14.4 oz (59.4 kg)  Height:        Intake/Output Summary (Last 24 hours) at 08/19/16 1003 Last data filed at 08/19/16 0540  Gross per 24 hour  Intake              730 ml  Output              800 ml  Net              -70 ml   Filed Weights   08/17/16 0100 08/18/16 0531 08/19/16 0536  Weight: 128 lb 6.4 oz (58.2 kg) 125 lb 12.8 oz (57.1 kg) 130 lb 14.4 oz (59.4 kg)    Telemetry    NSR  - Personally Reviewed  ECG     NSR  - Personally Reviewed  Physical Exam   GEN: No acute distress.   Neck:  mild increase JVD Cardiac: RRR, 1-2 / 6 systolic murmur ,  LSB, slight radiation to axilla ( murmur is quieter than expected.  GI: Soft, nontender, non-distended  MS: No edema; No deformity. Neuro:  Nonfocal  Psych: Normal affect   Labs    Chemistry Recent Labs Lab 08/17/16 0345 08/18/16 0427 08/19/16 0343  NA  --  138 139  K  --  4.0 4.2  CL  --  103 104  CO2  --  28  27  GLUCOSE  --  87 93  BUN  --  14 11  CREATININE 0.87 0.93 0.95  CALCIUM  --  8.7* 8.7*  PROT  --   --  6.7  ALBUMIN  --   --  3.4*  AST  --   --  32  ALT  --   --  21  ALKPHOS  --   --  72  BILITOT  --   --  0.6  GFRNONAA >60 >60 >60  GFRAA >60 >60 >60  ANIONGAP  --  7 8     Hematology Recent Labs Lab 08/17/16 0345  WBC 5.8  RBC 4.25  HGB 12.1  HCT 37.1  MCV 87.3  MCH 28.5  MCHC 32.6  RDW 14.1  PLT 212    Cardiac EnzymesNo results for input(s): TROPONINI in the last 168 hours. No results for input(s): TROPIPOC in the last 168 hours.  BNP Recent Labs Lab 08/18/16 0427  BNP 252.1*     DDimer No results for input(s): DDIMER in the last 168 hours.   Radiology    Dg Orthopantogram  Result Date: 08/18/2016 CLINICAL DATA:  51 y/o  F; poor dentition. EXAM: ORTHOPANTOGRAM/PANORAMIC COMPARISON:  None. FINDINGS: There are several dental caries within the residual mandibular incisors, canines, and premolars. There is a periapical cyst associated with the right posterior most mandibular premolar. Residual left mandibular molar is absent the crown. IMPRESSION: Odontogenic disease with several dental caries and periapical cysts of the residual posterior most right mandibular tooth. Electronically Signed   By: Mitzi Hansen M.D.   On: 08/18/2016 20:48   Dg Chest Port 1 View  Result Date: 08/17/2016 CLINICAL DATA:  Shortness of breath; history of CHF and mitral regurgitation, current smoker. EXAM: PORTABLE CHEST 1 VIEW COMPARISON:  Portable chest x-ray of September 17, 2014 FINDINGS: The lungs are well-expanded and clear. The heart is mildly enlarged. The pulmonary vascularity is normal. The mediastinum is normal in width. The bony thorax exhibits no acute abnormality. IMPRESSION: Mild cardiomegaly without evidence of pulmonary edema. No acute cardiopulmonary abnormality is observed. Electronically Signed   By: David  [place] M.D.   On: 08/17/2016 15:41    Cardiac  Studies      Patient Profile     51 y.o. female with CAD and severe MR, moderate - severe LV dysfunction   Assessment & Plan    1. Mitral regurgitation:   The patient has been seen by Dr. Cornelius Moras. She clearly has symptomatic mitral regurgitation she'll need a heart catheterization - already scheduled for Monday   Will need dental consult.    2. Acute on chronic systolic congestive heart failure: BP is marginal .   Will give Lasix and follow closely .   Signed, Kristeen Miss, MD  08/19/2016, 10:03 AM

## 2016-08-20 LAB — BASIC METABOLIC PANEL
Anion gap: 8 (ref 5–15)
BUN: 16 mg/dL (ref 6–20)
CHLORIDE: 101 mmol/L (ref 101–111)
CO2: 29 mmol/L (ref 22–32)
Calcium: 8.8 mg/dL — ABNORMAL LOW (ref 8.9–10.3)
Creatinine, Ser: 1.04 mg/dL — ABNORMAL HIGH (ref 0.44–1.00)
GFR calc non Af Amer: >60 mL/min (ref >60)
Glucose, Bld: 101 mg/dL — ABNORMAL HIGH (ref 65–99)
POTASSIUM: 4 mmol/L (ref 3.5–5.1)
SODIUM: 138 mmol/L (ref 135–145)

## 2016-08-20 MED ORDER — ASPIRIN 81 MG PO CHEW
81.0000 mg | CHEWABLE_TABLET | ORAL | Status: AC
  Administered 2016-08-21: 81 mg via ORAL
  Filled 2016-08-20: qty 1

## 2016-08-20 MED ORDER — SODIUM CHLORIDE 0.9 % WEIGHT BASED INFUSION
1.0000 mL/kg/h | INTRAVENOUS | Status: DC
  Administered 2016-08-21: 1 mL/kg/h via INTRAVENOUS

## 2016-08-20 MED ORDER — SODIUM CHLORIDE 0.9% FLUSH: 3.0000 mL | INTRAVENOUS | Status: DC | PRN

## 2016-08-20 MED ORDER — SODIUM CHLORIDE 0.9 % WEIGHT BASED INFUSION
3.0000 mL/kg/h | INTRAVENOUS | Status: DC
  Administered 2016-08-21: 3 mL/kg/h via INTRAVENOUS

## 2016-08-20 MED ORDER — SODIUM CHLORIDE 0.9 % IV SOLN: 250.0000 mL | INTRAVENOUS | Status: DC | PRN

## 2016-08-20 MED ORDER — SODIUM CHLORIDE 0.9% FLUSH
3.0000 mL | Freq: Two times a day (BID) | INTRAVENOUS | Status: DC
  Administered 2016-08-20 – 2016-08-21 (×2): 3 mL via INTRAVENOUS

## 2016-08-20 NOTE — Plan of Care (Signed)
Problem: Safety: Goal: Ability to remain free from injury will improve Outcome: Progressing Patient uses call light appropriately. Ambulates to the restroom.   Problem: Physical Regulation: Goal: Ability to maintain clinical measurements within normal limits will improve Outcome: Progressing Melatonin added to medication regimen this evening. Patient stated it was effective.   Problem: Bowel/Gastric: Goal: Will not experience complications related to bowel motility Outcome: Progressing Patient had one BM yesterday. PRN Colace given this evening.

## 2016-08-20 NOTE — Progress Notes (Signed)
Progress Note  Patient Name: Lindsay Camacho Date of Encounter: 08/20/2016  Primary Cardiologist: Eldridge Dace  Subjective   51 yo with hx of CAD, MR Admitted with CHF symptoms    Inpatient Medications    Scheduled Meds: . aspirin EC  81 mg Oral Daily  . atorvastatin  80 mg Oral Daily  . carvedilol  6.25 mg Oral BID WC  . chlorhexidine  15 mL Mouth/Throat BID  . enoxaparin (LOVENOX) injection  40 mg Subcutaneous Q24H  . furosemide  40 mg Oral Daily  . potassium chloride  10 mEq Oral Daily  . sodium chloride flush  3 mL Intravenous Q12H   Continuous Infusions: . sodium chloride Stopped (08/18/16 2213)   PRN Meds: sodium chloride, acetaminophen, albuterol, diphenhydrAMINE, docusate sodium, hydrocortisone cream, Melatonin, ondansetron (ZOFRAN) IV, sodium chloride flush, traMADol   Vital Signs    Vitals:   08/19/16 1700 08/19/16 2029 08/20/16 0419 08/20/16 0812  BP: 92/61 (!) 101/51 (!) 108/56 (!) 101/53  Pulse: 70 69 71 74  Resp:      Temp:  97.6 F (36.4 C) 97.8 F (36.6 C)   TempSrc:  Oral Oral   SpO2:  95% 93%   Weight:   130 lb 4.8 oz (59.1 kg)   Height:        Intake/Output Summary (Last 24 hours) at 08/20/16 0907 Last data filed at 08/20/16 0600  Gross per 24 hour  Intake              150 ml  Output             2700 ml  Net            -2550 ml   Filed Weights   08/18/16 0531 08/19/16 0536 08/20/16 0419  Weight: 125 lb 12.8 oz (57.1 kg) 130 lb 14.4 oz (59.4 kg) 130 lb 4.8 oz (59.1 kg)    Telemetry    NSR  - Personally Reviewed  ECG     NSR  - Personally Reviewed  Physical Exam   GEN: No acute distress.   Neck:  mild increase JVD Cardiac: RRR, 1-2 / 6 systolic murmur ,  LSB, slight radiation to axilla ( murmur is quieter than expected.  GI: Soft, nontender, non-distended  MS: No edema; No deformity. Neuro:  Nonfocal  Psych: Normal affect   Labs    Chemistry  Recent Labs Lab 08/18/16 0427 08/19/16 0343 08/20/16 0451  NA 138 139 138    K 4.0 4.2 4.0  CL 103 104 101  CO2 28 27 29   GLUCOSE 87 93 101*  BUN 14 11 16   CREATININE 0.93 0.95 1.04*  CALCIUM 8.7* 8.7* 8.8*  PROT  --  6.7  --   ALBUMIN  --  3.4*  --   AST  --  32  --   ALT  --  21  --   ALKPHOS  --  72  --   BILITOT  --  0.6  --   GFRNONAA >60 >60 >60  GFRAA >60 >60 >60  ANIONGAP 7 8 8      Hematology  Recent Labs Lab 08/17/16 0345  WBC 5.8  RBC 4.25  HGB 12.1  HCT 37.1  MCV 87.3  MCH 28.5  MCHC 32.6  RDW 14.1  PLT 212    Cardiac EnzymesNo results for input(s): TROPONINI in the last 168 hours. No results for input(s): TROPIPOC in the last 168 hours.   BNP  Recent Labs Lab 08/18/16  0427  BNP 252.1*     DDimer No results for input(s): DDIMER in the last 168 hours.   Radiology    Dg Orthopantogram  Result Date: 08/18/2016 CLINICAL DATA:  51 y/o  F; poor dentition. EXAM: ORTHOPANTOGRAM/PANORAMIC COMPARISON:  None. FINDINGS: There are several dental caries within the residual mandibular incisors, canines, and premolars. There is a periapical cyst associated with the right posterior most mandibular premolar. Residual left mandibular molar is absent the crown. IMPRESSION: Odontogenic disease with several dental caries and periapical cysts of the residual posterior most right mandibular tooth. Electronically Signed   By: Mitzi Hansen M.D.   On: 08/18/2016 20:48    Cardiac Studies      Patient Profile     51 y.o. female with CAD and severe MR, moderate - severe LV dysfunction   Assessment & Plan    1. Mitral regurgitation:   The patient has been seen by Dr. Cornelius Moras. She clearly has symptomatic mitral regurgitation she'll need a heart catheterization - already scheduled for Monday.  Discussed risks, benefits, options. She understands and agrees to proceed  Will need dental consult.   2. Acute on chronic systolic congestive heart failure: BP is ok this am    Will give Lasix and follow closely . I/O -4.5 liters     Signed, Kristeen Miss, MD  08/20/2016, 9:07 AM

## 2016-08-21 ENCOUNTER — Other Ambulatory Visit (HOSPITAL_COMMUNITY): Payer: Self-pay | Admitting: Respiratory Therapy

## 2016-08-21 ENCOUNTER — Encounter (HOSPITAL_COMMUNITY): Payer: Self-pay

## 2016-08-21 ENCOUNTER — Encounter (HOSPITAL_COMMUNITY)
Admission: AD | Disposition: A | Discharge status: Home / Self Care | Payer: Self-pay | Source: Other Acute Inpatient Hospital | Attending: Cardiology

## 2016-08-21 ENCOUNTER — Encounter (HOSPITAL_COMMUNITY): Payer: Self-pay | Admitting: Dentistry

## 2016-08-21 DIAGNOSIS — K0530 Chronic periodontitis, unspecified: Secondary | ICD-10-CM

## 2016-08-21 DIAGNOSIS — Z01818 Encounter for other preprocedural examination: Secondary | ICD-10-CM

## 2016-08-21 DIAGNOSIS — K029 Dental caries, unspecified: Secondary | ICD-10-CM

## 2016-08-21 DIAGNOSIS — I340 Nonrheumatic mitral (valve) insufficiency: Secondary | ICD-10-CM

## 2016-08-21 DIAGNOSIS — K0889 Other specified disorders of teeth and supporting structures: Secondary | ICD-10-CM

## 2016-08-21 DIAGNOSIS — K083 Retained dental root: Secondary | ICD-10-CM

## 2016-08-21 DIAGNOSIS — K045 Chronic apical periodontitis: Secondary | ICD-10-CM

## 2016-08-21 HISTORY — PX: RIGHT/LEFT HEART CATH AND CORONARY ANGIOGRAPHY: CATH118266

## 2016-08-21 LAB — POCT I-STAT 3, ART BLOOD GAS (G3+)
Bicarbonate: 25.9 mmol/L (ref 20.0–28.0)
O2 SAT: 95 %
PCO2 ART: 47.9 mmHg (ref 32.0–48.0)
PH ART: 7.342 — AB (ref 7.350–7.450)
TCO2: 27 mmol/L (ref 0–100)
pO2, Arterial: 83 mmHg (ref 83.0–108.0)

## 2016-08-21 LAB — POCT I-STAT 3, VENOUS BLOOD GAS (G3P V)
ACID-BASE EXCESS: 4 mmol/L — AB (ref 0.0–2.0)
BICARBONATE: 30.3 mmol/L — AB (ref 20.0–28.0)
O2 Saturation: 60 %
PCO2 VEN: 54.2 mmHg (ref 44.0–60.0)
PH VEN: 7.356 (ref 7.250–7.430)
PO2 VEN: 33 mmHg (ref 32.0–45.0)
TCO2: 32 mmol/L (ref 0–100)

## 2016-08-21 LAB — PROTIME-INR
INR: 0.99
Prothrombin Time: 13.1 seconds (ref 11.4–15.2)

## 2016-08-21 LAB — BASIC METABOLIC PANEL
Anion gap: 6 (ref 5–15)
BUN: 15 mg/dL (ref 6–20)
CO2: 32 mmol/L (ref 22–32)
Calcium: 8.6 mg/dL — ABNORMAL LOW (ref 8.9–10.3)
Chloride: 100 mmol/L — ABNORMAL LOW (ref 101–111)
Creatinine, Ser: 1.03 mg/dL — ABNORMAL HIGH (ref 0.44–1.00)
GFR calc Af Amer: >60 mL/min (ref >60)
GLUCOSE: 91 mg/dL (ref 65–99)
POTASSIUM: 3.9 mmol/L (ref 3.5–5.1)
Sodium: 138 mmol/L (ref 135–145)

## 2016-08-21 SURGERY — RIGHT/LEFT HEART CATH AND CORONARY ANGIOGRAPHY
Anesthesia: LOCAL

## 2016-08-21 MED ORDER — FENTANYL CITRATE (PF) 100 MCG/2ML IJ SOLN
INTRAMUSCULAR | Status: DC | PRN
  Administered 2016-08-21: 25 ug via INTRAVENOUS
  Administered 2016-08-21: 50 ug via INTRAVENOUS
  Administered 2016-08-21: 25 ug via INTRAVENOUS

## 2016-08-21 MED ORDER — HEPARIN (PORCINE) IN NACL 2-0.9 UNIT/ML-% IJ SOLN
INTRAMUSCULAR | Status: AC
  Filled 2016-08-21: qty 1000

## 2016-08-21 MED ORDER — NITROGLYCERIN 0.4 MG SL SUBL
SUBLINGUAL_TABLET | SUBLINGUAL | Status: AC
  Filled 2016-08-21: qty 1

## 2016-08-21 MED ORDER — SODIUM CHLORIDE 0.9 % IV SOLN: 250.0000 mL | INTRAVENOUS | Status: DC | PRN

## 2016-08-21 MED ORDER — HEPARIN (PORCINE) IN NACL 2-0.9 UNIT/ML-% IJ SOLN
INTRAMUSCULAR | Status: AC | PRN
  Administered 2016-08-21: 1000 mL

## 2016-08-21 MED ORDER — SODIUM CHLORIDE 0.9% FLUSH
3.0000 mL | Freq: Two times a day (BID) | INTRAVENOUS | Status: DC
  Administered 2016-08-22 – 2016-08-24 (×5): 3 mL via INTRAVENOUS

## 2016-08-21 MED ORDER — FENTANYL CITRATE (PF) 100 MCG/2ML IJ SOLN
INTRAMUSCULAR | Status: AC
  Filled 2016-08-21: qty 2

## 2016-08-21 MED ORDER — IOPAMIDOL (ISOVUE-370) INJECTION 76%
INTRAVENOUS | Status: DC | PRN
  Administered 2016-08-21: 55 mL

## 2016-08-21 MED ORDER — NITROGLYCERIN 0.4 MG SL SUBL
SUBLINGUAL_TABLET | SUBLINGUAL | Status: DC | PRN
  Administered 2016-08-21: .4 mg via SUBLINGUAL

## 2016-08-21 MED ORDER — MIDAZOLAM HCL 2 MG/2ML IJ SOLN
INTRAMUSCULAR | Status: DC | PRN
  Administered 2016-08-21 (×2): 1 mg via INTRAVENOUS

## 2016-08-21 MED ORDER — VERAPAMIL HCL 2.5 MG/ML IV SOLN
INTRAVENOUS | Status: AC
  Filled 2016-08-21: qty 2

## 2016-08-21 MED ORDER — IOPAMIDOL (ISOVUE-370) INJECTION 76%
INTRAVENOUS | Status: AC
  Filled 2016-08-21: qty 100

## 2016-08-21 MED ORDER — LIDOCAINE HCL (PF) 1 % IJ SOLN
INTRAMUSCULAR | Status: DC | PRN
  Administered 2016-08-21: 15 mL

## 2016-08-21 MED ORDER — SODIUM CHLORIDE 0.9 % IV SOLN: INTRAVENOUS | Status: AC

## 2016-08-21 MED ORDER — MIDAZOLAM HCL 2 MG/2ML IJ SOLN
INTRAMUSCULAR | Status: AC
  Filled 2016-08-21: qty 2

## 2016-08-21 MED ORDER — SODIUM CHLORIDE 0.9% FLUSH: 3.0000 mL | INTRAVENOUS | Status: DC | PRN

## 2016-08-21 MED ORDER — LIDOCAINE HCL (PF) 1 % IJ SOLN
INTRAMUSCULAR | Status: AC
  Filled 2016-08-21: qty 30

## 2016-08-21 SURGICAL SUPPLY — 16 items
CATH EXPO 5FR FR4 (CATHETERS) ×1 IMPLANT
CATH INFINITI 5 FR 3DRC (CATHETERS) ×1 IMPLANT
CATH INFINITI 5FR ANG PIGTAIL (CATHETERS) ×1 IMPLANT
CATH INFINITI 5FR JL4 (CATHETERS) ×1 IMPLANT
CATH SWAN GANZ 7F STRAIGHT (CATHETERS) ×1 IMPLANT
GLIDESHEATH SLEND SS 6F .021 (SHEATH) ×1 IMPLANT
GUIDEWIRE .025 260CM (WIRE) ×1 IMPLANT
GUIDEWIRE INQWIRE 1.5J.035X260 (WIRE) IMPLANT
INQWIRE 1.5J .035X260CM (WIRE) ×2
KIT HEART LEFT (KITS) ×2 IMPLANT
PACK CARDIAC CATHETERIZATION (CUSTOM PROCEDURE TRAY) ×2 IMPLANT
SHEATH PINNACLE 5F 10CM (SHEATH) ×1 IMPLANT
SHEATH PINNACLE 7F 10CM (SHEATH) ×1 IMPLANT
TRANSDUCER W/STOPCOCK (MISCELLANEOUS) ×2 IMPLANT
TUBING CIL FLEX 10 FLL-RA (TUBING) ×2 IMPLANT
WIRE EMERALD 3MM-J .035X150CM (WIRE) ×1 IMPLANT

## 2016-08-21 NOTE — Progress Notes (Signed)
Progress Note  Patient Name: Lindsay Camacho Date of Encounter: 08/21/2016  Primary Cardiologist: Eldridge Dace  Subjective   Awaiting cath. Less SOB, no CP  Inpatient Medications    Scheduled Meds: . aspirin EC  81 mg Oral Daily  . atorvastatin  80 mg Oral Daily  . carvedilol  6.25 mg Oral BID WC  . chlorhexidine  15 mL Mouth/Throat BID  . enoxaparin (LOVENOX) injection  40 mg Subcutaneous Q24H  . furosemide  40 mg Oral Daily  . potassium chloride  10 mEq Oral Daily  . sodium chloride flush  3 mL Intravenous Q12H  . sodium chloride flush  3 mL Intravenous Q12H   Continuous Infusions: . sodium chloride Stopped (08/18/16 2213)  . sodium chloride    . sodium chloride 1 mL/kg/hr (08/21/16 0636)   PRN Meds: sodium chloride, sodium chloride, acetaminophen, albuterol, diphenhydrAMINE, docusate sodium, hydrocortisone cream, Melatonin, ondansetron (ZOFRAN) IV, sodium chloride flush, sodium chloride flush, traMADol   Vital Signs    Vitals:   08/20/16 1959 08/20/16 2024 08/20/16 2049 08/21/16 0345  BP:   (!) 99/58   Pulse: (!) 58 (!) 108  84  Resp:      Temp:      TempSrc:      SpO2:   98% 97%  Weight:      Height:        Intake/Output Summary (Last 24 hours) at 08/21/16 0905 Last data filed at 08/20/16 2300  Gross per 24 hour  Intake              243 ml  Output             1850 ml  Net            -1607 ml   Filed Weights   08/18/16 0531 08/19/16 0536 08/20/16 0419  Weight: 125 lb 12.8 oz (57.1 kg) 130 lb 14.4 oz (59.4 kg) 130 lb 4.8 oz (59.1 kg)    Telemetry    BBB, NSR, PAC - Personally Reviewed  ECG    NSR, LBBB - Personally Reviewed  Physical Exam   GEN: No acute distress.   Neck: No JVD Cardiac: RRR, HSM apex, no rubs, or gallops.  Respiratory: Clear to auscultation bilaterally. GI: Soft, nontender, non-distended  MS: No edema; No deformity. Neuro:  Nonfocal  Psych: Normal affect   Labs    Chemistry Recent Labs Lab 08/19/16 0343 08/20/16 0451  08/21/16 0157  NA 139 138 138  K 4.2 4.0 3.9  CL 104 101 100*  CO2 27 29 32  GLUCOSE 93 101* 91  BUN 11 16 15   CREATININE 0.95 1.04* 1.03*  CALCIUM 8.7* 8.8* 8.6*  PROT 6.7  --   --   ALBUMIN 3.4*  --   --   AST 32  --   --   ALT 21  --   --   ALKPHOS 72  --   --   BILITOT 0.6  --   --   GFRNONAA >60 >60 >60  GFRAA >60 >60 >60  ANIONGAP 8 8 6      Hematology Recent Labs Lab 08/17/16 0345  WBC 5.8  RBC 4.25  HGB 12.1  HCT 37.1  MCV 87.3  MCH 28.5  MCHC 32.6  RDW 14.1  PLT 212    Cardiac EnzymesNo results for input(s): TROPONINI in the last 168 hours. No results for input(s): TROPIPOC in the last 168 hours.   BNP Recent Labs Lab 08/18/16 0427  BNP 252.1*     DDimer No results for input(s): DDIMER in the last 168 hours.   Radiology    No results found.  Cardiac Studies   ECHO 08/17/16 - Left ventricle: The cavity size was normal. Wall thickness was   normal. Systolic function was moderately reduced. The estimated   ejection fraction was in the range of 35% to 40%. Moderate   diffuse hypokinesis with no identifiable regional variations.   Features are consistent with a pseudonormal left ventricular   filling pattern, with concomitant abnormal relaxation and   increased filling pressure (grade 2 diastolic dysfunction). - Aortic valve: There was mild regurgitation. - Mitral valve: There was severe regurgitation directed centrally.   Severe regurgitation is suggested by pulmonary vein systolic flow   reversal. - Left atrium: The atrium was mildly dilated. - Atrial septum: No defect or patent foramen ovale was identified.  Impressions:  - Compared to 2016, left ventricular systolic function has   deteriorated and mitral insufficiency has worsened.  Patient Profile     51 y.o. female with severe MR, dilated cardiomyopathy EF 35%  Assessment & Plan    Severe MR  - Dr. Cornelius Moras has seen. Surgery  - Getting R and L HC today in anticipation of surgery   - On PO lasix 40 QD now. Stable.   Dilated CM  - EF 35%  - Worse when compared to prior  - Surgery for MR.   - comfortable from breathing perspective currently.   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

## 2016-08-21 NOTE — Consult Note (Signed)
DENTAL CONSULTATION  Date of Consultation:  08/21/2016 Patient Name:   Lindsay Camacho Date of Birth:   1965-09-10 Medical Record Number: [id number]  VITALS: BP 118/72   Pulse 70   Temp 98 F (36.7 C)   Resp 17   Ht 5\' 1"  (1.549 m)   Wt 130 lb 4.8 oz (59.1 kg)   SpO2 97%   BMI 24.62 kg/m   CHIEF COMPLAINT: Patient referred by Dr. Cornelius Moras for a dental consult.    HPI: Lindsay Camacho is a 51 year old female referred by Dr. Cornelius Moras for dental consultation. Patient recently diagnosed with severe mitral regurgitation with anticipated heart valve surgery. Patient now seen as part of a medically necessary pre-heart valve surgery dental protocol examination to rule out dental infection that may affect the patient's systemic health and anticipated heart valve surgery.  Patient currently denies acute toothaches, swellings, or abscesses. Patient last saw a dentist 15-20 years ago. Patient has no teeth in her maxilla and denies having an upper complete denture. Patient denies having a lower partial denture. Patient does not seek regular dental care due to economic concerns. Patient denies having dental phobia.  PROBLEM LIST: Patient Active Problem List   Diagnosis Date Noted  . Acute on chronic systolic CHF (congestive heart failure) (HCC)   . Mitral regurgitation 08/17/2016  . Heart failure (HCC) 08/17/2016  . Shortness of breath   . 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  . Diarrhea 10/05/2014  . Syncope 03/09/2014  . Chronic diastolic heart failure (HCC) 01/12/2014  . Chest pain, atypical, muscular skelatal   01/09/2014  . CHF (congestive heart failure), NYHA class I, Echo results pending 01/09/2014  . Diastolic CHF, acute (HCC) 01/08/2014  . Chest pain at rest 01/08/2014  . Congestive heart disease (HCC)   . Post-op pain-Right groin area 05/27/2013  . Drainage from wound-Groin area 05/27/2013  . Retroperitoneal hematoma 05/01/2013  . Retroperitoneal bleed 04/15/2013  . Anemia due to  blood loss 04/15/2013  . Hyperlipidemia with target LDL less than 70 04/15/2013  . Intermediate coronary syndrome (HCC)   . ACS (acute coronary syndrome) (HCC) 04/12/2013  . Chest pain 04/11/2013  . CAD - CFX DES 01/17/13 01/20/2013  . Cardiomyopathy, ischemic- EF 40-45% by echo 01/18/13, improved in 04/2013 to 50-55% 01/20/2013  . Rash post PCI 01/20/2013  . NSTEMI (non-ST elevated myocardial infarction) (HCC) 01/17/2013  . Chest pain, musculoskeletal 10/07/2012  . HTN (hypertension) 10/07/2012  . LBBB (left bundle branch block) 10/07/2012  . Tobacco abuse 10/07/2012    PMH: Past Medical History:  Diagnosis Date  . Acute myocardial infarction of other lateral wall, initial episode of care 01/2013   DES CFX  . CAD (coronary artery disease) 11/13/2011   50% ? proximal LCx stenosis per Cath [facility]  . Chest pain, atypical, muscular skelatal   01/09/2014  . Dyspnea   . Heart failure (HCC)   . HLD (hyperlipidemia)   . Hypertension   . Intermediate coronary syndrome (HCC)   . LBBB (left bundle branch block)   . Mitral regurgitation   . PVD (peripheral vascular disease) (HCC)    Right CFA stenosis per report on 11/14/2011  . Stroke [facility])    tia with no deficits    PSH: Past Surgical History:  Procedure Laterality Date  . APPENDECTOMY    . CARDIAC CATHETERIZATION  04/14/2013   Non-obstructive disease, patent CFX stent  . CARDIAC CATHETERIZATION N/A 09/21/2014   Procedure: Left Heart Cath and Coronary Angiography;  Surgeon: Lennette Bihari, MD;  Location: [facility] INVASIVE CV LAB;  Service: Cardiovascular;  Laterality: N/A;  . CORONARY ANGIOPLASTY WITH STENT PLACEMENT  01/17/2013   mild disease except 99% CFX, rx with  2.5 x 28 Alpine drug-eluting stent   . GROIN DEBRIDEMENT Right 04/14/2013   Procedure: Emergency Evacuation of Retroperitoneal Hematoma and Repair Right External Iliac Artery Pseudoaneurysm    ;  Surgeon: Sherren Kerns, MD;  Location: [facility] OR;  Service: Vascular;  Laterality:  Right;  . LEFT HEART CATHETERIZATION WITH CORONARY ANGIOGRAM N/A 01/18/2013   Procedure: LEFT HEART CATHETERIZATION WITH CORONARY ANGIOGRAM;  Surgeon: Corky Crafts, MD;  Location: [facility] CATH LAB;  Service: Cardiovascular;  Laterality: N/A;  . LEFT HEART CATHETERIZATION WITH CORONARY ANGIOGRAM N/A 04/14/2013   Procedure: LEFT HEART CATHETERIZATION WITH CORONARY ANGIOGRAM;  Surgeon: Corky Crafts, MD;  Location: [facility] CATH LAB;  Service: Cardiovascular;  Laterality: N/A;  . PERCUTANEOUS CORONARY STENT INTERVENTION (PCI-S)  01/18/2013   Procedure: PERCUTANEOUS CORONARY STENT INTERVENTION (PCI-S);  Surgeon: Corky Crafts, MD;  Location: [facility] CATH LAB;  Service: Cardiovascular;;  . TEE WITHOUT CARDIOVERSION N/A 03/06/2014   Procedure: TRANSESOPHAGEAL ECHOCARDIOGRAM (TEE);  Surgeon: Lars Masson, MD;  Location: [facility] ENDOSCOPY;  Service: Cardiovascular;  Laterality: N/A;  . TUBAL LIGATION      ALLERGIES: Allergies  Allergen Reactions  . Brilinta [Ticagrelor] Other (See Comments)    Dyspnea  . Other Other (See Comments)    BP med (unsure of name) BP bottomed out  . Dye Fdc Red [Red Dye] Other (See Comments)    Allergic to dye that is used in nuclear stress test. Pt sates makes her feel like her blood is boiling and itching  . Penicillins Rash    MEDICATIONS: Current Facility-Administered Medications  Medication Dose Route Frequency Provider Last Rate Last Dose  . 0.9 %  sodium chloride infusion  250 mL Intravenous PRN Precious Reel, MD   Stopped at 08/18/16 2213  . 0.9 %  sodium chloride infusion  250 mL Intravenous PRN Nahser, Deloris Ping, MD      . 0.9% sodium chloride infusion  1 mL/kg/hr Intravenous Continuous Nahser, Deloris Ping, MD 59.1 mL/hr at 08/21/16 0636 1 mL/kg/hr at 08/21/16 0636  . acetaminophen (TYLENOL) tablet 650 mg  650 mg Oral Q4H PRN Precious Reel, MD   650 mg at 08/19/16 0456  . albuterol (PROVENTIL) (2.5 MG/3ML) 0.083% nebulizer solution 2.5 mg  2.5  mg Nebulization Q6H PRN [place] Ouch, [place] M, PA-C   2.5 mg at 08/20/16 2140  . aspirin EC tablet 81 mg  81 mg Oral Daily Precious Reel, MD   81 mg at 08/20/16 1034  . atorvastatin (LIPITOR) tablet 80 mg  80 mg Oral Daily Precious Reel, MD   80 mg at 08/21/16 1610  . carvedilol (COREG) tablet 6.25 mg  6.25 mg Oral BID WC Precious Reel, MD   6.25 mg at 08/21/16 0948  . chlorhexidine (PERIDEX) 0.12 % solution 15 mL  15 mL Mouth/Throat BID Purcell Nails, MD   15 mL at 08/21/16 0949  . diphenhydrAMINE (BENADRYL) capsule 25 mg  25 mg Oral Q8H PRN Ellsworth Lennox, PA-C   25 mg at 08/20/16 2140  . docusate sodium (COLACE) capsule 100 mg  100 mg Oral BID PRN Precious Reel, MD   100 mg at 08/19/16 2042  . enoxaparin (LOVENOX) injection 40 mg  40 mg Subcutaneous Q24H Precious Reel, MD   40  mg at 08/20/16 1034  . furosemide (LASIX) tablet 40 mg  40 mg Oral Daily Nahser, Deloris Ping, MD   40 mg at 08/20/16 1034  . hydrocortisone cream 0.5 %   Topical TID PRN Abelino Derrick, PA-C      . Melatonin TABS 6 mg  6 mg Oral QHS PRN Lars Masson, MD   6 mg at 08/20/16 2141  . ondansetron (ZOFRAN) injection 4 mg  4 mg Intravenous Q6H PRN Precious Reel, MD   4 mg at 08/20/16 0810  . potassium chloride (K-DUR,KLOR-CON) CR tablet 10 mEq  10 mEq Oral Daily Precious Reel, MD   10 mEq at 08/21/16 [id number]  . sodium chloride flush (NS) 0.9 % injection 3 mL  3 mL Intravenous Q12H Precious Reel, MD   3 mL at 08/20/16 1754  . sodium chloride flush (NS) 0.9 % injection 3 mL  3 mL Intravenous PRN Precious Reel, MD      . sodium chloride flush (NS) 0.9 % injection 3 mL  3 mL Intravenous Q12H Nahser, Deloris Ping, MD   3 mL at 08/21/16 0949  . sodium chloride flush (NS) 0.9 % injection 3 mL  3 mL Intravenous PRN Nahser, Deloris Ping, MD      . traMADol Janean Sark) tablet 50 mg  50 mg Oral Q6H PRN Ellsworth Lennox, PA-C   50 mg at 08/20/16 2203     LABS: Lab Results  Component Value Date   WBC 5.8 08/17/2016   HGB 12.1 08/17/2016   HCT 37.1 08/17/2016   MCV 87.3 08/17/2016   PLT 212 08/17/2016      Component Value Date/Time   NA 138 08/21/2016 0157   NA 141 04/04/2016 0917   NA 138 07/05/2011 1145   K 3.9 08/21/2016 0157   K 3.8 07/05/2011 1145   CL 100 (L) 08/21/2016 0157   CL 109 (H) 07/05/2011 1145   CO2 32 08/21/2016 0157   CO2 25 07/05/2011 1145   GLUCOSE 91 08/21/2016 0157   GLUCOSE 81 07/05/2011 1145   BUN 15 08/21/2016 0157   BUN 9 04/04/2016 0917   BUN 15 07/05/2011 1145   CREATININE 1.03 (H) 08/21/2016 0157   CREATININE 0.85 10/27/2015 1430   CALCIUM 8.6 (L) 08/21/2016 0157   CALCIUM 8.4 (L) 07/05/2011 1145   GFRNONAA >60 08/21/2016 0157   GFRNONAA >60 07/05/2011 1145   GFRAA >60 08/21/2016 0157   GFRAA >60 07/05/2011 1145   Lab Results  Component Value Date   INR 0.99 08/21/2016   INR 1.10 09/17/2014   INR 1.10 01/08/2014   No results found for: PTT  SOCIAL HISTORY: Social History   Social History  . Marital status: Single    Spouse name: N/A  . Number of children: N/A  . Years of education: N/A   Occupational History  . Not on file.   Social History Main Topics  . Smoking status: Former Smoker    Packs/day: 0.50    Types: Cigarettes    Quit date: 08/19/2015  . Smokeless tobacco: Never Used     Comment: i QUIT SMOKING A YEAR AGO, i DONT REMEMBER WHAT MONTH  . Alcohol use No  . Drug use: Yes    Types: Marijuana  . Sexual activity: Yes    Birth control/ protection: None   Other Topics Concern  . Not on file   Social History Narrative  . No narrative on file    FAMILY HISTORY: Family  History  Problem Relation Age of Onset  . CAD Mother 47  . Lung cancer Mother   . Bladder Cancer Mother   . Stroke Mother   . Heart disease Mother        Before age 14  . Hypertension Mother   . Heart attack Mother   . CAD Father 37  . Heart disease Father        Before age 41  .  Heart attack Father   . Stroke Father        Bleeding stroke    REVIEW OF SYSTEMS: Reviewed with the patient as per History of present illness. Psych: Patient denies having dental phobia.  DENTAL HISTORY: CHIEF COMPLAINT: Patient referred by Dr. Cornelius Moras for a dental consult.    HPI: Lindsay Camacho is a 51 year old female referred by Dr. Cornelius Moras for dental consultation. Patient recently diagnosed with severe mitral regurgitation with anticipated heart valve surgery. Patient now seen as part of a medically necessary pre-heart valve surgery dental protocol examination to rule out dental infection that may affect the patient's systemic health and anticipated heart valve surgery.  Patient currently denies acute toothaches, swellings, or abscesses. Patient last saw a dentist 15-20 years ago. Patient has no teeth in her maxilla and denies having an upper complete denture. Patient denies having a lower partial denture. Patient does not seek regular dental care due to economic concerns. Patient denies having dental phobia.  DENTAL EXAMINATION: GENERAL: The patient is a well-developed, well-nourished female in no acute distress. HEAD AND NECK: There is no palpable neck lymphadenopathy. The patient denies acute TMJ symptoms. INTRAORAL EXAM: Patient has normal saliva. There is no evidence of oral abscess formation. Patient has an edentulous maxilla. There is atrophy of the edentulous alveolar ridges. DENTITION: Patient is missing tooth numbers 1 through 16, 18, 19, 24, 25, and 30-32. Tooth #17 is present as a retained root segment. PERIODONTAL: The patient has chronic periodontal disease with plaque and calculus accumulations, generalized gingival recession, and tooth mobility. There is moderate to severe bone loss noted. DENTAL CARIES/SUBOPTIMAL RESTORATIONS: Multiple dental caries are noted involving the remaining teeth. ENDODONTIC: Patient denies acute pulpitis symptoms. Patient does have periapical  pathology and radiolucency associated with the apex of tooth #29. CROWN AND BRIDGE: The patient has no crown or bridge restorations. PROSTHODONTIC: Patient denies having an upper complete denture or lower partial denture. OCCLUSION: Patient has a poor occlusal scheme secondary to multiple missing teeth, retained root segments, and lack of replacement of missing teeth with dental prostheses.  RADIOGRAPHIC INTERPRETATION: Orthopantogram was taken on 08/16/2016. There are multiple missing teeth. There is retained root segment in the area #17. There is moderate to severe bone loss. Radiographic calculus is noted. Dental caries are noted. There is a periapical radiolucency at the apex of tooth #29.   ASSESSMENTS: 1. Severe mitral regurgitation 2. Pre-heart valve surgery dental protocol 3. Chronic apical periodontitis 4. Dental caries 5. Retained root segment 6. Chronic periodontitis with bone loss 7. Accretions 8. Gingival recession 9. Tooth mobility 10. Multiple missing teeth 11. No history of upper complete denture or lower partial denture 12. Poor occlusal scheme and malocclusion 13. Risk for bleeding with invasive dental procedures due to current Lovenox therapy 14. Risk for complications with anticipated invasive dental procedures in the operative room with general anesthesia up to and including death due to overall cardiovascular and respiratory compromise.   PLAN/RECOMMENDATIONS: 1. I discussed the risks, benefits, and complications of various treatment options  with the patient in relationship to her medical and dental conditions, anticipated heart valve surgery, and risk for endocarditis. We discussed various treatment options to include no treatment, multiple extractions with alveoloplasty, pre-prosthetic surgery as indicated, periodontal therapy, dental restorations, root canal therapy, crown and bridge therapy, implant therapy, and replacement of missing teeth as indicated. The  patient currently wishes to proceed with extraction of remaining teeth with alveoloplasty in the operating room with general anesthesia. This has been tentatively scheduled for this Wednesday at 10 AM following my first case at [facility]. The patient will then proceed with heart valve surgery at the discretion of Dr. Cornelius Moras after adequate healing from the dental extractions.  The patient will then follow-up with a dentist of her choice for fabrication of upper and lower complete dentures after adequate healing and once medically stable from the anticipated heart valve surgery.  In the meantime, the patient will be maintained on chlorhexidine rinses for the next 3-6 months as prescribed. Patient is currently scheduled for cardiac catheterization later today.   2. Discussion of findings with medical team and coordination of future medical and dental care as needed.     [name], DDS

## 2016-08-21 NOTE — Progress Notes (Signed)
CARDIAC REHAB PHASE I   PRE:  Rate/Rhythm: 71 SR PVCs  BP:  Supine: 122/64  Sitting: 118/72  Standing:    SaO2: 97%RA  MODE:  Ambulation: 300 ft   POST:  Rate/Rhythm: 75 SR  BP:  Supine:   Sitting: 107/63  Standing:    SaO2: 96%RA 0928-1010 Pt walked 300 ft on RA with asst x 1 with fairly steady gait. C/o right leg uncomfortable during walk and some rib pain. Stopped several times to rest. To recliner with call bell. For cath this afternoon.   Luetta Nutting, RN BSN  08/21/2016 10:07 AM

## 2016-08-21 NOTE — H&P (View-Only) (Signed)
Progress Note  Patient Name: Lindsay Camacho Date of Encounter: 08/21/2016  Primary Cardiologist: Eldridge Dace  Subjective   Awaiting cath. Less SOB, no CP  Inpatient Medications    Scheduled Meds: . aspirin EC  81 mg Oral Daily  . atorvastatin  80 mg Oral Daily  . carvedilol  6.25 mg Oral BID WC  . chlorhexidine  15 mL Mouth/Throat BID  . enoxaparin (LOVENOX) injection  40 mg Subcutaneous Q24H  . furosemide  40 mg Oral Daily  . potassium chloride  10 mEq Oral Daily  . sodium chloride flush  3 mL Intravenous Q12H  . sodium chloride flush  3 mL Intravenous Q12H   Continuous Infusions: . sodium chloride Stopped (08/18/16 2213)  . sodium chloride    . sodium chloride 1 mL/kg/hr (08/21/16 0636)   PRN Meds: sodium chloride, sodium chloride, acetaminophen, albuterol, diphenhydrAMINE, docusate sodium, hydrocortisone cream, Melatonin, ondansetron (ZOFRAN) IV, sodium chloride flush, sodium chloride flush, traMADol   Vital Signs    Vitals:   08/20/16 1959 08/20/16 2024 08/20/16 2049 08/21/16 0345  BP:   (!) 99/58   Pulse: (!) 58 (!) 108  84  Resp:      Temp:      TempSrc:      SpO2:   98% 97%  Weight:      Height:        Intake/Output Summary (Last 24 hours) at 08/21/16 0905 Last data filed at 08/20/16 2300  Gross per 24 hour  Intake              243 ml  Output             1850 ml  Net            -1607 ml   Filed Weights   08/18/16 0531 08/19/16 0536 08/20/16 0419  Weight: 125 lb 12.8 oz (57.1 kg) 130 lb 14.4 oz (59.4 kg) 130 lb 4.8 oz (59.1 kg)    Telemetry    BBB, NSR, PAC - Personally Reviewed  ECG    NSR, LBBB - Personally Reviewed  Physical Exam   GEN: No acute distress.   Neck: No JVD Cardiac: RRR, HSM apex, no rubs, or gallops.  Respiratory: Clear to auscultation bilaterally. GI: Soft, nontender, non-distended  MS: No edema; No deformity. Neuro:  Nonfocal  Psych: Normal affect   Labs    Chemistry Recent Labs Lab 08/19/16 0343 08/20/16 0451  08/21/16 0157  NA 139 138 138  K 4.2 4.0 3.9  CL 104 101 100*  CO2 27 29 32  GLUCOSE 93 101* 91  BUN 11 16 15   CREATININE 0.95 1.04* 1.03*  CALCIUM 8.7* 8.8* 8.6*  PROT 6.7  --   --   ALBUMIN 3.4*  --   --   AST 32  --   --   ALT 21  --   --   ALKPHOS 72  --   --   BILITOT 0.6  --   --   GFRNONAA >60 >60 >60  GFRAA >60 >60 >60  ANIONGAP 8 8 6      Hematology Recent Labs Lab 08/17/16 0345  WBC 5.8  RBC 4.25  HGB 12.1  HCT 37.1  MCV 87.3  MCH 28.5  MCHC 32.6  RDW 14.1  PLT 212    Cardiac EnzymesNo results for input(s): TROPONINI in the last 168 hours. No results for input(s): TROPIPOC in the last 168 hours.   BNP Recent Labs Lab 08/18/16 0427  BNP 252.1*     DDimer No results for input(s): DDIMER in the last 168 hours.   Radiology    No results found.  Cardiac Studies   ECHO 08/17/16 - Left ventricle: The cavity size was normal. Wall thickness was   normal. Systolic function was moderately reduced. The estimated   ejection fraction was in the range of 35% to 40%. Moderate   diffuse hypokinesis with no identifiable regional variations.   Features are consistent with a pseudonormal left ventricular   filling pattern, with concomitant abnormal relaxation and   increased filling pressure (grade 2 diastolic dysfunction). - Aortic valve: There was mild regurgitation. - Mitral valve: There was severe regurgitation directed centrally.   Severe regurgitation is suggested by pulmonary vein systolic flow   reversal. - Left atrium: The atrium was mildly dilated. - Atrial septum: No defect or patent foramen ovale was identified.  Impressions:  - Compared to 2016, left ventricular systolic function has   deteriorated and mitral insufficiency has worsened.  Patient Profile     51 y.o. female with severe MR, dilated cardiomyopathy EF 35%  Assessment & Plan    Severe MR  - Dr. Cornelius Moras has seen. Surgery  - Getting R and L HC today in anticipation of surgery   - On PO lasix 40 QD now. Stable.   Dilated CM  - EF 35%  - Worse when compared to prior  - Surgery for MR.   - comfortable from breathing perspective currently.   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

## 2016-08-21 NOTE — Plan of Care (Signed)
Problem: Safety: Goal: Ability to remain free from injury will improve Patient remained free of injury/ falls this shift. Will continue to monitor  Problem: Skin Integrity: Goal: Risk for impaired skin integrity will decrease Outcome: Progressing Pt has rash on her chest (anterior), right arm, left side on left lower quadrant and on left buttock. Applied hydrocortisone creme per order and gave PO benadryl. Also has bruising on her left wrist , pt says it is from ABG lab draw on her restricted extremity.Will continue to monitor.

## 2016-08-21 NOTE — Progress Notes (Signed)
Site area: Left groin a 5 french arterial sheath and 7 french venous sheath was removed  Site Prior to Removal:  Level 0  Pressure Applied For 15 MINUTES    Bedrest Beginning at 1610p  Manual:   Yes.    Patient Status During Pull:  stable  Post Pull Groin Site:  Level 0  Post Pull Instructions Given:  Yes.    Post Pull Pulses Present:  Yes.    Dressing Applied:  Yes.    Comments:  VS remain stable during sheath pull

## 2016-08-21 NOTE — Interval H&P Note (Signed)
History and Physical Interval Note:  08/21/2016 2:24 PM  Lindsay Camacho  has presented today for cardiac cath with the diagnosis of severe mitral regurgitation, pre-op clearance  The various methods of treatment have been discussed with the patient and family. After consideration of risks, benefits and other options for treatment, the patient has consented to  Procedure(s): RIGHT/LEFT HEART CATH AND CORONARY ANGIOGRAPHY (N/A) as a surgical intervention .  The patient's history has been reviewed, patient examined, no change in status, stable for surgery.  I have reviewed the patient's chart and labs.  Questions were answered to the patient's satisfaction.    Cath Lab Visit (complete for each Cath Lab visit)  Clinical Evaluation Leading to the Procedure:   ACS: No.  Non-ACS:    Anginal Classification: CCS II  Anti-ischemic medical therapy: Minimal Therapy (1 class of medications)  Non-Invasive Test Results: No non-invasive testing performed  Prior CABG: No previous CABG         Verne Carrow

## 2016-08-22 ENCOUNTER — Inpatient Hospital Stay (HOSPITAL_COMMUNITY): Payer: Self-pay

## 2016-08-22 ENCOUNTER — Encounter (HOSPITAL_COMMUNITY): Payer: Self-pay | Admitting: Cardiovascular Disease

## 2016-08-22 DIAGNOSIS — Z01810 Encounter for preprocedural cardiovascular examination: Secondary | ICD-10-CM

## 2016-08-22 DIAGNOSIS — I7409 Other arterial embolism and thrombosis of abdominal aorta: Secondary | ICD-10-CM

## 2016-08-22 DIAGNOSIS — I771 Stricture of artery: Secondary | ICD-10-CM | POA: Diagnosis present

## 2016-08-22 HISTORY — DX: Other arterial embolism and thrombosis of abdominal aorta: I74.09

## 2016-08-22 HISTORY — DX: Stricture of artery: I77.1

## 2016-08-22 LAB — PULMONARY FUNCTION TEST
DL/VA % PRED: 79 %
DL/VA: 3.51 ml/min/mmHg/L
DLCO COR: 12.01 ml/min/mmHg
DLCO UNC % PRED: 56 %
DLCO cor % pred: 59 %
DLCO unc: 11.5 ml/min/mmHg
FEF 25-75 PRE: 2.13 L/s
FEF 25-75 Post: 2.31 L/sec
FEF2575-%CHANGE-POST: 8 %
FEF2575-%PRED-PRE: 83 %
FEF2575-%Pred-Post: 90 %
FEV1-%Change-Post: 9 %
FEV1-%PRED-PRE: 72 %
FEV1-%Pred-Post: 79 %
FEV1-POST: 1.98 L
FEV1-PRE: 1.82 L
FEV1FVC-%CHANGE-POST: 0 %
FEV1FVC-%PRED-PRE: 106 %
FEV6-%CHANGE-POST: 10 %
FEV6-%PRED-PRE: 69 %
FEV6-%Pred-Post: 76 %
FEV6-Post: 2.35 L
FEV6-Pre: 2.14 L
FEV6FVC-%PRED-POST: 103 %
FEV6FVC-%PRED-PRE: 103 %
FVC-%Change-Post: 10 %
FVC-%Pred-Post: 74 %
FVC-%Pred-Pre: 67 %
FVC-PRE: 2.14 L
FVC-Post: 2.35 L
POST FEV6/FVC RATIO: 100 %
Post FEV1/FVC ratio: 84 %
Pre FEV1/FVC ratio: 85 %
Pre FEV6/FVC Ratio: 100 %
RV % PRED: 91 %
RV: 1.51 L
TLC % PRED: 81 %
TLC: 3.74 L

## 2016-08-22 LAB — BASIC METABOLIC PANEL
Anion gap: 5 (ref 5–15)
BUN: 15 mg/dL (ref 6–20)
CALCIUM: 8.7 mg/dL — AB (ref 8.9–10.3)
CO2: 27 mmol/L (ref 22–32)
Chloride: 106 mmol/L (ref 101–111)
Creatinine, Ser: 0.88 mg/dL (ref 0.44–1.00)
GLUCOSE: 82 mg/dL (ref 65–99)
Potassium: 4 mmol/L (ref 3.5–5.1)
Sodium: 138 mmol/L (ref 135–145)

## 2016-08-22 LAB — VAS US DOPPLER PRE CABG
LCCADDIAS: 28 cm/s
LCCAPDIAS: 25 cm/s
LEFT ECA DIAS: -19 cm/s
LEFT VERTEBRAL DIAS: -11 cm/s
LICADDIAS: -34 cm/s
LICADSYS: -117 cm/s
LICAPDIAS: 33 cm/s
LICAPSYS: 118 cm/s
Left CCA dist sys: 110 cm/s
Left CCA prox sys: 111 cm/s
RIGHT ECA DIAS: -16 cm/s
RIGHT VERTEBRAL DIAS: 21 cm/s
Right CCA prox dias: 22 cm/s
Right CCA prox sys: 80 cm/s
Right cca dist sys: -120 cm/s

## 2016-08-22 LAB — SURGICAL PCR SCREEN
MRSA, PCR: NEGATIVE
Staphylococcus aureus: NEGATIVE

## 2016-08-22 MED ORDER — ACETAMINOPHEN 325 MG PO TABS
650.0000 mg | ORAL_TABLET | Freq: Once | ORAL | Status: AC
  Administered 2016-08-22: 650 mg via ORAL
  Filled 2016-08-22: qty 2

## 2016-08-22 MED ORDER — MAGNESIUM SULFATE IN D5W 1-5 GM/100ML-% IV SOLN
1.0000 g | Freq: Once | INTRAVENOUS | Status: AC
  Administered 2016-08-22: 1 g via INTRAVENOUS
  Filled 2016-08-22: qty 100

## 2016-08-22 MED ORDER — IOPAMIDOL (ISOVUE-370) INJECTION 76%
INTRAVENOUS | Status: AC
  Administered 2016-08-22: 100 mL via INTRAVENOUS
  Filled 2016-08-22: qty 100

## 2016-08-22 NOTE — Progress Notes (Signed)
Progress Note  Patient Name: Lindsay Camacho Date of Encounter: 08/22/2016  Primary Cardiologist: Eldridge Dace  Subjective   Cardiac catheterization yesterday reveled patent stent in the circumflex artery with minimal restenosis. Mild non-obstruct dz in the RCA and LAD (spasm in the RCA with catheter engagement that resolved with SL NTG. The plan is medical therapy and to continue for valve repair replacement.   Inpatient Medications    Scheduled Meds: . aspirin EC  81 mg Oral Daily  . atorvastatin  80 mg Oral Daily  . carvedilol  6.25 mg Oral BID WC  . chlorhexidine  15 mL Mouth/Throat BID  . furosemide  40 mg Oral Daily  . potassium chloride  10 mEq Oral Daily  . sodium chloride flush  3 mL Intravenous Q12H  . sodium chloride flush  3 mL Intravenous Q12H   Continuous Infusions: . sodium chloride Stopped (08/18/16 2213)  . sodium chloride     PRN Meds: sodium chloride, sodium chloride, acetaminophen, albuterol, diphenhydrAMINE, docusate sodium, hydrocortisone cream, Melatonin, ondansetron (ZOFRAN) IV, sodium chloride flush, sodium chloride flush, traMADol   Vital Signs    Vitals:   08/21/16 2034 08/22/16 0059 08/22/16 0425 08/22/16 0509  BP: 107/63 105/65 106/67 107/67  Pulse: 67 64 61 68  Resp: 14 13 12 13   Temp: 97.7 F (36.5 C) 98 F (36.7 C)  97.8 F (36.6 C)  TempSrc: Oral Oral  Oral  SpO2: 96% 93% 97% 92%  Weight:    129 lb 6.4 oz (58.7 kg)  Height:        Intake/Output Summary (Last 24 hours) at 08/22/16 0810 Last data filed at 08/21/16 2000  Gross per 24 hour  Intake           588.94 ml  Output             1650 ml  Net         -1061.06 ml   Filed Weights   08/19/16 0536 08/20/16 0419 08/22/16 0509  Weight: 130 lb 14.4 oz (59.4 kg) 130 lb 4.8 oz (59.1 kg) 129 lb 6.4 oz (58.7 kg)    Telemetry    BBB, NSR, PVCs present- Personally Reviewed   Physical Exam   GEN: Well nourished, well developed HEENT: normal  Neck: no JVD, carotid bruits, or  masses Cardiac: RRR. HSM apex, no rubs or gallops Respiratory: clear to auscultation bilaterally, normal work of breathing GI: soft, nontender, nondistended, + BS MS: no deformity or atrophy  Skin: warm and dry, no rash Neuro: Alert and Oriented x 3, Strength and sensation are intact Psych:   Full affect  Labs    Chemistry Recent Labs Lab 08/19/16 0343 08/20/16 0451 08/21/16 0157 08/22/16 0517  NA 139 138 138 138  K 4.2 4.0 3.9 4.0  CL 104 101 100* 106  CO2 27 29 32 27  GLUCOSE 93 101* 91 82  BUN 11 16 15 15   CREATININE 0.95 1.04* 1.03* 0.88  CALCIUM 8.7* 8.8* 8.6* 8.7*  PROT 6.7  --   --   --   ALBUMIN 3.4*  --   --   --   AST 32  --   --   --   ALT 21  --   --   --   ALKPHOS 72  --   --   --   BILITOT 0.6  --   --   --   GFRNONAA >60 >60 >60 >60  GFRAA >60 >60 >60 >60  ANIONGAP  8 8 6 5      Hematology Recent Labs Lab 08/17/16 0345  WBC 5.8  RBC 4.25  HGB 12.1  HCT 37.1  MCV 87.3  MCH 28.5  MCHC 32.6  RDW 14.1  PLT 212    Cardiac EnzymesNo results for input(s): TROPONINI in the last 168 hours. No results for input(s): TROPIPOC in the last 168 hours.   BNP Recent Labs Lab 08/18/16 0427  BNP 252.1*     DDimer No results for input(s): DDIMER in the last 168 hours.   Radiology    No results found.  Cardiac Studies   ECHO 08/17/16 - Left ventricle: The cavity size was normal. Wall thickness was normal. Systolic function was moderately reduced. The estimated ejection fraction was in the range of 35% to 40%. Moderate diffuse hypokinesis with no identifiable regional variations. Features are consistent with a pseudonormal left ventricular filling pattern, with concomitant abnormal relaxation and increased filling pressure (grade 2 diastolic dysfunction). - Aortic valve: There was mild regurgitation. - Mitral valve: There was severe regurgitation directed centrally. Severe regurgitation is suggested by pulmonary vein systolic  flow reversal. - Left atrium: The atrium was mildly dilated. - Atrial septum: No defect or patent foramen ovale was identified.  Impressions:  - Compared to 2016, left ventricular systolic function has deteriorated and mitral insufficiency has worsened.  08/21/2016 Cardiac Catheterization RIGHT/LEFT HEART CATH AND CORONARY ANGIOGRAPHY  Conclusion     Ost RCA to Prox RCA lesion, 10 %stenosed.  Dist RCA lesion, 30 %stenosed.  Prox Cx to Mid Cx lesion, 10 %stenosed.  Prox LAD to Mid LAD lesion, 30 %stenosed.   1. Patent stent in the Circumflex artery with minimal restenosis 2. Mild non-obstructive disease in the RCA and LAD (of note, there was spasm in the RCA with catheter engagement that resolved with SL NTG)  Recommendations: Medical management of CAD. Continue planning for valve repair/replacement.       Patient Profile     51 y.o. female with severe MR, dilated cardiomyopathy EF 35%  Assessment & Plan     Severe Mitral Regurg - Dr. Cornelius Moras has evaluated patient and plans for surgery - R and L HC done yesterday, no intervention required. - On PO Lasix 40 QD now.  Dilated CM - EF 35%, worse compared to prior - Surgery planned for MR, not currently having and significant SOB - Cath yesterday showed patent stent  Tobacco use -cessation  LBBB - no syncope  PVD - right CFA stenosis reported in past - plavix has been stopped.  Dental Disease - Pt seen by Dr. Kristin Bruins, DDS yesterday for pre-heart valve surgery dental protocol - tentatively scheduled for extraction of remaining teeth with alveoplasty in the operating room with general anesthesia, at 10 AM this Wednesday, im assuming patient will stay admitted until after dental surgery but this is not clearly stated in any of the notes. - Plan for valve surgery after adequate healing from the dental extractions.   Signed, Dorthula Matas, PA-C  08/22/2016, 8:10 AM    Personally seen and examined.  Agree with above.  No SOB at rest, no CP  PE: unchanged, HSM apex noted, CTAB, alert  Severe MR  - await surgery  - Dental extractions tomorrow  - Dr. Cornelius Moras  Dilated CM  - fairly well compensated. Continue daily lasix  PVD  - off plavix  - right CFA stenosis noted in the past  Donato Schultz, MD

## 2016-08-22 NOTE — Progress Notes (Signed)
Pre-op Cardiac Surgery  Carotid Findings:  Findings suggest 1-39% internal carotid artery stenosis bilaterally. The right vertebral artery is patent with antegrade flow. The left vertebral artery is patent with retrograde flow; this finding, along with monophasic left subclavian artery flow is suggestive of possible proximal obstruction.  Upper Extremity Right Left  Brachial Pressures 93-Triphasic Monophasic- unable to obtain pressure due to restricted limb  Radial Waveforms Triphasic Monophasic  Ulnar Waveforms Triphasic Monophasic  Palmar Arch (Allen's Test) Signal is unaffected with radial compression, reverses with ulnar compression. Signal is unaffected with radial compression, decreases 50% with ulnar compression.    08/22/2016 3:53 PM Gertie Fey, BS, RVT, RDCS, RDMS

## 2016-08-22 NOTE — Progress Notes (Signed)
[address]       [place] [address]             [phone]     CARDIOTHORACIC SURGERY PROGRESS NOTE  1 Day Post-Op  S/P Procedure(s) (LRB): RIGHT/LEFT HEART CATH AND CORONARY ANGIOGRAPHY (N/A)  Subjective: No complaints  Objective: Vital signs in last 24 hours: Temp:  [97.7 F (36.5 C)-98 F (36.7 C)] 98 F (36.7 C) (08/21 1606) Pulse Rate:  [61-80] 70 (08/21 1606) Cardiac Rhythm: Normal sinus rhythm (08/21 0800) Resp:  [12-18] 15 (08/21 1606) BP: (94-121)/(56-72) 94/56 (08/21 1606) SpO2:  [92 %-97 %] 94 % (08/21 1606) Weight:  [129 lb 6.4 oz (58.7 kg)] 129 lb 6.4 oz (58.7 kg) (08/21 0509)  Physical Exam:  Rhythm:   sinus  Breath sounds: clear  Heart sounds:  RRR III/VI murmur  Incisions:  n/a  Abdomen:  soft  Extremities:  warm   Intake/Output from previous day: 08/20 0701 - 08/21 0700 In: 588.9 [I.V.:588.9] Out: 1650 [Urine:1650] Intake/Output this shift: Total I/O In: 360 [P.O.:360] Out: 1350 [Urine:1350]  Lab Results: No results for input(s): WBC, HGB, HCT, PLT in the last 72 hours. BMET:  Recent Labs  08/21/16 0157 08/22/16 0517  NA 138 138  K 3.9 4.0  CL 100* 106  CO2 32 27  GLUCOSE 91 82  BUN 15 15  CREATININE 1.03* 0.88  CALCIUM 8.6* 8.7*    CBG (last 3)  No results for input(s): GLUCAP in the last 72 hours. PT/INR:   Recent Labs  08/21/16 0157  LABPROT 13.1  INR 0.99    CXR:  N/A  RIGHT/LEFT HEART CATH AND CORONARY ANGIOGRAPHY  Conclusion     Ost RCA to Prox RCA lesion, 10 %stenosed.  Dist RCA lesion, 30 %stenosed.  Prox Cx to Mid Cx lesion, 10 %stenosed.  Prox LAD to Mid LAD lesion, 30 %stenosed.   1. Patent stent in the Circumflex artery with minimal restenosis 2. Mild non-obstructive disease in the RCA and LAD (of note, there was spasm in the RCA with catheter engagement that resolved with SL NTG)  Recommendations: Medical management of CAD. Continue planning for valve repair/replacement.      Indications   Non-rheumatic mitral regurgitation [I34.0 (ICD-10-CM)]  Procedural Details/Technique   Technical Details Indication: 51 yo female with history of CAD s/p stenting of the Circumflex now with severe mitral regurgitation and recent recurrent CHF. She is being evaluated for mitral valve repair/replacement per Dr. Cornelius Moras.   Procedure: The risks, benefits, complications, treatment options, and expected outcomes were discussed with the patient. The patient and/or family concurred with the proposed plan, giving informed consent. The patient was brought to the cath lab after IV hydration was begun. The patient was further sedated with Versed and Fentanyl. The patient has had previous complication from the right groin with a retroperitoneal hemorrhage requiring surgical repair following cardiac cath in 2015. She has refused access in there right leg. She has had previous right radial artery spasm during cath. I elected to get access in the left groin. The left groin was prepped and draped in the usual manner. Using the modified Seldinger access technique, a 5 French sheath was placed in the left femoral artery anda 7 French sheath was placed in the left femoral vein. A balloon tipped catheter was used to perform a right heart catheterization. Standard diagnostic catheters were used to perform selective coronary angiography. A pigtail catheter was used to cross the aortic valve  into the LV. LV pressures measured. No LV gram performed.   There were no immediate complications. The patient was taken to the recovery area in stable condition.    Estimated blood loss <50 mL.  During this procedure the patient was administered the following to achieve and maintain moderate conscious sedation: Versed 2 mg, Fentanyl 100 mcg, while the patient's heart rate, blood pressure, and oxygen saturation were continuously monitored. The period of conscious sedation was 46 minutes, of which I was present  face-to-face 100% of this time.    Complications   Complications documented before study signed (08/21/2016 3:35 PM EDT)    RIGHT/LEFT HEART CATH AND CORONARY ANGIOGRAPHY   None Documented by Kathleene Hazel, MD 08/21/2016 3:35 PM EDT  Time Range: Intra-procedure      Coronary Findings   Dominance: Right  Left Anterior Descending  Vessel is large.  Prox LAD to Mid LAD lesion, 30% stenosed.  First Diagonal Branch  Vessel is moderate in size.  First Septal Branch  Vessel is small in size.  Second Diagonal Branch  Vessel is moderate in size.  Second Septal Branch  Vessel is small in size.  Third Diagonal Branch  Vessel is small in size.  Third Septal Branch  Vessel is small in size.  Left Circumflex  Vessel is large.  Prox Cx to Mid Cx lesion, 10% stenosed. The lesion was previously treated using a stent (unknown type) over 2 years ago.  First Obtuse Marginal Branch  Vessel is small in size.  Second Obtuse Marginal Branch  Vessel is large in size.  Third Obtuse Marginal Branch  Vessel is small in size.  Right Coronary Artery  Ost RCA to Prox RCA lesion, 10% stenosed.  Dist RCA lesion, 30% stenosed.  Coronary Diagrams   Diagnostic Diagram       Implants     No implant documentation for this case.  PACS Images   Show images for CARDIAC CATHETERIZATION   Link to Procedure Log   Procedure Log    Hemo Data    Most Recent Value  Fick Cardiac Output 3.63 L/min  Fick Cardiac Output Index 2.31 (L/min)/BSA  RA A Wave 11 mmHg  RA V Wave 8 mmHg  RA Mean 8 mmHg  RV Systolic Pressure 37 mmHg  RV Diastolic Pressure 8 mmHg  RV EDP 15 mmHg  PA Systolic Pressure 36 mmHg  PA Diastolic Pressure 16 mmHg  PA Mean 25 mmHg  PW A Wave 15 mmHg  PW V Wave 17 mmHg  PW Mean 15 mmHg  AO Systolic Pressure 108 mmHg  AO Diastolic Pressure 59 mmHg  AO Mean 78 mmHg  LV Systolic Pressure 106 mmHg  LV Diastolic Pressure 12 mmHg  LV EDP 19 mmHg  Arterial Occlusion  Pressure Extended Systolic Pressure 97 mmHg  Arterial Occlusion Pressure Extended Diastolic Pressure 56 mmHg  Arterial Occlusion Pressure Extended Mean Pressure 73 mmHg  Left Ventricular Apex Extended Systolic Pressure 104 mmHg  Left Ventricular Apex Extended Diastolic Pressure 12 mmHg  Left Ventricular Apex Extended EDP Pressure 18 mmHg  QP/QS 1  TPVR Index 10.82 HRUI  TSVR Index 33.77 HRUI  PVR SVR Ratio 0.14  TPVR/TSVR Ratio 0.32      Assessment/Plan:  I have personally reviewed Ms Scribner' cardiac catheterization which shows patient stent in LCx, otherwise moderate non-obstructive CAD and relatively mild elevation of pulmonary artery pressures.  Discussed options with patient.  Await dental extraction planned for tomorrow.  We tentatively plan mitral valve  repair/replacement on Friday 8/24.  Will get CTA chest/abd/pelvis to evaluate the feasibility of peripheral arterial cannulation for surgery.   I spent in excess of 15 minutes during the conduct of this hospital encounter and >50% of this time involved direct face-to-face encounter with the patient for counseling and/or coordination of their care.   Purcell Nails, MD 08/22/2016 4:31 PM

## 2016-08-22 NOTE — Progress Notes (Signed)
Patient having continuous BL leg cramps that have been unrelieved by walking/SCDs. Tylenol given but unable to give Ultram until 0000. Cardiology covering ordered to infuse 1g IV Mag and given Ultram early.

## 2016-08-23 ENCOUNTER — Inpatient Hospital Stay (HOSPITAL_COMMUNITY): Payer: Self-pay | Admitting: Certified Registered Nurse Anesthetist

## 2016-08-23 ENCOUNTER — Encounter (HOSPITAL_COMMUNITY): Payer: Self-pay | Admitting: *Deleted

## 2016-08-23 ENCOUNTER — Encounter (HOSPITAL_COMMUNITY)
Admission: AD | Disposition: A | Discharge status: Home / Self Care | Payer: Self-pay | Source: Other Acute Inpatient Hospital | Attending: Cardiology

## 2016-08-23 DIAGNOSIS — K0530 Chronic periodontitis, unspecified: Secondary | ICD-10-CM | POA: Diagnosis present

## 2016-08-23 DIAGNOSIS — K029 Dental caries, unspecified: Secondary | ICD-10-CM | POA: Diagnosis present

## 2016-08-23 DIAGNOSIS — K083 Retained dental root: Secondary | ICD-10-CM

## 2016-08-23 DIAGNOSIS — K0889 Other specified disorders of teeth and supporting structures: Secondary | ICD-10-CM | POA: Diagnosis present

## 2016-08-23 DIAGNOSIS — K08409 Partial loss of teeth, unspecified cause, unspecified class: Secondary | ICD-10-CM

## 2016-08-23 DIAGNOSIS — K045 Chronic apical periodontitis: Secondary | ICD-10-CM

## 2016-08-23 DIAGNOSIS — I059 Rheumatic mitral valve disease, unspecified: Secondary | ICD-10-CM

## 2016-08-23 HISTORY — PX: MULTIPLE EXTRACTIONS WITH ALVEOLOPLASTY: SHX5342

## 2016-08-23 LAB — URINALYSIS, COMPLETE (UACMP) WITH MICROSCOPIC
Bacteria, UA: NONE SEEN
Bilirubin Urine: NEGATIVE
HGB URINE DIPSTICK: NEGATIVE
KETONES UR: NEGATIVE mg/dL
LEUKOCYTES UA: NEGATIVE
Nitrite: NEGATIVE
PROTEIN: NEGATIVE mg/dL
Specific Gravity, Urine: 1.007 (ref 1.005–1.030)
pH: 7 (ref 5.0–8.0)

## 2016-08-23 SURGERY — MULTIPLE EXTRACTION WITH ALVEOLOPLASTY
Anesthesia: General

## 2016-08-23 MED ORDER — ONDANSETRON HCL 4 MG/2ML IJ SOLN
INTRAMUSCULAR | Status: AC
  Filled 2016-08-23: qty 4

## 2016-08-23 MED ORDER — PHENYLEPHRINE 40 MCG/ML (10ML) SYRINGE FOR IV PUSH (FOR BLOOD PRESSURE SUPPORT)
PREFILLED_SYRINGE | INTRAVENOUS | Status: DC | PRN
  Administered 2016-08-23: 80 ug via INTRAVENOUS
  Administered 2016-08-23 (×2): 120 ug via INTRAVENOUS

## 2016-08-23 MED ORDER — BUPIVACAINE-EPINEPHRINE (PF) 0.5% -1:200000 IJ SOLN
INTRAMUSCULAR | Status: AC
  Filled 2016-08-23: qty 3.6

## 2016-08-23 MED ORDER — PROPOFOL 10 MG/ML IV BOLUS
INTRAVENOUS | Status: DC | PRN
  Administered 2016-08-23: 80 mg via INTRAVENOUS

## 2016-08-23 MED ORDER — FENTANYL CITRATE (PF) 100 MCG/2ML IJ SOLN
INTRAMUSCULAR | Status: DC | PRN
  Administered 2016-08-23: 50 ug via INTRAVENOUS

## 2016-08-23 MED ORDER — BUPIVACAINE-EPINEPHRINE 0.5% -1:200000 IJ SOLN
INTRAMUSCULAR | Status: DC | PRN
  Administered 2016-08-23: 3.6 mL

## 2016-08-23 MED ORDER — SUGAMMADEX SODIUM 200 MG/2ML IV SOLN
INTRAVENOUS | Status: AC
  Filled 2016-08-23: qty 4

## 2016-08-23 MED ORDER — SUCCINYLCHOLINE CHLORIDE 200 MG/10ML IV SOSY
PREFILLED_SYRINGE | INTRAVENOUS | Status: AC
  Filled 2016-08-23: qty 10

## 2016-08-23 MED ORDER — ROCURONIUM BROMIDE 100 MG/10ML IV SOLN
INTRAVENOUS | Status: DC | PRN
  Administered 2016-08-23: 30 mg via INTRAVENOUS

## 2016-08-23 MED ORDER — DEXAMETHASONE SODIUM PHOSPHATE 10 MG/ML IJ SOLN
INTRAMUSCULAR | Status: DC | PRN
  Administered 2016-08-23: 10 mg via INTRAVENOUS

## 2016-08-23 MED ORDER — CEFAZOLIN SODIUM-DEXTROSE 2-4 GM/100ML-% IV SOLN: 2.0000 g | Freq: Once | INTRAVENOUS | Status: DC

## 2016-08-23 MED ORDER — ISOPROPYL ALCOHOL 70 % SOLN
Status: DC | PRN
  Administered 2016-08-23: 1 via TOPICAL

## 2016-08-23 MED ORDER — LIDOCAINE 2% (20 MG/ML) 5 ML SYRINGE
INTRAMUSCULAR | Status: AC
Start: 2016-08-23 — End: 2016-08-23
  Filled 2016-08-23: qty 5

## 2016-08-23 MED ORDER — SUCCINYLCHOLINE CHLORIDE 200 MG/10ML IV SOSY
PREFILLED_SYRINGE | INTRAVENOUS | Status: DC | PRN
  Administered 2016-08-23: 80 mg via INTRAVENOUS

## 2016-08-23 MED ORDER — ONDANSETRON HCL 4 MG/2ML IJ SOLN
INTRAMUSCULAR | Status: DC | PRN
  Administered 2016-08-23: 4 mg via INTRAVENOUS

## 2016-08-23 MED ORDER — LIDOCAINE-EPINEPHRINE 2 %-1:100000 IJ SOLN
INTRAMUSCULAR | Status: DC | PRN
  Administered 2016-08-23: 6.8 mL via INTRADERMAL

## 2016-08-23 MED ORDER — 0.9 % SODIUM CHLORIDE (POUR BTL) OPTIME
TOPICAL | Status: DC | PRN
  Administered 2016-08-23: 1000 mL

## 2016-08-23 MED ORDER — LACTATED RINGERS IV SOLN: INTRAVENOUS | Status: DC

## 2016-08-23 MED ORDER — ROCURONIUM BROMIDE 10 MG/ML (PF) SYRINGE
PREFILLED_SYRINGE | INTRAVENOUS | Status: AC
Start: 2016-08-23 — End: 2016-08-23
  Filled 2016-08-23: qty 5

## 2016-08-23 MED ORDER — PHENYLEPHRINE HCL 10 MG/ML IJ SOLN
INTRAVENOUS | Status: DC | PRN
  Administered 2016-08-23: 40 ug/min via INTRAVENOUS

## 2016-08-23 MED ORDER — LACTATED RINGERS IV SOLN
INTRAVENOUS | Status: DC
  Administered 2016-08-23: 09:00:00 via INTRAVENOUS

## 2016-08-23 MED ORDER — CLINDAMYCIN PHOSPHATE 600 MG/50ML IV SOLN
INTRAVENOUS | Status: AC
  Filled 2016-08-23: qty 50

## 2016-08-23 MED ORDER — FENTANYL CITRATE (PF) 250 MCG/5ML IJ SOLN
INTRAMUSCULAR | Status: AC
  Filled 2016-08-23: qty 5

## 2016-08-23 MED ORDER — LIDOCAINE 2% (20 MG/ML) 5 ML SYRINGE
INTRAMUSCULAR | Status: DC | PRN
  Administered 2016-08-23: 60 mg via INTRAVENOUS

## 2016-08-23 MED ORDER — MORPHINE SULFATE (PF) 2 MG/ML IV SOLN
2.0000 mg | Freq: Once | INTRAVENOUS | Status: AC
  Administered 2016-08-23: 2 mg via INTRAVENOUS
  Filled 2016-08-23: qty 1

## 2016-08-23 MED ORDER — SUGAMMADEX SODIUM 200 MG/2ML IV SOLN
INTRAVENOUS | Status: DC | PRN
  Administered 2016-08-23: 115 mg via INTRAVENOUS

## 2016-08-23 MED ORDER — OXYCODONE-ACETAMINOPHEN 5-325 MG PO TABS
1.0000 | ORAL_TABLET | ORAL | Status: DC | PRN
  Administered 2016-08-23 – 2016-08-24 (×7): 2 via ORAL
  Filled 2016-08-23 (×8): qty 2

## 2016-08-23 MED ORDER — MIDAZOLAM HCL 2 MG/2ML IJ SOLN
INTRAMUSCULAR | Status: DC | PRN
  Administered 2016-08-23: 1 mg via INTRAVENOUS

## 2016-08-23 MED ORDER — CEFAZOLIN SODIUM-DEXTROSE 2-4 GM/100ML-% IV SOLN
2.0000 g | Freq: Once | INTRAVENOUS | Status: DC
  Filled 2016-08-23 (×2): qty 100

## 2016-08-23 MED ORDER — ONDANSETRON HCL 4 MG/2ML IJ SOLN
4.0000 mg | Freq: Once | INTRAMUSCULAR | Status: AC
  Filled 2016-08-23: qty 2

## 2016-08-23 MED ORDER — LIDOCAINE-EPINEPHRINE 2 %-1:100000 IJ SOLN
INTRAMUSCULAR | Status: AC
  Filled 2016-08-23: qty 10.2

## 2016-08-23 MED ORDER — FENTANYL CITRATE (PF) 100 MCG/2ML IJ SOLN: 25.0000 ug | INTRAMUSCULAR | Status: DC | PRN

## 2016-08-23 MED ORDER — MIDAZOLAM HCL 2 MG/2ML IJ SOLN
INTRAMUSCULAR | Status: AC
  Filled 2016-08-23: qty 2

## 2016-08-23 MED ORDER — CLINDAMYCIN PHOSPHATE 600 MG/50ML IV SOLN
INTRAVENOUS | Status: DC | PRN
  Administered 2016-08-23: 600 mg via INTRAVENOUS

## 2016-08-23 SURGICAL SUPPLY — 37 items
ALCOHOL 70% 16 OZ (MISCELLANEOUS) ×3 IMPLANT
ATTRACTOMAT 16X20 MAGNETIC DRP (DRAPES) ×3 IMPLANT
BLADE SURG 15 STRL LF DISP TIS (BLADE) ×2 IMPLANT
BLADE SURG 15 STRL SS (BLADE) ×6
COVER SURGICAL LIGHT HANDLE (MISCELLANEOUS) ×3 IMPLANT
GAUZE PACKING FOLDED 2  STR (GAUZE/BANDAGES/DRESSINGS) ×2
GAUZE PACKING FOLDED 2 STR (GAUZE/BANDAGES/DRESSINGS) ×1 IMPLANT
GAUZE SPONGE 4X4 16PLY XRAY LF (GAUZE/BANDAGES/DRESSINGS) ×3 IMPLANT
GLOVE BIOGEL PI IND STRL 6 (GLOVE) ×1 IMPLANT
GLOVE BIOGEL PI INDICATOR 6 (GLOVE) ×2
GLOVE SURG ORTHO 8.0 STRL STRW (GLOVE) ×3 IMPLANT
GLOVE SURG SS PI 6.0 STRL IVOR (GLOVE) ×3 IMPLANT
GOWN STRL REUS W/ TWL LRG LVL3 (GOWN DISPOSABLE) ×1 IMPLANT
GOWN STRL REUS W/TWL 2XL LVL3 (GOWN DISPOSABLE) ×3 IMPLANT
GOWN STRL REUS W/TWL LRG LVL3 (GOWN DISPOSABLE) ×3
HEMOSTAT SURGICEL 2X14 (HEMOSTASIS) ×3 IMPLANT
KIT BASIN OR (CUSTOM PROCEDURE TRAY) ×3 IMPLANT
KIT ROOM TURNOVER OR (KITS) ×3 IMPLANT
MANIFOLD NEPTUNE WASTE (CANNULA) ×3 IMPLANT
NDL BLUNT 16X1.5 OR ONLY (NEEDLE) ×1 IMPLANT
NEEDLE BLUNT 16X1.5 OR ONLY (NEEDLE) ×3 IMPLANT
NS IRRIG 1000ML POUR BTL (IV SOLUTION) ×3 IMPLANT
PACK EENT II TURBAN DRAPE (CUSTOM PROCEDURE TRAY) ×3 IMPLANT
PAD ARMBOARD 7.5X6 YLW CONV (MISCELLANEOUS) ×3 IMPLANT
SPONGE SURGIFOAM ABS GEL 100 (HEMOSTASIS) IMPLANT
SPONGE SURGIFOAM ABS GEL 12-7 (HEMOSTASIS) IMPLANT
SPONGE SURGIFOAM ABS GEL SZ50 (HEMOSTASIS) IMPLANT
SUCTION FRAZIER HANDLE 10FR (MISCELLANEOUS) ×2
SUCTION TUBE FRAZIER 10FR DISP (MISCELLANEOUS) ×1 IMPLANT
SUT CHROMIC 3 0 PS 2 (SUTURE) ×6 IMPLANT
SUT CHROMIC 4 0 P 3 18 (SUTURE) IMPLANT
SYR 50ML SLIP (SYRINGE) ×3 IMPLANT
TOWEL OR 17X26 10 PK STRL BLUE (TOWEL DISPOSABLE) ×3 IMPLANT
TUBE CONNECTING 12'X1/4 (SUCTIONS) ×1
TUBE CONNECTING 12X1/4 (SUCTIONS) ×2 IMPLANT
WATER TABLETS ICX (MISCELLANEOUS) ×3 IMPLANT
YANKAUER SUCT BULB TIP NO VENT (SUCTIONS) ×3 IMPLANT

## 2016-08-23 NOTE — Op Note (Signed)
OPERATIVE REPORT  Patient:            Lindsay Camacho Date of Birth:  [birth year] MRN:                [id number]   DATE OF PROCEDURE:  08/23/2016  PREOPERATIVE DIAGNOSES: 1. Severe mitral regurgitation 2. Pre-heart valve surgery dental protocol 3. Chronic apical periodontitis 4. Retained root segment 5. Dental caries 6. Chronic periodontitis 7. Loose teeth  POSTOPERATIVE DIAGNOSES: 1. Severe mitral regurgitation 2. Pre-heart valve surgery dental protocol 3. Chronic apical periodontitis 4. Retained root segment 5. Dental caries 6. Chronic periodontitis 7. Loose teeth  OPERATIONS: 1. Multiple extraction of tooth numbers 17,20-23, and 26-29 2. 2 Quadrants of alveoloplasty   SURGEON: [name], DDS  ASSISTANT: Rory Percy, (dental assistant)  ANESTHESIA: General anesthesia via oral endotracheal tube.  MEDICATIONS: 1. Clindamycin 600 mg IV prior to invasive dental procedures. 2. Local anesthesia with a total utilization of 2 carpules each containing 34 mg of lidocaine with 0.017 mg of epinephrine as well as 2 carpules each containing 9 mg of bupivacaine with 0.009 mg of epinephrine.  SPECIMENS: There are 9 teeth that were discarded.  DRAINS: None  CULTURES: None  COMPLICATIONS: None   ESTIMATED BLOOD LOSS: 50 mLs.  INTRAVENOUS FLUIDS: 300 mLs of Lactated ringers solution.  INDICATIONS: The patient was recently diagnosed with severe mitral regurgitation.  A medically necessary dental consultation was then requested to evaluate poor dentition.  The patient was examined and treatment planned for multiple extractions with alveoloplasty in the operating room with general anesthesia.  This treatment plan was formulated to decrease the risks and complications associated with dental infection from affecting the patient's systemic health and the anticipated heart valve surgery.  OPERATIVE FINDINGS: Patient was examined operating room number 10.  The teeth were  identified for extraction. The patient was noted be affected by chronic periodontitis, chronic apical periodontitis, dental caries, retained root segment, and loose teeth.   DESCRIPTION OF PROCEDURE: Patient was brought to the main operating room number 10. Patient was then placed in the supine position on the operating table. General anesthesia was then induced per the anesthesia team. The patient was then prepped and draped in the usual manner for dental medicine procedure. A timeout was performed. The patient was identified and procedures were verified. A throat pack was placed at this time. The oral cavity was then thoroughly examined with the findings noted above. The patient was then ready for dental medicine procedure as follows:  Local anesthesia was then administered sequentially with a total utilization of 2 carpules each containing 34 mg of lidocaine with 0.017 mg of epinephrine as well as 2 carpules  each containing 9 mg bupivacaine with 0.009 mg of epinephrine.  At this point time, the mandibular quadrants were approached. The patient was given bilateral inferior alveolar nerve blocks and long buccal nerve blocks utilizing the bupivacaine with epinephrine. Further infiltration was then achieved utilizing the lidocaine with epinephrine. A 15 blade incision was then made from the distal of number 17 and extended to the distal of #31.  A surgical flap was then carefully reflected. The lower teeth were then subluxated with a series of straight elevators.  Tooth numbers 17, 20-23, and 25-29 were then removed utilizing a 151 forceps without complications. Alveoloplasty was then performed utilizing a rongeurs and bone file. The tissues were approximated and trimmed appropriately. The surgical sites were then irrigated with copious amounts of sterile saline. The mandibular left surgical  site was then closed from the distal of  #17 and extended the mesial #24 utilizing 3-0 chromic gut suture in a  continuous interrupted suture technique 1. The mandibular right surgical site from the distal of #31 and extended the mesial #25 utilizing 3-0 chromic gut suture in a continuous interrupted suture technique 1.  At this point time, the entire mouth was irrigated with copious amounts of sterile saline. The patient was examined for complications, seeing none, the dental medicine procedure was deemed to be complete. The throat pack was removed at this time. An oral airway was then placed at the request of the anesthesia team. A series of 4 x 4 gauze were placed in the mouth to aid hemostasis. The patient was then handed over to the anesthesia team for final disposition. After an appropriate amount of time, the patient was extubated and taken to the postanesthsia care unit in good condition. All counts were correct for the dental medicine procedure.  Dr. Cornelius Moras is to proceed with heart valve surgery as indicated later this week.   [name], DDS.

## 2016-08-23 NOTE — Care Management Note (Signed)
Case Management Note Donn Pierini RN, BSN Unit 4E-Case Manager-- 3W coverage [phone]  Patient Details  Name: Lindsay Camacho MRN: [id number] Date of Birth: August 06, 1965  Subjective/Objective:  Pt admitted  With severe MR, HF- cath done 8/20- plan for MVR this week ?friday 8/24              Action/Plan: PTA pt lived at home-lives in [place]-   CM to follow post op for d/c needs  Expected Discharge Date:                  Expected Discharge Plan:     In-House Referral:     Discharge planning Services  CM Consult  Post Acute Care Choice:    Choice offered to:     DME Arranged:    DME Agency:     HH Arranged:    HH Agency:     Status of Service:  In process, will continue to follow  If discussed at Long Length of Stay Meetings, dates discussed:   Discharge Disposition:    Additional Comments:  Darrold Span, RN 08/23/2016, 10:10 PM

## 2016-08-23 NOTE — Anesthesia Preprocedure Evaluation (Addendum)
Anesthesia Evaluation  Patient identified by MRN, date of birth, ID band Patient awake    Reviewed: Allergy & Precautions, NPO status , Patient's Chart, lab work & pertinent test results  Airway Mallampati: II  TM Distance: <3 FB Neck ROM: Full  Mouth opening: Limited Mouth Opening  Dental  (+) Poor Dentition, Edentulous Upper   Pulmonary former smoker,    breath sounds clear to auscultation       Cardiovascular hypertension, + CAD, + Past MI, + Peripheral Vascular Disease and +CHF  + dysrhythmias + Valvular Problems/Murmurs MR  Rhythm:Regular Rate:Normal + Systolic murmurs    Neuro/Psych CVA    GI/Hepatic negative GI ROS, Neg liver ROS,   Endo/Other    Renal/GU negative Renal ROS     Musculoskeletal   Abdominal   Peds  Hematology  (+) anemia ,   Anesthesia Other Findings   Reproductive/Obstetrics                            Anesthesia Physical Anesthesia Plan  ASA: IV  Anesthesia Plan: General   Post-op Pain Management:    Induction: Intravenous  PONV Risk Score and Plan: 3 and Ondansetron, Dexamethasone, Midazolam and Propofol infusion  Airway Management Planned: Oral ETT  Additional Equipment:   Intra-op Plan:   Post-operative Plan: Extubation in OR and Possible Post-op intubation/ventilation  Informed Consent: I have reviewed the patients History and Physical, chart, labs and discussed the procedure including the risks, benefits and alternatives for the proposed anesthesia with the patient or authorized representative who has indicated his/her understanding and acceptance.     Plan Discussed with: CRNA  Anesthesia Plan Comments:         Anesthesia Quick Evaluation

## 2016-08-23 NOTE — Anesthesia Postprocedure Evaluation (Signed)
Anesthesia Post Note  Patient: Lindsay Camacho  Procedure(s) Performed: Procedure(s) (LRB): Extraction of tooth #'s 17, 20-23, 26-29 with alveoloplasty (N/A)     Patient location during evaluation: PACU Anesthesia Type: General Level of consciousness: awake and sedated Pain management: pain level controlled Vital Signs Assessment: post-procedure vital signs reviewed and stable Respiratory status: spontaneous breathing, nonlabored ventilation, respiratory function stable and patient connected to nasal cannula oxygen Cardiovascular status: blood pressure returned to baseline and stable Postop Assessment: no signs of nausea or vomiting Anesthetic complications: no    Last Vitals:  Vitals:   08/23/16 1041 08/23/16 1100  BP: (!) 98/46   Pulse: 64 68  Resp: 13 13  Temp:    SpO2: 98% 95%    Last Pain:  Vitals:   08/23/16 0444  TempSrc: Oral  PainSc:                  Brisa Auth,JAMES TERRILL

## 2016-08-23 NOTE — Progress Notes (Signed)
   Dental work performed. Planned surgery Friday - Dr. Cornelius Moras No other changes Donato Schultz, MD

## 2016-08-23 NOTE — Progress Notes (Signed)
PRE-OPERATIVE NOTE:  08/23/2016 Lindsay Camacho [id number]  VITALS: BP (!) 90/51 (BP Location: Right Arm)   Pulse (!) 56   Temp 97.8 F (36.6 C) (Oral)   Resp 14   Ht 5\' 1"  (1.549 m)   Wt 126 lb 3.2 oz (57.2 kg)   SpO2 94%   BMI 23.85 kg/m   Lab Results  Component Value Date   WBC 5.8 08/17/2016   HGB 12.1 08/17/2016   HCT 37.1 08/17/2016   MCV 87.3 08/17/2016   PLT 212 08/17/2016   BMET    Component Value Date/Time   NA 138 08/22/2016 0517   NA 141 04/04/2016 0917   NA 138 07/05/2011 1145   K 4.0 08/22/2016 0517   K 3.8 07/05/2011 1145   CL 106 08/22/2016 0517   CL 109 (H) 07/05/2011 1145   CO2 27 08/22/2016 0517   CO2 25 07/05/2011 1145   GLUCOSE 82 08/22/2016 0517   GLUCOSE 81 07/05/2011 1145   BUN 15 08/22/2016 0517   BUN 9 04/04/2016 0917   BUN 15 07/05/2011 1145   CREATININE 0.88 08/22/2016 0517   CREATININE 0.85 10/27/2015 1430   CALCIUM 8.7 (L) 08/22/2016 0517   CALCIUM 8.4 (L) 07/05/2011 1145   GFRNONAA >60 08/22/2016 0517   GFRNONAA >60 07/05/2011 1145   GFRAA >60 08/22/2016 0517   GFRAA >60 07/05/2011 1145    Lab Results  Component Value Date   INR 0.99 08/21/2016   INR 1.10 09/17/2014   INR 1.10 01/08/2014   No results found for: PTT   Lindsay Camacho presents for multiple dental extractions with alveoloplasty in the operating room with general anesthesia.   SUBJECTIVE: The patient denies any acute medical or dental changes and agrees to proceed with treatment as planned.  EXAM: No sign of acute dental changes.  ASSESSMENT: Patient is affected by chronic apical periodontitis, dental caries, chronic periodontitis, and loose teeth.  PLAN: Patient agrees to proceed with treatment as planned in the operating room as previously discussed and accepts the risks, benefits, and complications of the proposed treatment. Patient is aware of the risk for bleeding, bruising, swelling, infection, pain, nerve damage, soft tissue damage, sinus  involvement, root tip fracture, mandible fracture, and the risks of complications associated with the anesthesia. Patient also is aware of the potential for other complications up to and including death due to overall cardiovascular and medical compromise.   [name], DDS

## 2016-08-23 NOTE — Transfer of Care (Signed)
Immediate Anesthesia Transfer of Care Note  Patient: Lindsay Camacho  Procedure(s) Performed: Procedure(s): Extraction of tooth #'s 17, 20-23, 26-29 with alveoloplasty (N/A)  Patient Location: PACU  Anesthesia Type:General  Level of Consciousness: awake and patient cooperative  Airway & Oxygen Therapy: Patient Spontanous Breathing and Patient connected to nasal cannula oxygen  Post-op Assessment: Report given to RN, Post -op Vital signs reviewed and stable and Patient moving all extremities  Post vital signs: Reviewed and stable  Last Vitals:  Vitals:   08/23/16 0800 08/23/16 1025  BP:    Pulse:  68  Resp:  13  Temp:  36.5 C  SpO2: 94% 98%    Last Pain:  Vitals:   08/23/16 0444  TempSrc: Oral  PainSc:       Patients Stated Pain Goal: 4 (08/22/16 0835)  Complications: No apparent anesthesia complications

## 2016-08-24 ENCOUNTER — Encounter (HOSPITAL_COMMUNITY): Payer: Self-pay | Admitting: Dentistry

## 2016-08-24 ENCOUNTER — Inpatient Hospital Stay (HOSPITAL_COMMUNITY): Payer: Self-pay

## 2016-08-24 DIAGNOSIS — Z01818 Encounter for other preprocedural examination: Secondary | ICD-10-CM

## 2016-08-24 DIAGNOSIS — I059 Rheumatic mitral valve disease, unspecified: Secondary | ICD-10-CM

## 2016-08-24 LAB — COMPREHENSIVE METABOLIC PANEL
ALBUMIN: 3.3 g/dL — AB (ref 3.5–5.0)
ALT: 30 U/L (ref 14–54)
ANION GAP: 7 (ref 5–15)
AST: 30 U/L (ref 15–41)
Alkaline Phosphatase: 70 U/L (ref 38–126)
BILIRUBIN TOTAL: 0.9 mg/dL (ref 0.3–1.2)
BUN: 11 mg/dL (ref 6–20)
CO2: 28 mmol/L (ref 22–32)
Calcium: 9 mg/dL (ref 8.9–10.3)
Chloride: 100 mmol/L — ABNORMAL LOW (ref 101–111)
Creatinine, Ser: 0.84 mg/dL (ref 0.44–1.00)
GLUCOSE: 155 mg/dL — AB (ref 65–99)
POTASSIUM: 4 mmol/L (ref 3.5–5.1)
Sodium: 135 mmol/L (ref 135–145)
TOTAL PROTEIN: 7.1 g/dL (ref 6.5–8.1)

## 2016-08-24 LAB — CBC
HEMATOCRIT: 34 % — AB (ref 36.0–46.0)
Hemoglobin: 10.8 g/dL — ABNORMAL LOW (ref 12.0–15.0)
MCH: 28 pg (ref 26.0–34.0)
MCHC: 31.8 g/dL (ref 30.0–36.0)
MCV: 88.1 fL (ref 78.0–100.0)
Platelets: 230 10*3/uL (ref 150–400)
RBC: 3.86 MIL/uL — ABNORMAL LOW (ref 3.87–5.11)
RDW: 14.6 % (ref 11.5–15.5)
WBC: 8.2 10*3/uL (ref 4.0–10.5)

## 2016-08-24 LAB — BLOOD GAS, ARTERIAL
ACID-BASE EXCESS: 1.9 mmol/L (ref 0.0–2.0)
BICARBONATE: 26.1 mmol/L (ref 20.0–28.0)
Drawn by: 50222
FIO2: 0.21
O2 SAT: 93.2 %
PCO2 ART: 42 mmHg (ref 32.0–48.0)
PH ART: 7.409 (ref 7.350–7.450)
PO2 ART: 68.3 mmHg — AB (ref 83.0–108.0)
Patient temperature: 98.6

## 2016-08-24 LAB — HEMOGLOBIN A1C
Hgb A1c MFr Bld: 4.7 % — ABNORMAL LOW (ref 4.8–5.6)
MEAN PLASMA GLUCOSE: 88.19 mg/dL

## 2016-08-24 LAB — PLATELET INHIBITION P2Y12: Platelet Function  P2Y12: 257 [PRU] (ref 194–418)

## 2016-08-24 LAB — APTT: aPTT: 31 seconds (ref 24–36)

## 2016-08-24 LAB — PROTIME-INR
INR: 1.07
Prothrombin Time: 13.9 seconds (ref 11.4–15.2)

## 2016-08-24 MED ORDER — KENNESTONE BLOOD CARDIOPLEGIA VIAL
13.0000 mL | Freq: Once | Status: DC
  Filled 2016-08-24: qty 13

## 2016-08-24 MED ORDER — TRANEXAMIC ACID (OHS) BOLUS VIA INFUSION
15.0000 mg/kg | INTRAVENOUS | Status: AC
  Administered 2016-08-25: 874.5 mg via INTRAVENOUS
  Filled 2016-08-24: qty 875

## 2016-08-24 MED ORDER — DOPAMINE-DEXTROSE 3.2-5 MG/ML-% IV SOLN
0.0000 ug/kg/min | INTRAVENOUS | Status: AC
  Administered 2016-08-25: 3 ug/kg/min via INTRAVENOUS
  Filled 2016-08-24: qty 250

## 2016-08-24 MED ORDER — PHENYLEPHRINE HCL 10 MG/ML IJ SOLN
30.0000 ug/min | INTRAMUSCULAR | Status: AC
  Administered 2016-08-25: 15 ug/min via INTRAVENOUS
  Filled 2016-08-24: qty 2

## 2016-08-24 MED ORDER — MAGNESIUM SULFATE 50 % IJ SOLN
40.0000 meq | INTRAMUSCULAR | Status: DC
  Filled 2016-08-24: qty 10

## 2016-08-24 MED ORDER — BISACODYL 5 MG PO TBEC
5.0000 mg | DELAYED_RELEASE_TABLET | Freq: Once | ORAL | Status: AC
  Administered 2016-08-24: 5 mg via ORAL
  Filled 2016-08-24: qty 1

## 2016-08-24 MED ORDER — POTASSIUM CHLORIDE 2 MEQ/ML IV SOLN
80.0000 meq | INTRAVENOUS | Status: DC
  Filled 2016-08-24: qty 40

## 2016-08-24 MED ORDER — CHLORHEXIDINE GLUCONATE 4 % EX LIQD
60.0000 mL | Freq: Once | CUTANEOUS | Status: AC
  Administered 2016-08-25: 4 via TOPICAL
  Filled 2016-08-24: qty 60

## 2016-08-24 MED ORDER — VANCOMYCIN HCL 1000 MG IV SOLR
INTRAVENOUS | Status: AC
  Administered 2016-08-25: 1000 mL
  Filled 2016-08-24: qty 1000

## 2016-08-24 MED ORDER — TEMAZEPAM 15 MG PO CAPS
15.0000 mg | ORAL_CAPSULE | Freq: Once | ORAL | Status: AC | PRN
  Administered 2016-08-24: 15 mg via ORAL
  Filled 2016-08-24: qty 1

## 2016-08-24 MED ORDER — KENNESTONE BLOOD CARDIOPLEGIA (KBC) MANNITOL SYRINGE (20%, 32ML)
32.0000 mL | Freq: Once | INTRAVENOUS | Status: DC
  Filled 2016-08-24: qty 32

## 2016-08-24 MED ORDER — METOPROLOL TARTRATE 12.5 MG HALF TABLET
12.5000 mg | ORAL_TABLET | Freq: Once | ORAL | Status: AC
  Administered 2016-08-25: 12.5 mg via ORAL
  Filled 2016-08-24: qty 1

## 2016-08-24 MED ORDER — CHLORHEXIDINE GLUCONATE 0.12 % MT SOLN
15.0000 mL | Freq: Once | OROMUCOSAL | Status: AC
  Administered 2016-08-25: 15 mL via OROMUCOSAL
  Filled 2016-08-24: qty 15

## 2016-08-24 MED ORDER — LEVOFLOXACIN IN D5W 500 MG/100ML IV SOLN
500.0000 mg | INTRAVENOUS | Status: AC
  Administered 2016-08-25: 500 mg via INTRAVENOUS
  Filled 2016-08-24: qty 100

## 2016-08-24 MED ORDER — INSULIN REGULAR HUMAN 100 UNIT/ML IJ SOLN
INTRAMUSCULAR | Status: AC
  Administered 2016-08-25: .5 [IU]/h via INTRAVENOUS
  Filled 2016-08-24 (×2): qty 1

## 2016-08-24 MED ORDER — DEXMEDETOMIDINE HCL IN NACL 400 MCG/100ML IV SOLN
0.1000 ug/kg/h | INTRAVENOUS | Status: AC
  Administered 2016-08-25: .3 ug/kg/h via INTRAVENOUS
  Filled 2016-08-24: qty 100

## 2016-08-24 MED ORDER — TRANEXAMIC ACID (OHS) PUMP PRIME SOLUTION
2.0000 mg/kg | INTRAVENOUS | Status: DC
  Filled 2016-08-24: qty 1.17

## 2016-08-24 MED ORDER — SODIUM CHLORIDE 0.9 % IV SOLN
1250.0000 mg | INTRAVENOUS | Status: AC
  Administered 2016-08-25: 1250 mg via INTRAVENOUS
  Filled 2016-08-24: qty 1250

## 2016-08-24 MED ORDER — ALPRAZOLAM 0.5 MG PO TABS
0.5000 mg | ORAL_TABLET | Freq: Every day | ORAL | Status: AC
  Administered 2016-08-24: 0.5 mg via ORAL
  Filled 2016-08-24: qty 1

## 2016-08-24 MED ORDER — CHLORHEXIDINE GLUCONATE 4 % EX LIQD
60.0000 mL | Freq: Once | CUTANEOUS | Status: AC
  Administered 2016-08-24: 4 via TOPICAL
  Filled 2016-08-24: qty 60

## 2016-08-24 MED ORDER — PLASMA-LYTE 148 IV SOLN
INTRAVENOUS | Status: DC
  Filled 2016-08-24: qty 2.5

## 2016-08-24 MED ORDER — SODIUM CHLORIDE 0.9 % IV SOLN
1.5000 mg/kg/h | INTRAVENOUS | Status: AC
  Administered 2016-08-25: 1.5 mg/kg/h via INTRAVENOUS
  Filled 2016-08-24: qty 25

## 2016-08-24 MED ORDER — NITROGLYCERIN IN D5W 200-5 MCG/ML-% IV SOLN
2.0000 ug/min | INTRAVENOUS | Status: DC
  Filled 2016-08-24: qty 250

## 2016-08-24 MED ORDER — EPINEPHRINE PF 1 MG/ML IJ SOLN
0.0000 ug/min | INTRAVENOUS | Status: DC
  Filled 2016-08-24: qty 4

## 2016-08-24 MED ORDER — SODIUM CHLORIDE 0.9 % IR SOLN: 200.0000 mL | Status: DC

## 2016-08-24 MED ORDER — SODIUM CHLORIDE 0.9 % IV SOLN
INTRAVENOUS | Status: DC
  Filled 2016-08-24: qty 30

## 2016-08-24 NOTE — Plan of Care (Signed)
Problem: Education: Goal: Knowledge of [facility] Education information/materials will improve Outcome: Progressing Pain Management

## 2016-08-24 NOTE — Plan of Care (Signed)
Problem: Physical Regulation: Goal: Ability to maintain clinical measurements within normal limits will improve Outcome: Progressing Progressively through shift patient having an easier time communicating post oral surgery. No oral bleeding, tolerating clear liquids. PRN pain medication given. Patient given melatonin but did not feel she slept during shift. Patient noted this morning she would like something for her nerves.

## 2016-08-24 NOTE — Progress Notes (Signed)
[address]       Lindsay Camacho [address]             [phone]     CARDIOTHORACIC SURGERY PROGRESS NOTE  1 Day Post-Op  S/P Procedure(s) (LRB): Extraction of tooth #'s 17, 20-23, 26-29 with alveoloplasty (N/A)  Subjective: No complaints except mouth is a little sore  Objective: Vital signs in last 24 hours: Temp:  [97.4 F (36.3 C)-97.6 F (36.4 C)] 97.5 F (36.4 C) (08/23 1307) Pulse Rate:  [60-73] 60 (08/23 1307) Cardiac Rhythm: Normal sinus rhythm;Bundle branch block (08/23 0700) Resp:  [12-16] 13 (08/23 1307) BP: (93-104)/(52-67) 104/67 (08/23 1307) SpO2:  [90 %-97 %] 97 % (08/23 1307) Weight:  [128 lb 8 oz (58.3 kg)] 128 lb 8 oz (58.3 kg) (08/23 0420)  Physical Exam:  Rhythm:   sinus  Breath sounds: clear  Heart sounds:  RRR w/ murmur  Incisions:  n/a  Abdomen:  soft  Extremities:  warm   Intake/Output from previous day: 08/22 0701 - 08/23 0700 In: 650 [P.O.:350; I.V.:300] Out: 1550 [Urine:1500; Blood:50] Intake/Output this shift: Total I/O In: 240 [P.O.:240] Out: -   Lab Results:  Recent Labs  08/24/16 0241  WBC 8.2  HGB 10.8*  HCT 34.0*  PLT 230   BMET:  Recent Labs  08/22/16 0517 08/24/16 0241  NA 138 135  K 4.0 4.0  CL 106 100*  CO2 27 28  GLUCOSE 82 155*  BUN 15 11  CREATININE 0.88 0.84  CALCIUM 8.7* 9.0    CBG (last 3)  No results for input(s): GLUCAP in the last 72 hours. PT/INR:   Recent Labs  08/24/16 0241  LABPROT 13.9  INR 1.07    CXR:  CHEST  2 VIEW  COMPARISON:  08/17/2016  FINDINGS: Mild cardiomegaly. Lungs are clear. No effusions or edema. No acute bony abnormality.  IMPRESSION: Cardiomegaly.  No active disease.   Electronically Signed   By: Charlett Nose M.D.   On: 08/24/2016 07:24   CT ANGIOGRAPHY CHEST, ABDOMEN AND PELVIS  TECHNIQUE: Multidetector CT imaging through the chest, abdomen and pelvis was performed using the standard protocol during bolus administration  of intravenous contrast. Multiplanar reconstructed images and MIPs were obtained and reviewed to evaluate the vascular anatomy.  CONTRAST:  100 mL of Isovue 370 intravenously.  COMPARISON:  CT scan of September 22, 2014.  FINDINGS: CTA CHEST FINDINGS  Cardiovascular: Atherosclerosis of thoracic aorta is noted without aneurysm or dissection. Mild eccentric plaque is noted involving the proximal portion of the right innominate artery. Left common carotid artery is widely patent without stenosis. Severe stenosis is noted in the proximal left subclavian artery. Mild cardiomegaly is noted without pericardial effusion.  Mediastinum/Nodes: No enlarged mediastinal, hilar, or axillary lymph nodes. Thyroid gland, trachea, and esophagus demonstrate no significant findings.  Lungs/Pleura: Lungs are clear. No pleural effusion or pneumothorax.  Musculoskeletal: No chest wall abnormality. No acute or significant osseous findings.  Review of the MIP images confirms the above findings.  CTA ABDOMEN AND PELVIS FINDINGS  VASCULAR  Aorta: Atherosclerosis of abdominal aorta is noted without aneurysm or dissection.  Celiac: Patent without evidence of aneurysm, dissection, vasculitis or significant stenosis.  SMA: Shares common origin with celiac artery. No significant stenosis of thrombus is noted.  Renals: Both renal arteries are patent without evidence of aneurysm, dissection, vasculitis, fibromuscular dysplasia or significant stenosis.  IMA: Patent without evidence of aneurysm, dissection, vasculitis or significant stenosis.  Inflow: Extensive  atherosclerosis is noted moderate focal stenosis is noted at the origin of the left common iliac artery. Left external iliac artery is widely patent. Long length eccentric plaque is noted in the right common iliac artery which results in moderate stenosis. Right external iliac artery is widely patent.  Veins: No obvious  venous abnormality within the limitations of this arterial phase study.  Review of the MIP images confirms the above findings.  NON-VASCULAR  Hepatobiliary: No focal liver abnormality is seen. No gallstones, gallbladder wall thickening, or biliary dilatation.  Pancreas: Unremarkable. No pancreatic ductal dilatation or surrounding inflammatory changes.  Spleen: Normal in size without focal abnormality.  Adrenals/Urinary Tract: Adrenal glands are unremarkable. Kidneys are normal, without renal calculi, focal lesion, or hydronephrosis. Bladder is unremarkable.  Stomach/Bowel: There is no evidence of bowel obstruction or inflammation. The stomach appears normal. The appendix is not identified.  Lymphatic: No significant adenopathy is noted.  Reproductive: Uterus and bilateral adnexa are unremarkable.  Other: Minimal amount of free fluid is noted in the posterior pelvis which most likely is physiologic.  Musculoskeletal: No acute or significant osseous findings.  Review of the MIP images confirms the above findings.  IMPRESSION: Atherosclerosis of thoracic and abdominal aorta is noted without aneurysm or dissection.  Severe stenosis is seen involving the proximal left subclavian artery.  No significant mesenteric or renal artery stenosis is noted.  Long length eccentric plaque is noted in right common iliac artery resulting in moderate stenosis. Moderate focal stenosis is noted at the origin of the left common iliac artery secondary to calcified atheromatous disease.   Electronically Signed   By: Lupita Raider, M.D.   On: 08/22/2016 20:26   Assessment/Plan: S/P Procedure(s) (LRB): Extraction of tooth #'s 17, 20-23, 26-29 with alveoloplasty (N/A)  The patient was again counseled at length regarding the indications, risks and potential benefits of mitral valve repair.  The rationale for elective surgery has been explained, including a comparison  between surgery and continued medical therapy with close follow-up.  The likelihood of successful and durable valve repair has been discussed with particular reference to the findings of their recent echocardiogram.  Based upon these findings and previous experience, I have quoted them a greater than 80 percent likelihood of successful valve repair.  In the unlikely event that their valve cannot be successfully repaired, we discussed the possibility of replacing the mitral valve using a mechanical prosthesis with the attendant need for long-term anticoagulation versus the alternative of replacing it using a bioprosthetic tissue valve with its potential for late structural valve deterioration and failure, depending upon the patient's longevity.  The patient specifically requests that if the mitral valve must be replaced that it be done using a bioprosthetic tissue valve.   The patient understands and accepts all potential risks of surgery including but not limited to risk of death, stroke or other neurologic complication, myocardial infarction, congestive heart failure, respiratory failure, renal failure, bleeding requiring transfusion and/or reexploration, arrhythmia, infection or other wound complications, pneumonia, pleural and/or pericardial effusion, pulmonary embolus, aortic dissection or other major vascular complication, or delayed complications related to valve repair or replacement including but not limited to structural valve deterioration and failure, thrombosis, embolization, endocarditis, or paravalvular leak.  Alternative surgical approaches have been discussed including a comparison between conventional sternotomy and minimally-invasive techniques.  The relative risks and benefits of each have been reviewed as they pertain to the patient's specific circumstances, and all of their questions have been addressed.  Specific risks potentially  related to the minimally-invasive approach were discussed at  length, including but not limited to risk of conversion to full or partial sternotomy, aortic dissection or other major vascular complication, unilateral acute lung injury or pulmonary edema, phrenic nerve dysfunction or paralysis, rib fracture, chronic pain, lung hernia, or lymphocele.   Results of CTA reviewed with patient including the presence of significant aortoiliac occlusive disease and left subclavian stenosis.  Under the circumstances it would likely be best to avoid femoral artery cannulation and we will plan right axillary artery cannulation instead.  The associated risks/benefits were discussed, including the potential for injury to the right brachial plexus.  All of her questions have been answered.  I spent in excess of 15 minutes during the conduct of this hospital encounter and >50% of this time involved direct face-to-face encounter with the patient for counseling and/or coordination of their care.    Purcell Nails, MD 08/24/2016 5:22 PM

## 2016-08-24 NOTE — Progress Notes (Signed)
POST OPERATIVE NOTE:  08/24/2016 [name] [id number]  VITALS: BP (!) 102/59 (BP Location: Right Arm)   Pulse 62   Temp (!) 97.4 F (36.3 C) (Axillary)   Resp 12   Ht 5\' 1"  (1.549 m)   Wt 128 lb 8 oz (58.3 kg)   SpO2 95%   BMI 24.28 kg/m   LABS:  Lab Results  Component Value Date   WBC 8.2 08/24/2016   HGB 10.8 (L) 08/24/2016   HCT 34.0 (L) 08/24/2016   MCV 88.1 08/24/2016   PLT 230 08/24/2016   BMET    Component Value Date/Time   NA 135 08/24/2016 0241   NA 141 04/04/2016 0917   NA 138 07/05/2011 1145   K 4.0 08/24/2016 0241   K 3.8 07/05/2011 1145   CL 100 (L) 08/24/2016 0241   CL 109 (H) 07/05/2011 1145   CO2 28 08/24/2016 0241   CO2 25 07/05/2011 1145   GLUCOSE 155 (H) 08/24/2016 0241   GLUCOSE 81 07/05/2011 1145   BUN 11 08/24/2016 0241   BUN 9 04/04/2016 0917   BUN 15 07/05/2011 1145   CREATININE 0.84 08/24/2016 0241   CREATININE 0.85 10/27/2015 1430   CALCIUM 9.0 08/24/2016 0241   CALCIUM 8.4 (L) 07/05/2011 1145   GFRNONAA >60 08/24/2016 0241   GFRNONAA >60 07/05/2011 1145   GFRAA >60 08/24/2016 0241   GFRAA >60 07/05/2011 1145    Lab Results  Component Value Date   INR 1.07 08/24/2016   INR 0.99 08/21/2016   INR 1.10 09/17/2014   No results found for: PTT   [name] is status post extraction of remaining teeth in the OR with general anesthesia on 08/23/16.  SUBJECTIVE: Patient has some discomfort from dental extraction sites. Denies bleeding.  EXAM: No sign of infection, heme, or ooze. Sutures intact. Intraoral and extraoral bruising and swelling noted.   ASSESSMENT: Post operative course is consistent with dental procedures performed in the OR. Patient is edentulous.   PLAN: 1. Use salt water rinses as needed to aid healing. 2. Advance diet as tolerated. 3. F/U with Dentist of choice for fabrication of upper and lower dentures in 4-6 weeks and once medically stable from heart valve surgery. 4. Patient is cleared for  heart valve surgery tomorrow.   [name], DDS

## 2016-08-24 NOTE — Progress Notes (Signed)
[phone] Pt sleepy and stated mouth sore. Does not want to walk. Gave pt [facility] booklet and care guide. Wrote down how to view pre op video and encouraged her to watch later when more awake. Discussed importance of walking and IS after surgery. Did not give IS as pt's mouth very sore at this time. Discussed that she will need someone with her after discharge first week home. Will follow up after surgery. Luetta Nutting RN BSN 08/24/2016 11:35 AM

## 2016-08-24 NOTE — Care Management (Signed)
Case Management Note Initial Note Started By Donn Pierini RN, BSN Unit 4E-Case Manager-- 3W coverage [phone]   Patient Details  Name: Lindsay Camacho MRN: [id number] Date of Birth: Nov 14, 1965  Subjective/Objective:  Pt admitted  With severe MR, HF- cath done 8/20- plan for MVR this week ?friday 8/24              Action/Plan: PTA pt lived at home-lives in [place]-   CM to follow post op for d/c needs  Expected Discharge Date:                         Expected Discharge Plan:     In-House Referral:     Discharge planning Services  CM Consult  Post Acute Care Choice:    Choice offered to:     DME Arranged:    DME Agency:     HH Arranged:    HH Agency:     Status of Service:  In process, will continue to follow  If discussed at Long Length of Stay Meetings, dates discussed:   Discharge Disposition:    Additional Comments: 1607 08-24-16 Tomi Bamberger, RN,BSN [phone] CM did speak with pt in regards to questions about her Disability. Financial Counseling is following the patient. Pt lives in [place] with an aunt. Pt states she will return to [place] post procedure. CM will continue to monitor for home needs.

## 2016-08-24 NOTE — Progress Notes (Signed)
Progress Note  Patient Name: Lindsay Camacho Date of Encounter: 08/24/2016  Primary Cardiologist: Eldridge Dace  Subjective   Anxious, no cp  Inpatient Medications    Scheduled Meds: . aspirin EC  81 mg Oral Daily  . atorvastatin  80 mg Oral Daily  . carvedilol  6.25 mg Oral BID WC  . chlorhexidine  15 mL Mouth/Throat BID  . furosemide  40 mg Oral Daily  . [START ON 08/25/2016] [facility] ([facility]) lidocaine 2% Syringe (13mL)  13 mL Intracoronary Once  . [START ON 08/25/2016] [facility] ([facility]) lidocaine 2% Syringe (13mL)  13 mL Intracoronary Once  . [START ON 08/25/2016] [facility] ([facility]) mannitol 20% Syringe (32mL)  32 mL Intracoronary Once  . [START ON 08/25/2016] [facility] ([facility]) mannitol 20% Syringe (32mL)  32 mL Intracoronary Once  . potassium chloride  10 mEq Oral Daily  . sodium chloride flush  3 mL Intravenous Q12H  . sodium chloride flush  3 mL Intravenous Q12H   Continuous Infusions: . sodium chloride Stopped (08/18/16 2213)  . sodium chloride    . lactated ringers Stopped (08/23/16 1610)  . lactated ringers    . sodium chloride irrigation     PRN Meds: sodium chloride, sodium chloride, acetaminophen, albuterol, diphenhydrAMINE, docusate sodium, hydrocortisone cream, Melatonin, ondansetron (ZOFRAN) IV, oxyCODONE-acetaminophen, sodium chloride flush, sodium chloride flush, traMADol   Vital Signs    Vitals:   08/23/16 1718 08/23/16 1943 08/23/16 2050 08/24/16 0420  BP: 97/61 (!) 94/52 93/63 (!) 102/59  Pulse: 64 73  62  Resp:  16 15 12   Temp:  97.6 F (36.4 C)  (!) 97.4 F (36.3 C)  TempSrc:  Axillary  Axillary  SpO2:  90% 96% 95%  Weight:    128 lb 8 oz (58.3 kg)  Height:    5\' 1"  (1.549 m)    Intake/Output Summary (Last 24 hours) at 08/24/16 1045 Last data filed at 08/24/16 0400  Gross per 24 hour  Intake              350 ml  Output             1500 ml  Net            -1150 ml   Filed  Weights   08/23/16 0500 08/23/16 0823 08/24/16 0420  Weight: 126 lb 3.2 oz (57.2 kg) 126 lb (57.2 kg) 128 lb 8 oz (58.3 kg)      Physical Exam   GEN: No acute distress.  Edentulous Neck: No JVD Cardiac: RRR, 2/6 HSM no rubs, or gallops.  Respiratory: Clear to auscultation bilaterally. GI: Soft, nontender, non-distended  MS: No edema; No deformity. Neuro:  Nonfocal  Psych: Normal affect   Labs    Chemistry Recent Labs Lab 08/19/16 0343  08/21/16 0157 08/22/16 0517 08/24/16 0241  NA 139  < > 138 138 135  K 4.2  < > 3.9 4.0 4.0  CL 104  < > 100* 106 100*  CO2 27  < > 32 27 28  GLUCOSE 93  < > 91 82 155*  BUN 11  < > 15 15 11   CREATININE 0.95  < > 1.03* 0.88 0.84  CALCIUM 8.7*  < > 8.6* 8.7* 9.0  PROT 6.7  --   --   --  7.1  ALBUMIN 3.4*  --   --   --  3.3*  AST 32  --   --   --  30  ALT 21  --   --   --  30  ALKPHOS 72  --   --   --  70  BILITOT 0.6  --   --   --  0.9  GFRNONAA >60  < > >60 >60 >60  GFRAA >60  < > >60 >60 >60  ANIONGAP 8  < > 6 5 7   < > = values in this interval not displayed.   Hematology Recent Labs Lab 08/24/16 0241  WBC 8.2  RBC 3.86*  HGB 10.8*  HCT 34.0*  MCV 88.1  MCH 28.0  MCHC 31.8  RDW 14.6  PLT 230    Cardiac EnzymesNo results for input(s): TROPONINI in the last 168 hours. No results for input(s): TROPIPOC in the last 168 hours.   BNP Recent Labs Lab 08/18/16 0427  BNP 252.1*     DDimer No results for input(s): DDIMER in the last 168 hours.   Radiology    Dg Chest 2 View  Result Date: 08/24/2016 CLINICAL DATA:  CHF.  Preop heart valve replacement. EXAM: CHEST  2 VIEW COMPARISON:  08/17/2016 FINDINGS: Mild cardiomegaly. Lungs are clear. No effusions or edema. No acute bony abnormality. IMPRESSION: Cardiomegaly.  No active disease. Electronically Signed   By: Charlett Nose M.D.   On: 08/24/2016 07:24   Ct Angio Chest/abd/pel For Dissection W And/or W/wo  Result Date: 08/22/2016 CLINICAL DATA:  Preop for heart  surgery. EXAM: CT ANGIOGRAPHY CHEST, ABDOMEN AND PELVIS TECHNIQUE: Multidetector CT imaging through the chest, abdomen and pelvis was performed using the standard protocol during bolus administration of intravenous contrast. Multiplanar reconstructed images and MIPs were obtained and reviewed to evaluate the vascular anatomy. CONTRAST:  100 mL of Isovue 370 intravenously. COMPARISON:  CT scan of September 22, 2014. FINDINGS: CTA CHEST FINDINGS Cardiovascular: Atherosclerosis of thoracic aorta is noted without aneurysm or dissection. Mild eccentric plaque is noted involving the proximal portion of the right innominate artery. Left common carotid artery is widely patent without stenosis. Severe stenosis is noted in the proximal left subclavian artery. Mild cardiomegaly is noted without pericardial effusion. Mediastinum/Nodes: No enlarged mediastinal, hilar, or axillary lymph nodes. Thyroid gland, trachea, and esophagus demonstrate no significant findings. Lungs/Pleura: Lungs are clear. No pleural effusion or pneumothorax. Musculoskeletal: No chest wall abnormality. No acute or significant osseous findings. Review of the MIP images confirms the above findings. CTA ABDOMEN AND PELVIS FINDINGS VASCULAR Aorta: Atherosclerosis of abdominal aorta is noted without aneurysm or dissection. Celiac: Patent without evidence of aneurysm, dissection, vasculitis or significant stenosis. SMA: Shares common origin with celiac artery. No significant stenosis of thrombus is noted. Renals: Both renal arteries are patent without evidence of aneurysm, dissection, vasculitis, fibromuscular dysplasia or significant stenosis. IMA: Patent without evidence of aneurysm, dissection, vasculitis or significant stenosis. Inflow: Extensive atherosclerosis is noted moderate focal stenosis is noted at the origin of the left common iliac artery. Left external iliac artery is widely patent. Long length eccentric plaque is noted in the right common iliac  artery which results in moderate stenosis. Right external iliac artery is widely patent. Veins: No obvious venous abnormality within the limitations of this arterial phase study. Review of the MIP images confirms the above findings. NON-VASCULAR Hepatobiliary: No focal liver abnormality is seen. No gallstones, gallbladder wall thickening, or biliary dilatation. Pancreas: Unremarkable. No pancreatic ductal dilatation or surrounding inflammatory changes. Spleen: Normal in size without focal abnormality. Adrenals/Urinary Tract: Adrenal glands are unremarkable. Kidneys are normal, without renal calculi, focal lesion, or hydronephrosis.  Bladder is unremarkable. Stomach/Bowel: There is no evidence of bowel obstruction or inflammation. The stomach appears normal. The appendix is not identified. Lymphatic: No significant adenopathy is noted. Reproductive: Uterus and bilateral adnexa are unremarkable. Other: Minimal amount of free fluid is noted in the posterior pelvis which most likely is physiologic. Musculoskeletal: No acute or significant osseous findings. Review of the MIP images confirms the above findings. IMPRESSION: Atherosclerosis of thoracic and abdominal aorta is noted without aneurysm or dissection. Severe stenosis is seen involving the proximal left subclavian artery. No significant mesenteric or renal artery stenosis is noted. Long length eccentric plaque is noted in right common iliac artery resulting in moderate stenosis. Moderate focal stenosis is noted at the origin of the left common iliac artery secondary to calcified atheromatous disease. Electronically Signed   By: Lupita Raider, M.D.   On: 08/22/2016 20:26    Cardiac Studies   Prior cath reviewed  Patient Profile     51 y.o. female awaiting MVR  Assessment & Plan    Awaiting sugery  Signed, Donato Schultz, MD  08/24/2016, 10:45 AM

## 2016-08-24 NOTE — Anesthesia Preprocedure Evaluation (Addendum)
Anesthesia Evaluation  Patient identified by MRN, date of birth, ID band Patient awake    Reviewed: Allergy & Precautions, NPO status , Patient's Chart, lab work & pertinent test results  History of Anesthesia Complications Negative for: history of anesthetic complications  Airway Mallampati: II  TM Distance: <3 FB Neck ROM: Full  Mouth opening: Limited Mouth Opening  Dental no notable dental hx. (+) Edentulous Upper, Edentulous Lower   Pulmonary former smoker,    breath sounds clear to auscultation       Cardiovascular hypertension, + CAD, + Past MI, + Peripheral Vascular Disease and +CHF  + dysrhythmias + Valvular Problems/Murmurs MR  Rhythm:Regular Rate:Normal + Systolic murmurs Study Conclusions  - Left ventricle: Systolic function was moderately reduced. The   estimated ejection fraction was in the range of 35% to 40%.   Diffuse hypokinesis. - Aortic valve: No evidence of vegetation. There was mild   regurgitation. - Mitral valve: Mobility of the posterior leaflet was restricted.   No evidence of vegetation. There was severe regurgitation. - Left atrium: The atrium was moderately dilated. No evidence of   thrombus in the atrial cavity or appendage.  1. Patent stent in the Circumflex artery with minimal restenosis 2. Mild non-obstructive disease in the RCA and LAD (of note, there was spasm in the RCA with catheter engagement that resolved with SL NTG)  Recommendations: Medical management of CAD. Continue planning for valve repair/replacement.    Neuro/Psych CVA negative psych ROS   GI/Hepatic negative GI ROS, Neg liver ROS,   Endo/Other  negative endocrine ROS  Renal/GU negative Renal ROS     Musculoskeletal   Abdominal   Peds  Hematology  (+) anemia ,   Anesthesia Other Findings   Reproductive/Obstetrics                            Anesthesia Physical  Anesthesia Plan  ASA:  IV  Anesthesia Plan: General   Post-op Pain Management:    Induction: Intravenous  PONV Risk Score and Plan: 3 and Ondansetron, Dexamethasone and Treatment may vary due to age or medical condition  Airway Management Planned: Oral ETT  Additional Equipment: Arterial line, PA Cath, 3D TEE and Ultrasound Guidance Line Placement  Intra-op Plan:   Post-operative Plan: Post-operative intubation/ventilation  Informed Consent: I have reviewed the patients History and Physical, chart, labs and discussed the procedure including the risks, benefits and alternatives for the proposed anesthesia with the patient or authorized representative who has indicated his/her understanding and acceptance.   Dental advisory given  Plan Discussed with: CRNA and Anesthesiologist  Anesthesia Plan Comments:        Anesthesia Quick Evaluation

## 2016-08-24 NOTE — Progress Notes (Signed)
Nutrition Brief Note  RD consulted for assessment of status s/p teeth extraction 08/23/16.  Wt Readings from Last 15 Encounters:  08/24/16 128 lb 8 oz (58.3 kg)  04/04/16 127 lb (57.6 kg)  11/19/15 121 lb 12.8 oz (55.2 kg)  11/10/15 121 lb (54.9 kg)  10/27/15 121 lb (54.9 kg)  02/08/15 123 lb (55.8 kg)  10/05/14 115 lb (52.2 kg)  09/24/14 119 lb (54 kg)  09/22/14 119 lb 9.6 oz (54.3 kg)  03/11/14 109 lb 9.6 oz (49.7 kg)  03/06/14 113 lb (51.3 kg)  02/12/14 113 lb (51.3 kg)  01/12/14 113 lb (51.3 kg)  01/09/14 111 lb 4.6 oz (50.5 kg)  05/27/13 130 lb (59 kg)   Body mass index is 24.28 kg/m. Patient meets criteria for normal based on current BMI.   Current diet order is soft, per pt report, patient is consuming approximately 80% of meals at this time.   Pt reports no recent weight loss and a UBW of 128 lb, which is pt's current weight.  Pt reports no decrease in appetite and reports consuming 3 meals per day PTA.  Labs and medications reviewed.   Discussed soft food options available to pt. Pt not willing to consume nutritional supplement at this time.   No nutrition interventions warranted at this time. If nutrition issues arise, please consult RD.   Fransisca Kaufmann, RDN 08/24/2016 1:06 PM

## 2016-08-25 ENCOUNTER — Encounter (HOSPITAL_COMMUNITY): Payer: Self-pay | Admitting: Thoracic Surgery (Cardiothoracic Vascular Surgery)

## 2016-08-25 ENCOUNTER — Inpatient Hospital Stay (HOSPITAL_COMMUNITY): Payer: Self-pay

## 2016-08-25 ENCOUNTER — Inpatient Hospital Stay (HOSPITAL_COMMUNITY): Payer: Self-pay | Admitting: Certified Registered Nurse Anesthetist

## 2016-08-25 ENCOUNTER — Encounter (HOSPITAL_COMMUNITY)
Admission: AD | Disposition: A | Discharge status: Home / Self Care | Payer: Self-pay | Source: Other Acute Inpatient Hospital | Attending: Cardiology

## 2016-08-25 DIAGNOSIS — Z9889 Other specified postprocedural states: Secondary | ICD-10-CM

## 2016-08-25 HISTORY — DX: Other specified postprocedural states: Z98.890

## 2016-08-25 HISTORY — PX: TEE WITHOUT CARDIOVERSION: SHX5443

## 2016-08-25 HISTORY — PX: MITRAL VALVE REPAIR: SHX2039

## 2016-08-25 LAB — POCT I-STAT 3, ART BLOOD GAS (G3+)
ACID-BASE DEFICIT: 1 mmol/L (ref 0.0–2.0)
ACID-BASE DEFICIT: 1 mmol/L (ref 0.0–2.0)
ACID-BASE DEFICIT: 1 mmol/L (ref 0.0–2.0)
ACID-BASE EXCESS: 4 mmol/L — AB (ref 0.0–2.0)
BICARBONATE: 27.1 mmol/L (ref 20.0–28.0)
BICARBONATE: 24.5 mmol/L (ref 20.0–28.0)
BICARBONATE: 25.8 mmol/L (ref 20.0–28.0)
Bicarbonate: 26.1 mmol/L (ref 20.0–28.0)
Bicarbonate: 24.4 mmol/L (ref 20.0–28.0)
O2 SAT: 100 %
O2 SAT: 92 %
O2 SAT: 95 %
O2 SAT: 96 %
O2 Saturation: 98 %
PCO2 ART: 44.9 mmHg (ref 32.0–48.0)
PCO2 ART: 45.8 mmHg (ref 32.0–48.0)
PO2 ART: 404 mmHg — AB (ref 83.0–108.0)
PO2 ART: 68 mmHg — AB (ref 83.0–108.0)
PO2 ART: 84 mmHg (ref 83.0–108.0)
Patient temperature: 37.5
TCO2: 28 mmol/L (ref 22–32)
TCO2: 26 mmol/L (ref 22–32)
TCO2: 27 mmol/L (ref 22–32)
TCO2: 28 mmol/L (ref 22–32)
TCO2: 26 mmol/L (ref 22–32)
pCO2 arterial: 35.6 mmHg (ref 32.0–48.0)
pCO2 arterial: 51.2 mmHg — ABNORMAL HIGH (ref 32.0–48.0)
pCO2 arterial: 51.3 mmHg — ABNORMAL HIGH (ref 32.0–48.0)
pH, Arterial: 7.489 — ABNORMAL HIGH (ref 7.350–7.450)
pH, Arterial: 7.345 — ABNORMAL LOW (ref 7.350–7.450)
pH, Arterial: 7.303 — ABNORMAL LOW (ref 7.350–7.450)
pH, Arterial: 7.314 — ABNORMAL LOW (ref 7.350–7.450)
pH, Arterial: 7.337 — ABNORMAL LOW (ref 7.350–7.450)
pO2, Arterial: 115 mmHg — ABNORMAL HIGH (ref 83.0–108.0)
pO2, Arterial: 87 mmHg (ref 83.0–108.0)

## 2016-08-25 LAB — POCT I-STAT, CHEM 8
BUN: 12 mg/dL (ref 6–20)
BUN: 12 mg/dL (ref 6–20)
BUN: 10 mg/dL (ref 6–20)
BUN: 12 mg/dL (ref 6–20)
BUN: 13 mg/dL (ref 6–20)
BUN: 12 mg/dL (ref 6–20)
BUN: 10 mg/dL (ref 6–20)
CALCIUM ION: 1.21 mmol/L (ref 1.15–1.40)
CALCIUM ION: 0.91 mmol/L — AB (ref 1.15–1.40)
CALCIUM ION: 0.98 mmol/L — AB (ref 1.15–1.40)
CALCIUM ION: 0.96 mmol/L — AB (ref 1.15–1.40)
CHLORIDE: 102 mmol/L (ref 101–111)
CHLORIDE: 103 mmol/L (ref 101–111)
CHLORIDE: 103 mmol/L (ref 101–111)
CREATININE: 0.8 mg/dL (ref 0.44–1.00)
CREATININE: 0.8 mg/dL (ref 0.44–1.00)
CREATININE: 0.6 mg/dL (ref 0.44–1.00)
CREATININE: 0.6 mg/dL (ref 0.44–1.00)
CREATININE: 0.7 mg/dL (ref 0.44–1.00)
CREATININE: 0.7 mg/dL (ref 0.44–1.00)
CREATININE: 0.7 mg/dL (ref 0.44–1.00)
Calcium, Ion: 1.18 mmol/L (ref 1.15–1.40)
Calcium, Ion: 0.97 mmol/L — ABNORMAL LOW (ref 1.15–1.40)
Calcium, Ion: 0.99 mmol/L — ABNORMAL LOW (ref 1.15–1.40)
Chloride: 104 mmol/L (ref 101–111)
Chloride: 102 mmol/L (ref 101–111)
Chloride: 103 mmol/L (ref 101–111)
Chloride: 103 mmol/L (ref 101–111)
GLUCOSE: 90 mg/dL (ref 65–99)
GLUCOSE: 135 mg/dL — AB (ref 65–99)
GLUCOSE: 129 mg/dL — AB (ref 65–99)
GLUCOSE: 117 mg/dL — AB (ref 65–99)
Glucose, Bld: 87 mg/dL (ref 65–99)
Glucose, Bld: 119 mg/dL — ABNORMAL HIGH (ref 65–99)
Glucose, Bld: 135 mg/dL — ABNORMAL HIGH (ref 65–99)
HCT: 26 % — ABNORMAL LOW (ref 36.0–46.0)
HCT: 26 % — ABNORMAL LOW (ref 36.0–46.0)
HCT: 29 % — ABNORMAL LOW (ref 36.0–46.0)
HCT: 29 % — ABNORMAL LOW (ref 36.0–46.0)
HEMATOCRIT: 24 % — AB (ref 36.0–46.0)
HEMATOCRIT: 25 % — AB (ref 36.0–46.0)
HEMATOCRIT: 24 % — AB (ref 36.0–46.0)
HEMOGLOBIN: 8.8 g/dL — AB (ref 12.0–15.0)
HEMOGLOBIN: 9.9 g/dL — AB (ref 12.0–15.0)
Hemoglobin: 8.8 g/dL — ABNORMAL LOW (ref 12.0–15.0)
Hemoglobin: 8.2 g/dL — ABNORMAL LOW (ref 12.0–15.0)
Hemoglobin: 8.5 g/dL — ABNORMAL LOW (ref 12.0–15.0)
Hemoglobin: 9.9 g/dL — ABNORMAL LOW (ref 12.0–15.0)
Hemoglobin: 8.2 g/dL — ABNORMAL LOW (ref 12.0–15.0)
Potassium: 3.8 mmol/L (ref 3.5–5.1)
Potassium: 3.7 mmol/L (ref 3.5–5.1)
Potassium: 4.4 mmol/L (ref 3.5–5.1)
Potassium: 4.7 mmol/L (ref 3.5–5.1)
Potassium: 5.7 mmol/L — ABNORMAL HIGH (ref 3.5–5.1)
Potassium: 3.4 mmol/L — ABNORMAL LOW (ref 3.5–5.1)
Potassium: 4.2 mmol/L (ref 3.5–5.1)
SODIUM: 142 mmol/L (ref 135–145)
SODIUM: 142 mmol/L (ref 135–145)
SODIUM: 137 mmol/L (ref 135–145)
Sodium: 142 mmol/L (ref 135–145)
Sodium: 140 mmol/L (ref 135–145)
Sodium: 143 mmol/L (ref 135–145)
Sodium: 146 mmol/L — ABNORMAL HIGH (ref 135–145)
TCO2: 31 mmol/L (ref 22–32)
TCO2: 29 mmol/L (ref 22–32)
TCO2: 25 mmol/L (ref 22–32)
TCO2: 26 mmol/L (ref 22–32)
TCO2: 26 mmol/L (ref 22–32)
TCO2: 24 mmol/L (ref 22–32)
TCO2: 29 mmol/L (ref 22–32)

## 2016-08-25 LAB — CBC
HCT: 36.9 % (ref 36.0–46.0)
HEMATOCRIT: 33.2 % — AB (ref 36.0–46.0)
HEMATOCRIT: 26.1 % — AB (ref 36.0–46.0)
HEMOGLOBIN: 11.1 g/dL — AB (ref 12.0–15.0)
HEMOGLOBIN: 8.5 g/dL — AB (ref 12.0–15.0)
Hemoglobin: 10.7 g/dL — ABNORMAL LOW (ref 12.0–15.0)
MCH: 26.6 pg (ref 26.0–34.0)
MCH: 27.7 pg (ref 26.0–34.0)
MCH: 27.6 pg (ref 26.0–34.0)
MCHC: 30.1 g/dL (ref 30.0–36.0)
MCHC: 32.2 g/dL (ref 30.0–36.0)
MCHC: 32.6 g/dL (ref 30.0–36.0)
MCV: 88.5 fL (ref 78.0–100.0)
MCV: 86 fL (ref 78.0–100.0)
MCV: 84.7 fL (ref 78.0–100.0)
PLATELETS: 116 10*3/uL — AB (ref 150–400)
Platelets: 267 10*3/uL (ref 150–400)
Platelets: 83 10*3/uL — ABNORMAL LOW (ref 150–400)
RBC: 4.17 MIL/uL (ref 3.87–5.11)
RBC: 3.86 MIL/uL — ABNORMAL LOW (ref 3.87–5.11)
RBC: 3.08 MIL/uL — AB (ref 3.87–5.11)
RDW: 14.5 % (ref 11.5–15.5)
RDW: 14.8 % (ref 11.5–15.5)
RDW: 14.7 % (ref 11.5–15.5)
WBC: 8.7 10*3/uL (ref 4.0–10.5)
WBC: 14.4 10*3/uL — AB (ref 4.0–10.5)
WBC: 6.8 10*3/uL (ref 4.0–10.5)

## 2016-08-25 LAB — APTT: APTT: 45 s — AB (ref 24–36)

## 2016-08-25 LAB — PREPARE RBC (CROSSMATCH)

## 2016-08-25 LAB — GLUCOSE, CAPILLARY
GLUCOSE-CAPILLARY: 112 mg/dL — AB (ref 65–99)
Glucose-Capillary: 102 mg/dL — ABNORMAL HIGH (ref 65–99)
Glucose-Capillary: 107 mg/dL — ABNORMAL HIGH (ref 65–99)
Glucose-Capillary: 117 mg/dL — ABNORMAL HIGH (ref 65–99)

## 2016-08-25 LAB — POCT I-STAT 4, (NA,K, GLUC, HGB,HCT)
Glucose, Bld: 105 mg/dL — ABNORMAL HIGH (ref 65–99)
HCT: 28 % — ABNORMAL LOW (ref 36.0–46.0)
HEMOGLOBIN: 9.5 g/dL — AB (ref 12.0–15.0)
POTASSIUM: 3.7 mmol/L (ref 3.5–5.1)
SODIUM: 144 mmol/L (ref 135–145)

## 2016-08-25 LAB — BASIC METABOLIC PANEL
Anion gap: 9 (ref 5–15)
BUN: 12 mg/dL (ref 6–20)
CALCIUM: 9.2 mg/dL (ref 8.9–10.3)
CHLORIDE: 101 mmol/L (ref 101–111)
CO2: 31 mmol/L (ref 22–32)
CREATININE: 0.99 mg/dL (ref 0.44–1.00)
Glucose, Bld: 84 mg/dL (ref 65–99)
Potassium: 4.1 mmol/L (ref 3.5–5.1)
SODIUM: 141 mmol/L (ref 135–145)

## 2016-08-25 LAB — CREATININE, SERUM
Creatinine, Ser: 0.81 mg/dL (ref 0.44–1.00)
GFR calc Af Amer: >60 mL/min (ref >60)
GFR calc non Af Amer: >60 mL/min (ref >60)

## 2016-08-25 LAB — PLATELET COUNT: PLATELETS: 123 10*3/uL — AB (ref 150–400)

## 2016-08-25 LAB — HEMOGLOBIN AND HEMATOCRIT, BLOOD
HCT: 28.3 % — ABNORMAL LOW (ref 36.0–46.0)
Hemoglobin: 9.3 g/dL — ABNORMAL LOW (ref 12.0–15.0)

## 2016-08-25 LAB — PROTIME-INR
INR: 1.96
Prothrombin Time: 22.6 seconds — ABNORMAL HIGH (ref 11.4–15.2)

## 2016-08-25 LAB — MAGNESIUM: Magnesium: 3.1 mg/dL — ABNORMAL HIGH (ref 1.7–2.4)

## 2016-08-25 SURGERY — REPAIR, MITRAL VALVE, MINIMALLY INVASIVE
Anesthesia: General | Site: Chest | Laterality: Right

## 2016-08-25 MED ORDER — ACETAMINOPHEN 650 MG RE SUPP
650.0000 mg | Freq: Once | RECTAL | Status: AC
  Administered 2016-08-25: 650 mg via RECTAL

## 2016-08-25 MED ORDER — VANCOMYCIN HCL IN DEXTROSE 1-5 GM/200ML-% IV SOLN
1000.0000 mg | Freq: Once | INTRAVENOUS | Status: AC
  Administered 2016-08-25: 1000 mg via INTRAVENOUS
  Filled 2016-08-25: qty 200

## 2016-08-25 MED ORDER — SODIUM CHLORIDE 0.45 % IV SOLN: INTRAVENOUS | Status: DC | PRN

## 2016-08-25 MED ORDER — BUPIVACAINE HCL (PF) 0.5 % IJ SOLN
INTRAMUSCULAR | Status: DC | PRN
  Administered 2016-08-25: 10 mL

## 2016-08-25 MED ORDER — ACETAMINOPHEN 160 MG/5ML PO SOLN: 650.0000 mg | Freq: Once | ORAL | Status: AC

## 2016-08-25 MED ORDER — CHLORHEXIDINE GLUCONATE 0.12% ORAL RINSE (MEDLINE KIT)
15.0000 mL | Freq: Two times a day (BID) | OROMUCOSAL | Status: DC
  Administered 2016-08-25: 15 mL via OROMUCOSAL

## 2016-08-25 MED ORDER — OXYCODONE HCL 5 MG PO TABS
5.0000 mg | ORAL_TABLET | ORAL | Status: DC | PRN
  Administered 2016-08-26 – 2016-08-31 (×9): 10 mg via ORAL
  Filled 2016-08-25 (×10): qty 2

## 2016-08-25 MED ORDER — LACTATED RINGERS IV SOLN: INTRAVENOUS | Status: DC

## 2016-08-25 MED ORDER — MAGNESIUM SULFATE 4 GM/100ML IV SOLN
4.0000 g | Freq: Once | INTRAVENOUS | Status: AC
  Administered 2016-08-25: 4 g via INTRAVENOUS
  Filled 2016-08-25: qty 100

## 2016-08-25 MED ORDER — PANTOPRAZOLE SODIUM 40 MG PO TBEC
40.0000 mg | DELAYED_RELEASE_TABLET | Freq: Every day | ORAL | Status: DC
  Administered 2016-08-27 – 2016-08-31 (×5): 40 mg via ORAL
  Filled 2016-08-25 (×5): qty 1

## 2016-08-25 MED ORDER — SODIUM CHLORIDE 0.9 % IV SOLN
Freq: Once | INTRAVENOUS | Status: AC
  Administered 2016-08-25: 10 mL/h via INTRAVENOUS

## 2016-08-25 MED ORDER — LIDOCAINE 2% (20 MG/ML) 5 ML SYRINGE
INTRAMUSCULAR | Status: DC | PRN
  Administered 2016-08-25: 100 mg via INTRAVENOUS

## 2016-08-25 MED ORDER — LACTATED RINGERS IV SOLN
INTRAVENOUS | Status: DC
  Administered 2016-08-26: 15:00:00 via INTRAVENOUS

## 2016-08-25 MED ORDER — FENTANYL CITRATE (PF) 250 MCG/5ML IJ SOLN
INTRAMUSCULAR | Status: DC | PRN
  Administered 2016-08-25: 150 ug via INTRAVENOUS
  Administered 2016-08-25: 50 ug via INTRAVENOUS
  Administered 2016-08-25: 100 ug via INTRAVENOUS
  Administered 2016-08-25: 150 ug via INTRAVENOUS
  Administered 2016-08-25 (×2): 100 ug via INTRAVENOUS
  Administered 2016-08-25 (×2): 150 ug via INTRAVENOUS
  Administered 2016-08-25: 100 ug via INTRAVENOUS
  Administered 2016-08-25: 50 ug via INTRAVENOUS
  Administered 2016-08-25: 150 ug via INTRAVENOUS

## 2016-08-25 MED ORDER — MILRINONE LACTATE IN DEXTROSE 20-5 MG/100ML-% IV SOLN
0.1250 ug/kg/min | INTRAVENOUS | Status: AC
  Administered 2016-08-25: .3 ug/kg/min via INTRAVENOUS
  Filled 2016-08-25: qty 100

## 2016-08-25 MED ORDER — ASPIRIN EC 325 MG PO TBEC
325.0000 mg | DELAYED_RELEASE_TABLET | Freq: Every day | ORAL | Status: DC
  Administered 2016-08-26 – 2016-08-27 (×2): 325 mg via ORAL
  Filled 2016-08-25 (×2): qty 1

## 2016-08-25 MED ORDER — LACTATED RINGERS IV SOLN
INTRAVENOUS | Status: DC | PRN
  Administered 2016-08-25: 07:00:00 via INTRAVENOUS

## 2016-08-25 MED ORDER — METOPROLOL TARTRATE 12.5 MG HALF TABLET: 12.5000 mg | ORAL_TABLET | Freq: Two times a day (BID) | ORAL | Status: DC

## 2016-08-25 MED ORDER — FAMOTIDINE IN NACL 20-0.9 MG/50ML-% IV SOLN
20.0000 mg | Freq: Two times a day (BID) | INTRAVENOUS | Status: AC
  Administered 2016-08-25 – 2016-08-26 (×2): 20 mg via INTRAVENOUS
  Filled 2016-08-25 (×2): qty 50

## 2016-08-25 MED ORDER — BISACODYL 10 MG RE SUPP
10.0000 mg | Freq: Every day | RECTAL | Status: DC
  Filled 2016-08-25: qty 1

## 2016-08-25 MED ORDER — PROPOFOL 10 MG/ML IV BOLUS
INTRAVENOUS | Status: AC
  Filled 2016-08-25: qty 20

## 2016-08-25 MED ORDER — 0.9 % SODIUM CHLORIDE (POUR BTL) OPTIME
TOPICAL | Status: DC | PRN
  Administered 2016-08-25: 6000 mL

## 2016-08-25 MED ORDER — LEVOFLOXACIN IN D5W 750 MG/150ML IV SOLN
750.0000 mg | INTRAVENOUS | Status: AC
  Administered 2016-08-26: 750 mg via INTRAVENOUS
  Filled 2016-08-25: qty 150

## 2016-08-25 MED ORDER — LACTATED RINGERS IV SOLN: 500.0000 mL | Freq: Once | INTRAVENOUS | Status: DC | PRN

## 2016-08-25 MED ORDER — SODIUM CHLORIDE 0.9 % IV SOLN
INTRAVENOUS | Status: DC
  Filled 2016-08-25: qty 1

## 2016-08-25 MED ORDER — BISACODYL 5 MG PO TBEC
10.0000 mg | DELAYED_RELEASE_TABLET | Freq: Every day | ORAL | Status: DC
  Administered 2016-08-26 – 2016-08-28 (×3): 10 mg via ORAL
  Filled 2016-08-25 (×6): qty 2

## 2016-08-25 MED ORDER — BUPIVACAINE 0.5 % ON-Q PUMP SINGLE CATH 400 ML
400.0000 mL | INJECTION | Status: AC
  Administered 2016-08-25: 400 mL
  Filled 2016-08-25: qty 400

## 2016-08-25 MED ORDER — BUPIVACAINE HCL (PF) 0.5 % IJ SOLN
INTRAMUSCULAR | Status: AC
  Filled 2016-08-25: qty 10

## 2016-08-25 MED ORDER — ACETAMINOPHEN 500 MG PO TABS
1000.0000 mg | ORAL_TABLET | Freq: Four times a day (QID) | ORAL | Status: AC
  Administered 2016-08-26 – 2016-08-30 (×14): 1000 mg via ORAL
  Filled 2016-08-25 (×14): qty 2

## 2016-08-25 MED ORDER — MORPHINE SULFATE (PF) 4 MG/ML IV SOLN
1.0000 mg | INTRAVENOUS | Status: DC | PRN
  Administered 2016-08-25: 2 mg via INTRAVENOUS

## 2016-08-25 MED ORDER — ORAL CARE MOUTH RINSE
15.0000 mL | Freq: Four times a day (QID) | OROMUCOSAL | Status: DC
  Administered 2016-08-26 (×2): 15 mL via OROMUCOSAL

## 2016-08-25 MED ORDER — GLUTARALDEHYDE 0.625% SOAKING SOLUTION
TOPICAL | Status: AC
  Administered 2016-08-25: 1 via TOPICAL
  Filled 2016-08-25: qty 50

## 2016-08-25 MED ORDER — SODIUM CHLORIDE 0.9 % IV SOLN: INTRAVENOUS | Status: DC

## 2016-08-25 MED ORDER — SODIUM CHLORIDE 0.9% FLUSH
3.0000 mL | Freq: Two times a day (BID) | INTRAVENOUS | Status: DC
  Administered 2016-08-26 – 2016-08-30 (×5): 3 mL via INTRAVENOUS

## 2016-08-25 MED ORDER — ROCURONIUM BROMIDE 10 MG/ML (PF) SYRINGE
PREFILLED_SYRINGE | INTRAVENOUS | Status: DC | PRN
  Administered 2016-08-25 (×2): 50 mg via INTRAVENOUS
  Administered 2016-08-25: 40 mg via INTRAVENOUS
  Administered 2016-08-25: 60 mg via INTRAVENOUS

## 2016-08-25 MED ORDER — AMIODARONE HCL IN DEXTROSE 360-4.14 MG/200ML-% IV SOLN
60.0000 mg/h | INTRAVENOUS | Status: AC
  Administered 2016-08-25: 60 mg/h via INTRAVENOUS
  Filled 2016-08-25: qty 200

## 2016-08-25 MED ORDER — POTASSIUM CHLORIDE 10 MEQ/50ML IV SOLN
10.0000 meq | INTRAVENOUS | Status: AC
  Administered 2016-08-25 (×3): 10 meq via INTRAVENOUS

## 2016-08-25 MED ORDER — ASPIRIN 81 MG PO CHEW: 324.0000 mg | CHEWABLE_TABLET | Freq: Every day | ORAL | Status: DC

## 2016-08-25 MED ORDER — MIDAZOLAM HCL 5 MG/5ML IJ SOLN
INTRAMUSCULAR | Status: DC | PRN
  Administered 2016-08-25: 1 mg via INTRAVENOUS
  Administered 2016-08-25: 2 mg via INTRAVENOUS
  Administered 2016-08-25: 1 mg via INTRAVENOUS
  Administered 2016-08-25: 2 mg via INTRAVENOUS
  Administered 2016-08-25: 1 mg via INTRAVENOUS

## 2016-08-25 MED ORDER — SODIUM CHLORIDE 0.9 % IV SOLN
0.0000 ug/min | INTRAVENOUS | Status: DC
  Administered 2016-08-25: 25 ug/min via INTRAVENOUS
  Administered 2016-08-26: 30 ug/min via INTRAVENOUS
  Filled 2016-08-25 (×2): qty 2

## 2016-08-25 MED ORDER — ALBUMIN HUMAN 5 % IV SOLN
INTRAVENOUS | Status: DC | PRN
  Administered 2016-08-25 (×2): via INTRAVENOUS

## 2016-08-25 MED ORDER — AMIODARONE HCL IN DEXTROSE 360-4.14 MG/200ML-% IV SOLN
30.0000 mg/h | INTRAVENOUS | Status: DC
  Administered 2016-08-25 – 2016-08-27 (×4): 30 mg/h via INTRAVENOUS
  Filled 2016-08-25 (×3): qty 200

## 2016-08-25 MED ORDER — MORPHINE SULFATE (PF) 4 MG/ML IV SOLN
1.0000 mg | INTRAVENOUS | Status: DC | PRN
  Administered 2016-08-26 (×5): 2 mg via INTRAVENOUS
  Administered 2016-08-26: 1 mg via INTRAVENOUS
  Administered 2016-08-26 – 2016-08-27 (×5): 2 mg via INTRAVENOUS
  Filled 2016-08-25 (×12): qty 1

## 2016-08-25 MED ORDER — LACTATED RINGERS IV SOLN
INTRAVENOUS | Status: DC | PRN
  Administered 2016-08-25 (×2): via INTRAVENOUS

## 2016-08-25 MED ORDER — HEPARIN SODIUM (PORCINE) 1000 UNIT/ML IJ SOLN
INTRAMUSCULAR | Status: AC
  Filled 2016-08-25: qty 1

## 2016-08-25 MED ORDER — MIDAZOLAM HCL 2 MG/2ML IJ SOLN: 2.0000 mg | INTRAMUSCULAR | Status: DC | PRN

## 2016-08-25 MED ORDER — TRAMADOL HCL 50 MG PO TABS
50.0000 mg | ORAL_TABLET | ORAL | Status: DC | PRN
  Administered 2016-08-26 – 2016-08-30 (×6): 100 mg via ORAL
  Filled 2016-08-25 (×6): qty 2

## 2016-08-25 MED ORDER — MIDAZOLAM HCL 10 MG/2ML IJ SOLN
INTRAMUSCULAR | Status: AC
  Filled 2016-08-25: qty 2

## 2016-08-25 MED ORDER — SODIUM CHLORIDE 0.9 % IV SOLN
0.0000 ug/kg/h | INTRAVENOUS | Status: DC
  Administered 2016-08-25: 0.7 ug/kg/h via INTRAVENOUS
  Filled 2016-08-25 (×2): qty 2

## 2016-08-25 MED ORDER — SODIUM CHLORIDE 0.9 % IV SOLN: 250.0000 mL | INTRAVENOUS | Status: DC

## 2016-08-25 MED ORDER — SODIUM CHLORIDE 0.9% FLUSH: 3.0000 mL | INTRAVENOUS | Status: DC | PRN

## 2016-08-25 MED ORDER — ONDANSETRON HCL 4 MG/2ML IJ SOLN
4.0000 mg | Freq: Four times a day (QID) | INTRAMUSCULAR | Status: DC | PRN
  Administered 2016-08-26 – 2016-08-28 (×4): 4 mg via INTRAVENOUS
  Filled 2016-08-25 (×6): qty 2

## 2016-08-25 MED ORDER — INSULIN ASPART 100 UNIT/ML ~~LOC~~ SOLN
0.0000 [IU] | SUBCUTANEOUS | Status: DC
  Administered 2016-08-26: 2 [IU] via SUBCUTANEOUS

## 2016-08-25 MED ORDER — METOPROLOL TARTRATE 5 MG/5ML IV SOLN: 2.5000 mg | INTRAVENOUS | Status: DC | PRN

## 2016-08-25 MED ORDER — ACETAMINOPHEN 160 MG/5ML PO SOLN
1000.0000 mg | Freq: Four times a day (QID) | ORAL | Status: DC
  Administered 2016-08-26 (×2): 1000 mg
  Filled 2016-08-25 (×2): qty 40.6

## 2016-08-25 MED ORDER — PROTAMINE SULFATE 10 MG/ML IV SOLN
INTRAVENOUS | Status: AC
  Filled 2016-08-25: qty 5

## 2016-08-25 MED ORDER — FENTANYL CITRATE (PF) 250 MCG/5ML IJ SOLN
INTRAMUSCULAR | Status: AC
  Filled 2016-08-25: qty 25

## 2016-08-25 MED ORDER — PROTAMINE SULFATE 10 MG/ML IV SOLN
INTRAVENOUS | Status: AC
  Filled 2016-08-25: qty 25

## 2016-08-25 MED ORDER — METOPROLOL TARTRATE 25 MG/10 ML ORAL SUSPENSION: 12.5000 mg | Freq: Two times a day (BID) | ORAL | Status: DC

## 2016-08-25 MED ORDER — PROPOFOL 10 MG/ML IV BOLUS
INTRAVENOUS | Status: DC | PRN
  Administered 2016-08-25: 80 mg via INTRAVENOUS

## 2016-08-25 MED ORDER — CHLORHEXIDINE GLUCONATE 0.12 % MT SOLN
15.0000 mL | OROMUCOSAL | Status: AC
  Administered 2016-08-25: 15 mL via OROMUCOSAL

## 2016-08-25 MED ORDER — NITROGLYCERIN IN D5W 200-5 MCG/ML-% IV SOLN: 0.0000 ug/min | INTRAVENOUS | Status: DC

## 2016-08-25 MED ORDER — HEPARIN SODIUM (PORCINE) 1000 UNIT/ML IJ SOLN
INTRAMUSCULAR | Status: DC | PRN
  Administered 2016-08-25: 25000 [IU] via INTRAVENOUS

## 2016-08-25 MED ORDER — DOCUSATE SODIUM 100 MG PO CAPS
200.0000 mg | ORAL_CAPSULE | Freq: Every day | ORAL | Status: DC
  Administered 2016-08-26 – 2016-08-28 (×3): 200 mg via ORAL
  Filled 2016-08-25 (×5): qty 2

## 2016-08-25 MED ORDER — INSULIN REGULAR BOLUS VIA INFUSION
0.0000 [IU] | Freq: Three times a day (TID) | INTRAVENOUS | Status: DC
  Filled 2016-08-25: qty 10

## 2016-08-25 MED ORDER — ALBUMIN HUMAN 5 % IV SOLN
250.0000 mL | INTRAVENOUS | Status: AC | PRN
  Administered 2016-08-25 (×2): 250 mL via INTRAVENOUS

## 2016-08-25 MED ORDER — MILRINONE LACTATE IN DEXTROSE 20-5 MG/100ML-% IV SOLN
0.0000 ug/kg/min | INTRAVENOUS | Status: DC
  Administered 2016-08-26 (×2): 0.3 ug/kg/min via INTRAVENOUS
  Filled 2016-08-25 (×2): qty 100

## 2016-08-25 MED ORDER — PROTAMINE SULFATE 10 MG/ML IV SOLN
INTRAVENOUS | Status: DC | PRN
  Administered 2016-08-25: 10 mg via INTRAVENOUS
  Administered 2016-08-25: 250 mg via INTRAVENOUS

## 2016-08-25 MED FILL — Heparin Sodium (Porcine) Inj 1000 Unit/ML: INTRAMUSCULAR | Qty: 30 | Status: AC

## 2016-08-25 MED FILL — Potassium Chloride Inj 2 mEq/ML: INTRAVENOUS | Qty: 40 | Status: AC

## 2016-08-25 MED FILL — Magnesium Sulfate Inj 50%: INTRAMUSCULAR | Qty: 10 | Status: AC

## 2016-08-25 SURGICAL SUPPLY — 123 items
ADAPTER CARDIO PERF ANTE/RETRO (ADAPTER) ×3 IMPLANT
ADH SKN CLS APL DERMABOND .7 (GAUZE/BANDAGES/DRESSINGS) ×4
ADH SKN CLS LQ APL DERMABOND (GAUZE/BANDAGES/DRESSINGS) ×2
ADPR PRFSN 84XANTGRD RTRGD (ADAPTER) ×2
BAG DECANTER FOR FLEXI CONT (MISCELLANEOUS) ×6 IMPLANT
BLADE SURG 11 STRL SS (BLADE) ×3 IMPLANT
CABLE ADAPT CONN TEMP 6FT (ADAPTER) ×1 IMPLANT
CABLE PACING FASLOC BIEGE (MISCELLANEOUS) ×2 IMPLANT
CANISTER SUCT 3000ML PPV (MISCELLANEOUS) ×6 IMPLANT
CANNULA FEM VENOUS REMOTE 22FR (CANNULA) IMPLANT
CANNULA FEMORAL ART 14 SM (MISCELLANEOUS) ×3 IMPLANT
CANNULA GRAFT 8MMX50CM (Graft) ×1 IMPLANT
CANNULA GUNDRY RCSP 15FR (MISCELLANEOUS) ×3 IMPLANT
CANNULA OPTISITE PERFUSION 16F (CANNULA) IMPLANT
CANNULA OPTISITE PERFUSION 18F (CANNULA) IMPLANT
CANNULA SUMP PERICARDIAL (CANNULA) ×6 IMPLANT
CATH KIT ON Q 5IN SLV (PAIN MANAGEMENT) IMPLANT
CATH KIT ON-Q SILVERSOAK 5 (CATHETERS) IMPLANT
CATH KIT ON-Q SILVERSOAK 5IN (CATHETERS) ×3 IMPLANT
CONN ST 1/4X3/8  BEN (MISCELLANEOUS) ×2
CONN ST 1/4X3/8 BEN (MISCELLANEOUS) ×4 IMPLANT
CONNECTOR 1/2X3/8X1/2 3 WAY (MISCELLANEOUS) ×1
CONNECTOR 1/2X3/8X1/2 3WAY (MISCELLANEOUS) ×2 IMPLANT
CONT SPEC 4OZ CLIKSEAL STRL BL (MISCELLANEOUS) ×4 IMPLANT
COVER BACK TABLE 24X17X13 BIG (DRAPES) ×3 IMPLANT
COVER PROBE W GEL 5X96 (DRAPES) ×1 IMPLANT
CRADLE DONUT ADULT HEAD (MISCELLANEOUS) ×3 IMPLANT
DERMABOND ADHESIVE PROPEN (GAUZE/BANDAGES/DRESSINGS) ×1
DERMABOND ADVANCED (GAUZE/BANDAGES/DRESSINGS) ×2
DERMABOND ADVANCED .7 DNX12 (GAUZE/BANDAGES/DRESSINGS) ×4 IMPLANT
DERMABOND ADVANCED .7 DNX6 (GAUZE/BANDAGES/DRESSINGS) IMPLANT
DEVICE PMI PUNCTURE CLOSURE (MISCELLANEOUS) ×3 IMPLANT
DEVICE SUT CK QUICK LOAD MINI (Prosthesis & Implant Heart) ×1 IMPLANT
DEVICE TROCAR PUNCTURE CLOSURE (ENDOMECHANICALS) ×3 IMPLANT
DRAIN CHANNEL 28F RND 3/8 FF (WOUND CARE) ×6 IMPLANT
DRAPE BILATERAL SPLIT (DRAPES) ×3 IMPLANT
DRAPE C-ARM 42X72 X-RAY (DRAPES) ×3 IMPLANT
DRAPE CV SPLIT W-CLR ANES SCRN (DRAPES) ×3 IMPLANT
DRAPE HALF SHEET 40X57 (DRAPES) ×1 IMPLANT
DRAPE INCISE IOBAN 66X45 STRL (DRAPES) ×9 IMPLANT
DRAPE SLUSH/WARMER DISC (DRAPES) ×3 IMPLANT
DRSG COVADERM 4X8 (GAUZE/BANDAGES/DRESSINGS) ×3 IMPLANT
ELECT BLADE 6.5 EXT (BLADE) ×3 IMPLANT
ELECT REM PT RETURN 9FT ADLT (ELECTROSURGICAL) ×6
ELECTRODE REM PT RTRN 9FT ADLT (ELECTROSURGICAL) ×4 IMPLANT
FELT TEFLON 1X6 (MISCELLANEOUS) ×6 IMPLANT
FEMORAL VENOUS CANN RAP (CANNULA) IMPLANT
GAUZE SPONGE 4X4 12PLY STRL (GAUZE/BANDAGES/DRESSINGS) ×1 IMPLANT
GLOVE BIO SURGEON STRL SZ 6.5 (GLOVE) ×8 IMPLANT
GLOVE ORTHO TXT STRL SZ7.5 (GLOVE) ×11 IMPLANT
GOWN STRL REUS W/ TWL LRG LVL3 (GOWN DISPOSABLE) ×8 IMPLANT
GOWN STRL REUS W/TWL LRG LVL3 (GOWN DISPOSABLE) ×12
HANDLE STAPLE ENDO GIA SHORT (STAPLE) ×1
KIT BASIN OR (CUSTOM PROCEDURE TRAY) ×3 IMPLANT
KIT DILATOR VASC 18G NDL (KITS) ×4 IMPLANT
KIT DRAINAGE VACCUM ASSIST (KITS) ×1 IMPLANT
KIT ROOM TURNOVER OR (KITS) ×3 IMPLANT
KIT SUCTION CATH 14FR (SUCTIONS) ×3 IMPLANT
KIT SUT CK MINI COMBO 4X17 (Prosthesis & Implant Heart) ×1 IMPLANT
LEAD PACING MYOCARDI (MISCELLANEOUS) ×5 IMPLANT
LINE VENT (MISCELLANEOUS) ×1 IMPLANT
NDL AORTIC ROOT 14G 7F (CATHETERS) ×2 IMPLANT
NEEDLE AORTIC ROOT 14G 7F (CATHETERS) ×3 IMPLANT
NS IRRIG 1000ML POUR BTL (IV SOLUTION) ×15 IMPLANT
PACK OPEN HEART (CUSTOM PROCEDURE TRAY) ×3 IMPLANT
PAD ARMBOARD 7.5X6 YLW CONV (MISCELLANEOUS) ×6 IMPLANT
PAD ELECT DEFIB RADIOL ZOLL (MISCELLANEOUS) ×3 IMPLANT
PUNCH AORTIC ROT 4.0MM RCL 40 (MISCELLANEOUS) ×1 IMPLANT
RELOAD TRI 2.0 30 VAS MED SUL (STAPLE) ×1 IMPLANT
RING MCCARTHY ADAMS M26 (Prosthesis & Implant Heart) ×1 IMPLANT
SEALANT SURG COSEAL 8ML (VASCULAR PRODUCTS) ×1 IMPLANT
SET CANNULATION TOURNIQUET (MISCELLANEOUS) ×3 IMPLANT
SET CARDIOPLEGIA MPS 5001102 (MISCELLANEOUS) ×1 IMPLANT
SET IRRIG TUBING LAPAROSCOPIC (IRRIGATION / IRRIGATOR) ×3 IMPLANT
SHEATH AVANTI 11CM 5FR (MISCELLANEOUS) ×1 IMPLANT
SOLUTION ANTI FOG 6CC (MISCELLANEOUS) ×3 IMPLANT
SPONGE GAUZE 4X4 12PLY STER LF (GAUZE/BANDAGES/DRESSINGS) ×3 IMPLANT
SPONGE LAP 4X18 X RAY DECT (DISPOSABLE) ×1 IMPLANT
STAPLER ENDO GIA 12 SHRT THIN (STAPLE) IMPLANT
STAPLER ENDO GIA 12MM SHORT (STAPLE) ×2 IMPLANT
STOPCOCK 4 WAY LG BORE MALE ST (IV SETS) ×1 IMPLANT
SUT BONE WAX W31G (SUTURE) ×3 IMPLANT
SUT E-PACK MINIMALLY INVASIVE (SUTURE) ×3 IMPLANT
SUT ETHIBOND (SUTURE) ×2 IMPLANT
SUT ETHIBOND 2 0 SH (SUTURE) ×1 IMPLANT
SUT ETHIBOND 2-0 RB-1 WHT (SUTURE) ×3 IMPLANT
SUT ETHIBOND X763 2 0 SH 1 (SUTURE) ×3 IMPLANT
SUT GORETEX CV 4 TH 22 36 (SUTURE) ×3 IMPLANT
SUT GORETEX CV4 TH-18 (SUTURE) ×6 IMPLANT
SUT PROLENE 3 0 SH1 36 (SUTURE) ×12 IMPLANT
SUT PROLENE 4 0 RB 1 (SUTURE) ×9
SUT PROLENE 4-0 RB1 .5 CRCL 36 (SUTURE) IMPLANT
SUT PROLENE 5 0 C 1 36 (SUTURE) ×3 IMPLANT
SUT PROLENE 6 0 C 1 30 (SUTURE) ×2 IMPLANT
SUT PROLENE 8 0 BV175 6 (SUTURE) ×2 IMPLANT
SUT SILK  1 MH (SUTURE) ×1
SUT SILK 1 MH (SUTURE) IMPLANT
SUT SILK 2 0 SH CR/8 (SUTURE) IMPLANT
SUT SILK 3 0 SH CR/8 (SUTURE) IMPLANT
SUT VIC AB 2-0 CTX 27 (SUTURE) ×1 IMPLANT
SUT VIC AB 2-0 CTX 36 (SUTURE) IMPLANT
SUT VIC AB 3-0 SH 8-18 (SUTURE) ×1 IMPLANT
SUT VICRYL 2 TP 1 (SUTURE) IMPLANT
SYR 10ML LL (SYRINGE) ×3 IMPLANT
SYSTEM SAHARA CHEST DRAIN ATS (WOUND CARE) ×3 IMPLANT
TAPE CLOTH SURG 4X10 WHT LF (GAUZE/BANDAGES/DRESSINGS) ×1 IMPLANT
TAPE PAPER 2X10 WHT MICROPORE (GAUZE/BANDAGES/DRESSINGS) ×1 IMPLANT
TOWEL GREEN STERILE (TOWEL DISPOSABLE) ×12 IMPLANT
TOWEL GREEN STERILE FF (TOWEL DISPOSABLE) ×6 IMPLANT
TOWEL OR 17X24 6PK STRL BLUE (TOWEL DISPOSABLE) ×6 IMPLANT
TOWEL OR 17X26 10 PK STRL BLUE (TOWEL DISPOSABLE) ×6 IMPLANT
TRAY CATH LUMEN 1 20CM STRL (SET/KITS/TRAYS/PACK) ×1 IMPLANT
TRAY FOLEY SILVER 16FR TEMP (SET/KITS/TRAYS/PACK) ×3 IMPLANT
TROCAR XCEL BLADELESS 5X75MML (TROCAR) ×3 IMPLANT
TROCAR XCEL NON-BLD 11X100MML (ENDOMECHANICALS) ×6 IMPLANT
TUBE CONNECTING 12X1/4 (SUCTIONS) ×1 IMPLANT
TUBE SUCT INTRACARD DLP 20F (MISCELLANEOUS) ×3 IMPLANT
TUBING ART PRESS 48 MALE/FEM (TUBING) ×2 IMPLANT
TUNNELER SHEATH ON-Q 11GX8 DSP (PAIN MANAGEMENT) ×1 IMPLANT
UNDERPAD 30X30 (UNDERPADS AND DIAPERS) ×3 IMPLANT
WATER STERILE IRR 1000ML POUR (IV SOLUTION) ×6 IMPLANT
WIRE .035 3MM-J 145CM (WIRE) ×3 IMPLANT
YANKAUER SUCT BULB TIP NO VENT (SUCTIONS) ×1 IMPLANT

## 2016-08-25 NOTE — Brief Op Note (Signed)
08/17/2016 - 08/25/2016  12:02 PM  PATIENT:  [name]  51 y.o. female  PRE-OPERATIVE DIAGNOSIS:  SEVERE MR  POST-OPERATIVE DIAGNOSIS:  SEVERE MR  PROCEDURE:  TRANSESOPHAGEAL ECHOCARDIOGRAM (TEE), MINIMALLY INVASIVE MITRAL VALVE REPAIR  (using an Annuloplasty ring, size 26, model 4100, and serial number [id number])  SURGEON:  Surgeon(s) and Role:    Purcell Nails, MD - Primary  PHYSICIAN ASSISTANT: Doree Fudge PA-C  ANESTHESIA:   general  EBL:  Total I/O In: -  Out: 600 [Urine:600]  DRAINS: Chest tubes placed in the right pleural space   COUNTS CORRECT:  YES  DICTATION: .Dragon Dictation  PLAN OF CARE: Admit to inpatient   PATIENT DISPOSITION:  ICU - intubated and hemodynamically stable.   Delay start of Pharmacological VTE agent (>24hrs) due to surgical blood loss or risk of bleeding: yes  BASELINE WEIGHT: 58.3 kg

## 2016-08-25 NOTE — Op Note (Signed)
CARDIOTHORACIC SURGERY OPERATIVE NOTE  Date of Procedure:  08/25/2016  Preoperative Diagnosis: Severe Mitral Regurgitation  Postoperative Diagnosis: Same  Procedure:    Minimally-Invasive Mitral Valve Repair  Placement of left femoral arterial line  Right axillary artery cannulation for cardiopulmonary bypass  Edwards McArthy-Adams IMR ETLogix Ring Annuloplasty (size 26mm, model # 4100, serial # [id number])  Placement of On-Q pain management catheter    Surgeon: Salvatore Decent. Cornelius Moras, MD  Assistant: Ardelle Balls, PA-C  Anesthesia: Heather Roberts, MD  Operative Findings:  Severe ischemic mitral regurgitation with combination of type I and type IIIB mitral valve dysfunction  Moderate left ventricular systolic dysfunction  Moderate (2+) central aortic insufficiency  Normal RV size and function               BRIEF CLINICAL NOTE AND INDICATIONS FOR SURGERY  Patient is a 51 year old female with history of coronary artery disease status post acute lateral wall myocardial infarction in January 2015, mitral regurgitation, chronic systolic congestive heart failure, left bundle branch block, hypertension, peripheral vascular disease, and hyperlipidemia who has been referred for surgical consultation to discuss treatment options for management of severe symptomatic mitral regurgitation with recurrent episodes of acute exacerbation of chronic congestive heart failure. The patient's cardiac history dates back to January 2015 when she presented acutely with a lateral wall myocardial infarction. She was treated with PCI and stenting of the left circumflex coronary artery using a drug-eluting stent.  She has been followed regularly by Dr. Eldridge Dace and reports that she has had significant exertional shortness of breath ever since.  Echocardiograms over the last 2 years have demonstrated the presence of moderate to severe left ventricular systolic dysfunction with at least moderate mitral  regurgitation.  Follow-up catheterization performed in septic timbre of 2016 revealed patent stent and left circumflex coronary artery. She developed a retroperitoneal bleed requiring surgical repair of her right common femoral artery at that time. She has been hospitalized several times with acute exacerbation of congestive heart failure. She apparently had another catheterization in 2017 in [place], which again demonstrated patent stent in the left circumflex territory by report.  She was last seen by Dr. Eldridge Dace in April of this year at which time she was doing a little better on medical therapy, although she continued to complain of symptoms of significant exertional shortness of breath.  The patient states that she was hospitalized last month in [place] with another episode of acute shortness of breath. She was told that she had pneumonia at the time although she denies any cough or fevers, and she states that symptoms were marginally improved at the time of hospital discharge.  An echocardiogram was performed at that time but apparently not read until recently. She was told by Dr. Hyacinth Meeker that her mitral valve was leaking. She was hospitalized again several days ago with worsening shortness breath and orthopnea. She was transferred to [facility] for management.  Echocardiogram performed 08/17/2016 revealed at least moderate left ventricular systolic dysfunction with ejection fraction estimated 35-40%. There was severe mitral regurgitation with flow reversal in the pulmonary veins. There is mild left atrial enlargement.  Transesophageal echocardiogram was performed earlier today and reported to demonstrate moderate global left ventricular systolic dysfunction with mild aortic insufficiency, restricted posterior leaflet, and severe central mitral regurgitation. Cardiothoracic surgical consultation was requested.  The patient has been seen in consultation and counseled at length  regarding the indications, risks and potential benefits of surgery.  All questions have been answered, and  the patient provides full informed consent for the operation as described.    DETAILS OF THE OPERATIVE PROCEDURE  Preparation:  The patient is brought to the operating room on the above mentioned date and central monitoring was established by the anesthesia team including placement of Swan-Ganz catheter through the left internal jugular vein.  A right radial arterial line is placed. The patient is placed in the supine position on the operating table.  Intravenous antibiotics are administered. General endotracheal anesthesia is induced uneventfully. The patient is initially intubated using a dual lumen endotracheal tube.  A Foley catheter is placed.  Baseline transesophageal echocardiogram was performed.  Findings were notable for findings consistent with ischemic mitral regurgitation. There was moderate global left ventricular chamber enlargement and systolic dysfunction with ejection fraction estimated 40%. The mitral valve leaflets appeared very mildly thickened but had reasonably good mobility. There appeared to be some systolic restriction of the posterior leaflet. There was annular dilatation. The plane of coaptation was downward displaced. There was a broad central jet of mitral regurgitation. The left atrium was enlarged.  The aortic valve was trileaflet. There was moderate (2+) central aortic insufficiency. Right ventricular size and function appeared normal. There was mild tricuspid regurgitation.  A soft roll is placed behind the patient's left scapula and the neck gently extended and turned to the left.   The patient's right neck, chest, abdomen, both groins, and both lower extremities are prepared and draped in a sterile manner. A time out procedure is performed.   Surgical Approach:  A 16-gauge femoral arterial line is placed in the left common femoral artery for monitoring  purposes using the Seldinger technique under ultrasound guidance.  The left common femoral vein is cannulated percutaneously using the Seldinger technique under ultrasound guidance and a 5 French sheath advanced into the femoral vein.  The patient is placed in Trendelenburg position. The right internal jugular vein is cannulated with Seldinger technique and a guidewire advanced into the right atrium.   A right miniature anterolateral thoracotomy incision is performed. The incision is placed just lateral to and superior to the right nipple. The pectoralis major muscle is retracted medially and completely preserved. The right pleural space is entered through the 3rd intercostal space. A soft tissue retractor is placed.  Two 11 mm ports are placed through separate stab incisions inferiorly. The right pleural space is insufflated continuously with carbon dioxide gas through the posterior port during the remainder of the operation.  A pledgeted sutures placed through the dome of the right hemidiaphragm and retracted inferiorly to facilitate exposure.  A longitudinal incision is made in the pericardium 3 cm anterior to the phrenic nerve and silk traction sutures are placed on either side of the incision for exposure.   Extracorporeal Cardiopulmonary Bypass and Myocardial Protection:  A small incision is made in the right deltopectoral groove. The muscle fibers of the pectoralis major muscle are split longitudinally and the deep pectoralis fashion in size. The pectoralis minor muscle is retracted laterally. The right axillary artery is dissected through the incision using sharp dissection and care to avoid the brachial plexus. The right axillary artery is relatively small size but free of significant atherosclerotic disease.  The patient is heparinized systemically.  An 8 mm Gore-Tex graft with pre-attached connection for the cardiopulmonary bypass circuit is sewn to the right axillary artery in end to side  technique using running 5-0 Prolene suture.  The right internal jugular vein is cannulated with a 14 [place] pediatric femoral  venous cannula.    A guidewire is advanced through the left common femoral vein up the vena cava and through the right atrium under transesophageal echocardiogram guidance and the left femoral venous sheath is removed. The femoral vein is cannulated with a long 22 French femoral venous cannula. Adequate heparinization is verified.     The entire pre-bypass portion of the operation was notable for stable hemodynamics.  Cardiopulmonary bypass was begun.  Vacuum assist venous drainage is utilized. The incision in the pericardium is extended in both directions. Venous drainage and exposure are notably excellent. Attempts to place a retrograde cardioplegia cannula through the right atrium into the coronary sinus using transesophageal echocardiogram guidance are unsuccesful.  An antegrade cardioplegia cannula is placed in the ascending aorta.    The patient is cooled to 28C systemic temperature.  The aortic cross clamp is applied and cardioplegia is delivered initially in an antegrade fashion through the aortic root using modified [place] cold blood cardioplegia (KBC protocol).   The initial cardioplegic arrest is rapid with early diastolic arrest.  A total of 90 mL of cardioplegia was administered antegrade through the aortic root.  Myocardial protection was felt to be excellent.   Mitral Valve Repair:  A left atriotomy incision was performed through the interatrial groove and extended partially across the back wall of the left atrium after opening the oblique sinus inferiorly.  The mitral valve is exposed using a self-retaining retractor.  The mitral valve was inspected and notable for a mild amount of thickening and fibrosis of the valve leaflets. The subvalvular apparatus appeared essentially normal. The annulus was dilated. There was no significant calcification.  .  Interrupted 2-0 Ethibond horizontal mattress sutures are placed circumferentially around the entire mitral valve annulus. The sutures will ultimately be utilized for ring annuloplasty, and at this juncture there are utilized to suspend the valve symmetrically.  The valve was tested with saline and appeared competent even without ring annuloplasty complete. The valve was sized to a 26 mm annuloplasty ring, based upon the transverse distance between the left and right commissures and the height of the anterior leaflet, corresponding to a size just slightly larger than the overall surface area of the anterior leaflet.  An Edwards McArthy-Adams IMR ETLogix annuloplasty ring (size 26mm, model #4100, serial [id number]) was secured in place uneventfully. All ring sutures were secured using a Cor-knot device.    The valve was tested with saline and appeared competent. There is no residual leak. There was a broad, symmetrical line of coaptation of the anterior and posterior leaflet which was confirmed using the blue ink test.  Rewarming is begun.   Procedure Completion:  The atriotomy was closed using a 2-layer closure of running 3-0 Prolene suture after placing a sump drain across the mitral valve to serve as a left ventricular vent.  One final dose of warm "reanimation dose" cardioplegia was administered through the aortic root.  The aortic cross clamp was removed after a total cross clamp time of 57 minutes.  Epicardial pacing wires are fixed to the inferior wall of the right ventricule and to the right atrial appendage. The patient is rewarmed to 37C temperature. The left ventricular vent is removed.  The patient is ventilated and flow volumes turndown while the mitral valve repair is inspected using transesophageal echocardiogram. The valve repair appears intact with no residual leak. The antegrade cardioplegia cannula is now removed. The patient is weaned and disconnected from cardiopulmonary bypass.  The  patient's rhythm at  separation from bypass was sinus.  The patient was weaned from bypass on low dose milrinone infusion. Total cardiopulmonary bypass time for the operation was 133 minutes.  Followup transesophageal echocardiogram performed after separation from bypass revealed  a well-seated annuloplasty ring in the mitral position with a normal functioning mitral valve. There was no residual leak.  Left ventricular function was unchanged from preoperatively.  The mean gradient across the mitral valve was estimated to be 2 mmHg.  Protamine was administered to reverse the anticoagulation. The Gore-Tex graft used for arterial inflow to the axillary artery was transected using a vascular stapler close to the anastomosis.  Initially there was minimal return of pulsatile flow in the right radial arterial line. However, over time pulsatile waveform improved. There was a palpable pulse in the axillary artery.  The right internal jugular cannula and left femoral venous cannula were removed and manual pressure held on the neck for 15 minutes.  Manual pressure was held on the left groin for 30 minutes. Single lung ventilation was begun. The atriotomy closure was inspected for hemostasis. The pericardial sac was drained using a 28 French Bard drain placed through the anterior port incision.  The right pleural space is irrigated with saline solution and inspected for hemostasis. An On-Q catheter was tunneled through the subcutaneous space to the right pleural chest tube incision passed through the incision into the chest and then tunneled into the left pleural space posteriorly to cover the second through the sixth intercostal nerve roots. The catheter was flushed with 0.5% bupivacaine solution and ultimately connected to continuous infusion pump. The right pleural space was drained using a 28 French Bard drain placed through the posterior port incision. The miniature thoracotomy incision was closed in multiple layers  in routine fashion.  The incision in the right deltopectoral groove was also closed in multiple layers in routine fashion.  The post-bypass portion of the operation was notable for stable rhythm and hemodynamics.  The patient received 2 units packed red blood cells during the procedure due to anemia which was present preoperatively and exacerbated by acute blood loss and hemodilution during cardiopulmonary bypass.   Disposition:  The patient tolerated the procedure well.  The patient was reintubated using a single lumen endotracheal tube and subsequently transported to the surgical intensive care unit in stable condition. There were no intraoperative complications. All sponge instrument and needle counts are verified correct at completion of the operation.     Salvatore Decent. Cornelius Moras MD 08/25/2016 1:58 PM

## 2016-08-25 NOTE — Anesthesia Procedure Notes (Signed)
Central Venous Catheter Insertion Performed by: Heather Roberts, anesthesiologist Start/End8/24/2018 6:48 AM, 08/25/2016 6:58 AM Patient location: Pre-op. Preanesthetic checklist: patient identified, IV checked, site marked, risks and benefits discussed, surgical consent, monitors and equipment checked, pre-op evaluation, timeout performed and anesthesia consent Position: Trendelenburg Lidocaine 1% used for infiltration and patient sedated Hand hygiene performed , maximum sterile barriers used  and Seldinger technique used Catheter size: 8.5 Fr Total catheter length 8. PA cath was placed.Sheath introducer Swan type:thermodilution PA Cath depth:50 Procedure performed using ultrasound guided technique. Ultrasound Notes:anatomy identified, needle tip was noted to be adjacent to the nerve/plexus identified, no ultrasound evidence of intravascular and/or intraneural injection and image(s) printed for medical record Attempts: 1 Following insertion, line sutured and dressing applied. Post procedure assessment: free fluid flow, blood return through all ports and no air  Patient tolerated the procedure well with no immediate complications.

## 2016-08-25 NOTE — Anesthesia Postprocedure Evaluation (Signed)
Anesthesia Post Note  Patient: Lindsay Camacho  Procedure(s) Performed: Procedure(s) (LRB): MINIMALLY INVASIVE MITRAL VALVE REPAIR (Right) TRANSESOPHAGEAL ECHOCARDIOGRAM (TEE) (N/A)     Patient location during evaluation: SICU Anesthesia Type: General Level of consciousness: sedated Pain management: pain level controlled Vital Signs Assessment: post-procedure vital signs reviewed and stable Respiratory status: patient remains intubated per anesthesia plan Cardiovascular status: stable Anesthetic complications: no    Last Vitals:  Vitals:   08/24/16 1954 08/25/16 0520  BP: (!) 101/49 (!) 120/93  Pulse: 70   Resp: 12 17  Temp: (!) 36.4 C 36.4 C  SpO2: 96% 99%    Last Pain:  Vitals:   08/25/16 0520  TempSrc: Axillary  PainSc:                  Amaira Safley DANIEL

## 2016-08-25 NOTE — Anesthesia Procedure Notes (Signed)
Procedure Name: Intubation Date/Time: 08/25/2016 7:55 AM Performed by: Clearnce Sorrel Pre-anesthesia Checklist: Patient identified, Emergency Drugs available, Suction available, Patient being monitored and Timeout performed Patient Re-evaluated:Patient Re-evaluated prior to induction Oxygen Delivery Method: Circle system utilized Preoxygenation: Pre-oxygenation with 100% oxygen Induction Type: IV induction Ventilation: Mask ventilation without difficulty and Oral airway inserted - appropriate to patient size Laryngoscope Size: Mac and 3 Grade View: Grade I Tube type: Oral Endobronchial tube: Left and Double lumen EBT and 37 Fr Number of attempts: 1 Airway Equipment and Method: Stylet Placement Confirmation: ETT inserted through vocal cords under direct vision,  positive ETCO2 and breath sounds checked- equal and bilateral Tube secured with: Tape Dental Injury: Teeth and Oropharynx as per pre-operative assessment

## 2016-08-25 NOTE — Progress Notes (Signed)
  Echocardiogram Echocardiogram Transesophageal has been performed.  Lindsay Camacho 08/25/2016, 9:02 AM

## 2016-08-25 NOTE — Transfer of Care (Signed)
Immediate Anesthesia Transfer of Care Note  Patient: Lindsay Camacho  Procedure(s) Performed: Procedure(s): MINIMALLY INVASIVE MITRAL VALVE REPAIR (Right) TRANSESOPHAGEAL ECHOCARDIOGRAM (TEE) (N/A)  Patient Location: SICU  Anesthesia Type:General  Level of Consciousness: Patient remains intubated per anesthesia plan  Airway & Oxygen Therapy: Patient remains intubated per anesthesia plan  Post-op Assessment: Report given to RN and Post -op Vital signs reviewed and stable  Post vital signs: Reviewed and stable  Last Vitals:  Vitals:   08/24/16 1954 08/25/16 0520  BP: (!) 101/49 (!) 120/93  Pulse: 70   Resp: 12 17  Temp: (!) 36.4 C 36.4 C  SpO2: 96% 99%    Last Pain:  Vitals:   08/25/16 0520  TempSrc: Axillary  PainSc:       Patients Stated Pain Goal: 0 (08/24/16 1946)  Complications: No apparent anesthesia complications

## 2016-08-25 NOTE — Progress Notes (Signed)
[facility] BRIEF SICU PROGRESS NOTE  Day of Surgery  S/P Procedure(s) (LRB): MINIMALLY INVASIVE MITRAL VALVE REPAIR (Right) TRANSESOPHAGEAL ECHOCARDIOGRAM (TEE) (N/A)   Sedated on vent AAI paced w/ stable hemodynamics on low dose milrinone and dopamine O2 sats 100% Chest tube output low UOP excellent Labs okay  Plan: Continue routine early postop  Purcell Nails, MD 08/25/2016 6:07 PM

## 2016-08-25 NOTE — Progress Notes (Signed)
Rapid Wean Protocol started at this time.   

## 2016-08-25 NOTE — Plan of Care (Signed)
Problem: Physical Regulation: Goal: Ability to maintain clinical measurements within normal limits will improve Outcome: Progressing Vital signs stable and within normal limits Goal: Will remain free from infection Outcome: Progressing Vital signs stable. Displays no signs of infection   Problem: Skin Integrity: Goal: Risk for impaired skin integrity will decrease Outcome: Progressing Verbalizes understanding of need to reposition frequently to prevent skin breakdown

## 2016-08-25 NOTE — Anesthesia Procedure Notes (Signed)
Arterial Line Insertion Start/End8/24/2018 7:04 AM, 08/25/2016 7:04 AM Performed by: CRNA  Patient location: Pre-op. Preanesthetic checklist: patient identified, IV checked, site marked, risks and benefits discussed, surgical consent, monitors and equipment checked, pre-op evaluation, timeout performed and anesthesia consent Lidocaine 1% used for infiltration Right, radial was placed Catheter size: 20 Fr Hand hygiene performed  and maximum sterile barriers used   Attempts: 1 Procedure performed without using ultrasound guided technique. Following insertion, dressing applied. Post procedure assessment: normal and unchanged

## 2016-08-25 NOTE — OR Nursing (Signed)
13:15 - 45 minute call to SICU charge nurse 13:55 - 20 minute call to SICU charge nurse

## 2016-08-25 NOTE — Progress Notes (Signed)
Dr. Cornelius Moras paged and spoke with regarding rapid wean blood gas results. Of note Co2 51. Vital signs relayed and gas results read. Orders received to proceed with extubation. Will continue to monitor closely. Modena Jansky Rn 2 heart

## 2016-08-25 NOTE — Procedures (Signed)
Extubation Procedure Note  Patient Details:   Name: Lindsay Camacho DOB: 1965-11-19 MRN: [id number]   Airway Documentation:     Evaluation  O2 sats: stable throughout Complications: No apparent complications Patient did tolerate procedure well. Bilateral Breath Sounds: Clear   Yes, pt able to hoarsely vocalize name and cough to clear secretions. Pt had NIF of -23 and VC above 4.5L. Pt positive for cuff leak before extubation, RT placed pt on 4L nasal cannula. Pt tolerating well at this time.   [name] 08/25/2016, 11:40 PM

## 2016-08-26 ENCOUNTER — Inpatient Hospital Stay (HOSPITAL_COMMUNITY): Payer: Self-pay

## 2016-08-26 ENCOUNTER — Encounter (HOSPITAL_COMMUNITY): Payer: Self-pay | Admitting: Thoracic Surgery (Cardiothoracic Vascular Surgery)

## 2016-08-26 DIAGNOSIS — Z9889 Other specified postprocedural states: Secondary | ICD-10-CM

## 2016-08-26 LAB — CREATININE, SERUM
CREATININE: 0.65 mg/dL (ref 0.44–1.00)
GFR calc Af Amer: >60 mL/min (ref >60)

## 2016-08-26 LAB — COOXEMETRY PANEL
Carboxyhemoglobin: 1.2 % (ref 0.5–1.5)
METHEMOGLOBIN: 0.6 % (ref 0.0–1.5)
O2 Saturation: 57.5 %
Total hemoglobin: 8.5 g/dL — ABNORMAL LOW (ref 12.0–16.0)

## 2016-08-26 LAB — POCT I-STAT 3, ART BLOOD GAS (G3+)
Acid-base deficit: 1 mmol/L (ref 0.0–2.0)
Bicarbonate: 24.5 mmol/L (ref 20.0–28.0)
O2 SAT: 98 %
PCO2 ART: 44.3 mmHg (ref 32.0–48.0)
PH ART: 7.355 (ref 7.350–7.450)
TCO2: 26 mmol/L (ref 22–32)
pO2, Arterial: 121 mmHg — ABNORMAL HIGH (ref 83.0–108.0)

## 2016-08-26 LAB — PREPARE FRESH FROZEN PLASMA
UNIT DIVISION: 0
Unit division: 0

## 2016-08-26 LAB — POCT I-STAT, CHEM 8
BUN: 9 mg/dL (ref 6–20)
CREATININE: 0.5 mg/dL (ref 0.44–1.00)
Calcium, Ion: 1.14 mmol/L — ABNORMAL LOW (ref 1.15–1.40)
Chloride: 101 mmol/L (ref 101–111)
GLUCOSE: 108 mg/dL — AB (ref 65–99)
HEMATOCRIT: 23 % — AB (ref 36.0–46.0)
Hemoglobin: 7.8 g/dL — ABNORMAL LOW (ref 12.0–15.0)
POTASSIUM: 4 mmol/L (ref 3.5–5.1)
Sodium: 137 mmol/L (ref 135–145)
TCO2: 26 mmol/L (ref 22–32)

## 2016-08-26 LAB — CBC
HCT: 26 % — ABNORMAL LOW (ref 36.0–46.0)
HCT: 24.6 % — ABNORMAL LOW (ref 36.0–46.0)
HEMOGLOBIN: 8.6 g/dL — AB (ref 12.0–15.0)
HEMOGLOBIN: 8.1 g/dL — AB (ref 12.0–15.0)
MCH: 28.2 pg (ref 26.0–34.0)
MCH: 27.8 pg (ref 26.0–34.0)
MCHC: 33.1 g/dL (ref 30.0–36.0)
MCHC: 32.9 g/dL (ref 30.0–36.0)
MCV: 85.2 fL (ref 78.0–100.0)
MCV: 84.5 fL (ref 78.0–100.0)
PLATELETS: 112 10*3/uL — AB (ref 150–400)
PLATELETS: 80 10*3/uL — AB (ref 150–400)
RBC: 3.05 MIL/uL — AB (ref 3.87–5.11)
RBC: 2.91 MIL/uL — AB (ref 3.87–5.11)
RDW: 14.8 % (ref 11.5–15.5)
RDW: 14.7 % (ref 11.5–15.5)
WBC: 8.9 10*3/uL (ref 4.0–10.5)
WBC: 8.5 10*3/uL (ref 4.0–10.5)

## 2016-08-26 LAB — BPAM FFP
BLOOD PRODUCT EXPIRATION DATE: 201808262359
BLOOD PRODUCT EXPIRATION DATE: 201808262359
ISSUE DATE / TIME: 201808241847
ISSUE DATE / TIME: 201808241716
UNIT TYPE AND RH: 6200
Unit Type and Rh: 6200

## 2016-08-26 LAB — BASIC METABOLIC PANEL
ANION GAP: 5 (ref 5–15)
BUN: 7 mg/dL (ref 6–20)
CHLORIDE: 108 mmol/L (ref 101–111)
CO2: 24 mmol/L (ref 22–32)
Calcium: 7.2 mg/dL — ABNORMAL LOW (ref 8.9–10.3)
Creatinine, Ser: 0.69 mg/dL (ref 0.44–1.00)
Glucose, Bld: 103 mg/dL — ABNORMAL HIGH (ref 65–99)
POTASSIUM: 3.9 mmol/L (ref 3.5–5.1)
SODIUM: 137 mmol/L (ref 135–145)

## 2016-08-26 LAB — GLUCOSE, CAPILLARY
GLUCOSE-CAPILLARY: 102 mg/dL — AB (ref 65–99)
GLUCOSE-CAPILLARY: 108 mg/dL — AB (ref 65–99)
GLUCOSE-CAPILLARY: 110 mg/dL — AB (ref 65–99)
GLUCOSE-CAPILLARY: 148 mg/dL — AB (ref 65–99)
Glucose-Capillary: 111 mg/dL — ABNORMAL HIGH (ref 65–99)
Glucose-Capillary: 100 mg/dL — ABNORMAL HIGH (ref 65–99)
Glucose-Capillary: 109 mg/dL — ABNORMAL HIGH (ref 65–99)

## 2016-08-26 LAB — MAGNESIUM
MAGNESIUM: 2.2 mg/dL (ref 1.7–2.4)
MAGNESIUM: 1.8 mg/dL (ref 1.7–2.4)

## 2016-08-26 MED ORDER — FUROSEMIDE 10 MG/ML IJ SOLN
20.0000 mg | Freq: Four times a day (QID) | INTRAMUSCULAR | Status: AC
  Administered 2016-08-26 – 2016-08-27 (×3): 20 mg via INTRAVENOUS
  Filled 2016-08-26 (×3): qty 2

## 2016-08-26 MED ORDER — CHLORHEXIDINE GLUCONATE CLOTH 2 % EX PADS
6.0000 | MEDICATED_PAD | Freq: Every day | CUTANEOUS | Status: DC
  Administered 2016-08-26 – 2016-08-29 (×4): 6 via TOPICAL

## 2016-08-26 MED ORDER — AMIODARONE HCL IN DEXTROSE 360-4.14 MG/200ML-% IV SOLN
INTRAVENOUS | Status: AC
  Filled 2016-08-26: qty 200

## 2016-08-26 MED ORDER — SODIUM CHLORIDE 0.9% FLUSH
10.0000 mL | Freq: Two times a day (BID) | INTRAVENOUS | Status: DC
  Administered 2016-08-26 – 2016-08-28 (×4): 10 mL
  Administered 2016-08-29: 20 mL

## 2016-08-26 MED ORDER — METOCLOPRAMIDE HCL 5 MG/ML IJ SOLN
10.0000 mg | Freq: Four times a day (QID) | INTRAMUSCULAR | Status: DC
  Administered 2016-08-26 – 2016-08-30 (×15): 10 mg via INTRAVENOUS
  Filled 2016-08-26 (×16): qty 2

## 2016-08-26 MED ORDER — SODIUM CHLORIDE 0.9% FLUSH: 10.0000 mL | INTRAVENOUS | Status: DC | PRN

## 2016-08-26 MED ORDER — WARFARIN - PHYSICIAN DOSING INPATIENT: Freq: Every day | Status: DC

## 2016-08-26 MED ORDER — WARFARIN SODIUM 2 MG PO TABS
2.0000 mg | ORAL_TABLET | Freq: Every day | ORAL | Status: DC
  Administered 2016-08-27 – 2016-08-28 (×2): 2 mg via ORAL
  Filled 2016-08-26 (×2): qty 1

## 2016-08-26 MED ORDER — ORAL CARE MOUTH RINSE
15.0000 mL | Freq: Four times a day (QID) | OROMUCOSAL | Status: DC
  Administered 2016-08-26 – 2016-08-30 (×6): 15 mL via OROMUCOSAL

## 2016-08-26 NOTE — Plan of Care (Signed)
Problem: Activity: Goal: Risk for activity intolerance will decrease Outcome: Progressing Pt to up oob in AM  Problem: Bowel/Gastric: Goal: Gastrointestinal status for postoperative course will improve Outcome: Progressing Tolerating ice chips  Problem: Cardiac: Goal: Hemodynamic stability will improve Outcome: Progressing Weaning vasoactive gtt's Goal: Ability to maintain an adequate cardiac output will improve Outcome: Progressing Within parameters Goal: Will show no signs and symptoms of excessive bleeding Outcome: Progressing Within parameters  Problem: Education: Goal: Ability to demonstrate proper wound care will improve Outcome: Progressing Education ongoing

## 2016-08-26 NOTE — Progress Notes (Addendum)
Left Femoral Art Line was removed per MD order. Pt tolerated procedure well and V/S were within normal limits.

## 2016-08-26 NOTE — Progress Notes (Signed)
RT placed pt on BiPAP at this time per MD written order for QHS. Pt tolerating well at this time, RT to continue to monitor as needed.

## 2016-08-26 NOTE — Progress Notes (Signed)
[facility] BRIEF SICU PROGRESS NOTE  1 Day Post-Op  S/P Procedure(s) (LRB): MINIMALLY INVASIVE MITRAL VALVE REPAIR (Right) TRANSESOPHAGEAL ECHOCARDIOGRAM (TEE) (N/A)   Stable day AAI paced rhythm w/ stable BP off Neo Breathing comfortably w/ O2 sats 99-100% on 2 L/min UOP adequate Labs okay   Plan: Start lasix to stimulate diuresis  Purcell Nails, MD 08/26/2016 7:06 PM

## 2016-08-26 NOTE — Progress Notes (Addendum)
[address]       [name] [address]             [phone]        CARDIOTHORACIC SURGERY PROGRESS NOTE   R1 Day Post-Op Procedure(s) (LRB): MINIMALLY INVASIVE MITRAL VALVE REPAIR (Right) TRANSESOPHAGEAL ECHOCARDIOGRAM (TEE) (N/A)  Subjective: Looks good.  Feels sore in chest.  Breathing comfortably  Objective: Vital signs: BP Readings from Last 1 Encounters:  08/26/16 (!) 79/55   Pulse Readings from Last 1 Encounters:  08/26/16 80   Resp Readings from Last 1 Encounters:  08/26/16 19   Temp Readings from Last 1 Encounters:  08/26/16 100 F (37.8 C)    Hemodynamics: PAP: (30-48)/(12-30) 36/19 CO:  [2 L/min-3.5 L/min] 3.5 L/min CI:  [1.3 L/min/m2-2.3 L/min/m2] 2.3 L/min/m2  Physical Exam:  Rhythm:   Sinus - AAI paced  Breath sounds: clear  Heart sounds:  RRR w/out murmur  Incisions:  Dressings dry, intact  Abdomen:  Soft, non-distended, non-tender  Extremities:  Warm, well-perfused  Chest tubes:  low volume thin serosanguinous output, no air leak    Intake/Output from previous day: 08/24 0701 - 08/25 0700 In: 7436.2 [I.V.:5395.2; Blood:991; IV Piggyback:1050] Out: 5995 [Urine:4435; Blood:1000; Chest Tube:560] Intake/Output this shift: No intake/output data recorded.  Lab Results:  CBC: Recent Labs  08/25/16 2026 08/26/16 0425  WBC 6.8 8.9  HGB 8.5* 8.6*  HCT 26.1* 26.0*  PLT 83* 112*    BMET:  Recent Labs  08/25/16 0525  08/25/16 2025 08/25/16 2026 08/26/16 0425  NA 141  < > 146*  --  137  K 4.1  < > 4.2  --  3.9  CL 101  < > 103  --  108  CO2 31  --   --   --  24  GLUCOSE 84  < > 117*  --  103*  BUN 12  < > 10  --  7  CREATININE 0.99  < > 0.70 0.81 0.69  CALCIUM 9.2  --   --   --  7.2*  < > = values in this interval not displayed.   PT/INR:   Recent Labs  08/25/16 1450  LABPROT 22.6*  INR 1.96    CBG (last 3)   Recent Labs  08/25/16 2023 08/26/16 0026 08/26/16 0450  GLUCAP 112* 108* 111*     ABG    Component Value Date/Time   PHART 7.355 08/26/2016 0413   PCO2ART 44.3 08/26/2016 0413   PO2ART 121.0 (H) 08/26/2016 0413   HCO3 24.5 08/26/2016 0413   TCO2 26 08/26/2016 0413   ACIDBASEDEF 1.0 08/26/2016 0413   O2SAT 98.0 08/26/2016 0413    CXR: Looks good.  Mild right lung opacity and bilateral atelectasis R>L   EKG: NSR w/out acute ischemic changes, LBBB (old)   Assessment/Plan: S/P Procedure(s) (LRB): MINIMALLY INVASIVE MITRAL VALVE REPAIR (Right) TRANSESOPHAGEAL ECHOCARDIOGRAM (TEE) (N/A)  Doing well POD1 Maintaining NSR w/ stable hemodynamics on low dose milrinone and dopamine, low dose Neo for BP support Post-op atrial fibrillation in OR, maintaining NSR on IV amiodarone Breathing comfortably w/ O2 sats 100% on 6 L/min Expected post op acute blood loss anemia w/ baseline anemia preop, Hgb 8.6 stable Chronic systolic CHF with expected post-op volume excess, weight reportedly up 15 lbs > preop Expected post op atelectasis, mild, R>L Post op thrombocytopenia, mild, platelet count 112k this morning Severe PVD w/ left subclavian stenosis, aortoiliac occlusive disease   D/C femoral Aline  Mobilize  Wean drips  Diuresis  Pulm toilet  Coumadin   Purcell Nails, MD 08/26/2016 8:08 AM

## 2016-08-27 ENCOUNTER — Inpatient Hospital Stay (HOSPITAL_COMMUNITY): Payer: Self-pay

## 2016-08-27 LAB — BASIC METABOLIC PANEL
ANION GAP: 7 (ref 5–15)
BUN: 8 mg/dL (ref 6–20)
CALCIUM: 8.1 mg/dL — AB (ref 8.9–10.3)
CO2: 27 mmol/L (ref 22–32)
Chloride: 101 mmol/L (ref 101–111)
Creatinine, Ser: 0.75 mg/dL (ref 0.44–1.00)
GLUCOSE: 124 mg/dL — AB (ref 65–99)
POTASSIUM: 3.2 mmol/L — AB (ref 3.5–5.1)
Sodium: 135 mmol/L (ref 135–145)

## 2016-08-27 LAB — GLUCOSE, CAPILLARY
GLUCOSE-CAPILLARY: 109 mg/dL — AB (ref 65–99)
GLUCOSE-CAPILLARY: 114 mg/dL — AB (ref 65–99)
GLUCOSE-CAPILLARY: 113 mg/dL — AB (ref 65–99)
Glucose-Capillary: 115 mg/dL — ABNORMAL HIGH (ref 65–99)
Glucose-Capillary: 108 mg/dL — ABNORMAL HIGH (ref 65–99)

## 2016-08-27 LAB — CBC
HEMATOCRIT: 26.1 % — AB (ref 36.0–46.0)
HEMOGLOBIN: 8.6 g/dL — AB (ref 12.0–15.0)
MCH: 28.2 pg (ref 26.0–34.0)
MCHC: 33 g/dL (ref 30.0–36.0)
MCV: 85.6 fL (ref 78.0–100.0)
Platelets: 93 10*3/uL — ABNORMAL LOW (ref 150–400)
RBC: 3.05 MIL/uL — AB (ref 3.87–5.11)
RDW: 14.8 % (ref 11.5–15.5)
WBC: 8.9 10*3/uL (ref 4.0–10.5)

## 2016-08-27 LAB — COOXEMETRY PANEL
Carboxyhemoglobin: 1 % (ref 0.5–1.5)
Methemoglobin: 1.3 % (ref 0.0–1.5)
O2 SAT: 61.3 %
TOTAL HEMOGLOBIN: 8.2 g/dL — AB (ref 12.0–16.0)

## 2016-08-27 LAB — PROTIME-INR
INR: 1.41
Prothrombin Time: 17.3 seconds — ABNORMAL HIGH (ref 11.4–15.2)

## 2016-08-27 MED ORDER — MOVING RIGHT ALONG BOOK
Freq: Once | Status: AC
  Administered 2016-08-27: 10:00:00
  Filled 2016-08-27: qty 1

## 2016-08-27 MED ORDER — ENOXAPARIN SODIUM 30 MG/0.3ML ~~LOC~~ SOLN: 30.0000 mg | SUBCUTANEOUS | Status: DC

## 2016-08-27 MED ORDER — POTASSIUM CHLORIDE 10 MEQ/50ML IV SOLN
10.0000 meq | INTRAVENOUS | Status: AC
  Administered 2016-08-27 (×2): 10 meq via INTRAVENOUS
  Filled 2016-08-27 (×2): qty 50

## 2016-08-27 MED ORDER — AMIODARONE HCL IN DEXTROSE 360-4.14 MG/200ML-% IV SOLN
INTRAVENOUS | Status: AC
  Administered 2016-08-27: 30 mg/h
  Filled 2016-08-27: qty 200

## 2016-08-27 MED ORDER — INSULIN ASPART 100 UNIT/ML ~~LOC~~ SOLN
0.0000 [IU] | Freq: Three times a day (TID) | SUBCUTANEOUS | Status: DC
Start: 2016-08-27 — End: 2016-08-29
  Administered 2016-08-27: 2 [IU] via SUBCUTANEOUS

## 2016-08-27 MED ORDER — SODIUM CHLORIDE 0.9 % IV SOLN: 250.0000 mL | INTRAVENOUS | Status: DC | PRN

## 2016-08-27 MED ORDER — SODIUM CHLORIDE 0.9% FLUSH
3.0000 mL | Freq: Two times a day (BID) | INTRAVENOUS | Status: DC
  Administered 2016-08-27: 3 mL via INTRAVENOUS
  Administered 2016-08-27: 10 mL via INTRAVENOUS
  Administered 2016-08-28 – 2016-08-30 (×3): 3 mL via INTRAVENOUS

## 2016-08-27 MED ORDER — CARVEDILOL 6.25 MG PO TABS
6.2500 mg | ORAL_TABLET | Freq: Two times a day (BID) | ORAL | Status: DC
Start: 2016-08-27 — End: 2016-08-31
  Administered 2016-08-27 – 2016-08-31 (×7): 6.25 mg via ORAL
  Filled 2016-08-27 (×8): qty 1

## 2016-08-27 MED ORDER — FUROSEMIDE 10 MG/ML IJ SOLN
20.0000 mg | Freq: Four times a day (QID) | INTRAMUSCULAR | Status: AC
  Administered 2016-08-27 (×3): 20 mg via INTRAVENOUS
  Filled 2016-08-27 (×3): qty 2

## 2016-08-27 MED ORDER — SODIUM CHLORIDE 0.9% FLUSH: 3.0000 mL | INTRAVENOUS | Status: DC | PRN

## 2016-08-27 MED ORDER — POTASSIUM CHLORIDE 10 MEQ/50ML IV SOLN
10.0000 meq | INTRAVENOUS | Status: AC
  Administered 2016-08-27 (×3): 10 meq via INTRAVENOUS
  Filled 2016-08-27 (×3): qty 50

## 2016-08-27 NOTE — Progress Notes (Signed)
[address]       [name] [address]             [phone]        CARDIOTHORACIC SURGERY PROGRESS NOTE   R2 Days Post-Op Procedure(s) (LRB): MINIMALLY INVASIVE MITRAL VALVE REPAIR (Right) TRANSESOPHAGEAL ECHOCARDIOGRAM (TEE) (N/A)  Subjective: Some soreness in chest.  Denies SOB  Objective: Vital signs: BP Readings from Last 1 Encounters:  08/27/16 118/74   Pulse Readings from Last 1 Encounters:  08/27/16 62   Resp Readings from Last 1 Encounters:  08/27/16 16   Temp Readings from Last 1 Encounters:  08/27/16 97.6 F (36.4 C) (Oral)    Hemodynamics: PAP: (26-48)/(7-30) 29/7  Mixed venous co-ox 61%   Physical Exam:  Rhythm:   Junctional 60-70  Breath sounds: Coarse but fairly clear  Heart sounds:  RRR w/out murmur  Incisions:  Dressings dry, intact  Abdomen:  Soft, non-distended, non-tender  Extremities:  Warm, well-perfused  Chest tubes:  decreasing volume thin serosanguinous output, no air leak   Intake/Output from previous day: 08/25 0701 - 08/26 0700 In: 1365.8 [I.V.:1265.8; IV Piggyback:100] Out: 2640 [Urine:2150; Chest Tube:490] Intake/Output this shift: Total I/O In: 122.4 [I.V.:72.4; IV Piggyback:50] Out: 70 [Urine:60; Chest Tube:10]  Lab Results:  CBC: Recent Labs  08/26/16 1524 08/26/16 1532 08/27/16 0404  WBC 8.5  --  8.9  HGB 8.1* 7.8* 8.6*  HCT 24.6* 23.0* 26.1*  PLT 80*  --  93*    BMET:  Recent Labs  08/26/16 0425  08/26/16 1532 08/27/16 0404  NA 137  --  137 135  K 3.9  --  4.0 3.2*  CL 108  --  101 101  CO2 24  --   --  27  GLUCOSE 103*  --  108* 124*  BUN 7  --  9 8  CREATININE 0.69  < > 0.50 0.75  CALCIUM 7.2*  --   --  8.1*  < > = values in this interval not displayed.   PT/INR:   Recent Labs  08/27/16 0404  LABPROT 17.3*  INR 1.41    CBG (last 3)   Recent Labs  08/26/16 2342 08/27/16 0345 08/27/16 0805  GLUCAP 102* 109* 114*    ABG    Component Value Date/Time   PHART 7.355 08/26/2016 0413   PCO2ART 44.3 08/26/2016 0413   PO2ART 121.0 (H) 08/26/2016 0413   HCO3 24.5 08/26/2016 0413   TCO2 26 08/26/2016 1532   ACIDBASEDEF 1.0 08/26/2016 0413   O2SAT 61.3 08/27/2016 0426    CXR: PORTABLE CHEST 1 VIEW  COMPARISON:  08/26/2016.  FINDINGS: Cardiomegaly is noted. Mitral valve prosthesis is noted. Swan-Ganz catheter has been removed. RIGHT chest tube in good position. RIGHT-sided pulmonary opacity is improving. No pneumothorax.  IMPRESSION: Improved chest radiograph.  Satisfactory postop MVR.   Electronically Signed   By: Elsie Stain M.D.   On: 08/27/2016 07:31   Assessment/Plan: S/P Procedure(s) (LRB): MINIMALLY INVASIVE MITRAL VALVE REPAIR (Right) TRANSESOPHAGEAL ECHOCARDIOGRAM (TEE) (N/A)  Overall doing well POD2 Maintaining NSR/junctional rhythm w/ stable BP off Neo on low dose milrinone Breathing comfortably w/ O2 sats 98-100% on 2 L/min Expected post op acute blood loss anemia w/ baseline anemia preop, Hgb 8.6 stable Chronic systolic CHF with expected post-op volume excess, diuresing and weight down 3 lbs but still reportedly up 12 lbs > preop Expected post op atelectasis, mild, R>L Post op thrombocytopenia, mild, platelet count 93k this morning Severe PVD  w/ left subclavian stenosis, aortoiliac occlusive disease Hypokalemia, induced by loop diuretics   Mobilize  Diuresis  Pulm toilet  Stop amiodarone drip  Supplement potassium  Coumadin  Purcell Nails, MD 08/27/2016 9:14 AM

## 2016-08-27 NOTE — Plan of Care (Signed)
Problem: Activity: Goal: Risk for activity intolerance will decrease Outcome: Progressing Up oob x3  Problem: Bowel/Gastric: Goal: Gastrointestinal status for postoperative course will improve Outcome: Not Progressing Persistant nausea and vomiting  Problem: Cardiac: Goal: Hemodynamic stability will improve Outcome: Progressing Within parameters on current gtt's Goal: Ability to maintain an adequate cardiac output will improve Outcome: Progressing Within parameters

## 2016-08-28 ENCOUNTER — Encounter (HOSPITAL_COMMUNITY): Payer: Self-pay | Admitting: Thoracic Surgery (Cardiothoracic Vascular Surgery)

## 2016-08-28 DIAGNOSIS — I255 Ischemic cardiomyopathy: Secondary | ICD-10-CM

## 2016-08-28 LAB — TYPE AND SCREEN
ABO/RH(D): A POS
ANTIBODY SCREEN: NEGATIVE
UNIT DIVISION: 0
UNIT DIVISION: 0
Unit division: 0
Unit division: 0
Unit division: 0
Unit division: 0

## 2016-08-28 LAB — BPAM RBC
BLOOD PRODUCT EXPIRATION DATE: 201809172359
BLOOD PRODUCT EXPIRATION DATE: 201809192359
Blood Product Expiration Date: 201809172359
Blood Product Expiration Date: 201809172359
Blood Product Expiration Date: 201809192359
Blood Product Expiration Date: 201809192359
ISSUE DATE / TIME: 201808240836
ISSUE DATE / TIME: 201808240836
ISSUE DATE / TIME: 201808240955
ISSUE DATE / TIME: 201808240955
UNIT TYPE AND RH: 6200
Unit Type and Rh: 6200
Unit Type and Rh: 6200
Unit Type and Rh: 6200
Unit Type and Rh: 6200
Unit Type and Rh: 6200

## 2016-08-28 LAB — BASIC METABOLIC PANEL
Anion gap: 9 (ref 5–15)
BUN: 10 mg/dL (ref 6–20)
CHLORIDE: 99 mmol/L — AB (ref 101–111)
CO2: 28 mmol/L (ref 22–32)
CREATININE: 0.69 mg/dL (ref 0.44–1.00)
Calcium: 8.2 mg/dL — ABNORMAL LOW (ref 8.9–10.3)
GFR calc Af Amer: >60 mL/min (ref >60)
GFR calc non Af Amer: >60 mL/min (ref >60)
GLUCOSE: 109 mg/dL — AB (ref 65–99)
Potassium: 3.3 mmol/L — ABNORMAL LOW (ref 3.5–5.1)
Sodium: 136 mmol/L (ref 135–145)

## 2016-08-28 LAB — GLUCOSE, CAPILLARY
GLUCOSE-CAPILLARY: 124 mg/dL — AB (ref 65–99)
GLUCOSE-CAPILLARY: 92 mg/dL (ref 65–99)
Glucose-Capillary: 83 mg/dL (ref 65–99)
Glucose-Capillary: 85 mg/dL (ref 65–99)
Glucose-Capillary: 107 mg/dL — ABNORMAL HIGH (ref 65–99)

## 2016-08-28 LAB — COOXEMETRY PANEL
CARBOXYHEMOGLOBIN: 1.7 % — AB (ref 0.5–1.5)
METHEMOGLOBIN: 0.6 % (ref 0.0–1.5)
O2 SAT: 74.4 %
TOTAL HEMOGLOBIN: 8 g/dL — AB (ref 12.0–16.0)

## 2016-08-28 LAB — PROTIME-INR
INR: 1.31
PROTHROMBIN TIME: 16.4 s — AB (ref 11.4–15.2)

## 2016-08-28 LAB — CBC
HEMATOCRIT: 25.7 % — AB (ref 36.0–46.0)
HEMOGLOBIN: 8.3 g/dL — AB (ref 12.0–15.0)
MCH: 27.7 pg (ref 26.0–34.0)
MCHC: 32.3 g/dL (ref 30.0–36.0)
MCV: 85.7 fL (ref 78.0–100.0)
Platelets: 141 10*3/uL — ABNORMAL LOW (ref 150–400)
RBC: 3 MIL/uL — AB (ref 3.87–5.11)
RDW: 14.3 % (ref 11.5–15.5)
WBC: 10 10*3/uL (ref 4.0–10.5)

## 2016-08-28 MED ORDER — ENOXAPARIN SODIUM 30 MG/0.3ML ~~LOC~~ SOLN
30.0000 mg | SUBCUTANEOUS | Status: DC
  Administered 2016-08-29: 30 mg via SUBCUTANEOUS
  Filled 2016-08-28: qty 0.3

## 2016-08-28 MED ORDER — CLOPIDOGREL BISULFATE 75 MG PO TABS
75.0000 mg | ORAL_TABLET | Freq: Every day | ORAL | Status: DC
  Administered 2016-08-29 – 2016-08-31 (×3): 75 mg via ORAL
  Filled 2016-08-28 (×3): qty 1

## 2016-08-28 MED ORDER — POTASSIUM CHLORIDE 10 MEQ/50ML IV SOLN
10.0000 meq | INTRAVENOUS | Status: AC
Start: 2016-08-28 — End: 2016-08-28
  Administered 2016-08-28 (×2): 10 meq via INTRAVENOUS
  Filled 2016-08-28 (×2): qty 50

## 2016-08-28 MED ORDER — FUROSEMIDE 40 MG PO TABS
40.0000 mg | ORAL_TABLET | Freq: Two times a day (BID) | ORAL | Status: DC
  Administered 2016-08-28 – 2016-08-31 (×7): 40 mg via ORAL
  Filled 2016-08-28 (×8): qty 1

## 2016-08-28 MED ORDER — POTASSIUM CHLORIDE 10 MEQ/50ML IV SOLN
10.0000 meq | INTRAVENOUS | Status: AC
  Administered 2016-08-28 (×3): 10 meq via INTRAVENOUS
  Filled 2016-08-28 (×3): qty 50

## 2016-08-28 MED ORDER — POTASSIUM CHLORIDE 10 MEQ/50ML IV SOLN: 10.0000 meq | INTRAVENOUS | Status: DC

## 2016-08-28 MED ORDER — FUROSEMIDE 40 MG PO TABS: 40.0000 mg | ORAL_TABLET | Freq: Two times a day (BID) | ORAL | Status: DC

## 2016-08-28 MED ORDER — POTASSIUM CHLORIDE CRYS ER 20 MEQ PO TBCR
20.0000 meq | EXTENDED_RELEASE_TABLET | Freq: Two times a day (BID) | ORAL | Status: DC
  Administered 2016-08-29 – 2016-08-31 (×5): 20 meq via ORAL
  Filled 2016-08-28 (×6): qty 1

## 2016-08-28 MED ORDER — LISINOPRIL 10 MG PO TABS
10.0000 mg | ORAL_TABLET | Freq: Every day | ORAL | Status: DC
  Administered 2016-08-28 – 2016-08-30 (×3): 10 mg via ORAL
  Filled 2016-08-28 (×3): qty 1

## 2016-08-28 MED ORDER — POTASSIUM CHLORIDE CRYS ER 20 MEQ PO TBCR
20.0000 meq | EXTENDED_RELEASE_TABLET | Freq: Two times a day (BID) | ORAL | Status: DC
Start: 2016-08-29 — End: 2016-08-28

## 2016-08-28 MED ORDER — ASPIRIN EC 81 MG PO TBEC: 81.0000 mg | DELAYED_RELEASE_TABLET | Freq: Every day | ORAL | Status: DC

## 2016-08-28 MED FILL — Sodium Bicarbonate IV Soln 8.4%: INTRAVENOUS | Qty: 50 | Status: AC

## 2016-08-28 MED FILL — Electrolyte-R (PH 7.4) Solution: INTRAVENOUS | Qty: 3000 | Status: AC

## 2016-08-28 MED FILL — Mannitol IV Soln 20%: INTRAVENOUS | Qty: 500 | Status: AC

## 2016-08-28 MED FILL — Sodium Chloride IV Soln 0.9%: INTRAVENOUS | Qty: 2000 | Status: AC

## 2016-08-28 NOTE — Progress Notes (Signed)
Pt arrived to 4e from 2h. Pt oriented to room and staff. Vitals stable. Pt denies needs at this time. Will continue current plan of care.   Berdine Dance BSN, RN

## 2016-08-28 NOTE — Progress Notes (Addendum)
Progress Note  Patient Name: Lindsay Camacho Date of Encounter: 08/28/2016  Primary Cardiologist: Dr Eldridge Dace  Subjective   Mild dyspnea, chest sore  Inpatient Medications    Scheduled Meds: . acetaminophen  1,000 mg Oral Q6H  . aspirin EC  325 mg Oral Daily  . atorvastatin  80 mg Oral Daily  . bisacodyl  10 mg Oral Daily   Or  . bisacodyl  10 mg Rectal Daily  . carvedilol  6.25 mg Oral BID WC  . Chlorhexidine Gluconate Cloth  6 each Topical Daily  . docusate sodium  200 mg Oral Daily  . enoxaparin (LOVENOX) injection  30 mg Subcutaneous Q24H  . insulin aspart  0-24 Units Subcutaneous TID AC & HS  . mouth rinse  15 mL Mouth Rinse QID  . metoCLOPramide (REGLAN) injection  10 mg Intravenous Q6H  . pantoprazole  40 mg Oral Daily  . sodium chloride flush  10-40 mL Intracatheter Q12H  . sodium chloride flush  3 mL Intravenous Q12H  . sodium chloride flush  3 mL Intravenous Q12H  . warfarin  2 mg Oral q1800  . Warfarin - Physician Dosing Inpatient   Does not apply q1800   Continuous Infusions: . sodium chloride    . sodium chloride    . lactated ringers 20 mL/hr at 08/27/16 1900  . lactated ringers 20 mL/hr at 08/26/16 1500  . potassium chloride 10 mEq (08/28/16 0652)   PRN Meds: sodium chloride, metoprolol tartrate, morphine injection, ondansetron (ZOFRAN) IV, oxyCODONE, sodium chloride flush, sodium chloride flush, sodium chloride flush, traMADol   Vital Signs    Vitals:   08/28/16 0400 08/28/16 0405 08/28/16 0500 08/28/16 0600  BP: 134/86  129/74 132/80  Pulse: 92  81 85  Resp: 20  (!) 9 (!) 8  Temp:  (!) 97.2 F (36.2 C)    TempSrc:  Axillary    SpO2: 96%  94% 96%  Weight:   61.1 kg (134 lb 11.2 oz)   Height:        Intake/Output Summary (Last 24 hours) at 08/28/16 0726 Last data filed at 08/28/16 4975  Gross per 24 hour  Intake          1165.69 ml  Output             3115 ml  Net         -1949.31 ml   Filed Weights   08/26/16 0447 08/27/16 0500  08/28/16 0500  Weight: 65.1 kg (143 lb 8.3 oz) 63.9 kg (140 lb 14 oz) 61.1 kg (134 lb 11.2 oz)    Telemetry    Atrial paced and sinus - Personally Reviewed   Physical Exam   GEN: No acute distress.   Neck: No JVD Cardiac: RRR, no murmurs, rubs, or gallops.  Respiratory: Clear to auscultation bilaterally. GI: Soft, nontender, non-distended  MS: No edema; No deformity. Neuro:  Nonfocal  Psych: Normal affect   Labs    Chemistry Recent Labs Lab 08/24/16 0241  08/26/16 0425 08/26/16 1524 08/26/16 1532 08/27/16 0404 08/28/16 0359  NA 135  < > 137  --  137 135 136  K 4.0  < > 3.9  --  4.0 3.2* 3.3*  CL 100*  < > 108  --  101 101 99*  CO2 28  < > 24  --   --  27 28  GLUCOSE 155*  < > 103*  --  108* 124* 109*  BUN 11  < > 7  --  9 8 10   CREATININE 0.84  < > 0.69 0.65 0.50 0.75 0.69  CALCIUM 9.0  < > 7.2*  --   --  8.1* 8.2*  PROT 7.1  --   --   --   --   --   --   ALBUMIN 3.3*  --   --   --   --   --   --   AST 30  --   --   --   --   --   --   ALT 30  --   --   --   --   --   --   ALKPHOS 70  --   --   --   --   --   --   BILITOT 0.9  --   --   --   --   --   --   GFRNONAA >60  < > >60 >60  --  >60 >60  GFRAA >60  < > >60 >60  --  >60 >60  ANIONGAP 7  < > 5  --   --  7 9  < > = values in this interval not displayed.   Hematology Recent Labs Lab 08/26/16 0425 08/26/16 1524 08/26/16 1532 08/27/16 0404  WBC 8.9 8.5  --  8.9  RBC 3.05* 2.91*  --  3.05*  HGB 8.6* 8.1* 7.8* 8.6*  HCT 26.0* 24.6* 23.0* 26.1*  MCV 85.2 84.5  --  85.6  MCH 28.2 27.8  --  28.2  MCHC 33.1 32.9  --  33.0  RDW 14.8 14.7  --  14.8  PLT 112* 80*  --  93*    Radiology    Dg Chest Port 1 View  Result Date: 08/27/2016 CLINICAL DATA:  Status post mitral valve replacement for severe mitral regurgitation. EXAM: PORTABLE CHEST 1 VIEW COMPARISON:  08/26/2016. FINDINGS: Cardiomegaly is noted. Mitral valve prosthesis is noted. Swan-Ganz catheter has been removed. RIGHT chest tube in good  position. RIGHT-sided pulmonary opacity is improving. No pneumothorax. IMPRESSION: Improved chest radiograph.  Satisfactory postop MVR. Electronically Signed   By: Elsie Stain M.D.   On: 08/27/2016 07:31    Patient Profile     51 y.o. female with ischemic CM and severe MR; now s/p MV repair  Assessment & Plan    1 status post mitral valve repair-patient doing well postoperatively. She remains in sinus rhythm. Will arrange outpatient baseline echocardiogram status post mitral valve repair following discharge. Continue coumadin.  2 coronary artery disease-given need for coumadin, DC asa and add plavix 75 mg daily; in 3-6 months can DC plavix and coumadin and resume ASA; continue statin.  3 ischemic cardiomyopathy-continue carvedilol. Would add low-dose ARB at discharge. Echocardiogram in 3 months. If ejection fraction less than 35% would require ICD.   4 hyperlipidemia-continue statin.  5 postoperative volume excess-patient appears to be euvolemic at present. Would change diuretic to oral.  Signed, Olga Millers, MD  08/28/2016, 7:26 AM

## 2016-08-28 NOTE — Progress Notes (Signed)
      [address]       [name] [address]             [phone]        CARDIOTHORACIC SURGERY PROGRESS NOTE   R3 Days Post-Op Procedure(s) (LRB): MINIMALLY INVASIVE MITRAL VALVE REPAIR (Right) TRANSESOPHAGEAL ECHOCARDIOGRAM (TEE) (N/A)  Subjective: Had a good night.  No SOB.  Still some soreness in the chest.  Objective: Vital signs: BP Readings from Last 1 Encounters:  08/28/16 132/80   Pulse Readings from Last 1 Encounters:  08/28/16 85   Resp Readings from Last 1 Encounters:  08/28/16 (!) 8   Temp Readings from Last 1 Encounters:  08/28/16 (!) 97.2 F (36.2 C) (Axillary)    Hemodynamics:   Mixed venous co-ox 74%   Physical Exam:  Rhythm:   sinus  Breath sounds: clear  Heart sounds:  RRR w/out murmur  Incisions:  Dressings dry, intact  Abdomen:  Soft, non-distended, non-tender  Extremities:  Warm, well-perfused  Chest tubes:  decreasing volume thin serosanguinous output, no air leak    Intake/Output from previous day: 08/26 0701 - 08/27 0700 In: 1165.7 [P.O.:450; I.V.:615.7; IV Piggyback:100] Out: 3115 [Urine:2875; Chest Tube:240] Intake/Output this shift: No intake/output data recorded.  Lab Results:  CBC: Recent Labs  08/26/16 1524 08/26/16 1532 08/27/16 0404  WBC 8.5  --  8.9  HGB 8.1* 7.8* 8.6*  HCT 24.6* 23.0* 26.1*  PLT 80*  --  93*    BMET:  Recent Labs  08/27/16 0404 08/28/16 0359  NA 135 136  K 3.2* 3.3*  CL 101 99*  CO2 27 28  GLUCOSE 124* 109*  BUN 8 10  CREATININE 0.75 0.69  CALCIUM 8.1* 8.2*     PT/INR:   Recent Labs  08/28/16 0359  LABPROT 16.4*  INR 1.31    CBG (last 3)   Recent Labs  08/27/16 1606 08/27/16 2012 08/27/16 2137  GLUCAP 113* 115* 108*    ABG    Component Value Date/Time   PHART 7.355 08/26/2016 0413   PCO2ART 44.3 08/26/2016 0413   PO2ART 121.0 (H) 08/26/2016 0413   HCO3 24.5 08/26/2016 0413   TCO2 26 08/26/2016 1532   ACIDBASEDEF 1.0 08/26/2016 0413   O2SAT 74.4 08/28/2016 0420    CXR: PORTABLE CHEST 1 VIEW  COMPARISON:  08/26/2016.  FINDINGS: Cardiomegaly is noted. Mitral valve prosthesis is noted. Swan-Ganz catheter has been removed. RIGHT chest tube in good position. RIGHT-sided pulmonary opacity is improving. No pneumothorax.  IMPRESSION: Improved chest radiograph.  Satisfactory postop MVR.   Electronically Signed   By: Elsie Stain M.D.   On: 08/27/2016 07:31    Assessment/Plan: S/P Procedure(s) (LRB): MINIMALLY INVASIVE MITRAL VALVE REPAIR (Right) TRANSESOPHAGEAL ECHOCARDIOGRAM (TEE) (N/A)  Overall doing well POD3 Maintaining NSR w/ stable BP off drips Breathing comfortably w/ O2 sats 94-96% on room air Expected post op acute blood loss anemia w/ baseline anemia preop, Hgb 8.6 yesterday Chronic systolic CHF with expected post-op volume excess, diuresing well and weight down 6 lbs but still reportedly up 4 lbs > preop Expected post op atelectasis, mild, R>L Post op thrombocytopenia, mild, platelet count 93k yesterday Severe PVD w/ left subclavian stenosis, aortoiliac occlusive disease Hypokalemia, induced by loop diuretics   Mobilize  Diuresis  Pulm toilet  Supplement potassium  D/C pacing wires D/C tubes later today or tomorrow, depending on output  Coumadin  Transfer step down  Purcell Nails, MD 08/28/2016 7:31 AM

## 2016-08-29 ENCOUNTER — Inpatient Hospital Stay (HOSPITAL_COMMUNITY): Payer: Self-pay

## 2016-08-29 LAB — BASIC METABOLIC PANEL
ANION GAP: 10 (ref 5–15)
BUN: 14 mg/dL (ref 6–20)
CALCIUM: 8.4 mg/dL — AB (ref 8.9–10.3)
CO2: 29 mmol/L (ref 22–32)
Chloride: 99 mmol/L — ABNORMAL LOW (ref 101–111)
Creatinine, Ser: 0.93 mg/dL (ref 0.44–1.00)
Glucose, Bld: 114 mg/dL — ABNORMAL HIGH (ref 65–99)
POTASSIUM: 3.6 mmol/L (ref 3.5–5.1)
Sodium: 138 mmol/L (ref 135–145)

## 2016-08-29 LAB — CBC
HEMATOCRIT: 28.3 % — AB (ref 36.0–46.0)
HEMOGLOBIN: 9.3 g/dL — AB (ref 12.0–15.0)
MCH: 28.5 pg (ref 26.0–34.0)
MCHC: 32.9 g/dL (ref 30.0–36.0)
MCV: 86.8 fL (ref 78.0–100.0)
Platelets: 233 10*3/uL (ref 150–400)
RBC: 3.26 MIL/uL — AB (ref 3.87–5.11)
RDW: 14.8 % (ref 11.5–15.5)
WBC: 10.4 10*3/uL (ref 4.0–10.5)

## 2016-08-29 LAB — GLUCOSE, CAPILLARY
GLUCOSE-CAPILLARY: 92 mg/dL (ref 65–99)
GLUCOSE-CAPILLARY: 110 mg/dL — AB (ref 65–99)
Glucose-Capillary: 86 mg/dL (ref 65–99)
Glucose-Capillary: 87 mg/dL (ref 65–99)

## 2016-08-29 LAB — PROTIME-INR
INR: 1.89
PROTHROMBIN TIME: 21.9 s — AB (ref 11.4–15.2)

## 2016-08-29 MED ORDER — WARFARIN SODIUM 1 MG PO TABS
1.0000 mg | ORAL_TABLET | Freq: Every day | ORAL | Status: DC
  Administered 2016-08-29 – 2016-08-30 (×2): 1 mg via ORAL
  Filled 2016-08-29 (×2): qty 1

## 2016-08-29 MED ORDER — POTASSIUM CHLORIDE CRYS ER 20 MEQ PO TBCR
40.0000 meq | EXTENDED_RELEASE_TABLET | Freq: Once | ORAL | Status: AC
  Administered 2016-08-29: 40 meq via ORAL
  Filled 2016-08-29: qty 2

## 2016-08-29 NOTE — Progress Notes (Signed)
Removed chest tubes and on Q. No issues. Will continue to monitor.  Hanna Ra, Charlaine Dalton RN

## 2016-08-29 NOTE — Progress Notes (Addendum)
      [address]       [name] [address]             [phone]        4 Days Post-Op Procedure(s) (LRB): MINIMALLY INVASIVE MITRAL VALVE REPAIR (Right) TRANSESOPHAGEAL ECHOCARDIOGRAM (TEE) (N/A)  Subjective: Patient with incisional, chest tube pain. She also has intermittent nausea but no recent emesis. She has had a bowel movement.  Objective: Vital signs in last 24 hours: Temp:  [97 F (36.1 C)-98.6 F (37 C)] 97.8 F (36.6 C) (08/28 0409) Pulse Rate:  [65-76] 69 (08/28 0600) Cardiac Rhythm: Normal sinus rhythm;Bundle branch block (08/27 2130) Resp:  [16-29] 17 (08/28 0600) BP: (87-117)/(57-75) 117/57 (08/28 0409) SpO2:  [97 %-100 %] 97 % (08/28 0600) Weight:  [133 lb (60.3 kg)] 133 lb (60.3 kg) (08/28 0409)  Pre op weight 58.3 kg Current Weight  08/29/16 133 lb (60.3 kg)      Intake/Output from previous day: 08/27 0701 - 08/28 0700 In: 360 [P.O.:30; I.V.:130; IV Piggyback:200] Out: 1150 [Urine:950; Chest Tube:200]   Physical Exam:  Cardiovascular: RRR, no murmur. Pulmonary: Decreased on the right and mostly clear on the left Abdomen: Soft, non tender, bowel sounds present. Extremities: SCDs in place Wounds: Dressing is clean and dry.   Chest tubes: to suction, no air leak  Lab Results: CBC: Recent Labs  08/28/16 1227 08/29/16 0423  WBC 10.0 10.4  HGB 8.3* 9.3*  HCT 25.7* 28.3*  PLT 141* 233   BMET:  Recent Labs  08/28/16 0359 08/29/16 0423  NA 136 138  K 3.3* 3.6  CL 99* 99*  CO2 28 29  GLUCOSE 109* 114*  BUN 10 14  CREATININE 0.69 0.93  CALCIUM 8.2* 8.4*    PT/INR:  Lab Results  Component Value Date   INR 1.89 08/29/2016   INR 1.31 08/28/2016   INR 1.41 08/27/2016   ABG:  INR: Will add last result for INR, ABG once components are confirmed Will add last 4 CBG results once components are confirmed  Assessment/Plan:  1. CV - SR in the 70's. On Coreg 6.25 mg bid, Plavix 75 mg daily, Lisinopril 10 mg  daily, and Coumadin. INR increased from 1.31 to 1.89 with Coumadin 2 mg. Will decrease Coumadin to 1 mg.  2.  Pulmonary - Chest tubes with 200 cc of output last 24 hours. CXR ordered for today but not taken yet. Will discuss with Dr. Cornelius Moras but hope to remove. Check CXR in am. On room air. Encourage incentive spirometer. 3. Chronic systolic CHF,volume overload- On Lasix 40 mg bid 4.  Acute blood loss anemia - H and H stable at 9.3 and 28.3 5. Supplement potassium 6. CBGs 85/107/92. Pre op HGA1C 4.7. Stop accu checks and SS PRN. 7. Zofran for nausea. No abdominal pain. ContinueReglan and monitor  ZIMMERMAN,DONIELLE MPA-C 08/29/2016,7:38 AM   I have seen and examined the patient and agree with the assessment and plan as outlined.  D/C chest tubes.  Purcell Nails, MD 08/29/2016 1:28 PM

## 2016-08-29 NOTE — Discharge Instructions (Signed)
Activity: 1.May walk up steps                2.No lifting more than ten pounds for two weeks.                 3.No driving for two weeks.                4.Stop any activity that causes chest pain, shortness of breath, dizziness, sweating or excessive weakness.                5.Avoid straining.                6.Continue with your breathing exercises daily.  Diet: Low fat, Low salt diet  Wound Care: May shower.  Clean wounds with mild soap and water daily. Contact the office at [phone] if any problems arise.  MOUTH CARE AFTER SURGERY  FACTS:  Ice used in ice bag helps keep the swelling down, and can help lessen the pain.  It is easier to treat pain BEFORE it happens.  Spitting disturbs the clot and may cause bleeding to start again, or to get worse.  Smoking delays healing and can cause complications.  Sharing prescriptions can be dangerous.  Do not take medications not recently prescribed for you.  Antibiotics may stop birth control pills from working.  Use other means of birth control while on antibiotics.  Warm salt water rinses after the first 24 hours will help lessen the swelling:  Use 1/2 teaspoonful of table salt per oz.of water.  DO NOT:  Do not spit.  Do not drink through a straw.  Strongly advised not to smoke, dip snuff or chew tobacco at least for 3 days.  Do not eat sharp or crunchy foods.  Avoid the area of surgery when chewing.  Do not stop your antibiotics before your instructions say to do so.  Do not eat hot foods until bleeding has stopped.  If you need to, let your food cool down to room temperature.  EXPECT:  Some swelling, especially first 2-3 days.  Soreness or discomfort in varying degrees.  Follow your dentist's instructions about how to handle pain before it starts.  Pinkish saliva or light blood in saliva, or on your pillow in the morning.  This can last around 24 hours.  Bruising inside or outside the mouth.  This may not show up  until 2-3 days after surgery.  Don't worry, it will go away in time.  Pieces of "bone" may work themselves loose.  It's OK.  If they bother you, let us know.  WHAT TO DO IMMEDIATELY AFTER SURGERY:  Bite on the gauze with steady pressure for 1-2 hours.  Don't chew on the gauze.  Do not lie down flat.  Raise your head support especially for the first 24 hours.  Apply ice to your face on the side of the surgery.  You may apply it 20 minutes on and a few minutes off.  Ice for 8-12 hours.  You may use ice up to 24 hours.  Before the numbness wears off, take a pain pill as instructed.  Prescription pain medication is not always required.  SWELLING:  Expect swelling for the first couple of days.  It should get better after that.  If swelling increases 3 days or so after surgery; let us know as soon as possible.  FEVER:  Take Tylenol every 4 hours if needed to lower your temperature, especially if it is at  100F or higher.  Drink lots of fluids.  If the fever does not go away, let us know.  BREATHING TROUBLE:  Any unusual difficulty breathing means you have to have someone bring you to the emergency room ASAP  BLEEDING:  Light oozing is expected for 24 hours or so.  Prop head up with pillows  Avoid spitting  Do not confuse bright red fresh flowing blood with lots of saliva colored with a little bit of blood.  If you notice some bleeding, place gauze or a tea bag where it is bleeding and apply CONSTANT pressure by biting down for 1 hour.  Avoid talking during this time.  Do not remove the gauze or tea bag during this hour to "check" the bleeding.  If you notice bright RED bleeding FLOWING out of particular area, and filling the floor of your mouth, put a wad of gauze on that area, bite down firmly and constantly.  Call us immediately.  If we're closed, have someone bring you to the emergency room.  ORAL HYGIENE:  Brush your teeth as usual after meals and before bedtime.  Use  a soft toothbrush around the area of surgery.  DO NOT AVOID BRUSHING.  Otherwise bacteria(germs) will grow and may delay healing or encourage infection.  Since you cannot spit, just gently rinse and let the water flow out of your mouth.  DO NOT SWISH HARD.  EATING:  Cool liquids are a good point to start.  Increase to soft foods as tolerated.  PRESCRIPTIONS:  Follow the directions for your prescriptions exactly as written.  If Dr. Kristin Bruins gave you a narcotic pain medication, do not drive, operate machinery or drink alcohol when on that medication.  QUESTIONS:  Call our office during office hours [phone] or call the Emergency Room at [phone].

## 2016-08-29 NOTE — Plan of Care (Signed)
Problem: Activity: Goal: Risk for activity intolerance will decrease Outcome: Not Progressing Patient not tolerating activity.

## 2016-08-29 NOTE — Discharge Summary (Signed)
Physician Discharge Summary       [address] [place].[address]       [name] [address]             [phone]    Patient ID: Lindsay Camacho MRN: [id number] DOB/AGE: 51-Jul-1967 51 y.o.  Admit date: 08/17/2016 Discharge date: 08/31/2016  Admission Diagnoses: Severe mitral regurgitation  Active Diagnoses:  1. HTN (hypertension) 2. LBBB (left bundle branch block) 3. CAD - CFX DES 01/17/13 4. Cardiomyopathy, ischemic 5. Anemia due to blood loss 6. Chronic combined systolic (congestive) and diastolic (congestive) heart failure (HCC) 7. Stenosis of left subclavian artery (HCC) 8. Aortoiliac occlusive disease (HCC) 9. PVD (peripheral vascular disease) (HCC) 10. Stroke (HCC) 11. HLD (hyperlipidemia) 12. Tobacco abuse  Consult: Dr. Kristin Bruins  Procedure (s):   Minimally-Invasive Mitral Valve Repair             Placement of left femoral arterial line             Right axillary artery cannulation for cardiopulmonary bypass             Edwards McArthy-Adams IMR ETLogix Ring Annuloplasty (size 26mm, model # 4100, serial # [id number])             Placement of On-Q pain management catheter by Dr. Cornelius Moras on 08/25/2016.  History of Presenting Illness: Patient is a 51 year old female with history of coronary artery disease status post acute lateral wall myocardial infarction in January 2015, mitral regurgitation, chronic systolic congestive heart failure, left bundle branch block, hypertension, peripheral vascular disease, and hyperlipidemia who has been referred for surgical consultation to discuss treatment options for management of severe symptomatic mitral regurgitation with recurrent episodes of acute exacerbation of chronic congestive heart failure. The patient's cardiac history dates back to January 2015 when she presented acutely with a lateral wall myocardial infarction. She was treated with PCI and stenting of the left circumflex coronary artery using a drug-eluting stent.  She has been  followed regularly by Dr. Eldridge Dace and reports that she has had significant exertional shortness of breath ever since.  Echocardiograms over the last 2 years have demonstrated the presence of moderate to severe left ventricular systolic dysfunction with at least moderate mitral regurgitation.  Follow-up catheterization performed in septic timbre of 2016 revealed patent stent and left circumflex coronary artery. She developed a retroperitoneal bleed requiring surgical repair of her right common femoral artery at that time. She has been hospitalized several times with acute exacerbation of congestive heart failure. She apparently had another catheterization in 2017 in [place], which again demonstrated patent stent in the left circumflex territory by report.  She was last seen by Dr. Eldridge Dace in April of this year at which time she was doing a little better on medical therapy, although she continued to complain of symptoms of significant exertional shortness of breath.  The patient states that she was hospitalized last month in [place] with another episode of acute shortness of breath. She was told that she had pneumonia at the time although she denies any cough or fevers, and she states that symptoms were marginally improved at the time of hospital discharge.  An echocardiogram was performed at that time but apparently not read until recently. She was told by Dr. Hyacinth Meeker that her mitral valve was leaking. She was hospitalized again several days ago with worsening shortness breath and orthopnea. She was transferred to [facility] for management.  Echocardiogram performed 08/17/2016 revealed at least moderate left ventricular  systolic dysfunction with ejection fraction estimated 35-40%. There was severe mitral regurgitation with flow reversal in the pulmonary veins. There is mild left atrial enlargement.  Transesophageal echocardiogram was performed earlier today and reported to demonstrate  moderate global left ventricular systolic dysfunction with mild aortic insufficiency, restricted posterior leaflet, and severe central mitral regurgitation. Cardiothoracic surgical consultation was requested.  The patient is single and lives with her aunt in [place], [place].  She has been out of work since her heart attack in 2015 and has apparently applied for disability.  She has been chronically limited by shortness of breath. She states that her breathing has not been right ever since her heart attack. She gets short of breath with mild exertion and frequently at rest. She cannot lay flat in bed. She reports episodes of PND. She has not had any exertional chest pain or chest tightness. She denies any palpitations. She has had some dizzy spells without syncope. She denies any productive cough but she has some wheezing with exertion. She quit smoking last November.  Dr. Cornelius Moras discussed matters at length with the patient in her family at the bedside. He discussed the indications, risks, and potential benefits of mitral valve repair or replacement. Alternative treatment strategies have been discussed including continued medical therapy without any type of surgical intervention, possibility of biventricular pacing, or surgical repair/replacement of the mitral valve. Pre operative carotid duplex showed no significant internal carotid artery stenosis bilaterally.   A dental consult was obtained with Dr. Kristin Bruins. 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 She remained afebrile and hemodynamically stable. She was initially AAI paced and weaned off Amiodarone, Neo Syneprhine, Milrinone, and Dopamine drips. [name], a line, and foley were removed early in the post operative course. Chest tubes remained for several days post op and then were removed on 08/29/2016. She was  started on Coumadin. Her PT and INR were monitored daily. Of late, she is on 1 mg of Coumadin and her latest INR is down to 2.04. She will be discharged on 1 mg of Coumadin and her PT/INR needs to be checked on Monday 09/04/2016.Coreg was later restarted and titrated accordingly. Her rhythm was SR, junctional so IV Amiodarone was stopped. She was restarted on Lisinopril for BP control. She was restarted on Plavix for severe PVD. She was volume over loaded and diuresed. She had ABL anemia.She did not require a post op transfusion. Last H and H was 9.3 and 28.3. She did have thrombocytopenia. Her platelet count went as low as 80,000 but over time this did resolve. Last platelet count was up to 233,000. She was weaned off the insulin drip.  The patient's HGA1C pre op was  4.7. The patient was felt surgically stable for transfer from the ICU to [facility] for further convalescence on 08/28/2016. She continues to progress with cardiac rehab. She was ambulating on room air. She has been tolerating a diet and has had a bowel movement. Epicardial pacing wires were removed on 08/28/2016. SBP has been more labile (not symptomatic) of late so as discussed with Dr. Cornelius Moras, I decreased her Lisinopril to 2.5 mg daily. Chest tube sutures will be removed in the office after discharge.The patient is felt surgically stable for discharge today.   Latest Vital Signs: Blood pressure 92/61, pulse 79, temperature (!) 97.1 F (36.2 C), temperature source Oral, resp. rate 14, height 5'  1" (1.549 m), weight 125 lb 8 oz (56.9 kg), SpO2 98 %.  Physical Exam: Cardiovascular: RRR, no murmur. Pulmonary: Decreased on the right and mostly clear on the left Abdomen: Soft, non tender, bowel sounds present. Extremities: SCDs in place Wounds: Clean and dry.  Discharge Condition:Stable and discharged to home.  Recent laboratory studies:  Lab Results  Component Value Date   WBC 9.8 08/31/2016   HGB 9.8 (L) 08/31/2016   HCT 31.8 (L)  08/31/2016   MCV 88.8 08/31/2016   PLT 358 08/31/2016   Lab Results  Component Value Date   NA 138 08/31/2016   K 4.3 08/31/2016   CL 99 (L) 08/31/2016   CO2 31 08/31/2016   CREATININE 0.87 08/31/2016   GLUCOSE 102 (H) 08/31/2016   Diagnostic Studies:  CLINICAL DATA:  Chest pain.  EXAM: CHEST  2 VIEW  COMPARISON:  Radiographs of August 29, 2016.  FINDINGS: Stable cardiomediastinal silhouette. Left lung is clear. Minimal right apical pneumothorax is noted. Right-sided chest tube has been removed. Stable density is seen in right midlung concerning for contusion or possibly atelectasis or infiltrate. Right basilar atelectasis is noted with minimal right pleural effusion. Right axillary surgical clips are noted. Status post cardiac valve repair.  IMPRESSION: Right-sided chest tube has been removed with minimal right apical pneumothorax present. Right midlung atelectasis, infiltrate or contusion is noted. Mild right basilar atelectasis is also noted with minimal right pleural effusion.   Electronically Signed   By: Lupita Raider, M.D.   On: 08/30/2016 08:21  Dg Orthopantogram  Result Date: 08/18/2016 CLINICAL DATA:  51 y/o  F; poor dentition. EXAM: ORTHOPANTOGRAM/PANORAMIC COMPARISON:  None. FINDINGS: There are several dental caries within the residual mandibular incisors, canines, and premolars. There is a periapical cyst associated with the right posterior most mandibular premolar. Residual left mandibular molar is absent the crown. IMPRESSION: Odontogenic disease with several dental caries and periapical cysts of the residual posterior most right mandibular tooth. Electronically Signed   By: Mitzi Hansen M.D.   On: 08/18/2016 20:48     Ct Angio Chest/abd/pel For Dissection W And/or W/wo  Result Date: 08/22/2016 CLINICAL DATA:  Preop for heart surgery. EXAM: CT ANGIOGRAPHY CHEST, ABDOMEN AND PELVIS TECHNIQUE: Multidetector CT imaging through the chest,  abdomen and pelvis was performed using the standard protocol during bolus administration of intravenous contrast. Multiplanar reconstructed images and MIPs were obtained and reviewed to evaluate the vascular anatomy. CONTRAST:  100 mL of Isovue 370 intravenously. COMPARISON:  CT scan of September 22, 2014. FINDINGS: CTA CHEST FINDINGS Cardiovascular: Atherosclerosis of thoracic aorta is noted without aneurysm or dissection. Mild eccentric plaque is noted involving the proximal portion of the right innominate artery. Left common carotid artery is widely patent without stenosis. Severe stenosis is noted in the proximal left subclavian artery. Mild cardiomegaly is noted without pericardial effusion. Mediastinum/Nodes: No enlarged mediastinal, hilar, or axillary lymph nodes. Thyroid gland, trachea, and esophagus demonstrate no significant findings. Lungs/Pleura: Lungs are clear. No pleural effusion or pneumothorax. Musculoskeletal: No chest wall abnormality. No acute or significant osseous findings. Review of the MIP images confirms the above findings. CTA ABDOMEN AND PELVIS FINDINGS VASCULAR Aorta: Atherosclerosis of abdominal aorta is noted without aneurysm or dissection. Celiac: Patent without evidence of aneurysm, dissection, vasculitis or significant stenosis. SMA: Shares common origin with celiac artery. No significant stenosis of thrombus is noted. Renals: Both renal arteries are patent without evidence of aneurysm, dissection, vasculitis, fibromuscular dysplasia or significant stenosis. IMA: Patent without evidence  of aneurysm, dissection, vasculitis or significant stenosis. Inflow: Extensive atherosclerosis is noted moderate focal stenosis is noted at the origin of the left common iliac artery. Left external iliac artery is widely patent. Long length eccentric plaque is noted in the right common iliac artery which results in moderate stenosis. Right external iliac artery is widely patent. Veins: No obvious  venous abnormality within the limitations of this arterial phase study. Review of the MIP images confirms the above findings. NON-VASCULAR Hepatobiliary: No focal liver abnormality is seen. No gallstones, gallbladder wall thickening, or biliary dilatation. Pancreas: Unremarkable. No pancreatic ductal dilatation or surrounding inflammatory changes. Spleen: Normal in size without focal abnormality. Adrenals/Urinary Tract: Adrenal glands are unremarkable. Kidneys are normal, without renal calculi, focal lesion, or hydronephrosis. Bladder is unremarkable. Stomach/Bowel: There is no evidence of bowel obstruction or inflammation. The stomach appears normal. The appendix is not identified. Lymphatic: No significant adenopathy is noted. Reproductive: Uterus and bilateral adnexa are unremarkable. Other: Minimal amount of free fluid is noted in the posterior pelvis which most likely is physiologic. Musculoskeletal: No acute or significant osseous findings. Review of the MIP images confirms the above findings. IMPRESSION: Atherosclerosis of thoracic and abdominal aorta is noted without aneurysm or dissection. Severe stenosis is seen involving the proximal left subclavian artery. No significant mesenteric or renal artery stenosis is noted. Long length eccentric plaque is noted in right common iliac artery resulting in moderate stenosis. Moderate focal stenosis is noted at the origin of the left common iliac artery secondary to calcified atheromatous disease. Electronically Signed   By: Lupita Raider, M.D.   On: 08/22/2016 20:26   Discharge Instructions    Amb Referral to Cardiac Rehabilitation    Complete by:  As directed    Referring to [facility] Phase 2   Diagnosis:  Valve Repair   Valve:  Mitral     Discharge Medications: Allergies as of 08/31/2016      Reactions   Brilinta [ticagrelor] Shortness Of Breath   Other Other (See Comments)   BP med (unsure of name) BP bottomed out   Dye Fdc Red [red Dye] Itching,  Other (See Comments)   Allergic to dye that is used in nuclear stress test. Pt sates makes her feel like her blood is boiling and itching   Penicillins Rash      Medication List    STOP taking these medications   aspirin 81 MG tablet   isosorbide mononitrate 30 MG 24 hr tablet Commonly known as:  IMDUR   nitroGLYCERIN 0.4 MG SL tablet Commonly known as:  NITROSTAT     TAKE these medications   acetaminophen 325 MG tablet Commonly known as:  TYLENOL Take 650 mg by mouth every 6 (six) hours as needed for moderate pain.   atorvastatin 80 MG tablet Commonly known as:  LIPITOR Take 1 tablet (80 mg total) by mouth daily.   carvedilol 6.25 MG tablet Commonly known as:  COREG Take 6.25 mg by mouth 2 (two) times daily with a meal.   clopidogrel 75 MG tablet Commonly known as:  PLAVIX Take 1 tablet (75 mg total) by mouth daily with breakfast.   docusate sodium 100 MG capsule Commonly known as:  COLACE Take 100 mg by mouth 2 (two) times daily as needed for mild constipation.   furosemide 20 MG tablet Commonly known as:  LASIX Take 1 tablet (20 mg total) by mouth daily. What changed:  when to take this  reasons to take this   lisinopril  2.5 MG tablet Commonly known as:  PRINIVIL,ZESTRIL Take 1 tablet (2.5 mg total) by mouth daily. What changed:  medication strength  how much to take   oxyCODONE 5 MG immediate release tablet Commonly known as:  Oxy IR/ROXICODONE Take 5 mg by mouth every 4-6 hours PRN severe pain.   potassium chloride 10 MEQ tablet Commonly known as:  K-DUR Take 1 tablet (10 mEq total) by mouth daily.   warfarin 1 MG tablet Commonly known as:  COUMADIN Take 1 tablet (1 mg total) by mouth daily at 6 PM. Or as directed            Discharge Care Instructions        Start     Ordered   08/31/16 0000  Amb Referral to Cardiac Rehabilitation    Comments:  Referring to [facility] Phase 2  Question Answer Comment  Diagnosis: Valve Repair     Valve: Mitral      08/31/16 0946   08/31/16 0000  furosemide (LASIX) 20 MG tablet  Daily    Question:  Supervising Provider  Answer:  Purcell Nails   08/31/16 1142   08/31/16 0000  lisinopril (PRINIVIL,ZESTRIL) 2.5 MG tablet  Daily    Question:  Supervising Provider  Answer:  Purcell Nails   08/31/16 1142   08/31/16 0000  warfarin (COUMADIN) 1 MG tablet  Daily-1800    Question:  Supervising Provider  Answer:  Purcell Nails   08/31/16 1142   08/31/16 0000  oxyCODONE (OXY IR/ROXICODONE) 5 MG immediate release tablet    Question:  Supervising Provider  Answer:  Purcell Nails   08/31/16 1142     The patient has been discharged on:  1.Beta Blocker:  Yes [ x  ]                              No   [   ]                              If No, reason:  2.Ace Inhibitor/ARB: Yes [ x  ]                                     No  [    ]                                     If No, reason:  3.Statin:   Yes [ x  ]                  No  [   ]                  If No, reason:  4.Ecasa:  Yes  [   ]                  No   [ x  ]                  If No, reason: On Plavix and Coumadin  Follow Up Appointments: Follow-up Information    Call [name], DDS.   Specialty:  Dentistry Why:  For suture removal, For wound re-check Contact information: [address] [place] [address] [phone]        Purcell Nails, MD Follow up on 09/11/2016.   Specialty:  Cardiothoracic Surgery Why:  PA/LAT CXR to be taken (at [facility] which is in the same building as Dr. Orvan July office, on the ground floor) on 09/11/2016 at 3:30;Appointment time is at 4:00 pm  Contact information: [address] [employer] [address] [place] [address] [phone]        Allayne Butcher, PA-C Follow up.   Specialties:  Cardiology, Radiology Why:  Cardiology Hospital Follow-Up on 09/19/2016 at 11:00AM with Dr. Hoyle Barr Physican Assistant.  Contact information: [address] [place] [address] [phone]        [facility] [address] Office Follow up on 09/05/2016.   Specialty:  Cardiology Why:  Appointment is for PT and INR to be drawn (on Coumadin for mitral valve repair, INR goal 2-2.5). Appointment time is at 10:35 am Contact information: [address] [place] [address] [phone]          Signed: Doree Fudge MPA-C 08/31/2016, 12:45 PM

## 2016-08-29 NOTE — Progress Notes (Signed)
CARDIAC REHAB PHASE I   PRE:  Rate/Rhythm: 70 SR  BP:  Supine:   Sitting: 124/79  Standing:    SaO2: 99%RA  MODE:  Ambulation: 220 ft   POST:  Rate/Rhythm: 83 SR  BP:  Supine:   Sitting: 96/48  Standing:    SaO2: 99%RA 1002-1032 Pt not feeling well. Agreed to walk. Pt walked 220 ft on RA with rolling walker and asst x 1 with slow steady gait. BP lower after walk. Pt stated she felt a little weak and dizzy but in pain. To bed after walk and helped her get comfortable. Encouraged more walks today with staff.  Luetta Nutting, RN BSN  08/29/2016 10:31 AM

## 2016-08-30 ENCOUNTER — Inpatient Hospital Stay (HOSPITAL_COMMUNITY): Payer: Self-pay

## 2016-08-30 LAB — GLUCOSE, CAPILLARY
Glucose-Capillary: 84 mg/dL (ref 65–99)
Glucose-Capillary: 74 mg/dL (ref 65–99)
Glucose-Capillary: 83 mg/dL (ref 65–99)
Glucose-Capillary: 100 mg/dL — ABNORMAL HIGH (ref 65–99)

## 2016-08-30 LAB — PROTIME-INR
INR: 2.87
PROTHROMBIN TIME: 29.8 s — AB (ref 11.4–15.2)

## 2016-08-30 NOTE — Progress Notes (Signed)
Pt has not voided since 1910 denies need to void bladder scan shows only 330, will continue to monitor

## 2016-08-30 NOTE — Progress Notes (Addendum)
      [address]       [name] [address]             [phone]        5 Days Post-Op Procedure(s) (LRB): MINIMALLY INVASIVE MITRAL VALVE REPAIR (Right) TRANSESOPHAGEAL ECHOCARDIOGRAM (TEE) (N/A)  Subjective: She still has intermittent nausea but no recent emesis. She had urinary retention last night but was able to void this am (300 cc according to nurse).  Objective: Vital signs in last 24 hours: Temp:  [97.2 F (36.2 C)-97.9 F (36.6 C)] 97.2 F (36.2 C) (08/29 0400) Pulse Rate:  [69-78] 74 (08/29 0628) Cardiac Rhythm: Normal sinus rhythm (08/29 0400) Resp:  [15-22] 15 (08/29 0628) BP: (89-124)/(63-78) 124/78 (08/29 0400) SpO2:  [97 %-100 %] 97 % (08/29 0628) Weight:  [127 lb 3.2 oz (57.7 kg)-128 lb 14.4 oz (58.5 kg)] 127 lb 3.2 oz (57.7 kg) (08/29 0628)  Pre op weight 58.3 kg Current Weight  08/30/16 127 lb 3.2 oz (57.7 kg)      Intake/Output from previous day: 08/28 0701 - 08/29 0700 In: 570 [P.O.:570] Out: 470 [Urine:400; Chest Tube:70]   Physical Exam:  Cardiovascular: RRR, no murmur. Pulmonary: Decreased on the right base and mostly clear on the left Abdomen: Soft, non tender, bowel sounds present. Extremities: No LE edema Wounds: Large dressing is clean and dry.     Lab Results: CBC:  Recent Labs  08/28/16 1227 08/29/16 0423  WBC 10.0 10.4  HGB 8.3* 9.3*  HCT 25.7* 28.3*  PLT 141* 233   BMET:   Recent Labs  08/28/16 0359 08/29/16 0423  NA 136 138  K 3.3* 3.6  CL 99* 99*  CO2 28 29  GLUCOSE 109* 114*  BUN 10 14  CREATININE 0.69 0.93  CALCIUM 8.2* 8.4*    PT/INR:  Lab Results  Component Value Date   INR 2.87 08/30/2016   INR 1.89 08/29/2016   INR 1.31 08/28/2016   ABG:  INR: Will add last result for INR, ABG once components are confirmed Will add last 4 CBG results once components are confirmed  Assessment/Plan:  1. CV - SR in the 70's. On Coreg 6.25 mg bid, Plavix 75 mg daily, Lisinopril 10 mg  daily, and Coumadin. INR increased from 1.89 to 2.87. Will continue with Coumadin to 1 mg. Stop Lovenox. 2.  Pulmonary - Chest tubes removed yesterday. CXR this am ordered but not taken yet . On room air. Encourage incentive spirometer. 3. Chronic systolic CHF,volume overload- On Lasix 40 mg bid. May be able to decrease to daily. 4.  Acute blood loss anemia - H and H stable at 9.3 and 28.3 5. Possible discharge 1-2 days  ZIMMERMAN,DONIELLE MPA-C 08/30/2016,7:24 AM   I have seen and examined the patient and agree with the assessment and plan as outlined.  Purcell Nails, MD 08/30/2016 8:35 AM

## 2016-08-30 NOTE — Progress Notes (Signed)
CARDIAC REHAB PHASE I   PRE:  Rate/Rhythm: 73 SR    BP: sitting 99/62    SaO2: 99 RA  MODE:  Ambulation: 370 ft   POST:  Rate/Rhythm: 81 SR    BP: sitting 67/38, recheck 83/69     SaO2: 96 RA  Pt ready to walk. Slow, weak, but increased distance. Urinated before walk. No c/o walking however BP low after walk (see above). Denied dizziness on EOB. Notified RN. Encouraged more walking with staff. Has RW at home. [id number]   Harriet Masson CES, ACSM 08/30/2016 12:16 PM

## 2016-08-30 NOTE — Progress Notes (Signed)
Patient ambulated 330ft on room air using a walker , ambulation well tolerated will continue to monitor.

## 2016-08-31 LAB — COMPREHENSIVE METABOLIC PANEL
ALT: 15 U/L (ref 14–54)
ANION GAP: 8 (ref 5–15)
AST: 21 U/L (ref 15–41)
Albumin: 3 g/dL — ABNORMAL LOW (ref 3.5–5.0)
Alkaline Phosphatase: 60 U/L (ref 38–126)
BILIRUBIN TOTAL: 0.9 mg/dL (ref 0.3–1.2)
BUN: 10 mg/dL (ref 6–20)
CALCIUM: 8.7 mg/dL — AB (ref 8.9–10.3)
CO2: 31 mmol/L (ref 22–32)
Chloride: 99 mmol/L — ABNORMAL LOW (ref 101–111)
Creatinine, Ser: 0.87 mg/dL (ref 0.44–1.00)
Glucose, Bld: 102 mg/dL — ABNORMAL HIGH (ref 65–99)
POTASSIUM: 4.3 mmol/L (ref 3.5–5.1)
Sodium: 138 mmol/L (ref 135–145)
TOTAL PROTEIN: 6.2 g/dL — AB (ref 6.5–8.1)

## 2016-08-31 LAB — PROTIME-INR
INR: 2.04
Prothrombin Time: 22.9 seconds — ABNORMAL HIGH (ref 11.4–15.2)

## 2016-08-31 LAB — CBC
HEMATOCRIT: 31.8 % — AB (ref 36.0–46.0)
Hemoglobin: 9.8 g/dL — ABNORMAL LOW (ref 12.0–15.0)
MCH: 27.4 pg (ref 26.0–34.0)
MCHC: 30.8 g/dL (ref 30.0–36.0)
MCV: 88.8 fL (ref 78.0–100.0)
Platelets: 358 10*3/uL (ref 150–400)
RBC: 3.58 MIL/uL — AB (ref 3.87–5.11)
RDW: 14.8 % (ref 11.5–15.5)
WBC: 9.8 10*3/uL (ref 4.0–10.5)

## 2016-08-31 LAB — GLUCOSE, CAPILLARY: Glucose-Capillary: 96 mg/dL (ref 65–99)

## 2016-08-31 LAB — PREALBUMIN: PREALBUMIN: 15.4 mg/dL — AB (ref 18–38)

## 2016-08-31 LAB — BRAIN NATRIURETIC PEPTIDE: B Natriuretic Peptide: 1085.4 pg/mL — ABNORMAL HIGH (ref 0.0–100.0)

## 2016-08-31 MED ORDER — LISINOPRIL 2.5 MG PO TABS
2.5000 mg | ORAL_TABLET | Freq: Every day | ORAL | Status: DC
  Filled 2016-08-31: qty 1

## 2016-08-31 MED ORDER — FUROSEMIDE 20 MG PO TABS: 20.0000 mg | ORAL_TABLET | Freq: Every day | ORAL | 1 refills | Status: DC

## 2016-08-31 MED ORDER — FUROSEMIDE 40 MG PO TABS: 40.0000 mg | ORAL_TABLET | Freq: Every day | ORAL | Status: DC

## 2016-08-31 MED ORDER — WARFARIN SODIUM 1 MG PO TABS: 1.0000 mg | ORAL_TABLET | Freq: Every day | ORAL | 1 refills | Status: DC

## 2016-08-31 MED ORDER — OXYCODONE HCL 5 MG PO TABS: ORAL_TABLET | ORAL | 0 refills | Status: DC

## 2016-08-31 MED ORDER — LISINOPRIL 2.5 MG PO TABS: 2.5000 mg | ORAL_TABLET | Freq: Every day | ORAL | 1 refills | Status: DC

## 2016-08-31 NOTE — Progress Notes (Signed)
CARDIAC REHAB PHASE I   PRE:  Rate/Rhythm: 85 SR  BP:  Supine:   Sitting: 84/62  Standing:    SaO2: 100%RA  MODE:  Ambulation: 370 ft   POST:  Rate/Rhythm: 93 SR  BP:  Supine:   Sitting: 79/60  Standing:    SaO2: 94%RA 0910-1000 Pt walked 370 ft on RA with rolling walker and minimal asst. Denied dizziness with low BP. Education completed with pt who voiced understanding. Discussed low sodium diet and keeping to 2000 mg of sodium, does not have scales for daily weights. Knows to notify cardiologist for increased swelling of feet, stomach or hands. Encouraged IS. Pt quit smoking in 2017. Encouraged walking for ex ed. Discussed CRP 2 and will refer to [facility]. Pt restless. To recliner with call bell.   Luetta Nutting, RN BSN  08/31/2016 9:53 AM

## 2016-08-31 NOTE — Progress Notes (Signed)
Discharge instructions reviewed with the patient to include medications and medication changes, activity and follow up appointments.  Patient voices understanding to teaching.  To door via wheelchair.  Home via POV with her sister driving.

## 2016-08-31 NOTE — Care Management Note (Addendum)
Case Management Note Donn Pierini RN, BSN Unit 4E-Case Manager-- 3W coverage [phone]  Patient Details  Name: Lindsay Camacho MRN: [id number] Date of Birth: April 01, 1965  Subjective/Objective:  Pt admitted  With severe MR, HF- cath done 8/20- plan for MVR this week ?friday 8/24              Action/Plan: PTA pt lived at home-lives in [place]-   CM to follow post op for d/c needs  Expected Discharge Date:  08/31/16               Expected Discharge Plan:  Home/Self Care  In-House Referral:  NA  Discharge planning Services  CM Consult, MATCH Program, Medication Assistance  Post Acute Care Choice:  NA Choice offered to:  NA  DME Arranged:    DME Agency:     HH Arranged:    HH Agency:     Status of Service:  Completed, signed off  If discussed at [employer] of Stay Meetings, dates discussed: 8/28, 8/30  Discharge Disposition: home/self care   Additional Comments:  08/31/16- 1245- Donn Pierini  RN, CM- pt for d/c home today- spoke with pt about medication needs- per conversation pt states that she was able to afford her medications prior to admission- she has a PCP in [place]- [place]- sister is coming to transport home.  Reviewed d/c medications they are all generic and only new ones are Coumadin and her pain meds- will MATCH pt for her pain meds- and do override to assist with cost and make sure she has them- otherwise pt should be able to handle medication cost. MATCH letter given to pt at bedside along with list of pharmacies - program explained to pt along with one time use for medication on discharge.  Pt reports that she has RW at home-   Darrold Span, RN 08/31/2016, 12:50 PM

## 2016-08-31 NOTE — Progress Notes (Addendum)
      [address]       [name] [address]             [phone]        6 Days Post-Op Procedure(s) (LRB): MINIMALLY INVASIVE MITRAL VALVE REPAIR (Right) TRANSESOPHAGEAL ECHOCARDIOGRAM (TEE) (N/A)  Subjective: She is eating breakfast this am. She hopes to go home.  Objective: Vital signs in last 24 hours: Temp:  [97.1 F (36.2 C)-98 F (36.7 C)] 97.1 F (36.2 C) (08/30 0400) Pulse Rate:  [73-90] 79 (08/30 0400) Cardiac Rhythm: Normal sinus rhythm;Bundle branch block (08/30 0700) Resp:  [14-20] 14 (08/30 0400) BP: (85-125)/(61-83) 92/61 (08/30 0400) SpO2:  [98 %-100 %] 98 % (08/30 0400) Weight:  [125 lb 8 oz (56.9 kg)] 125 lb 8 oz (56.9 kg) (08/30 0235)  Pre op weight 58.3 kg Current Weight  08/31/16 125 lb 8 oz (56.9 kg)      Intake/Output from previous day: 08/29 0701 - 08/30 0700 In: 1150 [P.O.:1050; I.V.:100] Out: 2750 [Urine:2750]   Physical Exam:  Cardiovascular: RRR, no murmur. Pulmonary: Mostly clear Abdomen: Soft, non tender, bowel sounds present. Extremities: No LE edema Wounds: Large dressing is clean and dry.     Lab Results: CBC:  Recent Labs  08/29/16 0423 08/31/16 0307  WBC 10.4 9.8  HGB 9.3* 9.8*  HCT 28.3* 31.8*  PLT 233 358   BMET:   Recent Labs  08/29/16 0423 08/31/16 0307  NA 138 138  K 3.6 4.3  CL 99* 99*  CO2 29 31  GLUCOSE 114* 102*  BUN 14 10  CREATININE 0.93 0.87  CALCIUM 8.4* 8.7*    PT/INR:  Lab Results  Component Value Date   INR 2.04 08/31/2016   INR 2.87 08/30/2016   INR 1.89 08/29/2016   ABG:  INR: Will add last result for INR, ABG once components are confirmed Will add last 4 CBG results once components are confirmed  Assessment/Plan:  1. CV - SR in the 70's. On Coreg 6.25 mg bid, Plavix 75 mg daily, Lisinopril 10 mg daily, and Coumadin. As discussed with Dr. Cornelius Moras, will decrease Lisinopril as BP more labile. INR decreased from 2.87 to 2.04. Will continue with Coumadin to 1  mg.  2.  Pulmonary - On room air. Encourage incentive spirometer. 3. Chronic systolic CHF,volume overload- On Lasix 40 mg bid. As discussed with Dr. Cornelius Moras, will discharge on 20 mg daily. 4.  Acute blood loss anemia - H and H stable at 9.8 and 31.8 5. Will discharge later today  ZIMMERMAN,DONIELLE MPA-C 08/31/2016,7:50 AM

## 2016-09-05 ENCOUNTER — Ambulatory Visit (INDEPENDENT_AMBULATORY_CARE_PROVIDER_SITE_OTHER): Payer: Self-pay | Admitting: *Deleted

## 2016-09-05 DIAGNOSIS — Z9889 Other specified postprocedural states: Secondary | ICD-10-CM

## 2016-09-05 DIAGNOSIS — Z5181 Encounter for therapeutic drug level monitoring: Secondary | ICD-10-CM

## 2016-09-05 DIAGNOSIS — I214 Non-ST elevation (NSTEMI) myocardial infarction: Secondary | ICD-10-CM

## 2016-09-05 DIAGNOSIS — I340 Nonrheumatic mitral (valve) insufficiency: Secondary | ICD-10-CM

## 2016-09-05 LAB — POCT INR: INR: 1.3

## 2016-09-05 MED ORDER — WARFARIN SODIUM 1 MG PO TABS: ORAL_TABLET | ORAL | 0 refills | Status: DC

## 2016-09-05 NOTE — Patient Instructions (Signed)

## 2016-09-07 ENCOUNTER — Other Ambulatory Visit: Payer: Self-pay | Admitting: Thoracic Surgery (Cardiothoracic Vascular Surgery)

## 2016-09-07 DIAGNOSIS — Z9889 Other specified postprocedural states: Secondary | ICD-10-CM

## 2016-09-11 ENCOUNTER — Ambulatory Visit (INDEPENDENT_AMBULATORY_CARE_PROVIDER_SITE_OTHER): Payer: Self-pay | Admitting: Thoracic Surgery (Cardiothoracic Vascular Surgery)

## 2016-09-11 ENCOUNTER — Ambulatory Visit (INDEPENDENT_AMBULATORY_CARE_PROVIDER_SITE_OTHER): Payer: Self-pay | Admitting: *Deleted

## 2016-09-11 ENCOUNTER — Ambulatory Visit
Admission: RE | Admit: 2016-09-11 | Discharge: 2016-09-11 | Disposition: A | Discharge status: Home / Self Care | Payer: Self-pay | Source: Ambulatory Visit | Attending: Thoracic Surgery (Cardiothoracic Vascular Surgery) | Admitting: Thoracic Surgery (Cardiothoracic Vascular Surgery)

## 2016-09-11 ENCOUNTER — Encounter: Payer: Self-pay | Admitting: Thoracic Surgery (Cardiothoracic Vascular Surgery)

## 2016-09-11 VITALS — BP 87/67 | HR 90 | Resp 18 | Ht 61.0 in | Wt 125.0 lb

## 2016-09-11 DIAGNOSIS — I340 Nonrheumatic mitral (valve) insufficiency: Secondary | ICD-10-CM

## 2016-09-11 DIAGNOSIS — Z9889 Other specified postprocedural states: Secondary | ICD-10-CM

## 2016-09-11 DIAGNOSIS — I214 Non-ST elevation (NSTEMI) myocardial infarction: Secondary | ICD-10-CM

## 2016-09-11 DIAGNOSIS — Z5181 Encounter for therapeutic drug level monitoring: Secondary | ICD-10-CM

## 2016-09-11 LAB — POCT INR: INR: 1.5

## 2016-09-11 NOTE — Patient Instructions (Signed)
Stop taking lisinopril  Stop taking lasix and potassium  Continue all other previous medications without any changes at this time  You may continue to gradually increase your physical activity as tolerated.  Refrain from any heavy lifting or strenuous use of your arms and shoulders until at least 8 weeks from the time of your surgery, and avoid activities that cause increased pain in your chest on the side of your surgical incision.  Otherwise you may continue to increase activities without any particular limitations.  Increase the intensity and duration of physical activity gradually.

## 2016-09-11 NOTE — Progress Notes (Signed)
[address]       [name] [address]             [phone]     CARDIOTHORACIC SURGERY OFFICE NOTE  Referring Provider is Chrystie Nose, MD Primary Cardiologist is Corky Crafts, MD PCP is System, Pcp Not In   HPI:  Patient is a 51 year old female with history of coronary artery disease status post acute lateral wall myocardial infarction in January 2015, chronic systolic congestive heart failure, mitral regurgitation, left bundle branch block, hypertension, peripheral vascular disease, and hyperlipidemia who initially was hospitalized on 08/17/2016 with acute exacerbation of chronic congestive heart failure and now returns to the office today for follow-up status post minimally invasive mitral valve repair using a 26 mm ring annuloplasty on 08/25/2016.  Her postoperative recovery was uneventful and she was discharged home on the sixth postoperative day.  Since hospital discharge she has had her prothrombin time checked and her Coumadin dose adjusted on 2 occasions, most recently earlier today. Her INR today was subtherapeutic at 1.5 and Coumadin dose was increased. She returns to our office and reports that she has been making slow but steady progress. She feels "tired". She keeps track of her blood pressure at home and has been running somewhat on the low side. She has not been having any dizzy spells. She denies any exertional chest pain. She has been walking a fair amount, albeit at a relatively slow pace. She still gets tired easily but overall she has improved. Appetite is been slowly improving.  She complains of some numbness along her surgical incisional but minimal pain.  Current Outpatient Prescriptions  Medication Sig Dispense Refill  . acetaminophen (TYLENOL) 325 MG tablet Take 650 mg by mouth every 6 (six) hours as needed for moderate pain.     Marland Kitchen atorvastatin (LIPITOR) 80 MG tablet Take 1 tablet (80 mg total) by mouth daily. 30 tablet 5  .  carvedilol (COREG) 6.25 MG tablet Take 6.25 mg by mouth 2 (two) times daily with a meal.    . clopidogrel (PLAVIX) 75 MG tablet Take 1 tablet (75 mg total) by mouth daily with breakfast. 30 tablet 11  . docusate sodium (COLACE) 100 MG capsule Take 100 mg by mouth 2 (two) times daily as needed for mild constipation.    . furosemide (LASIX) 20 MG tablet Take 1 tablet (20 mg total) by mouth daily. 30 tablet 1  . lisinopril (PRINIVIL,ZESTRIL) 2.5 MG tablet Take 1 tablet (2.5 mg total) by mouth daily. 30 tablet 1  . oxyCODONE (OXY IR/ROXICODONE) 5 MG immediate release tablet Take 5 mg by mouth every 4-6 hours PRN severe pain. 30 tablet 0  . potassium chloride (K-DUR) 10 MEQ tablet Take 1 tablet (10 mEq total) by mouth daily. 30 tablet 6  . warfarin (COUMADIN) 1 MG tablet Take as directed by coumadin clinic 50 tablet 0   No current facility-administered medications for this visit.       Physical Exam:   BP (!) 87/67 (BP Location: Right Arm, Patient Position: Sitting, Cuff Size: Normal)   Pulse 90   Resp 18   Ht  (1.549 m)   Wt 125 lb (56.7 kg)   SpO2 98% Comment: RA  BMI 23.62 kg/m   General:  Well-appearing  Chest:   Clear to auscultation with symmetrical breath sounds  CV:   Regular rate and rhythm without murmur  Incisions:  Healing nicely  Abdomen:  Soft nontender  Extremities:  Warm and well-perfused with no lower extremity edema  Diagnostic Tests:   COMPARISON:  08/30/2016  FINDINGS: Heart size is mildly prominent and stable in appearance. Status post valve replacement. There has been improvement in right pleural effusion. There is persistent opacity in the right middle lobe and mid lung zone, suspicious for atelectasis or scarring. The overall opacity has diminished since prior studies. The left lung is clear. No pulmonary edema.  IMPRESSION: 1. Improving aeration in the right lung. 2. Persistent right middle lobe and right mid lung zone opacity compatible with  atelectasis or scarring.   Electronically Signed   By: Norva Pavlov M.D.   On: 09/11/2016 16:16   Impression:  Patient appears to be doing very well just 2-1/2 weeks status post minimally invasive mitral valve repair. Her blood pressure is a little bit on the low side and she looks euvolemic on exam. Her chest x-ray looks very good. She is subtherapeutic on warfarin. She is taking Plavix because of her previous drug-eluting stent. She is intentionally not on aspirin.  Plan:  I have instructed the patient to stop taking lisinopril and Lasix, and potassium for the time being. She will continue to take carvedilol, and she will remain on Coumadin as directed by the Coumadin clinic.  She is scheduled for routine follow-up appointment at [facility] early next week. I've encouraged the patient to continue to gradually increase her physical activity but to avoid any strenuous exertion and to be careful not to do any sort of heavy lifting or strenuous use of her arms or shoulders. She will return to our office for follow-up in approximately 2 months or sooner should specific problems or questions arise.    Salvatore Decent. Cornelius Moras, MD 09/11/2016 4:26 PM

## 2016-09-19 ENCOUNTER — Encounter: Payer: Self-pay | Admitting: Cardiology

## 2016-09-19 ENCOUNTER — Ambulatory Visit (INDEPENDENT_AMBULATORY_CARE_PROVIDER_SITE_OTHER): Payer: Self-pay | Admitting: Cardiology

## 2016-09-19 ENCOUNTER — Ambulatory Visit (INDEPENDENT_AMBULATORY_CARE_PROVIDER_SITE_OTHER): Payer: Self-pay | Admitting: *Deleted

## 2016-09-19 VITALS — BP 100/60 | HR 94 | Ht 61.0 in | Wt 126.8 lb

## 2016-09-19 DIAGNOSIS — Z9889 Other specified postprocedural states: Secondary | ICD-10-CM

## 2016-09-19 DIAGNOSIS — I214 Non-ST elevation (NSTEMI) myocardial infarction: Secondary | ICD-10-CM

## 2016-09-19 DIAGNOSIS — I340 Nonrheumatic mitral (valve) insufficiency: Secondary | ICD-10-CM

## 2016-09-19 DIAGNOSIS — Z5181 Encounter for therapeutic drug level monitoring: Secondary | ICD-10-CM

## 2016-09-19 LAB — POCT INR: INR: 1.8

## 2016-09-19 MED ORDER — ATORVASTATIN CALCIUM 80 MG PO TABS: 80.0000 mg | ORAL_TABLET | Freq: Every day | ORAL | 11 refills | Status: DC

## 2016-09-19 MED ORDER — CLOPIDOGREL BISULFATE 75 MG PO TABS: 75.0000 mg | ORAL_TABLET | Freq: Every day | ORAL | 11 refills | Status: DC

## 2016-09-19 NOTE — Progress Notes (Signed)
09/19/2016 Lindsay Camacho   02/02/1965  [id number]  Primary Physician System, Pcp Not In Primary Cardiologist: Dr. Eldridge Dace   Reason for Visit/CC: [facility] f/u for Mitral Valve Repair  HPI:  Lindsay Camacho is a 51 y.o. female , followed by Dr. Eldridge Dace, who presents to clinic today for post hospital f/u. She has h/o CAD s/p lateral wall MI in 2015 treated with PCI to the CXF. She had a repeat cath in 09/2014 showing patent stent. She unfortunately had a RP bleed post cath and required vascular surgery. She had another R/LHC prior to valve surgery 08/21/16 that showed patent CFX stent with minal restenosis + mild nonobstructive CAD in the RCA and LAD. She also has h/o chronic systolic HF w/ EF of 35-40%, severe MR and tobacco abuse.   She was recently admitted and underwent minimally invasive mitral valve repair, per Dr. Cornelius Moras, for severe symptomatic MR. Surgery was  08/25/16. Post op recovery was fairly uneventfully.   She presents back to clinic today. She has done ok. Dyspnea has improved. She still feels a bit tired. She was seen by Dr. Cornelius Moras the other week and he stopped her lisinopril and Lasix due to hypotension. Her systolic BP was in the 80s and she felt weak. Her Coreg was continued. Her BP today is improved but still soft at 100/60. Pulse rate is in the 90s. She denies CP. No palpitations. No LEE or increased dyspnea. Dr. Cornelius Moras has her on Coumadin. Her INR is being followed in our office. She denies abnormal bleeding. No falls.   Current Meds  Medication Sig  . acetaminophen (TYLENOL) 325 MG tablet Take 650 mg by mouth every 6 (six) hours as needed for moderate pain.   Marland Kitchen atorvastatin (LIPITOR) 80 MG tablet Take 1 tablet (80 mg total) by mouth daily.  . carvedilol (COREG) 6.25 MG tablet Take 6.25 mg by mouth 2 (two) times daily with a meal.  . clopidogrel (PLAVIX) 75 MG tablet Take 1 tablet (75 mg total) by mouth daily with breakfast.  . docusate sodium (COLACE) 100 MG capsule Take 100  mg by mouth 2 (two) times daily as needed for mild constipation.  Marland Kitchen warfarin (COUMADIN) 1 MG tablet Take as directed by coumadin clinic   Allergies  Allergen Reactions  . Brilinta [Ticagrelor] Shortness Of Breath  . Other Other (See Comments)    BP med (unsure of name) BP bottomed out  . Dye Fdc Red [Red Dye] Itching and Other (See Comments)    Allergic to dye that is used in nuclear stress test. Pt sates makes her feel like her blood is boiling and itching  . Penicillins Rash   Past Medical History:  Diagnosis Date  . Acute myocardial infarction of other lateral wall, initial episode of care 01/2013   DES CFX  . Aortoiliac occlusive disease (HCC) 08/22/2016  . CAD (coronary artery disease) 11/13/2011   50% ? proximal LCx stenosis per Cath at [facility]  . Chest pain, atypical, muscular skelatal   01/09/2014  . Dyspnea   . Heart failure (HCC)   . HLD (hyperlipidemia)   . Hypertension   . Intermediate coronary syndrome (HCC)   . LBBB (left bundle branch block)   . Mitral regurgitation   . PVD (peripheral vascular disease) (HCC)    Right CFA stenosis per report on 11/14/2011  . 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/25/2016   Edwards McArthy-Adams IMR ETLogix ring annuloplasty (Model 4100, Serial [id number], size 26) placed via  right mini thoracotomy approach  . Stenosis of left subclavian artery (HCC) 08/22/2016  . Stroke [facility])    tia with no deficits   Family History  Problem Relation Age of Onset  . CAD Mother 67  . Lung cancer Mother   . Bladder Cancer Mother   . Stroke Mother   . Heart disease Mother        Before age 7  . Hypertension Mother   . Heart attack Mother   . CAD Father 23  . Heart disease Father        Before age 11  . Heart attack Father   . Stroke Father        Bleeding stroke   Past Surgical History:  Procedure Laterality Date  . APPENDECTOMY    . CARDIAC CATHETERIZATION  04/14/2013   Non-obstructive disease, patent CFX stent  . CARDIAC  CATHETERIZATION N/A 09/21/2014   Procedure: Left Heart Cath and Coronary Angiography;  Surgeon: Lennette Bihari, MD;  Location: [facility] INVASIVE CV LAB;  Service: Cardiovascular;  Laterality: N/A;  . CORONARY ANGIOPLASTY WITH STENT PLACEMENT  01/17/2013   mild disease except 99% CFX, rx with  2.5 x 28 Alpine drug-eluting stent   . GROIN DEBRIDEMENT Right 04/14/2013   Procedure: Emergency Evacuation of Retroperitoneal Hematoma and Repair Right External Iliac Artery Pseudoaneurysm    ;  Surgeon: Sherren Kerns, MD;  Location: [facility] OR;  Service: Vascular;  Laterality: Right;  . LEFT HEART CATHETERIZATION WITH CORONARY ANGIOGRAM N/A 01/18/2013   Procedure: LEFT HEART CATHETERIZATION WITH CORONARY ANGIOGRAM;  Surgeon: Corky Crafts, MD;  Location: [facility] CATH LAB;  Service: Cardiovascular;  Laterality: N/A;  . LEFT HEART CATHETERIZATION WITH CORONARY ANGIOGRAM N/A 04/14/2013   Procedure: LEFT HEART CATHETERIZATION WITH CORONARY ANGIOGRAM;  Surgeon: Corky Crafts, MD;  Location: [facility] CATH LAB;  Service: Cardiovascular;  Laterality: N/A;  . MITRAL VALVE REPAIR Right 08/25/2016   Procedure: MINIMALLY INVASIVE MITRAL VALVE REPAIR;  Surgeon: Purcell Nails, MD;  Location: [facility] OR;  Service: Open Heart Surgery;  Laterality: Right;  . MULTIPLE EXTRACTIONS WITH ALVEOLOPLASTY N/A 08/23/2016   Procedure: Extraction of tooth #'s 17, 20-23, 26-29 with alveoloplasty;  Surgeon: [name], DDS;  Location: [facility] OR;  Service: Oral Surgery;  Laterality: N/A;  . PERCUTANEOUS CORONARY STENT INTERVENTION (PCI-S)  01/18/2013   Procedure: PERCUTANEOUS CORONARY STENT INTERVENTION (PCI-S);  Surgeon: Corky Crafts, MD;  Location: [facility] CATH LAB;  Service: Cardiovascular;;  . RIGHT/LEFT HEART CATH AND CORONARY ANGIOGRAPHY N/A 08/21/2016   Procedure: RIGHT/LEFT HEART CATH AND CORONARY ANGIOGRAPHY;  Surgeon: Kathleene Hazel, MD;  Location: [facility] INVASIVE CV LAB;  Service: Cardiovascular;  Laterality: N/A;  . TEE WITHOUT  CARDIOVERSION N/A 03/06/2014   Procedure: TRANSESOPHAGEAL ECHOCARDIOGRAM (TEE);  Surgeon: Lars Masson, MD;  Location: [facility] ENDOSCOPY;  Service: Cardiovascular;  Laterality: N/A;  . TEE WITHOUT CARDIOVERSION N/A 08/18/2016   Procedure: TRANSESOPHAGEAL ECHOCARDIOGRAM (TEE);  Surgeon: Lewayne Bunting, MD;  Location: [facility] ENDOSCOPY;  Service: Cardiovascular;  Laterality: N/A;  . TEE WITHOUT CARDIOVERSION N/A 08/25/2016   Procedure: TRANSESOPHAGEAL ECHOCARDIOGRAM (TEE);  Surgeon: Purcell Nails, MD;  Location: [facility] OR;  Service: Open Heart Surgery;  Laterality: N/A;  . TUBAL LIGATION     Social History   Social History  . Marital status: Single    Spouse name: N/A  . Number of children: N/A  . Years of education: N/A   Occupational History  . Not on file.   Social History Main  Topics  . Smoking status: Former Smoker    Packs/day: 0.50    Types: Cigarettes    Quit date: 08/19/2015  . Smokeless tobacco: Never Used     Comment: i QUIT SMOKING A YEAR AGO, i DONT REMEMBER WHAT MONTH  . Alcohol use No  . Drug use: Yes    Types: Marijuana  . Sexual activity: Yes    Birth control/ protection: None   Other Topics Concern  . Not on file   Social History Narrative  . No narrative on file     Review of Systems: General: negative for chills, fever, night sweats or weight changes.  Cardiovascular: negative for chest pain, dyspnea on exertion, edema, orthopnea, palpitations, paroxysmal nocturnal dyspnea or shortness of breath Dermatological: negative for rash Respiratory: negative for cough or wheezing Urologic: negative for hematuria Abdominal: negative for nausea, vomiting, diarrhea, bright red blood per rectum, melena, or hematemesis Neurologic: negative for visual changes, syncope, or dizziness All other systems reviewed and are otherwise negative except as noted above.   Physical Exam:  Blood pressure 100/60, pulse 94, height  (1.549 m), weight 126 lb 12.8 oz (57.5 kg), SpO2  99 %.  General appearance: alert, cooperative and no distress Neck: no carotid bruit and no JVD Lungs: clear to auscultation bilaterally Heart: regular rate and rhythm, S1, S2 normal, no murmur, click, rub or gallop Extremities: extremities normal, atraumatic, no cyanosis or edema Pulses: 2+ and symmetric Skin: Skin color, texture, turgor normal. No rashes or lesions Neurologic: Grossly normal  EKG NSR with LBBB and RAD -- personally reviewed   ASSESSMENT AND PLAN:   1. H/o Severe MR: s/p minimally invasive MV repair per Dr. Cornelius Moras 08/2016. She is on Coumadin. Recovering well. Recommend cardiac rehab.   2. CAD: recent [facility] 08/2016 showed patent CFX stent with minal restenosis + mild nonobstructive CAD in the RCA and LAD. Continue medical therapy. She is w/o CP.   3. Chronic Systolic HF: EF 35-40%. She remains on BB therapy with Coreg. Unfortunately her ACE was stopped by Dr. Cornelius Moras 2/2 to hypotension. She is also off of Lasix, but volume status is stable. No edema. No orthopnea or PND. Pt instructed to monitor weight and for swelling and to call our office if any signs of fluid retention. Avoid salt.   4. Hypotension: resolved after discontinuation of ACE and Diuretic. BP 100/60.   5. Tobacco Abuse: pt notes she is no longer smoking.   Follow-Up w/ Dr. Eldridge Dace in 4-6 months  Kathrene Sinopoli Delmer Islam, MHS [facility] 09/19/2016 11:52 AM

## 2016-09-19 NOTE — Patient Instructions (Addendum)
Medication Instructions: Your physician recommends that you continue on your current medications as directed. Please refer to the Current Medication list given to you today.  Labwork: None Ordered  Procedures/Testing: None Ordered  Follow-Up: Your physician recommends that you schedule a follow-up appointment in: 3-4 MONTHS with Dr. Eldridge Dace  If you need a refill on your cardiac medications before your next appointment, please call your pharmacy.

## 2016-09-26 ENCOUNTER — Encounter (HOSPITAL_COMMUNITY): Payer: Self-pay | Admitting: *Deleted

## 2016-09-26 ENCOUNTER — Emergency Department (HOSPITAL_BASED_OUTPATIENT_CLINIC_OR_DEPARTMENT_OTHER): Payer: Self-pay

## 2016-09-26 ENCOUNTER — Emergency Department (HOSPITAL_COMMUNITY): Payer: Self-pay

## 2016-09-26 ENCOUNTER — Emergency Department (HOSPITAL_COMMUNITY)
Admission: EM | Admit: 2016-09-26 | Discharge: 2016-09-26 | Disposition: A | Discharge status: Home / Self Care | Payer: Self-pay | Attending: Emergency Medicine | Admitting: Emergency Medicine

## 2016-09-26 DIAGNOSIS — I110 Hypertensive heart disease with heart failure: Secondary | ICD-10-CM | POA: Insufficient documentation

## 2016-09-26 DIAGNOSIS — Z9889 Other specified postprocedural states: Secondary | ICD-10-CM

## 2016-09-26 DIAGNOSIS — R072 Precordial pain: Secondary | ICD-10-CM

## 2016-09-26 DIAGNOSIS — R079 Chest pain, unspecified: Secondary | ICD-10-CM

## 2016-09-26 DIAGNOSIS — Z87891 Personal history of nicotine dependence: Secondary | ICD-10-CM | POA: Insufficient documentation

## 2016-09-26 DIAGNOSIS — Z79899 Other long term (current) drug therapy: Secondary | ICD-10-CM | POA: Insufficient documentation

## 2016-09-26 DIAGNOSIS — I2510 Atherosclerotic heart disease of native coronary artery without angina pectoris: Secondary | ICD-10-CM | POA: Insufficient documentation

## 2016-09-26 DIAGNOSIS — I509 Heart failure, unspecified: Secondary | ICD-10-CM | POA: Insufficient documentation

## 2016-09-26 DIAGNOSIS — R071 Chest pain on breathing: Secondary | ICD-10-CM | POA: Insufficient documentation

## 2016-09-26 DIAGNOSIS — R0789 Other chest pain: Secondary | ICD-10-CM

## 2016-09-26 LAB — BASIC METABOLIC PANEL
Anion gap: 9 (ref 5–15)
BUN: 10 mg/dL (ref 6–20)
CALCIUM: 8.7 mg/dL — AB (ref 8.9–10.3)
CO2: 25 mmol/L (ref 22–32)
Chloride: 105 mmol/L (ref 101–111)
Creatinine, Ser: 0.68 mg/dL (ref 0.44–1.00)
GFR calc Af Amer: >60 mL/min (ref >60)
GLUCOSE: 91 mg/dL (ref 65–99)
POTASSIUM: 4 mmol/L (ref 3.5–5.1)
SODIUM: 139 mmol/L (ref 135–145)

## 2016-09-26 LAB — CBC
HEMATOCRIT: 34.2 % — AB (ref 36.0–46.0)
HEMOGLOBIN: 10.7 g/dL — AB (ref 12.0–15.0)
MCH: 28.7 pg (ref 26.0–34.0)
MCHC: 31.3 g/dL (ref 30.0–36.0)
MCV: 91.7 fL (ref 78.0–100.0)
Platelets: 256 10*3/uL (ref 150–400)
RBC: 3.73 MIL/uL — AB (ref 3.87–5.11)
RDW: 16.5 % — ABNORMAL HIGH (ref 11.5–15.5)
WBC: 4.7 10*3/uL (ref 4.0–10.5)

## 2016-09-26 LAB — ECHOCARDIOGRAM COMPLETE
HEIGHTINCHES: 61 in
Weight: 2016 oz

## 2016-09-26 LAB — PROTIME-INR
INR: 2.31
PROTHROMBIN TIME: 25.2 s — AB (ref 11.4–15.2)

## 2016-09-26 LAB — TROPONIN I: Troponin I: <0.03 ng/mL (ref <0.03)

## 2016-09-26 MED ORDER — HYDROCODONE-ACETAMINOPHEN 5-325 MG PO TABS: 1.0000 | ORAL_TABLET | ORAL | 0 refills | Status: DC | PRN

## 2016-09-26 MED ORDER — KETOROLAC TROMETHAMINE 30 MG/ML IJ SOLN
30.0000 mg | Freq: Once | INTRAMUSCULAR | Status: AC
  Administered 2016-09-26: 30 mg via INTRAVENOUS
  Filled 2016-09-26: qty 1

## 2016-09-26 MED ORDER — ONDANSETRON HCL 4 MG PO TABS: 4.0000 mg | ORAL_TABLET | Freq: Four times a day (QID) | ORAL | 0 refills | Status: DC

## 2016-09-26 MED ORDER — ONDANSETRON HCL 4 MG/2ML IJ SOLN
4.0000 mg | Freq: Once | INTRAMUSCULAR | Status: AC
  Administered 2016-09-26: 4 mg via INTRAVENOUS
  Filled 2016-09-26: qty 2

## 2016-09-26 MED ORDER — MORPHINE SULFATE (PF) 4 MG/ML IV SOLN
4.0000 mg | Freq: Once | INTRAVENOUS | Status: AC
  Administered 2016-09-26: 4 mg via INTRAVENOUS
  Filled 2016-09-26: qty 1

## 2016-09-26 NOTE — Progress Notes (Signed)
Patient presents with primarily MSK chest pain that is reproducible on exam. Trop neg x 2. Echo shows stable MV repair, stable LVEF 35-40%, no pericardial effusion. No plans for further cardiac testing at this time. Defer management of muskuloskeltal pain to ER staff, I had written for a 1 time dose of toradol in the ER, would avoid long term NSAIDs given she is on coumadin.    Dina Rich MD

## 2016-09-26 NOTE — ED Notes (Signed)
Pt undergoing Echo

## 2016-09-26 NOTE — Progress Notes (Signed)
*  PRELIMINARY RESULTS* Echocardiogram 2D Echocardiogram has been performed.  Jeryl Columbia 09/26/2016, 3:42 PM

## 2016-09-26 NOTE — Consult Note (Signed)
Primary cardiologist: Dr Isabel Caprice Consulting cardiologist: Dr Dina Rich  Requesting physician: Dr Adriana Simas Indication: chest pain   Clinical Summary Ms. Bezanson is a 51 y.o.female history of CAD with lateral MI in 2015 receiving a stent to LCX, recent cath 08/2016 with patent coronaries, severe MR s/p recent MV repair 08/25/16. Lisinopril and lasix stopped recently due to hypotension. 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  Present with chest pain. Severe sharp/stabbing chest pain midchest started 8AM this morning. Worst with postion, deep breathing, and palpation. +SOB. Pain will last up to 1 hr at at time and reoccur. Better with tylenol, better with moprhine.    K 4.0, Cr 0.68, WBC 4.7, Hgb 10.7, Plt 256, INR 2.3 Trop neg CXR : no acute process, persistent parenchymal density in subpleural location EKG chronic LBBB cath 08/2016 with patent coronaries   Allergies  Allergen Reactions  . Brilinta [Ticagrelor] Shortness Of Breath  . Other Other (See Comments)    BP med (unsure of name) BP bottomed out  . Dye Fdc Red [Red Dye] Itching and Other (See Comments)    Allergic to dye that is used in nuclear stress test. Pt sates makes her feel like her blood is boiling and itching  . Penicillins Rash    Has patient had a PCN reaction causing immediate rash, facial/tongue/throat swelling, SOB or lightheadedness with hypotension: No Has patient had a PCN reaction causing severe rash involving mucus membranes or skin necrosis: Yes Has patient had a PCN reaction that required hospitalization: No Has patient had a PCN reaction occurring within the last 10 years: No If all of the above answers are "NO", then may proceed with Cephalosporin use.     Medications Scheduled Medications:    Infusions:    PRN Medications:     Past Medical History:  Diagnosis Date  . Acute myocardial infarction of other lateral wall, initial episode of care 01/2013   DES CFX  . Aortoiliac occlusive disease (HCC) 08/22/2016  . CAD (coronary artery disease) 11/13/2011   50% ? proximal LCx stenosis per Cath at [facility]  . Chest pain, atypical, muscular skelatal   01/09/2014  . Dyspnea   . Heart failure (HCC)   . HLD (hyperlipidemia)   . Hypertension   . Intermediate coronary syndrome (HCC)   . LBBB (left bundle Ozie Dimaria block)   . Mitral regurgitation   . PVD (peripheral vascular disease) (HCC)    Right CFA stenosis per report on 11/14/2011  . 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/25/2016   Edwards McArthy-Adams IMR ETLogix ring annuloplasty (Model 4100, Serial [id number], size 26) placed via right mini thoracotomy approach  . Stenosis of left subclavian artery (HCC) 08/22/2016  . Stroke [facility])    tia with no deficits    Past Surgical History:  Procedure Laterality Date  . APPENDECTOMY    . CARDIAC CATHETERIZATION  04/14/2013   Non-obstructive disease, patent CFX stent  . CARDIAC CATHETERIZATION N/A 09/21/2014   Procedure: Left Heart Cath and Coronary Angiography;  Surgeon: Lennette Bihari, MD;  Location: [facility] INVASIVE CV LAB;  Service: Cardiovascular;  Laterality: N/A;  . CORONARY ANGIOPLASTY WITH STENT PLACEMENT  01/17/2013   mild disease except 99% CFX, rx with  2.5 x 28 Alpine drug-eluting stent   . GROIN DEBRIDEMENT Right 04/14/2013   Procedure: Emergency Evacuation of Retroperitoneal Hematoma and Repair Right External Iliac Artery Pseudoaneurysm    ;  Surgeon: Sherren Kerns, MD;  Location: [facility] OR;  Service: Vascular;  Laterality: Right;  . LEFT HEART CATHETERIZATION WITH CORONARY ANGIOGRAM N/A 01/18/2013   Procedure: LEFT HEART CATHETERIZATION WITH CORONARY ANGIOGRAM;  Surgeon: Corky Crafts, MD;  Location: [facility] CATH LAB;  Service: Cardiovascular;  Laterality: N/A;  . LEFT HEART CATHETERIZATION WITH CORONARY ANGIOGRAM N/A 04/14/2013   Procedure: LEFT HEART CATHETERIZATION WITH CORONARY ANGIOGRAM;  Surgeon: Corky Crafts, MD;  Location: [facility] CATH  LAB;  Service: Cardiovascular;  Laterality: N/A;  . MITRAL VALVE REPAIR Right 08/25/2016   Procedure: MINIMALLY INVASIVE MITRAL VALVE REPAIR;  Surgeon: Purcell Nails, MD;  Location: [facility] OR;  Service: Open Heart Surgery;  Laterality: Right;  . MULTIPLE EXTRACTIONS WITH ALVEOLOPLASTY N/A 08/23/2016   Procedure: Extraction of tooth #'s 17, 20-23, 26-29 with alveoloplasty;  Surgeon: [name], DDS;  Location: [facility] OR;  Service: Oral Surgery;  Laterality: N/A;  . PERCUTANEOUS CORONARY STENT INTERVENTION (PCI-S)  01/18/2013   Procedure: PERCUTANEOUS CORONARY STENT INTERVENTION (PCI-S);  Surgeon: Corky Crafts, MD;  Location: [facility] CATH LAB;  Service: Cardiovascular;;  . RIGHT/LEFT HEART CATH AND CORONARY ANGIOGRAPHY N/A 08/21/2016   Procedure: RIGHT/LEFT HEART CATH AND CORONARY ANGIOGRAPHY;  Surgeon: Kathleene Hazel, MD;  Location: [facility] INVASIVE CV LAB;  Service: Cardiovascular;  Laterality: N/A;  . TEE WITHOUT CARDIOVERSION N/A 03/06/2014   Procedure: TRANSESOPHAGEAL ECHOCARDIOGRAM (TEE);  Surgeon: Lars Masson, MD;  Location: [facility] ENDOSCOPY;  Service: Cardiovascular;  Laterality: N/A;  . TEE WITHOUT CARDIOVERSION N/A 08/18/2016   Procedure: TRANSESOPHAGEAL ECHOCARDIOGRAM (TEE);  Surgeon: Lewayne Bunting, MD;  Location: [facility] ENDOSCOPY;  Service: Cardiovascular;  Laterality: N/A;  . TEE WITHOUT CARDIOVERSION N/A 08/25/2016   Procedure: TRANSESOPHAGEAL ECHOCARDIOGRAM (TEE);  Surgeon: Purcell Nails, MD;  Location: [facility] OR;  Service: Open Heart Surgery;  Laterality: N/A;  . TUBAL LIGATION      Family History  Problem Relation Age of Onset  . CAD Mother 63  . Lung cancer Mother   . Bladder Cancer Mother   . Stroke Mother   . Heart disease Mother        Before age 40  . Hypertension Mother   . Heart attack Mother   . CAD Father 45  . Heart disease Father        Before age 28  . Heart attack Father   . Stroke Father        Bleeding stroke    Social History Ms. Jim reports  that she quit smoking about 13 months ago. Her smoking use included Cigarettes. She smoked 0.50 packs per day. She has never used smokeless tobacco. Ms. Rigaud reports that she does not drink alcohol.  Review of Systems CONSTITUTIONAL: No weight loss, fever, chills, weakness or fatigue.  HEENT: Eyes: No visual loss, blurred vision, double vision or yellow sclerae. No hearing loss, sneezing, congestion, runny nose or sore throat.  SKIN: No rash or itching.  CARDIOVASCULAR: per hpi  RESPIRATORY: per hpi  GASTROINTESTINAL: No anorexia, nausea, vomiting or diarrhea. No abdominal pain or blood.  GENITOURINARY: no polyuria, no dysuria NEUROLOGICAL: No headache, dizziness, syncope, paralysis, ataxia, numbness or tingling in the extremities. No change in bowel or bladder control.  MUSCULOSKELETAL: No muscle, back pain, joint pain or stiffness.  HEMATOLOGIC: No anemia, bleeding or bruising.  LYMPHATICS: No enlarged nodes. No history of splenectomy.  PSYCHIATRIC: No history of depression or anxiety.      Physical Examination Blood pressure 122/81, pulse 87, temperature 97.9 F (36.6 C), temperature source Oral, resp. rate 11, height 5'  1" (1.549 m), weight 126 lb (57.2 kg), SpO2 98 %. No intake or output data in the 24 hours ending 09/26/16 1350  HEENT: sclera clear, throat clear  Cardiovascular: RRR, no m/r,g no jvd  Respiratory: CTAB  GI: addomen soft, NT, ND  MSK: chestwall tender to palpation  Neuro: no focal deficits  Psych: appropriate affect   Lab Results  Basic Metabolic Panel:  Recent Labs Lab 09/26/16 1103  NA 139  K 4.0  CL 105  CO2 25  GLUCOSE 91  BUN 10  CREATININE 0.68  CALCIUM 8.7*    Liver Function Tests: No results for input(s): AST, ALT, ALKPHOS, BILITOT, PROT, ALBUMIN in the last 168 hours.  CBC:  Recent Labs Lab 09/26/16 1103  WBC 4.7  HGB 10.7*  HCT 34.2*  MCV 91.7  PLT 256    Cardiac Enzymes:  Recent Labs Lab 09/26/16 1103    TROPONINI <0.03    BNP: Invalid input(s): POCBNP     Impression/Recommendations 1. Chest pain - atypical chest pain, worst with position and deep breathing. Tend to palpation on exam - negative cath just last month. Trop neg x 1, EKG chronic LBBB. Repeat second troponin for compleleteness sake. - PE unlikely givne clinical presentation in absence of tachycardia, hypoxia. She is on coumadin but just recently therapeutic. Pain is very reproducible on exam, I suspect this is MSK pain related to her recent surgery.  - f/u 2nd troponin, we will ask for echo to evaluate heart function and valve, evalute for percardial effusion that could suggests pericarditis though symptoms not overall consistent. If negative ER workup and pain controlled could consider ER discharge vs 24 hr obs - avoid long term NSAID use on coumadin but will give a one time dose of toradol in ER.   2. MV repair - patient s/p recent repair - repeat echo today    Dina Rich, M.D.

## 2016-09-26 NOTE — ED Triage Notes (Signed)
[facility] and began having worsening chest pain. Pt had a mitral valve replacement the end of august and has had chest pain since then that has worsened today. Pt was given 324 ASA and 1 nitro in route. Pain is 8/10.

## 2016-09-26 NOTE — ED Notes (Signed)
ED Provider at bedside. 

## 2016-09-26 NOTE — Discharge Instructions (Signed)
Tests for the heart attack were measured 2 and were both negative. Prescription for pain and nausea medicine. Follow-up with your cardiologist

## 2016-09-27 NOTE — ED Provider Notes (Signed)
[facility]-EMERGENCY DEPT Provider Note   CSN: [id number] Arrival date & time: 09/26/16  1051     History   Chief Complaint Chief Complaint  Patient presents with  . Chest Pain    HPI Lindsay Camacho is a 51 y.o. female.  Central chest pain since 8 AM this morning with mild dyspnea; no diaphoresis or nausea. She is status post mitral valve repair on 08/25/16.  Past medical history includes coronary artery disease,heart failure, stroke, htn, many others.  Palpation makes pain worse.  Severity is mild-to-moderate.      Past Medical History:  Diagnosis Date  . Acute myocardial infarction of other lateral wall, initial episode of care 01/2013   DES CFX  . Aortoiliac occlusive disease (HCC) 08/22/2016  . CAD (coronary artery disease) 11/13/2011   50% ? proximal LCx stenosis per Cath at [facility]  . Chest pain, atypical, muscular skelatal   01/09/2014  . Dyspnea   . Heart failure (HCC)   . HLD (hyperlipidemia)   . Hypertension   . Intermediate coronary syndrome (HCC)   . LBBB (left bundle branch block)   . Mitral regurgitation   . PVD (peripheral vascular disease) (HCC)    Right CFA stenosis per report on 11/14/2011  . 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/25/2016   Edwards McArthy-Adams IMR ETLogix ring annuloplasty (Model 4100, Serial [id number], size 26) placed via right mini thoracotomy approach  . Stenosis of left subclavian artery (HCC) 08/22/2016  . Stroke [facility])    tia with no deficits    Patient Active Problem List   Diagnosis Date Noted  . Encounter for therapeutic drug monitoring 09/05/2016  . 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 Stenosis of left subclavian artery (HCC) 08/22/2016  . Aortoiliac occlusive disease (HCC) 08/22/2016  . Acute on chronic systolic CHF (congestive heart failure) (HCC)   . Mitral regurgitation 08/17/2016  . Heart failure (HCC) 08/17/2016  . Shortness of breath   . 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  . Diarrhea  10/05/2014  . Syncope 03/09/2014  . Chronic combined systolic (congestive) and diastolic (congestive) heart failure (HCC) 01/12/2014  . Chest pain, atypical, muscular skelatal   01/09/2014  . Diastolic CHF, acute (HCC) 01/08/2014  . Chest pain at rest 01/08/2014  . Congestive heart disease (HCC)   . Post-op pain-Right groin area 05/27/2013  . Drainage from wound-Groin area 05/27/2013  . Retroperitoneal hematoma 05/01/2013  . Retroperitoneal bleed 04/15/2013  . Anemia due to blood loss 04/15/2013  . Hyperlipidemia with target LDL less than 70 04/15/2013  . Intermediate coronary syndrome (HCC)   . Chest pain 04/11/2013  . CAD - CFX DES 01/17/13 01/20/2013  . Cardiomyopathy, ischemic 01/20/2013  . Rash post PCI 01/20/2013  . NSTEMI (non-ST elevated myocardial infarction) (HCC) 01/17/2013  . Chest pain, musculoskeletal 10/07/2012  . HTN (hypertension) 10/07/2012  . LBBB (left bundle branch block) 10/07/2012  . Tobacco abuse 10/07/2012    Past Surgical History:  Procedure Laterality Date  . APPENDECTOMY    . CARDIAC CATHETERIZATION  04/14/2013   Non-obstructive disease, patent CFX stent  . CARDIAC CATHETERIZATION N/A 09/21/2014   Procedure: Left Heart Cath and Coronary Angiography;  Surgeon: Lennette Bihari, MD;  Location: [facility] INVASIVE CV LAB;  Service: Cardiovascular;  Laterality: N/A;  . CORONARY ANGIOPLASTY WITH STENT PLACEMENT  01/17/2013   mild disease except 99% CFX, rx with  2.5 x 28 Alpine drug-eluting stent   . GROIN DEBRIDEMENT Right 04/14/2013   Procedure: Emergency Evacuation  of Retroperitoneal Hematoma and Repair Right External Iliac Artery Pseudoaneurysm    ;  Surgeon: Sherren Kerns, MD;  Location: [facility] OR;  Service: Vascular;  Laterality: Right;  . LEFT HEART CATHETERIZATION WITH CORONARY ANGIOGRAM N/A 01/18/2013   Procedure: LEFT HEART CATHETERIZATION WITH CORONARY ANGIOGRAM;  Surgeon: Corky Crafts, MD;  Location: [facility] CATH LAB;  Service: Cardiovascular;  Laterality:  N/A;  . LEFT HEART CATHETERIZATION WITH CORONARY ANGIOGRAM N/A 04/14/2013   Procedure: LEFT HEART CATHETERIZATION WITH CORONARY ANGIOGRAM;  Surgeon: Corky Crafts, MD;  Location: [facility] CATH LAB;  Service: Cardiovascular;  Laterality: N/A;  . MITRAL VALVE REPAIR Right 08/25/2016   Procedure: MINIMALLY INVASIVE MITRAL VALVE REPAIR;  Surgeon: Purcell Nails, MD;  Location: [facility] OR;  Service: Open Heart Surgery;  Laterality: Right;  . MULTIPLE EXTRACTIONS WITH ALVEOLOPLASTY N/A 08/23/2016   Procedure: Extraction of tooth #'s 17, 20-23, 26-29 with alveoloplasty;  Surgeon: [name], DDS;  Location: [facility] OR;  Service: Oral Surgery;  Laterality: N/A;  . PERCUTANEOUS CORONARY STENT INTERVENTION (PCI-S)  01/18/2013   Procedure: PERCUTANEOUS CORONARY STENT INTERVENTION (PCI-S);  Surgeon: Corky Crafts, MD;  Location: [facility] CATH LAB;  Service: Cardiovascular;;  . RIGHT/LEFT HEART CATH AND CORONARY ANGIOGRAPHY N/A 08/21/2016   Procedure: RIGHT/LEFT HEART CATH AND CORONARY ANGIOGRAPHY;  Surgeon: Kathleene Hazel, MD;  Location: [facility] INVASIVE CV LAB;  Service: Cardiovascular;  Laterality: N/A;  . TEE WITHOUT CARDIOVERSION N/A 03/06/2014   Procedure: TRANSESOPHAGEAL ECHOCARDIOGRAM (TEE);  Surgeon: Lars Masson, MD;  Location: [facility] ENDOSCOPY;  Service: Cardiovascular;  Laterality: N/A;  . TEE WITHOUT CARDIOVERSION N/A 08/18/2016   Procedure: TRANSESOPHAGEAL ECHOCARDIOGRAM (TEE);  Surgeon: Lewayne Bunting, MD;  Location: [facility] ENDOSCOPY;  Service: Cardiovascular;  Laterality: N/A;  . TEE WITHOUT CARDIOVERSION N/A 08/25/2016   Procedure: TRANSESOPHAGEAL ECHOCARDIOGRAM (TEE);  Surgeon: Purcell Nails, MD;  Location: [facility] OR;  Service: Open Heart Surgery;  Laterality: N/A;  . TUBAL LIGATION      OB History    No data available       Home Medications    Prior to Admission medications   Medication Sig Start Date End Date Taking? Authorizing Provider  acetaminophen (TYLENOL) 325 MG tablet Take 650  mg by mouth every 6 (six) hours as needed for moderate pain.    Yes [provider]  atorvastatin (LIPITOR) 80 MG tablet Take 1 tablet (80 mg total) by mouth daily. 09/19/16  Yes Robbie Lis M, PA-C  carvedilol (COREG) 6.25 MG tablet Take 6.25 mg by mouth 2 (two) times daily with a meal.   Yes [provider]  clopidogrel (PLAVIX) 75 MG tablet Take 1 tablet (75 mg total) by mouth daily with breakfast. 09/19/16  Yes Robbie Lis M, PA-C  docusate sodium (COLACE) 100 MG capsule Take 100 mg by mouth 2 (two) times daily as needed for mild constipation.   Yes [provider]  warfarin (COUMADIN) 1 MG tablet Take as directed by coumadin clinic Patient taking differently: Take 1 mg by mouth 2 (two) times daily. Take as directed by coumadin clinic 09/05/16  Yes Corky Crafts, MD  HYDROcodone-acetaminophen (NORCO/VICODIN) 5-325 MG tablet Take 1 tablet by mouth every 4 (four) hours as needed. 09/26/16   Donnetta Hutching, MD  ondansetron (ZOFRAN) 4 MG tablet Take 1 tablet (4 mg total) by mouth every 6 (six) hours. 09/26/16   Donnetta Hutching, MD    Family History Family History  Problem Relation Age of Onset  . CAD Mother 92  .  Lung cancer Mother   . Bladder Cancer Mother   . Stroke Mother   . Heart disease Mother        Before age 52  . Hypertension Mother   . Heart attack Mother   . CAD Father 71  . Heart disease Father        Before age 69  . Heart attack Father   . Stroke Father        Bleeding stroke    Social History Social History  Substance Use Topics  . Smoking status: Former Smoker    Packs/day: 0.50    Types: Cigarettes    Quit date: 08/19/2015  . Smokeless tobacco: Never Used     Comment: i QUIT SMOKING A YEAR AGO, i DONT REMEMBER WHAT MONTH  . Alcohol use No     Allergies   Brilinta [ticagrelor]; Other; Dye fdc red [red dye]; and Penicillins   Review of Systems Review of Systems  All other systems reviewed and are  negative.    Physical Exam Updated Vital Signs BP 121/78   Pulse 83   Temp 97.9 F (36.6 C) (Oral)   Resp 13   Ht  (1.549 m)   Wt 57.2 kg (126 lb)   SpO2 94%   BMI 23.81 kg/m   Physical Exam  Constitutional: She is oriented to person, place, and time. She appears well-developed and well-nourished.  HENT:  Head: Normocephalic and atraumatic.  Eyes: Conjunctivae are normal.  Neck: Neck supple.  Cardiovascular: Normal rate and regular rhythm.   Pulmonary/Chest:  Tender to palpation anterior chest wall  Abdominal: Soft. Bowel sounds are normal.  Musculoskeletal: Normal range of motion.  Neurological: She is alert and oriented to person, place, and time.  Skin: Skin is warm and dry.  Psychiatric: She has a normal mood and affect. Her behavior is normal.  Nursing note and vitals reviewed.    ED Treatments / Results  Labs (all labs ordered are listed, but only abnormal results are displayed) Labs Reviewed  BASIC METABOLIC PANEL - Abnormal; Notable for the following:       Result Value   Calcium 8.7 (*)    All other components within normal limits  CBC - Abnormal; Notable for the following:    RBC 3.73 (*)    Hemoglobin 10.7 (*)    HCT 34.2 (*)    RDW 16.5 (*)    All other components within normal limits  PROTIME-INR - Abnormal; Notable for the following:    Prothrombin Time 25.2 (*)    All other components within normal limits  TROPONIN I  TROPONIN I    EKG  EKG Interpretation  Date/Time:  Tuesday September 26 2016 10:56:03 EDT Ventricular Rate:  91 PR Interval:    QRS Duration: 152 QT Interval:  430 QTC Calculation: 530 R Axis:   104 Text Interpretation:  Sinus rhythm Left atrial enlargement Nonspecific intraventricular conduction delay Probable anteroseptal infarct, recent Lateral leads are also involved Confirmed by Donnetta Hutching ([id number]) on 09/26/2016 12:36:09 PM       Radiology Dg Chest 2 View  Result Date: 09/26/2016 CLINICAL DATA:  Awakened  with mid chest pain and shortness of breath this morning. History of open heart surgery on August 25, 2016 EXAM: CHEST  2 VIEW COMPARISON:  Chest x-ray of September 11, 2016, August 25, 2016, and August 17, 2016. FINDINGS: The left lung is well-expanded and clear. On the right there is persistent soft tissue density in the mid  lung laterally which has improved since the August 24th study. There is no pleural effusion. The cardiac silhouette is mildly enlarged. A prosthetic mitral valve ring is visible. IMPRESSION: Persistent abnormal parenchymal density in a subpleural location in the right mid lung laterally. This is in an area previous abnormality and is little changed since the study of 15 days ago. Cardiomegaly without pulmonary vascular congestion. Electronically Signed   By: David  [place] M.D.   On: 09/26/2016 11:53    Procedures Procedures (including critical care time)  Medications Ordered in ED Medications  morphine 4 MG/ML injection 4 mg (4 mg Intravenous Given 09/26/16 1217)  ondansetron (ZOFRAN) injection 4 mg (4 mg Intravenous Given 09/26/16 1217)  ketorolac (TORADOL) 30 MG/ML injection 30 mg (30 mg Intravenous Given 09/26/16 1430)     Initial Impression / Assessment and Plan / ED Course  I have reviewed the triage vital signs and the nursing notes.  Pertinent labs & imaging results that were available during my care of the patient were reviewed by me and considered in my medical decision making (see chart for details).     Patient is tender to palpation. A cardiology consult was obtained.Troponins were negative 2. EKG shows no acute changes. Suspect musculoskeletal chest pain.  She will follow-up with her primary care doctor.  Final Clinical Impressions(s) / ED Diagnoses   Final diagnoses:  Precordial pain    New Prescriptions Discharge Medication List as of 09/26/2016  3:45 PM    START taking these medications   Details  HYDROcodone-acetaminophen (NORCO/VICODIN) 5-325 MG  tablet Take 1 tablet by mouth every 4 (four) hours as needed., Starting Tue 09/26/2016, Print    ondansetron (ZOFRAN) 4 MG tablet Take 1 tablet (4 mg total) by mouth every 6 (six) hours., Starting Tue 09/26/2016, Print         Donnetta Hutching, MD 09/27/16 [phone]

## 2016-10-03 ENCOUNTER — Ambulatory Visit (INDEPENDENT_AMBULATORY_CARE_PROVIDER_SITE_OTHER): Payer: Self-pay | Admitting: *Deleted

## 2016-10-03 DIAGNOSIS — Z9889 Other specified postprocedural states: Secondary | ICD-10-CM

## 2016-10-03 DIAGNOSIS — Z5181 Encounter for therapeutic drug level monitoring: Secondary | ICD-10-CM

## 2016-10-03 DIAGNOSIS — I340 Nonrheumatic mitral (valve) insufficiency: Secondary | ICD-10-CM

## 2016-10-03 DIAGNOSIS — I214 Non-ST elevation (NSTEMI) myocardial infarction: Secondary | ICD-10-CM

## 2016-10-03 LAB — POCT INR: INR: 2.6

## 2016-10-13 ENCOUNTER — Other Ambulatory Visit: Payer: Self-pay | Admitting: *Deleted

## 2016-10-13 MED ORDER — WARFARIN SODIUM 1 MG PO TABS: ORAL_TABLET | ORAL | 1 refills | Status: DC

## 2016-10-24 ENCOUNTER — Ambulatory Visit (INDEPENDENT_AMBULATORY_CARE_PROVIDER_SITE_OTHER): Payer: Self-pay | Admitting: *Deleted

## 2016-10-24 DIAGNOSIS — Z5181 Encounter for therapeutic drug level monitoring: Secondary | ICD-10-CM

## 2016-10-24 DIAGNOSIS — I340 Nonrheumatic mitral (valve) insufficiency: Secondary | ICD-10-CM

## 2016-10-24 DIAGNOSIS — Z9889 Other specified postprocedural states: Secondary | ICD-10-CM

## 2016-10-24 DIAGNOSIS — I214 Non-ST elevation (NSTEMI) myocardial infarction: Secondary | ICD-10-CM

## 2016-10-24 LAB — POCT INR: INR: 1.7

## 2016-11-07 ENCOUNTER — Ambulatory Visit (INDEPENDENT_AMBULATORY_CARE_PROVIDER_SITE_OTHER): Payer: Self-pay | Admitting: *Deleted

## 2016-11-07 DIAGNOSIS — I340 Nonrheumatic mitral (valve) insufficiency: Secondary | ICD-10-CM

## 2016-11-07 DIAGNOSIS — I214 Non-ST elevation (NSTEMI) myocardial infarction: Secondary | ICD-10-CM

## 2016-11-07 DIAGNOSIS — Z5181 Encounter for therapeutic drug level monitoring: Secondary | ICD-10-CM

## 2016-11-07 DIAGNOSIS — Z9889 Other specified postprocedural states: Secondary | ICD-10-CM

## 2016-11-07 LAB — POCT INR: INR: 1.9

## 2016-11-13 ENCOUNTER — Encounter: Payer: Self-pay | Admitting: Thoracic Surgery (Cardiothoracic Vascular Surgery)

## 2016-11-20 ENCOUNTER — Other Ambulatory Visit: Payer: Self-pay

## 2016-11-20 ENCOUNTER — Encounter: Payer: Self-pay | Admitting: Thoracic Surgery (Cardiothoracic Vascular Surgery)

## 2016-11-20 ENCOUNTER — Ambulatory Visit (INDEPENDENT_AMBULATORY_CARE_PROVIDER_SITE_OTHER): Payer: Self-pay | Admitting: Thoracic Surgery (Cardiothoracic Vascular Surgery)

## 2016-11-20 VITALS — BP 114/80 | HR 109 | Ht 61.0 in | Wt 125.0 lb

## 2016-11-20 DIAGNOSIS — Z9889 Other specified postprocedural states: Secondary | ICD-10-CM

## 2016-11-20 NOTE — Progress Notes (Signed)
[address]       [place] [address]             [phone]     CARDIOTHORACIC SURGERY OFFICE NOTE  Referring Provider is Chrystie Nose, MD Primary Cardiologist is Corky Crafts, MD PCP is System, Pcp Not In   HPI:  Patient is a 51 year old female with history of coronary artery disease status post acute lateral wall myocardial infarction in January 2015, chronic systolic congestive heart failure, mitral regurgitation, left bundle branch block, hypertension, peripheral vascular disease, and hyperlipidemia who initially was hospitalized on 08/17/2016 with acute exacerbation of chronic congestive heart failure and now returns to the office today for follow-up status post minimally invasive mitral valve repair using a 26 mm ring annuloplasty on 08/25/2016 for severe ischemic mitral regurgitation.  Her postoperative recovery was uneventful and she was discharged home on the sixth postoperative day.  She was last seen here in our office on September 11, 2016 at which time she was making slow but steady progress and overall getting along reasonably well.  Since then she underwent follow-up echocardiogram September 26, 2016.  At that time the patient had presented to the emergency department with an acute exacerbation of chest wall pain that was felt to be musculoskeletal in origin.  The echo revealed intact mitral valve repair with no residual mitral regurgitation and no mitral stenosis.  Mean transvalvular gradient was estimated 3 mmHg.  Left ventricular ejection fraction was estimated 35-40% which was essentially unchanged in comparison with her preoperative echo.  She returns to our office for routine follow-up today.  She reports that she is doing quite well.  She states that she does get short of breath when she lays flat in bed and she still gets short of breath with more strenuous physical exertion, but she has been getting around very well and quite active  physically.  Overall she feels much better than she did prior to surgery.  She has not yet enrolled in outpatient cardiac rehab program.  The soreness in her chest wall has resolved.   Current Outpatient Medications  Medication Sig Dispense Refill  . acetaminophen (TYLENOL) 325 MG tablet Take 650 mg by mouth every 6 (six) hours as needed for moderate pain.     Marland Kitchen atorvastatin (LIPITOR) 80 MG tablet Take 1 tablet (80 mg total) by mouth daily. 30 tablet 11  . carvedilol (COREG) 6.25 MG tablet Take 6.25 mg by mouth 2 (two) times daily with a meal.    . clopidogrel (PLAVIX) 75 MG tablet Take 1 tablet (75 mg total) by mouth daily with breakfast. 30 tablet 11  . docusate sodium (COLACE) 100 MG capsule Take 100 mg by mouth 2 (two) times daily as needed for mild constipation.    Marland Kitchen HYDROcodone-acetaminophen (NORCO/VICODIN) 5-325 MG tablet Take 1 tablet by mouth every 4 (four) hours as needed. 15 tablet 0  . ondansetron (ZOFRAN) 4 MG tablet Take 1 tablet (4 mg total) by mouth every 6 (six) hours. 8 tablet 0  . warfarin (COUMADIN) 1 MG tablet Take as directed by coumadin clinic 60 tablet 1   No current facility-administered medications for this visit.       Physical Exam:   BP 114/80   Pulse (!) 109   Ht 5\' 1"  (1.549 m)   Wt 125 lb (56.7 kg)   SpO2 97%   BMI 23.62 kg/m   General:  Well-appearing  Chest:   Clear to  auscultation with symmetric breath sounds  CV:   Regular rate and rhythm without murmur  Incisions:  Well-healed  Abdomen:  Soft nontender  Extremities:  Warm and well-perfused, no edema  Diagnostic Tests:  Transthoracic Echocardiography  Patient:    Lindsay Camacho, Lindsay Camacho MR #:       [id number] Study Date: 09/26/2016 Gender:     F Age:        51 Height:     154.9 cm Weight:     57.2 kg BSA:        1.58 m^2 Pt. Status: Room:   ATTENDING    Donnetta HutchingCook, Brian [phone]  SONOGRAPHER  Jeryl ColumbiaJohanna Elliott  Lisette AbuDERING     Jonathon Branch, M.D.  REFERRING    Patrick JupiterJonathon Branch, M.D.  PERFORMING    [facility], Jeani HawkingAnnie Penn  cc:  ------------------------------------------------------------------- LV EF: 35% -   40%  ------------------------------------------------------------------- Indications:      Chest pain 786.51.  ------------------------------------------------------------------- History:   PMH:  LBBB, Former Smoker, Mitral valve repair. Coronary artery disease.  Congestive heart failure.  PMH: Myocardial infarction.  Risk factors:  Hypertension.  ------------------------------------------------------------------- Study Conclusions  - Left ventricle: The cavity size was normal. Wall thickness was   increased in a pattern of mild LVH. Systolic function was   moderately reduced. The estimated ejection fraction was in the   range of 35% to 40%. Diffuse hypokinesis. - Aortic valve: There was moderate regurgitation. Valve area (VTI):   1.76 cm^2. Valve area (Vmax): 1.56 cm^2. - Mitral valve: There is a 26 mm Edwards McArthy-Adams anular ring   present at the MV anulus. There was no evidence for stenosis.   There was no significant regurgitation. Mean gradient (D): 3 mm   Hg. - Left atrium: The atrium was mildly dilated. - Pulmonary arteries: PA peak pressure: 32 mm Hg (S). PASP is   borderline elevated. - Technically adequate study.  ------------------------------------------------------------------- Study data:  Comparison was made to the study of 08/17/2016.  Study status:  Routine.  Procedure:  Transthoracic echocardiography. Image quality was adequate.          Transthoracic echocardiography.  M-mode, complete 2D, spectral Doppler, and color Doppler.  Birthdate:  Patient birthdate: Feb 05, 1965.  Age:  Patient is 51 yr old.  Sex:  Gender: female.    BMI: 23.8 kg/m^2.  Blood pressure:     124/92  Patient status:  Inpatient.  Study date: Study date: 09/26/2016. Study time: 03:00 PM.  Location:  Bedside.     -------------------------------------------------------------------  ------------------------------------------------------------------- Left ventricle:  The cavity size was normal. Wall thickness was increased in a pattern of mild LVH. Systolic function was moderately reduced. The estimated ejection fraction was in the range of 35% to 40%. Unable to evaluate diastolic function in setting of MV ring. Diffuse hypokinesis.  ------------------------------------------------------------------- Aortic valve:   Trileaflet; normal thickness leaflets.  Doppler: There was no stenosis.   There was moderate regurgitation.    VTI ratio of LVOT to aortic valve: 0.62. Valve area (VTI): 1.76 cm^2. Indexed valve area (VTI): 1.12 cm^2/m^2. Peak velocity ratio of LVOT to aortic valve: 0.55. Valve area (Vmax): 1.56 cm^2. Indexed valve area (Vmax): 0.99 cm^2/m^2.    Mean gradient (S): 5 mm Hg. Peak gradient (S): 11 mm Hg.  ------------------------------------------------------------------- Aorta:  Aortic root: The aortic root was normal in size.  ------------------------------------------------------------------- Mitral valve:  There is a 26 mm Edwards McArthy-Adams anular ring present at the MV anulus.  Doppler:   There was no evidence for stenosis.  There was no significant regurgitation.    Valve area by pressure half-time: 2.97 cm^2.    Mean gradient (D): 3 mm Hg. Peak gradient (D): 7 mm Hg.  ------------------------------------------------------------------- Left atrium:  The atrium was mildly dilated.  ------------------------------------------------------------------- Atrial septum:  Poorly visualized.  ------------------------------------------------------------------- Right ventricle:  The cavity size was normal. Systolic function was normal.  ------------------------------------------------------------------- Pulmonic valve:   Not well visualized.  Doppler:   There was  no evidence for stenosis.   There was no significant regurgitation.   ------------------------------------------------------------------- Tricuspid valve:   Normal thickness leaflets.  Doppler:   There was no evidence for stenosis.   There was mild regurgitation.  ------------------------------------------------------------------- Pulmonary artery:   PASP is borderline elevated.  ------------------------------------------------------------------- Right atrium:  The atrium was normal in size.  ------------------------------------------------------------------- Pericardium:  There was no pericardial effusion.  ------------------------------------------------------------------- Systemic veins: Inferior vena cava: The vessel was normal in size. The respirophasic diameter changes were in the normal range (>= 50%), consistent with normal central venous pressure.  ------------------------------------------------------------------- Measurements   Left ventricle                           Value           Reference  LV ID, ED, PLAX chordal                  48.4   mm       43 - 52  LV ID, ES, PLAX chordal          (H)     39.5   mm       23 - 38  LV fx shortening, PLAX chordal   (Camacho)     18     %        >=29  LV PW thickness, ED                      10.6   mm       ---------  IVS/LV PW ratio, ED                      0.94            <=1.3  LV ejection time                         270    ms       ---------    Ventricular septum                       Value           Reference  IVS thickness, ED                        10     mm       ---------    LVOT                                     Value           Reference  LVOT ID, S                               19  mm       ---------  LVOT area                                2.84   cm^2     ---------  LVOT peak velocity, S                    91.03  cm/s     ---------  LVOT VTI, S                              17.71  cm       ---------  Stroke  volume (SV), LVOT DP              50.2   ml       ---------  Stroke index (SV/bsa), LVOT DP           31.8   ml/m^2   ---------    Aortic valve                             Value           Reference  Aortic valve peak velocity, S            164.08 cm/s     ---------  Aortic valve mean velocity, S            103.87 cm/s     ---------  Aortic valve VTI, S                      28.53  cm       ---------  Aortic mean gradient, S                  5      mm Hg    ---------  Aortic peak gradient, S                  11     mm Hg    ---------  VTI ratio, LVOT/AV                       0.62            ---------  Aortic valve area, VTI                   1.76   cm^2     ---------  Aortic valve area/bsa, VTI               1.12   cm^2/m^2 ---------  Velocity ratio, peak, LVOT/AV            0.55            ---------  Aortic valve area, peak velocity         1.56   cm^2     ---------  Aortic valve area/bsa, peak              0.99   cm^2/m^2 ---------  velocity  Aortic regurg pressure half-time         407    ms       ---------    Aorta  Value           Reference  Aortic root ID, ED                       23     mm       ---------    Left atrium                              Value           Reference  LA ID, A-P, ES                           35     mm       ---------  LA volume/bsa, ES, 1-p A4C               28.2   ml/m^2   ---------    Mitral valve                             Value           Reference  Mitral E-wave peak velocity              136    cm/s     ---------  Mitral A-wave peak velocity              58     cm/s     ---------  Mitral mean velocity, D                  78.8   cm/s     ---------  Mitral deceleration time                 198    ms       150 - 230  Mitral pressure half-time                74     ms       ---------  Mitral mean gradient, D                  3      mm Hg    ---------  Mitral peak gradient, D                  7      mm Hg    ---------   Mitral E/A ratio, peak                   2.3             ---------  Mitral valve area, PHT, DP               2.97   cm^2     ---------    Pulmonary arteries                       Value           Reference  PA pressure, S, DP               (H)     32     mm Hg    <=30    Right ventricle  Value           Reference  TAPSE                                    18.3   mm       ---------  Legend: (Camacho)  and  (H)  mark values outside specified reference range.  ------------------------------------------------------------------- Prepared and Electronically Authenticated by  Patrick Jupiter, M.D. 2018-09-25T15:55:11   Impression:  Patient is doing very well approximately 3 months status post minimally invasive mitral valve repair for severe secondary (ischemic) mitral regurgitation.  I have personally reviewed the patient's most recent follow-up echocardiogram which demonstrates intact valve repair with no residual mitral regurgitation, no mitral stenosis, and stable left ventricular systolic function.    Plan:  I have instructed the patient that she may stop taking Coumadin and resume taking aspirin 81 mg daily in addition to Plavix 75 mg daily.  We have otherwise not recommended any changes to her current medications.  She might benefit from addition of an ACE inhibitor or ARB, but we will defer this decision to Dr. Eldridge Dace.  The patient has been encouraged to continue to increase her physical activity without any particular limitations at this time.  I have strongly encouraged her to enroll and participate in the outpatient cardiac rehab program.  The patient has been reminded regarding the importance of dental hygiene and the lifelong need for antibiotic prophylaxis for all dental cleanings and other related invasive procedures.  All of her questions been addressed.  The patient will return to our office next August for routine follow-up approximately 1 year following her  surgery.  She will call and return sooner should specific problems or questions arise.   Salvatore Decent. Cornelius Moras, MD 11/20/2016 3:35 PM

## 2016-11-20 NOTE — Patient Instructions (Signed)
Stop taking Coumadin (warfarin) and resume taking aspirin in addition to Plavix  Continue all other previous medications without any changes at this time  You are encouraged to enroll and participate in the outpatient cardiac rehab program beginning as soon as practical.  You may resume unrestricted physical activity without any particular limitations at this time.

## 2016-12-20 ENCOUNTER — Ambulatory Visit: Payer: Self-pay | Admitting: Interventional Cardiology

## 2017-01-01 ENCOUNTER — Encounter (INDEPENDENT_AMBULATORY_CARE_PROVIDER_SITE_OTHER): Payer: Self-pay

## 2017-01-01 ENCOUNTER — Ambulatory Visit (INDEPENDENT_AMBULATORY_CARE_PROVIDER_SITE_OTHER): Payer: Self-pay | Admitting: Interventional Cardiology

## 2017-01-01 ENCOUNTER — Encounter: Payer: Self-pay | Admitting: Interventional Cardiology

## 2017-01-01 VITALS — BP 114/68 | HR 93 | Ht 61.0 in | Wt 133.4 lb

## 2017-01-01 DIAGNOSIS — I340 Nonrheumatic mitral (valve) insufficiency: Secondary | ICD-10-CM

## 2017-01-01 DIAGNOSIS — I10 Essential (primary) hypertension: Secondary | ICD-10-CM

## 2017-01-01 DIAGNOSIS — E785 Hyperlipidemia, unspecified: Secondary | ICD-10-CM

## 2017-01-01 NOTE — Patient Instructions (Addendum)
Medication Instructions:  Your physician recommends that you continue on your current medications as directed. Please refer to the Current Medication list given to you today.   Labwork: TODAY: BMET  Testing/Procedures: None ordered  Follow-Up: Your physician wants you to follow-up in: 6 months with Dr. Varanasi. You will receive a reminder letter in the mail two months in advance. If you don't receive a letter, please call our office to schedule the follow-up appointment.   Any Other Special Instructions Will Be Listed Below (If Applicable).     If you need a refill on your cardiac medications before your next appointment, please call your pharmacy.   

## 2017-01-01 NOTE — Progress Notes (Signed)
Cardiology Office Note   Date:  01/01/2017   ID:  Lindsay Camacho, DOB January 10, 1965, MRN [id number]  PCP:  System, Pcp Not In    No chief complaint on file. CAD, MV repair   Wt Readings from Last 3 Encounters:  01/01/17 133 lb 6.4 oz (60.5 kg)  11/20/16 125 lb (56.7 kg)  09/26/16 126 lb (57.2 kg)       History of Present Illness: Lindsay Camacho is a 51 y.o. female with a h/o CAD s/p lateral wall MI in 2015 treated with PCI to the CXF. She had a repeat cath in 09/2014 showing patent stent. She unfortunately had a RP bleed post cath and required vascular surgery. She had another R/LHC prior to valve surgery 08/21/16 that showed patent CFX stent with minal restenosis + mild nonobstructive CAD in the RCA and LAD. She also has h/o chronic systolic HF w/ EF of 35-40%, severe MR and tobacco abuse.   She was recently admitted and underwent minimally invasive mitral valve repair, per Dr. Cornelius Moraswen, for severe symptomatic MR. Surgery was  08/25/16. Post op recovery was fairly uneventfully.      Denies : Chest pain. Dizziness. Leg edema. Nitroglycerin use. Orthopnea. Palpitations. Paroxysmal nocturnal dyspnea. Syncope.   Takes Lasix once a week or fluid overload.  She reports that Dr. Cornelius Moraswen stopped her potassium.      Past Medical History:  Diagnosis Date  . Acute myocardial infarction of other lateral wall, initial episode of care 01/2013   DES CFX  . Aortoiliac occlusive disease (HCC) 08/22/2016  . CAD (coronary artery disease) 11/13/2011   50% ? proximal LCx stenosis [facility]  . Chest pain, atypical, muscular skelatal   01/09/2014  . Dyspnea   . Heart failure (HCC)   . HLD (hyperlipidemia)   . Hypertension   . Intermediate coronary syndrome (HCC)   . LBBB (left bundle branch block)   . Mitral regurgitation   . PVD (peripheral vascular disease) (HCC)    Right CFA stenosis per report on 11/14/2011  . 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/25/2016   Edwards McArthy-Adams IMR  ETLogix ring annuloplasty (Model 4100, Serial [id number], size 26) placed via right mini thoracotomy approach  . Stenosis of left subclavian artery (HCC) 08/22/2016  . Stroke [facility](HCC)    tia with no deficits    Past Surgical History:  Procedure Laterality Date  . APPENDECTOMY    . CARDIAC CATHETERIZATION  04/14/2013   Non-obstructive disease, patent CFX stent  . CARDIAC CATHETERIZATION N/A 09/21/2014   Procedure: Left Heart Cath and Coronary Angiography;  Surgeon: Lennette Biharihomas A Kelly, MD;  [facility];  Service: Cardiovascular;  Laterality: N/A;  . CORONARY ANGIOPLASTY WITH STENT PLACEMENT  01/17/2013   mild disease except 99% CFX, rx with  2.5 x 28 Alpine drug-eluting stent   . GROIN DEBRIDEMENT Right 04/14/2013   Procedure: Emergency Evacuation of Retroperitoneal Hematoma and Repair Right External Iliac Artery Pseudoaneurysm    ;  Surgeon: Sherren Kernsharles E Fields, MD;  [facility];  Service: Vascular;  Laterality: Right;  . LEFT HEART CATHETERIZATION WITH CORONARY ANGIOGRAM N/A 01/18/2013   Procedure: LEFT HEART CATHETERIZATION WITH CORONARY ANGIOGRAM;  Surgeon: Corky CraftsJayadeep S Delshon Blanchfield, MD;  [facility];  Service: Cardiovascular;  Laterality: N/A;  . LEFT HEART CATHETERIZATION WITH CORONARY ANGIOGRAM N/A 04/14/2013   Procedure: LEFT HEART CATHETERIZATION WITH CORONARY ANGIOGRAM;  Surgeon: Corky CraftsJayadeep S Suanne Minahan, MD;  [facility];  Service:  Cardiovascular;  Laterality: N/A;  . MITRAL VALVE REPAIR Right 08/25/2016   Procedure: MINIMALLY INVASIVE MITRAL VALVE REPAIR;  Surgeon: Purcell Nails, MD;  Location: [facility] OR;  Service: Open Heart Surgery;  Laterality: Right;  . MULTIPLE EXTRACTIONS WITH ALVEOLOPLASTY N/A 08/23/2016   Procedure: Extraction of tooth #'s 17, 20-23, 26-29 with alveoloplasty;  Surgeon: [name], DDS;  [facility];  Service: Oral Surgery;  Laterality: N/A;  . PERCUTANEOUS CORONARY STENT INTERVENTION (PCI-S)  01/18/2013   Procedure: PERCUTANEOUS  CORONARY STENT INTERVENTION (PCI-S);  Surgeon: Corky Crafts, MD;  Location: [facility] CATH LAB;  Service: Cardiovascular;;  . RIGHT/LEFT HEART CATH AND CORONARY ANGIOGRAPHY N/A 08/21/2016   Procedure: RIGHT/LEFT HEART CATH AND CORONARY ANGIOGRAPHY;  Surgeon: Kathleene Hazel, MD;  [facility] LAB;  Service: Cardiovascular;  Laterality: N/A;  . TEE WITHOUT CARDIOVERSION N/A 03/06/2014   Procedure: TRANSESOPHAGEAL ECHOCARDIOGRAM (TEE);  Surgeon: Lars Masson, MD;  Location: [facility] ENDOSCOPY;  Service: Cardiovascular;  Laterality: N/A;  . TEE WITHOUT CARDIOVERSION N/A 08/18/2016   Procedure: TRANSESOPHAGEAL ECHOCARDIOGRAM (TEE);  Surgeon: Lewayne Bunting, MD;  Location: [facility] ENDOSCOPY;  Service: Cardiovascular;  Laterality: N/A;  . TEE WITHOUT CARDIOVERSION N/A 08/25/2016   Procedure: TRANSESOPHAGEAL ECHOCARDIOGRAM (TEE);  Surgeon: Purcell Nails, MD;  [facility] OR;  Service: Open Heart Surgery;  Laterality: N/A;  . TUBAL LIGATION       Current Outpatient Medications  Medication Sig Dispense Refill  . acetaminophen (TYLENOL) 325 MG tablet Take 650 mg by mouth every 6 (six) hours as needed for moderate pain.     Marland Kitchen aspirin EC 81 MG tablet Take 81 mg by mouth daily.    Marland Kitchen atorvastatin (LIPITOR) 80 MG tablet Take 1 tablet (80 mg total) by mouth daily. 30 tablet 11  . carvedilol (COREG) 6.25 MG tablet Take 6.25 mg by mouth 2 (two) times daily with a meal.    . clopidogrel (PLAVIX) 75 MG tablet Take 1 tablet (75 mg total) by mouth daily with breakfast. 30 tablet 11   No current facility-administered medications for this visit.     Allergies:   Brilinta [ticagrelor]; Other; Dye fdc red [red dye]; and Penicillins    Social History:  The patient  reports that she quit smoking about 16 months ago. Her smoking use included cigarettes. She smoked 0.50 packs per day. she has never used smokeless tobacco. She reports that she uses drugs. Drug: Marijuana. She reports that she does not  drink alcohol.   Family History:  The patient's family history includes Bladder Cancer in her mother; CAD (age of onset: 10) in her mother; CAD (age of onset: 82) in her father; Heart attack in her father and mother; Heart disease in her father and mother; Hypertension in her mother; Lung cancer in her mother; Stroke in her father and mother.    ROS:  Please see the history of present illness.   Otherwise, review of systems are positive for intermittent shortness of breath.   All other systems are reviewed and negative.    PHYSICAL EXAM: VS:  BP 114/68   Pulse 93   Ht 5\' 1"  (1.549 m)   Wt 133 lb 6.4 oz (60.5 kg)   SpO2 97%   BMI 25.21 kg/m  , BMI Body mass index is 25.21 kg/m. GEN: Well nourished, well developed, in no acute distress  HEENT: normal  Neck: no JVD, carotid bruits, or masses Cardiac: RRR; no murmurs, rubs, or gallops,no edema  Respiratory:  clear to auscultation bilaterally, normal work of breathing GI: soft, nontender, nondistended, + BS MS: no deformity or atrophy  Skin: warm and dry, no rash Neuro:  Strength and sensation are intact Psych: euthymic mood, full affect    Recent Labs: 08/26/2016: Magnesium 1.8 08/31/2016: ALT 15; B Natriuretic Peptide 1,085.4 09/26/2016: BUN 10; Creatinine, Ser 0.68; Hemoglobin 10.7; Platelets 256; Potassium 4.0; Sodium 139   Lipid Panel    Component Value Date/Time   CHOL 274 (H) 04/04/2016 0917   TRIG 96 04/04/2016 0917   HDL 42 04/04/2016 0917   CHOLHDL 6.5 (H) 04/04/2016 0917   CHOLHDL 6.2 (H) 02/08/2015 1211   VLDL 33 (H) 02/08/2015 1211   LDLCALC 213 (H) 04/04/2016 1610     Other studies Reviewed: Additional studies/ records that were reviewed today with results demonstrating: Labs controlled in 9/18.   ASSESSMENT AND PLAN:  1. CAD: no angina.  Continue aggressive secondary prevention.  She should feel better with cardiac rehab which she is starting in January. 2. MV repair: She needs SBE prophylaxis for any  dental procedures.  No signs of CHF on exam.  Ejection fraction moderately reduced at last echocardiogram in September 2018.  Continue carvedilol.  Low blood pressure has prevented the addition of ACE inhibitor for her chronic systolic heart failure.  She does appear euvolemic. 3. Hyperlipidemia: Her LDL has been above 200 in the past.  Continue atorvastatin.  Obtain labs from her PCP in [place].  SHe will get [place] his name.   4. Check BMet given that she is off of potassium.  Using prn Lasix.   Current medicines are reviewed at length with the patient today.  The patient concerns regarding her medicines were addressed.  The following changes have been made:  No change  Labs/ tests ordered today include: BMet No orders of the defined types were placed in this encounter.   Recommend 150 minutes/week of aerobic exercise Low fat, low carb, high fiber diet recommended  Disposition:   FU in 4 months   Signed, Lance Muss, MD  01/01/2017 2:22 PM    [facility] [address] [place], [place], [place]  [address] Phone: [phone]; Fax: [phone]

## 2017-01-02 LAB — BASIC METABOLIC PANEL
BUN/Creatinine Ratio: 11 (ref 9–23)
BUN: 9 mg/dL (ref 6–24)
CO2: 22 mmol/L (ref 20–29)
Calcium: 9.1 mg/dL (ref 8.7–10.2)
Chloride: 105 mmol/L (ref 96–106)
Creatinine, Ser: 0.85 mg/dL (ref 0.57–1.00)
GFR calc Af Amer: 92 mL/min/{1.73_m2} (ref >59)
GFR, EST NON AFRICAN AMERICAN: 80 mL/min/{1.73_m2} (ref >59)
GLUCOSE: 88 mg/dL (ref 65–99)
POTASSIUM: 4.3 mmol/L (ref 3.5–5.2)
SODIUM: 141 mmol/L (ref 134–144)

## 2017-01-21 ENCOUNTER — Inpatient Hospital Stay (HOSPITAL_COMMUNITY)
Admission: AD | Admit: 2017-01-21 | Discharge: 2017-01-26 | DRG: 227 | Disposition: A | Discharge status: Home / Self Care | Payer: Medicaid - Out of State | Source: Other Acute Inpatient Hospital | Attending: Internal Medicine | Admitting: Internal Medicine

## 2017-01-21 DIAGNOSIS — I2510 Atherosclerotic heart disease of native coronary artery without angina pectoris: Secondary | ICD-10-CM | POA: Diagnosis present

## 2017-01-21 DIAGNOSIS — Z8052 Family history of malignant neoplasm of bladder: Secondary | ICD-10-CM

## 2017-01-21 DIAGNOSIS — Z95810 Presence of automatic (implantable) cardiac defibrillator: Secondary | ICD-10-CM | POA: Diagnosis not present

## 2017-01-21 DIAGNOSIS — I5023 Acute on chronic systolic (congestive) heart failure: Secondary | ICD-10-CM | POA: Diagnosis present

## 2017-01-21 DIAGNOSIS — I340 Nonrheumatic mitral (valve) insufficiency: Secondary | ICD-10-CM | POA: Diagnosis present

## 2017-01-21 DIAGNOSIS — I428 Other cardiomyopathies: Secondary | ICD-10-CM | POA: Diagnosis not present

## 2017-01-21 DIAGNOSIS — I70201 Unspecified atherosclerosis of native arteries of extremities, right leg: Secondary | ICD-10-CM | POA: Diagnosis present

## 2017-01-21 DIAGNOSIS — Z801 Family history of malignant neoplasm of trachea, bronchus and lung: Secondary | ICD-10-CM

## 2017-01-21 DIAGNOSIS — G8929 Other chronic pain: Secondary | ICD-10-CM | POA: Diagnosis present

## 2017-01-21 DIAGNOSIS — I110 Hypertensive heart disease with heart failure: Principal | ICD-10-CM | POA: Diagnosis present

## 2017-01-21 DIAGNOSIS — L299 Pruritus, unspecified: Secondary | ICD-10-CM | POA: Diagnosis present

## 2017-01-21 DIAGNOSIS — Z823 Family history of stroke: Secondary | ICD-10-CM

## 2017-01-21 DIAGNOSIS — I255 Ischemic cardiomyopathy: Secondary | ICD-10-CM | POA: Diagnosis present

## 2017-01-21 DIAGNOSIS — Z8249 Family history of ischemic heart disease and other diseases of the circulatory system: Secondary | ICD-10-CM

## 2017-01-21 DIAGNOSIS — I252 Old myocardial infarction: Secondary | ICD-10-CM

## 2017-01-21 DIAGNOSIS — I959 Hypotension, unspecified: Secondary | ICD-10-CM | POA: Diagnosis present

## 2017-01-21 DIAGNOSIS — E785 Hyperlipidemia, unspecified: Secondary | ICD-10-CM | POA: Diagnosis not present

## 2017-01-21 DIAGNOSIS — I447 Left bundle-branch block, unspecified: Secondary | ICD-10-CM | POA: Diagnosis present

## 2017-01-21 DIAGNOSIS — Z7902 Long term (current) use of antithrombotics/antiplatelets: Secondary | ICD-10-CM

## 2017-01-21 DIAGNOSIS — Z8673 Personal history of transient ischemic attack (TIA), and cerebral infarction without residual deficits: Secondary | ICD-10-CM

## 2017-01-21 DIAGNOSIS — Z87891 Personal history of nicotine dependence: Secondary | ICD-10-CM

## 2017-01-21 DIAGNOSIS — Z9889 Other specified postprocedural states: Secondary | ICD-10-CM

## 2017-01-21 DIAGNOSIS — Z955 Presence of coronary angioplasty implant and graft: Secondary | ICD-10-CM

## 2017-01-21 DIAGNOSIS — Z720 Tobacco use: Secondary | ICD-10-CM | POA: Diagnosis present

## 2017-01-21 DIAGNOSIS — Z880 Allergy status to penicillin: Secondary | ICD-10-CM

## 2017-01-21 DIAGNOSIS — Z9851 Tubal ligation status: Secondary | ICD-10-CM

## 2017-01-21 DIAGNOSIS — Z888 Allergy status to other drugs, medicaments and biological substances status: Secondary | ICD-10-CM

## 2017-01-21 DIAGNOSIS — Z7982 Long term (current) use of aspirin: Secondary | ICD-10-CM

## 2017-01-21 DIAGNOSIS — Z91041 Radiographic dye allergy status: Secondary | ICD-10-CM

## 2017-01-21 HISTORY — DX: Other cardiomyopathies: I42.8

## 2017-01-21 LAB — CBC WITH DIFFERENTIAL/PLATELET
BASOS ABS: 0 10*3/uL (ref 0.0–0.1)
BASOS PCT: 0 %
EOS ABS: 0.3 10*3/uL (ref 0.0–0.7)
EOS PCT: 4 %
HEMATOCRIT: 43 % (ref 36.0–46.0)
Hemoglobin: 13.4 g/dL (ref 12.0–15.0)
Lymphocytes Relative: 32 %
Lymphs Abs: 1.9 10*3/uL (ref 0.7–4.0)
MCH: 27.3 pg (ref 26.0–34.0)
MCHC: 31.2 g/dL (ref 30.0–36.0)
MCV: 87.6 fL (ref 78.0–100.0)
MONO ABS: 0.4 10*3/uL (ref 0.1–1.0)
MONOS PCT: 7 %
NEUTROS ABS: 3.4 10*3/uL (ref 1.7–7.7)
Neutrophils Relative %: 57 %
Platelets: 229 10*3/uL (ref 150–400)
RBC: 4.91 MIL/uL (ref 3.87–5.11)
RDW: 16.1 % — AB (ref 11.5–15.5)
WBC: 6 10*3/uL (ref 4.0–10.5)

## 2017-01-21 LAB — COMPREHENSIVE METABOLIC PANEL
ALBUMIN: 3.3 g/dL — AB (ref 3.5–5.0)
ALK PHOS: 89 U/L (ref 38–126)
ALT: 23 U/L (ref 14–54)
ANION GAP: 11 (ref 5–15)
AST: 32 U/L (ref 15–41)
BILIRUBIN TOTAL: 0.9 mg/dL (ref 0.3–1.2)
BUN: 19 mg/dL (ref 6–20)
CALCIUM: 9 mg/dL (ref 8.9–10.3)
CO2: 30 mmol/L (ref 22–32)
CREATININE: 1.04 mg/dL — AB (ref 0.44–1.00)
Chloride: 98 mmol/L — ABNORMAL LOW (ref 101–111)
GFR calc Af Amer: >60 mL/min (ref >60)
GFR calc non Af Amer: >60 mL/min (ref >60)
GLUCOSE: 101 mg/dL — AB (ref 65–99)
Potassium: 4 mmol/L (ref 3.5–5.1)
SODIUM: 139 mmol/L (ref 135–145)
TOTAL PROTEIN: 6.8 g/dL (ref 6.5–8.1)

## 2017-01-21 LAB — APTT: APTT: 28 s (ref 24–36)

## 2017-01-21 LAB — PROTIME-INR
INR: 1.03
PROTHROMBIN TIME: 13.4 s (ref 11.4–15.2)

## 2017-01-21 LAB — MAGNESIUM: Magnesium: 2 mg/dL (ref 1.7–2.4)

## 2017-01-21 LAB — BRAIN NATRIURETIC PEPTIDE: B Natriuretic Peptide: 322.4 pg/mL — ABNORMAL HIGH (ref 0.0–100.0)

## 2017-01-21 MED ORDER — CLOPIDOGREL BISULFATE 75 MG PO TABS
75.0000 mg | ORAL_TABLET | Freq: Every day | ORAL | Status: DC
  Administered 2017-01-22 – 2017-01-24 (×3): 75 mg via ORAL
  Filled 2017-01-21 (×3): qty 1

## 2017-01-21 MED ORDER — SODIUM CHLORIDE 0.9% FLUSH: 3.0000 mL | INTRAVENOUS | Status: DC | PRN

## 2017-01-21 MED ORDER — ONDANSETRON HCL 4 MG/2ML IJ SOLN
4.0000 mg | Freq: Four times a day (QID) | INTRAMUSCULAR | Status: DC | PRN
  Administered 2017-01-25 (×2): 4 mg via INTRAVENOUS
  Filled 2017-01-21 (×2): qty 2

## 2017-01-21 MED ORDER — FUROSEMIDE 40 MG PO TABS
40.0000 mg | ORAL_TABLET | Freq: Two times a day (BID) | ORAL | Status: DC
  Administered 2017-01-22: 40 mg via ORAL
  Filled 2017-01-21: qty 1

## 2017-01-21 MED ORDER — ENOXAPARIN SODIUM 40 MG/0.4ML ~~LOC~~ SOLN
40.0000 mg | Freq: Every day | SUBCUTANEOUS | Status: DC
  Administered 2017-01-22 – 2017-01-23 (×2): 40 mg via SUBCUTANEOUS
  Filled 2017-01-21 (×2): qty 0.4

## 2017-01-21 MED ORDER — SACUBITRIL-VALSARTAN 24-26 MG PO TABS
1.0000 | ORAL_TABLET | Freq: Two times a day (BID) | ORAL | Status: DC
  Administered 2017-01-21 – 2017-01-22 (×2): 1 via ORAL
  Filled 2017-01-21 (×2): qty 1

## 2017-01-21 MED ORDER — ACETAMINOPHEN 325 MG PO TABS: 650.0000 mg | ORAL_TABLET | ORAL | Status: DC | PRN

## 2017-01-21 MED ORDER — ASPIRIN EC 81 MG PO TBEC
81.0000 mg | DELAYED_RELEASE_TABLET | Freq: Every day | ORAL | Status: DC
  Administered 2017-01-22 – 2017-01-26 (×5): 81 mg via ORAL
  Filled 2017-01-21 (×5): qty 1

## 2017-01-21 MED ORDER — CARVEDILOL 6.25 MG PO TABS
6.2500 mg | ORAL_TABLET | Freq: Two times a day (BID) | ORAL | Status: DC
  Administered 2017-01-23 – 2017-01-26 (×6): 6.25 mg via ORAL
  Filled 2017-01-21 (×8): qty 1

## 2017-01-21 MED ORDER — SODIUM CHLORIDE 0.9 % IV SOLN: 250.0000 mL | INTRAVENOUS | Status: DC | PRN

## 2017-01-21 MED ORDER — SODIUM CHLORIDE 0.9% FLUSH
3.0000 mL | Freq: Two times a day (BID) | INTRAVENOUS | Status: DC
  Administered 2017-01-21 – 2017-01-25 (×9): 3 mL via INTRAVENOUS

## 2017-01-21 MED ORDER — ATORVASTATIN CALCIUM 80 MG PO TABS
80.0000 mg | ORAL_TABLET | Freq: Every day | ORAL | Status: DC
  Administered 2017-01-22 – 2017-01-26 (×5): 80 mg via ORAL
  Filled 2017-01-21 (×5): qty 1

## 2017-01-21 NOTE — Progress Notes (Signed)
Falk-Martin with cardiology paged the following at 2105:  "[id number]:Mohamed, Markesha: arrived from [place]. VS stable and charted. patient without complaints. Needs admission orders. Thank you!"

## 2017-01-21 NOTE — H&P (Signed)
History and Physical   Patient ID: Lindsay Camacho, MRN: [id number], DOB: November 29, 1965   Date of Encounter: 01/21/2017, 9:56 PM  Primary Care Provider: No primary care provider on file. Cardiologist: Lance Muss  Chief Complaint: SOB/DOE (initial complaint in [place]); transfer to [facility] for consideration for Lifevest/ICD   History of Present Illness: Lindsay Camacho is a 52 y.o. female  with a h/o CAD s/plateral wall MI in 2015treated with PCI to the CXF. She had a repeat cath in 09/2014 showing patent stent. She unfortunately had a RP bleed post cath and required vascular surgery. She had another R/LHC prior to valve surgery 08/21/16 that showed patent CFX stent with minal restenosis + mild nonobstructive CAD in the RCA and LAD. She also has h/o chronic systolic HF w/ EF of 35-40%, severe MR and tobacco abuse.   She was recently admitted and underwent minimally invasive mitral valve repair using 26mm ring annuloplasty, per Dr. Cornelius Moras, for severe symptomatic MR. Surgery was 08/25/16. Post op recovery was fairly uneventfully.     She was admitted in [place], [place] 01-16-17 for worsening SOB/DOE. She was diuresed w/ IV lasix, which was subsequently changed to PO on 01-18-17. She has been on PO dose since 1-17 and says she now feels like her breathing is at baseline. Notes from [place] indicate that the patient has been clinically "euvolemic" since 01-18-17. TTE was repeated in [place] which showed worsening of LVEF to 20-25% per the report sent in the chart. B/c this is down from 08/2016 at which time EF was 35-40%, pt was transferred here for consideration of LifeVest/ICD.   Past Medical History:  Diagnosis Date  . Acute myocardial infarction of other lateral wall, initial episode of care 01/2013   DES CFX  . Aortoiliac occlusive disease (HCC) 08/22/2016  . CAD (coronary artery disease) 11/13/2011   50% ? proximal LCx stenosis per Cath at [facility]  . Chest pain, atypical, muscular skelatal   01/09/2014  .  Dyspnea   . Heart failure (HCC)   . HLD (hyperlipidemia)   . Hypertension   . Intermediate coronary syndrome (HCC)   . LBBB (left bundle branch block)   . Mitral regurgitation   . PVD (peripheral vascular disease) (HCC)    Right CFA stenosis per report on 11/14/2011  . 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/25/2016   Edwards McArthy-Adams IMR ETLogix ring annuloplasty (Model 4100, Serial [id number], size 26) placed via right mini thoracotomy approach  . Stenosis of left subclavian artery (HCC) 08/22/2016  . Stroke [facility])    tia with no deficits    Past Surgical History:  Procedure Laterality Date  . APPENDECTOMY    . CARDIAC CATHETERIZATION  04/14/2013   Non-obstructive disease, patent CFX stent  . CARDIAC CATHETERIZATION N/A 09/21/2014   Procedure: Left Heart Cath and Coronary Angiography;  Surgeon: Lennette Bihari, MD;  Location: [facility] INVASIVE CV LAB;  Service: Cardiovascular;  Laterality: N/A;  . CORONARY ANGIOPLASTY WITH STENT PLACEMENT  01/17/2013   mild disease except 99% CFX, rx with  2.5 x 28 Alpine drug-eluting stent   . GROIN DEBRIDEMENT Right 04/14/2013   Procedure: Emergency Evacuation of Retroperitoneal Hematoma and Repair Right External Iliac Artery Pseudoaneurysm    ;  Surgeon: Sherren Kerns, MD;  [facility] OR;  Service: Vascular;  Laterality: Right;  . LEFT HEART CATHETERIZATION WITH CORONARY ANGIOGRAM N/A 01/18/2013   Procedure: LEFT HEART CATHETERIZATION WITH CORONARY ANGIOGRAM;  Surgeon: Corky Crafts, MD;  Location: [facility] CATH LAB;  Service: Cardiovascular;  Laterality: N/A;  . LEFT HEART CATHETERIZATION WITH CORONARY ANGIOGRAM N/A 04/14/2013   Procedure: LEFT HEART CATHETERIZATION WITH CORONARY ANGIOGRAM;  Surgeon: Corky Crafts, MD;  Location: [facility] CATH LAB;  Service: Cardiovascular;  Laterality: N/A;  . MITRAL VALVE REPAIR Right 08/25/2016   Procedure: MINIMALLY INVASIVE MITRAL VALVE REPAIR;  Surgeon: Purcell Nails, MD;  Location[facility] OR;  Service: Open  Heart Surgery;  Laterality: Right;  . MULTIPLE EXTRACTIONS WITH ALVEOLOPLASTY N/A 08/23/2016   Procedure: Extraction of tooth #'s 17, 20-23, 26-29 with alveoloplasty;  Surgeon: [name], DDS;  Location: [facility] OR;  Service: Oral Surgery;  Laterality: N/A;  . PERCUTANEOUS CORONARY STENT INTERVENTION (PCI-S)  01/18/2013   Procedure: PERCUTANEOUS CORONARY STENT INTERVENTION (PCI-S);  Surgeon: Corky Crafts, MD;  Location: [facility] CATH LAB;  Service: Cardiovascular;;  . RIGHT/LEFT HEART CATH AND CORONARY ANGIOGRAPHY N/A 08/21/2016   Procedure: RIGHT/LEFT HEART CATH AND CORONARY ANGIOGRAPHY;  Surgeon: Kathleene Hazel, MD;  [facility]: [facility] INVAS[facility] CV LAB;  Service: Cardiovascular;  Laterality: N/A;  . TEE WITHOUT CARDIOVERSION N/A 03/06/2014   Procedure: TRANSESOPHAGEAL ECHOCARDIOGRAM (TEE);  Surgeon: Lars Masson, MD;  Location: [facility] ENDOSCOPY;  Service: Cardiovascular;  Laterality: N/A;  . TEE WITHOUT CARDIOVERSION N/A 08/18/2016   Procedure: TRANSESOPHAGEAL ECHOCARDIOGRAM (TEE);  Surgeon: Lewayne Bunting, MD;  Location: [facility] ENDOSCOPY;  Service: Cardiovascular;  Laterality: N/A;  . TEE WITHOUT CARDIOVERSION N/A 08/25/2016   Procedure: TRANSESOPHAGEAL ECHOCARDIOGRAM (TEE);  Surgeon: Purcell Nails, MD;  Location: [facility] OR;  Service: Open Heart Surgery;  Laterality: N/A;  . TUBAL LIGATION       Prior to Admission medications   Medication Sig Start Date End Date Taking? Authorizing Provider  acetaminophen (TYLENOL) 325 MG tablet Take 650 mg by mouth every 6 (six) hours as needed for moderate pain.     [provider]  aspirin EC 81 MG tablet Take 81 mg by mouth daily.    [provider]  atorvastatin (LIPITOR) 80 MG tablet Take 1 tablet (80 mg total) by mouth daily. 09/19/16   Robbie Lis M, PA-C  carvedilol (COREG) 6.25 MG tablet Take 6.25 mg by mouth 2 (two) times daily with a meal.    [provider]  clopidogrel (PLAVIX) 75 MG tablet Take 1 tablet (75 mg  total) by mouth daily with breakfast. 09/19/16   Allayne Butcher, PA-C     Allergies: Allergies  Allergen Reactions  . Brilinta [Ticagrelor] Shortness Of Breath  . Other Other (See Comments)    BP med (unsure of name) BP bottomed out  . Dye Fdc Red [Red Dye] Itching and Other (See Comments)    Allergic to dye that is used in nuclear stress test. Pt sates makes her feel like her blood is boiling and itching  . Penicillins Rash    Has patient had a PCN reaction causing immediate rash, facial/tongue/throat swelling, SOB or lightheadedness with hypotension: No Has patient had a PCN reaction causing severe rash involving mucus membranes or skin necrosis: Yes Has patient had a PCN reaction that required hospitalization: No Has patient had a PCN reaction occurring within the last 10 years: No If all of the above answers are "NO", then may proceed with Cephalosporin use.     Social History:  The patient  reports that she quit smoking about 17 months ago. Her smoking use included cigarettes. She smoked 0.50 packs per day. she has never used smokeless tobacco. She reports that she uses drugs. Drug:  Marijuana. She reports that she does not drink alcohol.   Family History:  The patient's family history includes Bladder Cancer in her mother; CAD (age of onset: 34) in her mother; CAD (age of onset: 56) in her father; Heart attack in her father and mother; Heart disease in her father and mother; Hypertension in her mother; Lung cancer in her mother; Stroke in her father and mother.   ROS:  Please see the history of present illness.     All other systems reviewed and negative.   Vital Signs: Blood pressure 120/70, pulse 80, temperature 98 F (36.7 C), resp. rate 18, height 5\' 1"  (1.549 m), weight 58.5 kg (129 lb), SpO2 99 %.  PHYSICAL EXAM: General:  Well nourished, well developed, in no acute distress HEENT: normal Lymph: no adenopathy Neck: no JVD Endocrine:  No thryomegaly Vascular: No  carotid bruits; DP pulses 2+ bilaterally Cardiac:  normal S1, S2; RRR; 2/6 sys murmur at LLSB  Lungs:  clear to auscultation bilaterally, no wheezing, rhonchi or rales  Abd: soft, nontender, no hepatomegaly  Ext: no edema Musculoskeletal:  No deformities, BUE and BLE strength normal and equal Skin: warm and dry  Neuro:  CNs 2-12 intact, no focal abnormalities noted Psych:  Normal affect   EKG:  01-16-17 ST, non-specific IVCD, LAE  Labs:   Lab Results  Component Value Date   WBC 4.7 09/26/2016   HGB 10.7 (L) 09/26/2016   HCT 34.2 (L) 09/26/2016   MCV 91.7 09/26/2016   PLT 256 09/26/2016   No results for input(s): NA, K, CL, CO2, BUN, CREATININE, CALCIUM, PROT, BILITOT, ALKPHOS, ALT, AST, GLUCOSE in the last 168 hours.  Invalid input(s): LABALBU No results for input(s): CKTOTAL, CKMB, TROPONINI in the last 72 hours. Troponin (Point of Care Test) No results for input(s): TROPIPOC in the last 72 hours.  Lab Results  Component Value Date   CHOL 274 (H) 04/04/2016   HDL 42 04/04/2016   LDLCALC 213 (H) 04/04/2016   TRIG 96 04/04/2016   Lab Results  Component Value Date   DDIMER 0.71 (H) 03/10/2014    Radiology/Studies:  No results found.   TTE 09-26-16: Left ventricle: The cavity size was normal. Wall thickness was   increased in a pattern of mild LVH. Systolic function was   moderately reduced. The estimated ejection fraction was in the   range of 35% to 40%. Diffuse hypokinesis. - Aortic valve: There was moderate regurgitation. Valve area (VTI):   1.76 cm^2. Valve area (Vmax): 1.56 cm^2. - Mitral valve: There is a 26 mm Edwards McArthy-Adams anular ring   present at the MV anulus. There was no evidence for stenosis.   There was no significant regurgitation. Mean gradient (D): 3 mm   Hg. - Left atrium: The atrium was mildly dilated. - Pulmonary arteries: PA peak pressure: 32 mm Hg (S). PASP is   borderline elevated. - Technically adequate study.  TTE from [facility]  dated 01-17-17 EF 20-25%, global HK Mild MR, mild AI, severe TR, tr PI Tr pericardial effusion  LHC 08-21-16:  Ost RCA to Prox RCA lesion, 10 %stenosed.  Dist RCA lesion, 30 %stenosed.  Prox Cx to Mid Cx lesion, 10 %stenosed.  Prox LAD to Mid LAD lesion, 30 %stenosed.   1. Patent stent in the Circumflex artery with minimal restenosis 2. Mild non-obstructive disease in the RCA and LAD (of note, there was spasm in the RCA with catheter engagement that resolved with SL NTG)   ASSESSMENT AND  PLAN:   1. NICM: EF reportedly down to 20-25%. Will repeat TTE. Pt is clinically euvolemic after being hospitalized and diuresed in [place] x 5 days. Will cont PO lasix; she has been on 40mg  bid. Decision to proceed w/ LifeVest can be made once echo has been done. She has been on coreg which I will re-order. It does not appear she has been on ACEI or ARB. No ACEi allergy documented. Renal function appears to be OK (Cr 1.18 in [place]). I am going to start entresto 24/26, assuming LVEF is truly down.  2. Valvular heart disease: s/p MV repair and doing better now symptomatically. Mild MR on echo from [place]  3. CAD: h/o PCI. Prior stent was patent on cath in august 2018. Currently no new/worsening anginal sx. Cont secondary prevention  4. Dyslipidemia: last LDL was quite high. She is on atorva 80. Will cont atorva for now and repeat FLP. if LDL remains significantly elevated, would strongly consider putting this patient on [id number].   Signed,  Precious Reel, MD  01/21/2017 9:56 PM

## 2017-01-22 ENCOUNTER — Observation Stay (HOSPITAL_BASED_OUTPATIENT_CLINIC_OR_DEPARTMENT_OTHER): Payer: Medicaid - Out of State

## 2017-01-22 ENCOUNTER — Encounter (HOSPITAL_COMMUNITY): Payer: Self-pay

## 2017-01-22 ENCOUNTER — Other Ambulatory Visit: Payer: Self-pay

## 2017-01-22 DIAGNOSIS — I110 Hypertensive heart disease with heart failure: Secondary | ICD-10-CM | POA: Diagnosis present

## 2017-01-22 DIAGNOSIS — Z823 Family history of stroke: Secondary | ICD-10-CM | POA: Diagnosis not present

## 2017-01-22 DIAGNOSIS — Z91041 Radiographic dye allergy status: Secondary | ICD-10-CM | POA: Diagnosis not present

## 2017-01-22 DIAGNOSIS — Z801 Family history of malignant neoplasm of trachea, bronchus and lung: Secondary | ICD-10-CM | POA: Diagnosis not present

## 2017-01-22 DIAGNOSIS — I952 Hypotension due to drugs: Secondary | ICD-10-CM | POA: Diagnosis not present

## 2017-01-22 DIAGNOSIS — I361 Nonrheumatic tricuspid (valve) insufficiency: Secondary | ICD-10-CM | POA: Diagnosis not present

## 2017-01-22 DIAGNOSIS — I447 Left bundle-branch block, unspecified: Secondary | ICD-10-CM | POA: Diagnosis present

## 2017-01-22 DIAGNOSIS — I509 Heart failure, unspecified: Secondary | ICD-10-CM | POA: Diagnosis present

## 2017-01-22 DIAGNOSIS — Z8673 Personal history of transient ischemic attack (TIA), and cerebral infarction without residual deficits: Secondary | ICD-10-CM | POA: Diagnosis not present

## 2017-01-22 DIAGNOSIS — Z7982 Long term (current) use of aspirin: Secondary | ICD-10-CM | POA: Diagnosis not present

## 2017-01-22 DIAGNOSIS — Z888 Allergy status to other drugs, medicaments and biological substances status: Secondary | ICD-10-CM | POA: Diagnosis not present

## 2017-01-22 DIAGNOSIS — I252 Old myocardial infarction: Secondary | ICD-10-CM | POA: Diagnosis not present

## 2017-01-22 DIAGNOSIS — I428 Other cardiomyopathies: Secondary | ICD-10-CM | POA: Diagnosis present

## 2017-01-22 DIAGNOSIS — Z880 Allergy status to penicillin: Secondary | ICD-10-CM | POA: Diagnosis not present

## 2017-01-22 DIAGNOSIS — I5022 Chronic systolic (congestive) heart failure: Secondary | ICD-10-CM | POA: Diagnosis not present

## 2017-01-22 DIAGNOSIS — I5023 Acute on chronic systolic (congestive) heart failure: Secondary | ICD-10-CM | POA: Diagnosis present

## 2017-01-22 DIAGNOSIS — E785 Hyperlipidemia, unspecified: Secondary | ICD-10-CM | POA: Diagnosis present

## 2017-01-22 DIAGNOSIS — Z8052 Family history of malignant neoplasm of bladder: Secondary | ICD-10-CM | POA: Diagnosis not present

## 2017-01-22 DIAGNOSIS — Z87891 Personal history of nicotine dependence: Secondary | ICD-10-CM | POA: Diagnosis not present

## 2017-01-22 DIAGNOSIS — Z7902 Long term (current) use of antithrombotics/antiplatelets: Secondary | ICD-10-CM | POA: Diagnosis not present

## 2017-01-22 DIAGNOSIS — I959 Hypotension, unspecified: Secondary | ICD-10-CM | POA: Diagnosis present

## 2017-01-22 DIAGNOSIS — Z955 Presence of coronary angioplasty implant and graft: Secondary | ICD-10-CM | POA: Diagnosis not present

## 2017-01-22 DIAGNOSIS — I5021 Acute systolic (congestive) heart failure: Secondary | ICD-10-CM | POA: Diagnosis not present

## 2017-01-22 DIAGNOSIS — G8929 Other chronic pain: Secondary | ICD-10-CM | POA: Diagnosis present

## 2017-01-22 DIAGNOSIS — Z9851 Tubal ligation status: Secondary | ICD-10-CM | POA: Diagnosis not present

## 2017-01-22 DIAGNOSIS — Z8249 Family history of ischemic heart disease and other diseases of the circulatory system: Secondary | ICD-10-CM | POA: Diagnosis not present

## 2017-01-22 DIAGNOSIS — I70201 Unspecified atherosclerosis of native arteries of extremities, right leg: Secondary | ICD-10-CM | POA: Diagnosis present

## 2017-01-22 DIAGNOSIS — L299 Pruritus, unspecified: Secondary | ICD-10-CM | POA: Diagnosis present

## 2017-01-22 DIAGNOSIS — I2510 Atherosclerotic heart disease of native coronary artery without angina pectoris: Secondary | ICD-10-CM | POA: Diagnosis present

## 2017-01-22 LAB — BASIC METABOLIC PANEL
ANION GAP: 9 (ref 5–15)
BUN: 18 mg/dL (ref 6–20)
CALCIUM: 9 mg/dL (ref 8.9–10.3)
CO2: 28 mmol/L (ref 22–32)
Chloride: 104 mmol/L (ref 101–111)
Creatinine, Ser: 0.83 mg/dL (ref 0.44–1.00)
GFR calc non Af Amer: >60 mL/min (ref >60)
Glucose, Bld: 98 mg/dL (ref 65–99)
Potassium: 3.9 mmol/L (ref 3.5–5.1)
Sodium: 141 mmol/L (ref 135–145)

## 2017-01-22 LAB — ECHOCARDIOGRAM COMPLETE
HEIGHTINCHES: 61 in
WEIGHTICAEL: 2064 [oz_av]

## 2017-01-22 LAB — TSH: TSH: 1.7 u[IU]/mL (ref 0.350–4.500)

## 2017-01-22 MED ORDER — HYDROCORTISONE 1 % EX CREA
1.0000 "application " | TOPICAL_CREAM | Freq: Three times a day (TID) | CUTANEOUS | Status: DC | PRN
  Administered 2017-01-26: 1 via TOPICAL
  Filled 2017-01-22 (×2): qty 28

## 2017-01-22 MED ORDER — SODIUM CHLORIDE 0.9 % IV BOLUS (SEPSIS)
250.0000 mL | Freq: Once | INTRAVENOUS | Status: AC
  Administered 2017-01-22: 250 mL via INTRAVENOUS

## 2017-01-22 NOTE — Progress Notes (Signed)
   01/22/17 1200  Clinical Encounter Type  Visited With Patient  Visit Type Initial  Consult/Referral To Nurse  Spiritual Encounters  Spiritual Needs Brochure  Stress Factors  Patient Stress Factors None identified  Family Stress Factors None identified     Patient was in his bed deep asleep. No family on-site. I promised to check back on him while awake or refer him to the next chaplain.  Stepheni Cameron a Water quality scientist, [employer]

## 2017-01-22 NOTE — Progress Notes (Signed)
Progress Note  Patient Name: Lindsay Camacho Date of Encounter: 01/22/2017  Primary Cardiologist: Lance Muss, MD   Subjective   Some chest pain last pm but the same pain she has had since surgery.  no SOB.    Inpatient Medications    Scheduled Meds: . aspirin EC  81 mg Oral Daily  . atorvastatin  80 mg Oral Daily  . carvedilol  6.25 mg Oral BID WC  . clopidogrel  75 mg Oral Q breakfast  . enoxaparin (LOVENOX) injection  40 mg Subcutaneous Daily  . furosemide  40 mg Oral BID  . sacubitril-valsartan  1 tablet Oral BID  . sodium chloride flush  3 mL Intravenous Q12H   Continuous Infusions: . sodium chloride     PRN Meds: sodium chloride, acetaminophen, ondansetron (ZOFRAN) IV, sodium chloride flush   Vital Signs    Vitals:   01/21/17 2054 01/21/17 2200 01/22/17 0139 01/22/17 0507  BP: 120/70   97/66  Pulse: 80 74  82  Resp: 18   18  Temp: 98 F (36.7 C)   98 F (36.7 C)  TempSrc:   Oral Oral  SpO2: 99% 99%  99%  Weight: 129 lb (58.5 kg)   129 lb (58.5 kg)  Height: 5\' 1"  (1.549 m)       Intake/Output Summary (Last 24 hours) at 01/22/2017 0811 Last data filed at 01/22/2017 0100 Gross per 24 hour  Intake 240 ml  Output 450 ml  Net -210 ml   Filed Weights   01/21/17 2054 01/22/17 0507  Weight: 129 lb (58.5 kg) 129 lb (58.5 kg)    Telemetry    SR rare PAC - Personally Reviewed  ECG    SR with LBBB no acute changes - Personally Reviewed  Physical Exam   GEN: No acute distress.   Neck: No JVD Cardiac: RRR, no murmurs, rubs, or gallops.  Respiratory: Clear to auscultation bilaterally. GI: Soft, nontender, non-distended  MS: No edema; No deformity. Neuro:  Nonfocal  Psych: Normal affect   Labs    Chemistry Recent Labs  Lab 01/21/17 2256 01/22/17 0335  NA 139 141  K 4.0 3.9  CL 98* 104  CO2 30 28  GLUCOSE 101* 98  BUN 19 18  CREATININE 1.04* 0.83  CALCIUM 9.0 9.0  PROT 6.8  --   ALBUMIN 3.3*  --   AST 32  --   ALT 23  --     ALKPHOS 89  --   BILITOT 0.9  --   GFRNONAA >60 >60  GFRAA >60 >60  ANIONGAP 11 9     Hematology Recent Labs  Lab 01/21/17 2256  WBC 6.0  RBC 4.91  HGB 13.4  HCT 43.0  MCV 87.6  MCH 27.3  MCHC 31.2  RDW 16.1*  PLT 229    Cardiac EnzymesNo results for input(s): TROPONINI in the last 168 hours. No results for input(s): TROPIPOC in the last 168 hours.   BNP Recent Labs  Lab 01/21/17 2304  BNP 322.4*     DDimer No results for input(s): DDIMER in the last 168 hours.   Radiology    No results found.  Cardiac Studies    TTE 09-26-16: Left ventricle: The cavity size was normal. Wall thickness was increased in a pattern of mild LVH. Systolic function was moderately reduced. The estimated ejection fraction was in the range of 35% to 40%. Diffuse hypokinesis. - Aortic valve: There was moderate regurgitation. Valve area (VTI): 1.76 cm^2. Valve  area (Vmax): 1.56 cm^2. - Mitral valve: There is a 26 mm Edwards McArthy-Adams anular ring present at the MV anulus. There was no evidence for stenosis. There was no significant regurgitation. Mean gradient (D): 3 mm Hg. - Left atrium: The atrium was mildly dilated. - Pulmonary arteries: PA peak pressure: 32 mm Hg (S). PASP is borderline elevated. - Technically adequate study.  TTE from [facility] dated 01-17-17 EF 20-25%, global HK Mild MR, mild AI, severe TR, tr PI Tr pericardial effusion  LHC 08-21-16:  Ost RCA to Prox RCA lesion, 10 %stenosed.  Dist RCA lesion, 30 %stenosed.  Prox Cx to Mid Cx lesion, 10 %stenosed.  Prox LAD to Mid LAD lesion, 30 %stenosed.  1. Patent stent in the Circumflex artery with minimal restenosis 2. Mild non-obstructive disease in the RCA and LAD (of note, there was spasm in the RCA with catheter engagement that resolved with SL NTG)      Patient Profile     52 y.o. female  with ah/o CAD s/plateral wall MI in 2015treated with PCI to the CXF. She had a repeat  cath in 09/2014 showing patent stent. She unfortunately had a RP bleed post cath and required vascular surgery. She had another R/LHC prior to valve surgery 08/21/16 that showed patent CFX stent with minal restenosis + mild nonobstructive CAD in the RCA and LAD. She also has h/o chronic systolic HF w/ EF of 35-40%, severe MR and tobacco abuse.  Minimally invasive MV Repair  08/25/16.   Transferred from [place] with decrease in EF to 20-25% and was admitted with CHF-she had been SOB for about 1 week.    Assessment & Plan    NICM EF 20-25% - to have TTE today.  She has chronic chest pain episodically since MV repair.   Chronic systolic HF, now euvolemic  BNP 322 on arrival here  Valvular heart disease with MV repair 08/2016--mild MR on echo from [facility]   CAD with hx PCI- LCX-patent in 2018 (placed 2015) no new chest pain  HLD on lipitor 80 -- recheck lipids in AM    For questions or updates, please contact [facility] Please consult [email or web address] for contact info under Cardiology/STEMI.      Signed, Nada Boozer, NP  01/22/2017, 8:11 AM    History and all data above reviewed.  Patient examined.  She is not SOB and has no pain.  She is "cold"   I agree with the findings as above.  The patient exam reveals COR:RRR  ,  Lungs: clear  ,  Abd: Positive bowel sounds, no rebound no guarding, Ext No edema  .  All available labs, radiology testing, previous records reviewed. Agree with documented assessment and plan. Cardiomyopathy:  She is not on OMT which needs to be in place before consideration of ICD.  Started Entresto (she tolerated ACE in the past) and will repeat echo.  Unlikely to need LifeVest before discharge.   Rollene Rotunda  8:33 AM  01/22/2017

## 2017-01-22 NOTE — Progress Notes (Signed)
Nada Boozer with Cards paged the following at 1955:  "6E22:[place],R:Pt. w/ 2nd episode of hypotension 78/61(68). previously received 250cc bolus. Pt. asymptomatic. CHF pt. lungs clear at present"

## 2017-01-22 NOTE — Care Management Note (Addendum)
Case Management Note  Patient Details  Name: Lindsay Camacho MRN: [id number] Date of Birth: 1965-12-06  Subjective/Objective:  Pt presented for CHF from [place]. PTA from home with family support. Pt has [facility] faxed to admitting. Pt has PCP Darlis Loan @ [facility] [address] [place], [place] [address]. [facility] has a pharmacy onsite and per pt her medications are no more than $4.00. Hopefully Sherryll Burger will be in stock. CM will call on 01-22-17 to see if in stock.           Action/Plan: CM did call [facility] and the office was closed due to [facility] Holiday. No further needs from CM at this time.   Expected Discharge Date:  01/23/17               Expected Discharge Plan:  Home/Self Care  In-House Referral:  NA  Discharge planning Services  CM Consult, Medication Assistance  Post Acute Care Choice:    Choice offered to:  NA  DME Arranged:  N/A DME Agency:  NA  HH Arranged:  NA HH Agency:  NA  Status of Service:  Completed, signed off  If discussed at Long Length of Stay Meetings, dates discussed:    Additional Comments: 1212 01-25-17 Tomi Bamberger, RN, BSN [phone] Pt has not been able to tolerate Entresto will not need at d/c. Post ICD 01-24-17. No further needs from CM at this time.  Gala Lewandowsky, RN 01/22/2017, 12:17 PM

## 2017-01-22 NOTE — Progress Notes (Signed)
  Echocardiogram 2D Echocardiogram has been performed.  Lindsay Camacho 01/22/2017, 3:46 PM

## 2017-01-22 NOTE — Progress Notes (Signed)
Called by RN for drop in BP to 77, lasix was rec'd this AM --hold entresto , coreg today and will give 250 cc NS now.

## 2017-01-23 DIAGNOSIS — I952 Hypotension due to drugs: Secondary | ICD-10-CM

## 2017-01-23 DIAGNOSIS — I5023 Acute on chronic systolic (congestive) heart failure: Secondary | ICD-10-CM

## 2017-01-23 LAB — BASIC METABOLIC PANEL
ANION GAP: 13 (ref 5–15)
BUN: 13 mg/dL (ref 6–20)
CO2: 24 mmol/L (ref 22–32)
Calcium: 9.3 mg/dL (ref 8.9–10.3)
Chloride: 104 mmol/L (ref 101–111)
Creatinine, Ser: 0.84 mg/dL (ref 0.44–1.00)
GFR calc Af Amer: >60 mL/min (ref >60)
GLUCOSE: 77 mg/dL (ref 65–99)
POTASSIUM: 4.5 mmol/L (ref 3.5–5.1)
Sodium: 141 mmol/L (ref 135–145)

## 2017-01-23 LAB — LIPID PANEL
CHOL/HDL RATIO: 5.1 ratio
Cholesterol: 158 mg/dL (ref 0–200)
HDL: 31 mg/dL — AB (ref >40)
LDL Cholesterol: 102 mg/dL — ABNORMAL HIGH (ref 0–99)
Triglycerides: 126 mg/dL (ref <150)
VLDL: 25 mg/dL (ref 0–40)

## 2017-01-23 NOTE — Consult Note (Signed)
Cardiology Consultation:   Patient ID: BRIAHNA MAHDAVI; [id number]; 04-17-1965   Admit date: 01/21/2017 Date of Consult: 01/23/2017  Primary Care Provider: No primary care provider on file. Primary Cardiologist: Lance Muss, MD    Patient Profile:   Lindsay Camacho is a 53 y.o. female with a hx of CAD (MI w/PCT to Cx in 2015), HTN, HLD, severe MR>> MV repair 08/25/2016 (required dental extractions prior), LBBB, chronic CHF, and it seem since her MV repair a chronic chest wall discomfort, who is being seen today for the evaluation of CRT-D implant at the request of Dr. Antoine Poche.  History of Present Illness:   Ms. Caya was initially sought attention for c/o SOB/DOE in [place], she was admitted 01/16/17 and diuresed transitioned to PO lasix 01/18/17, and echo done there noted reductionin her LVEF from her baseline and she was transferred to [facility] for further care and management.   Upon admission here she was started on Entresto to optimize her meds, and initially seemed to tolerate though yesterday afternoon became hypotensive and meds held.  In review of notes, BP has not tolerated attempts at even low dose ACE.  LABS K+ 4.5 BUN/Creat 13/0.84 WBC 6.0 H/H 13/43 Plts 229 TSH 1.700  The patient is feeling better, reports breathing easy.  She mentions that until about 2-3 weeks ago she had really been feeling well, minimal exertional intolerances.   She noticed that she went from being able to do all of her ADL's to having no energy, increasingly SOB even to at rest in the last 3 weeks.   She denies any kind of CP that reminds her of her MI in 2015, has had aching in her chest on/off sine her MV repair,  No dizziness, near syncope or syncope.   She says it felt like something sucked the energy right out of her.  She was going to cardiac rehab and doing well, then one last week had difficulty finishing, feeling very weak and SOB, and when she went this last time she was SOB walking in and they  referred her to the ER, admitted. (this admission).  Past Medical History:  Diagnosis Date  . Acute myocardial infarction of other lateral wall, initial episode of care 01/2013   DES CFX  . Aortoiliac occlusive disease (HCC) 08/22/2016  . CAD (coronary artery disease) 11/13/2011   50% ? proximal LCx stenosis per Cath at [facility]  . Chest pain, atypical, muscular skelatal   01/09/2014  . Dyspnea   . Heart failure (HCC)   . HLD (hyperlipidemia)   . Hypertension   . Intermediate coronary syndrome (HCC)   . LBBB (left bundle branch block)   . Mitral regurgitation   . PVD (peripheral vascular disease) (HCC)    Right CFA stenosis per report on 11/14/2011  . 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/25/2016   Edwards McArthy-Adams IMR ETLogix ring annuloplasty (Model 4100, Serial [id number], size 26) placed via right mini thoracotomy approach  . Stenosis of left subclavian artery (HCC) 08/22/2016  . Stroke [facility])    tia with no deficits    Past Surgical History:  Procedure Laterality Date  . APPENDECTOMY    . CARDIAC CATHETERIZATION  04/14/2013   Non-obstructive disease, patent CFX stent  . CARDIAC CATHETERIZATION N/A 09/21/2014   Procedure: Left Heart Cath and Coronary Angiography;  Surgeon: Lennette Bihari, MD;  Location: [facility] INVASIVE CV LAB;  Service: Cardiovascular;  Laterality: N/A;  . CORONARY ANGIOPLASTY WITH STENT PLACEMENT  01/17/2013  mild disease except 99% CFX, rx with  2.5 x 28 Alpine drug-eluting stent   . GROIN DEBRIDEMENT Right 04/14/2013   Procedure: Emergency Evacuation of Retroperitoneal Hematoma and Repair Right External Iliac Artery Pseudoaneurysm    ;  Surgeon: Sherren Kerns, MD;  [facility] OR;  Service: Vascular;  Laterality: Right;  . LEFT HEART CATHETERIZATION WITH CORONARY ANGIOGRAM N/A 01/18/2013   Procedure: LEFT HEART CATHETERIZATION WITH CORONARY ANGIOGRAM;  Surgeon: Corky Crafts, MD;  Location: [facility] CATH LAB;  Service: Cardiovascular;  Laterality: N/A;  .  LEFT HEART CATHETERIZATION WITH CORONARY ANGIOGRAM N/A 04/14/2013   Procedure: LEFT HEART CATHETERIZATION WITH CORONARY ANGIOGRAM;  Surgeon: Corky Crafts, MD;  Location: [facility] CATH LAB;  Service: Cardiovascular;  Laterality: N/A;  . MITRAL VALVE REPAIR Right 08/25/2016   Procedure: MINIMALLY INVASIVE MITRAL VALVE REPAIR;  Surgeon: Purcell Nails, MD;  Location: [facility] OR;  Service: Open Heart Surgery;  Laterality: Right;  . MULTIPLE EXTRACTIONS WITH ALVEOLOPLASTY N/A 08/23/2016   Procedure: Extraction of tooth #'s 17, 20-23, 26-29 with alveoloplasty;  Surgeon: [name], DDS;  Location: [facility] OR;  Service: Oral Surgery;  Laterality: N/A;  . PERCUTANEOUS CORONARY STENT INTERVENTION (PCI-S)  01/18/2013   Procedure: PERCUTANEOUS CORONARY STENT INTERVENTION (PCI-S);  Surgeon: Corky Crafts, MD;  [facility];  Service: Cardiovascular;;  . RIGHT/LEFT HEART CATH AND CORONARY ANGIOGRAPHY N/A 08/21/2016   Procedure: RIGHT/LEFT HEART CATH AND CORONARY ANGIOGRAPHY;  Surgeon: Kathleene Hazel, MD;  Location: [facility] INVASIVE CV LAB;  Service: Cardiovascular;  Laterality: N/A;  . TEE WITHOUT CARDIOVERSION N/A 03/06/2014   Procedure: TRANSESOPHAGEAL ECHOCARDIOGRAM (TEE);  Surgeon: Lars Masson, MD;  Location: [facility] ENDOSCOPY;  Service: Cardiovascular;  Laterality: N/A;  . TEE WITHOUT CARDIOVERSION N/A 08/18/2016   Procedure: TRANSESOPHAGEAL ECHOCARDIOGRAM (TEE);  Surgeon: Lewayne Bunting, MD;  Location: [facility] ENDOSCOPY;  Service: Cardiovascular;  Laterality: N/A;  . TEE WITHOUT CARDIOVERSION N/A 08/25/2016   Procedure: TRANSESOPHAGEAL ECHOCARDIOGRAM (TEE);  Surgeon: Purcell Nails, MD;  Location: [facility] OR;  Service: Open Heart Surgery;  Laterality: N/A;  . TUBAL LIGATION         Inpatient Medications: Scheduled Meds: . aspirin EC  81 mg Oral Daily  . atorvastatin  80 mg Oral Daily  . carvedilol  6.25 mg Oral BID WC  . clopidogrel  75 mg Oral Q breakfast  . enoxaparin (LOVENOX) injection   40 mg Subcutaneous Daily  . sodium chloride flush  3 mL Intravenous Q12H   Continuous Infusions: . sodium chloride     PRN Meds: sodium chloride, acetaminophen, hydrocortisone cream, ondansetron (ZOFRAN) IV, sodium chloride flush  Allergies:    Allergies  Allergen Reactions  . Aldomet [Methyldopa] Other (See Comments)    Severe hypotension  . Brilinta [Ticagrelor] Shortness Of Breath  . Other Other (See Comments)    Nuclear Stress Test Medication caused seizures  . Coumadin [Warfarin Sodium] Swelling    Arm swelled  . Penicillins Rash    Has patient had a PCN reaction causing immediate rash, facial/tongue/throat swelling, SOB or lightheadedness with hypotension: Yes Has patient had a PCN reaction causing severe rash involving mucus membranes or skin necrosis: No Has patient had a PCN reaction that required hospitalization: No Has patient had a PCN reaction occurring within the last 10 years: No If all of the above answers are "NO", then may proceed with Cephalosporin use.     Social History:   Social History   Socioeconomic History  . Marital status:  Single    Spouse name: Not on file  . Number of children: Not on file  . Years of education: Not on file  . Highest education level: Not on file  Social Needs  . Financial resource strain: Not on file  . Food insecurity - worry: Not on file  . Food insecurity - inability: Not on file  . Transportation needs - medical: Not on file  . Transportation needs - non-medical: Not on file  Occupational History  . Not on file  Tobacco Use  . Smoking status: Former Smoker    Packs/day: 0.50    Types: Cigarettes    Last attempt to quit: 08/19/2015    Years since quitting: 1.4  . Smokeless tobacco: Never Used  . Tobacco comment: i QUIT SMOKING A YEAR AGO, i DONT REMEMBER WHAT MONTH  Substance and Sexual Activity  . Alcohol use: No    Alcohol/week: 0.0 oz  . Drug use: Yes    Types: Marijuana  . Sexual activity: Yes    Birth  control/protection: None  Other Topics Concern  . Not on file  Social History Narrative  . Not on file    Family History:   Family History  Problem Relation Age of Onset  . CAD Mother 74  . Lung cancer Mother   . Bladder Cancer Mother   . Stroke Mother   . Heart disease Mother        Before age 27  . Hypertension Mother   . Heart attack Mother   . CAD Father 7  . Heart disease Father        Before age 17  . Heart attack Father   . Stroke Father        Bleeding stroke     ROS:  Please see the history of present illness.  ROS  All other ROS reviewed and negative.     Physical Exam/Data:   Vitals:   01/22/17 1926 01/22/17 1952 01/22/17 2100 01/23/17 0419  BP: (!) 74/55 (!) 78/61 (!) 89/65   Pulse: 91 88 91 100  Resp: 17     Temp: 97.6 F (36.4 C)   97.9 F (36.6 C)  TempSrc: Oral     SpO2: 99% 98% 97%   Weight:    129 lb 3 oz (58.6 kg)  Height:        Intake/Output Summary (Last 24 hours) at 01/23/2017 1059 Last data filed at 01/23/2017 0800 Gross per 24 hour  Intake 720 ml  Output -  Net 720 ml   Filed Weights   01/21/17 2054 01/22/17 0507 01/23/17 0419  Weight: 129 lb (58.5 kg) 129 lb (58.5 kg) 129 lb 3 oz (58.6 kg)   Body mass index is 24.41 kg/m.  General:  Well nourished, well developed, in no acute distress HEENT: normal Lymph: no adenopathy Neck: no JVD Endocrine:  No thryomegaly Vascular: No carotid bruits  Cardiac:  RRR; no murmurs, gallops or rubs Lungs: CTA b/l, no wheezing, rhonchi or rales  Abd: soft, nontender  Ext: no edema Musculoskeletal:  No deformities, advanced atrophy for age Skin: warm and dry  Neuro:  No gross focal abnormalities noted Psych:  Normal affect   EKG:  The EKG was personally reviewed and demonstrates:   01/22/17 SR 80bpm, LBBB w/QRS 09/26/16: SR, LBBB w/QRS Telemetry:  Telemetry was personally reviewed and demonstrates:  SR  Relevant CV Studies:  01/22/17: TTE Study Conclusions - Left  ventricle: Abnormal septal motion. The cavity  size was   moderately dilated. Wall thickness was increased in a pattern of   mild LVH. The estimated ejection fraction was 25%. Diffuse   hypokinesis. - Aortic valve: There was mild regurgitation. - Mitral valve: Post repair with ring no signficiant MR and stable   low mean gradient Valve area by continuity equation (using LVOT   flow): 0.82 cm^2. - Atrial septum: No defect or patent foramen ovale was identified. - Tricuspid valve: There was moderate regurgitation.  09/26/16: TTE Study Conclusions - Left ventricle: The cavity size was normal. Wall thickness was   increased in a pattern of mild LVH. Systolic function was   moderately reduced. The estimated ejection fraction was in the   range of 35% to 40%. Diffuse hypokinesis. - Aortic valve: There was moderate regurgitation. Valve area (VTI):   1.76 cm^2. Valve area (Vmax): 1.56 cm^2. - Mitral valve: There is a 26 mm Edwards McArthy-Adams anular ring   present at the MV anulus. There was no evidence for stenosis.   There was no significant regurgitation. Mean gradient (D): 3 mm   Hg. - Left atrium: The atrium was mildly dilated. - Pulmonary arteries: PA peak pressure: 32 mm Hg (S). PASP is   borderline elevated. - Technically adequate study.  08/21/16: R/LHC  Ost RCA to Prox RCA lesion, 10 %stenosed.  Dist RCA lesion, 30 %stenosed.  Prox Cx to Mid Cx lesion, 10 %stenosed.  Prox LAD to Mid LAD lesion, 30 %stenosed.   1. Patent stent in the Circumflex artery with minimal restenosis 2. Mild non-obstructive disease in the RCA and LAD (of note, there was spasm in the RCA with catheter engagement that resolved with SL NTG) Recommendations: Medical management of CAD. Continue planning for valve repair/replacement.   10/12/15: TTE, LVEF 40-45%  Laboratory Data:  Chemistry Recent Labs  Lab 01/21/17 2256 01/22/17 0335 01/23/17 0420  NA 139 141 141  K 4.0 3.9 4.5  CL 98* 104  104  CO2 30 28 24   GLUCOSE 101* 98 77  BUN 19 18 13   CREATININE 1.04* 0.83 0.84  CALCIUM 9.0 9.0 9.3  GFRNONAA >60 >60 >60  GFRAA >60 >60 >60  ANIONGAP 11 9 13     Recent Labs  Lab 01/21/17 2256  PROT 6.8  ALBUMIN 3.3*  AST 32  ALT 23  ALKPHOS 89  BILITOT 0.9   Hematology Recent Labs  Lab 01/21/17 2256  WBC 6.0  RBC 4.91  HGB 13.4  HCT 43.0  MCV 87.6  MCH 27.3  MCHC 31.2  RDW 16.1*  PLT 229   Cardiac EnzymesNo results for input(s): TROPONINI in the last 168 hours. No results for input(s): TROPIPOC in the last 168 hours.  BNP Recent Labs  Lab 01/21/17 2304  BNP 322.4*    DDimer No results for input(s): DDIMER in the last 168 hours.  Radiology/Studies:  No results found.  Assessment and Plan:   1. NICM     To date, she has not been able to tolerate ACE/entrsto with hypotension     Maintained on BB and PRN diuretics out patient     LVEF has declined, this admission to 25%, previously in 35-40% range post-MVrepair in Sept 2018  Given she appears to be on max medical therpy with BP limitations, and reduced EF, reasonable to discuss ICD, w/LBBB, CRT device  The patient is agreeable to consider device implant, discussed indication and rational.    MD to see later this afternoon.     For questions  or updates, please contact [facility] Please consult [email or web address] for contact info under Cardiology/STEMI.   Signed, Sheilah Pigeon, PA-C  01/23/2017 10:59 AM   I have seen, examined the patient, and reviewed the above assessment and plan.  Changes to above are made where necessary.  On exam, pleasant female, NAD. RRR.  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  I have spoken with Dr Antoine Poche.  We agree that medical management is limited by hypotension and that her best option at this point is CRT.  Given EF < 35%, ICD is also reasonable.  I will discuss with Dr Ladona Ridgel in the AM to see if his schedule will  allow for CRT-D implantation tomorrow.   Hillis Range MD, [facility] 01/23/2017 11:25 PM

## 2017-01-23 NOTE — Progress Notes (Signed)
Progress Note  Patient Name: Lindsay Camacho Date of Encounter: 01/23/2017  Primary Cardiologist: Lance Muss, MD   Subjective   Currently asymptomatic sitting up with head of bed elevated. Pt continues to experience exertional dyspnea and orthopnea. No chest pain. Hypotensive, but asymptomatic.   Inpatient Medications    Scheduled Meds: . aspirin EC  81 mg Oral Daily  . atorvastatin  80 mg Oral Daily  . carvedilol  6.25 mg Oral BID WC  . clopidogrel  75 mg Oral Q breakfast  . enoxaparin (LOVENOX) injection  40 mg Subcutaneous Daily  . sodium chloride flush  3 mL Intravenous Q12H   Continuous Infusions: . sodium chloride     PRN Meds: sodium chloride, acetaminophen, hydrocortisone cream, ondansetron (ZOFRAN) IV, sodium chloride flush   Vital Signs    Vitals:   01/22/17 1926 01/22/17 1952 01/22/17 2100 01/23/17 0419  BP: (!) 74/55 (!) 78/61 (!) 89/65   Pulse: 91 88 91 100  Resp: 17     Temp: 97.6 F (36.4 C)   97.9 F (36.6 C)  TempSrc: Oral     SpO2: 99% 98% 97%   Weight:    129 lb 3 oz (58.6 kg)  Height:        Intake/Output Summary (Last 24 hours) at 01/23/2017 0954 Last data filed at 01/22/2017 2000 Gross per 24 hour  Intake 480 ml  Output -  Net 480 ml   Filed Weights   01/21/17 2054 01/22/17 0507 01/23/17 0419  Weight: 129 lb (58.5 kg) 129 lb (58.5 kg) 129 lb 3 oz (58.6 kg)    Telemetry    NSR - Personally Reviewed  ECG    NSR, LAE, LBBB - Personally Reviewed  Physical Exam   GEN: No acute distress.  Neck: No JVD Cardiac: RRR, no murmurs, rubs, or gallops.  Respiratory: Clear to auscultation bilaterally. GI: Soft, nontender, non-distended  MS: No edema; No deformity. Neuro:  Nonfocal  Psych: Normal affect   Labs    Chemistry Recent Labs  Lab 01/21/17 2256 01/22/17 0335 01/23/17 0420  NA 139 141 141  K 4.0 3.9 4.5  CL 98* 104 104  CO2 30 28 24   GLUCOSE 101* 98 77  BUN 19 18 13   CREATININE 1.04* 0.83 0.84  CALCIUM 9.0  9.0 9.3  PROT 6.8  --   --   ALBUMIN 3.3*  --   --   AST 32  --   --   ALT 23  --   --   ALKPHOS 89  --   --   BILITOT 0.9  --   --   GFRNONAA >60 >60 >60  GFRAA >60 >60 >60  ANIONGAP 11 9 13      Hematology Recent Labs  Lab 01/21/17 2256  WBC 6.0  RBC 4.91  HGB 13.4  HCT 43.0  MCV 87.6  MCH 27.3  MCHC 31.2  RDW 16.1*  PLT 229    Cardiac EnzymesNo results for input(s): TROPONINI in the last 168 hours. No results for input(s): TROPIPOC in the last 168 hours.   BNP Recent Labs  Lab 01/21/17 2304  BNP 322.4*     DDimer No results for input(s): DDIMER in the last 168 hours.   Radiology    No results found.  Cardiac Studies   2D Echo 01/22/17 Study Conclusions  - Left ventricle: Abnormal septal motion. The cavity size was   moderately dilated. Wall thickness was increased in a pattern of   mild  LVH. The estimated ejection fraction was 25%. Diffuse   hypokinesis. - Aortic valve: There was mild regurgitation. - Mitral valve: Post repair with ring no signficiant MR and stable   low mean gradient Valve area by continuity equation (using LVOT   flow): 0.82 cm^2. - Atrial septum: No defect or patent foramen ovale was identified. - Tricuspid valve: There was moderate regurgitation.  Patient Profile     52 y.o. female with ah/o CAD s/plateral wall MI in 2015treated with PCI to the CXF. She had a repeat cath in 09/2014 showing patent stent. She unfortunately had a RP bleed post cath and required vascular surgery. She had another R/LHC prior to valve surgery 08/21/16 that showed patent CFX stent with minal restenosis + mild nonobstructive CAD in the RCA and LAD. She also has h/o chronic systolic HF w previous EF of 35-40%, severe MR and tobacco abuse.  S/p minimally invasive MV Repair  08/25/16.   Transferred from [place] with decrease in EF to 20-25% and was admitted with CHF-she had been SOB for about 1 week.    Assessment & Plan    1. Acute on Chronic Systolic HF:  Pt does not appear to be grossly volume overloaded on exam, but continues to complain of exertional dyspnea and orthopnea. I/Os inaccurate as UOP not recorded. EF has decreased from 35-40%, down to 20-25%. Management with guidelines directed therapy for systolic HF has been limited by hypotension. Pt did not tolerate Entresto (SBP dropped to 70s, Entresto discontinued). Coreg on hold this morning given systolic pressure in the 80s. Given continued symptoms.    3. CAD: s/plateral wall MI in 2015treated with PCI to the CXF. Repeat cath 08/2016 showed patent CFx stent with minimal restenosis + mild nonobstructive CAD in the RCA and LAD. She denies CP. Continue medical therapy w/ ASA and statin. BB currently on hold given hypotension.   4. Valvular Heart Disease: S/p minimally invasive MV Repair  08/25/16. Echo this admit shows no signficiant MR and stable low mean gradient Valve area by continuity equation (using LVOT flow): 0.82 cm^2.  For questions or updates, please contact [facility] Please consult [email or web address] for contact info under Cardiology/STEMI.      Signed, Robbie Lis, PA-C  01/23/2017, 9:54 AM     History and all data above reviewed.  Patient examined.  I agree with the findings as above.   The patient is breathing much better than when she came into [facility].  She says almost at baseline.  However, she clearly is not going to tolerate any significant titration of her meds as her BP is too low. We will have to back off of Entresto and wait to restart beta blocker and, if possible, ACE later on.  The patient exam reveals COR:RRR  ,  Lungs: Clear  ,  Abd: Positive bowel sounds, no rebound no guarding, Ext No edema  .  All available labs, radiology testing, previous records reviewed. Agree with documented assessment and plan. Acute on chronic systolic HF:  Given the inability to titrate meds I think it is reasonable to have EP see her to consider CRT/ICD.  At this point I  don't see a role for more advanced therapies as she has no acute symptoms and has normal organ perfusion.  She might however benefit from CRT since OMT will ultimately be a very scant medical regimen.    [name] [facility]  10:42 AM  01/23/2017

## 2017-01-23 NOTE — Progress Notes (Signed)
Patient's BP 82/56, taken in right arm. Recheck in left arm is 77/64; pt states her BP is always lower in left arm. Patient asymptomatic at this time. 0800 dose of carvedilol not given. Christiana Fuchs, PA paged and made aware.

## 2017-01-24 ENCOUNTER — Inpatient Hospital Stay (HOSPITAL_COMMUNITY)
Admission: AD | Disposition: A | Discharge status: Home / Self Care | Payer: Self-pay | Source: Other Acute Inpatient Hospital | Attending: Internal Medicine

## 2017-01-24 HISTORY — PX: BIV ICD INSERTION CRT-D: EP1195

## 2017-01-24 LAB — BASIC METABOLIC PANEL
Anion gap: 9 (ref 5–15)
BUN: 13 mg/dL (ref 6–20)
CHLORIDE: 104 mmol/L (ref 101–111)
CO2: 26 mmol/L (ref 22–32)
Calcium: 9 mg/dL (ref 8.9–10.3)
Creatinine, Ser: 0.86 mg/dL (ref 0.44–1.00)
GFR calc Af Amer: >60 mL/min (ref >60)
GFR calc non Af Amer: >60 mL/min (ref >60)
Glucose, Bld: 88 mg/dL (ref 65–99)
POTASSIUM: 4.9 mmol/L (ref 3.5–5.1)
SODIUM: 139 mmol/L (ref 135–145)

## 2017-01-24 LAB — SURGICAL PCR SCREEN
MRSA, PCR: NEGATIVE
STAPHYLOCOCCUS AUREUS: NEGATIVE

## 2017-01-24 SURGERY — BIV ICD INSERTION CRT-D

## 2017-01-24 MED ORDER — SODIUM CHLORIDE 0.9% FLUSH: 3.0000 mL | Freq: Two times a day (BID) | INTRAVENOUS | Status: DC

## 2017-01-24 MED ORDER — SODIUM CHLORIDE 0.9 % IV SOLN: 250.0000 mL | INTRAVENOUS | Status: DC

## 2017-01-24 MED ORDER — SODIUM CHLORIDE 0.9 % IV SOLN: INTRAVENOUS | Status: DC

## 2017-01-24 MED ORDER — MIDAZOLAM HCL 5 MG/5ML IJ SOLN
INTRAMUSCULAR | Status: AC
  Filled 2017-01-24: qty 5

## 2017-01-24 MED ORDER — FENTANYL CITRATE (PF) 100 MCG/2ML IJ SOLN
INTRAMUSCULAR | Status: DC | PRN
  Administered 2017-01-24 (×2): 12.5 ug via INTRAVENOUS
  Administered 2017-01-24: 25 ug via INTRAVENOUS
  Administered 2017-01-24 (×3): 12.5 ug via INTRAVENOUS

## 2017-01-24 MED ORDER — ACETAMINOPHEN 325 MG PO TABS
325.0000 mg | ORAL_TABLET | ORAL | Status: DC | PRN
  Administered 2017-01-24 – 2017-01-25 (×2): 650 mg via ORAL
  Filled 2017-01-24 (×3): qty 2

## 2017-01-24 MED ORDER — SODIUM CHLORIDE 0.9 % IR SOLN
Status: AC
  Filled 2017-01-24: qty 2

## 2017-01-24 MED ORDER — SODIUM CHLORIDE 0.9 % IR SOLN: 80.0000 mg | Status: DC

## 2017-01-24 MED ORDER — SODIUM CHLORIDE 0.9% FLUSH: 3.0000 mL | INTRAVENOUS | Status: DC | PRN

## 2017-01-24 MED ORDER — LIDOCAINE HCL 1 % IJ SOLN
INTRAMUSCULAR | Status: AC
  Filled 2017-01-24: qty 20

## 2017-01-24 MED ORDER — VANCOMYCIN HCL IN DEXTROSE 1-5 GM/200ML-% IV SOLN
1000.0000 mg | INTRAVENOUS | Status: AC
  Administered 2017-01-24: 1000 mg via INTRAVENOUS

## 2017-01-24 MED ORDER — LIDOCAINE HCL (PF) 1 % IJ SOLN
INTRAMUSCULAR | Status: DC | PRN
Start: 2017-01-24 — End: 2017-01-24
  Administered 2017-01-24: 90 mL

## 2017-01-24 MED ORDER — ONDANSETRON HCL 4 MG/2ML IJ SOLN: 4.0000 mg | Freq: Four times a day (QID) | INTRAMUSCULAR | Status: DC | PRN

## 2017-01-24 MED ORDER — HEPARIN (PORCINE) IN NACL 2-0.9 UNIT/ML-% IJ SOLN
INTRAMUSCULAR | Status: AC | PRN
  Administered 2017-01-24: 500 mL

## 2017-01-24 MED ORDER — OXYCODONE HCL 5 MG PO TABS
5.0000 mg | ORAL_TABLET | Freq: Once | ORAL | Status: AC
  Administered 2017-01-24: 5 mg via ORAL
  Filled 2017-01-24: qty 1

## 2017-01-24 MED ORDER — GENTAMICIN SULFATE 40 MG/ML IJ SOLN
80.0000 mg | INTRAMUSCULAR | Status: AC
  Administered 2017-01-24: 80 mg

## 2017-01-24 MED ORDER — VANCOMYCIN HCL IN DEXTROSE 1-5 GM/200ML-% IV SOLN
1000.0000 mg | Freq: Two times a day (BID) | INTRAVENOUS | Status: AC
  Administered 2017-01-25: 1000 mg via INTRAVENOUS
  Filled 2017-01-24: qty 200

## 2017-01-24 MED ORDER — IOPAMIDOL (ISOVUE-370) INJECTION 76%
INTRAVENOUS | Status: DC | PRN
  Administered 2017-01-24: 10 mL via INTRAVENOUS

## 2017-01-24 MED ORDER — VANCOMYCIN HCL IN DEXTROSE 1-5 GM/200ML-% IV SOLN: 1000.0000 mg | INTRAVENOUS | Status: DC

## 2017-01-24 MED ORDER — CHLORHEXIDINE GLUCONATE 4 % EX LIQD
60.0000 mL | Freq: Once | CUTANEOUS | Status: AC
  Administered 2017-01-24: 4 via TOPICAL

## 2017-01-24 MED ORDER — VANCOMYCIN HCL IN DEXTROSE 1-5 GM/200ML-% IV SOLN
INTRAVENOUS | Status: AC
  Filled 2017-01-24: qty 200

## 2017-01-24 MED ORDER — CHLORHEXIDINE GLUCONATE 4 % EX LIQD
60.0000 mL | Freq: Once | CUTANEOUS | Status: AC
  Filled 2017-01-24: qty 60

## 2017-01-24 MED ORDER — FENTANYL CITRATE (PF) 100 MCG/2ML IJ SOLN
INTRAMUSCULAR | Status: AC
  Filled 2017-01-24: qty 2

## 2017-01-24 MED ORDER — IOPAMIDOL (ISOVUE-370) INJECTION 76%
INTRAVENOUS | Status: AC
  Filled 2017-01-24: qty 50

## 2017-01-24 MED ORDER — SODIUM CHLORIDE 0.9 % IV SOLN
INTRAVENOUS | Status: DC
  Administered 2017-01-24: 11:00:00 via INTRAVENOUS

## 2017-01-24 MED ORDER — MIDAZOLAM HCL 5 MG/5ML IJ SOLN
INTRAMUSCULAR | Status: DC | PRN
  Administered 2017-01-24 (×9): 1 mg via INTRAVENOUS

## 2017-01-24 MED ORDER — CHLORHEXIDINE GLUCONATE 4 % EX LIQD: 60.0000 mL | Freq: Once | CUTANEOUS | Status: AC

## 2017-01-24 SURGICAL SUPPLY — 22 items
ADAPTER SEALING SSA-EW-09 (MISCELLANEOUS) ×2 IMPLANT
ADPR INTRO LNG 9FR SL XTD WNG (MISCELLANEOUS) ×1
CABLE SURGICAL S-101-97-12 (CABLE) ×2 IMPLANT
CATH ATTAIN COM SURV 6250V-MB2 (CATHETERS) ×2 IMPLANT
CATH ATTAIN SEL SURV 6248V-90 (CATHETERS) ×2 IMPLANT
CATH HEX JOSEPH 2-5-2 65CM 6F (CATHETERS) ×2 IMPLANT
CATH RIGHTSITE C315HIS02 (CATHETERS) ×2 IMPLANT
ICD CLARIA MRI DTMA1D1 (ICD Generator) ×2 IMPLANT
LEAD CAPSURE NOVUS 45CM (Lead) ×2 IMPLANT
LEAD SELECT SECURE 3830 383069 (Lead) IMPLANT
LEAD SPRINT QUAT SEC 6935-58CM (Lead) ×2 IMPLANT
PAD DEFIB LIFELINK (PAD) ×2 IMPLANT
SELECT SECURE 3830 383069 (Lead) ×3 IMPLANT
SHEATH CLASSIC 7F (SHEATH) ×2 IMPLANT
SHEATH CLASSIC 9.5F (SHEATH) ×2 IMPLANT
SHEATH CLASSIC 9F (SHEATH) ×2 IMPLANT
SHEATH WORLEY 9FR 62CM (SHEATH) ×2 IMPLANT
SLITTER 6232ADJ (MISCELLANEOUS) ×2 IMPLANT
TRAY PACEMAKER INSERTION (PACKS) ×2 IMPLANT
WIRE ASAHI SOFT 180CM (WIRE) ×2 IMPLANT
WIRE HI TORQ BMW 190CM (WIRE) ×2 IMPLANT
WIRE LUGE 182CM (WIRE) ×4 IMPLANT

## 2017-01-24 NOTE — Interval H&P Note (Signed)
History and Physical Interval Note:  01/24/2017 3:49 PM  Lindsay Camacho  has presented today for surgery, with the diagnosis of hb  The various methods of treatment have been discussed with the patient and family. After consideration of risks, benefits and other options for treatment, the patient has consented to  Procedure(s): BIV ICD INSERTION CRT-D (N/A) as a surgical intervention .  The patient's history has been reviewed, patient examined, no change in status, stable for surgery.  I have reviewed the patient's chart and labs.  Questions were answered to the patient's satisfaction.     Lewayne Bunting

## 2017-01-24 NOTE — Progress Notes (Signed)
Progress Note  Patient Name: Lindsay Camacho Date of Encounter: 01/24/2017  Primary Cardiologist: Lance Muss, MD   Subjective   Doing ok this morning. Resting comfortably. No dyspnea. No chest pain.   Inpatient Medications    Scheduled Meds: . aspirin EC  81 mg Oral Daily  . atorvastatin  80 mg Oral Daily  . carvedilol  6.25 mg Oral BID WC  . clopidogrel  75 mg Oral Q breakfast  . sodium chloride flush  3 mL Intravenous Q12H   Continuous Infusions: . sodium chloride     PRN Meds: sodium chloride, acetaminophen, hydrocortisone cream, ondansetron (ZOFRAN) IV, sodium chloride flush   Vital Signs    Vitals:   01/23/17 1412 01/23/17 1739 01/23/17 2100 01/24/17 0630  BP: 91/63 100/71 (!) 98/56 124/85  Pulse:  90 91 93  Resp:   16 17  Temp: (!) 97.5 F (36.4 C)  98.1 F (36.7 C) 97.6 F (36.4 C)  TempSrc: Axillary  Oral Oral  SpO2: 97%  99% 99%  Weight:    126 lb 1.6 oz (57.2 kg)  Height:        Intake/Output Summary (Last 24 hours) at 01/24/2017 0739 Last data filed at 01/23/2017 2100 Gross per 24 hour  Intake 1200 ml  Output 1000 ml  Net 200 ml   Filed Weights   01/22/17 0507 01/23/17 0419 01/24/17 0630  Weight: 129 lb (58.5 kg) 129 lb 3 oz (58.6 kg) 126 lb 1.6 oz (57.2 kg)    Telemetry     NSR - Personally Reviewed  ECG    NSR, LAE and LBBB Personally Reviewed  Physical Exam   GEN: No acute distress.   Neck: No JVD Cardiac: RRR, no murmurs, rubs, or gallops.  Respiratory: Clear to auscultation bilaterally. GI: Soft, nontender, non-distended  MS: No edema; No deformity. Neuro:  Nonfocal  Psych: Normal affect   Labs    Chemistry Recent Labs  Lab 01/21/17 2256 01/22/17 0335 01/23/17 0420 01/24/17 0518  NA 139 141 141 139  K 4.0 3.9 4.5 4.9  CL 98* 104 104 104  CO2 30 28 24 26   GLUCOSE 101* 98 77 88  BUN 19 18 13 13   CREATININE 1.04* 0.83 0.84 0.86  CALCIUM 9.0 9.0 9.3 9.0  PROT 6.8  --   --   --   ALBUMIN 3.3*  --   --   --     AST 32  --   --   --   ALT 23  --   --   --   ALKPHOS 89  --   --   --   BILITOT 0.9  --   --   --   GFRNONAA >60 >60 >60 >60  GFRAA >60 >60 >60 >60  ANIONGAP 11 9 13 9      Hematology Recent Labs  Lab 01/21/17 2256  WBC 6.0  RBC 4.91  HGB 13.4  HCT 43.0  MCV 87.6  MCH 27.3  MCHC 31.2  RDW 16.1*  PLT 229    Cardiac EnzymesNo results for input(s): TROPONINI in the last 168 hours. No results for input(s): TROPIPOC in the last 168 hours.   BNP Recent Labs  Lab 01/21/17 2304  BNP 322.4*     DDimer No results for input(s): DDIMER in the last 168 hours.   Radiology    No results found.  Cardiac Studies   2D Echo 01/22/17 Study Conclusions  - Left ventricle: Abnormal septal motion. The cavity  size was moderately dilated. Wall thickness was increased in a pattern of mild LVH. The estimated ejection fraction was 25%. Diffuse hypokinesis. - Aortic valve: There was mild regurgitation. - Mitral valve: Post repair with ring no signficiant MR and stable low mean gradient Valve area by continuity equation (using LVOT flow): 0.82 cm^2. - Atrial septum: No defect or patent foramen ovale was identified. - Tricuspid valve: There was moderate regurgitation.   Patient Profile   52 y.o.femalewith ah/o CAD s/plateral wall MI in 2015treated with PCI to the CXF. She had a repeat cath in 09/2014 showing patent stent. She unfortunately had a RP bleed post cath and required vascular surgery. She had another R/LHC prior to valve surgery 08/21/16 that showed patent CFX stent with minal restenosis + mild nonobstructive CAD in the RCA and LAD. She also has h/o chronic systolic HF w previous EF of 35-40%, severe MR and tobacco abuse.S/p minimally invasive MV Repair 08/25/16. Transferred from [place] with decrease in EF to 20-25% and was admitted with CHF-she had been SOB for about 1 week.  Assessment & Plan    1. Acute on chronic Systolic CHF: euvolemic on exam. No  dyspnea. EF chronically low, 25% on most recent echo. Guidelines directed  medial therapy for systolic HF has been limited due to issues with hypotension. Evaluated by EP yesterday. Felt to be a candidate for CRT (LBBB with QRS 152 msec), possibly ICD. EP to decide. Dr. Johney Frame to discuss with Dr. Ladona Ridgel. Plan is for device implant, possibly later today if schedule allows. Continue Coreg. Monitor BP. Pt has not tolerated low dose ACE-I nor Entrestro given hypotension.   2. CAD: h/o lateral wall MI in 2015, treated with PCI + stenting to the LCx. Repeat cath 08/2016 showed patent LCx stent with minimal restenosis and mild nonobstructive CAD in the RCA and LAD. Pt is currently CP free. Continue medical management for secondary prevention. ASA + statin. Unable to tolerate BB due to hypotension.   3. Valvular Heart Disease: s/p minimally invasive MV repair 08/25/16 by Dr. Cornelius Moras. Echo this admit shows stable valve and gradient.   For questions or updates, please contact [facility] Please consult [email or web address] for contact info under Cardiology/STEMI.      Signed, Robbie Lis, PA-C  01/24/2017, 7:39 AM    History and all data above reviewed.  Patient examined.  I agree with the findings as above.  She feels back to baseline.  Her BP has recovered from the [facility]. The patient exam reveals COR:RRR  ,  Lungs: Clear  ,  Abd: Positive bowel sounds, no rebound no guarding, Ext No edema  .  All available labs, radiology testing, previous records reviewed. Agree with documented assessment and plan. Acute on chronic systolic HF:  Unable to titrate meds but her BP is improved.  CRT/ICD today and then titrate meds very slowly per Dr. Eldridge Dace as an outpatient.   [name] Yochanan Eddleman  11:41 AM  01/24/2017

## 2017-01-24 NOTE — Progress Notes (Signed)
Progress Note  Patient Name: Lindsay Camacho Date of Encounter: 01/24/2017  Primary Cardiologist: Lance Muss, MD   Subjective   Feels OK this morning, no CP, no SOB  Inpatient Medications    Scheduled Meds: . aspirin EC  81 mg Oral Daily  . atorvastatin  80 mg Oral Daily  . carvedilol  6.25 mg Oral BID WC  . clopidogrel  75 mg Oral Q breakfast  . sodium chloride flush  3 mL Intravenous Q12H   Continuous Infusions: . sodium chloride     PRN Meds: sodium chloride, acetaminophen, hydrocortisone cream, ondansetron (ZOFRAN) IV, sodium chloride flush   Vital Signs    Vitals:   01/23/17 1739 01/23/17 2100 01/24/17 0630 01/24/17 0823  BP: 100/71 (!) 98/56 124/85 101/81  Pulse: 90 91 93 85  Resp:  16 17   Temp:  98.1 F (36.7 C) 97.6 F (36.4 C)   TempSrc:  Oral Oral   SpO2:  99% 99%   Weight:   126 lb 1.6 oz (57.2 kg)   Height:        Intake/Output Summary (Last 24 hours) at 01/24/2017 0904 Last data filed at 01/24/2017 0800 Gross per 24 hour  Intake 960 ml  Output 1700 ml  Net -740 ml   Filed Weights   01/22/17 0507 01/23/17 0419 01/24/17 0630  Weight: 129 lb (58.5 kg) 129 lb 3 oz (58.6 kg) 126 lb 1.6 oz (57.2 kg)    Telemetry    SR, 70's - Personally Reviewed  ECG    no new EKGs - Personally Reviewed  Physical Exam   GEN: No acute distress.   Neck: No JVD Cardiac: RRR, no murmurs, rubs, or gallops.  Respiratory: CTA b/l. GI: Soft, nontender, non-distended  MS: No edema; No deformity. Neuro:  Nonfocal  Psych: Normal affect  Skin: very superficial lesions across upper chest, (patient reports chronic itching/scratching), there is a R chest healed surgical wound (she reports from her MVR) with a small scab far lateral edge.  Labs    Chemistry Recent Labs  Lab 01/21/17 2256 01/22/17 0335 01/23/17 0420 01/24/17 0518  NA 139 141 141 139  K 4.0 3.9 4.5 4.9  CL 98* 104 104 104  CO2 30 28 24 26   GLUCOSE 101* 98 77 88  BUN 19 18 13 13     CREATININE 1.04* 0.83 0.84 0.86  CALCIUM 9.0 9.0 9.3 9.0  PROT 6.8  --   --   --   ALBUMIN 3.3*  --   --   --   AST 32  --   --   --   ALT 23  --   --   --   ALKPHOS 89  --   --   --   BILITOT 0.9  --   --   --   GFRNONAA >60 >60 >60 >60  GFRAA >60 >60 >60 >60  ANIONGAP 11 9 13 9      Hematology Recent Labs  Lab 01/21/17 2256  WBC 6.0  RBC 4.91  HGB 13.4  HCT 43.0  MCV 87.6  MCH 27.3  MCHC 31.2  RDW 16.1*  PLT 229    Cardiac EnzymesNo results for input(s): TROPONINI in the last 168 hours. No results for input(s): TROPIPOC in the last 168 hours.   BNP Recent Labs  Lab 01/21/17 2304  BNP 322.4*     DDimer No results for input(s): DDIMER in the last 168 hours.   Radiology    No  results found.  Cardiac Studies   01/22/17: TTE Study Conclusions - Left ventricle: Abnormal septal motion. The cavity size was moderately dilated. Wall thickness was increased in a pattern of mild LVH. The estimated ejection fraction was 25%. Diffuse hypokinesis. - Aortic valve: There was mild regurgitation. - Mitral valve: Post repair with ring no signficiant MR and stable low mean gradient Valve area by continuity equation (using LVOT flow): 0.82 cm^2. - Atrial septum: No defect or patent foramen ovale was identified. - Tricuspid valve: There was moderate regurgitation.  09/26/16: TTE Study Conclusions - Left ventricle: The cavity size was normal. Wall thickness was increased in a pattern of mild LVH. Systolic function was moderately reduced. The estimated ejection fraction was in the range of 35% to 40%. Diffuse hypokinesis. - Aortic valve: There was moderate regurgitation. Valve area (VTI): 1.76 cm^2. Valve area (Vmax): 1.56 cm^2. - Mitral valve: There is a 26 mm Edwards McArthy-Adams anular ring present at the MV anulus. There was no evidence for stenosis. There was no significant regurgitation. Mean gradient (D): 3 mm Hg. - Left atrium: The  atrium was mildly dilated. - Pulmonary arteries: PA peak pressure: 32 mm Hg (S). PASP is borderline elevated. - Technically adequate study.  08/21/16: R/LHC  Ost RCA to Prox RCA lesion, 10 %stenosed.  Dist RCA lesion, 30 %stenosed.  Prox Cx to Mid Cx lesion, 10 %stenosed.  Prox LAD to Mid LAD lesion, 30 %stenosed.  1. Patent stent in the Circumflex artery with minimal restenosis 2. Mild non-obstructive disease in the RCA and LAD (of note, there was spasm in the RCA with catheter engagement that resolved with SL NTG) Recommendations: Medical management of CAD. Continue planning for valve repair/replacement.   10/12/15: TTE, LVEF 40-45%   Patient Profile     52 y.o. female with a hx of CAD (MI w/PCT to Cx in 2015), HTN, HLD, severe MR>> MV repair 08/25/2016 (required dental extractions prior), LBBB, chronic CHF, and it seem since her MV repair a chronic chest wall discomfort  Initially sought attention for c/o SOB/DOE in [place], she was admitted with CHF exacerbation 01/16/17 and diuresed transitioned to PO lasix 01/18/17, and echo done there noted reductionin her LVEF from her baseline and she was transferred to [facility] for further care and management.    Assessment & Plan    1. NICM     To date, she has not been able to tolerate ACE/entrsto with hypotension     Maintained on BB and PRN diuretics out patient      LVEF has declined, this admission to 25%, previously in 35-40% range post-MVrepair in Sept 2018 she appears to be on max medical therpy given her BP limitations, and reduced EF, reasonable to pursue ICD, w/LBBB, CRT device  I have discussed the procedure, risks and benefits with the patient, she would like to proceed,  Dr. Ladona Ridgel will see her later this morning  The patient has superficial lesions on upper chest, she reports chronic itching since her MV repair done last year.  I will have Dr. Ladona Ridgel evaluate.     For questions or updates, please contact [facility] Please consult [email or web address] for contact info under Cardiology/STEMI.      Signed, Sheilah Pigeon, PA-C  01/24/2017, 9:04 AM    EP Attending  Patient seen and examined. Agree with above. The patient is stable today. Discussed with Dr. Fawn Kirk. She has chronic systolic heart failure, LBBB, and unable to take an ACE inhibitor due to hypotension.  I have discussed the indications/risk/benefits/goals/expectations of BiV ICD insertion and she wishes to proceed.  Leonia Reeves.D.

## 2017-01-24 NOTE — H&P (View-Only) (Signed)
Progress Note  Patient Name: Lindsay Camacho Date of Encounter: 01/24/2017  Primary Cardiologist: Lance Muss, MD   Subjective   Feels OK this morning, no CP, no SOB  Inpatient Medications    Scheduled Meds: . aspirin EC  81 mg Oral Daily  . atorvastatin  80 mg Oral Daily  . carvedilol  6.25 mg Oral BID WC  . clopidogrel  75 mg Oral Q breakfast  . sodium chloride flush  3 mL Intravenous Q12H   Continuous Infusions: . sodium chloride     PRN Meds: sodium chloride, acetaminophen, hydrocortisone cream, ondansetron (ZOFRAN) IV, sodium chloride flush   Vital Signs    Vitals:   01/23/17 1739 01/23/17 2100 01/24/17 0630 01/24/17 0823  BP: 100/71 (!) 98/56 124/85 101/81  Pulse: 90 91 93 85  Resp:  16 17   Temp:  98.1 F (36.7 C) 97.6 F (36.4 C)   TempSrc:  Oral Oral   SpO2:  99% 99%   Weight:   126 lb 1.6 oz (57.2 kg)   Height:        Intake/Output Summary (Last 24 hours) at 01/24/2017 0904 Last data filed at 01/24/2017 0800 Gross per 24 hour  Intake 960 ml  Output 1700 ml  Net -740 ml   Filed Weights   01/22/17 0507 01/23/17 0419 01/24/17 0630  Weight: 129 lb (58.5 kg) 129 lb 3 oz (58.6 kg) 126 lb 1.6 oz (57.2 kg)    Telemetry    SR, 70's - Personally Reviewed  ECG    no new EKGs - Personally Reviewed  Physical Exam   GEN: No acute distress.   Neck: No JVD Cardiac: RRR, no murmurs, rubs, or gallops.  Respiratory: CTA b/l. GI: Soft, nontender, non-distended  MS: No edema; No deformity. Neuro:  Nonfocal  Psych: Normal affect  Skin: very superficial lesions across upper chest, (patient reports chronic itching/scratching), there is a R chest healed surgical wound (she reports from her MVR) with a small scab far lateral edge.  Labs    Chemistry Recent Labs  Lab 01/21/17 2256 01/22/17 0335 01/23/17 0420 01/24/17 0518  NA 139 141 141 139  K 4.0 3.9 4.5 4.9  CL 98* 104 104 104  CO2 30 28 24 26   GLUCOSE 101* 98 77 88  BUN 19 18 13 13     CREATININE 1.04* 0.83 0.84 0.86  CALCIUM 9.0 9.0 9.3 9.0  PROT 6.8  --   --   --   ALBUMIN 3.3*  --   --   --   AST 32  --   --   --   ALT 23  --   --   --   ALKPHOS 89  --   --   --   BILITOT 0.9  --   --   --   GFRNONAA >60 >60 >60 >60  GFRAA >60 >60 >60 >60  ANIONGAP 11 9 13 9      Hematology Recent Labs  Lab 01/21/17 2256  WBC 6.0  RBC 4.91  HGB 13.4  HCT 43.0  MCV 87.6  MCH 27.3  MCHC 31.2  RDW 16.1*  PLT 229    Cardiac EnzymesNo results for input(s): TROPONINI in the last 168 hours. No results for input(s): TROPIPOC in the last 168 hours.   BNP Recent Labs  Lab 01/21/17 2304  BNP 322.4*     DDimer No results for input(s): DDIMER in the last 168 hours.   Radiology    No  results found.  Cardiac Studies   01/22/17: TTE Study Conclusions - Left ventricle: Abnormal septal motion. The cavity size was moderately dilated. Wall thickness was increased in a pattern of mild LVH. The estimated ejection fraction was 25%. Diffuse hypokinesis. - Aortic valve: There was mild regurgitation. - Mitral valve: Post repair with ring no signficiant MR and stable low mean gradient Valve area by continuity equation (using LVOT flow): 0.82 cm^2. - Atrial septum: No defect or patent foramen ovale was identified. - Tricuspid valve: There was moderate regurgitation.  09/26/16: TTE Study Conclusions - Left ventricle: The cavity size was normal. Wall thickness was increased in a pattern of mild LVH. Systolic function was moderately reduced. The estimated ejection fraction was in the range of 35% to 40%. Diffuse hypokinesis. - Aortic valve: There was moderate regurgitation. Valve area (VTI): 1.76 cm^2. Valve area (Vmax): 1.56 cm^2. - Mitral valve: There is a 26 mm Edwards McArthy-Adams anular ring present at the MV anulus. There was no evidence for stenosis. There was no significant regurgitation. Mean gradient (D): 3 mm Hg. - Left atrium: The  atrium was mildly dilated. - Pulmonary arteries: PA peak pressure: 32 mm Hg (S). PASP is borderline elevated. - Technically adequate study.  08/21/16: R/LHC  Ost RCA to Prox RCA lesion, 10 %stenosed.  Dist RCA lesion, 30 %stenosed.  Prox Cx to Mid Cx lesion, 10 %stenosed.  Prox LAD to Mid LAD lesion, 30 %stenosed.  1. Patent stent in the Circumflex artery with minimal restenosis 2. Mild non-obstructive disease in the RCA and LAD (of note, there was spasm in the RCA with catheter engagement that resolved with SL NTG) Recommendations: Medical management of CAD. Continue planning for valve repair/replacement.   10/12/15: TTE, LVEF 40-45%   Patient Profile     52 y.o. female with a hx of CAD (MI w/PCT to Cx in 2015), HTN, HLD, severe MR>> MV repair 08/25/2016 (required dental extractions prior), LBBB, chronic CHF, and it seem since her MV repair a chronic chest wall discomfort  Initially sought attention for c/o SOB/DOE in [place], she was admitted with CHF exacerbation 01/16/17 and diuresed transitioned to PO lasix 01/18/17, and echo done there noted reductionin her LVEF from her baseline and she was transferred to [facility] for further care and management.    Assessment & Plan    1. NICM     To date, she has not been able to tolerate ACE/entrsto with hypotension     Maintained on BB and PRN diuretics out patient      LVEF has declined, this admission to 25%, previously in 35-40% range post-MVrepair in Sept 2018 she appears to be on max medical therpy given her BP limitations, and reduced EF, reasonable to pursue ICD, w/LBBB, CRT device  I have discussed the procedure, risks and benefits with the patient, she would like to proceed,  Dr. Ladona Ridgel will see her later this morning  The patient has superficial lesions on upper chest, she reports chronic itching since her MV repair done last year.  I will have Dr. Ladona Ridgel evaluate.     For questions or updates, please contact [facility] Please consult [email or web address] for contact info under Cardiology/STEMI.      Signed, Sheilah Pigeon, PA-C  01/24/2017, 9:04 AM    EP Attending  Patient seen and examined. Agree with above. The patient is stable today. Discussed with Dr. Fawn Kirk. She has chronic systolic heart failure, LBBB, and unable to take an ACE inhibitor due to hypotension.  I have discussed the indications/risk/benefits/goals/expectations of BiV ICD insertion and she wishes to proceed.  Leonia Reeves.D.

## 2017-01-25 ENCOUNTER — Inpatient Hospital Stay (HOSPITAL_COMMUNITY): Payer: Medicaid - Out of State

## 2017-01-25 ENCOUNTER — Encounter (HOSPITAL_COMMUNITY): Payer: Self-pay | Admitting: Internal Medicine

## 2017-01-25 DIAGNOSIS — I5021 Acute systolic (congestive) heart failure: Secondary | ICD-10-CM

## 2017-01-25 LAB — BASIC METABOLIC PANEL
Anion gap: 11 (ref 5–15)
BUN: 17 mg/dL (ref 6–20)
CALCIUM: 8.5 mg/dL — AB (ref 8.9–10.3)
CO2: 19 mmol/L — ABNORMAL LOW (ref 22–32)
Chloride: 106 mmol/L (ref 101–111)
Creatinine, Ser: 0.83 mg/dL (ref 0.44–1.00)
GFR calc Af Amer: >60 mL/min (ref >60)
GLUCOSE: 129 mg/dL — AB (ref 65–99)
Potassium: 4 mmol/L (ref 3.5–5.1)
SODIUM: 136 mmol/L (ref 135–145)

## 2017-01-25 MED ORDER — HYDROMORPHONE HCL 1 MG/ML IJ SOLN
0.5000 mg | Freq: Once | INTRAMUSCULAR | Status: AC
  Administered 2017-01-25: 0.5 mg via INTRAVENOUS
  Filled 2017-01-25: qty 0.5

## 2017-01-25 MED ORDER — OXYCODONE-ACETAMINOPHEN 5-325 MG PO TABS
1.0000 | ORAL_TABLET | Freq: Four times a day (QID) | ORAL | Status: DC | PRN
  Administered 2017-01-25 (×3): 2 via ORAL
  Administered 2017-01-26: 1 via ORAL
  Administered 2017-01-26: 2 via ORAL
  Filled 2017-01-25 (×2): qty 2
  Filled 2017-01-25: qty 1
  Filled 2017-01-25 (×2): qty 2

## 2017-01-25 MED FILL — Lidocaine HCl Local Inj 1%: INTRAMUSCULAR | Qty: 80 | Status: AC

## 2017-01-25 MED FILL — Gentamicin Sulfate Inj 40 MG/ML: INTRAMUSCULAR | Qty: 80 | Status: AC

## 2017-01-25 NOTE — Progress Notes (Signed)
Progress Note  Patient Name: Lindsay Camacho Date of Encounter: 01/25/2017  Primary Cardiologist: Lance Muss, MD   Subjective   Significant implant site discomfort, no SOB, no CP  Inpatient Medications    Scheduled Meds: . aspirin EC  81 mg Oral Daily  . atorvastatin  80 mg Oral Daily  . carvedilol  6.25 mg Oral BID WC  . sodium chloride flush  3 mL Intravenous Q12H   Continuous Infusions: . sodium chloride     PRN Meds: sodium chloride, acetaminophen, hydrocortisone cream, ondansetron (ZOFRAN) IV, oxyCODONE-acetaminophen, sodium chloride flush   Vital Signs    Vitals:   01/24/17 2259 01/25/17 0116 01/25/17 0408 01/25/17 0831  BP: 96/65 99/62 134/65 (!) 88/63  Pulse: 77 85 85 90  Resp:   16   Temp:   97.6 F (36.4 C)   TempSrc:   Oral   SpO2: 97% 96% 95%   Weight:   130 lb (59 kg)   Height:        Intake/Output Summary (Last 24 hours) at 01/25/2017 0940 Last data filed at 01/25/2017 0630 Gross per 24 hour  Intake 648 ml  Output -  Net 648 ml   Filed Weights   01/23/17 0419 01/24/17 0630 01/25/17 0408  Weight: 129 lb 3 oz (58.6 kg) 126 lb 1.6 oz (57.2 kg) 130 lb (59 kg)    Telemetry    SR/V paced - Personally Reviewed  ECG    SR/V paced - Personally Reviewed  Physical Exam   GEN: No acute distress.   Neck: No JVD Cardiac: RRR, no murmurs, rubs, or gallops.  Respiratory: CTA b/l. GI: Soft, nontender, non-distended  MS: No edema; No deformity. Neuro:  Nonfocal  Psych: Normal affect  L chest, implant site with small hematoma, no bleeding  Labs    Chemistry Recent Labs  Lab 01/21/17 2256  01/23/17 0420 01/24/17 0518 01/25/17 0354  NA 139   < > 141 139 136  K 4.0   < > 4.5 4.9 4.0  CL 98*   < > 104 104 106  CO2 30   < > 24 26 19*  GLUCOSE 101*   < > 77 88 129*  BUN 19   < > 13 13 17   CREATININE 1.04*   < > 0.84 0.86 0.83  CALCIUM 9.0   < > 9.3 9.0 8.5*  PROT 6.8  --   --   --   --   ALBUMIN 3.3*  --   --   --   --   AST 32  --    --   --   --   ALT 23  --   --   --   --   ALKPHOS 89  --   --   --   --   BILITOT 0.9  --   --   --   --   GFRNONAA >60   < > >60 >60 >60  GFRAA >60   < > >60 >60 >60  ANIONGAP 11   < > 13 9 11    < > = values in this interval not displayed.     Hematology Recent Labs  Lab 01/21/17 2256  WBC 6.0  RBC 4.91  HGB 13.4  HCT 43.0  MCV 87.6  MCH 27.3  MCHC 31.2  RDW 16.1*  PLT 229    Cardiac EnzymesNo results for input(s): TROPONINI in the last 168 hours. No results for input(s): TROPIPOC in the last  168 hours.   BNP Recent Labs  Lab 01/21/17 2304  BNP 322.4*     DDimer No results for input(s): DDIMER in the last 168 hours.   Radiology    Dg Chest 2 View Result Date: 01/25/2017 CLINICAL DATA:  Encounter for ICD placement EXAM: CHEST  2 VIEW COMPARISON:  09/26/2016 FINDINGS: There is a dual-chamber pacer from the left with ICD. 2 right atrial leads are noted. Mild cardiomegaly. Status post mitral valve repair. Linear opacity in the right mid lung which may have decreased. This was not present on 08/22/2016 chest CT and developed after surgery, presumably scarring. No edema, effusion, or pneumothorax. IMPRESSION: No acute finding after dual-chamber pacer implant. Electronically Signed   By: Marnee Spring M.D.   On: 01/25/2017 08:23    Cardiac Studies   01/22/17: TTE Study Conclusions - Left ventricle: Abnormal septal motion. The cavity size was moderately dilated. Wall thickness was increased in a pattern of mild LVH. The estimated ejection fraction was 25%. Diffuse hypokinesis. - Aortic valve: There was mild regurgitation. - Mitral valve: Post repair with ring no signficiant MR and stable low mean gradient Valve area by continuity equation (using LVOT flow): 0.82 cm^2. - Atrial septum: No defect or patent foramen ovale was identified. - Tricuspid valve: There was moderate regurgitation.  09/26/16: TTE Study Conclusions - Left ventricle: The cavity size  was normal. Wall thickness was increased in a pattern of mild LVH. Systolic function was moderately reduced. The estimated ejection fraction was in the range of 35% to 40%. Diffuse hypokinesis. - Aortic valve: There was moderate regurgitation. Valve area (VTI): 1.76 cm^2. Valve area (Vmax): 1.56 cm^2. - Mitral valve: There is a 26 mm Edwards McArthy-Adams anular ring present at the MV anulus. There was no evidence for stenosis. There was no significant regurgitation. Mean gradient (D): 3 mm Hg. - Left atrium: The atrium was mildly dilated. - Pulmonary arteries: PA peak pressure: 32 mm Hg (S). PASP is borderline elevated. - Technically adequate study.  08/21/16: R/LHC  Ost RCA to Prox RCA lesion, 10 %stenosed.  Dist RCA lesion, 30 %stenosed.  Prox Cx to Mid Cx lesion, 10 %stenosed.  Prox LAD to Mid LAD lesion, 30 %stenosed.  1. Patent stent in the Circumflex artery with minimal restenosis 2. Mild non-obstructive disease in the RCA and LAD (of note, there was spasm in the RCA with catheter engagement that resolved with SL NTG) Recommendations: Medical management of CAD. Continue planning for valve repair/replacement.   10/12/15: TTE, LVEF 40-45%   Patient Profile     52 y.o. female with a hx of CAD (MI w/PCT to Cx in 2015), HTN, HLD, severe MR>> MV repair 08/25/2016 (required dental extractions prior), LBBB, chronic CHF, and it seem since her MV repair a chronic chest wall discomfort  Initially sought attention for c/o SOB/DOE in [place], she was admitted with CHF exacerbation 01/16/17 and diuresed transitioned to PO lasix 01/18/17, and echo done there noted reductionin her LVEF from her baseline and she was transferred to [facility] for further care and management.    Assessment & Plan    1. NICM     She has not been able to tolerate ACE/entrsto with hypotension     Maintained on BB and PRN diuretics out patient      Now s/p ICD implant yesterday, Dr. Ladona Ridgel was  unable to place LV lead given patient's anatomy, has HIS and RV lead. Patient with post-procedure pain, now s/p Percocet getting good relief.  Site with small hematoma, monitor Device interrogation this morning with intact function CXR this morning with stable lead placement (reviewed with Dr. Ladona Ridgel), no PTX  Patient is post-pain medicine, wakes easily and is conversation though I will return to discuss wound care, we discussed L arm ROM restriction, will review again with her prior to discharge   Continue ongoing care with primary cardiac team 2. CHF exacerbation     3. VHD Hx of MV repair     Stable by this echo    For questions or updates, please contact [facility] Please consult [email or web address] for contact info under Cardiology/STEMI.      Signed, Sheilah Pigeon, PA-C  01/25/2017, 9:40 AM    EP Attending  Patient seen and examined. Agree with above. She is very sore s/p ICD insertion. Her device has been interogated under my direction and is working normally. QRS is actually a little narrower with His bundle pacing. Advance activity and DC home when stable.   Leonia Reeves.D.

## 2017-01-25 NOTE — Progress Notes (Signed)
Progress Note  Patient Name: Lindsay Camacho Date of Encounter: 01/25/2017  Primary Cardiologist: Lance Muss, MD   Subjective   Day 1 s/p ICD insert. Pt with device pocket hematoma and pain today. No other complaints. No dyspnea.   Inpatient Medications    Scheduled Meds: . aspirin EC  81 mg Oral Daily  . atorvastatin  80 mg Oral Daily  . carvedilol  6.25 mg Oral BID WC  . sodium chloride flush  3 mL Intravenous Q12H   Continuous Infusions: . sodium chloride     PRN Meds: sodium chloride, acetaminophen, hydrocortisone cream, ondansetron (ZOFRAN) IV, sodium chloride flush   Vital Signs    Vitals:   01/24/17 2129 01/24/17 2259 01/25/17 0116 01/25/17 0408  BP: (!) 80/59 96/65 99/62  134/65  Pulse: 80 77 85 85  Resp:    16  Temp:    97.6 F (36.4 C)  TempSrc:    Oral  SpO2: 95% 97% 96% 95%  Weight:    130 lb (59 kg)  Height:        Intake/Output Summary (Last 24 hours) at 01/25/2017 0816 Last data filed at 01/25/2017 0630 Gross per 24 hour  Intake 648 ml  Output -  Net 648 ml   Filed Weights   01/23/17 0419 01/24/17 0630 01/25/17 0408  Weight: 129 lb 3 oz (58.6 kg) 126 lb 1.6 oz (57.2 kg) 130 lb (59 kg)    Telemetry    Paced rthtyhm - Personally Reviewed  ECG    Atrial-sensed ventricular-paced rhythm Biventricular pacemaker detected - Personally Reviewed  Physical Exam   GEN: No acute distress.   Neck: No JVD Cardiac: RRR, no murmurs, rubs, or gallops.  Respiratory: Clear to auscultation bilaterally. GI: Soft, nontender, non-distended  MS: No LEE edema; No deformity. Device pocket hematoma noted, left upper chest wall Neuro:  Nonfocal  Psych: Normal affect   Labs    Chemistry Recent Labs  Lab 01/21/17 2256  01/23/17 0420 01/24/17 0518 01/25/17 0354  NA 139   < > 141 139 136  K 4.0   < > 4.5 4.9 4.0  CL 98*   < > 104 104 106  CO2 30   < > 24 26 19*  GLUCOSE 101*   < > 77 88 129*  BUN 19   < > 13 13 17   CREATININE 1.04*   < > 0.84  0.86 0.83  CALCIUM 9.0   < > 9.3 9.0 8.5*  PROT 6.8  --   --   --   --   ALBUMIN 3.3*  --   --   --   --   AST 32  --   --   --   --   ALT 23  --   --   --   --   ALKPHOS 89  --   --   --   --   BILITOT 0.9  --   --   --   --   GFRNONAA >60   < > >60 >60 >60  GFRAA >60   < > >60 >60 >60  ANIONGAP 11   < > 13 9 11    < > = values in this interval not displayed.     Hematology Recent Labs  Lab 01/21/17 2256  WBC 6.0  RBC 4.91  HGB 13.4  HCT 43.0  MCV 87.6  MCH 27.3  MCHC 31.2  RDW 16.1*  PLT 229    Cardiac EnzymesNo results  for input(s): TROPONINI in the last 168 hours. No results for input(s): TROPIPOC in the last 168 hours.   BNP Recent Labs  Lab 01/21/17 2304  BNP 322.4*     DDimer No results for input(s): DDIMER in the last 168 hours.   Radiology    No results found.  Cardiac Studies/Procedures   2D Echo 01/22/17 Study Conclusions  - Left ventricle: Abnormal septal motion. The cavity size was   moderately dilated. Wall thickness was increased in a pattern of   mild LVH. The estimated ejection fraction was 25%. Diffuse   hypokinesis. - Aortic valve: There was mild regurgitation. - Mitral valve: Post repair with ring no signficiant MR and stable   low mean gradient Valve area by continuity equation (using LVOT   flow): 0.82 cm^2. - Atrial septum: No defect or patent foramen ovale was identified. - Tricuspid valve: There was moderate regurgitation.   Procedures   BIV ICD INSERTION CRT-D 01/24/17  Conclusion   CONCLUSIONS:   1. Nonischemic cardiomyopathy with Left bundle-branch block and chronic New York Heart Association class III heart failure.   2. Successful ICD implantation but unsuccessful LV lead and his bundle lead insertion due to very poor anatomy and a very large RA.   3. No early apparent complications.     Patient Profile     52 y.o.femalewith ah/o CAD s/plateral wall MI in 2015treated with PCI to the CXF. She had a repeat cath  in 09/2014 showing patent stent. She unfortunately had a RP bleed post cath and required vascular surgery. She had another R/LHC prior to valve surgery 08/21/16 that showed patent CFX stent with minal restenosis + mild nonobstructive CAD in the RCA and LAD. She also has h/o chronic systolic HF wpreviousEF of 35-40%, severe MR and tobacco abuse.S/p minimally invasive MV Repair 08/25/16. Transferred from [place] with decrease in EF to 20-25% and was admitted with CHF-she had been SOB for about 1 week.Now s/p ICD implantation.   Assessment & Plan    1. Chronic Systolic CHF/NICM: EF 25%. Euvolemic on exam. Medical therapy limited due to soft BP/ hypotension. Only able to tolerate low dose BB at this time. Can try to adjust regimen, if BP allows, in the outpatient setting.   2. ICD: day 1 s/p implantation. Med[employer]onic device. Successful ICD implantation but unsuccessful LV lead and his bundle lead insertion due to very poor anatomy and a very large RA. Post op CXR shows proper lead placement and no evidence of pneumothorax. Device interrogation shows normal functioning. She has a device pocket hematoma and with moderate amount of pain. Pt evaluated by Dr. Ladona Ridgel, who has recommended pt be monitored an additional day for further observation and pain management. Per Dr. Ladona Ridgel, keep pressure bandage on today. EP to write for percocet.   3. CAD: h/o lateral wall MI in 2015 w/ Lcx PCI + stenting. Repeat cath in 2018 showed patent LCx stent with minimal restenosis and mild nonobstructive CAD in the LAD and RCA. Pt is stable w/o anginal symptoms. Continue medical therapy w/ ASA, statin and BB.   4. Valvular Disease: s/p minimally invasive MV repair in August 2018. Echo shows normally functioning valve and normal gradient.   Dispo: possible d/c home tomorrow.   For questions or updates, please contact [facility] Please consult [email or web address] for contact info under Cardiology/STEMI.       Signed, Robbie Lis, PA-C  01/25/2017, 8:16 AM    History and all data above reviewed.  Patient examined.  I agree with the findings as above.  Soreness on chest.  No acute SOB.  The patient exam reveals COR:RRR  ,  Lungs: Clear  ,  Abd: Positive bowel sounds, no rebound no guarding, Ext No edema, CHEST:  Pressure dressing in place  .  All available labs, radiology testing, previous records reviewed. Agree with documented assessment and plan. Acute systolic HF:  Unable to titrate any meds again today because of low BPs.  She seems to be euvolemic and will continue therapy as listed.  Probably home in AM.    Rollene Rotunda  11:57 AM  01/25/2017

## 2017-01-25 NOTE — Progress Notes (Signed)
Patient with 10/10 incisional pain throughout the night reduced to a tolerable level 4 by 0.5 mg IV dilaudid. Tylenol and oxycodone did not relieve the pain earlier on.

## 2017-01-26 ENCOUNTER — Encounter (HOSPITAL_COMMUNITY): Payer: Self-pay | Admitting: Cardiology

## 2017-01-26 DIAGNOSIS — I428 Other cardiomyopathies: Secondary | ICD-10-CM

## 2017-01-26 DIAGNOSIS — Z95810 Presence of automatic (implantable) cardiac defibrillator: Secondary | ICD-10-CM | POA: Diagnosis not present

## 2017-01-26 HISTORY — DX: Other cardiomyopathies: I42.8

## 2017-01-26 LAB — BASIC METABOLIC PANEL
Anion gap: 9 (ref 5–15)
BUN: 11 mg/dL (ref 6–20)
CHLORIDE: 103 mmol/L (ref 101–111)
CO2: 24 mmol/L (ref 22–32)
Calcium: 8.6 mg/dL — ABNORMAL LOW (ref 8.9–10.3)
Creatinine, Ser: 0.79 mg/dL (ref 0.44–1.00)
GFR calc Af Amer: >60 mL/min (ref >60)
GFR calc non Af Amer: >60 mL/min (ref >60)
GLUCOSE: 86 mg/dL (ref 65–99)
POTASSIUM: 4 mmol/L (ref 3.5–5.1)
SODIUM: 136 mmol/L (ref 135–145)

## 2017-01-26 MED ORDER — CLOPIDOGREL BISULFATE 75 MG PO TABS
75.0000 mg | ORAL_TABLET | Freq: Every day | ORAL | 11 refills | Status: DC
Start: 2017-01-29 — End: 2018-02-18

## 2017-01-26 MED ORDER — OXYCODONE-ACETAMINOPHEN 5-325 MG PO TABS: 1.0000 | ORAL_TABLET | Freq: Four times a day (QID) | ORAL | 0 refills | Status: DC | PRN

## 2017-01-26 NOTE — Discharge Instructions (Signed)
° ° °  Supplemental Discharge Instructions for  Pacemaker/Defibrillator Patients  Activity No heavy lifting or vigorous activity with your left/right arm for 6 to 8 weeks.  Do not raise your left/right arm above your head for one week.  Gradually raise your affected arm as drawn below.              01/28/17                    01/29/17                    01/30/17                  01/31/17 __  NO DRIVING for 1 week  ; you may begin driving on  07/18/94 .  WOUND CARE - Keep the wound area clean and dry.  Do not get this area wet for one week. No showers for one week; you may shower on     . - The tape/steri-strips on your wound will fall off; do not pull them off.  No bandage is needed on the site.  DO  NOT apply any creams, oils, or ointments to the wound area. - If you notice any drainage or discharge from the wound, any swelling or bruising at the site, or you develop a fever > 101? F after you are discharged home, call the office at once.  Special Instructions - You are still able to use cellular telephones; use the ear opposite the side where you have your pacemaker/defibrillator.  Avoid carrying your cellular phone near your device. - When traveling through airports, show security personnel your identification card to avoid being screened in the metal detectors.  Ask the security personnel to use the hand wand. - Avoid arc welding equipment, MRI testing (magnetic resonance imaging), TENS units (transcutaneous nerve stimulators).  Call the office for questions about other devices. - Avoid electrical appliances that are in poor condition or are not properly grounded. - Microwave ovens are safe to be near or to operate.  Additional information for defibrillator patients should your device go off: - If your device goes off ONCE and you feel fine afterward, notify the device clinic nurses. - If your device goes off ONCE and you do not feel well afterward, call 911. - If your device goes off TWICE,  call 911. - If your device goes off THREE times in one day, call 911.  DO NOT DRIVE YOURSELF OR A FAMILY MEMBER WITH A DEFIBRILLATOR TO THE HOSPITAL--CALL 911.  Resume Plavix on Monday.  Remember to weigh yourself daily. For weight gain >3 lbs overnight, take a lasix. For weight gain >5 lbs in one week, take a lasix. If you are unsure what to do, call the cardiology office.   Limit your salt intake to less than 2 grams per day. Limit your fluid intake to less than 2 liters per day.   Try to eat a diet with fresh fruit and vegetables. Avoid salty foods like cheese, processed meats, fried foods, and canned foods.

## 2017-01-26 NOTE — Progress Notes (Addendum)
Progress Note  Patient Name: Lindsay Camacho Date of Encounter: 01/26/2017  Primary Cardiologist: Lance Muss, MD   Subjective   No CP, SOB, or dizziness. Up walking in halls. Still has itching, which is chronic. Reports less pain with ICD incision.  Inpatient Medications    Scheduled Meds: . aspirin EC  81 mg Oral Daily  . atorvastatin  80 mg Oral Daily  . carvedilol  6.25 mg Oral BID WC  . sodium chloride flush  3 mL Intravenous Q12H   Continuous Infusions: . sodium chloride     PRN Meds: sodium chloride, acetaminophen, hydrocortisone cream, ondansetron (ZOFRAN) IV, oxyCODONE-acetaminophen, sodium chloride flush   Vital Signs    Vitals:   01/25/17 1320 01/25/17 1740 01/25/17 2105 01/26/17 0606  BP: (!) 92/59 96/71 121/60 (!) 103/57  Pulse:  87 83   Resp:   17 18  Temp:   98.1 F (36.7 C) 97.8 F (36.6 C)  TempSrc:   Oral Oral  SpO2:   97% 99%  Weight:    129 lb 14.4 oz (58.9 kg)  Height:        Intake/Output Summary (Last 24 hours) at 01/26/2017 0827 Last data filed at 01/25/2017 1759 Gross per 24 hour  Intake 702 ml  Output 550 ml  Net 152 ml   Filed Weights   01/24/17 0630 01/25/17 0408 01/26/17 0606  Weight: 126 lb 1.6 oz (57.2 kg) 130 lb (59 kg) 129 lb 14.4 oz (58.9 kg)    Telemetry    V-pacing 70-80's. QRS still ~140. Had some AV pacing around 1:30 am - Personally Reviewed  ECG    No new tracings. - Personally Reviewed  Physical Exam   General: Well appearing. No resp difficulty. HEENT: Normal Neck: Supple. No JVD. Carotids 2+ bilat; no bruits. No thyromegaly or nodule noted. Cor: PMI nondisplaced. RRR, No M/G/R noted, distant HS Lungs: Diminished throughout Abdomen: Soft, non-tender, non-distended, no HSM. No bruits or masses. +BS  Extremities: No cyanosis, clubbing, or rash. R and LLE no edema. ICD incision with steri-strips LU chest. Small amount of old blood, but no hematoma Neuro: Alert & orientedx3, cranial nerves grossly  intact. moves all 4 extremities w/o difficulty. Affect pleasant  Labs    Chemistry Recent Labs  Lab 01/21/17 2256  01/24/17 0518 01/25/17 0354 01/26/17 0508  NA 139   < > 139 136 136  K 4.0   < > 4.9 4.0 4.0  CL 98*   < > 104 106 103  CO2 30   < > 26 19* 24  GLUCOSE 101*   < > 88 129* 86  BUN 19   < > 13 17 11   CREATININE 1.04*   < > 0.86 0.83 0.79  CALCIUM 9.0   < > 9.0 8.5* 8.6*  PROT 6.8  --   --   --   --   ALBUMIN 3.3*  --   --   --   --   AST 32  --   --   --   --   ALT 23  --   --   --   --   ALKPHOS 89  --   --   --   --   BILITOT 0.9  --   --   --   --   GFRNONAA >60   < > >60 >60 >60  GFRAA >60   < > >60 >60 >60  ANIONGAP 11   < > 9 11 9    < > =  values in this interval not displayed.     Hematology Recent Labs  Lab 01/21/17 2256  WBC 6.0  RBC 4.91  HGB 13.4  HCT 43.0  MCV 87.6  MCH 27.3  MCHC 31.2  RDW 16.1*  PLT 229    Cardiac EnzymesNo results for input(s): TROPONINI in the last 168 hours. No results for input(s): TROPIPOC in the last 168 hours.   BNP Recent Labs  Lab 01/21/17 2304  BNP 322.4*     DDimer No results for input(s): DDIMER in the last 168 hours.   Radiology    Dg Chest 2 View  Result Date: 01/25/2017 CLINICAL DATA:  Encounter for ICD placement EXAM: CHEST  2 VIEW COMPARISON:  09/26/2016 FINDINGS: There is a dual-chamber pacer from the left with ICD. 2 right atrial leads are noted. Mild cardiomegaly. Status post mitral valve repair. Linear opacity in the right mid lung which may have decreased. This was not present on 08/22/2016 chest CT and developed after surgery, presumably scarring. No edema, effusion, or pneumothorax. IMPRESSION: No acute finding after dual-chamber pacer implant. Electronically Signed   By: Marnee Spring M.D.   On: 01/25/2017 08:23    Cardiac Studies/Procedures   2D Echo 01/22/17 Study Conclusions  - Left ventricle: Abnormal septal motion. The cavity size was   moderately dilated. Wall thickness was  increased in a pattern of   mild LVH. The estimated ejection fraction was 25%. Diffuse   hypokinesis. - Aortic valve: There was mild regurgitation. - Mitral valve: Post repair with ring no signficiant MR and stable   low mean gradient Valve area by continuity equation (using LVOT   flow): 0.82 cm^2. - Atrial septum: No defect or patent foramen ovale was identified. - Tricuspid valve: There was moderate regurgitation.   Procedures   BIV ICD INSERTION CRT-D 01/24/17  Conclusion   CONCLUSIONS:   1. Nonischemic cardiomyopathy with Left bundle-branch block and chronic New York Heart Association class III heart failure.   2. Successful ICD implantation but unsuccessful LV lead and his bundle lead insertion due to very poor anatomy and a very large RA.   3. No early apparent complications.     Patient Profile     52 y.o.femalewith ah/o CAD s/plateral wall MI in 2015treated with PCI to the CXF. She had a repeat cath in 09/2014 showing patent stent. She unfortunately had a RP bleed post cath and required vascular surgery. She had another R/LHC prior to valve surgery 08/21/16 that showed patent CFX stent with minal restenosis + mild nonobstructive CAD in the RCA and LAD. She also has h/o chronic systolic HF wpreviousEF of 35-40%, severe MR and tobacco abuse.S/p minimally invasive MV Repair 08/25/16. Transferred from [place] with decrease in EF to 20-25% and was admitted with CHF-she had been SOB for about 1 week.Now s/p ICD implantation.   Assessment & Plan    1. Chronic Systolic CHF/NICM: EF 25% on 01/22/17.  - Euvolemic on exam. Weight unchanged.  - Medical therapy limited due to hypotension with ACE and entresto. Only able to tolerate low dose BB at this time and PRN diuretics.  - Consider switching carvedilol to Toprol XL for less BP effect. SBP 80-100's, but asymptomatic.  2. ICD: POD 2 Medtronic ICD. Unable to place LV.  She does have a HIS lead.  Post op CXR with proper lead  placement and interrogation with normal functioning.  - Site with small hematoma and pain yesterday, so kept overnight for observation with pressure dressing. Pain  and hematoma improved. Pressure dressing removed by EP this am.  3. CAD: h/o lateral wall MI in 2015 w/ Lcx PCI + stenting. Repeat cath in 2018 showed patent LCx stent with minimal restenosis and mild nonobstructive CAD in the LAD and RCA.  - No CP or SOB.  - Continue medical therapy w/ ASA daily, 80 atorvastatin daily, and carvedilol 6.25 BID.  - Encouraged tobacco cessation.  4. Valvular Disease: s/p minimally invasive MV repair in August 2018.  - Echo 01/22/17 shows normally functioning valve and normal gradient.   Dispo: Probable D/C today  For questions or updates, please contact [facility] Please consult [email or web address] for contact info under Cardiology/STEMI.      Signed, Alford Highland, NP  01/26/2017, 8:27 AM    History and all data above reviewed.  Patient examined.  I agree with the findings as above. Mild chest wall pain with the device.  The patient exam reveals COR:RRR  ,  Lungs: Clear  ,  Abd: Positive bowel sounds, no rebound no guarding, Ext No edema, Chest:  Mild bruising and hematoma.  .  All available labs, radiology testing, previous records reviewed. Agree with documented assessment and plan. OK to send home.  She has 100% vpacing with lead placed in the HIS.  Not able to tolerate meds.  Follow with Dr. Eldridge Dace.  Can go home with a few Vicodin for pain.   [name] Chandlar Guice  11:34 AM  01/26/2017

## 2017-01-26 NOTE — Progress Notes (Signed)
Patient c/o 8 to 10/10 pain on incision site. PRN Oxycodone given was effective.

## 2017-01-26 NOTE — Discharge Summary (Signed)
Discharge Summary    Patient ID: Lindsay Camacho,  MRN: [id number], DOB/AGE: 1965/09/25 52 y.o.  Admit date: 01/21/2017 Discharge date: 01/26/2017  Primary Care Provider: No primary care provider on file. Primary Cardiologist: Lance Muss, MD  Discharge Diagnoses    Principal Problem:   Nonischemic cardiomyopathy [facility]) Active Problems:   Tobacco abuse   CAD - CFX DES 01/17/13   Mitral regurgitation   ICD (implantable cardioverter-defibrillator) in place   Allergies Allergies  Allergen Reactions  . Aldomet [Methyldopa] Other (See Comments)    Severe hypotension  . Brilinta [Ticagrelor] Shortness Of Breath  . Other Other (See Comments)    Nuclear Stress Test Medication caused seizures  . Coumadin [Warfarin Sodium] Swelling    Arm swelled  . Penicillins Rash    Has patient had a PCN reaction causing immediate rash, facial/tongue/throat swelling, SOB or lightheadedness with hypotension: Yes Has patient had a PCN reaction causing severe rash involving mucus membranes or skin necrosis: No Has patient had a PCN reaction that required hospitalization: No Has patient had a PCN reaction occurring within the last 10 years: No If all of the above answers are "NO", then may proceed with Cephalosporin use.     Diagnostic Studies/Procedures    2D Echo 01/22/17 Study Conclusions - Left ventricle: Abnormal septal motion. The cavity size was moderately dilated. Wall thickness was increased in a pattern of mild LVH. The estimated ejection fraction was 25%. Diffuse hypokinesis. - Aortic valve: There was mild regurgitation. - Mitral valve: Post repair with ring no signficiant MR and stable low mean gradient Valve area by continuity equation (using LVOT flow): 0.82 cm^2. - Atrial septum: No defect or patent foramen ovale was identified. - Tricuspid valve: There was moderate regurgitation.  Procedures   BIV ICD INSERTION CRT-D 01/24/17  Conclusion   CONCLUSIONS:   1. Nonischemic cardiomyopathy with Left bundle-branch block and chronic New York Heart Association class III heart failure.  2. Successful ICD implantation but unsuccessful LV lead and his bundle lead insertion due to very poor anatomy and a very large RA.  3. No early apparent complications.      History of Present Illness     Lindsay Camacho is a 52 y.o. female  with ah/o CAD s/plateral wall MI in 2015treated with PCI to the CXF. She had a repeat cath in 09/2014 showing patent stent. She unfortunately had a RP bleed post cath and required vascular surgery. She had another R/LHC prior to valve surgery 08/21/16 that showed patent CFX stent with minal restenosis + mild nonobstructive CAD in the RCA and LAD. Severe MR s/p minimally invasive mitrval valve repair with Dr Cornelius Moras (08/2016). She also has h/o chronic systolic HF w/ EF of 35-40%, and tobacco abuse.   Hospital Course     Consultants: EP: Dr. Ladona Ridgel   Chronic Systolic HF, NICM, EF 25%  Originally admitted in [place], [place] on 1/15 with worsening SOB r/t A/C systolic heart failure. She was diuresed with IV lasix and was transitioned to PO. She was transferred on 1/20 for ICD evaluation after an echo showed worsening EF of 20-25% (35-40% 09/2016). She did not require further diuresis. Successful Medtronic ICD implantation on 1/23 but unsuccessful LV lead due to anatomy. Has HIS and RV lead. Interrogation and CXR normal postop. Complication of hematoma with moderate pain at insertion site, so she was kept an additional night for observation. Only able to tolerate low dose BB due to hypotension. Lasix only as needed.  Will give her a few Vicodin for pain control.  CAD No CP or SOB. Most recent cath 08/21/16 showed patent CFX with minimal restenosis + mild nonobstructive CAD in RCA and LAD. Continue asa, statin, and beta blocker. Will hold plavix until Monday given recent hematoma and no recent PCI.   Tobacco use She is a current every day smoker.  Encouraged cessation.  Valvular disease Admitted in August 2018 for minimally invasive mitral valve repair with Dr. Cornelius Moras. Echo 01/22/17 showed no significant MR and stable low mean gradient.   On day of discharge, pt up and ambulating in the hallway. Seen and evaluated by Dr Antoine Poche and determined to be in stable condition and ready for discharge to home.  _____________  Discharge Vitals Blood pressure 134/69, pulse 83, temperature 97.8 F (36.6 C), temperature source Oral, resp. rate 18, height 5\' 1"  (1.549 m), weight 129 lb 14.4 oz (58.9 kg), SpO2 99 %.  Filed Weights   01/24/17 0630 01/25/17 0408 01/26/17 0606  Weight: 126 lb 1.6 oz (57.2 kg) 130 lb (59 kg) 129 lb 14.4 oz (58.9 kg)    Labs & Radiologic Studies    CBC No results for input(s): WBC, NEUTROABS, HGB, HCT, MCV, PLT in the last 72 hours. Basic Metabolic Panel Recent Labs    16/10/96 0354 01/26/17 0508  NA 136 136  K 4.0 4.0  CL 106 103  CO2 19* 24  GLUCOSE 129* 86  BUN 17 11  CREATININE 0.83 0.79  CALCIUM 8.5* 8.6*   Liver Function Tests No results for input(s): AST, ALT, ALKPHOS, BILITOT, PROT, ALBUMIN in the last 72 hours. No results for input(s): LIPASE, AMYLASE in the last 72 hours. Cardiac Enzymes No results for input(s): CKTOTAL, CKMB, CKMBINDEX, TROPONINI in the last 72 hours. BNP Invalid input(s): POCBNP D-Dimer No results for input(s): DDIMER in the last 72 hours. Hemoglobin A1C No results for input(s): HGBA1C in the last 72 hours. Fasting Lipid Panel No results for input(s): CHOL, HDL, LDLCALC, TRIG, CHOLHDL, LDLDIRECT in the last 72 hours. Thyroid Function Tests No results for input(s): TSH, T4TOTAL, T3FREE, THYROIDAB in the last 72 hours.  Invalid input(s): FREET3 _____________  Dg Chest 2 View  Result Date: 01/25/2017 CLINICAL DATA:  Encounter for ICD placement EXAM: CHEST  2 VIEW COMPARISON:  09/26/2016 FINDINGS: There is a dual-chamber pacer from the left with ICD. 2 right atrial leads  are noted. Mild cardiomegaly. Status post mitral valve repair. Linear opacity in the right mid lung which may have decreased. This was not present on 08/22/2016 chest CT and developed after surgery, presumably scarring. No edema, effusion, or pneumothorax. IMPRESSION: No acute finding after dual-chamber pacer implant. Electronically Signed   By: Marnee Spring M.D.   On: 01/25/2017 08:23   Disposition   Pt is being discharged home today in good condition. Seen by Dr. Antoine Poche.   Follow-up Plans & Appointments    Follow-up Information    [facility] [address] [facility] Follow up on 02/06/2017.   Specialty:  Cardiology Why:  2:00PM, wound check visit Contact information: [address] [place] [address] [phone]       Marinus Maw, MD Follow up on 04/27/2017.   Specialty:  Cardiology Why:  10:00AM Contact information: [address] [place] [address] [phone]        Corky Crafts, MD Follow up on 02/01/2017.   Specialties:  Cardiology, Radiology, Interventional Cardiology Why:  09:20 am Contact information:  [address] [place] [address] [phone]            Discharge Medications   Allergies as of 01/26/2017      Reactions   Aldomet [methyldopa] Other (See Comments)   Severe hypotension   Brilinta [ticagrelor] Shortness Of Breath   Other Other (See Comments)   Nuclear Stress Test Medication caused seizures   Coumadin [warfarin Sodium] Swelling   Arm swelled   Penicillins Rash   Has patient had a PCN reaction causing immediate rash, facial/tongue/throat swelling, SOB or lightheadedness with hypotension: Yes Has patient had a PCN reaction causing severe rash involving mucus membranes or skin necrosis: No Has patient had a PCN reaction that required hospitalization: No Has patient had a PCN reaction occurring within the last 10 years: No If all of the above answers are  "NO", then may proceed with Cephalosporin use.      Medication List    TAKE these medications   acetaminophen 500 MG tablet Commonly known as:  TYLENOL Take 1,000 mg by mouth every 6 (six) hours as needed for headache (pain).   aspirin EC 81 MG tablet Take 81 mg by mouth daily.   atorvastatin 80 MG tablet Commonly known as:  LIPITOR Take 1 tablet (80 mg total) by mouth daily. What changed:  when to take this   carvedilol 6.25 MG tablet Commonly known as:  COREG Take 6.25 mg by mouth 2 (two) times daily with a meal.   clopidogrel 75 MG tablet Commonly known as:  PLAVIX Take 1 tablet (75 mg total) by mouth daily with breakfast. Start taking on:  01/29/2017 What changed:  These instructions start on 01/29/2017. If you are unsure what to do until then, ask your doctor or other care provider. Notes to patient:  Resume on Monday, 1/28.   furosemide 20 MG tablet Commonly known as:  LASIX Take 20 mg by mouth daily as needed for fluid or edema (shortness of breath).   oxyCODONE-acetaminophen 5-325 MG tablet Commonly known as:  PERCOCET/ROXICET Take 1-2 tablets by mouth every 6 (six) hours as needed for severe pain (Don't take with tylenol/acetaminophen).        Outstanding Labs/Studies   BMP  Duration of Discharge Encounter   Greater than 30 minutes including physician time.  Signed, Alford Highland NP 01/26/2017, 1:40 PM

## 2017-01-26 NOTE — Progress Notes (Signed)
Progress Note  Patient Name: Lindsay Camacho Date of Encounter: 01/26/2017  Primary Cardiologist: Lance Muss, MD   Subjective   Feeling much better today, significantly less site discomfort, hoping to go home.  No CP, palpitations, SOB.  She c/o generalized itching, this pre-dates her hospitalization  Inpatient Medications    Scheduled Meds: . aspirin EC  81 mg Oral Daily  . atorvastatin  80 mg Oral Daily  . carvedilol  6.25 mg Oral BID WC  . sodium chloride flush  3 mL Intravenous Q12H   Continuous Infusions: . sodium chloride     PRN Meds: sodium chloride, acetaminophen, hydrocortisone cream, ondansetron (ZOFRAN) IV, oxyCODONE-acetaminophen, sodium chloride flush   Vital Signs    Vitals:   01/25/17 1320 01/25/17 1740 01/25/17 2105 01/26/17 0606  BP: (!) 92/59 96/71 121/60 (!) 103/57  Pulse:  87 83   Resp:   17 18  Temp:   98.1 F (36.7 C) 97.8 F (36.6 C)  TempSrc:   Oral Oral  SpO2:   97% 99%  Weight:    129 lb 14.4 oz (58.9 kg)  Height:        Intake/Output Summary (Last 24 hours) at 01/26/2017 0901 Last data filed at 01/25/2017 1759 Gross per 24 hour  Intake 702 ml  Output 550 ml  Net 152 ml   Filed Weights   01/24/17 0630 01/25/17 0408 01/26/17 0606  Weight: 126 lb 1.6 oz (57.2 kg) 130 lb (59 kg) 129 lb 14.4 oz (58.9 kg)    Telemetry    SR/V paced - Personally Reviewed  ECG    No new EKGs - Personally Reviewed  Physical Exam   GEN: No acute distress.   Neck: No JVD Cardiac: RRR, no murmurs, rubs, or gallops.  Respiratory: CTA b/l. GI: Soft, nontender, non-distended  MS: No edema; No deformity. Neuro:  Nonfocal  Psych: Normal affect  L chest, implant site resolved hematoma, minimal ecchymosis lateral edge  Labs    Chemistry Recent Labs  Lab 01/21/17 2256  01/24/17 0518 01/25/17 0354 01/26/17 0508  NA 139   < > 139 136 136  K 4.0   < > 4.9 4.0 4.0  CL 98*   < > 104 106 103  CO2 30   < > 26 19* 24  GLUCOSE 101*   < > 88  129* 86  BUN 19   < > 13 17 11   CREATININE 1.04*   < > 0.86 0.83 0.79  CALCIUM 9.0   < > 9.0 8.5* 8.6*  PROT 6.8  --   --   --   --   ALBUMIN 3.3*  --   --   --   --   AST 32  --   --   --   --   ALT 23  --   --   --   --   ALKPHOS 89  --   --   --   --   BILITOT 0.9  --   --   --   --   GFRNONAA >60   < > >60 >60 >60  GFRAA >60   < > >60 >60 >60  ANIONGAP 11   < > 9 11 9    < > = values in this interval not displayed.     Hematology Recent Labs  Lab 01/21/17 2256  WBC 6.0  RBC 4.91  HGB 13.4  HCT 43.0  MCV 87.6  MCH 27.3  MCHC 31.2  RDW 16.1*  PLT 229    Cardiac EnzymesNo results for input(s): TROPONINI in the last 168 hours. No results for input(s): TROPIPOC in the last 168 hours.   BNP Recent Labs  Lab 01/21/17 2304  BNP 322.4*     DDimer No results for input(s): DDIMER in the last 168 hours.   Radiology    Dg Chest 2 View Result Date: 01/25/2017 CLINICAL DATA:  Encounter for ICD placement EXAM: CHEST  2 VIEW COMPARISON:  09/26/2016 FINDINGS: There is a dual-chamber pacer from the left with ICD. 2 right atrial leads are noted. Mild cardiomegaly. Status post mitral valve repair. Linear opacity in the right mid lung which may have decreased. This was not present on 08/22/2016 chest CT and developed after surgery, presumably scarring. No edema, effusion, or pneumothorax. IMPRESSION: No acute finding after dual-chamber pacer implant. Electronically Signed   By: Marnee Spring M.D.   On: 01/25/2017 08:23    Cardiac Studies   01/22/17: TTE Study Conclusions - Left ventricle: Abnormal septal motion. The cavity size was moderately dilated. Wall thickness was increased in a pattern of mild LVH. The estimated ejection fraction was 25%. Diffuse hypokinesis. - Aortic valve: There was mild regurgitation. - Mitral valve: Post repair with ring no signficiant MR and stable low mean gradient Valve area by continuity equation (using LVOT flow): 0.82 cm^2. -  Atrial septum: No defect or patent foramen ovale was identified. - Tricuspid valve: There was moderate regurgitation.  09/26/16: TTE Study Conclusions - Left ventricle: The cavity size was normal. Wall thickness was increased in a pattern of mild LVH. Systolic function was moderately reduced. The estimated ejection fraction was in the range of 35% to 40%. Diffuse hypokinesis. - Aortic valve: There was moderate regurgitation. Valve area (VTI): 1.76 cm^2. Valve area (Vmax): 1.56 cm^2. - Mitral valve: There is a 26 mm Edwards McArthy-Adams anular ring present at the MV anulus. There was no evidence for stenosis. There was no significant regurgitation. Mean gradient (D): 3 mm Hg. - Left atrium: The atrium was mildly dilated. - Pulmonary arteries: PA peak pressure: 32 mm Hg (S). PASP is borderline elevated. - Technically adequate study.  08/21/16: R/LHC  Ost RCA to Prox RCA lesion, 10 %stenosed.  Dist RCA lesion, 30 %stenosed.  Prox Cx to Mid Cx lesion, 10 %stenosed.  Prox LAD to Mid LAD lesion, 30 %stenosed.  1. Patent stent in the Circumflex artery with minimal restenosis 2. Mild non-obstructive disease in the RCA and LAD (of note, there was spasm in the RCA with catheter engagement that resolved with SL NTG) Recommendations: Medical management of CAD. Continue planning for valve repair/replacement.   10/12/15: TTE, LVEF 40-45%   Patient Profile     52 y.o. female with a hx of CAD (MI w/PCT to Cx in 2015), HTN, HLD, severe MR>> MV repair 08/25/2016 (required dental extractions prior), LBBB, chronic CHF, and it seem since her MV repair a chronic chest wall discomfort  Initially sought attention for c/o SOB/DOE in [place], she was admitted with CHF exacerbation 01/16/17 and diuresed transitioned to PO lasix 01/18/17, and echo done there noted reductionin her LVEF from her baseline and she was transferred to [facility] for further care and management.    Assessment &  Plan    1. NICM     She has not been able to tolerate ACE/entrsto with hypotension     Maintained on BB and PRN diuretics out patient      Now s/p ICD implant yesterday,  Dr. Ladona Ridgel was unable to place LV lead given patient's anatomy, has HIS and RV lead.  Device interrogation POD #1 noted intact function CXR POD #1 with stable lead placement (reviewed with Dr. Ladona Ridgel), no PTX Wound care and activity restrictions were discussed with the patient Routine ICD f/u has been arranged  Pressure dressing removed, no hematoma, minimal ecchymosis lateral edge, looks good Much less pain.   Continue ongoing care with primary cardiac team EP service remains available, please recall if needed  2. CHF exacerbation     3. VHD Hx of MV repair     Stable by this echo    For questions or updates, please contact [facility] Please consult [email or web address] for contact info under Cardiology/STEMI.      Signed, Lindsay Pigeon, PA-C  01/26/2017, 9:01 AM    EP Attending  Patient seen and examined. Agree with the findings as noted above. The patient appears better today and blood pressure has improved. She will follow up for device incision check in 2 weeks. I'll defer decision to re-initiate attempts to start Entresto to her primary cardiology team.   Lindsay Camacho.D.

## 2017-01-26 NOTE — Progress Notes (Addendum)
Discharge instructions reviewed with pt.  Pt dose not have any questions at this time. No hematoma or bleeding at incision site. Pt states she is ready to be discharged. Prescription given to pt.

## 2017-01-29 NOTE — Progress Notes (Signed)
Cardiology Office Note   Date:  02/01/2017   ID:  Lindsay Camacho, DOB [birth year], MRN [id number]  PCP:  Montez Hageman, DO    No chief complaint on file. f/u heart failure   Wt Readings from Last 3 Encounters:  02/01/17 129 lb 6.4 oz (58.7 kg)  01/26/17 129 lb 14.4 oz (58.9 kg)  01/01/17 133 lb 6.4 oz (60.5 kg)       History of Present Illness: Lindsay Camacho is a 52 y.o. female with a h/o CAD s/plateral wall MI in 2015treated with PCI to the CXF. She had a repeat cath in 09/2014 showing patent stent. She unfortunately had a RP bleed post cath and required vascular surgery. She had another R/LHC prior to valve surgery 08/21/16 that showed patent CFX stent with minal restenosis + mild nonobstructive CAD in the RCA and LAD. She also has h/o chronic systolic HF w/ EF of 35-40%, severe MR and tobacco abuse.   She underwent minimally invasive mitral valve repair, per Dr. Cornelius Moras, for severe symptomatic MR. Surgery was 08/25/16. Post op recovery was fairly uneventfully.      She was admitted in [place], [place] on 1/15 with worsening SOB r/t A/C systolic heart failure. She was diuresed with IV lasix and was transitioned to PO. She was transferred on 1/20 for ICD evaluation after an echo showed worsening EF of 20-25% (35-40% 09/2016). She did not require further diuresis. Successful Medtronic ICD implantation on 1/23 but unsuccessful LV lead due to anatomy. Has HIS and RV lead. Interrogation and CXR normal postop. Complication of hematoma with moderate pain at insertion site, so she was kept an additional night for observation. Only able to tolerate low dose BB due to hypotension. Lasix only as needed.  She continues to have some pain at the insertion site of the AICD.  She has a wound check coming up.    She has used some Lasix for subjective volume overload.   Past Medical History:  Diagnosis Date  . Acute myocardial infarction of other lateral wall, initial episode of care 01/2013   DES  CFX  . Aortoiliac occlusive disease (HCC) 08/22/2016  . CAD (coronary artery disease) 11/13/2011   50% ? proximal LCx stenosis per Cath at [facility]  . Chest pain, atypical, muscular skelatal   01/09/2014  . Dyspnea   . Heart failure (HCC)   . HLD (hyperlipidemia)   . Hypertension   . Intermediate coronary syndrome (HCC)   . LBBB (left bundle branch block)   . Mitral regurgitation   . Nonischemic cardiomyopathy (HCC) 01/26/2017  . PVD (peripheral vascular disease) (HCC)    Right CFA stenosis per report on 11/14/2011  . 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/25/2016   Edwards McArthy-Adams IMR ETLogix ring annuloplasty (Model 4100, Serial [id number], size 26) placed via right mini thoracotomy approach  . Stenosis of left subclavian artery (HCC) 08/22/2016  . Stroke [facility])    tia with no deficits    Past Surgical History:  Procedure Laterality Date  . APPENDECTOMY    . BIV ICD INSERTION CRT-D N/A 01/24/2017   Procedure: BIV ICD INSERTION CRT-D;  Surgeon: Marinus Maw, MD;  Location: [facility] INVASIVE CV LAB;  Service: Cardiovascular;  Laterality: N/A;  . CARDIAC CATHETERIZATION  04/14/2013   Non-obstructive disease, patent CFX stent  . CARDIAC CATHETERIZATION N/A 09/21/2014   Procedure: Left Heart Cath and Coronary Angiography;  Surgeon: Lennette Bihari, MD;  Location: [facility] INVASIVE CV LAB;  Service:  Cardiovascular;  Laterality: N/A;  . CORONARY ANGIOPLASTY WITH STENT PLACEMENT  01/17/2013   mild disease except 99% CFX, rx with  2.5 x 28 Alpine drug-eluting stent   . GROIN DEBRIDEMENT Right 04/14/2013   Procedure: Emergency Evacuation of Retroperitoneal Hematoma and Repair Right External Iliac Artery Pseudoaneurysm    ;  Surgeon: Sherren Kerns, MD;  Location: [facility] OR;  Service: Vascular;  Laterality: Right;  . LEFT HEART CATHETERIZATION WITH CORONARY ANGIOGRAM N/A 01/18/2013   Procedure: LEFT HEART CATHETERIZATION WITH CORONARY ANGIOGRAM;  Surgeon: Corky Crafts, MD;  Location: [facility] CATH LAB;   Service: Cardiovascular;  Laterality: N/A;  . LEFT HEART CATHETERIZATION WITH CORONARY ANGIOGRAM N/A 04/14/2013   Procedure: LEFT HEART CATHETERIZATION WITH CORONARY ANGIOGRAM;  Surgeon: Corky Crafts, MD;  Location: [place] CATH LAB;  Service: Cardiovascular;  Laterality: N/A;  . MITRAL VALVE REPAIR Right 08/25/2016   Procedure: MINIMALLY INVASIVE MITRAL VALVE REPAIR;  Surgeon: Purcell Nails, MD;  Location: [facility] OR;  Service: Open Heart Surgery;  Laterality: Right;  . MULTIPLE EXTRACTIONS WITH ALVEOLOPLASTY N/A 08/23/2016   Procedure: Extraction of tooth #'s 17, 20-23, 26-29 with alveoloplasty;  Surgeon: [name], DDS;  Location: [facility] OR;  Service: Oral Surgery;  Laterality: N/A;  . PERCUTANEOUS CORONARY STENT INTERVENTION (PCI-S)  01/18/2013   Procedure: PERCUTANEOUS CORONARY STENT INTERVENTION (PCI-S);  Surgeon: Corky Crafts, MD;  Location: [facility] CATH LAB;  Service: Cardiovascular;;  . RIGHT/LEFT HEART CATH AND CORONARY ANGIOGRAPHY N/A 08/21/2016   Procedure: RIGHT/LEFT HEART CATH AND CORONARY ANGIOGRAPHY;  Surgeon: Kathleene Hazel, MD;  Location: [facility] INVASIVE CV LAB;  Service: Cardiovascular;  Laterality: N/A;  . TEE WITHOUT CARDIOVERSION N/A 03/06/2014   Procedure: TRANSESOPHAGEAL ECHOCARDIOGRAM (TEE);  Surgeon: Lars Masson, MD;  Location: [facility] ENDOSCOPY;  Service: Cardiovascular;  Laterality: N/A;  . TEE WITHOUT CARDIOVERSION N/A 08/18/2016   Procedure: TRANSESOPHAGEAL ECHOCARDIOGRAM (TEE);  Surgeon: Lewayne Bunting, MD;  Location: [facility] ENDOSCOPY;  Service: Cardiovascular;  Laterality: N/A;  . TEE WITHOUT CARDIOVERSION N/A 08/25/2016   Procedure: TRANSESOPHAGEAL ECHOCARDIOGRAM (TEE);  Surgeon: Purcell Nails, MD;  Location: [facility] OR;  Service: Open Heart Surgery;  Laterality: N/A;  . TUBAL LIGATION       Current Outpatient Medications  Medication Sig Dispense Refill  . acetaminophen (TYLENOL) 500 MG tablet Take 1,000 mg by mouth every 6 (six) hours as needed for headache  (pain).    Marland Kitchen aspirin EC 81 MG tablet Take 81 mg by mouth daily.    Marland Kitchen atorvastatin (LIPITOR) 80 MG tablet Take 1 tablet (80 mg total) by mouth daily. (Patient taking differently: Take 80 mg by mouth at bedtime. ) 30 tablet 11  . carvedilol (COREG) 6.25 MG tablet Take 6.25 mg by mouth 2 (two) times daily with a meal.    . clopidogrel (PLAVIX) 75 MG tablet Take 1 tablet (75 mg total) by mouth daily with breakfast. 30 tablet 11  . furosemide (LASIX) 40 MG tablet Take 40 mg by mouth daily as needed for fluid or edema (shortness of breath).     Marland Kitchen oxyCODONE-acetaminophen (PERCOCET/ROXICET) 5-325 MG tablet Take 1-2 tablets by mouth every 6 (six) hours as needed for severe pain (Don't take with tylenol/acetaminophen). 6 tablet 0   No current facility-administered medications for this visit.     Allergies:   Aldomet [methyldopa]; Brilinta [ticagrelor]; Other; Coumadin [warfarin sodium]; and Penicillins    Social History:  The patient  reports that she quit smoking about 17 months ago. Her smoking use  included cigarettes. She smoked 0.50 packs per day. she has never used smokeless tobacco. She reports that she uses drugs. Drug: Marijuana. She reports that she does not drink alcohol.   Family History:  The patient's family history includes Bladder Cancer in her mother; CAD (age of onset: 3) in her mother; CAD (age of onset: 8) in her father; Heart attack in her father and mother; Heart disease in her father and mother; Hypertension in her mother; Lung cancer in her mother; Stroke in her father and mother.    ROS:  Please see the history of present illness.   Otherwise, review of systems are positive for pain at the site.   All other systems are reviewed and negative.    PHYSICAL EXAM: VS:  BP 114/78   Pulse 66   Ht 5\' 1"  (1.549 m)   Wt 129 lb 6.4 oz (58.7 kg)   SpO2 93%   BMI 24.45 kg/m  , BMI Body mass index is 24.45 kg/m. GEN: Well nourished, well developed, in no acute distress  HEENT:  normal  Neck: no JVD, carotid bruits, or masses Cardiac: RRR; no murmurs, rubs, or gallops,no edema  Respiratory:  clear to auscultation bilaterally, normal work of breathing GI: soft, nontender, nondistended, + BS MS: no deformity or atrophy ; mildly tender AICD insertion site with steri-strips in place Skin: warm and dry, no rash Neuro:  Strength and sensation are intact Psych: euthymic mood, full affect     Recent Labs: 01/21/2017: ALT 23; B Natriuretic Peptide 322.4; Hemoglobin 13.4; Magnesium 2.0; Platelets 229; TSH 1.700 01/26/2017: BUN 11; Creatinine, Ser 0.79; Potassium 4.0; Sodium 136   Lipid Panel    Component Value Date/Time   CHOL 158 01/23/2017 0420   CHOL 274 (H) 04/04/2016 0917   TRIG 126 01/23/2017 0420   HDL 31 (L) 01/23/2017 0420   HDL 42 04/04/2016 0917   CHOLHDL 5.1 01/23/2017 0420   VLDL 25 01/23/2017 0420   LDLCALC 102 (H) 01/23/2017 0420   LDLCALC 213 (H) 04/04/2016 1610     Other studies Reviewed: Additional studies/ records that were reviewed today with results demonstrating: Hospital records reviewed.   ASSESSMENT AND PLAN:  1. CAD/Old MI: Angina controlled on medical therapy at this time.  She has pain at her defibrillator insertion site. 2. MV repair: Needs SBE prophylaxis.  Now has developed decreased LVEF.  Now with defibrillator. 3. Chronic systolic heart failure: Now with AICD.  Appears euvolemic at this time.  Continue to use Lasix as needed.  Avoid salt in her diet. 4. Hyperlipidemia: Would like to see LDL around 70.  May need to add Zetia in the future.  We will let her first heal from her defibrillator before making any further changes. 5. Of note, she is looking to apply for disability as well.   Current medicines are reviewed at length with the patient today.  The patient concerns regarding her medicines were addressed.  The following changes have been made:  No change  Labs/ tests ordered today include:  No orders of the defined  types were placed in this encounter.   Recommend 150 minutes/week of aerobic exercise Low fat, low carb, high fiber diet recommended  Disposition:   FU in 3 months with APP; 6 months with me as scheduled.   Signed, Lance Muss, MD  02/01/2017 9:51 AM    [facility] [address] [place], [place], [place]  [address] Phone: [phone]; Fax: [phone]

## 2017-02-01 ENCOUNTER — Encounter: Payer: Self-pay | Admitting: Interventional Cardiology

## 2017-02-01 ENCOUNTER — Ambulatory Visit (INDEPENDENT_AMBULATORY_CARE_PROVIDER_SITE_OTHER): Payer: Medicaid - Out of State | Admitting: Interventional Cardiology

## 2017-02-01 VITALS — BP 114/78 | HR 66 | Ht 61.0 in | Wt 129.4 lb

## 2017-02-01 DIAGNOSIS — I25118 Atherosclerotic heart disease of native coronary artery with other forms of angina pectoris: Secondary | ICD-10-CM

## 2017-02-01 DIAGNOSIS — I5022 Chronic systolic (congestive) heart failure: Secondary | ICD-10-CM

## 2017-02-01 DIAGNOSIS — I10 Essential (primary) hypertension: Secondary | ICD-10-CM | POA: Diagnosis not present

## 2017-02-01 DIAGNOSIS — I340 Nonrheumatic mitral (valve) insufficiency: Secondary | ICD-10-CM

## 2017-02-01 DIAGNOSIS — I252 Old myocardial infarction: Secondary | ICD-10-CM

## 2017-02-01 DIAGNOSIS — Z9889 Other specified postprocedural states: Secondary | ICD-10-CM

## 2017-02-01 DIAGNOSIS — E785 Hyperlipidemia, unspecified: Secondary | ICD-10-CM

## 2017-02-01 NOTE — Patient Instructions (Signed)
Medication Instructions:  Your physician recommends that you continue on your current medications as directed. Please refer to the Current Medication list given to you today.   Labwork: None ordered  Testing/Procedures: None ordered  Follow-Up: Your physician recommends that you schedule a follow-up appointment in: 2-3 months with APP on Dr. Hoyle Barr team   Your physician wants you to follow-up in: June with Dr. Eldridge Dace. You will receive a reminder letter in the mail two months in advance. If you don't receive a letter, please call our office to schedule the follow-up appointment.   Any Other Special Instructions Will Be Listed Below (If Applicable).     If you need a refill on your cardiac medications before your next appointment, please call your pharmacy.

## 2017-02-06 ENCOUNTER — Ambulatory Visit (INDEPENDENT_AMBULATORY_CARE_PROVIDER_SITE_OTHER): Payer: Medicaid - Out of State | Admitting: *Deleted

## 2017-02-06 DIAGNOSIS — I428 Other cardiomyopathies: Secondary | ICD-10-CM | POA: Diagnosis not present

## 2017-02-06 DIAGNOSIS — Z95810 Presence of automatic (implantable) cardiac defibrillator: Secondary | ICD-10-CM | POA: Diagnosis not present

## 2017-02-06 DIAGNOSIS — I5022 Chronic systolic (congestive) heart failure: Secondary | ICD-10-CM | POA: Diagnosis not present

## 2017-02-06 LAB — CUP PACEART INCLINIC DEVICE CHECK
Battery Remaining Longevity: 70 mo
Brady Statistic AP VS Percent: 0.01 %
Brady Statistic AS VS Percent: 0.06 %
Brady Statistic RA Percent Paced: 0.04 %
Date Time Interrogation Session: 20190205155437
HIGH POWER IMPEDANCE MEASURED VALUE: 59 Ohm
Implantable Lead Implant Date: 20190124
Implantable Lead Location: 753859
Implantable Lead Location: 753860
Implantable Lead Model: 5076
Implantable Lead Model: 6935
Implantable Pulse Generator Implant Date: 20190124
Lead Channel Impedance Value: 209 Ohm
Lead Channel Impedance Value: 380 Ohm
Lead Channel Pacing Threshold Amplitude: 0.5 V
Lead Channel Pacing Threshold Amplitude: 0.75 V
Lead Channel Pacing Threshold Pulse Width: 0.4 ms
Lead Channel Sensing Intrinsic Amplitude: 12.375 mV
Lead Channel Setting Pacing Amplitude: 3.5 V
Lead Channel Setting Pacing Amplitude: 3.5 V
Lead Channel Setting Pacing Pulse Width: 0.4 ms
Lead Channel Setting Pacing Pulse Width: 0.5 ms
MDC IDC LEAD IMPLANT DT: 20190124
MDC IDC LEAD IMPLANT DT: 20190124
MDC IDC LEAD LOCATION: 753860
MDC IDC MSMT BATTERY VOLTAGE: 3.06 V
MDC IDC MSMT LEADCHNL LV IMPEDANCE VALUE: 342 Ohm
MDC IDC MSMT LEADCHNL LV IMPEDANCE VALUE: 494 Ohm
MDC IDC MSMT LEADCHNL LV PACING THRESHOLD PULSEWIDTH: 0.5 ms
MDC IDC MSMT LEADCHNL RA IMPEDANCE VALUE: 494 Ohm
MDC IDC MSMT LEADCHNL RA PACING THRESHOLD PULSEWIDTH: 0.4 ms
MDC IDC MSMT LEADCHNL RA SENSING INTR AMPL: 2 mV
MDC IDC MSMT LEADCHNL RV IMPEDANCE VALUE: 456 Ohm
MDC IDC MSMT LEADCHNL RV PACING THRESHOLD AMPLITUDE: 1 V
MDC IDC SET LEADCHNL RA PACING AMPLITUDE: 3.5 V
MDC IDC SET LEADCHNL RV SENSING SENSITIVITY: 0.3 mV
MDC IDC STAT BRADY AP VP PERCENT: 0.03 %
MDC IDC STAT BRADY AS VP PERCENT: 99.91 %
MDC IDC STAT BRADY RV PERCENT PACED: 99.91 %

## 2017-02-06 NOTE — Progress Notes (Signed)
Wound check appointment. Steri-strips removed. Wound without redness. Moderate hematoma noted, firm to palpation. Incision edges approximated, wound healing well. GT assessed site, recommended that patient hold Plavix but continue ASA until wound recheck while GT in the office on 02/14/17.   Normal device function. Thresholds, sensing, and impedances consistent with implant measurements. Device programmed at 3.5V for extra safety margin until 3 month visit. LV (His) capture appears septal until LOC, PW reprogrammed to 0.36ms per GT. Histogram distribution appropriate for patient and level of activity. No mode switches or ventricular arrhythmias noted. Patient educated about wound care, arm mobility, lifting restrictions, shock plan, and Carelink monitor. Wound recheck on 02/14/17 and ROV with GT on 04/27/17.

## 2017-02-06 NOTE — Patient Instructions (Signed)
Medication Instructions:   -- HOLD (do not take) Plavix until your appointment next week.  We will give you further instructions at this appointment.  -- CONTINUE your 81mg  aspirin once daily  Labwork: N/A  Testing/Procedures: N/A  Follow-Up:  Appointment with the Device [facility] on 02/14/17 at 10:30am   If you need a refill on your cardiac medications before your next appointment, please call your pharmacy.

## 2017-02-14 ENCOUNTER — Ambulatory Visit (INDEPENDENT_AMBULATORY_CARE_PROVIDER_SITE_OTHER): Payer: Self-pay | Admitting: *Deleted

## 2017-02-14 DIAGNOSIS — I428 Other cardiomyopathies: Secondary | ICD-10-CM

## 2017-02-14 NOTE — Progress Notes (Signed)
Wound re-check for 2/5 hematoma. Patient presented today with continued swelling around device site, notably soft to palpation. GT assessed and ordered Plavix to be re-started. ROV with GT 4/26.

## 2017-02-23 ENCOUNTER — Telehealth: Payer: Self-pay

## 2017-02-23 NOTE — Telephone Encounter (Signed)
Called and left message for patient to call back regarding her disability paperwork.

## 2017-02-23 NOTE — Telephone Encounter (Signed)
Patient returning call. Made patient aware that I have her disability forms that have been completed by Dr. Eldridge Dace and Dr. Ladona Ridgel. Patient requesting that the forms be mailed to her. Address verified. Made patient aware that it will take 7-10 business days to arrive to her. Patient verbalized understanding and thanked me for the call.

## 2017-03-02 ENCOUNTER — Other Ambulatory Visit: Payer: Self-pay | Admitting: Internal Medicine

## 2017-03-29 ENCOUNTER — Encounter (INDEPENDENT_AMBULATORY_CARE_PROVIDER_SITE_OTHER): Payer: Self-pay

## 2017-03-29 ENCOUNTER — Encounter: Payer: Self-pay | Admitting: Cardiology

## 2017-03-29 ENCOUNTER — Ambulatory Visit (INDEPENDENT_AMBULATORY_CARE_PROVIDER_SITE_OTHER): Payer: Medicaid - Out of State | Admitting: Cardiology

## 2017-03-29 VITALS — BP 104/70 | HR 84 | Ht 61.0 in | Wt 137.8 lb

## 2017-03-29 DIAGNOSIS — Z79899 Other long term (current) drug therapy: Secondary | ICD-10-CM | POA: Diagnosis not present

## 2017-03-29 DIAGNOSIS — E785 Hyperlipidemia, unspecified: Secondary | ICD-10-CM | POA: Diagnosis not present

## 2017-03-29 MED ORDER — EZETIMIBE 10 MG PO TABS: 10.0000 mg | ORAL_TABLET | Freq: Every day | ORAL | 3 refills | Status: DC

## 2017-03-29 NOTE — Patient Instructions (Signed)
Medication Instructions:  Your physician has recommended you make the following change in your medication:  1-START Zetia 10 mg by mouth daily.  Lab work: Your physician recommends that you return for lab work in: 6 weeks for fasting lipid and liver panel.  Testing/Procedures: NONE  Follow-Up: Your physician wants you to follow-up as already scheduled with Dr. Eldridge Dace.   If you need a refill on your cardiac medications before your next appointment, please call your pharmacy.

## 2017-03-29 NOTE — Progress Notes (Signed)
03/29/2017 Lindsay Camacho   08/31/1965  [id number]  Primary Physician Montez Hageman, DO Primary Cardiologist: Dr. Eldridge Dace   Reason for Visit/CC: F/u for chronic systolic heart failure  HPI:  52 y.o.female, well known to me, and followed primarily by Dr. Gar Gibbon ah/o CAD s/plateral wall MI in 2015treated with PCI to the CXF. She had a repeat cath in 09/2014 showing patent stent. She unfortunately had a RP bleed post cath and required vascular surgery. She had another R/LHC prior to valve surgery 08/21/16 that showed patent CFX stent with minal restenosis + mild nonobstructive CAD in the RCA and LAD. She also has h/o chronic systolic HF wpreviousEF of 35-40%, severe MR and tobacco abuse.S/p minimally invasive MV Repair 08/25/16 by Dr. Cornelius Moras.   She was recently readmitted to [facility] in January 2019 for worsening systolic heart failure.  The patient was transferred from [facility] with decreased EF down to 20-25%.  She was diuresed with IV Lasix but again medical therapy for her systolic heart failure was limited due to hypotension.  Subsequently, EP was consulted for ICD implantation.  She underwent ICD implant by Dr. Ladona Ridgel on 01/24/2017 but unsuccessful LV leaddue to anatomy. She has HIS and RV lead.  She was seen by Dr. Eldridge Dace for post hospital follow-up on February 01, 2017 and was doing well from a cardiac standpoint.  She is back again today for 77-month follow-up.  She reports that she has continued to do well from a cardiac standpoint.  She denies chest pain.  No dyspnea.  She reports that she feels the best that she has felt in several years after getting her ICD implanted.  She reports full medication compliance.  She only takes Lasix as needed.  She has only had to do this 1-2 times since her discharge in January.  Blood pressure is 104/70.  No symptoms of dizziness or near syncope.  Cardiac Studies 2D Echo 01/22/17 Study Conclusions  - Left ventricle:  Abnormal septal motion. The cavity size was   moderately dilated. Wall thickness was increased in a pattern of   mild LVH. The estimated ejection fraction was 25%. Diffuse   hypokinesis. - Aortic valve: There was mild regurgitation. - Mitral valve: Post repair with ring no signficiant MR and stable   low mean gradient Valve area by continuity equation (using LVOT   flow): 0.82 cm^2. - Atrial septum: No defect or patent foramen ovale was identified. - Tricuspid valve: There was moderate regurgitation.  Current Meds  Medication Sig  . acetaminophen (TYLENOL) 500 MG tablet Take 1,000 mg by mouth every 6 (six) hours as needed for headache (pain).  Marland Kitchen aspirin EC 81 MG tablet Take 81 mg by mouth daily.  Marland Kitchen atorvastatin (LIPITOR) 80 MG tablet Take 1 tablet (80 mg total) by mouth daily. (Patient taking differently: Take 80 mg by mouth at bedtime. )  . carvedilol (COREG) 6.25 MG tablet Take 6.25 mg by mouth 2 (two) times daily with a meal.  . clopidogrel (PLAVIX) 75 MG tablet Take 1 tablet (75 mg total) by mouth daily with breakfast.  . furosemide (LASIX) 40 MG tablet Take 40 mg by mouth daily as needed for fluid or edema (shortness of breath).    Allergies  Allergen Reactions  . Aldomet [Methyldopa] Other (See Comments)    Severe hypotension  . Brilinta [Ticagrelor] Shortness Of Breath  . Other Other (See Comments)    Nuclear Stress Test Medication caused seizures  . Coumadin [Warfarin Sodium]  Swelling    Arm swelled  . Penicillins Rash    Has patient had a PCN reaction causing immediate rash, facial/tongue/throat swelling, SOB or lightheadedness with hypotension: Yes Has patient had a PCN reaction causing severe rash involving mucus membranes or skin necrosis: No Has patient had a PCN reaction that required hospitalization: No Has patient had a PCN reaction occurring within the last 10 years: No If all of the above answers are "NO", then may proceed with Cephalosporin use.    Past Medical  History:  Diagnosis Date  . Acute myocardial infarction of other lateral wall, initial episode of care 01/2013   DES CFX  . Aortoiliac occlusive disease (HCC) 08/22/2016  . CAD (coronary artery disease) 11/13/2011   50% ? proximal LCx stenosis per Cath at [facility]  . Chest pain, atypical, muscular skelatal   01/09/2014  . Dyspnea   . Heart failure (HCC)   . HLD (hyperlipidemia)   . Hypertension   . Intermediate coronary syndrome (HCC)   . LBBB (left bundle branch block)   . Mitral regurgitation   . Nonischemic cardiomyopathy (HCC) 01/26/2017  . PVD (peripheral vascular disease) (HCC)    Right CFA stenosis per report on 11/14/2011  . 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/25/2016   Edwards McArthy-Adams IMR ETLogix ring annuloplasty (Model 4100, Serial [id number], size 26) placed via right mini thoracotomy approach  . Stenosis of left subclavian artery (HCC) 08/22/2016  . Stroke [facility])    tia with no deficits   Family History  Problem Relation Age of Onset  . CAD Mother 47  . Lung cancer Mother   . Bladder Cancer Mother   . Stroke Mother   . Heart disease Mother        Before age 37  . Hypertension Mother   . Heart attack Mother   . CAD Father 43  . Heart disease Father        Before age 23  . Heart attack Father   . Stroke Father        Bleeding stroke   Past Surgical History:  Procedure Laterality Date  . APPENDECTOMY    . BIV ICD INSERTION CRT-D N/A 01/24/2017   Procedure: BIV ICD INSERTION CRT-D;  Surgeon: Marinus Maw, MD;  Location: [facility] INVASIVE CV LAB;  Service: Cardiovascular;  Laterality: N/A;  . CARDIAC CATHETERIZATION  04/14/2013   Non-obstructive disease, patent CFX stent  . CARDIAC CATHETERIZATION N/A 09/21/2014   Procedure: Left Heart Cath and Coronary Angiography;  Surgeon: Lennette Bihari, MD;  Location: [facility] INVASIVE CV LAB;  Service: Cardiovascular;  Laterality: N/A;  . CORONARY ANGIOPLASTY WITH STENT PLACEMENT  01/17/2013   mild disease except 99% CFX, rx  with  2.5 x 28 Alpine drug-eluting stent   . GROIN DEBRIDEMENT Right 04/14/2013   Procedure: Emergency Evacuation of Retroperitoneal Hematoma and Repair Right External Iliac Artery Pseudoaneurysm    ;  Surgeon: Sherren Kerns, MD;  Location: [facility] OR;  Service: Vascular;  Laterality: Right;  . LEFT HEART CATHETERIZATION WITH CORONARY ANGIOGRAM N/A 01/18/2013   Procedure: LEFT HEART CATHETERIZATION WITH CORONARY ANGIOGRAM;  Surgeon: Corky Crafts, MD;  Location: [facility] CATH LAB;  Service: Cardiovascular;  Laterality: N/A;  . LEFT HEART CATHETERIZATION WITH CORONARY ANGIOGRAM N/A 04/14/2013   Procedure: LEFT HEART CATHETERIZATION WITH CORONARY ANGIOGRAM;  Surgeon: Corky Crafts, MD;  Location: [facility] CATH LAB;  Service: Cardiovascular;  Laterality: N/A;  . MITRAL VALVE REPAIR Right 08/25/2016   Procedure: MINIMALLY INVASIVE MITRAL  VALVE REPAIR;  Surgeon: Purcell Nails, MD;  Location: [facility] OR;  Service: Open Heart Surgery;  Laterality: Right;  . MULTIPLE EXTRACTIONS WITH ALVEOLOPLASTY N/A 08/23/2016   Procedure: Extraction of tooth #'s 17, 20-23, 26-29 with alveoloplasty;  Surgeon: [name], DDS;  Location: [facility] OR;  Service: Oral Surgery;  Laterality: N/A;  . PERCUTANEOUS CORONARY STENT INTERVENTION (PCI-S)  01/18/2013   Procedure: PERCUTANEOUS CORONARY STENT INTERVENTION (PCI-S);  Surgeon: Corky Crafts, MD;  Location: [facility] CATH LAB;  Service: Cardiovascular;;  . RIGHT/LEFT HEART CATH AND CORONARY ANGIOGRAPHY N/A 08/21/2016   Procedure: RIGHT/LEFT HEART CATH AND CORONARY ANGIOGRAPHY;  Surgeon: Kathleene Hazel, MD;  Location: [facility] INVASIVE CV LAB;  Service: Cardiovascular;  Laterality: N/A;  . TEE WITHOUT CARDIOVERSION N/A 03/06/2014   Procedure: TRANSESOPHAGEAL ECHOCARDIOGRAM (TEE);  Surgeon: Lars Masson, MD;  Location: [facility] ENDOSCOPY;  Service: Cardiovascular;  Laterality: N/A;  . TEE WITHOUT CARDIOVERSION N/A 08/18/2016   Procedure: TRANSESOPHAGEAL ECHOCARDIOGRAM (TEE);  Surgeon:  Lewayne Bunting, MD;  Location: [facility] ENDOSCOPY;  Service: Cardiovascular;  Laterality: N/A;  . TEE WITHOUT CARDIOVERSION N/A 08/25/2016   Procedure: TRANSESOPHAGEAL ECHOCARDIOGRAM (TEE);  Surgeon: Purcell Nails, MD;  Location: [facility] OR;  Service: Open Heart Surgery;  Laterality: N/A;  . TUBAL LIGATION     Social History   Socioeconomic History  . Marital status: Single    Spouse name: Not on file  . Number of children: Not on file  . Years of education: Not on file  . Highest education level: Not on file  Occupational History  . Not on file  Social Needs  . Financial resource strain: Not on file  . Food insecurity:    Worry: Not on file    Inability: Not on file  . Transportation needs:    Medical: Not on file    Non-medical: Not on file  Tobacco Use  . Smoking status: Former Smoker    Packs/day: 0.50    Types: Cigarettes    Last attempt to quit: 08/19/2015    Years since quitting: 1.6  . Smokeless tobacco: Never Used  . Tobacco comment: i QUIT SMOKING A YEAR AGO, i DONT REMEMBER WHAT MONTH  Substance and Sexual Activity  . Alcohol use: No    Alcohol/week: 0.0 oz  . Drug use: Yes    Types: Marijuana  . Sexual activity: Yes    Birth control/protection: None  Lifestyle  . Physical activity:    Days per week: Not on file    Minutes per session: Not on file  . Stress: Not on file  Relationships  . Social connections:    Talks on phone: Not on file    Gets together: Not on file    Attends religious service: Not on file    Active member of club or organization: Not on file    Attends meetings of clubs or organizations: Not on file    Relationship status: Not on file  . Intimate partner violence:    Fear of current or ex partner: Not on file    Emotionally abused: Not on file    Physically abused: Not on file    Forced sexual activity: Not on file  Other Topics Concern  . Not on file  Social History Narrative  . Not on file     Review of Systems: General:  negative for chills, fever, night sweats or weight changes.  Cardiovascular: negative for chest pain, dyspnea on exertion, edema, orthopnea, palpitations, paroxysmal nocturnal dyspnea  or shortness of breath Dermatological: negative for rash Respiratory: negative for cough or wheezing Urologic: negative for hematuria Abdominal: negative for nausea, vomiting, diarrhea, bright red blood per rectum, melena, or hematemesis Neurologic: negative for visual changes, syncope, or dizziness All other systems reviewed and are otherwise negative except as noted above.   Physical Exam:  Blood pressure 104/70, pulse 84, height 5\' 1"  (1.549 m), weight 137 lb 12.8 oz (62.5 kg), SpO2 98 %.  General appearance: alert, cooperative and no distress Neck: no carotid bruit and no JVD Lungs: clear to auscultation bilaterally Heart: regular rate and rhythm, S1, S2 normal, no murmur, click, rub or gallop Extremities: extremities normal, atraumatic, no cyanosis or edema Pulses: 2+ and symmetric Skin: Skin color, texture, turgor normal. No rashes or lesions Neurologic: Grossly normal  EKG not performed -- personally reviewed   ASSESSMENT AND PLAN:   1. Chronic systolic heart failure: Stable.  She is euvolemic on physical exam.  She denies any dyspnea.  No orthopnea or PND.  She now has an ICD.  Guidelines directed medical therapy has been limited due to soft blood pressures/prior history of hypotension.  She has been able to tolerate low-dose beta-blocker therapy with carvedilol and we will continue this.  In the past, she has not been unable to tolerate ACE ARB/Entresto due to hypotension.  She takes Lasix only as needed.  We discussed the importance of checking weight daily and avoidance of sodium.  2.  CAD: Stable without chest pain.  Continue medical therapy with aspirin, statin and beta-blocker.  3. H/o Severe Mitral Valve Regurgitation: Status post mitral valve replacement by Dr. Cornelius Moras in 2018.  He continues to  follow this.  She denies any chest pain or dyspnea.  4.  ICD: Recently implanted January 2019 for chronic systolic heart failure.  Medtronic device, followed by Dr. Ladona Ridgel.  The patient reports she has felt significantly better post device implantation. She has f/u with Dr. Ladona Ridgel next month.  5. HLD: LDL elevated at 102 mg/dL.  She reports full compliance with Lipitor and is on max dose of 80 mg nightly.  The patient has known coronary artery disease and needs additional LDL reduction.  Recent comprehensive metabolic panel showed normal hepatic function.  We will add Zetia, 10 mg to regimen and will recheck fasting lipid panel and hepatic function test in 8 weeks.  LDL Goal is less than 70 mg/dL.   Follow-Up: Keep f/u with Dr. Eldridge Dace in June.   Yamilka Lopiccolo Delmer Islam, MHS [facility] 03/29/2017 2:36 PM

## 2017-04-27 ENCOUNTER — Ambulatory Visit (INDEPENDENT_AMBULATORY_CARE_PROVIDER_SITE_OTHER): Payer: Medicaid - Out of State | Admitting: Internal Medicine

## 2017-04-27 ENCOUNTER — Encounter: Payer: Self-pay | Admitting: Internal Medicine

## 2017-04-27 VITALS — BP 118/72 | HR 89 | Ht 61.0 in | Wt 132.0 lb

## 2017-04-27 DIAGNOSIS — I5022 Chronic systolic (congestive) heart failure: Secondary | ICD-10-CM | POA: Diagnosis not present

## 2017-04-27 DIAGNOSIS — I428 Other cardiomyopathies: Secondary | ICD-10-CM

## 2017-04-27 DIAGNOSIS — Z95810 Presence of automatic (implantable) cardiac defibrillator: Secondary | ICD-10-CM

## 2017-04-27 LAB — CUP PACEART INCLINIC DEVICE CHECK
Battery Remaining Longevity: 81 mo
Battery Voltage: 3.01 V
Brady Statistic AP VP Percent: 0.03 %
Brady Statistic AS VP Percent: 99.87 %
Brady Statistic RV Percent Paced: 99.82 %
Date Time Interrogation Session: 20190426143027
HighPow Impedance: 72 Ohm
Implantable Lead Implant Date: 20190124
Implantable Lead Location: 753860
Implantable Lead Model: 3830
Implantable Lead Model: 5076
Implantable Lead Model: 6935
Implantable Pulse Generator Implant Date: 20190124
Lead Channel Impedance Value: 323 Ohm
Lead Channel Impedance Value: 494 Ohm
Lead Channel Impedance Value: 570 Ohm
Lead Channel Pacing Threshold Amplitude: 0.625 V
Lead Channel Pacing Threshold Amplitude: 0.875 V
Lead Channel Sensing Intrinsic Amplitude: 15.25 mV
Lead Channel Sensing Intrinsic Amplitude: 2.125 mV
Lead Channel Sensing Intrinsic Amplitude: 2.5 mV
Lead Channel Setting Pacing Amplitude: 1.25 V
Lead Channel Setting Pacing Amplitude: 3.25 V
Lead Channel Setting Pacing Pulse Width: 0.4 ms
Lead Channel Setting Sensing Sensitivity: 0.3 mV
MDC IDC LEAD IMPLANT DT: 20190124
MDC IDC LEAD IMPLANT DT: 20190124
MDC IDC LEAD LOCATION: 753859
MDC IDC LEAD LOCATION: 753860
MDC IDC MSMT LEADCHNL LV IMPEDANCE VALUE: 209 Ohm
MDC IDC MSMT LEADCHNL LV PACING THRESHOLD PULSEWIDTH: 0.5 ms
MDC IDC MSMT LEADCHNL RA IMPEDANCE VALUE: 456 Ohm
MDC IDC MSMT LEADCHNL RA PACING THRESHOLD PULSEWIDTH: 0.4 ms
MDC IDC MSMT LEADCHNL RV IMPEDANCE VALUE: 399 Ohm
MDC IDC MSMT LEADCHNL RV PACING THRESHOLD AMPLITUDE: 0.875 V
MDC IDC MSMT LEADCHNL RV PACING THRESHOLD PULSEWIDTH: 0.4 ms
MDC IDC MSMT LEADCHNL RV SENSING INTR AMPL: 15.375 mV
MDC IDC SET LEADCHNL LV PACING PULSEWIDTH: 0.5 ms
MDC IDC SET LEADCHNL RV PACING AMPLITUDE: 3.25 V
MDC IDC STAT BRADY AP VS PERCENT: 0.01 %
MDC IDC STAT BRADY AS VS PERCENT: 0.09 %
MDC IDC STAT BRADY RA PERCENT PACED: 0.04 %

## 2017-04-27 NOTE — Progress Notes (Signed)
HPI Mrs. Andazola returns today for ongoing evaluation and management of her ICD, in the setting of chronic systolic heart failure, s/p MV repair, and LBBB. She underwent Biv ICD insertion in January. Since then, she has done well. She thinks her breathing and energy are improved. No syncope. No ICD shocks. No edema. Allergies  Allergen Reactions  . Aldomet [Methyldopa] Other (See Comments)    Severe hypotension  . Brilinta [Ticagrelor] Shortness Of Breath  . Other Other (See Comments)    Nuclear Stress Test Medication caused seizures  . Coumadin [Warfarin Sodium] Swelling    Arm swelled  . Penicillins Rash    Has patient had a PCN reaction causing immediate rash, facial/tongue/throat swelling, SOB or lightheadedness with hypotension: Yes Has patient had a PCN reaction causing severe rash involving mucus membranes or skin necrosis: No Has patient had a PCN reaction that required hospitalization: No Has patient had a PCN reaction occurring within the last 10 years: No If all of the above answers are "NO", then may proceed with Cephalosporin use.      Current Outpatient Medications  Medication Sig Dispense Refill  . acetaminophen (TYLENOL) 500 MG tablet Take 1,000 mg by mouth every 6 (six) hours as needed for headache (pain).    Marland Kitchen aspirin EC 81 MG tablet Take 81 mg by mouth daily.    Marland Kitchen atorvastatin (LIPITOR) 80 MG tablet Take 1 tablet (80 mg total) by mouth daily. (Patient taking differently: Take 80 mg by mouth at bedtime. ) 30 tablet 11  . carvedilol (COREG) 6.25 MG tablet Take 6.25 mg by mouth 2 (two) times daily with a meal.    . clopidogrel (PLAVIX) 75 MG tablet Take 1 tablet (75 mg total) by mouth daily with breakfast. 30 tablet 11  . ezetimibe (ZETIA) 10 MG tablet Take 1 tablet (10 mg total) by mouth daily. 90 tablet 3  . furosemide (LASIX) 40 MG tablet Take 40 mg by mouth daily as needed for fluid or edema (shortness of breath).      No current facility-administered  medications for this visit.      Past Medical History:  Diagnosis Date  . Acute myocardial infarction of other lateral wall, initial episode of care 01/2013   DES CFX  . Aortoiliac occlusive disease (HCC) 08/22/2016  . CAD (coronary artery disease) 11/13/2011   50% ? proximal LCx stenosis per Cath at [facility]  . Chest pain, atypical, muscular skelatal   01/09/2014  . Dyspnea   . Heart failure (HCC)   . HLD (hyperlipidemia)   . Hypertension   . Intermediate coronary syndrome (HCC)   . LBBB (left bundle branch block)   . Mitral regurgitation   . Nonischemic cardiomyopathy (HCC) 01/26/2017  . PVD (peripheral vascular disease) (HCC)    Right CFA stenosis per report on 11/14/2011  . 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/25/2016   Edwards McArthy-Adams IMR ETLogix ring annuloplasty (Model 4100, Serial [id number], size 26) placed via right mini thoracotomy approach  . Stenosis of left subclavian artery (HCC) 08/22/2016  . Stroke [facility])    tia with no deficits    ROS:   All systems reviewed and negative except as noted in the HPI.   Past Surgical History:  Procedure Laterality Date  . APPENDECTOMY    . BIV ICD INSERTION CRT-D N/A 01/24/2017   Procedure: BIV ICD INSERTION CRT-D;  Surgeon: Marinus Maw, MD;  Location: [facility] INVASIVE CV LAB;  Service: Cardiovascular;  Laterality: N/A;  .  CARDIAC CATHETERIZATION  04/14/2013   Non-obstructive disease, patent CFX stent  . CARDIAC CATHETERIZATION N/A 09/21/2014   Procedure: Left Heart Cath and Coronary Angiography;  Surgeon: Lennette Bihari, MD;  Location: [facility] INVASIVE CV LAB;  Service: Cardiovascular;  Laterality: N/A;  . CORONARY ANGIOPLASTY WITH STENT PLACEMENT  01/17/2013   mild disease except 99% CFX, rx with  2.5 x 28 Alpine drug-eluting stent   . GROIN DEBRIDEMENT Right 04/14/2013   Procedure: Emergency Evacuation of Retroperitoneal Hematoma and Repair Right External Iliac Artery Pseudoaneurysm    ;  Surgeon: Sherren Kerns, MD;   Location: [facility] OR;  Service: Vascular;  Laterality: Right;  . LEFT HEART CATHETERIZATION WITH CORONARY ANGIOGRAM N/A 01/18/2013   Procedure: LEFT HEART CATHETERIZATION WITH CORONARY ANGIOGRAM;  Surgeon: Corky Crafts, MD;  Location: [facility] CATH LAB;  Service: Cardiovascular;  Laterality: N/A;  . LEFT HEART CATHETERIZATION WITH CORONARY ANGIOGRAM N/A 04/14/2013   Procedure: LEFT HEART CATHETERIZATION WITH CORONARY ANGIOGRAM;  Surgeon: Corky Crafts, MD;  Location: [facility] CATH LAB;  Service: Cardiovascular;  Laterality: N/A;  . MITRAL VALVE REPAIR Right 08/25/2016   Procedure: MINIMALLY INVASIVE MITRAL VALVE REPAIR;  Surgeon: Purcell Nails, MD;  Location: [facility] OR;  Service: Open Heart Surgery;  Laterality: Right;  . MULTIPLE EXTRACTIONS WITH ALVEOLOPLASTY N/A 08/23/2016   Procedure: Extraction of tooth #'s 17, 20-23, 26-29 with alveoloplasty;  Surgeon: [name], DDS;  Location: [facility] OR;  Service: Oral Surgery;  Laterality: N/A;  . PERCUTANEOUS CORONARY STENT INTERVENTION (PCI-S)  01/18/2013   Procedure: PERCUTANEOUS CORONARY STENT INTERVENTION (PCI-S);  Surgeon: Corky Crafts, MD;  Location: [facility] CATH LAB;  Service: Cardiovascular;;  . RIGHT/LEFT HEART CATH AND CORONARY ANGIOGRAPHY N/A 08/21/2016   Procedure: RIGHT/LEFT HEART CATH AND CORONARY ANGIOGRAPHY;  Surgeon: Kathleene Hazel, MD;  Location: [facility] INVASIVE CV LAB;  Service: Cardiovascular;  Laterality: N/A;  . TEE WITHOUT CARDIOVERSION N/A 03/06/2014   Procedure: TRANSESOPHAGEAL ECHOCARDIOGRAM (TEE);  Surgeon: Lars Masson, MD;  Location: [facility] ENDOSCOPY;  Service: Cardiovascular;  Laterality: N/A;  . TEE WITHOUT CARDIOVERSION N/A 08/18/2016   Procedure: TRANSESOPHAGEAL ECHOCARDIOGRAM (TEE);  Surgeon: Lewayne Bunting, MD;  Location: [facility] ENDOSCOPY;  Service: Cardiovascular;  Laterality: N/A;  . TEE WITHOUT CARDIOVERSION N/A 08/25/2016   Procedure: TRANSESOPHAGEAL ECHOCARDIOGRAM (TEE);  Surgeon: Purcell Nails, MD;  Location: [facility]  OR;  Service: Open Heart Surgery;  Laterality: N/A;  . TUBAL LIGATION       Family History  Problem Relation Age of Onset  . CAD Mother 77  . Lung cancer Mother   . Bladder Cancer Mother   . Stroke Mother   . Heart disease Mother        Before age 40  . Hypertension Mother   . Heart attack Mother   . CAD Father 52  . Heart disease Father        Before age 28  . Heart attack Father   . Stroke Father        Bleeding stroke     Social History   Socioeconomic History  . Marital status: Single    Spouse name: Not on file  . Number of children: Not on file  . Years of education: Not on file  . Highest education level: Not on file  Occupational History  . Not on file  Social Needs  . Financial resource strain: Not on file  . Food insecurity:    Worry: Not on file    Inability: Not on  file  . Transportation needs:    Medical: Not on file    Non-medical: Not on file  Tobacco Use  . Smoking status: Former Smoker    Packs/day: 0.50    Types: Cigarettes    Last attempt to quit: 08/19/2015    Years since quitting: 1.6  . Smokeless tobacco: Never Used  . Tobacco comment: i QUIT SMOKING A YEAR AGO, i DONT REMEMBER WHAT MONTH  Substance and Sexual Activity  . Alcohol use: No    Alcohol/week: 0.0 oz  . Drug use: Yes    Types: Marijuana  . Sexual activity: Yes    Birth control/protection: None  Lifestyle  . Physical activity:    Days per week: Not on file    Minutes per session: Not on file  . Stress: Not on file  Relationships  . Social connections:    Talks on phone: Not on file    Gets together: Not on file    Attends religious service: Not on file    Active member of club or organization: Not on file    Attends meetings of clubs or organizations: Not on file    Relationship status: Not on file  . Intimate partner violence:    Fear of current or ex partner: Not on file    Emotionally abused: Not on file    Physically abused: Not on file    Forced sexual  activity: Not on file  Other Topics Concern  . Not on file  Social History Narrative  . Not on file     BP 118/72   Pulse 89   Ht 5\' 1"  (1.549 m)   Wt 132 lb (59.9 kg)   BMI 24.94 kg/m   Physical Exam:  Well appearing 52 yo woman, NAD HEENT: Unremarkable Neck:  6 cm JVD, no thyromegally Lymphatics:  No adenopathy Back:  No CVA tenderness Lungs:  Clear with no wheezes HEART:  Regular rate rhythm, no murmurs, no rubs, no clicks Abd:  soft, positive bowel sounds, no organomegally, no rebound, no guarding Ext:  2 plus pulses, no edema, no cyanosis, no clubbing Skin:  No rashes no nodules Neuro:  CN II through XII intact, motor grossly intact  EKG - NSR with biV pacing  DEVICE  Normal device function.  See PaceArt for details. Her LV lead is in the His bundle and the threshold is good. Her QRS intrinsic is 154 and with biv pacing 144.    Assess/Plan: 1. Chronic systolic heart failure - her symptoms are improved after her device was placed. I have asked her to maintain a low sodium diet and to take her meds as prescribed. 2. ICD - her Medtronic device is working normally. With her coronary venous anatomy, if her CHF symptoms worsened then she would need epicardial lead insertion. 3. MV repair - on exam she does not have any obvious MR. Will follow.   Lindsay Camacho.D.

## 2017-04-27 NOTE — Patient Instructions (Signed)
Medication Instructions:  Your physician recommends that you continue on your current medications as directed. Please refer to the Current Medication list given to you today.  Labwork: None ordered.  Testing/Procedures: None ordered.  Follow-Up: Your physician wants you to follow-up in: 9 months with Dr. Taylor.   You will receive a reminder letter in the mail two months in advance. If you don't receive a letter, please call our office to schedule the follow-up appointment.  Remote monitoring is used to monitor your ICD from home. This monitoring reduces the number of office visits required to check your device to one time per year. It allows us to keep an eye on the functioning of your device to ensure it is working properly. You are scheduled for a device check from home on 07/30/2017. You may send your transmission at any time that day. If you have a wireless device, the transmission will be sent automatically. After your physician reviews your transmission, you will receive a postcard with your next transmission date.  Any Other Special Instructions Will Be Listed Below (If Applicable).  If you need a refill on your cardiac medications before your next appointment, please call your pharmacy.   

## 2017-05-09 ENCOUNTER — Telehealth: Payer: Self-pay | Admitting: Interventional Cardiology

## 2017-05-09 NOTE — Telephone Encounter (Signed)
Called and made patient aware that according to our records (noted in OV from 10/27/15), she  "had an admission in the [place], [place] hospital on 10/12/15. She presented with signs and symptoms of congestive heart failure. She had a 2-D echocardiogram which revealed reduction in her EF down to 40-45%. She was started on a beta blocker and ACE inhibitor. She was also instructed to start taking Lasix daily." Made patient aware that at the OV from 10/27/15 we were made aware that she was taking carvedilol 6.25 mg BID. Patient verbalized understanding and thanked me for the call.

## 2017-05-09 NOTE — Telephone Encounter (Signed)
°  Pt c/o medication issue:  1. Name of Medication:  carvedilol (COREG) 6.25 MG tablet    2. How are you currently taking this medication (dosage and times per day)? Take 6.25 mg by mouth 2 (two) times daily with a meal.  3. Are you having a reaction (difficulty breathing--STAT)? no 4. What is your medication issue? Pt want to know the exact date that she started taking the medication

## 2017-05-10 ENCOUNTER — Other Ambulatory Visit: Payer: Medicaid - Out of State

## 2017-05-10 DIAGNOSIS — E785 Hyperlipidemia, unspecified: Secondary | ICD-10-CM

## 2017-05-10 DIAGNOSIS — Z79899 Other long term (current) drug therapy: Secondary | ICD-10-CM

## 2017-05-10 LAB — HEPATIC FUNCTION PANEL
ALK PHOS: 87 IU/L (ref 39–117)
ALT: 12 IU/L (ref 0–32)
AST: 15 IU/L (ref 0–40)
Albumin: 3.7 g/dL (ref 3.5–5.5)
Bilirubin Total: 0.4 mg/dL (ref 0.0–1.2)
Bilirubin, Direct: 0.12 mg/dL (ref 0.00–0.40)
Total Protein: 6.3 g/dL (ref 6.0–8.5)

## 2017-05-10 LAB — LIPID PANEL
Chol/HDL Ratio: 4.8 ratio — ABNORMAL HIGH (ref 0.0–4.4)
Cholesterol, Total: 139 mg/dL (ref 100–199)
HDL: 29 mg/dL — AB (ref >39)
LDL Calculated: 81 mg/dL (ref 0–99)
Triglycerides: 144 mg/dL (ref 0–149)
VLDL Cholesterol Cal: 29 mg/dL (ref 5–40)

## 2017-05-11 ENCOUNTER — Telehealth: Payer: Self-pay

## 2017-05-11 DIAGNOSIS — E785 Hyperlipidemia, unspecified: Secondary | ICD-10-CM

## 2017-05-11 NOTE — Telephone Encounter (Signed)
-----   Message from [place] sent at 05/10/2017  4:27 PM EDT ----- LDL is improved but still above recommended goal. We would like for level to be < 70. She is now at 81, down from 102. Make sure pt it taking Lipitor 80 mg and Zetia 10 mg every day. If not, improve compliance to daily. If she has been fully compliant then we should refer to lipid clinic for PCSK9 inhibitor therapy.

## 2017-05-11 NOTE — Telephone Encounter (Signed)
Pt is aware and agreeable to LDL not being at target. Pt states she is being fully compliant with taking Atorvastatin and Zetia daily as prescribed. Pt is agreeable to lipid clinic referral.

## 2017-05-21 ENCOUNTER — Ambulatory Visit: Payer: Self-pay

## 2017-05-31 ENCOUNTER — Telehealth: Payer: Self-pay | Admitting: Interventional Cardiology

## 2017-05-31 NOTE — Telephone Encounter (Signed)
Returned call to patient who states that she saw her PCP Dr. Mayford Knife at [facility] in [place], [place] and he drew labs and told her that her CHF has worsened and that she needed to follow up with her heart doctor. Patient states that he did not run any tests other than blood work. Patient states that she has slight SOB when she walks up stairs or walks for long distances. She states that it is not that bad and that she feels better since she had her BIV ICD placed in January. Patient denies any LEE and states that she has lost weight. Patient states that she has prn lasix and hardly has to use it. She states that the last time that she used it was 1 week ago. Patient states that her PCP said that her BP was elevated and started her on olmesartan 20 mg QD. Patient saw Dr. Ladona Ridgel last on 4/26 and was doing well. In his OV note he states that if her CHF symptoms worsened then she would need epicardial lead insertion. Patient states that she feels fine. Made patient aware that I would request OV notes and lab results from Dr. Norris Cross office before making any changes at this time. Patient already scheduled for f/u with JV on 6/27. Instructed patient to let us know if her S/Sx changed. Patient verbalized understanding and thanked me for the call.   Called Dr. Norris Cross office at [facility] at [phone] and requested records. They are requesting a fax be sent to [phone] requesting records. Fax sent. Will wait for records.

## 2017-05-31 NOTE — Telephone Encounter (Signed)
New message    Pt is calling because her PCP told her to call because her CHF is getting worse.

## 2017-06-04 NOTE — Telephone Encounter (Signed)
This encounter was created in error - please disregard.

## 2017-06-14 ENCOUNTER — Ambulatory Visit: Payer: Self-pay

## 2017-06-14 NOTE — Telephone Encounter (Signed)
Records received and scanned under media tab. No new orders at this time.

## 2017-06-28 ENCOUNTER — Telehealth: Payer: Self-pay

## 2017-06-28 ENCOUNTER — Ambulatory Visit (INDEPENDENT_AMBULATORY_CARE_PROVIDER_SITE_OTHER): Payer: Medicaid - Out of State | Admitting: Interventional Cardiology

## 2017-06-28 ENCOUNTER — Ambulatory Visit (INDEPENDENT_AMBULATORY_CARE_PROVIDER_SITE_OTHER): Payer: Medicaid - Out of State | Admitting: Pharmacist

## 2017-06-28 ENCOUNTER — Encounter: Payer: Self-pay | Admitting: Interventional Cardiology

## 2017-06-28 VITALS — BP 126/82 | HR 86 | Ht 61.0 in | Wt 129.8 lb

## 2017-06-28 VITALS — BP 120/78 | HR 83 | Ht 61.0 in | Wt 129.0 lb

## 2017-06-28 DIAGNOSIS — I25118 Atherosclerotic heart disease of native coronary artery with other forms of angina pectoris: Secondary | ICD-10-CM

## 2017-06-28 DIAGNOSIS — Z9889 Other specified postprocedural states: Secondary | ICD-10-CM

## 2017-06-28 DIAGNOSIS — I5022 Chronic systolic (congestive) heart failure: Secondary | ICD-10-CM | POA: Diagnosis not present

## 2017-06-28 DIAGNOSIS — E785 Hyperlipidemia, unspecified: Secondary | ICD-10-CM

## 2017-06-28 MED ORDER — ALIROCUMAB 75 MG/ML ~~LOC~~ SOPN: 1.0000 "pen " | PEN_INJECTOR | SUBCUTANEOUS | 11 refills | Status: DC

## 2017-06-28 MED ORDER — LOSARTAN POTASSIUM 25 MG PO TABS: 25.0000 mg | ORAL_TABLET | Freq: Every day | ORAL | 11 refills | Status: DC

## 2017-06-28 NOTE — Progress Notes (Signed)
Patient ID: Lindsay Camacho                 DOB: [birth year]                    MRN: [id number]     HPI: Lindsay Camacho is a 51 y.o. female patient of Dr Eldridge Dace referred to lipid clinic by Robbie Lis, PA. PMH is significant for CHF with LVEF 20-25%, CAD s/p lateral wall MI in 2015 treated with PCI to the CXF, PVD, mitral valve repair, TIA, and tobacco abuse.  Pt presents today in good spirits. She reports tolerating her atorvastatin and ezetimibe well. She is unable to exercise due to SOB. She tries to eat a low fat low sodium diet. She reports only taking 2 doses of her olmesartan before stopping therapy due to low BP readings in the 80s/50s. She has some CP which she will discuss today with Dr Eldridge Dace in office.  Current Medications: atorvastatin 80mg  daily, ezetimibe 10mg  daily Risk Factors: MI, PVD, TIA, tobacco abuse LDL goal: 70mg /dL  Diet: Breakfast - oatmeal or cereal. Lunch - fruit. Dinner - potato soup, cornbread. Does not add salt to food. Does not eat fried food or fast food. Drinks low fat milk. Also drinks water and [facility].  Exercise: SOB with exercise and walking up steps.   Family History: Mother with CAD at age 10, stroke, HTN, and MI. Father with CAD at age 33, MI, and stroke.  Social History: Former smoker 1/2 PPD, quit in 2017. Denies alcohol use. Uses marijuana.  Labs: 05/10/17: TC 139, TG 144, HDL 29, LDL 81 (atorvastatin 80mg  daily, ezetimibe 10mg  daily)  Past Medical History:  Diagnosis Date  . Acute myocardial infarction of other lateral wall, initial episode of care 01/2013   DES CFX  . Aortoiliac occlusive disease (HCC) 08/22/2016  . CAD (coronary artery disease) 11/13/2011   50% ? proximal LCx stenosis per Cath at [facility]  . Chest pain, atypical, muscular skelatal   01/09/2014  . Dyspnea   . Heart failure (HCC)   . HLD (hyperlipidemia)   . Hypertension   . Intermediate coronary syndrome (HCC)   . LBBB (left bundle branch block)   . Mitral  regurgitation   . Nonischemic cardiomyopathy (HCC) 01/26/2017  . PVD (peripheral vascular disease) (HCC)    Right CFA stenosis per report on 11/14/2011  . 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/25/2016   Edwards McArthy-Adams IMR ETLogix ring annuloplasty (Model 4100, Serial [id number], size 26) placed via right mini thoracotomy approach  . Stenosis of left subclavian artery (HCC) 08/22/2016  . Stroke [facility])    tia with no deficits    Current Outpatient Medications on File Prior to Visit  Medication Sig Dispense Refill  . acetaminophen (TYLENOL) 500 MG tablet Take 1,000 mg by mouth every 6 (six) hours as needed for headache (pain).    Marland Kitchen aspirin EC 81 MG tablet Take 81 mg by mouth daily.    Marland Kitchen atorvastatin (LIPITOR) 80 MG tablet Take 1 tablet (80 mg total) by mouth daily. (Patient taking differently: Take 80 mg by mouth at bedtime. ) 30 tablet 11  . carvedilol (COREG) 6.25 MG tablet Take 6.25 mg by mouth 2 (two) times daily with a meal.    . clopidogrel (PLAVIX) 75 MG tablet Take 1 tablet (75 mg total) by mouth daily with breakfast. 30 tablet 11  . ezetimibe (ZETIA) 10 MG tablet Take 1 tablet (10 mg total) by mouth  daily. 90 tablet 3  . furosemide (LASIX) 40 MG tablet Take 40 mg by mouth daily as needed for fluid or edema (shortness of breath).     . olmesartan (BENICAR) 20 MG tablet Take 20 mg by mouth daily.     No current facility-administered medications on file prior to visit.     Allergies  Allergen Reactions  . Aldomet [Methyldopa] Other (See Comments)    Severe hypotension  . Brilinta [Ticagrelor] Shortness Of Breath  . Other Other (See Comments)    Nuclear Stress Test Medication caused seizures  . Coumadin [Warfarin Sodium] Swelling    Arm swelled  . Penicillins Rash    Has patient had a PCN reaction causing immediate rash, facial/tongue/throat swelling, SOB or lightheadedness with hypotension: Yes Has patient had a PCN reaction causing severe rash involving mucus membranes  or skin necrosis: No Has patient had a PCN reaction that required hospitalization: No Has patient had a PCN reaction occurring within the last 10 years: No If all of the above answers are "NO", then may proceed with Cephalosporin use.     Assessment/Plan:  1. Hyperlipidemia - LDL 81 on atorvastatin 80mg  daily and ezetimibe 10mg  daily, above goal < 70mg /dL due to hx of ASCVD. Discussed PCSK9i therapy including expected benefits, side effects, and injection technique. Will submit prior authorization for Praluent 75mg /mL Q2W.  2. CHF - LVEF now down to 20%. Pt stopped olmesartan due to hypotension, this also happened with lisinopril in the past. Will start low dose losartan 25mg  daily for mortality benefit in HFrEF. Will continue carvedilol 6.25mg  BID. Pt counseled on < 2g sodium and 2L fluid restrictions. She plans to purchase a scale at the end of the month. F/u in HTN clinic in 1 month for BP check and BMET. Will plan to start spironolactone if BP allows.   [name] E. Supple, PharmD, BCACP, CPP [facility] [address] [place], [place] [address] Phone: [phone]; Fax: [phone] 06/28/2017 12:09 PM

## 2017-06-28 NOTE — Addendum Note (Signed)
Addended by: Michaelann Gunnoe E on: 06/28/2017 12:49 PM   Modules accepted: Orders

## 2017-06-28 NOTE — Patient Instructions (Addendum)
Start taking losartan 25mg  once a day for your heart. Monitor your blood pressure  I will start the paperwork process for Praluent injections for your cholesterol  Use your furosemide as needed for swelling  Look for a scale at the end of the month and start weighing yourself every morning  Limit your sodium to < 2 grams a day  Limit your fluid to < 2 liters a day  Follow up in clinic in 1 month for blood pressure check and lab work

## 2017-06-28 NOTE — Patient Instructions (Signed)

## 2017-06-28 NOTE — Progress Notes (Signed)
Cardiology Office Note   Date:  06/28/2017   ID:  Lindsay Camacho, DOB 1965-08-15, MRN [id number]  PCP:  Montez Hageman, DO    No chief complaint on file.  CAD  Wt Readings from Last 3 Encounters:  06/28/17 129 lb (58.5 kg)  06/28/17 129 lb 12 oz (58.9 kg)  04/27/17 132 lb (59.9 kg)       History of Present Illness: Lindsay Camacho is a 52 y.o. female   with ah/o CAD s/plateral wall MI in 2015treated with PCI to the CXF. She had a repeat cath in 09/2014 showing patent stent. She unfortunately had a RP bleed post cath and required vascular surgery. She had another R/LHC prior to valve surgery 08/21/16 that showed patent CFX stent with minal restenosis + mild nonobstructive CAD in the RCA and LAD. She also has h/o chronic systolic HF w/ EF of 35-40%, severe MR and tobacco abuse.   She underwent minimally invasive mitral valve repair, per Dr. Cornelius Moras, for severe symptomatic MR. Surgery was 08/25/16. Post op recovery was fairly uneventfully.    She was admitted in [place], [place] on 1/15 with worsening SOB r/t A/C systolic heart failure. She was diuresed with IV lasix and was transitioned to PO. She was transferred on 1/20 for ICD evaluation after an echo showed worsening EF of 20-25% (35-40% 09/2016). She did not require further diuresis.SuccessfulMedtronicICD implantation on 1/23but unsuccessful LV leaddue to anatomy. Has HIS and RV lead. Interrogation and CXR normal postop. Complication of hematoma with moderate pain at insertion site, so she was kept an additional night for observation. Only able to tolerate low dose BB due to hypotension. Lasix only as needed.  Atypical chest pain at the site of her AICD and under her left arm.  Not like prior MI pain.  Otherwise, she is much better today.  She has intermittent swelling and still uses the prn Lasix.   Denies : Dizziness. Nitroglycerin use. Orthopnea. Palpitations. Paroxysmal nocturnal dyspnea.  Syncope.   Breathing and edema  are improved.  Past Medical History:  Diagnosis Date  . Acute myocardial infarction of other lateral wall, initial episode of care 01/2013   DES CFX  . Aortoiliac occlusive disease (HCC) 08/22/2016  . CAD (coronary artery disease) 11/13/2011   50% ? proximal LCx stenosis per Cath at [facility]  . Chest pain, atypical, muscular skelatal   01/09/2014  . Dyspnea   . Heart failure (HCC)   . HLD (hyperlipidemia)   . Hypertension   . Intermediate coronary syndrome (HCC)   . LBBB (left bundle branch block)   . Mitral regurgitation   . Nonischemic cardiomyopathy (HCC) 01/26/2017  . PVD (peripheral vascular disease) (HCC)    Right CFA stenosis per report on 11/14/2011  . 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/25/2016   Edwards McArthy-Adams IMR ETLogix ring annuloplasty (Model 4100, Serial [id number], size 26) placed via right mini thoracotomy approach  . Stenosis of left subclavian artery (HCC) 08/22/2016  . Stroke [facility])    tia with no deficits    Past Surgical History:  Procedure Laterality Date  . APPENDECTOMY    . BIV ICD INSERTION CRT-D N/A 01/24/2017   Procedure: BIV ICD INSERTION CRT-D;  Surgeon: Marinus Maw, MD;  Location: [facility] INVASIVE CV LAB;  Service: Cardiovascular;  Laterality: N/A;  . CARDIAC CATHETERIZATION  04/14/2013   Non-obstructive disease, patent CFX stent  . CARDIAC CATHETERIZATION N/A 09/21/2014   Procedure: Left Heart Cath and Coronary Angiography;  Surgeon: Lennette Bihari, MD;  Location: [facility] INVASIVE CV LAB;  Service: Cardiovascular;  Laterality: N/A;  . CORONARY ANGIOPLASTY WITH STENT PLACEMENT  01/17/2013   mild disease except 99% CFX, rx with  2.5 x 28 Alpine drug-eluting stent   . GROIN DEBRIDEMENT Right 04/14/2013   Procedure: Emergency Evacuation of Retroperitoneal Hematoma and Repair Right External Iliac Artery Pseudoaneurysm    ;  Surgeon: Sherren Kerns, MD;  Location: [facility] OR;  Service: Vascular;  Laterality: Right;  . LEFT HEART CATHETERIZATION WITH CORONARY  ANGIOGRAM N/A 01/18/2013   Procedure: LEFT HEART CATHETERIZATION WITH CORONARY ANGIOGRAM;  Surgeon: Corky Crafts, MD;  Location: [facility] CATH LAB;  Service: Cardiovascular;  Laterality: N/A;  . LEFT HEART CATHETERIZATION WITH CORONARY ANGIOGRAM N/A 04/14/2013   Procedure: LEFT HEART CATHETERIZATION WITH CORONARY ANGIOGRAM;  Surgeon: Corky Crafts, MD;  Location: [facility] CATH LAB;  Service: Cardiovascular;  Laterality: N/A;  . MITRAL VALVE REPAIR Right 08/25/2016   Procedure: MINIMALLY INVASIVE MITRAL VALVE REPAIR;  Surgeon: Purcell Nails, MD;  Location: [facility] OR;  Service: Open Heart Surgery;  Laterality: Right;  . MULTIPLE EXTRACTIONS WITH ALVEOLOPLASTY N/A 08/23/2016   Procedure: Extraction of tooth #'s 17, 20-23, 26-29 with alveoloplasty;  Surgeon: [name], DDS;  Location: [facility] OR;  Service: Oral Surgery;  Laterality: N/A;  . PERCUTANEOUS CORONARY STENT INTERVENTION (PCI-S)  01/18/2013   Procedure: PERCUTANEOUS CORONARY STENT INTERVENTION (PCI-S);  Surgeon: Corky Crafts, MD;  Location: [facility] CATH LAB;  Service: Cardiovascular;;  . RIGHT/LEFT HEART CATH AND CORONARY ANGIOGRAPHY N/A 08/21/2016   Procedure: RIGHT/LEFT HEART CATH AND CORONARY ANGIOGRAPHY;  Surgeon: Kathleene Hazel, MD;  Location: [facility] INVASIVE CV LAB;  Service: Cardiovascular;  Laterality: N/A;  . TEE WITHOUT CARDIOVERSION N/A 03/06/2014   Procedure: TRANSESOPHAGEAL ECHOCARDIOGRAM (TEE);  Surgeon: Lars Masson, MD;  Location: [facility] ENDOSCOPY;  Service: Cardiovascular;  Laterality: N/A;  . TEE WITHOUT CARDIOVERSION N/A 08/18/2016   Procedure: TRANSESOPHAGEAL ECHOCARDIOGRAM (TEE);  Surgeon: Lewayne Bunting, MD;  Location: [facility] ENDOSCOPY;  Service: Cardiovascular;  Laterality: N/A;  . TEE WITHOUT CARDIOVERSION N/A 08/25/2016   Procedure: TRANSESOPHAGEAL ECHOCARDIOGRAM (TEE);  Surgeon: Purcell Nails, MD;  Location: [facility] OR;  Service: Open Heart Surgery;  Laterality: N/A;  . TUBAL LIGATION       Current Outpatient  Medications  Medication Sig Dispense Refill  . acetaminophen (TYLENOL) 500 MG tablet Take 1,000 mg by mouth every 6 (six) hours as needed for headache (pain).    Marland Kitchen aspirin EC 81 MG tablet Take 81 mg by mouth daily.    Marland Kitchen atorvastatin (LIPITOR) 80 MG tablet Take 1 tablet (80 mg total) by mouth daily. (Patient taking differently: Take 80 mg by mouth at bedtime. ) 30 tablet 11  . carvedilol (COREG) 6.25 MG tablet Take 6.25 mg by mouth 2 (two) times daily with a meal.    . clopidogrel (PLAVIX) 75 MG tablet Take 1 tablet (75 mg total) by mouth daily with breakfast. 30 tablet 11  . ezetimibe (ZETIA) 10 MG tablet Take 1 tablet (10 mg total) by mouth daily. 90 tablet 3  . furosemide (LASIX) 40 MG tablet Take 40 mg by mouth daily as needed for fluid or edema (shortness of breath).     . losartan (COZAAR) 25 MG tablet Take 1 tablet (25 mg total) by mouth daily. 30 tablet 11   No current facility-administered medications for this visit.     Allergies:   Aldomet [methyldopa]; Brilinta [ticagrelor]; Other; Coumadin [warfarin sodium];  and Penicillins    Social History:  The patient  reports that she quit smoking about 22 months ago. Her smoking use included cigarettes. She smoked 0.50 packs per day. She has never used smokeless tobacco. She reports that she has current or past drug history. Drug: Marijuana. She reports that she does not drink alcohol.   Family History:  The patient's family history includes Bladder Cancer in her mother; CAD (age of onset: 65) in her mother; CAD (age of onset: 36) in her father; Heart attack in her father and mother; Heart disease in her father and mother; Hypertension in her mother; Lung cancer in her mother; Stroke in her father and mother.    ROS:  Please see the history of present illness.   Otherwise, review of systems are positive for occasional swelling.   All other systems are reviewed and negative.    PHYSICAL EXAM: VS:  BP 120/78   Pulse 83   Ht 5\' 1"  (1.549 m)    Wt 129 lb (58.5 kg)   SpO2 98%   BMI 24.37 kg/m  , BMI Body mass index is 24.37 kg/m. GEN: Well nourished, well developed, in no acute distress  HEENT: normal  Neck: no JVD, carotid bruits, or masses Cardiac: RRR; no murmurs, rubs, or gallops,no edema  Respiratory:  clear to auscultation bilaterally, normal work of breathing GI: soft, nontender, nondistended, + BS MS: no deformity or atrophy  Skin: warm and dry, no rash Neuro:  Strength and sensation are intact Psych: euthymic mood, full affect   EKG:   The ekg ordered today demonstrates *ventricular paced rhythm   Recent Labs: 01/21/2017: B Natriuretic Peptide 322.4; Hemoglobin 13.4; Magnesium 2.0; Platelets 229; TSH 1.700 01/26/2017: BUN 11; Creatinine, Ser 0.79; Potassium 4.0; Sodium 136 05/10/2017: ALT 12   Lipid Panel    Component Value Date/Time   CHOL 139 05/10/2017 0843   TRIG 144 05/10/2017 0843   HDL 29 (L) 05/10/2017 0843   CHOLHDL 4.8 (H) 05/10/2017 0843   CHOLHDL 5.1 01/23/2017 0420   VLDL 25 01/23/2017 0420   LDLCALC 81 05/10/2017 0843     Other studies Reviewed: Additional studies/ records that were reviewed today with results demonstrating: 1/19 EF 25%.   ASSESSMENT AND PLAN:  1. CAD/Old MI: Atypical chest pain at the site of her AICD and under her left arm.  Not like prior MI pain.  2. MV repair: Continue SBE prophylaxis for dental procedures. 3. Chronic systolic heart failure: Lasix as needed only, due to borderline BP.  AICD in place.  Start low dose Losartan today.  If BP tolerates, will add Spironolactone in one month.  WOuld not add regular Lasix at this time given the plan for other changes with meds that can increase length of life and LV function. 4. Hyperlipidemia: LDL 81 in 5/19.  Investigating PCSK9 inhibitor to get her LDL to 70.     Current medicines are reviewed at length with the patient today.  The patient concerns regarding her medicines were addressed.  The following changes have  been made:  No change  Labs/ tests ordered today include: BMet in one month No orders of the defined types were placed in this encounter.   Recommend 150 minutes/week of aerobic exercise Low fat, low carb, high fiber diet recommended  Disposition:   FU in 4 weeks with PharmD   Signed, Lance Muss, MD  06/28/2017 12:23 PM    [facility] [address] [place], [place], [place]  [address] Phone: [phone]; Fax: [phone]

## 2017-06-28 NOTE — Telephone Encounter (Signed)
   [facility] Pre-operative Risk Assessment    Request for surgical clearance:  1. What type of surgery is being performed? "Endoscopic procedure"   2. When is this surgery scheduled? pending  3. What type of clearance is required (medical clearance vs. Pharmacy clearance to hold med vs. Both)? both  4. Are there any medications that need to be held prior to surgery and how long? Plavix, aspirin  5. Practice name and name of physician performing surgery? [facility]   6. What is your office phone number [phone]   7.   What is your office fax number [phone]  8.   Anesthesia type (None, local, MAC, general)?  Propofol IV   Joaquim Lai 06/28/2017, 1:37 PM  _________________________________________________________________   (provider comments below)

## 2017-06-28 NOTE — Telephone Encounter (Signed)
   Primary Cardiologist:Jayadeep Eldridge Dace, MD  Chart reviewed as part of pre-operative protocol coverage.   Patient just seen today by Dr. Eldridge Dace - will route to him for pre-op clearance regarding holding of Plavix and aspirin. Would also like his input whether he personally feels her h/o MV repair warrants SBE prior to an endoscopic procedure (recent varying opinions in service line regarding structural heart prophylaxis). Dr. Eldridge Dace, - Please route response to P CV DIV PREOP (the pre-op pool). Thank you.  Will also cc to preop callback team to call gastroenterology center to get more info about what actually is being done. Thanks.   Laurann [place], PA-C 06/28/2017, 5:14 PM

## 2017-06-29 ENCOUNTER — Other Ambulatory Visit: Payer: Self-pay | Admitting: Physician Assistant

## 2017-06-29 ENCOUNTER — Telehealth: Payer: Self-pay | Admitting: Interventional Cardiology

## 2017-06-29 MED ORDER — CLINDAMYCIN HCL 300 MG PO CAPS: 600.0000 mg | ORAL_CAPSULE | Freq: Once | ORAL | 0 refills | Status: AC

## 2017-06-29 NOTE — Telephone Encounter (Signed)
Pt is having a Colonoscopy with IV Propofol IV sedation.

## 2017-06-29 NOTE — Telephone Encounter (Signed)
Spoke with patient and she stated she was not aware that she was having an upcoming procedure. I informed her that she should contact her Gastro doctor and request some information as to what is going on. I informed her of the Aspirin and Plavix instructions and the clindamycin instructions as well. Patient voiced understanding.

## 2017-06-29 NOTE — Telephone Encounter (Signed)
New message:      Pt is returning a call for the pre-op pool.

## 2017-06-29 NOTE — Telephone Encounter (Signed)
OK to hold aspirin and plavix 5 days prior to endoscopy.  Since valve surgery is fairly recent, would give SBE prophylaxis.

## 2017-06-29 NOTE — Telephone Encounter (Signed)
Left message for patient to contact office.

## 2017-06-29 NOTE — Telephone Encounter (Addendum)
   Dr. Eldridge Dace has said that she is okay to hold the aspirin and Plavix 5 days prior to endoscopy.  He also recommends SBE prophylaxis.  This will be ordered, taking into account her allergy to penicillin.  A prescription for clindamycin 600 mg has been sent to her pharmacy in [place].    **Preop call back pool please let the patient know that she should take this 1 hour prior to the procedure.  She is having a colonoscopy with propofol for sedation.  [facility]   office phone number [phone] office fax number [phone]  I will send this to the requesting physician and remove this from the preop pool.  Theodore Demark, PA-C 06/29/2017 12:54 PM Beeper [phone]

## 2017-07-03 ENCOUNTER — Telehealth: Payer: Self-pay | Admitting: Pharmacist

## 2017-07-03 NOTE — Telephone Encounter (Signed)
Pt called clinic to report dizziness since starting losartan 25mg  daily. BP readings were 117/62. Reports she has also had a fall, however her BP was normal. She has not had any episodes of dizziness in the past. She only took 2 doses of losartan and has not had dizziness since stopping therapy. LVEF 20% so ideally want pt to continue on ARB if able. She is willing to cut tablets in half and try losartan 12.5mg  for the next few days. She will call with any issues and will keep f/u appt.

## 2017-07-04 ENCOUNTER — Telehealth: Payer: Self-pay | Admitting: Pharmacist

## 2017-07-04 MED ORDER — ALIROCUMAB 75 MG/ML ~~LOC~~ SOPN: 1.0000 "pen " | PEN_INJECTOR | SUBCUTANEOUS | 11 refills | Status: DC

## 2017-07-04 NOTE — Telephone Encounter (Signed)
Patient approved for Praluent. Sent rx to [facility] per patient request. [facility] will call our office if they are unable to fill the Praluent.

## 2017-07-10 MED ORDER — ALIROCUMAB 75 MG/ML ~~LOC~~ SOPN: 1.0000 "pen " | PEN_INJECTOR | SUBCUTANEOUS | 11 refills | Status: DC

## 2017-07-10 NOTE — Addendum Note (Signed)
Addended by: SUPPLE, [name] E on: 07/10/2017 10:03 AM   Modules accepted: Orders

## 2017-07-10 NOTE — Telephone Encounter (Signed)
Local pharmacy and [facility] pharmacy unable to fill rx per patient's insurance limitations. Called insurance and rx will need to be filled through [facility]. Rx has been sent and pt made aware.

## 2017-07-26 ENCOUNTER — Ambulatory Visit (INDEPENDENT_AMBULATORY_CARE_PROVIDER_SITE_OTHER): Payer: Medicaid - Out of State | Admitting: Pharmacist

## 2017-07-26 VITALS — BP 134/86 | HR 72

## 2017-07-26 DIAGNOSIS — I10 Essential (primary) hypertension: Secondary | ICD-10-CM | POA: Diagnosis not present

## 2017-07-26 DIAGNOSIS — R0789 Other chest pain: Secondary | ICD-10-CM

## 2017-07-26 DIAGNOSIS — I5031 Acute diastolic (congestive) heart failure: Secondary | ICD-10-CM

## 2017-07-26 DIAGNOSIS — E785 Hyperlipidemia, unspecified: Secondary | ICD-10-CM | POA: Diagnosis not present

## 2017-07-26 MED ORDER — SPIRONOLACTONE 25 MG PO TABS: 12.5000 mg | ORAL_TABLET | Freq: Every day | ORAL | 11 refills | Status: DC

## 2017-07-26 MED ORDER — LOSARTAN POTASSIUM 25 MG PO TABS: 12.5000 mg | ORAL_TABLET | Freq: Every day | ORAL | 11 refills | Status: DC

## 2017-07-26 NOTE — Patient Instructions (Addendum)
Start taking spironolactone 12.5mg  daily (1/2 tablet)  Continue taking your other medications  Check your blood pressure at home and record your readings  Follow up in clinic in 3 weeks for a blood pressure check

## 2017-07-26 NOTE — Progress Notes (Signed)
Patient ID: Lindsay Camacho                 DOB: [birth year]                    MRN: [id number]     HPI: Lindsay Camacho is a 52 y.o. female patient of Dr Eldridge Dace referred to lipid clinic by Robbie Lis, PA. PMH is significant for CHF with LVEF 20-25%, CAD s/p lateral wall MI in 2015 treated with PCI to the CXF, PVD, mitral valve repair, TIA, and tobacco abuse. At last visit, pt was started on losartan 25mg  daily. She called clinic a week later with reports of hypotension which she had also previously experienced on lisinopril and olmesartan. Her losartan was decreased to 12.5mg  daily in the hopes to keep her on ARB therapy due to HFrEF. Paperwork was also started and approved for PCSK9i therapy.  Pt presents today in good spirits. She is tolerating her 1/2 tablet of losartan well. She felt dizzy and fell once when she took the full dose of losartan for 2 days. She has been drinking less caffeine. She is unable to exercise due to SOB. She tries to eat a low fat low sodium diet. Pt reports 2 days ago that it felt like someone was "wringing her heart." This resolved however she has had left arm pain for the past 2 days. EKG done today to assess symptoms.   She also brings her Praluent today for first injection which she successfully administers in clinic.  Current Medications: atorvastatin 80mg  daily, ezetimibe 10mg  daily, Praluent 75mg  Q2W Risk Factors: MI, PVD, TIA, tobacco abuse LDL goal: 70mg /dL  Diet: Breakfast - oatmeal or cereal. Lunch - fruit. Dinner - potato soup, cornbread. Does not add salt to food. Does not eat fried food or fast food. Drinks low fat milk. Also drinks water and [facility].  Exercise: SOB with exercise and walking up steps.   Family History: Mother with CAD at age 70, stroke, HTN, and MI. Father with CAD at age 32, MI, and stroke.  Social History: Former smoker 1/2 PPD, quit in 2017. Denies alcohol use. Uses marijuana.  Labs: 05/10/17: TC 139, TG 144, HDL 29, LDL  81 (atorvastatin 80mg  daily, ezetimibe 10mg  daily)  Past Medical History:  Diagnosis Date  . Acute myocardial infarction of other lateral wall, initial episode of care 01/2013   DES CFX  . Aortoiliac occlusive disease (HCC) 08/22/2016  . CAD (coronary artery disease) 11/13/2011   50% ? proximal LCx stenosis per Cath at [facility]  . Chest pain, atypical, muscular skelatal   01/09/2014  . Dyspnea   . Heart failure (HCC)   . HLD (hyperlipidemia)   . Hypertension   . Intermediate coronary syndrome (HCC)   . LBBB (left bundle branch block)   . Mitral regurgitation   . Nonischemic cardiomyopathy (HCC) 01/26/2017  . PVD (peripheral vascular disease) (HCC)    Right CFA stenosis per report on 11/14/2011  . 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/25/2016   Edwards McArthy-Adams IMR ETLogix ring annuloplasty (Model 4100, Serial [id number], size 26) placed via right mini thoracotomy approach  . Stenosis of left subclavian artery (HCC) 08/22/2016  . Stroke [facility])    tia with no deficits    Current Outpatient Medications on File Prior to Visit  Medication Sig Dispense Refill  . acetaminophen (TYLENOL) 500 MG tablet Take 1,000 mg by mouth every 6 (six) hours as needed for headache (pain).    Marland Kitchen  Alirocumab (PRALUENT) 75 MG/ML SOPN Inject 1 pen into the skin every 14 (fourteen) days. 2 pen 11  . aspirin EC 81 MG tablet Take 81 mg by mouth daily.    Marland Kitchen atorvastatin (LIPITOR) 80 MG tablet Take 1 tablet (80 mg total) by mouth daily. (Patient taking differently: Take 80 mg by mouth at bedtime. ) 30 tablet 11  . carvedilol (COREG) 6.25 MG tablet Take 6.25 mg by mouth 2 (two) times daily with a meal.    . clopidogrel (PLAVIX) 75 MG tablet Take 1 tablet (75 mg total) by mouth daily with breakfast. 30 tablet 11  . ezetimibe (ZETIA) 10 MG tablet Take 1 tablet (10 mg total) by mouth daily. 90 tablet 3  . furosemide (LASIX) 40 MG tablet Take 40 mg by mouth daily as needed for fluid or edema (shortness of breath).     .  losartan (COZAAR) 25 MG tablet Take 0.5 tablets (12.5 mg total) by mouth daily.     No current facility-administered medications on file prior to visit.     Allergies  Allergen Reactions  . Aldomet [Methyldopa] Other (See Comments)    Severe hypotension  . Brilinta [Ticagrelor] Shortness Of Breath  . Other Other (See Comments)    Nuclear Stress Test Medication caused seizures  . Coumadin [Warfarin Sodium] Swelling    Arm swelled  . Penicillins Rash    Has patient had a PCN reaction causing immediate rash, facial/tongue/throat swelling, SOB or lightheadedness with hypotension: Yes Has patient had a PCN reaction causing severe rash involving mucus membranes or skin necrosis: No Has patient had a PCN reaction that required hospitalization: No Has patient had a PCN reaction occurring within the last 10 years: No If all of the above answers are "NO", then may proceed with Cephalosporin use.     Assessment/Plan:  1. Hyperlipidemia - LDL 81 on atorvastatin 80mg  daily and ezetimibe 10mg  daily, above goal < 70mg /dL due to hx of ASCVD. First Praluent injection done in clinic today. Will recheck lipids after at least 3 injections.  2. CHF - LVEF now down to 20%. Pt stopped olmesartan due to hypotension, this also happened with lisinopril in the past. She did not tolerate losartan 25mg  daily but is tolerating 12.5mg  daily. Will start spironolactone 12.5mg  daily and continue carvedilol 6.25mg  BID and losartan 12.5mg  daily. Pt counseled on < 2g sodium and 2L fluid restrictions. F/u in clinic in 3 weeks for BP check and BMET.  3. Chest pain - EKG done today in clinic, reviewed by DOD and patient's cardiologist Dr Eldridge Dace. EKG was stable and no further work up required today.   Juwuan Sedita E. Supple, PharmD, BCACP, CPP  [facility] [address] [place], [place] [address] Phone: [phone]; Fax: [phone] 07/26/2017 7:24 AM

## 2017-07-30 ENCOUNTER — Ambulatory Visit (INDEPENDENT_AMBULATORY_CARE_PROVIDER_SITE_OTHER): Payer: Medicaid - Out of State | Admitting: *Deleted

## 2017-07-30 DIAGNOSIS — I428 Other cardiomyopathies: Secondary | ICD-10-CM | POA: Diagnosis not present

## 2017-07-30 NOTE — Progress Notes (Signed)
Remote ICD transmission.   

## 2017-07-31 ENCOUNTER — Encounter: Payer: Self-pay | Admitting: Cardiology

## 2017-08-03 LAB — CUP PACEART REMOTE DEVICE CHECK
Brady Statistic AP VS Percent: 0.01 %
Brady Statistic AS VS Percent: 0.06 %
Date Time Interrogation Session: 20190729073524
HIGH POWER IMPEDANCE MEASURED VALUE: 75 Ohm
Implantable Lead Implant Date: 20190124
Implantable Lead Implant Date: 20190124
Implantable Lead Model: 6935
Implantable Pulse Generator Implant Date: 20190124
Lead Channel Impedance Value: 209 Ohm
Lead Channel Impedance Value: 304 Ohm
Lead Channel Impedance Value: 570 Ohm
Lead Channel Pacing Threshold Amplitude: 0.625 V
Lead Channel Pacing Threshold Amplitude: 0.75 V
Lead Channel Pacing Threshold Pulse Width: 0.4 ms
Lead Channel Pacing Threshold Pulse Width: 0.5 ms
Lead Channel Sensing Intrinsic Amplitude: 12.125 mV
Lead Channel Sensing Intrinsic Amplitude: 12.125 mV
Lead Channel Sensing Intrinsic Amplitude: 2.625 mV
Lead Channel Setting Pacing Amplitude: 1.25 V
Lead Channel Setting Pacing Amplitude: 1.75 V
Lead Channel Setting Pacing Pulse Width: 0.4 ms
Lead Channel Setting Pacing Pulse Width: 0.5 ms
MDC IDC LEAD IMPLANT DT: 20190124
MDC IDC LEAD LOCATION: 753859
MDC IDC LEAD LOCATION: 753860
MDC IDC LEAD LOCATION: 753860
MDC IDC MSMT BATTERY REMAINING LONGEVITY: 91 mo
MDC IDC MSMT BATTERY VOLTAGE: 2.99 V
MDC IDC MSMT LEADCHNL LV IMPEDANCE VALUE: 437 Ohm
MDC IDC MSMT LEADCHNL RA IMPEDANCE VALUE: 437 Ohm
MDC IDC MSMT LEADCHNL RA SENSING INTR AMPL: 2.625 mV
MDC IDC MSMT LEADCHNL RV IMPEDANCE VALUE: 437 Ohm
MDC IDC MSMT LEADCHNL RV PACING THRESHOLD AMPLITUDE: 1 V
MDC IDC MSMT LEADCHNL RV PACING THRESHOLD PULSEWIDTH: 0.4 ms
MDC IDC SET LEADCHNL RV PACING AMPLITUDE: 2.5 V
MDC IDC SET LEADCHNL RV SENSING SENSITIVITY: 0.3 mV
MDC IDC STAT BRADY AP VP PERCENT: 0.03 %
MDC IDC STAT BRADY AS VP PERCENT: 99.9 %
MDC IDC STAT BRADY RA PERCENT PACED: 0.04 %
MDC IDC STAT BRADY RV PERCENT PACED: 99.88 %

## 2017-08-20 ENCOUNTER — Ambulatory Visit (INDEPENDENT_AMBULATORY_CARE_PROVIDER_SITE_OTHER): Payer: Medicaid - Out of State | Admitting: Pharmacist

## 2017-08-20 ENCOUNTER — Encounter: Payer: Self-pay | Admitting: Thoracic Surgery (Cardiothoracic Vascular Surgery)

## 2017-08-20 ENCOUNTER — Ambulatory Visit: Payer: Medicaid - Out of State | Admitting: Thoracic Surgery (Cardiothoracic Vascular Surgery)

## 2017-08-20 ENCOUNTER — Encounter: Payer: Self-pay | Admitting: Pharmacist

## 2017-08-20 VITALS — BP 97/60 | HR 74 | Resp 20 | Ht 61.0 in | Wt 134.0 lb

## 2017-08-20 VITALS — BP 120/76 | HR 71

## 2017-08-20 DIAGNOSIS — I10 Essential (primary) hypertension: Secondary | ICD-10-CM

## 2017-08-20 DIAGNOSIS — I340 Nonrheumatic mitral (valve) insufficiency: Secondary | ICD-10-CM | POA: Diagnosis not present

## 2017-08-20 DIAGNOSIS — Z9889 Other specified postprocedural states: Secondary | ICD-10-CM | POA: Diagnosis not present

## 2017-08-20 NOTE — Progress Notes (Signed)
Patient ID: Lindsay Camacho                 DOB: October 09, 1965                      MRN: [id number]     HPI: Lindsay Camacho is a 52 y.o. female patient of Dr. Eldridge Dace who presents today for hypertension evaluation. PMH significant for CHF with LVEF 20-25%, CAD s/p lateral wall MI in 2015 treated with PCI to the CXF, PVD, mitral valve repair, TIA, and tobacco abuse. At last visit, spironolactone 12.5 mg daily was started. Patient was previously on losartan 25 mg, however experienced hypotension and was reduced to 12.5 mg daily which she was tolerating at her last visit on 07/26/17. Patient is also on carvedilol 6.25 mg BID for HFrEF. Patient's BP at last clinic visit on 07/26/17 was 134/86. She is also due for her follow-up labs for Praluent after 8/22 which we will provide an order for her to obtain labs near her home in [place].  Patient presents for BP management. Patient endorses feeling weak and having sporadic BP readings at home. Most of the weakness has been this past week. She checks her BP when she feels this way and says that her pressures are low when that happens. Has been at the lowest (88/56) once this past week but doesn't happen consistently every week. Denies SOB, dizziness, chest pain. She had dizziness and weakness while taking losartan 25 mg. Her symptoms now are better than they were when she was on the losartan 25 mg. Since starting the spironolactone, her BPs and weakness have been around the same as to when she was on the losartan 12.5 mg and no spironolatone. She is also interested in starting potassium again as she has been having more leg jerking and cramping. She states that she has been tolerating the Praluent injections well without any side effects and her third dose is on 8/22.   Current HTN meds:  Losartan 12.5 mg QD Spironolactone 12.5 mg QD Carvedilol 6.25 mg BID Furosemide 40 mg QD prn edema  Previously tried:  Lisinopril 2.5 mg, 5 mg, 10 mg QD- hypotension Metoprolol  tartrate 12.5 mg BID Metoprolol succinate 25 mg QD Olmesartan 20 mg QD- hypotension Entresto 24/26 mg BID- hypotension  BP goal: <130/80  Family History:  Mother with CAD at age 76, stroke, HTN, and MI. Father with CAD at age 3, MI, and stroke.  Social History:  Former smoker 1/2 PPD, quit in 2017. Denies alcohol use. Uses marijuana.  Diet: Breakfast - oatmeal or cereal. Lunch - fruit. Dinner - potato soup, cornbread. Does not add salt to food. Does not eat fried food or fast food. Drinks low fat milk. Also drinks water and [facility].  Exercise: SOB with exercise and walking up steps.   Home BP readings: has home wrist cuff (has not brought it in to get it checked): 120s/60s, lowest 88/56, highest 146/91  Wt Readings from Last 3 Encounters:  06/28/17 129 lb (58.5 kg)  06/28/17 129 lb 12 oz (58.9 kg)  04/27/17 132 lb (59.9 kg)   BP Readings from Last 3 Encounters:  08/20/17 120/76  07/26/17 134/86  06/28/17 120/78   Pulse Readings from Last 3 Encounters:  08/20/17 71  07/26/17 72  06/28/17 83    Renal function: CrCl cannot be calculated (Patient's most recent lab result is older than the maximum 21 days allowed.).  Past Medical History:  Diagnosis  Date  . Acute myocardial infarction of other lateral wall, initial episode of care 01/2013   DES CFX  . Aortoiliac occlusive disease (HCC) 08/22/2016  . CAD (coronary artery disease) 11/13/2011   50% ? proximal LCx stenosis per Cath at [facility]  . Chest pain, atypical, muscular skelatal   01/09/2014  . Dyspnea   . Heart failure (HCC)   . HLD (hyperlipidemia)   . Hypertension   . Intermediate coronary syndrome (HCC)   . LBBB (left bundle branch block)   . Mitral regurgitation   . Nonischemic cardiomyopathy (HCC) 01/26/2017  . PVD (peripheral vascular disease) (HCC)    Right CFA stenosis per report on 11/14/2011  . 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/25/2016   Edwards McArthy-Adams IMR ETLogix ring annuloplasty  (Model 4100, Serial [id number], size 26) placed via right mini thoracotomy approach  . Stenosis of left subclavian artery (HCC) 08/22/2016  . Stroke [facility])    tia with no deficits    Current Outpatient Medications on File Prior to Visit  Medication Sig Dispense Refill  . acetaminophen (TYLENOL) 500 MG tablet Take 1,000 mg by mouth every 6 (six) hours as needed for headache (pain).    . Alirocumab (PRALUENT) 75 MG/ML SOPN Inject 1 pen into the skin every 14 (fourteen) days. 2 pen 11  . aspirin EC 81 MG tablet Take 81 mg by mouth daily.    Marland Kitchen atorvastatin (LIPITOR) 80 MG tablet Take 1 tablet (80 mg total) by mouth daily. (Patient taking differently: Take 80 mg by mouth at bedtime. ) 30 tablet 11  . carvedilol (COREG) 6.25 MG tablet Take 6.25 mg by mouth 2 (two) times daily with a meal.    . clopidogrel (PLAVIX) 75 MG tablet Take 1 tablet (75 mg total) by mouth daily with breakfast. 30 tablet 11  . ezetimibe (ZETIA) 10 MG tablet Take 1 tablet (10 mg total) by mouth daily. 90 tablet 3  . furosemide (LASIX) 40 MG tablet Take 40 mg by mouth daily as needed for fluid or edema (shortness of breath).     . losartan (COZAAR) 25 MG tablet Take 0.5 tablets (12.5 mg total) by mouth daily. 15 tablet 11  . spironolactone (ALDACTONE) 25 MG tablet Take 0.5 tablets (12.5 mg total) by mouth daily. 15 tablet 11   No current facility-administered medications on file prior to visit.     Allergies  Allergen Reactions  . Aldomet [Methyldopa] Other (See Comments)    Severe hypotension  . Brilinta [Ticagrelor] Shortness Of Breath  . Other Other (See Comments)    Nuclear Stress Test Medication caused seizures  . Coumadin [Warfarin Sodium] Swelling    Arm swelled  . Penicillins Rash    Has patient had a PCN reaction causing immediate rash, facial/tongue/throat swelling, SOB or lightheadedness with hypotension: Yes Has patient had a PCN reaction causing severe rash involving mucus membranes or skin necrosis: No Has  patient had a PCN reaction that required hospitalization: No Has patient had a PCN reaction occurring within the last 10 years: No If all of the above answers are "NO", then may proceed with Cephalosporin use.     Blood pressure 120/76, pulse 71, SpO2 99 %.   Assessment/Plan: Hypertension: BP currently controlled (<130/80). Patient's home BP readings seem to vary greatly at home, however was controlled today in clinic. Continued patients home BP medications as currently prescribed. Encouraged patient to write down home BP readings as well as heart rate and bring log to next visit. Also  encouraged patient to bring her wrist cuff to the next visit to assess accuracy. Will follow-up with patient after BMET returns. Can consider increasing carvedilol to 12.5 mg BID at next visit if home BPs and HR allows. Advised pt to hold off on starting potassium supplement until BMET returns today.   Hyperlipidemia: Patient tolerating Praluent well. She is due for her 3rd dose this Thursday (8/22). Provided patient with order for LFTs and lipid panel so that she can have her labs drawn closer to her home. Will follow-up with patient after 8/22 when fasting lipid panel and LFTs are obtained.   Follow-up in HTN clinic on 09/25/17 to assess home BP readings and medication optimization.  Thank you, Freddie Apley. Cleatis Polka, PharmD  [facility]  08/20/2017 3:36 PM  Patient seen with Thomes Cake, PharmD Candidate

## 2017-08-20 NOTE — Patient Instructions (Addendum)

## 2017-08-20 NOTE — Progress Notes (Signed)
[address]       [name] [address]             [phone]     CARDIOTHORACIC SURGERY OFFICE NOTE  Referring Provider isHilty, Lisette Abu, MD Primary Cardiologist is Corky Crafts, MD PCP is Montez Hageman, DO   HPI:  Patient is a 52 year old female with history of coronary artery disease status post acute lateral wall myocardial infarction in January 2015, chronic systolic congestive heart failure, mitral regurgitation, left bundle branch block, hypertension, peripheral vascular disease, and hyperlipidemia who initially was hospitalized on 08/17/2016 with acute exacerbation of chronic congestive heart failureand nowreturns to the office today for follow-up status post minimally invasive mitral valve repair using a 26 mm ring annuloplastyon 08/25/2016 for severe ischemic mitral regurgitation. Her postoperative recovery was uneventful and she was last seen here in our office on November 20, 2016 at which time she was making slow but steady progress and overall getting along reasonably well.    Since then she underwent biventricular pacer ICD insertion on January 24, 2017.  Follow-up transthoracic echocardiogram revealed intact mitral valve repair with "no significant" mitral regurgitation.  Left ventricular systolic function remain low with ejection fraction estimated 25%.  She was last seen in follow-up by Dr. Eldridge Dace on June 28, 2017.  She returns to our office today for routine follow-up.  Overall she states that she feels "much improved" in comparison with surgery.  She reports stable symptoms of exertional shortness of breath that occur only with more strenuous exertion.  She is back doing most of what she wants to do and she reports no significant limitations.  She still has some soreness in her chest but she relates that across the left chest and not associated with her surgical incision.  Overall she reports that she feels quite well   Current  Outpatient Medications  Medication Sig Dispense Refill  . acetaminophen (TYLENOL) 500 MG tablet Take 1,000 mg by mouth every 6 (six) hours as needed for headache (pain).    . Alirocumab (PRALUENT) 75 MG/ML SOPN Inject 1 pen into the skin every 14 (fourteen) days. 2 pen 11  . aspirin EC 81 MG tablet Take 81 mg by mouth daily.    Marland Kitchen atorvastatin (LIPITOR) 80 MG tablet Take 1 tablet (80 mg total) by mouth daily. (Patient taking differently: Take 80 mg by mouth at bedtime. ) 30 tablet 11  . carvedilol (COREG) 6.25 MG tablet Take 6.25 mg by mouth 2 (two) times daily with a meal.    . clopidogrel (PLAVIX) 75 MG tablet Take 1 tablet (75 mg total) by mouth daily with breakfast. 30 tablet 11  . ezetimibe (ZETIA) 10 MG tablet Take 1 tablet (10 mg total) by mouth daily. 90 tablet 3  . furosemide (LASIX) 40 MG tablet Take 40 mg by mouth daily as needed for fluid or edema (shortness of breath).     . losartan (COZAAR) 25 MG tablet Take 0.5 tablets (12.5 mg total) by mouth daily. 15 tablet 11  . spironolactone (ALDACTONE) 25 MG tablet Take 0.5 tablets (12.5 mg total) by mouth daily. 15 tablet 11   No current facility-administered medications for this visit.       Physical Exam:   BP 97/60   Pulse 74   Resp 20   Ht 5\' 1"  (1.549 m)   Wt 134 lb (60.8 kg)   SpO2 98% Comment: RA  BMI 25.32 kg/m   General:  Well appearing  Chest:   clear  CV:   RRR w/out murmur  Incisions:  Completely healed  Abdomen:  Soft, non-distended, non-tender  Extremities:  Warm, well-perfused   Diagnostic Tests:  Transthoracic Echocardiography  Patient:    Lindsay Camacho, Lindsay Camacho MR #:       [id number] Study Date: 01/22/2017 Gender:     F Age:        51 Height:     154.9 cm Weight:     58.5 kg BSA:        1.6 m^2 Pt. Status: Room:       6E22C   ADMITTING    Donato Schultz, M.D.  ATTENDING    Zoila Shutter MD  PERFORMING   [facility], Inpatient  ORDERING     [facility]  REFERRING    [facility]   SONOGRAPHER  Sinda Du, RDCS  cc:  ------------------------------------------------------------------- LV EF: 25%  ------------------------------------------------------------------- History:   PMH:  Cardiomyopathy-unspecified.  Syncope.  Coronary artery disease.  Congestive heart failure.  Mitral valve disease. PMH:   Myocardial infarction.  Risk factors:  Hypertension.  ------------------------------------------------------------------- Study Conclusions  - Left ventricle: Abnormal septal motion. The cavity size was   moderately dilated. Wall thickness was increased in a pattern of   mild LVH. The estimated ejection fraction was 25%. Diffuse   hypokinesis. - Aortic valve: There was mild regurgitation. - Mitral valve: Post repair with ring no signficiant MR and stable   low mean gradient Valve area by continuity equation (using LVOT   flow): 0.82 cm^2. - Atrial septum: No defect or patent foramen ovale was identified. - Tricuspid valve: There was moderate regurgitation.  ------------------------------------------------------------------- Study data:  Comparison was made to the study of 09/27/2017.  Study status:  Routine.  Procedure:  Transthoracic echocardiography. Image quality was adequate.  Study completion:  There were no complications.          Transthoracic echocardiography.  M-mode, complete 2D, spectral Doppler, and color Doppler.  Birthdate: Patient birthdate: [birth year].  Age:  Patient is 52 yr old.  Sex: Gender: female.    BMI: 24.4 kg/m^2.  Blood pressure:     88/60 Patient status:  Inpatient.  Study date:  Study date: 01/22/2017. Study time: 03:16 PM.  Location:  Bedside.  -------------------------------------------------------------------  ------------------------------------------------------------------- Left ventricle:  Abnormal septal motion. The cavity size was moderately dilated. Wall thickness was increased in a pattern of mild LVH. The  estimated ejection fraction was 25%. Diffuse hypokinesis.  ------------------------------------------------------------------- Aortic valve:   Trileaflet; mildly thickened, mildly calcified leaflets.  Doppler:  There was mild regurgitation.  ------------------------------------------------------------------- Mitral valve:  Post repair with ring no signficiant MR and stable low mean gradient  Doppler:     Valve area by continuity equation (using LVOT flow): 0.82 cm^2. Indexed valve area by continuity equation (using LVOT flow): 0.51 cm^2/m^2.    Mean gradient (D): 2 mm Hg.  ------------------------------------------------------------------- Left atrium:  The atrium was normal in size.  ------------------------------------------------------------------- Atrial septum:  No defect or patent foramen ovale was identified.   ------------------------------------------------------------------- Right ventricle:  The cavity size was normal. Wall thickness was normal. Systolic function was normal.  ------------------------------------------------------------------- Pulmonic valve:    Doppler:  There was mild regurgitation.  ------------------------------------------------------------------- Tricuspid valve:   Doppler:  There was moderate regurgitation.   ------------------------------------------------------------------- Right atrium:  The atrium was normal in size.  ------------------------------------------------------------------- Pericardium:  The pericardium was normal in appearance.  ------------------------------------------------------------------- Systemic veins: Inferior vena cava: The vessel was normal  in size. The respirophasic diameter changes were in the normal range (>= 50%), consistent with normal central venous pressure.  ------------------------------------------------------------------- Measurements   Left ventricle                           Value           Reference  LV ID, ED, PLAX chordal                  43    mm       43 - 52  LV ID, ES, PLAX chordal          (H)     40    mm       23 - 38  LV fx shortening, PLAX chordal   (L)     7     %        >=29  LV PW thickness, ED                      12    mm       ----------  IVS/LV PW ratio, ED                      1.08           <=1.3  Stroke volume, 2D                        18    ml       ----------  Stroke volume/bsa, 2D                    11    ml/m^2   ----------    Ventricular septum                       Value          Reference  IVS thickness, ED                        13    mm       ----------    LVOT                                     Value          Reference  LVOT ID, S                               16    mm       ----------  LVOT area                                2.01  cm^2     ----------  LVOT peak velocity, S                    67.9  cm/s     ----------  LVOT mean velocity, S                    45.3  cm/s     ----------  LVOT VTI, S  8.88  cm       ----------  LVOT peak gradient, S                    2     mm Hg    ----------    Aortic valve                             Value          Reference  Aortic regurg pressure half-time         503   ms       ----------    Aorta                                    Value          Reference  Aortic root ID, ED                       22    mm       ----------    Left atrium                              Value          Reference  LA ID, A-P, ES                           33    mm       ----------  LA ID/bsa, A-P                           2.07  cm/m^2   <=2.2  LA volume, S                             38.8  ml       ----------  LA volume/bsa, S                         24.3  ml/m^2   ----------  LA volume, ES, 1-p A4C                   32.9  ml       ----------  LA volume/bsa, ES, 1-p A4C               20.6  ml/m^2   ----------  LA volume, ES, 1-p A2C                   41.3  ml       ----------  LA volume/bsa,  ES, 1-p A2C               25.8  ml/m^2   ----------    Mitral valve                             Value          Reference  Mitral mean velocity, D                  64.7  cm/s     ----------  Mitral mean  gradient, D                  2     mm Hg    ----------  Mitral valve area, LVOT                  0.82  cm^2     ----------  continuity  Mitral valve area/bsa, LVOT              0.51  cm^2/m^2 ----------  continuity  Mitral annulus VTI, D                    21.9  cm       ----------    Pulmonary arteries                       Value          Reference  PA pressure, S, DP                       19    mm Hg    <=30    Tricuspid valve                          Value          Reference  Tricuspid regurg peak velocity           198   cm/s     ----------  Tricuspid peak RV-RA gradient            16    mm Hg    ----------    Right atrium                             Value          Reference  RA ID, S-I, ES, A4C                      40.3  mm       34 - 49  RA area, ES, A4C                         14.7  cm^2     8.3 - 19.5  RA volume, ES, A/L                       44.1  ml       ----------  RA volume/bsa, ES, A/L                   27.6  ml/m^2   ----------    Systemic veins                           Value          Reference  Estimated CVP                            3     mm Hg    ----------    Right ventricle                          Value          Reference  TAPSE  13.5  mm       ----------  RV pressure, S, DP                       19    mm Hg    <=30  RV s&', lateral, S                        6     cm/s     ----------  Legend: (L)  and  (H)  mark values outside specified reference range.  ------------------------------------------------------------------- Prepared and Electronically Authenticated by  Charlton Haws, M.D. 2019-01-21T17:03:09   Impression:  Patient is doing very well approximately 1 year status post minimally invasive mitral valve repair  for ischemic mitral regurgitation   Plan:  We have not recommended any change the patient's current medications.  The patient will continue to follow-up with Dr. Eldridge Dace and return to our office in the future only should specific problems or questions arise.  The patient has been reminded regarding the importance of dental hygiene and the lifelong need for antibiotic prophylaxis for all dental cleanings and other related invasive procedures.   I spent in excess of 15 minutes during the conduct of this office consultation and >50% of this time involved direct face-to-face encounter with the patient for counseling and/or coordination of their care.   Salvatore Decent. Cornelius Moras, MD 08/20/2017 4:01 PM

## 2017-08-20 NOTE — Patient Instructions (Addendum)
It was great seeing you today!  Continue taking your medications as prescribed. We will be drawing labs today and will call you once we get the results. When you come in at your next visit, please bring your blood pressure log and home wrist cuff.   After your next dose of Praluent on 8/22- go to your local lab to have your labs checked (lipid panel and liver panel). Do not eat for at least 8 hours before you get your labwork done.  Follow-up in hypertension clinic on 09/25/17 at 3:30 pm

## 2017-08-20 NOTE — Telephone Encounter (Signed)
This encounter was created in error - please disregard.

## 2017-08-21 ENCOUNTER — Telehealth: Payer: Self-pay | Admitting: Pharmacist

## 2017-08-21 LAB — BASIC METABOLIC PANEL
BUN / CREAT RATIO: 11 (ref 9–23)
BUN: 8 mg/dL (ref 6–24)
CHLORIDE: 108 mmol/L — AB (ref 96–106)
CO2: 26 mmol/L (ref 20–29)
CREATININE: 0.7 mg/dL (ref 0.57–1.00)
Calcium: 9.2 mg/dL (ref 8.7–10.2)
GFR, EST AFRICAN AMERICAN: 115 mL/min/{1.73_m2} (ref >59)
GFR, EST NON AFRICAN AMERICAN: 100 mL/min/{1.73_m2} (ref >59)
Glucose: 71 mg/dL (ref 65–99)
Potassium: 3.9 mmol/L (ref 3.5–5.2)
Sodium: 146 mmol/L — ABNORMAL HIGH (ref 134–144)

## 2017-08-21 NOTE — Telephone Encounter (Signed)
Left voicemail for patient to call back regarding BMET. Her labs returned normal. She does not need a potassium supplement as her potassium is within normal range (3.9).

## 2017-08-21 NOTE — Telephone Encounter (Signed)
Spoke with pt to relay lab results, pt is aware.

## 2017-08-29 NOTE — Progress Notes (Signed)
Patient ID: Lindsay Camacho                 DOB: 24-Mar-1965                      MRN: [id number]     HPI: Lindsay Camacho is a 52 y.o. female patient of Dr. Eldridge Dace who presents today for hypertension evaluation. PMH significant for CHF with LVEF 20-25%, CAD s/p lateral wall MI in 2015 treated with PCI to the CXF, PVD, mitral valve repair, TIA, and tobacco abuse. At last visit on 8/19, patient's BP was controlled (120/76). Her home cuff readings seemed to vary greatly, and patient was advised to keep a log of her BP readings at home. Patient is coming back for earlier f/u as she passed out over the weekend at church.  Patient presents for BP management. She reported passing out on Sunday (8/25) at church. She states she was sitting at church, felt like she was going to get sick, went to the bathroom, threw up, and then passed out. She states that others noticed she was shaking and turned blue before passing out. She did have breakfast before going to church. She went to the emergency room at [facility] and her BP was 80/45. She stated that they told her that her "blood levels were high" but she's not sure what levels they were referring to. On Sunday after passing out she also stopped taking her losartan and spironolactone. She brought her wrist cuff to the clinic today, and was checked with the manual cuff. Her wrist cuff in clinic reported a BP of 121/83 with a HR of 79.  Of note, she took her 3rd dose of Praluent last week and had not been to a lab to have her lipid panel or LFTs checked. The only thing she has had this morning is 1/2 bottle of [facility].  Current HTN meds:  Losartan 12.5 mg QD Spironolactone 12.5 mg QD Carvedilol 6.25 mg BID Furosemide 40 mg QD prn edema  Previously tried:  Lisinopril 2.5 mg, 5 mg, 10 mg QD- hypotension Metoprolol tartrate 12.5 mg BID Metoprolol succinate 25 mg QD Olmesartan 20 mg QD- hypotension Entresto 24/26 mg BID- hypotension  BP goal:  <130/80  Family History:  Mother with CAD at age 74, stroke, HTN, and MI. Father with CAD at age 62, MI, and stroke.  Social History:  Former smoker 1/2 PPD, quit in 2017. Denies alcohol use. Uses marijuana.  Diet: Breakfast - oatmeal or cereal. Lunch - fruit. Dinner - potato soup, cornbread. Does not add salt to food. Does not eat fried food or fast food. Drinks low fat milk. Also drinks water and [facility].  Exercise: SOB with exercise and walking up steps.   Home BP readings: has home wrist cuff. On 8/25 and 8/24 BP were low (80-90s/40-60s). After stopping losartan and spironolactone, BP has been varying 130s/80s- 96/66 (felt weak). Her aunt has an arm cuff, and she lives with her aunt. She has compared her wrist cuff to the arm cuff with close numbers.  Wt Readings from Last 3 Encounters:  08/20/17 134 lb (60.8 kg)  06/28/17 129 lb (58.5 kg)  06/28/17 129 lb 12 oz (58.9 kg)   BP Readings from Last 3 Encounters:  08/30/17 132/84  08/20/17 97/60  08/20/17 120/76   Pulse Readings from Last 3 Encounters:  08/30/17 72  08/20/17 74  08/20/17 71    Renal function: Estimated Creatinine  Clearance: 68.8 mL/min (by C-G formula based on SCr of 0.7 mg/dL).  Past Medical History:  Diagnosis Date  . Acute myocardial infarction of other lateral wall, initial episode of care 01/2013   DES CFX  . Aortoiliac occlusive disease (HCC) 08/22/2016  . CAD (coronary artery disease) 11/13/2011   50% ? proximal LCx stenosis per Cath at [facility]  . Chest pain, atypical, muscular skelatal   01/09/2014  . Dyspnea   . Heart failure (HCC)   . HLD (hyperlipidemia)   . Hypertension   . Intermediate coronary syndrome (HCC)   . LBBB (left bundle branch block)   . Mitral regurgitation   . Nonischemic cardiomyopathy (HCC) 01/26/2017  . PVD (peripheral vascular disease) (HCC)    Right CFA stenosis per report on 11/14/2011  . 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/25/2016   Edwards McArthy-Adams IMR  ETLogix ring annuloplasty (Model 4100, Serial [id number], size 26) placed via right mini thoracotomy approach  . Stenosis of left subclavian artery (HCC) 08/22/2016  . Stroke [facility])    tia with no deficits    Current Outpatient Medications on File Prior to Visit  Medication Sig Dispense Refill  . acetaminophen (TYLENOL) 500 MG tablet Take 1,000 mg by mouth every 6 (six) hours as needed for headache (pain).    . Alirocumab (PRALUENT) 75 MG/ML SOPN Inject 1 pen into the skin every 14 (fourteen) days. 2 pen 11  . aspirin EC 81 MG tablet Take 81 mg by mouth daily.    Marland Kitchen atorvastatin (LIPITOR) 80 MG tablet Take 1 tablet (80 mg total) by mouth daily. (Patient taking differently: Take 80 mg by mouth at bedtime. ) 30 tablet 11  . carvedilol (COREG) 6.25 MG tablet Take 6.25 mg by mouth 2 (two) times daily with a meal.    . clopidogrel (PLAVIX) 75 MG tablet Take 1 tablet (75 mg total) by mouth daily with breakfast. 30 tablet 11  . ezetimibe (ZETIA) 10 MG tablet Take 1 tablet (10 mg total) by mouth daily. 90 tablet 3  . furosemide (LASIX) 40 MG tablet Take 40 mg by mouth daily as needed for fluid or edema (shortness of breath).     . losartan (COZAAR) 25 MG tablet Take 0.5 tablets (12.5 mg total) by mouth daily. 15 tablet 11   No current facility-administered medications on file prior to visit.     Allergies  Allergen Reactions  . Aldomet [Methyldopa] Other (See Comments)    Severe hypotension  . Brilinta [Ticagrelor] Shortness Of Breath  . Other Other (See Comments)    Nuclear Stress Test Medication caused seizures  . Coumadin [Warfarin Sodium] Swelling    Arm swelled  . Penicillins Rash    Has patient had a PCN reaction causing immediate rash, facial/tongue/throat swelling, SOB or lightheadedness with hypotension: Yes Has patient had a PCN reaction causing severe rash involving mucus membranes or skin necrosis: No Has patient had a PCN reaction that required hospitalization: No Has patient had a  PCN reaction occurring within the last 10 years: No If all of the above answers are "NO", then may proceed with Cephalosporin use.     Blood pressure 132/84, pulse 72, SpO2 98 %.   Assessment/Plan: Hypertension: BP in clinic currently above goal (<130/80). Due to patients hypotension and passing out over the weekend, spironolactone was discontinued. The benefits of losartan with heart failure were discussed with the patient which she understood. Patient was informed to call the HTN clinic if she is still seeing numbers  below 100/60. BMET was ordered to rule out electrolyte abnormalities.  Hyperlipidemia: Patient has now taken 3rd dose of Praluent. Lipid panel and LFTs ordered.  Will f/u on labs with patient after they return. Follow-up in HTN clinic on 10/01/17 to assess BP, hypotension, and medication optimization.  Thank you, Freddie Apley. Cleatis Polka, PharmD  [facility]  08/30/2017 10:17 AM  Patient seen with Thomes Cake, PharmD Candidate

## 2017-08-30 ENCOUNTER — Ambulatory Visit (INDEPENDENT_AMBULATORY_CARE_PROVIDER_SITE_OTHER): Payer: Medicaid - Out of State | Admitting: Pharmacist

## 2017-08-30 VITALS — BP 132/84 | HR 72

## 2017-08-30 DIAGNOSIS — E785 Hyperlipidemia, unspecified: Secondary | ICD-10-CM

## 2017-08-30 DIAGNOSIS — I10 Essential (primary) hypertension: Secondary | ICD-10-CM | POA: Diagnosis not present

## 2017-08-30 LAB — BASIC METABOLIC PANEL
BUN/Creatinine Ratio: 6 — ABNORMAL LOW (ref 9–23)
BUN: 5 mg/dL — ABNORMAL LOW (ref 6–24)
CO2: 24 mmol/L (ref 20–29)
Calcium: 8.6 mg/dL — ABNORMAL LOW (ref 8.7–10.2)
Chloride: 109 mmol/L — ABNORMAL HIGH (ref 96–106)
Creatinine, Ser: 0.79 mg/dL (ref 0.57–1.00)
GFR calc Af Amer: 100 mL/min/{1.73_m2} (ref >59)
GFR calc non Af Amer: 86 mL/min/{1.73_m2} (ref >59)
Glucose: 68 mg/dL (ref 65–99)
Potassium: 3.9 mmol/L (ref 3.5–5.2)
Sodium: 149 mmol/L — ABNORMAL HIGH (ref 134–144)

## 2017-08-30 LAB — HEPATIC FUNCTION PANEL
ALBUMIN: 3.7 g/dL (ref 3.5–5.5)
ALK PHOS: 93 IU/L (ref 39–117)
ALT: 20 IU/L (ref 0–32)
AST: 21 IU/L (ref 0–40)
BILIRUBIN TOTAL: 0.5 mg/dL (ref 0.0–1.2)
Bilirubin, Direct: 0.2 mg/dL (ref 0.00–0.40)
TOTAL PROTEIN: 6.2 g/dL (ref 6.0–8.5)

## 2017-08-30 LAB — LIPID PANEL
Chol/HDL Ratio: 2 ratio (ref 0.0–4.4)
Cholesterol, Total: 56 mg/dL — ABNORMAL LOW (ref 100–199)
HDL: 28 mg/dL — ABNORMAL LOW (ref >39)
LDL Calculated: 13 mg/dL (ref 0–99)
Triglycerides: 75 mg/dL (ref 0–149)
VLDL Cholesterol Cal: 15 mg/dL (ref 5–40)

## 2017-08-30 NOTE — Patient Instructions (Addendum)
It was great seeing you today!  For your blood pressure: STOP taking the spironolactone 12.5 mg daily Continue taking losartan and carvedilol as prescribed.  We will give you a call when your labs return tomorrow.  Continue monitoring your blood pressure at home. If you see many numbers <100/60, please give the clinic a call at [phone].

## 2017-08-31 ENCOUNTER — Telehealth: Payer: Self-pay | Admitting: Pharmacist

## 2017-08-31 NOTE — Telephone Encounter (Signed)
Called patient to inform her about lab results. Her LDL is now 13, and informed patient to continue Praluent injections. BMET showed elevated sodium and chloride which could be due to dehydration. Advised patient to try to increase her water intake.

## 2017-09-25 ENCOUNTER — Ambulatory Visit: Payer: Self-pay

## 2017-09-30 NOTE — Progress Notes (Signed)
Patient ID: SHARRON FLORY                 DOB: July 12, 1965                      MRN: [id number]     HPI: Lindsay Camacho is a 52 y.o. female patient of Dr. Eldridge Dace who presents today for hypertension evaluation. PMH significant for CHF with LVEF 20-25%, CAD s/p lateral wall MI in 2015 treated with PCI to the CXF, PVD, mitral valve repair, TIA, and tobacco abuse. At last clinic visit, spironolactone was discontinued secondary to hypotensive episode after passing out at church.    Patient presents today for BP management. She reports nosyncopal episodes, blurry vision, lightheadedness or dizziness. She reports her home blood pressure has been ~117/66 mmHg, HR 79 since discontinuing her spironolactone. She has not seen any systolic numbers < 100 mmHg. She brought her wrist cuff to the clinic at last visit and accuracy was verified.  She reports no issues with Praluent injections.  Current HTN meds:  Losartan 12.5 mg QD (evening) Carvedilol 6.25 mg BID  Furosemide 40 mg QD prn edema (hasn't needed in the last month)  Previously tried:  Spironolactone 12.5 mg QD - discontinued after episode of hypotension Lisinopril 2.5 mg, 5 mg, 10 mg QD- hypotension Metoprolol tartrate 12.5 mg BID Metoprolol succinate 25 mg QD Olmesartan 20 mg QD- hypotension Entresto 24/26 mg BID- hypotension  BP goal: <130/80  Family History:  Mother with CAD at age 13, stroke, HTN, and MI. Father with CAD at age 85, MI, and stroke.  Social History:  Former smoker 1/2 PPD, quit in 2017. Denies alcohol use. Uses marijuana.  Diet: Breakfast - oatmeal or cereal. Lunch - fruit. Dinner - potato soup, cornbread. Does not add salt to food. Does not eat fried food or fast food. Drinks low fat milk. Also drinks water and [facility].  Exercise: Reporting shopping more/walking around. Went to [facility] last weekend and reports walking for three hours. SOB with exercise and walking up steps/walking up hill.   Home BP  readings: Since stopping spironolactone, reports no low blood pressures. Estimates average ~117/66 mmHg, HR 79.    Wt Readings from Last 3 Encounters:  08/20/17 134 lb (60.8 kg)  06/28/17 129 lb (58.5 kg)  06/28/17 129 lb 12 oz (58.9 kg)   BP Readings from Last 3 Encounters:  10/01/17 105/68  08/30/17 132/84  08/20/17 97/60   Pulse Readings from Last 3 Encounters:  10/01/17 81  08/30/17 72  08/20/17 74   Labs: 08/30/17: Total 56, HDL 28, LDL 13, TG 75 (on praluent) 05/10/17: Total 139, HDL 29, LDL 81, TG 144 01/23/17: Total 158, HDL 31, LDL 102, TG 126 04/04/16: Total 274, HDL 42, LDL 213, TG 96  Renal function: CrCl cannot be calculated (Patient's most recent lab result is older than the maximum 21 days allowed.).  Past Medical History:  Diagnosis Date  . Acute myocardial infarction of other lateral wall, initial episode of care 01/2013   DES CFX  . Aortoiliac occlusive disease (HCC) 08/22/2016  . CAD (coronary artery disease) 11/13/2011   50% ? proximal LCx stenosis per Cath at [facility]  . Chest pain, atypical, muscular skelatal   01/09/2014  . Dyspnea   . Heart failure (HCC)   . HLD (hyperlipidemia)   . Hypertension   . Intermediate coronary syndrome (HCC)   . LBBB (left bundle branch block)   . Mitral  regurgitation   . Nonischemic cardiomyopathy (HCC) 01/26/2017  . PVD (peripheral vascular disease) (HCC)    Right CFA stenosis per report on 11/14/2011  . 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/25/2016   Edwards McArthy-Adams IMR ETLogix ring annuloplasty (Model 4100, Serial [id number], size 26) placed via right mini thoracotomy approach  . Stenosis of left subclavian artery (HCC) 08/22/2016  . Stroke [facility])    tia with no deficits    Current Outpatient Medications on File Prior to Visit  Medication Sig Dispense Refill  . acetaminophen (TYLENOL) 500 MG tablet Take 1,000 mg by mouth every 6 (six) hours as needed for headache (pain).    . Alirocumab (PRALUENT) 75 MG/ML SOPN  Inject 1 pen into the skin every 14 (fourteen) days. 2 pen 11  . aspirin EC 81 MG tablet Take 81 mg by mouth daily.    Marland Kitchen atorvastatin (LIPITOR) 80 MG tablet Take 1 tablet (80 mg total) by mouth daily. (Patient taking differently: Take 80 mg by mouth at bedtime. ) 30 tablet 11  . carvedilol (COREG) 6.25 MG tablet Take 6.25 mg by mouth 2 (two) times daily with a meal.    . clopidogrel (PLAVIX) 75 MG tablet Take 1 tablet (75 mg total) by mouth daily with breakfast. 30 tablet 11  . ezetimibe (ZETIA) 10 MG tablet Take 1 tablet (10 mg total) by mouth daily. 90 tablet 3  . furosemide (LASIX) 40 MG tablet Take 40 mg by mouth daily as needed for fluid or edema (shortness of breath).     . losartan (COZAAR) 25 MG tablet Take 0.5 tablets (12.5 mg total) by mouth daily. 15 tablet 11   No current facility-administered medications on file prior to visit.     Allergies  Allergen Reactions  . Aldomet [Methyldopa] Other (See Comments)    Severe hypotension  . Brilinta [Ticagrelor] Shortness Of Breath  . Other Other (See Comments)    Nuclear Stress Test Medication caused seizures  . Coumadin [Warfarin Sodium] Swelling    Arm swelled  . Penicillins Rash    Has patient had a PCN reaction causing immediate rash, facial/tongue/throat swelling, SOB or lightheadedness with hypotension: Yes Has patient had a PCN reaction causing severe rash involving mucus membranes or skin necrosis: No Has patient had a PCN reaction that required hospitalization: No Has patient had a PCN reaction occurring within the last 10 years: No If all of the above answers are "NO", then may proceed with Cephalosporin use.     Blood pressure 105/68, pulse 81, SpO2 98 %.   Assessment/Plan:  Hypertension: BP in clinic at goal (<130/80) on losartan and carvedilol. Will not titrate any medications given SBP in clinic 105 mmHg. Will not discontinue either therapy due to mortality benefit in HFrEF. BMET was ordered to check electrolytes  after discontinuing her spironolactone. Patient was informed to call the HTN clinic if she is having any dizziness, headaches, syncopal episodes. Can follow up in HTN clinic as needed.  Hyperlipidemia: Patient tolerating Praluent well. Follow up lipid panel shows fantastic response to medication with LDL reduction from 81 to 13. Continue current therapy.   Marcelino Freestone, PharmD PGY2 Cardiology Pharmacy Resident 10/01/2017 4:16 PM

## 2017-10-01 ENCOUNTER — Ambulatory Visit (INDEPENDENT_AMBULATORY_CARE_PROVIDER_SITE_OTHER): Payer: Medicaid - Out of State | Admitting: Pharmacist

## 2017-10-01 VITALS — BP 105/68 | HR 81

## 2017-10-01 DIAGNOSIS — E785 Hyperlipidemia, unspecified: Secondary | ICD-10-CM | POA: Diagnosis not present

## 2017-10-01 DIAGNOSIS — I10 Essential (primary) hypertension: Secondary | ICD-10-CM | POA: Diagnosis not present

## 2017-10-01 NOTE — Patient Instructions (Addendum)
It was great to see you today.  Continue taking your losartan 12.5 mg every evening and carvedilol 6.25 mg twice daily.  If you have any questions or concerns, please feel free to contact us [phone].

## 2017-10-02 ENCOUNTER — Telehealth: Payer: Self-pay

## 2017-10-02 LAB — BASIC METABOLIC PANEL
BUN/Creatinine Ratio: 8 — ABNORMAL LOW (ref 9–23)
BUN: 6 mg/dL (ref 6–24)
CALCIUM: 9.1 mg/dL (ref 8.7–10.2)
CO2: 24 mmol/L (ref 20–29)
CREATININE: 0.8 mg/dL (ref 0.57–1.00)
Chloride: 105 mmol/L (ref 96–106)
GFR, EST AFRICAN AMERICAN: 98 mL/min/{1.73_m2} (ref >59)
GFR, EST NON AFRICAN AMERICAN: 85 mL/min/{1.73_m2} (ref >59)
Glucose: 76 mg/dL (ref 65–99)
Potassium: 3.9 mmol/L (ref 3.5–5.2)
Sodium: 141 mmol/L (ref 134–144)

## 2017-10-02 MED ORDER — ALIROCUMAB 75 MG/ML ~~LOC~~ SOPN: 1.0000 "pen " | PEN_INJECTOR | SUBCUTANEOUS | 11 refills | Status: DC

## 2017-10-02 NOTE — Telephone Encounter (Signed)
Pt called asking to speak w/ [name] regarding her Praluent rx.  Asked if she needed a refill, but she stated that she does and that "she has to do something w/ the insurance". Advised her that I will make [name] aware and have her call back w/ any questions.

## 2017-10-02 NOTE — Telephone Encounter (Signed)
Pt has new [facility] with Medicare: ID: [id number] GRP [id number] BIN [id number]  PA submitted, not needed due to being available without PA. She will not be able to use [facility] specialty pharmacy anymore since she does not have [employer]. Rx sent to specialty [facility] pharmacy and pt was provided with their phone #. She will call with any concerns.

## 2017-10-29 ENCOUNTER — Ambulatory Visit (INDEPENDENT_AMBULATORY_CARE_PROVIDER_SITE_OTHER): Payer: Medicare Other | Admitting: *Deleted

## 2017-10-29 DIAGNOSIS — I5022 Chronic systolic (congestive) heart failure: Secondary | ICD-10-CM

## 2017-10-29 DIAGNOSIS — I428 Other cardiomyopathies: Secondary | ICD-10-CM | POA: Diagnosis not present

## 2017-10-29 NOTE — Progress Notes (Signed)
Remote ICD transmission.   

## 2017-11-26 ENCOUNTER — Other Ambulatory Visit: Payer: Self-pay | Admitting: Cardiology

## 2017-12-14 ENCOUNTER — Telehealth: Payer: Self-pay | Admitting: Pharmacist

## 2017-12-14 NOTE — Telephone Encounter (Signed)
Called pt-let her know that praluent PA approved through 01/02/2019

## 2017-12-23 LAB — CUP PACEART REMOTE DEVICE CHECK
Battery Remaining Longevity: 89 mo
Battery Voltage: 2.99 V
Brady Statistic AP VP Percent: 0.03 %
Brady Statistic AP VS Percent: 0.01 %
Brady Statistic AS VP Percent: 99.29 %
Brady Statistic AS VS Percent: 0.67 %
Brady Statistic RA Percent Paced: 0.04 %
HighPow Impedance: 74 Ohm
Implantable Lead Implant Date: 20190124
Implantable Lead Implant Date: 20190124
Implantable Lead Implant Date: 20190124
Implantable Lead Location: 753859
Implantable Lead Location: 753860
Implantable Lead Model: 3830
Implantable Lead Model: 5076
Implantable Lead Model: 6935
Lead Channel Impedance Value: 399 Ohm
Lead Channel Impedance Value: 456 Ohm
Lead Channel Impedance Value: 456 Ohm
Lead Channel Impedance Value: 589 Ohm
Lead Channel Pacing Threshold Amplitude: 0.625 V
Lead Channel Pacing Threshold Amplitude: 1 V
Lead Channel Pacing Threshold Pulse Width: 0.4 ms
Lead Channel Pacing Threshold Pulse Width: 0.5 ms
Lead Channel Sensing Intrinsic Amplitude: 1.875 mV
Lead Channel Sensing Intrinsic Amplitude: 9.875 mV
Lead Channel Sensing Intrinsic Amplitude: 9.875 mV
Lead Channel Setting Pacing Amplitude: 1.25 V
Lead Channel Setting Pacing Amplitude: 2.5 V
Lead Channel Setting Pacing Pulse Width: 0.4 ms
Lead Channel Setting Pacing Pulse Width: 0.5 ms
Lead Channel Setting Sensing Sensitivity: 0.3 mV
MDC IDC LEAD LOCATION: 753860
MDC IDC MSMT LEADCHNL LV IMPEDANCE VALUE: 209 Ohm
MDC IDC MSMT LEADCHNL LV IMPEDANCE VALUE: 323 Ohm
MDC IDC MSMT LEADCHNL RA PACING THRESHOLD AMPLITUDE: 0.875 V
MDC IDC MSMT LEADCHNL RA PACING THRESHOLD PULSEWIDTH: 0.4 ms
MDC IDC MSMT LEADCHNL RA SENSING INTR AMPL: 1.875 mV
MDC IDC PG IMPLANT DT: 20190124
MDC IDC SESS DTM: 20191028041805
MDC IDC SET LEADCHNL RA PACING AMPLITUDE: 1.75 V
MDC IDC STAT BRADY RV PERCENT PACED: 99.11 %

## 2018-01-01 ENCOUNTER — Ambulatory Visit: Payer: Self-pay | Admitting: Interventional Cardiology

## 2018-01-23 ENCOUNTER — Encounter: Payer: Self-pay | Admitting: Internal Medicine

## 2018-01-28 ENCOUNTER — Ambulatory Visit (INDEPENDENT_AMBULATORY_CARE_PROVIDER_SITE_OTHER): Payer: Medicare Other

## 2018-01-28 DIAGNOSIS — I5022 Chronic systolic (congestive) heart failure: Secondary | ICD-10-CM

## 2018-01-28 DIAGNOSIS — I428 Other cardiomyopathies: Secondary | ICD-10-CM | POA: Diagnosis not present

## 2018-01-30 NOTE — Progress Notes (Signed)
Remote ICD transmission.   

## 2018-01-31 LAB — CUP PACEART REMOTE DEVICE CHECK
Battery Remaining Longevity: 85 mo
Battery Voltage: 2.98 V
Brady Statistic AP VP Percent: 0.04 %
Brady Statistic AP VS Percent: 0.01 %
Brady Statistic AS VP Percent: 99.52 %
Brady Statistic AS VS Percent: 0.43 %
Brady Statistic RA Percent Paced: 0.05 %
Brady Statistic RV Percent Paced: 99.35 %
Date Time Interrogation Session: 20200127093823
HighPow Impedance: 82 Ohm
Implantable Lead Implant Date: 20190124
Implantable Lead Implant Date: 20190124
Implantable Lead Implant Date: 20190124
Implantable Lead Location: 753859
Implantable Lead Location: 753860
Implantable Lead Location: 753860
Implantable Lead Model: 5076
Implantable Pulse Generator Implant Date: 20190124
Lead Channel Impedance Value: 304 Ohm
Lead Channel Impedance Value: 399 Ohm
Lead Channel Impedance Value: 437 Ohm
Lead Channel Impedance Value: 437 Ohm
Lead Channel Impedance Value: 532 Ohm
Lead Channel Pacing Threshold Amplitude: 0.625 V
Lead Channel Pacing Threshold Amplitude: 0.625 V
Lead Channel Pacing Threshold Amplitude: 1.125 V
Lead Channel Pacing Threshold Pulse Width: 0.4 ms
Lead Channel Pacing Threshold Pulse Width: 0.4 ms
Lead Channel Pacing Threshold Pulse Width: 0.5 ms
Lead Channel Sensing Intrinsic Amplitude: 1.75 mV
Lead Channel Sensing Intrinsic Amplitude: 1.75 mV
Lead Channel Sensing Intrinsic Amplitude: 9.25 mV
Lead Channel Setting Pacing Amplitude: 1.25 V
Lead Channel Setting Pacing Amplitude: 1.5 V
Lead Channel Setting Pacing Amplitude: 2.5 V
Lead Channel Setting Pacing Pulse Width: 0.4 ms
Lead Channel Setting Pacing Pulse Width: 0.5 ms
Lead Channel Setting Sensing Sensitivity: 0.3 mV
MDC IDC MSMT LEADCHNL LV IMPEDANCE VALUE: 209 Ohm
MDC IDC MSMT LEADCHNL RV SENSING INTR AMPL: 9.25 mV

## 2018-02-05 ENCOUNTER — Ambulatory Visit: Payer: Medicaid - Out of State | Admitting: Interventional Cardiology

## 2018-02-05 NOTE — Progress Notes (Deleted)
Cardiology Office Note   Date:  02/05/2018   ID:  Lindsay, Camacho 06-26-1965, MRN [id number]  PCP:  Montez Hageman, DO    No chief complaint on file.  CAD  Wt Readings from Last 3 Encounters:  08/20/17 134 lb (60.8 kg)  06/28/17 129 lb (58.5 kg)  06/28/17 129 lb 12 oz (58.9 kg)       History of Present Illness: Lindsay Camacho is a 53 y.o. female  with ah/o CAD s/plateral wall MI in 2015treated with PCI to the CXF. She had a repeat cath in 09/2014 showing patent stent. She unfortunately had a RP bleed post cath and required vascular surgery. She had another R/LHC prior to valve surgery 08/21/16 that showed patent CFX stent with minal restenosis + mild nonobstructive CAD in the RCA and LAD. She also has h/o chronic systolic HF w/ EF of 35-40%, severe MR and tobacco abuse.   She underwent minimally invasive mitral valve repair, per Dr. Cornelius Moras, for severe symptomatic MR. Surgery was 08/25/16.  [place] with worsening SOB r/t A/C systolic heart failure. She was diuresed with IV lasix and was transitioned to PO. She was transferred on 1/20 for ICD evaluation after an echo showed worsening EF of 20-25% (35-40% 09/2016). She did not require further diuresis.SuccessfulMedtronicICD implantation on 1/23/19but unsuccessful LV leaddue to anatomy. Has HIS and RV lead. Interrogation and CXR normal postop. Complication of hematoma with moderate pain at insertion site, so she was kept an additional night for observation. Only able to tolerate low dose BB due to hypotension. Lasix only as needed.  Chronic Atypical chest pain at the site of her AICD and under her left arm.  Not like prior MI pain.   Losartan was added in June 2019 with the intent to add spironolactone if BP tolerated, all to help LV dysfunction.   Past Medical History:  Diagnosis Date  . Acute myocardial infarction of other lateral wall, initial episode of care 01/2013   DES CFX  .  Aortoiliac occlusive disease (HCC) 08/22/2016  . CAD (coronary artery disease) 11/13/2011   50% ? proximal LCx stenosis per Cath at [facility]  . Chest pain, atypical, muscular skelatal   01/09/2014  . Dyspnea   . Heart failure (HCC)   . HLD (hyperlipidemia)   . Hypertension   . Intermediate coronary syndrome (HCC)   . LBBB (left bundle branch block)   . Mitral regurgitation   . Nonischemic cardiomyopathy (HCC) 01/26/2017  . PVD (peripheral vascular disease) (HCC)    Right CFA stenosis per report on 11/14/2011  . 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/25/2016   Edwards McArthy-Adams IMR ETLogix ring annuloplasty (Model 4100, Serial [id number], size 26) placed via right mini thoracotomy approach  . Stenosis of left subclavian artery (HCC) 08/22/2016  . Stroke [facility])    tia with no deficits    Past Surgical History:  Procedure Laterality Date  . APPENDECTOMY    . BIV ICD INSERTION CRT-D N/A 01/24/2017   Procedure: BIV ICD INSERTION CRT-D;  Surgeon: Marinus Maw, MD;  Location: [facility] INVASIVE CV LAB;  Service: Cardiovascular;  Laterality: N/A;  . CARDIAC CATHETERIZATION  04/14/2013   Non-obstructive disease, patent CFX stent  . CARDIAC CATHETERIZATION N/A 09/21/2014   Procedure: Left Heart Cath and Coronary Angiography;  Surgeon: Lennette Bihari, MD;  Location: [facility] INVASIVE CV LAB;  Service: Cardiovascular;  Laterality: N/A;  . CORONARY ANGIOPLASTY WITH STENT PLACEMENT  01/17/2013   mild disease except 99% CFX, rx with  2.5 x 28 Alpine drug-eluting stent   . GROIN DEBRIDEMENT Right 04/14/2013   Procedure: Emergency Evacuation of Retroperitoneal Hematoma and Repair Right External Iliac Artery Pseudoaneurysm    ;  Surgeon: Sherren Kerns, MD;  Location: [facility] OR;  Service: Vascular;  Laterality: Right;  . LEFT HEART CATHETERIZATION WITH CORONARY ANGIOGRAM N/A 01/18/2013   Procedure: LEFT HEART CATHETERIZATION WITH CORONARY ANGIOGRAM;  Surgeon: Corky Crafts, MD;  Location: [facility] CATH LAB;  Service:  Cardiovascular;  Laterality: N/A;  . LEFT HEART CATHETERIZATION WITH CORONARY ANGIOGRAM N/A 04/14/2013   Procedure: LEFT HEART CATHETERIZATION WITH CORONARY ANGIOGRAM;  Surgeon: Corky Crafts, MD;  Location: [facility] CATH LAB;  Service: Cardiovascular;  Laterality: N/A;  . MITRAL VALVE REPAIR Right 08/25/2016   Procedure: MINIMALLY INVASIVE MITRAL VALVE REPAIR;  Surgeon: Purcell Nails, MD;  Location: [facility] OR;  Service: Open Heart Surgery;  Laterality: Right;  . MULTIPLE EXTRACTIONS WITH ALVEOLOPLASTY N/A 08/23/2016   Procedure: Extraction of tooth #'s 17, 20-23, 26-29 with alveoloplasty;  Surgeon: [name], DDS;  Location: [facility] OR;  Service: Oral Surgery;  Laterality: N/A;  . PERCUTANEOUS CORONARY STENT INTERVENTION (PCI-S)  01/18/2013   Procedure: PERCUTANEOUS CORONARY STENT INTERVENTION (PCI-S);  Surgeon: Corky Crafts, MD;  Location: [facility] CATH LAB;  Service: Cardiovascular;;  . RIGHT/LEFT HEART CATH AND CORONARY ANGIOGRAPHY N/A 08/21/2016   Procedure: RIGHT/LEFT HEART CATH AND CORONARY ANGIOGRAPHY;  Surgeon: Kathleene Hazel, MD;  Location: [facility] INVASIVE CV LAB;  Service: Cardiovascular;  Laterality: N/A;  . TEE WITHOUT CARDIOVERSION N/A 03/06/2014   Procedure: TRANSESOPHAGEAL ECHOCARDIOGRAM (TEE);  Surgeon: Lars Masson, MD;  Location: [facility] ENDOSCOPY;  Service: Cardiovascular;  Laterality: N/A;  . TEE WITHOUT CARDIOVERSION N/A 08/18/2016   Procedure: TRANSESOPHAGEAL ECHOCARDIOGRAM (TEE);  Surgeon: Lewayne Bunting, MD;  Location: [facility] ENDOSCOPY;  Service: Cardiovascular;  Laterality: N/A;  . TEE WITHOUT CARDIOVERSION N/A 08/25/2016   Procedure: TRANSESOPHAGEAL ECHOCARDIOGRAM (TEE);  Surgeon: Purcell Nails, MD;  Location: [facility] OR;  Service: Open Heart Surgery;  Laterality: N/A;  . TUBAL LIGATION       Current Outpatient Medications  Medication Sig Dispense Refill  . acetaminophen (TYLENOL) 500 MG tablet Take 1,000 mg by mouth every 6 (six) hours as needed for headache (pain).      . Alirocumab (PRALUENT) 75 MG/ML SOPN Inject 1 pen into the skin every 14 (fourteen) days. 2 pen 11  . aspirin EC 81 MG tablet Take 81 mg by mouth daily.    Marland Kitchen atorvastatin (LIPITOR) 80 MG tablet TAKE 1 TABLET BY MOUTH EVERY DAY 30 tablet 10  . carvedilol (COREG) 6.25 MG tablet Take 6.25 mg by mouth 2 (two) times daily with a meal.    . clopidogrel (PLAVIX) 75 MG tablet Take 1 tablet (75 mg total) by mouth daily with breakfast. 30 tablet 11  . ezetimibe (ZETIA) 10 MG tablet Take 1 tablet (10 mg total) by mouth daily. 90 tablet 3  . furosemide (LASIX) 40 MG tablet Take 40 mg by mouth daily as needed for fluid or edema (shortness of breath).     . losartan (COZAAR) 25 MG tablet Take 0.5 tablets (12.5 mg total) by mouth daily. 15 tablet 11   No current facility-administered medications for this visit.     Allergies:   Aldomet [methyldopa]; Brilinta [ticagrelor]; Other; Coumadin [warfarin sodium]; and Penicillins    Social History:  The patient  reports that she quit smoking about  2 years ago. Her smoking use included cigarettes. She smoked 0.50 packs per day. She has never used smokeless tobacco. She reports current drug use. Drug: Marijuana. She reports that she does not drink alcohol.   Family History:  The patient's ***family history includes Bladder Cancer in her mother; CAD (age of onset: 2454) in her mother; CAD (age of onset: 5265) in her father; Heart attack in her father and mother; Heart disease in her father and mother; Hypertension in her mother; Lung cancer in her mother; Stroke in her father and mother.    ROS:  Please see the history of present illness.   Otherwise, review of systems are positive for ***.   All other systems are reviewed and negative.    PHYSICAL EXAM: VS:  There were no vitals taken for this visit. , BMI There is no height or weight on file to calculate BMI. GEN: Well nourished, well developed, in no acute distress  HEENT: normal  Neck: no JVD, carotid bruits, or  masses Cardiac: ***RRR; no murmurs, rubs, or gallops,no edema  Respiratory:  clear to auscultation bilaterally, normal work of breathing GI: soft, nontender, nondistended, + BS MS: no deformity or atrophy  Skin: warm and dry, no rash Neuro:  Strength and sensation are intact Psych: euthymic mood, full affect   EKG:   The ekg ordered today demonstrates ***   Recent Labs: 08/30/2017: ALT 20 10/01/2017: BUN 6; Creatinine, Ser 0.80; Potassium 3.9; Sodium 141   Lipid Panel    Component Value Date/Time   CHOL 56 (L) 08/30/2017 0945   TRIG 75 08/30/2017 0945   HDL 28 (L) 08/30/2017 0945   CHOLHDL 2.0 08/30/2017 0945   CHOLHDL 5.1 01/23/2017 0420   VLDL 25 01/23/2017 0420   LDLCALC 13 08/30/2017 0945     Other studies Reviewed: Additional studies/ records that were reviewed today with results demonstrating: ***.   ASSESSMENT AND PLAN:  1. CAD/Old MI:  2. MV repair 3. Chronic systolic heart failure: 4. Hyperlipidemia:   Current medicines are reviewed at length with the patient today.  The patient concerns regarding her medicines were addressed.  The following changes have been made:  No change***  Labs/ tests ordered today include: *** No orders of the defined types were placed in this encounter.   Recommend 150 minutes/week of aerobic exercise Low fat, low carb, high fiber diet recommended  Disposition:   FU in ***   Signed, Lance MussJayadeep Cherine Drumgoole, MD  02/05/2018 8:21 AM    [facility] [address] [address], [place], [place]  [address] Phone: [phone]; Fax: [phone]

## 2018-02-06 ENCOUNTER — Encounter: Payer: Self-pay | Admitting: Interventional Cardiology

## 2018-02-15 ENCOUNTER — Encounter: Payer: Self-pay | Admitting: Internal Medicine

## 2018-02-15 ENCOUNTER — Ambulatory Visit (INDEPENDENT_AMBULATORY_CARE_PROVIDER_SITE_OTHER): Payer: Medicare Other | Admitting: Internal Medicine

## 2018-02-15 VITALS — BP 124/70 | HR 65 | Ht 61.0 in | Wt 125.0 lb

## 2018-02-15 DIAGNOSIS — I5022 Chronic systolic (congestive) heart failure: Secondary | ICD-10-CM | POA: Diagnosis not present

## 2018-02-15 DIAGNOSIS — I25118 Atherosclerotic heart disease of native coronary artery with other forms of angina pectoris: Secondary | ICD-10-CM | POA: Diagnosis not present

## 2018-02-15 DIAGNOSIS — Z95810 Presence of automatic (implantable) cardiac defibrillator: Secondary | ICD-10-CM | POA: Diagnosis not present

## 2018-02-15 DIAGNOSIS — Z9889 Other specified postprocedural states: Secondary | ICD-10-CM

## 2018-02-15 NOTE — Progress Notes (Signed)
HPI Ms. Eckman returns today for followup of her chronic systolic heart failure, LBBB, s/p MV repair, s/p biv ICD insertion. She has stopped smoking. She denies anginal symptoms. No syncope or ICD shock. No edema.  Allergies  Allergen Reactions  . Aldomet [Methyldopa] Other (See Comments)    Severe hypotension  . Brilinta [Ticagrelor] Shortness Of Breath  . Other Other (See Comments)    Nuclear Stress Test Medication caused seizures  . Coumadin [Warfarin Sodium] Swelling    Arm swelled  . Penicillins Rash    Has patient had a PCN reaction causing immediate rash, facial/tongue/throat swelling, SOB or lightheadedness with hypotension: Yes Has patient had a PCN reaction causing severe rash involving mucus membranes or skin necrosis: No Has patient had a PCN reaction that required hospitalization: No Has patient had a PCN reaction occurring within the last 10 years: No If all of the above answers are "NO", then may proceed with Cephalosporin use.      Current Outpatient Medications  Medication Sig Dispense Refill  . acetaminophen (TYLENOL) 500 MG tablet Take 1,000 mg by mouth every 6 (six) hours as needed for headache (pain).    . Alirocumab (PRALUENT) 75 MG/ML SOPN Inject 1 pen into the skin every 14 (fourteen) days. 2 pen 11  . aspirin EC 81 MG tablet Take 81 mg by mouth daily.    Marland Kitchen atorvastatin (LIPITOR) 80 MG tablet TAKE 1 TABLET BY MOUTH EVERY DAY 30 tablet 10  . carvedilol (COREG) 6.25 MG tablet Take 6.25 mg by mouth 2 (two) times daily with a meal.    . clopidogrel (PLAVIX) 75 MG tablet Take 1 tablet (75 mg total) by mouth daily with breakfast. 30 tablet 11  . ezetimibe (ZETIA) 10 MG tablet Take 1 tablet (10 mg total) by mouth daily. 90 tablet 3  . furosemide (LASIX) 40 MG tablet Take 40 mg by mouth daily as needed for fluid or edema (shortness of breath).     . losartan (COZAAR) 25 MG tablet Take 0.5 tablets (12.5 mg total) by mouth daily. 15 tablet 11   No current  facility-administered medications for this visit.      Past Medical History:  Diagnosis Date  . Acute myocardial infarction of other lateral wall, initial episode of care 01/2013   DES CFX  . Aortoiliac occlusive disease (HCC) 08/22/2016  . CAD (coronary artery disease) 11/13/2011   50% ? proximal LCx stenosis per Cath at [facility]  . Chest pain, atypical, muscular skelatal   01/09/2014  . Dyspnea   . Heart failure (HCC)   . HLD (hyperlipidemia)   . Hypertension   . Intermediate coronary syndrome (HCC)   . LBBB (left bundle branch block)   . Mitral regurgitation   . Nonischemic cardiomyopathy (HCC) 01/26/2017  . PVD (peripheral vascular disease) (HCC)    Right CFA stenosis per report on 11/14/2011  . 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/25/2016   Edwards McArthy-Adams IMR ETLogix ring annuloplasty (Model 4100, Serial [id number], size 26) placed via right mini thoracotomy approach  . Stenosis of left subclavian artery (HCC) 08/22/2016  . Stroke [facility])    tia with no deficits    ROS:   All systems reviewed and negative except as noted in the HPI.   Past Surgical History:  Procedure Laterality Date  . APPENDECTOMY    . BIV ICD INSERTION CRT-D N/A 01/24/2017   Procedure: BIV ICD INSERTION CRT-D;  Surgeon: Marinus Maw, MD;  Location: [facility]  INVASIVE CV LAB;  Service: Cardiovascular;  Laterality: N/A;  . CARDIAC CATHETERIZATION  04/14/2013   Non-obstructive disease, patent CFX stent  . CARDIAC CATHETERIZATION N/A 09/21/2014   Procedure: Left Heart Cath and Coronary Angiography;  Surgeon: Lennette Bihari, MD;  Location: [facility] INVASIVE CV LAB;  Service: Cardiovascular;  Laterality: N/A;  . CORONARY ANGIOPLASTY WITH STENT PLACEMENT  01/17/2013   mild disease except 99% CFX, rx with  2.5 x 28 Alpine drug-eluting stent   . GROIN DEBRIDEMENT Right 04/14/2013   Procedure: Emergency Evacuation of Retroperitoneal Hematoma and Repair Right External Iliac Artery Pseudoaneurysm    ;  Surgeon: Sherren Kerns, MD;  Location: [facility] OR;  Service: Vascular;  Laterality: Right;  . LEFT HEART CATHETERIZATION WITH CORONARY ANGIOGRAM N/A 01/18/2013   Procedure: LEFT HEART CATHETERIZATION WITH CORONARY ANGIOGRAM;  Surgeon: Corky Crafts, MD;  Location: [facility] CATH LAB;  Service: Cardiovascular;  Laterality: N/A;  . LEFT HEART CATHETERIZATION WITH CORONARY ANGIOGRAM N/A 04/14/2013   Procedure: LEFT HEART CATHETERIZATION WITH CORONARY ANGIOGRAM;  Surgeon: Corky Crafts, MD;  Location: [facility] CATH LAB;  Service: Cardiovascular;  Laterality: N/A;  . MITRAL VALVE REPAIR Right 08/25/2016   Procedure: MINIMALLY INVASIVE MITRAL VALVE REPAIR;  Surgeon: Purcell Nails, MD;  Location: [facility] OR;  Service: Open Heart Surgery;  Laterality: Right;  . MULTIPLE EXTRACTIONS WITH ALVEOLOPLASTY N/A 08/23/2016   Procedure: Extraction of tooth #'s 17, 20-23, 26-29 with alveoloplasty;  Surgeon: [name], DDS;  Location: [facility] OR;  Service: Oral Surgery;  Laterality: N/A;  . PERCUTANEOUS CORONARY STENT INTERVENTION (PCI-S)  01/18/2013   Procedure: PERCUTANEOUS CORONARY STENT INTERVENTION (PCI-S);  Surgeon: Corky Crafts, MD;  Location: [facility] CATH LAB;  Service: Cardiovascular;;  . RIGHT/LEFT HEART CATH AND CORONARY ANGIOGRAPHY N/A 08/21/2016   Procedure: RIGHT/LEFT HEART CATH AND CORONARY ANGIOGRAPHY;  Surgeon: Kathleene Hazel, MD;  Location: [facility] INVASIVE CV LAB;  Service: Cardiovascular;  Laterality: N/A;  . TEE WITHOUT CARDIOVERSION N/A 03/06/2014   Procedure: TRANSESOPHAGEAL ECHOCARDIOGRAM (TEE);  Surgeon: Lars Masson, MD;  Location: [facility] ENDOSCOPY;  Service: Cardiovascular;  Laterality: N/A;  . TEE WITHOUT CARDIOVERSION N/A 08/18/2016   Procedure: TRANSESOPHAGEAL ECHOCARDIOGRAM (TEE);  Surgeon: Lewayne Bunting, MD;  Location: [facility] ENDOSCOPY;  Service: Cardiovascular;  Laterality: N/A;  . TEE WITHOUT CARDIOVERSION N/A 08/25/2016   Procedure: TRANSESOPHAGEAL ECHOCARDIOGRAM (TEE);  Surgeon: Purcell Nails, MD;   Location: [facility] OR;  Service: Open Heart Surgery;  Laterality: N/A;  . TUBAL LIGATION       Family History  Problem Relation Age of Onset  . CAD Mother 36  . Lung cancer Mother   . Bladder Cancer Mother   . Stroke Mother   . Heart disease Mother        Before age 33  . Hypertension Mother   . Heart attack Mother   . CAD Father 61  . Heart disease Father        Before age 29  . Heart attack Father   . Stroke Father        Bleeding stroke     Social History   Socioeconomic History  . Marital status: Single    Spouse name: Not on file  . Number of children: Not on file  . Years of education: Not on file  . Highest education level: Not on file  Occupational History  . Not on file  Social Needs  . Financial resource strain: Not on file  . Food insecurity:  Worry: Not on file    Inability: Not on file  . Transportation needs:    Medical: Not on file    Non-medical: Not on file  Tobacco Use  . Smoking status: Former Smoker    Packs/day: 0.50    Types: Cigarettes    Last attempt to quit: 08/19/2015    Years since quitting: 2.4  . Smokeless tobacco: Never Used  . Tobacco comment: i QUIT SMOKING A YEAR AGO, i DONT REMEMBER WHAT MONTH  Substance and Sexual Activity  . Alcohol use: No    Alcohol/week: 0.0 standard drinks  . Drug use: Yes    Types: Marijuana  . Sexual activity: Yes    Birth control/protection: None  Lifestyle  . Physical activity:    Days per week: Not on file    Minutes per session: Not on file  . Stress: Not on file  Relationships  . Social connections:    Talks on phone: Not on file    Gets together: Not on file    Attends religious service: Not on file    Active member of club or organization: Not on file    Attends meetings of clubs or organizations: Not on file    Relationship status: Not on file  . Intimate partner violence:    Fear of current or ex partner: Not on file    Emotionally abused: Not on file    Physically abused: Not on  file    Forced sexual activity: Not on file  Other Topics Concern  . Not on file  Social History Narrative  . Not on file     BP 124/70   Pulse 65   Ht 5\' 1"  (1.549 m)   Wt 125 lb (56.7 kg)   SpO2 97%   BMI 23.62 kg/m   Physical Exam:  Well appearing NAD HEENT: Unremarkable Neck:  No JVD, no thyromegally Lymphatics:  No adenopathy Back:  No CVA tenderness Lungs:  Clear with no wheezes HEART:  Regular rate rhythm, no murmurs, no rubs, no clicks Abd:  soft, positive bowel sounds, no organomegally, no rebound, no guarding Ext:  2 plus pulses, no edema, no cyanosis, no clubbing Skin:  No rashes no nodules Neuro:  CN II through XII intact, motor grossly intact  DEVICE  Normal device function.  See PaceArt for details.   Assess/Plan: 1. Chronic systolic heart failure - her symptoms are class 2. She will maintain a low sodium diet and I have asked her to exercise more.  2. ICD - her leads are working normally. No diaphragmatic stimulation. Her LV port includes a his bundle lead.  Lovie Zarling,M.D. 3.

## 2018-02-15 NOTE — Patient Instructions (Signed)
Medication Instructions:  Your physician recommends that you continue on your current medications as directed. Please refer to the Current Medication list given to you today.  Labwork: None ordered.  Testing/Procedures: None ordered.  Follow-Up: Your physician wants you to follow-up in: one year with Dr. Ladona Ridgel.   You will receive a reminder letter in the mail two months in advance. If you don't receive a letter, please call our office to schedule the follow-up appointment.  Remote monitoring is used to monitor your ICD from home. This monitoring reduces the number of office visits required to check your device to one time per year. It allows [place] to keep an eye on the functioning of your device to ensure it is working properly. You are scheduled for a device check from home on 04/29/2018. You may send your transmission at any time that day. If you have a wireless device, the transmission will be sent automatically. After your physician reviews your transmission, you will receive a postcard with your next transmission date.  Any Other Special Instructions Will Be Listed Below (If Applicable).  If you need a refill on your cardiac medications before your next appointment, please call your pharmacy.

## 2018-02-18 ENCOUNTER — Telehealth: Payer: Self-pay | Admitting: Interventional Cardiology

## 2018-02-18 ENCOUNTER — Other Ambulatory Visit: Payer: Self-pay | Admitting: *Deleted

## 2018-02-18 MED ORDER — CLOPIDOGREL BISULFATE 75 MG PO TABS: 75.0000 mg | ORAL_TABLET | Freq: Every day | ORAL | 0 refills | Status: DC

## 2018-02-18 NOTE — Telephone Encounter (Signed)
New Message    *STAT* If patient is at the pharmacy, call can be transferred to refill team.   1. Which medications need to be refilled? (please list name of each medication and dose if known) Plavix 75mg   2. Which pharmacy/location (including street and city if local pharmacy) is medication to be sent to? [facility] in [place]  3. Do they need a 30 day or 90 day supply? 90 day

## 2018-02-19 LAB — CUP PACEART INCLINIC DEVICE CHECK
Battery Voltage: 2.97 V
Brady Statistic AP VP Percent: 0.03 %
Brady Statistic AP VS Percent: 0.01 %
Brady Statistic AS VP Percent: 99.65 %
Brady Statistic RA Percent Paced: 0.04 %
Brady Statistic RV Percent Paced: 99.54 %
Date Time Interrogation Session: 20200214172737
HighPow Impedance: 85 Ohm
Implantable Lead Implant Date: 20190124
Implantable Lead Implant Date: 20190124
Implantable Lead Implant Date: 20190124
Implantable Lead Location: 753859
Implantable Lead Location: 753860
Implantable Lead Location: 753860
Implantable Lead Model: 3830
Implantable Lead Model: 5076
Lead Channel Impedance Value: 247 Ohm
Lead Channel Impedance Value: 323 Ohm
Lead Channel Impedance Value: 456 Ohm
Lead Channel Impedance Value: 456 Ohm
Lead Channel Impedance Value: 456 Ohm
Lead Channel Impedance Value: 570 Ohm
Lead Channel Pacing Threshold Amplitude: 0.625 V
Lead Channel Pacing Threshold Amplitude: 0.75 V
Lead Channel Pacing Threshold Amplitude: 0.875 V
Lead Channel Pacing Threshold Pulse Width: 0.4 ms
Lead Channel Pacing Threshold Pulse Width: 0.4 ms
Lead Channel Pacing Threshold Pulse Width: 0.5 ms
Lead Channel Sensing Intrinsic Amplitude: 1.5 mV
Lead Channel Sensing Intrinsic Amplitude: 1.625 mV
Lead Channel Setting Pacing Amplitude: 1.25 V
Lead Channel Setting Pacing Amplitude: 1.5 V
Lead Channel Setting Pacing Pulse Width: 0.4 ms
Lead Channel Setting Sensing Sensitivity: 0.3 mV
MDC IDC MSMT BATTERY REMAINING LONGEVITY: 86 mo
MDC IDC MSMT LEADCHNL RV SENSING INTR AMPL: 9.5 mV
MDC IDC MSMT LEADCHNL RV SENSING INTR AMPL: 9.875 mV
MDC IDC PG IMPLANT DT: 20190124
MDC IDC SET LEADCHNL LV PACING PULSEWIDTH: 0.5 ms
MDC IDC SET LEADCHNL RV PACING AMPLITUDE: 2.5 V
MDC IDC STAT BRADY AS VS PERCENT: 0.31 %

## 2018-03-21 ENCOUNTER — Telehealth: Payer: Self-pay

## 2018-03-21 NOTE — Telephone Encounter (Signed)
Appointment Cancelled due to Coronavirus:  Called patient in regards to 6 mo f/u appointment with Dr. Eldridge Dace on 03/25/18.   Patient denies having any chest pain, SOB, increasing edema, wt gain, or increase in abdominal girth, syncope, or any other Sx. Patient states that she is seeing her PCP next week.  Patient was okay with cancelling appointment and has been made aware that they will be contacted in the near future to reschedule.   Patient understands to let us know if they develop any Sx before then.  No refills needed at this time.  Will route to "CV DIV C19 CANCEL" pool.

## 2018-03-25 ENCOUNTER — Ambulatory Visit: Payer: Medicaid - Out of State | Admitting: Interventional Cardiology

## 2018-04-05 ENCOUNTER — Telehealth: Payer: Self-pay | Admitting: Interventional Cardiology

## 2018-04-05 NOTE — Telephone Encounter (Signed)
Patient states that her monitor for her ICD is not working and she cannot send a remote transmission. She states that she has unplugged it and plugged it back in. Asked patient to reach out to device company to help troubleshoot. Will forward to device triage for review.

## 2018-04-05 NOTE — Telephone Encounter (Signed)
Virtual Visit Pre-Appointment Phone Call    TELEPHONE CALL NOTE  Lindsay Camacho has been deemed a candidate for a follow-up tele-health visit to limit community exposure during the Covid-19 pandemic. I spoke with the patient via phone to ensure availability of phone/video source, confirm preferred email & phone number, and discuss instructions and expectations.  I reminded Lindsay Camacho to be prepared with any vital sign and/or heart rhythm information that could potentially be obtained via home monitoring, at the time of her visit. I reminded Lindsay Camacho to expect a phone call at the time of her visit if her visit.  Did the patient verbally acknowledge consent to treatment? YES  Patient scheduled for VIDEO Visit with Dr. Eldridge Dace on 4/6  Lattie Haw, [place] 04/05/2018 5:25 PM   DOWNLOADING THE WEBEX SOFTWARE TO SMARTPHONE  - If Apple, go to [employer] and type in WebEx in the search bar. Download [employer], the blue/green circle. The app is free but as with any other app downloads, their phone may require them to verify saved payment information or Apple password. The patient does NOT have to create an account.  - If Android, ask patient to go to [employer] and type in WebEx in the search bar. Download [employer], the blue/green circle. The app is free but as with any other app downloads, their phone may require them to verify saved payment information or Android password. The patient does NOT have to create an account.   CONSENT FOR TELE-HEALTH VISIT - PLEASE REVIEW  I hereby voluntarily request, consent and authorize [facility] and its employed or contracted physicians, physician assistants, nurse practitioners or other licensed health care professionals (the Practitioner), to provide me with telemedicine health care services (the "Services") as deemed necessary by the treating Practitioner. I acknowledge and consent to receive the Services by the  Practitioner via telemedicine. I understand that the telemedicine visit will involve communicating with the Practitioner through live audiovisual communication technology and the disclosure of certain medical information by electronic transmission. I acknowledge that I have been given the opportunity to request an in-person assessment or other available alternative prior to the telemedicine visit and am voluntarily participating in the telemedicine visit.  I understand that I have the right to withhold or withdraw my consent to the use of telemedicine in the course of my care at any time, without affecting my right to future care or treatment, and that the Practitioner or I may terminate the telemedicine visit at any time. I understand that I have the right to inspect all information obtained and/or recorded in the course of the telemedicine visit and may receive copies of available information for a reasonable fee.  I understand that some of the potential risks of receiving the Services via telemedicine include:  Marland Kitchen Delay or interruption in medical evaluation due to technological equipment failure or disruption; . Information transmitted may not be sufficient (e.g. poor resolution of images) to allow for appropriate medical decision making by the Practitioner; and/or  . In rare instances, security protocols could fail, causing a breach of personal health information.  Furthermore, I acknowledge that it is my responsibility to provide information about my medical history, conditions and care that is complete and accurate to the best of my ability. I acknowledge that Practitioner's advice, recommendations, and/or decision may be based on factors not within their control, such as incomplete or inaccurate data provided by me or distortions of  diagnostic images or specimens that may result from electronic transmissions. I understand that the practice of medicine is not an exact science and that Practitioner makes  no warranties or guarantees regarding treatment outcomes. I acknowledge that I will receive a copy of this consent concurrently upon execution via email to the email address I last provided but may also request a printed copy by calling the office of [facility].    I understand that my insurance will be billed for this visit.   I have read or had this consent read to me. . I understand the contents of this consent, which adequately explains the benefits and risks of the Services being provided via telemedicine.  . I have been provided ample opportunity to ask questions regarding this consent and the Services and have had my questions answered to my satisfaction. . I give my informed consent for the services to be provided through the use of telemedicine in my medical care  By participating in this telemedicine visit I agree to the above.

## 2018-04-05 NOTE — Telephone Encounter (Signed)
New message:   Patient calling concerning a reading. Please call patient back.

## 2018-04-07 NOTE — Progress Notes (Signed)
Virtual Visit via Video Note   This visit type was conducted due to national recommendations for restrictions regarding the COVID-19 Pandemic (e.g. social distancing) in an effort to limit this patient's exposure and mitigate transmission in our community.  Due to her co-morbid illnesses, this patient is at least at moderate risk for complications without adequate follow up.  This format is felt to be most appropriate for this patient at this time.  All issues noted in this document were discussed and addressed.  A limited physical exam was performed with this format.  Please refer to the patient's chart for her consent to telehealth for [facility].  Evaluation Performed:  Follow-up visit  This visit type was conducted due to national recommendations for restrictions regarding the COVID-19 Pandemic (e.g. social distancing).  This format is felt to be most appropriate for this patient at this time.  All issues noted in this document were discussed and addressed.  No physical exam was performed (except for noted visual exam findings with Video Visits).  Please refer to the patient's chart (MyChart message for video visits and phone note for telephone visits) for the patient's consent to telehealth for [facility].  Date:  04/08/2018   ID:  Lindsay Camacho, DOB June 09, 1965, MRN [id number]  Patient Location:  Home  Provider location:   [facility]  PCP:  Montez Hageman, DO  Cardiologist:  Lance Muss, MD  Electrophysiologist:  None   Chief Complaint:  CAD/CHF  History of Present Illness:    Lindsay Camacho is a 53 y.o. female who presents via audio/video conferencing for a telehealth visit today.     with ah/o CAD s/plateral wall MI in 2015treated with PCI to the CXF. She had a repeat cath in 09/2014 showing patent stent. She unfortunately had a RP bleed post cath and required vascular surgery. She had another R/LHC prior to valve surgery 08/21/16 that showed patent CFX stent  with minal restenosis + mild nonobstructive CAD in the RCA and LAD. She also has h/o chronic systolic HF w/ EF of 35-40%, severe MR and tobacco abuse.   She underwent minimally invasive mitral valve repair, per Dr. Cornelius Moras, for severe symptomatic MR. Surgery was 08/25/16. Post op recovery was fairly uneventfully.   [place] in [place], [place] on 1[place]15 with worsening SOB r/t A/C systolic heart failure. She was diuresed with IV lasix and was transitioned to PO. She was transferred on 1/20 for ICD evaluation after an echo showed worsening EF of 20-25% (35-40% 09/2016). She did not require further diuresis.SuccessfulMedtronicICD implantation on 1/23but unsuccessful LV leaddue to anatomy. Has HIS and RV lead.  CHF meds have been limited by low BP in the past.   She notes that when she gives her Praluent injection on the right side, her right foot hurts when she walks.  If she tries the left side, the same thing happens with the left foot.  She has a new monitor coming for her pacer.   Denies : Chest pain. Dizziness. Leg edema. Nitroglycerin use. Orthopnea. Palpitations. Paroxysmal nocturnal dyspnea. Shortness of breath. Syncope.   She walks to the store several times a day without problems, totals more than a mile.  Avoiding salt in her diet.   The patient does not have symptoms concerning for COVID-19 infection (fever, chills, cough, or new shortness of breath).    Prior CV studies:   The following studies were reviewed today:  LDL 13 in 8/19  Past Medical History:  Diagnosis  Date   Acute myocardial infarction of other lateral wall, initial episode of care 01/2013   DES CFX   Aortoiliac occlusive disease (HCC) 08/22/2016   CAD (coronary artery disease) 11/13/2011   50% ? proximal LCx stenosis per Cath at [facility]   Chest pain, atypical, muscular skelatal   01/09/2014   Dyspnea    Heart failure (HCC)    HLD (hyperlipidemia)    Hypertension    Intermediate coronary  syndrome (HCC)    LBBB (left bundle branch block)    Mitral regurgitation    Nonischemic cardiomyopathy (HCC) 01/26/2017   PVD (peripheral vascular disease) (HCC)    Right CFA stenosis per report on 11/14/2011   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/25/2016   Edwards McArthy-Adams IMR ETLogix ring annuloplasty (Model 4100, Serial [id number], size 26) placed via right mini thoracotomy approach   Stenosis of left subclavian artery (HCC) 08/22/2016   Stroke (HCC)    tia with no deficits   Past Surgical History:  Procedure Laterality Date   APPENDECTOMY     BIV ICD INSERTION CRT-D N/A 01/24/2017   Procedure: BIV ICD INSERTION CRT-D;  Surgeon: Marinus Maw, MD;  Location: [facility] INVASIVE CV LAB;  Service: Cardiovascular;  Laterality: N/A;   CARDIAC CATHETERIZATION  04/14/2013   Non-obstructive disease, patent CFX stent   CARDIAC CATHETERIZATION N/A 09/21/2014   Procedure: Left Heart Cath and Coronary Angiography;  Surgeon: Lennette Bihari, MD;  Location: [facility] INVASIVE CV LAB;  Service: Cardiovascular;  Laterality: N/A;   CORONARY ANGIOPLASTY WITH STENT PLACEMENT  01/17/2013   mild disease except 99% CFX, rx with  2.5 x 28 Alpine drug-eluting stent    GROIN DEBRIDEMENT Right 04/14/2013   Procedure: Emergency Evacuation of Retroperitoneal Hematoma and Repair Right External Iliac Artery Pseudoaneurysm    ;  Surgeon: Sherren Kerns, MD;  Location: [facility] OR;  Service: Vascular;  Laterality: Right;   LEFT HEART CATHETERIZATION WITH CORONARY ANGIOGRAM N/A 01/18/2013   Procedure: LEFT HEART CATHETERIZATION WITH CORONARY ANGIOGRAM;  Surgeon: Corky Crafts, MD;  Location[facility] C[facility] LAB;  Service: Cardiovascular;  Laterality: N/A;   LEFT HEART CATHETERIZATION WITH CORONARY ANGIOGRAM N/A 04/14/2013   Procedure: LEFT HEART CATHETERIZATION WITH CORONARY ANGIOGRAM;  Surgeon: Corky Crafts, MD;  Location: [facility] CATH LAB;  Service: Cardiovascular;  Laterality: N/A;   MITRAL VALVE REPAIR Right  08/25/2016   Procedure: MINIMALLY INVASIVE MITRAL VALVE REPAIR;  Surgeon: Purcell Nails, MD;  Location: [facility] OR;  Service: Open Heart Surgery;  Laterality: Right;   MULTIPLE EXTRACTIONS WITH ALVEOLOPLASTY N/A 08/23/2016   Procedure: Extraction of tooth #'s 17, 20-23, 26-29 with alveoloplasty;  Surgeon: [name], DDS;  Location: [facility] OR;  Service: Oral Surgery;  Laterality: N/A;   PERCUTANEOUS CORONARY STENT INTERVENTION (PCI-S)  01/18/2013   Procedure: PERCUTANEOUS CORONARY STENT INTERVENTION (PCI-S);  Surgeon: Corky Crafts, MD;  Location: [facility] CATH LAB;  Service: Cardiovascular;;   RIGHT/LEFT HEART CATH AND CORONARY ANGIOGRAPHY N/A 08/21/2016   Procedure: RIGHT/LEFT HEART CATH AND CORONARY ANGIOGRAPHY;  Surgeon: Kathleene Hazel, MD;  Location: [facility] INVASIVE CV LAB;  Service: Cardiovascular;  Laterality: N/A;   TEE WITHOUT CARDIOVERSION N/A 03/06/2014   Procedure: TRANSESOPHAGEAL ECHOCARDIOGRAM (TEE);  Surgeon: Lars Masson, MD;  Location: [facility] ENDOSCOPY;  Service: Cardiovascular;  Laterality: N/A;   TEE WITHOUT CARDIOVERSION N/A 08/18/2016   Procedure: TRANSESOPHAGEAL ECHOCARDIOGRAM (TEE);  Surgeon: Lewayne Bunting, MD;  Location: [facility] ENDOSCOPY;  Service: Cardiovascular;  Laterality: N/A;   TEE WITHOUT CARDIOVERSION  N/A 08/25/2016   Procedure: TRANSESOPHAGEAL ECHOCARDIOGRAM (TEE);  Surgeon: Purcell Nails, MD;  Location: [facility] OR;  Service: Open Heart Surgery;  Laterality: N/A;   TUBAL LIGATION       Current Meds  Medication Sig   acetaminophen (TYLENOL) 500 MG tablet Take 1,000 mg by mouth every 6 (six) hours as needed for headache (pain).   Alirocumab (PRALUENT) 75 MG/ML SOPN Inject 1 pen into the skin every 14 (fourteen) days.   aspirin EC 81 MG tablet Take 81 mg by mouth daily.   atorvastatin (LIPITOR) 80 MG tablet TAKE 1 TABLET BY MOUTH EVERY DAY   carvedilol (COREG) 6.25 MG tablet Take 6.25 mg by mouth 2 (two) times daily with a meal.   clopidogrel (PLAVIX)  75 MG tablet Take 1 tablet (75 mg total) by mouth daily with breakfast.   ezetimibe (ZETIA) 10 MG tablet Take 1 tablet (10 mg total) by mouth daily.   furosemide (LASIX) 40 MG tablet Take 40 mg by mouth daily as needed for fluid or edema (shortness of breath).    losartan (COZAAR) 25 MG tablet Take 0.5 tablets (12.5 mg total) by mouth daily.     Allergies:   Aldomet [methyldopa]; Brilinta [ticagrelor]; Other; Coumadin [warfarin sodium]; and Penicillins   Social History   Tobacco Use   Smoking status: Former Smoker    Packs/day: 0.50    Types: Cigarettes    Last attempt to quit: 08/19/2015    Years since quitting: 2.6   Smokeless tobacco: Never Used   Tobacco comment: i QUIT SMOKING A YEAR AGO, i DONT REMEMBER WHAT MONTH  Substance Use Topics   Alcohol use: No    Alcohol/week: 0.0 standard drinks   Drug use: Yes    Types: Marijuana     Family Hx: The patient's family history includes Bladder Cancer in her mother; CAD (age of onset: 3) in her mother; CAD (age of onset: 71) in her father; Heart attack in her father and mother; Heart disease in her father and mother; Hypertension in her mother; Lung cancer in her mother; Stroke in her father and mother.  ROS:   Please see the history of present illness.    Foot pain All other systems reviewed and are negative.   Labs/Other Tests and Data Reviewed:    Recent Labs: 08/30/2017: ALT 20 10/01/2017: BUN 6; Creatinine, Ser 0.80; Potassium 3.9; Sodium 141   Recent Lipid Panel Lab Results  Component Value Date/Time   CHOL 56 (L) 08/30/2017 09:45 AM   TRIG 75 08/30/2017 09:45 AM   HDL 28 (L) 08/30/2017 09:45 AM   CHOLHDL 2.0 08/30/2017 09:45 AM   CHOLHDL 5.1 01/23/2017 04:20 AM   LDLCALC 13 08/30/2017 09:45 AM    Wt Readings from Last 3 Encounters:  04/08/18 120 lb (54.4 kg)  02/15/18 125 lb (56.7 kg)  08/20/17 134 lb (60.8 kg)     Objective:    Vital Signs:  BP 119/76    Pulse (!) 58    Ht 5\' 1"  (1.549 m)    Wt 120  lb (54.4 kg)    BMI 22.67 kg/m    Well nourished, well developed female in no acute distress. No obvious shortness of breath   ASSESSMENT & PLAN:    1.  CAD/Old MI: No angina.  Not using any SL NTG.    2. MV repair: Use SBE prophylaxis.  No teeth at this time.  She has dentures, upper and lower.   Chronic systolic heart failure:  s/p BiV ICD. BP well controlled.  She will let us know when the new monitor arrives for her device.    Hyperlipidemia: Well controlled on Praluent.  Will check with PharmD regarding side effect she mentioned about foot pain.  COVID-19 Education: The signs and symptoms of COVID-19 were discussed with the patient and how to seek care for testing (follow up with PCP or arrange E-visit).  The importance of social distancing was discussed today.  Patient Risk:   After full review of this patient's clinical status, I feel that they are at least moderate risk at this time.  Time:   Today, I have spent 20 minutes with the patient with telehealth technology discussing lipids, CAD, COVID.     Medication Adjustments/Labs and Tests Ordered: Current medicines are reviewed at length with the patient today.  Concerns regarding medicines are outlined above.  Tests Ordered: No orders of the defined types were placed in this encounter.  Medication Changes: No orders of the defined types were placed in this encounter.   Disposition:  Follow up in 6 month(s)  Signed, Lance Muss, MD  04/08/2018 9:20 AM    [facility]

## 2018-04-08 ENCOUNTER — Encounter: Payer: Self-pay | Admitting: Interventional Cardiology

## 2018-04-08 ENCOUNTER — Telehealth: Payer: Self-pay | Admitting: Pharmacist

## 2018-04-08 ENCOUNTER — Other Ambulatory Visit: Payer: Self-pay

## 2018-04-08 ENCOUNTER — Telehealth (INDEPENDENT_AMBULATORY_CARE_PROVIDER_SITE_OTHER): Payer: Medicare Other | Admitting: Interventional Cardiology

## 2018-04-08 VITALS — BP 119/76 | HR 58 | Ht 61.0 in | Wt 120.0 lb

## 2018-04-08 DIAGNOSIS — Z9889 Other specified postprocedural states: Secondary | ICD-10-CM

## 2018-04-08 DIAGNOSIS — I5022 Chronic systolic (congestive) heart failure: Secondary | ICD-10-CM | POA: Diagnosis not present

## 2018-04-08 DIAGNOSIS — I25118 Atherosclerotic heart disease of native coronary artery with other forms of angina pectoris: Secondary | ICD-10-CM | POA: Diagnosis not present

## 2018-04-08 DIAGNOSIS — E785 Hyperlipidemia, unspecified: Secondary | ICD-10-CM | POA: Diagnosis not present

## 2018-04-08 DIAGNOSIS — Z95810 Presence of automatic (implantable) cardiac defibrillator: Secondary | ICD-10-CM

## 2018-04-08 NOTE — Telephone Encounter (Signed)
Spoke w/ pt and she informed me that a new monitor was ordered on 04/05/2018.

## 2018-04-08 NOTE — Telephone Encounter (Signed)
Received message from Dr Eldridge Dace: "Pt notes that when she gives her Praluent injection on the right side, her right foot hurts when she walks. If she tries the left side, the same thing happens with the left foot. Any thoughts?"  Called pt to discuss her side effects as unilateral foot pain is not commonly seen with PCSK9i injections. She has been giving her injections into her abdomen, not her legs. Reports the foot pain occurs for the first few days after each Praluent injection. She inquires if there is something else she can try instead of her Praluent.  Nexlizet should now be available at pharmacies (combination of bempedoic acid and ezetimibe) that pt could try in addition to her atorvastatin 80mg  daily to help keep LDL at goal if she stops her Praluent shots. I am unable to order Nexlizet in Epic since the medication is still new, so will fax rx to local pharmacy. Anticipate a prior authorization will be needed.

## 2018-04-08 NOTE — Patient Instructions (Signed)

## 2018-04-09 ENCOUNTER — Telehealth: Payer: Self-pay | Admitting: Pharmacist

## 2018-04-09 DIAGNOSIS — E785 Hyperlipidemia, unspecified: Secondary | ICD-10-CM

## 2018-04-09 NOTE — Telephone Encounter (Signed)
Error

## 2018-04-09 NOTE — Telephone Encounter (Signed)
Will need prior authorization completed for Nexletol 180mg  tablets. Will forward to [place] to see if she can assist with Nexletol prior authorization.

## 2018-04-09 NOTE — Telephone Encounter (Signed)
° ° °  Pt c/o medication issue:  1. Name of Medication: NEXLIZET  2. How are you currently taking this medication (dosage and times per day)? N/A  3. Are you having a reaction (difficulty breathing--STAT)? NO  4. What is your medication issue? PATHS does not have the combination for this medication, requesting another med be prescribed. Please call [phone]

## 2018-04-09 NOTE — Telephone Encounter (Signed)
I have done a Nexletol PA through covermymeds. Key: [id number]

## 2018-04-09 NOTE — Telephone Encounter (Signed)
The following message was just received from Cover[employer]eds:  Lorrene Reid Key: [id number] - PA Case ID: [id number] - Rx #: [id number]  Need help? Call us at [phone]    Outcome  Approvedtoday Your request has been approved  I have notified Shafa at [employer] of this approval. Per Shafa the cost to the pt for Nexletol is now $0 and she states that they do not have any of this medication in stock but that she is ordering it today and it will be ready for the pt to pick up after 2 pm tomorrow.  I have notified the pt of all above. She verbalized understanding and thanked me for my help.

## 2018-04-09 NOTE — Telephone Encounter (Addendum)
Since This medication is new and cannot be added to the pts med list I need to know if it is taken once or twice daily and does she have an intolerance to Praluent?  Please advise.

## 2018-04-09 NOTE — Telephone Encounter (Signed)
New Message    Pt is calling because she says the medication Neplaxa is not covered by her insurance    Please call

## 2018-04-09 NOTE — Telephone Encounter (Signed)
Nexlizet not available for ordering yet per pharmacy. Verbal rx given for Nexletol, pt will just continue on Zetia as well as atorvastatin 80mg  daily.

## 2018-04-10 NOTE — Addendum Note (Signed)
Addended by: SUPPLE, [name] E on: 04/10/2018 04:21 PM   Modules accepted: Orders

## 2018-04-15 NOTE — Telephone Encounter (Signed)
New monitor received. Remote transmission received 04/11/2018. Pt is scheduled for 04/29/2018

## 2018-04-23 ENCOUNTER — Telehealth: Payer: Self-pay | Admitting: Cardiology

## 2018-04-23 NOTE — Telephone Encounter (Signed)
Patient called in to confirm if remote transmission was received from new monitor. carelink web site currently down. Will call pt back later once the web site is back up and working.

## 2018-04-24 NOTE — Telephone Encounter (Signed)
Attempted to call pt. No answer and unable to leave a message.

## 2018-04-25 NOTE — Telephone Encounter (Signed)
F/u lipids and CMET ordered 3 months after starting Nexletol.

## 2018-04-25 NOTE — Telephone Encounter (Signed)
2nd attempt.   LMOVM for pt to return call. Confirm monitor SN in carelink.

## 2018-04-25 NOTE — Addendum Note (Signed)
Addended by: Loomis Anacker E on: 04/25/2018 03:00 PM   Modules accepted: Orders

## 2018-04-26 NOTE — Telephone Encounter (Signed)
3rd attempt   LMOVM for pt to return call.  

## 2018-04-29 ENCOUNTER — Other Ambulatory Visit: Payer: Self-pay

## 2018-04-29 ENCOUNTER — Ambulatory Visit (INDEPENDENT_AMBULATORY_CARE_PROVIDER_SITE_OTHER): Payer: Medicare Other | Admitting: *Deleted

## 2018-04-29 DIAGNOSIS — I5022 Chronic systolic (congestive) heart failure: Secondary | ICD-10-CM

## 2018-04-29 DIAGNOSIS — I428 Other cardiomyopathies: Secondary | ICD-10-CM

## 2018-04-29 LAB — CUP PACEART REMOTE DEVICE CHECK
Battery Remaining Longevity: 84 mo
Battery Voltage: 2.98 V
Brady Statistic AP VP Percent: 0.07 %
Brady Statistic AP VS Percent: 0.01 %
Brady Statistic AS VP Percent: 99.46 %
Brady Statistic AS VS Percent: 0.47 %
Brady Statistic RA Percent Paced: 0.08 %
Brady Statistic RV Percent Paced: 99.06 %
Date Time Interrogation Session: 20200427083723
HighPow Impedance: 79 Ohm
Implantable Lead Implant Date: 20190124
Implantable Lead Implant Date: 20190124
Implantable Lead Implant Date: 20190124
Implantable Lead Location: 753859
Implantable Lead Location: 753860
Implantable Lead Location: 753860
Implantable Lead Model: 3830
Implantable Lead Model: 5076
Implantable Lead Model: 6935
Implantable Pulse Generator Implant Date: 20190124
Lead Channel Impedance Value: 209 Ohm
Lead Channel Impedance Value: 323 Ohm
Lead Channel Impedance Value: 456 Ohm
Lead Channel Impedance Value: 456 Ohm
Lead Channel Impedance Value: 494 Ohm
Lead Channel Impedance Value: 570 Ohm
Lead Channel Pacing Threshold Amplitude: 0.625 V
Lead Channel Pacing Threshold Amplitude: 0.75 V
Lead Channel Pacing Threshold Amplitude: 0.875 V
Lead Channel Pacing Threshold Pulse Width: 0.4 ms
Lead Channel Pacing Threshold Pulse Width: 0.4 ms
Lead Channel Pacing Threshold Pulse Width: 0.5 ms
Lead Channel Sensing Intrinsic Amplitude: 0.875 mV
Lead Channel Sensing Intrinsic Amplitude: 0.875 mV
Lead Channel Sensing Intrinsic Amplitude: 10.25 mV
Lead Channel Sensing Intrinsic Amplitude: 10.25 mV
Lead Channel Setting Pacing Amplitude: 1.25 V
Lead Channel Setting Pacing Amplitude: 1.5 V
Lead Channel Setting Pacing Amplitude: 2.5 V
Lead Channel Setting Pacing Pulse Width: 0.4 ms
Lead Channel Setting Pacing Pulse Width: 0.5 ms
Lead Channel Setting Sensing Sensitivity: 0.3 mV

## 2018-04-30 ENCOUNTER — Other Ambulatory Visit: Payer: Self-pay | Admitting: Interventional Cardiology

## 2018-04-30 MED ORDER — LOSARTAN POTASSIUM 25 MG PO TABS: 12.5000 mg | ORAL_TABLET | Freq: Every day | ORAL | 3 refills | Status: DC

## 2018-05-03 NOTE — Telephone Encounter (Signed)
Patient monitor is updating.

## 2018-05-07 NOTE — Progress Notes (Signed)
Remote ICD transmission.   

## 2018-05-16 ENCOUNTER — Other Ambulatory Visit: Payer: Self-pay

## 2018-05-16 MED ORDER — ATORVASTATIN CALCIUM 80 MG PO TABS: 80.0000 mg | ORAL_TABLET | Freq: Every day | ORAL | 10 refills | Status: DC

## 2018-05-28 ENCOUNTER — Other Ambulatory Visit: Payer: Self-pay | Admitting: Interventional Cardiology

## 2018-06-25 IMAGING — CT CT ANGIO CHEST-ABD-PELV FOR DISSECTION W/ AND WO/W CM
2 of 7 series · 11 of 36 positions shown, 15 images · IV contrast (isovue)
Comparison: CT scan of September 22, 2014.

CLINICAL DATA: Preop for heart surgery.

EXAM:
CT ANGIOGRAPHY CHEST, ABDOMEN AND PELVIS
TECHNIQUE: Multidetector CT imaging through the chest, abdomen and pelvis was
performed using the standard protocol during bolus administration of
intravenous contrast. Multiplanar reconstructed images and MIPs were
obtained and reviewed to evaluate the vascular anatomy.
CONTRAST:  100 mL of Isovue 370 intravenously.

[Series 7: dissection 2mm · axial · 0.68mm/px · z∈[+974,+1522]mm · 10 of 314 slices shown, 13 images]
[im 20/314  mediastinal]
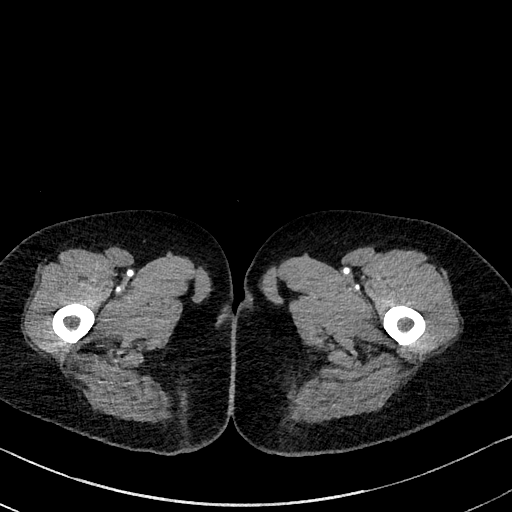
[im 20/314  bone]
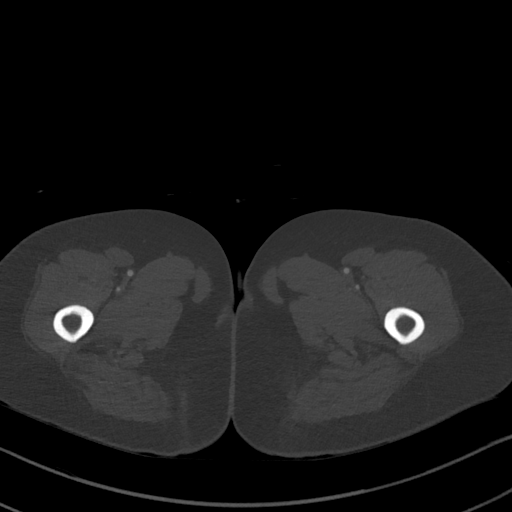
[im 59/314  mediastinal]
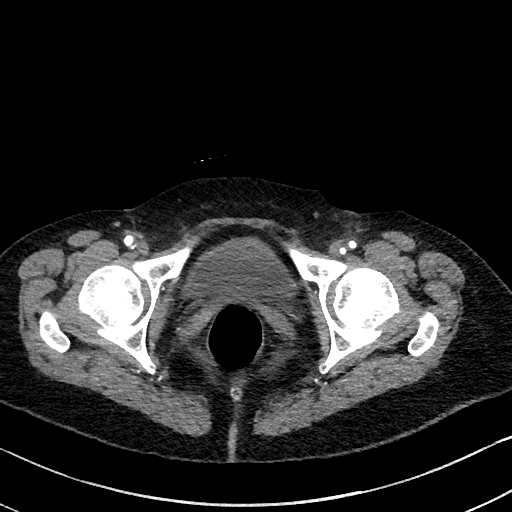
[im 98/314  mediastinal]
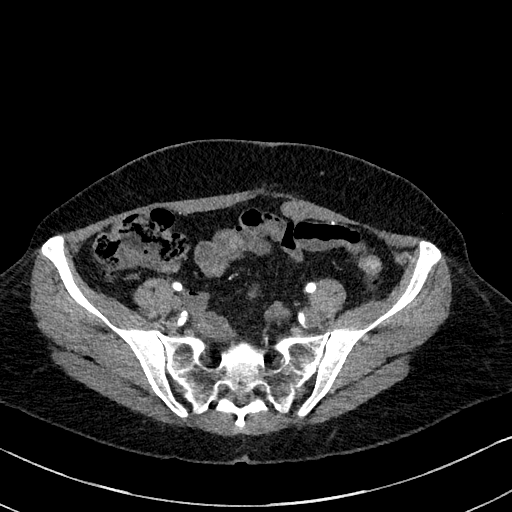
[im 137/314  mediastinal]
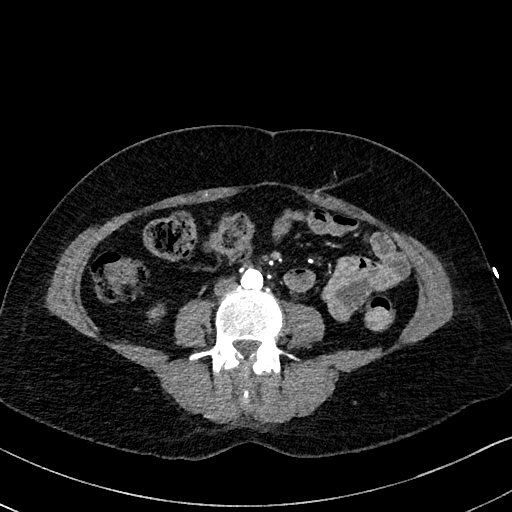
[im 177/314  mediastinal]
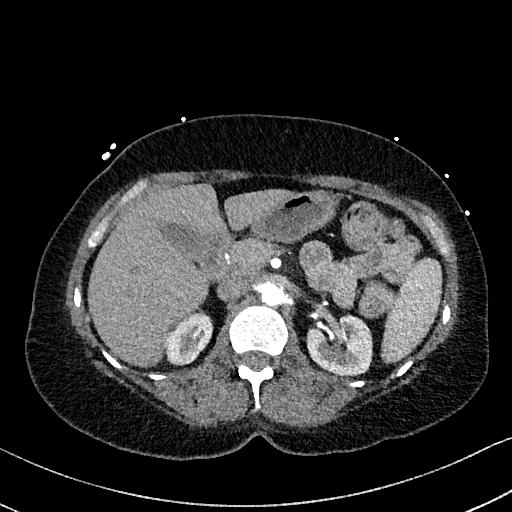
[im 216/314  mediastinal]
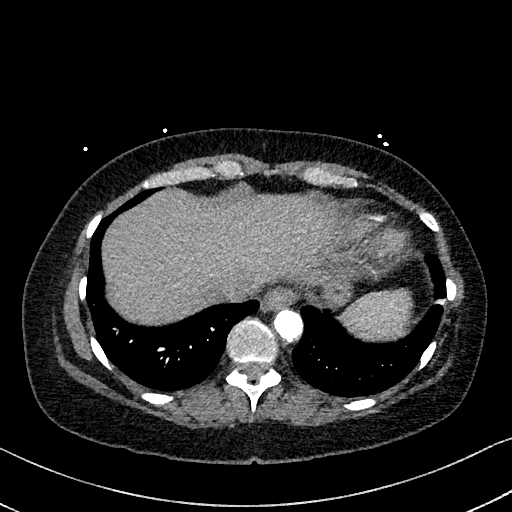
[im 235/314  lung]
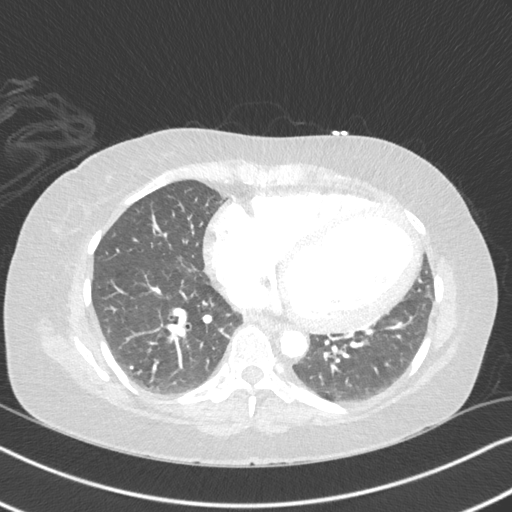
[im 255/314  mediastinal]
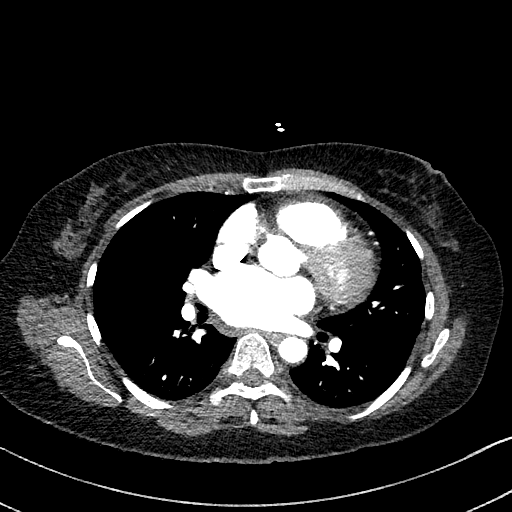
[im 255/314  lung]
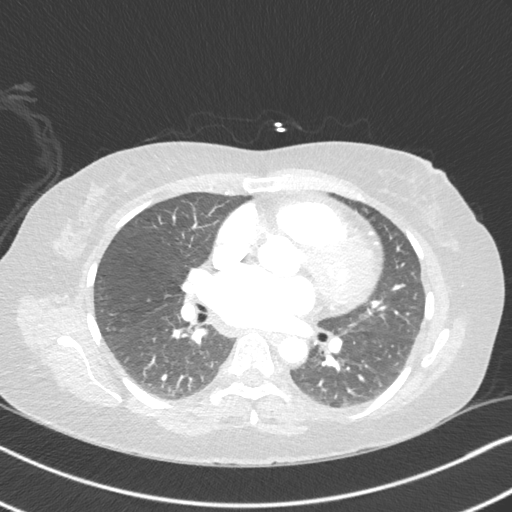
[im 274/314  lung]
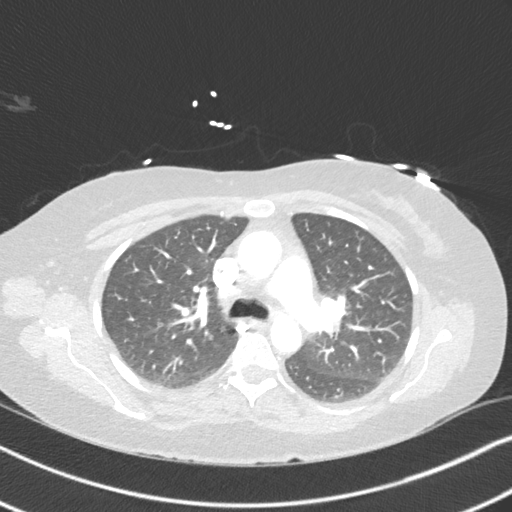
[im 294/314  mediastinal]
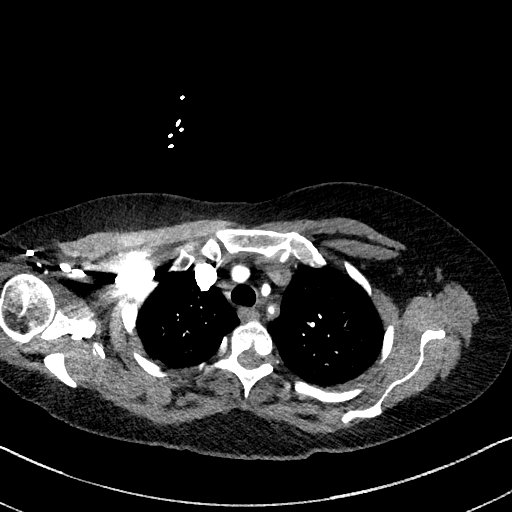
[im 294/314  lung]
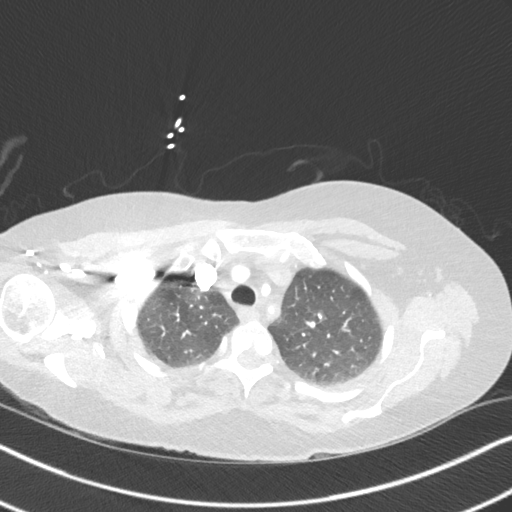

[Series 10: dissection 2mm cor · coronal · 0.76mm/px · 1 of 124 slices shown, 2 images]
[im 62/124  mediastinal]
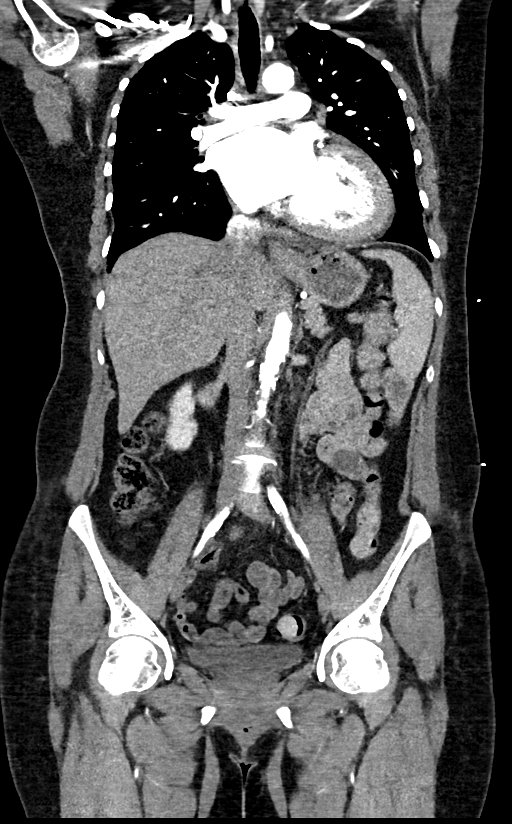
[im 62/124  bone]
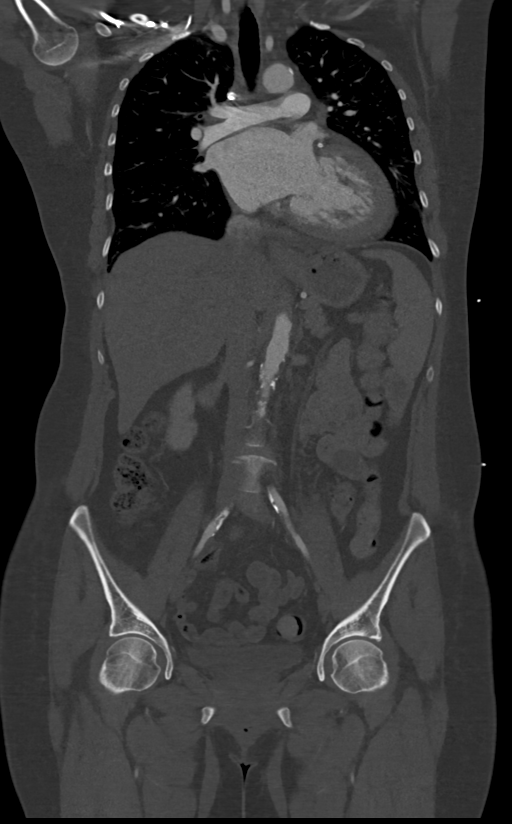

[11 of 36 positions shown; findings below may reference images not displayed]

FINDINGS: CTA CHEST FINDINGS

Cardiovascular: Atherosclerosis of thoracic aorta is noted without
aneurysm or dissection. Mild eccentric plaque is noted involving the
proximal portion of the right innominate artery. Left common carotid
artery is widely patent without stenosis. Severe stenosis is noted
in the proximal left subclavian artery. Mild cardiomegaly is noted
without pericardial effusion.

Mediastinum/Nodes: No enlarged mediastinal, hilar, or axillary lymph
nodes. Thyroid gland, trachea, and esophagus demonstrate no
significant findings.

Lungs/Pleura: Lungs are clear. No pleural effusion or pneumothorax.

Musculoskeletal: No chest wall abnormality. No acute or significant
osseous findings.

Review of the MIP images confirms the above findings.

CTA ABDOMEN AND PELVIS FINDINGS

VASCULAR

Aorta: Atherosclerosis of abdominal aorta is noted without aneurysm
or dissection.

Celiac: Patent without evidence of aneurysm, dissection, vasculitis
or significant stenosis.

SMA: Shares common origin with celiac artery. No significant
stenosis of thrombus is noted.

Renals: Both renal arteries are patent without evidence of aneurysm,
dissection, vasculitis, fibromuscular dysplasia or significant
stenosis.

IMA: Patent without evidence of aneurysm, dissection, vasculitis or
significant stenosis.

Inflow: Extensive atherosclerosis is noted moderate focal stenosis
is noted at the origin of the left common iliac artery. Left
external iliac artery is widely patent. Long length eccentric plaque
is noted in the right common iliac artery which results in moderate
stenosis. Right external iliac artery is widely patent.

Veins: No obvious venous abnormality within the limitations of this
arterial phase study.

Review of the MIP images confirms the above findings.

NON-VASCULAR

Hepatobiliary: No focal liver abnormality is seen. No gallstones,
gallbladder wall thickening, or biliary dilatation.

Pancreas: Unremarkable. No pancreatic ductal dilatation or
surrounding inflammatory changes.

Spleen: Normal in size without focal abnormality.

Adrenals/Urinary Tract: Adrenal glands are unremarkable. Kidneys are
normal, without renal calculi, focal lesion, or hydronephrosis.
Bladder is unremarkable.

Stomach/Bowel: There is no evidence of bowel obstruction or
inflammation. The stomach appears normal. The appendix is not
identified.

Lymphatic: No significant adenopathy is noted.

Reproductive: Uterus and bilateral adnexa are unremarkable.

Other: Minimal amount of free fluid is noted in the posterior pelvis
which most likely is physiologic.

Musculoskeletal: No acute or significant osseous findings.

Review of the MIP images confirms the above findings.
IMPRESSION: Atherosclerosis of thoracic and abdominal aorta is noted without
aneurysm or dissection.

Severe stenosis is seen involving the proximal left subclavian
artery.

No significant mesenteric or renal artery stenosis is noted.

Long length eccentric plaque is noted in right common iliac artery
resulting in moderate stenosis. Moderate focal stenosis is noted at
the origin of the left common iliac artery secondary to calcified
atheromatous disease.

## 2018-06-28 IMAGING — DX DG CHEST 1V PORT
1 series · 1 of 1 positions shown · non-contrast
Comparison: 08/24/2016

CLINICAL DATA: Atelectasis

EXAM:
PORTABLE CHEST 1 VIEW

[chest ap]
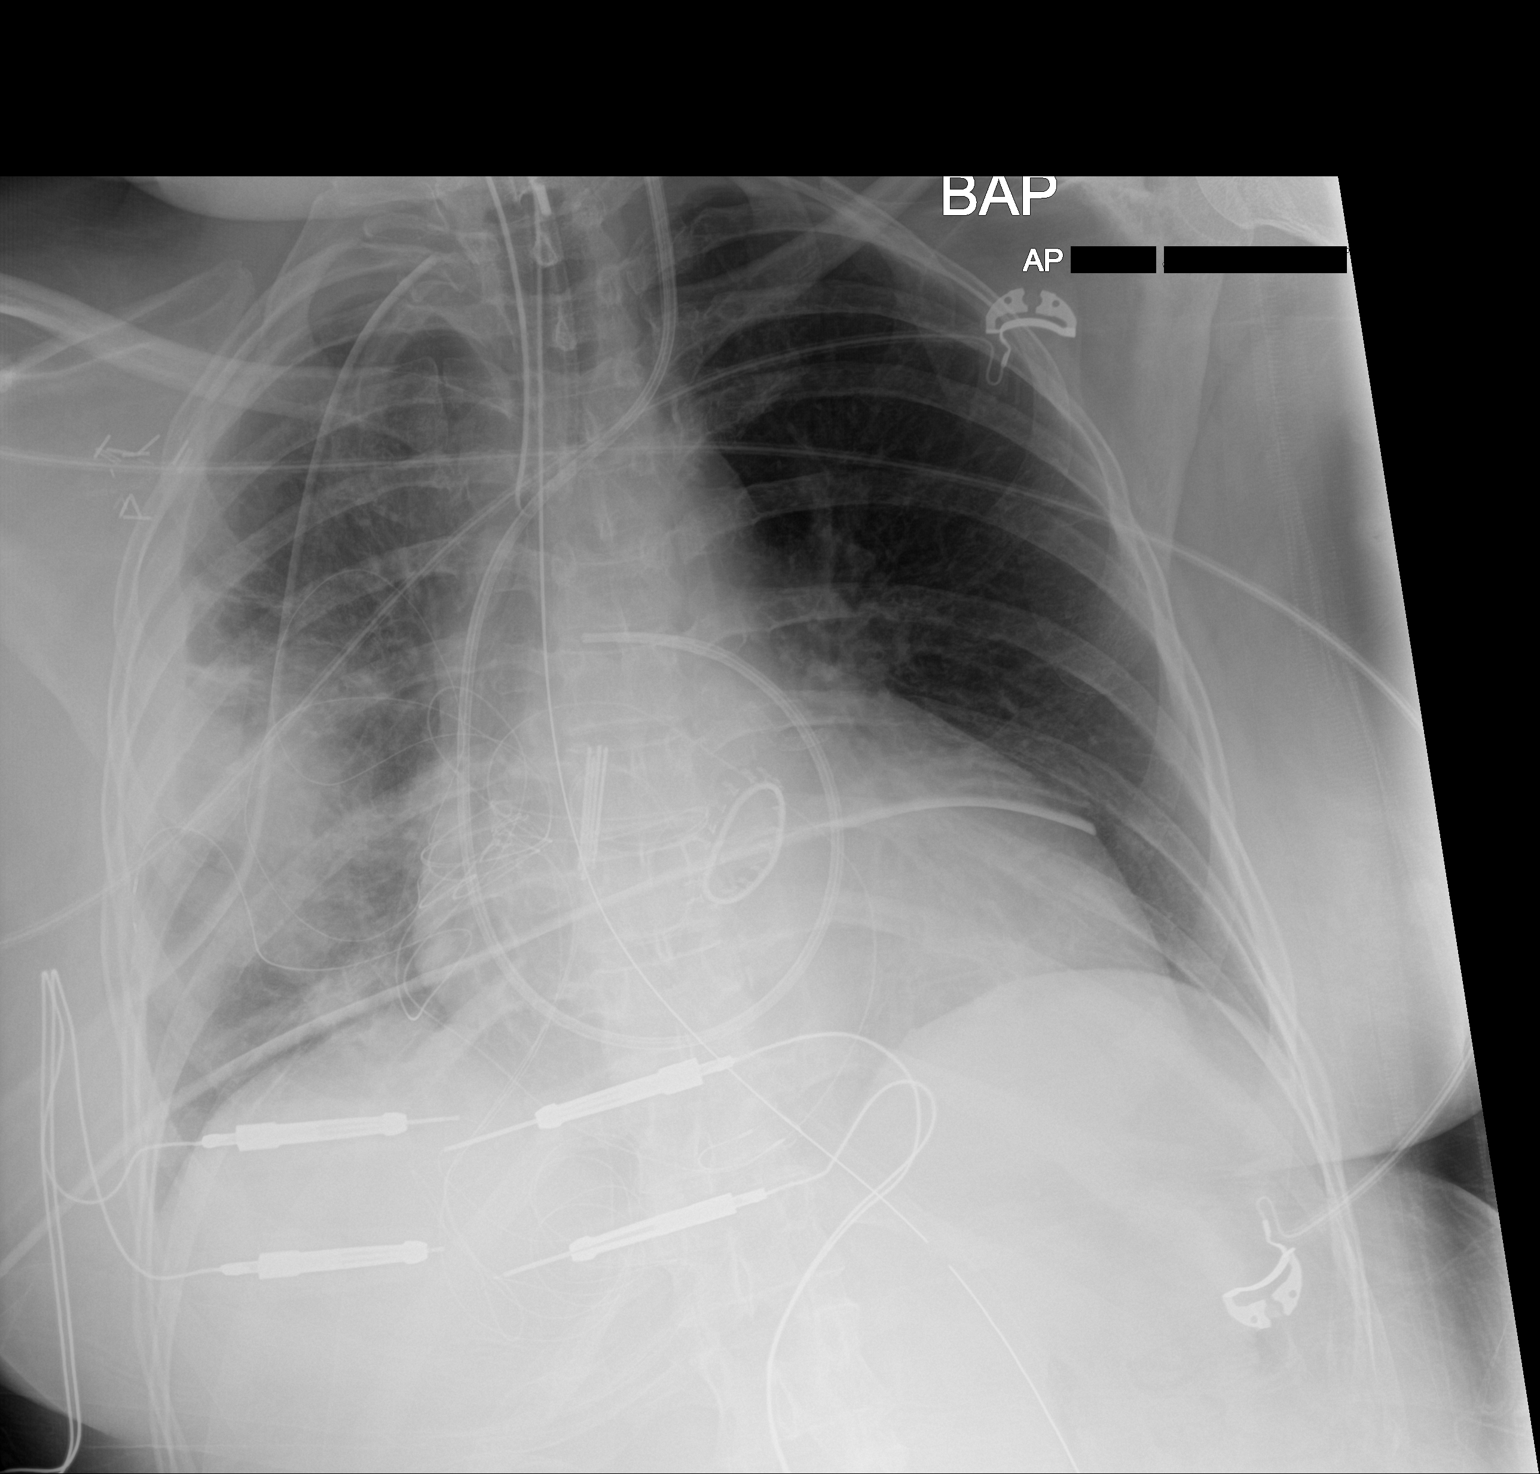

[1 of 1 positions shown; findings below may reference images not displayed]

FINDINGS: Endotracheal tube with the tip 2 cm above the carina. Swan-Ganz
catheter with the tip projecting over the right ventricular outflow
tract. Nasogastric tube coursing below the diaphragm.

Right-sided chest tube directed towards the apex. Focal area of
airspace disease in the right mid lung likely reflecting atelectasis
versus contusion. No pneumothorax. Stable cardiomediastinal
silhouette. Interval aortic valve replacement.
IMPRESSION: 1. Support lines and tubing in satisfactory position.
2. Focal area of airspace disease in the right mid lung likely
reflecting atelectasis versus contusion.

## 2018-06-29 IMAGING — CR DG CHEST 1V PORT
1 series · 1 of 1 positions shown · non-contrast
Comparison: 08/25/2016

CLINICAL DATA: Atelectasis.  Post mitral valve repair.

EXAM:
PORTABLE CHEST 1 VIEW

[AP]
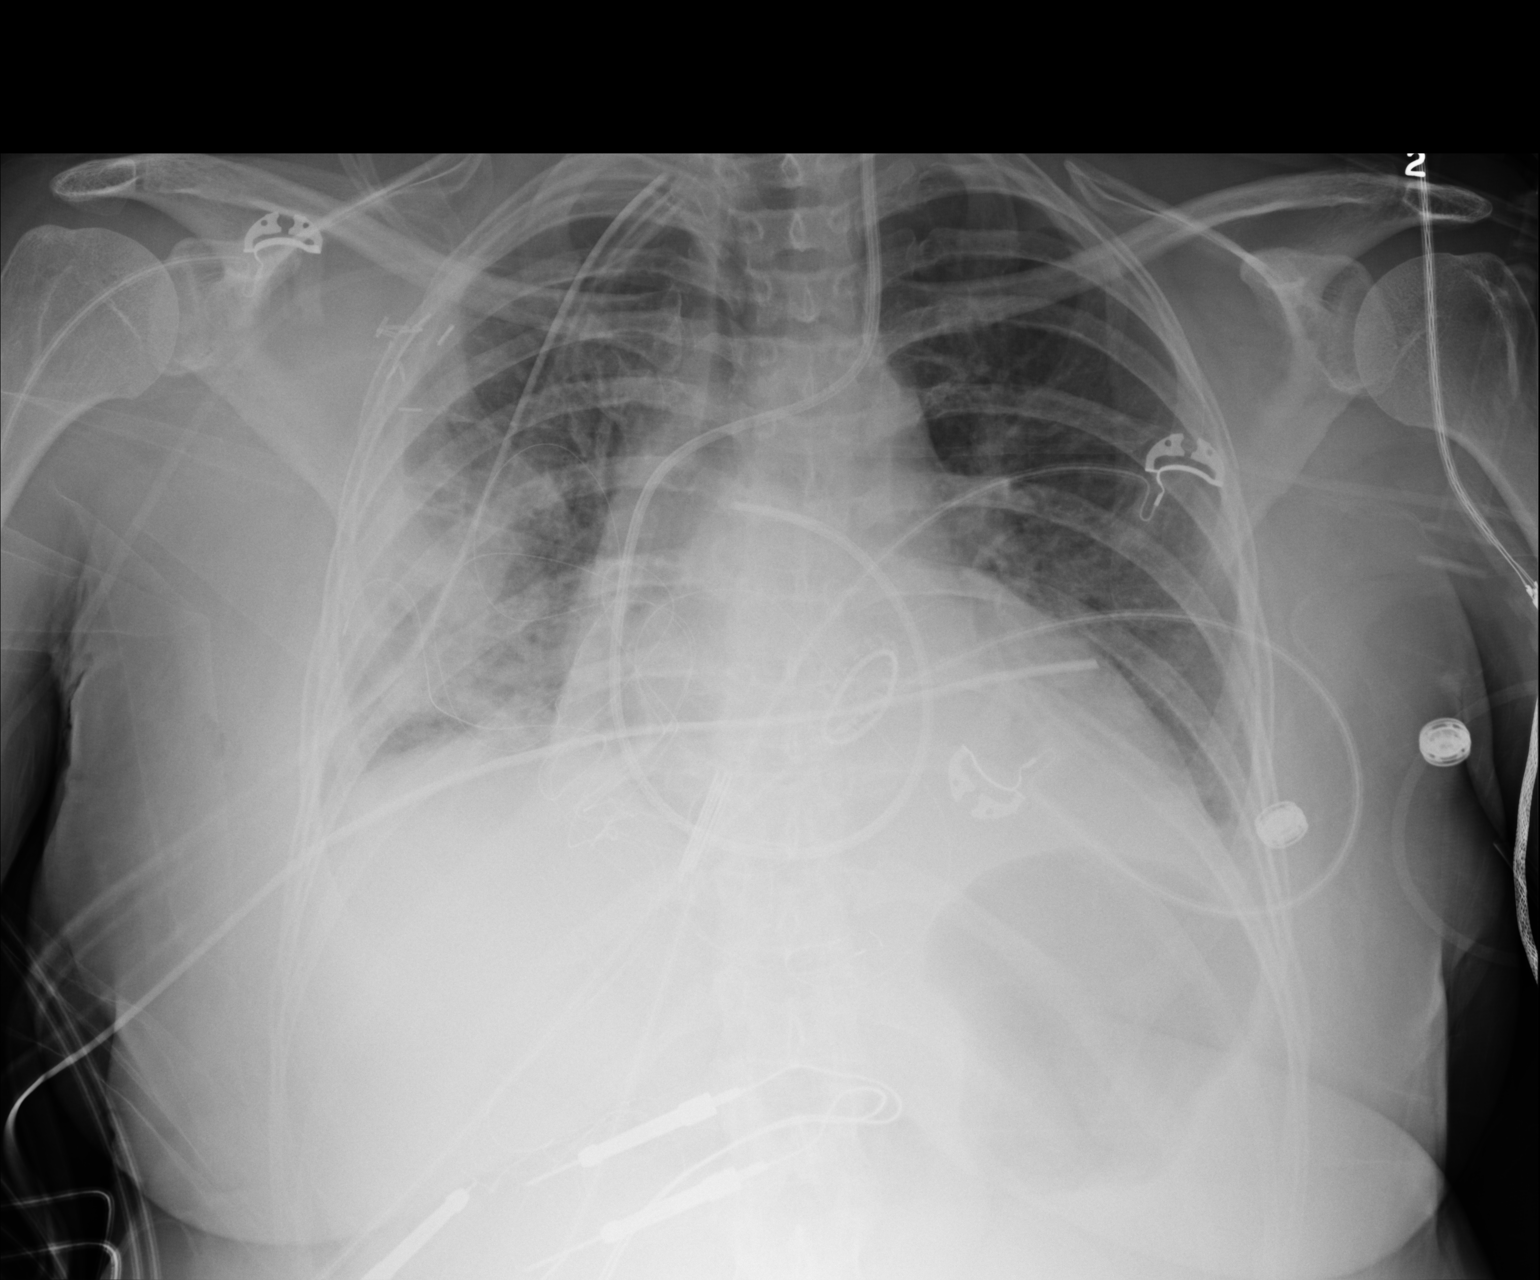

[1 of 1 positions shown; findings below may reference images not displayed]

FINDINGS: Endotracheal tube and nasogastric tube have been removed. Chest
drains are still present. Swan-Ganz catheter is in the right
pulmonary artery. Changes compatible with mitral valve surgery. No
evidence for pneumothorax. Patchy densities in the mid and lower
right chest have minimally changed. There may be a small amount of
pleural based disease or densities along the lateral right chest.
Heart size is mildly enlarged. Decreased aeration at the left lung
base.
IMPRESSION: Decreased aeration at the left lung base most compatible with
atelectasis.

Persistent pleural and parenchymal densities in the right chest,
minimal change.

Support apparatuses as described.  No evidence for pneumothorax.

## 2018-07-03 IMAGING — DX DG CHEST 2V
2 series · 2 of 2 positions shown · non-contrast
Comparison: Radiographs August 29, 2016.

CLINICAL DATA: Chest pain.

EXAM:
CHEST  2 VIEW

[chest pa]
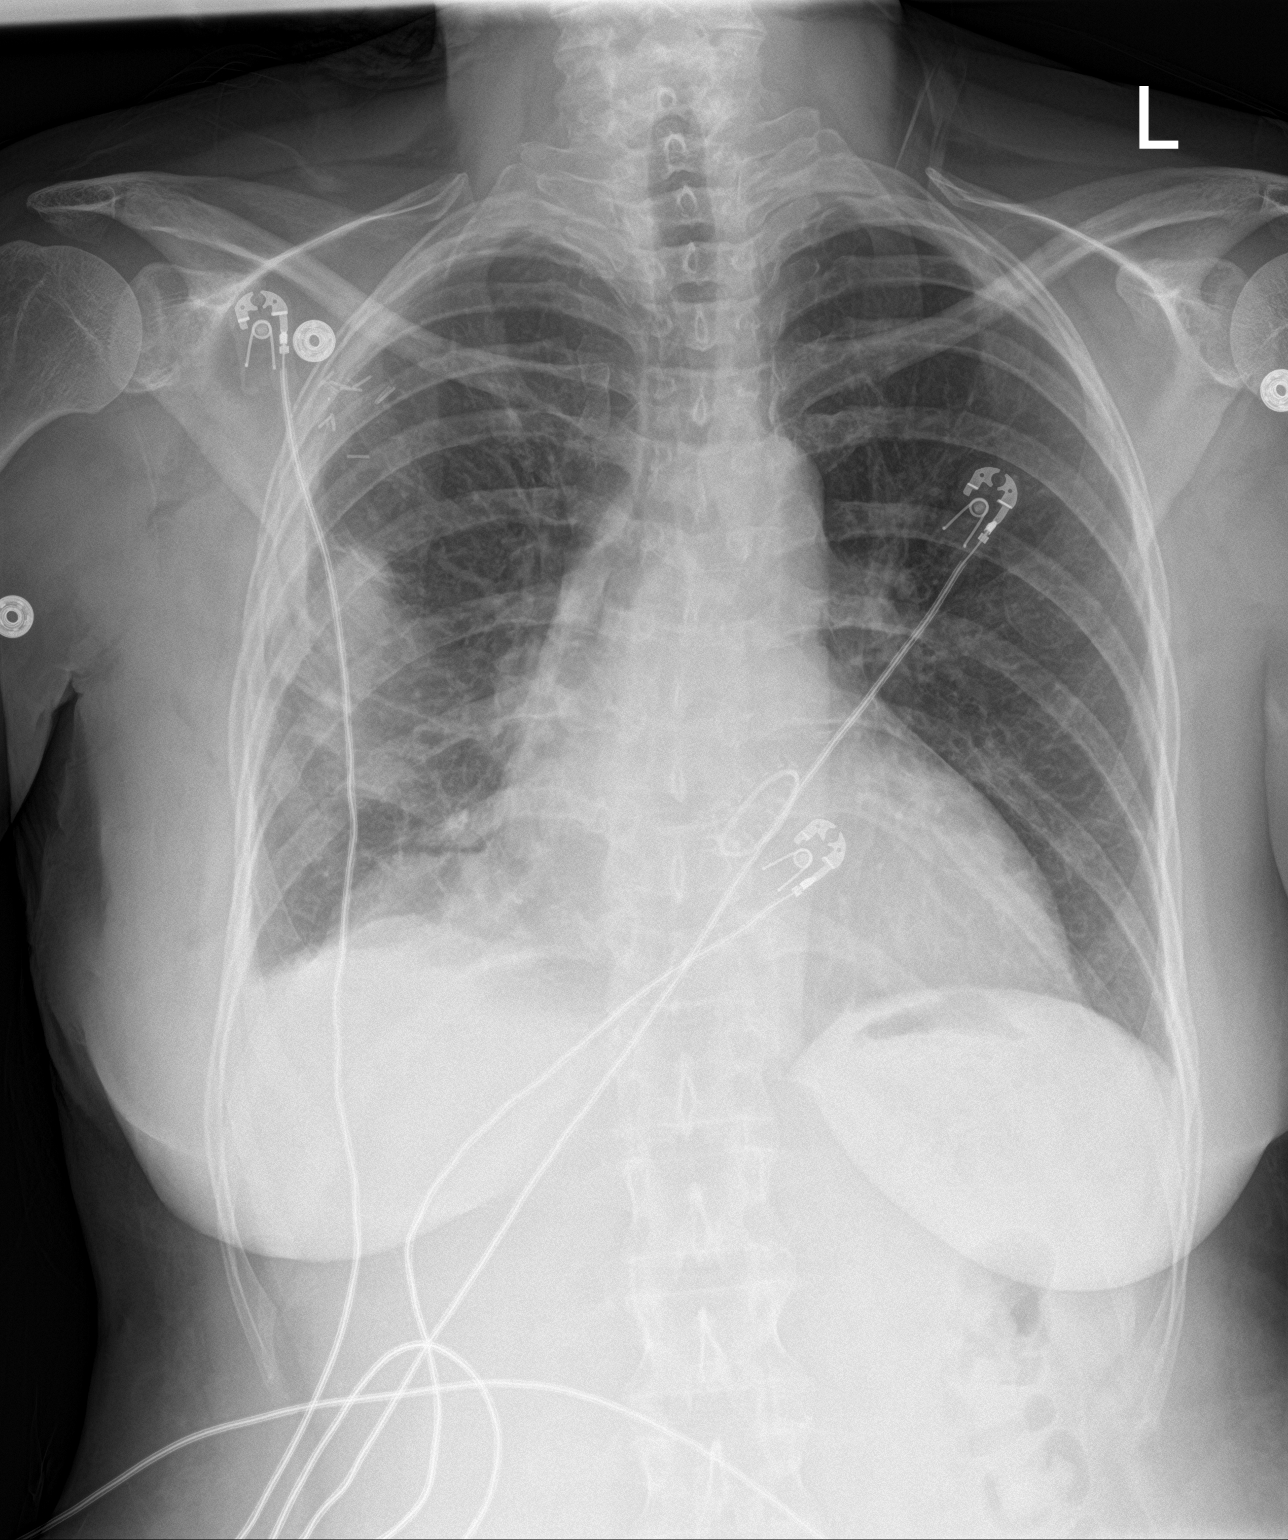

[chest lat]
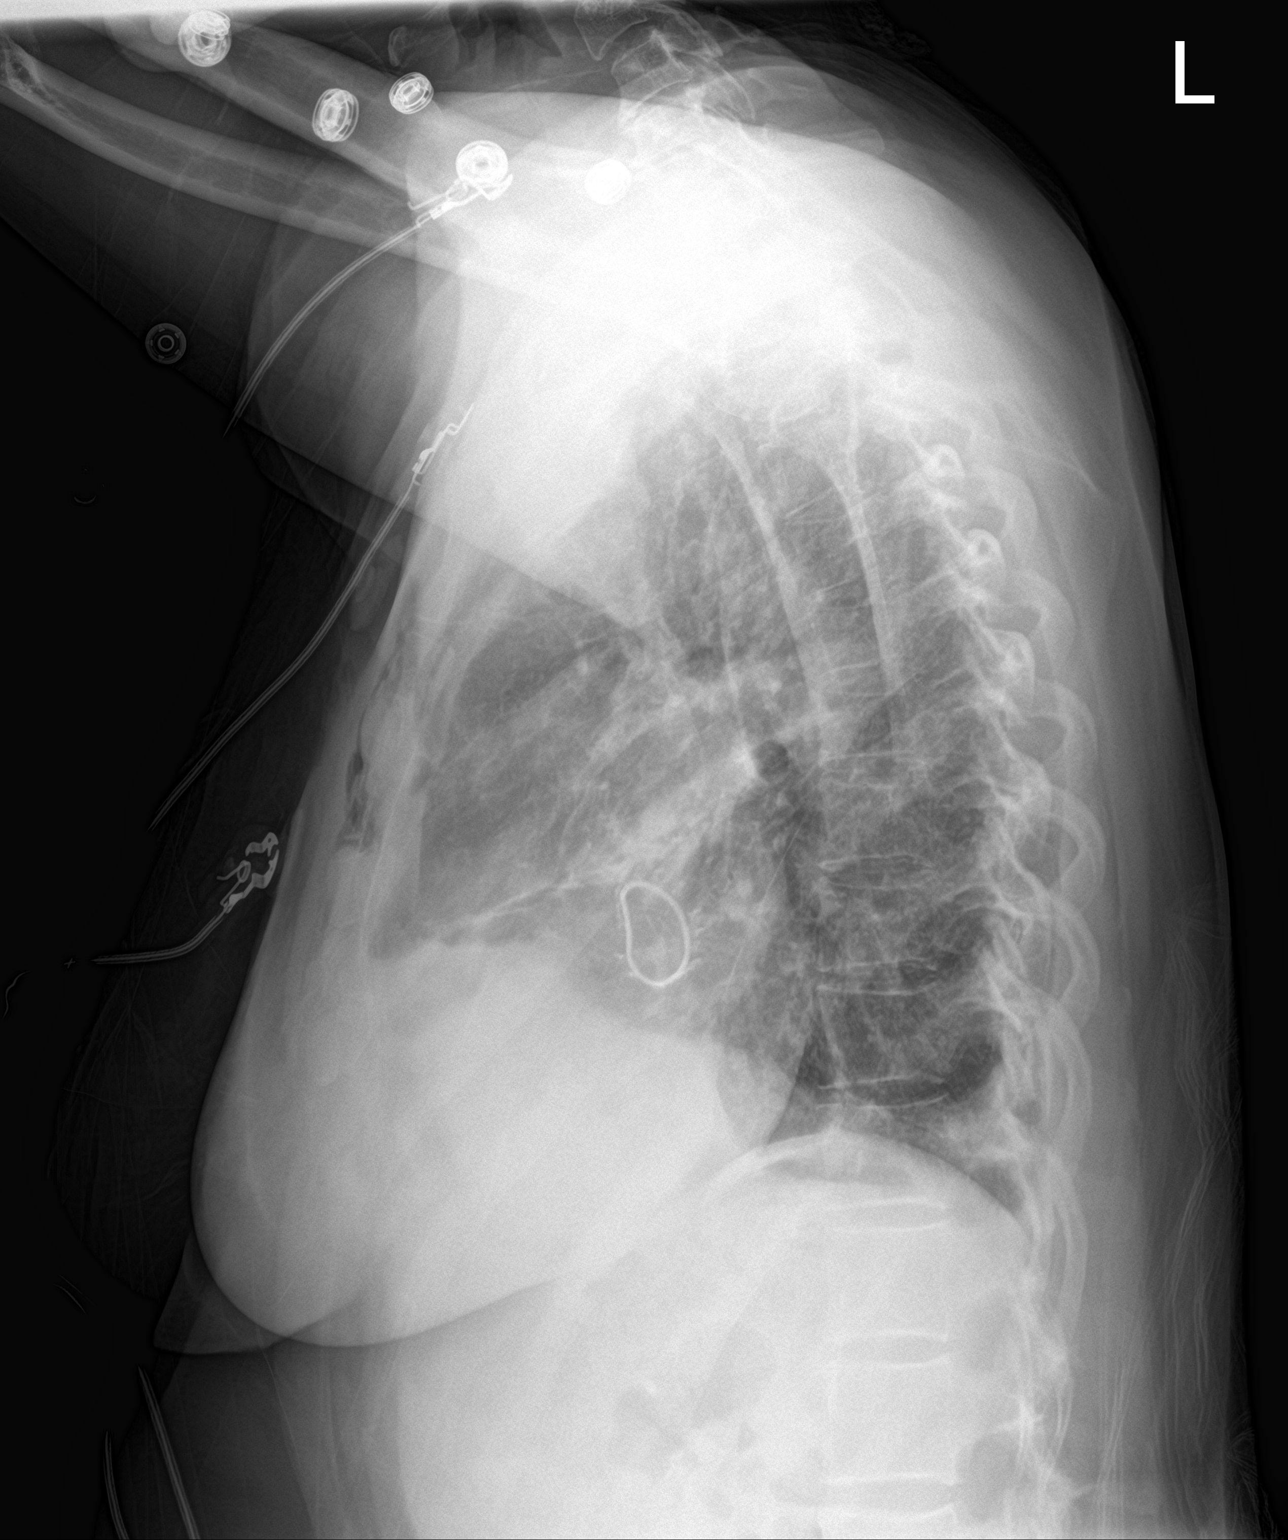

[2 of 2 positions shown; findings below may reference images not displayed]

FINDINGS: Stable cardiomediastinal silhouette. Left lung is clear. Minimal
right apical pneumothorax is noted. Right-sided chest tube has been
removed. Stable density is seen in right midlung concerning for
contusion or possibly atelectasis or infiltrate. Right basilar
atelectasis is noted with minimal right pleural effusion. Right
axillary surgical clips are noted. Status post cardiac valve repair.
IMPRESSION: Right-sided chest tube has been removed with minimal right apical
pneumothorax present. Right midlung atelectasis, infiltrate or
contusion is noted. Mild right basilar atelectasis is also noted
with minimal right pleural effusion.

## 2018-07-22 ENCOUNTER — Encounter (HOSPITAL_COMMUNITY): Payer: Self-pay | Admitting: Emergency Medicine

## 2018-07-22 ENCOUNTER — Observation Stay (HOSPITAL_COMMUNITY)
Admission: EM | Admit: 2018-07-22 | Discharge: 2018-07-23 | Disposition: A | Discharge status: Home / Self Care | Payer: Medicare Other | Attending: Internal Medicine | Admitting: Internal Medicine

## 2018-07-22 ENCOUNTER — Telehealth: Payer: Self-pay | Admitting: Interventional Cardiology

## 2018-07-22 ENCOUNTER — Emergency Department (HOSPITAL_COMMUNITY): Payer: Medicare Other

## 2018-07-22 ENCOUNTER — Other Ambulatory Visit: Payer: Medicare Other

## 2018-07-22 ENCOUNTER — Other Ambulatory Visit: Payer: Self-pay

## 2018-07-22 DIAGNOSIS — Z8249 Family history of ischemic heart disease and other diseases of the circulatory system: Secondary | ICD-10-CM | POA: Diagnosis not present

## 2018-07-22 DIAGNOSIS — Z1159 Encounter for screening for other viral diseases: Secondary | ICD-10-CM | POA: Diagnosis not present

## 2018-07-22 DIAGNOSIS — Z7902 Long term (current) use of antithrombotics/antiplatelets: Secondary | ICD-10-CM | POA: Diagnosis not present

## 2018-07-22 DIAGNOSIS — Z79899 Other long term (current) drug therapy: Secondary | ICD-10-CM | POA: Diagnosis not present

## 2018-07-22 DIAGNOSIS — I252 Old myocardial infarction: Secondary | ICD-10-CM | POA: Diagnosis not present

## 2018-07-22 DIAGNOSIS — Z87891 Personal history of nicotine dependence: Secondary | ICD-10-CM | POA: Diagnosis not present

## 2018-07-22 DIAGNOSIS — I447 Left bundle-branch block, unspecified: Secondary | ICD-10-CM | POA: Diagnosis not present

## 2018-07-22 DIAGNOSIS — Z95810 Presence of automatic (implantable) cardiac defibrillator: Secondary | ICD-10-CM | POA: Insufficient documentation

## 2018-07-22 DIAGNOSIS — I480 Paroxysmal atrial fibrillation: Secondary | ICD-10-CM | POA: Insufficient documentation

## 2018-07-22 DIAGNOSIS — Z952 Presence of prosthetic heart valve: Secondary | ICD-10-CM | POA: Diagnosis not present

## 2018-07-22 DIAGNOSIS — I110 Hypertensive heart disease with heart failure: Secondary | ICD-10-CM | POA: Diagnosis not present

## 2018-07-22 DIAGNOSIS — I5022 Chronic systolic (congestive) heart failure: Secondary | ICD-10-CM | POA: Diagnosis present

## 2018-07-22 DIAGNOSIS — I5042 Chronic combined systolic (congestive) and diastolic (congestive) heart failure: Secondary | ICD-10-CM | POA: Diagnosis not present

## 2018-07-22 DIAGNOSIS — I739 Peripheral vascular disease, unspecified: Secondary | ICD-10-CM | POA: Diagnosis not present

## 2018-07-22 DIAGNOSIS — Z7982 Long term (current) use of aspirin: Secondary | ICD-10-CM | POA: Insufficient documentation

## 2018-07-22 DIAGNOSIS — I2510 Atherosclerotic heart disease of native coronary artery without angina pectoris: Secondary | ICD-10-CM | POA: Diagnosis not present

## 2018-07-22 DIAGNOSIS — Z8673 Personal history of transient ischemic attack (TIA), and cerebral infarction without residual deficits: Secondary | ICD-10-CM | POA: Insufficient documentation

## 2018-07-22 DIAGNOSIS — Z955 Presence of coronary angioplasty implant and graft: Secondary | ICD-10-CM | POA: Insufficient documentation

## 2018-07-22 DIAGNOSIS — R918 Other nonspecific abnormal finding of lung field: Secondary | ICD-10-CM | POA: Diagnosis not present

## 2018-07-22 DIAGNOSIS — I10 Essential (primary) hypertension: Secondary | ICD-10-CM | POA: Diagnosis present

## 2018-07-22 DIAGNOSIS — E785 Hyperlipidemia, unspecified: Secondary | ICD-10-CM | POA: Insufficient documentation

## 2018-07-22 DIAGNOSIS — R079 Chest pain, unspecified: Principal | ICD-10-CM | POA: Diagnosis present

## 2018-07-22 DIAGNOSIS — R072 Precordial pain: Secondary | ICD-10-CM

## 2018-07-22 LAB — BASIC METABOLIC PANEL
Anion gap: 9 (ref 5–15)
BUN: 8 mg/dL (ref 6–20)
CO2: 23 mmol/L (ref 22–32)
Calcium: 8.9 mg/dL (ref 8.9–10.3)
Chloride: 108 mmol/L (ref 98–111)
Creatinine, Ser: 0.87 mg/dL (ref 0.44–1.00)
GFR calc Af Amer: >60 mL/min (ref >60)
GFR calc non Af Amer: >60 mL/min (ref >60)
Glucose, Bld: 75 mg/dL (ref 70–99)
Potassium: 3.8 mmol/L (ref 3.5–5.1)
Sodium: 140 mmol/L (ref 135–145)

## 2018-07-22 LAB — CBC
HCT: 44.3 % (ref 36.0–46.0)
Hemoglobin: 14.3 g/dL (ref 12.0–15.0)
MCH: 29.2 pg (ref 26.0–34.0)
MCHC: 32.3 g/dL (ref 30.0–36.0)
MCV: 90.6 fL (ref 80.0–100.0)
Platelets: 252 10*3/uL (ref 150–400)
RBC: 4.89 MIL/uL (ref 3.87–5.11)
RDW: 14.2 % (ref 11.5–15.5)
WBC: 8.2 10*3/uL (ref 4.0–10.5)
nRBC: 0 % (ref 0.0–0.2)

## 2018-07-22 LAB — SARS CORONAVIRUS 2 BY RT PCR (HOSPITAL ORDER, PERFORMED IN ~~LOC~~ HOSPITAL LAB): SARS Coronavirus 2: NEGATIVE

## 2018-07-22 LAB — TROPONIN I (HIGH SENSITIVITY)
Troponin I (High Sensitivity): 7 ng/L (ref <18)
Troponin I (High Sensitivity): 6 ng/L (ref <18)
Troponin I (High Sensitivity): 8 ng/L (ref <18)
Troponin I (High Sensitivity): 8 ng/L (ref <18)

## 2018-07-22 MED ORDER — CARVEDILOL 6.25 MG PO TABS
6.2500 mg | ORAL_TABLET | Freq: Two times a day (BID) | ORAL | Status: DC
  Administered 2018-07-23: 6.25 mg via ORAL
  Filled 2018-07-22: qty 1

## 2018-07-22 MED ORDER — ACETAMINOPHEN 325 MG PO TABS: 650.0000 mg | ORAL_TABLET | ORAL | Status: DC | PRN

## 2018-07-22 MED ORDER — BEMPEDOIC ACID 180 MG PO TABS: 180.0000 mg | ORAL_TABLET | Freq: Every day | ORAL | Status: DC

## 2018-07-22 MED ORDER — EZETIMIBE 10 MG PO TABS
10.0000 mg | ORAL_TABLET | Freq: Every day | ORAL | Status: DC
  Administered 2018-07-22 – 2018-07-23 (×2): 10 mg via ORAL
  Filled 2018-07-22 (×3): qty 1

## 2018-07-22 MED ORDER — CLOPIDOGREL BISULFATE 75 MG PO TABS
75.0000 mg | ORAL_TABLET | Freq: Every day | ORAL | Status: DC
  Administered 2018-07-22 – 2018-07-23 (×2): 75 mg via ORAL
  Filled 2018-07-22 (×2): qty 1

## 2018-07-22 MED ORDER — ENOXAPARIN SODIUM 40 MG/0.4ML ~~LOC~~ SOLN
40.0000 mg | SUBCUTANEOUS | Status: DC
  Administered 2018-07-22: 22:00:00 40 mg via SUBCUTANEOUS
  Filled 2018-07-22: qty 0.4

## 2018-07-22 MED ORDER — ONDANSETRON HCL 4 MG/2ML IJ SOLN: 4.0000 mg | Freq: Four times a day (QID) | INTRAMUSCULAR | Status: DC | PRN

## 2018-07-22 MED ORDER — ASPIRIN EC 81 MG PO TBEC
81.0000 mg | DELAYED_RELEASE_TABLET | Freq: Every day | ORAL | Status: DC
  Administered 2018-07-23: 81 mg via ORAL
  Filled 2018-07-22: qty 1

## 2018-07-22 MED ORDER — ATORVASTATIN CALCIUM 80 MG PO TABS
80.0000 mg | ORAL_TABLET | Freq: Every day | ORAL | Status: DC
  Administered 2018-07-22 – 2018-07-23 (×2): 80 mg via ORAL
  Filled 2018-07-22 (×2): qty 1

## 2018-07-22 MED ORDER — NITROGLYCERIN 0.4 MG SL SUBL
0.4000 mg | SUBLINGUAL_TABLET | SUBLINGUAL | Status: DC | PRN
  Administered 2018-07-22: 0.4 mg via SUBLINGUAL
  Filled 2018-07-22: qty 1

## 2018-07-22 MED ORDER — ASPIRIN 325 MG PO TABS
325.0000 mg | ORAL_TABLET | Freq: Once | ORAL | Status: AC
  Administered 2018-07-22: 325 mg via ORAL
  Filled 2018-07-22: qty 1

## 2018-07-22 MED ORDER — LOSARTAN POTASSIUM 25 MG PO TABS
12.5000 mg | ORAL_TABLET | Freq: Every day | ORAL | Status: DC
  Administered 2018-07-22 – 2018-07-23 (×2): 12.5 mg via ORAL
  Filled 2018-07-22: qty 0.5
  Filled 2018-07-22 (×2): qty 1

## 2018-07-22 MED ORDER — FUROSEMIDE 40 MG PO TABS: 40.0000 mg | ORAL_TABLET | Freq: Every day | ORAL | Status: DC | PRN

## 2018-07-22 MED ORDER — ACETAMINOPHEN 500 MG PO TABS: 1000.0000 mg | ORAL_TABLET | Freq: Four times a day (QID) | ORAL | Status: DC | PRN

## 2018-07-22 NOTE — Telephone Encounter (Signed)
Pt in ED.  

## 2018-07-22 NOTE — H&P (Addendum)
History and Physical    [name] HQI:[id number] DOB: 01-10-1965 DOA: 07/22/2018  PCP: Montez Hageman, DO  Patient coming from: Home  I have personally briefly reviewed patient's old medical records in [facility] Link  Chief Complaint: CP  HPI: Lindsay Camacho is a 53 y.o. female with medical history significant of HTN, NSTEMI s/p DES to CFX, NICM, BIV/ICD placement, MR s/p repair, PAD  Patient presents to the ED with c/o CP.  Onset last night while asleep and woke her from sleeping.  Pressure like sensation.  Was severe earlier in day though improved at this point.  Associated SOB earlier in day though none now.  Patient presented to ED.   ED Course: CP improved at present time.  Trop neg x2 thus far.  EKG shows LBBB and V pacing (chronic).  Hospitalist asked to admit.  No current SOB though had some earlier in day.  No cough, no fever, no chills.   Review of Systems: As per HPI, otherwise all review of systems negative.  Past Medical History:  Diagnosis Date  . Acute myocardial infarction of other lateral wall, initial episode of care 01/2013   DES CFX  . Aortoiliac occlusive disease (HCC) 08/22/2016  . CAD (coronary artery disease) 11/13/2011   50% ? proximal LCx stenosis per Cath at [facility]  . Chest pain, atypical, muscular skelatal   01/09/2014  . Dyspnea   . Heart failure (HCC)   . HLD (hyperlipidemia)   . Hypertension   . Intermediate coronary syndrome (HCC)   . LBBB (left bundle branch block)   . Mitral regurgitation   . Nonischemic cardiomyopathy (HCC) 01/26/2017  . PVD (peripheral vascular disease) (HCC)    Right CFA stenosis per report on 11/14/2011  . 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/25/2016   Edwards McArthy-Adams IMR ETLogix ring annuloplasty (Model 4100, Serial [id number], size 26) placed via right mini thoracotomy approach  . Stenosis of left subclavian artery (HCC) 08/22/2016  . Stroke [facility])    tia with no deficits    Past Surgical History:   Procedure Laterality Date  . APPENDECTOMY    . BIV ICD INSERTION CRT-D N/A 01/24/2017   Procedure: BIV ICD INSERTION CRT-D;  Surgeon: Marinus Maw, MD;  Location: [facility] INVASIVE CV LAB;  Service: Cardiovascular;  Laterality: N/A;  . CARDIAC CATHETERIZATION  04/14/2013   Non-obstructive disease, patent CFX stent  . CARDIAC CATHETERIZATION N/A 09/21/2014   Procedure: Left Heart Cath and Coronary Angiography;  Surgeon: Lennette Bihari, MD;  Location: [facility] INVASIVE CV LAB;  Service: Cardiovascular;  Laterality: N/A;  . CORONARY ANGIOPLASTY WITH STENT PLACEMENT  01/17/2013   mild disease except 99% CFX, rx with  2.5 x 28 Alpine drug-eluting stent   . GROIN DEBRIDEMENT Right 04/14/2013   Procedure: Emergency Evacuation of Retroperitoneal Hematoma and Repair Right External Iliac Artery Pseudoaneurysm    ;  Surgeon: Sherren Kerns, MD;  Location: [facility] OR;  Service: Vascular;  Laterality: Right;  . LEFT HEART CATHETERIZATION WITH CORONARY ANGIOGRAM N/A 01/18/2013   Procedure: LEFT HEART CATHETERIZATION WITH CORONARY ANGIOGRAM;  Surgeon: Corky Crafts, MD;  Location: [facility] CATH LAB;  Service: Cardiovascular;  Laterality: N/A;  . LEFT HEART CATHETERIZATION WITH CORONARY ANGIOGRAM N/A 04/14/2013   Procedure: LEFT HEART CATHETERIZATION WITH CORONARY ANGIOGRAM;  Surgeon: Corky Crafts, MD;  Location[facility] CATH LAB;  Service: Cardiovascular;  Laterality: N/A;  . MITRAL VALVE REPAIR Right 08/25/2016   Procedure: MINIMALLY INVASIVE MITRAL VALVE REPAIR;  Surgeon: Purcell Nailswen, Clarence H, MD;  [facility];  Service: Open Heart Surgery;  Laterality: Right;  . MULTIPLE EXTRACTIONS WITH ALVEOLOPLASTY N/A 08/23/2016   Procedure: Extraction of tooth #'s 17, 20-23, 26-29 with alveoloplasty;  Surgeon: Charlynne PanderKulinski, Ronald F, DDS;  [facility];  Service: Oral Surgery;  Laterality: N/A;  . PERCUTANEOUS CORONARY STENT INTERVENTION (PCI-S)  01/18/2013   Procedure: PERCUTANEOUS CORONARY STENT INTERVENTION (PCI-S);  Surgeon:  Corky CraftsJayadeep S Varanasi, MD;  [facility];  Service: Cardiovascular;;  . RIGHT/LEFT HEART CATH AND CORONARY ANGIOGRAPHY N/A 08/21/2016   Procedure: RIGHT/LEFT HEART CATH AND CORONARY ANGIOGRAPHY;  Surgeon: Kathleene HazelMcAlhany, Christopher D, MD;  Location: [facility] INVASIVE CV LAB;  Service: Cardiovascular;  Laterality: N/A;  . TEE WITHOUT CARDIOVERSION N/A 03/06/2014   Procedure: TRANSESOPHAGEAL ECHOCARDIOGRAM (TEE);  Surgeon: Lars MassonKatarina H Nelson, MD;  [facility];  Service: Cardiovascular;  Laterality: N/A;  . TEE WITHOUT CARDIOVERSION N/A 08/18/2016   Procedure: TRANSESOPHAGEAL ECHOCARDIOGRAM (TEE);  Surgeon: Lewayne Buntingrenshaw, Brian S, MD;  [facility];  Service: Cardiovascular;  Laterality: N/A;  . TEE WITHOUT CARDIOVERSION N/A 08/25/2016   Procedure: TRANSESOPHAGEAL ECHOCARDIOGRAM (TEE);  Surgeon: Purcell Nailswen, Clarence H, MD;  [facility];  Service: Open Heart Surgery;  Laterality: N/A;  . TUBAL LIGATION       reports that she quit smoking about 2 years ago. Her smoking use included cigarettes. She smoked 0.50 packs per day. She has never used smokeless tobacco. She reports current drug use. Drug: Marijuana. She reports that she does not drink alcohol.  Allergies  Allergen Reactions  . Aldomet [Methyldopa] Other (See Comments)    Severe hypotension  . Brilinta [Ticagrelor] Shortness Of Breath  . Other Other (See Comments)    Nuclear Stress Test Medication caused seizures  . Coumadin [Warfarin Sodium] Swelling    Arm swelled  . Eliquis [Apixaban] Other (See Comments)    BP got too low  . Penicillins Rash    Has patient had a PCN reaction causing immediate rash, facial/tongue/throat swelling, SOB or lightheadedness with hypotension: Yes Has patient had a PCN reaction causing severe rash involving mucus membranes or skin necrosis: No Has patient had a PCN reaction that required hospitalization: No Has patient had a PCN reaction occurring within the last 10 years: No If all of the above  answers are "NO", then may proceed with Cephalosporin use.     Family History  Problem Relation Age of Onset  . CAD Mother [id number]  . Lung cancer Mother   . Bladder Cancer Mother   . Stroke Mother   . Heart disease Mother        Before age 160  . Hypertension Mother   . Heart attack Mother   . CAD Father [id number]  . Heart disease Father        Before age 53  . Heart attack Father   . Stroke Father        Bleeding stroke     Prior to Admission medications   Medication Sig Start Date End Date Taking? Authorizing Provider  acetaminophen (TYLENOL) 500 MG tablet Take 1,000 mg by mouth every 6 (six) hours as needed for headache (pain).    [provider]  aspirin EC 81 MG tablet Take 81 mg by mouth daily.    [provider]  atorvastatin (LIPITOR) 80 MG tablet Take 1 tablet (80 mg total) by mouth daily. 05/16/18   Robbie LisSimmons, Brittainy M, PA-C  Bempedoic Acid (NEXLETOL) 180 MG TABS Take 180 mg by  mouth daily.    [provider]  carvedilol (COREG) 6.25 MG tablet Take 6.25 mg by mouth 2 (two) times daily with a meal.    [provider]  clopidogrel (PLAVIX) 75 MG tablet TAKE 1 TABLET BY MOUTH DAILY WITH BREAKFAST 05/28/18   Corky CraftsVaranasi, Jayadeep S, MD  ezetimibe (ZETIA) 10 MG tablet Take 1 tablet (10 mg total) by mouth daily. 03/29/17   Robbie LisSimmons, Brittainy M, PA-C  furosemide (LASIX) 40 MG tablet Take 40 mg by mouth daily as needed for fluid or edema (shortness of breath).     [provider]  losartan (COZAAR) 25 MG tablet Take 0.5 tablets (12.5 mg total) by mouth daily. 04/30/18   Corky CraftsVaranasi, Jayadeep S, MD    Physical Exam: Vitals:   07/22/18 1136 07/22/18 1627 07/22/18 1730 07/22/18 1745  BP: (!) 144/61 124/83 (!) 161/108 133/84  Pulse: 80 68 62   Resp: 16 12 12 16   Temp: 98.2 F (36.8 C)     TempSrc: Oral     SpO2: 98% 100% 100%   Weight:      Height:        Constitutional: NAD, calm, comfortable Eyes: PERRL, lids and conjunctivae normal ENMT:  Mucous membranes are moist. Posterior pharynx clear of any exudate or lesions.Normal dentition.  Neck: normal, supple, no masses, no thyromegaly Respiratory: clear to auscultation bilaterally, no wheezing, no crackles. Normal respiratory effort. No accessory muscle use.  Cardiovascular: Regular rate and rhythm, no murmurs / rubs / gallops. No extremity edema. 2+ pedal pulses. No carotid bruits.  Abdomen: no tenderness, no masses palpated. No hepatosplenomegaly. Bowel sounds positive.  Musculoskeletal: no clubbing / cyanosis. No joint deformity upper and lower extremities. Good ROM, no contractures. Normal muscle tone.  Skin: no rashes, lesions, ulcers. No induration Neurologic: CN 2-12 grossly intact. Sensation intact, DTR normal. Strength 5/5 in all 4.  Psychiatric: Normal judgment and insight. Alert and oriented x 3. Normal mood.    Labs on Admission: I have personally reviewed following labs and imaging studies  CBC: Recent Labs  Lab 07/22/18 1204  WBC 8.2  HGB 14.3  HCT 44.3  MCV 90.6  PLT 252   Basic Metabolic Panel: Recent Labs  Lab 07/22/18 1204  NA 140  K 3.8  CL 108  CO2 23  GLUCOSE 75  BUN 8  CREATININE 0.87  CALCIUM 8.9   GFR: Estimated Creatinine Clearance: 59.1 mL/min (by C-G formula based on SCr of 0.87 mg/dL). Liver Function Tests: No results for input(s): AST, ALT, ALKPHOS, BILITOT, PROT, ALBUMIN in the last 168 hours. No results for input(s): LIPASE, AMYLASE in the last 168 hours. No results for input(s): AMMONIA in the last 168 hours. Coagulation Profile: No results for input(s): INR, PROTIME in the last 168 hours. Cardiac Enzymes: No results for input(s): CKTOTAL, CKMB, CKMBINDEX, TROPONINI in the last 168 hours. BNP (last 3 results) No results for input(s): PROBNP in the last 8760 hours. HbA1C: No results for input(s): HGBA1C in the last 72 hours. CBG: No results for input(s): GLUCAP in the last 168 hours. Lipid Profile: No results for  input(s): CHOL, HDL, LDLCALC, TRIG, CHOLHDL, LDLDIRECT in the last 72 hours. Thyroid Function Tests: No results for input(s): TSH, T4TOTAL, FREET4, T3FREE, THYROIDAB in the last 72 hours. Anemia Panel: No results for input(s): VITAMINB12, FOLATE, FERRITIN, TIBC, IRON, RETICCTPCT in the last 72 hours. Urine analysis:    Component Value Date/Time   COLORURINE YELLOW 08/23/2016 1631   APPEARANCEUR CLEAR 08/23/2016 1631  LABSPEC 1.007 08/23/2016 1631   PHURINE 7.0 08/23/2016 1631   GLUCOSEU >=500 (A) 08/23/2016 1631   HGBUR NEGATIVE 08/23/2016 1631   BILIRUBINUR NEGATIVE 08/23/2016 1631   KETONESUR NEGATIVE 08/23/2016 1631   PROTEINUR NEGATIVE 08/23/2016 1631   UROBILINOGEN 1.0 09/24/2014 1140   NITRITE NEGATIVE 08/23/2016 1631   LEUKOCYTESUR NEGATIVE 08/23/2016 1631    Radiological Exams on Admission: Dg Chest 2 View  Result Date: 07/22/2018 CLINICAL DATA:  Cough.  Soreness. EXAM: CHEST - 2 VIEW COMPARISON:  01/25/2017. FINDINGS: Cardiac pacer in stable position. Prior cardiac valve replacement. Heart size stable. Mild right base atelectasis/infiltrate. No pleural effusion or pneumothorax. Surgical clips right chest. IMPRESSION: 1. Cardiac pacer in stable position. Prior cardiac valve replacement. Stable cardiomegaly. 2.  Mild right base atelectasis/infiltrate. Electronically Signed   By: Marcello Moores  Register   On: 07/22/2018 12:19    EKG: Independently reviewed.  Assessment/Plan Principal Problem:   Chest pain, rule out acute myocardial infarction Active Problems:   HTN (hypertension)   CAD - CFX DES 01/17/13   Chronic combined systolic (congestive) and diastolic (congestive) heart failure (HCC)    1. CP r/o - 1. CP obs pathway 2. Serial trops 3. Tele monitor 4. NPO after MN 5. ASA 325 now 6. NTG SL PRN ordered 7. Cards eval in AM 2. HTN - continue home BP meds 3. CAD - 1. Continue statin 2. Continue ASA 3. Continue plavix if still taking 4. Chronic CHF - 1. Continue  home meds / lasix 2. Interrogating PPM now.  DVT prophylaxis: Lovenox Code Status: Full Family Communication: No family in room Disposition Plan: Home after admit Consults called: Message sent to P. Abagail Kitchens for AM cards eval Admission status: Place in obs  Bode Pieper, [facility]  How to contact the [facility] Attending or Consulting provider [place] or covering provider during after hours [place], for this patient?  1. Check the care team in [facility] and look for a) attending/consulting [facility] provider listed and b) the [facility] team listed 2. Log into [email or web address]  Amion Physician Scheduling and messaging for groups and whole hospitals  On call and physician scheduling software for group practices, residents, hospitalists and other medical providers for call, clinic, rotation and shift schedules. On[employer] is a hospital-wide system for scheduling doctors and paging doctors on call. EasyPlot is for scientific plotting and data analysis.  [email or web address]  and use [facility]'s universal password to access. If you do not have the password, please contact the hospital operator.  3. Locate the [facility] provider you are looking for under [facility] and page to a number that you can be directly reached. 4. If you still have difficulty reaching the provider, please page the [facility] (Director on Call) for the Hospitalists listed on amion for assistance.  07/22/2018, 7:41 PM

## 2018-07-22 NOTE — ED Notes (Signed)
Pacemaker integrated. [employer] called, received report. Notified MD.

## 2018-07-22 NOTE — ED Notes (Signed)
At the desk yelling about her wait.  Explained acuity.  Rechecked vitals at this time.  Explained there would be one more in front of her.

## 2018-07-22 NOTE — Telephone Encounter (Signed)
New Message    Pt c/o of Chest Pain: STAT if CP now or developed within 24 hours  1. Are you having CP right now?  Yes   2. Are you experiencing any other symptoms (ex. SOB, nausea, vomiting, sweating)? Back pain, SOB  3. How long have you been experiencing CP? Since Last Night  4. Is your CP continuous or coming and going? continuous  5. Have you taken Nitroglycerin? NO ?    Patient at the ER right now.

## 2018-07-22 NOTE — ED Triage Notes (Signed)
Pt reports chest pain that started last night while she was sleeping. Pt states the pain feels the same as when she had her heart attack.

## 2018-07-22 NOTE — ED Notes (Signed)
ED TO INPATIENT HANDOFF REPORT  ED Nurse Name and Phone #: Tray SwazilandJordan, [phone]  S Name/Age/Gender Lindsay Camacho 53 y.o. female Room/Bed: 034C/034C  Code Status   Code Status: Full Code  Home/SNF/Other Home Patient oriented to: self, place, time and situation Is this baseline? Yes   Triage Complete: Triage complete  Chief Complaint CHEST PAIN  Triage Note Pt reports chest pain that started last night while she was sleeping. Pt states the pain feels the same as when she had her heart attack.    Allergies Allergies  Allergen Reactions  . Aldomet [Methyldopa] Other (See Comments)    Severe hypotension  . Brilinta [Ticagrelor] Shortness Of Breath  . Other Other (See Comments)    Nuclear Stress Test Medication caused seizures  . Coumadin [Warfarin Sodium] Swelling    Arm swelled  . Eliquis [Apixaban] Other (See Comments)    BP got too low  . Penicillins Rash    Has patient had a PCN reaction causing immediate rash, facial/tongue/throat swelling, SOB or lightheadedness with hypotension: Yes Has patient had a PCN reaction causing severe rash involving mucus membranes or skin necrosis: No Has patient had a PCN reaction that required hospitalization: No Has patient had a PCN reaction occurring within the last 10 years: No If all of the above answers are "NO", then may proceed with Cephalosporin use.     Level of Care/Admitting Diagnosis ED Disposition    ED Disposition Condition Comment   Admit  [facility] [[id number]]  Level of Care: Telemetry Cardiac [103]  I expect the patient will be discharged within 24 hours: Yes  LOW acuity---Tx typically complete <24 hrs---ACUTE conditions typically can be evaluated <24 hours---LABS likely to return to acceptable levels <24 hours---IS near functional baseline---EXPECTED to return to current living arrangement---NOT newly hypoxic: Meets criteria for 5C-Observation unit  Covid Evaluation: Asymptomatic  Screening Protocol (No Symptoms)  Diagnosis: Chest pain, rule out acute myocardial infarction [[id number]][[id number]]  Admitting Physician: Hillary BowGARDNER, JARED M [[id number]]  Attending Physician: Hillary BowGARDNER, JARED M [[id number]]  PT Class (Do Not Modify): Observation [104]  PT Acc Code (Do Not Modify): Observation [10022]       B Medical/Surgery History Past Medical History:  Diagnosis Date  . Acute myocardial infarction of other lateral wall, initial episode of care 01/2013   DES CFX  . Aortoiliac occlusive disease (HCC) 08/22/2016  . CAD (coronary artery disease) 11/13/2011   50% ? proximal LCx [facility]  . Chest pain, atypical, muscular skelatal   01/09/2014  . Dyspnea   . Heart failure (HCC)   . HLD (hyperlipidemia)   . Hypertension   . Intermediate coronary syndrome (HCC)   . LBBB (left bundle branch block)   . Mitral regurgitation   . Nonischemic cardiomyopathy (HCC) 01/26/2017  . PVD (peripheral vascular disease) (HCC)    Right CFA stenosis per report on 11/14/2011  . 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/25/2016   Edwards McArthy-Adams IMR ETLogix ring annuloplasty (Model 4100, Serial [id number], size 26) placed via right mini thoracotomy approach  . Stenosis of left subclavian artery (HCC) 08/22/2016  . Stroke [facility](HCC)    tia with no deficits   Past Surgical History:  Procedure Laterality Date  . APPENDECTOMY    . BIV ICD INSERTION CRT-D N/A 01/24/2017   Procedure: BIV ICD INSERTION CRT-D;  Surgeon: Marinus Mawaylor, Gregg W, MD;  [facility];  Service: Cardiovascular;  Laterality: N/A;  . CARDIAC CATHETERIZATION  04/14/2013   Non-obstructive disease, patent CFX stent  . CARDIAC CATHETERIZATION N/A 09/21/2014   Procedure: Left Heart Cath and Coronary Angiography;  Surgeon: Lennette Biharihomas A Kelly, MD;  [facility];  Service: Cardiovascular;  Laterality: N/A;  . CORONARY ANGIOPLASTY WITH STENT PLACEMENT  01/17/2013   mild disease except 99% CFX, rx with  2.5 x 28 Alpine  drug-eluting stent   . GROIN DEBRIDEMENT Right 04/14/2013   Procedure: Emergency Evacuation of Retroperitoneal Hematoma and Repair Right External Iliac Artery Pseudoaneurysm    ;  Surgeon: Sherren Kernsharles E Fields, MD;  [facility];  Service: Vascular;  Laterality: Right;  . LEFT HEART CATHETERIZATION WITH CORONARY ANGIOGRAM N/A 01/18/2013   Procedure: LEFT HEART CATHETERIZATION WITH CORONARY ANGIOGRAM;  Surgeon: Corky CraftsJayadeep S Varanasi, MD;  [facility];  Service: Cardiovascular;  Laterality: N/A;  . LEFT HEART CATHETERIZATION WITH CORONARY ANGIOGRAM N/A 04/14/2013   Procedure: LEFT HEART CATHETERIZATION WITH CORONARY ANGIOGRAM;  Surgeon: Corky CraftsJayadeep S Varanasi, MD;  [facility];  Service: Cardiovascular;  Laterality: N/A;  . MITRAL VALVE REPAIR Right 08/25/2016   Procedure: MINIMALLY INVASIVE MITRAL VALVE REPAIR;  Surgeon: Purcell Nailswen, Clarence H, MD;  [facility];  Service: Open Heart Surgery;  Laterality: Right;  . MULTIPLE EXTRACTIONS WITH ALVEOLOPLASTY N/A 08/23/2016   Procedure: Extraction of tooth #'s 17, 20-23, 26-29 with alveoloplasty;  Surgeon: Charlynne PanderKulinski, Ronald F, DDS;  [facility];  Service: Oral Surgery;  Laterality: N/A;  . PERCUTANEOUS CORONARY STENT INTERVENTION (PCI-S)  01/18/2013   Procedure: PERCUTANEOUS CORONARY STENT INTERVENTION (PCI-S);  Surgeon: Corky CraftsJayadeep S Varanasi, MD;  [facility];  Service: Cardiovascular;;  . RIGHT/LEFT HEART CATH AND CORONARY ANGIOGRAPHY N/A 08/21/2016   Procedure: RIGHT/LEFT HEART CATH AND CORONARY ANGIOGRAPHY;  Surgeon: Kathleene HazelMcAlhany, Christopher D, MD;  [facility] INV[facility] CV LAB;  Service: Cardiovascular;  Laterality: N/A;  . TEE WITHOUT CARDIOVERSION N/A 03/06/2014   Procedure: TRANSESOPHAGEAL ECHOCARDIOGRAM (TEE);  Surgeon: Lars MassonKatarina H Nelson, MD;  [facility];  Service: Cardiovascular;  Laterality: N/A;  . TEE WITHOUT CARDIOVERSION N/A 08/18/2016   Procedure: TRANSESOPHAGEAL ECHOCARDIOGRAM (TEE);  Surgeon: Lewayne Buntingrenshaw, Brian S, MD;   [facility];  Service: Cardiovascular;  Laterality: N/A;  . TEE WITHOUT CARDIOVERSION N/A 08/25/2016   Procedure: TRANSESOPHAGEAL ECHOCARDIOGRAM (TEE);  Surgeon: Purcell Nailswen, Clarence H, MD;  [facility];  Service: Open Heart Surgery;  Laterality: N/A;  . TUBAL LIGATION       A IV Location/Drains/Wounds Patient Lines/Drains/Airways Status   Active Line/Drains/Airways    Name:   Placement date:   Placement time:   Site:   Days:   Peripheral IV 07/22/18 Right Forearm   07/22/18    1738    Forearm   less than 1   Incision (Closed) 08/25/16 Chest Other (Comment)   08/25/16    1222     696   Incision (Closed) 01/24/17 Chest Left   01/24/17    1900     544          Intake/Output Last 24 hours No intake or output data in the 24 hours ending 07/22/18 2048  Labs/Imaging Results for orders placed or performed during the hospital encounter of 07/22/18 (from the past 48 hour(s))  Basic metabolic panel     Status: None   Collection Time: 07/22/18 12:04 PM  Result Value Ref Range   Sodium 140 135 - 145 mmol/L   Potassium 3.8 3.5 - 5.1 mmol/L   Chloride 108 98 - 111 mmol/L  CO2 23 22 - 32 mmol/L   Glucose, Bld 75 70 - 99 mg/dL   BUN 8 6 - 20 mg/dL   Creatinine, Ser 0.87 0.44 - 1.00 mg/dL   Calcium 8.9 8.9 - 10.3 mg/dL   GFR calc non Af Amer >60 >60 mL/min   GFR calc Af Amer >60 >60 mL/min   Anion gap 9 5 - 15    Comment: Performed at [facility] [address], [place], [place] [address]  CBC     Status: None   Collection Time: 07/22/18 12:04 PM  Result Value Ref Range   WBC 8.2 4.0 - 10.5 K/uL   RBC 4.89 3.87 - 5.11 MIL/uL   Hemoglobin 14.3 12.0 - 15.0 g/dL   HCT 44.3 36.0 - 46.0 %   MCV 90.6 80.0 - 100.0 fL   MCH 29.2 26.0 - 34.0 pg   MCHC 32.3 30.0 - 36.0 g/dL   RDW 14.2 11.5 - 15.5 %   Platelets 252 150 - 400 K/uL   nRBC 0.0 0.0 - 0.2 %    Comment: Performed at [facility], [place] [address] [place], [place] [address]  Troponin I (High Sensitivity)      Status: None   Collection Time: 07/22/18 12:04 PM  Result Value Ref Range   Troponin I (High Sensitivity) 7 <18 ng/L    Comment: (NOTE) Elevated high sensitivity troponin I (hsTnI) values and significant  changes across serial measurements may suggest ACS but many other  chronic and acute conditions are known to elevate hsTnI results.  Refer to the "Links" section for chest pain algorithms and additional  guidance. Performed at [facility], [place] [address] [place], [place] [address]   Troponin I (High Sensitivity)     Status: None   Collection Time: 07/22/18  5:34 PM  Result Value Ref Range   Troponin I (High Sensitivity) 6 <18 ng/L    Comment: (NOTE) Elevated high sensitivity troponin I (hsTnI) values and significant  changes across serial measurements may suggest ACS but many other  chronic and acute conditions are known to elevate hsTnI results.  Refer to the "Links" section for chest pain algorithms and additional  guidance. Performed at [facility], [place] [address] [place], [place] [address]    Dg Chest 2 View  Result Date: 07/22/2018 CLINICAL DATA:  Cough.  Soreness. EXAM: CHEST - 2 VIEW COMPARISON:  01/25/2017. FINDINGS: Cardiac pacer in stable position. Prior cardiac valve replacement. Heart size stable. Mild right base atelectasis/infiltrate. No pleural effusion or pneumothorax. Surgical clips right chest. IMPRESSION: 1. Cardiac pacer in stable position. Prior cardiac valve replacement. Stable cardiomegaly. 2.  Mild right base atelectasis/infiltrate. Electronically Signed   By: Marcello Moores  Register   On: 07/22/2018 12:19    Pending Labs Unresulted Labs (From admission, onward)    Start     Ordered   07/22/18 1943  SARS Coronavirus 2 (CEPHEID - Performed in [place] hospital lab), [facility] Order  Once,   STAT    Question:  Rule Out  Answer:  Yes   07/22/18 1942   07/22/18 1923  HIV antibody (Routine Testing)  Once,   STAT     07/22/18 1925           Vitals/Pain Today's Vitals   07/22/18 1136 07/22/18 1627 07/22/18 1730 07/22/18 1745  BP: (!) 144/61 124/83 (!) 161/108 133/84  Pulse: 80 68 62   Resp: 16 12 12 16   Temp: 98.2  F (36.8 C)     TempSrc: Oral     SpO2: 98% 100% 100%   Weight:      Height:      PainSc:        Isolation Precautions No active isolations  Medications Medications  acetaminophen (TYLENOL) tablet 650 mg (has no administration in time range)  ondansetron (ZOFRAN) injection 4 mg (has no administration in time range)  enoxaparin (LOVENOX) injection 40 mg (has no administration in time range)  nitroGLYCERIN (NITROSTAT) SL tablet 0.4 mg (0.4 mg Sublingual Given 07/22/18 2034)  aspirin EC tablet 81 mg (has no administration in time range)  acetaminophen (TYLENOL) tablet 1,000 mg (has no administration in time range)  atorvastatin (LIPITOR) tablet 80 mg (has no administration in time range)  carvedilol (COREG) tablet 6.25 mg (has no administration in time range)  clopidogrel (PLAVIX) tablet 75 mg (has no administration in time range)  Bempedoic Acid TABS 180 mg (has no administration in time range)  ezetimibe (ZETIA) tablet 10 mg (has no administration in time range)  furosemide (LASIX) tablet 40 mg (has no administration in time range)  losartan (COZAAR) tablet 12.5 mg (has no administration in time range)  aspirin tablet 325 mg (325 mg Oral Given 07/22/18 2033)    Mobility walks Low fall risk   Focused Assessments Cardiac Assessment Handoff:    Lab Results  Component Value Date   TROPONINI <0.03 09/26/2016   Lab Results  Component Value Date   DDIMER 0.71 (H) 03/10/2014   Does the Patient currently have chest pain? No     R Recommendations: See Admitting Provider Note  Report given to:   Additional Notes:

## 2018-07-22 NOTE — ED Notes (Signed)
In to room, pt hyperventilating and c/o L sided CP at defibrillator site.  Pt anxious and crying, able to console only briefly.  States she feels like she can't breathe, repeatedly with sats at 100% RA.  Oxygen placed for pt comfort only.

## 2018-07-22 NOTE — ED Notes (Signed)
Pt restful at this time without anxiety noted.  Pleasant conversation with nurse.  Pure wick placed for urination.  Pt denies any other needs at this time.

## 2018-07-23 DIAGNOSIS — E785 Hyperlipidemia, unspecified: Secondary | ICD-10-CM | POA: Diagnosis not present

## 2018-07-23 DIAGNOSIS — I5042 Chronic combined systolic (congestive) and diastolic (congestive) heart failure: Secondary | ICD-10-CM | POA: Diagnosis not present

## 2018-07-23 DIAGNOSIS — R079 Chest pain, unspecified: Secondary | ICD-10-CM

## 2018-07-23 DIAGNOSIS — I2510 Atherosclerotic heart disease of native coronary artery without angina pectoris: Secondary | ICD-10-CM | POA: Diagnosis not present

## 2018-07-23 LAB — LIPID PANEL
Cholesterol: 120 mg/dL (ref 0–200)
HDL: 33 mg/dL — ABNORMAL LOW (ref >40)
LDL Cholesterol: 66 mg/dL (ref 0–99)
Total CHOL/HDL Ratio: 3.6 RATIO
Triglycerides: 106 mg/dL (ref <150)
VLDL: 21 mg/dL (ref 0–40)

## 2018-07-23 LAB — HIV ANTIBODY (ROUTINE TESTING W REFLEX): HIV Screen 4th Generation wRfx: NONREACTIVE

## 2018-07-23 LAB — COMPREHENSIVE METABOLIC PANEL
ALT: 15 U/L (ref 0–44)
AST: 22 U/L (ref 15–41)
Albumin: 3.8 g/dL (ref 3.5–5.0)
Alkaline Phosphatase: 71 U/L (ref 38–126)
Anion gap: 9 (ref 5–15)
BUN: 7 mg/dL (ref 6–20)
CO2: 26 mmol/L (ref 22–32)
Calcium: 9.4 mg/dL (ref 8.9–10.3)
Chloride: 108 mmol/L (ref 98–111)
Creatinine, Ser: 0.87 mg/dL (ref 0.44–1.00)
GFR calc Af Amer: >60 mL/min (ref >60)
GFR calc non Af Amer: >60 mL/min (ref >60)
Glucose, Bld: 85 mg/dL (ref 70–99)
Potassium: 4 mmol/L (ref 3.5–5.1)
Sodium: 143 mmol/L (ref 135–145)
Total Bilirubin: 1.1 mg/dL (ref 0.3–1.2)
Total Protein: 7.2 g/dL (ref 6.5–8.1)

## 2018-07-23 NOTE — Progress Notes (Signed)
Pt discharge instructions reviewed with pt. Pt verbalizes understanding and states she has no questions. Pt belongings with pt. Pt is not in distress. Pt discharged via wheelchair. Pt's daughter is driving her home. 

## 2018-07-23 NOTE — Discharge Summary (Signed)
Physician Discharge Summary  SHARMILA WROBLESKI GNF:[id number] DOB: February 03, 1965 DOA: 07/22/2018  PCP: Gara Kroner, DO  Admit date: 07/22/2018 Discharge date: 07/23/2018  Admitted From: home Discharge disposition: home   Recommendations for Outpatient Follow-Up:   Resume home meds OTC voltaren gel  Discharge Diagnosis:   Principal Problem:   Chest pain, rule out acute myocardial infarction Active Problems:   HTN (hypertension)   CAD - CFX DES 01/17/13   Chronic combined systolic (congestive) and diastolic (congestive) heart failure ([place])    Discharge Condition: Improved.  Diet recommendation: Low sodium, heart healthy  Wound care: None.  Code status: Full.   History of Present Illness:    KARLEA MCKIBBIN is a 53 y.o. female with medical history significant of HTN, NSTEMI s/p DES to CFX, NICM, BIV/ICD placement, MR s/p repair, PAD  Patient presents to the ED with c/o CP.  Onset last night while asleep and woke her from sleeping.  Pressure like sensation.  Was severe earlier in day though improved at this point.  Associated SOB earlier in day though none now.  Patient presented to ED.   Hospital Course by Problem:  Chest pain -troponins negative Seen by cardiology:I do not think CP is cardiac in origin   Probable musculoskeletal   Treat for pain   -PRN voltaren gel  HTN - continue home BP meds   Medical Consultants:   cards   Discharge Exam:   Vitals:   07/23/18 0817 07/23/18 1204  BP: 118/85 108/74  Pulse: 62 65  Resp: 20 20  Temp: 97.6 F (36.4 C) (!) 97.5 F (36.4 C)  SpO2: 98% 99%   Vitals:   07/23/18 0014 07/23/18 0338 07/23/18 0817 07/23/18 1204  BP: 117/80 125/86 118/85 108/74  Pulse: 60 68 62 65  Resp: 18 18 20 20   Temp: 98 F (36.7 C) 98.4 F (36.9 C) 97.6 F (36.4 C) (!) 97.5 F (36.4 C)  TempSrc:   Oral Oral  SpO2: 100% 98% 98% 99%  Weight:  57.4 kg    Height:        General exam: Appears calm and comfortable.    The  results of significant diagnostics from this hospitalization (including imaging, microbiology, ancillary and laboratory) are listed below for reference.     Procedures and Diagnostic Studies:   Dg Chest 2 View  Result Date: 07/22/2018 CLINICAL DATA:  Cough.  Soreness. EXAM: CHEST - 2 VIEW COMPARISON:  01/25/2017. FINDINGS: Cardiac pacer in stable position. Prior cardiac valve replacement. Heart size stable. Mild right base atelectasis/infiltrate. No pleural effusion or pneumothorax. Surgical clips right chest. IMPRESSION: 1. Cardiac pacer in stable position. Prior cardiac valve replacement. Stable cardiomegaly. 2.  Mild right base atelectasis/infiltrate. Electronically Signed   By: Marcello Moores  Register   On: 07/22/2018 12:19     Labs:   Basic Metabolic Panel: Recent Labs  Lab 07/22/18 1204 07/23/18 1017  NA 140 143  K 3.8 4.0  CL 108 108  CO2 23 26  GLUCOSE 75 85  BUN 8 7  CREATININE 0.87 0.87  CALCIUM 8.9 9.4   GFR Estimated Creatinine Clearance: 57.8 mL/min (by C-G formula based on SCr of 0.87 mg/dL). Liver Function Tests: Recent Labs  Lab 07/23/18 1017  AST 22  ALT 15  ALKPHOS 71  BILITOT 1.1  PROT 7.2  ALBUMIN 3.8   No results for input(s): LIPASE, AMYLASE in the last 168 hours. No results for input(s): AMMONIA in the last 168 hours.  Coagulation profile No results for input(s): INR, PROTIME in the last 168 hours.  CBC: Recent Labs  Lab 07/22/18 1204  WBC 8.2  HGB 14.3  HCT 44.3  MCV 90.6  PLT 252   Cardiac Enzymes: No results for input(s): CKTOTAL, CKMB, CKMBINDEX, TROPONINI in the last 168 hours. BNP: Invalid input(s): POCBNP CBG: No results for input(s): GLUCAP in the last 168 hours. D-Dimer No results for input(s): DDIMER in the last 72 hours. Hgb A1c No results for input(s): HGBA1C in the last 72 hours. Lipid Profile Recent Labs    07/23/18 1017  CHOL 120  HDL 33*  LDLCALC 66  TRIG 161106  CHOLHDL 3.6   Thyroid function studies No results  for input(s): TSH, T4TOTAL, T3FREE, THYROIDAB in the last 72 hours.  Invalid input(s): FREET3 Anemia work up No results for input(s): VITAMINB12, FOLATE, FERRITIN, TIBC, IRON, RETICCTPCT in the last 72 hours. Microbiology Recent Results (from the past 240 hour(s))  SARS Coronavirus 2 (CEPHEID - Performed in [facility]), Hosp Order     Status: None   Collection Time: 07/22/18  8:45 PM   Specimen: Nasopharyngeal Swab  Result Value Ref Range Status   SARS Coronavirus 2 NEGATIVE NEGATIVE Final    Comment: (NOTE) If result is NEGATIVE SARS-CoV-2 target nucleic acids are NOT DETECTED. The SARS-CoV-2 RNA is generally detectable in upper and lower  respiratory specimens during the acute phase of infection. The lowest  concentration of SARS-CoV-2 viral copies this assay can detect is 250  copies / mL. A negative result does not preclude SARS-CoV-2 infection  and should not be used as the sole basis for treatment or other  patient management decisions.  A negative result may occur with  improper specimen collection / handling, submission of specimen other  than nasopharyngeal swab, presence of viral mutation(s) within the  areas targeted by this assay, and inadequate number of viral copies  (<250 copies / mL). A negative result must be combined with clinical  observations, patient history, and epidemiological information. If result is POSITIVE SARS-CoV-2 target nucleic acids are DETECTED. The SARS-CoV-2 RNA is generally detectable in upper and lower  respiratory specimens dur ing the acute phase of infection.  Positive  results are indicative of active infection with SARS-CoV-2.  Clinical  correlation with patient history and other diagnostic information is  necessary to determine patient infection status.  Positive results do  not rule out bacterial infection or co-infection with other viruses. If result is PRESUMPTIVE POSTIVE SARS-CoV-2 nucleic acids MAY BE PRESENT.   A  presumptive positive result was obtained on the submitted specimen  and confirmed on repeat testing.  While 2019 novel coronavirus  (SARS-CoV-2) nucleic acids may be present in the submitted sample  additional confirmatory testing may be necessary for epidemiological  and / or clinical management purposes  to differentiate between  SARS-CoV-2 and other Sarbecovirus currently known to infect humans.  If clinically indicated additional testing with an alternate test  methodology [phone]([id number]) is advised. The SARS-CoV-2 RNA is generally  detectable in upper and lower respiratory sp ecimens during the acute  phase of infection. The expected result is Negative. Fact Sheet for Patients:  BoilerBrush.com.cyhttps://www.fda.gov/media/136312/download Fact Sheet for Healthcare Providers: https://pope.com/https://www.fda.gov/media/136313/download This test is not yet approved or cleared by the [place] FDA and has been authorized for detection and/or diagnosis of SARS-CoV-2 by FDA under an Emergency Use Authorization (EUA).  This EUA will remain in effect (meaning this test can be used) for the duration  of the COVID-19 declaration under Section 564(b)(1) of the Act, 21 U.S.C. section 360bbb-3(b)(1), unless the authorization is terminated or revoked sooner. Performed at [facility], [address] [place], [place] [address]      Discharge Instructions:   Discharge Instructions    Diet - low sodium heart healthy   Complete by: As directed    Discharge instructions   Complete by: As directed    Can use OTC voltaren gel on your chest wall for discomfort   Increase activity slowly   Complete by: As directed      Allergies as of 07/23/2018      Reactions   Aldomet [methyldopa] Other (See Comments)   Severe hypotension   Brilinta [ticagrelor] Shortness Of Breath   Other Other (See Comments)   Nuclear Stress Test Medication caused seizures   Coumadin [warfarin Sodium] Swelling   Arm swelled   Eliquis [apixaban]  Other (See Comments)   BP got too low   Penicillins Rash   Has patient had a PCN reaction causing immediate rash, facial/tongue/throat swelling, SOB or lightheadedness with hypotension: Yes Has patient had a PCN reaction causing severe rash involving mucus membranes or skin necrosis: No Has patient had a PCN reaction that required hospitalization: No Has patient had a PCN reaction occurring within the last 10 years: No If all of the above answers are "NO", then may proceed with Cephalosporin use.      Medication List    TAKE these medications   acetaminophen 500 MG tablet Commonly known as: TYLENOL Take 1,000 mg by mouth every 6 (six) hours as needed for headache (pain).   aspirin EC 81 MG tablet Take 81 mg by mouth daily.   atorvastatin 80 MG tablet Commonly known as: LIPITOR Take 1 tablet (80 mg total) by mouth daily.   carvedilol 6.25 MG tablet Commonly known as: COREG Take 6.25 mg by mouth 2 (two) times daily with a meal.   clopidogrel 75 MG tablet Commonly known as: PLAVIX TAKE 1 TABLET BY MOUTH DAILY WITH BREAKFAST What changed: when to take this   ezetimibe 10 MG tablet Commonly known as: ZETIA Take 1 tablet (10 mg total) by mouth daily.   furosemide 40 MG tablet Commonly known as: LASIX Take 40 mg by mouth daily as needed for fluid or edema (shortness of breath).   losartan 25 MG tablet Commonly known as: COZAAR Take 0.5 tablets (12.5 mg total) by mouth daily.   Nexletol 180 MG Tabs Generic drug: Bempedoic Acid Take 180 mg by mouth daily.      Follow-up Information    Montez Hageman, DO Follow up in 1 week(s).   Specialty: Family Medicine Contact information: [address] [place] [address] [phone]        Corky Crafts, MD .   Specialties: Cardiology, Radiology, Interventional Cardiology Contact information: [address] [place] [address] [phone]            Time coordinating  discharge: 25 min  Signed:  Joseph Art DO  [facility] 07/23/2018, 12:43 PM

## 2018-07-23 NOTE — Consult Note (Addendum)
Cardiology Consultation:   Patient ID: Lindsay Camacho; [id number]; [birth year]   Admit date: 07/22/2018 Date of Consult: 07/23/2018  Primary Care Provider: Gara Kroner, DO Primary Cardiologist: Larae Grooms, MD Primary Electrophysiologist:  None   Patient Profile:   Lindsay Camacho is a 53 y.o. female with a PMH of CAD s/p PCI/DES to LCx in 2015, chronic combined CHF, s/p BiV/ICD, MR s/p repair in 2018, PAD, and tobacco abuse, who is being seen today for the evaluation of chest pain at the request of Dr. Alcario Drought.  History of Present Illness:   Lindsay Camacho was in her usual state of health until the early morning hours of 07/22/2018 when she was awoken from sleep with chest pain. She reported associated SOB and tingling in her left arm. She stated symptoms felt similar to prior MI. Initial episode lasted ~20 minutes and resolved spontaneously. She went back to sleep and was awoken some time later with similar symptoms. She called our office to report events and was recommended to present to the ED for further evaluation.   She was last evaluated by cardiology via a telemedicine visit with Dr. Irish Lack 04/2018, at which time she was without cardiac complaints, walking 1 mile per day without anginal complaints, and was experiencing atypical foot pain after given praluent injections. Pharmacy transitioned from praluent to nexletol for cholesterol control. Her last device interrogation 04/2018 showed normal function and appropriate histograms. Her last echo 01/2017 showed EF 25%, diffuse hypokinesis, moderately dilated LV, mild LVH, midl AI, stable mitral valve s/p repair, and moderate TR. Her last ischemic evaluation was a R/LHC prior to minimally invasive MVR in 2018 showed patent LCx stent, 30% distal RCA and p-mLAD stenosis, and evidence of spasm in RCA which resolved with SL nitro.   At the time of this evaluation she continues to feel some soreness in her chest and arm. She is rubbing the left  side of her chest upon my entering the room. Prior to yesterday, no recent complaints of chest pain or SOB. She walks ~1 mile per day without anginal complaints. She has chronic orthopnea which is unchanged in recent months/years. No complaints of LE edema, PND, weight gain, dizziness, lightheadedness, or syncope. She denies any recent changes in activity that might have cause a muscle stain.   Hospital course: BP intermittently elevated, otherwise VSS. Labs notable for electrolytes wnl, Cr 0.87, CBC wnl, Trop 7>6>8>8. COVID-19 negative. CXR with stable cardiomegaly and mild right base atelectasis/infiltrate. EKG with sinus rhythm, rate 71, V paced rhythm, no change from previous. Patient was admitted to medicine. Cardiology asked to evaluate.    Past Medical History:  Diagnosis Date   Acute myocardial infarction of other lateral wall, initial episode of care 01/2013   DES CFX   Aortoiliac occlusive disease ([place]) 08/22/2016   CAD (coronary artery disease) 11/13/2011   50% ? proximal LCx stenosis per Cath at [facility]   Chest pain, atypical, muscular skelatal   01/09/2014   Dyspnea    Heart failure (HCC)    HLD (hyperlipidemia)    Hypertension    Intermediate coronary syndrome (HCC)    LBBB (left bundle branch block)    Mitral regurgitation    Nonischemic cardiomyopathy ([place]) 01/26/2017   PVD (peripheral vascular disease) ([place])    Right CFA stenosis per report on 11/14/2011   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/25/2016   Edwards McArthy-Adams IMR ETLogix ring annuloplasty (Model 4100, Serial [id number], size 26) placed via right mini thoracotomy  approach   Stenosis of left subclavian artery (HCC) 08/22/2016   Stroke (HCC)    tia with no deficits    Past Surgical History:  Procedure Laterality Date   APPENDECTOMY     BIV ICD INSERTION CRT-D N/A 01/24/2017   Procedure: BIV ICD INSERTION CRT-D;  Surgeon: Marinus Mawaylor, Gregg W, MD;  [facility];  Service:  Cardiovascular;  Laterality: N/A;   CARDIAC CATHETERIZATION  04/14/2013   Non-obstructive disease, patent CFX stent   CARDIAC CATHETERIZATION N/A 09/21/2014   Procedure: Left Heart Cath and Coronary Angiography;  Surgeon: Lennette Biharihomas A Kelly, MD;  [facility]: [facility];  Service: Cardiovascular;  Laterality: N/A;   CORONARY ANGIOPLASTY WITH STENT PLACEMENT  01/17/2013   mild disease except 99% CFX, rx with  2.5 x 28 Alpine drug-eluting stent    GROIN DEBRIDEMENT Right 04/14/2013   Procedure: Emergency Evacuation of Retroperitoneal Hematoma and Repair Right External Iliac Artery Pseudoaneurysm    ;  Surgeon: Sherren Kernsharles E Fields, MD;  [facility];  Service: Vascular;  Laterality: Right;   LEFT HEART CATHETERIZATION WITH CORONARY ANGIOGRAM N/A 01/18/2013   Procedure: LEFT HEART CATHETERIZATION WITH CORONARY ANGIOGRAM;  Surgeon: Corky CraftsJayadeep S Varanasi, MD;  [facility];  Service: Cardiovascular;  Laterality: N/A;   LEFT HEART CATHETERIZATION WITH CORONARY ANGIOGRAM N/A 04/14/2013   Procedure: LEFT HEART CATHETERIZATION WITH CORONARY ANGIOGRAM;  Surgeon: Corky CraftsJayadeep S Varanasi, MD;  [facility];  Service: Cardiovascular;  Laterality: N/A;   MITRAL VALVE REPAIR Right 08/25/2016   Procedure: MINIMALLY INVASIVE MITRAL VALVE REPAIR;  Surgeon: Purcell Nailswen, Clarence H, MD;  [facility];  Service: Open Heart Surgery;  Laterality: Right;   MULTIPLE EXTRACTIONS WITH ALVEOLOPLASTY N/A 08/23/2016   Procedure: Extraction of tooth #'s 17, 20-23, 26-29 with alveoloplasty;  Surgeon: Charlynne PanderKulinski, Ronald F, DDS;  [facility];  Service: Oral Surgery;  Laterality: N/A;   PERCUTANEOUS CORONARY STENT INTERVENTION (PCI-S)  01/18/2013   Procedure: PERCUTANEOUS CORONARY STENT INTERVENTION (PCI-S);  Surgeon: Corky CraftsJayadeep S Varanasi, MD;  [facility];  Service: Cardiovascular;;   RIGHT/LEFT HEART CATH AND CORONARY ANGIOGRAPHY N/A 08/21/2016   Procedure: RIGHT/LEFT HEART CATH AND CORONARY ANGIOGRAPHY;   Surgeon: Kathleene HazelMcAlhany, Christopher D, MD;  [facility];  Service: Cardiovascular;  Laterality: N/A;   TEE WITHOUT CARDIOVERSION N/A 03/06/2014   Procedure: TRANSESOPHAGEAL ECHOCARDIOGRAM (TEE);  Surgeon: Lars MassonKatarina H Nelson, MD;  [facility];  Service: Cardiovascular;  Laterality: N/A;   TEE WITHOUT CARDIOVERSION N/A 08/18/2016   Procedure: TRANSESOPHAGEAL ECHOCARDIOGRAM (TEE);  Surgeon: Lewayne Buntingrenshaw, Brian S, MD;  [facility];  Service: Cardiovascular;  Laterality: N/A;   TEE WITHOUT CARDIOVERSION N/A 08/25/2016   Procedure: TRANSESOPHAGEAL ECHOCARDIOGRAM (TEE);  Surgeon: Purcell Nailswen, Clarence H, MD;  [facility];  Service: Open Heart Surgery;  Laterality: N/A;   TUBAL LIGATION       Home Medications:  Prior to Admission medications   Medication Sig Start Date End Date Taking? Authorizing Provider  acetaminophen (TYLENOL) 500 MG tablet Take 1,000 mg by mouth every 6 (six) hours as needed for headache (pain).   Yes [provider]  aspirin EC 81 MG tablet Take 81 mg by mouth daily.   Yes [provider]  atorvastatin (LIPITOR) 80 MG tablet Take 1 tablet (80 mg total) by mouth daily. 05/16/18  Yes Simmons, Brittainy M, PA-C  Bempedoic Acid (NEXLETOL) 180 MG TABS Take 180 mg by mouth daily.   Yes [provider]  carvedilol (COREG) 6.25  MG tablet Take 6.25 mg by mouth 2 (two) times daily with a meal.   Yes [provider]  clopidogrel (PLAVIX) 75 MG tablet TAKE 1 TABLET BY MOUTH DAILY WITH BREAKFAST Patient taking differently: Take 75 mg by mouth daily.  05/28/18  Yes Corky Crafts, MD  ezetimibe (ZETIA) 10 MG tablet Take 1 tablet (10 mg total) by mouth daily. 03/29/17  Yes Robbie Lis M, PA-C  furosemide (LASIX) 40 MG tablet Take 40 mg by mouth daily as needed for fluid or edema (shortness of breath).    Yes [provider]  losartan (COZAAR) 25 MG tablet Take 0.5 tablets (12.5 mg total) by mouth daily. 04/30/18   Yes Corky Crafts, MD    Inpatient Medications: Scheduled Meds:  aspirin EC  81 mg Oral Daily   atorvastatin  80 mg Oral Daily   Bempedoic Acid  180 mg Oral Daily   carvedilol  6.25 mg Oral BID WC   clopidogrel  75 mg Oral Daily   enoxaparin (LOVENOX) injection  40 mg Subcutaneous Q24H   ezetimibe  10 mg Oral Daily   losartan  12.5 mg Oral Daily   Continuous Infusions:  PRN Meds: acetaminophen, acetaminophen, furosemide, nitroGLYCERIN, ondansetron (ZOFRAN) IV  Allergies:    Allergies  Allergen Reactions   Aldomet [Methyldopa] Other (See Comments)    Severe hypotension   Brilinta [Ticagrelor] Shortness Of Breath   Other Other (See Comments)    Nuclear Stress Test Medication caused seizures   Coumadin [Warfarin Sodium] Swelling    Arm swelled   Eliquis [Apixaban] Other (See Comments)    BP got too low   Penicillins Rash    Has patient had a PCN reaction causing immediate rash, facial/tongue/throat swelling, SOB or lightheadedness with hypotension: Yes Has patient had a PCN reaction causing severe rash involving mucus membranes or skin necrosis: No Has patient had a PCN reaction that required hospitalization: No Has patient had a PCN reaction occurring within the last 10 years: No If all of the above answers are "NO", then may proceed with Cephalosporin use.     Social History:   Social History   Socioeconomic History   Marital status: Single    Spouse name: Not on file   Number of children: Not on file   Years of education: Not on file   Highest education level: Not on file  Occupational History   Not on file  Social Needs   Financial resource strain: Not on file   Food insecurity    Worry: Not on file    Inability: Not on file   Transportation needs    Medical: Not on file    Non-medical: Not on file  Tobacco Use   Smoking status: Former Smoker    Packs/day: 0.50    Types: Cigarettes    Quit date: 08/19/2015    Years since  quitting: 2.9   Smokeless tobacco: Never Used   Tobacco comment: i QUIT SMOKING A YEAR AGO, i DONT REMEMBER WHAT MONTH  Substance and Sexual Activity   Alcohol use: No    Alcohol/week: 0.0 standard drinks   Drug use: Yes    Types: Marijuana   Sexual activity: Yes    Birth control/protection: None  Lifestyle   Physical activity    Days per week: Not on file    Minutes per session: Not on file   Stress: Not on file  Relationships   Social connections    Talks on phone: Not on  file    Gets together: Not on file    Attends religious service: Not on file    Active member of club or organization: Not on file    Attends meetings of clubs or organizations: Not on file    Relationship status: Not on file   Intimate partner violence    Fear of current or ex partner: Not on file    Emotionally abused: Not on file    Physically abused: Not on file    Forced sexual activity: Not on file  Other Topics Concern   Not on file  Social History Narrative   Not on file    Family History:    Family History  Problem Relation Age of Onset   CAD Mother [id number]   Lung cancer Mother    Bladder Cancer Mother    Stroke Mother    Heart disease Mother        Before age 53   Hypertension Mother    Heart attack Mother    CAD Father [id number]   Heart disease Father        Before age 53   Heart attack Father    Stroke Father        Bleeding stroke     ROS:  Please see the history of present illness.   All other ROS reviewed and negative.     Physical Exam/Data:   Vitals:   07/22/18 2108 07/23/18 0014 07/23/18 0338 07/23/18 0817  BP: (!) 154/73 117/80 125/86 118/85  Pulse: 61 60 68 62  Resp: 18 18 18 20   Temp: 98 F (36.7 C) 98 F (36.7 C) 98.4 F (36.9 C) 97.6 F (36.4 C)  TempSrc:    Oral  SpO2: 100% 100% 98% 98%  Weight: 57.2 kg  57.4 kg   Height: 5' 1.5" (1.562 m)       Intake/Output Summary (Last 24 hours) at 07/23/2018 0852 Last data filed at 07/22/2018  2100 Gross per 24 hour  Intake 222 ml  Output --  Net 222 ml   Filed Weights   07/22/18 1133 07/22/18 2108 07/23/18 0338  Weight: 54.4 kg 57.2 kg 57.4 kg   Body mass index is 23.52 kg/m.  General: Appears older than stated age, laying in bed in no acute distress HEENT: sclera anicteric  Neck: no JVD Vascular: No carotid bruits; distal pulses 2+ bilaterally Cardiac:  normal S1, S2; RRR; no murmurs, rubs, or gallops Lungs:  clear to auscultation bilaterally, no wheezing, rhonchi or rales  Abd: NABS, soft, nontender, no hepatomegaly Ext: no edema Musculoskeletal:  No deformities, BUE and BLE strength normal and equal; significant left chest wall and posterior upper arm TTP Skin: warm and dry  Neuro:  CNs 2-12 intact, no focal abnormalities noted Psych:  Normal affect   EKG:  The EKG was personally reviewed and demonstrates:  sinus rhythm, rate 71, V paced rhythm, no change from previous. Telemetry:  Telemetry was personally reviewed and demonstrates:  V paced rhythm  Relevant CV Studies: Right/Left heart catheterization 08/2016:  Ost RCA to Prox RCA lesion, 10 %stenosed.  Dist RCA lesion, 30 %stenosed.  Prox Cx to Mid Cx lesion, 10 %stenosed.  Prox LAD to Mid LAD lesion, 30 %stenosed.   1. Patent stent in the Circumflex artery with minimal restenosis 2. Mild non-obstructive disease in the RCA and LAD (of note, there was spasm in the RCA with catheter engagement that resolved with SL NTG)  Recommendations: Medical management of CAD. Continue planning  for valve repair/replacement.   Echocardiogram 01/2017: Study Conclusions  - Left ventricle: Abnormal septal motion. The cavity size was moderately dilated. Wall thickness was increased in a pattern of mild LVH. The estimated ejection fraction was 25%. Diffuse hypokinesis. - Aortic valve: There was mild regurgitation. - Mitral valve: Post repair with ring no signficiant MR and stable low mean gradient Valve area by continuity  equation (using LVOT flow): 0.82 cm^2. - Atrial septum: No defect or patent foramen ovale was identified. - Tricuspid valve: There was moderate regurgitation.  Laboratory Data:  Chemistry Recent Labs  Lab 07/22/18 1204  NA 140  K 3.8  CL 108  CO2 23  GLUCOSE 75  BUN 8  CREATININE 0.87  CALCIUM 8.9  GFRNONAA >60  GFRAA >60  ANIONGAP 9    No results for input(s): PROT, ALBUMIN, AST, ALT, ALKPHOS, BILITOT in the last 168 hours. Hematology Recent Labs  Lab 07/22/18 1204  WBC 8.2  RBC 4.89  HGB 14.3  HCT 44.3  MCV 90.6  MCH 29.2  MCHC 32.3  RDW 14.2  PLT 252   Cardiac EnzymesNo results for input(s): TROPONINI in the last 168 hours. No results for input(s): TROPIPOC in the last 168 hours.  BNPNo results for input(s): BNP, PROBNP in the last 168 hours.  DDimer No results for input(s): DDIMER in the last 168 hours.  Radiology/Studies:  Dg Chest 2 View  Result Date: 07/22/2018 CLINICAL DATA:  Cough.  Soreness. EXAM: CHEST - 2 VIEW COMPARISON:  01/25/2017. FINDINGS: Cardiac pacer in stable position. Prior cardiac valve replacement. Heart size stable. Mild right base atelectasis/infiltrate. No pleural effusion or pneumothorax. Surgical clips right chest. IMPRESSION: 1. Cardiac pacer in stable position. Prior cardiac valve replacement. Stable cardiomegaly. 2.  Mild right base atelectasis/infiltrate. Electronically Signed   By: Maisie Fus  Register   On: 07/22/2018 12:19    Assessment and Plan:   1. Atypical chest pain in patient with CAD s/p PCI/DES to LCx in 2015: patient presented with chest pain that awoke her from sleep early 07/22/2018. EKG with paced rhythm. Trop trend 7>6>8>8. Her last ischemic evaluation was a R/LHC in 2018 prior to minimally invasive MVR which showed mild non-obstructive CAD in LAD and RCA, vasospasm in RCA responsive to SL nitro, and patent LCx stent. Given TTP of chest wall, negative troponins, and no exertional component to symptoms, favor non-cardiac  etiology of chest pain.  - Anticipate close observation outpatient to monitor for recurrent symptoms.  - Could consider increasing carvedilol vs adding low dose amlodipine for anti-anginal effects - Continue aspirin, plavix, statin, and zetia  2. Chronic combined CHF: Last echo in 01/2017 with EF 25%. S/p BiV/ICD. CXR without overt edema this admission. She appears euvolemic on exam. - Continue carvedilol, losartan, and lasix  3. S/p minimally invasive MVR in 2018: follows with Dr. Barry Dienes. Currently has no teeth but would need SBE ppx for any dental procedures. Valve stable on last echo 01/2017.  - Continue routine outpatient monitoring   4. HLD: Recently transitioned from praluent to nexletol.  - Check FLP and LFTs this morning - Continue atorvastatin and zetia.   5. S/p BiV/ICD: last device interrogation with normal function - Continue routine outpatient monitoring.   6. PAD:  - Continue aspirin, plavix, and statin   For questions or updates, please contact [facility] Please consult [email or web address] for contact info under Cardiology/STEMI.   Signed, Beatriz Stallion, PA-C  07/23/2018 8:52 AM [phone]  Patient seen and examined   I  agree with findings as noted by K Kroeger above    Pt complaining of soreness in chest and arm.    Breathing unchanged.  On exam,   Pt comfortable laying flat Neck:  JVP is normal Lungs are CTA  Cardac  RRR  No S3  No murmurs Chest   Tender to palpation   This is pain she is experiencing Abd is benign     Ext are without edema  I agree with K Kroeger above   I do not think CP is cardiac in origin   Probable musculoskeletal   Treat for pain   Rest She has signif CAD, CHF, PAF   Keep on same medical Rx  Volume status looks good . OK to d/c from cardiac standpoint  Dietrich PatesPaula Denise Bramblett MD

## 2018-07-27 NOTE — ED Provider Notes (Signed)
[facility] Provider Note   CSN: [id number] Arrival date & time: 07/22/18  1124     History   Chief Complaint Chief Complaint  Patient presents with  . Chest Pain    HPI Lindsay Camacho is a 53 y.o. female.  HPI: A 53 year old patient with a history of CVA, hypertension and hypercholesterolemia presents for evaluation of chest pain. Initial onset of pain was more than 6 hours ago. The patient's chest pain is sharp and is not worse with exertion. The patient's chest pain is middle- or left-sided, is not well-localized, is not described as heaviness/pressure/tightness and does radiate to the arms/jaw/neck. The patient does not complain of nausea and denies diaphoresis. The patient has no history of peripheral artery disease, has not smoked in the past 90 days, denies any history of treated diabetes, has no relevant family history of coronary artery disease (first degree relative at less than age 54) and does not have an elevated BMI (>=30).   HPI  Past Medical History:  Diagnosis Date  . Acute myocardial infarction of other lateral wall, initial episode of care 01/2013   DES CFX  . Aortoiliac occlusive disease ([place]) 08/22/2016  . CAD (coronary artery disease) 11/13/2011   50% ? proximal LCx stenosis per Cath at [facility]  . Chest pain, atypical, muscular skelatal   01/09/2014  . Dyspnea   . Heart failure ([place])   . HLD (hyperlipidemia)   . Hypertension   . Intermediate coronary syndrome ([place])   . LBBB (left bundle branch block)   . Mitral regurgitation   . Nonischemic cardiomyopathy ([place]) 01/26/2017  . PVD (peripheral vascular disease) ([place])    Right CFA stenosis per report on 11/14/2011  . 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/25/2016   Edwards McArthy-Adams IMR ETLogix ring annuloplasty (Model 4100, Serial [id number], size 26) placed via right mini thoracotomy approach  . Stenosis of left subclavian artery ([place]) 08/22/2016  . Stroke [facility])    tia with no  deficits    Patient Active Problem List   Diagnosis Date Noted  . Chest pain, rule out acute myocardial infarction 07/22/2018  . 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 Nonischemic cardiomyopathy ([place]) 01/26/2017  . 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 Stenosis of left subclavian artery ([name]) 08/22/2016  . Aortoiliac occlusive disease ([name]) 08/22/2016  . Acute on chronic systolic CHF (congestive heart failure) ([name])   . Mitral regurgitation 08/17/2016  . Heart failure ([place]) 08/17/2016  . Shortness of breath   . 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  . Diarrhea 10/05/2014  . Syncope 03/09/2014  . Chronic combined systolic (congestive) and diastolic (congestive) heart failure ([name]) 01/12/2014  . Chest pain, atypical, muscular skelatal   01/09/2014  . Diastolic CHF, acute ([name]) 01/08/2014  . Chest pain at rest 01/08/2014  . Congestive heart disease ([name])   . Post-op pain-Right groin area 05/27/2013  . Drainage from wound-Groin area 05/27/2013  . Retroperitoneal hematoma 05/01/2013  . Retroperitoneal bleed 04/15/2013  . Anemia due to blood loss 04/15/2013  . Hyperlipidemia with target LDL less than 70 04/15/2013  . Intermediate coronary syndrome ([place])   . Chest pain 04/11/2013  . CAD - CFX DES 01/17/13 01/20/2013  . Rash post PCI 01/20/2013  . NSTEMI (non-ST elevated myocardial infarction) ([name]) 01/17/2013  . Chest pain, musculoskeletal 10/07/2012  . HTN (hypertension) 10/07/2012  . LBBB (left bundle branch block) 10/07/2012  . Tobacco abuse 10/07/2012    Past  Surgical History:  Procedure Laterality Date  . APPENDECTOMY    . BIV ICD INSERTION CRT-D N/A 01/24/2017   Procedure: BIV ICD INSERTION CRT-D;  Surgeon: Marinus Maw, MD;  Location: [facility] INVASIVE CV LAB;  Service: Cardiovascular;  Laterality: N/A;  . CARDIAC CATHETERIZATION  04/14/2013   Non-obstructive disease, patent CFX stent  . CARDIAC CATHETERIZATION N/A  09/21/2014   Procedure: Left Heart Cath and Coronary Angiography;  Surgeon: Lennette Bihari, MD;  Location: [facility] INVASIVE CV LAB;  Service: Cardiovascular;  Laterality: N/A;  . CORONARY ANGIOPLASTY WITH STENT PLACEMENT  01/17/2013   mild disease except 99% CFX, rx with  2.5 x 28 Alpine drug-eluting stent   . GROIN DEBRIDEMENT Right 04/14/2013   Procedure: Emergency Evacuation of Retroperitoneal Hematoma and Repair Right External Iliac Artery Pseudoaneurysm    ;  Surgeon: Sherren Kerns, MD;  Location: [facility] OR;  Service: Vascular;  Laterality: Right;  . LEFT HEART CATHETERIZATION WITH CORONARY ANGIOGRAM N/A 01/18/2013   Procedure: LEFT HEART CATHETERIZATION WITH CORONARY ANGIOGRAM;  Surgeon: Corky Crafts, MD;  Location: [facility] CATH LAB;  Service: Cardiovascular;  Laterality: N/A;  . LEFT HEART CATHETERIZATION WITH CORONARY ANGIOGRAM N/A 04/14/2013   Procedure: LEFT HEART CATHETERIZATION WITH CORONARY ANGIOGRAM;  Surgeon: Corky Crafts, MD;  Location: [facility] CATH LAB;  Service: Cardiovascular;  Laterality: N/A;  . MITRAL VALVE REPAIR Right 08/25/2016   Procedure: MINIMALLY INVASIVE MITRAL VALVE REPAIR;  Surgeon: Purcell Nails, MD;  Location: [facility] OR;  Service: Open Heart Surgery;  Laterality: Right;  . MULTIPLE EXTRACTIONS WITH ALVEOLOPLASTY N/A 08/23/2016   Procedure: Extraction of tooth #'s 17, 20-23, 26-29 with alveoloplasty;  Surgeon: [name], DDS;  Location: [facility] OR;  Service: Oral Surgery;  Laterality: N/A;  . PERCUTANEOUS CORONARY STENT INTERVENTION (PCI-S)  01/18/2013   Procedure: PERCUTANEOUS CORONARY STENT INTERVENTION (PCI-S);  Surgeon: Corky Crafts, MD;  Location: [facility] CATH LAB;  Service: Cardiovascular;;  . RIGHT/LEFT HEART CATH AND CORONARY ANGIOGRAPHY N/A 08/21/2016   Procedure: RIGHT/LEFT HEART CATH AND CORONARY ANGIOGRAPHY;  Surgeon: Kathleene Hazel, MD;  Location: [facility] INVASIVE CV LAB;  Service: Cardiovascular;  Laterality: N/A;  . TEE WITHOUT CARDIOVERSION N/A  03/06/2014   Procedure: TRANSESOPHAGEAL ECHOCARDIOGRAM (TEE);  Surgeon: Lars Masson, MD;  Location: [facility] ENDOSCOPY;  Service: Cardiovascular;  Laterality: N/A;  . TEE WITHOUT CARDIOVERSION N/A 08/18/2016   Procedure: TRANSESOPHAGEAL ECHOCARDIOGRAM (TEE);  Surgeon: Lewayne Bunting, MD;  Location: [facility] ENDOSCOPY;  Service: Cardiovascular;  Laterality: N/A;  . TEE WITHOUT CARDIOVERSION N/A 08/25/2016   Procedure: TRANSESOPHAGEAL ECHOCARDIOGRAM (TEE);  Surgeon: Purcell Nails, MD;  Location: [facility] OR;  Service: Open Heart Surgery;  Laterality: N/A;  . TUBAL LIGATION       OB History   No obstetric history on file.      Home Medications    Prior to Admission medications   Medication Sig Start Date End Date Taking? Authorizing Provider  acetaminophen (TYLENOL) 500 MG tablet Take 1,000 mg by mouth every 6 (six) hours as needed for headache (pain).   Yes [provider]  aspirin EC 81 MG tablet Take 81 mg by mouth daily.   Yes [provider]  atorvastatin (LIPITOR) 80 MG tablet Take 1 tablet (80 mg total) by mouth daily. 05/16/18  Yes Simmons, Brittainy M, PA-C  Bempedoic Acid (NEXLETOL) 180 MG TABS Take 180 mg by mouth daily.   Yes [provider]  carvedilol (COREG) 6.25 MG tablet Take 6.25 mg by mouth 2 (two)  times daily with a meal.   Yes [provider]  clopidogrel (PLAVIX) 75 MG tablet TAKE 1 TABLET BY MOUTH DAILY WITH BREAKFAST Patient taking differently: Take 75 mg by mouth daily.  05/28/18  Yes Corky CraftsVaranasi, Jayadeep S, MD  ezetimibe (ZETIA) 10 MG tablet Take 1 tablet (10 mg total) by mouth daily. 03/29/17  Yes Robbie LisSimmons, Brittainy M, PA-C  furosemide (LASIX) 40 MG tablet Take 40 mg by mouth daily as needed for fluid or edema (shortness of breath).    Yes [provider]  losartan (COZAAR) 25 MG tablet Take 0.5 tablets (12.5 mg total) by mouth daily. 04/30/18  Yes Corky CraftsVaranasi, Jayadeep S, MD    Family History Family History  Problem Relation Age of  Onset  . CAD Mother [id number]  . Lung cancer Mother   . Bladder Cancer Mother   . Stroke Mother   . Heart disease Mother        Before age 53  . Hypertension Mother   . Heart attack Mother   . CAD Father [id number]  . Heart disease Father        Before age 53  . Heart attack Father   . Stroke Father        Bleeding stroke    Social History Social History   Tobacco Use  . Smoking status: Former Smoker    Packs/day: 0.50    Types: Cigarettes    Quit date: 08/19/2015    Years since quitting: 2.9  . Smokeless tobacco: Never Used  . Tobacco comment: i QUIT SMOKING A YEAR AGO, i DONT REMEMBER WHAT MONTH  Substance Use Topics  . Alcohol use: No    Alcohol/week: 0.0 standard drinks  . Drug use: Yes    Types: Marijuana     Allergies   Aldomet [methyldopa], Brilinta [ticagrelor], Other, Coumadin [warfarin sodium], Eliquis [apixaban], and Penicillins   Review of Systems Review of Systems All other systems negative except as documented in the HPI. All pertinent positives and negatives as reviewed in the HPI.  Physical Exam Updated Vital Signs BP 108/74 (BP Location: Left Arm)   Pulse 65   Temp (!) 97.5 F (36.4 C) (Oral)   Resp 20   Ht 5' 1.5" (1.562 m)   Wt 57.4 kg   LMP  (Exact Date)   SpO2 99%   BMI 23.52 kg/m   Physical Exam Vitals signs and nursing note reviewed.  Constitutional:      General: She is not in acute distress.    Appearance: She is well-developed.  HENT:     Head: Normocephalic and atraumatic.  Eyes:     Pupils: Pupils are equal, round, and reactive to light.  Neck:     Musculoskeletal: Normal range of motion and neck supple.  Cardiovascular:     Rate and Rhythm: Normal rate and regular rhythm.     Heart sounds: Normal heart sounds. No murmur. No friction rub. No gallop.   Pulmonary:     Effort: Pulmonary effort is normal. No respiratory distress.     Breath sounds: Normal breath sounds. No wheezing or rhonchi.  Abdominal:     General: Bowel sounds  are normal. There is no distension.     Palpations: Abdomen is soft.     Tenderness: There is no abdominal tenderness.  Skin:    General: Skin is warm and dry.     Capillary Refill: Capillary refill takes less than 2 seconds.     Findings: No erythema or rash.  Neurological:     Mental Status: She is alert and oriented to person, place, and time.     Motor: No abnormal muscle tone.     Coordination: Coordination normal.  Psychiatric:        Behavior: Behavior normal.      ED Treatments / Results  Labs (all labs ordered are listed, but only abnormal results are displayed) Labs Reviewed  LIPID PANEL - Abnormal; Notable for the following components:      Result Value   HDL 33 (*)    All other components within normal limits  SARS CORONAVIRUS 2 (HOSPITAL ORDER, PERFORMED IN [facility])  BASIC METABOLIC PANEL  CBC  HIV ANTIBODY (ROUTINE TESTING W REFLEX)  COMPREHENSIVE METABOLIC PANEL  TROPONIN I (HIGH SENSITIVITY)  TROPONIN I (HIGH SENSITIVITY)  TROPONIN I (HIGH SENSITIVITY)  TROPONIN I (HIGH SENSITIVITY)    EKG EKG Interpretation  Date/Time:  Monday July 22 2018 19:49:15 EDT Ventricular Rate:  59 PR Interval:  130 QRS Duration: 153 QT Interval:  483 QTC Calculation: 479 R Axis:   53 Text Interpretation:  Sinus rhythm IVCD, consider atypical LBBB Confirmed by Drema Pryardama, Pedro ([id number]) on 07/23/2018 9:03:27 AM   Radiology No results found.  Procedures Procedures (including critical care time)  Medications Ordered in ED Medications  aspirin tablet 325 mg (325 mg Oral Given 07/22/18 2033)     Initial Impression / Assessment and Plan / ED Course  I have reviewed the triage vital signs and the nursing notes.  Pertinent labs & imaging results that were available during my care of the patient were reviewed by me and considered in my medical decision making (see chart for details).     HEAR Score: 4  Patient be admitted for heart score of 4-5 and the  fact that she is having's chest pain is similar to her previous episodes with cardiac disease.  I spoke with the [facility] hospitalist will admit the patient for further evaluation and care.  She has been stable here in the emergency department.  And advised her of the plan for admission.  Final Clinical Impressions(s) / ED Diagnoses   Final diagnoses:  Precordial pain    ED Discharge Orders         Ordered    Increase activity slowly     07/23/18 1243    Diet - low sodium heart healthy     07/23/18 1243    Discharge instructions    Comments: Can use OTC voltaren gel on your chest wall for discomfort   07/23/18 1243           Charlestine NightLawyer, Kylyn Mcdade, PA-C 07/27/18 1458    Terrilee FilesButler, Michael C, MD 07/27/18 [phone]

## 2018-07-29 ENCOUNTER — Ambulatory Visit (INDEPENDENT_AMBULATORY_CARE_PROVIDER_SITE_OTHER): Payer: Medicare Other | Admitting: *Deleted

## 2018-07-29 DIAGNOSIS — I428 Other cardiomyopathies: Secondary | ICD-10-CM | POA: Diagnosis not present

## 2018-07-29 DIAGNOSIS — I5023 Acute on chronic systolic (congestive) heart failure: Secondary | ICD-10-CM

## 2018-07-30 ENCOUNTER — Telehealth: Payer: Self-pay

## 2018-07-30 NOTE — Telephone Encounter (Signed)
Left message for patient to remind of missed remote transmission.  

## 2018-07-31 LAB — CUP PACEART REMOTE DEVICE CHECK
Battery Remaining Longevity: 74 mo
Battery Voltage: 2.97 V
Brady Statistic AP VP Percent: 0.08 %
Brady Statistic AP VS Percent: 0.01 %
Brady Statistic AS VP Percent: 98.04 %
Brady Statistic AS VS Percent: 1.87 %
Brady Statistic RA Percent Paced: 0.09 %
Brady Statistic RV Percent Paced: 97.63 %
Date Time Interrogation Session: 20200729142303
HighPow Impedance: 76 Ohm
Implantable Lead Implant Date: 20190124
Implantable Lead Implant Date: 20190124
Implantable Lead Implant Date: 20190124
Implantable Lead Location: 753859
Implantable Lead Location: 753860
Implantable Lead Location: 753860
Implantable Lead Model: 3830
Implantable Lead Model: 5076
Implantable Lead Model: 6935
Implantable Pulse Generator Implant Date: 20190124
Lead Channel Impedance Value: 209 Ohm
Lead Channel Impedance Value: 304 Ohm
Lead Channel Impedance Value: 380 Ohm
Lead Channel Impedance Value: 437 Ohm
Lead Channel Impedance Value: 456 Ohm
Lead Channel Impedance Value: 570 Ohm
Lead Channel Pacing Threshold Amplitude: 0.5 V
Lead Channel Pacing Threshold Amplitude: 0.75 V
Lead Channel Pacing Threshold Amplitude: 1.25 V
Lead Channel Pacing Threshold Pulse Width: 0.4 ms
Lead Channel Pacing Threshold Pulse Width: 0.4 ms
Lead Channel Pacing Threshold Pulse Width: 0.5 ms
Lead Channel Sensing Intrinsic Amplitude: 2 mV
Lead Channel Sensing Intrinsic Amplitude: 2 mV
Lead Channel Sensing Intrinsic Amplitude: 9.25 mV
Lead Channel Sensing Intrinsic Amplitude: 9.25 mV
Lead Channel Setting Pacing Amplitude: 1.25 V
Lead Channel Setting Pacing Amplitude: 1.5 V
Lead Channel Setting Pacing Amplitude: 2.5 V
Lead Channel Setting Pacing Pulse Width: 0.4 ms
Lead Channel Setting Pacing Pulse Width: 0.5 ms
Lead Channel Setting Sensing Sensitivity: 0.3 mV

## 2018-08-01 ENCOUNTER — Telehealth: Payer: Self-pay | Admitting: Internal Medicine

## 2018-08-01 NOTE — Telephone Encounter (Signed)
New message:    Patient calling concering her monitor. Please call patient.

## 2018-08-01 NOTE — Telephone Encounter (Signed)
I called the pt and let her know we did receive her transmission she sent. She thanked me for the call.

## 2018-08-01 NOTE — Telephone Encounter (Signed)
Per pt has sent a device transmission in ./[place]

## 2018-08-14 NOTE — Progress Notes (Signed)
Remote ICD transmission.   

## 2018-09-02 ENCOUNTER — Telehealth: Payer: Self-pay | Admitting: Cardiology

## 2018-09-02 NOTE — Telephone Encounter (Signed)
° ° °  Please return call to patient °

## 2018-09-02 NOTE — Telephone Encounter (Signed)
LMOVM for pt to return call. In regards to home monitor education.

## 2018-09-03 NOTE — Telephone Encounter (Signed)
LMOVM for pt to return. 

## 2018-09-03 NOTE — Telephone Encounter (Signed)
Informed pt that she doesn't have to send a manual transmission unless the office sends a letter or calls and ask her to. Pt stated that she sent the transmission b/c the last time it was scheduled it did not go through. I again informed her that she doesn't have to send unless the office informs her to. Pt stated that she sent it b/c she wasn't going to be home on the date it was scheduled.

## 2018-10-29 ENCOUNTER — Ambulatory Visit (INDEPENDENT_AMBULATORY_CARE_PROVIDER_SITE_OTHER): Payer: Medicare Other | Admitting: *Deleted

## 2018-10-29 DIAGNOSIS — I428 Other cardiomyopathies: Secondary | ICD-10-CM | POA: Diagnosis not present

## 2018-10-29 DIAGNOSIS — I5023 Acute on chronic systolic (congestive) heart failure: Secondary | ICD-10-CM

## 2018-10-30 LAB — CUP PACEART REMOTE DEVICE CHECK
Battery Remaining Longevity: 66 mo
Battery Voltage: 2.97 V
Brady Statistic AP VP Percent: 0.03 %
Brady Statistic AP VS Percent: 0.01 %
Brady Statistic AS VP Percent: 99.92 %
Brady Statistic AS VS Percent: 0.04 %
Brady Statistic RA Percent Paced: 0.04 %
Brady Statistic RV Percent Paced: 99.63 %
Date Time Interrogation Session: 20201027073523
HighPow Impedance: 74 Ohm
Implantable Lead Implant Date: 20190124
Implantable Lead Implant Date: 20190124
Implantable Lead Implant Date: 20190124
Implantable Lead Location: 753859
Implantable Lead Location: 753860
Implantable Lead Location: 753860
Implantable Lead Model: 3830
Implantable Lead Model: 5076
Implantable Lead Model: 6935
Implantable Pulse Generator Implant Date: 20190124
Lead Channel Impedance Value: 209 Ohm
Lead Channel Impedance Value: 304 Ohm
Lead Channel Impedance Value: 380 Ohm
Lead Channel Impedance Value: 437 Ohm
Lead Channel Impedance Value: 437 Ohm
Lead Channel Impedance Value: 494 Ohm
Lead Channel Pacing Threshold Amplitude: 0.5 V
Lead Channel Pacing Threshold Amplitude: 0.75 V
Lead Channel Pacing Threshold Amplitude: 0.875 V
Lead Channel Pacing Threshold Pulse Width: 0.4 ms
Lead Channel Pacing Threshold Pulse Width: 0.4 ms
Lead Channel Pacing Threshold Pulse Width: 0.5 ms
Lead Channel Sensing Intrinsic Amplitude: 0.25 mV
Lead Channel Sensing Intrinsic Amplitude: 0.25 mV
Lead Channel Sensing Intrinsic Amplitude: 9.375 mV
Lead Channel Sensing Intrinsic Amplitude: 9.375 mV
Lead Channel Setting Pacing Amplitude: 1.25 V
Lead Channel Setting Pacing Amplitude: 1.5 V
Lead Channel Setting Pacing Amplitude: 2.5 V
Lead Channel Setting Pacing Pulse Width: 0.4 ms
Lead Channel Setting Pacing Pulse Width: 0.5 ms
Lead Channel Setting Sensing Sensitivity: 0.3 mV

## 2018-11-19 NOTE — Progress Notes (Signed)
Remote ICD transmission.   

## 2018-12-05 ENCOUNTER — Other Ambulatory Visit: Payer: Self-pay

## 2018-12-05 ENCOUNTER — Other Ambulatory Visit: Payer: Self-pay | Admitting: Interventional Cardiology

## 2018-12-05 MED ORDER — LOSARTAN POTASSIUM 25 MG PO TABS: 12.5000 mg | ORAL_TABLET | Freq: Every day | ORAL | 1 refills | Status: DC

## 2018-12-05 MED ORDER — ATORVASTATIN CALCIUM 80 MG PO TABS: 80.0000 mg | ORAL_TABLET | Freq: Every day | ORAL | 3 refills | Status: DC

## 2019-01-22 ENCOUNTER — Other Ambulatory Visit: Payer: Self-pay | Admitting: Interventional Cardiology

## 2019-01-22 MED ORDER — NEXLETOL 180 MG PO TABS: 180.0000 mg | ORAL_TABLET | Freq: Every day | ORAL | 3 refills | Status: DC

## 2019-01-22 NOTE — Telephone Encounter (Signed)
Pt's pharmacy is requesting a refill on bempedoic acid. Would Dr. Eldridge Dace like to refill this medication? Please address

## 2019-01-22 NOTE — Telephone Encounter (Signed)
Patient is on nexletol for cholesterol. Refill sent in.

## 2019-01-28 ENCOUNTER — Ambulatory Visit (INDEPENDENT_AMBULATORY_CARE_PROVIDER_SITE_OTHER): Payer: Medicare Other | Admitting: *Deleted

## 2019-01-28 DIAGNOSIS — I428 Other cardiomyopathies: Secondary | ICD-10-CM | POA: Diagnosis not present

## 2019-01-28 LAB — CUP PACEART REMOTE DEVICE CHECK
Battery Remaining Longevity: 61 mo
Battery Voltage: 2.97 V
Brady Statistic AP VP Percent: 0.03 %
Brady Statistic AP VS Percent: 0.01 %
Brady Statistic AS VP Percent: 99.7 %
Brady Statistic AS VS Percent: 0.25 %
Brady Statistic RA Percent Paced: 0.04 %
Brady Statistic RV Percent Paced: 99.51 %
Date Time Interrogation Session: 20210126033424
HighPow Impedance: 66 Ohm
Implantable Lead Implant Date: 20190124
Implantable Lead Implant Date: 20190124
Implantable Lead Implant Date: 20190124
Implantable Lead Location: 753859
Implantable Lead Location: 753860
Implantable Lead Location: 753860
Implantable Lead Model: 3830
Implantable Lead Model: 5076
Implantable Lead Model: 6935
Implantable Pulse Generator Implant Date: 20190124
Lead Channel Impedance Value: 190 Ohm
Lead Channel Impedance Value: 266 Ohm
Lead Channel Impedance Value: 380 Ohm
Lead Channel Impedance Value: 399 Ohm
Lead Channel Impedance Value: 437 Ohm
Lead Channel Impedance Value: 513 Ohm
Lead Channel Pacing Threshold Amplitude: 0.375 V
Lead Channel Pacing Threshold Amplitude: 0.75 V
Lead Channel Pacing Threshold Amplitude: 1 V
Lead Channel Pacing Threshold Pulse Width: 0.4 ms
Lead Channel Pacing Threshold Pulse Width: 0.4 ms
Lead Channel Pacing Threshold Pulse Width: 0.5 ms
Lead Channel Sensing Intrinsic Amplitude: 2.375 mV
Lead Channel Sensing Intrinsic Amplitude: 2.375 mV
Lead Channel Sensing Intrinsic Amplitude: 8.25 mV
Lead Channel Sensing Intrinsic Amplitude: 8.25 mV
Lead Channel Setting Pacing Amplitude: 1.25 V
Lead Channel Setting Pacing Amplitude: 1.5 V
Lead Channel Setting Pacing Amplitude: 2.5 V
Lead Channel Setting Pacing Pulse Width: 0.4 ms
Lead Channel Setting Pacing Pulse Width: 0.5 ms
Lead Channel Setting Sensing Sensitivity: 0.3 mV

## 2019-02-06 ENCOUNTER — Other Ambulatory Visit: Payer: Self-pay | Admitting: Interventional Cardiology

## 2019-02-06 MED ORDER — ATORVASTATIN CALCIUM 80 MG PO TABS: 80.0000 mg | ORAL_TABLET | Freq: Every day | ORAL | 0 refills | Status: DC

## 2019-02-20 ENCOUNTER — Telehealth: Payer: Self-pay | Admitting: Internal Medicine

## 2019-02-20 DIAGNOSIS — Z720 Tobacco use: Secondary | ICD-10-CM

## 2019-02-20 MED ORDER — NICOTINE 7 MG/24HR TD PT24: 7.0000 mg | MEDICATED_PATCH | Freq: Every day | TRANSDERMAL | 0 refills | Status: DC

## 2019-02-20 NOTE — Telephone Encounter (Signed)
New Message    1. Has your device fired? no  2. Is you device beeping? No, but its playing music  3. Are you experiencing draining or swelling at device site? no  4. Are you calling to see if we received your device transmission? no  5. Have you passed out? no    Please route to Device Clinic Pool

## 2019-02-20 NOTE — Telephone Encounter (Signed)
Pt called due to ICD playing "music" this am.  Transmission received. No alerts. Battery longevity 4.8 yrs.   Pt states she unplugged her monitor yesterday and moved it.    I asked patient about possible magnet contacts; she states she was leaning up against the refrigerator cleaning the top when it happened, and there are multiple magnets she may have touched.    Pt also asks for another prescription of nicotine patches, as she has had good success with them in the past. I told her I will send her in today, and she should discuss with her PCP or gen cardiologist for further.   Pt knows to call back with any further questions, or if she has further ICD alarms in the absence of contact with a magnet.   Casimiro Needle [address] [place], PA-C  02/20/2019 1:32 PM

## 2019-03-02 ENCOUNTER — Telehealth: Payer: Self-pay | Admitting: Internal Medicine

## 2019-03-02 DIAGNOSIS — E785 Hyperlipidemia, unspecified: Secondary | ICD-10-CM

## 2019-03-02 NOTE — Telephone Encounter (Signed)
Cardiology Moonlighter Note  Returned page from patient. Last week her pacemaker starting "singing music." Remote transmission showing normal battery life and no alerts.   Today patient is having leg cramps and pain that is moving all over her body (abdomen, shoulder, chest, back). Pain comes and goes. Never in the same place, moving all over the body. Never felt this way before. Using OTC pain patches for her symptoms. Doesn't want to take "pain pills."   Patient is worried about her kidneys. Started Nexetol recently and wonders whether this is the cause of her symptoms.   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 She is requesting to be seen tomorrow for an ECG and blood work. I told her that I would send a message to her cardiologist and pass along this request. She understands that she should come to the ED if her symptoms worsen or if she develops new symptoms.   Rosario Jacks, MD Cardiology Fellow, PGY-7

## 2019-03-04 NOTE — Telephone Encounter (Signed)
Has she had recent blood work, Nutritional therapist, with PMD?

## 2019-03-04 NOTE — Telephone Encounter (Signed)
Discussed with Dr. Eldridge Dace. We will have patient come in for CMET and LIPIDS and EKG on Friday 03/07/19. Patient made her aware. She verbalized understanding and thanked me for the call.

## 2019-03-04 NOTE — Addendum Note (Signed)
Addended by: Daleen Bo I on: 03/04/2019 02:27 PM   Modules accepted: Orders

## 2019-03-07 ENCOUNTER — Other Ambulatory Visit: Payer: Self-pay

## 2019-03-07 ENCOUNTER — Other Ambulatory Visit: Payer: Medicare Other | Admitting: *Deleted

## 2019-03-07 ENCOUNTER — Ambulatory Visit (INDEPENDENT_AMBULATORY_CARE_PROVIDER_SITE_OTHER): Payer: Medicare Other

## 2019-03-07 VITALS — BP 120/78 | HR 62 | Ht 61.0 in | Wt 124.0 lb

## 2019-03-07 DIAGNOSIS — Z95810 Presence of automatic (implantable) cardiac defibrillator: Secondary | ICD-10-CM

## 2019-03-07 DIAGNOSIS — E785 Hyperlipidemia, unspecified: Secondary | ICD-10-CM

## 2019-03-07 DIAGNOSIS — I25118 Atherosclerotic heart disease of native coronary artery with other forms of angina pectoris: Secondary | ICD-10-CM

## 2019-03-07 LAB — LIPID PANEL
Chol/HDL Ratio: 3.3 ratio (ref 0.0–4.4)
Cholesterol, Total: 132 mg/dL (ref 100–199)
HDL: 40 mg/dL (ref >39)
LDL Chol Calc (NIH): 79 mg/dL (ref 0–99)
Triglycerides: 62 mg/dL (ref 0–149)
VLDL Cholesterol Cal: 13 mg/dL (ref 5–40)

## 2019-03-07 LAB — COMPREHENSIVE METABOLIC PANEL
ALT: 15 IU/L (ref 0–32)
AST: 27 IU/L (ref 0–40)
Albumin/Globulin Ratio: 1.5 (ref 1.2–2.2)
Albumin: 4.1 g/dL (ref 3.8–4.9)
Alkaline Phosphatase: 98 IU/L (ref 39–117)
BUN/Creatinine Ratio: 15 (ref 9–23)
BUN: 13 mg/dL (ref 6–24)
Bilirubin Total: 0.4 mg/dL (ref 0.0–1.2)
CO2: 22 mmol/L (ref 20–29)
Calcium: 9.2 mg/dL (ref 8.7–10.2)
Chloride: 104 mmol/L (ref 96–106)
Creatinine, Ser: 0.88 mg/dL (ref 0.57–1.00)
GFR calc Af Amer: 87 mL/min/{1.73_m2} (ref >59)
GFR calc non Af Amer: 75 mL/min/{1.73_m2} (ref >59)
Globulin, Total: 2.7 g/dL (ref 1.5–4.5)
Glucose: 78 mg/dL (ref 65–99)
Potassium: 4.2 mmol/L (ref 3.5–5.2)
Sodium: 141 mmol/L (ref 134–144)
Total Protein: 6.8 g/dL (ref 6.0–8.5)

## 2019-03-07 NOTE — Progress Notes (Signed)
1.) Reason for visit: EKG  2.) Name of MD requesting visit: Varanasi  3.) ROS related to problem: Patient called c/o ICD playing music and leg cramps and pain moving all over her body. Per Dr. Eldridge Dace patient to come in to have an EKG and labs.  4.) Assessment and plan per MD: Patient asymptomatic and hasn't hear her ICD singing to her anymore. EKG reviewed by Dr. Eldridge Dace. No changes at this time. We will wait for lab results.

## 2019-03-10 ENCOUNTER — Telehealth: Payer: Self-pay

## 2019-03-10 NOTE — Telephone Encounter (Signed)
I did a Nicotine Step 3 (7 mg/24hrs patch) PA through covermymeds and received the following message: Lindsay Camacho Key: [id number] - Rx #: [id number]  Outcome  The medication you have requested is not covered by Medicare Part D Law. Drug Nicotine Step 3 7MG /24HR 24 hr patches  10-13-2005 Electronic PA Form  Original Claim InfoA5,70 OTC'S NOT COVERED  I have notified [employer] at [name] of this denial.

## 2019-03-18 ENCOUNTER — Encounter: Payer: Self-pay | Admitting: Internal Medicine

## 2019-03-18 ENCOUNTER — Other Ambulatory Visit: Payer: Self-pay

## 2019-03-18 ENCOUNTER — Ambulatory Visit (INDEPENDENT_AMBULATORY_CARE_PROVIDER_SITE_OTHER): Payer: Medicare Other | Admitting: Internal Medicine

## 2019-03-18 VITALS — BP 160/90 | HR 74 | Ht 61.5 in | Wt 123.4 lb

## 2019-03-18 DIAGNOSIS — Z95810 Presence of automatic (implantable) cardiac defibrillator: Secondary | ICD-10-CM

## 2019-03-18 DIAGNOSIS — I25118 Atherosclerotic heart disease of native coronary artery with other forms of angina pectoris: Secondary | ICD-10-CM

## 2019-03-18 DIAGNOSIS — I340 Nonrheumatic mitral (valve) insufficiency: Secondary | ICD-10-CM

## 2019-03-18 DIAGNOSIS — I5022 Chronic systolic (congestive) heart failure: Secondary | ICD-10-CM

## 2019-03-18 NOTE — Patient Instructions (Signed)

## 2019-03-18 NOTE — Progress Notes (Signed)
HPI Lindsay Camacho returns today for followup of chronic systolic heart failure, s/p ICD insertion. She has a h/o LBB, and tobacco abuse which is now in remission. She has a remote LCX MI with stenting. She has not had any palpitations or ICD therapies. She denies high bp as home. Allergies  Allergen Reactions  . Aldomet [Methyldopa] Other (See Comments)    Severe hypotension  . Brilinta [Ticagrelor] Shortness Of Breath  . Other Other (See Comments)    Nuclear Stress Test Medication caused seizures  . Coumadin [Warfarin Sodium] Swelling    Arm swelled  . Eliquis [Apixaban] Other (See Comments)    BP got too low  . Penicillins Rash    Has patient had a PCN reaction causing immediate rash, facial/tongue/throat swelling, SOB or lightheadedness with hypotension: Yes Has patient had a PCN reaction causing severe rash involving mucus membranes or skin necrosis: No Has patient had a PCN reaction that required hospitalization: No Has patient had a PCN reaction occurring within the last 10 years: No If all of the above answers are "NO", then may proceed with Cephalosporin use.      Current Outpatient Medications  Medication Sig Dispense Refill  . acetaminophen (TYLENOL) 500 MG tablet Take 1,000 mg by mouth every 6 (six) hours as needed for headache (pain).    Marland Kitchen aspirin EC 81 MG tablet Take 81 mg by mouth daily.    Marland Kitchen atorvastatin (LIPITOR) 80 MG tablet Take 1 tablet (80 mg total) by mouth daily. Please make yearly appt with Dr. Irish Lack for April for future refills. 1st attempt 90 tablet 0  . Bempedoic Acid (NEXLETOL) 180 MG TABS Take 180 mg by mouth daily. 30 tablet 3  . carvedilol (COREG) 6.25 MG tablet Take 6.25 mg by mouth 2 (two) times daily with a meal.    . clopidogrel (PLAVIX) 75 MG tablet TAKE 1 TABLET BY MOUTH DAILY WITH BREAKFAST 90 tablet 2  . ezetimibe (ZETIA) 10 MG tablet Take 1 tablet (10 mg total) by mouth daily. 90 tablet 3  . furosemide (LASIX) 40 MG tablet Take 40 mg by  mouth daily as needed for fluid or edema (shortness of breath).     . losartan (COZAAR) 25 MG tablet Take 0.5 tablets (12.5 mg total) by mouth daily. 45 tablet 1  . nicotine (NICODERM CQ) 7 mg/24hr patch Place 1 patch (7 mg total) onto the skin daily. 28 patch 0   No current facility-administered medications for this visit.     Past Medical History:  Diagnosis Date  . Acute myocardial infarction of other lateral wall, initial episode of care 01/2013   DES CFX  . Aortoiliac occlusive disease ([place]) 08/22/2016  . CAD (coronary artery disease) 11/13/2011   50% ? proximal LCx stenosis per Cath at [facility]  . Chest pain, atypical, muscular skelatal   01/09/2014  . Dyspnea   . Heart failure ([place])   . HLD (hyperlipidemia)   . Hypertension   . Intermediate coronary syndrome ([place])   . LBBB (left bundle branch block)   . Mitral regurgitation   . Nonischemic cardiomyopathy ([place]) 01/26/2017  . PVD (peripheral vascular disease) ([place])    Right CFA stenosis per report on 11/14/2011  . 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/25/2016   Edwards McArthy-Adams IMR ETLogix ring annuloplasty (Model 4100, Serial [id number], size 26) placed via right mini thoracotomy approach  . Stenosis of left subclavian artery ([place]) 08/22/2016  . Stroke ([place])    tia  with no deficits    ROS:   All systems reviewed and negative except as noted in the HPI.   Past Surgical History:  Procedure Laterality Date  . APPENDECTOMY    . BIV ICD INSERTION CRT-D N/A 01/24/2017   Procedure: BIV ICD INSERTION CRT-D;  Surgeon: Marinus Maw, MD;  Location[facility] INVASIVE CV LAB;  Service: Cardiovascular;  Laterality: N/A;  . CARDIAC CATHETERIZATION  04/14/2013   Non-obstructive disease, patent CFX stent  . CARDIAC CATHETERIZATION N/A 09/21/2014   Procedure: Left Heart Cath and Coronary Angiography;  Surgeon: Lennette Bihari, MD;  Location: [facility] INVASIVE CV LAB;  Service: Cardiovascular;  Laterality: N/A;  . CORONARY ANGIOPLASTY WITH STENT  PLACEMENT  01/17/2013   mild disease except 99% CFX, rx with  2.5 x 28 Alpine drug-eluting stent   . GROIN DEBRIDEMENT Right 04/14/2013   Procedure: Emergency Evacuation of Retroperitoneal Hematoma and Repair Right External Iliac Artery Pseudoaneurysm    ;  Surgeon: Sherren Kerns, MD;  Location: [facility] OR;  Service: Vascular;  Laterality: Right;  . LEFT HEART CATHETERIZATION WITH CORONARY ANGIOGRAM N/A 01/18/2013   Procedure: LEFT HEART CATHETERIZATION WITH CORONARY ANGIOGRAM;  Surgeon: Corky Crafts, MD;  Location: [facility] CATH LAB;  Service: Cardiovascular;  Laterality: N/A;  . LEFT HEART CATHETERIZATION WITH CORONARY ANGIOGRAM N/A 04/14/2013   Procedure: LEFT HEART CATHETERIZATION WITH CORONARY ANGIOGRAM;  Surgeon: Corky Crafts, MD;  Location: [facility] CATH LAB;  Service: Cardiovascular;  Laterality: N/A;  . MITRAL VALVE REPAIR Right 08/25/2016   Procedure: MINIMALLY INVASIVE MITRAL VALVE REPAIR;  Surgeon: Purcell Nails, MD;  Location: [facility] OR;  Service: Open Heart Surgery;  Laterality: Right;  . MULTIPLE EXTRACTIONS WITH ALVEOLOPLASTY N/A 08/23/2016   Procedure: Extraction of tooth #'s 17, 20-23, 26-29 with alveoloplasty;  Surgeon: [name], DDS;  Location: [facility] OR;  Service: Oral Surgery;  Laterality: N/A;  . PERCUTANEOUS CORONARY STENT INTERVENTION (PCI-S)  01/18/2013   Procedure: PERCUTANEOUS CORONARY STENT INTERVENTION (PCI-S);  Surgeon: Corky Crafts, MD;  Location: [facility] CATH LAB;  Service: Cardiovascular;;  . RIGHT/LEFT HEART CATH AND CORONARY ANGIOGRAPHY N/A 08/21/2016   Procedure: RIGHT/LEFT HEART CATH AND CORONARY ANGIOGRAPHY;  Surgeon: Kathleene Hazel, MD;  Location: [facility] INVASIVE CV LAB;  Service: Cardiovascular;  Laterality: N/A;  . TEE WITHOUT CARDIOVERSION N/A 03/06/2014   Procedure: TRANSESOPHAGEAL ECHOCARDIOGRAM (TEE);  Surgeon: Lars Masson, MD;  Location: [facility] ENDOSCOPY;  Service: Cardiovascular;  Laterality: N/A;  . TEE WITHOUT CARDIOVERSION N/A 08/18/2016    Procedure: TRANSESOPHAGEAL ECHOCARDIOGRAM (TEE);  Surgeon: Lewayne Bunting, MD;  Location: [facility] ENDOSCOPY;  Service: Cardiovascular;  Laterality: N/A;  . TEE WITHOUT CARDIOVERSION N/A 08/25/2016   Procedure: TRANSESOPHAGEAL ECHOCARDIOGRAM (TEE);  Surgeon: Purcell Nails, MD;  Location: [facility] OR;  Service: Open Heart Surgery;  Laterality: N/A;  . TUBAL LIGATION       Family History  Problem Relation Age of Onset  . CAD Mother 17  . Lung cancer Mother   . Bladder Cancer Mother   . Stroke Mother   . Heart disease Mother        Before age 3  . Hypertension Mother   . Heart attack Mother   . CAD Father 35  . Heart disease Father        Before age 27  . Heart attack Father   . Stroke Father        Bleeding stroke     Social History   Socioeconomic History  . Marital status:  Single    Spouse name: Not on file  . Number of children: Not on file  . Years of education: Not on file  . Highest education level: Not on file  Occupational History  . Not on file  Tobacco Use  . Smoking status: Former Smoker    Packs/day: 0.50    Types: Cigarettes    Quit date: 08/19/2015    Years since quitting: 3.5  . Smokeless tobacco: Never Used  . Tobacco comment: i QUIT SMOKING A YEAR AGO, i DONT REMEMBER WHAT MONTH  Substance and Sexual Activity  . Alcohol use: No    Alcohol/week: 0.0 standard drinks  . Drug use: Yes    Types: Marijuana  . Sexual activity: Yes    Birth control/protection: None  Other Topics Concern  . Not on file  Social History Narrative  . Not on file   Social Determinants of Health   Financial Resource Strain:   . Difficulty of Paying Living Expenses:   Food Insecurity:   . Worried About Programme researcher, broadcasting/film/video in the Last Year:   . Barista in the Last Year:   Transportation Needs:   . Freight forwarder (Medical):   Marland Kitchen Lack of Transportation (Non-Medical):   Physical Activity:   . Days of Exercise per Week:   . Minutes of Exercise per Session:     Stress:   . Feeling of Stress :   Social Connections:   . Frequency of Communication with Friends and Family:   . Frequency of Social Gatherings with Friends and Family:   . Attends Religious Services:   . Active Member of Clubs or Organizations:   . Attends Banker Meetings:   Marland Kitchen Marital Status:   Intimate Partner Violence:   . Fear of Current or Ex-Partner:   . Emotionally Abused:   Marland Kitchen Physically Abused:   . Sexually Abused:      BP (!) 160/90   Pulse 74   Ht 5' 1.5" (1.562 m)   Wt 123 lb 6.4 oz (56 kg)   SpO2 97%   BMI 22.94 kg/m   Physical Exam:  Well appearing NAD HEENT: Unremarkable Neck:  No JVD, no thyromegally Lymphatics:  No adenopathy Back:  No CVA tenderness Lungs:  Clear with no wheezes HEART:  Regular rate rhythm, no murmurs, no rubs, no clicks Abd:  soft, positive bowel sounds, no organomegally, no rebound, no guarding Ext:  2 plus pulses, no edema, no cyanosis, no clubbing Skin:  No rashes no nodules Neuro:  CN II through XII intact, motor grossly intact  EKG - nsr with biv pacing  DEVICE  Normal device function.  See PaceArt for details.   Assess/Plan: 1. Chronic systolic heart failure - her symptoms remain class 2. SHe will continue her current meds. 2. ICD - her medtronic biv ICD is working normally. We will recheck in several months. 3. HTN - her bp is high in the office but she states that it runs normal at home and her bp was normal on her visit last year. She is encouraged to avoid salty foods. 4. Peripheral vascular disease - she is over 3 years in remission of her smoking. She denies claudication.   Leonia Reeves.D.

## 2019-03-24 LAB — CUP PACEART INCLINIC DEVICE CHECK
Battery Remaining Longevity: 57 mo
Battery Voltage: 2.96 V
Brady Statistic AP VP Percent: 0.06 %
Brady Statistic AP VS Percent: 0.01 %
Brady Statistic AS VP Percent: 99.29 %
Brady Statistic AS VS Percent: 0.65 %
Brady Statistic RA Percent Paced: 0.07 %
Brady Statistic RV Percent Paced: 99.01 %
Date Time Interrogation Session: 20210316104000
HighPow Impedance: 66 Ohm
Implantable Lead Implant Date: 20190124
Implantable Lead Implant Date: 20190124
Implantable Lead Implant Date: 20190124
Implantable Lead Location: 753859
Implantable Lead Location: 753860
Implantable Lead Location: 753860
Implantable Lead Model: 3830
Implantable Lead Model: 5076
Implantable Lead Model: 6935
Implantable Pulse Generator Implant Date: 20190124
Lead Channel Impedance Value: 209 Ohm
Lead Channel Impedance Value: 323 Ohm
Lead Channel Impedance Value: 399 Ohm
Lead Channel Impedance Value: 399 Ohm
Lead Channel Impedance Value: 456 Ohm
Lead Channel Impedance Value: 513 Ohm
Lead Channel Pacing Threshold Amplitude: 0.5 V
Lead Channel Pacing Threshold Amplitude: 0.75 V
Lead Channel Pacing Threshold Amplitude: 1 V
Lead Channel Pacing Threshold Pulse Width: 0.4 ms
Lead Channel Pacing Threshold Pulse Width: 0.4 ms
Lead Channel Pacing Threshold Pulse Width: 0.5 ms
Lead Channel Sensing Intrinsic Amplitude: 1.8 mV
Lead Channel Sensing Intrinsic Amplitude: 8 mV
Lead Channel Setting Pacing Amplitude: 1.25 V
Lead Channel Setting Pacing Amplitude: 1.5 V
Lead Channel Setting Pacing Amplitude: 2.5 V
Lead Channel Setting Pacing Pulse Width: 0.4 ms
Lead Channel Setting Pacing Pulse Width: 0.5 ms
Lead Channel Setting Sensing Sensitivity: 0.3 mV

## 2019-04-29 ENCOUNTER — Ambulatory Visit (INDEPENDENT_AMBULATORY_CARE_PROVIDER_SITE_OTHER): Payer: Medicare Other | Admitting: *Deleted

## 2019-04-29 DIAGNOSIS — I428 Other cardiomyopathies: Secondary | ICD-10-CM

## 2019-04-29 LAB — CUP PACEART REMOTE DEVICE CHECK
Battery Remaining Longevity: 54 mo
Battery Voltage: 2.97 V
Brady Statistic AP VP Percent: 0.04 %
Brady Statistic AP VS Percent: 0.01 %
Brady Statistic AS VP Percent: 99.27 %
Brady Statistic AS VS Percent: 0.68 %
Brady Statistic RA Percent Paced: 0.05 %
Brady Statistic RV Percent Paced: 99.13 %
Date Time Interrogation Session: 20210426171954
HighPow Impedance: 79 Ohm
Implantable Lead Implant Date: 20190124
Implantable Lead Implant Date: 20190124
Implantable Lead Implant Date: 20190124
Implantable Lead Location: 753859
Implantable Lead Location: 753860
Implantable Lead Location: 753860
Implantable Lead Model: 3830
Implantable Lead Model: 5076
Implantable Lead Model: 6935
Implantable Pulse Generator Implant Date: 20190124
Lead Channel Impedance Value: 247 Ohm
Lead Channel Impedance Value: 304 Ohm
Lead Channel Impedance Value: 437 Ohm
Lead Channel Impedance Value: 437 Ohm
Lead Channel Impedance Value: 437 Ohm
Lead Channel Impedance Value: 570 Ohm
Lead Channel Pacing Threshold Amplitude: 0.5 V
Lead Channel Pacing Threshold Amplitude: 0.625 V
Lead Channel Pacing Threshold Amplitude: 1 V
Lead Channel Pacing Threshold Pulse Width: 0.4 ms
Lead Channel Pacing Threshold Pulse Width: 0.4 ms
Lead Channel Pacing Threshold Pulse Width: 0.5 ms
Lead Channel Sensing Intrinsic Amplitude: 2.5 mV
Lead Channel Sensing Intrinsic Amplitude: 2.5 mV
Lead Channel Sensing Intrinsic Amplitude: 8.25 mV
Lead Channel Sensing Intrinsic Amplitude: 8.25 mV
Lead Channel Setting Pacing Amplitude: 1.25 V
Lead Channel Setting Pacing Amplitude: 1.5 V
Lead Channel Setting Pacing Amplitude: 2.5 V
Lead Channel Setting Pacing Pulse Width: 0.4 ms
Lead Channel Setting Pacing Pulse Width: 0.5 ms
Lead Channel Setting Sensing Sensitivity: 0.3 mV

## 2019-04-30 NOTE — Progress Notes (Signed)
ICD Remote  

## 2019-05-27 ENCOUNTER — Telehealth: Payer: Self-pay

## 2019-05-27 NOTE — Telephone Encounter (Signed)
I started a Nexletal PA through covermymeds. Key: Lindsay Camacho

## 2019-05-29 ENCOUNTER — Telehealth: Payer: Self-pay | Admitting: Interventional Cardiology

## 2019-05-29 NOTE — Telephone Encounter (Signed)
Follow Up  Patient is calling in to check on medication. Patient would like to speak with Meagan. Please give patient a call back.

## 2019-05-29 NOTE — Telephone Encounter (Signed)
The pt is advised that this Nexletol PA has been approved and that I have made [facility] aware of this approval as well.  She thanked me for calling her back with this update.

## 2019-05-29 NOTE — Telephone Encounter (Signed)
No message needed °

## 2019-05-29 NOTE — Telephone Encounter (Addendum)
**Note De-Identified Davonta Stroot Obfuscation** Letter received from [facility] stating that they approved the pts non-formulary Nexletol PA. Approval good from 02/26/2019 until 05/26/2020 Member ID: [id number]  I have notified [facility] of this approval.

## 2019-06-23 ENCOUNTER — Other Ambulatory Visit: Payer: Self-pay | Admitting: Interventional Cardiology

## 2019-08-06 ENCOUNTER — Other Ambulatory Visit: Payer: Self-pay | Admitting: Interventional Cardiology

## 2019-08-06 ENCOUNTER — Telehealth: Payer: Self-pay | Admitting: Internal Medicine

## 2019-08-06 NOTE — Telephone Encounter (Signed)
Spoke with pt, she missed transmission in July,  States she is not at home now but last time she tried to send transmission she got an error code.  Advised if another error code received to call medtronic tech support,.

## 2019-08-06 NOTE — Telephone Encounter (Signed)
Follow Up:      Pt said she was left a message a few days ago to call back,

## 2019-08-14 ENCOUNTER — Other Ambulatory Visit: Payer: Self-pay | Admitting: Interventional Cardiology

## 2019-08-15 ENCOUNTER — Telehealth: Payer: Self-pay | Admitting: Internal Medicine

## 2019-08-15 NOTE — Telephone Encounter (Signed)
Lindsay Camacho is calling stating she noticed a knot has come up under her left knee cap. She noticed it the day before yesterday & is unsure if it is swelling or could possibly be a blood clot. Lindsay Camacho states it is not painful and she has been putting a heating pad on it. Please advise.

## 2019-08-15 NOTE — Telephone Encounter (Signed)
Attempted to contact patient bu there was no answer and VM did not pick up.

## 2019-08-20 NOTE — Telephone Encounter (Signed)
Left message for patient to call back  

## 2019-08-20 NOTE — Telephone Encounter (Signed)
Patient called back returning Brittany's Call  

## 2019-08-21 NOTE — Telephone Encounter (Signed)
Returned call to patient. She states that she has had a knot the size of a silver dollar on her left knee below knee cap x1 week. She is concerned about having a blood clot. She denies having any pain, discoloration, recent injury, SOB, chest pain, or any other Sx. She states that she feels fine. BP 128/69 HR 72. Made patient aware that I did not feel like it was a blood clot and advised her to follow up with PCP. Instructed patient to let us know if her Sx change or worsen.

## 2019-08-21 NOTE — Telephone Encounter (Signed)
Follow up   Pt is calling back to follow up, she said she her knee is getting worst and need to see Dr. Eldridge Dace or Dr. Ladona Ridgel. Gave first available with APP 09/14, pt said if its a blood clot she cant wait that long.

## 2019-10-07 ENCOUNTER — Ambulatory Visit (INDEPENDENT_AMBULATORY_CARE_PROVIDER_SITE_OTHER): Payer: Medicare HMO

## 2019-10-07 DIAGNOSIS — I428 Other cardiomyopathies: Secondary | ICD-10-CM

## 2019-10-08 LAB — CUP PACEART REMOTE DEVICE CHECK
Battery Remaining Longevity: 41 mo
Battery Voltage: 2.95 V
Brady Statistic AP VP Percent: 0.21 %
Brady Statistic AP VS Percent: 0.02 %
Brady Statistic AS VP Percent: 99.69 %
Brady Statistic AS VS Percent: 0.08 %
Brady Statistic RA Percent Paced: 0.23 %
Brady Statistic RV Percent Paced: 99.46 %
Date Time Interrogation Session: 20211004143010
HighPow Impedance: 83 Ohm
Implantable Lead Implant Date: 20190124
Implantable Lead Implant Date: 20190124
Implantable Lead Implant Date: 20190124
Implantable Lead Location: 753859
Implantable Lead Location: 753860
Implantable Lead Location: 753860
Implantable Lead Model: 3830
Implantable Lead Model: 5076
Implantable Lead Model: 6935
Implantable Pulse Generator Implant Date: 20190124
Lead Channel Impedance Value: 209 Ohm
Lead Channel Impedance Value: 323 Ohm
Lead Channel Impedance Value: 437 Ohm
Lead Channel Impedance Value: 456 Ohm
Lead Channel Impedance Value: 456 Ohm
Lead Channel Impedance Value: 570 Ohm
Lead Channel Pacing Threshold Amplitude: 0.375 V
Lead Channel Pacing Threshold Amplitude: 0.75 V
Lead Channel Pacing Threshold Amplitude: 1 V
Lead Channel Pacing Threshold Pulse Width: 0.4 ms
Lead Channel Pacing Threshold Pulse Width: 0.4 ms
Lead Channel Pacing Threshold Pulse Width: 0.5 ms
Lead Channel Sensing Intrinsic Amplitude: 10.875 mV
Lead Channel Sensing Intrinsic Amplitude: 10.875 mV
Lead Channel Sensing Intrinsic Amplitude: 2.5 mV
Lead Channel Sensing Intrinsic Amplitude: 2.5 mV
Lead Channel Setting Pacing Amplitude: 1.25 V
Lead Channel Setting Pacing Amplitude: 1.5 V
Lead Channel Setting Pacing Amplitude: 2.5 V
Lead Channel Setting Pacing Pulse Width: 0.4 ms
Lead Channel Setting Pacing Pulse Width: 0.5 ms
Lead Channel Setting Sensing Sensitivity: 0.3 mV

## 2019-10-10 NOTE — Progress Notes (Signed)
Remote ICD transmission.   

## 2019-10-13 ENCOUNTER — Other Ambulatory Visit: Payer: Self-pay | Admitting: Interventional Cardiology

## 2019-10-13 NOTE — Telephone Encounter (Signed)
  *  STAT* If patient is at the pharmacy, call can be transferred to refill team.   1. Which medications need to be refilled? (please list name of each medication and dose if known)   atorvastatin (LIPITOR) 80 MG tablet    carvedilol (COREG) 6.25 MG tablet    nicotine (NICODERM CQ) 7 mg/24hr patch     2. Which pharmacy/location (including street and city if local pharmacy) is medication to be sent to?[facility] [place], [place] - [place] - [address]  3. Do they need a 30 day or 90 day supply? 90 days

## 2019-10-14 ENCOUNTER — Telehealth: Payer: Self-pay

## 2019-10-14 ENCOUNTER — Other Ambulatory Visit: Payer: Self-pay

## 2019-10-14 DIAGNOSIS — Z720 Tobacco use: Secondary | ICD-10-CM

## 2019-10-14 MED ORDER — NICOTINE 7 MG/24HR TD PT24: 7.0000 mg | MEDICATED_PATCH | Freq: Every day | TRANSDERMAL | 0 refills | Status: DC

## 2019-10-14 MED ORDER — CARVEDILOL 6.25 MG PO TABS: 6.2500 mg | ORAL_TABLET | Freq: Two times a day (BID) | ORAL | 0 refills | Status: DC

## 2019-10-14 MED ORDER — ATORVASTATIN CALCIUM 80 MG PO TABS: 80.0000 mg | ORAL_TABLET | Freq: Every day | ORAL | 0 refills | Status: DC

## 2019-10-14 NOTE — Progress Notes (Signed)
Cardiology Office Note   Date:  10/15/2019   ID:  Lindsay Camacho, DOB 1965-05-25, MRN [id number]  PCP:  Montez Hageman, DO    No chief complaint on file.  CAD  Wt Readings from Last 3 Encounters:  10/15/19 117 lb (53.1 kg)  03/18/19 123 lb 6.4 oz (56 kg)  03/07/19 124 lb (56.2 kg)       History of Present Illness: Lindsay Camacho is a 54 y.o. female with ah/o CAD s/plateral wall MI in 2015treated with PCI to the CXF. She had a repeat cath in 09/2014 showing patent stent. She unfortunately had a RP bleed post cath and required vascular surgery. She had another R/LHC prior to valve surgery 08/21/16 that showed patent CFX stent with minal restenosis + mild nonobstructive CAD in the RCA and LAD. She also has h/o chronic systolic HF w/ EF of 35-40%, severe MR and tobacco abuse.   She underwent minimally invasive mitral valve repair, per Dr. Cornelius Moras, for severe symptomatic MR. Surgery was 08/25/16. Post op recovery was fairly uneventfully.   [place] with worsening SOB r/t A/C systolic heart failure. She was diuresed with IV lasix and was transitioned to PO. She was transferred on 1/20 for ICD evaluation after an echo showed worsening EF of 20-25% (35-40% 09/2016). She did not require further diuresis.SuccessfulMedtronicICD implantation on 1/23but unsuccessful LV leaddue to anatomy. Has HIS and RV lead.  CHF meds have been limited by low BP in the past.   She notes that when she gives her Praluent injection on the right side, her right foot hurts when she walks.  If she tries the left side, the same thing happens with the left foot.  Since the last visit, she feels well.    Unable to get nicotine patches- not covered on her insurance.  Has gone back to smoking.    She noticed a soft growth on her left leg.    BP at home typically in the 110-120/80 range.      Past Medical History:  Diagnosis Date  . Acute myocardial infarction of  other lateral wall, initial episode of care 01/2013   DES CFX  . Aortoiliac occlusive disease ([facility]) 08/22/2016  . CAD (coronary artery disease) 11/13/2011   50% ? proximal LCx stenosis per Cath at [facility]  . Chest pain, atypical, muscular skelatal   01/09/2014  . Dyspnea   . Heart failure ([facility])   . HLD (hyperlipidemia)   . Hypertension   . Intermediate coronary syndrome ([facility])   . LBBB (left bundle branch block)   . Mitral regurgitation   . Nonischemic cardiomyopathy ([facility]) 01/26/2017  . PVD (peripheral vascular disease) ([facility])    Right CFA stenosis per report on 11/14/2011  . 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/25/2016   Edwards McArthy-Adams IMR ETLogix ring annuloplasty (Model 4100, Serial [id number], size 26) placed via right mini thoracotomy approach  . Stenosis of left subclavian artery ([facility]) 08/22/2016  . Stroke [facility])    tia with no deficits    Past Surgical History:  Procedure Laterality Date  . APPENDECTOMY    . BIV ICD INSERTION CRT-D N/A 01/24/2017   Procedure: BIV ICD INSERTION CRT-D;  Surgeon: Marinus Maw, MD;  Location: [facility] INVASIVE CV LAB;  Service: Cardiovascular;  Laterality: N/A;  . CARDIAC CATHETERIZATION  04/14/2013   Non-obstructive disease, patent CFX stent  . CARDIAC CATHETERIZATION N/A 09/21/2014   Procedure: Left Heart Cath and Coronary  Angiography;  Surgeon: Lennette Bihari, MD;  Location: [facility] INVASIVE CV LAB;  Service: Cardiovascular;  Laterality: N/A;  . CORONARY ANGIOPLASTY WITH STENT PLACEMENT  01/17/2013   mild disease except 99% CFX, rx with  2.5 x 28 Alpine drug-eluting stent   . GROIN DEBRIDEMENT Right 04/14/2013   Procedure: Emergency Evacuation of Retroperitoneal Hematoma and Repair Right External Iliac Artery Pseudoaneurysm    ;  Surgeon: Sherren Kerns, MD;  Location: [facility] OR;  Service: Vascular;  Laterality: Right;  . LEFT HEART CATHETERIZATION WITH CORONARY ANGIOGRAM N/A 01/18/2013   Procedure: LEFT HEART CATHETERIZATION WITH CORONARY ANGIOGRAM;   Surgeon: Corky Crafts, MD;  Location: [facility] CATH LAB;  Service: Cardiovascular;  Laterality: N/A;  . LEFT HEART CATHETERIZATION WITH CORONARY ANGIOGRAM N/A 04/14/2013   Procedure: LEFT HEART CATHETERIZATION WITH CORONARY ANGIOGRAM;  Surgeon: Corky Crafts, MD;  Location: [facility] CATH LAB;  Service: Cardiovascular;  Laterality: N/A;  . MITRAL VALVE REPAIR Right 08/25/2016   Procedure: MINIMALLY INVASIVE MITRAL VALVE REPAIR;  Surgeon: Purcell Nails, MD;  Location: [facility] OR;  Service: Open Heart Surgery;  Laterality: Right;  . MULTIPLE EXTRACTIONS WITH ALVEOLOPLASTY N/A 08/23/2016   Procedure: Extraction of tooth #'s 17, 20-23, 26-29 with alveoloplasty;  Surgeon: [name], DDS;  Location: [facility] OR;  Service: Oral Surgery;  Laterality: N/A;  . PERCUTANEOUS CORONARY STENT INTERVENTION (PCI-S)  01/18/2013   Procedure: PERCUTANEOUS CORONARY STENT INTERVENTION (PCI-S);  Surgeon: Corky Crafts, MD;  Location: [facility] CATH LAB;  Service: Cardiovascular;;  . RIGHT/LEFT HEART CATH AND CORONARY ANGIOGRAPHY N/A 08/21/2016   Procedure: RIGHT/LEFT HEART CATH AND CORONARY ANGIOGRAPHY;  Surgeon: Kathleene Hazel, MD;  Location: [facility] INVASIVE CV LAB;  Service: Cardiovascular;  Laterality: N/A;  . TEE WITHOUT CARDIOVERSION N/A 03/06/2014   Procedure: TRANSESOPHAGEAL ECHOCARDIOGRAM (TEE);  Surgeon: Lars Masson, MD;  Location: [facility] ENDOSCOPY;  Service: Cardiovascular;  Laterality: N/A;  . TEE WITHOUT CARDIOVERSION N/A 08/18/2016   Procedure: TRANSESOPHAGEAL ECHOCARDIOGRAM (TEE);  Surgeon: Lewayne Bunting, MD;  [facility];  Service: Cardiovascular;  Laterality: N/A;  . TEE WITHOUT CARDIOVERSION N/A 08/25/2016   Procedure: TRANSESOPHAGEAL ECHOCARDIOGRAM (TEE);  Surgeon: Purcell Nails, MD;  Location: [facility] OR;  Service: Open Heart Surgery;  Laterality: N/A;  . TUBAL LIGATION       Current Outpatient Medications  Medication Sig Dispense Refill  . acetaminophen (TYLENOL) 500 MG tablet Take  1,000 mg by mouth every 6 (six) hours as needed for headache (pain).    Marland Kitchen aspirin EC 81 MG tablet Take 81 mg by mouth daily.    Marland Kitchen atorvastatin (LIPITOR) 80 MG tablet Take 1 tablet (80 mg total) by mouth daily. Please make overdue appt with Dr. Eldridge Dace before anymore refills. 3rd and Final attempt 15 tablet 0  . Bempedoic Acid (NEXLETOL) 180 MG TABS Take 180 mg by mouth daily. 30 tablet 3  . carvedilol (COREG) 6.25 MG tablet Take 1 tablet (6.25 mg total) by mouth 2 (two) times daily with a meal. 60 tablet 0  . clopidogrel (PLAVIX) 75 MG tablet Take 1 tablet (75 mg total) by mouth daily. Please make overdue appt with Dr. Eldridge Dace before anymore refills. 2nd attempt 15 tablet 0  . ezetimibe (ZETIA) 10 MG tablet Take 1 tablet (10 mg total) by mouth daily. 90 tablet 3  . furosemide (LASIX) 40 MG tablet Take 40 mg by mouth daily as needed for fluid or edema (shortness of breath).     . losartan (COZAAR) 25 MG tablet  TAKE 1/2 TABLET BY MOUTH DAILY 45 tablet 1  . nicotine (NICODERM CQ) 7 mg/24hr patch Place 1 patch (7 mg total) onto the skin daily. 28 patch 0   No current facility-administered medications for this visit.    Allergies:   Aldomet [methyldopa], Brilinta [ticagrelor], Other, Coumadin [warfarin sodium], Eliquis [apixaban], and Penicillins    Social History:  The patient  reports that she quit smoking about 4 years ago. Her smoking use included cigarettes. She smoked 0.50 packs per day. She has never used smokeless tobacco. She reports current drug use. Drug: Marijuana. She reports that she does not drink alcohol.   Family History:  The patient's family history includes Bladder Cancer in her mother; CAD (age of onset: 77) in her mother; CAD (age of onset: 82) in her father; Heart attack in her father and mother; Heart disease in her father and mother; Hypertension in her mother; Lung cancer in her mother; Stroke in her father and mother.    ROS:  Please see the history of present illness.    Otherwise, review of systems are positive for a small area of swelling on her left knee.   All other systems are reviewed and negative.    PHYSICAL EXAM: VS:  BP 140/80   Pulse 82   Ht 5' 1.5" (1.562 m)   Wt 117 lb (53.1 kg)   SpO2 97%   BMI 21.75 kg/m  , BMI Body mass index is 21.75 kg/m. GEN: Well nourished, well developed, in no acute distress  HEENT: normal  Neck: no JVD, carotid bruits, or masses Cardiac: RRR; no murmurs, rubs, or gallops,no edema  Respiratory:  clear to auscultation bilaterally, normal work of breathing GI: soft, nontender, nondistended, + BS MS: no deformity or atrophy  Skin: warm and dry, no rash Neuro:  Strength and sensation are intact Psych: euthymic mood, full affect   EKG:   The ekg ordered today demonstrates    Recent Labs: 03/07/2019: ALT 15; BUN 13; Creatinine, Ser 0.88; Potassium 4.2; Sodium 141   Lipid Panel    Component Value Date/Time   CHOL 132 03/07/2019 0923   TRIG 62 03/07/2019 0923   HDL 40 03/07/2019 0923   CHOLHDL 3.3 03/07/2019 0923   CHOLHDL 3.6 07/23/2018 1017   VLDL 21 07/23/2018 1017   LDLCALC 79 03/07/2019 0923     Other studies Reviewed: Additional studies/ records that were reviewed today with results demonstrating: LDL 79 in 3/21.   ASSESSMENT AND PLAN:  1. CAD/Old MI: No angina.  Continue aggressive secondary prevention.  2. MV repair: SBE prophylaxis.  Upper and lower dentures.  3. Chronic systolic heart failure: s/p BiV ICD.  Appears euvolemic. 4. Hyperlipidemia: LDL 79.  COntinue statin.  Whole fod plant based diet.  5. Tobacco abuse: Will see what patches would be covered for her to stop smoking.  6. Not interested in COVID vaccines.   7. Use heating pad for left knee since this swelling is decreasing.     Current medicines are reviewed at length with the patient today.  The patient concerns regarding her medicines were addressed.  The following changes have been made:  No change  Labs/ tests ordered  today include:  No orders of the defined types were placed in this encounter.   Recommend 150 minutes/week of aerobic exercise Low fat, low carb, high fiber diet recommended  Disposition:   FU in 1 year   Signed, Lance Muss, MD  10/15/2019 10:48 AM    [facility] [address], [place], [place]  [address] Phone: [phone]; Fax: [phone]

## 2019-10-14 NOTE — Telephone Encounter (Signed)
**Note De-Identified Eddy Liszewski Obfuscation** I did a Nexletol PA through covermymeds and received the following message:  LEIANNE CALLINS Key: [id number] - PA Case ID: [id number]   Outcome Approved today  Coverage Starts on: 01/03/2019 12:00:00 AM, Coverage Ends on: 01/01/2021 12:00:00 AM.  Drug Nexletol 180MG  tablets  Electronic PA Form  I have notified Birdena Crandall at [facility] of this approval.

## 2019-10-14 NOTE — Telephone Encounter (Signed)
Refill sent in for 30 days. Patient has been scheduled and appointment with Dr. Eldridge Dace for tomorrow.

## 2019-10-14 NOTE — Telephone Encounter (Signed)
Pt is calling requesting a refill on nicotine. Pt is overdue for an appt with Dr. Ladona Ridgel. Would Dr. Ladona Ridgel like to refill this medication? Please address

## 2019-10-14 NOTE — Telephone Encounter (Signed)
Pt is requesting a refill on carvedilol. This medication has not been refilled since 2017 and pt is overdue for an appt with Dr. Eldridge Dace. Would Dr. Eldridge Dace like to refill this medication? Please address

## 2019-10-15 ENCOUNTER — Encounter: Payer: Self-pay | Admitting: Interventional Cardiology

## 2019-10-15 ENCOUNTER — Ambulatory Visit (INDEPENDENT_AMBULATORY_CARE_PROVIDER_SITE_OTHER): Payer: Medicare HMO | Admitting: Interventional Cardiology

## 2019-10-15 ENCOUNTER — Other Ambulatory Visit: Payer: Self-pay

## 2019-10-15 VITALS — BP 140/80 | HR 82 | Ht 61.5 in | Wt 117.0 lb

## 2019-10-15 DIAGNOSIS — I25118 Atherosclerotic heart disease of native coronary artery with other forms of angina pectoris: Secondary | ICD-10-CM

## 2019-10-15 DIAGNOSIS — Z720 Tobacco use: Secondary | ICD-10-CM

## 2019-10-15 DIAGNOSIS — I5022 Chronic systolic (congestive) heart failure: Secondary | ICD-10-CM

## 2019-10-15 DIAGNOSIS — Z95810 Presence of automatic (implantable) cardiac defibrillator: Secondary | ICD-10-CM

## 2019-10-15 DIAGNOSIS — I340 Nonrheumatic mitral (valve) insufficiency: Secondary | ICD-10-CM

## 2019-10-15 NOTE — Patient Instructions (Signed)
Medication Instructions:  Your physician recommends that you continue on your current medications as directed. Please refer to the Current Medication list given to you today.  *If you need a refill on your cardiac medications before your next appointment, please call your pharmacy*   Lab Work: None  If you have labs (blood work) drawn today and your tests are completely normal, you will receive your results only by: Marland Kitchen MyChart Message (if you have MyChart) OR . A paper copy in the mail If you have any lab test that is abnormal or we need to change your treatment, we will call you to review the results.   Testing/Procedures: None   Follow-Up: At [facility], you and your health needs are our priority.  As part of our continuing mission to provide you with exceptional heart care, we have created designated Provider Care Teams.  These Care Teams include your primary Cardiologist (physician) and Advanced Practice Providers (APPs -  Physician Assistants and Nurse Practitioners) who all work together to provide you with the care you need, when you need it.  We recommend signing up for the patient portal called "MyChart".  Sign up information is provided on this After Visit Summary.  MyChart is used to connect with patients for Virtual Visits (Telemedicine).  Patients are able to view lab/test results, encounter notes, upcoming appointments, etc.  Non-urgent messages can be sent to your provider as well.   To learn more about what you can do with MyChart, go to [email or web address].    Your next appointment:   12 month(s)  The format for your next appointment:   In Person  Provider:   You may see Lance Muss, MD or one of the following Advanced Practice Providers on your designated Care Team:    Ronie Spies, PA-C  Jacolyn Reedy, PA-C    Other Instructions You may use a heating pad for your knee

## 2020-01-06 ENCOUNTER — Ambulatory Visit (INDEPENDENT_AMBULATORY_CARE_PROVIDER_SITE_OTHER): Payer: Medicare Other

## 2020-01-06 DIAGNOSIS — I428 Other cardiomyopathies: Secondary | ICD-10-CM | POA: Diagnosis not present

## 2020-01-06 LAB — CUP PACEART REMOTE DEVICE CHECK
Battery Remaining Longevity: 37 mo
Battery Voltage: 2.95 V
Brady Statistic AP VP Percent: 0.05 %
Brady Statistic AP VS Percent: 0.01 %
Brady Statistic AS VP Percent: 99.87 %
Brady Statistic AS VS Percent: 0.07 %
Brady Statistic RA Percent Paced: 0.06 %
Brady Statistic RV Percent Paced: 99.54 %
Date Time Interrogation Session: 20220104012304
HighPow Impedance: 70 Ohm
Implantable Lead Implant Date: 20190124
Implantable Lead Implant Date: 20190124
Implantable Lead Implant Date: 20190124
Implantable Lead Location: 753859
Implantable Lead Location: 753860
Implantable Lead Location: 753860
Implantable Lead Model: 3830
Implantable Lead Model: 5076
Implantable Lead Model: 6935
Implantable Pulse Generator Implant Date: 20190124
Lead Channel Impedance Value: 209 Ohm
Lead Channel Impedance Value: 304 Ohm
Lead Channel Impedance Value: 380 Ohm
Lead Channel Impedance Value: 437 Ohm
Lead Channel Impedance Value: 437 Ohm
Lead Channel Impedance Value: 570 Ohm
Lead Channel Pacing Threshold Amplitude: 0.5 V
Lead Channel Pacing Threshold Amplitude: 0.75 V
Lead Channel Pacing Threshold Amplitude: 1 V
Lead Channel Pacing Threshold Pulse Width: 0.4 ms
Lead Channel Pacing Threshold Pulse Width: 0.4 ms
Lead Channel Pacing Threshold Pulse Width: 0.5 ms
Lead Channel Sensing Intrinsic Amplitude: 1.625 mV
Lead Channel Sensing Intrinsic Amplitude: 1.625 mV
Lead Channel Sensing Intrinsic Amplitude: 9.375 mV
Lead Channel Sensing Intrinsic Amplitude: 9.375 mV
Lead Channel Setting Pacing Amplitude: 1.25 V
Lead Channel Setting Pacing Amplitude: 1.5 V
Lead Channel Setting Pacing Amplitude: 2.5 V
Lead Channel Setting Pacing Pulse Width: 0.4 ms
Lead Channel Setting Pacing Pulse Width: 0.5 ms
Lead Channel Setting Sensing Sensitivity: 0.3 mV

## 2020-01-20 NOTE — Progress Notes (Signed)
Remote ICD transmission.   

## 2020-02-03 ENCOUNTER — Telehealth: Payer: Self-pay | Admitting: Internal Medicine

## 2020-02-03 NOTE — Telephone Encounter (Signed)
Left message to call back 02/01

## 2020-02-03 NOTE — Telephone Encounter (Signed)
Pt c/o Shortness Of Breath: STAT if SOB developed within the last 24 hours or pt is noticeably SOB on the phone  1. Are you currently SOB (can you hear that pt is SOB on the phone)? No   2. How long have you been experiencing SOB? Patient states she has been SOB since 01/03/2020  3. Are you SOB when sitting or when up moving around?  When up and moving around 4. Are you currently experiencing any other symptoms? Migraines, however, patient assumes the migraines may be due to stress as she has been traveling in the snow.

## 2020-02-08 ENCOUNTER — Emergency Department (HOSPITAL_COMMUNITY): Payer: Medicare Other

## 2020-02-08 ENCOUNTER — Encounter (HOSPITAL_COMMUNITY): Payer: Self-pay | Admitting: Emergency Medicine

## 2020-02-08 ENCOUNTER — Other Ambulatory Visit: Payer: Self-pay

## 2020-02-08 ENCOUNTER — Emergency Department (HOSPITAL_COMMUNITY)
Admission: EM | Admit: 2020-02-08 | Discharge: 2020-02-08 | Disposition: A | Discharge status: Home / Self Care | Payer: Medicare Other | Attending: Emergency Medicine | Admitting: Emergency Medicine

## 2020-02-08 DIAGNOSIS — Z87891 Personal history of nicotine dependence: Secondary | ICD-10-CM | POA: Insufficient documentation

## 2020-02-08 DIAGNOSIS — Z955 Presence of coronary angioplasty implant and graft: Secondary | ICD-10-CM | POA: Diagnosis not present

## 2020-02-08 DIAGNOSIS — R42 Dizziness and giddiness: Secondary | ICD-10-CM | POA: Diagnosis not present

## 2020-02-08 DIAGNOSIS — I5042 Chronic combined systolic (congestive) and diastolic (congestive) heart failure: Secondary | ICD-10-CM | POA: Insufficient documentation

## 2020-02-08 DIAGNOSIS — U071 COVID-19: Secondary | ICD-10-CM | POA: Diagnosis not present

## 2020-02-08 DIAGNOSIS — R079 Chest pain, unspecified: Secondary | ICD-10-CM | POA: Diagnosis present

## 2020-02-08 DIAGNOSIS — Z7982 Long term (current) use of aspirin: Secondary | ICD-10-CM | POA: Diagnosis not present

## 2020-02-08 DIAGNOSIS — I110 Hypertensive heart disease with heart failure: Secondary | ICD-10-CM | POA: Diagnosis not present

## 2020-02-08 DIAGNOSIS — E876 Hypokalemia: Secondary | ICD-10-CM | POA: Insufficient documentation

## 2020-02-08 DIAGNOSIS — Z79899 Other long term (current) drug therapy: Secondary | ICD-10-CM | POA: Insufficient documentation

## 2020-02-08 DIAGNOSIS — I2510 Atherosclerotic heart disease of native coronary artery without angina pectoris: Secondary | ICD-10-CM | POA: Insufficient documentation

## 2020-02-08 DIAGNOSIS — J189 Pneumonia, unspecified organism: Secondary | ICD-10-CM | POA: Diagnosis not present

## 2020-02-08 HISTORY — DX: COVID-19: U07.1

## 2020-02-08 LAB — TROPONIN I (HIGH SENSITIVITY)
Troponin I (High Sensitivity): 15 ng/L (ref <18)
Troponin I (High Sensitivity): 13 ng/L (ref <18)

## 2020-02-08 LAB — BASIC METABOLIC PANEL
Anion gap: 10 (ref 5–15)
BUN: 5 mg/dL — ABNORMAL LOW (ref 6–20)
CO2: 29 mmol/L (ref 22–32)
Calcium: 9.2 mg/dL (ref 8.9–10.3)
Chloride: 101 mmol/L (ref 98–111)
Creatinine, Ser: 0.83 mg/dL (ref 0.44–1.00)
GFR, Estimated: >60 mL/min (ref >60)
Glucose, Bld: 93 mg/dL (ref 70–99)
Potassium: 3 mmol/L — ABNORMAL LOW (ref 3.5–5.1)
Sodium: 140 mmol/L (ref 135–145)

## 2020-02-08 LAB — CBC
HCT: 51.4 % — ABNORMAL HIGH (ref 36.0–46.0)
Hemoglobin: 16.3 g/dL — ABNORMAL HIGH (ref 12.0–15.0)
MCH: 27.8 pg (ref 26.0–34.0)
MCHC: 31.7 g/dL (ref 30.0–36.0)
MCV: 87.6 fL (ref 80.0–100.0)
Platelets: 174 10*3/uL (ref 150–400)
RBC: 5.87 MIL/uL — ABNORMAL HIGH (ref 3.87–5.11)
RDW: 13.5 % (ref 11.5–15.5)
WBC: 5.7 10*3/uL (ref 4.0–10.5)
nRBC: 0 % (ref 0.0–0.2)

## 2020-02-08 LAB — I-STAT BETA HCG BLOOD, ED (MC, WL, AP ONLY): I-stat hCG, quantitative: 5.2 m[IU]/mL — ABNORMAL HIGH (ref <5)

## 2020-02-08 LAB — D-DIMER, QUANTITATIVE: D-Dimer, Quant: 0.71 ug/mL-FEU — ABNORMAL HIGH (ref 0.00–0.50)

## 2020-02-08 LAB — SARS CORONAVIRUS 2 (TAT 6-24 HRS): SARS Coronavirus 2: POSITIVE — AB

## 2020-02-08 MED ORDER — POTASSIUM CHLORIDE CRYS ER 20 MEQ PO TBCR
40.0000 meq | EXTENDED_RELEASE_TABLET | Freq: Once | ORAL | Status: AC
  Administered 2020-02-08: 40 meq via ORAL
  Filled 2020-02-08: qty 2

## 2020-02-08 MED ORDER — POTASSIUM CHLORIDE ER 10 MEQ PO TBCR: 20.0000 meq | EXTENDED_RELEASE_TABLET | Freq: Every day | ORAL | 0 refills | Status: DC

## 2020-02-08 MED ORDER — IOHEXOL 350 MG/ML SOLN
100.0000 mL | Freq: Once | INTRAVENOUS | Status: AC | PRN
  Administered 2020-02-08: 100 mL via INTRAVENOUS

## 2020-02-08 MED ORDER — DOXYCYCLINE HYCLATE 100 MG PO CAPS: 100.0000 mg | ORAL_CAPSULE | Freq: Two times a day (BID) | ORAL | 0 refills | Status: DC

## 2020-02-08 MED ORDER — ACETAMINOPHEN 500 MG PO TABS
1000.0000 mg | ORAL_TABLET | Freq: Once | ORAL | Status: AC
  Administered 2020-02-08: 1000 mg via ORAL
  Filled 2020-02-08: qty 2

## 2020-02-08 MED ORDER — POTASSIUM CHLORIDE 10 MEQ/100ML IV SOLN
10.0000 meq | Freq: Once | INTRAVENOUS | Status: AC
  Administered 2020-02-08: 10 meq via INTRAVENOUS
  Filled 2020-02-08: qty 100

## 2020-02-08 MED ORDER — DOXYCYCLINE HYCLATE 100 MG PO TABS
100.0000 mg | ORAL_TABLET | Freq: Once | ORAL | Status: AC
  Administered 2020-02-08: 100 mg via ORAL
  Filled 2020-02-08: qty 1

## 2020-02-08 MED ORDER — ONDANSETRON HCL 4 MG/2ML IJ SOLN
4.0000 mg | Freq: Once | INTRAMUSCULAR | Status: AC
  Administered 2020-02-08: 4 mg via INTRAVENOUS
  Filled 2020-02-08: qty 2

## 2020-02-08 NOTE — Discharge Instructions (Signed)
All the tests on your heart today were ok.  The pacemaker is working well and the blood test to your heart were normal.  The CAT scan showed no signs of artery problems but did show some pneumonia.  The COVID test will be back by tomorrow but we have sent an antibiotic to your pharmacy that you should get tomorrow as well as some potassium.

## 2020-02-08 NOTE — ED Provider Notes (Signed)
[facility] EMERGENCY DEPARTMENT Provider Note   CSN: [id number] Arrival date & time: 02/08/20  1026     History Chief Complaint  Patient presents with  . Chest Pain    Lindsay Camacho is a 55 y.o. female.  Patient is a 55 year old female with a history of MI, CAD status post CABG, nonischemic cardiomyopathy status post pacemaker/defibrillator, left bundle branch block, stroke and prior heart failure who continues to abuse tobacco and has been off of her medication medication for the last 2 months due to insurance changes and waiting to see her doctor who is presenting today with complaint of chest pain and feelings of numbness in her face and arm when she stands up and tries to walk.  She reports shortness of breath that is chronic but does not feel like it is any worse today.  The pain is a sharp sensation in the left side of her chest that feels like someone's throwing golf balls at her.  It does not radiate but is sore when you push on it.  She denies any recent cough, vomiting, fever or infectious symptoms.  She reports yesterday was a normal day and she felt fine when she went to bed last night.  Around 730 this morning at rest is when her symptoms started.  It seems to be much worse if she tries to stand and walk around and better with lying down in the bed.  Currently while lying down she reports no numbness of her face or arms but she still has the dull pain in her chest.  She also reports she feels lightheaded like she might pass out when she stands up.  She denies any alcohol or drug use.  The history is provided by the patient.  Chest Pain Pain location:  L chest      Past Medical History:  Diagnosis Date  . Acute myocardial infarction of other lateral wall, initial episode of care 01/2013   DES CFX  . Aortoiliac occlusive disease (HCC) 08/22/2016  . CAD (coronary artery disease) 11/13/2011   50% ? proximal LCx stenosis per Cath at [facility]  . Chest pain,  atypical, muscular skelatal   01/09/2014  . Dyspnea   . Heart failure (HCC)   . HLD (hyperlipidemia)   . Hypertension   . Intermediate coronary syndrome (HCC)   . LBBB (left bundle branch block)   . Mitral regurgitation   . Nonischemic cardiomyopathy (HCC) 01/26/2017  . PVD (peripheral vascular disease) (HCC)    Right CFA stenosis per report on 11/14/2011  . 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/25/2016   Edwards McArthy-Adams IMR ETLogix ring annuloplasty (Model 4100, Serial [id number], size 26) placed via right mini thoracotomy approach  . Stenosis of left subclavian artery (HCC) 08/22/2016  . Stroke [facility])    tia with no deficits    Patient Active Problem List   Diagnosis Date Noted  . Chest pain, rule out acute myocardial infarction 07/22/2018  . 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 Nonischemic cardiomyopathy (HCC) 01/26/2017  . 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 Stenosis of left subclavian artery (HCC) 08/22/2016  . Aortoiliac occlusive disease (HCC) 08/22/2016  . Acute on chronic systolic CHF (congestive heart failure) (HCC)   . Mitral regurgitation 08/17/2016  . Heart failure (HCC) 08/17/2016  . Shortness of breath   . 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  . Diarrhea 10/05/2014  . Syncope 03/09/2014  . Chronic combined systolic (congestive) and  diastolic (congestive) heart failure (HCC) 01/12/2014  . Chest pain, atypical, muscular skelatal   01/09/2014  . Diastolic CHF, acute (HCC) 01/08/2014  . Chest pain at rest 01/08/2014  . Congestive heart disease (HCC)   . Post-op pain-Right groin area 05/27/2013  . Drainage from wound-Groin area 05/27/2013  . Retroperitoneal hematoma 05/01/2013  . Retroperitoneal bleed 04/15/2013  . Anemia due to blood loss 04/15/2013  . Hyperlipidemia with target LDL less than 70 04/15/2013  . Intermediate coronary syndrome (HCC)   . Chest pain 04/11/2013  . CAD - CFX DES 01/17/13  01/20/2013  . Rash post PCI 01/20/2013  . NSTEMI (non-ST elevated myocardial infarction) (HCC) 01/17/2013  . Chest pain, musculoskeletal 10/07/2012  . HTN (hypertension) 10/07/2012  . LBBB (left bundle branch block) 10/07/2012  . Tobacco abuse 10/07/2012    Past Surgical History:  Procedure Laterality Date  . APPENDECTOMY    . BIV ICD INSERTION CRT-D N/A 01/24/2017   Procedure: BIV ICD INSERTION CRT-D;  Surgeon: Marinus Maw, MD;  Location: [facility] INVASIVE CV LAB;  Service: Cardiovascular;  Laterality: N/A;  . CARDIAC CATHETERIZATION  04/14/2013   Non-obstructive disease, patent CFX stent  . CARDIAC CATHETERIZATION N/A 09/21/2014   Procedure: Left Heart Cath and Coronary Angiography;  Surgeon: Lennette Bihari, MD;  Location: [facility] INVASIVE CV LAB;  Service: Cardiovascular;  Laterality: N/A;  . CORONARY ANGIOPLASTY WITH STENT PLACEMENT  01/17/2013   mild disease except 99% CFX, rx with  2.5 x 28 Alpine drug-eluting stent   . GROIN DEBRIDEMENT Right 04/14/2013   Procedure: Emergency Evacuation of Retroperitoneal Hematoma and Repair Right External Iliac Artery Pseudoaneurysm    ;  Surgeon: Sherren Kerns, MD;  Location: [facility] OR;  Service: Vascular;  Laterality: Right;  . LEFT HEART CATHETERIZATION WITH CORONARY ANGIOGRAM N/A 01/18/2013   Procedure: LEFT HEART CATHETERIZATION WITH CORONARY ANGIOGRAM;  Surgeon: Corky Crafts, MD;  Location: [facility] CATH LAB;  Service: Cardiovascular;  Laterality: N/A;  . LEFT HEART CATHETERIZATION WITH CORONARY ANGIOGRAM N/A 04/14/2013   Procedure: LEFT HEART CATHETERIZATION WITH CORONARY ANGIOGRAM;  Surgeon: Corky Crafts, MD;  Location: [facility] CATH LAB;  Service: Cardiovascular;  Laterality: N/A;  . MITRAL VALVE REPAIR Right 08/25/2016   Procedure: MINIMALLY INVASIVE MITRAL VALVE REPAIR;  Surgeon: Purcell Nails, MD;  Location: [facility] OR;  Service: Open Heart Surgery;  Laterality: Right;  . MULTIPLE EXTRACTIONS WITH ALVEOLOPLASTY N/A 08/23/2016   Procedure: Extraction  of tooth #'s 17, 20-23, 26-29 with alveoloplasty;  Surgeon: [name], DDS;  Location: [facility] OR;  Service: Oral Surgery;  Laterality: N/A;  . PERCUTANEOUS CORONARY STENT INTERVENTION (PCI-S)  01/18/2013   Procedure: PERCUTANEOUS CORONARY STENT INTERVENTION (PCI-S);  Surgeon: Corky Crafts, MD;  Location: [facility] CATH LAB;  Service: Cardiovascular;;  . RIGHT/LEFT HEART CATH AND CORONARY ANGIOGRAPHY N/A 08/21/2016   Procedure: RIGHT/LEFT HEART CATH AND CORONARY ANGIOGRAPHY;  Surgeon: Kathleene Hazel, MD;  Location: [facility] INVASIVE CV LAB;  Service: Cardiovascular;  Laterality: N/A;  . TEE WITHOUT CARDIOVERSION N/A 03/06/2014   Procedure: TRANSESOPHAGEAL ECHOCARDIOGRAM (TEE);  Surgeon: Lars Masson, MD;  Location: [facility] ENDOSCOPY;  Service: Cardiovascular;  Laterality: N/A;  . TEE WITHOUT CARDIOVERSION N/A 08/18/2016   Procedure: TRANSESOPHAGEAL ECHOCARDIOGRAM (TEE);  Surgeon: Lewayne Bunting, MD;  Location: [facility] ENDOSCOPY;  Service: Cardiovascular;  Laterality: N/A;  . TEE WITHOUT CARDIOVERSION N/A 08/25/2016   Procedure: TRANSESOPHAGEAL ECHOCARDIOGRAM (TEE);  Surgeon: Purcell Nails, MD;  Location: [facility] OR;  Service: Open Heart Surgery;  Laterality: N/A;  .  TUBAL LIGATION       OB History   No obstetric history on file.     Family History  Problem Relation Age of Onset  . CAD Mother 25  . Lung cancer Mother   . Bladder Cancer Mother   . Stroke Mother   . Heart disease Mother        Before age 44  . Hypertension Mother   . Heart attack Mother   . CAD Father 63  . Heart disease Father        Before age 16  . Heart attack Father   . Stroke Father        Bleeding stroke    Social History   Tobacco Use  . Smoking status: Former Smoker    Packs/day: 0.50    Types: Cigarettes    Quit date: 08/19/2015    Years since quitting: 4.4  . Smokeless tobacco: Never Used  . Tobacco comment: i QUIT SMOKING A YEAR AGO, i DONT REMEMBER WHAT MONTH  Vaping Use  . Vaping Use: Never used   Substance Use Topics  . Alcohol use: No    Alcohol/week: 0.0 standard drinks  . Drug use: Yes    Types: Marijuana    Home Medications Prior to Admission medications   Medication Sig Start Date End Date Taking? Authorizing Provider  acetaminophen (TYLENOL) 500 MG tablet Take 1,000 mg by mouth every 6 (six) hours as needed for headache (pain).   Yes [provider]  aspirin EC 81 MG tablet Take 81 mg by mouth daily.   Yes [provider]  atorvastatin (LIPITOR) 80 MG tablet Take 1 tablet (80 mg total) by mouth daily. Please make overdue appt with Dr. Eldridge Dace before anymore refills. 3rd and Final attempt 10/14/19  Yes Corky Crafts, MD  Bempedoic Acid (NEXLETOL) 180 MG TABS Take 180 mg by mouth daily. 01/22/19   Corky Crafts, MD  carvedilol (COREG) 6.25 MG tablet Take 1 tablet (6.25 mg total) by mouth 2 (two) times daily with a meal. 10/14/19   Corky Crafts, MD  clopidogrel (PLAVIX) 75 MG tablet Take 1 tablet (75 mg total) by mouth daily. Please make overdue appt with Dr. Eldridge Dace before anymore refills. 2nd attempt 08/07/19   Corky Crafts, MD  ezetimibe (ZETIA) 10 MG tablet Take 1 tablet (10 mg total) by mouth daily. 03/29/17   Robbie Lis M, PA-C  furosemide (LASIX) 40 MG tablet Take 40 mg by mouth daily as needed for fluid or edema (shortness of breath).     [provider]  losartan (COZAAR) 25 MG tablet TAKE 1/2 TABLET BY MOUTH DAILY 08/14/19   Corky Crafts, MD  nicotine (NICODERM CQ) 7 mg/24hr patch Place 1 patch (7 mg total) onto the skin daily. 10/14/19   Marinus Maw, MD    Allergies    Aldomet [methyldopa], Brilinta [ticagrelor], Other, Coumadin [warfarin sodium], Eliquis [apixaban], and Penicillins  Review of Systems   Review of Systems  Cardiovascular: Positive for chest pain.  All other systems reviewed and are negative.   Physical Exam Updated Vital Signs BP (!) 154/90 (BP Location: Right Arm)    Pulse 74   Temp 98.2 F (36.8 C) (Oral)   Resp (!) 21   SpO2 99%   Physical Exam Vitals and nursing note reviewed.  Constitutional:      General: She is not in acute distress.    Appearance: She is well-developed, normal weight and well-nourished.  HENT:  Head: Normocephalic and atraumatic.     Nose: Nose normal.     Mouth/Throat:     Mouth: Mucous membranes are moist.  Eyes:     Extraocular Movements: EOM normal.     Pupils: Pupils are equal, round, and reactive to light.  Cardiovascular:     Rate and Rhythm: Normal rate and regular rhythm.     Pulses: Normal pulses and intact distal pulses.     Heart sounds: Murmur heard.   Systolic murmur is present with a grade of 2/6. No friction rub.     Comments: Pulses equal in upper and lower ext Pulmonary:     Effort: Pulmonary effort is normal.     Breath sounds: Normal breath sounds. No wheezing or rales.  Abdominal:     General: Bowel sounds are normal. There is no distension.     Palpations: Abdomen is soft.     Tenderness: There is no abdominal tenderness. There is no guarding or rebound.  Musculoskeletal:        General: No tenderness. Normal range of motion.     Cervical back: Normal range of motion and neck supple.     Right lower leg: No edema.     Left lower leg: No edema.     Comments: No edema  Skin:    General: Skin is warm and dry.     Findings: No rash.  Neurological:     General: No focal deficit present.     Mental Status: She is alert and oriented to person, place, and time. Mental status is at baseline.     Cranial Nerves: No cranial nerve deficit.     Sensory: No sensory deficit.     Motor: No weakness.     Comments: Normal strength and sensation in arms and legs  Psychiatric:        Mood and Affect: Mood and affect and mood normal.        Behavior: Behavior normal.        Thought Content: Thought content normal.     ED Results / Procedures / Treatments   Labs (all labs ordered are listed, but  only abnormal results are displayed) Labs Reviewed  BASIC METABOLIC PANEL - Abnormal; Notable for the following components:      Result Value   Potassium 3.0 (*)    BUN 5 (*)    All other components within normal limits  CBC - Abnormal; Notable for the following components:   RBC 5.87 (*)    Hemoglobin 16.3 (*)    HCT 51.4 (*)    All other components within normal limits  D-DIMER, QUANTITATIVE (NOT AT [facility]) - Abnormal; Notable for the following components:   D-Dimer, Quant 0.71 (*)    All other components within normal limits  I-STAT BETA HCG BLOOD, ED ([facility], [facility], [facility] ONLY) - Abnormal; Notable for the following components:   I-stat hCG, quantitative 5.2 (*)    All other components within normal limits  TROPONIN I (HIGH SENSITIVITY)  TROPONIN I (HIGH SENSITIVITY)    EKG EKG Interpretation  Date/Time:  Sunday February 08 2020 10:36:48 EST Ventricular Rate:  83 PR Interval:  126 QRS Duration: 138 QT Interval:  446 QTC Calculation: 524 R Axis:   -37 Text Interpretation: Atrial-sensed ventricular-paced rhythm Biventricular pacemaker detected Confirmed by Gwyneth Sprout ([id number]) on 02/08/2020 11:52:05 AM   Radiology DG Chest 2 View  Result Date: 02/08/2020 CLINICAL DATA:  55 year old female with chest pain and shortness of breath.  EXAM: CHEST - 2 VIEW COMPARISON:  Chest radiograph 07/22/2018 and earlier. FINDINGS: Calcified aortic atherosclerosis. Chronic cardiac valve replacement. Stable left chest AICD. Mediastinal contours remain within normal limits. Visualized tracheal air column is within normal limits. No pneumothorax, pulmonary edema, pleural effusion or confluent pulmonary opacity. Stable right axillary surgical clips. No acute osseous abnormality identified. Negative visible bowel gas pattern. Probable external artifact projecting over the lumbar spine on the lateral view. IMPRESSION: 1. No acute cardiopulmonary abnormality. 2.  Aortic Atherosclerosis (ICD10-I70.0).  Electronically Signed   By: Odessa FlemingH  Hall M.D.   On: 02/08/2020 11:19    Procedures Procedures   Medications Ordered in ED Medications  potassium chloride 10 mEq in 100 mL IVPB (has no administration in time range)    ED Course  I have reviewed the triage vital signs and the nursing notes.  Pertinent labs & imaging results that were available during my care of the patient were reviewed by me and considered in my medical decision making (see chart for details).    MDM Rules/Calculators/A&P                          Patient is a 55 year old female presenting today with atypical story with chest pain and feeling numbness in her face and upper arms only when she stands and attempts to walk.  Patient is well-appearing on exam.  No notable neurologic deficits this patient is able to move her upper and lower extremities without difficulty has equal pulses in all 4 extremities and no evidence at this time of CHF exacerbation.  Patient denied any palpitations today and reports her defibrillator has not fired.  However the chest pain did just start today and seems to be worse with standing or moving.  She does have some reproducible pain with palpation but no evidence of zoster or skin manifestations.  She has no abdominal pain or GI symptoms.  Patient has an extensive cardiac history and concern for atypical ACS versus dysrhythmia versus dissection.  Low risk Wells criteria.  Labs are significant for hypokalemia of 3.0 but normal renal function.  CBC without acute findings.  Initial troponin 15 and EKG shows a paced rhythm.  2:21 PM Patient's delta troponin is pending.  Potassium is low at 3.0 and she will be replaced.  D-dimer is elevated at 0.71 and will do a CTA to rule out dissection.  4:49 PM Patient did not tolerate IV potassium due to severe burning and was changed to oral therapy.  Troponin is flat with repeat of 13 from 15.  CTA to rule out dissection was negative for PE or dissection.  She does  have calcified and noncalcified plaques in the abdominal aorta without significant stenosis as well the bilateral common iliac arteries without significant stenosis.  She does have mild streaky airspace consolidation in the periphery of the right upper lobe and lingula may be related atelectasis or early developing pneumonia.  Upon reevaluation patient reports since getting the IV dye she has had nausea and starts to vomit on the side of the bed.  She has no evidence of rash, tachycardia or shortness of breath.  She was given Zofran we will continue to monitor.  Patient has not received the COVID vaccine and has not had any known positive contacts.  She does report that she has noticed an intermittent headache over the last week.  Will give patient Tylenol for the headache and swabs for Covid.  Pacemaker interrogated without  any acute events since last evaluation.  5:43 PM Nausea resolved after zofran and pt now feeling better.  No specific reason for admission at this time.  Will treat with abx for pna and COVID swab pending.  Encourage pt to keep follow up with Dr. Ladona Ridgel which is soon on thursday and follow up with PCP for recheck in 1 week.  MDM Number of Diagnoses or Management Options   Amount and/or Complexity of Data Reviewed Clinical lab tests: ordered and reviewed Tests in the radiology section of CPT: ordered and reviewed Tests in the medicine section of CPT: ordered and reviewed Decide to obtain previous medical records or to obtain history from someone other than the patient: yes Obtain history from someone other than the patient: yes Review and summarize past medical records: yes Independent visualization of images, tracings, or specimens: yes  Risk of Complications, Morbidity, and/or Mortality Presenting problems: high Diagnostic procedures: low Management options: low  Patient Progress Patient progress: stable   Final Clinical Impression(s) / ED Diagnoses Final diagnoses:   Community acquired pneumonia of right lung, unspecified part of lung  Hypokalemia    Rx / DC Orders ED Discharge Orders         Ordered    doxycycline (VIBRAMYCIN) 100 MG capsule  2 times daily        02/08/20 1811    potassium chloride (KLOR-CON) 10 MEQ tablet  Daily        02/08/20 1811           Gwyneth Sprout, MD 02/08/20 1814

## 2020-02-08 NOTE — ED Triage Notes (Signed)
C/o pain under L breast, SOB, and facial numbness (bilateral) x 2-3 days.  States numbness radiates to bilateral arms when she stands.

## 2020-02-09 ENCOUNTER — Other Ambulatory Visit: Payer: Self-pay | Admitting: Interventional Cardiology

## 2020-02-09 ENCOUNTER — Other Ambulatory Visit: Payer: Self-pay | Admitting: Physician Assistant

## 2020-02-09 ENCOUNTER — Telehealth: Payer: Self-pay | Admitting: Nurse Practitioner

## 2020-02-09 ENCOUNTER — Telehealth: Payer: Self-pay | Admitting: Physician Assistant

## 2020-02-09 DIAGNOSIS — Z95810 Presence of automatic (implantable) cardiac defibrillator: Secondary | ICD-10-CM

## 2020-02-09 DIAGNOSIS — Z9889 Other specified postprocedural states: Secondary | ICD-10-CM

## 2020-02-09 DIAGNOSIS — I5023 Acute on chronic systolic (congestive) heart failure: Secondary | ICD-10-CM

## 2020-02-09 DIAGNOSIS — Z720 Tobacco use: Secondary | ICD-10-CM

## 2020-02-09 DIAGNOSIS — U071 COVID-19: Secondary | ICD-10-CM

## 2020-02-09 DIAGNOSIS — I25118 Atherosclerotic heart disease of native coronary artery with other forms of angina pectoris: Secondary | ICD-10-CM

## 2020-02-09 DIAGNOSIS — I428 Other cardiomyopathies: Secondary | ICD-10-CM

## 2020-02-09 DIAGNOSIS — I10 Essential (primary) hypertension: Secondary | ICD-10-CM

## 2020-02-09 DIAGNOSIS — I5031 Acute diastolic (congestive) heart failure: Secondary | ICD-10-CM

## 2020-02-09 NOTE — Progress Notes (Signed)
I connected by phone with Lindsay Camacho on 02/09/2020 at 11:25 AM to discuss the potential use of a new treatment for mild to moderate COVID-19 viral infection in non-hospitalized patients.  This patient is a 55 y.o. female that meets the FDA criteria for Emergency Use Authorization of COVID monoclonal antibody sotrovimab.  Has a (+) direct SARS-CoV-2 viral test result  Has mild or moderate COVID-19   Is NOT hospitalized due to COVID-19  Is within 10 days of symptom onset  Has at least one of the high risk factor(s) for progression to severe COVID-19 and/or hospitalization as defined in EUA.  Specific high risk criteria : Cardiovascular disease or hypertension, Chronic Lung Disease and Other high risk medical condition per CDC:  unvaccinated, high SVI   I have spoken and communicated the following to the patient or parent/caregiver regarding COVID monoclonal antibody treatment:  1. FDA has authorized the emergency use for the treatment of mild to moderate COVID-19 in adults and pediatric patients with positive results of direct SARS-CoV-2 viral testing who are 41 years of age and older weighing at least 40 kg, and who are at high risk for progressing to severe COVID-19 and/or hospitalization.  2. The significant known and potential risks and benefits of COVID monoclonal antibody, and the extent to which such potential risks and benefits are unknown.  3. Information on available alternative treatments and the risks and benefits of those alternatives, including clinical trials.  4. Patients treated with COVID monoclonal antibody should continue to self-isolate and use infection control measures (e.g., wear mask, isolate, social distance, avoid sharing personal items, clean and disinfect "high touch" surfaces, and frequent handwashing) according to CDC guidelines.   5. The patient or parent/caregiver has the option to accept or refuse COVID monoclonal antibody treatment.  After reviewing this  information with the patient, the patient has agreed to receive one of the available covid 19 monoclonal antibodies and will be provided an appropriate fact sheet prior to infusion.  Sx onset 2/5. Set up for infusion on 2/8 @ 10:30am. Directions given to [facility]. Pt is aware that insurance will be charged an infusion fee. Pt is unvaccinated.   Cline Crock 02/09/2020 11:25 AM

## 2020-02-09 NOTE — Telephone Encounter (Signed)
Called to Discuss with patient about Covid symptoms and the use of the monoclonal antibody infusion for those with mild to moderate Covid symptoms and at a high risk of hospitalization.     Pt appears to qualify for this infusion due to co-morbid conditions and/or a member of an at-risk group in accordance with the FDA Emergency Use Authorization.    Unable to reach pt. Voicemail left and My Chart message sent.   Symptom onset: 02/07/2020 Vaccinated: Unsure Booster: Unsure not on file Immunocompromised: NO  Qualifiers: BMI, htn, CAD, dm  Willette Alma, NP [facility]  [phone]

## 2020-02-09 NOTE — Telephone Encounter (Signed)
Patient was seen in ED on 02/08/20

## 2020-02-09 NOTE — Telephone Encounter (Signed)
Called to discuss with [name] about Covid symptoms and the use of sotrovimab, remdisivir or oral therapies for those with mild to moderate Covid symptoms and at a high risk of hospitalization.    Pt is qualified due to co-morbid conditions and/or a member of an at-risk group (many RFs, CVD, chronic lung dz, unvaccinated, early lung changes on chest CT ), however would like to think more about the infusion/oral therapies at this time. Symptoms tier reviewed as well as criteria for ending isolation.  Symptoms reviewed that would warrant ED/Hospital evaluation. Preventative practices reviewed. Patient verbalized understanding. Patient advised to call back if he decides that he does want to get infusion. Callback number to the infusion center given. Patient advised to go to Urgent care or ED with severe symptoms. Last date pt would be eligible for infusion is 2/11.  Last date pt would be eligible for orals is 2/9.  Pt didn't know she had covid. She is very irritated by this. She started having chest pain and facial numbness on 2/5 but no other viral sx. Hard to tell but will call that the symptom onset date. Seems to be high risk based on medical history but wants to think about this at the times.   Patient Active Problem List   Diagnosis Date Noted  . Chest pain, rule out acute myocardial infarction 07/22/2018  . 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 Nonischemic cardiomyopathy (HCC) 01/26/2017  . 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 Stenosis of left subclavian artery (HCC) 08/22/2016  . Aortoiliac occlusive disease (HCC) 08/22/2016  . Acute on chronic systolic CHF (congestive heart failure) (HCC)   . Mitral regurgitation 08/17/2016  . Heart failure (HCC) 08/17/2016  . Shortness of breath   . 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  . Diarrhea 10/05/2014  . Syncope 03/09/2014  . Chronic combined systolic (congestive) and diastolic  (congestive) heart failure (HCC) 01/12/2014  . Chest pain, atypical, muscular skelatal   01/09/2014  . Diastolic CHF, acute (HCC) 01/08/2014  . Chest pain at rest 01/08/2014  . Congestive heart disease (HCC)   . Post-op pain-Right groin area 05/27/2013  . Drainage from wound-Groin area 05/27/2013  . Retroperitoneal hematoma 05/01/2013  . Retroperitoneal bleed 04/15/2013  . Anemia due to blood loss 04/15/2013  . Hyperlipidemia with target LDL less than 70 04/15/2013  . Intermediate coronary syndrome (HCC)   . Chest pain 04/11/2013  . CAD - CFX DES 01/17/13 01/20/2013  . Rash post PCI 01/20/2013  . NSTEMI (non-ST elevated myocardial infarction) (HCC) 01/17/2013  . Chest pain, musculoskeletal 10/07/2012  . HTN (hypertension) 10/07/2012  . LBBB (left bundle branch block) 10/07/2012  . Tobacco abuse 10/07/2012    Cline Crock PA-C

## 2020-02-09 NOTE — Progress Notes (Deleted)
Cardiology Office Note Date:  02/09/2020  Patient ID:  Lindsay, Camacho [birth year], MRN [id number] PCP:  Patient, No Pcp Per  Cardiologist:  Dr. Eldridge Dace Electrophysiologist: Dr. Ladona Ridgel  ***refresh   Chief Complaint: *** annual EP visit  History of Present Illness: Lindsay Camacho is a 55 y.o. female with history of CAD (s/plateral wall MI in 2015treated with PCI to the CXF. She had a repeat cath in 09/2014 showing patent stent), chronic CHF (systolic),  VHD (s/p minimally invasive mitral valve repair 2018), HTN, HLD, LBBB, NICM, chronic CHF (systolic), ICD, stroke.  She comes in today to be seen for Dr. Ladona Ridgel, last seen by him March 2021, described her as class II symptoms,, BP was high though reported better at home, device functioning normally, no changes were made.  More recently she saw Dr. Eldridge Dace oct 2021, discussed that her BP has limited HF management/medicines, having some symptoms with her praluent injections.  Started smoking again (unable to afford patches).  Swelling knee improving with heating pad. No medical changes were made, was going to look into seeing if nicotine patches would be covered by her insurance.   She was seen in the ER 02/08/20 with c/o CP, numbness in her face arms upon standing with some dizziness.  CP described as sharp like she was getting golf balls thrown at her chest with + tenderness. K+ was low and replaced. Ddimer was elevated 0.71, CT chest negative for PE or dissection HS Trop 15 > 13 Developed nausea/vomiting and treated with zofran CXR perhaps c/w early pneumonia, given abx Feeling better and discharged from the ER  Subsequently her COVID test was +   *** volume *** meds, CAD, CM *** symptoms,  *** labs   Device information MDT CRT ICD implanted 01/24/2017 Unable to place CS lead, she had a HIS/RV system   Past Medical History:  Diagnosis Date  . Acute myocardial infarction of other lateral wall, initial episode of care  01/2013   DES CFX  . Aortoiliac occlusive disease (HCC) 08/22/2016  . CAD (coronary artery disease) 11/13/2011   50% ? proximal LCx stenosis per Cath at [facility]  . Chest pain, atypical, muscular skelatal   01/09/2014  . Dyspnea   . Heart failure (HCC)   . HLD (hyperlipidemia)   . Hypertension   . Intermediate coronary syndrome (HCC)   . LBBB (left bundle branch block)   . Mitral regurgitation   . Nonischemic cardiomyopathy (HCC) 01/26/2017  . PVD (peripheral vascular disease) (HCC)    Right CFA stenosis per report on 11/14/2011  . 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/25/2016   Edwards McArthy-Adams IMR ETLogix ring annuloplasty (Model 4100, Serial [id number], size 26) placed via right mini thoracotomy approach  . Stenosis of left subclavian artery (HCC) 08/22/2016  . Stroke [facility])    tia with no deficits    Past Surgical History:  Procedure Laterality Date  . APPENDECTOMY    . BIV ICD INSERTION CRT-D N/A 01/24/2017   Procedure: BIV ICD INSERTION CRT-D;  Surgeon: Marinus Maw, MD;  Location: [facility] INVASIVE CV LAB;  Service: Cardiovascular;  Laterality: N/A;  . CARDIAC CATHETERIZATION  04/14/2013   Non-obstructive disease, patent CFX stent  . CARDIAC CATHETERIZATION N/A 09/21/2014   Procedure: Left Heart Cath and Coronary Angiography;  Surgeon: Lennette Bihari, MD;  Location: [facility] INVASIVE CV LAB;  Service: Cardiovascular;  Laterality: N/A;  . CORONARY ANGIOPLASTY WITH STENT PLACEMENT  01/17/2013   mild disease except 99% CFX,  rx with  2.5 x 28 Alpine drug-eluting stent   . GROIN DEBRIDEMENT Right 04/14/2013   Procedure: Emergency Evacuation of Retroperitoneal Hematoma and Repair Right External Iliac Artery Pseudoaneurysm    ;  Surgeon: Sherren Kerns, MD;  Location: [facility] OR;  Service: Vascular;  Laterality: Right;  . LEFT HEART CATHETERIZATION WITH CORONARY ANGIOGRAM N/A 01/18/2013   Procedure: LEFT HEART CATHETERIZATION WITH CORONARY ANGIOGRAM;  Surgeon: Corky Crafts, MD;  Location:  [facility] CATH LAB;  Service: Cardiovascular;  Laterality: N/A;  . LEFT HEART CATHETERIZATION WITH CORONARY ANGIOGRAM N/A 04/14/2013   Procedure: LEFT HEART CATHETERIZATION WITH CORONARY ANGIOGRAM;  Surgeon: Corky Crafts, MD;  Location: [facility] CATH LAB;  Service: Cardiovascular;  Laterality: N/A;  . MITRAL VALVE REPAIR Right 08/25/2016   Procedure: MINIMALLY INVASIVE MITRAL VALVE REPAIR;  Surgeon: Purcell Nails, MD;  Location: [facility] OR;  Service: Open Heart Surgery;  Laterality: Right;  . MULTIPLE EXTRACTIONS WITH ALVEOLOPLASTY N/A 08/23/2016   Procedure: Extraction of tooth #'s 17, 20-23, 26-29 with alveoloplasty;  Surgeon: [name], DDS;  Location: [facility] OR;  Service: Oral Surgery;  Laterality: N/A;  . PERCUTANEOUS CORONARY STENT INTERVENTION (PCI-S)  01/18/2013   Procedure: PERCUTANEOUS CORONARY STENT INTERVENTION (PCI-S);  Surgeon: Corky Crafts, MD;  Location: [facility] CATH LAB;  Service: Cardiovascular;;  . RIGHT/LEFT HEART CATH AND CORONARY ANGIOGRAPHY N/A 08/21/2016   Procedure: RIGHT/LEFT HEART CATH AND CORONARY ANGIOGRAPHY;  Surgeon: Kathleene Hazel, MD;  Location: [facility] INVASIVE CV LAB;  Service: Cardiovascular;  Laterality: N/A;  . TEE WITHOUT CARDIOVERSION N/A 03/06/2014   Procedure: TRANSESOPHAGEAL ECHOCARDIOGRAM (TEE);  Surgeon: Lars Masson, MD;  Location: [facility] ENDOSCOPY;  Service: Cardiovascular;  Laterality: N/A;  . TEE WITHOUT CARDIOVERSION N/A 08/18/2016   Procedure: TRANSESOPHAGEAL ECHOCARDIOGRAM (TEE);  Surgeon: Lewayne Bunting, MD;  Location: [facility] ENDOSCOPY;  Service: Cardiovascular;  Laterality: N/A;  . TEE WITHOUT CARDIOVERSION N/A 08/25/2016   Procedure: TRANSESOPHAGEAL ECHOCARDIOGRAM (TEE);  Surgeon: Purcell Nails, MD;  Location: [facility] OR;  Service: Open Heart Surgery;  Laterality: N/A;  . TUBAL LIGATION      Current Outpatient Medications  Medication Sig Dispense Refill  . acetaminophen (TYLENOL) 500 MG tablet Take 1,000 mg by mouth every 6 (six) hours as needed  for headache (pain).    Marland Kitchen aspirin EC 81 MG tablet Take 81 mg by mouth daily.    Marland Kitchen atorvastatin (LIPITOR) 80 MG tablet Take 1 tablet (80 mg total) by mouth daily. Please make overdue appt with Dr. Eldridge Dace before anymore refills. 3rd and Final attempt 15 tablet 0  . Bempedoic Acid (NEXLETOL) 180 MG TABS Take 180 mg by mouth daily. 30 tablet 3  . carvedilol (COREG) 6.25 MG tablet Take 1 tablet (6.25 mg total) by mouth 2 (two) times daily with a meal. (Patient taking differently: Take 6.25 mg by mouth at bedtime.) 60 tablet 0  . clopidogrel (PLAVIX) 75 MG tablet Take 1 tablet (75 mg total) by mouth daily. Please make overdue appt with Dr. Eldridge Dace before anymore refills. 2nd attempt (Patient taking differently: Take 75 mg by mouth at bedtime. Please make overdue appt with Dr. Eldridge Dace before anymore refills. 2nd attempt) 15 tablet 0  . doxycycline (VIBRAMYCIN) 100 MG capsule Take 1 capsule (100 mg total) by mouth 2 (two) times daily. 10 capsule 0  . ezetimibe (ZETIA) 10 MG tablet Take 1 tablet (10 mg total) by mouth daily. (Patient not taking: Reported on 02/08/2020) 90 tablet 3  . furosemide (LASIX) 40 MG tablet Take  40 mg by mouth daily as needed for fluid or edema (shortness of breath).     . losartan (COZAAR) 25 MG tablet TAKE 1/2 TABLET BY MOUTH DAILY (Patient taking differently: Take 12.5 mg by mouth at bedtime.) 45 tablet 1  . nicotine (NICODERM CQ) 7 mg/24hr patch Place 1 patch (7 mg total) onto the skin daily. 28 patch 0  . potassium chloride (KLOR-CON) 10 MEQ tablet Take 2 tablets (20 mEq total) by mouth daily. 20 tablet 0   No current facility-administered medications for this visit.    Allergies:   Aldomet [methyldopa], Brilinta [ticagrelor], Other, Coumadin [warfarin sodium], Eliquis [apixaban], and Penicillins   Social History:  The patient  reports that she quit smoking about 4 years ago. Her smoking use included cigarettes. She smoked 0.50 packs per day. She has never used smokeless  tobacco. She reports current drug use. Drug: Marijuana. She reports that she does not drink alcohol.   Family History:  The patient's family history includes Bladder Cancer in her mother; CAD (age of onset: 41) in her mother; CAD (age of onset: 75) in her father; Heart attack in her father and mother; Heart disease in her father and mother; Hypertension in her mother; Lung cancer in her mother; Stroke in her father and mother.***  ROS:  Please see the history of present illness.    All other systems are reviewed and otherwise negative.   PHYSICAL EXAM:  VS:  There were no vitals taken for this visit. BMI: There is no height or weight on file to calculate BMI. Well nourished, well developed, in no acute distress HEENT: normocephalic, atraumatic Neck: no JVD, carotid bruits or masses Cardiac:  *** RRR; no significant murmurs, no rubs, or gallops Lungs:  *** CTA b/l, no wheezing, rhonchi or rales Abd: soft, nontender MS: no deformity or *** atrophy Ext: *** no edema Skin: warm and dry, no rash Neuro:  No gross deficits appreciated Psych: euthymic mood, full affect  *** ICD site is stable, no tethering or discomfort   EKG:  Not done today  Device interrogation done today and reviewed by myself:  ***  01/22/2017 TTE Study Conclusions  - Left ventricle: Abnormal septal motion. The cavity size was  moderately dilated. Wall thickness was increased in a pattern of  mild LVH. The estimated ejection fraction was 25%. Diffuse  hypokinesis.  - Aortic valve: There was mild regurgitation.  - Mitral valve: Post repair with ring no signficiant MR and stable  low mean gradient Valve area by continuity equation (using LVOT  flow): 0.82 cm^2.  - Atrial septum: No defect or patent foramen ovale was identified.  - Tricuspid valve: There was moderate regurgitation.     08/21/2016: LHC   Ost RCA to Prox RCA lesion, 10 %stenosed.  Dist RCA lesion, 30 %stenosed.  Prox Cx to Mid Cx  lesion, 10 %stenosed.  Prox LAD to Mid LAD lesion, 30 %stenosed.   1. Patent stent in the Circumflex artery with minimal restenosis 2. Mild non-obstructive disease in the RCA and LAD (of note, there was spasm in the RCA with catheter engagement that resolved with SL NTG)  Recommendations: Medical management of CAD. Continue planning for valve repair/replacement.     Recent Labs: 03/07/2019: ALT 15 02/08/2020: BUN 5; Creatinine, Ser 0.83; Hemoglobin 16.3; Platelets 174; Potassium 3.0; Sodium 140  03/07/2019: Chol/HDL Ratio 3.3; Cholesterol, Total 132; HDL 40; LDL Chol Calc (NIH) 79; Triglycerides 62   Estimated Creatinine Clearance: 59.9 mL/min (by C-G formula  based on SCr of 0.83 mg/dL).   Wt Readings from Last 3 Encounters:  02/08/20 120 lb (54.4 kg)  10/15/19 117 lb (53.1 kg)  03/18/19 123 lb 6.4 oz (56 kg)     Other studies reviewed: Additional studies/records reviewed today include: summarized above  ASSESSMENT AND PLAN:  1. ICD     ***  2. NICM 3. Chronic CHF     ***  4. CAD     ***  5. VHD     S/p MV repair 2018     ***    Disposition: F/u with ***  Current medicines are reviewed at length with the patient today.  The patient did not have any concerns regarding medicines.  Norma Fredrickson, PA-C 02/09/2020 11:15 AM     [facility] [address] [place] [address] [phone] (office)  [phone] (fax)

## 2020-02-10 ENCOUNTER — Ambulatory Visit (HOSPITAL_COMMUNITY)
Admission: RE | Admit: 2020-02-10 | Discharge: 2020-02-10 | Disposition: A | Discharge status: Home / Self Care | Payer: Medicare Other | Source: Ambulatory Visit | Attending: Pulmonary Disease | Admitting: Pulmonary Disease

## 2020-02-10 DIAGNOSIS — I428 Other cardiomyopathies: Secondary | ICD-10-CM | POA: Insufficient documentation

## 2020-02-10 DIAGNOSIS — I110 Hypertensive heart disease with heart failure: Secondary | ICD-10-CM | POA: Diagnosis not present

## 2020-02-10 DIAGNOSIS — Z9889 Other specified postprocedural states: Secondary | ICD-10-CM | POA: Diagnosis not present

## 2020-02-10 DIAGNOSIS — I5031 Acute diastolic (congestive) heart failure: Secondary | ICD-10-CM

## 2020-02-10 DIAGNOSIS — I25118 Atherosclerotic heart disease of native coronary artery with other forms of angina pectoris: Secondary | ICD-10-CM | POA: Diagnosis not present

## 2020-02-10 DIAGNOSIS — U071 COVID-19: Secondary | ICD-10-CM | POA: Diagnosis not present

## 2020-02-10 DIAGNOSIS — I10 Essential (primary) hypertension: Secondary | ICD-10-CM

## 2020-02-10 DIAGNOSIS — Z95810 Presence of automatic (implantable) cardiac defibrillator: Secondary | ICD-10-CM | POA: Insufficient documentation

## 2020-02-10 DIAGNOSIS — I5023 Acute on chronic systolic (congestive) heart failure: Secondary | ICD-10-CM | POA: Insufficient documentation

## 2020-02-10 DIAGNOSIS — Z720 Tobacco use: Secondary | ICD-10-CM | POA: Insufficient documentation

## 2020-02-10 MED ORDER — SODIUM CHLORIDE 0.9 % IV SOLN: INTRAVENOUS | Status: DC | PRN

## 2020-02-10 MED ORDER — ALBUTEROL SULFATE HFA 108 (90 BASE) MCG/ACT IN AERS: 2.0000 | INHALATION_SPRAY | Freq: Once | RESPIRATORY_TRACT | Status: DC | PRN

## 2020-02-10 MED ORDER — SOTROVIMAB 500 MG/8ML IV SOLN
500.0000 mg | Freq: Once | INTRAVENOUS | Status: AC
  Administered 2020-02-10: 500 mg via INTRAVENOUS

## 2020-02-10 MED ORDER — EPINEPHRINE 0.3 MG/0.3ML IJ SOAJ: 0.3000 mg | Freq: Once | INTRAMUSCULAR | Status: DC | PRN

## 2020-02-10 MED ORDER — FAMOTIDINE IN NACL 20-0.9 MG/50ML-% IV SOLN: 20.0000 mg | Freq: Once | INTRAVENOUS | Status: DC | PRN

## 2020-02-10 MED ORDER — METHYLPREDNISOLONE SODIUM SUCC 125 MG IJ SOLR: 125.0000 mg | Freq: Once | INTRAMUSCULAR | Status: DC | PRN

## 2020-02-10 MED ORDER — DIPHENHYDRAMINE HCL 50 MG/ML IJ SOLN: 50.0000 mg | Freq: Once | INTRAMUSCULAR | Status: DC | PRN

## 2020-02-10 NOTE — Progress Notes (Signed)
Patient reviewed Fact Sheet for Patients, Parents, and Caregivers for Emergency Use Authorization (EUA) of sotrovimab for the Treatment of Coronavirus. Patient also reviewed and is agreeable to the estimated cost of treatment. Patient is agreeable to proceed.   

## 2020-02-10 NOTE — Discharge Instructions (Signed)

## 2020-02-10 NOTE — Progress Notes (Addendum)
Diagnosis: COVID-19  Physician: Dr. Patrick Wright  Procedure: [facility] Med: Sotrovimab infusion - Provided patient with sotrovimab fact sheet for patients, parents, and caregivers prior to infusion.   Complications: No immediate complications noted  Discharge: Discharged home    

## 2020-02-11 ENCOUNTER — Ambulatory Visit (HOSPITAL_COMMUNITY): Payer: Medicare Other

## 2020-02-11 MED ORDER — NEXLETOL 180 MG PO TABS: 180.0000 mg | ORAL_TABLET | Freq: Every day | ORAL | 2 refills | Status: DC

## 2020-02-12 ENCOUNTER — Encounter: Payer: Medicare Other | Admitting: Physician Assistant

## 2020-02-17 ENCOUNTER — Telehealth: Payer: Self-pay

## 2020-02-17 NOTE — Telephone Encounter (Signed)
**Note De-Identified [name] Obfuscation** I started a Nexletol PA through covermymeds: Key: [id number]

## 2020-02-25 ENCOUNTER — Other Ambulatory Visit: Payer: Self-pay

## 2020-02-25 ENCOUNTER — Emergency Department (HOSPITAL_COMMUNITY)
Admission: EM | Admit: 2020-02-25 | Discharge: 2020-02-25 | Disposition: A | Discharge status: Home / Self Care | Payer: Medicare Other | Attending: Emergency Medicine | Admitting: Emergency Medicine

## 2020-02-25 ENCOUNTER — Encounter (HOSPITAL_COMMUNITY): Payer: Self-pay

## 2020-02-25 DIAGNOSIS — R0602 Shortness of breath: Secondary | ICD-10-CM | POA: Diagnosis not present

## 2020-02-25 DIAGNOSIS — Z5321 Procedure and treatment not carried out due to patient leaving prior to being seen by health care provider: Secondary | ICD-10-CM | POA: Insufficient documentation

## 2020-02-25 DIAGNOSIS — M549 Dorsalgia, unspecified: Secondary | ICD-10-CM | POA: Insufficient documentation

## 2020-02-25 DIAGNOSIS — Z8616 Personal history of COVID-19: Secondary | ICD-10-CM | POA: Insufficient documentation

## 2020-02-25 NOTE — ED Notes (Signed)
Per registration pt left the facility 

## 2020-02-25 NOTE — ED Triage Notes (Signed)
Pt to er, pt states that she is here for some sob for the past couple of days, states that her sob is worse today,  States that she also has some back pain, states that he had pneumonia a couple of weeks ago.  States that she was dx wtih covid a couple weeks ago.

## 2020-02-26 ENCOUNTER — Other Ambulatory Visit: Payer: Self-pay

## 2020-02-26 ENCOUNTER — Emergency Department (HOSPITAL_COMMUNITY)
Admission: EM | Admit: 2020-02-26 | Discharge: 2020-02-27 | Disposition: A | Discharge status: Home / Self Care | Payer: Medicare Other | Attending: Emergency Medicine | Admitting: Emergency Medicine

## 2020-02-26 ENCOUNTER — Encounter (HOSPITAL_COMMUNITY): Payer: Self-pay | Admitting: *Deleted

## 2020-02-26 DIAGNOSIS — M545 Low back pain, unspecified: Secondary | ICD-10-CM | POA: Insufficient documentation

## 2020-02-26 DIAGNOSIS — Z79899 Other long term (current) drug therapy: Secondary | ICD-10-CM | POA: Insufficient documentation

## 2020-02-26 DIAGNOSIS — Z8673 Personal history of transient ischemic attack (TIA), and cerebral infarction without residual deficits: Secondary | ICD-10-CM | POA: Insufficient documentation

## 2020-02-26 DIAGNOSIS — I5042 Chronic combined systolic (congestive) and diastolic (congestive) heart failure: Secondary | ICD-10-CM | POA: Diagnosis not present

## 2020-02-26 DIAGNOSIS — Z951 Presence of aortocoronary bypass graft: Secondary | ICD-10-CM | POA: Diagnosis not present

## 2020-02-26 DIAGNOSIS — R0602 Shortness of breath: Secondary | ICD-10-CM | POA: Insufficient documentation

## 2020-02-26 DIAGNOSIS — I2510 Atherosclerotic heart disease of native coronary artery without angina pectoris: Secondary | ICD-10-CM | POA: Insufficient documentation

## 2020-02-26 DIAGNOSIS — R059 Cough, unspecified: Secondary | ICD-10-CM | POA: Insufficient documentation

## 2020-02-26 DIAGNOSIS — R110 Nausea: Secondary | ICD-10-CM | POA: Diagnosis not present

## 2020-02-26 DIAGNOSIS — Z7982 Long term (current) use of aspirin: Secondary | ICD-10-CM | POA: Insufficient documentation

## 2020-02-26 DIAGNOSIS — R0600 Dyspnea, unspecified: Secondary | ICD-10-CM | POA: Diagnosis not present

## 2020-02-26 DIAGNOSIS — I110 Hypertensive heart disease with heart failure: Secondary | ICD-10-CM | POA: Diagnosis not present

## 2020-02-26 DIAGNOSIS — Z8616 Personal history of COVID-19: Secondary | ICD-10-CM | POA: Diagnosis not present

## 2020-02-26 DIAGNOSIS — Z7902 Long term (current) use of antithrombotics/antiplatelets: Secondary | ICD-10-CM | POA: Diagnosis not present

## 2020-02-26 DIAGNOSIS — F17210 Nicotine dependence, cigarettes, uncomplicated: Secondary | ICD-10-CM | POA: Diagnosis not present

## 2020-02-26 NOTE — ED Triage Notes (Signed)
Pt was dx with covid on 2/6 and has pain in back and chest with sob.  Pt was seen in [place] and had CXR, she was told to come to ER to rule out blood clots

## 2020-02-27 ENCOUNTER — Encounter (HOSPITAL_COMMUNITY): Payer: Self-pay | Admitting: Student

## 2020-02-27 ENCOUNTER — Emergency Department (HOSPITAL_COMMUNITY): Payer: Medicare Other

## 2020-02-27 DIAGNOSIS — M545 Low back pain, unspecified: Secondary | ICD-10-CM | POA: Diagnosis not present

## 2020-02-27 LAB — CBC
HCT: 46 % (ref 36.0–46.0)
Hemoglobin: 15 g/dL (ref 12.0–15.0)
MCH: 28.6 pg (ref 26.0–34.0)
MCHC: 32.6 g/dL (ref 30.0–36.0)
MCV: 87.8 fL (ref 80.0–100.0)
Platelets: 241 10*3/uL (ref 150–400)
RBC: 5.24 MIL/uL — ABNORMAL HIGH (ref 3.87–5.11)
RDW: 14.6 % (ref 11.5–15.5)
WBC: 8.4 10*3/uL (ref 4.0–10.5)
nRBC: 0 % (ref 0.0–0.2)

## 2020-02-27 LAB — BASIC METABOLIC PANEL
Anion gap: 12 (ref 5–15)
BUN: 11 mg/dL (ref 6–20)
CO2: 24 mmol/L (ref 22–32)
Calcium: 9.5 mg/dL (ref 8.9–10.3)
Chloride: 105 mmol/L (ref 98–111)
Creatinine, Ser: 0.87 mg/dL (ref 0.44–1.00)
GFR, Estimated: >60 mL/min (ref >60)
Glucose, Bld: 88 mg/dL (ref 70–99)
Potassium: 3.7 mmol/L (ref 3.5–5.1)
Sodium: 141 mmol/L (ref 135–145)

## 2020-02-27 LAB — TROPONIN I (HIGH SENSITIVITY)
Troponin I (High Sensitivity): 12 ng/L (ref <18)
Troponin I (High Sensitivity): 11 ng/L (ref <18)

## 2020-02-27 LAB — I-STAT BETA HCG BLOOD, ED (MC, WL, AP ONLY): I-stat hCG, quantitative: <5 m[IU]/mL (ref <5)

## 2020-02-27 MED ORDER — IOHEXOL 350 MG/ML SOLN
100.0000 mL | Freq: Once | INTRAVENOUS | Status: AC | PRN
  Administered 2020-02-27: 100 mL via INTRAVENOUS

## 2020-02-27 MED ORDER — ONDANSETRON HCL 4 MG PO TABS
4.0000 mg | ORAL_TABLET | Freq: Once | ORAL | Status: AC
  Administered 2020-02-27: 4 mg via ORAL
  Filled 2020-02-27: qty 1

## 2020-02-27 MED ORDER — MORPHINE SULFATE (PF) 4 MG/ML IV SOLN
4.0000 mg | Freq: Once | INTRAVENOUS | Status: AC
  Administered 2020-02-27: 4 mg via INTRAVENOUS
  Filled 2020-02-27: qty 1

## 2020-02-27 MED ORDER — LIDOCAINE 5 % EX PTCH
1.0000 | MEDICATED_PATCH | CUTANEOUS | Status: DC
  Administered 2020-02-27: 1 via TRANSDERMAL
  Filled 2020-02-27: qty 1

## 2020-02-27 MED ORDER — LORAZEPAM 2 MG/ML IJ SOLN
1.0000 mg | Freq: Once | INTRAMUSCULAR | Status: AC
  Administered 2020-02-27: 1 mg via INTRAVENOUS
  Filled 2020-02-27: qty 1

## 2020-02-27 MED ORDER — AEROCHAMBER PLUS FLO-VU LARGE MISC
Status: AC
  Filled 2020-02-27: qty 1

## 2020-02-27 MED ORDER — ALBUTEROL SULFATE HFA 108 (90 BASE) MCG/ACT IN AERS
2.0000 | INHALATION_SPRAY | Freq: Once | RESPIRATORY_TRACT | Status: AC
  Administered 2020-02-27: 2 via RESPIRATORY_TRACT
  Filled 2020-02-27: qty 6.7

## 2020-02-27 MED ORDER — AEROCHAMBER PLUS FLO-VU LARGE MISC
1.0000 | Freq: Once | Status: AC
  Administered 2020-02-27: 1

## 2020-02-27 MED ORDER — METHOCARBAMOL 500 MG PO TABS: 500.0000 mg | ORAL_TABLET | Freq: Three times a day (TID) | ORAL | 0 refills | Status: DC | PRN

## 2020-02-27 MED ORDER — LIDOCAINE 5 % EX PTCH: 1.0000 | MEDICATED_PATCH | Freq: Every day | CUTANEOUS | 0 refills | Status: DC | PRN

## 2020-02-27 NOTE — ED Provider Notes (Signed)
[facility] EMERGENCY DEPARTMENT Provider Note   CSN: [id number] Arrival date & time: 02/26/20  2252     History Chief Complaint  Patient presents with  . Back Pain    Lindsay Camacho is a 55 y.o. female with a history of nonischemic cardiomyopathy, CAD, hypertension, hyperlipidemia, mitral valve regurgitation, prior TIA, CHF with last EF of 25%, and ICD in place who presents to the emergency department with primary complaints of back pain over the past 5 days.  Patient states the pain is located to her bilateral lower back but more on the right side, it is constant, worse with movement, worse with coughing, no alleviating factors.  She has tried taking ibuprofen and tried Salonpas patches without relief.  She states that the pain is worsened and she has felt more short of breath today.  She was seen at an urgent care in [place] and was told to come to the emergency department to rule out blood clots.  She does mention when the pain gets really bad she also becomes nauseated.  She was diagnosed with Covid 02/08/2020 and had Sotrovimab infusion done 02/10/20.  She has not required hospitalization for COVID-19. Denies numbness, tingling, weakness, saddle anesthesia, incontinence to bowel/bladder, fever, chills, IV drug use, dysuria, or hx of cancer.  She denies hemoptysis, leg pain/swelling, recent long travel/surgery/hospitalization, or prior VTE.  HPI     Past Medical History:  Diagnosis Date  . Acute myocardial infarction of other lateral wall, initial episode of care 01/2013   DES CFX  . Aortoiliac occlusive disease (HCC) 08/22/2016  . CAD (coronary artery disease) 11/13/2011   50% ? proximal LCx stenosis per Cath at [facility]  . Chest pain, atypical, muscular skelatal   01/09/2014  . COVID 02/08/2020  . Dyspnea   . Heart failure (HCC)   . HLD (hyperlipidemia)   . Hypertension   . Intermediate coronary syndrome (HCC)   . LBBB (left bundle branch block)   . Mitral  regurgitation   . Nonischemic cardiomyopathy (HCC) 01/26/2017  . PVD (peripheral vascular disease) (HCC)    Right CFA stenosis per report on 11/14/2011  . 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/25/2016   Edwards McArthy-Adams IMR ETLogix ring annuloplasty (Model 4100, Serial [id number], size 26) placed via right mini thoracotomy approach  . Stenosis of left subclavian artery (HCC) 08/22/2016  . Stroke [facility])    tia with no deficits    Patient Active Problem List   Diagnosis Date Noted  . Chest pain, rule out acute myocardial infarction 07/22/2018  . 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 Nonischemic cardiomyopathy (HCC) 01/26/2017  . 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 Stenosis of left subclavian artery (HCC) 08/22/2016  . Aortoiliac occlusive disease (HCC) 08/22/2016  . Acute on chronic systolic CHF (congestive heart failure) (HCC)   . Mitral regurgitation 08/17/2016  . Heart failure (HCC) 08/17/2016  . Shortness of breath   . 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  . Diarrhea 10/05/2014  . Syncope 03/09/2014  . Chronic combined systolic (congestive) and diastolic (congestive) heart failure (HCC) 01/12/2014  . Chest pain, atypical, muscular skelatal   01/09/2014  . Diastolic CHF, acute (HCC) 01/08/2014  . Chest pain at rest 01/08/2014  . Congestive heart disease (HCC)   . Post-op pain-Right groin area 05/27/2013  . Drainage from wound-Groin area 05/27/2013  . Retroperitoneal hematoma 05/01/2013  . Retroperitoneal bleed 04/15/2013  . Anemia due to blood loss 04/15/2013  . Hyperlipidemia  with target LDL less than 70 04/15/2013  . Intermediate coronary syndrome (HCC)   . Chest pain 04/11/2013  . CAD - CFX DES 01/17/13 01/20/2013  . Rash post PCI 01/20/2013  . NSTEMI (non-ST elevated myocardial infarction) (HCC) 01/17/2013  . Chest pain, musculoskeletal 10/07/2012  . HTN (hypertension) 10/07/2012  . LBBB (left bundle branch  block) 10/07/2012  . Tobacco abuse 10/07/2012    Past Surgical History:  Procedure Laterality Date  . APPENDECTOMY    . BIV ICD INSERTION CRT-D N/A 01/24/2017   Procedure: BIV ICD INSERTION CRT-D;  Surgeon: Marinus Maw, MD;  Location: [facility] INVASIVE CV LAB;  Service: Cardiovascular;  Laterality: N/A;  . CARDIAC CATHETERIZATION  04/14/2013   Non-obstructive disease, patent CFX stent  . CARDIAC CATHETERIZATION N/A 09/21/2014   Procedure: Left Heart Cath and Coronary Angiography;  Surgeon: Lennette Bihari, MD;  Location: [facility] INVASIVE CV LAB;  Service: Cardiovascular;  Laterality: N/A;  . CORONARY ANGIOPLASTY WITH STENT PLACEMENT  01/17/2013   mild disease except 99% CFX, rx with  2.5 x 28 Alpine drug-eluting stent   . GROIN DEBRIDEMENT Right 04/14/2013   Procedure: Emergency Evacuation of Retroperitoneal Hematoma and Repair Right External Iliac Artery Pseudoaneurysm    ;  Surgeon: Sherren Kerns, MD;  Location: [facility] OR;  Service: Vascular;  Laterality: Right;  . LEFT HEART CATHETERIZATION WITH CORONARY ANGIOGRAM N/A 01/18/2013   Procedure: LEFT HEART CATHETERIZATION WITH CORONARY ANGIOGRAM;  Surgeon: Corky Crafts, MD;  Location: [facility] CATH LAB;  Service: Cardiovascular;  Laterality: N/A;  . LEFT HEART CATHETERIZATION WITH CORONARY ANGIOGRAM N/A 04/14/2013   Procedure: LEFT HEART CATHETERIZATION WITH CORONARY ANGIOGRAM;  Surgeon: Corky Crafts, MD;  Location: [facility] CATH LAB;  Service: Cardiovascular;  Laterality: N/A;  . MITRAL VALVE REPAIR Right 08/25/2016   Procedure: MINIMALLY INVASIVE MITRAL VALVE REPAIR;  Surgeon: Purcell Nails, MD;  Location: [facility] OR;  Service: Open Heart Surgery;  Laterality: Right;  . MULTIPLE EXTRACTIONS WITH ALVEOLOPLASTY N/A 08/23/2016   Procedure: Extraction of tooth #'s 17, 20-23, 26-29 with alveoloplasty;  Surgeon: [name], DDS;  Location: [facility] OR;  Service: Oral Surgery;  Laterality: N/A;  . PERCUTANEOUS CORONARY STENT INTERVENTION (PCI-S)  01/18/2013    Procedure: PERCUTANEOUS CORONARY STENT INTERVENTION (PCI-S);  Surgeon: Corky Crafts, MD;  Location: [facility] CATH LAB;  Service: Cardiovascular;;  . RIGHT/LEFT HEART CATH AND CORONARY ANGIOGRAPHY N/A 08/21/2016   Procedure: RIGHT/LEFT HEART CATH AND CORONARY ANGIOGRAPHY;  Surgeon: Kathleene Hazel, MD;  Location: [facility] INVASIVE CV LAB;  Service: Cardiovascular;  Laterality: N/A;  . TEE WITHOUT CARDIOVERSION N/A 03/06/2014   Procedure: TRANSESOPHAGEAL ECHOCARDIOGRAM (TEE);  Surgeon: Lars Masson, MD;  Location: [facility] ENDOSCOPY;  Service: Cardiovascular;  Laterality: N/A;  . TEE WITHOUT CARDIOVERSION N/A 08/18/2016   Procedure: TRANSESOPHAGEAL ECHOCARDIOGRAM (TEE);  Surgeon: Lewayne Bunting, MD;  Location: [facility] ENDOSCOPY;  Service: Cardiovascular;  Laterality: N/A;  . TEE WITHOUT CARDIOVERSION N/A 08/25/2016   Procedure: TRANSESOPHAGEAL ECHOCARDIOGRAM (TEE);  Surgeon: Purcell Nails, MD;  Location: [facility] OR;  Service: Open Heart Surgery;  Laterality: N/A;  . TUBAL LIGATION       OB History   No obstetric history on file.     Family History  Problem Relation Age of Onset  . CAD Mother 72  . Lung cancer Mother   . Bladder Cancer Mother   . Stroke Mother   . Heart disease Mother        Before age 55  . Hypertension Mother   .  Heart attack Mother   . CAD Father [id number]  . Heart disease Father        Before age 55  . Heart attack Father   . Stroke Father        Bleeding stroke    Social History   Tobacco Use  . Smoking status: Current Every Day Smoker    Packs/day: 0.50    Types: Cigarettes  . Smokeless tobacco: Never Used  Vaping Use  . Vaping Use: Never used  Substance Use Topics  . Alcohol use: No    Alcohol/week: 0.0 standard drinks  . Drug use: Yes    Types: Marijuana    Home Medications Prior to Admission medications   Medication Sig Start Date End Date Taking? Authorizing Provider  acetaminophen (TYLENOL) 500 MG tablet Take 1,000 mg by mouth every 6 (six) hours as  needed for headache (pain).    [provider]  aspirin EC 81 MG tablet Take 81 mg by mouth daily.    [provider]  atorvastatin (LIPITOR) 80 MG tablet TAKE 1 TABLET BY MOUTH DAILY 02/11/20   Corky CraftsVaranasi, Jayadeep S, MD  Bempedoic Acid (NEXLETOL) 180 MG TABS Take 180 mg by mouth daily. 02/11/20   Corky CraftsVaranasi, Jayadeep S, MD  carvedilol (COREG) 6.25 MG tablet Take 1 tablet (6.25 mg total) by mouth 2 (two) times daily with a meal. Patient taking differently: Take 6.25 mg by mouth at bedtime. 10/14/19   Corky CraftsVaranasi, Jayadeep S, MD  clopidogrel (PLAVIX) 75 MG tablet Take 1 tablet (75 mg total) by mouth daily. Please make overdue appt with Dr. Eldridge DaceVaranasi before anymore refills. 2nd attempt Patient taking differently: Take 75 mg by mouth at bedtime. Please make overdue appt with Dr. Eldridge DaceVaranasi before anymore refills. 2nd attempt 08/07/19   Corky CraftsVaranasi, Jayadeep S, MD  doxycycline (VIBRAMYCIN) 100 MG capsule Take 1 capsule (100 mg total) by mouth 2 (two) times daily. 02/08/20   Gwyneth SproutPlunkett, Whitney, MD  ezetimibe (ZETIA) 10 MG tablet Take 1 tablet (10 mg total) by mouth daily. Patient not taking: Reported on 02/08/2020 03/29/17   Robbie LisSimmons, Brittainy M, PA-C  furosemide (LASIX) 40 MG tablet Take 40 mg by mouth daily as needed for fluid or edema (shortness of breath).     [provider]  losartan (COZAAR) 25 MG tablet TAKE 1/2 TABLET BY MOUTH DAILY Patient taking differently: Take 12.5 mg by mouth at bedtime. 08/14/19   Corky CraftsVaranasi, Jayadeep S, MD  nicotine (NICODERM CQ) 7 mg/24hr patch Place 1 patch (7 mg total) onto the skin daily. 10/14/19   Marinus Mawaylor, Gregg W, MD  potassium chloride (KLOR-CON) 10 MEQ tablet Take 2 tablets (20 mEq total) by mouth daily. 02/08/20   Gwyneth SproutPlunkett, Whitney, MD    Allergies    Aldomet [methyldopa], Brilinta [ticagrelor], Other, Coumadin [warfarin sodium], Eliquis [apixaban], Lisinopril, and Penicillins  Review of Systems   Review of Systems  Constitutional: Negative for chills and  fever.  Respiratory: Positive for cough and shortness of breath.   Cardiovascular: Negative for leg swelling.  Gastrointestinal: Positive for nausea. Negative for abdominal pain, anal bleeding, constipation and diarrhea.  Musculoskeletal: Positive for back pain.  Neurological: Negative for weakness and numbness.       Negative for incontinence or saddle anesthesia.  All other systems reviewed and are negative.   Physical Exam Updated Vital Signs BP (!) 160/73 (BP Location: Right Arm)   Pulse 86   Temp (!) 97 F (36.1 C) (Axillary)   Resp 16   SpO2 100%  Physical Exam Vitals and nursing note reviewed.  Constitutional:      General: She is not in acute distress.    Appearance: She is well-developed and well-nourished. She is not toxic-appearing.  HENT:     Head: Normocephalic and atraumatic.  Eyes:     General:        Right eye: No discharge.        Left eye: No discharge.     Conjunctiva/sclera: Conjunctivae normal.  Cardiovascular:     Rate and Rhythm: Normal rate and regular rhythm.     Pulses: Intact distal pulses.  Pulmonary:     Effort: Pulmonary effort is normal. No respiratory distress.     Breath sounds: Normal breath sounds. No wheezing, rhonchi or rales.  Abdominal:     General: There is no distension.     Palpations: Abdomen is soft.     Tenderness: There is no abdominal tenderness. There is no guarding or rebound.  Musculoskeletal:     Cervical back: Normal range of motion and neck supple. No spinous process tenderness or muscular tenderness.     Right lower leg: No edema.     Left lower leg: No edema.     Comments: No obvious deformity, appreciable swelling, erythema, ecchymosis, significant open wounds, or increased warmth.  Extremities: Normal ROM. Nontender.  Back: Patient diffusely tender throughout lumbar region including bilateral paraspinal muscles.  No point/focal vertebral tenderness, no palpable step off or crepitus.   Skin:    General: Skin is  warm and dry.     Findings: No rash.  Neurological:     Mental Status: She is alert.     Deep Tendon Reflexes:     Reflex Scores:      Patellar reflexes are 2+ on the right side and 2+ on the left side.    Comments: Sensation grossly intact to bilateral lower extremities. 5/5 symmetric strength with plantar/dorsiflexion bilaterally. Gait is intact without obvious foot drop.   Psychiatric:        Mood and Affect: Mood is anxious.        Behavior: Behavior normal.     ED Results / Procedures / Treatments   Labs (all labs ordered are listed, but only abnormal results are displayed) Labs Reviewed  CBC - Abnormal; Notable for the following components:      Result Value   RBC 5.24 (*)    All other components within normal limits  BASIC METABOLIC PANEL  I-STAT BETA HCG BLOOD, ED ([facility], [facility], [facility] ONLY)  TROPONIN I (HIGH SENSITIVITY)  TROPONIN I (HIGH SENSITIVITY)    EKG EKG Interpretation  Date/Time:  Thursday February 26 2020 23:56:59 EST Ventricular Rate:  73 PR Interval:  138 QRS Duration: 126 QT Interval:  456 QTC Calculation: 502 R Axis:   76 Text Interpretation: Atrial-sensed ventricular-paced rhythm Biventricular pacemaker detected Abnormal ECG nonspceific ST changes in inferior leads compared to previous. other ST changes seem to be similar to previous does appear to have a drastic axis change from a couple weeks ago. Confirmed by [name] [phone]) on 02/27/2020 12:09:05 AM   Radiology CT Angio Chest PE W and/or Wo Contrast  Result Date: 02/27/2020 CLINICAL DATA:  Chest and back pain, normal radiograph. PE suspected EXAM: CT ANGIOGRAPHY CHEST WITH CONTRAST TECHNIQUE: Multidetector CT imaging of the chest was performed using the standard protocol during bolus administration of intravenous contrast. Multiplanar CT image reconstructions and MIPs were obtained to evaluate the vascular anatomy. CONTRAST:  OMNIPAQUE  IOHEXOL 350 MG/ML SOLN COMPARISON:  CT 02/08/2020,  comparison radiograph unavailable FINDINGS: Cardiovascular: Satisfactory opacification the pulmonary arteries to the segmental level. No pulmonary artery filling defects are identified. Central pulmonary arteries are normal caliber. Mild cardiomegaly with right atrial enlargement. Left chest wall battery pack with leads terminating at the apical septum and right atrium. Postsurgical changes from prior mitral annuloplasty. Suboptimal opacification of the thoracic aorta for luminal assessment. Atherosclerotic plaque within the normal caliber aorta. No abnormal hyperdense mural thickening or plaque displacement. No periaortic stranding or hemorrhage. Normal 3 vessel branching of the aortic arch with plaque in the proximal great vessels. Reflux of contrast into the hepatic veins and IVC. Mediastinum/Nodes: No mediastinal fluid or gas. Normal thyroid gland and thoracic inlet. No acute abnormality of the trachea or esophagus. No worrisome mediastinal, hilar or axillary adenopathy. Lungs/Pleura: Hypoventilatory changes are present in the lungs with more bandlike areas of mixed scarring and subsegmental atelectasis in the right middle and lower lobe. Some mosaic attenuation may reflect underlying air trapping or small airways disease though possibly accentuated by imaging during exhalation for the angiographic technique. Few scattered tiny pulmonary cysts. No concerning pulmonary nodule or mass is seen. Upper Abdomen: No acute abnormalities present in the visualized portions of the upper abdomen. Upper abdominal atherosclerosis. Musculoskeletal: No acute osseous abnormality or suspicious osseous lesion. No worrisome chest wall mass or lesion. Left chest wall pacer pack, as above. Review of the MIP images confirms the above findings. IMPRESSION: 1. No evidence of pulmonary embolism. 2. Mild cardiomegaly with right atrial enlargement. Reflux of contrast into the hepatic veins and IVC, suggestive of right heart dysfunction. 3.  Hypoventilatory changes in the lungs with more bandlike areas of mixed scarring and subsegmental atelectasis in the right middle and lower lobe. 4. Some mosaic attenuation may reflect underlying air trapping or small airways disease though possibly accentuated by imaging during exhalation for the angiographic technique. 5. Aortic Atherosclerosis (ICD10-I70.0). Electronically Signed   By: Kreg Shropshire M.D.   On: 02/27/2020 03:01    Procedures Procedures   Medications Ordered in ED Medications  lidocaine (LIDODERM) 5 % 1 patch (1 patch Transdermal Patch Applied 02/27/20 0334)  ondansetron (ZOFRAN) tablet 4 mg (4 mg Oral Given 02/27/20 0130)  iohexol (OMNIPAQUE) 350 MG/ML injection 100 mL (100 mLs Intravenous Contrast Given 02/27/20 0248)  albuterol (VENTOLIN HFA) 108 (90 Base) MCG/ACT inhaler 2 puff (2 puffs Inhalation Given 02/27/20 0337)  AeroChamber Plus Flo-Vu Large MISC 1 each (1 each Other Given 02/27/20 0341)  morphine 4 MG/ML injection 4 mg (4 mg Intravenous Given 02/27/20 0335)  LORazepam (ATIVAN) injection 1 mg (1 mg Intravenous Given 02/27/20 0345)  AeroChamber Plus Flo-Vu Large MISC (  Given 02/27/20 0345)    ED Course  I have reviewed the triage vital signs and the nursing notes.  Pertinent labs & imaging results that were available during my care of the patient were reviewed by me and considered in my medical decision making (see chart for details).    MDM Rules/Calculators/A&P                          Patient presents to the ED with complaints of lower back pain & dyspnea.  Nontoxic, vitals w/ elevated BP- improved during ED visit.   Additional history obtained:  Additional history obtained from chart review & nursing note review.   Lab Tests:  I reviewed and interpreted labs, which included:  CBC, BMP, pregnancy test,  troponins: Grossly unremarkable.  Troponins are flat.  Imaging Studies ordered:  CT angio chest was ordered per triage, I independently reviewed, formal  radiology impression shows:  1. No evidence of pulmonary embolism. 2. Mild cardiomegaly with right atrial enlargement. Reflux of contrast into the hepatic veins and IVC, suggestive of right heart dysfunction. 3. Hypoventilatory changes in the lungs with more bandlike areas of mixed scarring and subsegmental atelectasis in the right middle and lower lobe. 4. Some mosaic attenuation may reflect underlying air trapping or small airways disease though possibly accentuated by imaging during exhalation for the angiographic technique. 5. Aortic Atherosclerosis (ICD10-I70.0).  ED Course:  Emergency department work-up was overall reassuring.  Her initial EKG does look somewhat change from 02/08/2020, however similar to her other prior EKGs, repeat in the ED seems similar, opponents are flat, I do not suspect ACS.  Her CT angio is negative for pulmonary embolism.  She does have some mild cardiomegaly with findings suggestive of right heart dysfunction, patient does have known CHF, no findings of pulmonary edema or peripheral edema-cardiology follow-up.  No focal infiltrate or leukocytosis to raise concern for superimposed bacterial pneumonia following Covid.  No pneumothorax.  No critical anemia.  In terms of her lower back discomfort no direct trauma or point/focal vertebral tenderness to raise concern for fracture, she has no history of IVDU or fevers to raise concern for epidural abscess, no urinary symptoms reported to raise concern for pyelonephritis or nephrolithiasis, she has no neurologic deficits to suggest cord compression or cauda equina syndrome.  Her back pain is reproducible with palpation, she has been coughing a lot with Covid, suspect this pain may be muscular in nature.  She was given analgesics for pain, Ativan for nausea as well as due to seeming quite anxious on arrival.  She was given albuterol inhaler to help with her shortness of breath, no obvious wheezing on exam, however this did seem to make her  feel better.  Overall reassuring ED work-up, not hypoxic in respiratory distress, able to ambulate in the ED without hypoxia.  Overall appears appropriate for discharge home with supportive care. Will need a ride home after narcotics/benzos in the ED I discussed results, treatment plan, need for follow-up, and return precautions with the patient. Provided opportunity for questions, patient confirmed understanding and is in agreement with plan.    Portions of this note were generated with Scientist, clinical (histocompatibility and immunogenetics). Dictation errors may occur despite best attempts at proofreading.  Final Clinical Impression(s) / ED Diagnoses Final diagnoses:  Acute bilateral low back pain without sciatica  Dyspnea, unspecified type    Rx / DC Orders ED Discharge Orders         Ordered    methocarbamol (ROBAXIN) 500 MG tablet  Every 8 hours PRN        02/27/20 0643    lidocaine (LIDODERM) 5 %  Daily PRN        02/27/20 0643           Cherly Anderson, PA-C 02/27/20 0645    [name], MD 02/27/20 [phone]

## 2020-02-27 NOTE — Discharge Instructions (Addendum)
You were seen in the emergency department today for back pain and trouble breathing.  Your labs were overall reassuring.  Your CT scan did not show findings of a blood clot.  Your CT scan did show findings that the right side of your heart may not be pumping as efficiently as it should be, please follow-up with your cardiology doctor for further evaluation of this.  We are sending you home with an albuterol inhaler, use 1 to 2 puffs every 4-6 hours as needed for shortness of breath.  We are sending you home with Lidoderm patches, apply 1 patch to area most significant pain once per day and remove within 12 hours.  We are also sending home with Robaxin to take every 8 hours as needed for back pain, this is a muscle relaxer which can make you sleepy, do not drive or operate heavy machinery when taking this medicine, do not consume alcohol or take other sedating medications such as benzos or narcotics while taking this medication.  We have prescribed you new medication(s) today. Discuss the medications prescribed today with your pharmacist as they can have adverse effects and interactions with your other medicines including over the counter and prescribed medications. Seek medical evaluation if you start to experience new or abnormal symptoms after taking one of these medicines, seek care immediately if you start to experience difficulty breathing, feeling of your throat closing, facial swelling, or rash as these could be indications of a more serious allergic reaction  Please apply heat to your lower back.  Please follow-up with your primary care provider within 3 days.  Return to the ER for new or worsening symptoms including but not limited to worsening pain, increased trouble breathing, chest pain, passing out, coughing up blood, fever, numbness, weakness, inability to walk, loss of control bowel or bladder function, or any other concerns.

## 2020-03-05 ENCOUNTER — Ambulatory Visit (INDEPENDENT_AMBULATORY_CARE_PROVIDER_SITE_OTHER): Payer: Medicare Other | Admitting: Student

## 2020-03-05 ENCOUNTER — Other Ambulatory Visit: Payer: Self-pay

## 2020-03-05 ENCOUNTER — Encounter: Payer: Self-pay | Admitting: Student

## 2020-03-05 VITALS — BP 160/90 | HR 71 | Ht 61.5 in | Wt 112.0 lb

## 2020-03-05 DIAGNOSIS — I25118 Atherosclerotic heart disease of native coronary artery with other forms of angina pectoris: Secondary | ICD-10-CM | POA: Diagnosis not present

## 2020-03-05 DIAGNOSIS — F17210 Nicotine dependence, cigarettes, uncomplicated: Secondary | ICD-10-CM | POA: Diagnosis not present

## 2020-03-05 DIAGNOSIS — I5022 Chronic systolic (congestive) heart failure: Secondary | ICD-10-CM | POA: Diagnosis not present

## 2020-03-05 DIAGNOSIS — I428 Other cardiomyopathies: Secondary | ICD-10-CM

## 2020-03-05 DIAGNOSIS — K219 Gastro-esophageal reflux disease without esophagitis: Secondary | ICD-10-CM

## 2020-03-05 DIAGNOSIS — I5023 Acute on chronic systolic (congestive) heart failure: Secondary | ICD-10-CM | POA: Diagnosis not present

## 2020-03-05 DIAGNOSIS — I10 Essential (primary) hypertension: Secondary | ICD-10-CM

## 2020-03-05 DIAGNOSIS — Z720 Tobacco use: Secondary | ICD-10-CM

## 2020-03-05 LAB — CUP PACEART INCLINIC DEVICE CHECK
Battery Remaining Longevity: 32 mo
Battery Voltage: 2.95 V
Brady Statistic AP VP Percent: 0.03 %
Brady Statistic AP VS Percent: 0.01 %
Brady Statistic AS VP Percent: 99.88 %
Brady Statistic AS VS Percent: 0.08 %
Brady Statistic RA Percent Paced: 0.04 %
Brady Statistic RV Percent Paced: 99.75 %
Date Time Interrogation Session: 20220304094247
HighPow Impedance: 71 Ohm
Implantable Lead Implant Date: 20190124
Implantable Lead Implant Date: 20190124
Implantable Lead Implant Date: 20190124
Implantable Lead Location: 753859
Implantable Lead Location: 753860
Implantable Lead Location: 753860
Implantable Lead Model: 3830
Implantable Lead Model: 5076
Implantable Lead Model: 6935
Implantable Pulse Generator Implant Date: 20190124
Lead Channel Impedance Value: 209 Ohm
Lead Channel Impedance Value: 323 Ohm
Lead Channel Impedance Value: 437 Ohm
Lead Channel Impedance Value: 437 Ohm
Lead Channel Impedance Value: 456 Ohm
Lead Channel Impedance Value: 532 Ohm
Lead Channel Pacing Threshold Amplitude: 0.5 V
Lead Channel Pacing Threshold Amplitude: 0.75 V
Lead Channel Pacing Threshold Amplitude: 1.125 V
Lead Channel Pacing Threshold Pulse Width: 0.4 ms
Lead Channel Pacing Threshold Pulse Width: 0.4 ms
Lead Channel Pacing Threshold Pulse Width: 0.5 ms
Lead Channel Sensing Intrinsic Amplitude: 1.875 mV
Lead Channel Sensing Intrinsic Amplitude: 2.125 mV
Lead Channel Sensing Intrinsic Amplitude: 8.25 mV
Lead Channel Sensing Intrinsic Amplitude: 8.375 mV
Lead Channel Setting Pacing Amplitude: 1.25 V
Lead Channel Setting Pacing Amplitude: 1.5 V
Lead Channel Setting Pacing Amplitude: 2.5 V
Lead Channel Setting Pacing Pulse Width: 0.4 ms
Lead Channel Setting Pacing Pulse Width: 0.5 ms
Lead Channel Setting Sensing Sensitivity: 0.3 mV

## 2020-03-05 MED ORDER — CARVEDILOL 12.5 MG PO TABS: 12.5000 mg | ORAL_TABLET | Freq: Two times a day (BID) | ORAL | 3 refills | Status: DC

## 2020-03-05 MED ORDER — PANTOPRAZOLE SODIUM 40 MG PO TBEC: 40.0000 mg | DELAYED_RELEASE_TABLET | Freq: Every day | ORAL | 1 refills | Status: DC

## 2020-03-05 MED ORDER — FUROSEMIDE 40 MG PO TABS: 40.0000 mg | ORAL_TABLET | Freq: Every day | ORAL | 3 refills | Status: DC | PRN

## 2020-03-05 NOTE — Patient Instructions (Signed)
Medication Instructions:  Your physician has recommended you make the following change in your medication:   INCREASE: Carvedilol to 12.5mg  two times daily START: Pantoprazole 40mg  daily (Get future refills from PCP)  *If you need a refill on your cardiac medications before your next appointment, please call your pharmacy*   Lab Work: None Today If you have labs (blood work) drawn today and your tests are completely normal, you will receive your results only by: MyChart Message (if you have MyChart) OR . A paper copy in the mail If you have any lab test that is abnormal or we need to change your treatment, we will call you to review the results.   Testing/Procedures: Your physician has requested that you have an echocardiogram. Echocardiography is a painless test that uses sound waves to create images of your heart. It provides your doctor with information about the size and shape of your heart and how well your heart's chambers and valves are working. This procedure takes approximately one hour. There are no restrictions for this procedure.   Follow-Up: At [facility], you and your health needs are our priority.  As part of our continuing mission to provide you with exceptional heart care, we have created designated Provider Care Teams.  These Care Teams include your primary Cardiologist (physician) and Advanced Practice Providers (APPs -  Physician Assistants and Nurse Practitioners) who all work together to provide you with the care you need, when you need it.   Your next appointment:   6 month(s)  The format for your next appointment:   In Person  Provider:   You may see [facility], MD or one of the following Advanced Practice Providers on your designated Care Team:    Sharrell Ku, NP  Gypsy Balsam, PA-C  Francis Dowse "[place]" [place], [place]

## 2020-03-05 NOTE — Progress Notes (Signed)
Electrophysiology Office Note Date: 03/05/2020  ID:  Lindsay Camacho, DOB 1965/12/19, MRN [id number]  PCP: Patient, No Pcp Per Primary Cardiologist: Lance Muss, MD Electrophysiologist: Lewayne Bunting, MD   CC: Routine ICD follow-up  Lindsay Camacho is a 55 y.o. female seen today for Lewayne Bunting, MD for routine electrophysiology followup.  Since last being seen in our clinic the patient reports doing overall about the same. She continues to smoke but is using nicotine patches. She has chronic GERD that was previously controlled on protonix. she denies chest pain, palpitations, dyspnea, PND, orthopnea, nausea, vomiting, dizziness, syncope, edema, weight gain, or early satiety. She has not had ICD shocks.   Device History: Medtronic "BiV" (RA, RV, and HIS lead) ICD implanted 01/2017 for chronic systolic CHF History of appropriate therapy: No History of AAD therapy: No   Past Medical History:  Diagnosis Date  . Acute myocardial infarction of other lateral wall, initial episode of care 01/2013   DES CFX  . Aortoiliac occlusive disease (HCC) 08/22/2016  . CAD (coronary artery disease) 11/13/2011   50% ? proximal LCx stenosis per [facility] at [facility]  . Chest pain, atypical, muscular skelatal   01/09/2014  . COVID 02/08/2020  . Dyspnea   . Heart failure (HCC)   . HLD (hyperlipidemia)   . Hypertension   . Intermediate coronary syndrome (HCC)   . LBBB (left bundle branch block)   . Mitral regurgitation   . Nonischemic cardiomyopathy (HCC) 01/26/2017  . PVD (peripheral vascular disease) (HCC)    Right CFA stenosis per report on 11/14/2011  . 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/25/2016   Edwards McArthy-Adams IMR ETLogix ring annuloplasty (Model 4100, Serial [id number], size 26) placed via right mini thoracotomy approach  . Stenosis of left subclavian artery (HCC) 08/22/2016  . Stroke [facility])    tia with no deficits   Past Surgical History:  Procedure Laterality Date  . APPENDECTOMY    .  BIV ICD INSERTION CRT-D N/A 01/24/2017   Procedure: BIV ICD INSERTION CRT-D;  Surgeon: Marinus Maw, MD;  Location: [facility] INVASIVE CV LAB;  Service: Cardiovascular;  Laterality: N/A;  . CARDIAC CATHETERIZATION  04/14/2013   Non-obstructive disease, patent CFX stent  . CARDIAC CATHETERIZATION N/A 09/21/2014   Procedure: Left Heart Cath and Coronary Angiography;  Surgeon: Lennette Bihari, MD;  Location: [facility] INVASIVE CV LAB;  Service: Cardiovascular;  Laterality: N/A;  . CORONARY ANGIOPLASTY WITH STENT PLACEMENT  01/17/2013   mild disease except 99% CFX, rx with  2.5 x 28 Alpine drug-eluting stent   . GROIN DEBRIDEMENT Right 04/14/2013   Procedure: Emergency Evacuation of Retroperitoneal Hematoma and Repair Right External Iliac Artery Pseudoaneurysm    ;  Surgeon: Sherren Kerns, MD;  Location: [facility] OR;  Service: Vascular;  Laterality: Right;  . LEFT HEART CATHETERIZATION WITH CORONARY ANGIOGRAM N/A 01/18/2013   Procedure: LEFT HEART CATHETERIZATION WITH CORONARY ANGIOGRAM;  Surgeon: Corky Crafts, MD;  Location: [facility] CATH LAB;  Service: Cardiovascular;  Laterality: N/A;  . LEFT HEART CATHETERIZATION WITH CORONARY ANGIOGRAM N/A 04/14/2013   Procedure: LEFT HEART CATHETERIZATION WITH CORONARY ANGIOGRAM;  Surgeon: Corky Crafts, MD;  Location: [facility] CATH LAB;  Service: Cardiovascular;  Laterality: N/A;  . MITRAL VALVE REPAIR Right 08/25/2016   Procedure: MINIMALLY INVASIVE MITRAL VALVE REPAIR;  Surgeon: Purcell Nails, MD;  Location: [facility] OR;  Service: Open Heart Surgery;  Laterality: Right;  . MULTIPLE EXTRACTIONS WITH ALVEOLOPLASTY N/A 08/23/2016   Procedure: Extraction of tooth #'  s 78, 20-23, 26-29 with alveoloplasty;  Surgeon: [name], DDS;  Location: [facility] OR;  Service: Oral Surgery;  Laterality: N/A;  . PERCUTANEOUS CORONARY STENT INTERVENTION (PCI-S)  01/18/2013   Procedure: PERCUTANEOUS CORONARY STENT INTERVENTION (PCI-S);  Surgeon: Corky Crafts, MD;  Location: [facility] CATH LAB;   Service: Cardiovascular;;  . RIGHT/LEFT HEART CATH AND CORONARY ANGIOGRAPHY N/A 08/21/2016   Procedure: RIGHT/LEFT HEART CATH AND CORONARY ANGIOGRAPHY;  Surgeon: Kathleene Hazel, MD;  Location: [facility] INVASIVE CV LAB;  Service: Cardiovascular;  Laterality: N/A;  . TEE WITHOUT CARDIOVERSION N/A 03/06/2014   Procedure: TRANSESOPHAGEAL ECHOCARDIOGRAM (TEE);  Surgeon: Lars Masson, MD;  Location: [facility] ENDOSCOPY;  Service: Cardiovascular;  Laterality: N/A;  . TEE WITHOUT CARDIOVERSION N/A 08/18/2016   Procedure: TRANSESOPHAGEAL ECHOCARDIOGRAM (TEE);  Surgeon: Lewayne Bunting, MD;  Location: [facility] ENDOSCOPY;  Service: Cardiovascular;  Laterality: N/A;  . TEE WITHOUT CARDIOVERSION N/A 08/25/2016   Procedure: TRANSESOPHAGEAL ECHOCARDIOGRAM (TEE);  Surgeon: Purcell Nails, MD;  Location: [facility] OR;  Service: Open Heart Surgery;  Laterality: N/A;  . TUBAL LIGATION      Current Outpatient Medications  Medication Sig Dispense Refill  . acetaminophen (TYLENOL) 500 MG tablet Take 1,000 mg by mouth every 6 (six) hours as needed for headache (pain).    Marland Kitchen aspirin EC 81 MG tablet Take 81 mg by mouth daily.    Marland Kitchen atorvastatin (LIPITOR) 80 MG tablet TAKE 1 TABLET BY MOUTH DAILY 90 tablet 2  . Bempedoic Acid (NEXLETOL) 180 MG TABS Take 180 mg by mouth daily. 90 tablet 2  . clopidogrel (PLAVIX) 75 MG tablet Take 1 tablet (75 mg total) by mouth daily. Please make overdue appt with Dr. Eldridge Dace before anymore refills. 2nd attempt 15 tablet 0  . lidocaine (LIDODERM) 5 % Place 1 patch onto the skin daily as needed. Apply patch to area most significant pain once per day.  Remove and discard patch within 12 hours of application. 30 patch 0  . losartan (COZAAR) 25 MG tablet TAKE 1/2 TABLET BY MOUTH DAILY 45 tablet 1  . methocarbamol (ROBAXIN) 500 MG tablet Take 1 tablet (500 mg total) by mouth every 8 (eight) hours as needed for muscle spasms. 15 tablet 0  . nicotine (NICODERM CQ) 7 mg/24hr patch Place 1 patch (7 mg total)  onto the skin daily. 28 patch 0  . pantoprazole (PROTONIX) 40 MG tablet Take 1 tablet (40 mg total) by mouth daily. Future refills need to be from PCP! 30 tablet 1  . potassium chloride (KLOR-CON) 10 MEQ tablet Take 2 tablets (20 mEq total) by mouth daily. 20 tablet 0  . carvedilol (COREG) 12.5 MG tablet Take 1 tablet (12.5 mg total) by mouth 2 (two) times daily with a meal. 180 tablet 3  . furosemide (LASIX) 40 MG tablet Take 1 tablet (40 mg total) by mouth daily as needed for fluid or edema (shortness of breath). 90 tablet 3   No current facility-administered medications for this visit.    Allergies:   Aldomet [methyldopa], Brilinta [ticagrelor], Other, Coumadin [warfarin sodium], Eliquis [apixaban], Lisinopril, and Penicillins   Social History: Social History   Socioeconomic History  . Marital status: Single    Spouse name: Not on file  . Number of children: Not on file  . Years of education: Not on file  . Highest education level: Not on file  Occupational History  . Not on file  Tobacco Use  . Smoking status: Current Every Day Smoker    Packs/day:  0.50    Types: Cigarettes  . Smokeless tobacco: Never Used  Vaping Use  . Vaping Use: Never used  Substance and Sexual Activity  . Alcohol use: No    Alcohol/week: 0.0 standard drinks  . Drug use: Yes    Types: Marijuana  . Sexual activity: Yes    Birth control/protection: None  Other Topics Concern  . Not on file  Social History Narrative  . Not on file   Social Determinants of Health   Financial Resource Strain: Not on file  Food Insecurity: Not on file  Transportation Needs: Not on file  Physical Activity: Not on file  Stress: Not on file  Social Connections: Not on file  Intimate Partner Violence: Not on file    Family History: Family History  Problem Relation Age of Onset  . CAD Mother 63  . Lung cancer Mother   . Bladder Cancer Mother   . Stroke Mother   . Heart disease Mother        Before age 56  .  Hypertension Mother   . Heart attack Mother   . CAD Father 40  . Heart disease Father        Before age 23  . Heart attack Father   . Stroke Father        Bleeding stroke    Review of Systems: All other systems reviewed and are otherwise negative except as noted above.   Physical Exam: Vitals:   03/05/20 0915  BP: (!) 160/90  Pulse: 71  SpO2: 98%  Weight: 112 lb (50.8 kg)  Height: 5' 1.5" (1.562 m)     GEN- The patient is well appearing, alert and oriented x 3 today.   HEENT: normocephalic, atraumatic; sclera clear, conjunctiva pink; hearing intact; oropharynx clear; neck supple, no JVP Lymph- no cervical lymphadenopathy Lungs- Clear to ausculation bilaterally, normal work of breathing.  No wheezes, rales, rhonchi Heart- Regular rate and rhythm, no murmurs, rubs or gallops, PMI not laterally displaced GI- soft, non-tender, non-distended, bowel sounds present, no hepatosplenomegaly Extremities- no clubbing or cyanosis. No edema; DP/PT/radial pulses 2+ bilaterally MS- no significant deformity or atrophy Skin- warm and dry, no rash or lesion; ICD pocket well healed Psych- euthymic mood, full affect Neuro- strength and sensation are intact  ICD interrogation- reviewed in detail today,  See PACEART report  EKG:  EKG is not ordered today. The ekg ordered 02/26/20 shows A sensed V paced 73 bpm  Recent Labs: 03/07/2019: ALT 15 02/27/2020: BUN 11; Creatinine, Ser 0.87; Hemoglobin 15.0; Platelets 241; Potassium 3.7; Sodium 141   Wt Readings from Last 3 Encounters:  03/05/20 112 lb (50.8 kg)  02/25/20 115 lb (52.2 kg)  02/08/20 120 lb (54.4 kg)     Other studies Reviewed: Additional studies/ records that were reviewed today include: Previous EP and gen cards notes.   Assessment and Plan:  1.  Chronic systolic dysfunction s/p Medtronic CRT-D  euvolemic today Stable on an appropriate medical regimen Normal ICD function See Pace Art report No changes today Update echo.   Increase coreg to 12.5 mg BID.  Continue losartan 12.5 mg daily. Declines up titration and has previously failed Entresto due to hyptotension.  2. HTN Elevated on arrival, previously had hypotension requiring downtitration of meds Will cautiously increase her coreg back up. Refuses losartan increase. Intolerant to [employer]  3. GERD Will fill protonix with refill x 1. Needs to re-establish with PCP.   4. Tobacco abuse She continues to smoke. Encouraged cessation.  She says nicotine patches irritate her skin and give her nightmares.  Current medicines are reviewed at length with the patient today.   The patient does not have concerns regarding her medicines.  The following changes were made today:  none  Labs/ tests ordered today include:  Orders Placed This Encounter  Procedures  . CUP PACEART INCLINIC DEVICE CHECK  . ECHOCARDIOGRAM COMPLETE    Disposition:   Follow up with Dr. Ladona Ridgel  6 months  Signed, Graciella Freer, PA-C  03/05/2020 9:52 AM  [facility] [address] [place] [address] [phone] (office) [phone] (fax)

## 2020-03-08 ENCOUNTER — Other Ambulatory Visit: Payer: Self-pay | Admitting: Interventional Cardiology

## 2020-03-31 ENCOUNTER — Other Ambulatory Visit: Payer: Self-pay

## 2020-03-31 ENCOUNTER — Ambulatory Visit (HOSPITAL_COMMUNITY): Payer: Medicare Other | Attending: Student

## 2020-03-31 DIAGNOSIS — I5023 Acute on chronic systolic (congestive) heart failure: Secondary | ICD-10-CM | POA: Insufficient documentation

## 2020-03-31 LAB — ECHOCARDIOGRAM COMPLETE
Area-P 1/2: 3.4 cm2
MV VTI: 1.33 cm2
P 1/2 time: 423 msec
S' Lateral: 2.9 cm

## 2020-04-06 ENCOUNTER — Ambulatory Visit (INDEPENDENT_AMBULATORY_CARE_PROVIDER_SITE_OTHER): Payer: Medicare Other

## 2020-04-06 DIAGNOSIS — I428 Other cardiomyopathies: Secondary | ICD-10-CM

## 2020-04-07 LAB — CUP PACEART REMOTE DEVICE CHECK
Battery Remaining Longevity: 30 mo
Battery Voltage: 2.95 V
Brady Statistic AP VP Percent: 0.04 %
Brady Statistic AP VS Percent: 0.01 %
Brady Statistic AS VP Percent: 99.89 %
Brady Statistic AS VS Percent: 0.06 %
Brady Statistic RA Percent Paced: 0.05 %
Brady Statistic RV Percent Paced: 99.67 %
Date Time Interrogation Session: 20220405033524
HighPow Impedance: 68 Ohm
Implantable Lead Implant Date: 20190124
Implantable Lead Implant Date: 20190124
Implantable Lead Implant Date: 20190124
Implantable Lead Location: 753859
Implantable Lead Location: 753860
Implantable Lead Location: 753860
Implantable Lead Model: 3830
Implantable Lead Model: 5076
Implantable Lead Model: 6935
Implantable Pulse Generator Implant Date: 20190124
Lead Channel Impedance Value: 209 Ohm
Lead Channel Impedance Value: 304 Ohm
Lead Channel Impedance Value: 380 Ohm
Lead Channel Impedance Value: 399 Ohm
Lead Channel Impedance Value: 437 Ohm
Lead Channel Impedance Value: 532 Ohm
Lead Channel Pacing Threshold Amplitude: 0.375 V
Lead Channel Pacing Threshold Amplitude: 0.75 V
Lead Channel Pacing Threshold Amplitude: 1.375 V
Lead Channel Pacing Threshold Pulse Width: 0.4 ms
Lead Channel Pacing Threshold Pulse Width: 0.4 ms
Lead Channel Pacing Threshold Pulse Width: 0.5 ms
Lead Channel Sensing Intrinsic Amplitude: 0.75 mV
Lead Channel Sensing Intrinsic Amplitude: 0.75 mV
Lead Channel Sensing Intrinsic Amplitude: 8.25 mV
Lead Channel Sensing Intrinsic Amplitude: 8.25 mV
Lead Channel Setting Pacing Amplitude: 1.25 V
Lead Channel Setting Pacing Amplitude: 1.5 V
Lead Channel Setting Pacing Amplitude: 2.75 V
Lead Channel Setting Pacing Pulse Width: 0.4 ms
Lead Channel Setting Pacing Pulse Width: 0.5 ms
Lead Channel Setting Sensing Sensitivity: 0.3 mV

## 2020-04-16 NOTE — Progress Notes (Signed)
Remote ICD transmission.   

## 2020-06-22 ENCOUNTER — Other Ambulatory Visit: Payer: Self-pay | Admitting: Interventional Cardiology

## 2020-06-22 DIAGNOSIS — K219 Gastro-esophageal reflux disease without esophagitis: Secondary | ICD-10-CM

## 2020-07-06 ENCOUNTER — Ambulatory Visit (INDEPENDENT_AMBULATORY_CARE_PROVIDER_SITE_OTHER): Payer: Medicare Other

## 2020-07-06 DIAGNOSIS — I428 Other cardiomyopathies: Secondary | ICD-10-CM

## 2020-07-06 LAB — CUP PACEART REMOTE DEVICE CHECK
Battery Remaining Longevity: 26 mo
Battery Voltage: 2.94 V
Brady Statistic AP VP Percent: 0.04 %
Brady Statistic AP VS Percent: 0.01 %
Brady Statistic AS VP Percent: 99.86 %
Brady Statistic AS VS Percent: 0.09 %
Brady Statistic RA Percent Paced: 0.05 %
Brady Statistic RV Percent Paced: 99.56 %
Date Time Interrogation Session: 20220705033423
HighPow Impedance: 71 Ohm
Implantable Lead Implant Date: 20190124
Implantable Lead Implant Date: 20190124
Implantable Lead Implant Date: 20190124
Implantable Lead Location: 753859
Implantable Lead Location: 753860
Implantable Lead Location: 753860
Implantable Lead Model: 3830
Implantable Lead Model: 5076
Implantable Lead Model: 6935
Implantable Pulse Generator Implant Date: 20190124
Lead Channel Impedance Value: 209 Ohm
Lead Channel Impedance Value: 266 Ohm
Lead Channel Impedance Value: 399 Ohm
Lead Channel Impedance Value: 399 Ohm
Lead Channel Impedance Value: 437 Ohm
Lead Channel Impedance Value: 513 Ohm
Lead Channel Pacing Threshold Amplitude: 0.5 V
Lead Channel Pacing Threshold Amplitude: 0.75 V
Lead Channel Pacing Threshold Amplitude: 1.375 V
Lead Channel Pacing Threshold Pulse Width: 0.4 ms
Lead Channel Pacing Threshold Pulse Width: 0.4 ms
Lead Channel Pacing Threshold Pulse Width: 0.5 ms
Lead Channel Sensing Intrinsic Amplitude: 1.375 mV
Lead Channel Sensing Intrinsic Amplitude: 1.375 mV
Lead Channel Sensing Intrinsic Amplitude: 9.75 mV
Lead Channel Sensing Intrinsic Amplitude: 9.75 mV
Lead Channel Setting Pacing Amplitude: 1.25 V
Lead Channel Setting Pacing Amplitude: 1.5 V
Lead Channel Setting Pacing Amplitude: 2.75 V
Lead Channel Setting Pacing Pulse Width: 0.4 ms
Lead Channel Setting Pacing Pulse Width: 0.5 ms
Lead Channel Setting Sensing Sensitivity: 0.3 mV

## 2020-07-27 NOTE — Progress Notes (Signed)
Remote ICD transmission.   

## 2020-08-19 ENCOUNTER — Other Ambulatory Visit: Payer: Self-pay | Admitting: Interventional Cardiology

## 2020-09-07 ENCOUNTER — Other Ambulatory Visit: Payer: Self-pay

## 2020-09-07 ENCOUNTER — Ambulatory Visit (INDEPENDENT_AMBULATORY_CARE_PROVIDER_SITE_OTHER): Payer: Medicare Other | Admitting: Internal Medicine

## 2020-09-07 VITALS — BP 198/100 | HR 78 | Ht 61.5 in | Wt 112.2 lb

## 2020-09-07 DIAGNOSIS — I5022 Chronic systolic (congestive) heart failure: Secondary | ICD-10-CM

## 2020-09-07 DIAGNOSIS — Z95810 Presence of automatic (implantable) cardiac defibrillator: Secondary | ICD-10-CM

## 2020-09-07 MED ORDER — ATORVASTATIN CALCIUM 80 MG PO TABS: 80.0000 mg | ORAL_TABLET | Freq: Every day | ORAL | 3 refills | Status: DC

## 2020-09-07 MED ORDER — CARVEDILOL 6.25 MG PO TABS
6.2500 mg | ORAL_TABLET | Freq: Two times a day (BID) | ORAL | 3 refills | Status: DC
Start: 2020-09-07 — End: 2021-10-11

## 2020-09-07 MED ORDER — CLOPIDOGREL BISULFATE 75 MG PO TABS: 75.0000 mg | ORAL_TABLET | Freq: Every day | ORAL | 3 refills | Status: DC

## 2020-09-07 MED ORDER — LOSARTAN POTASSIUM 25 MG PO TABS: 25.0000 mg | ORAL_TABLET | Freq: Every day | ORAL | 3 refills | Status: DC

## 2020-09-07 NOTE — Progress Notes (Signed)
HPI Ms. Jeane returns today for followup of chronic systolic heart failure, s/p ICD insertion. She has a h/o LBB, and tobacco abuse which is now in remission. She has a remote LCX MI with stenting. She has not had any palpitations or ICD therapies. She denies high bp as home. She admits to stopping her meds a couple of weeks ago. She is afraid of kidney failure. She has not had any chest pain or sob. No edema. Allergies  Allergen Reactions   Aldomet [Methyldopa] Other (See Comments)    Severe hypotension   Brilinta [Ticagrelor] Shortness Of Breath   Other Other (See Comments)    Nuclear Stress Test Medication caused seizures   Coumadin [Warfarin Sodium] Swelling    Arm swelled   Eliquis [Apixaban] Other (See Comments)    BP got too low   Lisinopril Other (See Comments)   Penicillins Rash    Has patient had a PCN reaction causing immediate rash, facial/tongue/throat swelling, SOB or lightheadedness with hypotension: Yes Has patient had a PCN reaction causing severe rash involving mucus membranes or skin necrosis: No Has patient had a PCN reaction that required hospitalization: No Has patient had a PCN reaction occurring within the last 10 years: No If all of the above answers are "NO", then may proceed with Cephalosporin use.      Current Outpatient Medications  Medication Sig Dispense Refill   acetaminophen (TYLENOL) 500 MG tablet Take 1,000 mg by mouth every 6 (six) hours as needed for headache (pain).     aspirin EC 81 MG tablet Take 81 mg by mouth daily.     atorvastatin (LIPITOR) 80 MG tablet TAKE 1 TABLET BY MOUTH DAILY 90 tablet 2   Bempedoic Acid (NEXLETOL) 180 MG TABS Take 180 mg by mouth daily. 90 tablet 2   clopidogrel (PLAVIX) 75 MG tablet TAKE 1 TABLET BY MOUTH DAILY WITH BREAKFAST. Please make overdue appt WITH Dr. Eldridge Dace BEFORE anymore refills. 90 tablet 3   furosemide (LASIX) 40 MG tablet Take 1 tablet (40 mg total) by mouth daily as needed for fluid or edema  (shortness of breath). 90 tablet 3   lidocaine (LIDODERM) 5 % Place 1 patch onto the skin daily as needed. Apply patch to area most significant pain once per day.  Remove and discard patch within 12 hours of application. 30 patch 0   losartan (COZAAR) 25 MG tablet TAKE 1/2 TABLET BY MOUTH DAILY 45 tablet 2   methocarbamol (ROBAXIN) 500 MG tablet Take 1 tablet (500 mg total) by mouth every 8 (eight) hours as needed for muscle spasms. 15 tablet 0   nicotine (NICODERM CQ) 7 mg/24hr patch Place 1 patch (7 mg total) onto the skin daily. 28 patch 0   pantoprazole (PROTONIX) 40 MG tablet Take 1 tablet (40 mg total) by mouth daily. Future refills need to be from PCP! 30 tablet 1   potassium chloride (KLOR-CON) 10 MEQ tablet Take 2 tablets (20 mEq total) by mouth daily. 20 tablet 0   No current facility-administered medications for this visit.     Past Medical History:  Diagnosis Date   Acute myocardial infarction of other lateral wall, initial episode of care 01/2013   DES CFX   Aortoiliac occlusive disease (HCC) 08/22/2016   CAD (coronary artery disease) 11/13/2011   50% ? proximal LCx stenosis per Cath at [facility]   Chest pain, atypical, muscular skelatal   01/09/2014   COVID 02/08/2020   Dyspnea  Heart failure (HCC)    HLD (hyperlipidemia)    Hypertension    Intermediate coronary syndrome (HCC)    LBBB (left bundle branch block)    Mitral regurgitation    Nonischemic cardiomyopathy (HCC) 01/26/2017   PVD (peripheral vascular disease) (HCC)    Right CFA stenosis per report on 11/14/2011   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/25/2016   Edwards McArthy-Adams IMR ETLogix ring annuloplasty (Model 4100, Serial [id number], size 26) placed via right mini thoracotomy approach   Stenosis of left subclavian artery (HCC) 08/22/2016   Stroke (HCC)    tia with no deficits    ROS:   All systems reviewed and negative except as noted in the HPI.   Past Surgical History:  Procedure Laterality Date    APPENDECTOMY     BIV ICD INSERTION CRT-D N/A 01/24/2017   Procedure: BIV ICD INSERTION CRT-D;  Surgeon: Marinus Maw, MD;  Location: [facility] INVASIVE CV LAB;  Service: Cardiovascular;  Laterality: N/A;   CARDIAC CATHETERIZATION  04/14/2013   Non-obstructive disease, patent CFX stent   CARDIAC CATHETERIZATION N/A 09/21/2014   Procedure: Left Heart Cath and Coronary Angiography;  Surgeon: Lennette Bihari, MD;  Location: [facility] INVASIVE CV LAB;  Service: Cardiovascular;  Laterality: N/A;   CORONARY ANGIOPLASTY WITH STENT PLACEMENT  01/17/2013   mild disease except 99% CFX, rx with  2.5 x 28 Alpine drug-eluting stent    GROIN DEBRIDEMENT Right 04/14/2013   Procedure: Emergency Evacuation of Retroperitoneal Hematoma and Repair Right External Iliac Artery Pseudoaneurysm    ;  Surgeon: Sherren Kerns, MD;  Location: [facility] OR;  Service: Vascular;  Laterality: Right;   LEFT HEART CATHETERIZATION WITH CORONARY ANGIOGRAM N/A 01/18/2013   Procedure: LEFT HEART CATHETERIZATION WITH CORONARY ANGIOGRAM;  Surgeon: Corky Crafts, MD;  Location: [facility] CATH LAB;  Service: Cardiovascular;  Laterality: N/A;   LEFT HEART CATHETERIZATION WITH CORONARY ANGIOGRAM N/A 04/14/2013   Procedure: LEFT HEART CATHETERIZATION WITH CORONARY ANGIOGRAM;  Surgeon: Corky Crafts, MD;  Location: [facility] CATH LAB;  Service: Cardiovascular;  Laterality: N/A;   MITRAL VALVE REPAIR Right 08/25/2016   Procedure: MINIMALLY INVASIVE MITRAL VALVE REPAIR;  Surgeon: Purcell Nails, MD;  Location: [facility] OR;  Service: Open Heart Surgery;  Laterality: Right;   MULTIPLE EXTRACTIONS WITH ALVEOLOPLASTY N/A 08/23/2016   Procedure: Extraction of tooth #'s 17, 20-23, 26-29 with alveoloplasty;  Surgeon: [name], DDS;  Location: [facility] OR;  Service: Oral Surgery;  Laterality: N/A;   PERCUTANEOUS CORONARY STENT INTERVENTION (PCI-S)  01/18/2013   Procedure: PERCUTANEOUS CORONARY STENT INTERVENTION (PCI-S);  Surgeon: Corky Crafts, MD;  Location: [facility] CATH  LAB;  Service: Cardiovascular;;   RIGHT/LEFT HEART CATH AND CORONARY ANGIOGRAPHY N/A 08/21/2016   Procedure: RIGHT/LEFT HEART CATH AND CORONARY ANGIOGRAPHY;  Surgeon: Kathleene Hazel, MD;  Location: [facility] INVASIVE CV LAB;  Service: Cardiovascular;  Laterality: N/A;   TEE WITHOUT CARDIOVERSION N/A 03/06/2014   Procedure: TRANSESOPHAGEAL ECHOCARDIOGRAM (TEE);  Surgeon: Lars Masson, MD;  Location: [facility] ENDOSCOPY;  Service: Cardiovascular;  Laterality: N/A;   TEE WITHOUT CARDIOVERSION N/A 08/18/2016   Procedure: TRANSESOPHAGEAL ECHOCARDIOGRAM (TEE);  Surgeon: Lewayne Bunting, MD;  Location: [facility] ENDOSCOPY;  Service: Cardiovascular;  Laterality: N/A;   TEE WITHOUT CARDIOVERSION N/A 08/25/2016   Procedure: TRANSESOPHAGEAL ECHOCARDIOGRAM (TEE);  Surgeon: Purcell Nails, MD;  Location: [facility] OR;  Service: Open Heart Surgery;  Laterality: N/A;   TUBAL LIGATION       Family History  Problem Relation Age of  Onset   CAD Mother 64   Lung cancer Mother    Bladder Cancer Mother    Stroke Mother    Heart disease Mother        Before age 19   Hypertension Mother    Heart attack Mother    CAD Father 17   Heart disease Father        Before age 41   Heart attack Father    Stroke Father        Bleeding stroke     Social History   Socioeconomic History   Marital status: Single    Spouse name: Not on file   Number of children: Not on file   Years of education: Not on file   Highest education level: Not on file  Occupational History   Not on file  Tobacco Use   Smoking status: Every Day    Packs/day: 0.50    Types: Cigarettes   Smokeless tobacco: Never  Vaping Use   Vaping Use: Never used  Substance and Sexual Activity   Alcohol use: No    Alcohol/week: 0.0 standard drinks   Drug use: Yes    Types: Marijuana   Sexual activity: Yes    Birth control/protection: None  Other Topics Concern   Not on file  Social History Narrative   Not on file   Social Determinants of Health    Financial Resource Strain: Not on file  Food Insecurity: Not on file  Transportation Needs: Not on file  Physical Activity: Not on file  Stress: Not on file  Social Connections: Not on file  Intimate Partner Violence: Not on file     BP (!) 198/100   Pulse 78   Ht 5' 1.5" (1.562 m)   Wt 112 lb 3.2 oz (50.9 kg)   SpO2 98%   BMI 20.86 kg/m   Physical Exam:  Well appearing NAD HEENT: Unremarkable Neck:  No JVD, no thyromegally Lymphatics:  No adenopathy Back:  No CVA tenderness Lungs:  Clear with no wheeze HEART:  Regular rate rhythm, no murmurs, no rubs, no clicks Abd:  soft, positive bowel sounds, no organomegally, no rebound, no guarding Ext:  2 plus pulses, no edema, no cyanosis, no clubbing Skin:  No rashes no nodules Neuro:  CN II through XII intact, motor grossly intact   DEVICE  Normal device function.  See PaceArt for details.   Assess/Plan:  1. Chronic systolic heart failure - her symptoms remain class 2. SHe will continue her current meds. 2. ICD - her medtronic biv ICD is working normally. We will recheck in several months. 3. HTN - her bp is high in the office but she has been non-compliant. She is encouraged to avoid salty foods and to restart her meds. 4. Peripheral vascular disease - she is over 3 years in remission of her smoking. She denies claudication.    Leonia Reeves.D.

## 2020-09-07 NOTE — Patient Instructions (Addendum)
Medication Instructions:  Your physician has recommended you make the following change in your medication:    Carvedilol 6.25 mg-  Take one tablet by mouth twice a day  2.  Losartan 25 mg-  Take one tablet by mouth daily  Labwork: You will get lab work today:   BMP and liver panel  Testing/Procedures: None ordered.  Follow-Up: Your physician wants you to follow-up in: one year with Lewayne Bunting, MD or one of the following Advanced Practice Providers on your designated Care Team:   Francis Dowse, [place] Casimiro Needle "Mardelle Matte" Lanna Poche, [place]  Remote monitoring is used to monitor your ICD from home. This monitoring reduces the number of office visits required to check your device to one time per year. It allows [place] to keep an eye on the functioning of your device to ensure it is working properly. You are scheduled for a device check from home on 10/05/2020. You may send your transmission at any time that day. If you have a wireless device, the transmission will be sent automatically. After your physician reviews your transmission, you will receive a postcard with your next transmission date.  Any Other Special Instructions Will Be Listed Below (If Applicable).  If you need a refill on your cardiac medications before your next appointment, please call your pharmacy.

## 2020-09-08 LAB — HEPATIC FUNCTION PANEL
ALT: 11 IU/L (ref 0–32)
AST: 19 IU/L (ref 0–40)
Albumin: 4.3 g/dL (ref 3.8–4.9)
Alkaline Phosphatase: 108 IU/L (ref 44–121)
Bilirubin Total: 0.8 mg/dL (ref 0.0–1.2)
Bilirubin, Direct: 0.16 mg/dL (ref 0.00–0.40)
Total Protein: 7 g/dL (ref 6.0–8.5)

## 2020-09-08 LAB — BASIC METABOLIC PANEL
BUN/Creatinine Ratio: 11 (ref 9–23)
BUN: 9 mg/dL (ref 6–24)
CO2: 24 mmol/L (ref 20–29)
Calcium: 9 mg/dL (ref 8.7–10.2)
Chloride: 105 mmol/L (ref 96–106)
Creatinine, Ser: 0.85 mg/dL (ref 0.57–1.00)
Glucose: 80 mg/dL (ref 65–99)
Potassium: 4 mmol/L (ref 3.5–5.2)
Sodium: 144 mmol/L (ref 134–144)
eGFR: 81 mL/min/{1.73_m2} (ref >59)

## 2020-09-09 ENCOUNTER — Telehealth: Payer: Self-pay

## 2020-09-09 NOTE — Telephone Encounter (Signed)
Patient notified and verbalized understanding. Pt had no questions or concerns at this time 

## 2020-09-09 NOTE — Telephone Encounter (Signed)
-----   Message from Marinus Maw, MD sent at 09/09/2020  9:54 AM EDT ----- Blood work is unremarkable.

## 2020-09-10 ENCOUNTER — Telehealth: Payer: Self-pay | Admitting: Internal Medicine

## 2020-09-10 MED ORDER — POTASSIUM CHLORIDE ER 10 MEQ PO TBCR: 20.0000 meq | EXTENDED_RELEASE_TABLET | Freq: Every day | ORAL | 3 refills | Status: DC

## 2020-09-10 MED ORDER — CLOPIDOGREL BISULFATE 75 MG PO TABS: 75.0000 mg | ORAL_TABLET | Freq: Every day | ORAL | 3 refills | Status: DC

## 2020-09-10 MED ORDER — LOSARTAN POTASSIUM 25 MG PO TABS: 12.5000 mg | ORAL_TABLET | Freq: Every day | ORAL | 3 refills | Status: DC

## 2020-09-10 NOTE — Telephone Encounter (Signed)
Discussed with Dr. Ladona Ridgel.  Will reduce losartan to previously tolerated dose:  losartan 25 mg 1/2 tablet by mouth daily.  Pt aware and agreeable.  Pt overdue for follow up with Dr. Eldridge Dace.

## 2020-09-10 NOTE — Telephone Encounter (Signed)
Pt c/o medication issue:  1. Name of Medication: losartan (COZAAR) 25 MG tablet  2. How are you currently taking this medication (dosage and times per day)? Take 1 tablet (25 mg total) by mouth daily.  3. Are you having a reaction (difficulty breathing--STAT)? yes  4. What is your medication issue?  pts medication was changed because her blood pressure was high, pt took new medication today and now bp is extremely low.

## 2020-10-05 ENCOUNTER — Ambulatory Visit (INDEPENDENT_AMBULATORY_CARE_PROVIDER_SITE_OTHER): Payer: Medicare Other

## 2020-10-05 DIAGNOSIS — I428 Other cardiomyopathies: Secondary | ICD-10-CM

## 2020-10-05 LAB — CUP PACEART REMOTE DEVICE CHECK
Battery Remaining Longevity: 21 mo
Battery Voltage: 2.94 V
Brady Statistic AP VP Percent: 0.06 %
Brady Statistic AP VS Percent: 0.01 %
Brady Statistic AS VP Percent: 99.87 %
Brady Statistic AS VS Percent: 0.07 %
Brady Statistic RA Percent Paced: 0.07 %
Brady Statistic RV Percent Paced: 99.85 %
Date Time Interrogation Session: 20221004082503
HighPow Impedance: 70 Ohm
Implantable Lead Implant Date: 20190124
Implantable Lead Implant Date: 20190124
Implantable Lead Implant Date: 20190124
Implantable Lead Location: 753859
Implantable Lead Location: 753860
Implantable Lead Location: 753860
Implantable Lead Model: 3830
Implantable Lead Model: 5076
Implantable Lead Model: 6935
Implantable Pulse Generator Implant Date: 20190124
Lead Channel Impedance Value: 209 Ohm
Lead Channel Impedance Value: 304 Ohm
Lead Channel Impedance Value: 380 Ohm
Lead Channel Impedance Value: 437 Ohm
Lead Channel Impedance Value: 437 Ohm
Lead Channel Impedance Value: 532 Ohm
Lead Channel Pacing Threshold Amplitude: 0.5 V
Lead Channel Pacing Threshold Amplitude: 0.75 V
Lead Channel Pacing Threshold Amplitude: 1 V
Lead Channel Pacing Threshold Pulse Width: 0.4 ms
Lead Channel Pacing Threshold Pulse Width: 0.4 ms
Lead Channel Pacing Threshold Pulse Width: 0.5 ms
Lead Channel Sensing Intrinsic Amplitude: 0.625 mV
Lead Channel Sensing Intrinsic Amplitude: 0.625 mV
Lead Channel Sensing Intrinsic Amplitude: 8.5 mV
Lead Channel Sensing Intrinsic Amplitude: 8.5 mV
Lead Channel Setting Pacing Amplitude: 1.25 V
Lead Channel Setting Pacing Amplitude: 1.5 V
Lead Channel Setting Pacing Amplitude: 2.5 V
Lead Channel Setting Pacing Pulse Width: 0.4 ms
Lead Channel Setting Pacing Pulse Width: 0.5 ms
Lead Channel Setting Sensing Sensitivity: 0.3 mV

## 2020-10-13 NOTE — Progress Notes (Signed)
Remote ICD transmission.   

## 2020-11-16 NOTE — Telephone Encounter (Signed)
**Note De-Identified Camille Thau Obfuscation** Maisee Yetta Barre KeyPara Skeans - PA Case ID: [id number] - Rx #: [id number] Outcome: Approved on February 15 NEXLETOL TAB 180MG  is approved through 01/01/2021. Your patient may now fill this prescription and it will be covered. Drug: Nexletol 180MG  tablets Form: [employer] Medicare Part D Electronic Prior Authorization Form (2017 NCPDP) Original Claim Info [phone] Provide Exception Process Printed NoticeTransition max 30 day unless unbreakableNon-Form, Dr. 10-22-2004 ePA at [email or web address] Transition Per Fill Days Supply Maxof 30 exceededFor CMS Transition, resubmit with remaining days supply of 30 or less.ROSUVASTATIN;EZETIMIBE/SIMVASTATIN 03,704:

## 2020-11-28 ENCOUNTER — Emergency Department (HOSPITAL_COMMUNITY): Payer: Medicare Other

## 2020-11-28 ENCOUNTER — Other Ambulatory Visit: Payer: Self-pay

## 2020-11-28 ENCOUNTER — Encounter (HOSPITAL_COMMUNITY): Payer: Self-pay | Admitting: Emergency Medicine

## 2020-11-28 ENCOUNTER — Emergency Department (HOSPITAL_COMMUNITY)
Admission: EM | Admit: 2020-11-28 | Discharge: 2020-11-28 | Disposition: A | Discharge status: Home / Self Care | Payer: Medicare Other | Attending: Emergency Medicine | Admitting: Emergency Medicine

## 2020-11-28 DIAGNOSIS — Z79899 Other long term (current) drug therapy: Secondary | ICD-10-CM | POA: Diagnosis not present

## 2020-11-28 DIAGNOSIS — I5042 Chronic combined systolic (congestive) and diastolic (congestive) heart failure: Secondary | ICD-10-CM | POA: Insufficient documentation

## 2020-11-28 DIAGNOSIS — Z7902 Long term (current) use of antithrombotics/antiplatelets: Secondary | ICD-10-CM | POA: Diagnosis not present

## 2020-11-28 DIAGNOSIS — Z8616 Personal history of COVID-19: Secondary | ICD-10-CM | POA: Diagnosis not present

## 2020-11-28 DIAGNOSIS — F17210 Nicotine dependence, cigarettes, uncomplicated: Secondary | ICD-10-CM | POA: Diagnosis not present

## 2020-11-28 DIAGNOSIS — I2510 Atherosclerotic heart disease of native coronary artery without angina pectoris: Secondary | ICD-10-CM | POA: Insufficient documentation

## 2020-11-28 DIAGNOSIS — J101 Influenza due to other identified influenza virus with other respiratory manifestations: Secondary | ICD-10-CM | POA: Insufficient documentation

## 2020-11-28 DIAGNOSIS — R0602 Shortness of breath: Secondary | ICD-10-CM | POA: Diagnosis present

## 2020-11-28 DIAGNOSIS — I110 Hypertensive heart disease with heart failure: Secondary | ICD-10-CM | POA: Diagnosis not present

## 2020-11-28 DIAGNOSIS — Z20822 Contact with and (suspected) exposure to covid-19: Secondary | ICD-10-CM | POA: Diagnosis not present

## 2020-11-28 DIAGNOSIS — Z7982 Long term (current) use of aspirin: Secondary | ICD-10-CM | POA: Diagnosis not present

## 2020-11-28 LAB — BASIC METABOLIC PANEL
Anion gap: 12 (ref 5–15)
BUN: 10 mg/dL (ref 6–20)
CO2: 26 mmol/L (ref 22–32)
Calcium: 9.1 mg/dL (ref 8.9–10.3)
Chloride: 97 mmol/L — ABNORMAL LOW (ref 98–111)
Creatinine, Ser: 1.04 mg/dL — ABNORMAL HIGH (ref 0.44–1.00)
GFR, Estimated: >60 mL/min (ref >60)
Glucose, Bld: 100 mg/dL — ABNORMAL HIGH (ref 70–99)
Potassium: 4.1 mmol/L (ref 3.5–5.1)
Sodium: 135 mmol/L (ref 135–145)

## 2020-11-28 LAB — CBC WITH DIFFERENTIAL/PLATELET
Abs Immature Granulocytes: 0.02 10*3/uL (ref 0.00–0.07)
Basophils Absolute: 0 10*3/uL (ref 0.0–0.1)
Basophils Relative: 1 %
Eosinophils Absolute: 0.1 10*3/uL (ref 0.0–0.5)
Eosinophils Relative: 2 %
HCT: 55 % — ABNORMAL HIGH (ref 36.0–46.0)
Hemoglobin: 17.6 g/dL — ABNORMAL HIGH (ref 12.0–15.0)
Immature Granulocytes: 0 %
Lymphocytes Relative: 32 %
Lymphs Abs: 1.9 10*3/uL (ref 0.7–4.0)
MCH: 28.3 pg (ref 26.0–34.0)
MCHC: 32 g/dL (ref 30.0–36.0)
MCV: 88.4 fL (ref 80.0–100.0)
Monocytes Absolute: 1.1 10*3/uL — ABNORMAL HIGH (ref 0.1–1.0)
Monocytes Relative: 18 %
Neutro Abs: 2.8 10*3/uL (ref 1.7–7.7)
Neutrophils Relative %: 47 %
Platelets: 193 10*3/uL (ref 150–400)
RBC: 6.22 MIL/uL — ABNORMAL HIGH (ref 3.87–5.11)
RDW: 14.8 % (ref 11.5–15.5)
WBC: 5.8 10*3/uL (ref 4.0–10.5)
nRBC: 0 % (ref 0.0–0.2)

## 2020-11-28 LAB — TROPONIN I (HIGH SENSITIVITY)
Troponin I (High Sensitivity): 21 ng/L — ABNORMAL HIGH (ref <18)
Troponin I (High Sensitivity): 19 ng/L — ABNORMAL HIGH (ref <18)

## 2020-11-28 LAB — RESP PANEL BY RT-PCR (FLU A&B, COVID) ARPGX2
Influenza A by PCR: POSITIVE — AB
Influenza B by PCR: NEGATIVE
SARS Coronavirus 2 by RT PCR: NEGATIVE

## 2020-11-28 LAB — BRAIN NATRIURETIC PEPTIDE: B Natriuretic Peptide: 130.9 pg/mL — ABNORMAL HIGH (ref 0.0–100.0)

## 2020-11-28 MED ORDER — FUROSEMIDE 10 MG/ML IJ SOLN
20.0000 mg | Freq: Once | INTRAMUSCULAR | Status: AC
  Administered 2020-11-28: 13:00:00 20 mg via INTRAVENOUS
  Filled 2020-11-28: qty 2

## 2020-11-28 MED ORDER — HYDROXYZINE HCL 25 MG PO TABS: 25.0000 mg | ORAL_TABLET | Freq: Three times a day (TID) | ORAL | 0 refills | Status: DC | PRN

## 2020-11-28 MED ORDER — LORAZEPAM 2 MG/ML IJ SOLN
1.0000 mg | Freq: Once | INTRAMUSCULAR | Status: DC
  Filled 2020-11-28: qty 1

## 2020-11-28 MED ORDER — HYDROXYZINE HCL 25 MG PO TABS
25.0000 mg | ORAL_TABLET | Freq: Once | ORAL | Status: AC
  Administered 2020-11-28: 10:00:00 25 mg via ORAL
  Filled 2020-11-28: qty 1

## 2020-11-28 MED ORDER — ALBUTEROL SULFATE HFA 108 (90 BASE) MCG/ACT IN AERS
2.0000 | INHALATION_SPRAY | Freq: Once | RESPIRATORY_TRACT | Status: AC
  Administered 2020-11-28: 13:00:00 2 via RESPIRATORY_TRACT
  Filled 2020-11-28: qty 6.7

## 2020-11-28 MED ORDER — ALBUTEROL SULFATE HFA 108 (90 BASE) MCG/ACT IN AERS: 2.0000 | INHALATION_SPRAY | RESPIRATORY_TRACT | 0 refills | Status: DC | PRN

## 2020-11-28 NOTE — Discharge Instructions (Addendum)
You were diagnosed with the flu today.  This is very likely causing your symptoms.  Most people have symptoms from the flu for 5 to 7 days.  You can take over-the-counter flu and cold medications as needed for headaches, fevers, coughing.  I prescribed you an albuterol inhaler which may help with your shortness of breath.  I also prescribed an anxiety medicine called Vistaril which you can take up to 3 times a day as needed for anxiety issues.  Your work-up today did not show that you are needing oxygen, and we do not see signs of bacterial pneumonia, heart failure, anemia, or other life-threatening causes of shortness of breath.  Please schedule follow-up appoint with your primary care doctor in the office.

## 2020-11-28 NOTE — ED Provider Notes (Signed)
Emergency Medicine Provider Triage Evaluation Note  Lindsay Camacho , a 55 y.o. female  was evaluated in triage.  Pt complains of sob.  Review of Systems  Positive: Sob, chest pain, can't catch breath, anxious Negative: Fever, productive cough, body aches  Physical Exam  BP (!) 124/99 (BP Location: Right Arm)   Pulse 85   Resp (!) 22   SpO2 98%  Gen:   Awake, yelling "I can't breath" hyperventilating Resp:  tachypneic MSK:   Moves extremities without difficulty  Other:    Medical Decision Making  Medically screening exam initiated at 9:53 AM.  Appropriate orders placed.  [name] was informed that the remainder of the evaluation will be completed by another provider, this initial triage assessment does not replace that evaluation, and the importance of remaining in the ED until their evaluation is complete.  Hx of CHF, copd, tobacco abuse here with chest pain and sob since last discharge 11/24th.  States albuterol makes her sxs worse. Is hyperventilating, O2 of 98% on RA.    Fayrene Helper, PA-C 11/28/20 5726    Gloris Manchester, MD 11/30/20 [phone]

## 2020-11-28 NOTE — ED Provider Notes (Signed)
[facility] EMERGENCY DEPARTMENT Provider Note   CSN: [id number] Arrival date & time: 11/28/20  6767     History Chief Complaint  Patient presents with   Shortness of Breath    Lindsay Camacho is a 55 y.o. female w/ hx of CAD, LBBB, CHF s/p ICD, presenting to the ED with shortness of breath.  Patient is extremely upset in the Ed, hyperventilating, shouting at staff, and having a difficult time providing a reliable history to me.  She reports she just left a hospital for "heart failure," and has been feeling short of breath at home for a week.  She reports she is prescribed lasix but it's not clear if she's taking it.  She states she hasn't smoked "recently."  She reports nausea, finger tingling, lightheadedness, and dyspnea.  She uses an inhaler at home with little relief.  HPI     Past Medical History:  Diagnosis Date   Acute myocardial infarction of other lateral wall, initial episode of care 01/2013   DES CFX   Aortoiliac occlusive disease (HCC) 08/22/2016   CAD (coronary artery disease) 11/13/2011   50% ? 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/25/2016   Edwards McArthy-Adams IMR ETLogix ring annuloplasty (Model 4100, Serial [id number], size 26) placed via right mini thoracotomy approach   Stenosis of left subclavian artery (HCC) 08/22/2016   Stroke (HCC)    tia with no deficits    Patient Active Problem List   Diagnosis Date Noted   Chest pain, rule out acute myocardial infarction 07/22/2018   ICD (implantable  cardioverter-defibrillator) in place 01/26/2017   Nonischemic cardiomyopathy (HCC) 01/26/2017   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/25/2016   Stenosis of left subclavian artery (HCC) 08/22/2016   Aortoiliac occlusive disease (HCC) 08/22/2016   Acute on chronic systolic CHF (congestive heart failure) (HCC)    Mitral regurgitation 08/17/2016   Heart failure (HCC) 08/17/2016   Shortness of breath    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   Diarrhea 10/05/2014   Syncope 03/09/2014   Chronic combined systolic (congestive) and diastolic (congestive) heart failure (HCC) 01/12/2014   Chest pain, atypical, muscular skelatal   01/09/2014   Diastolic CHF, acute (HCC) 01/08/2014   Chest pain at rest 01/08/2014   Congestive heart disease (HCC)    Post-op pain-Right groin area 05/27/2013   Drainage from wound-Groin area 05/27/2013   Retroperitoneal hematoma 05/01/2013   Retroperitoneal bleed 04/15/2013   Anemia due to blood loss 04/15/2013   Hyperlipidemia with target LDL less than 70 04/15/2013   Intermediate coronary syndrome (HCC)    Chest pain 04/11/2013   CAD - CFX DES 01/17/13 01/20/2013   Rash post PCI 01/20/2013   NSTEMI (non-ST elevated myocardial infarction) (HCC) 01/17/2013   Chest pain, musculoskeletal 10/07/2012   HTN (hypertension) 10/07/2012   LBBB (left bundle branch block) 10/07/2012   Tobacco abuse 10/07/2012    Past Surgical History:  Procedure Laterality Date   APPENDECTOMY     BIV ICD INSERTION  CRT-D N/A 01/24/2017   Procedure: BIV ICD INSERTION CRT-D;  Surgeon: Evans Lance, MD;  Location: [place] CV [facility];  Service: Cardiovascular;  Laterality: N/A;   CARDIAC CATHETERIZATION  04/14/2013   Non-obstructive disease, patent CFX stent   CARDIAC CATHETERIZATION N/A 09/21/2014   Procedure: Left Heart Cath and Coronary Angiography;  Surgeon: Troy Sine, MD;  Location: [place] [facility];  Service: Cardiovascular;  Laterality: N/A;   CORONARY ANGIOPLASTY  WITH STENT PLACEMENT  01/17/2013   mild disease except 99% CFX, rx with  2.5 x 28 Alpine drug-eluting stent    GROIN DEBRIDEMENT Right 04/14/2013   Procedure: Emergency Evacuation of Retroperitoneal Hematoma and Repair Right External Iliac Artery Pseudoaneurysm    ;  Surgeon: Elam Dutch, MD;  Location: [place];  Service: Vascular;  Laterality: Right;   LEFT HEART CATHETERIZATION WITH CORONARY ANGIOGRAM N/A 01/18/2013   Procedure: LEFT HEART CATHETERIZATION WITH CORONARY ANGIOGRAM;  Surgeon: Jettie Booze, MD;  Location: [facility] CATH LAB;  Service: Cardiovascular;  Laterality: N/A;   LEFT HEART CATHETERIZATION WITH CORONARY ANGIOGRAM N/A 04/14/2013   Procedure: LEFT HEART CATHETERIZATION WITH CORONARY ANGIOGRAM;  Surgeon: Jettie Booze, MD;  Location: [facility] CATH LAB;  Service: Cardiovascular;  Laterality: N/A;   MITRAL VALVE REPAIR Right 08/25/2016   Procedure: MINIMALLY INVASIVE MITRAL VALVE REPAIR;  Surgeon: Rexene Alberts, MD;  Location: [place];  Service: Open Heart Surgery;  Laterality: Right;   MULTIPLE EXTRACTIONS WITH ALVEOLOPLASTY N/A 08/23/2016   Procedure: Extraction of tooth #'s 17, 20-23, 26-29 with alveoloplasty;  Surgeon: Lenn Cal, DDS;  Location: [place];  Service: Oral Surgery;  Laterality: N/A;   PERCUTANEOUS CORONARY STENT INTERVENTION (PCI-S)  01/18/2013   Procedure: PERCUTANEOUS CORONARY STENT INTERVENTION (PCI-S);  Surgeon: Jettie Booze, MD;  Location: [facility] CATH LAB;  Service: Cardiovascular;;   RIGHT/LEFT HEART CATH AND CORONARY ANGIOGRAPHY N/A 08/21/2016   Procedure: RIGHT/LEFT HEART CATH AND CORONARY ANGIOGRAPHY;  Surgeon: Burnell Blanks, MD;  Location: [place] CV LAB;  Service: Cardiovascular;  Laterality: N/A;   TEE WITHOUT CARDIOVERSION N/A 03/06/2014   Procedure: TRANSESOPHAGEAL ECHOCARDIOGRAM (TEE);  Surgeon: Dorothy Spark, MD;  Location: [place];  Service: Cardiovascular;  Laterality: N/A;   TEE WITHOUT CARDIOVERSION N/A 08/18/2016    Procedure: TRANSESOPHAGEAL ECHOCARDIOGRAM (TEE);  Surgeon: Lelon Perla, MD;  Location: [facility] ENDOSCOPY;  Service: Cardiovascular;  Laterality: N/A;   TEE WITHOUT CARDIOVERSION N/A 08/25/2016   Procedure: TRANSESOPHAGEAL ECHOCARDIOGRAM (TEE);  Surgeon: Rexene Alberts, MD;  Location: [place];  Service: Open Heart Surgery;  Laterality: N/A;   TUBAL LIGATION       OB History   No obstetric history on file.     Family History  Problem Relation Age of Onset   CAD Mother 27   Lung cancer Mother    Bladder Cancer Mother    Stroke Mother    Heart disease Mother        Before age 72   Hypertension Mother    Heart attack Mother    CAD Father 33   Heart disease Father        Before age 6   Heart attack Father    Stroke Father        Bleeding stroke    Social History   Tobacco Use   Smoking status: Every Day    Packs/day: 0.50    Types: Cigarettes   Smokeless tobacco: Never  Vaping Use   Vaping Use: Never used  Substance Use Topics   Alcohol use: No    Alcohol/week: 0.0 standard drinks   Drug use: Yes    Types: Marijuana    Home Medications Prior to Admission medications   Medication Sig Start Date End Date Taking? Authorizing Provider  acetaminophen (TYLENOL) 500 MG tablet Take 1,000 mg by mouth every 6 (six) hours as needed for headache (pain).   Yes [provider]  albuterol (VENTOLIN HFA) 108 (90 Base) MCG/ACT inhaler Inhale into the lungs every 6 (six) hours as needed for wheezing or shortness of breath.   Yes [provider]  albuterol (VENTOLIN HFA) 108 (90 Base) MCG/ACT inhaler Inhale 2 puffs into the lungs every 4 (four) hours as needed for wheezing or shortness of breath. 11/28/20  Yes Wyvonnia Dusky, MD  aspirin EC 81 MG tablet Take 81 mg by mouth daily.   Yes [provider]  atorvastatin (LIPITOR) 80 MG tablet Take 1 tablet (80 mg total) by mouth daily. 09/07/20  Yes Evans Lance, MD  Bempedoic Acid (NEXLETOL) 180 MG TABS Take  180 mg by mouth daily. 02/11/20  Yes Jettie Booze, MD  carvedilol (COREG) 6.25 MG tablet Take 1 tablet (6.25 mg total) by mouth 2 (two) times daily. 09/07/20  Yes Evans Lance, MD  clopidogrel (PLAVIX) 75 MG tablet Take 1 tablet (75 mg total) by mouth daily. 09/10/20  Yes Evans Lance, MD  ergocalciferol (VITAMIN D2) 1.25 MG (50000 UT) capsule Take 50,000 Units by mouth once a week.   Yes [provider]  furosemide (LASIX) 40 MG tablet Take 1 tablet (40 mg total) by mouth daily as needed for fluid or edema (shortness of breath). 03/05/20  Yes Shirley Friar, PA-C  hydrOXYzine (ATARAX/VISTARIL) 25 MG tablet Take 1 tablet (25 mg total) by mouth every 8 (eight) hours as needed for up to 21 doses for anxiety. 11/28/20  Yes Wyvonnia Dusky, MD  losartan (COZAAR) 25 MG tablet Take 0.5 tablets (12.5 mg total) by mouth daily. 09/10/20 12/09/20 Yes Evans Lance, MD  methocarbamol (ROBAXIN) 500 MG tablet Take 1 tablet (500 mg total) by mouth every 8 (eight) hours as needed for muscle spasms. 02/27/20  Yes Petrucelli, Samantha R, PA-C  nicotine (NICODERM CQ) 7 mg/24hr patch Place 1 patch (7 mg total) onto the skin daily. 10/14/19  Yes Evans Lance, MD  pantoprazole (PROTONIX) 40 MG tablet Take 1 tablet (40 mg total) by mouth daily. Future refills need to be from PCP! 03/05/20  Yes Tillery, Satira Mccallum, PA-C  potassium chloride (KLOR-CON) 10 MEQ tablet Take 2 tablets (20 mEq total) by mouth daily. 09/10/20  Yes Evans Lance, MD  spironolactone (ALDACTONE) 25 MG tablet Take 25 mg by mouth daily.   Yes [provider]  lidocaine (LIDODERM) 5 % Place 1 patch onto the skin daily as needed. Apply patch to area most significant pain once per day.  Remove and discard patch within 12 hours of application. Patient not taking: Reported on 11/28/2020 02/27/20   Petrucelli, Aldona Bar R, PA-C    Allergies    Aldomet [methyldopa], Brilinta [ticagrelor], Other, Coumadin [warfarin sodium],  Eliquis [apixaban], Lisinopril, and Penicillins  Review of Systems   Review of Systems  Constitutional:  Positive for chills. Negative for fever.  HENT:  Negative for ear pain and sore throat.   Eyes:  Negative for pain and visual disturbance.  Respiratory:  Positive for cough and shortness of breath.   Cardiovascular:  Negative for  chest pain and palpitations.  Gastrointestinal:  Positive for nausea. Negative for abdominal pain and vomiting.  Genitourinary:  Negative for dysuria and hematuria.  Musculoskeletal:  Positive for arthralgias and myalgias.  Skin:  Negative for color change and rash.  Neurological:  Positive for light-headedness and headaches. Negative for syncope.  All other systems reviewed and are negative.  Physical Exam Updated Vital Signs BP 113/69 (BP Location: Right Arm)   Pulse 82   Temp 97.7 F (36.5 C) (Oral)   Resp 14   Ht 5' 1.25" (1.556 m)   Wt 46.7 kg   SpO2 98%   BMI 19.30 kg/m   Physical Exam Constitutional:      General: She is not in acute distress.    Comments: Sobbing, curled on the bed, hyperventilating  HENT:     Head: Normocephalic and atraumatic.  Eyes:     Conjunctiva/sclera: Conjunctivae normal.     Pupils: Pupils are equal, round, and reactive to light.  Cardiovascular:     Rate and Rhythm: Normal rate and regular rhythm.  Pulmonary:     Effort: Pulmonary effort is normal. No respiratory distress.     Comments: 99% on room air Abdominal:     General: There is no distension.     Tenderness: There is no abdominal tenderness.  Skin:    General: Skin is warm and dry.  Neurological:     General: No focal deficit present.     Mental Status: She is alert and oriented to person, place, and time. Mental status is at baseline.    ED Results / Procedures / Treatments   Labs (all labs ordered are listed, but only abnormal results are displayed) Labs Reviewed  RESP PANEL BY RT-PCR (FLU A&B, COVID) ARPGX2 - Abnormal; Notable for the  following components:      Result Value   Influenza A by PCR POSITIVE (*)    All other components within normal limits  BASIC METABOLIC PANEL - Abnormal; Notable for the following components:   Chloride 97 (*)    Glucose, Bld 100 (*)    Creatinine, Ser 1.04 (*)    All other components within normal limits  CBC WITH DIFFERENTIAL/PLATELET - Abnormal; Notable for the following components:   RBC 6.22 (*)    Hemoglobin 17.6 (*)    HCT 55.0 (*)    Monocytes Absolute 1.1 (*)    All other components within normal limits  BRAIN NATRIURETIC PEPTIDE - Abnormal; Notable for the following components:   B Natriuretic Peptide 130.9 (*)    All other components within normal limits  TROPONIN I (HIGH SENSITIVITY) - Abnormal; Notable for the following components:   Troponin I (High Sensitivity) 21 (*)    All other components within normal limits  TROPONIN I (HIGH SENSITIVITY) - Abnormal; Notable for the following components:   Troponin I (High Sensitivity) 19 (*)    All other components within normal limits    EKG EKG Interpretation  Date/Time:  Sunday November 28 2020 09:28:33 EST Ventricular Rate:  85 PR Interval:  148 QRS Duration: 126 QT Interval:  412 QTC Calculation: 490 R Axis:   -67 Text Interpretation: Atrial-sensed ventricular-paced rhythm Biventricular pacemaker detected Similar to prior tracing Confirmed by Octaviano Glow [phone]) on 11/28/2020 11:07:27 AM  Radiology DG Chest Portable 1 View  Result Date: 11/28/2020 CLINICAL DATA:  Shortness of breath. EXAM: PORTABLE CHEST 1 VIEW COMPARISON:  Radiographs 02/08/2020 and 07/22/2018.  CT 02/27/2020. FINDINGS: 1138 hours. Left subclavian AICD leads appear  unchanged. The heart size and mediastinal contours are stable status post mitral valve surgery and coronary artery stenting. There is mild aortic atherosclerosis. The lungs appear clear. There is no pleural effusion or pneumothorax. Postsurgical changes in the right axilla. The bones  appear unremarkable. IMPRESSION: Stable postoperative chest. No evidence of active cardiopulmonary process. Electronically Signed   By: Richardean Sale M.D.   On: 11/28/2020 12:27    Procedures Procedures   Medications Ordered in ED Medications  hydrOXYzine (ATARAX/VISTARIL) tablet 25 mg (25 mg Oral Given 11/28/20 1003)  albuterol (VENTOLIN HFA) 108 (90 Base) MCG/ACT inhaler 2 puff (2 puffs Inhalation Given 11/28/20 1246)  furosemide (LASIX) injection 20 mg (20 mg Intravenous Given 11/28/20 1257)    ED Course  I have reviewed the triage vital signs and the nursing notes.  Pertinent labs & imaging results that were available during my care of the patient were reviewed by me and considered in my medical decision making (see chart for details).  Shortness of breath Ddx includes CHF vs anemia vs PNA vs viral illness vs anxiety vs other  I was able to calm the patient down, placed her on oxygen to reassure her (she is not hypoxic or requiring oxygen), will give some ativan.  I explained her hyperventilation is causing her finger tingling.  She had refused xrays and temp at triage and covid swab, I explained the importance of letting us do our job in medically evaluating her.  Initially pulmonary auscultation difficult due to very loud sobbing  Labs, xray ordered Ativan for anxiety  I personally reviewed and interpreted the patient's labs and imaging and work-up.  I have a low suspicion for this time for pulmonary embolism, pneumonia, sepsis, ACS, other life-threatening emergency.  She has remained stable on room air.  No hypoxia.  Okay for discharge  Clinical Course as of 11/28/20 2121  Nancy Fetter Nov 28, 2020  1232 Patient appears much calmer now after the Atarax.  Nurses holding her Ativan dose.  We can give her a small dose of IV Lasix as well as some albuterol. [MT]  [id number] Influenza A By PCR(!): POSITIVE [MT]  1407 I explained to the patient her diagnosis, that she is flu positive.   Anticipate will be feeling better over the next few days.  I strongly suspect there is an anxiety component to her dyspnea.  I explained to her that she is not actually needing the oxygen on her nose.  I can offer her an albuterol pump to go home with, which may help her.  She states she has to pick up her medications from the pharmacy today.  We can also offer some Vistaril to the pharmacy.  Okay for discharge otherwise [MT]    Clinical Course User Index [MT] Sammi Stolarz, Carola Rhine, MD     Final Clinical Impression(s) / ED Diagnoses Final diagnoses:  Influenza A    Rx / DC Orders ED Discharge Orders          Ordered    albuterol (VENTOLIN HFA) 108 (90 Base) MCG/ACT inhaler  Every 4 hours PRN        11/28/20 1410    hydrOXYzine (ATARAX/VISTARIL) 25 MG tablet  Every 8 hours PRN        11/28/20 1410             Wyvonnia Dusky, MD 11/28/20 2121

## 2020-11-28 NOTE — Progress Notes (Signed)
     Pt called with severe SOB and feels so bad she wants to kill her self.  I contacted ER and they will bring her to bed ASAP her sp02 was normal.    Nada Boozer, FNP-C At [facility]  Pgr:[phone] or after 5pm and on weekends call [phone] 11/28/2020.now

## 2020-11-28 NOTE — ED Notes (Signed)
Radiology here to get pt and pt began to yell at staff about needing oxygen, this NT assisted transport by checking pt's o2 levels. Pt is 100% on room air, this NT notified pt that the scan is needed to see what is going on so we can help pt breathe better. This NT instructed pt to breathe slower by inhaling and exhaling slower and what the pt is currently breathing at is called hyperventilating. Transport took pt to radiology and pt did not cooperate with staff in radiology and pt wanted to go back to waiting room.

## 2020-11-28 NOTE — ED Notes (Signed)
Pt in WR refusing to place mask on, pt began coughing and gagging. No vomiting noted.

## 2020-11-28 NOTE — ED Triage Notes (Signed)
Pt reports SOB and chest pain x 1 week.  Hyperventilating on arrival and states she can't breath due to asbestos in her lungs.  O2 sats 98% on arrival.  Discharged from [facility] in [employer] on 11/24.  Pt speaking in complete sentences.

## 2020-11-28 NOTE — ED Notes (Signed)
Pt reports that she is breathing much better now. She state "this all started when the ceiling feel down on me at my home. I breathed all that stuff in and since then I been like this".

## 2020-11-29 ENCOUNTER — Telehealth: Payer: Self-pay | Admitting: Interventional Cardiology

## 2020-11-29 NOTE — Telephone Encounter (Signed)
EF was normal in March, so I suspect her sx are now likely due to the flu.  Troponin was minimally elevated.  No evidence of MI. No extra fluid on her CXR wither.  Stay well hydrated, usual supportive care for the flu.  Sx should improve over a few days.

## 2020-11-29 NOTE — Telephone Encounter (Signed)
Spoke with pt in regards to SOB and HR issues.  Pt expresses that SOB started on 11/24/20 reports chest, arms and lips went numb.  Pt has not had this occur since had heart surgery.  Pt called 911 and was evaluated by ED.  Troponin elevated X2.  Noted to be positive for flu prescribed an albuterol inhaler that pt states causes heart to race. Also expresses that HR speeds up and slows down continuously.  Pt does not have a working BP cuff at home.  I advised pt that SOB could be r/t influenza and to f/u with PCP also. Per pt is attempting to get an appointment with PCP.   Pt reports she is losing weigh; weighed self while on phone 89 lbs.  I advised pt that weight in ED 11/28/20 was 103 lbs.  I advised pt to recalibrate scale.  I advised pt to take PRN furosemide 40 mg PO for SOB.  Pt has not picked up newly prescribed spironolactone 25 mg PO QD.    I advised pt that I would send message to MD to address and someone would call back.

## 2020-11-29 NOTE — Telephone Encounter (Signed)
Patient notified

## 2020-11-29 NOTE — Telephone Encounter (Signed)
STAT if HR is under 50 or over 120 (normal HR is 60-100 beats per minute)  What is your heart rate? Not sure. Patient does not have a way to test it   Do you have a log of your heart rate readings (document readings)?   Do you have any other symptoms? SOB with activity.  Patient just feels like her heart is racing. She has been to the ER twice and has not gotten any better  Pt c/o Shortness Of Breath: STAT if SOB developed within the last 24 hours or pt is noticeably SOB on the phone  1. Are you currently SOB (can you hear that pt is SOB on the phone)? yes  2. How long have you been experiencing SOB?   3. Are you SOB when sitting or when up moving around? Up and moving around   4. Are you currently experiencing any other symptoms? Loosing weight but retaining fluid

## 2021-01-04 ENCOUNTER — Ambulatory Visit (INDEPENDENT_AMBULATORY_CARE_PROVIDER_SITE_OTHER): Payer: Medicare Other

## 2021-01-04 DIAGNOSIS — I428 Other cardiomyopathies: Secondary | ICD-10-CM | POA: Diagnosis not present

## 2021-01-04 LAB — CUP PACEART REMOTE DEVICE CHECK
Battery Remaining Longevity: 25 mo
Battery Voltage: 2.94 V
Brady Statistic AP VP Percent: 0.19 %
Brady Statistic AP VS Percent: 0.01 %
Brady Statistic AS VP Percent: 99.66 %
Brady Statistic AS VS Percent: 0.13 %
Brady Statistic RA Percent Paced: 0.21 %
Brady Statistic RV Percent Paced: 99.41 %
Date Time Interrogation Session: 20230103012304
HighPow Impedance: 66 Ohm
Implantable Lead Implant Date: 20190124
Implantable Lead Implant Date: 20190124
Implantable Lead Implant Date: 20190124
Implantable Lead Location: 753859
Implantable Lead Location: 753860
Implantable Lead Location: 753860
Implantable Lead Model: 3830
Implantable Lead Model: 5076
Implantable Lead Model: 6935
Implantable Pulse Generator Implant Date: 20190124
Lead Channel Impedance Value: 209 Ohm
Lead Channel Impedance Value: 266 Ohm
Lead Channel Impedance Value: 399 Ohm
Lead Channel Impedance Value: 437 Ohm
Lead Channel Impedance Value: 437 Ohm
Lead Channel Impedance Value: 532 Ohm
Lead Channel Pacing Threshold Amplitude: 0.5 V
Lead Channel Pacing Threshold Amplitude: 0.75 V
Lead Channel Pacing Threshold Amplitude: 1.125 V
Lead Channel Pacing Threshold Pulse Width: 0.4 ms
Lead Channel Pacing Threshold Pulse Width: 0.4 ms
Lead Channel Pacing Threshold Pulse Width: 0.5 ms
Lead Channel Sensing Intrinsic Amplitude: 1.75 mV
Lead Channel Sensing Intrinsic Amplitude: 1.75 mV
Lead Channel Sensing Intrinsic Amplitude: 8.25 mV
Lead Channel Sensing Intrinsic Amplitude: 8.25 mV
Lead Channel Setting Pacing Amplitude: 1.25 V
Lead Channel Setting Pacing Amplitude: 1.5 V
Lead Channel Setting Pacing Amplitude: 2.5 V
Lead Channel Setting Pacing Pulse Width: 0.4 ms
Lead Channel Setting Pacing Pulse Width: 0.5 ms
Lead Channel Setting Sensing Sensitivity: 0.3 mV

## 2021-01-14 NOTE — Progress Notes (Signed)
Remote ICD transmission.   

## 2021-01-16 NOTE — Progress Notes (Signed)
Cardiology Office Note   Date:  01/18/2021   ID:  Lindsay Camacho, DOB June 13, 1965, MRN [id number]  PCP:  System, Provider Not In    Chief Complaint  Patient presents with   Follow-up   CAD  Wt Readings from Last 3 Encounters:  01/18/21 96 lb (43.5 kg)  11/28/20 103 lb (46.7 kg)  09/07/20 112 lb 3.2 oz (50.9 kg)       History of Present Illness: Lindsay Camacho is a 56 y.o. female  with a h/o CAD s/p lateral wall MI in 2015 treated with PCI to the CXF. She had a repeat cath in 09/2014 showing patent stent. She unfortunately had a RP bleed post cath and required vascular surgery. She had another R/LHC prior to valve surgery 08/21/16 that showed patent CFX stent with minal restenosis + mild nonobstructive CAD in the RCA and LAD. She also has h/o chronic systolic HF w/ EF of 35-40%, severe MR and tobacco abuse.    She underwent minimally invasive mitral valve repair, per Dr. Cornelius Moras, for severe symptomatic MR. Surgery was  08/25/16. Post op recovery was fairly uneventfully.        She was admitted in [place], [place] on 01/17/2019 with worsening SOB r/t A/C systolic heart failure. She was diuresed with IV lasix and was transitioned to PO. She was transferred on 1/20 for ICD evaluation after an echo showed worsening EF of 20-25% (35-40% 09/2016). She did not require further diuresis. Successful Medtronic ICD implantation on 1/23 but unsuccessful LV lead due to anatomy. Has HIS and RV lead.   CHF meds have been limited by low BP in the past.    She notes that when she gives her Praluent injection on the right side, her right foot hurts when she walks.  If she tries the left side, the same thing happens with the left foot.  Echo in 2022 showed: "Well-functioning mitral valve annuloplasty ring. compared with the  echo 01/2017, systolic function ias imprved from 25% to 50-55%.   2. Left ventricular ejection fraction, by estimation, is 50 to 55%. The  left ventricle has low normal function. The left  ventricle has no regional  wall motion abnormalities. There is mild concentric left ventricular  hypertrophy. Left ventricular  diastolic parameters are indeterminate. Elevated left atrial pressure. The  average left ventricular global longitudinal strain is -11.2 %. The global  longitudinal strain is abnormal.   3. Right ventricular systolic function is normal. The right ventricular  size is normal. There is normal pulmonary artery systolic pressure.   4. Left atrial size was mildly dilated.   5. The mitral valve has been repaired/replaced. Mild mitral valve  regurgitation. No evidence of mitral stenosis. The mean mitral valve  gradient is 2.0 mmHg. Procedure Date: 08/25/2016.   6. Tricuspid valve regurgitation is moderate.   7. The aortic valve is normal in structure. Aortic valve regurgitation is  not visualized. No aortic stenosis is present.   8. The inferior vena cava is normal in size with greater than 50%  respiratory variability, suggesting right atrial pressure of 3 mmHg. "  In the past week, she has felt "lost".  She feels sometimes like she "was at the gates of [place]."  She clarifies that to mean "broken- kind of depressed."  Denies : Chest pain. Dizziness. Leg edema. Nitroglycerin use. Orthopnea. Palpitations. Paroxysmal nocturnal dyspnea. Shortness of breath. Syncope.   Past Medical History:  Diagnosis Date   Acute myocardial infarction of other  lateral wall, initial episode of care 01/2013   DES CFX   Aortoiliac occlusive disease (HCC) 08/22/2016   CAD (coronary artery disease) 11/13/2011   50% ? proximal LCx stenosis per Cath at [facility]   Chest pain, atypical, muscular skelatal   01/09/2014   COVID 02/08/2020   Dyspnea    Heart failure (HCC)    HLD (hyperlipidemia)    Hypertension    Intermediate coronary syndrome (HCC)    LBBB (left bundle branch block)    Mitral regurgitation    Nonischemic cardiomyopathy (HCC) 01/26/2017   PVD (peripheral vascular disease) (HCC)     Right CFA stenosis per report on 11/14/2011   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/25/2016   Edwards McArthy-Adams IMR ETLogix ring annuloplasty (Model 4100, Serial [id number], size 26) placed via right mini thoracotomy approach   Stenosis of left subclavian artery (HCC) 08/22/2016   Stroke (HCC)    tia with no deficits    Past Surgical History:  Procedure Laterality Date   APPENDECTOMY     BIV ICD INSERTION CRT-D N/A 01/24/2017   Procedure: BIV ICD INSERTION CRT-D;  Surgeon: Marinus Maw, MD;  Location: [facility] INVASIVE CV LAB;  Service: Cardiovascular;  Laterality: N/A;   CARDIAC CATHETERIZATION  04/14/2013   Non-obstructive disease, patent CFX stent   CARDIAC CATHETERIZATION N/A 09/21/2014   Procedure: Left Heart Cath and Coronary Angiography;  Surgeon: Lennette Bihari, MD;  Location: [facility] INVASIVE CV LAB;  Service: Cardiovascular;  Laterality: N/A;   CORONARY ANGIOPLASTY WITH STENT PLACEMENT  01/17/2013   mild disease except 99% CFX, rx with  2.5 x 28 Alpine drug-eluting stent    GROIN DEBRIDEMENT Right 04/14/2013   Procedure: Emergency Evacuation of Retroperitoneal Hematoma and Repair Right External Iliac Artery Pseudoaneurysm    ;  Surgeon: Sherren Kerns, MD;  Location: [facility] OR;  Service: Vascular;  Laterality: Right;   LEFT HEART CATHETERIZATION WITH CORONARY ANGIOGRAM N/A 01/18/2013   Procedure: LEFT HEART CATHETERIZATION WITH CORONARY ANGIOGRAM;  Surgeon: Corky Crafts, MD;  Location: [facility] CATH LAB;  Service: Cardiovascular;  Laterality: N/A;   LEFT HEART CATHETERIZATION WITH CORONARY ANGIOGRAM N/A 04/14/2013   Procedure: LEFT HEART CATHETERIZATION WITH CORONARY ANGIOGRAM;  Surgeon: Corky Crafts, MD;  Location: [facility] CATH LAB;  Service: Cardiovascular;  Laterality: N/A;   MITRAL VALVE REPAIR Right 08/25/2016   Procedure: MINIMALLY INVASIVE MITRAL VALVE REPAIR;  Surgeon: Purcell Nails, MD;  Location: [facility] OR;  Service: Open Heart Surgery;  Laterality: Right;   MULTIPLE  EXTRACTIONS WITH ALVEOLOPLASTY N/A 08/23/2016   Procedure: Extraction of tooth #'s 17, 20-23, 26-29 with alveoloplasty;  Surgeon: [name], DDS;  Location: [facility] OR;  Service: Oral Surgery;  Laterality: N/A;   PERCUTANEOUS CORONARY STENT INTERVENTION (PCI-S)  01/18/2013   Procedure: PERCUTANEOUS CORONARY STENT INTERVENTION (PCI-S);  Surgeon: Corky Crafts, MD;  Location: [facility] CATH LAB;  Service: Cardiovascular;;   RIGHT/LEFT HEART CATH AND CORONARY ANGIOGRAPHY N/A 08/21/2016   Procedure: RIGHT/LEFT HEART CATH AND CORONARY ANGIOGRAPHY;  Surgeon: Kathleene Hazel, MD;  Location: [facility] INVASIVE CV LAB;  Service: Cardiovascular;  Laterality: N/A;   TEE WITHOUT CARDIOVERSION N/A 03/06/2014   Procedure: TRANSESOPHAGEAL ECHOCARDIOGRAM (TEE);  Surgeon: Lars Masson, MD;  Location: [facility] ENDOSCOPY;  Service: Cardiovascular;  Laterality: N/A;   TEE WITHOUT CARDIOVERSION N/A 08/18/2016   Procedure: TRANSESOPHAGEAL ECHOCARDIOGRAM (TEE);  Surgeon: Lewayne Bunting, MD;  Location: [facility] ENDOSCOPY;  Service: Cardiovascular;  Laterality: N/A;   TEE WITHOUT CARDIOVERSION N/A 08/25/2016  Procedure: TRANSESOPHAGEAL ECHOCARDIOGRAM (TEE);  Surgeon: Purcell Nails, MD;  Location: [facility] OR;  Service: Open Heart Surgery;  Laterality: N/A;   TUBAL LIGATION       Current Outpatient Medications  Medication Sig Dispense Refill   acetaminophen (TYLENOL) 500 MG tablet Take 1,000 mg by mouth every 6 (six) hours as needed for headache (pain).     albuterol (VENTOLIN HFA) 108 (90 Base) MCG/ACT inhaler Inhale into the lungs every 6 (six) hours as needed for wheezing or shortness of breath.     albuterol (VENTOLIN HFA) 108 (90 Base) MCG/ACT inhaler Inhale 2 puffs into the lungs every 4 (four) hours as needed for wheezing or shortness of breath. 8 g 0   aspirin EC 81 MG tablet Take 81 mg by mouth daily.     atorvastatin (LIPITOR) 80 MG tablet Take 1 tablet (80 mg total) by mouth daily. 90 tablet 3   Bempedoic Acid  (NEXLETOL) 180 MG TABS Take 180 mg by mouth daily. 90 tablet 2   carvedilol (COREG) 6.25 MG tablet Take 1 tablet (6.25 mg total) by mouth 2 (two) times daily. 180 tablet 3   clopidogrel (PLAVIX) 75 MG tablet Take 1 tablet (75 mg total) by mouth daily. 90 tablet 3   ergocalciferol (VITAMIN D2) 1.25 MG (50000 UT) capsule Take 50,000 Units by mouth once a week.     furosemide (LASIX) 40 MG tablet Take 1 tablet (40 mg total) by mouth daily as needed for fluid or edema (shortness of breath). 90 tablet 3   hydrOXYzine (ATARAX/VISTARIL) 25 MG tablet Take 1 tablet (25 mg total) by mouth every 8 (eight) hours as needed for up to 21 doses for anxiety. 21 tablet 0   losartan (COZAAR) 25 MG tablet Take 0.5 tablets (12.5 mg total) by mouth daily. 45 tablet 3   methocarbamol (ROBAXIN) 500 MG tablet Take 1 tablet (500 mg total) by mouth every 8 (eight) hours as needed for muscle spasms. 15 tablet 0   nicotine (NICODERM CQ) 7 mg/24hr patch Place 1 patch (7 mg total) onto the skin daily. 28 patch 0   pantoprazole (PROTONIX) 40 MG tablet Take 1 tablet (40 mg total) by mouth daily. Future refills need to be from PCP! 30 tablet 1   potassium chloride (KLOR-CON) 10 MEQ tablet Take 2 tablets (20 mEq total) by mouth daily. 180 tablet 3   spironolactone (ALDACTONE) 25 MG tablet Take 25 mg by mouth daily.     SPIRIVA RESPIMAT 1.25 MCG/ACT AERS Take 2 puffs by mouth daily.     No current facility-administered medications for this visit.    Allergies:   Aldomet [methyldopa], Brilinta [ticagrelor], Other, Coumadin [warfarin sodium], Eliquis [apixaban], Lisinopril, Trazodone, and Penicillins    Social History:  The patient  reports that she has been smoking cigarettes. She has been smoking an average of .5 packs per day. She has never used smokeless tobacco. She reports current drug use. Drug: Marijuana. She reports that she does not drink alcohol.   Family History:  The patient's family history includes Bladder Cancer in  her mother; CAD (age of onset: 16) in her mother; CAD (age of onset: 37) in her father; Heart attack in her father and mother; Heart disease in her father and mother; Hypertension in her mother; Lung cancer in her mother; Stroke in her father and mother.    ROS:  Please see the history of present illness.   Otherwise, review of systems are positive for weight loss.  All other systems are reviewed and negative.    PHYSICAL EXAM: VS:  BP 114/82    Pulse 77    Ht 5' 1.5" (1.562 m)    Wt 96 lb (43.5 kg)    SpO2 98%    BMI 17.85 kg/m  , BMI Body mass index is 17.85 kg/m. GEN: Well nourished, well developed, in no acute distress, thin HEENT: normal Neck: no JVD, left carotid bruit present, or masses Cardiac: RRR; no murmurs, rubs, or gallops,no edema  Respiratory:  clear to auscultation bilaterally, normal work of breathing GI: soft, nontender, nondistended, + BS MS: no deformity or atrophy; 2+ PT pulses bilaterally Skin: warm and dry, no rash Neuro:  Strength and sensation are intact Psych: euthymic mood, full affect   EKG:   The ekg ordered 11/2020 demonstrates NSR, V paced   Recent Labs: 09/07/2020: ALT 11 11/28/2020: B Natriuretic Peptide 130.9; BUN 10; Creatinine, Ser 1.04; Hemoglobin 17.6; Platelets 193; Potassium 4.1; Sodium 135   Lipid Panel    Component Value Date/Time   CHOL 132 03/07/2019 0923   TRIG 62 03/07/2019 0923   HDL 40 03/07/2019 0923   CHOLHDL 3.3 03/07/2019 0923   CHOLHDL 3.6 07/23/2018 1017   VLDL 21 07/23/2018 1017   LDLCALC 79 03/07/2019 0923     Other studies Reviewed: Additional studies/ records that were reviewed today with results demonstrating: labs and echo reviewed.   ASSESSMENT AND PLAN:  CAD/Old MI: s/p circ PCI.  No angina on medical therapy.  Continue aggressive secondary prevention. MV repair: Needs SBE prophylaxis.  No CHF.  Chronic systolic heart failure: Low-salt diet.  Medications have been limited by blood pressure. Hyperlipidemia:  Whole food, plant-based diet recommended.  Avoid processed foods.  She is trying to gain weight.  Tobacco abuse: She needs to stop smoking. Left carotid bruit: Check carotid Doppler.   Not interested in COVID vaccines:  Palpitations: Describes racing.  She is at risk of AFib given the valvular problem.  We will check to see if anything is showing up on her device checks that would indicate atrial fibrillation.     Current medicines are reviewed at length with the patient today.  The patient concerns regarding her medicines were addressed.  The following changes have been made:  No change  Labs/ tests ordered today include:  No orders of the defined types were placed in this encounter.   Recommend 150 minutes/week of aerobic exercise Low fat, low carb, high fiber diet recommended  Disposition:   FU in 1 year, will also f/u with EP.   Signed, Lance Muss, MD  01/18/2021 2:30 PM    [facility] [address] [place], [place], [place]  [address] Phone: [phone]; Fax: [phone]

## 2021-01-18 ENCOUNTER — Other Ambulatory Visit: Payer: Self-pay

## 2021-01-18 ENCOUNTER — Ambulatory Visit (INDEPENDENT_AMBULATORY_CARE_PROVIDER_SITE_OTHER): Payer: Medicare Other | Admitting: Interventional Cardiology

## 2021-01-18 ENCOUNTER — Encounter: Payer: Self-pay | Admitting: Interventional Cardiology

## 2021-01-18 VITALS — BP 114/82 | HR 77 | Ht 61.5 in | Wt 96.0 lb

## 2021-01-18 DIAGNOSIS — Z720 Tobacco use: Secondary | ICD-10-CM | POA: Diagnosis not present

## 2021-01-18 DIAGNOSIS — I25118 Atherosclerotic heart disease of native coronary artery with other forms of angina pectoris: Secondary | ICD-10-CM | POA: Diagnosis not present

## 2021-01-18 DIAGNOSIS — I5022 Chronic systolic (congestive) heart failure: Secondary | ICD-10-CM

## 2021-01-18 DIAGNOSIS — R0989 Other specified symptoms and signs involving the circulatory and respiratory systems: Secondary | ICD-10-CM

## 2021-01-18 DIAGNOSIS — Z95810 Presence of automatic (implantable) cardiac defibrillator: Secondary | ICD-10-CM | POA: Diagnosis not present

## 2021-01-18 DIAGNOSIS — Z9889 Other specified postprocedural states: Secondary | ICD-10-CM

## 2021-01-18 NOTE — Patient Instructions (Signed)
Medication Instructions:  Your physician recommends that you continue on your current medications as directed. Please refer to the Current Medication list given to you today.  *If you need a refill on your cardiac medications before your next appointment, please call your pharmacy*   Lab Work: none If you have labs (blood work) drawn today and your tests are completely normal, you will receive your results only by: [place] (if you have MyChart) OR A paper copy in the mail If you have any lab test that is abnormal or we need to change your treatment, we will call you to review the results.   Testing/Procedures: Your physician has requested that you have a carotid duplex. This test is an ultrasound of the carotid arteries in your neck. It looks at blood flow through these arteries that supply the brain with blood. Allow one hour for this exam. There are no restrictions or special instructions.    Follow-Up: At [facility], you and your health needs are our priority.  As part of our continuing mission to provide you with exceptional heart care, we have created designated Provider Care Teams.  These Care Teams include your primary Cardiologist (physician) and Advanced Practice Providers (APPs -  Physician Assistants and Nurse Practitioners) who all work together to provide you with the care you need, when you need it.  We recommend signing up for the patient portal called "MyChart".  Sign up information is provided on this After Visit Summary.  MyChart is used to connect with patients for Virtual Visits (Telemedicine).  Patients are able to view lab/test results, encounter notes, upcoming appointments, etc.  Non-urgent messages can be sent to your provider as well.   To learn more about what you can do with MyChart, go to [email or web address].    Your next appointment:   12 month(s)  The format for your next appointment:   In Person  Provider:   Larae Grooms, MD      Other Instructions   Let us know if you are having palpitations and device readings can be checked.

## 2021-01-28 ENCOUNTER — Ambulatory Visit (HOSPITAL_COMMUNITY)
Admission: RE | Admit: 2021-01-28 | Discharge: 2021-01-28 | Disposition: A | Discharge status: Home / Self Care | Payer: Medicare Other | Source: Ambulatory Visit | Attending: Cardiology | Admitting: Cardiology

## 2021-01-28 ENCOUNTER — Other Ambulatory Visit: Payer: Self-pay

## 2021-01-28 DIAGNOSIS — R0989 Other specified symptoms and signs involving the circulatory and respiratory systems: Secondary | ICD-10-CM | POA: Diagnosis not present

## 2021-01-31 ENCOUNTER — Other Ambulatory Visit: Payer: Self-pay

## 2021-01-31 DIAGNOSIS — I771 Stricture of artery: Secondary | ICD-10-CM

## 2021-03-01 ENCOUNTER — Other Ambulatory Visit: Payer: Self-pay

## 2021-03-01 ENCOUNTER — Encounter: Payer: Self-pay | Admitting: Cardiovascular Disease

## 2021-03-01 ENCOUNTER — Ambulatory Visit (INDEPENDENT_AMBULATORY_CARE_PROVIDER_SITE_OTHER): Payer: Medicare Other | Admitting: Cardiovascular Disease

## 2021-03-01 VITALS — BP 166/88 | HR 76 | Resp 20 | Ht 61.0 in | Wt 98.2 lb

## 2021-03-01 DIAGNOSIS — Z720 Tobacco use: Secondary | ICD-10-CM

## 2021-03-01 DIAGNOSIS — I5022 Chronic systolic (congestive) heart failure: Secondary | ICD-10-CM

## 2021-03-01 DIAGNOSIS — I2510 Atherosclerotic heart disease of native coronary artery without angina pectoris: Secondary | ICD-10-CM

## 2021-03-01 DIAGNOSIS — I771 Stricture of artery: Secondary | ICD-10-CM

## 2021-03-01 DIAGNOSIS — E785 Hyperlipidemia, unspecified: Secondary | ICD-10-CM

## 2021-03-01 NOTE — Progress Notes (Signed)
Cardiology Office Note   Date:  03/01/2021   ID:  Aryanah, Enslow 1965/10/17, MRN [id number]  PCP:  System, Provider Not In  Cardiologist:  Dr. Eldridge Dace  No chief complaint on file.     History of Present Illness: TIA HIERONYMUS is a 56 y.o. female who was referred by Dr. Eldridge Dace for evaluation of subclavian artery stenosis. She has known history of coronary artery disease status post lateral MI in 2015 treated with PCI to the left circumflex.  Cardiac catheterization was complicated by retroperitoneal bleed requiring vascular surgery.  She has known history of chronic systolic heart failure and severe mitral regurgitation status post mitral valve repair in 2018.  There is prolonged history of tobacco use.  She is status post an ICD placement. The patient was noted to have left carotid bruit and thus she underwent carotid Doppler which showed no significant carotid disease.  She was noted to have left subclavian stenosis or occlusion with 40 mm pressure difference between the right and left arm and retrograde flow in the left vertebral artery.   She is left-handed.  She reports that her left hand is colder than the right and feels that the left arm is weaker with some numbness.  However, she does not feel that the symptoms are severe and she has no symptoms suggestive of subclavian steal. She smokes almost 1 pack/day but she is not diabetic.   Past Medical History:  Diagnosis Date   Acute myocardial infarction of other lateral wall, initial episode of care 01/2013   DES CFX   Aortoiliac occlusive disease (HCC) 08/22/2016   CAD (coronary artery disease) 11/13/2011   50% ? proximal LCx stenosis per Cath at [facility]   Chest pain, atypical, muscular skelatal   01/09/2014   COVID 02/08/2020   Dyspnea    Heart failure (HCC)    HLD (hyperlipidemia)    Hypertension    Intermediate coronary syndrome (HCC)    LBBB (left bundle branch block)    Mitral regurgitation    Nonischemic  cardiomyopathy (HCC) 01/26/2017   PVD (peripheral vascular disease) (HCC)    Right CFA stenosis per report on 11/14/2011   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/25/2016   Edwards McArthy-Adams IMR ETLogix ring annuloplasty (Model 4100, Serial [id number], size 26) placed via right mini thoracotomy approach   Stenosis of left subclavian artery (HCC) 08/22/2016   Stroke (HCC)    tia with no deficits    Past Surgical History:  Procedure Laterality Date   APPENDECTOMY     BIV ICD INSERTION CRT-D N/A 01/24/2017   Procedure: BIV ICD INSERTION CRT-D;  Surgeon: Marinus Maw, MD;  Location: [facility] INVASIVE CV LAB;  Service: Cardiovascular;  Laterality: N/A;   CARDIAC CATHETERIZATION  04/14/2013   Non-obstructive disease, patent CFX stent   CARDIAC CATHETERIZATION N/A 09/21/2014   Procedure: Left Heart Cath and Coronary Angiography;  Surgeon: Lennette Bihari, MD;  Location: [facility] INVASIVE CV LAB;  Service: Cardiovascular;  Laterality: N/A;   CORONARY ANGIOPLASTY WITH STENT PLACEMENT  01/17/2013   mild disease except 99% CFX, rx with  2.5 x 28 Alpine drug-eluting stent    GROIN DEBRIDEMENT Right 04/14/2013   Procedure: Emergency Evacuation of Retroperitoneal Hematoma and Repair Right External Iliac Artery Pseudoaneurysm    ;  Surgeon: Sherren Kerns, MD;  Location: [facility] OR;  Service: Vascular;  Laterality: Right;   LEFT HEART CATHETERIZATION WITH CORONARY ANGIOGRAM N/A 01/18/2013   Procedure: LEFT HEART CATHETERIZATION WITH  CORONARY ANGIOGRAM;  Surgeon: Corky Crafts, MD;  Location: [name] CATH LAB;  Service: Cardiovascular;  Laterality: N/A;   LEFT HEART CATHETERIZATION WITH CORONARY ANGIOGRAM N/A 04/14/2013   Procedure: LEFT HEART CATHETERIZATION WITH CORONARY ANGIOGRAM;  Surgeon: Corky Crafts, MD;  Location: [facility] CATH LAB;  Service: Cardiovascular;  Laterality: N/A;   MITRAL VALVE REPAIR Right 08/25/2016   Procedure: MINIMALLY INVASIVE MITRAL VALVE REPAIR;  Surgeon: Purcell Nails, MD;  Location:  [facility] OR;  Service: Open Heart Surgery;  Laterality: Right;   MULTIPLE EXTRACTIONS WITH ALVEOLOPLASTY N/A 08/23/2016   Procedure: Extraction of tooth #'s 17, 20-23, 26-29 with alveoloplasty;  Surgeon: [name], DDS;  Location: [facility] OR;  Service: Oral Surgery;  Laterality: N/A;   PERCUTANEOUS CORONARY STENT INTERVENTION (PCI-S)  01/18/2013   Procedure: PERCUTANEOUS CORONARY STENT INTERVENTION (PCI-S);  Surgeon: Corky Crafts, MD;  Location: [facility] CATH LAB;  Service: Cardiovascular;;   RIGHT/LEFT HEART CATH AND CORONARY ANGIOGRAPHY N/A 08/21/2016   Procedure: RIGHT/LEFT HEART CATH AND CORONARY ANGIOGRAPHY;  Surgeon: Kathleene Hazel, MD;  Location: [facility] INVASIVE CV LAB;  Service: Cardiovascular;  Laterality: N/A;   TEE WITHOUT CARDIOVERSION N/A 03/06/2014   Procedure: TRANSESOPHAGEAL ECHOCARDIOGRAM (TEE);  Surgeon: Lars Masson, MD;  Location: [facility] ENDOSCOPY;  Service: Cardiovascular;  Laterality: N/A;   TEE WITHOUT CARDIOVERSION N/A 08/18/2016   Procedure: TRANSESOPHAGEAL ECHOCARDIOGRAM (TEE);  Surgeon: Lewayne Bunting, MD;  Location: [facility] ENDOSCOPY;  Service: Cardiovascular;  Laterality: N/A;   TEE WITHOUT CARDIOVERSION N/A 08/25/2016   Procedure: TRANSESOPHAGEAL ECHOCARDIOGRAM (TEE);  Surgeon: Purcell Nails, MD;  Location: [facility] OR;  Service: Open Heart Surgery;  Laterality: N/A;   TUBAL LIGATION       Current Outpatient Medications  Medication Sig Dispense Refill   acetaminophen (TYLENOL) 500 MG tablet Take 1,000 mg by mouth every 6 (six) hours as needed for headache (pain).     albuterol (VENTOLIN HFA) 108 (90 Base) MCG/ACT inhaler Inhale into the lungs every 6 (six) hours as needed for wheezing or shortness of breath.     albuterol (VENTOLIN HFA) 108 (90 Base) MCG/ACT inhaler Inhale 2 puffs into the lungs every 4 (four) hours as needed for wheezing or shortness of breath. 8 g 0   aspirin EC 81 MG tablet Take 81 mg by mouth daily.     atorvastatin (LIPITOR) 80 MG tablet Take 1  tablet (80 mg total) by mouth daily. 90 tablet 3   Bempedoic Acid (NEXLETOL) 180 MG TABS Take 180 mg by mouth daily. 90 tablet 2   carvedilol (COREG) 6.25 MG tablet Take 1 tablet (6.25 mg total) by mouth 2 (two) times daily. 180 tablet 3   clopidogrel (PLAVIX) 75 MG tablet Take 1 tablet (75 mg total) by mouth daily. 90 tablet 3   ergocalciferol (VITAMIN D2) 1.25 MG (50000 UT) capsule Take 50,000 Units by mouth once a week.     furosemide (LASIX) 40 MG tablet Take 1 tablet (40 mg total) by mouth daily as needed for fluid or edema (shortness of breath). 90 tablet 3   methocarbamol (ROBAXIN) 500 MG tablet Take 1 tablet (500 mg total) by mouth every 8 (eight) hours as needed for muscle spasms. 15 tablet 0   pantoprazole (PROTONIX) 40 MG tablet Take 1 tablet (40 mg total) by mouth daily. Future refills need to be from PCP! 30 tablet 1   potassium chloride (KLOR-CON) 10 MEQ tablet Take 2 tablets (20 mEq total) by mouth daily. 180 tablet 3   SPIRIVA  RESPIMAT 1.25 MCG/ACT AERS Take 2 puffs by mouth daily.     spironolactone (ALDACTONE) 25 MG tablet Take 25 mg by mouth daily.     losartan (COZAAR) 25 MG tablet Take 0.5 tablets (12.5 mg total) by mouth daily. 45 tablet 3   No current facility-administered medications for this visit.    Allergies:   Aldomet [methyldopa], Brilinta [ticagrelor], Other, Coumadin [warfarin sodium], Eliquis [apixaban], Lisinopril, Trazodone, and Penicillins    Social History:  The patient  reports that she has been smoking cigarettes. She has been smoking an average of .5 packs per day. She has never used smokeless tobacco. She reports current drug use. Drug: Marijuana. She reports that she does not drink alcohol.   Family History:  The patient's family history includes Bladder Cancer in her mother; CAD (age of onset: 65) in her mother; CAD (age of onset: 42) in her father; Heart attack in her father and mother; Heart disease in her father and mother; Hypertension in her  mother; Lung cancer in her mother; Stroke in her father and mother.    ROS:  Please see the history of present illness.   Otherwise, review of systems are positive for none.   All other systems are reviewed and negative.    PHYSICAL EXAM: VS:  BP (!) 166/88 (BP Location: Right Arm, Patient Position: Sitting, Cuff Size: Normal)    Pulse 76    Resp 20    Ht 5\' 1"  (1.549 m)    Wt 98 lb 3.2 oz (44.5 kg)    SpO2 99%    BMI 18.55 kg/m  , BMI Body mass index is 18.55 kg/m. GEN: Well nourished, well developed, in no acute distress  HEENT: normal  Neck: no JVD,  or masses.  Right carotid bruit.  There is a bruit at the left subclavian artery area. Cardiac: RRR; no rubs, or gallops,no edema .  1 out of 6 systolic murmur at the base. Respiratory:  clear to auscultation bilaterally, normal work of breathing GI: soft, nontender, nondistended, + BS MS: no deformity or atrophy  Skin: warm and dry, no rash Neuro:  Strength and sensation are intact Psych: euthymic mood, full affect Vascular: Radial pulses +2 on the right and slightly diminished on the left.  Brachial pulses equal in both arms.  Blood pressure was checked in the left arm and it was 138/84.   EKG:  EKG is not ordered today.    Recent Labs: 09/07/2020: ALT 11 11/28/2020: B Natriuretic Peptide 130.9; BUN 10; Creatinine, Ser 1.04; Hemoglobin 17.6; Platelets 193; Potassium 4.1; Sodium 135    Lipid Panel    Component Value Date/Time   CHOL 132 03/07/2019 0923   TRIG 62 03/07/2019 0923   HDL 40 03/07/2019 0923   CHOLHDL 3.3 03/07/2019 0923   CHOLHDL 3.6 07/23/2018 1017   VLDL 21 07/23/2018 1017   LDLCALC 79 03/07/2019 0923      Wt Readings from Last 3 Encounters:  03/01/21 98 lb 3.2 oz (44.5 kg)  01/18/21 96 lb (43.5 kg)  11/28/20 103 lb (46.7 kg)       No flowsheet data found.    ASSESSMENT AND PLAN:  1.  Left subclavian artery stenosis: The patient does have symptoms but overall seem to be mild and she does not  feel to be significantly limited by her left arm claudication.  In addition, she has no clinical symptoms of steal syndrome.  Blood pressure difference today was 30 mmHg between the right and left  arm.  I discussed with her management options including angiography and possible revascularization.  However, given that her symptoms are not severe, it is reasonable to continue with medical therapy for now and reserve revascularization for worsening symptoms. Her blood pressure should always be checked in the right arm which is more reflective of her central aortic pressure.  2.  Coronary artery disease involving native coronary arteries without angina: She seems to be stable overall.  3.  Chronic systolic heart failure: She appears to be euvolemic.  Some of her heart failure medications can likely be uptitrated as long as we rely on blood pressure readings in the right arm.  4.  Status post mitral valve repair: Stable.  5.  Tobacco use: I discussed with her the importance of smoking cessation and provided her with instructions.  6.  Hyperlipidemia: She is on high-dose atorvastatin.  Recommended target LDL of less than 70.    Disposition:   FU with me in 6 months  Signed,  Lorine Bears, MD  03/01/2021 1:10 PM    [facility]

## 2021-03-01 NOTE — Patient Instructions (Signed)
Medication Instructions:  No changes *If you need a refill on your cardiac medications before your next appointment, please call your pharmacy*   Lab Work: None ordered If you have labs (blood work) drawn today and your tests are completely normal, you will receive your results only by: MyChart Message (if you have MyChart) OR A paper copy in the mail If you have any lab test that is abnormal or we need to change your treatment, we will call you to review the results.   Testing/Procedures: None ordered   Follow-Up: At [facility], you and your health needs are our priority.  As part of our continuing mission to provide you with exceptional heart care, we have created designated Provider Care Teams.  These Care Teams include your primary Cardiologist (physician) and Advanced Practice Providers (APPs -  Physician Assistants and Nurse Practitioners) who all work together to provide you with the care you need, when you need it.  We recommend signing up for the patient portal called "MyChart".  Sign up information is provided on this After Visit Summary.  MyChart is used to connect with patients for Virtual Visits (Telemedicine).  Patients are able to view lab/test results, encounter notes, upcoming appointments, etc.  Non-urgent messages can be sent to your provider as well.   To learn more about what you can do with MyChart, go to [email or web address].    Your next appointment:   6 month(s)  The format for your next appointment:   In Person  Provider:   Dr. Kirke Corin  Other Instructions You may call the 1-800-QUIT-NOW for help with quitting the smoking.

## 2021-03-04 ENCOUNTER — Telehealth: Payer: Self-pay

## 2021-03-04 NOTE — Telephone Encounter (Signed)
The patient states her monitor beeped. She sent a transmission for the nurse to review. I let her speak with Lanora Manis, rn. ?

## 2021-04-05 ENCOUNTER — Ambulatory Visit (INDEPENDENT_AMBULATORY_CARE_PROVIDER_SITE_OTHER): Payer: Medicare HMO

## 2021-04-05 DIAGNOSIS — I428 Other cardiomyopathies: Secondary | ICD-10-CM

## 2021-04-05 LAB — CUP PACEART REMOTE DEVICE CHECK
Battery Remaining Longevity: 24 mo
Battery Voltage: 2.94 V
Brady Statistic AP VP Percent: 0.05 %
Brady Statistic AP VS Percent: 0.01 %
Brady Statistic AS VP Percent: 99.87 %
Brady Statistic AS VS Percent: 0.08 %
Brady Statistic RA Percent Paced: 0.06 %
Brady Statistic RV Percent Paced: 99.77 %
Date Time Interrogation Session: 20230404022703
HighPow Impedance: 65 Ohm
Implantable Lead Implant Date: 20190124
Implantable Lead Implant Date: 20190124
Implantable Lead Implant Date: 20190124
Implantable Lead Location: 753859
Implantable Lead Location: 753860
Implantable Lead Location: 753860
Implantable Lead Model: 3830
Implantable Lead Model: 5076
Implantable Lead Model: 6935
Implantable Pulse Generator Implant Date: 20190124
Lead Channel Impedance Value: 209 Ohm
Lead Channel Impedance Value: 266 Ohm
Lead Channel Impedance Value: 437 Ohm
Lead Channel Impedance Value: 437 Ohm
Lead Channel Impedance Value: 456 Ohm
Lead Channel Impedance Value: 570 Ohm
Lead Channel Pacing Threshold Amplitude: 0.5 V
Lead Channel Pacing Threshold Amplitude: 0.875 V
Lead Channel Pacing Threshold Amplitude: 1 V
Lead Channel Pacing Threshold Pulse Width: 0.4 ms
Lead Channel Pacing Threshold Pulse Width: 0.4 ms
Lead Channel Pacing Threshold Pulse Width: 0.5 ms
Lead Channel Sensing Intrinsic Amplitude: 1.625 mV
Lead Channel Sensing Intrinsic Amplitude: 1.625 mV
Lead Channel Sensing Intrinsic Amplitude: 7.375 mV
Lead Channel Sensing Intrinsic Amplitude: 7.375 mV
Lead Channel Setting Pacing Amplitude: 1.5 V
Lead Channel Setting Pacing Amplitude: 1.5 V
Lead Channel Setting Pacing Amplitude: 2.5 V
Lead Channel Setting Pacing Pulse Width: 0.4 ms
Lead Channel Setting Pacing Pulse Width: 0.5 ms
Lead Channel Setting Sensing Sensitivity: 0.3 mV

## 2021-04-15 ENCOUNTER — Telehealth: Payer: Self-pay | Admitting: Internal Medicine

## 2021-04-21 NOTE — Progress Notes (Signed)
Remote ICD transmission.   

## 2021-05-24 ENCOUNTER — Telehealth: Payer: Self-pay

## 2021-05-24 NOTE — Telephone Encounter (Signed)
Outreach made to Pt.  Advised paperwork had been faxed to [facility] as requested.   Pt states she does not need a copy.

## 2021-06-13 NOTE — Telephone Encounter (Signed)
Josie with [employer] is following up. She states they did not receive the fax with chronic condition. She would like to know if we are able to resend it.  Phone#: [phone] Fax#: [phone] [email or web address]

## 2021-06-14 NOTE — Telephone Encounter (Signed)
Returned call to [employer].  Advised to refax form as the completed form has not been scanned to Pt's chart.  Operator advises she will "pass this information on".    Await further needs.

## 2021-06-22 NOTE — Telephone Encounter (Signed)
2nd attempt to contact [employer].  Unable to get through.  Will send fax requesting they REFAX requested document to fax # [phone].  Await paperwork.

## 2021-07-15 ENCOUNTER — Telehealth: Payer: Self-pay | Admitting: Interventional Cardiology

## 2021-07-15 NOTE — Telephone Encounter (Signed)
Audin with [facility] [employer] is following up regarding chronic condition form. She states it has been faxed to the office twice and they still have not received a response. She mentions if unable to fax, they will also take the form via email at [email or web address]. Per Loyce Dys, they need this verification by tomorrow, 7/15 or patient's coverage will be terminated. They will also take a verbal verification if necessary. Please assist ASAP.  Phone#: [phone] Fax#: [phone]

## 2021-07-15 NOTE — Telephone Encounter (Signed)
I returned call to number provided and spoke with Joselyn Glassman.  He is unable to connect me to [place] and not able to get any information regarding call that was placed to our office.

## 2021-07-18 NOTE — Telephone Encounter (Signed)
Late entry:  On Friday 07/15/21, I returned call to [facility] [employer].  I was transferred 4 times and spoke with Geralynn Ochs, and Loraine Leriche all who could not help with their request. The total time spent on the call was 60 minuets and no one could help me get the form (Chronic Condition Form) that they are requesting for the patient in order for her to continue with benefits.  I spoke with the claims department, provider relations and no one could locate the specific form they are requesting.  I requested to speak to supervisor, on the last transfer, but was on hold for >15 minutes so disconnected call.

## 2021-07-18 NOTE — Telephone Encounter (Signed)
Returned call to [employer].  Was transferred two times.    2nd transfer was sent to Benefits department.  Able to provide Pt name and date of birth, but our office does not have Pt's [employer] ID number yet, and Pt may have moved.  Requested to speak to supervisor, but was on hold for >15 minutes so disconnected call.  WILL FAX ADDITIONAL REQUEST TO [employer] TO RESUBMIT CHRONIC CONDITION FORM.  Please fax chronic condition form to [phone] attn: Boneta Lucks RN for Dr. Ladona Ridgel.  Await further needs.

## 2021-07-21 NOTE — Telephone Encounter (Signed)
Sent phone note to email provided by initial caller.  Await chronic condition form

## 2021-08-14 ENCOUNTER — Emergency Department (HOSPITAL_COMMUNITY)
Admission: EM | Admit: 2021-08-14 | Discharge: 2021-08-15 | Disposition: A | Discharge status: Home / Self Care | Payer: Medicare HMO | Attending: Emergency Medicine | Admitting: Emergency Medicine

## 2021-08-14 ENCOUNTER — Emergency Department (HOSPITAL_COMMUNITY): Payer: Medicare HMO

## 2021-08-14 ENCOUNTER — Other Ambulatory Visit: Payer: Self-pay

## 2021-08-14 ENCOUNTER — Encounter (HOSPITAL_COMMUNITY): Payer: Self-pay

## 2021-08-14 DIAGNOSIS — Z79899 Other long term (current) drug therapy: Secondary | ICD-10-CM | POA: Diagnosis not present

## 2021-08-14 DIAGNOSIS — Z7982 Long term (current) use of aspirin: Secondary | ICD-10-CM | POA: Insufficient documentation

## 2021-08-14 DIAGNOSIS — I509 Heart failure, unspecified: Secondary | ICD-10-CM | POA: Diagnosis not present

## 2021-08-14 DIAGNOSIS — Z7902 Long term (current) use of antithrombotics/antiplatelets: Secondary | ICD-10-CM | POA: Diagnosis not present

## 2021-08-14 DIAGNOSIS — I2510 Atherosclerotic heart disease of native coronary artery without angina pectoris: Secondary | ICD-10-CM | POA: Insufficient documentation

## 2021-08-14 DIAGNOSIS — H532 Diplopia: Secondary | ICD-10-CM | POA: Insufficient documentation

## 2021-08-14 DIAGNOSIS — I10 Essential (primary) hypertension: Secondary | ICD-10-CM | POA: Diagnosis present

## 2021-08-14 DIAGNOSIS — I110 Hypertensive heart disease with heart failure: Secondary | ICD-10-CM | POA: Diagnosis not present

## 2021-08-14 DIAGNOSIS — I5022 Chronic systolic (congestive) heart failure: Secondary | ICD-10-CM

## 2021-08-14 DIAGNOSIS — I428 Other cardiomyopathies: Secondary | ICD-10-CM

## 2021-08-14 LAB — BASIC METABOLIC PANEL
Anion gap: 7 (ref 5–15)
BUN: 16 mg/dL (ref 6–20)
CO2: 24 mmol/L (ref 22–32)
Calcium: 8.6 mg/dL — ABNORMAL LOW (ref 8.9–10.3)
Chloride: 108 mmol/L (ref 98–111)
Creatinine, Ser: 0.77 mg/dL (ref 0.44–1.00)
GFR, Estimated: >60 mL/min (ref >60)
Glucose, Bld: 80 mg/dL (ref 70–99)
Potassium: 3.4 mmol/L — ABNORMAL LOW (ref 3.5–5.1)
Sodium: 139 mmol/L (ref 135–145)

## 2021-08-14 LAB — CBC
HCT: 45.3 % (ref 36.0–46.0)
Hemoglobin: 14.7 g/dL (ref 12.0–15.0)
MCH: 29.3 pg (ref 26.0–34.0)
MCHC: 32.5 g/dL (ref 30.0–36.0)
MCV: 90.2 fL (ref 80.0–100.0)
Platelets: 165 10*3/uL (ref 150–400)
RBC: 5.02 MIL/uL (ref 3.87–5.11)
RDW: 14.3 % (ref 11.5–15.5)
WBC: 6.7 10*3/uL (ref 4.0–10.5)
nRBC: 0 % (ref 0.0–0.2)

## 2021-08-14 LAB — BRAIN NATRIURETIC PEPTIDE: B Natriuretic Peptide: 462 pg/mL — ABNORMAL HIGH (ref 0.0–100.0)

## 2021-08-14 LAB — TROPONIN I (HIGH SENSITIVITY)
Troponin I (High Sensitivity): 12 ng/L (ref <18)
Troponin I (High Sensitivity): 11 ng/L (ref <18)

## 2021-08-14 LAB — CBG MONITORING, ED: Glucose-Capillary: 112 mg/dL — ABNORMAL HIGH (ref 70–99)

## 2021-08-14 MED ORDER — ASPIRIN 81 MG PO CHEW
324.0000 mg | CHEWABLE_TABLET | Freq: Once | ORAL | Status: AC
  Administered 2021-08-14: 324 mg via ORAL
  Filled 2021-08-14: qty 4

## 2021-08-14 NOTE — ED Triage Notes (Signed)
Pov from home. Pt here for multiple complaints. Says that she has had pain behind her eyes for 2 days now that has caused double vision. Started last night. Checked her BP at home and it was 193/ 107 at home. Says that it makes it to where she doesn't want to open her eyes.  Thinks she has CHF. When asked about pain level she says "I dont know its tolerable. But it hurts to move"

## 2021-08-14 NOTE — Discharge Instructions (Addendum)
Make sure to take your Lasix regularly for the next 5 days.  Follow-up with your cardiologist closely this week to be rechecked

## 2021-08-14 NOTE — ED Provider Notes (Signed)
[facility] EMERGENCY DEPARTMENT Provider Note   CSN: [id number] Arrival date & time: 08/14/21  1918     History  Chief Complaint  Patient presents with   Hypertension    Lindsay Camacho is a 56 y.o. female.   Hypertension   Patient has a history of hypertension TIA, coronary artery disease, peripheral vascular disease, hyperlipidemia mitral regurgitation, heart failure, aortic occlusive disease chronic cigarette use.  Patient presents to the ED with complaints of chest pain, hypertension and blurred vision.  Patient states she has been having some intermittent chest discomfort the last couple of days.  She has had a pressure discomfort in her chest.  Pain increases with movement of her arms and movement of her back.  Patient states she also has been having some intermittent issues with double vision.  It occurred today lasting for about 5 minutes.  It has all resolved at this point.  Patient checked her blood pressure today and it was very elevated at 193/107.  This concerned her so she came to the ED for evaluation.    Home Medications Prior to Admission medications   Medication Sig Start Date End Date Taking? Authorizing Provider  acetaminophen (TYLENOL) 500 MG tablet Take 1,000 mg by mouth every 6 (six) hours as needed for headache (pain).    [provider]  albuterol (VENTOLIN HFA) 108 (90 Base) MCG/ACT inhaler Inhale into the lungs every 6 (six) hours as needed for wheezing or shortness of breath.    [provider]  albuterol (VENTOLIN HFA) 108 (90 Base) MCG/ACT inhaler Inhale 2 puffs into the lungs every 4 (four) hours as needed for wheezing or shortness of breath. 11/28/20   Terald Sleeper, MD  aspirin EC 81 MG tablet Take 81 mg by mouth daily.    [provider]  atorvastatin (LIPITOR) 80 MG tablet Take 1 tablet (80 mg total) by mouth daily. 09/07/20   Marinus Maw, MD  Bempedoic Acid (NEXLETOL) 180 MG TABS Take 180 mg by mouth daily. 02/11/20    Corky Crafts, MD  carvedilol (COREG) 6.25 MG tablet Take 1 tablet (6.25 mg total) by mouth 2 (two) times daily. 09/07/20   Marinus Maw, MD  clopidogrel (PLAVIX) 75 MG tablet Take 1 tablet (75 mg total) by mouth daily. 09/10/20   Marinus Maw, MD  ergocalciferol (VITAMIN D2) 1.25 MG (50000 UT) capsule Take 50,000 Units by mouth once a week.    [provider]  furosemide (LASIX) 40 MG tablet Take 1 tablet (40 mg total) by mouth daily as needed for fluid or edema (shortness of breath). 03/05/20   Graciella Freer, PA-C  losartan (COZAAR) 25 MG tablet Take 0.5 tablets (12.5 mg total) by mouth daily. 09/10/20 01/18/21  Marinus Maw, MD  methocarbamol (ROBAXIN) 500 MG tablet Take 1 tablet (500 mg total) by mouth every 8 (eight) hours as needed for muscle spasms. 02/27/20   Petrucelli, Samantha R, PA-C  pantoprazole (PROTONIX) 40 MG tablet Take 1 tablet (40 mg total) by mouth daily. Future refills need to be from PCP! 03/05/20   Graciella Freer, PA-C  potassium chloride (KLOR-CON) 10 MEQ tablet Take 2 tablets (20 mEq total) by mouth daily. 09/10/20   Marinus Maw, MD  SPIRIVA RESPIMAT 1.25 MCG/ACT AERS Take 2 puffs by mouth daily. 11/29/20   [provider]  spironolactone (ALDACTONE) 25 MG tablet Take 25 mg by mouth daily.    [provider]  Allergies    Aldomet [methyldopa], Brilinta [ticagrelor], Other, Coumadin [warfarin sodium], Eliquis [apixaban], Lisinopril, Trazodone, and Penicillins    Review of Systems   Review of Systems  Physical Exam Updated Vital Signs BP (!) 160/80   Pulse 72   Temp 98.7 F (37.1 C)   Resp (!) 21   Ht 1.549 m (5\' 1" )   Wt 45 kg   SpO2 97%   BMI 18.74 kg/m  Physical Exam Vitals and nursing note reviewed.  Constitutional:      General: She is not in acute distress.    Appearance: She is well-developed.  HENT:     Head: Normocephalic and atraumatic.     Right Ear: External ear normal.     Left Ear:  External ear normal.  Eyes:     General: No scleral icterus.       Right eye: No discharge.        Left eye: No discharge.     Conjunctiva/sclera: Conjunctivae normal.  Neck:     Trachea: No tracheal deviation.  Cardiovascular:     Rate and Rhythm: Normal rate and regular rhythm.     Comments: Pain with movement Pulmonary:     Effort: Pulmonary effort is normal. No respiratory distress.     Breath sounds: Normal breath sounds. No stridor. No wheezing or rales.  Abdominal:     General: Bowel sounds are normal. There is no distension.     Palpations: Abdomen is soft.     Tenderness: There is no abdominal tenderness. There is no guarding or rebound.  Musculoskeletal:        General: No tenderness or deformity.     Cervical back: Neck supple.  Skin:    General: Skin is warm and dry.     Findings: No rash.  Neurological:     General: No focal deficit present.     Mental Status: She is alert and oriented to person, place, and time.     Cranial Nerves: No cranial nerve deficit (no facial droop, extraocular movements intact, no slurred speech).     Sensory: No sensory deficit.     Motor: No abnormal muscle tone or seizure activity.     Coordination: Coordination normal.     Comments: No pronator drift bilateral upper extrem, able to hold both legs off bed for 5 seconds, sensation intact in all extremities, no visual field cuts, no left or right sided neglect, normal finger-nose exam bilaterally, no nystagmus noted  No facial droop, extraocular movements intact, tongue midline  Psychiatric:        Mood and Affect: Mood normal.     ED Results / Procedures / Treatments   Labs (all labs ordered are listed, but only abnormal results are displayed) Labs Reviewed  BASIC METABOLIC PANEL - Abnormal; Notable for the following components:      Result Value   Potassium 3.4 (*)    Calcium 8.6 (*)    All other components within normal limits  BRAIN NATRIURETIC PEPTIDE - Abnormal; Notable  for the following components:   B Natriuretic Peptide 462.0 (*)    All other components within normal limits  CBG MONITORING, ED - Abnormal; Notable for the following components:   Glucose-Capillary 112 (*)    All other components within normal limits  CBC  TROPONIN I (HIGH SENSITIVITY)  TROPONIN I (HIGH SENSITIVITY)    EKG EKG Interpretation  Date/Time:  Sunday August 14 2021 20:56:46 EDT Ventricular Rate:  66 PR Interval:  157 QRS Duration: 146 QT Interval:  482 QTC Calculation: 506 R Axis:   -62 Text Interpretation: Sinus rhythm Left bundle branch block No significant change since last tracing Confirmed by Linwood Dibbles [phone]) on 08/14/2021 9:30:06 PM  Radiology DG Chest Portable 1 View  Result Date: 08/14/2021 CLINICAL DATA:  Pain behind her eyes with double vision. EXAM: PORTABLE CHEST 1 VIEW COMPARISON:  November 28, 2020 FINDINGS: There is a multi lead AICD. The heart size and mediastinal contours are within normal limits. An artificial cardiac valve is seen. Both lungs are clear. Radiopaque surgical clips are seen along the lateral aspect of the upper right lung. The visualized skeletal structures are unremarkable. IMPRESSION: No active cardiopulmonary disease. Electronically Signed   By: Aram Candela M.D.   On: 08/14/2021 21:53   CT Head Wo Contrast  Result Date: 08/14/2021 CLINICAL DATA:  Posterior eye pain and double vision; hypertension EXAM: CT HEAD WITHOUT CONTRAST TECHNIQUE: Contiguous axial images were obtained from the base of the skull through the vertex without intravenous contrast. RADIATION DOSE REDUCTION: This exam was performed according to the departmental dose-optimization program which includes automated exposure control, adjustment of the mA and/or kV according to patient size and/or use of iterative reconstruction technique. COMPARISON:  CT head 09/19/2014 FINDINGS: Brain: No intracranial hemorrhage, mass effect, or evidence of acute infarct. No  hydrocephalus. No extra-axial fluid collection. Generalized cerebral atrophy. Ill-defined hypoattenuation within the cerebral white matter is nonspecific but consistent with chronic small vessel ischemic disease. Vascular: No hyperdense vessel. Calcification of the intracranial internal carotid arteries. Skull: No fracture or focal lesion. Sinuses/Orbits: No acute finding. Paranasal sinuses and mastoid air cells are well aerated. Other: None. IMPRESSION: No acute intracranial abnormality. Electronically Signed   By: Minerva Fester M.D.   On: 08/14/2021 21:50    Procedures Procedures    Medications Ordered in ED Medications  aspirin chewable tablet 324 mg (324 mg Oral Given 08/14/21 2200)    ED Course/ Medical Decision Making/ A&P Clinical Course as of 08/14/21 2356  Sun Aug 14, 2021  2202 Brain natriuretic peptide(!) BNP elevated 462 [JK]  2202 Troponin I (High Sensitivity) Normal [JK]  2202 CBC Normal [JK]  2202 Basic metabolic panel(!) Mild hypokalemia [JK]  2203 DG Chest Portable 1 View Chest x-ray and head CT without acute findings [JK]    Clinical Course User Index [JK] Linwood Dibbles, MD                           Medical Decision Making Amount and/or Complexity of Data Reviewed Labs: ordered. Decision-making details documented in ED Course. Radiology: ordered and independent interpretation performed. Decision-making details documented in ED Course.  Risk OTC drugs.   Patient presents to the ED for evaluation of chest pain, triple vision, hypertension.  Initial ED work-up reassuring.  No signs of cardiac ischemia.  Patient was also concerned about her blood pressure but it has been relatively well controlled here in the ED only lightly elevated.  No signs of pulmonary edema on chest x-ray.  Patient's neurologic exam is normal and the CT scan does not show any acute findings.  I have low suspicion for stroke TIA and did not feel that she requires further imaging such as  MRI.  We will check delta troponin if that is unchanged we will have her increase her Lasix for the next few days.  Close outpatient follow-up with her cardiologist.  Final Clinical Impression(s) / ED Diagnoses Final diagnoses:  Hypertension, unspecified type    Rx / DC Orders ED Discharge Orders     None         Linwood Dibbles, MD 08/14/21 2357

## 2021-08-15 ENCOUNTER — Telehealth: Payer: Self-pay | Admitting: Internal Medicine

## 2021-08-15 ENCOUNTER — Ambulatory Visit: Payer: Medicaid - Out of State

## 2021-08-15 NOTE — Telephone Encounter (Signed)
Pt called stating that her insurance is requesting that Dr. Ladona Ridgel send a statement to insurance company stating that she is chronic heart failure with a pacemaker and qualifies for [facility] Chronic Special Needs Plan  Fax number: [phone]

## 2021-08-16 LAB — CUP PACEART REMOTE DEVICE CHECK
Battery Remaining Longevity: 21 mo
Battery Voltage: 2.88 V
Brady Statistic AP VP Percent: 0.08 %
Brady Statistic AP VS Percent: 0.01 %
Brady Statistic AS VP Percent: 99.48 %
Brady Statistic AS VS Percent: 0.42 %
Brady Statistic RA Percent Paced: 0.1 %
Brady Statistic RV Percent Paced: 99.33 %
Date Time Interrogation Session: 20230813180525
HighPow Impedance: 62 Ohm
Implantable Lead Implant Date: 20190124
Implantable Lead Implant Date: 20190124
Implantable Lead Implant Date: 20190124
Implantable Lead Location: 753859
Implantable Lead Location: 753860
Implantable Lead Location: 753860
Implantable Lead Model: 3830
Implantable Lead Model: 5076
Implantable Lead Model: 6935
Implantable Pulse Generator Implant Date: 20190124
Lead Channel Impedance Value: 247 Ohm
Lead Channel Impedance Value: 304 Ohm
Lead Channel Impedance Value: 342 Ohm
Lead Channel Impedance Value: 437 Ohm
Lead Channel Impedance Value: 437 Ohm
Lead Channel Impedance Value: 494 Ohm
Lead Channel Pacing Threshold Amplitude: 0.625 V
Lead Channel Pacing Threshold Amplitude: 0.875 V
Lead Channel Pacing Threshold Amplitude: 1.125 V
Lead Channel Pacing Threshold Pulse Width: 0.4 ms
Lead Channel Pacing Threshold Pulse Width: 0.4 ms
Lead Channel Pacing Threshold Pulse Width: 0.5 ms
Lead Channel Sensing Intrinsic Amplitude: 1.25 mV
Lead Channel Sensing Intrinsic Amplitude: 1.25 mV
Lead Channel Sensing Intrinsic Amplitude: 7.875 mV
Lead Channel Sensing Intrinsic Amplitude: 7.875 mV
Lead Channel Setting Pacing Amplitude: 1.5 V
Lead Channel Setting Pacing Amplitude: 1.5 V
Lead Channel Setting Pacing Amplitude: 2.5 V
Lead Channel Setting Pacing Pulse Width: 0.4 ms
Lead Channel Setting Pacing Pulse Width: 0.5 ms
Lead Channel Setting Sensing Sensitivity: 0.3 mV

## 2021-08-16 NOTE — Telephone Encounter (Signed)
Refaxed request for chronic condition form to [facility] as requested.  This is the 3rd faxed request sent to [facility] requesting they resubmit this form to our office for completion.  Original completed form was faxed with confirmation receipt on May 24, 2021.  Left detailed message for Pt advising multiple outreaches to [facility] requesting a replacement form be faxed.  Await form.

## 2021-08-18 NOTE — Progress Notes (Signed)
Office Visit    Patient Name: Lindsay Camacho Date of Encounter: 08/19/2021  PCP:  System, Provider Not In   [place]  Cardiologist:  Larae Grooms, MD  Advanced Practice Provider:  No care team member to display Electrophysiologist:  Cristopher Peru, MD   HPI    Lindsay Camacho is a 56 y.o. female past medical history significant for hypertension, TIA, coronary artery disease, peripheral vascular disease, hyperlipidemia, mitral regurgitation, heart failure, aortic occlusive disease, chronic cigarette use presents today for hospital follow-up.  The patient was recently seen in the ED 08/15/2021 and had been having chest pain, hypertension, and blurred vision.  She has been having intermittent chest discomfort the last couple of days.  She described it as a pressure in her chest.  Pain increased with movement of her arms and movement of her back.  Patient states she also has been having some intermittent issues with double vision.  It occurred at the time of her ED visit and lasted about 5 minutes.  It resolved at that point.  She checked her blood pressure before her ED visit and it was elevated at 193/107.  Therefore, she came to the ED for further evaluation.  Her initial ED work-up was reassuring.  No signs of cardiac ischemia.  Blood pressure was relatively controlled in the ED and only slightly elevated.  No signs of pulmonary edema on chest x-ray.  Patient's neurological exam was normal.  Low suspicion for stroke, TIA.  Her Lasix was increased for a few days.  High-sensitivity troponin was unremarkable.  Today, she states that she is having chest pain that comes as a shock hurts little bit then goes away and then comes back.  She has not needed any nitro.  She states she cannot do a stress test and cardiac catheterization would be her only option if she need ischemic work-up.  Troponin was negative in the ED and her EKG was stable.  Her blood pressure was notably  elevated but is normal today at 122/80.  She is compliant with all her medications.  She does have a way to monitor blood pressure at home.  She states she is lost a lot of weight about 12 pounds over the last year or so.  She has not tried to lose weight.  She looks euvolemic on exam today.  She does have as needed Lasix to use if needed for lower extremity edema.  Reports no shortness of breath nor dyspnea on exertion. No edema, orthopnea, PND. Reports no palpitations.     Past Medical History    Past Medical History:  Diagnosis Date   Acute myocardial infarction of other lateral wall, initial episode of care 01/2013   DES CFX   Aortoiliac occlusive disease ([place]) 08/22/2016   CAD (coronary artery disease) 11/13/2011   50% ? proximal LCx stenosis per Cath at [facility]   Chest pain, atypical, muscular skelatal   01/09/2014   COVID 02/08/2020   Dyspnea    Heart failure (HCC)    HLD (hyperlipidemia)    Hypertension    Intermediate coronary syndrome (HCC)    LBBB (left bundle branch block)    Mitral regurgitation    Nonischemic cardiomyopathy ([place]) 01/26/2017   PVD (peripheral vascular disease) ([place])    Right CFA stenosis per report on 11/14/2011   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/25/2016   Edwards McArthy-Adams IMR ETLogix ring annuloplasty (Model 4100, Serial [id number], size 26) placed via right mini  thoracotomy approach   Stenosis of left subclavian artery (HCC) 08/22/2016   Stroke (HCC)    tia with no deficits   Past Surgical History:  Procedure Laterality Date   APPENDECTOMY     BIV ICD INSERTION CRT-D N/A 01/24/2017   Procedure: BIV ICD INSERTION CRT-D;  Surgeon: Marinus Maw, MD;  Location: [facility] INVASIVE CV LAB;  Service: Cardiovascular;  Laterality: N/A;   CARDIAC CATHETERIZATION  04/14/2013   Non-obstructive disease, patent CFX stent   CARDIAC CATHETERIZATION N/A 09/21/2014   Procedure: Left Heart Cath and Coronary Angiography;  Surgeon: Lennette Bihari, MD;  Location: [facility]  INVASIVE CV LAB;  Service: Cardiovascular;  Laterality: N/A;   CORONARY ANGIOPLASTY WITH STENT PLACEMENT  01/17/2013   mild disease except 99% CFX, rx with  2.5 x 28 Alpine drug-eluting stent    GROIN DEBRIDEMENT Right 04/14/2013   Procedure: Emergency Evacuation of Retroperitoneal Hematoma and Repair Right External Iliac Artery Pseudoaneurysm    ;  Surgeon: Sherren Kerns, MD;  Location: [facility] OR;  Service: Vascular;  Laterality: Right;   LEFT HEART CATHETERIZATION WITH CORONARY ANGIOGRAM N/A 01/18/2013   Procedure: LEFT HEART CATHETERIZATION WITH CORONARY ANGIOGRAM;  Surgeon: Corky Crafts, MD;  Location: [facility] CATH LAB;  Service: Cardiovascular;  Laterality: N/A;   LEFT HEART CATHETERIZATION WITH CORONARY ANGIOGRAM N/A 04/14/2013   Procedure: LEFT HEART CATHETERIZATION WITH CORONARY ANGIOGRAM;  Surgeon: Corky Crafts, MD;  [facility] CATH LAB;  Service: Cardiovascular;  Laterality: N/A;   MITRAL VALVE REPAIR Right 08/25/2016   Procedure: MINIMALLY INVASIVE MITRAL VALVE REPAIR;  Surgeon: Purcell Nails, MD;  Location: [facility] OR;  Service: Open Heart Surgery;  Laterality: Right;   MULTIPLE EXTRACTIONS WITH ALVEOLOPLASTY N/A 08/23/2016   Procedure: Extraction of tooth #'s 17, 20-23, 26-29 with alveoloplasty;  Surgeon: [name], DDS;  Location: [facility] OR;  Service: Oral Surgery;  Laterality: N/A;   PERCUTANEOUS CORONARY STENT INTERVENTION (PCI-S)  01/18/2013   Procedure: PERCUTANEOUS CORONARY STENT INTERVENTION (PCI-S);  Surgeon: Corky Crafts, MD;  Location: [facility] CATH LAB;  Service: Cardiovascular;;   RIGHT/LEFT HEART CATH AND CORONARY ANGIOGRAPHY N/A 08/21/2016   Procedure: RIGHT/LEFT HEART CATH AND CORONARY ANGIOGRAPHY;  Surgeon: Kathleene Hazel, MD;  Location: [facility] INVASIVE CV LAB;  Service: Cardiovascular;  Laterality: N/A;   TEE WITHOUT CARDIOVERSION N/A 03/06/2014   Procedure: TRANSESOPHAGEAL ECHOCARDIOGRAM (TEE);  Surgeon: Lars Masson, MD;  Location: [facility] ENDOSCOPY;   Service: Cardiovascular;  Laterality: N/A;   TEE WITHOUT CARDIOVERSION N/A 08/18/2016   Procedure: TRANSESOPHAGEAL ECHOCARDIOGRAM (TEE);  Surgeon: Lewayne Bunting, MD;  Location: [facility] ENDOSCOPY;  Service: Cardiovascular;  Laterality: N/A;   TEE WITHOUT CARDIOVERSION N/A 08/25/2016   Procedure: TRANSESOPHAGEAL ECHOCARDIOGRAM (TEE);  Surgeon: Purcell Nails, MD;  Location: [facility] OR;  Service: Open Heart Surgery;  Laterality: N/A;   TUBAL LIGATION      Allergies  Allergies  Allergen Reactions   Aldomet [Methyldopa] Other (See Comments)    Severe hypotension   Brilinta [Ticagrelor] Shortness Of Breath   Other Other (See Comments)    Nuclear Stress Test Medication caused seizures   Coumadin [Warfarin Sodium] Swelling    Arm swelled   Eliquis [Apixaban] Other (See Comments)    BP got too low   Entresto [Sacubitril-Valsartan]     hypotension   Lisinopril Other (See Comments)   Trazodone Other (See Comments)    Could not wake up from medication, comatosed   Penicillins Rash    Has patient had a PCN  reaction causing immediate rash, facial/tongue/throat swelling, SOB or lightheadedness with hypotension: Yes Has patient had a PCN reaction causing severe rash involving mucus membranes or skin necrosis: No Has patient had a PCN reaction that required hospitalization: No Has patient had a PCN reaction occurring within the last 10 years: No If all of the above answers are "NO", then may proceed with Cephalosporin use.    EKGs/Labs/Other Studies Reviewed:   The following studies were reviewed today:  Echocardiogram 03/31/2020 IMPRESSIONS     1. Well-functioning mitral valve annuloplasty ring. compared with the  echo [id number], systolic function ias imprved from 25% to 50-55%.   2. Left ventricular ejection fraction, by estimation, is 50 to 55%. The  left ventricle has low normal function. The left ventricle has no regional  wall motion abnormalities. There is mild concentric left ventricular   hypertrophy. Left ventricular  diastolic parameters are indeterminate. Elevated left atrial pressure. The  average left ventricular global longitudinal strain is -11.2 %. The global  longitudinal strain is abnormal.   3. Right ventricular systolic function is normal. The right ventricular  size is normal. There is normal pulmonary artery systolic pressure.   4. Left atrial size was mildly dilated.   5. The mitral valve has been repaired/replaced. Mild mitral valve  regurgitation. No evidence of mitral stenosis. The mean mitral valve  gradient is 2.0 mmHg. Procedure Date: 08/25/2016.   6. Tricuspid valve regurgitation is moderate.   7. The aortic valve is normal in structure. Aortic valve regurgitation is  not visualized. No aortic stenosis is present.   8. The inferior vena cava is normal in size with greater than 50%  respiratory variability, suggesting right atrial pressure of 3 mmHg.   FINDINGS   Left Ventricle: Left ventricular ejection fraction, by estimation, is 50  to 55%. The left ventricle has low normal function. The left ventricle has  no regional wall motion abnormalities. The average left ventricular global  longitudinal strain is -11.2  %. The global longitudinal strain is abnormal. The left ventricular  internal cavity size was normal in size. There is mild concentric left  ventricular hypertrophy. Left ventricular diastolic parameters are  indeterminate. Elevated left atrial pressure.   Right Ventricle: The right ventricular size is normal. No increase in  right ventricular wall thickness. Right ventricular systolic function is  normal. There is normal pulmonary artery systolic pressure. The tricuspid  regurgitant velocity is 2.46 m/s, and   with an assumed right atrial pressure of 3 mmHg, the estimated right  ventricular systolic pressure is XX123456 mmHg.   Left Atrium: Left atrial size was mildly dilated.   Right Atrium: Right atrial size was normal in size.    Pericardium: Trivial pericardial effusion is present.   Mitral Valve: The mitral valve has been repaired/replaced. Mild mitral  valve regurgitation. There is a 26 mm prosthetic annuloplasty ring present  in the mitral position. No evidence of mitral valve stenosis. MV peak  gradient, 5.9 mmHg. The mean mitral  valve gradient is 2.0 mmHg with average heart rate of 66 bpm.   Tricuspid Valve: The tricuspid valve is normal in structure. Tricuspid  valve regurgitation is moderate . No evidence of tricuspid stenosis.   Aortic Valve: The aortic valve is normal in structure. Aortic valve  regurgitation is not visualized. Aortic regurgitation PHT measures 423  msec. No aortic stenosis is present.   Pulmonic Valve: The pulmonic valve was normal in structure. Pulmonic valve  regurgitation is not visualized. No evidence of  pulmonic stenosis.   Aorta: The aortic root is normal in size and structure.   Venous: The inferior vena cava is normal in size with greater than 50%  respiratory variability, suggesting right atrial pressure of 3 mmHg.   IAS/Shunts: No atrial level shunt detected by color flow Doppler.   Additional Comments: A device lead is visualized.   EKG:  EKG is not ordered today.  Recent Labs: 09/07/2020: ALT 11 08/14/2021: B Natriuretic Peptide 462.0; BUN 16; Creatinine, Ser 0.77; Hemoglobin 14.7; Platelets 165; Potassium 3.4; Sodium 139  Recent Lipid Panel    Component Value Date/Time   CHOL 132 03/07/2019 0923   TRIG 62 03/07/2019 0923   HDL 40 03/07/2019 0923   CHOLHDL 3.3 03/07/2019 0923   CHOLHDL 3.6 07/23/2018 1017   VLDL 21 07/23/2018 1017   LDLCALC 79 03/07/2019 0923    Home Medications   Current Meds  Medication Sig   acetaminophen (TYLENOL) 500 MG tablet Take 1,000 mg by mouth every 6 (six) hours as needed for headache (pain).   aspirin EC 81 MG tablet Take 81 mg by mouth daily.   atorvastatin (LIPITOR) 80 MG tablet Take 1 tablet (80 mg total) by mouth  daily.   Bempedoic Acid (NEXLETOL) 180 MG TABS Take 180 mg by mouth daily.   carvedilol (COREG) 6.25 MG tablet Take 1 tablet (6.25 mg total) by mouth 2 (two) times daily.   clopidogrel (PLAVIX) 75 MG tablet Take 1 tablet (75 mg total) by mouth daily.   ergocalciferol (VITAMIN D2) 1.25 MG (50000 UT) capsule Take 50,000 Units by mouth once a week.   furosemide (LASIX) 40 MG tablet Take 1 tablet (40 mg total) by mouth daily as needed for fluid or edema (shortness of breath).   losartan (COZAAR) 25 MG tablet Take 0.5 tablets (12.5 mg total) by mouth daily.   methocarbamol (ROBAXIN) 500 MG tablet Take 1 tablet (500 mg total) by mouth every 8 (eight) hours as needed for muscle spasms.   pantoprazole (PROTONIX) 40 MG tablet Take 1 tablet (40 mg total) by mouth daily. Future refills need to be from PCP!   potassium chloride (KLOR-CON) 10 MEQ tablet Take 2 tablets (20 mEq total) by mouth daily.   SPIRIVA RESPIMAT 1.25 MCG/ACT AERS Take 2 puffs by mouth daily.   spironolactone (ALDACTONE) 25 MG tablet Take 25 mg by mouth daily.     Review of Systems      All other systems reviewed and are otherwise negative except as noted above.  Physical Exam    VS:  BP 122/80   Pulse 65   Ht 5\' 1"  (1.549 m)   Wt 100 lb 3.2 oz (45.5 kg)   SpO2 99%   BMI 18.93 kg/m  , BMI Body mass index is 18.93 kg/m.  Wt Readings from Last 3 Encounters:  08/19/21 100 lb 3.2 oz (45.5 kg)  08/14/21 99 lb 3.3 oz (45 kg)  03/01/21 98 lb 3.2 oz (44.5 kg)     GEN: Well nourished, well developed, in no acute distress. HEENT: normal. Neck: Supple, no JVD, carotid bruits, or masses. Cardiac: RRR, + systolic murmurs, rubs, or gallops. No clubbing, cyanosis, edema.  Radials/PT 2+ and equal bilaterally.  Respiratory:  Respirations regular and unlabored, clear to auscultation bilaterally. GI: Soft, nontender, nondistended. MS: No deformity or atrophy. Skin: Warm and dry, no rash. Neuro:  Strength and sensation are  intact. Psych: Normal affect.  Assessment & Plan    Hypertension -High hence why she was  in the ED -She is on losartan 12.5 mg daily, Coreg 6.25 mg twice daily -Of note, her blood pressure should always be checked in her right arm since she has left subclavian artery stenosis -She does have a way to check her blood pressure at home and she is encouraged to do so daily -She states it will go up and down significantly (she cannot take Entresto because of this)'s  Left subclavian artery stenosis -She had an ultrasound back in January and was referred to Dr. Fletcher Anon -She was offered surgery or surveillance and she went with surveillance for now -No BP in her left side  CAD s/p PCI -chest pain when in the ED in the setting of extreme hypertension -Troponin was flat and EKG was unremarkable -We will order an updated echocardiogram and if abnormal will then pursue an ischemic work-up  Chronic diastolic heart failure -s/p ICD insertion -Euvolemic on exam -Continue as needed Lasix for fluid overload -If you gain more than 2 pounds overnight or 5 pounds in a week please call our office -Weight has been stable (she actually lost 12 pounds in early January unintentionally)  Status post mitral valve repair -Minimally invasive mitral valve repair done by Dr. Roxy Manns back in 2018 -We will get updated echocardiogram due to murmur found on exam  Hyperlipidemia -We will need repeat lipid panel next time she is in the office  Tobacco abuse -nicotine patches ordered but insurance has been a problem        Disposition: Follow up 2-3 months with Larae Grooms, MD or APP.  Signed, Elgie Collard, PA-C 08/19/2021, 4:19 PM [facility]

## 2021-08-19 ENCOUNTER — Encounter: Payer: Self-pay | Admitting: Physician Assistant

## 2021-08-19 ENCOUNTER — Ambulatory Visit (INDEPENDENT_AMBULATORY_CARE_PROVIDER_SITE_OTHER): Payer: Medicare HMO | Admitting: Physician Assistant

## 2021-08-19 VITALS — BP 122/80 | HR 65 | Ht 61.0 in | Wt 100.2 lb

## 2021-08-19 DIAGNOSIS — I5032 Chronic diastolic (congestive) heart failure: Secondary | ICD-10-CM

## 2021-08-19 DIAGNOSIS — I25118 Atherosclerotic heart disease of native coronary artery with other forms of angina pectoris: Secondary | ICD-10-CM | POA: Diagnosis not present

## 2021-08-19 DIAGNOSIS — I5022 Chronic systolic (congestive) heart failure: Secondary | ICD-10-CM | POA: Diagnosis not present

## 2021-08-19 DIAGNOSIS — E785 Hyperlipidemia, unspecified: Secondary | ICD-10-CM | POA: Diagnosis not present

## 2021-08-19 DIAGNOSIS — I509 Heart failure, unspecified: Secondary | ICD-10-CM

## 2021-08-19 DIAGNOSIS — K219 Gastro-esophageal reflux disease without esophagitis: Secondary | ICD-10-CM

## 2021-08-19 DIAGNOSIS — Z9889 Other specified postprocedural states: Secondary | ICD-10-CM

## 2021-08-19 DIAGNOSIS — Z720 Tobacco use: Secondary | ICD-10-CM

## 2021-08-19 DIAGNOSIS — I771 Stricture of artery: Secondary | ICD-10-CM

## 2021-08-19 NOTE — Patient Instructions (Addendum)
Medication Instructions:  Your physician recommends that you continue on your current medications as directed. Please refer to the Current Medication list given to you today.  *If you need a refill on your cardiac medications before your next appointment, please call your pharmacy*   Lab Work: None If you have labs (blood work) drawn today and your tests are completely normal, you will receive your results only by: MyChart Message (if you have MyChart) OR A paper copy in the mail If you have any lab test that is abnormal or we need to change your treatment, we will call you to review the results.   Testing/Procedures: Your physician has requested that you have an echocardiogram. Echocardiography is a painless test that uses sound waves to create images of your heart. It provides your doctor with information about the size and shape of your heart and how well your heart's chambers and valves are working. This procedure takes approximately one hour. There are no restrictions for this procedure.    Follow-Up: At [facility], you and your health needs are our priority.  As part of our continuing mission to provide you with exceptional heart care, we have created designated Provider Care Teams.  These Care Teams include your primary Cardiologist (physician) and Advanced Practice Providers (APPs -  Physician Assistants and Nurse Practitioners) who all work together to provide you with the care you need, when you need it.  Your next appointment:   2-3 months  The format for your next appointment:   In Person  Provider:   Lance Muss, MD {  Other Instructions 1.Try to establish with a primary care provider  2.Your physician recommends that you weigh, daily, at the same time every day, and in the same amount of clothing. Please record your daily weights and if you gain 2-3 pounds or more overnight or 5 pounds or more in a week call and let us know  3.Your physician has requested  that you regularly monitor and record your blood pressure readings at home. Please use the same machine at the same time of day to check your readings and record them and send [place] the readings through MyChart or call us.    Make sure to check 1 hour after your morning medications.    AVOID these things for 30 minutes before checking your blood pressure: No Drinking caffeine. No Drinking alcohol. No Eating. No Smoking. No Exercising.   Five minutes before checking your blood pressure: Pee. Sit in a dining chair. Avoid sitting in a soft couch or armchair. Be quiet. Do not talk      Important Information About Sugar

## 2021-08-28 ENCOUNTER — Emergency Department (HOSPITAL_COMMUNITY): Payer: Medicare HMO

## 2021-08-28 ENCOUNTER — Encounter (HOSPITAL_COMMUNITY): Payer: Self-pay

## 2021-08-28 ENCOUNTER — Encounter (HOSPITAL_COMMUNITY): Payer: Self-pay | Admitting: *Deleted

## 2021-08-28 ENCOUNTER — Observation Stay (HOSPITAL_COMMUNITY)
Admission: EM | Admit: 2021-08-28 | Discharge: 2021-08-29 | Disposition: A | Discharge status: Home / Self Care | Payer: Medicare HMO | Attending: Internal Medicine | Admitting: Internal Medicine

## 2021-08-28 ENCOUNTER — Other Ambulatory Visit: Payer: Self-pay

## 2021-08-28 DIAGNOSIS — Z8673 Personal history of transient ischemic attack (TIA), and cerebral infarction without residual deficits: Secondary | ICD-10-CM | POA: Insufficient documentation

## 2021-08-28 DIAGNOSIS — F17210 Nicotine dependence, cigarettes, uncomplicated: Secondary | ICD-10-CM | POA: Insufficient documentation

## 2021-08-28 DIAGNOSIS — Z79899 Other long term (current) drug therapy: Secondary | ICD-10-CM | POA: Diagnosis not present

## 2021-08-28 DIAGNOSIS — Z955 Presence of coronary angioplasty implant and graft: Secondary | ICD-10-CM | POA: Diagnosis not present

## 2021-08-28 DIAGNOSIS — Z720 Tobacco use: Secondary | ICD-10-CM | POA: Diagnosis present

## 2021-08-28 DIAGNOSIS — I771 Stricture of artery: Principal | ICD-10-CM | POA: Insufficient documentation

## 2021-08-28 DIAGNOSIS — I10 Essential (primary) hypertension: Secondary | ICD-10-CM | POA: Diagnosis present

## 2021-08-28 DIAGNOSIS — E785 Hyperlipidemia, unspecified: Secondary | ICD-10-CM | POA: Diagnosis not present

## 2021-08-28 DIAGNOSIS — Z7902 Long term (current) use of antithrombotics/antiplatelets: Secondary | ICD-10-CM | POA: Diagnosis not present

## 2021-08-28 DIAGNOSIS — Z7982 Long term (current) use of aspirin: Secondary | ICD-10-CM | POA: Diagnosis not present

## 2021-08-28 DIAGNOSIS — I2510 Atherosclerotic heart disease of native coronary artery without angina pectoris: Secondary | ICD-10-CM | POA: Insufficient documentation

## 2021-08-28 DIAGNOSIS — R079 Chest pain, unspecified: Secondary | ICD-10-CM | POA: Diagnosis present

## 2021-08-28 DIAGNOSIS — I340 Nonrheumatic mitral (valve) insufficiency: Secondary | ICD-10-CM | POA: Diagnosis present

## 2021-08-28 DIAGNOSIS — M25512 Pain in left shoulder: Secondary | ICD-10-CM | POA: Diagnosis present

## 2021-08-28 DIAGNOSIS — I110 Hypertensive heart disease with heart failure: Secondary | ICD-10-CM | POA: Insufficient documentation

## 2021-08-28 DIAGNOSIS — I5022 Chronic systolic (congestive) heart failure: Secondary | ICD-10-CM

## 2021-08-28 DIAGNOSIS — Z95810 Presence of automatic (implantable) cardiac defibrillator: Secondary | ICD-10-CM | POA: Diagnosis present

## 2021-08-28 HISTORY — DX: Presence of automatic (implantable) cardiac defibrillator: Z95.810

## 2021-08-28 LAB — COMPREHENSIVE METABOLIC PANEL
ALT: 15 U/L (ref 0–44)
AST: 23 U/L (ref 15–41)
Albumin: 3.8 g/dL (ref 3.5–5.0)
Alkaline Phosphatase: 91 U/L (ref 38–126)
Anion gap: 7 (ref 5–15)
BUN: 9 mg/dL (ref 6–20)
CO2: 25 mmol/L (ref 22–32)
Calcium: 8.8 mg/dL — ABNORMAL LOW (ref 8.9–10.3)
Chloride: 108 mmol/L (ref 98–111)
Creatinine, Ser: 0.69 mg/dL (ref 0.44–1.00)
GFR, Estimated: >60 mL/min (ref >60)
Glucose, Bld: 79 mg/dL (ref 70–99)
Potassium: 3.6 mmol/L (ref 3.5–5.1)
Sodium: 140 mmol/L (ref 135–145)
Total Bilirubin: 1 mg/dL (ref 0.3–1.2)
Total Protein: 7.3 g/dL (ref 6.5–8.1)

## 2021-08-28 LAB — CBC WITH DIFFERENTIAL/PLATELET
Abs Immature Granulocytes: 0.03 10*3/uL (ref 0.00–0.07)
Basophils Absolute: 0.1 10*3/uL (ref 0.0–0.1)
Basophils Relative: 1 %
Eosinophils Absolute: 0.2 10*3/uL (ref 0.0–0.5)
Eosinophils Relative: 3 %
HCT: 50.8 % — ABNORMAL HIGH (ref 36.0–46.0)
Hemoglobin: 16.5 g/dL — ABNORMAL HIGH (ref 12.0–15.0)
Immature Granulocytes: 1 %
Lymphocytes Relative: 24 %
Lymphs Abs: 1.6 10*3/uL (ref 0.7–4.0)
MCH: 28.9 pg (ref 26.0–34.0)
MCHC: 32.5 g/dL (ref 30.0–36.0)
MCV: 89 fL (ref 80.0–100.0)
Monocytes Absolute: 0.5 10*3/uL (ref 0.1–1.0)
Monocytes Relative: 8 %
Neutro Abs: 4.3 10*3/uL (ref 1.7–7.7)
Neutrophils Relative %: 63 %
Platelets: 199 10*3/uL (ref 150–400)
RBC: 5.71 MIL/uL — ABNORMAL HIGH (ref 3.87–5.11)
RDW: 14.1 % (ref 11.5–15.5)
WBC: 6.6 10*3/uL (ref 4.0–10.5)
nRBC: 0 % (ref 0.0–0.2)

## 2021-08-28 LAB — TROPONIN I (HIGH SENSITIVITY)
Troponin I (High Sensitivity): 10 ng/L (ref <18)
Troponin I (High Sensitivity): 10 ng/L (ref <18)

## 2021-08-28 LAB — HEPARIN LEVEL (UNFRACTIONATED): Heparin Unfractionated: 0.87 IU/mL — ABNORMAL HIGH (ref 0.30–0.70)

## 2021-08-28 MED ORDER — LOSARTAN POTASSIUM 25 MG PO TABS
12.5000 mg | ORAL_TABLET | Freq: Once | ORAL | Status: AC
Start: 2021-08-28 — End: 2021-08-28
  Administered 2021-08-28: 12.5 mg via ORAL
  Filled 2021-08-28: qty 1

## 2021-08-28 MED ORDER — HEPARIN BOLUS VIA INFUSION
3000.0000 [IU] | Freq: Once | INTRAVENOUS | Status: AC
  Administered 2021-08-28: 3000 [IU] via INTRAVENOUS

## 2021-08-28 MED ORDER — ONDANSETRON HCL 4 MG/2ML IJ SOLN: 4.0000 mg | Freq: Four times a day (QID) | INTRAMUSCULAR | Status: DC | PRN

## 2021-08-28 MED ORDER — ACETAMINOPHEN 325 MG PO TABS
650.0000 mg | ORAL_TABLET | Freq: Four times a day (QID) | ORAL | Status: DC | PRN
  Administered 2021-08-28: 650 mg via ORAL
  Filled 2021-08-28 (×2): qty 2

## 2021-08-28 MED ORDER — HEPARIN (PORCINE) 25000 UT/250ML-% IV SOLN
500.0000 [IU]/h | INTRAVENOUS | Status: DC
  Administered 2021-08-28: 750 [IU]/h via INTRAVENOUS
  Filled 2021-08-28: qty 250

## 2021-08-28 MED ORDER — ATORVASTATIN CALCIUM 80 MG PO TABS
80.0000 mg | ORAL_TABLET | Freq: Every day | ORAL | Status: DC
  Administered 2021-08-28 – 2021-08-29 (×2): 80 mg via ORAL
  Filled 2021-08-28: qty 2
  Filled 2021-08-28: qty 1

## 2021-08-28 MED ORDER — HYDROCODONE-ACETAMINOPHEN 5-325 MG PO TABS
1.0000 | ORAL_TABLET | Freq: Four times a day (QID) | ORAL | Status: DC | PRN
  Administered 2021-08-28: 1 via ORAL
  Filled 2021-08-28: qty 1

## 2021-08-28 MED ORDER — SPIRONOLACTONE 25 MG PO TABS
25.0000 mg | ORAL_TABLET | Freq: Every day | ORAL | Status: DC
  Administered 2021-08-29: 25 mg via ORAL
  Filled 2021-08-28: qty 1

## 2021-08-28 MED ORDER — CARVEDILOL 6.25 MG PO TABS
6.2500 mg | ORAL_TABLET | Freq: Two times a day (BID) | ORAL | Status: DC
  Administered 2021-08-28 – 2021-08-29 (×2): 6.25 mg via ORAL
  Filled 2021-08-28: qty 1
  Filled 2021-08-28: qty 2

## 2021-08-28 MED ORDER — BEMPEDOIC ACID 180 MG PO TABS: 180.0000 mg | ORAL_TABLET | Freq: Every day | ORAL | Status: DC

## 2021-08-28 MED ORDER — FUROSEMIDE 40 MG PO TABS
40.0000 mg | ORAL_TABLET | Freq: Every day | ORAL | Status: DC | PRN
Start: 2021-08-28 — End: 2021-08-29

## 2021-08-28 MED ORDER — ONDANSETRON HCL 4 MG PO TABS: 4.0000 mg | ORAL_TABLET | Freq: Four times a day (QID) | ORAL | Status: DC | PRN

## 2021-08-28 MED ORDER — PANTOPRAZOLE SODIUM 40 MG PO TBEC
40.0000 mg | DELAYED_RELEASE_TABLET | Freq: Every day | ORAL | Status: DC
  Administered 2021-08-29: 40 mg via ORAL
  Filled 2021-08-28: qty 1

## 2021-08-28 MED ORDER — UMECLIDINIUM BROMIDE 62.5 MCG/ACT IN AEPB: 1.0000 | INHALATION_SPRAY | Freq: Every day | RESPIRATORY_TRACT | Status: DC

## 2021-08-28 MED ORDER — LOSARTAN POTASSIUM 25 MG PO TABS
12.5000 mg | ORAL_TABLET | Freq: Every day | ORAL | Status: DC
Start: 2021-08-28 — End: 2021-08-29
  Filled 2021-08-28: qty 1

## 2021-08-28 MED ORDER — IOHEXOL 350 MG/ML SOLN
100.0000 mL | Freq: Once | INTRAVENOUS | Status: AC | PRN
  Administered 2021-08-28: 100 mL via INTRAVENOUS

## 2021-08-28 MED ORDER — ACETAMINOPHEN 650 MG RE SUPP: 650.0000 mg | Freq: Four times a day (QID) | RECTAL | Status: DC | PRN

## 2021-08-28 NOTE — ED Provider Notes (Signed)
[facility] EMERGENCY DEPARTMENT Provider Note   CSN: [id number] Arrival date & time: 08/28/21  7628     History  Chief Complaint  Patient presents with   Arm Pain    Lindsay Camacho is a 56 y.o. female.   Arm Pain   56 year old female presents emergency department with complaints of generalized weakness as well as left shoulder/left arm pain and numbness.  She states that symptoms began after awakening this morning.  She has known left subclavian stenosis but states she has not had symptoms like this prior.  She states that her left extremity has felt cooler to the touch than her right.  She denies weakness and associated arm.  He states that pain is upper with movement of the arm as well as with touch.  She states that the pain and numbness is isolated to her left shoulder down to her left elbow but did not involve her left forearm or hand.  She denies any known trauma to affected area.  She reports taking her blood pressure earlier this morning and it was greater than 180 systolic prompting her visit to the emergency department.  She states that she took her Coreg this morning but not for losartan.  She denies fever, chills, night sweats, cough, congestion, change in vision, slurred speech, facial droop, difficulty walking, weakness/sensory deficits elsewhere, headache, dizziness, lightheadedness, chest pain, shortness of breath, abdominal pain, nausea, vomiting.  Past medical history significant for left subclavian artery stenosis, nonischemic cardiomyopathy, CAD, CVA, MI, mitral valve repair, hypertension, hyperlipidemia, PVD, CHF, ICD placement,  Home Medications Prior to Admission medications   Medication Sig Start Date End Date Taking? Authorizing Provider  aspirin EC 81 MG tablet Take 81 mg by mouth daily.   Yes [provider]  Bempedoic Acid (NEXLETOL) 180 MG TABS Take 180 mg by mouth daily. 02/11/20  Yes Corky Crafts, MD  carvedilol (COREG) 6.25 MG tablet Take 1  tablet (6.25 mg total) by mouth 2 (two) times daily. 09/07/20  Yes Marinus Maw, MD  clopidogrel (PLAVIX) 75 MG tablet Take 1 tablet (75 mg total) by mouth daily. 09/10/20  Yes Marinus Maw, MD  ergocalciferol (VITAMIN D2) 1.25 MG (50000 UT) capsule Take 50,000 Units by mouth once a week.   Yes [provider]  furosemide (LASIX) 40 MG tablet Take 1 tablet (40 mg total) by mouth daily as needed for fluid or edema (shortness of breath). 03/05/20  Yes Graciella Freer, PA-C  methocarbamol (ROBAXIN) 500 MG tablet Take 1 tablet (500 mg total) by mouth every 8 (eight) hours as needed for muscle spasms. 02/27/20  Yes Petrucelli, Samantha R, PA-C  pantoprazole (PROTONIX) 40 MG tablet Take 1 tablet (40 mg total) by mouth daily. Future refills need to be from PCP! 03/05/20  Yes Tillery, Mariam Dollar, PA-C  potassium chloride (KLOR-CON) 10 MEQ tablet Take 2 tablets (20 mEq total) by mouth daily. 09/10/20  Yes Marinus Maw, MD  SPIRIVA RESPIMAT 1.25 MCG/ACT AERS Take 2 puffs by mouth daily. 11/29/20  Yes [provider]  spironolactone (ALDACTONE) 25 MG tablet Take 25 mg by mouth daily.   Yes [provider]  acetaminophen (TYLENOL) 500 MG tablet Take 1,000 mg by mouth every 6 (six) hours as needed for headache (pain).    [provider]  atorvastatin (LIPITOR) 80 MG tablet Take 1 tablet (80 mg total) by mouth daily. 09/07/20   Marinus Maw, MD  losartan (COZAAR) 25 MG tablet Take 0.5  tablets (12.5 mg total) by mouth daily. 09/10/20 08/19/21  Marinus Maw, MD      Allergies    Aldomet [methyldopa], Brilinta [ticagrelor], Other, Coumadin [warfarin sodium], Eliquis [apixaban], Entresto [sacubitril-valsartan], Lisinopril, Trazodone, and Penicillins    Review of Systems   Review of Systems  All other systems reviewed and are negative.   Physical Exam Updated Vital Signs BP 136/62 (BP Location: Right Arm)   Pulse 65   Temp (!) 97.5 F (36.4 C) (Axillary)   Resp  (!) 21   Ht 5\' 1"  (1.549 m)   Wt 45.5 kg   SpO2 98%   BMI 18.93 kg/m  Physical Exam Vitals and nursing note reviewed.  Constitutional:      General: She is not in acute distress.    Appearance: She is well-developed.  HENT:     Head: Normocephalic and atraumatic.  Eyes:     Extraocular Movements: Extraocular movements intact.     Conjunctiva/sclera: Conjunctivae normal.     Pupils: Pupils are equal, round, and reactive to light.  Cardiovascular:     Rate and Rhythm: Normal rate and regular rhythm.     Comments: Grade 1 systolic murmur noted at left base of heart.  Left radial pulse delayed compared to right. Pulmonary:     Effort: Pulmonary effort is normal. No respiratory distress.     Breath sounds: Normal breath sounds.  Abdominal:     Palpations: Abdomen is soft.     Tenderness: There is no abdominal tenderness.  Musculoskeletal:        General: No swelling.     Cervical back: Neck supple.     Comments: Patient has full active range of motion of left shoulder, left elbow, left wrist, left digits without difficulty.  She has some tenderness to palpation along left trapezius, left deltoid and left bicep/tricep.  Extremity is not obviously cool to the touch than the right but patient has had extremity wrapped up in blanket.  No obvious ischemic changes to patient's left digits or distal arm.  Skin:    General: Skin is warm and dry.     Capillary Refill: Capillary refill takes less than 2 seconds.  Neurological:     Mental Status: She is alert.     Comments: Alert and oriented to self, place, time and event.   Speech is fluent, clear without dysarthria or dysphasia.   Strength symmetric in upper/lower extremities   Sensation intact in upper/lower extremities besides left upper arm.  Patient complains of numbness isolated to left shoulder extending down to left elbow.  Distal to said area, sensation intact along major nerve distributions. Negative Romberg. No pronator drift.   Normal finger-to-nose and feet tapping.  CN I not tested  CN II grossly intact visual fields bilaterally. Did not visualize posterior eye.  CN III, IV, VI PERRLA and EOMs intact bilaterally  CN V Intact sensation to sharp and light touch to the face  CN VII facial movements symmetric  CN VIII not tested  CN IX, X no uvula deviation, symmetric rise of soft palate  CN XI 5/5 SCM and trapezius strength bilaterally  CN XII Midline tongue protrusion, symmetric L/R movements     Psychiatric:        Mood and Affect: Mood normal.     ED Results / Procedures / Treatments   Labs (all labs ordered are listed, but only abnormal results are displayed) Labs Reviewed  COMPREHENSIVE METABOLIC PANEL - Abnormal; Notable for the following  components:      Result Value   Calcium 8.8 (*)    All other components within normal limits  CBC WITH DIFFERENTIAL/PLATELET - Abnormal; Notable for the following components:   RBC 5.71 (*)    Hemoglobin 16.5 (*)    HCT 50.8 (*)    All other components within normal limits  HEPARIN LEVEL (UNFRACTIONATED)  HIV ANTIBODY (ROUTINE TESTING W REFLEX)  HEPARIN LEVEL (UNFRACTIONATED)  CBC  BASIC METABOLIC PANEL  TROPONIN I (HIGH SENSITIVITY)  TROPONIN I (HIGH SENSITIVITY)    EKG EKG Interpretation  Date/Time:  Sunday August 28 2021 08:59:01 EDT Ventricular Rate:  71 PR Interval:  148 QRS Duration: 132 QT Interval:  471 QTC Calculation: 512 R Axis:   -83 Text Interpretation: Atrial-sensed ventricular-paced rhythm No further analysis attempted due to paced rhythm No significant change since last tracing Confirmed by Jacalyn Lefevre [phone]) on 08/28/2021 9:13:28 AM  Radiology CT ANGIO CHEST AORTA W/CM &/OR WO/CM  Result Date: 08/28/2021 CLINICAL DATA:  Pain left upper extremity, evaluate left subclavian artery EXAM: CT ANGIOGRAPHY CHEST WITH CONTRAST TECHNIQUE: Multidetector CT imaging of the chest was performed using the standard protocol during bolus  administration of intravenous contrast. Multiplanar CT image reconstructions and MIPs were obtained to evaluate the vascular anatomy. RADIATION DOSE REDUCTION: This exam was performed according to the departmental dose-optimization program which includes automated exposure control, adjustment of the mA and/or kV according to patient size and/or use of iterative reconstruction technique. CONTRAST:  OMNIPAQUE IOHEXOL 350 MG/ML SOLN COMPARISON:  Previous studies including examination done on 02/27/2020 FINDINGS: Cardiovascular: Scattered coronary artery calcifications are seen. Heart is enlarged in size. There is metallic density in the region of mitral annulus, possibly suggesting previous intervention. There are scattered atherosclerotic plaques and calcifications seen thoracic aorta and upper abdominal aorta. There is severe short-segment stenosis in the proximal course of left subclavian artery with 80-99% narrowing. Rest of the visualized portions of left subclavian artery is patent without other foci of narrowing. There are no intraluminal filling defects in pulmonary artery branches. Mediastinum/Nodes: No significant lymphadenopathy is seen. Lungs/Pleura: There is no focal pulmonary consolidation. Small linear density seen right lower lung field may suggest scarring or subsegmental atelectasis. There is no pleural effusion or pneumothorax. Upper Abdomen: Severe atherosclerotic changes are noted in the upper abdominal aorta with large atherosclerotic plaques and possible thrombi in the margin of the lumen. There is fatty infiltration in liver. Musculoskeletal: Degenerative changes are noted in lower cervical spine with encroachment of neural foramina. Review of the MIP images confirms the above findings. IMPRESSION: There is short segment high-grade stenosis in the proximal course of left subclavian artery with 80-99% stenosis. There is no evidence of thoracic aortic dissection. There is no evidence of  pulmonary artery embolism. Coronary artery calcifications are seen. Severe atherosclerotic changes are noted in the visualized upper abdominal aorta. Fatty liver. Cervical spondylosis. Electronically Signed   By: Ernie Avena M.D.   On: 08/28/2021 11:26   DG Chest Port 1 View  Result Date: 08/28/2021 CLINICAL DATA:  56 year old female with history of chest pain. EXAM: PORTABLE CHEST 1 VIEW COMPARISON:  Chest x-ray 08/14/2021. FINDINGS: Lung volumes are normal. No consolidative airspace disease. No pleural effusions. No pneumothorax. No pulmonary nodule or mass noted. Pulmonary vasculature and the cardiomediastinal silhouette are within normal limits. Status post mitral annuloplasty. Left-sided pacemaker/AICD device in place with lead tips projecting over the expected location of the right atrium and right ventricle. IMPRESSION: 1. No radiographic evidence  of acute cardiopulmonary disease. Electronically Signed   By: Trudie Reed M.D.   On: 08/28/2021 09:40    Procedures Procedures    Medications Ordered in ED Medications  heparin ADULT infusion 100 units/mL (25000 units/270mL) (750 Units/hr Intravenous New Bag/Given 08/28/21 1233)  atorvastatin (LIPITOR) tablet 80 mg (80 mg Oral Given 08/28/21 1435)  Bempedoic Acid TABS 180 mg (180 mg Oral Not Given 08/28/21 1759)  carvedilol (COREG) tablet 6.25 mg (6.25 mg Oral Not Given 08/28/21 1759)  furosemide (LASIX) tablet 40 mg (has no administration in time range)  losartan (COZAAR) tablet 12.5 mg (12.5 mg Oral Not Given 08/28/21 1759)  spironolactone (ALDACTONE) tablet 25 mg (25 mg Oral Not Given 08/28/21 1800)  pantoprazole (PROTONIX) EC tablet 40 mg (40 mg Oral Not Given 08/28/21 1759)  umeclidinium bromide (INCRUSE ELLIPTA) 62.5 MCG/ACT 1 puff (2 puffs Oral Not Given 08/28/21 1800)  acetaminophen (TYLENOL) tablet 650 mg (650 mg Oral Given 08/28/21 1751)    Or  acetaminophen (TYLENOL) suppository 650 mg ( Rectal See Alternative 08/28/21 1751)   ondansetron (ZOFRAN) tablet 4 mg (has no administration in time range)    Or  ondansetron (ZOFRAN) injection 4 mg (has no administration in time range)  HYDROcodone-acetaminophen (NORCO/VICODIN) 5-325 MG per tablet 1 tablet (1 tablet Oral Given 08/28/21 1837)  losartan (COZAAR) tablet 12.5 mg (12.5 mg Oral Given 08/28/21 0946)  iohexol (OMNIPAQUE) 350 MG/ML injection 100 mL (100 mLs Intravenous Contrast Given 08/28/21 1054)  heparin bolus via infusion 3,000 Units (3,000 Units Intravenous Bolus from Bag 08/28/21 1234)    ED Course/ Medical Decision Making/ A&P Clinical Course as of 08/28/21 1853  Sun Aug 28, 2021  1230 Consulted Dr. Theotis Barrio of cardiology.  He agreed with admission of the patient to [facility] where he will perform procedure on Monday.  He advised beginning heparin for anticoagulation until then. [CR]  1319 Consulted Dr. Arbutus Leas of hospital medicine.  He agreed with admission of the patient assuming further treatment/care. [CR]    Clinical Course User Index [CR] Peter Garter, PA                           Medical Decision Making Amount and/or Complexity of Data Reviewed Labs: ordered. Radiology: ordered.  Risk Prescription drug management. Decision regarding hospitalization.   This patient presents to the ED for concern of left arm pain, this involves an extensive number of treatment options, and is a complaint that carries with it a high risk of complications and morbidity.  The differential diagnosis includes thoracic outlet syndrome, subclavian steal, subclavian artery stenosis, CVA, strain/sprain, fracture, cervical radiculopathy   Co morbidities that complicate the patient evaluation  See HPI   Additional history obtained:  Additional history obtained from EMR External records from outside source obtained and reviewed including previous office visit for similar symptoms from 03/01/2021   Lab Tests:  I Ordered, and personally interpreted labs.  The pertinent  results include: No leukocytosis noted.  Mild elevation in hemoglobin of 16.5.  No electrolyte abnormalities.  Renal function within normal limits.  No transaminitis noted.   Imaging Studies ordered:  I ordered imaging studies including CT angio chest, DG chest I independently visualized and interpreted imaging which showed  CT angio chest: Short segment of high-grade stenosis of the proximal course of the left subclavian artery with 80 to 99% stenosis.  No evidence of dissection.  No evidence of PE.  Coronary artery calcifications.  Severe atherosclerotic changes  in the upper abdominal aorta.  Fatty liver.  Cervical spondylosis. I agree with the radiologist interpretation  Cardiac Monitoring: / EKG:  The patient was maintained on a cardiac monitor.  I personally viewed and interpreted the cardiac monitored which showed an underlying rhythm of: Atrial sensed ventricularly paced rhythm with no evidence of ischemia.   Consultations Obtained:  See ED course  Problem List / ED Course / Critical interventions / Medication management  Left subclavian artery stenosis I ordered medication including Norco for pain.  Heparin for anticoagulation.  Losartan for continued on home antihypertensive therapy.    Reevaluation of the patient after these medicines showed that the patient improved I have reviewed the patients home medicines and have made adjustments as needed   Social Determinants of Health:  Current cigarette use.  Denies illicit drug use.   Test / Admission - Considered:  Left subclavian artery stenosis Vitals signs  within normal range and stable throughout visit. Laboratory/imaging studies significant for: See above Patient symptoms likely secondary to subclavian artery stenosis as indicated above and imaging studies.  This confirmed clinical suspicion of known stenotic vessel.  Patient to be admitted to hospital with anticoagulation.  Proper consultations were made as depicted  in ED course.  Treatment plan was discussed at length with patient and family and they acknowledge understanding agreeable to said plan.  Patient was stable upon admission.        Final Clinical Impression(s) / ED Diagnoses Final diagnoses:  Subclavian artery stenosis, left [facility])    Rx / DC Orders ED Discharge Orders     None         Peter Garter, [place] 08/28/21 Lynelle Smoke    Jacalyn Lefevre, MD 08/29/21 [phone]

## 2021-08-28 NOTE — ED Triage Notes (Signed)
Pt c/o pain that started in her left arm and radiating to her left upper back that started this morning suddenly. Denies chest pain. Pt reports she woke up this morning feeling weak and nauseated and took her BP and it was 189/101. Denies nausea currently.

## 2021-08-28 NOTE — Progress Notes (Signed)
ANTICOAGULATION CONSULT NOTE - Follow up Consult  Pharmacy Consult for heparin gtt  Indication:  PAD/DVT suspect  Allergies  Allergen Reactions   Aldomet [Methyldopa] Other (See Comments)    Severe hypotension   Brilinta [Ticagrelor] Shortness Of Breath   Other Other (See Comments)    Nuclear Stress Test Medication caused seizures   Coumadin [Warfarin Sodium] Swelling    Arm swelled   Eliquis [Apixaban] Other (See Comments)    BP got too low   Entresto [Sacubitril-Valsartan]     hypotension   Lisinopril Other (See Comments)   Trazodone Other (See Comments)    Could not wake up from medication, comatosed   Penicillins Rash    Has patient had a PCN reaction causing immediate rash, facial/tongue/throat swelling, SOB or lightheadedness with hypotension: Yes Has patient had a PCN reaction causing severe rash involving mucus membranes or skin necrosis: No Has patient had a PCN reaction that required hospitalization: No Has patient had a PCN reaction occurring within the last 10 years: No If all of the above answers are "NO", then may proceed with Cephalosporin use.     Patient Measurements: Height: 5\' 1"  (154.9 cm) Weight: 45.5 kg (100 lb 3.2 oz) IBW/kg (Calculated) : 47.8 Heparin Dosing Weight: HEPARIN DW (KG): 45.5   Vital Signs: Temp: 97.5 F (36.4 C) (08/27 1700) Temp Source: Axillary (08/27 1700) BP: 136/62 (08/27 1700) Pulse Rate: 65 (08/27 1700)  Labs: Recent Labs    08/28/21 0923 08/28/21 1838  HGB 16.5*  --   HCT 50.8*  --   PLT 199  --   HEPARINUNFRC  --  0.87*  CREATININE 0.69  --      Estimated Creatinine Clearance: 56.4 mL/min (by C-G formula based on SCr of 0.69 mg/dL).   Medical History: Past Medical History:  Diagnosis Date   Acute myocardial infarction of other lateral wall, initial episode of care 01/2013   DES CFX   Aortoiliac occlusive disease (HCC) 08/22/2016   CAD (coronary artery disease) 11/13/2011   50% ? proximal LCx stenosis per  Cath at [facility]   Chest pain, atypical, muscular skelatal   01/09/2014   COVID 02/08/2020   Dyspnea    Heart failure (HCC)    HLD (hyperlipidemia)    Hypertension    Intermediate coronary syndrome (HCC)    LBBB (left bundle branch block)    Mitral regurgitation    Nonischemic cardiomyopathy (HCC) 01/26/2017   Presence of combination internal cardiac defibrillator (ICD) and pacemaker    PVD (peripheral vascular disease) (HCC)    Right CFA stenosis per report on 11/14/2011   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/25/2016   Edwards McArthy-Adams IMR ETLogix ring annuloplasty (Model 4100, Serial 08/27/2016, size 26) placed via right mini thoracotomy approach   Stenosis of left subclavian artery (HCC) 08/22/2016   Stroke (HCC)    tia with no deficits    Medications:  (Not in a hospital admission) Scheduled:   atorvastatin  80 mg Oral Daily   Bempedoic Acid  180 mg Oral Daily   carvedilol  6.25 mg Oral BID   losartan  12.5 mg Oral Daily   pantoprazole  40 mg Oral Daily   spironolactone  25 mg Oral Daily   umeclidinium bromide  1 puff Inhalation Daily   Infusions:   heparin 750 Units/hr (08/28/21 1233)   PRN:  Anti-infectives (From admission, onward)    None       Assessment: 08/30/21 a 56 y.o. female requires  anticoagulation with a heparin iv infusion for the indication of  DVT and PAD . Heparin gtt will be started following pharmacy protocol per pharmacy consult. Patient is not on previous oral anticoagulant that will require aPTT/HL correlation before transitioning to only HL monitoring.   HL 0.87, supratherapeutic  Goal of Therapy:  Heparin level 0.3-0.7 units/ml Monitor platelets by anticoagulation protocol: Yes   Plan:  Reduce heparin infusion to 5000 units/hr Check anti-Xa level in 6 hours and daily while on heparin Continue to monitor H&H and platelets  Gerre Pebbles Cordelia Bessinger 08/28/2021,7:48 PM

## 2021-08-28 NOTE — Assessment & Plan Note (Signed)
Tobacco cessation discussed 

## 2021-08-28 NOTE — Progress Notes (Signed)
ANTICOAGULATION CONSULT NOTE - Initial Consult  Pharmacy Consult for heparin gtt  Indication:  PAD/DVT suspect  Allergies  Allergen Reactions   Aldomet [Methyldopa] Other (See Comments)    Severe hypotension   Brilinta [Ticagrelor] Shortness Of Breath   Other Other (See Comments)    Nuclear Stress Test Medication caused seizures   Coumadin [Warfarin Sodium] Swelling    Arm swelled   Eliquis [Apixaban] Other (See Comments)    BP got too low   Entresto [Sacubitril-Valsartan]     hypotension   Lisinopril Other (See Comments)   Trazodone Other (See Comments)    Could not wake up from medication, comatosed   Penicillins Rash    Has patient had a PCN reaction causing immediate rash, facial/tongue/throat swelling, SOB or lightheadedness with hypotension: Yes Has patient had a PCN reaction causing severe rash involving mucus membranes or skin necrosis: No Has patient had a PCN reaction that required hospitalization: No Has patient had a PCN reaction occurring within the last 10 years: No If all of the above answers are "NO", then may proceed with Cephalosporin use.     Patient Measurements: Height: 5\' 1"  (154.9 cm) Weight: 45.5 kg (100 lb 3.2 oz) IBW/kg (Calculated) : 47.8 Heparin Dosing Weight: HEPARIN DW (KG): 45.5   Vital Signs: Temp: 97.7 F (36.5 C) (08/27 0857) Temp Source: Oral (08/27 0857) BP: 136/78 (08/27 1119) Pulse Rate: 63 (08/27 1119)  Labs: Recent Labs    08/28/21 0923  HGB 16.5*  HCT 50.8*  PLT 199  CREATININE 0.69    Estimated Creatinine Clearance: 56.4 mL/min (by C-G formula based on SCr of 0.69 mg/dL).   Medical History: Past Medical History:  Diagnosis Date   Acute myocardial infarction of other lateral wall, initial episode of care 01/2013   DES CFX   Aortoiliac occlusive disease (HCC) 08/22/2016   CAD (coronary artery disease) 11/13/2011   50% ? proximal LCx stenosis per Cath at [facility]   Chest pain, atypical, muscular skelatal   01/09/2014    COVID 02/08/2020   Dyspnea    Heart failure (HCC)    HLD (hyperlipidemia)    Hypertension    Intermediate coronary syndrome (HCC)    LBBB (left bundle branch block)    Mitral regurgitation    Nonischemic cardiomyopathy (HCC) 01/26/2017   Presence of combination internal cardiac defibrillator (ICD) and pacemaker    PVD (peripheral vascular disease) (HCC)    Right CFA stenosis per report on 11/14/2011   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/25/2016   Edwards McArthy-Adams IMR ETLogix ring annuloplasty (Model 4100, Serial 08/27/2016, size 26) placed via right mini thoracotomy approach   Stenosis of left subclavian artery (HCC) 08/22/2016   Stroke (HCC)    tia with no deficits    Medications:  (Not in a hospital admission)  Scheduled:   heparin  3,000 Units Intravenous Once   Infusions:   heparin     PRN:  Anti-infectives (From admission, onward)    None       Assessment: 08/24/2016 a 56 y.o. female requires anticoagulation with a heparin iv infusion for the indication of  DVT and PAD . Heparin gtt will be started following pharmacy protocol per pharmacy consult. Patient is not on previous oral anticoagulant that will require aPTT/HL correlation before transitioning to only HL monitoring.   Goal of Therapy:  Heparin level 0.3-0.7 units/ml Monitor platelets by anticoagulation protocol: Yes   Plan:  Give 3000 units bolus x 1 Start heparin infusion  at 750 units/hr Check anti-Xa level in 6 hours and daily while on heparin Continue to monitor H&H and platelets  Gerre Pebbles Teauna Dubach 08/28/2021,12:15 PM

## 2021-08-28 NOTE — Hospital Course (Signed)
56 year old female with a history of hypertension, TIA, coronary disease, peripheral vascular disease, hypertension, mitral regurgitation status post annuloplasty, systolic CHF, tobacco abuse, hyperlipidemia presenting with left shoulder and left upper arm pain that was moderate to severe nature.  She stated that she has had increasing frequency of pain in her left upper arm in the past 2 weeks.  However after she woke up this morning on 08/28/2021 she stated the pain was most severe.  She denies any fevers, chills, chest pain, shortness breath, cough, hemoptysis.  Notably, the patient has a known history of left subclavian stenosis which was discovered in January 2023 when she had a carotid ultrasound.  The patient had been following Dr. Fletcher Anon, and she has opted for medical surveillance since her symptoms have been mild.  The patient denies any frank discoloration of her left hand but she has had some numbness and tingling in her fingers.  Unfortunately, the patient continues to smoke 1/2 pack/day. Because of her worsening pain in her left arm, she presented for further evaluation and treatment. In the ED, the patient was afebrile and hemodynamically stable with oxygen saturation 99% on room air.  BMP showed sodium 140, potassium 3.6, bicarbonate 25, BUN 9, creatinine 0.69.  AST 23, ALT 15, alk phosphatase 91, total bilirubin 1.0.  WBC 6.6, hemoglobin 16.5, platelets 199,000.  CTA of the chest was negative for PE or dissection but showed high-grade stenosis of the subclavian artery, 80-99%. EDP spoke with Dr. Fletcher Anon who recommended transfer to [facility] for vascular intervention on 08/29/2021.

## 2021-08-28 NOTE — Assessment & Plan Note (Signed)
Clinically euvolemic 03/31/20 echo EF 50-55% (improved from 25%) Continue carvedilol, furosemide, losartan

## 2021-08-28 NOTE — H&P (Signed)
History and Physical    Patient: Lindsay Camacho [id number]:[id number] DOB: 01/11/1965 DOA: 08/28/2021 DOS: the patient was seen and examined on 08/28/2021 PCP: Pcp, No  Patient coming from: Home  Chief Complaint:  Chief Complaint  Patient presents with   Arm Pain   HPI: Lindsay Camacho is a 56 year old female with a history of hypertension, TIA, coronary disease, peripheral vascular disease, hypertension, mitral regurgitation status post annuloplasty, systolic CHF, tobacco abuse, hyperlipidemia presenting with left shoulder and left upper arm pain that was moderate to severe nature.  She stated that she has had increasing frequency of pain in her left upper arm in the past 2 weeks.  However after she woke up this morning on 08/28/2021 she stated the pain was most severe.  She denies any fevers, chills, chest pain, shortness breath, cough, hemoptysis.  Notably, the patient has a known history of left subclavian stenosis which was discovered in January 2023 when she had a carotid ultrasound.  The patient had been following Dr. Fletcher Anon, and she has opted for medical surveillance since her symptoms have been mild.  The patient denies any frank discoloration of her left hand but she has had some numbness and tingling in her fingers.  Unfortunately, the patient continues to smoke 1/2 pack/day. Because of her worsening pain in her left arm, she presented for further evaluation and treatment. In the ED, the patient was afebrile and hemodynamically stable with oxygen saturation 99% on room air.  BMP showed sodium 140, potassium 3.6, bicarbonate 25, BUN 9, creatinine 0.69.  AST 23, ALT 15, alk phosphatase 91, total bilirubin 1.0.  WBC 6.6, hemoglobin 16.5, platelets 199,000.  CTA of the chest was negative for PE or dissection but showed high-grade stenosis of the subclavian artery, 80-99%. EDP spoke with Dr. Fletcher Anon who recommended transfer to [facility] for vascular intervention on 08/29/2021.  Review of Systems: As  mentioned in the history of present illness. All other systems reviewed and are negative. Past Medical History:  Diagnosis Date   Acute myocardial infarction of other lateral wall, initial episode of care 01/2013   DES CFX   Aortoiliac occlusive disease ([place]) 08/22/2016   CAD (coronary artery disease) 11/13/2011   50% ? proximal LCx stenosis per Cath at [facility]   Chest pain, atypical, muscular skelatal   01/09/2014   COVID 02/08/2020   Dyspnea    Heart failure (HCC)    HLD (hyperlipidemia)    Hypertension    Intermediate coronary syndrome (HCC)    LBBB (left bundle branch block)    Mitral regurgitation    Nonischemic cardiomyopathy ([place]) 01/26/2017   Presence of combination internal cardiac defibrillator (ICD) and pacemaker    PVD (peripheral vascular disease) ([place])    Right CFA stenosis per report on 11/14/2011   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/25/2016   Edwards McArthy-Adams IMR ETLogix ring annuloplasty (Model 4100, Serial [id number], size 26) placed via right mini thoracotomy approach   Stenosis of left subclavian artery ([place]) 08/22/2016   Stroke ([place])    tia with no deficits   Past Surgical History:  Procedure Laterality Date   APPENDECTOMY     BIV ICD INSERTION CRT-D N/A 01/24/2017   Procedure: BIV ICD INSERTION CRT-D;  Surgeon: Evans Lance, MD;  Location: [place] CV [facility];  Service: Cardiovascular;  Laterality: N/A;   CARDIAC CATHETERIZATION  04/14/2013   Non-obstructive disease, patent CFX stent   CARDIAC CATHETERIZATION N/A 09/21/2014   Procedure: Left Heart Cath and Coronary Angiography;  Surgeon: Troy Sine, MD;  Location: [place] CV [facility];  Service: Cardiovascular;  Laterality: N/A;   CORONARY ANGIOPLASTY WITH STENT PLACEMENT  01/17/2013   mild disease except 99% CFX, rx with  2.5 x 28 Alpine drug-eluting stent    GROIN DEBRIDEMENT Right 04/14/2013   Procedure: Emergency Evacuation of Retroperitoneal Hematoma and Repair Right External Iliac Artery  Pseudoaneurysm    ;  Surgeon: Elam Dutch, MD;  Location: [place];  Service: Vascular;  Laterality: Right;   LEFT HEART CATHETERIZATION WITH CORONARY ANGIOGRAM N/A 01/18/2013   Procedure: LEFT HEART CATHETERIZATION WITH CORONARY ANGIOGRAM;  Surgeon: Jettie Booze, MD;  Location: [facility] CATH LAB;  Service: Cardiovascular;  Laterality: N/A;   LEFT HEART CATHETERIZATION WITH CORONARY ANGIOGRAM N/A 04/14/2013   Procedure: LEFT HEART CATHETERIZATION WITH CORONARY ANGIOGRAM;  Surgeon: Jettie Booze, MD;  Location: [facility] CATH LAB;  Service: Cardiovascular;  Laterality: N/A;   MITRAL VALVE REPAIR Right 08/25/2016   Procedure: MINIMALLY INVASIVE MITRAL VALVE REPAIR;  Surgeon: Rexene Alberts, MD;  Location: [place];  Service: Open Heart Surgery;  Laterality: Right;   MULTIPLE EXTRACTIONS WITH ALVEOLOPLASTY N/A 08/23/2016   Procedure: Extraction of tooth #'s 17, 20-23, 26-29 with alveoloplasty;  Surgeon: Lenn Cal, DDS;  Location: [place];  Service: Oral Surgery;  Laterality: N/A;   PERCUTANEOUS CORONARY STENT INTERVENTION (PCI-S)  01/18/2013   Procedure: PERCUTANEOUS CORONARY STENT INTERVENTION (PCI-S);  Surgeon: Jettie Booze, MD;  Location: [facility] CATH LAB;  Service: Cardiovascular;;   RIGHT/LEFT HEART CATH AND CORONARY ANGIOGRAPHY N/A 08/21/2016   Procedure: RIGHT/LEFT HEART CATH AND CORONARY ANGIOGRAPHY;  Surgeon: Burnell Blanks, MD;  Location: [place] CV LAB;  Service: Cardiovascular;  Laterality: N/A;   TEE WITHOUT CARDIOVERSION N/A 03/06/2014   Procedure: TRANSESOPHAGEAL ECHOCARDIOGRAM (TEE);  Surgeon: Dorothy Spark, MD;  Location: [place];  Service: Cardiovascular;  Laterality: N/A;   TEE WITHOUT CARDIOVERSION N/A 08/18/2016   Procedure: TRANSESOPHAGEAL ECHOCARDIOGRAM (TEE);  Surgeon: Lelon Perla, MD;  Location: [facility] ENDOSCOPY;  Service: Cardiovascular;  Laterality: N/A;   TEE WITHOUT CARDIOVERSION N/A 08/25/2016   Procedure: TRANSESOPHAGEAL ECHOCARDIOGRAM (TEE);   Surgeon: Rexene Alberts, MD;  Location: [place];  Service: Open Heart Surgery;  Laterality: N/A;   TUBAL LIGATION     Social History:  reports that she has been smoking cigarettes. She has been smoking an average of .5 packs per day. She has never used smokeless tobacco. She reports that she does not currently use drugs after having used the following drugs: Marijuana. She reports that she does not drink alcohol.  Allergies  Allergen Reactions   Aldomet [Methyldopa] Other (See Comments)    Severe hypotension   Brilinta [Ticagrelor] Shortness Of Breath   Other Other (See Comments)    Nuclear Stress Test Medication caused seizures   Coumadin [Warfarin Sodium] Swelling    Arm swelled   Eliquis [Apixaban] Other (See Comments)    BP got too low   Entresto [Sacubitril-Valsartan]     hypotension   Lisinopril Other (See Comments)   Trazodone Other (See Comments)    Could not wake up from medication, comatosed   Penicillins Rash    Has patient had a PCN reaction causing immediate rash, facial/tongue/throat swelling, SOB or lightheadedness with hypotension: Yes Has patient had a PCN reaction causing severe rash involving mucus membranes or skin necrosis: No Has patient had a PCN reaction that required hospitalization: No Has patient had a PCN reaction occurring within the last 10  years: No If all of the above answers are "NO", then may proceed with Cephalosporin use.     Family History  Problem Relation Age of Onset   CAD Mother 86   Lung cancer Mother    Bladder Cancer Mother    Stroke Mother    Heart disease Mother        Before age 75   Hypertension Mother    Heart attack Mother    CAD Father 36   Heart disease Father        Before age 29   Heart attack Father    Stroke Father        Bleeding stroke    Prior to Admission medications   Medication Sig Start Date End Date Taking? Authorizing Provider  acetaminophen (TYLENOL) 500 MG tablet Take 1,000 mg by mouth every 6 (six)  hours as needed for headache (pain).    [provider]  aspirin EC 81 MG tablet Take 81 mg by mouth daily.    [provider]  atorvastatin (LIPITOR) 80 MG tablet Take 1 tablet (80 mg total) by mouth daily. 09/07/20   Evans Lance, MD  Bempedoic Acid (NEXLETOL) 180 MG TABS Take 180 mg by mouth daily. 02/11/20   Jettie Booze, MD  carvedilol (COREG) 6.25 MG tablet Take 1 tablet (6.25 mg total) by mouth 2 (two) times daily. 09/07/20   Evans Lance, MD  clopidogrel (PLAVIX) 75 MG tablet Take 1 tablet (75 mg total) by mouth daily. 09/10/20   Evans Lance, MD  ergocalciferol (VITAMIN D2) 1.25 MG (50000 UT) capsule Take 50,000 Units by mouth once a week.    [provider]  furosemide (LASIX) 40 MG tablet Take 1 tablet (40 mg total) by mouth daily as needed for fluid or edema (shortness of breath). 03/05/20   Shirley Friar, PA-C  losartan (COZAAR) 25 MG tablet Take 0.5 tablets (12.5 mg total) by mouth daily. 09/10/20 08/19/21  Evans Lance, MD  methocarbamol (ROBAXIN) 500 MG tablet Take 1 tablet (500 mg total) by mouth every 8 (eight) hours as needed for muscle spasms. 02/27/20   Petrucelli, Samantha R, PA-C  pantoprazole (PROTONIX) 40 MG tablet Take 1 tablet (40 mg total) by mouth daily. Future refills need to be from PCP! 03/05/20   Shirley Friar, PA-C  potassium chloride (KLOR-CON) 10 MEQ tablet Take 2 tablets (20 mEq total) by mouth daily. 09/10/20   Evans Lance, MD  SPIRIVA RESPIMAT 1.25 MCG/ACT AERS Take 2 puffs by mouth daily. 11/29/20   [provider]  spironolactone (ALDACTONE) 25 MG tablet Take 25 mg by mouth daily.    [provider]    Physical Exam: Vitals:   08/28/21 0857 08/28/21 0930 08/28/21 1119 08/28/21 1255  BP: (!) 196/96 (!) 159/92 136/78 (!) 171/73  Pulse: 73  63 63  Resp: _0 Temp: 97.7 F (36.5 C)   98 F (36.7 C)  TempSrc: Oral     SpO2: 97% 95% 100% 97%  Weight:      Height:       GENERAL:  A&O x 3, NAD, well developed, cooperative, follows commands HEENT: [place]/AT, No thrush, No icterus, No oral ulcers Neck:  No neck mass, No meningismus, soft, supple CV: RRR, no S3, no S4, no rub, no JVD Lungs:  CTA, no wheeze, no rhonchi, good air movement Abd: soft/NT +BS, nondistended Ext: No edema, no lymphangitis, no cyanosis, no rashes; left radial  and ulnar pulses palpable.  No signs of distal necrosis or infarcts Neuro:  CN II-XII intact, strength 4/5 in RUE, RLE, strength 4/5 LUE, LLE; sensation intact bilateral; no dysmetria; babinski equivocal  Data Reviewed: Data reviewed above in the history   Assessment and Plan: * Stenosis of left subclavian artery (HCC) 08/28/2021--CTA chest--80-99% left subclavian stenosis EDP spoke with Dr. Lacinda Axon to [facility] for vascular intervention 8/28 Started on IV heparin  CAD - CFX DES 01/17/13 No chest pain presently Continue aspirin, carvedilol, statin  ICD (implantable cardioverter-defibrillator) in place Follow-up cardiology  Mitral regurgitation Status post mitral valve annuloplasty  Chronic systolic CHF (congestive heart failure) ([place]) Clinically euvolemic 03/31/20 echo EF 50-55% (improved from 25%) Continue carvedilol, furosemide, losartan  Hyperlipidemia with target LDL less than 70 Continue Lipitor  Tobacco abuse Tobacco cessation discussed  HTN (hypertension) Continue carvedilol and losartan      Advance Care Planning: FULL  Consults: cardiology--Dr. Fletcher Anon  Family Communication: brother updated 8/27  Severity of Illness: The appropriate patient status for this patient is INPATIENT. Inpatient status is judged to be reasonable and necessary in order to provide the required intensity of service to ensure the patient's safety. The patient's presenting symptoms, physical exam findings, and initial radiographic and laboratory data in the context of their chronic comorbidities is felt to place them at high  risk for further clinical deterioration. Furthermore, it is not anticipated that the patient will be medically stable for discharge from the hospital within 2 midnights of admission.   * I certify that at the point of admission it is my clinical judgment that the patient will require inpatient hospital care spanning beyond 2 midnights from the point of admission due to high intensity of service, high risk for further deterioration and high frequency of surveillance required.*  Author: Orson Eva, MD 08/28/2021 1:00 PM  For on call review [email or web address].

## 2021-08-28 NOTE — Assessment & Plan Note (Signed)
No chest pain presently Continue aspirin, carvedilol, statin

## 2021-08-28 NOTE — Assessment & Plan Note (Signed)
Continue carvedilol and losartan 

## 2021-08-28 NOTE — Assessment & Plan Note (Signed)
Follow up cardiology 

## 2021-08-28 NOTE — Assessment & Plan Note (Signed)
-  Continue Lipitor °

## 2021-08-28 NOTE — Assessment & Plan Note (Signed)
Status post mitral valve annuloplasty

## 2021-08-28 NOTE — Assessment & Plan Note (Addendum)
08/28/2021--CTA chest--80-99% left subclavian stenosis EDP spoke with Dr. Ferol Luz to [facility] for vascular intervention 8/28 Started on IV heparin

## 2021-08-29 DIAGNOSIS — I771 Stricture of artery: Secondary | ICD-10-CM | POA: Diagnosis not present

## 2021-08-29 LAB — BASIC METABOLIC PANEL
Anion gap: 9 (ref 5–15)
BUN: 14 mg/dL (ref 6–20)
CO2: 23 mmol/L (ref 22–32)
Calcium: 8.4 mg/dL — ABNORMAL LOW (ref 8.9–10.3)
Chloride: 107 mmol/L (ref 98–111)
Creatinine, Ser: 1.04 mg/dL — ABNORMAL HIGH (ref 0.44–1.00)
GFR, Estimated: >60 mL/min (ref >60)
Glucose, Bld: 89 mg/dL (ref 70–99)
Potassium: 3.8 mmol/L (ref 3.5–5.1)
Sodium: 139 mmol/L (ref 135–145)

## 2021-08-29 LAB — CBC
HCT: 44.6 % (ref 36.0–46.0)
Hemoglobin: 14.5 g/dL (ref 12.0–15.0)
MCH: 29.4 pg (ref 26.0–34.0)
MCHC: 32.5 g/dL (ref 30.0–36.0)
MCV: 90.3 fL (ref 80.0–100.0)
Platelets: 163 10*3/uL (ref 150–400)
RBC: 4.94 MIL/uL (ref 3.87–5.11)
RDW: 13.9 % (ref 11.5–15.5)
WBC: 5.9 10*3/uL (ref 4.0–10.5)
nRBC: 0 % (ref 0.0–0.2)

## 2021-08-29 LAB — HIV ANTIBODY (ROUTINE TESTING W REFLEX): HIV Screen 4th Generation wRfx: NONREACTIVE

## 2021-08-29 LAB — HEPARIN LEVEL (UNFRACTIONATED): Heparin Unfractionated: 0.31 IU/mL (ref 0.30–0.70)

## 2021-08-29 MED ORDER — LOSARTAN POTASSIUM 25 MG PO TABS: 25.0000 mg | ORAL_TABLET | Freq: Every day | ORAL | 0 refills | Status: DC

## 2021-08-29 MED ORDER — LOSARTAN POTASSIUM 25 MG PO TABS: 25.0000 mg | ORAL_TABLET | Freq: Every day | ORAL | Status: DC

## 2021-08-29 NOTE — Care Management Obs Status (Signed)
MEDICARE OBSERVATION STATUS NOTIFICATION   Patient Details  Name: Lindsay Camacho MRN: [id number] Date of Birth: [birth year]   Medicare Observation Status Notification Given:  Yes    Leone Haven, RN 08/29/2021, 11:37 AM

## 2021-08-29 NOTE — Progress Notes (Signed)
ANTICOAGULATION CONSULT NOTE - Follow up Consult  Pharmacy Consult for heparin gtt  Indication:  PAD/DVT suspect  Allergies  Allergen Reactions   Aldomet [Methyldopa] Other (See Comments)    Severe hypotension   Brilinta [Ticagrelor] Shortness Of Breath   Other Other (See Comments)    Nuclear Stress Test Medication caused seizures   Coumadin [Warfarin Sodium] Swelling    Arm swelled   Eliquis [Apixaban] Other (See Comments)    BP got too low   Entresto [Sacubitril-Valsartan]     hypotension   Lisinopril Other (See Comments)   Trazodone Other (See Comments)    Could not wake up from medication, comatosed   Penicillins Rash    Has patient had a PCN reaction causing immediate rash, facial/tongue/throat swelling, SOB or lightheadedness with hypotension: Yes Has patient had a PCN reaction causing severe rash involving mucus membranes or skin necrosis: No Has patient had a PCN reaction that required hospitalization: No Has patient had a PCN reaction occurring within the last 10 years: No If all of the above answers are "NO", then may proceed with Cephalosporin use.     Patient Measurements: Height: 5\' 1"  (154.9 cm) Weight: 44.6 kg (98 lb 6.4 oz) IBW/kg (Calculated) : 47.8 Heparin Dosing Weight: HEPARIN DW (KG): 44.6   Vital Signs: Temp: 97.5 F (36.4 C) (08/28 0055) Temp Source: Oral (08/28 0055) BP: 119/69 (08/28 0055) Pulse Rate: 64 (08/28 0055)  Labs: Recent Labs    08/28/21 0923 08/28/21 1838  HGB 16.5*  --   HCT 50.8*  --   PLT 199  --   HEPARINUNFRC  --  0.87*  CREATININE 0.69  --      Estimated Creatinine Clearance: 55.3 mL/min (by C-G formula based on SCr of 0.69 mg/dL).   Medical History: Past Medical History:  Diagnosis Date   Acute myocardial infarction of other lateral wall, initial episode of care 01/2013   DES CFX   Aortoiliac occlusive disease (HCC) 08/22/2016   CAD (coronary artery disease) 11/13/2011   50% ? proximal LCx stenosis per Cath  at [facility]   Chest pain, atypical, muscular skelatal   01/09/2014   COVID 02/08/2020   Dyspnea    Heart failure (HCC)    HLD (hyperlipidemia)    Hypertension    Intermediate coronary syndrome (HCC)    LBBB (left bundle branch block)    Mitral regurgitation    Nonischemic cardiomyopathy (HCC) 01/26/2017   Presence of combination internal cardiac defibrillator (ICD) and pacemaker    PVD (peripheral vascular disease) (HCC)    Right CFA stenosis per report on 11/14/2011   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/25/2016   Edwards McArthy-Adams IMR ETLogix ring annuloplasty (Model 4100, Serial 08/27/2016, size 26) placed via right mini thoracotomy approach   Stenosis of left subclavian artery (HCC) 08/22/2016   Stroke (HCC)    tia with no deficits    Medications:  Medications Prior to Admission  Medication Sig Dispense Refill Last Dose   aspirin EC 81 MG tablet Take 81 mg by mouth daily.   08/27/2021   Bempedoic Acid (NEXLETOL) 180 MG TABS Take 180 mg by mouth daily. 90 tablet 2 Past Week   carvedilol (COREG) 6.25 MG tablet Take 1 tablet (6.25 mg total) by mouth 2 (two) times daily. 180 tablet 3 08/27/2021   clopidogrel (PLAVIX) 75 MG tablet Take 1 tablet (75 mg total) by mouth daily. 90 tablet 3 08/27/2021 at PM   ergocalciferol (VITAMIN D2) 1.25 MG (50000 UT) capsule  Take 50,000 Units by mouth once a week.   Past Week   furosemide (LASIX) 40 MG tablet Take 1 tablet (40 mg total) by mouth daily as needed for fluid or edema (shortness of breath). 90 tablet 3 Past Week   methocarbamol (ROBAXIN) 500 MG tablet Take 1 tablet (500 mg total) by mouth every 8 (eight) hours as needed for muscle spasms. 15 tablet 0 08/27/2021   pantoprazole (PROTONIX) 40 MG tablet Take 1 tablet (40 mg total) by mouth daily. Future refills need to be from PCP! 30 tablet 1 08/27/2021   potassium chloride (KLOR-CON) 10 MEQ tablet Take 2 tablets (20 mEq total) by mouth daily. 180 tablet 3 08/27/2021   SPIRIVA RESPIMAT 1.25  MCG/ACT AERS Take 2 puffs by mouth daily.   Past Month   spironolactone (ALDACTONE) 25 MG tablet Take 25 mg by mouth daily.   08/27/2021   acetaminophen (TYLENOL) 500 MG tablet Take 1,000 mg by mouth every 6 (six) hours as needed for headache (pain).   More than a month   atorvastatin (LIPITOR) 80 MG tablet Take 1 tablet (80 mg total) by mouth daily. 90 tablet 3    losartan (COZAAR) 25 MG tablet Take 0.5 tablets (12.5 mg total) by mouth daily. 45 tablet 3    Scheduled:   atorvastatin  80 mg Oral Daily   carvedilol  6.25 mg Oral BID   losartan  12.5 mg Oral Daily   pantoprazole  40 mg Oral Daily   spironolactone  25 mg Oral Daily   umeclidinium bromide  1 puff Inhalation Daily   Infusions:   heparin 500 Units/hr (08/28/21 2219)   PRN:  Anti-infectives (From admission, onward)    None       Assessment: [name] a 56 y.o. female requires anticoagulation with a heparin iv infusion for the indication of  DVT and PAD . Heparin gtt will be started following pharmacy protocol per pharmacy consult. Patient is not on previous oral anticoagulant that will require aPTT/HL correlation before transitioning to only HL monitoring.   8/28 AM update:  Heparin level 0.31-not crossing into Epic   Goal of Therapy:  Heparin level 0.3-0.7 units/ml Monitor platelets by anticoagulation protocol: Yes   Plan:  Cont heparin 500 units/hr 1200 heparin level  Abran Duke, PharmD, BCPS Clinical Pharmacist Phone: [phone]

## 2021-08-29 NOTE — TOC Transition Note (Signed)
Transition of Care [facility]) - CM/SW Discharge Note   Patient Details  Name: Lindsay Camacho MRN: [id number] Date of Birth: [birth year]  [facility] CM/SW Contact:  Leone Haven, RN Phone Number: 08/29/2021, 11:26 AM   Clinical Narrative:    Patient is for dc today, NCM spoke with her about her PCP, she states she has a PCP and she is not sure of his name but she will make her follow up apt, she states she just got a new PCP. Sister is in the room and they states they will transport home.          Patient Goals and CMS Choice        Discharge Placement                       Discharge Plan and Services                                     Social Determinants of Health (SDOH) Interventions     Readmission Risk Interventions     No data to display

## 2021-08-29 NOTE — Discharge Summary (Signed)
Physician Discharge Summary  Lindsay Camacho GSU:[id number] DOB: [birth year] DOA: 08/28/2021  PCP: Pcp, No  Admit date: 08/28/2021 Discharge date: 08/29/2021  Admitted From: Home Disposition:  Home  Recommendations for Outpatient Follow-up:  Follow up with PCP in 1-2 weeks Please obtain BMP/CBC in one week your next doctors visit.  Outpatient follow-up to be arranged by cardiology team Losartan increased to 25 mg daily  Discharge Condition: Stable CODE STATUS: Full code Diet recommendation: Heart healthy  Brief/Interim Summary: 56 year old female with a history of hypertension, TIA, coronary disease, peripheral vascular disease, hypertension, mitral regurgitation status post annuloplasty, systolic CHF, tobacco abuse, hyperlipidemia presenting with left shoulder and left upper arm pain that was moderate to severe nature.  She was diagnosed with left subclavian stenosis in January 2023 and opted for conservative management and surveillance at that time.  She had been following with Dr. Kirke Corin.  Presents again with similar pain, CTA negative for PE but showed high-grade left subclavian artery stenosis. Dr Kirke Corin, recommended transferring patient to [facility] for further management.  Patient was seen by cardiology team the following morning who recommended increasing her losartan.  Her pain had subsided therefore she is being discharged home in stable condition after clearance by cardiology with recommendations as mentioned above.     Assessment & Plan:  Principal Problem:   Stenosis of left subclavian artery (HCC) Active Problems:   CAD - CFX DES 01/17/13   HTN (hypertension)   Tobacco abuse   Hyperlipidemia with target LDL less than 70   Chronic systolic CHF (congestive heart failure) (HCC)   Mitral regurgitation   ICD (implantable cardioverter-defibrillator) in place   Subclavian arterial stenosis (HCC)       Assessment and Plan: * Stenosis of left subclavian artery  (HCC) 08/28/2021--CTA chest--80-99% left subclavian stenosis EDP spoke with Dr. Kirke Corin who recommended transfer to [facility] for vascular intervention on 8/28.  Discontinue heparin drip, cleared by cardiology team for discharge.   CAD - CFX DES 01/17/13 ICD in place Chest pain-free Continue aspirin, carvedilol, statin   Mitral regurgitation Status post mitral valve annuloplasty   Chronic systolic CHF (congestive heart failure) (HCC), EF 55% Euvolemic Continue carvedilol, furosemide, losartan.  Advised to increase losartan to 25 mg daily   Hyperlipidemia with target LDL less than 70 Continue Lipitor   Tobacco abuse Tobacco cessation discussed   HTN (hypertension) Continue carvedilol and losartan.  Increase losartan to 25 mg daily     Discharge Diagnoses:  Principal Problem:   Stenosis of left subclavian artery (HCC) Active Problems:   CAD - CFX DES 01/17/13   HTN (hypertension)   Tobacco abuse   Chest pain   Hyperlipidemia with target LDL less than 70   Chronic systolic CHF (congestive heart failure) (HCC)   Mitral regurgitation   ICD (implantable cardioverter-defibrillator) in place   Subclavian arterial stenosis [facility])      Consultations: Cardiology  Subjective: No complaints feeling well, denies any chest pain.  Discharge Exam: Vitals:   08/29/21 0425 08/29/21 0745  BP: (!) 152/76 (!) 143/77  Pulse: (!) 58   Resp: 16   Temp: (!) 97.4 F (36.3 C)   SpO2: 100%    Vitals:   08/29/21 0000 08/29/21 0055 08/29/21 0425 08/29/21 0745  BP:  119/69 (!) 152/76 (!) 143/77  Pulse: 68 64 (!) 58   Resp:  17 16   Temp:  (!) 97.5 F (36.4 C) (!) 97.4 F (36.3 C)   TempSrc:  Oral Oral   SpO2:  98% 97% 100%   Weight:      Height:        General: Pt is alert, awake, not in acute distress Cardiovascular: RRR, S1/S2 +, no rubs, no gallops Respiratory: CTA bilaterally, no wheezing, no rhonchi Abdominal: Soft, NT, ND, bowel sounds + Extremities: no edema, no  cyanosis  Discharge Instructions   Allergies as of 08/29/2021       Reactions   Aldomet [methyldopa] Other (See Comments)   Severe hypotension   Brilinta [ticagrelor] Shortness Of Breath   Other Other (See Comments)   Nuclear Stress Test Medication caused seizures   Coumadin [warfarin Sodium] Swelling   Arm swelling   Desyrel [trazodone] Other (See Comments)   Could not wake up from medication, comatosed   Eliquis [apixaban] Other (See Comments)   BP got too low   Entresto [sacubitril-valsartan] Hypertension   Hypotension    Zestril [lisinopril]    Unknown reaction   Penicillins Rash   Has patient had a PCN reaction causing immediate rash, facial/tongue/throat swelling, SOB or lightheadedness with hypotension: Yes Has patient had a PCN reaction causing severe rash involving mucus membranes or skin necrosis: No Has patient had a PCN reaction that required hospitalization: No Has patient had a PCN reaction occurring within the last 10 years: No If all of the above answers are "NO", then may proceed with Cephalosporin use.        Medication List     TAKE these medications    aspirin EC 81 MG tablet Take 81 mg by mouth daily.   atorvastatin 80 MG tablet Commonly known as: LIPITOR Take 1 tablet (80 mg total) by mouth daily.   carvedilol 6.25 MG tablet Commonly known as: COREG Take 1 tablet (6.25 mg total) by mouth 2 (two) times daily.   clopidogrel 75 MG tablet Commonly known as: PLAVIX Take 1 tablet (75 mg total) by mouth daily.   ergocalciferol 1.25 MG (50000 UT) capsule Commonly known as: VITAMIN D2 Take 50,000 Units by mouth once a week.   furosemide 40 MG tablet Commonly known as: LASIX Take 1 tablet (40 mg total) by mouth daily as needed for fluid or edema (shortness of breath).   losartan 25 MG tablet Commonly known as: COZAAR Take 1 tablet (25 mg total) by mouth daily. Start taking on: August 30, 2021 What changed: how much to take   methocarbamol  500 MG tablet Commonly known as: ROBAXIN Take 1 tablet (500 mg total) by mouth every 8 (eight) hours as needed for muscle spasms.   Nexletol 180 MG Tabs Generic drug: Bempedoic Acid Take 180 mg by mouth daily.   pantoprazole 40 MG tablet Commonly known as: PROTONIX Take 1 tablet (40 mg total) by mouth daily. Future refills need to be from PCP! What changed: additional instructions   potassium chloride 10 MEQ tablet Commonly known as: KLOR-CON Take 2 tablets (20 mEq total) by mouth daily.   Spiriva Respimat 1.25 MCG/ACT Aers Generic drug: Tiotropium Bromide Monohydrate Take 2 puffs by mouth daily.   spironolactone 25 MG tablet Commonly known as: ALDACTONE Take 25 mg by mouth daily.        Follow-up Information     Marinus Maw, MD .   Specialty: Cardiology Contact information: [phone] [address] [place] [address] [phone]                Allergies  Allergen Reactions   Aldomet [Methyldopa] Other (See Comments)    Severe hypotension  Brilinta [Ticagrelor] Shortness Of Breath   Other Other (See Comments)    Nuclear Stress Test Medication caused seizures   Coumadin [Warfarin Sodium] Swelling    Arm swelling   Desyrel [Trazodone] Other (See Comments)    Could not wake up from medication, comatosed   Eliquis [Apixaban] Other (See Comments)    BP got too low   Entresto [Sacubitril-Valsartan] Hypertension    Hypotension    Zestril [Lisinopril]     Unknown reaction   Penicillins Rash    Has patient had a PCN reaction causing immediate rash, facial/tongue/throat swelling, SOB or lightheadedness with hypotension: Yes Has patient had a PCN reaction causing severe rash involving mucus membranes or skin necrosis: No Has patient had a PCN reaction that required hospitalization: No Has patient had a PCN reaction occurring within the last 10 years: No If all of the above answers are "NO", then may proceed with Cephalosporin use.     You  were cared for by a hospitalist during your hospital stay. If you have any questions about your discharge medications or the care you received while you were in the hospital after you are discharged, you can call the unit and asked to speak with the hospitalist on call if the hospitalist that took care of you is not available. Once you are discharged, your primary care physician will handle any further medical issues. Please note that no refills for any discharge medications will be authorized once you are discharged, as it is imperative that you return to your primary care physician (or establish a relationship with a primary care physician if you do not have one) for your aftercare needs so that they can reassess your need for medications and monitor your lab values.   Procedures/Studies: CT ANGIO CHEST AORTA W/CM &/OR WO/CM  Result Date: 08/28/2021 CLINICAL DATA:  Pain left upper extremity, evaluate left subclavian artery EXAM: CT ANGIOGRAPHY CHEST WITH CONTRAST TECHNIQUE: Multidetector CT imaging of the chest was performed using the standard protocol during bolus administration of intravenous contrast. Multiplanar CT image reconstructions and MIPs were obtained to evaluate the vascular anatomy. RADIATION DOSE REDUCTION: This exam was performed according to the departmental dose-optimization program which includes automated exposure control, adjustment of the mA and/or kV according to patient size and/or use of iterative reconstruction technique. CONTRAST:  OMNIPAQUE IOHEXOL 350 MG/ML SOLN COMPARISON:  Previous studies including examination done on 02/27/2020 FINDINGS: Cardiovascular: Scattered coronary artery calcifications are seen. Heart is enlarged in size. There is metallic density in the region of mitral annulus, possibly suggesting previous intervention. There are scattered atherosclerotic plaques and calcifications seen thoracic aorta and upper abdominal aorta. There is severe short-segment  stenosis in the proximal course of left subclavian artery with 80-99% narrowing. Rest of the visualized portions of left subclavian artery is patent without other foci of narrowing. There are no intraluminal filling defects in pulmonary artery branches. Mediastinum/Nodes: No significant lymphadenopathy is seen. Lungs/Pleura: There is no focal pulmonary consolidation. Small linear density seen right lower lung field may suggest scarring or subsegmental atelectasis. There is no pleural effusion or pneumothorax. Upper Abdomen: Severe atherosclerotic changes are noted in the upper abdominal aorta with large atherosclerotic plaques and possible thrombi in the margin of the lumen. There is fatty infiltration in liver. Musculoskeletal: Degenerative changes are noted in lower cervical spine with encroachment of neural foramina. Review of the MIP images confirms the above findings. IMPRESSION: There is short segment high-grade stenosis in the proximal course of left subclavian artery  with 80-99% stenosis. There is no evidence of thoracic aortic dissection. There is no evidence of pulmonary artery embolism. Coronary artery calcifications are seen. Severe atherosclerotic changes are noted in the visualized upper abdominal aorta. Fatty liver. Cervical spondylosis. Electronically Signed   By: Ernie Avena M.D.   On: 08/28/2021 11:26   DG Chest Port 1 View  Result Date: 08/28/2021 CLINICAL DATA:  56 year old female with history of chest pain. EXAM: PORTABLE CHEST 1 VIEW COMPARISON:  Chest x-ray 08/14/2021. FINDINGS: Lung volumes are normal. No consolidative airspace disease. No pleural effusions. No pneumothorax. No pulmonary nodule or mass noted. Pulmonary vasculature and the cardiomediastinal silhouette are within normal limits. Status post mitral annuloplasty. Left-sided pacemaker/AICD device in place with lead tips projecting over the expected location of the right atrium and right ventricle. IMPRESSION: 1. No  radiographic evidence of acute cardiopulmonary disease. Electronically Signed   By: Trudie Reed M.D.   On: 08/28/2021 09:40   CUP PACEART REMOTE DEVICE CHECK  Result Date: 08/16/2021 Scheduled remote reviewed. Normal device function.  Next remote 91 days. LA  DG Chest Portable 1 View  Result Date: 08/14/2021 CLINICAL DATA:  Pain behind her eyes with double vision. EXAM: PORTABLE CHEST 1 VIEW COMPARISON:  November 28, 2020 FINDINGS: There is a multi lead AICD. The heart size and mediastinal contours are within normal limits. An artificial cardiac valve is seen. Both lungs are clear. Radiopaque surgical clips are seen along the lateral aspect of the upper right lung. The visualized skeletal structures are unremarkable. IMPRESSION: No active cardiopulmonary disease. Electronically Signed   By: Aram Candela M.D.   On: 08/14/2021 21:53   CT Head Wo Contrast  Result Date: 08/14/2021 CLINICAL DATA:  Posterior eye pain and double vision; hypertension EXAM: CT HEAD WITHOUT CONTRAST TECHNIQUE: Contiguous axial images were obtained from the base of the skull through the vertex without intravenous contrast. RADIATION DOSE REDUCTION: This exam was performed according to the departmental dose-optimization program which includes automated exposure control, adjustment of the mA and/or kV according to patient size and/or use of iterative reconstruction technique. COMPARISON:  CT head 09/19/2014 FINDINGS: Brain: No intracranial hemorrhage, mass effect, or evidence of acute infarct. No hydrocephalus. No extra-axial fluid collection. Generalized cerebral atrophy. Ill-defined hypoattenuation within the cerebral white matter is nonspecific but consistent with chronic small vessel ischemic disease. Vascular: No hyperdense vessel. Calcification of the intracranial internal carotid arteries. Skull: No fracture or focal lesion. Sinuses/Orbits: No acute finding. Paranasal sinuses and mastoid air cells are well aerated.  Other: None. IMPRESSION: No acute intracranial abnormality. Electronically Signed   By: Minerva Fester M.D.   On: 08/14/2021 21:50     The results of significant diagnostics from this hospitalization (including imaging, microbiology, ancillary and laboratory) are listed below for reference.     Microbiology: No results found for this or any previous visit (from the past 240 hour(s)).   Labs: BNP (last 3 results) Recent Labs    11/28/20 0948 08/14/21 2126  BNP 130.9* 462.0*   Basic Metabolic Panel: Recent Labs  Lab 08/28/21 0923 08/29/21 0235  NA 140 139  K 3.6 3.8  CL 108 107  CO2 25 23  GLUCOSE 79 89  BUN 9 14  CREATININE 0.69 1.04*  CALCIUM 8.8* 8.4*   Liver Function Tests: Recent Labs  Lab 08/28/21 0923  AST 23  ALT 15  ALKPHOS 91  BILITOT 1.0  PROT 7.3  ALBUMIN 3.8   No results for input(s): "LIPASE", "AMYLASE" in the last  168 hours. No results for input(s): "AMMONIA" in the last 168 hours. CBC: Recent Labs  Lab 08/28/21 0923 08/29/21 0235  WBC 6.6 5.9  NEUTROABS 4.3  --   HGB 16.5* 14.5  HCT 50.8* 44.6  MCV 89.0 90.3  PLT 199 163   Cardiac Enzymes: No results for input(s): "CKTOTAL", "CKMB", "CKMBINDEX", "TROPONINI" in the last 168 hours. BNP: Invalid input(s): "POCBNP" CBG: No results for input(s): "GLUCAP" in the last 168 hours. D-Dimer No results for input(s): "DDIMER" in the last 72 hours. Hgb A1c No results for input(s): "HGBA1C" in the last 72 hours. Lipid Profile No results for input(s): "CHOL", "HDL", "LDLCALC", "TRIG", "CHOLHDL", "LDLDIRECT" in the last 72 hours. Thyroid function studies No results for input(s): "TSH", "T4TOTAL", "T3FREE", "THYROIDAB" in the last 72 hours.  Invalid input(s): "FREET3" Anemia work up No results for input(s): "VITAMINB12", "FOLATE", "FERRITIN", "TIBC", "IRON", "RETICCTPCT" in the last 72 hours. Urinalysis    Component Value Date/Time   COLORURINE YELLOW 08/23/2016 1631   APPEARANCEUR CLEAR  08/23/2016 1631   LABSPEC 1.007 08/23/2016 1631   PHURINE 7.0 08/23/2016 1631   GLUCOSEU >=500 (A) 08/23/2016 1631   HGBUR NEGATIVE 08/23/2016 1631   BILIRUBINUR NEGATIVE 08/23/2016 1631   KETONESUR NEGATIVE 08/23/2016 1631   PROTEINUR NEGATIVE 08/23/2016 1631   UROBILINOGEN 1.0 09/24/2014 1140   NITRITE NEGATIVE 08/23/2016 1631   LEUKOCYTESUR NEGATIVE 08/23/2016 1631   Sepsis Labs Recent Labs  Lab 08/28/21 0923 08/29/21 0235  WBC 6.6 5.9   Microbiology No results found for this or any previous visit (from the past 240 hour(s)).   Time coordinating discharge:  I have spent 35 minutes face to face with the patient and on the ward discussing the patients care, assessment, plan and disposition with other care givers. >50% of the time was devoted counseling the patient about the risks and benefits of treatment/Discharge disposition and coordinating care.   SIGNED:   Dimple Nanas, MD  [facility] 08/29/2021, 12:38 PM   If 7PM-7AM, please contact night-coverage

## 2021-08-29 NOTE — Consult Note (Addendum)
Cardiology Consultation   Patient ID: [name] MRN: [id number]; DOB: 08/06/1965  Admit date: 08/28/2021 Date of Consult: 08/29/2021  PCP:  Kathyrn Lass   [place] Providers Cardiologist:  Larae Grooms, MD  Electrophysiologist:  Cristopher Peru, MD  {  Patient Profile:   Lindsay Camacho is a 56 y.o. female with a hx of hypertension, TIA, peripheral vascular disease, hyperlipidemia, CAD status post PCI to circumflex '15, tobacco use and subclavian artery stenosis who is being seen 08/29/2021 for the evaluation of left arm pain/subclavian artery stenosis at the request of Dr. Reesa Chew.  History of Present Illness:   Lindsay Camacho is a 56 year old female with past medical history noted above.  She has been followed by Dr. Irish Lack as an outpatient as well as Dr. Fletcher Anon for her left subclavian artery stenosis.  Has a known history of CAD with posterior lateral MI in 2015 which was treated with PCI/DES to left circumflex.  Cardiac catheterization at that time was complicated by retroperitoneal bleed which required assistance from vascular surgery.  She also has a history of HFrEF and severe mitral regurgitation s/p mitral valve repair 2018.  She is known to have a left carotid bruit and underwent carotid Doppler ultrasound which showed no significant stenosis but was noted to have left subclavian stenosis.  As a result she was referred to Dr. Fletcher Anon.  She was seen in the office on 03/01/2021 and given she was without significant symptoms it was recommended that she continue with medical therapy.  Instructed to check her blood pressure and her right arm.  Most recently followed up in the office on 08/19/2021 with Lindsay Rough, PA after recent ER visit where she complained of chest pain, hypertension and blurred vision.  Work-up in the ED revealed elevated blood pressure but otherwise work-up was negative.  At this recent follow-up visit she reported she was still having chest pain intermittently but  had not used any nitro.  It was recommended that she receive an updated echocardiogram and if this was abnormal then pursue further ischemic work-up.  She was continued on losartan 25 mg daily, Coreg 6.25 mg twice daily, as needed Lasix.   She presented to the ED on 8/27 with complaints of dizziness, lightheadedness and elevated blood pressure.  Also had left shoulder and arm pain/numbness.  Reports she was up that morning getting ready for church when she had onset of symptoms.  Noted her blood pressure was 191/101, despite being compliant with her blood pressure medications.  Denied any weakness.  Ultimately called a friend to bring her to the ED for further evaluation.  Labs in the ED showed sodium 140, potassium 3.6, creatinine 0.6, high-sensitivity troponin 10, WBC 6.6, hemoglobin 16.5.  Chest x-ray negative.  CT angio chest with short segment high-grade stenosis of proximal left subclavian with 80 to 99% stenosis without evidence of thoracic aortic dissection.  EKG showed sinus rhythm, atrial sensed, V paced.  She was admitted to internal medicine for further management, started on IV heparin.  Case was discussed with Dr. Fletcher Anon who recommended transfer to [facility] for further evaluation regarding her known history of subclavian artery stenosis.  Past Medical History:  Diagnosis Date   Acute myocardial infarction of other lateral wall, initial episode of care 01/2013   DES CFX   Aortoiliac occlusive disease ([place]) 08/22/2016   CAD (coronary artery disease) 11/13/2011   50% ? proximal LCx stenosis per Cath at [facility]   Chest pain, atypical, muscular skelatal  01/09/2014   COVID 02/08/2020   Dyspnea    Heart failure (HCC)    HLD (hyperlipidemia)    Hypertension    Intermediate coronary syndrome (HCC)    LBBB (left bundle branch block)    Mitral regurgitation    Nonischemic cardiomyopathy ([place]) 01/26/2017   Presence of combination internal cardiac defibrillator (ICD) and pacemaker    PVD  (peripheral vascular disease) ([place])    Right CFA stenosis per report on 11/14/2011   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/25/2016   Edwards McArthy-Adams IMR ETLogix ring annuloplasty (Model 4100, Serial [id number], size 26) placed via right mini thoracotomy approach   Stenosis of left subclavian artery ([place]) 08/22/2016   Stroke ([place])    tia with no deficits    Past Surgical History:  Procedure Laterality Date   APPENDECTOMY     BIV ICD INSERTION CRT-D N/A 01/24/2017   Procedure: BIV ICD INSERTION CRT-D;  Surgeon: Evans Lance, MD;  Location: [place] CV LAB;  Service: Cardiovascular;  Laterality: N/A;   CARDIAC CATHETERIZATION  04/14/2013   Non-obstructive disease, patent CFX stent   CARDIAC CATHETERIZATION N/A 09/21/2014   Procedure: Left Heart Cath and Coronary Angiography;  Surgeon: Troy Sine, MD;  Location: [place] CV LAB;  Service: Cardiovascular;  Laterality: N/A;   CORONARY ANGIOPLASTY WITH STENT PLACEMENT  01/17/2013   mild disease except 99% CFX, rx with  2.5 x 28 Alpine drug-eluting stent    GROIN DEBRIDEMENT Right 04/14/2013   Procedure: Emergency Evacuation of Retroperitoneal Hematoma and Repair Right External Iliac Artery Pseudoaneurysm    ;  Surgeon: Elam Dutch, MD;  Location: [place];  Service: Vascular;  Laterality: Right;   LEFT HEART CATHETERIZATION WITH CORONARY ANGIOGRAM N/A 01/18/2013   Procedure: LEFT HEART CATHETERIZATION WITH CORONARY ANGIOGRAM;  Surgeon: Jettie Booze, MD;  Location: [facility] CATH LAB;  Service: Cardiovascular;  Laterality: N/A;   LEFT HEART CATHETERIZATION WITH CORONARY ANGIOGRAM N/A 04/14/2013   Procedure: LEFT HEART CATHETERIZATION WITH CORONARY ANGIOGRAM;  Surgeon: Jettie Booze, MD;  Location: [facility] CATH LAB;  Service: Cardiovascular;  Laterality: N/A;   MITRAL VALVE REPAIR Right 08/25/2016   Procedure: MINIMALLY INVASIVE MITRAL VALVE REPAIR;  Surgeon: Rexene Alberts, MD;  Location: [place];  Service: Open Heart Surgery;   Laterality: Right;   MULTIPLE EXTRACTIONS WITH ALVEOLOPLASTY N/A 08/23/2016   Procedure: Extraction of tooth #'s 17, 20-23, 26-29 with alveoloplasty;  Surgeon: Lenn Cal, DDS;  Location: [place];  Service: Oral Surgery;  Laterality: N/A;   PERCUTANEOUS CORONARY STENT INTERVENTION (PCI-S)  01/18/2013   Procedure: PERCUTANEOUS CORONARY STENT INTERVENTION (PCI-S);  Surgeon: Jettie Booze, MD;  Location: [facility] CATH LAB;  Service: Cardiovascular;;   RIGHT/LEFT HEART CATH AND CORONARY ANGIOGRAPHY N/A 08/21/2016   Procedure: RIGHT/LEFT HEART CATH AND CORONARY ANGIOGRAPHY;  Surgeon: Burnell Blanks, MD;  Location: [place] CV LAB;  Service: Cardiovascular;  Laterality: N/A;   TEE WITHOUT CARDIOVERSION N/A 03/06/2014   Procedure: TRANSESOPHAGEAL ECHOCARDIOGRAM (TEE);  Surgeon: Dorothy Spark, MD;  Location: [place];  Service: Cardiovascular;  Laterality: N/A;   TEE WITHOUT CARDIOVERSION N/A 08/18/2016   Procedure: TRANSESOPHAGEAL ECHOCARDIOGRAM (TEE);  Surgeon: Lelon Perla, MD;  Location: [facility] ENDOSCOPY;  Service: Cardiovascular;  Laterality: N/A;   TEE WITHOUT CARDIOVERSION N/A 08/25/2016   Procedure: TRANSESOPHAGEAL ECHOCARDIOGRAM (TEE);  Surgeon: Rexene Alberts, MD;  Location: [place];  Service: Open Heart Surgery;  Laterality: N/A;   TUBAL LIGATION  Home Medications:  Prior to Admission medications   Medication Sig Start Date End Date Taking? Authorizing Provider  aspirin EC 81 MG tablet Take 81 mg by mouth daily.   Yes [provider]  Bempedoic Acid (NEXLETOL) 180 MG TABS Take 180 mg by mouth daily. 02/11/20  Yes Jettie Booze, MD  carvedilol (COREG) 6.25 MG tablet Take 1 tablet (6.25 mg total) by mouth 2 (two) times daily. 09/07/20  Yes Evans Lance, MD  clopidogrel (PLAVIX) 75 MG tablet Take 1 tablet (75 mg total) by mouth daily. 09/10/20  Yes Evans Lance, MD  ergocalciferol (VITAMIN D2) 1.25 MG (50000 UT) capsule Take 50,000 Units by mouth once  a week.   Yes [provider]  furosemide (LASIX) 40 MG tablet Take 1 tablet (40 mg total) by mouth daily as needed for fluid or edema (shortness of breath). 03/05/20  Yes Shirley Friar, PA-C  methocarbamol (ROBAXIN) 500 MG tablet Take 1 tablet (500 mg total) by mouth every 8 (eight) hours as needed for muscle spasms. 02/27/20  Yes Petrucelli, Samantha R, PA-C  pantoprazole (PROTONIX) 40 MG tablet Take 1 tablet (40 mg total) by mouth daily. Future refills need to be from PCP! 03/05/20  Yes Tillery, Satira Mccallum, PA-C  potassium chloride (KLOR-CON) 10 MEQ tablet Take 2 tablets (20 mEq total) by mouth daily. 09/10/20  Yes Evans Lance, MD  SPIRIVA RESPIMAT 1.25 MCG/ACT AERS Take 2 puffs by mouth daily. 11/29/20  Yes [provider]  spironolactone (ALDACTONE) 25 MG tablet Take 25 mg by mouth daily.   Yes [provider]  acetaminophen (TYLENOL) 500 MG tablet Take 1,000 mg by mouth every 6 (six) hours as needed for headache (pain).    [provider]  atorvastatin (LIPITOR) 80 MG tablet Take 1 tablet (80 mg total) by mouth daily. 09/07/20   Evans Lance, MD  losartan (COZAAR) 25 MG tablet Take 0.5 tablets (12.5 mg total) by mouth daily. 09/10/20 08/19/21  Evans Lance, MD    Inpatient Medications: Scheduled Meds:  atorvastatin  80 mg Oral Daily   carvedilol  6.25 mg Oral BID   losartan  12.5 mg Oral Daily   pantoprazole  40 mg Oral Daily   spironolactone  25 mg Oral Daily   umeclidinium bromide  1 puff Inhalation Daily   Continuous Infusions:  heparin 500 Units/hr (08/28/21 2219)   PRN Meds: acetaminophen **OR** acetaminophen, furosemide, HYDROcodone-acetaminophen, ondansetron **OR** ondansetron (ZOFRAN) IV  Allergies:    Allergies  Allergen Reactions   Aldomet [Methyldopa] Other (See Comments)    Severe hypotension   Brilinta [Ticagrelor] Shortness Of Breath   Other Other (See Comments)    Nuclear Stress Test Medication caused seizures    Coumadin [Warfarin Sodium] Swelling    Arm swelled   Eliquis [Apixaban] Other (See Comments)    BP got too low   Entresto [Sacubitril-Valsartan]     hypotension   Lisinopril Other (See Comments)   Trazodone Other (See Comments)    Could not wake up from medication, comatosed   Penicillins Rash    Has patient had a PCN reaction causing immediate rash, facial/tongue/throat swelling, SOB or lightheadedness with hypotension: Yes Has patient had a PCN reaction causing severe rash involving mucus membranes or skin necrosis: No Has patient had a PCN reaction that required hospitalization: No Has patient had a PCN reaction occurring within the last 10 years: No If all of the above answers are "NO", then may proceed with  Cephalosporin use.     Social History:   Social History   Socioeconomic History   Marital status: Single    Spouse name: Not on file   Number of children: Not on file   Years of education: Not on file   Highest education level: Not on file  Occupational History   Not on file  Tobacco Use   Smoking status: Every Day    Packs/day: 0.50    Types: Cigarettes   Smokeless tobacco: Never  Vaping Use   Vaping Use: Never used  Substance and Sexual Activity   Alcohol use: No    Alcohol/week: 0.0 standard drinks of alcohol   Drug use: Not Currently    Types: Marijuana   Sexual activity: Yes    Birth control/protection: None  Other Topics Concern   Not on file  Social History Narrative   Not on file   Social Determinants of Health   Financial Resource Strain: Not on file  Food Insecurity: Not on file  Transportation Needs: Not on file  Physical Activity: Not on file  Stress: Not on file  Social Connections: Not on file  Intimate Partner Violence: Not on file    Family History:    Family History  Problem Relation Age of Onset   CAD Mother 57   Lung cancer Mother    Bladder Cancer Mother    Stroke Mother    Heart disease Mother        Before age 74    Hypertension Mother    Heart attack Mother    CAD Father 69   Heart disease Father        Before age 64   Heart attack Father    Stroke Father        Bleeding stroke     ROS:  Please see the history of present illness.   All other ROS reviewed and negative.     Physical Exam/Data:   Vitals:   08/28/21 2220 08/29/21 0000 08/29/21 0055 08/29/21 0425  BP: 138/72  119/69 (!) 152/76  Pulse: 62 68 64 (!) 58  Resp: 20  17 16   Temp: (!) 97.5 F (36.4 C)  (!) 97.5 F (36.4 C) (!) 97.4 F (36.3 C)  TempSrc: Oral  Oral Oral  SpO2: 98% 98% 97% 100%  Weight: 44.6 kg     Height: 5\' 1"  (1.549 m)       Intake/Output Summary (Last 24 hours) at 08/29/2021 1001 Last data filed at 08/28/2021 2219 Gross per 24 hour  Intake 240 ml  Output --  Net 240 ml      08/28/2021   10:20 PM 08/28/2021    8:52 AM 08/19/2021    3:20 PM  Last 3 Weights  Weight (lbs) 98 lb 6.4 oz 100 lb 3.2 oz 100 lb 3.2 oz  Weight (kg) 44.634 kg 45.45 kg 45.45 kg     Body mass index is 18.59 kg/m.  General:  Well nourished, well developed, in no acute distress HEENT: normal Neck: no JVD Vascular: No carotid bruits; Distal pulses 2+ bilaterally Cardiac:  normal S1, S2; RRR; soft systolic murmur  Lungs:  clear to auscultation bilaterally, no wheezing, rhonchi or rales  Abd: soft, nontender, no hepatomegaly  Ext: no edema Musculoskeletal:  No deformities, BUE and BLE strength normal and equal Skin: warm and dry  Neuro:  CNs 2-12 intact, no focal abnormalities noted Psych:  Normal affect   EKG:  The EKG was personally reviewed and demonstrates:  Sinus rhythm, atrial sensed, V paced   Relevant CV Studies:  Echo: 03/2020  IMPRESSIONS     1. Well-functioning mitral valve annuloplasty ring. compared with the  echo 01/2017, systolic function ias imprved from 25% to 50-55%.   2. Left ventricular ejection fraction, by estimation, is 50 to 55%. The  left ventricle has low normal function. The left ventricle has  no regional  wall motion abnormalities. There is mild concentric left ventricular  hypertrophy. Left ventricular  diastolic parameters are indeterminate. Elevated left atrial pressure. The  average left ventricular global longitudinal strain is -11.2 %. The global  longitudinal strain is abnormal.   3. Right ventricular systolic function is normal. The right ventricular  size is normal. There is normal pulmonary artery systolic pressure.   4. Left atrial size was mildly dilated.   5. The mitral valve has been repaired/replaced. Mild mitral valve  regurgitation. No evidence of mitral stenosis. The mean mitral valve  gradient is 2.0 mmHg. Procedure Date: 08/25/2016.   6. Tricuspid valve regurgitation is moderate.   7. The aortic valve is normal in structure. Aortic valve regurgitation is  not visualized. No aortic stenosis is present.   8. The inferior vena cava is normal in size with greater than 50%  respiratory variability, suggesting right atrial pressure of 3 mmHg.   FINDINGS   Left Ventricle: Left ventricular ejection fraction, by estimation, is 50  to 55%. The left ventricle has low normal function. The left ventricle has  no regional wall motion abnormalities. The average left ventricular global  longitudinal strain is -11.2  %. The global longitudinal strain is abnormal. The left ventricular  internal cavity size was normal in size. There is mild concentric left  ventricular hypertrophy. Left ventricular diastolic parameters are  indeterminate. Elevated left atrial pressure.   Right Ventricle: The right ventricular size is normal. No increase in  right ventricular wall thickness. Right ventricular systolic function is  normal. There is normal pulmonary artery systolic pressure. The tricuspid  regurgitant velocity is 2.46 m/s, and   with an assumed right atrial pressure of 3 mmHg, the estimated right  ventricular systolic pressure is 27.2 mmHg.   Left Atrium: Left atrial size  was mildly dilated.   Right Atrium: Right atrial size was normal in size.   Pericardium: Trivial pericardial effusion is present.   Mitral Valve: The mitral valve has been repaired/replaced. Mild mitral  valve regurgitation. There is a 26 mm prosthetic annuloplasty ring present  in the mitral position. No evidence of mitral valve stenosis. MV peak  gradient, 5.9 mmHg. The mean mitral  valve gradient is 2.0 mmHg with average heart rate of 66 bpm.   Tricuspid Valve: The tricuspid valve is normal in structure. Tricuspid  valve regurgitation is moderate . No evidence of tricuspid stenosis.   Aortic Valve: The aortic valve is normal in structure. Aortic valve  regurgitation is not visualized. Aortic regurgitation PHT measures 423  msec. No aortic stenosis is present.   Pulmonic Valve: The pulmonic valve was normal in structure. Pulmonic valve  regurgitation is not visualized. No evidence of pulmonic stenosis.   Aorta: The aortic root is normal in size and structure.   Venous: The inferior vena cava is normal in size with greater than 50%  respiratory variability, suggesting right atrial pressure of 3 mmHg.   IAS/Shunts: No atrial level shunt detected by color flow Doppler.   Additional Comments: A device lead is visualized.   Laboratory Data:  High Sensitivity  Troponin:   Recent Labs  Lab 08/14/21 2126 08/14/21 2314 08/28/21 0923 08/28/21 1116  TROPONINIHS 12 11 10 10      Chemistry Recent Labs  Lab 08/28/21 0923 08/29/21 0235  NA 140 139  K 3.6 3.8  CL 108 107  CO2 25 23  GLUCOSE 79 89  BUN 9 14  CREATININE 0.69 1.04*  CALCIUM 8.8* 8.4*  GFRNONAA >60 >60  ANIONGAP 7 9    Recent Labs  Lab 08/28/21 0923  PROT 7.3  ALBUMIN 3.8  AST 23  ALT 15  ALKPHOS 91  BILITOT 1.0   Lipids No results for input(s): "CHOL", "TRIG", "HDL", "LABVLDL", "LDLCALC", "CHOLHDL" in the last 168 hours.  Hematology Recent Labs  Lab 08/28/21 0923 08/29/21 0235  WBC 6.6 5.9  RBC  5.71* 4.94  HGB 16.5* 14.5  HCT 50.8* 44.6  MCV 89.0 90.3  MCH 28.9 29.4  MCHC 32.5 32.5  RDW 14.1 13.9  PLT 199 163   Thyroid No results for input(s): "TSH", "FREET4" in the last 168 hours.  BNPNo results for input(s): "BNP", "PROBNP" in the last 168 hours.  DDimer No results for input(s): "DDIMER" in the last 168 hours.   Radiology/Studies:  CT ANGIO CHEST AORTA W/CM &/OR WO/CM  Result Date: 08/28/2021 CLINICAL DATA:  Pain left upper extremity, evaluate left subclavian artery EXAM: CT ANGIOGRAPHY CHEST WITH CONTRAST TECHNIQUE: Multidetector CT imaging of the chest was performed using the standard protocol during bolus administration of intravenous contrast. Multiplanar CT image reconstructions and MIPs were obtained to evaluate the vascular anatomy. RADIATION DOSE REDUCTION: This exam was performed according to the departmental dose-optimization program which includes automated exposure control, adjustment of the mA and/or kV according to patient size and/or use of iterative reconstruction technique. CONTRAST:  08/30/2021 OMNIPAQUE IOHEXOL 350 MG/ML SOLN COMPARISON:  Previous studies including examination done on 02/27/2020 FINDINGS: Cardiovascular: Scattered coronary artery calcifications are seen. Heart is enlarged in size. There is metallic density in the region of mitral annulus, possibly suggesting previous intervention. There are scattered atherosclerotic plaques and calcifications seen thoracic aorta and upper abdominal aorta. There is severe short-segment stenosis in the proximal course of left subclavian artery with 80-99% narrowing. Rest of the visualized portions of left subclavian artery is patent without other foci of narrowing. There are no intraluminal filling defects in pulmonary artery branches. Mediastinum/Nodes: No significant lymphadenopathy is seen. Lungs/Pleura: There is no focal pulmonary consolidation. Small linear density seen right lower lung field may suggest scarring or  subsegmental atelectasis. There is no pleural effusion or pneumothorax. Upper Abdomen: Severe atherosclerotic changes are noted in the upper abdominal aorta with large atherosclerotic plaques and possible thrombi in the margin of the lumen. There is fatty infiltration in liver. Musculoskeletal: Degenerative changes are noted in lower cervical spine with encroachment of neural foramina. Review of the MIP images confirms the above findings. IMPRESSION: There is short segment high-grade stenosis in the proximal course of left subclavian artery with 80-99% stenosis. There is no evidence of thoracic aortic dissection. There is no evidence of pulmonary artery embolism. Coronary artery calcifications are seen. Severe atherosclerotic changes are noted in the visualized upper abdominal aorta. Fatty liver. Cervical spondylosis. Electronically Signed   By: 02/29/2020 M.D.   On: 08/28/2021 11:26   DG Chest Port 1 View  Result Date: 08/28/2021 CLINICAL DATA:  56 year old female with history of chest pain. EXAM: PORTABLE CHEST 1 VIEW COMPARISON:  Chest x-ray 08/14/2021. FINDINGS: Lung volumes are normal. No consolidative airspace disease. No  pleural effusions. No pneumothorax. No pulmonary nodule or mass noted. Pulmonary vasculature and the cardiomediastinal silhouette are within normal limits. Status post mitral annuloplasty. Left-sided pacemaker/AICD device in place with lead tips projecting over the expected location of the right atrium and right ventricle. IMPRESSION: 1. No radiographic evidence of acute cardiopulmonary disease. Electronically Signed   By: Vinnie Langton M.D.   On: 08/28/2021 09:40     Assessment and Plan:   MIKAYLIN ARZATE is a 56 y.o. female with a hx of hypertension, TIA, peripheral vascular disease, hyperlipidemia, CAD status post PCI to circumflex '15, tobacco use and subclavian artery stenosis who is being seen 08/29/2021 for the evaluation of left arm pain/subclavian artery stenosis at  the request of Dr. Reesa Chew.  Left subclavian artery stenosis Left arm pain -- Has known 80 to 99% left subclavian artery stenosis which has been monitored.  Previously she has been asymptomatic, though now appears to complain more so of arm pain and numbness, particularly in the setting of elevated blood pressures.  She was transferred from [facility] to [facility] for further evaluation and consideration of intervention. In talking with her, it doesn't seem that she has become more symptomatic. No concern for limb ischemia. Will review with MD regarding management with inpatient intervention vs outpatient follow up with Dr. Fletcher Anon.  Hypertension -- Blood pressure significantly elevated on arrival to the ED, have since improved -- On losartan 12.5 mg daily, carvedilol 6.25 mg twice daily, Aldactone 25 mg daily  CAD S/p circumflex PCI/DES '15 -- Office notes indicate she has had some intermittent episodes of chest pain, though these episodes seem to be isolated to when her blood pressure is significantly elevated.  High-sensitivity troponin negative x2 on admission.  -- continue ASA, plavix, statin, coreg, losartan, aldactone  HFimpEF S/p ICD -- no volume overload on exam -- followed by Dr. Lovena Le -- on coreg, losartan, aldactone   MR status post MVR -- Systolic murmur noted, echocardiogram pending  Hyperlipidemia -- On high-dose statin, nexletol  Tobacco use -- Cessation advised   For questions or updates, please contact [place] Please consult [email or web address] for contact info under    Signed, Reino Bellis, NP  08/29/2021 10:01 AM  ATTENDING ATTESTATION:  After conducting a review of all available clinical information with the care team, interviewing the patient, and performing a physical exam, I agree with the findings and plan described in this note.   GEN: No acute distress.   HEENT:  MMM, no JVD, no scleral icterus Cardiac: RRR, no murmurs, rubs, or  gallops.  Respiratory: Clear to auscultation bilaterally. GI: Soft, nontender, non-distended  MS: No edema; No deformity. Neuro:  Nonfocal  Vasc:  +2 radial pulses; left radial pulse does not seem diminished  The patient is a medically complex 56 year old female with a history of remote lateral myocardial infarction status post PCI of left circumflex, mitral valve repair in 2018, ischemic cardiomyopathy status post BiV ICD, hypertension, hyperlipidemia, ongoing tobacco abuse, and high-grade left subclavian stenosis who was admitted due to hypertensive urgency.  The patient's blood pressures are much better controlled now.  She has no evidence of acute limb ischemia.  She tells me that she does have left upper extremity symptoms when she uses her arm for activities of daily living.  She has had no presyncope or syncope however.  Given the fact her troponins are negative and she denies any chest pain I do not think this represents an anginal equivalent.  I  think the patient can be discharged home with follow-up to determine whether the left subclavian stenosis should be revascularized at this time.  The patient was seen by Dr. Fletcher Anon in June and intervention was deferred at that time.  At this point in time continue medical therapy for hypertension, coronary artery disease, and nonischemic cardiomyopathy.  Continue aspirin, Plavix, statin, losartan, and Aldactone.  Her ejection fraction has normalized.  Could consider Jardiance as an outpatient.  We will increase losartan to 25 mg for improved blood pressure control.  Lenna Sciara, MD Pager [phone]

## 2021-08-29 NOTE — Care Management CC44 (Signed)
Condition Code 44 Documentation Completed  Patient Details  Name: KRISTIN BARCUS MRN: [id number] Date of Birth: 1965-10-07   Condition Code 44 given:  Yes Patient signature on Condition Code 44 notice:  Yes Documentation of 2 MD's agreement:  Yes Code 44 added to claim:  Yes    Leone Haven, RN 08/29/2021, 11:37 AM

## 2021-08-29 NOTE — Progress Notes (Signed)
Pt has orders to be discharged. Discharge instructions given and pt has no additional questions at this time. Medication regimen reviewed and pt educated. Pt verbalized understanding and has no additional questions. Telemetry box removed. IV removed and site in good condition. Pt stable and waiting for transportation. 

## 2021-09-01 ENCOUNTER — Ambulatory Visit (HOSPITAL_COMMUNITY): Payer: Medicare HMO | Attending: Physician Assistant

## 2021-09-01 DIAGNOSIS — I5032 Chronic diastolic (congestive) heart failure: Secondary | ICD-10-CM | POA: Diagnosis present

## 2021-09-01 LAB — ECHOCARDIOGRAM COMPLETE
Area-P 1/2: 2.01 cm2
MV VTI: 1.5 cm2
P 1/2 time: 493 msec
S' Lateral: 2.6 cm

## 2021-09-12 NOTE — H&P (View-Only) (Signed)
Cardiology Office Note   Date:  09/13/2021   ID:  Lindsay Camacho, DOB 11-01-1965, MRN [id number]  PCP:  Pcp, No  Cardiologist:  Dr. Eldridge Dace  No chief complaint on file.     History of Present Illness: Lindsay Camacho is a 56 y.o. female who is here today for follow-up visit regarding left subclavian artery stenosis.   She has known history of coronary artery disease status post lateral MI in 2015 treated with PCI to the left circumflex.  Cardiac catheterization was complicated by retroperitoneal bleed requiring vascular surgery.  She has known history of chronic systolic heart failure and severe mitral regurgitation status post mitral valve repair in 2018.  There is prolonged history of tobacco use.  She is status post an ICD placement. The patient was seen by me in February for left subclavian artery stenosis noted on carotid Doppler after she was found to have left carotid bruit.  carotid Doppler  showed no significant carotid disease.  She was noted to have left subclavian stenosis or occlusion with 40 mm pressure difference between the right and left arm and retrograde flow in the left vertebral artery.   Even though she is left-handed, her left arm claudication was overall mild and not lifestyle limiting.  In addition, she did not have clinical symptoms of subclavian steal syndrome.  Based on that, I recommended medical therapy. However, she was hospitalized recently with dizziness and elevated blood pressure.  She reported left shoulder and arm discomfort with numbness.  CT angiogram confirmed severe proximal left subclavian artery stenosis. She reports worsening left arm claudication with associated dizziness and balance issues.  She has occasional chest pain.  She continues to smoke.  Past Medical History:  Diagnosis Date   Acute myocardial infarction of other lateral wall, initial episode of care 01/2013   DES CFX   Aortoiliac occlusive disease (HCC) 08/22/2016   CAD (coronary  artery disease) 11/13/2011   50% ? proximal LCx stenosis per Cath at [facility]   Chest pain, atypical, muscular skelatal   01/09/2014   COVID 02/08/2020   Dyspnea    Heart failure (HCC)    HLD (hyperlipidemia)    Hypertension    Intermediate coronary syndrome (HCC)    LBBB (left bundle branch block)    Mitral regurgitation    Nonischemic cardiomyopathy (HCC) 01/26/2017   Presence of combination internal cardiac defibrillator (ICD) and pacemaker    PVD (peripheral vascular disease) (HCC)    Right CFA stenosis per report on 11/14/2011   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/25/2016   Edwards McArthy-Adams IMR ETLogix ring annuloplasty (Model 4100, Serial [id number], size 26) placed via right mini thoracotomy approach   Stenosis of left subclavian artery (HCC) 08/22/2016   Stroke (HCC)    tia with no deficits    Past Surgical History:  Procedure Laterality Date   APPENDECTOMY     BIV ICD INSERTION CRT-D N/A 01/24/2017   Procedure: BIV ICD INSERTION CRT-D;  Surgeon: Marinus Maw, MD;  Location: [facility] INVASIVE CV LAB;  Service: Cardiovascular;  Laterality: N/A;   CARDIAC CATHETERIZATION  04/14/2013   Non-obstructive disease, patent CFX stent   CARDIAC CATHETERIZATION N/A 09/21/2014   Procedure: Left Heart Cath and Coronary Angiography;  Surgeon: Lennette Bihari, MD;  Location: [facility] INVASIVE CV LAB;  Service: Cardiovascular;  Laterality: N/A;   CORONARY ANGIOPLASTY WITH STENT PLACEMENT  01/17/2013   mild disease except 99% CFX, rx with  2.5 x 28 Alpine drug-eluting  stent    GROIN DEBRIDEMENT Right 04/14/2013   Procedure: Emergency Evacuation of Retroperitoneal Hematoma and Repair Right External Iliac Artery Pseudoaneurysm    ;  Surgeon: Sherren Kerns, MD;  Location: [facility] OR;  Service: Vascular;  Laterality: Right;   LEFT HEART CATHETERIZATION WITH CORONARY ANGIOGRAM N/A 01/18/2013   Procedure: LEFT HEART CATHETERIZATION WITH CORONARY ANGIOGRAM;  Surgeon: Corky Crafts, MD;  Location: [facility]  CATH LAB;  Service: Cardiovascular;  Laterality: N/A;   LEFT HEART CATHETERIZATION WITH CORONARY ANGIOGRAM N/A 04/14/2013   Procedure: LEFT HEART CATHETERIZATION WITH CORONARY ANGIOGRAM;  Surgeon: Corky Crafts, MD;  Location: [facility] CATH LAB;  Service: Cardiovascular;  Laterality: N/A;   MITRAL VALVE REPAIR Right 08/25/2016   Procedure: MINIMALLY INVASIVE MITRAL VALVE REPAIR;  Surgeon: Purcell Nails, MD;  Location: [facility] OR;  Service: Open Heart Surgery;  Laterality: Right;   MULTIPLE EXTRACTIONS WITH ALVEOLOPLASTY N/A 08/23/2016   Procedure: Extraction of tooth #'s 17, 20-23, 26-29 with alveoloplasty;  Surgeon: [name], DDS;  Location: [facility] OR;  Service: Oral Surgery;  Laterality: N/A;   PERCUTANEOUS CORONARY STENT INTERVENTION (PCI-S)  01/18/2013   Procedure: PERCUTANEOUS CORONARY STENT INTERVENTION (PCI-S);  Surgeon: Corky Crafts, MD;  Location: [facility] CATH LAB;  Service: Cardiovascular;;   RIGHT/LEFT HEART CATH AND CORONARY ANGIOGRAPHY N/A 08/21/2016   Procedure: RIGHT/LEFT HEART CATH AND CORONARY ANGIOGRAPHY;  Surgeon: Kathleene Hazel, MD;  Location: [facility] INVASIVE CV LAB;  Service: Cardiovascular;  Laterality: N/A;   TEE WITHOUT CARDIOVERSION N/A 03/06/2014   Procedure: TRANSESOPHAGEAL ECHOCARDIOGRAM (TEE);  Surgeon: Lars Masson, MD;  Location: [facility] ENDOSCOPY;  Service: Cardiovascular;  Laterality: N/A;   TEE WITHOUT CARDIOVERSION N/A 08/18/2016   Procedure: TRANSESOPHAGEAL ECHOCARDIOGRAM (TEE);  Surgeon: Lewayne Bunting, MD;  Location: [facility] ENDOSCOPY;  Service: Cardiovascular;  Laterality: N/A;   TEE WITHOUT CARDIOVERSION N/A 08/25/2016   Procedure: TRANSESOPHAGEAL ECHOCARDIOGRAM (TEE);  Surgeon: Purcell Nails, MD;  Location: [facility] OR;  Service: Open Heart Surgery;  Laterality: N/A;   TUBAL LIGATION       Current Outpatient Medications  Medication Sig Dispense Refill   aspirin EC 81 MG tablet Take 81 mg by mouth daily.     atorvastatin (LIPITOR) 80 MG tablet Take 1  tablet (80 mg total) by mouth daily. 90 tablet 3   Bempedoic Acid (NEXLETOL) 180 MG TABS Take 180 mg by mouth daily. 90 tablet 2   carvedilol (COREG) 6.25 MG tablet Take 1 tablet (6.25 mg total) by mouth 2 (two) times daily. 180 tablet 3   clopidogrel (PLAVIX) 75 MG tablet Take 1 tablet (75 mg total) by mouth daily. 90 tablet 3   ergocalciferol (VITAMIN D2) 1.25 MG (50000 UT) capsule Take 50,000 Units by mouth once a week.     furosemide (LASIX) 40 MG tablet Take 1 tablet (40 mg total) by mouth daily as needed for fluid or edema (shortness of breath). 90 tablet 3   losartan (COZAAR) 25 MG tablet Take 1 tablet (25 mg total) by mouth daily. 30 tablet 0   methocarbamol (ROBAXIN) 500 MG tablet Take 1 tablet (500 mg total) by mouth every 8 (eight) hours as needed for muscle spasms. 15 tablet 0   pantoprazole (PROTONIX) 40 MG tablet Take 1 tablet (40 mg total) by mouth daily. Future refills need to be from PCP! (Patient taking differently: Take 40 mg by mouth daily.) 30 tablet 1   potassium chloride (KLOR-CON) 10 MEQ tablet Take 2 tablets (20 mEq total) by mouth daily.  180 tablet 3   SPIRIVA RESPIMAT 1.25 MCG/ACT AERS Take 2 puffs by mouth daily.     spironolactone (ALDACTONE) 25 MG tablet Take 25 mg by mouth daily.     No current facility-administered medications for this visit.    Allergies:   Aldomet [methyldopa], Brilinta [ticagrelor], Other, Coumadin [warfarin sodium], Desyrel [trazodone], Eliquis [apixaban], Entresto [sacubitril-valsartan], Zestril [lisinopril], and Penicillins    Social History:  The patient  reports that she has been smoking cigarettes. She has been smoking an average of .5 packs per day. She has never used smokeless tobacco. She reports that she does not currently use drugs after having used the following drugs: Marijuana. She reports that she does not drink alcohol.   Family History:  The patient's family history includes Bladder Cancer in her mother; CAD (age of onset: 22)  in her mother; CAD (age of onset: 62) in her father; Heart attack in her father and mother; Heart disease in her father and mother; Hypertension in her mother; Lung cancer in her mother; Stroke in her father and mother.    ROS:  Please see the history of present illness.   Otherwise, review of systems are positive for none.   All other systems are reviewed and negative.    PHYSICAL EXAM: VS:  BP 115/72   Pulse 73   Ht 5\' 1"  (1.549 m)   Wt 101 lb 12.8 oz (46.2 kg)   SpO2 98%   BMI 19.23 kg/m  , BMI Body mass index is 19.23 kg/m. GEN: Well nourished, well developed, in no acute distress  HEENT: normal  Neck: no JVD,  or masses.  Right carotid bruit.  There is a bruit at the left subclavian artery area. Cardiac: RRR; no rubs, or gallops,no edema .  1 out of 6 systolic murmur at the base. Respiratory:  clear to auscultation bilaterally, normal work of breathing GI: soft, nontender, nondistended, + BS MS: no deformity or atrophy  Skin: warm and dry, no rash Neuro:  Strength and sensation are intact Psych: euthymic mood, full affect Vascular: Radial pulses +2 on the right and mildly diminished on the left.   Femoral pulses normal bilaterally.  However, she has a surgical scar in the right groin from previous surgical repair for bleeding from the femoral artery.     EKG:  EKG is not ordered today.    Recent Labs: 08/14/2021: B Natriuretic Peptide 462.0 08/28/2021: ALT 15 08/29/2021: BUN 14; Creatinine, Ser 1.04; Hemoglobin 14.5; Platelets 163; Potassium 3.8; Sodium 139    Lipid Panel    Component Value Date/Time   CHOL 132 03/07/2019 0923   TRIG 62 03/07/2019 0923   HDL 40 03/07/2019 0923   CHOLHDL 3.3 03/07/2019 0923   CHOLHDL 3.6 07/23/2018 1017   VLDL 21 07/23/2018 1017   LDLCALC 79 03/07/2019 0923      Wt Readings from Last 3 Encounters:  09/13/21 101 lb 12.8 oz (46.2 kg)  08/28/21 98 lb 6.4 oz (44.6 kg)  08/19/21 100 lb 3.2 oz (45.5 kg)           No data to  display            ASSESSMENT AND PLAN:  1.  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  Due to progression, I recommend proceeding with aortic arch angiography and possible endovascular intervention on the left subclavian artery.  I discussed the procedure in details  as well as risks and benefits.  Planned access is via the left common femoral artery given that she has a surgical scar on the right side from prior surgical repair for bleeding after cardiac cath.  She is already on dual antiplatelet therapy.  I discussed the procedure in details as well as risks and benefits.    2.  Coronary artery disease involving native coronary arteries with other forms of angina angina: She seems to be stable overall.  3.  Chronic systolic heart failure: She appears to be euvolemic.  Some of her heart failure medications can likely be uptitrated as long as we rely on blood pressure readings in the right arm.  4.  Status post mitral valve repair: Stable.  5.  Tobacco use: I discussed with her the importance of smoking cessation and provided her with instructions.  6.  Hyperlipidemia: She is on high-dose atorvastatin.  Recommended target LDL of less than 70.    Disposition:   Proceed with aortic arch angiogram and follow-up with me in 1 month.  Signed,  Lorine Bears, MD  09/13/2021 8:48 AM    [facility]

## 2021-09-12 NOTE — Progress Notes (Unsigned)
Cardiology Office Note   Date:  09/13/2021   ID:  Lindsay Camacho, DOB 11-01-1965, MRN [id number]  PCP:  Pcp, No  Cardiologist:  Dr. Eldridge Dace  No chief complaint on file.     History of Present Illness: Lindsay Camacho is a 56 y.o. female who is here today for follow-up visit regarding left subclavian artery stenosis.   She has known history of coronary artery disease status post lateral MI in 2015 treated with PCI to the left circumflex.  Cardiac catheterization was complicated by retroperitoneal bleed requiring vascular surgery.  She has known history of chronic systolic heart failure and severe mitral regurgitation status post mitral valve repair in 2018.  There is prolonged history of tobacco use.  She is status post an ICD placement. The patient was seen by me in February for left subclavian artery stenosis noted on carotid Doppler after she was found to have left carotid bruit.  carotid Doppler  showed no significant carotid disease.  She was noted to have left subclavian stenosis or occlusion with 40 mm pressure difference between the right and left arm and retrograde flow in the left vertebral artery.   Even though she is left-handed, her left arm claudication was overall mild and not lifestyle limiting.  In addition, she did not have clinical symptoms of subclavian steal syndrome.  Based on that, I recommended medical therapy. However, she was hospitalized recently with dizziness and elevated blood pressure.  She reported left shoulder and arm discomfort with numbness.  CT angiogram confirmed severe proximal left subclavian artery stenosis. She reports worsening left arm claudication with associated dizziness and balance issues.  She has occasional chest pain.  She continues to smoke.  Past Medical History:  Diagnosis Date   Acute myocardial infarction of other lateral wall, initial episode of care 01/2013   DES CFX   Aortoiliac occlusive disease (HCC) 08/22/2016   CAD (coronary  artery disease) 11/13/2011   50% ? proximal LCx stenosis per Cath at [facility]   Chest pain, atypical, muscular skelatal   01/09/2014   COVID 02/08/2020   Dyspnea    Heart failure (HCC)    HLD (hyperlipidemia)    Hypertension    Intermediate coronary syndrome (HCC)    LBBB (left bundle branch block)    Mitral regurgitation    Nonischemic cardiomyopathy (HCC) 01/26/2017   Presence of combination internal cardiac defibrillator (ICD) and pacemaker    PVD (peripheral vascular disease) (HCC)    Right CFA stenosis per report on 11/14/2011   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/25/2016   Edwards McArthy-Adams IMR ETLogix ring annuloplasty (Model 4100, Serial [id number], size 26) placed via right mini thoracotomy approach   Stenosis of left subclavian artery (HCC) 08/22/2016   Stroke (HCC)    tia with no deficits    Past Surgical History:  Procedure Laterality Date   APPENDECTOMY     BIV ICD INSERTION CRT-D N/A 01/24/2017   Procedure: BIV ICD INSERTION CRT-D;  Surgeon: Marinus Maw, MD;  Location: [facility] INVASIVE CV LAB;  Service: Cardiovascular;  Laterality: N/A;   CARDIAC CATHETERIZATION  04/14/2013   Non-obstructive disease, patent CFX stent   CARDIAC CATHETERIZATION N/A 09/21/2014   Procedure: Left Heart Cath and Coronary Angiography;  Surgeon: Lennette Bihari, MD;  Location: [facility] INVASIVE CV LAB;  Service: Cardiovascular;  Laterality: N/A;   CORONARY ANGIOPLASTY WITH STENT PLACEMENT  01/17/2013   mild disease except 99% CFX, rx with  2.5 x 28 Alpine drug-eluting  stent    GROIN DEBRIDEMENT Right 04/14/2013   Procedure: Emergency Evacuation of Retroperitoneal Hematoma and Repair Right External Iliac Artery Pseudoaneurysm    ;  Surgeon: Sherren Kerns, MD;  Location: [facility] OR;  Service: Vascular;  Laterality: Right;   LEFT HEART CATHETERIZATION WITH CORONARY ANGIOGRAM N/A 01/18/2013   Procedure: LEFT HEART CATHETERIZATION WITH CORONARY ANGIOGRAM;  Surgeon: Corky Crafts, MD;  Location: [facility]  CATH LAB;  Service: Cardiovascular;  Laterality: N/A;   LEFT HEART CATHETERIZATION WITH CORONARY ANGIOGRAM N/A 04/14/2013   Procedure: LEFT HEART CATHETERIZATION WITH CORONARY ANGIOGRAM;  Surgeon: Corky Crafts, MD;  Location: [facility] CATH LAB;  Service: Cardiovascular;  Laterality: N/A;   MITRAL VALVE REPAIR Right 08/25/2016   Procedure: MINIMALLY INVASIVE MITRAL VALVE REPAIR;  Surgeon: Purcell Nails, MD;  Location[facility] OR;  Service: Open Heart Surgery;  Laterality: Right;   MULTIPLE EXTRACTIONS WITH ALVEOLOPLASTY N/A 08/23/2016   Procedure: Extraction of tooth #'s 17, 20-23, 26-29 with alveoloplasty;  Surgeon: [name], DDS;  Location: [facility] OR;  Service: Oral Surgery;  Laterality: N/A;   PERCUTANEOUS CORONARY STENT INTERVENTION (PCI-S)  01/18/2013   Procedure: PERCUTANEOUS CORONARY STENT INTERVENTION (PCI-S);  Surgeon: Corky Crafts, MD;  Location: [facility] CATH LAB;  Service: Cardiovascular;;   RIGHT/LEFT HEART CATH AND CORONARY ANGIOGRAPHY N/A 08/21/2016   Procedure: RIGHT/LEFT HEART CATH AND CORONARY ANGIOGRAPHY;  Surgeon: Kathleene Hazel, MD;  Location: [facility] INVASIVE CV LAB;  Service: Cardiovascular;  Laterality: N/A;   TEE WITHOUT CARDIOVERSION N/A 03/06/2014   Procedure: TRANSESOPHAGEAL ECHOCARDIOGRAM (TEE);  Surgeon: Lars Masson, MD;  Location: [facility] ENDOSCOPY;  Service: Cardiovascular;  Laterality: N/A;   TEE WITHOUT CARDIOVERSION N/A 08/18/2016   Procedure: TRANSESOPHAGEAL ECHOCARDIOGRAM (TEE);  Surgeon: Lewayne Bunting, MD;  Location: [facility] ENDOSCOPY;  Service: Cardiovascular;  Laterality: N/A;   TEE WITHOUT CARDIOVERSION N/A 08/25/2016   Procedure: TRANSESOPHAGEAL ECHOCARDIOGRAM (TEE);  Surgeon: Purcell Nails, MD;  Location: [facility] OR;  Service: Open Heart Surgery;  Laterality: N/A;   TUBAL LIGATION       Current Outpatient Medications  Medication Sig Dispense Refill   aspirin EC 81 MG tablet Take 81 mg by mouth daily.     atorvastatin (LIPITOR) 80 MG tablet Take 1  tablet (80 mg total) by mouth daily. 90 tablet 3   Bempedoic Acid (NEXLETOL) 180 MG TABS Take 180 mg by mouth daily. 90 tablet 2   carvedilol (COREG) 6.25 MG tablet Take 1 tablet (6.25 mg total) by mouth 2 (two) times daily. 180 tablet 3   clopidogrel (PLAVIX) 75 MG tablet Take 1 tablet (75 mg total) by mouth daily. 90 tablet 3   ergocalciferol (VITAMIN D2) 1.25 MG (50000 UT) capsule Take 50,000 Units by mouth once a week.     furosemide (LASIX) 40 MG tablet Take 1 tablet (40 mg total) by mouth daily as needed for fluid or edema (shortness of breath). 90 tablet 3   losartan (COZAAR) 25 MG tablet Take 1 tablet (25 mg total) by mouth daily. 30 tablet 0   methocarbamol (ROBAXIN) 500 MG tablet Take 1 tablet (500 mg total) by mouth every 8 (eight) hours as needed for muscle spasms. 15 tablet 0   pantoprazole (PROTONIX) 40 MG tablet Take 1 tablet (40 mg total) by mouth daily. Future refills need to be from PCP! (Patient taking differently: Take 40 mg by mouth daily.) 30 tablet 1   potassium chloride (KLOR-CON) 10 MEQ tablet Take 2 tablets (20 mEq total) by mouth daily.  180 tablet 3   SPIRIVA RESPIMAT 1.25 MCG/ACT AERS Take 2 puffs by mouth daily.     spironolactone (ALDACTONE) 25 MG tablet Take 25 mg by mouth daily.     No current facility-administered medications for this visit.    Allergies:   Aldomet [methyldopa], Brilinta [ticagrelor], Other, Coumadin [warfarin sodium], Desyrel [trazodone], Eliquis [apixaban], Entresto [sacubitril-valsartan], Zestril [lisinopril], and Penicillins    Social History:  The patient  reports that she has been smoking cigarettes. She has been smoking an average of .5 packs per day. She has never used smokeless tobacco. She reports that she does not currently use drugs after having used the following drugs: Marijuana. She reports that she does not drink alcohol.   Family History:  The patient's family history includes Bladder Cancer in her mother; CAD (age of onset: 22)  in her mother; CAD (age of onset: 62) in her father; Heart attack in her father and mother; Heart disease in her father and mother; Hypertension in her mother; Lung cancer in her mother; Stroke in her father and mother.    ROS:  Please see the history of present illness.   Otherwise, review of systems are positive for none.   All other systems are reviewed and negative.    PHYSICAL EXAM: VS:  BP 115/72   Pulse 73   Ht 5\' 1"  (1.549 m)   Wt 101 lb 12.8 oz (46.2 kg)   SpO2 98%   BMI 19.23 kg/m  , BMI Body mass index is 19.23 kg/m. GEN: Well nourished, well developed, in no acute distress  HEENT: normal  Neck: no JVD,  or masses.  Right carotid bruit.  There is a bruit at the left subclavian artery area. Cardiac: RRR; no rubs, or gallops,no edema .  1 out of 6 systolic murmur at the base. Respiratory:  clear to auscultation bilaterally, normal work of breathing GI: soft, nontender, nondistended, + BS MS: no deformity or atrophy  Skin: warm and dry, no rash Neuro:  Strength and sensation are intact Psych: euthymic mood, full affect Vascular: Radial pulses +2 on the right and mildly diminished on the left.   Femoral pulses normal bilaterally.  However, she has a surgical scar in the right groin from previous surgical repair for bleeding from the femoral artery.     EKG:  EKG is not ordered today.    Recent Labs: 08/14/2021: B Natriuretic Peptide 462.0 08/28/2021: ALT 15 08/29/2021: BUN 14; Creatinine, Ser 1.04; Hemoglobin 14.5; Platelets 163; Potassium 3.8; Sodium 139    Lipid Panel    Component Value Date/Time   CHOL 132 03/07/2019 0923   TRIG 62 03/07/2019 0923   HDL 40 03/07/2019 0923   CHOLHDL 3.3 03/07/2019 0923   CHOLHDL 3.6 07/23/2018 1017   VLDL 21 07/23/2018 1017   LDLCALC 79 03/07/2019 0923      Wt Readings from Last 3 Encounters:  09/13/21 101 lb 12.8 oz (46.2 kg)  08/28/21 98 lb 6.4 oz (44.6 kg)  08/19/21 100 lb 3.2 oz (45.5 kg)           No data to  display            ASSESSMENT AND PLAN:  1.  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  Due to progression, I recommend proceeding with aortic arch angiography and possible endovascular intervention on the left subclavian artery.  I discussed the procedure in details  as well as risks and benefits.  Planned access is via the left common femoral artery given that she has a surgical scar on the right side from prior surgical repair for bleeding after cardiac cath.  She is already on dual antiplatelet therapy.  I discussed the procedure in details as well as risks and benefits.    2.  Coronary artery disease involving native coronary arteries with other forms of angina angina: She seems to be stable overall.  3.  Chronic systolic heart failure: She appears to be euvolemic.  Some of her heart failure medications can likely be uptitrated as long as we rely on blood pressure readings in the right arm.  4.  Status post mitral valve repair: Stable.  5.  Tobacco use: I discussed with her the importance of smoking cessation and provided her with instructions.  6.  Hyperlipidemia: She is on high-dose atorvastatin.  Recommended target LDL of less than 70.    Disposition:   Proceed with aortic arch angiogram and follow-up with me in 1 month.  Signed,  Lorine Bears, MD  09/13/2021 8:48 AM    [facility]

## 2021-09-13 ENCOUNTER — Encounter: Payer: Self-pay | Admitting: Cardiovascular Disease

## 2021-09-13 ENCOUNTER — Ambulatory Visit: Payer: Medicare HMO | Attending: Internal Medicine | Admitting: Internal Medicine

## 2021-09-13 ENCOUNTER — Encounter: Payer: Self-pay | Admitting: Internal Medicine

## 2021-09-13 ENCOUNTER — Ambulatory Visit (INDEPENDENT_AMBULATORY_CARE_PROVIDER_SITE_OTHER): Payer: Medicare HMO | Admitting: Cardiovascular Disease

## 2021-09-13 VITALS — BP 122/72 | HR 72 | Ht 61.0 in | Wt 102.0 lb

## 2021-09-13 VITALS — BP 115/72 | HR 73 | Ht 61.0 in | Wt 101.8 lb

## 2021-09-13 DIAGNOSIS — I5022 Chronic systolic (congestive) heart failure: Secondary | ICD-10-CM

## 2021-09-13 DIAGNOSIS — I771 Stricture of artery: Secondary | ICD-10-CM

## 2021-09-13 DIAGNOSIS — I10 Essential (primary) hypertension: Secondary | ICD-10-CM

## 2021-09-13 DIAGNOSIS — I25118 Atherosclerotic heart disease of native coronary artery with other forms of angina pectoris: Secondary | ICD-10-CM

## 2021-09-13 DIAGNOSIS — K219 Gastro-esophageal reflux disease without esophagitis: Secondary | ICD-10-CM

## 2021-09-13 DIAGNOSIS — Z95810 Presence of automatic (implantable) cardiac defibrillator: Secondary | ICD-10-CM | POA: Diagnosis not present

## 2021-09-13 DIAGNOSIS — E785 Hyperlipidemia, unspecified: Secondary | ICD-10-CM

## 2021-09-13 MED ORDER — NEXLETOL 180 MG PO TABS: 180.0000 mg | ORAL_TABLET | Freq: Every day | ORAL | 3 refills | Status: DC

## 2021-09-13 MED ORDER — PANTOPRAZOLE SODIUM 40 MG PO TBEC: 40.0000 mg | DELAYED_RELEASE_TABLET | Freq: Every day | ORAL | 3 refills | Status: DC

## 2021-09-13 MED ORDER — SODIUM CHLORIDE 0.9% FLUSH: 3.0000 mL | Freq: Two times a day (BID) | INTRAVENOUS | Status: DC

## 2021-09-13 MED ORDER — SPIRONOLACTONE 25 MG PO TABS: 25.0000 mg | ORAL_TABLET | Freq: Every day | ORAL | 3 refills | Status: DC

## 2021-09-13 NOTE — Patient Instructions (Addendum)
Medication Instructions:  Your physician recommends that you continue on your current medications as directed. Please refer to the Current Medication list given to you today.  *If you need a refill on your cardiac medications before your next appointment, please call your pharmacy*   Lab Work: None   Testing/Procedures:  [place] A DEPT OF [place]. [facility] [address] A DEPT OF [place]. [facility] [address] [id number] [facility] [place] [address] Dept: [phone] Loc: [phone]  Lindsay Camacho  09/13/2021  You are scheduled for a Peripheral Angiogram on Wednesday, September 20 with Dr. Lorine Bears.  1. Please arrive at the [facility] (Main Entrance A) at [facility]: [address] [place], [place] [address] at 6:30 AM (This time is two hours before your procedure to ensure your preparation). Free valet parking service is available.   Special note: Every effort is made to have your procedure done on time. Please understand that emergencies sometimes delay scheduled procedures.  2. Diet: Do not eat solid foods after midnight.  The patient may have clear liquids until 5am upon the day of the procedure.  3. Labs: You will need to have blood drawn on 08/29/21  4. Medication instructions in preparation for your procedure:   Contrast Allergy: No  On the morning of your procedure, take your Aspirin and any morning medicines NOT listed above.  You may use sips of water.  5. Plan for one night stay--bring personal belongings. 6. Bring a current list of your medications and current insurance cards. 7. You MUST have a responsible person to drive you home. 8. Someone MUST be with you the first 24 hours after you arrive home or your discharge will be delayed. 9. Please wear clothes that are easy to get on and off and wear slip-on shoes.  Thank you for allowing [place] to care for you!   -- [place]  Invasive Cardiovascular services    Follow-Up: At [facility], you and your health needs are our priority.  As part of our continuing mission to provide you with exceptional heart care, we have created designated Provider Care Teams.  These Care Teams include your primary Cardiologist (physician) and Advanced Practice Providers (APPs -  Physician Assistants and Nurse Practitioners) who all work together to provide you with the care you need, when you need it.  We recommend signing up for the patient portal called "MyChart".  Sign up information is provided on this After Visit Summary.  MyChart is used to connect with patients for Virtual Visits (Telemedicine).  Patients are able to view lab/test results, encounter notes, upcoming appointments, etc.  Non-urgent messages can be sent to your provider as well.   To learn more about what you can do with MyChart, go to [email or web address].    Your next appointment:   1 month(s)  The format for your next appointment:   In Person  Provider:   Lorine Bears, MD   Other Instructions   Important Information About Sugar

## 2021-09-13 NOTE — Patient Instructions (Addendum)
Medication Instructions:  Your physician recommends that you continue on your current medications as directed. Please refer to the Current Medication list given to you today.  *If you need a refill on your cardiac medications before your next appointment, please call your pharmacy*  Lab Work: None ordered.  If you have labs (blood work) drawn today and your tests are completely normal, you will receive your results only by: MyChart Message (if you have MyChart) OR A paper copy in the mail If you have any lab test that is abnormal or we need to change your treatment, we will call you to review the results.  Testing/Procedures: None ordered.  Follow-Up: At [facility], you and your health needs are our priority.  As part of our continuing mission to provide you with exceptional heart care, we have created designated Provider Care Teams.  These Care Teams include your primary Cardiologist (physician) and Advanced Practice Providers (APPs -  Physician Assistants and Nurse Practitioners) who all work together to provide you with the care you need, when you need it.  We recommend signing up for the patient portal called "MyChart".  Sign up information is provided on this After Visit Summary.  MyChart is used to connect with patients for Virtual Visits (Telemedicine).  Patients are able to view lab/test results, encounter notes, upcoming appointments, etc.  Non-urgent messages can be sent to your provider as well.   To learn more about what you can do with MyChart, go to [email or web address].    Your next appointment:   1 year(s)  The format for your next appointment:   In Person  Provider:   Francis Dowse, PA-C Doreatha Martin, PA-C  Remote monitoring is used to monitor your ICD from home. This monitoring reduces the number of office visits required to check your device to one time per year. It allows [place] to keep an eye on the functioning of your device to ensure it is working  properly. You are scheduled for a device check from home on 11/14/2021. You may send your transmission at any time that day. If you have a wireless device, the transmission will be sent automatically. After your physician reviews your transmission, you will receive a postcard with your next transmission date.  Important Information About Sugar

## 2021-09-13 NOTE — Progress Notes (Signed)
HPI Ms. Lindsay Camacho returns today for followup of chronic systolic heart failure, s/p ICD insertion. She has a h/o LBB, and tobacco abuse which is now in remission. She has a remote LCX MI with stenting. She has not had any palpitations or ICD therapies. She denies high bp as home. She is afraid of kidney failure. She has not had any chest pain or sob. No edema. She has been found to have atherosclerotic vascular disease and is pending angiography of the upper arm vessels.  Allergies  Allergen Reactions   Aldomet [Methyldopa] Other (See Comments)    Severe hypotension   Brilinta [Ticagrelor] Shortness Of Breath   Other Other (See Comments)    Nuclear Stress Test Medication caused seizures   Coumadin [Warfarin Sodium] Swelling    Arm swelling   Desyrel [Trazodone] Other (See Comments)    Could not wake up from medication, comatosed   Eliquis [Apixaban] Other (See Comments)    BP got too low   Entresto [Sacubitril-Valsartan] Hypertension    Hypotension    Zestril [Lisinopril]     Unknown reaction   Penicillins Rash    Has patient had a PCN reaction causing immediate rash, facial/tongue/throat swelling, SOB or lightheadedness with hypotension: Yes Has patient had a PCN reaction causing severe rash involving mucus membranes or skin necrosis: No Has patient had a PCN reaction that required hospitalization: No Has patient had a PCN reaction occurring within the last 10 years: No If all of the above answers are "NO", then may proceed with Cephalosporin use.      Current Outpatient Medications  Medication Sig Dispense Refill   aspirin EC 81 MG tablet Take 81 mg by mouth daily.     atorvastatin (LIPITOR) 80 MG tablet Take 1 tablet (80 mg total) by mouth daily. 90 tablet 3   Bempedoic Acid (NEXLETOL) 180 MG TABS Take 180 mg by mouth daily. 90 tablet 3   carvedilol (COREG) 6.25 MG tablet Take 1 tablet (6.25 mg total) by mouth 2 (two) times daily. 180 tablet 3   clopidogrel (PLAVIX) 75 MG  tablet Take 1 tablet (75 mg total) by mouth daily. 90 tablet 3   ergocalciferol (VITAMIN D2) 1.25 MG (50000 UT) capsule Take 50,000 Units by mouth once a week.     furosemide (LASIX) 40 MG tablet Take 1 tablet (40 mg total) by mouth daily as needed for fluid or edema (shortness of breath). 90 tablet 3   losartan (COZAAR) 25 MG tablet Take 1 tablet (25 mg total) by mouth daily. 30 tablet 0   methocarbamol (ROBAXIN) 500 MG tablet Take 1 tablet (500 mg total) by mouth every 8 (eight) hours as needed for muscle spasms. 15 tablet 0   pantoprazole (PROTONIX) 40 MG tablet Take 1 tablet (40 mg total) by mouth daily. 90 tablet 3   potassium chloride (KLOR-CON) 10 MEQ tablet Take 2 tablets (20 mEq total) by mouth daily. 180 tablet 3   SPIRIVA RESPIMAT 1.25 MCG/ACT AERS Take 2 puffs by mouth daily.     spironolactone (ALDACTONE) 25 MG tablet Take 1 tablet (25 mg total) by mouth daily. 90 tablet 3   Current Facility-Administered Medications  Medication Dose Route Frequency Provider Last Rate Last Admin   sodium chloride flush (NS) 0.9 % injection 3 mL  3 mL Intravenous Q12H Iran Ouch, MD         Past Medical History:  Diagnosis Date   Acute myocardial infarction of other lateral wall, initial  episode of care 01/2013   DES CFX   Aortoiliac occlusive disease (HCC) 08/22/2016   CAD (coronary artery disease) 11/13/2011   50% ? 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   Past Surgical History:  Procedure Laterality Date   APPENDECTOMY     BIV ICD INSERTION CRT-D N/A 01/24/2017   Procedure: BIV ICD INSERTION CRT-D;  Surgeon: Marinus Maw, MD;  Location: [facility] INVASIVE CV LAB;  Service: Cardiovascular;  Laterality: N/A;   CARDIAC CATHETERIZATION  04/14/2013   Non-obstructive disease, patent CFX stent   CARDIAC CATHETERIZATION N/A 09/21/2014   Procedure: Left Heart Cath and Coronary Angiography;  Surgeon: Lennette Bihari, MD;  Location: [facility] INVASIVE CV LAB;  Service: Cardiovascular;  Laterality: N/A;   CORONARY ANGIOPLASTY WITH STENT PLACEMENT  01/17/2013   mild disease except 99% CFX, rx with  2.5 x 28 Alpine drug-eluting stent    GROIN DEBRIDEMENT Right 04/14/2013   Procedure: Emergency Evacuation of Retroperitoneal Hematoma and Repair Right External Iliac Artery Pseudoaneurysm    ;  Surgeon: Sherren Kerns, MD;  Location: [facility] OR;  Service: Vascular;  Laterality: Right;   LEFT HEART CATHETERIZATION WITH CORONARY ANGIOGRAM N/A 01/18/2013   Procedure: LEFT HEART CATHETERIZATION WITH CORONARY ANGIOGRAM;  Surgeon: Corky Crafts, MD;  Location: [facility] CATH LAB;  Service: Cardiovascular;  Laterality: N/A;   LEFT HEART CATHETERIZATION WITH CORONARY ANGIOGRAM N/A 04/14/2013   Procedure: LEFT HEART CATHETERIZATION WITH CORONARY ANGIOGRAM;  Surgeon: Corky Crafts, MD;  Location: [facility] CATH LAB;  Service: Cardiovascular;  Laterality: N/A;   MITRAL VALVE REPAIR Right 08/25/2016   Procedure: MINIMALLY INVASIVE MITRAL VALVE REPAIR;  Surgeon: Purcell Nails, MD;  Location: [facility] OR;  Service: Open Heart Surgery;  Laterality: Right;   MULTIPLE EXTRACTIONS WITH ALVEOLOPLASTY N/A  08/23/2016   Procedure: Extraction of tooth #'s 17, 20-23, 26-29 with alveoloplasty;  Surgeon: [name], DDS;  Location: [facility] OR;  Service: Oral Surgery;  Laterality: N/A;   PERCUTANEOUS CORONARY STENT INTERVENTION (PCI-S)  01/18/2013   Procedure: PERCUTANEOUS CORONARY STENT INTERVENTION (PCI-S);  Surgeon: Corky Crafts, MD;  Location: [facility] CATH LAB;  Service: Cardiovascular;;   RIGHT/LEFT HEART CATH AND CORONARY ANGIOGRAPHY N/A 08/21/2016   Procedure: RIGHT/LEFT HEART CATH AND CORONARY ANGIOGRAPHY;  Surgeon: Kathleene Hazel, MD;  Location: [facility] INVASIVE CV LAB;  Service: Cardiovascular;  Laterality: N/A;   TEE WITHOUT CARDIOVERSION N/A 03/06/2014   Procedure: TRANSESOPHAGEAL ECHOCARDIOGRAM (TEE);  Surgeon: Lars Masson, MD;  Location: [facility] ENDOSCOPY;  Service: Cardiovascular;  Laterality: N/A;   TEE WITHOUT CARDIOVERSION N/A 08/18/2016   Procedure: TRANSESOPHAGEAL ECHOCARDIOGRAM (  TEE);  Surgeon: Lewayne Bunting, MD;  Location: [facility] ENDOSCOPY;  Service: Cardiovascular;  Laterality: N/A;   TEE WITHOUT CARDIOVERSION N/A 08/25/2016   Procedure: TRANSESOPHAGEAL ECHOCARDIOGRAM (TEE);  Surgeon: Purcell Nails, MD;  Location: [facility] OR;  Service: Open Heart Surgery;  Laterality: N/A;   TUBAL LIGATION       Family History  Problem Relation Age of Onset   CAD Mother 22   Lung cancer Mother    Bladder Cancer Mother    Stroke Mother    Heart disease Mother        Before age 100   Hypertension Mother    Heart attack Mother    CAD Father 63   Heart disease Father        Before age 24   Heart attack Father    Stroke Father        Bleeding stroke     Social History   Socioeconomic History   Marital status: Single    Spouse name: Not on file   Number of children: Not on file   Years of education: Not on file   Highest education level: Not on file  Occupational History   Not on file  Tobacco Use   Smoking status: Every Day    Packs/day: 0.50    Types: Cigarettes   Smokeless  tobacco: Never  Vaping Use   Vaping Use: Never used  Substance and Sexual Activity   Alcohol use: No    Alcohol/week: 0.0 standard drinks of alcohol   Drug use: Not Currently    Types: Marijuana   Sexual activity: Yes    Birth control/protection: None  Other Topics Concern   Not on file  Social History Narrative   Not on file   Social Determinants of Health   Financial Resource Strain: Not on file  Food Insecurity: Not on file  Transportation Needs: Not on file  Physical Activity: Not on file  Stress: Not on file  Social Connections: Not on file  Intimate Partner Violence: Not on file     BP 122/72   Pulse 72   Ht 5\' 1"  (1.549 m)   Wt 102 lb (46.3 kg)   BMI 19.27 kg/m   Physical Exam:  Well appearing NAD HEENT: Unremarkable Neck:  No JVD, no thyromegally Lymphatics:  No adenopathy Back:  No CVA tenderness Lungs:  scattered rales HEART:  Regular rate rhythm, no murmurs, no rubs, no clicks Abd:  soft, positive bowel sounds, no organomegally, no rebound, no guarding Ext:  2 plus pulses, no edema, no cyanosis, no clubbing Skin:  No rashes no nodules Neuro:  CN II through XII intact, motor grossly intact   DEVICE  Normal device function.  See PaceArt for details.   Assess/Plan:  1. Chronic systolic heart failure - her symptoms remain class 2. SHe will continue her current meds. 2. ICD - her medtronic biv ICD is working normally. We will recheck in several months. 3. HTN - her bp is high in the office but she has been non-compliant. She is encouraged to avoid salty foods and to restart her meds. 4. Peripheral vascular disease - she is over 3 years in remission of her smoking. She denies claudication.    .D.

## 2021-09-14 LAB — CUP PACEART INCLINIC DEVICE CHECK
Battery Remaining Longevity: 22 mo
Battery Voltage: 2.92 V
Brady Statistic AP VP Percent: 0.09 %
Brady Statistic AP VS Percent: 0.01 %
Brady Statistic AS VP Percent: 99.68 %
Brady Statistic AS VS Percent: 0.21 %
Brady Statistic RA Percent Paced: 0.1 %
Brady Statistic RV Percent Paced: 99.48 %
Date Time Interrogation Session: 20230912121900
HighPow Impedance: 61 Ohm
Implantable Lead Implant Date: 20190124
Implantable Lead Implant Date: 20190124
Implantable Lead Implant Date: 20190124
Implantable Lead Location: 753859
Implantable Lead Location: 753860
Implantable Lead Location: 753860
Implantable Lead Model: 3830
Implantable Lead Model: 5076
Implantable Lead Model: 6935
Implantable Pulse Generator Implant Date: 20190124
Lead Channel Impedance Value: 209 Ohm
Lead Channel Impedance Value: 266 Ohm
Lead Channel Impedance Value: 380 Ohm
Lead Channel Impedance Value: 399 Ohm
Lead Channel Impedance Value: 437 Ohm
Lead Channel Impedance Value: 456 Ohm
Lead Channel Pacing Threshold Amplitude: 0.5 V
Lead Channel Pacing Threshold Amplitude: 0.625 V
Lead Channel Pacing Threshold Amplitude: 0.75 V
Lead Channel Pacing Threshold Amplitude: 0.875 V
Lead Channel Pacing Threshold Amplitude: 1 V
Lead Channel Pacing Threshold Amplitude: 1.125 V
Lead Channel Pacing Threshold Pulse Width: 0.4 ms
Lead Channel Pacing Threshold Pulse Width: 0.4 ms
Lead Channel Pacing Threshold Pulse Width: 0.4 ms
Lead Channel Pacing Threshold Pulse Width: 0.4 ms
Lead Channel Pacing Threshold Pulse Width: 0.5 ms
Lead Channel Pacing Threshold Pulse Width: 0.5 ms
Lead Channel Sensing Intrinsic Amplitude: 1.25 mV
Lead Channel Sensing Intrinsic Amplitude: 1.5 mV
Lead Channel Sensing Intrinsic Amplitude: 6.875 mV
Lead Channel Sensing Intrinsic Amplitude: 6.875 mV
Lead Channel Setting Pacing Amplitude: 1.5 V
Lead Channel Setting Pacing Amplitude: 1.5 V
Lead Channel Setting Pacing Amplitude: 2.5 V
Lead Channel Setting Pacing Pulse Width: 0.4 ms
Lead Channel Setting Pacing Pulse Width: 0.5 ms
Lead Channel Setting Sensing Sensitivity: 0.3 mV

## 2021-09-20 ENCOUNTER — Telehealth: Payer: Self-pay | Admitting: *Deleted

## 2021-09-20 NOTE — Telephone Encounter (Signed)
Call placed to patient to review procedure instructions, no answer, voicemail message. °

## 2021-09-20 NOTE — Telephone Encounter (Signed)
Aortic Arch Angiogram scheduled at [facility] for: Wednesday September 21, 2021 8:30 AM Arrival time and place: [place] Entrance A at: 6:30 AM  Nothing to eat after midnight prior to procedure, clear liquids until 5 AM day of procedure.  Medication instructions: -Hold:  Lasix/KCl/Spironolactone -AM of procedure -Except hold medications usual morning medications can be taken with sips of water including aspirin 81 mg and Plavix 75 mg.  Confirmed patient has responsible adult to drive home post procedure and be with patient first 24 hours after arriving home.  Patient reports no new symptoms concerning for COVID-19 in the past 10 days.  Left message for patient to call back to review procedure instructions

## 2021-09-20 NOTE — Telephone Encounter (Signed)
Call placed to patient to review procedure instructions, no answer. ?

## 2021-09-21 ENCOUNTER — Ambulatory Visit (HOSPITAL_COMMUNITY)
Admission: RE | Admit: 2021-09-21 | Discharge: 2021-09-22 | Disposition: A | Discharge status: Home / Self Care | Payer: Medicare HMO | Attending: Cardiovascular Disease | Admitting: Cardiovascular Disease

## 2021-09-21 ENCOUNTER — Other Ambulatory Visit: Payer: Self-pay

## 2021-09-21 ENCOUNTER — Encounter (HOSPITAL_COMMUNITY): Payer: Self-pay | Admitting: Cardiovascular Disease

## 2021-09-21 ENCOUNTER — Encounter (HOSPITAL_COMMUNITY)
Admission: RE | Disposition: A | Discharge status: Home / Self Care | Payer: Self-pay | Source: Home / Self Care | Attending: Cardiovascular Disease

## 2021-09-21 DIAGNOSIS — I2510 Atherosclerotic heart disease of native coronary artery without angina pectoris: Secondary | ICD-10-CM | POA: Insufficient documentation

## 2021-09-21 DIAGNOSIS — Z952 Presence of prosthetic heart valve: Secondary | ICD-10-CM | POA: Diagnosis not present

## 2021-09-21 DIAGNOSIS — Z955 Presence of coronary angioplasty implant and graft: Secondary | ICD-10-CM | POA: Insufficient documentation

## 2021-09-21 DIAGNOSIS — E785 Hyperlipidemia, unspecified: Secondary | ICD-10-CM | POA: Diagnosis not present

## 2021-09-21 DIAGNOSIS — Z95810 Presence of automatic (implantable) cardiac defibrillator: Secondary | ICD-10-CM | POA: Diagnosis not present

## 2021-09-21 DIAGNOSIS — Z7902 Long term (current) use of antithrombotics/antiplatelets: Secondary | ICD-10-CM | POA: Diagnosis not present

## 2021-09-21 DIAGNOSIS — I739 Peripheral vascular disease, unspecified: Secondary | ICD-10-CM | POA: Diagnosis not present

## 2021-09-21 DIAGNOSIS — I708 Atherosclerosis of other arteries: Secondary | ICD-10-CM | POA: Diagnosis present

## 2021-09-21 DIAGNOSIS — I771 Stricture of artery: Secondary | ICD-10-CM | POA: Insufficient documentation

## 2021-09-21 DIAGNOSIS — I252 Old myocardial infarction: Secondary | ICD-10-CM | POA: Insufficient documentation

## 2021-09-21 DIAGNOSIS — I110 Hypertensive heart disease with heart failure: Secondary | ICD-10-CM | POA: Diagnosis not present

## 2021-09-21 DIAGNOSIS — F17210 Nicotine dependence, cigarettes, uncomplicated: Secondary | ICD-10-CM | POA: Insufficient documentation

## 2021-09-21 DIAGNOSIS — G458 Other transient cerebral ischemic attacks and related syndromes: Secondary | ICD-10-CM

## 2021-09-21 DIAGNOSIS — I5042 Chronic combined systolic (congestive) and diastolic (congestive) heart failure: Secondary | ICD-10-CM | POA: Insufficient documentation

## 2021-09-21 DIAGNOSIS — Z79899 Other long term (current) drug therapy: Secondary | ICD-10-CM | POA: Diagnosis not present

## 2021-09-21 HISTORY — PX: UPPER EXTREMITY INTERVENTION: CATH118271

## 2021-09-21 HISTORY — PX: AORTIC ARCH ANGIOGRAPHY: CATH118224

## 2021-09-21 LAB — POCT ACTIVATED CLOTTING TIME
Activated Clotting Time: 227 seconds
Activated Clotting Time: 233 seconds

## 2021-09-21 SURGERY — AORTIC ARCH ANGIOGRAPHY
Anesthesia: LOCAL

## 2021-09-21 MED ORDER — SODIUM CHLORIDE 0.9 % WEIGHT BASED INFUSION
1.0000 mL/kg/h | INTRAVENOUS | Status: AC
  Administered 2021-09-21: 1 mL/kg/h via INTRAVENOUS

## 2021-09-21 MED ORDER — SPIRONOLACTONE 25 MG PO TABS
25.0000 mg | ORAL_TABLET | Freq: Every day | ORAL | Status: DC
  Administered 2021-09-21 – 2021-09-22 (×2): 25 mg via ORAL
  Filled 2021-09-21 (×2): qty 1

## 2021-09-21 MED ORDER — ONDANSETRON HCL 4 MG/2ML IJ SOLN: 4.0000 mg | Freq: Four times a day (QID) | INTRAMUSCULAR | Status: DC | PRN

## 2021-09-21 MED ORDER — LOSARTAN POTASSIUM 25 MG PO TABS
25.0000 mg | ORAL_TABLET | Freq: Every day | ORAL | Status: DC
  Administered 2021-09-21 – 2021-09-22 (×2): 25 mg via ORAL
  Filled 2021-09-21: qty 1
  Filled 2021-09-21: qty 0.5

## 2021-09-21 MED ORDER — CLOPIDOGREL BISULFATE 75 MG PO TABS
75.0000 mg | ORAL_TABLET | Freq: Every day | ORAL | Status: DC
  Administered 2021-09-22: 75 mg via ORAL
  Filled 2021-09-21: qty 1

## 2021-09-21 MED ORDER — SODIUM CHLORIDE 0.9% FLUSH
3.0000 mL | Freq: Two times a day (BID) | INTRAVENOUS | Status: DC
  Administered 2021-09-21: 3 mL via INTRAVENOUS

## 2021-09-21 MED ORDER — CARVEDILOL 6.25 MG PO TABS
6.2500 mg | ORAL_TABLET | Freq: Two times a day (BID) | ORAL | Status: DC
  Administered 2021-09-21 – 2021-09-22 (×3): 6.25 mg via ORAL
  Filled 2021-09-21: qty 1
  Filled 2021-09-21: qty 2
  Filled 2021-09-21: qty 1

## 2021-09-21 MED ORDER — HEPARIN SODIUM (PORCINE) 1000 UNIT/ML IJ SOLN
INTRAMUSCULAR | Status: AC
  Filled 2021-09-21: qty 10

## 2021-09-21 MED ORDER — FUROSEMIDE 40 MG PO TABS: 40.0000 mg | ORAL_TABLET | Freq: Every day | ORAL | Status: DC | PRN

## 2021-09-21 MED ORDER — ASPIRIN 81 MG PO CHEW
81.0000 mg | CHEWABLE_TABLET | ORAL | Status: AC
  Administered 2021-09-21: 81 mg via ORAL
  Filled 2021-09-21: qty 1

## 2021-09-21 MED ORDER — BEMPEDOIC ACID 180 MG PO TABS: 180.0000 mg | ORAL_TABLET | Freq: Every day | ORAL | Status: DC

## 2021-09-21 MED ORDER — SODIUM CHLORIDE 0.9 % WEIGHT BASED INFUSION: 1.0000 mL/kg/h | INTRAVENOUS | Status: DC

## 2021-09-21 MED ORDER — IODIXANOL 320 MG/ML IV SOLN
INTRAVENOUS | Status: DC | PRN
  Administered 2021-09-21: 110 mL via INTRA_ARTERIAL

## 2021-09-21 MED ORDER — SODIUM CHLORIDE 0.9% FLUSH
3.0000 mL | Freq: Two times a day (BID) | INTRAVENOUS | Status: DC
  Administered 2021-09-21 (×2): 3 mL via INTRAVENOUS

## 2021-09-21 MED ORDER — HEPARIN (PORCINE) IN NACL 1000-0.9 UT/500ML-% IV SOLN
INTRAVENOUS | Status: DC | PRN
  Administered 2021-09-21 (×2): 500 mL

## 2021-09-21 MED ORDER — FENTANYL CITRATE (PF) 100 MCG/2ML IJ SOLN
INTRAMUSCULAR | Status: AC
  Filled 2021-09-21: qty 2

## 2021-09-21 MED ORDER — HYDRALAZINE HCL 20 MG/ML IJ SOLN
INTRAMUSCULAR | Status: AC
  Filled 2021-09-21: qty 1

## 2021-09-21 MED ORDER — MIDAZOLAM HCL 2 MG/2ML IJ SOLN
INTRAMUSCULAR | Status: AC
  Filled 2021-09-21: qty 2

## 2021-09-21 MED ORDER — SODIUM CHLORIDE 0.9 % IV SOLN: 250.0000 mL | INTRAVENOUS | Status: DC | PRN

## 2021-09-21 MED ORDER — HEPARIN SODIUM (PORCINE) 1000 UNIT/ML IJ SOLN
INTRAMUSCULAR | Status: DC | PRN
  Administered 2021-09-21: 2000 [IU] via INTRAVENOUS
  Administered 2021-09-21: 1000 [IU] via INTRAVENOUS
  Administered 2021-09-21: 2000 [IU] via INTRAVENOUS

## 2021-09-21 MED ORDER — OXYCODONE-ACETAMINOPHEN 5-325 MG PO TABS
1.0000 | ORAL_TABLET | Freq: Three times a day (TID) | ORAL | Status: DC | PRN
  Administered 2021-09-21: 2 via ORAL
  Filled 2021-09-21: qty 2

## 2021-09-21 MED ORDER — MIDAZOLAM HCL 2 MG/2ML IJ SOLN
INTRAMUSCULAR | Status: DC | PRN
  Administered 2021-09-21 (×3): 1 mg via INTRAVENOUS

## 2021-09-21 MED ORDER — CLOPIDOGREL BISULFATE 75 MG PO TABS
75.0000 mg | ORAL_TABLET | Freq: Once | ORAL | Status: AC
  Administered 2021-09-21: 75 mg via ORAL
  Filled 2021-09-21: qty 1

## 2021-09-21 MED ORDER — SODIUM CHLORIDE 0.9% FLUSH: 3.0000 mL | INTRAVENOUS | Status: DC | PRN

## 2021-09-21 MED ORDER — PANTOPRAZOLE SODIUM 40 MG PO TBEC: 40.0000 mg | DELAYED_RELEASE_TABLET | Freq: Every day | ORAL | Status: DC

## 2021-09-21 MED ORDER — ASPIRIN 81 MG PO CHEW: 81.0000 mg | CHEWABLE_TABLET | ORAL | Status: DC

## 2021-09-21 MED ORDER — HEPARIN (PORCINE) IN NACL 1000-0.9 UT/500ML-% IV SOLN
INTRAVENOUS | Status: AC
  Filled 2021-09-21: qty 1000

## 2021-09-21 MED ORDER — POTASSIUM CHLORIDE CRYS ER 10 MEQ PO TBCR
20.0000 meq | EXTENDED_RELEASE_TABLET | Freq: Every day | ORAL | Status: DC
  Administered 2021-09-21 – 2021-09-22 (×2): 20 meq via ORAL
  Filled 2021-09-21 (×2): qty 2

## 2021-09-21 MED ORDER — SODIUM CHLORIDE 0.9 % WEIGHT BASED INFUSION
3.0000 mL/kg/h | INTRAVENOUS | Status: DC
  Administered 2021-09-21: 3 mL/kg/h via INTRAVENOUS

## 2021-09-21 MED ORDER — ASPIRIN 81 MG PO TBEC
81.0000 mg | DELAYED_RELEASE_TABLET | Freq: Every day | ORAL | Status: DC
  Administered 2021-09-22: 81 mg via ORAL
  Filled 2021-09-21: qty 1

## 2021-09-21 MED ORDER — LIDOCAINE HCL (PF) 1 % IJ SOLN
INTRAMUSCULAR | Status: AC
  Filled 2021-09-21: qty 30

## 2021-09-21 MED ORDER — ACETAMINOPHEN 325 MG PO TABS
650.0000 mg | ORAL_TABLET | ORAL | Status: DC | PRN
  Administered 2021-09-21 (×2): 650 mg via ORAL
  Filled 2021-09-21: qty 2

## 2021-09-21 MED ORDER — FENTANYL CITRATE (PF) 100 MCG/2ML IJ SOLN
INTRAMUSCULAR | Status: DC | PRN
  Administered 2021-09-21 (×3): 25 ug via INTRAVENOUS

## 2021-09-21 MED ORDER — ACETAMINOPHEN 325 MG PO TABS
ORAL_TABLET | ORAL | Status: AC
  Filled 2021-09-21: qty 2

## 2021-09-21 MED ORDER — ATORVASTATIN CALCIUM 80 MG PO TABS
80.0000 mg | ORAL_TABLET | Freq: Every day | ORAL | Status: DC
  Administered 2021-09-21 – 2021-09-22 (×2): 80 mg via ORAL
  Filled 2021-09-21 (×2): qty 1

## 2021-09-21 MED ORDER — LIDOCAINE HCL (PF) 1 % IJ SOLN
INTRAMUSCULAR | Status: DC | PRN
  Administered 2021-09-21: 10 mL

## 2021-09-21 MED ORDER — VITAMIN D (ERGOCALCIFEROL) 1.25 MG (50000 UNIT) PO CAPS: 50000.0000 [IU] | ORAL_CAPSULE | ORAL | Status: DC

## 2021-09-21 MED ORDER — HYDRALAZINE HCL 20 MG/ML IJ SOLN
10.0000 mg | INTRAMUSCULAR | Status: DC | PRN
  Administered 2021-09-21: 10 mg via INTRAVENOUS

## 2021-09-21 SURGICAL SUPPLY — 25 items
BALLN MUSTANG 5.0X40 135 (BALLOONS) ×2
BALLN STERLING OTW 3X20X150 (BALLOONS) ×2
BALLOON MUSTANG 5.0X40 135 (BALLOONS) IMPLANT
BALLOON STERLING OTW 3X20X150 (BALLOONS) IMPLANT
CATH ANGIO 5F PIGTAIL 100CM (CATHETERS) IMPLANT
CATH INFINITI JR4 5F (CATHETERS) IMPLANT
CATH NAVICROSS ST .035X135CM (MICROCATHETER) IMPLANT
DEVICE CLOSURE MYNXGRIP 6/7F (Vascular Products) IMPLANT
KIT ENCORE 26 ADVANTAGE (KITS) IMPLANT
KIT MICROPUNCTURE NIT STIFF (SHEATH) IMPLANT
KIT PV (KITS) ×3 IMPLANT
SHEATH PINNACLE 5F 10CM (SHEATH) IMPLANT
SHEATH PINNACLE 6F 10CM (SHEATH) IMPLANT
SHEATH PROBE COVER 6X72 (BAG) IMPLANT
SHEATH SHUTTLE SELECT 6F (SHEATH) IMPLANT
STENT OMNILINK 7X29X135 (Permanent Stent) IMPLANT
STOPCOCK MORSE 400PSI 3WAY (MISCELLANEOUS) IMPLANT
SYR MEDRAD MARK 7 150ML (SYRINGE) ×3 IMPLANT
TRANSDUCER W/STOPCOCK (MISCELLANEOUS) ×3 IMPLANT
TRAY PV CATH (CUSTOM PROCEDURE TRAY) ×3 IMPLANT
TUBING CIL FLEX 10 FLL-RA (TUBING) IMPLANT
WIRE HITORQ VERSACORE ST 145CM (WIRE) IMPLANT
WIRE ROSEN-J .035X260CM (WIRE) IMPLANT
WIRE SHEPHERD 30G .018 (WIRE) IMPLANT
WIRE SHEPHERD 4G .018 (WIRE) IMPLANT

## 2021-09-21 NOTE — Progress Notes (Signed)
Pt arrived to 4E from cath lab. Pt A&Ox4. VSS. Pain 8/10. CHG done. Tele applied; CCMD notified.  Raelyn Number, RN

## 2021-09-21 NOTE — Interval H&P Note (Signed)
History and Physical Interval Note:  09/21/2021 8:44 AM  Lindsay Camacho  has presented today for surgery, with the diagnosis of left subclavian artery stenosis.  The various methods of treatment have been discussed with the patient and family. After consideration of risks, benefits and other options for treatment, the patient has consented to  Procedure(s): AORTIC ARCH ANGIOGRAPHY (N/A) as a surgical intervention.  The patient's history has been reviewed, patient examined, no change in status, stable for surgery.  I have reviewed the patient's chart and labs.  Questions were answered to the patient's satisfaction.     Kathlyn Sacramento

## 2021-09-22 ENCOUNTER — Other Ambulatory Visit: Payer: Self-pay | Admitting: Cardiology

## 2021-09-22 DIAGNOSIS — R0989 Other specified symptoms and signs involving the circulatory and respiratory systems: Secondary | ICD-10-CM

## 2021-09-22 DIAGNOSIS — I771 Stricture of artery: Secondary | ICD-10-CM | POA: Diagnosis not present

## 2021-09-22 DIAGNOSIS — I708 Atherosclerosis of other arteries: Secondary | ICD-10-CM | POA: Diagnosis not present

## 2021-09-22 LAB — LIPID PANEL
Cholesterol: 173 mg/dL (ref 0–200)
HDL: 28 mg/dL — ABNORMAL LOW (ref >40)
LDL Cholesterol: 126 mg/dL — ABNORMAL HIGH (ref 0–99)
Total CHOL/HDL Ratio: 6.2 RATIO
Triglycerides: 97 mg/dL (ref <150)
VLDL: 19 mg/dL (ref 0–40)

## 2021-09-22 NOTE — Progress Notes (Signed)
Pt discharged home.  All instructions given and reviewed, follow up appts in place.  All questions answered.

## 2021-09-22 NOTE — Discharge Summary (Addendum)
Discharge Summary    Patient ID: Lindsay Camacho MRN: [id number]; DOB: 1965-08-03  Admit date: 09/21/2021 Discharge date: 09/22/2021  PCP:  Oneita Hurt No   [facility] Providers Cardiologist:  Lance Muss, MD  Electrophysiologist:  Lewayne Bunting, MD     Discharge Diagnoses    Principal Problem:   Left subclavian artery occlusion  Diagnostic Studies/Procedures    Aortic arch angiogram: 09/21/2021  1.  Occluded proximal left subclavian artery with retrograde flow in the left vertebral artery providing collaterals to the left arm. 2.  Successful angioplasty and balloon expandable stent placement to the left subclavian artery.   Recommendations: Given the patient's elevated blood pressure and previous history of retroperitoneal bleed, will observe overnight and likely discharge home tomorrow if she remains stable. She is already on dual antiplatelet therapy. Obtain carotid Doppler in the next 2 weeks.  _____________   History of Present Illness     JALEI SHIBLEY is a 56 y.o. female with past medical history of CAD status post MI with PCI to left circumflex '15, hypertension, hyperlipidemia, severe MR status post MVR '18, subclavian stenosis, PPM who is followed by Dr. Moose Pass Sink as an outpatient for PVD.  She was seen back in February for left subclavian artery stenosis noted on carotid Doppler after she was found to have a carotid bruit.  Also complained of left arm claudication.  She was hospitalized recently with dizziness and elevated blood pressures with some left shoulder pain and arm discomfort.  CT angiogram confirmed severe proximal left subclavian artery stenosis.  She was seen back in the office on 9/12 with options discussed.  She was already on DAPT with aspirin/Plavix with recommendations to undergo outpatient aortic arch angiography.   Hospital Course     Presented on 9/20 and underwent aortic arch angiography showing occluded proximal left clavian artery with  retrograde flow in the left vertebral artery providing collaterals to the left arm.  Successful angioplasty with stent placement to the left subclavian artery.  She will be continued on DAPT with aspirin/Plavix.  We will need to obtain carotid Dopplers in the next 2 weeks.  She was observed overnight as she had elevated blood pressure and a history of peritoneal bleed in the past.  No complications noted.  Able to ambulate.   General: Well developed, well nourished, female appearing in no acute distress. Head: Normocephalic, atraumatic.  Neck: Supple without bruits, JVD. Lungs:  Resp regular and unlabored, CTA. Heart: RRR, S1, S2, no S3, S4, or murmur; no rub. Abdomen: Soft, non-tender, non-distended with normoactive bowel sounds. No hepatomegaly. No rebound/guarding. No obvious abdominal masses. Extremities: No clubbing, cyanosis, edema. Distal pedal pulses are 2+ bilaterally. Left femoral cath site stable without bruising or hematoma Neuro: Alert and oriented X 3. Moves all extremities spontaneously. Psych: Normal affect.   Did the patient have an acute coronary syndrome (MI, NSTEMI, STEMI, etc) this admission?:  No                               Did the patient have a percutaneous coronary intervention (stent / angioplasty)?:  No.       The patient will be scheduled for a TOC follow up appointment in 10-14 days.  A message has been sent to the [facility] and Scheduling Pool at the office where the patient should be seen for follow up.  _____________  Discharge Vitals Blood pressure 135/72, pulse 60, temperature  97.6 F (36.4 C), temperature source Oral, resp. rate 18, height 5\' 1"  (1.549 m), weight 46.3 kg, SpO2 98 %.  Filed Weights   09/21/21 0619  Weight: 46.3 kg    Labs & Radiologic Studies    CBC No results for input(s): "WBC", "NEUTROABS", "HGB", "HCT", "MCV", "PLT" in the last 72 hours. Basic Metabolic Panel No results for input(s): "NA", "K", "CL", "CO2", "GLUCOSE", "BUN",  "CREATININE", "CALCIUM", "MG", "PHOS" in the last 72 hours. Liver Function Tests No results for input(s): "AST", "ALT", "ALKPHOS", "BILITOT", "PROT", "ALBUMIN" in the last 72 hours. No results for input(s): "LIPASE", "AMYLASE" in the last 72 hours. High Sensitivity Troponin:   Recent Labs  Lab 08/28/21 0923 08/28/21 1116  TROPONINIHS 10 10    BNP Invalid input(s): "POCBNP" D-Dimer No results for input(s): "DDIMER" in the last 72 hours. Hemoglobin A1C No results for input(s): "HGBA1C" in the last 72 hours. Fasting Lipid Panel Recent Labs    09/22/21 0431  CHOL 173  HDL 28*  LDLCALC 126*  TRIG 97  CHOLHDL 6.2   Thyroid Function Tests No results for input(s): "TSH", "T4TOTAL", "T3FREE", "THYROIDAB" in the last 72 hours.  Invalid input(s): "FREET3" _____________  PERIPHERAL VASCULAR CATHETERIZATION  Result Date: 09/21/2021 1.  Occluded proximal left subclavian artery with retrograde flow in the left vertebral artery providing collaterals to the left arm. 2.  Successful angioplasty and balloon expandable stent placement to the left subclavian artery. Recommendations: Given the patient's elevated blood pressure and previous history of retroperitoneal bleed, will observe overnight and likely discharge home tomorrow if she remains stable. She is already on dual antiplatelet therapy. Obtain carotid Doppler in the next 2 weeks.   CUP PACEART INCLINIC DEVICE CHECK  Result Date: 09/14/2021 CRT-D device check in office. Thresholds and sensing consistent with previous device measurements. Lead impedance trends stable over time. AT/AF burden <0.1%. No ventricular arrhythmia episodes recorded. Patient bi-ventricularly pacing 92.2% of the time.  Device programmed with appropriate safety margins. Heart failure diagnostics reviewed and trends are stable for patient. Audible/vibratory alerts demonstrated for patient. No changes made this session. Estimated longevity _1.8 years.  Patient enrolled in  remote follow up. Plan to check device remotely in 3 months and see in office in 6 months. Patient education completed including shock plan.Leticia Penna, RN  ECHOCARDIOGRAM COMPLETE  Result Date: 09/01/2021    ECHOCARDIOGRAM REPORT   Patient Name:   Lindsay Camacho Date of Exam: 09/01/2021 Medical Rec #:  [id number]     Height:       61.0 in Accession #:    [id number]    Weight:       98.4 lb Date of Birth:  1965-06-10     BSA:          1.397 m Patient Age:    40 years      BP:           143/77 mmHg Patient Gender: F             HR:           63 bpm. Exam Location:  [address] Procedure: 2D Echo, Cardiac Doppler, Color Doppler and 3D Echo Indications:    Chronic Diastolic Congestive Heart Failure I50.32  History:        Patient has prior history of Echocardiogram examinations, most                 recent 03/31/2020. Cardiomyopathy, Previous Myocardial Infarction  and CAD, Defibrillator; Risk Factors:Hypertension and                 Dyslipidemia.                  Mitral Valve: prosthetic annuloplasty ring valve is present in                 the mitral position. Procedure Date: 08/25/2016.  Sonographer:    Thurman Coyer RDCS Referring Phys: Bernadette Hoit CONTE IMPRESSIONS  1. Left ventricular ejection fraction, by estimation, is 50 to 55%. Left ventricular ejection fraction by 3D volume is 50 %. The left ventricle has low normal function. The left ventricle has no regional wall motion abnormalities. There is mild left ventricular hypertrophy. Left ventricular diastolic function could not be evaluated.  2. Right ventricular systolic function is low normal. The right ventricular size is normal. There is normal pulmonary artery systolic pressure. The estimated right ventricular systolic pressure is 34.6 mmHg.  3. Left atrial size was moderately dilated.  4. The mitral valve has been repaired/replaced. Trivial mitral valve regurgitation. No evidence of mitral stenosis. The mean mitral valve gradient is 4.0  mmHg with average heart rate of 61 bpm. There is a prosthetic annuloplasty ring present in the mitral position. Procedure Date: 08/25/2016.  5. The tricuspid valve is abnormal. Tricuspid valve regurgitation is moderate.  6. The aortic valve is tricuspid. Aortic valve regurgitation is mild to moderate.  7. The inferior vena cava is normal in size with <50% respiratory variability, suggesting right atrial pressure of 8 mmHg. Comparison(s): Changes from prior study are noted. 03/31/2020: LVEF 50-55%, MV annuloplaty ring- mean gradient 2 mmHg. FINDINGS  Left Ventricle: Left ventricular ejection fraction, by estimation, is 50 to 55%. Left ventricular ejection fraction by 3D volume is 50 %. The left ventricle has low normal function. The left ventricle has no regional wall motion abnormalities. The left ventricular internal cavity size was normal in size. There is mild left ventricular hypertrophy. Abnormal (paradoxical) septal motion, consistent with RV pacemaker. Left ventricular diastolic function could not be evaluated due to mitral valve repair. Left ventricular diastolic function could not be evaluated. Right Ventricle: The right ventricular size is normal. No increase in right ventricular wall thickness. Right ventricular systolic function is low normal. There is normal pulmonary artery systolic pressure. The tricuspid regurgitant velocity is 2.58 m/s,  and with an assumed right atrial pressure of 8 mmHg, the estimated right ventricular systolic pressure is 34.6 mmHg. Left Atrium: Left atrial size was moderately dilated. Right Atrium: Right atrial size was normal in size. Pericardium: There is no evidence of pericardial effusion. Mitral Valve: The mitral valve has been repaired/replaced. Trivial mitral valve regurgitation. There is a prosthetic annuloplasty ring present in the mitral position. Procedure Date: 08/25/2016. No evidence of mitral valve stenosis. MV peak gradient, 8.6 mmHg. The mean mitral valve gradient  is 4.0 mmHg with average heart rate of 61 bpm. Tricuspid Valve: The tricuspid valve is abnormal. Tricuspid valve regurgitation is moderate. Aortic Valve: The aortic valve is tricuspid. Aortic valve regurgitation is mild to moderate. Aortic regurgitation PHT measures 493 msec. Pulmonic Valve: The pulmonic valve was normal in structure. Pulmonic valve regurgitation is not visualized. Aorta: The aortic root and ascending aorta are structurally normal, with no evidence of dilitation. Venous: The inferior vena cava is normal in size with less than 50% respiratory variability, suggesting right atrial pressure of 8 mmHg. IAS/Shunts: No atrial level shunt detected by color flow Doppler.  Additional Comments: A device lead is visualized.  LEFT VENTRICLE PLAX 2D LVIDd:         3.80 cm         Diastology LVIDs:         2.60 cm         LV e' medial:    4.22 cm/s LV PW:         1.05 cm         LV E/e' medial:  28.4 LV IVS:        1.15 cm         LV e' lateral:   5.76 cm/s LVOT diam:     1.90 cm         LV E/e' lateral: 20.8 LV SV:         63 LV SV Index:   45 LVOT Area:     2.84 cm        3D Volume EF                                LV 3D EF:    Left                                             ventricul                                             ar                                             ejection                                             fraction                                             by 3D                                             volume is                                             50 %.                                 3D Volume EF:                                3D EF:        50 %  LV EDV:       77 ml                                LV ESV:       39 ml                                LV SV:        38 ml RIGHT VENTRICLE RV Basal diam:  3.65 cm RV Mid diam:    2.80 cm RV S prime:     10.10 cm/s TAPSE (M-mode): 3.1 cm LEFT ATRIUM             Index        RIGHT ATRIUM           Index LA  diam:        3.40 cm 2.43 cm/m   RA Area:     16.50 cm LA Vol (A2C):   93.7 ml 67.06 ml/m  RA Volume:   42.05 ml  30.10 ml/m LA Vol (A4C):   52.8 ml 37.79 ml/m LA Biplane Vol: 70.5 ml 50.46 ml/m  AORTIC VALVE LVOT Vmax:   104.00 cm/s LVOT Vmean:  65.000 cm/s LVOT VTI:    0.221 m AI PHT:      493 msec  AORTA Ao Root diam: 2.40 cm Ao Asc diam:  2.90 cm MITRAL VALVE                TRICUSPID VALVE MV Area (PHT): 2.01 cm     TR Peak grad:   26.6 mmHg MV Area VTI:   1.50 cm     TR Vmax:        258.00 cm/s MV Peak grad:  8.6 mmHg MV Mean grad:  4.0 mmHg     SHUNTS MV Vmax:       1.47 m/s     Systemic VTI:  0.22 m MV Vmean:      94.0 cm/s    Systemic Diam: 1.90 cm MV Decel Time: 378 msec MV E velocity: 120.00 cm/s MV A velocity: 94.90 cm/s MV E/A ratio:  1.26 Zoila Shutter MD Electronically signed by Zoila Shutter MD Signature Date/Time: 09/01/2021/11:09:17 PM    Final    CT ANGIO CHEST AORTA W/CM &/OR WO/CM  Result Date: 08/28/2021 CLINICAL DATA:  Pain left upper extremity, evaluate left subclavian artery EXAM: CT ANGIOGRAPHY CHEST WITH CONTRAST TECHNIQUE: Multidetector CT imaging of the chest was performed using the standard protocol during bolus administration of intravenous contrast. Multiplanar CT image reconstructions and MIPs were obtained to evaluate the vascular anatomy. RADIATION DOSE REDUCTION: This exam was performed according to the departmental dose-optimization program which includes automated exposure control, adjustment of the mA and/or kV according to patient size and/or use of iterative reconstruction technique. CONTRAST:  OMNIPAQUE IOHEXOL 350 MG/ML SOLN COMPARISON:  Previous studies including examination done on 02/27/2020 FINDINGS: Cardiovascular: Scattered coronary artery calcifications are seen. Heart is enlarged in size. There is metallic density in the region of mitral annulus, possibly suggesting previous intervention. There are scattered atherosclerotic plaques and calcifications  seen thoracic aorta and upper abdominal aorta. There is severe short-segment stenosis in the proximal course of left subclavian artery with 80-99% narrowing. Rest of the visualized portions of left subclavian artery is patent without other foci of narrowing. There are no intraluminal filling defects in  pulmonary artery branches. Mediastinum/Nodes: No significant lymphadenopathy is seen. Lungs/Pleura: There is no focal pulmonary consolidation. Small linear density seen right lower lung field may suggest scarring or subsegmental atelectasis. There is no pleural effusion or pneumothorax. Upper Abdomen: Severe atherosclerotic changes are noted in the upper abdominal aorta with large atherosclerotic plaques and possible thrombi in the margin of the lumen. There is fatty infiltration in liver. Musculoskeletal: Degenerative changes are noted in lower cervical spine with encroachment of neural foramina. Review of the MIP images confirms the above findings. IMPRESSION: There is short segment high-grade stenosis in the proximal course of left subclavian artery with 80-99% stenosis. There is no evidence of thoracic aortic dissection. There is no evidence of pulmonary artery embolism. Coronary artery calcifications are seen. Severe atherosclerotic changes are noted in the visualized upper abdominal aorta. Fatty liver. Cervical spondylosis. Electronically Signed   By: Ernie Avena M.D.   On: 08/28/2021 11:26   DG Chest Port 1 View  Result Date: 08/28/2021 CLINICAL DATA:  56 year old female with history of chest pain. EXAM: PORTABLE CHEST 1 VIEW COMPARISON:  Chest x-ray 08/14/2021. FINDINGS: Lung volumes are normal. No consolidative airspace disease. No pleural effusions. No pneumothorax. No pulmonary nodule or mass noted. Pulmonary vasculature and the cardiomediastinal silhouette are within normal limits. Status post mitral annuloplasty. Left-sided pacemaker/AICD device in place with lead tips projecting over the  expected location of the right atrium and right ventricle. IMPRESSION: 1. No radiographic evidence of acute cardiopulmonary disease. Electronically Signed   By: Trudie Reed M.D.   On: 08/28/2021 09:40    Disposition   Pt is being discharged home today in good condition.  Follow-up Plans & Appointments     Follow-up Information     Iran Ouch, MD Follow up on 10/18/2021.   Specialty: Cardiology Why: at 11:20am for your follow up appt. Contact information: [address] [place] [address] [phone]         [facility] [address] Follow up on 10/06/2021.   Specialty: Cardiology Why: at 1:15pm for your follow up dopplers Contact information: [address] [id number] mc [place] [address] [phone]               Discharge Instructions     Call MD for:  difficulty breathing, headache or visual disturbances   Complete by: As directed    Call MD for:  persistant dizziness or light-headedness   Complete by: As directed    Call MD for:  redness, tenderness, or signs of infection (pain, swelling, redness, odor or green/yellow discharge around incision site)   Complete by: As directed    Diet - low sodium heart healthy   Complete by: As directed    Discharge instructions   Complete by: As directed    Groin Site Care Refer to this sheet in the next few weeks. These instructions provide you with information on caring for yourself after your procedure. Your caregiver may also give you more specific instructions. Your treatment has been planned according to current medical practices, but problems sometimes occur. Call your caregiver if you have any problems or questions after your procedure. HOME CARE INSTRUCTIONS You may shower 24 hours after the procedure. Remove the bandage (dressing) and gently wash the site with plain soap and water. Gently pat the site dry.  Do not apply powder or lotion  to the site.  Do not sit in a bathtub, swimming pool, or whirlpool for 5 to 7  days.  No bending, squatting, or lifting anything over 10 pounds (4.5 kg) as directed by your caregiver.  Inspect the site at least twice daily.  Do not drive home if you are discharged the same day of the procedure. Have someone else drive you.  You may drive 24 hours after the procedure unless otherwise instructed by your caregiver.  What to expect: Any bruising will usually fade within 1 to 2 weeks.  Blood that collects in the tissue (hematoma) may be painful to the touch. It should usually decrease in size and tenderness within 1 to 2 weeks.  SEEK IMMEDIATE MEDICAL CARE IF: You have unusual pain at the groin site or down the affected leg.  You have redness, warmth, swelling, or pain at the groin site.  You have drainage (other than a small amount of blood on the dressing).  You have chills.  You have a fever or persistent symptoms for more than 72 hours.  You have a fever and your symptoms suddenly get worse.  Your leg becomes pale, cool, tingly, or numb.  You have heavy bleeding from the site. Hold pressure on the site. .   Increase activity slowly   Complete by: As directed        Discharge Medications   Allergies as of 09/22/2021       Reactions   Aldomet [methyldopa] Other (See Comments)   Severe hypotension   Brilinta [ticagrelor] Shortness Of Breath   Other Other (See Comments)   Nuclear Stress Test Medication caused seizures   Coumadin [warfarin Sodium] Swelling   Arm swelling   Desyrel [trazodone] Other (See Comments)   Could not wake up from medication, comatosed   Eliquis [apixaban] Other (See Comments)   BP got too low   Entresto [sacubitril-valsartan] Hypertension   Hypotension    Zestril [lisinopril]    Significant blood pressure fluctuations    Penicillins Rash   Has patient had a PCN reaction causing immediate rash, facial/tongue/throat swelling, SOB or lightheadedness with  hypotension: Yes Has patient had a PCN reaction causing severe rash involving mucus membranes or skin necrosis: No Has patient had a PCN reaction that required hospitalization: No Has patient had a PCN reaction occurring within the last 10 years: No If all of the above answers are "NO", then may proceed with Cephalosporin use.        Medication List     TAKE these medications    aspirin EC 81 MG tablet Take 81 mg by mouth daily.   atorvastatin 80 MG tablet Commonly known as: LIPITOR Take 1 tablet (80 mg total) by mouth daily.   carvedilol 6.25 MG tablet Commonly known as: COREG Take 1 tablet (6.25 mg total) by mouth 2 (two) times daily.   clopidogrel 75 MG tablet Commonly known as: PLAVIX Take 1 tablet (75 mg total) by mouth daily.   ergocalciferol 1.25 MG (50000 UT) capsule Commonly known as: VITAMIN D2 Take 50,000 Units by mouth every Friday.   furosemide 40 MG tablet Commonly known as: LASIX Take 1 tablet (40 mg total) by mouth daily as needed for fluid or edema (shortness of breath).   losartan 25 MG tablet Commonly known as: COZAAR Take 1 tablet (25 mg total) by mouth daily.   Nexletol 180 MG Tabs Generic drug: Bempedoic Acid Take 180 mg by mouth daily.   pantoprazole 40 MG tablet Commonly known as: PROTONIX Take 1 tablet (40 mg total) by mouth daily.   potassium chloride 10 MEQ tablet Commonly  known as: KLOR-CON Take 2 tablets (20 mEq total) by mouth daily.   SALONPAS PAIN RELIEF PATCH EX Apply 1 patch topically daily as needed (pain).   spironolactone 25 MG tablet Commonly known as: ALDACTONE Take 1 tablet (25 mg total) by mouth daily. What changed: how much to take           Outstanding Labs/Studies   Follow up dopplers  Duration of Discharge Encounter   Greater than 30 minutes including physician time.  Signed, Laverda Page, NP 09/22/2021, 11:35 AM   Patient seen and examined. Agree with assessment and plan.  Patient feels well  status post successful mention of the left subclavian artery with angioplasty and balloon expandable stent.  Chest pain.  No weakness in left arm.  Left femoral groin access site stable, and is post Mynx closure.  No chest pain or shortness of breath.  DAPT with aspirin/Plavix.  Patient today stable on spironolactone, torsemide, carvedilol, and losartan.  Aggressive lipid-lowering therapy with target LDL less than 70.  Patient is stable for discharge today with follow-up with Dr. Konrad Dolores, MD, [facility] 09/22/2021 11:35 AM

## 2021-09-26 ENCOUNTER — Telehealth: Payer: Self-pay | Admitting: Cardiovascular Disease

## 2021-09-26 NOTE — Telephone Encounter (Signed)
Decrease carvedilol to 3.125 mg twice daily. 

## 2021-09-26 NOTE — Telephone Encounter (Signed)
Patient is calling back to give BP reading:  100/70; 79

## 2021-09-26 NOTE — Telephone Encounter (Signed)
Pt states that she had a Stent put in on 9/20. Pt says that when she goes to stand up she gets dizzy and feels as if she's going to pass out. She said that her BP is 130/71. Please advise

## 2021-09-26 NOTE — Telephone Encounter (Signed)
Called pt to get more information.  She states I took my blood pressure it was 130/71, I took my medication then about an hour later when I stood up I dizzy and feel like it's I'm going to pass out. Advised pt her blood pressure might have dropped. Pt states "I think that is what happened but I'm not sure." I requested that she take her blood pressure again since she has not since before she took her medication. She states she is out and can not take her blood pressure until 3 when she gets back home. Pt advised to reach back out to [place] when she has is ale to get the blood pressure reading and if she is still having symptoms.  Pt advised to move slowly when going from sitting to standing. She verbalized understanding.

## 2021-09-27 NOTE — Telephone Encounter (Signed)
Called pt to relay Dr. Tyrell Antonio recommendations. No answer, left message for pt.

## 2021-09-28 ENCOUNTER — Other Ambulatory Visit: Payer: Self-pay | Admitting: Cardiology

## 2021-09-28 DIAGNOSIS — R0989 Other specified symptoms and signs involving the circulatory and respiratory systems: Secondary | ICD-10-CM

## 2021-09-28 DIAGNOSIS — I771 Stricture of artery: Secondary | ICD-10-CM

## 2021-10-06 ENCOUNTER — Ambulatory Visit (HOSPITAL_COMMUNITY)
Admission: RE | Admit: 2021-10-06 | Discharge: 2021-10-06 | Disposition: A | Discharge status: Home / Self Care | Payer: Medicare HMO | Source: Ambulatory Visit | Attending: Cardiology | Admitting: Cardiology

## 2021-10-06 DIAGNOSIS — I771 Stricture of artery: Secondary | ICD-10-CM

## 2021-10-06 DIAGNOSIS — R0989 Other specified symptoms and signs involving the circulatory and respiratory systems: Secondary | ICD-10-CM

## 2021-10-11 ENCOUNTER — Other Ambulatory Visit: Payer: Self-pay | Admitting: *Deleted

## 2021-10-11 DIAGNOSIS — I771 Stricture of artery: Secondary | ICD-10-CM

## 2021-10-11 MED ORDER — CARVEDILOL 3.125 MG PO TABS: 3.1250 mg | ORAL_TABLET | Freq: Two times a day (BID) | ORAL | 1 refills | Status: DC

## 2021-10-18 ENCOUNTER — Ambulatory Visit: Payer: Medicare HMO | Admitting: Cardiovascular Disease

## 2021-10-19 NOTE — Telephone Encounter (Signed)
Call and spoke with the patient to confirm that she started this regimen. She states that she has!

## 2021-10-19 NOTE — Progress Notes (Unsigned)
Cardiology Office Note   Date:  10/20/2021   ID:  Lindsay Camacho, DOB [birth year], MRN [id number]  PCP:  Pcp, No    No chief complaint on file.  CAD  Wt Readings from Last 3 Encounters:  10/20/21 101 lb 9.6 oz (46.1 kg)  09/21/21 102 lb (46.3 kg)  09/13/21 102 lb (46.3 kg)       History of Present Illness: Lindsay Camacho is a 56 y.o. female   with a h/o CAD s/p lateral wall MI in 2015 treated with PCI to the CXF. She had a repeat cath in 09/2014 showing patent stent. She unfortunately had a RP bleed post cath and required vascular surgery. She had another R/LHC prior to valve surgery 08/21/16 that showed patent CFX stent with minal restenosis + mild nonobstructive CAD in the RCA and LAD. She also has h/o chronic systolic HF w/ EF of 123456, severe MR and tobacco abuse.    She underwent minimally invasive mitral valve repair, per Dr. Roxy Camacho, for severe symptomatic MR. Surgery was  08/25/16. Post op recovery was fairly uneventfully.        She was admitted in [place], [place] on 01/17/2019 with worsening SOB r/t A/C systolic heart failure. She was diuresed with IV lasix and was transitioned to PO. She was transferred on 1/20 for ICD evaluation after an echo showed worsening EF of 20-25% (35-40% 09/2016). She did not require further diuresis. Successful Medtronic ICD implantation on 1/23 but unsuccessful LV lead due to anatomy. Has HIS and RV lead.   CHF meds have been limited by low BP in the past.    She notes that when she gives her Praluent injection on the right side, her right foot hurts when she walks.  If she tries the left side, the same thing happens with the left foot.   Echo in 2022 showed: "Well-functioning mitral valve annuloplasty ring. compared with the  echo 123456, systolic function ias imprved from 25% to 50-55%.   2. Left ventricular ejection fraction, by estimation, is 50 to 55%. The  left ventricle has low normal function. The left ventricle has no regional  wall  motion abnormalities. There is mild concentric left ventricular  hypertrophy. Left ventricular  diastolic parameters are indeterminate. Elevated left atrial pressure. The  average left ventricular global longitudinal strain is -11.2 %. The global  longitudinal strain is abnormal.   3. Right ventricular systolic function is normal. The right ventricular  size is normal. There is normal pulmonary artery systolic pressure.   4. Left atrial size was mildly dilated.   5. The mitral valve has been repaired/replaced. Mild mitral valve  regurgitation. No evidence of mitral stenosis. The mean mitral valve  gradient is 2.0 mmHg. Procedure Date: 08/25/2016.   6. Tricuspid valve regurgitation is moderate.   7. The aortic valve is normal in structure. Aortic valve regurgitation is  not visualized. No aortic stenosis is present.   8. The inferior vena cava is normal in size with greater than 50%  respiratory variability, suggesting right atrial pressure of 3 mmHg. "  Denies : Chest pain. Dizziness. Leg edema. Nitroglycerin use. Orthopnea. Palpitations. Paroxysmal nocturnal dyspnea. Shortness of breath. Syncope.    She is walking a lot these days.  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 No cardiac sx.   She has to go to [facility] for some type of cervix precancerous issue.   Tolerating atorvastatin.  Nexletol became to expensive.  Successful  left subclavian stent placement in September 2023. Left arm feels well.    Past Medical History:  Diagnosis Date   Acute myocardial infarction of other lateral wall, initial episode of care 01/2013   DES CFX   Aortoiliac occlusive disease ([place]) 08/22/2016   CAD (coronary artery disease) 11/13/2011   50% ? proximal LCx stenosis per Cath at [facility]   Chest pain, atypical, muscular skelatal   01/09/2014   COVID 02/08/2020   Dyspnea    Heart failure (HCC)    HLD (hyperlipidemia)    Hypertension    Intermediate coronary syndrome (HCC)    LBBB  (left bundle branch block)    Mitral regurgitation    Nonischemic cardiomyopathy ([place]) 01/26/2017   Presence of combination internal cardiac defibrillator (ICD) and pacemaker    PVD (peripheral vascular disease) ([place])    Right CFA stenosis per report on 11/14/2011   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/25/2016   Edwards McArthy-Adams IMR ETLogix ring annuloplasty (Model 4100, Serial [id number], size 26) placed via right mini thoracotomy approach   Stenosis of left subclavian artery ([place]) 08/22/2016   Stroke ([place])    tia with no deficits    Past Surgical History:  Procedure Laterality Date   AORTIC ARCH ANGIOGRAPHY N/A 09/21/2021   Procedure: AORTIC ARCH ANGIOGRAPHY;  Surgeon: Lindsay Hampshire, MD;  Location: [place] [facility];  Service: Cardiovascular;  Laterality: N/A;   APPENDECTOMY     BIV ICD INSERTION CRT-D N/A 01/24/2017   Procedure: BIV ICD INSERTION CRT-D;  Surgeon: Lindsay Lance, MD;  Location: [place] [facility];  Service: Cardiovascular;  Laterality: N/A;   CARDIAC CATHETERIZATION  04/14/2013   Non-obstructive disease, patent CFX stent   CARDIAC CATHETERIZATION N/A 09/21/2014   Procedure: Left Heart Cath and Coronary Angiography;  Surgeon: Lindsay Sine, MD;  Location: [place] [facility];  Service: Cardiovascular;  Laterality: N/A;   CORONARY ANGIOPLASTY WITH STENT PLACEMENT  01/17/2013   mild disease except 99% CFX, rx with  2.5 x 28 Alpine drug-eluting stent    GROIN DEBRIDEMENT Right 04/14/2013   Procedure: Emergency Evacuation of Retroperitoneal Hematoma and Repair Right External Iliac Artery Pseudoaneurysm    ;  Surgeon: Lindsay Dutch, MD;  Location: [place];  Service: Vascular;  Laterality: Right;   LEFT HEART CATHETERIZATION WITH CORONARY ANGIOGRAM N/A 01/18/2013   Procedure: LEFT HEART CATHETERIZATION WITH CORONARY ANGIOGRAM;  Surgeon: Lindsay Booze, MD;  Location: [facility] CATH LAB;  Service: Cardiovascular;  Laterality: N/A;   LEFT HEART CATHETERIZATION WITH  CORONARY ANGIOGRAM N/A 04/14/2013   Procedure: LEFT HEART CATHETERIZATION WITH CORONARY ANGIOGRAM;  Surgeon: Lindsay Booze, MD;  Location: [facility] CATH LAB;  Service: Cardiovascular;  Laterality: N/A;   MITRAL VALVE REPAIR Right 08/25/2016   Procedure: MINIMALLY INVASIVE MITRAL VALVE REPAIR;  Surgeon: Rexene Alberts, MD;  Location: [place];  Service: Open Heart Surgery;  Laterality: Right;   MULTIPLE EXTRACTIONS WITH ALVEOLOPLASTY N/A 08/23/2016   Procedure: Extraction of tooth #'s 17, 20-23, 26-29 with alveoloplasty;  Surgeon: Lenn Cal, DDS;  Location: [place];  Service: Oral Surgery;  Laterality: N/A;   PERCUTANEOUS CORONARY STENT INTERVENTION (PCI-S)  01/18/2013   Procedure: PERCUTANEOUS CORONARY STENT INTERVENTION (PCI-S);  Surgeon: Lindsay Booze, MD;  Location: [facility] CATH LAB;  Service: Cardiovascular;;   RIGHT/LEFT HEART CATH AND CORONARY ANGIOGRAPHY N/A 08/21/2016   Procedure: RIGHT/LEFT HEART CATH AND CORONARY ANGIOGRAPHY;  Surgeon: Burnell Blanks, MD;  Location: [place] [facility];  Service:  Cardiovascular;  Laterality: N/A;   TEE WITHOUT CARDIOVERSION N/A 03/06/2014   Procedure: TRANSESOPHAGEAL ECHOCARDIOGRAM (TEE);  Surgeon: Dorothy Spark, MD;  Location: [place];  Service: Cardiovascular;  Laterality: N/A;   TEE WITHOUT CARDIOVERSION N/A 08/18/2016   Procedure: TRANSESOPHAGEAL ECHOCARDIOGRAM (TEE);  Surgeon: Lelon Perla, MD;  Location: [facility] ENDOSCOPY;  Service: Cardiovascular;  Laterality: N/A;   TEE WITHOUT CARDIOVERSION N/A 08/25/2016   Procedure: TRANSESOPHAGEAL ECHOCARDIOGRAM (TEE);  Surgeon: Rexene Alberts, MD;  Location: [place];  Service: Open Heart Surgery;  Laterality: N/A;   TUBAL LIGATION     UPPER EXTREMITY INTERVENTION  09/21/2021   Procedure: UPPER EXTREMITY INTERVENTION;  Surgeon: Lindsay Hampshire, MD;  Location: [place] [facility];  Service: Cardiovascular;;     Current Outpatient Medications  Medication Sig Dispense Refill   aspirin EC 81  MG tablet Take 81 mg by mouth daily.     atorvastatin (LIPITOR) 80 MG tablet Take 1 tablet (80 mg total) by mouth daily. 90 tablet 3   carvedilol (COREG) 6.25 MG tablet Take 1 tablet (6.25 mg total) by mouth 2 (two) times daily. 180 tablet 3   clopidogrel (PLAVIX) 75 MG tablet Take 1 tablet (75 mg total) by mouth daily. 90 tablet 3   ergocalciferol (VITAMIN D2) 1.25 MG (50000 UT) capsule Take 50,000 Units by mouth every Friday.     furosemide (LASIX) 40 MG tablet Take 1 tablet (40 mg total) by mouth daily as needed for fluid or edema (shortness of breath). 90 tablet 3   losartan (COZAAR) 25 MG tablet Take 1 tablet (25 mg total) by mouth daily. 30 tablet 0   Menthol-Methyl Salicylate (SALONPAS PAIN RELIEF PATCH EX) Apply 1 patch topically daily as needed (pain).     pantoprazole (PROTONIX) 40 MG tablet Take 1 tablet (40 mg total) by mouth daily. 90 tablet 3   potassium chloride (KLOR-CON) 10 MEQ tablet Take 2 tablets (20 mEq total) by mouth daily. 180 tablet 3   spironolactone (ALDACTONE) 25 MG tablet Take 1 tablet (25 mg total) by mouth daily. (Patient taking differently: Take 12.5 mg by mouth daily.) 90 tablet 3   No current facility-administered medications for this visit.    Allergies:   Aldomet [methyldopa], Brilinta [ticagrelor], Other, Coumadin [warfarin sodium], Desyrel [trazodone], Eliquis [apixaban], Entresto [sacubitril-valsartan], Zestril [lisinopril], and Penicillins    Social History:  The patient  reports that she has been smoking cigarettes. She has been smoking an average of .5 packs per day. She has never used smokeless tobacco. She reports that she does not currently use drugs after having used the following drugs: Marijuana. She reports that she does not drink alcohol.   Family History:  The patient's family history includes Bladder Cancer in her mother; CAD (age of onset: 11) in her mother; CAD (age of onset: 54) in her father; Heart attack in her father and mother; Heart  disease in her father and mother; Hypertension in her mother; Lung cancer in her mother; Stroke in her father and mother.    ROS:  Please see the history of present illness.   Otherwise, review of systems are positive for blood pressure is increased since leaving the hospital.   All other systems are reviewed and negative.    PHYSICAL EXAM: VS:  BP (!) 146/78   Pulse 76   Ht 5\' 1"  (1.549 m)   Wt 101 lb 9.6 oz (46.1 kg)   SpO2 98%   BMI 19.20 kg/m  , BMI Body mass  index is 19.2 kg/m. GEN: Well nourished, well developed, in no acute distress HEENT: normal Neck: no JVD, carotid bruits, or masses Cardiac: RRR; no murmurs, rubs, or gallops,no edema  Respiratory:  clear to auscultation bilaterally, normal work of breathing GI: soft, nontender, nondistended, + BS MS: no deformity or atrophy Skin: warm and dry, no rash Neuro:  Strength and sensation are intact Psych: euthymic mood, full affect   EKG:   The ekg ordered today demonstrates atrial sensed ventricular paced rhythm   Recent Labs: 08/14/2021: B Natriuretic Peptide 462.0 08/28/2021: ALT 15 08/29/2021: BUN 14; Creatinine, Ser 1.04; Hemoglobin 14.5; Platelets 163; Potassium 3.8; Sodium 139   Lipid Panel    Component Value Date/Time   CHOL 173 09/22/2021 0431   CHOL 132 03/07/2019 0923   TRIG 97 09/22/2021 0431   HDL 28 (L) 09/22/2021 0431   HDL 40 03/07/2019 0923   CHOLHDL 6.2 09/22/2021 0431   VLDL 19 09/22/2021 0431   LDLCALC 126 (H) 09/22/2021 0431   LDLCALC 79 03/07/2019 0923     Other studies Reviewed: Additional studies/ records that were reviewed today with results demonstrating: LDL 126..   ASSESSMENT AND PLAN:  CAD: No angina.  Continue aspirin and Plavix.  Asymptomatic from a cardiac standpoint.  Unclear exactly what procedure she is having at [facility].  Given that she is walking regularly without cardiac symptoms, no further cardiac testing needed at this time. S/p MV repair: SBE prophylaxis.  No  CHF. Chronic systolic heart failure: Appears euvolemic.  Hyperlipidemia: LDL 126 in 9/23.   We will check with Pharm.D. lipid clinic about other options for lipid-lowering therapy.  For now continue atorvastatin.  Plant-based diet, high-fiber diet, avoid processed foods. Tobacco abuse: Still smoking.  Prescribed nicotine patch 14 mg/day. HTN: Blood pressure has increased since the hospital stay.  We will increase the carvedilol to 6.25 mg twice a day.   Current medicines are reviewed at length with the patient today.  The patient concerns regarding her medicines were addressed.  The following changes have been made:  No change  Labs/ tests ordered today include:  No orders of the defined types were placed in this encounter.   Recommend 150 minutes/week of aerobic exercise Low fat, low carb, high fiber diet recommended  Disposition:   FU in 6 months   Signed, Larae Grooms, MD  10/20/2021 5:20 PM    [facility] [address], [place], [place]  [address] Phone: [phone]; Fax: [phone]

## 2021-10-20 ENCOUNTER — Ambulatory Visit: Payer: Medicare HMO | Attending: Interventional Cardiology | Admitting: Interventional Cardiology

## 2021-10-20 ENCOUNTER — Encounter: Payer: Self-pay | Admitting: Interventional Cardiology

## 2021-10-20 VITALS — BP 146/78 | HR 76 | Ht 61.0 in | Wt 101.6 lb

## 2021-10-20 DIAGNOSIS — I771 Stricture of artery: Secondary | ICD-10-CM

## 2021-10-20 DIAGNOSIS — I25118 Atherosclerotic heart disease of native coronary artery with other forms of angina pectoris: Secondary | ICD-10-CM

## 2021-10-20 DIAGNOSIS — E785 Hyperlipidemia, unspecified: Secondary | ICD-10-CM

## 2021-10-20 DIAGNOSIS — I5022 Chronic systolic (congestive) heart failure: Secondary | ICD-10-CM

## 2021-10-20 DIAGNOSIS — Z95810 Presence of automatic (implantable) cardiac defibrillator: Secondary | ICD-10-CM

## 2021-10-20 MED ORDER — NICOTINE 14 MG/24HR TD PT24: 14.0000 mg | MEDICATED_PATCH | Freq: Every day | TRANSDERMAL | 3 refills | Status: DC

## 2021-10-20 MED ORDER — CARVEDILOL 6.25 MG PO TABS: 6.2500 mg | ORAL_TABLET | Freq: Two times a day (BID) | ORAL | 3 refills | Status: DC

## 2021-10-20 NOTE — Patient Instructions (Signed)
Medication Instructions:  Your physician has recommended you make the following change in your medication:  Increase Carvedilol to 6.25 mg by mouth twice daily   *If you need a refill on your cardiac medications before your next appointment, please call your pharmacy*   Lab Work: none If you have labs (blood work) drawn today and your tests are completely normal, you will receive your results only by: [place] (if you have MyChart) OR A paper copy in the mail If you have any lab test that is abnormal or we need to change your treatment, we will call you to review the results.   Testing/Procedures: none   Follow-Up: At [facility], you and your health needs are our priority.  As part of our continuing mission to provide you with exceptional heart care, we have created designated Provider Care Teams.  These Care Teams include your primary Cardiologist (physician) and Advanced Practice Providers (APPs -  Physician Assistants and Nurse Practitioners) who all work together to provide you with the care you need, when you need it.  We recommend signing up for the patient portal called "MyChart".  Sign up information is provided on this After Visit Summary.  MyChart is used to connect with patients for Virtual Visits (Telemedicine).  Patients are able to view lab/test results, encounter notes, upcoming appointments, etc.  Non-urgent messages can be sent to your provider as well.   To learn more about what you can do with MyChart, go to [email or web address].    Your next appointment:   6 month(s)  The format for your next appointment:   In Person  Provider:   Larae Grooms, MD     Other Instructions    Important Information About Sugar

## 2021-11-04 ENCOUNTER — Telehealth: Payer: Self-pay | Admitting: Pharmacist

## 2021-11-04 MED ORDER — NEXLETOL 180 MG PO TABS: 1.0000 | ORAL_TABLET | Freq: Every day | ORAL | 11 refills | Status: DC

## 2021-11-04 NOTE — Telephone Encounter (Signed)
Received message from Dr. Irish Lack that patient could not afford her Nexletol. Spoke with patient who states that her copay was $12/month She has medicare and medicaid. Shouldn't have a copay Also states she hasn't gotten her nicotine patches yet PA for Nexletol submitted Key: [id number] Will call pharmacy to follow up on price or it and nicotine patches once I hear back from insurance  PA for Nexletol approved through 01/02/23  Spoke with patient- advised I call pharmacy and cost is $0. Patches sent to [facility]. I will send to [place] per pt request. I sent both 14mg  and 7mg .

## 2021-11-07 MED ORDER — NICOTINE 7 MG/24HR TD PT24: 7.0000 mg | MEDICATED_PATCH | Freq: Every day | TRANSDERMAL | 0 refills | Status: DC

## 2021-11-07 MED ORDER — NICOTINE 14 MG/24HR TD PT24: MEDICATED_PATCH | TRANSDERMAL | 0 refills | Status: DC

## 2021-11-10 ENCOUNTER — Telehealth: Payer: Self-pay

## 2021-11-10 NOTE — Telephone Encounter (Signed)
**Note De-Identified Desmund Elman Obfuscation** Tierany Novicki Key: [id number]  Outcome: Denied today The Medicare rule in the Prescription Drug Manual (Chapter 6, Section 10.10) says over-the-counter (OTC) drugs are excluded from Medicare Part D coverage. Your drug is an over-the-counter drug. Per Medicare rules, it is not covered. Drug Nicotine 14MG /24HR 24 hr patches Form [facility] Electronic PA Form Original Claim Info 70,A5 [facility] Adolm Joseph  I have notified [address] - [place], [place] - [address] (Ph: [phone]) of this denial.

## 2021-11-14 ENCOUNTER — Ambulatory Visit (INDEPENDENT_AMBULATORY_CARE_PROVIDER_SITE_OTHER): Payer: Medicaid - Out of State

## 2021-11-14 DIAGNOSIS — I5022 Chronic systolic (congestive) heart failure: Secondary | ICD-10-CM | POA: Diagnosis not present

## 2021-11-15 LAB — CUP PACEART REMOTE DEVICE CHECK
Battery Remaining Longevity: 22 mo
Battery Voltage: 2.87 V
Brady Statistic AP VP Percent: 0.07 %
Brady Statistic AP VS Percent: 0.01 %
Brady Statistic AS VP Percent: 99.78 %
Brady Statistic AS VS Percent: 0.14 %
Brady Statistic RA Percent Paced: 0.08 %
Brady Statistic RV Percent Paced: 99.7 %
Date Time Interrogation Session: 20231113033323
HighPow Impedance: 56 Ohm
Implantable Lead Connection Status: 753985
Implantable Lead Connection Status: 753985
Implantable Lead Connection Status: 753985
Implantable Lead Implant Date: 20190124
Implantable Lead Implant Date: 20190124
Implantable Lead Implant Date: 20190124
Implantable Lead Location: 753859
Implantable Lead Location: 753860
Implantable Lead Location: 753860
Implantable Lead Model: 3830
Implantable Lead Model: 5076
Implantable Lead Model: 6935
Implantable Pulse Generator Implant Date: 20190124
Lead Channel Impedance Value: 190 Ohm
Lead Channel Impedance Value: 266 Ohm
Lead Channel Impedance Value: 380 Ohm
Lead Channel Impedance Value: 399 Ohm
Lead Channel Impedance Value: 437 Ohm
Lead Channel Impedance Value: 494 Ohm
Lead Channel Pacing Threshold Amplitude: 0.625 V
Lead Channel Pacing Threshold Amplitude: 0.75 V
Lead Channel Pacing Threshold Amplitude: 1 V
Lead Channel Pacing Threshold Pulse Width: 0.4 ms
Lead Channel Pacing Threshold Pulse Width: 0.4 ms
Lead Channel Pacing Threshold Pulse Width: 0.5 ms
Lead Channel Sensing Intrinsic Amplitude: 1.5 mV
Lead Channel Sensing Intrinsic Amplitude: 1.5 mV
Lead Channel Sensing Intrinsic Amplitude: 8.125 mV
Lead Channel Sensing Intrinsic Amplitude: 8.125 mV
Lead Channel Setting Pacing Amplitude: 1.25 V
Lead Channel Setting Pacing Amplitude: 1.5 V
Lead Channel Setting Pacing Amplitude: 2.5 V
Lead Channel Setting Pacing Pulse Width: 0.4 ms
Lead Channel Setting Pacing Pulse Width: 0.5 ms
Lead Channel Setting Sensing Sensitivity: 0.3 mV
Zone Setting Status: 755011

## 2021-11-21 ENCOUNTER — Telehealth: Payer: Self-pay

## 2021-11-21 NOTE — Telephone Encounter (Signed)
UPDATED PRE OP NOTES;   PROCEDURE: VULVA, VULVECTOMY-PARTIAL   SURGEON: DR. Noel Gerold  ANESTHESIA: GENERAL

## 2021-11-21 NOTE — Telephone Encounter (Signed)
Preoperative team, please contact requesting office and let them know that we are unable to provide cardiac clearance for unknown procedure.  We are not able to  provide blanket coverage.  Once details of surgery are clarified we will be able to provide cardiac recommendations.  Thank you  Thomasene Ripple. Dearra Myhand NP-C     11/21/2021, 12:44 PM [facility] [address] Office [phone] Fax [phone]

## 2021-11-21 NOTE — Telephone Encounter (Signed)
     Primary Cardiologist: Lance Muss, MD  Chart reviewed as part of pre-operative protocol coverage. Given past medical history and time since last visit, based on ACC/AHA guidelines, Lindsay Camacho would be at acceptable risk for the planned procedure without further cardiovascular testing.   Her Plavix may be held for 5 days prior to her procedure.  Her aspirin should be continued throughout her procedure however if deemed necessary it may be held for 5-7 days prior to her surgery.  Please resume as soon as hemostasis is achieved  I will route this recommendation to the requesting party via Epic fax function and remove from pre-op pool.  Please call with questions.  Thomasene Ripple. [name] NP-C     11/21/2021, 4:23 PM [facility] [address] Office [phone] Fax [phone]

## 2021-11-21 NOTE — Telephone Encounter (Signed)
...     Pre-operative Risk Assessment    Patient Name: Lindsay Camacho  DOB: 09/08/1965 MRN: [id number]      Request for Surgical Clearance    Procedure:   unk  Date of Surgery:  Clearance 12/20/20                                 Surgeon:  Loreli Slot Surgeon's Group or Practice Name:pre  -anesthesia evaluation and testing center Phone number:  [phone] Fax number:  [phone]   Type of Clearance Requested:   - Medical  - Pharmacy:  Hold Aspirin and Clopidogrel (Plavix) would like to hold plavix for 7 days and contiune asa   Type of Anesthesia:  Not Indicated   Additional requests/questions:    Jola Babinski   11/21/2021, 12:07 PM

## 2021-12-01 ENCOUNTER — Telehealth: Payer: Self-pay | Admitting: Interventional Cardiology

## 2021-12-01 NOTE — Telephone Encounter (Signed)
Only Nexletol is free. Looks like [place] submitted a prior authorization for nicotine patches on 11/9 phone note and insurance denied the request stating they don't cover OTC meds. She'll need to purchase the patches over the counter.

## 2021-12-01 NOTE — Telephone Encounter (Signed)
  Pt c/o medication issue:  1. Name of Medication: nicotine (NICODERM CQ - DOSED IN MG/24 HR) 7 mg/24hr patch   2. How are you currently taking this medication (dosage and times per day)?   Place 1 patch (7 mg total) onto the skin daily.    3. Are you having a reaction (difficulty breathing--STAT)? No   4. What is your medication issue? Pt said, her pharmacy wont approve this medication and need Dr. Abe People to approve it

## 2021-12-01 NOTE — Telephone Encounter (Signed)
Patient notified

## 2021-12-27 NOTE — Progress Notes (Signed)
Remote ICD transmission.   

## 2022-02-07 ENCOUNTER — Ambulatory Visit: Payer: Medicare HMO | Attending: Cardiovascular Disease | Admitting: Cardiovascular Disease

## 2022-02-07 ENCOUNTER — Encounter: Payer: Self-pay | Admitting: Cardiovascular Disease

## 2022-02-07 VITALS — BP 150/90 | HR 76 | Ht 61.0 in | Wt 103.0 lb

## 2022-02-07 DIAGNOSIS — Z720 Tobacco use: Secondary | ICD-10-CM

## 2022-02-07 DIAGNOSIS — I5022 Chronic systolic (congestive) heart failure: Secondary | ICD-10-CM

## 2022-02-07 DIAGNOSIS — E785 Hyperlipidemia, unspecified: Secondary | ICD-10-CM

## 2022-02-07 DIAGNOSIS — I25118 Atherosclerotic heart disease of native coronary artery with other forms of angina pectoris: Secondary | ICD-10-CM | POA: Diagnosis not present

## 2022-02-07 DIAGNOSIS — I771 Stricture of artery: Secondary | ICD-10-CM | POA: Diagnosis not present

## 2022-02-07 DIAGNOSIS — K219 Gastro-esophageal reflux disease without esophagitis: Secondary | ICD-10-CM | POA: Diagnosis not present

## 2022-02-07 DIAGNOSIS — Z9889 Other specified postprocedural states: Secondary | ICD-10-CM

## 2022-02-07 MED ORDER — LOSARTAN POTASSIUM 25 MG PO TABS: 25.0000 mg | ORAL_TABLET | Freq: Every day | ORAL | 3 refills | Status: DC

## 2022-02-07 MED ORDER — ERGOCALCIFEROL 1.25 MG (50000 UT) PO CAPS: 50000.0000 [IU] | ORAL_CAPSULE | ORAL | 6 refills | Status: DC

## 2022-02-07 MED ORDER — PANTOPRAZOLE SODIUM 40 MG PO TBEC: 40.0000 mg | DELAYED_RELEASE_TABLET | Freq: Every day | ORAL | 3 refills | Status: DC

## 2022-02-07 MED ORDER — SPIRONOLACTONE 25 MG PO TABS: 12.5000 mg | ORAL_TABLET | Freq: Every day | ORAL | 3 refills | Status: DC

## 2022-02-07 NOTE — Progress Notes (Signed)
Cardiology Office Note   Date:  02/07/2022   ID:  Lindsay Camacho, DOB 1965-01-16, MRN [id number]  PCP:  Pcp, No  Cardiologist:  Dr. Eldridge Dace  Chief Complaint  Patient presents with   Follow-up      History of Present Illness: Lindsay Camacho is a 57 y.o. female who is here today for follow-up visit regarding left subclavian artery stenosis status post stenting in September 2023. She has known history of coronary artery disease status post lateral MI in 2015 treated with PCI to the left circumflex.  Cardiac catheterization was complicated by retroperitoneal bleed requiring vascular surgery.  She has known history of chronic systolic heart failure and severe mitral regurgitation status post mitral valve repair in 2018.  There is prolonged history of tobacco use.  She is status post an ICD placement. 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  She was initially treated medically but her symptoms worsened with significant left arm claudication associated with dizziness and balance issues. I proceeded with arch and left arm angiography in September 2023 which showed occluded proximal left subclavian artery with retrograde flow in the left vertebral artery.  I performed successful angioplasty and balloon expandable stent placement to the left subclavian artery.  Carotid Doppler in October showed patent left subclavian artery stent.  She reports resolution of left arm claudication.  She has been doing reasonably well overall but her blood pressure has been elevated.  No chest pain.   Past Medical History:  Diagnosis Date   Acute myocardial infarction of other lateral wall, initial episode of care 01/2013   DES CFX   Aortoiliac occlusive disease (HCC) 08/22/2016   CAD (coronary artery disease) 11/13/2011   50% ? proximal LCx stenosis per Cath at [facility]   Chest pain, atypical, muscular skelatal   01/09/2014   COVID 02/08/2020   Dyspnea    Heart failure (HCC)     HLD (hyperlipidemia)    Hypertension    Intermediate coronary syndrome (HCC)    LBBB (left bundle branch block)    Mitral regurgitation    Nonischemic cardiomyopathy (HCC) 01/26/2017   Presence of combination internal cardiac defibrillator (ICD) and pacemaker    PVD (peripheral vascular disease) (HCC)    Right CFA stenosis per report on 11/14/2011   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/25/2016   Edwards McArthy-Adams IMR ETLogix ring annuloplasty (Model 4100, Serial [id number], size 26) placed via right mini thoracotomy approach   Stenosis of left subclavian artery (HCC) 08/22/2016   Stroke (HCC)    tia with no deficits    Past Surgical History:  Procedure Laterality Date   AORTIC ARCH ANGIOGRAPHY N/A 09/21/2021   Procedure: AORTIC ARCH ANGIOGRAPHY;  Surgeon: Iran Ouch, MD;  Location: [facility] INVASIVE CV LAB;  Service: Cardiovascular;  Laterality: N/A;   APPENDECTOMY     BIV ICD INSERTION CRT-D N/A 01/24/2017   Procedure: BIV ICD INSERTION CRT-D;  Surgeon: Marinus Maw, MD;  Location: [facility] INVASIVE CV LAB;  Service: Cardiovascular;  Laterality: N/A;   CARDIAC CATHETERIZATION  04/14/2013   Non-obstructive disease, patent CFX stent   CARDIAC CATHETERIZATION N/A 09/21/2014   Procedure: Left Heart Cath and Coronary Angiography;  Surgeon: Lennette Bihari, MD;  Location: [facility] INVASIVE CV LAB;  Service: Cardiovascular;  Laterality: N/A;   CORONARY ANGIOPLASTY WITH STENT PLACEMENT  01/17/2013   mild disease except 99% CFX, rx with  2.5 x 28 Alpine drug-eluting stent  GROIN DEBRIDEMENT Right 04/14/2013   Procedure: Emergency Evacuation of Retroperitoneal Hematoma and Repair Right External Iliac Artery Pseudoaneurysm    ;  Surgeon: Elam Dutch, MD;  Location: [facility] OR;  Service: Vascular;  Laterality: Right;   LEFT HEART CATHETERIZATION WITH CORONARY ANGIOGRAM N/A 01/18/2013   Procedure: LEFT HEART CATHETERIZATION WITH CORONARY ANGIOGRAM;  Surgeon: Jettie Booze, MD;  Location:  [facility] CATH LAB;  Service: Cardiovascular;  Laterality: N/A;   LEFT HEART CATHETERIZATION WITH CORONARY ANGIOGRAM N/A 04/14/2013   Procedure: LEFT HEART CATHETERIZATION WITH CORONARY ANGIOGRAM;  Surgeon: Jettie Booze, MD;  Location: [facility] CATH LAB;  Service: Cardiovascular;  Laterality: N/A;   MITRAL VALVE REPAIR Right 08/25/2016   Procedure: MINIMALLY INVASIVE MITRAL VALVE REPAIR;  Surgeon: Rexene Alberts, MD;  Location: [place];  Service: Open Heart Surgery;  Laterality: Right;   MULTIPLE EXTRACTIONS WITH ALVEOLOPLASTY N/A 08/23/2016   Procedure: Extraction of tooth #'s 17, 20-23, 26-29 with alveoloplasty;  Surgeon: Lenn Cal, DDS;  Location: [place];  Service: Oral Surgery;  Laterality: N/A;   PERCUTANEOUS CORONARY STENT INTERVENTION (PCI-S)  01/18/2013   Procedure: PERCUTANEOUS CORONARY STENT INTERVENTION (PCI-S);  Surgeon: Jettie Booze, MD;  Location: [facility] CATH LAB;  Service: Cardiovascular;;   RIGHT/LEFT HEART CATH AND CORONARY ANGIOGRAPHY N/A 08/21/2016   Procedure: RIGHT/LEFT HEART CATH AND CORONARY ANGIOGRAPHY;  Surgeon: Burnell Blanks, MD;  Location: [place] CV LAB;  Service: Cardiovascular;  Laterality: N/A;   TEE WITHOUT CARDIOVERSION N/A 03/06/2014   Procedure: TRANSESOPHAGEAL ECHOCARDIOGRAM (TEE);  Surgeon: Dorothy Spark, MD;  Location: [place];  Service: Cardiovascular;  Laterality: N/A;   TEE WITHOUT CARDIOVERSION N/A 08/18/2016   Procedure: TRANSESOPHAGEAL ECHOCARDIOGRAM (TEE);  Surgeon: Lelon Perla, MD;  Location: [facility] ENDOSCOPY;  Service: Cardiovascular;  Laterality: N/A;   TEE WITHOUT CARDIOVERSION N/A 08/25/2016   Procedure: TRANSESOPHAGEAL ECHOCARDIOGRAM (TEE);  Surgeon: Rexene Alberts, MD;  Location: [place];  Service: Open Heart Surgery;  Laterality: N/A;   TUBAL LIGATION     UPPER EXTREMITY INTERVENTION  09/21/2021   Procedure: UPPER EXTREMITY INTERVENTION;  Surgeon: Wellington Hampshire, MD;  Location: [place] CV LAB;  Service:  Cardiovascular;;     Current Outpatient Medications  Medication Sig Dispense Refill   aspirin EC 81 MG tablet Take 81 mg by mouth daily.     atorvastatin (LIPITOR) 80 MG tablet Take 1 tablet (80 mg total) by mouth daily. 90 tablet 3   Bempedoic Acid (NEXLETOL) 180 MG TABS Take 1 tablet by mouth daily. 30 tablet 11   carvedilol (COREG) 6.25 MG tablet Take 1 tablet (6.25 mg total) by mouth 2 (two) times daily. 180 tablet 3   clopidogrel (PLAVIX) 75 MG tablet Take 1 tablet (75 mg total) by mouth daily. 90 tablet 3   ergocalciferol (VITAMIN D2) 1.25 MG (50000 UT) capsule Take 50,000 Units by mouth every Friday.     furosemide (LASIX) 40 MG tablet Take 1 tablet (40 mg total) by mouth daily as needed for fluid or edema (shortness of breath). 90 tablet 3   losartan (COZAAR) 25 MG tablet Take 1 tablet (25 mg total) by mouth daily. 30 tablet 0   Menthol-Methyl Salicylate (SALONPAS PAIN RELIEF PATCH EX) Apply 1 patch topically daily as needed (pain).     nicotine (NICODERM CQ - DOSED IN MG/24 HOURS) 14 mg/24hr patch Apply daily for 6 weeks, then decrease to 7mg  daily for 2 weeks 42 patch 0   nicotine (NICODERM CQ - DOSED IN  MG/24 HR) 7 mg/24hr patch Place 1 patch (7 mg total) onto the skin daily. 14 patch 0   pantoprazole (PROTONIX) 40 MG tablet Take 1 tablet (40 mg total) by mouth daily. 90 tablet 3   potassium chloride (KLOR-CON) 10 MEQ tablet Take 2 tablets (20 mEq total) by mouth daily. 180 tablet 3   spironolactone (ALDACTONE) 25 MG tablet Take 1 tablet (25 mg total) by mouth daily. (Patient taking differently: Take 12.5 mg by mouth daily.) 90 tablet 3   No current facility-administered medications for this visit.    Allergies:   Aldomet [methyldopa], Brilinta [ticagrelor], Other, Coumadin [warfarin sodium], Desyrel [trazodone], Eliquis [apixaban], Entresto [sacubitril-valsartan], Zestril [lisinopril], and Penicillins    Social History:  The patient  reports that she has been smoking  cigarettes. She has been smoking an average of .5 packs per day. She has never used smokeless tobacco. She reports that she does not currently use drugs after having used the following drugs: Marijuana. She reports that she does not drink alcohol.   Family History:  The patient's family history includes Bladder Cancer in her mother; CAD (age of onset: 36) in her mother; CAD (age of onset: 14) in her father; Heart attack in her father and mother; Heart disease in her father and mother; Hypertension in her mother; Lung cancer in her mother; Stroke in her father and mother.    ROS:  Please see the history of present illness.   Otherwise, review of systems are positive for none.   All other systems are reviewed and negative.    PHYSICAL EXAM: VS:  BP (!) 150/90 (BP Location: Right Arm, Patient Position: Sitting, Cuff Size: Normal)   Pulse 76   Ht 5\' 1"  (1.549 m)   Wt 103 lb (46.7 kg)   BMI 19.46 kg/m  , BMI Body mass index is 19.46 kg/m. GEN: Well nourished, well developed, in no acute distress  HEENT: normal  Neck: no JVD,  or masses.  Right carotid bruit.  There is a bruit at the left subclavian artery area. Cardiac: RRR; no rubs, or gallops,no edema .  1 out of 6 systolic murmur at the base. Respiratory:  clear to auscultation bilaterally, normal work of breathing GI: soft, nontender, nondistended, + BS MS: no deformity or atrophy  Skin: warm and dry, no rash Neuro:  Strength and sensation are intact Psych: euthymic mood, full affect Vascular: Radial pulses +2 on both sides Femoral pulses normal bilaterally.  Distal pulses are normal.     EKG:  EKG is ordered today. EKG showed atrial sensed ventricular paced rhythm   Recent Labs: 08/14/2021: B Natriuretic Peptide 462.0 08/28/2021: ALT 15 08/29/2021: BUN 14; Creatinine, Ser 1.04; Hemoglobin 14.5; Platelets 163; Potassium 3.8; Sodium 139    Lipid Panel    Component Value Date/Time   CHOL 173 09/22/2021 0431   CHOL 132  03/07/2019 0923   TRIG 97 09/22/2021 0431   HDL 28 (L) 09/22/2021 0431   HDL 40 03/07/2019 0923   CHOLHDL 6.2 09/22/2021 0431   VLDL 19 09/22/2021 0431   LDLCALC 126 (H) 09/22/2021 0431   LDLCALC 79 03/07/2019 0923      Wt Readings from Last 3 Encounters:  02/07/22 103 lb (46.7 kg)  10/20/21 101 lb 9.6 oz (46.1 kg)  09/21/21 102 lb (46.3 kg)           No data to display            ASSESSMENT AND PLAN:  1.  Left subclavian artery occlusion status post angioplasty and stent placement in September 2023: Resolution of left arm claudication and neurologic symptoms.  Repeat carotid Doppler in April.  2.  Coronary artery disease involving native coronary arteries with other forms of angina angina: She seems to be stable overall.  3.  Chronic systolic heart failure: She appears to be euvolemic.  I increased losartan to 25 mg once daily given elevated blood pressure.  4.  Status post mitral valve repair: Stable.  5.  Tobacco use: I discussed with her the importance of smoking cessation and provided her with instructions.  6.  Hyperlipidemia: She is on high-dose atorvastatin.  Recommended target LDL of less than 70.    Disposition:   follow up in 6 months  Signed,  Kathlyn Sacramento, MD  02/07/2022 11:40 AM    [place]

## 2022-02-07 NOTE — Patient Instructions (Signed)
Medication Instructions:  LOSARTAN: take 25 mg once daily  *If you need a refill on your cardiac medications before your next appointment, please call your pharmacy*   Lab Work: None ordered If you have labs (blood work) drawn today and your tests are completely normal, you will receive your results only by: [place] (if you have MyChart) OR A paper copy in the mail If you have any lab test that is abnormal or we need to change your treatment, we will call you to review the results.   Testing/Procedures: Your physician has requested that you have a carotid duplex in [place] . This test is an ultrasound of the carotid arteries in your neck. It looks at blood flow through these arteries that supply the brain with blood. Allow one hour for this exam. There are no restrictions or special instructions. This will take place at [place], [address].    Follow-Up: At [facility], you and your health needs are our priority.  As part of our continuing mission to provide you with exceptional heart care, we have created designated Provider Care Teams.  These Care Teams include your primary Cardiologist (physician) and Advanced Practice Providers (APPs -  Physician Assistants and Nurse Practitioners) who all work together to provide you with the care you need, when you need it.  We recommend signing up for the patient portal called "MyChart".  Sign up information is provided on this After Visit Summary.  MyChart is used to connect with patients for Virtual Visits (Telemedicine).  Patients are able to view lab/test results, encounter notes, upcoming appointments, etc.  Non-urgent messages can be sent to your provider as well.   To learn more about what you can do with MyChart, go to [email or web address].    Your next appointment:   6 month(s)  Provider:   Dr. Fletcher Anon

## 2022-02-13 ENCOUNTER — Ambulatory Visit (INDEPENDENT_AMBULATORY_CARE_PROVIDER_SITE_OTHER): Payer: Medicare HMO

## 2022-02-13 DIAGNOSIS — I5022 Chronic systolic (congestive) heart failure: Secondary | ICD-10-CM | POA: Diagnosis not present

## 2022-02-13 DIAGNOSIS — I428 Other cardiomyopathies: Secondary | ICD-10-CM

## 2022-02-14 LAB — CUP PACEART REMOTE DEVICE CHECK
Battery Remaining Longevity: 23 mo
Battery Voltage: 2.86 V
Brady Statistic AP VP Percent: 0.04 %
Brady Statistic AP VS Percent: 0.01 %
Brady Statistic AS VP Percent: 99.89 %
Brady Statistic AS VS Percent: 0.06 %
Brady Statistic RA Percent Paced: 0.05 %
Brady Statistic RV Percent Paced: 99.77 %
Date Time Interrogation Session: 20240212044223
HighPow Impedance: 76 Ohm
Implantable Lead Connection Status: 753985
Implantable Lead Connection Status: 753985
Implantable Lead Connection Status: 753985
Implantable Lead Implant Date: 20190124
Implantable Lead Implant Date: 20190124
Implantable Lead Implant Date: 20190124
Implantable Lead Location: 753859
Implantable Lead Location: 753860
Implantable Lead Location: 753860
Implantable Lead Model: 3830
Implantable Lead Model: 5076
Implantable Lead Model: 6935
Implantable Pulse Generator Implant Date: 20190124
Lead Channel Impedance Value: 209 Ohm
Lead Channel Impedance Value: 304 Ohm
Lead Channel Impedance Value: 380 Ohm
Lead Channel Impedance Value: 456 Ohm
Lead Channel Impedance Value: 456 Ohm
Lead Channel Impedance Value: 513 Ohm
Lead Channel Pacing Threshold Amplitude: 0.625 V
Lead Channel Pacing Threshold Amplitude: 0.75 V
Lead Channel Pacing Threshold Amplitude: 1.375 V
Lead Channel Pacing Threshold Pulse Width: 0.4 ms
Lead Channel Pacing Threshold Pulse Width: 0.4 ms
Lead Channel Pacing Threshold Pulse Width: 0.5 ms
Lead Channel Sensing Intrinsic Amplitude: 1.375 mV
Lead Channel Sensing Intrinsic Amplitude: 1.375 mV
Lead Channel Sensing Intrinsic Amplitude: 8.875 mV
Lead Channel Sensing Intrinsic Amplitude: 8.875 mV
Lead Channel Setting Pacing Amplitude: 1.25 V
Lead Channel Setting Pacing Amplitude: 1.5 V
Lead Channel Setting Pacing Amplitude: 2.75 V
Lead Channel Setting Pacing Pulse Width: 0.4 ms
Lead Channel Setting Pacing Pulse Width: 0.5 ms
Lead Channel Setting Sensing Sensitivity: 0.3 mV
Zone Setting Status: 755011

## 2022-03-05 ENCOUNTER — Emergency Department (HOSPITAL_COMMUNITY): Payer: Medicare HMO

## 2022-03-05 ENCOUNTER — Encounter (HOSPITAL_COMMUNITY): Payer: Self-pay

## 2022-03-05 ENCOUNTER — Emergency Department (HOSPITAL_COMMUNITY)
Admission: EM | Admit: 2022-03-05 | Discharge: 2022-03-05 | Disposition: A | Discharge status: Home / Self Care | Payer: Medicare HMO | Attending: Emergency Medicine | Admitting: Emergency Medicine

## 2022-03-05 ENCOUNTER — Other Ambulatory Visit: Payer: Self-pay

## 2022-03-05 DIAGNOSIS — I509 Heart failure, unspecified: Secondary | ICD-10-CM | POA: Diagnosis not present

## 2022-03-05 DIAGNOSIS — Z7982 Long term (current) use of aspirin: Secondary | ICD-10-CM | POA: Insufficient documentation

## 2022-03-05 DIAGNOSIS — Z955 Presence of coronary angioplasty implant and graft: Secondary | ICD-10-CM | POA: Insufficient documentation

## 2022-03-05 DIAGNOSIS — I2510 Atherosclerotic heart disease of native coronary artery without angina pectoris: Secondary | ICD-10-CM | POA: Diagnosis not present

## 2022-03-05 DIAGNOSIS — Z7901 Long term (current) use of anticoagulants: Secondary | ICD-10-CM | POA: Insufficient documentation

## 2022-03-05 DIAGNOSIS — R079 Chest pain, unspecified: Secondary | ICD-10-CM

## 2022-03-05 DIAGNOSIS — Z952 Presence of prosthetic heart valve: Secondary | ICD-10-CM | POA: Diagnosis not present

## 2022-03-05 DIAGNOSIS — R0789 Other chest pain: Secondary | ICD-10-CM | POA: Insufficient documentation

## 2022-03-05 LAB — BASIC METABOLIC PANEL
Anion gap: 11 (ref 5–15)
BUN: <5 mg/dL — ABNORMAL LOW (ref 6–20)
CO2: 21 mmol/L — ABNORMAL LOW (ref 22–32)
Calcium: 8.9 mg/dL (ref 8.9–10.3)
Chloride: 106 mmol/L (ref 98–111)
Creatinine, Ser: 0.77 mg/dL (ref 0.44–1.00)
GFR, Estimated: >60 mL/min (ref >60)
Glucose, Bld: 105 mg/dL — ABNORMAL HIGH (ref 70–99)
Potassium: 3.4 mmol/L — ABNORMAL LOW (ref 3.5–5.1)
Sodium: 138 mmol/L (ref 135–145)

## 2022-03-05 LAB — HEPATIC FUNCTION PANEL
ALT: 16 U/L (ref 0–44)
AST: 25 U/L (ref 15–41)
Albumin: 3.5 g/dL (ref 3.5–5.0)
Alkaline Phosphatase: 85 U/L (ref 38–126)
Bilirubin, Direct: 0.1 mg/dL (ref 0.0–0.2)
Indirect Bilirubin: 0.6 mg/dL (ref 0.3–0.9)
Total Bilirubin: 0.7 mg/dL (ref 0.3–1.2)
Total Protein: 7 g/dL (ref 6.5–8.1)

## 2022-03-05 LAB — CBC
HCT: 46.7 % — ABNORMAL HIGH (ref 36.0–46.0)
Hemoglobin: 15.3 g/dL — ABNORMAL HIGH (ref 12.0–15.0)
MCH: 29.2 pg (ref 26.0–34.0)
MCHC: 32.8 g/dL (ref 30.0–36.0)
MCV: 89.1 fL (ref 80.0–100.0)
Platelets: 192 10*3/uL (ref 150–400)
RBC: 5.24 MIL/uL — ABNORMAL HIGH (ref 3.87–5.11)
RDW: 14 % (ref 11.5–15.5)
WBC: 7.6 10*3/uL (ref 4.0–10.5)
nRBC: 0 % (ref 0.0–0.2)

## 2022-03-05 LAB — LIPASE, BLOOD: Lipase: 28 U/L (ref 11–51)

## 2022-03-05 LAB — HCG, QUANTITATIVE, PREGNANCY: hCG, Beta Chain, Quant, S: 4 m[IU]/mL (ref <5)

## 2022-03-05 LAB — I-STAT BETA HCG BLOOD, ED (MC, WL, AP ONLY): I-stat hCG, quantitative: 7.4 m[IU]/mL — ABNORMAL HIGH (ref <5)

## 2022-03-05 LAB — TROPONIN I (HIGH SENSITIVITY)
Troponin I (High Sensitivity): 11 ng/L (ref <18)
Troponin I (High Sensitivity): 15 ng/L (ref <18)

## 2022-03-05 MED ORDER — NICOTINE 21 MG/24HR TD PT24: 21.0000 mg | MEDICATED_PATCH | Freq: Every day | TRANSDERMAL | 1 refills | Status: DC

## 2022-03-05 MED ORDER — POTASSIUM CHLORIDE CRYS ER 20 MEQ PO TBCR
40.0000 meq | EXTENDED_RELEASE_TABLET | Freq: Once | ORAL | Status: AC
  Administered 2022-03-05: 40 meq via ORAL
  Filled 2022-03-05: qty 2

## 2022-03-05 MED ORDER — LOSARTAN POTASSIUM 50 MG PO TABS
25.0000 mg | ORAL_TABLET | Freq: Once | ORAL | Status: AC
  Administered 2022-03-05: 25 mg via ORAL
  Filled 2022-03-05: qty 1

## 2022-03-05 MED ORDER — FENTANYL CITRATE PF 50 MCG/ML IJ SOSY
25.0000 ug | PREFILLED_SYRINGE | Freq: Once | INTRAMUSCULAR | Status: AC
  Administered 2022-03-05: 25 ug via INTRAVENOUS
  Filled 2022-03-05: qty 1

## 2022-03-05 MED ORDER — LIDOCAINE 4 % EX PTCH: 1.0000 | MEDICATED_PATCH | CUTANEOUS | 0 refills | Status: DC

## 2022-03-05 MED ORDER — OXYCODONE-ACETAMINOPHEN 5-325 MG PO TABS: 1.0000 | ORAL_TABLET | Freq: Four times a day (QID) | ORAL | 0 refills | Status: DC | PRN

## 2022-03-05 MED ORDER — IOHEXOL 350 MG/ML SOLN
100.0000 mL | Freq: Once | INTRAVENOUS | Status: AC | PRN
  Administered 2022-03-05: 100 mL via INTRAVENOUS

## 2022-03-05 NOTE — Discharge Instructions (Addendum)
You were seen in the emergency department today for chest pain. You workup did not reveal a definite cause of your symptoms but was generally reassuring.   You can take Tylenol and ibuprofen as needed for pain control and use lidocaine patch as prescribed.  However, if pain is still severe you can use the Percocet as needed.  Do not take while drinking alcohol or driving a car.  Return to the emergency department immediately if you develop recurrent, severe chest pain, shortness of breath, fainting spells, sudden sweatiness, or any other concerning symptoms.   Please also make an appointment to follow up with your primary care doctor or cardiologist within one week to assure improvement or resolution in symptoms. Further testing may be necessary, so it is extremely important to keep your follow-up appointment with your primary doctor.

## 2022-03-05 NOTE — ED Notes (Signed)
Lab called at this time in regards to pt's repeat troponin that was sent at 1302. Lab unable to locate specimen. New repeat troponin collected and sent.

## 2022-03-05 NOTE — ED Notes (Signed)
AVS reviewed with pt prior to discharge. Pt verbalizes understanding. Belongings with pt upon depart. Ambulatory to POV by self.

## 2022-03-05 NOTE — ED Provider Notes (Signed)
[facility] Provider Note   CSN: [id number] Arrival date & time: 03/05/22  1114     History  Chief Complaint  Patient presents with   Chest Pain    Lindsay Camacho is a 57 y.o. female. 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  Woke up from sleep around 2:30 in the morning with reported " burning" chest pain which eventually went away.  This was not associated with shortness of breath, diaphoresis, nausea or vomiting, reflux or abdominal pain.  Then later throughout the day today she has had multiple episodes of where she feels like she has been shocked into her heart.  However, she denies any history of having her defibrillator going off.  She has felt this multiple times throughout the day.  She feels like she is having severe pain going throughout her left arm.  She has had no fevers, no shortness of breath, no history of PE or DVT.  She not take any medicines at home for pain.  She did not take any nitroglycerin.  She has had no syncopal episodes.   Chest Pain      Home Medications Prior to Admission medications   Medication Sig Start Date End Date Taking? Authorizing Provider  aspirin EC 81 MG tablet Take 81 mg by mouth daily.   Yes [provider]  atorvastatin (LIPITOR) 80 MG tablet Take 1 tablet (80 mg total) by mouth daily. 09/07/20  Yes Evans Lance, MD  Bempedoic Acid (NEXLETOL) 180 MG TABS Take 1 tablet by mouth daily. 11/04/21  Yes Jettie Booze, MD  carvedilol (COREG) 6.25 MG tablet Take 1 tablet (6.25 mg total) by mouth 2 (two) times daily. 10/20/21  Yes Jettie Booze, MD  clopidogrel (PLAVIX) 75 MG tablet Take 1 tablet (75 mg total) by mouth daily. 09/10/20  Yes Evans Lance, MD  ergocalciferol (VITAMIN D2) 1.25 MG (50000 UT)  capsule Take 1 capsule (50,000 Units total) by mouth every Friday. Patient taking differently: Take 50,000 Units by mouth once a week. 02/10/22  Yes Wellington Hampshire, MD  furosemide (LASIX) 40 MG tablet Take 1 tablet (40 mg total) by mouth daily as needed for fluid or edema (shortness of breath). 03/05/20  Yes Shirley Friar, PA-C  lidocaine 4 % Place 1 patch onto the skin daily. 03/05/22  Yes Elgie Congo, MD  losartan (COZAAR) 25 MG tablet Take 1 tablet (25 mg total) by mouth daily. 02/07/22  Yes Wellington Hampshire, MD  Menthol-Methyl Salicylate (SALONPAS PAIN RELIEF PATCH EX) Apply 1 patch topically daily as needed (pain).   Yes [provider]  nicotine (NICODERM CQ) 21 mg/24hr patch Place 1 patch (21 mg total) onto the skin daily. 03/05/22  Yes Elgie Congo, MD  oxyCODONE-acetaminophen (PERCOCET/ROXICET) 5-325 MG tablet Take 1 tablet by mouth every 6 (six) hours as needed for up to 10 doses for severe pain. 03/05/22  Yes Elgie Congo, MD  pantoprazole (PROTONIX) 40 MG tablet Take 1 tablet (40 mg total) by mouth daily. 02/07/22  Yes Wellington Hampshire, MD  potassium chloride (KLOR-CON) 10 MEQ tablet Take 2 tablets (20 mEq total) by mouth daily. 09/10/20  Yes Evans Lance, MD  spironolactone (ALDACTONE) 25 MG tablet Take 0.5 tablets (12.5 mg total) by mouth  daily. 02/07/22  Yes Wellington Hampshire, MD  nicotine (NICODERM CQ - DOSED IN MG/24 HOURS) 14 mg/24hr patch Apply daily for 6 weeks, then decrease to '7mg'$  daily for 2 weeks Patient not taking: Reported on 03/05/2022 11/07/21   Jettie Booze, MD  nicotine (NICODERM CQ - DOSED IN MG/24 HR) 7 mg/24hr patch Place 1 patch (7 mg total) onto the skin daily. Patient not taking: Reported on 03/05/2022 11/07/21   Jettie Booze, MD      Allergies    Aldomet [methyldopa], Brilinta [ticagrelor], Other, Coumadin [warfarin sodium], Desyrel [trazodone], Eliquis [apixaban], Entresto [sacubitril-valsartan], Zestril [lisinopril],  and Penicillins    Review of Systems   Review of Systems  Cardiovascular:  Positive for chest pain.    Physical Exam Updated Vital Signs BP (!) 171/69   Pulse 72   Temp (!) 97.4 F (36.3 C) (Oral)   Resp 11   Ht '5\' 1"'$  (1.549 m)   Wt 46.7 kg   SpO2 100%   BMI 19.46 kg/m  Physical Exam Constitutional: Alert and oriented.  Chronically ill-appearing but no acute distress Eyes: Conjunctivae are normal. ENT      Head: Normocephalic and atraumatic. Cardiovascular: S1, S2, regular rate, equal palpable radial and PT pulses. Chest wall with ttp of the left lateral chest wall no rash or crepitus present. No edema, erythema, dilated veins of chest wall Respiratory: Normal respiratory effort. Breath sounds are normal. O2 sat 98 on RA Gastrointestinal: Soft and nontender.  Musculoskeletal: Normal range of motion in all extremities.  No pitting edema of lower extremities.  No gross abnormality of left upper extremity.  Palpable radial pulses.  Full range of motion intact.  No erythema, warmth, edema or dilated veins. Neurologic: Normal speech and language.  Moving extremities x 4.  Sensation grossly intact.  No facial droop. Skin: Skin is warm, dry  Psychiatric: Mood and affect are normal. Speech and behavior are normal.  ED Results / Procedures / Treatments   Labs (all labs ordered are listed, but only abnormal results are displayed) Labs Reviewed  BASIC METABOLIC PANEL - Abnormal; Notable for the following components:      Result Value   Potassium 3.4 (*)    CO2 21 (*)    Glucose, Bld 105 (*)    BUN <5 (*)    All other components within normal limits  CBC - Abnormal; Notable for the following components:   RBC 5.24 (*)    Hemoglobin 15.3 (*)    HCT 46.7 (*)    All other components within normal limits  I-STAT BETA HCG BLOOD, ED ([facility], [facility], [facility] ONLY) - Abnormal; Notable for the following components:   I-stat hCG, quantitative 7.4 (*)    All other components within normal limits   LIPASE, BLOOD  HEPATIC FUNCTION PANEL  HCG, QUANTITATIVE, PREGNANCY  TROPONIN I (HIGH SENSITIVITY)  TROPONIN I (HIGH SENSITIVITY)    EKG EKG Interpretation  Date/Time:  Sunday March 05 2022 11:23:22 EST Ventricular Rate:  83 PR Interval:  128 QRS Duration: 142 QT Interval:  449 QTC Calculation: 528 R Axis:   -71 Text Interpretation: Sinus rhythm Biatrial enlargement Paced rhythm No significant change since last tracing Confirmed by Georgina Snell [phone]) on 03/05/2022 11:33:53 AM  Radiology CT ANGIO UP EXTREM LEFT W &/OR WO CONTAST  Result Date: 03/05/2022 CLINICAL DATA:  Severe chest pain radiating to left arm. History of left proximal subclavian artery stenting last year. EXAM: CT ANGIOGRAPHY OF THE LEFT UPPEREXTREMITY TECHNIQUE: Multidetector  CT imaging of the left upperwas performed using the standard protocol during bolus administration of intravenous contrast. Multiplanar CT image reconstructions and MIPs were obtained to evaluate the vascular anatomy. RADIATION DOSE REDUCTION: This exam was performed according to the departmental dose-optimization program which includes automated exposure control, adjustment of the mA and/or kV according to patient size and/or use of iterative reconstruction technique. CONTRAST:  161m OMNIPAQUE IOHEXOL 350 MG/ML SOLN COMPARISON:  None Available. FINDINGS: Vascular: Prior stenting of the left proximal subclavian artery. The stent is widely patent. The left subclavian, axillary, brachial, radial, and ulnar arteries are unremarkable. Bones/Joint/Cartilage No fracture or dislocation. Joint spaces are preserved. No joint effusion. Ligaments Ligaments are suboptimally evaluated by CT. Muscles and Tendons Grossly intact.  No muscle atrophy. Soft tissue No fluid collection or hematoma.  No soft tissue mass. Review of the MIP images confirms the above findings. IMPRESSION: 1. Prior stenting of the left proximal subclavian artery. The stent is widely patent. 2.  Otherwise unremarkable CTA of the left upper extremity. Electronically Signed   By: WTitus DubinM.D.   On: 03/05/2022 15:02   CT Angio Chest Aorta W and/or Wo Contrast  Result Date: 03/05/2022 CLINICAL DATA:  Left-sided chest pain radiating to the left arm. History left subclavian artery stenosis status post stenting last year. EXAM: CT ANGIOGRAPHY CHEST WITH CONTRAST TECHNIQUE: Multidetector CT imaging of the chest was performed using the standard protocol during bolus administration of intravenous contrast. Multiplanar CT image reconstructions and MIPs were obtained to evaluate the vascular anatomy. RADIATION DOSE REDUCTION: This exam was performed according to the departmental dose-optimization program which includes automated exposure control, adjustment of the mA and/or kV according to patient size and/or use of iterative reconstruction technique. CONTRAST:  1054mOMNIPAQUE IOHEXOL 350 MG/ML SOLN COMPARISON:  CT chest dated August 28, 2021. FINDINGS: Cardiovascular: Preferential opacification of the thoracic aorta. No evidence of thoracic aortic aneurysm or dissection. Coronary, aortic arch, and branch vessel atherosclerotic vascular disease. Interval stenting of the proximal left subclavian artery with resolved stenosis. Chronic cardiomegaly with prominent right heart enlargement. No pericardial effusion. No pulmonary embolism. Reflux of contrast into the IVC and hepatic veins. Left chest wall AICD. Mediastinum/Nodes: No enlarged mediastinal, hilar, or axillary lymph nodes. Thyroid gland, trachea, and esophagus demonstrate no significant findings. Lungs/Pleura: Central peribronchial thickening. Unchanged scarring in the right upper lobe. No focal consolidation, pleural effusion, or pneumothorax. Upper Abdomen: No acute abnormality. Musculoskeletal: No chest wall abnormality. No acute or significant osseous findings. Review of the MIP images confirms the above findings. IMPRESSION: 1. No evidence of  thoracic aortic aneurysm or dissection. 2. Interval stenting of the proximal left subclavian artery with resolved stenosis. 3. Chronic cardiomegaly with evidence of right heart dysfunction. 4.  Aortic atherosclerosis (ICD10-I70.0). Electronically Signed   By: WiTitus Dubin.D.   On: 03/05/2022 14:56   DG Chest 2 View  Result Date: 03/05/2022 CLINICAL DATA:  chest pain EXAM: CHEST - 2 VIEW COMPARISON:  August 28, 2021 FINDINGS: The cardiomediastinal silhouette is unchanged in contour.LEFT chest AICD. Status post mitral valve replacement. LEFT subclavian stent. No pleural effusion. No pneumothorax. No acute pleuroparenchymal abnormality. Visualized abdomen is unremarkable. Vascular calcifications. Surgical clips project over the RIGHT axilla. IMPRESSION: No acute cardiopulmonary abnormality. Electronically Signed   By: StValentino Saxon.D.   On: 03/05/2022 12:12    Procedures Procedures  Remain on constant cardiac monitoring, paced rhythm, normal rates.  Medications Ordered in ED Medications  fentaNYL (SUBLIMAZE) injection 25 mcg (25 mcg  Intravenous Given 03/05/22 1208)  potassium chloride SA (KLOR-CON M) CR tablet 40 mEq (40 mEq Oral Given 03/05/22 1257)  iohexol (OMNIPAQUE) 350 MG/ML injection 100 mL (100 mLs Intravenous Contrast Given 03/05/22 1438)  losartan (COZAAR) tablet 25 mg (25 mg Oral Given 03/05/22 1536)    ED Course/ Medical Decision Making/ A&P Clinical Course as of 03/05/22 1717  Sun Mar 05, 2022  1713 Reassessed patient who notes improvement in pain.  She only has pain that is reproducible on exam of the left chest wall.  There is no rash present.  Did offer patient observation overnight for continued atypical chest pain however she declined at this time.  She has follow-up with her cardiologist as planned on Tuesday.  Do suspect patient's pain is more likely musculoskeletal in nature.  Especially with reassuring workup today with imaging and troponins.  Advise close follow-up with  cardiologist, strict return precautions.  She is in agreement with plan and safe for discharge at this time. [VB]    Clinical Course User Index [VB] Elgie Congo, MD                            Medical Decision Making Lindsay Camacho is a 57 y.o. female. 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   EKG obtained atrial sensed ventricular paced with no acute ST/T changes from prior.  No STEMI. HEART score 3.  ICD was interrogated.  Since December 20, 2021 there have been no arrhythmias detected and no therapies delivered.  There have been no VT/VF terminated episodes since December 19.  No shocks have been delivered.  Patient chest pain is atypical in nature and reproducible on exam.  Suspect more likely musculoskeletal etiology.  Her troponins were reassuring as well as EKG initial 11 repeat 15.  She had a CTA aorta and CTA of the left upper extremity obtained to further rule out any evidence of reoccluded stent or other vascular abnormalities although less likely with no pulse deficits on exam.  I did personally review this imaging.  It was reassuring and showed patent stenting of left subclavian artery.  Additionally no evidence of thoracic aortic aneurysm or dissection.  No pneumonia, no other acute findings.  Reassessed patient who notes improvement in pain with 1 dose fentanyl here.  She only has pain that is reproducible on exam of the left chest wall.  There is no rash present.  Did offer patient observation overnight for continued atypical chest pain however she declined at this time.  She has follow-up with her cardiologist as planned on Tuesday.  Do suspect patient's pain is more likely musculoskeletal in nature.  Especially with reassuring workup today with imaging and troponins.  Advise close follow-up with  cardiologist, strict return precautions.  She is in agreement with plan and safe for discharge at this time.   Amount and/or Complexity of Data Reviewed Labs: ordered. Radiology: ordered.  Risk OTC drugs. Prescription drug management.   Final Clinical Impression(s) / ED Diagnoses Final diagnoses:  Chest pain, unspecified type  Chest wall pain    Rx / DC Orders ED Discharge Orders          Ordered    oxyCODONE-acetaminophen (PERCOCET/ROXICET) 5-325 MG tablet  Every 6 hours PRN  03/05/22 1711    lidocaine 4 %  Every 24 hours        03/05/22 1711    nicotine (NICODERM CQ) 21 mg/24hr patch  Daily        03/05/22 1711              Elgie Congo, MD 03/05/22 1717

## 2022-03-05 NOTE — ED Triage Notes (Addendum)
Pt reports she went to bed last night with a burning sensation in her chest. Pt states around 0930 the pain became more severe and now radiates to her left arm. Denies sob. Pt AxOx4. Reports significant cardiac hx. Has a [employer], [employer]. States the pain in her chest feels like a shocking pain.

## 2022-03-05 NOTE — ED Notes (Signed)
Patient transported to X-ray 

## 2022-03-13 ENCOUNTER — Emergency Department (HOSPITAL_COMMUNITY): Payer: Medicare HMO

## 2022-03-13 ENCOUNTER — Encounter (HOSPITAL_COMMUNITY): Payer: Self-pay | Admitting: Emergency Medicine

## 2022-03-13 ENCOUNTER — Observation Stay (HOSPITAL_COMMUNITY)
Admission: EM | Admit: 2022-03-13 | Discharge: 2022-03-16 | Disposition: A | Discharge status: Home / Self Care | Payer: Medicare HMO | Attending: Internal Medicine | Admitting: Internal Medicine

## 2022-03-13 DIAGNOSIS — Z950 Presence of cardiac pacemaker: Secondary | ICD-10-CM | POA: Diagnosis not present

## 2022-03-13 DIAGNOSIS — R2681 Unsteadiness on feet: Secondary | ICD-10-CM | POA: Diagnosis not present

## 2022-03-13 DIAGNOSIS — I110 Hypertensive heart disease with heart failure: Secondary | ICD-10-CM | POA: Insufficient documentation

## 2022-03-13 DIAGNOSIS — I447 Left bundle-branch block, unspecified: Secondary | ICD-10-CM | POA: Diagnosis not present

## 2022-03-13 DIAGNOSIS — I5032 Chronic diastolic (congestive) heart failure: Secondary | ICD-10-CM | POA: Insufficient documentation

## 2022-03-13 DIAGNOSIS — Z955 Presence of coronary angioplasty implant and graft: Secondary | ICD-10-CM | POA: Insufficient documentation

## 2022-03-13 DIAGNOSIS — I4719 Other supraventricular tachycardia: Secondary | ICD-10-CM | POA: Diagnosis present

## 2022-03-13 DIAGNOSIS — I2510 Atherosclerotic heart disease of native coronary artery without angina pectoris: Secondary | ICD-10-CM | POA: Diagnosis not present

## 2022-03-13 DIAGNOSIS — I5042 Chronic combined systolic (congestive) and diastolic (congestive) heart failure: Secondary | ICD-10-CM | POA: Diagnosis not present

## 2022-03-13 DIAGNOSIS — Z8616 Personal history of COVID-19: Secondary | ICD-10-CM | POA: Insufficient documentation

## 2022-03-13 DIAGNOSIS — Z95810 Presence of automatic (implantable) cardiac defibrillator: Secondary | ICD-10-CM | POA: Diagnosis not present

## 2022-03-13 DIAGNOSIS — Z720 Tobacco use: Secondary | ICD-10-CM | POA: Diagnosis present

## 2022-03-13 DIAGNOSIS — Z79899 Other long term (current) drug therapy: Secondary | ICD-10-CM | POA: Insufficient documentation

## 2022-03-13 DIAGNOSIS — I639 Cerebral infarction, unspecified: Secondary | ICD-10-CM | POA: Diagnosis not present

## 2022-03-13 DIAGNOSIS — Z7902 Long term (current) use of antithrombotics/antiplatelets: Secondary | ICD-10-CM | POA: Diagnosis not present

## 2022-03-13 DIAGNOSIS — Z7982 Long term (current) use of aspirin: Secondary | ICD-10-CM | POA: Diagnosis not present

## 2022-03-13 DIAGNOSIS — R4781 Slurred speech: Secondary | ICD-10-CM | POA: Diagnosis present

## 2022-03-13 DIAGNOSIS — Z9889 Other specified postprocedural states: Secondary | ICD-10-CM

## 2022-03-13 DIAGNOSIS — I10 Essential (primary) hypertension: Secondary | ICD-10-CM | POA: Diagnosis present

## 2022-03-13 DIAGNOSIS — F17210 Nicotine dependence, cigarettes, uncomplicated: Secondary | ICD-10-CM | POA: Insufficient documentation

## 2022-03-13 LAB — BASIC METABOLIC PANEL
Anion gap: 13 (ref 5–15)
BUN: 15 mg/dL (ref 6–20)
CO2: 20 mmol/L — ABNORMAL LOW (ref 22–32)
Calcium: 9.7 mg/dL (ref 8.9–10.3)
Chloride: 105 mmol/L (ref 98–111)
Creatinine, Ser: 0.97 mg/dL (ref 0.44–1.00)
GFR, Estimated: >60 mL/min (ref >60)
Glucose, Bld: 95 mg/dL (ref 70–99)
Potassium: 4.4 mmol/L (ref 3.5–5.1)
Sodium: 138 mmol/L (ref 135–145)

## 2022-03-13 LAB — CBC WITH DIFFERENTIAL/PLATELET
Abs Immature Granulocytes: 0.02 10*3/uL (ref 0.00–0.07)
Basophils Absolute: 0.1 10*3/uL (ref 0.0–0.1)
Basophils Relative: 1 %
Eosinophils Absolute: 0.2 10*3/uL (ref 0.0–0.5)
Eosinophils Relative: 2 %
HCT: 51.8 % — ABNORMAL HIGH (ref 36.0–46.0)
Hemoglobin: 17.4 g/dL — ABNORMAL HIGH (ref 12.0–15.0)
Immature Granulocytes: 0 %
Lymphocytes Relative: 29 %
Lymphs Abs: 2.7 10*3/uL (ref 0.7–4.0)
MCH: 29.5 pg (ref 26.0–34.0)
MCHC: 33.6 g/dL (ref 30.0–36.0)
MCV: 87.9 fL (ref 80.0–100.0)
Monocytes Absolute: 0.8 10*3/uL (ref 0.1–1.0)
Monocytes Relative: 9 %
Neutro Abs: 5.4 10*3/uL (ref 1.7–7.7)
Neutrophils Relative %: 59 %
Platelets: 213 10*3/uL (ref 150–400)
RBC: 5.89 MIL/uL — ABNORMAL HIGH (ref 3.87–5.11)
RDW: 13.7 % (ref 11.5–15.5)
WBC: 9.1 10*3/uL (ref 4.0–10.5)
nRBC: 0 % (ref 0.0–0.2)

## 2022-03-13 LAB — TROPONIN I (HIGH SENSITIVITY): Troponin I (High Sensitivity): 17 ng/L (ref <18)

## 2022-03-13 LAB — MAGNESIUM: Magnesium: 1.8 mg/dL (ref 1.7–2.4)

## 2022-03-13 NOTE — ED Provider Triage Note (Signed)
Emergency Medicine Provider Triage Evaluation Note  Lindsay Camacho , a 57 y.o. female  was evaluated in triage.  Pt complains of heart racing, onset 1 hour prior to arrival, with shortness of breath.  Has a pacer defibrillator ([employer]).  Reports similar episodes previously  Review of Systems  Positive:  Negative:   Physical Exam  There were no vitals taken for this visit. Gen:   Awake, anxious, uncomfortable   Resp:  Tachypneic MSK:   Moves extremities without difficulty  Other:    Medical Decision Making  Medically screening exam initiated at 5:26 PM.  Appropriate orders placed.  [name] was informed that the remainder of the evaluation will be completed by another provider, this initial triage assessment does not replace that evaluation, and the importance of remaining in the ED until their evaluation is complete.     [name], PA-C 03/13/22 1727

## 2022-03-13 NOTE — ED Provider Notes (Signed)
[facility] Provider Note   CSN: [id number] Arrival date & time: 03/13/22  1716     History  No chief complaint on file.   Lindsay Camacho is a 57 y.o. female.  HPI 57 year old female with a history of CAD, cardiomyopathy, cardiac defibrillator and pacemaker, tobacco abuse, presents with palpitations and chest pain.  She has been dealing with palpitations for over a week.  She was seen here on 3/3.  She states has been having similar symptoms ever since.  Over the last couple days she started to feel lightheaded like she might pass out or fall.  She been having continued palpitations on and off as well as on and off chest pain whenever the palpitations stop.  She also had a transient headache behind her right eye that went away but has been having slurred speech for about a week.  No focal weakness.  Home Medications Prior to Admission medications   Medication Sig Start Date End Date Taking? Authorizing Provider  aspirin EC 81 MG tablet Take 81 mg by mouth daily.   Yes [provider]  atorvastatin (LIPITOR) 80 MG tablet Take 1 tablet (80 mg total) by mouth daily. 09/07/20  Yes Evans Lance, MD  carvedilol (COREG) 6.25 MG tablet Take 1 tablet (6.25 mg total) by mouth 2 (two) times daily. 10/20/21  Yes Jettie Booze, MD  clopidogrel (PLAVIX) 75 MG tablet Take 1 tablet (75 mg total) by mouth daily. 09/10/20  Yes Evans Lance, MD  ergocalciferol (VITAMIN D2) 1.25 MG (50000 UT) capsule Take 1 capsule (50,000 Units total) by mouth every Friday. Patient taking differently: Take 50,000 Units by mouth once a week. 02/10/22  Yes Wellington Hampshire, MD  furosemide (LASIX) 40 MG tablet Take 1 tablet (40 mg total) by mouth daily as needed for fluid or edema (shortness of breath). 03/05/20  Yes Shirley Friar, PA-C  losartan (COZAAR) 25 MG tablet Take 1 tablet (25 mg total) by mouth daily. 02/07/22  Yes Wellington Hampshire, MD   Menthol-Methyl Salicylate (SALONPAS PAIN RELIEF PATCH EX) Apply 1 patch topically daily as needed (pain).   Yes [provider]  pantoprazole (PROTONIX) 40 MG tablet Take 1 tablet (40 mg total) by mouth daily. 02/07/22  Yes Wellington Hampshire, MD  potassium chloride (KLOR-CON) 10 MEQ tablet Take 2 tablets (20 mEq total) by mouth daily. 09/10/20  Yes Evans Lance, MD  spironolactone (ALDACTONE) 25 MG tablet Take 0.5 tablets (12.5 mg total) by mouth daily. 02/07/22  Yes Wellington Hampshire, MD  Bempedoic Acid (NEXLETOL) 180 MG TABS Take 1 tablet by mouth daily. Patient not taking: Reported on 03/13/2022 11/04/21   Jettie Booze, MD  lidocaine 4 % Place 1 patch onto the skin daily. Patient not taking: Reported on 03/13/2022 03/05/22   Elgie Congo, MD  nicotine (NICODERM CQ - DOSED IN MG/24 HOURS) 14 mg/24hr patch Apply daily for 6 weeks, then decrease to '7mg'$  daily for 2 weeks Patient not taking: Reported on 03/05/2022 11/07/21   Jettie Booze, MD  nicotine (NICODERM CQ - DOSED IN MG/24 HR) 7 mg/24hr patch Place 1 patch (7 mg total) onto the skin daily. Patient not taking: Reported on 03/05/2022 11/07/21   Jettie Booze, MD  nicotine (NICODERM CQ) 21 mg/24hr patch Place 1 patch (21 mg total) onto the skin daily. Patient not taking: Reported on 03/13/2022 03/05/22   Elgie Congo, MD  oxyCODONE-acetaminophen (PERCOCET/ROXICET)  5-325 MG tablet Take 1 tablet by mouth every 6 (six) hours as needed for up to 10 doses for severe pain. Patient not taking: Reported on 03/13/2022 03/05/22   Elgie Congo, MD      Allergies    Aldomet [methyldopa], Brilinta [ticagrelor], Other, Coumadin [warfarin sodium], Desyrel [trazodone], Eliquis [apixaban], Entresto [sacubitril-valsartan], Zestril [lisinopril], and Penicillins    Review of Systems   Review of Systems  Constitutional:  Positive for diaphoresis.  Cardiovascular:  Positive for chest pain and palpitations.  Neurological:   Positive for light-headedness and headaches (now gone).    Physical Exam Updated Vital Signs BP (!) 150/88   Pulse 87   Temp 97.6 F (36.4 C) (Oral)   Resp 16   SpO2 94%  Physical Exam Vitals and nursing note reviewed.  Constitutional:      Appearance: She is well-developed.  HENT:     Head: Normocephalic and atraumatic.  Eyes:     Extraocular Movements: Extraocular movements intact.     Pupils: Pupils are equal, round, and reactive to light.  Cardiovascular:     Rate and Rhythm: Regular rhythm. Tachycardia present.     Heart sounds: Normal heart sounds.  Pulmonary:     Effort: Pulmonary effort is normal.     Breath sounds: Normal breath sounds.  Abdominal:     Palpations: Abdomen is soft.     Tenderness: There is no abdominal tenderness.  Musculoskeletal:     Right lower leg: No edema.     Left lower leg: No edema.  Skin:    General: Skin is warm and dry.  Neurological:     Mental Status: She is alert.     Comments: CN 3-12 grossly intact. 5/5 strength in all 4 extremities. Grossly normal sensation.     ED Results / Procedures / Treatments   Labs (all labs ordered are listed, but only abnormal results are displayed) Labs Reviewed  CBC WITH DIFFERENTIAL/PLATELET - Abnormal; Notable for the following components:      Result Value   RBC 5.89 (*)    Hemoglobin 17.4 (*)    HCT 51.8 (*)    All other components within normal limits  BASIC METABOLIC PANEL - Abnormal; Notable for the following components:   CO2 20 (*)    All other components within normal limits  MAGNESIUM  TROPONIN I (HIGH SENSITIVITY)    EKG None  Radiology CT Head Wo Contrast  Result Date: 03/13/2022 CLINICAL DATA:  57 year old female with acute neuro deficit EXAM: CT HEAD WITHOUT CONTRAST TECHNIQUE: Contiguous axial images were obtained from the base of the skull through the vertex without intravenous contrast. RADIATION DOSE REDUCTION: This exam was performed according to the departmental  dose-optimization program which includes automated exposure control, adjustment of the mA and/or kV according to patient size and/or use of iterative reconstruction technique. COMPARISON:  08/14/2021 CT and prior studies FINDINGS: Brain: A new 8 mm hypodensity within the white matter adjacent to the LEFT basal ganglia is compatible with age indeterminate infarct. There is no evidence of hemorrhage. There is no evidence of mass lesion or mass effect, hydrocephalus, extra-axial collection or other infarct. Mild atrophy noted. Vascular: Carotid atherosclerotic calcifications are noted. Skull: Normal. Negative for fracture or focal lesion. Sinuses/Orbits: No acute finding. Other: None. IMPRESSION: 1. 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 Consider MRI for further evaluation as clinically indicated. 2. Mild atrophy. Electronically Signed   By: Margarette Canada M.D.   On: 03/13/2022 18:56  DG Chest Port 1 View  Result Date: 03/13/2022 CLINICAL DATA:  Tachycardia, chest pain, short of breath EXAM: PORTABLE CHEST 1 VIEW COMPARISON:  03/05/2022 FINDINGS: Single frontal view of the chest demonstrates multi lead pacer/AICD unchanged. Postsurgical changes of the mitral valve. Stable mild cardiomegaly. No acute airspace disease, effusion, or pneumothorax. Stable stent in the left subclavian distribution. No acute bony abnormality. IMPRESSION: 1. Stable chest, no acute process. Electronically Signed   By: Randa Ngo M.D.   On: 03/13/2022 18:38    Procedures Procedures    Medications Ordered in ED Medications - No data to display  ED Course/ Medical Decision Making/ A&P Clinical Course as of 03/13/22 2330  Mon Mar 13, 2022  1824 I discussed with Dr. Gasper Sells.  Feels it might be flutter.  However it is complicated and he will send someone to come see. [SG]  1913 Discussed with Dr.  Sallyanne Kuster.  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  She is now feeling a lot better. [SG]     Clinical Course User Index [SG] Sherwood Gambler, MD                             Medical Decision Making Amount and/or Complexity of Data Reviewed Labs: ordered.    Details: Normal troponin.  No significant electrolyte disturbance. Radiology: ordered.    Details: No head bleed.  Pacer without obvious break. ECG/medicine tests: independent interpretation performed.    Details: Regular but wide-complex tachycardia.  Risk Decision regarding hospitalization.   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  Otherwise, this was resolved with cardiology intervention.  She feels like all of her symptoms improved.  However a CT head had been obtained given multiple days of speech change.  There is a concern for a possible left basal ganglia stroke though it is unclear when.  Discussed with Dr. Leonel Ramsay, recommends shared decision-making as this could be stroke versus her symptoms could have been related to her heart rate did seem proved with rate control.  She will subsequently get evaluated.  Cannot get MRI tonight because of her pacemaker though it does seem MRI capable.  Thus we will admit and get MRI tomorrow.  No reps tonight.  Discussed with Dr. Nevada Crane.        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

## 2022-03-13 NOTE — ED Triage Notes (Signed)
Pt  here from home with c/o chest pain and sob that started today while she sitting in a chair , pt heart 150's in triage

## 2022-03-13 NOTE — Progress Notes (Signed)
Cardiology Consultation   Patient ID: SCOTLYNN SORG MRN: [id number]; DOB: [birth year]  Admit date: 03/13/2022 Date of Consult: 03/13/2022  PCP:  Kathyrn Lass   [place] Providers Cardiologist:  Larae Grooms, MD  Electrophysiologist:  Cristopher Peru, MD       Patient Profile:   Lindsay Camacho is a 57 y.o. female with a hx of ischemic cardiomyopathy, CHF with recovered LVEF (most recent EF 50-55 %), CHF, mitral valve repair with annuloplasty 2018, PAD status post left subclavian stent September 2023, CRT-D (Medtronic) who is being seen 03/13/2022 for the evaluation of tachycardia at the request of Dr. Regenia Skeeter.  History of Present Illness:   Ms. Lindsay Camacho has been feeling poorly for roughly a week due to palpitations, lightheaded for the last 2 days, but became acutely short of breath and developed mild chest discomfort around 4 PM this afternoon when her heart rate increased abruptly to 150 bpm.  She has not had symptoms similar to her previous angina pectoris.  On arrival to the ED she was found to have wide-complex tachycardia at about 147 bpm with a QRS morphology very similar to her chronic left bundle branch block.  She denies recent fever, chills, edema, orthopnea, PND, focal neurological events, full syncope.  Her heart has been racing but it seems regular.  She is not on chronic anticoagulation but she takes aspirin and clopidogrel.  Not had any recent falls or bleeding problems.  She has had transient headaches.  She was seen in the emergency room on March 3 with these complaints and no abnormalities were identified on ICD interrogation.  Interrogation of her ICD shows abrupt onset of AV node reentry tachycardia around 4 PM, ongoing events since then.  There is superposition of the atrial and ventricular electrograms.  The episode onset correlates with a brief burst of premature atrial contractions, after which the tachycardia becomes self maintained.  Medtronic CRT-D  function is otherwise normal.  She has had over 98% biventricular pacing.  Estimated generator longevity is 20 months.  All lead parameters appear normal.  She has not had any episodes of high ventricular rate (monitor zone starts at 170 bpm).  She has never received defibrillator discharges.  OptiVol is not out of normal range.  Brief overdrive atrial pacing at around 175 bpm lead to immediate termination of the reentry tachycardia.  She is now back in atrial sensed (sinus), biventricular paced rhythm at about 76 bpm.  She felt almost immediate relief of her symptoms.   Past Medical History:  Diagnosis Date   Acute myocardial infarction of other lateral wall, initial episode of care 01/2013   DES CFX   Aortoiliac occlusive disease ([place]) 08/22/2016   CAD (coronary artery disease) 11/13/2011   50% ? proximal LCx stenosis per Cath at [facility]   Chest pain, atypical, muscular skelatal   01/09/2014   COVID 02/08/2020   Dyspnea    Heart failure (HCC)    HLD (hyperlipidemia)    Hypertension    Intermediate coronary syndrome (HCC)    LBBB (left bundle branch block)    Mitral regurgitation    Nonischemic cardiomyopathy ([place]) 01/26/2017   Presence of combination internal cardiac defibrillator (ICD) and pacemaker    PVD (peripheral vascular disease) ([place])    Right CFA stenosis per report on 11/14/2011   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/25/2016   [place] McArthy-Adams IMR ETLogix ring annuloplasty (Model 4100, Serial [id number], size 26) placed via right mini thoracotomy approach  Stenosis of left subclavian artery ([place]) 08/22/2016   Stroke ([place])    tia with no deficits    Past Surgical History:  Procedure Laterality Date   AORTIC ARCH ANGIOGRAPHY N/A 09/21/2021   Procedure: AORTIC ARCH ANGIOGRAPHY;  Surgeon: Wellington Hampshire, MD;  Location: [place] [facility];  Service: Cardiovascular;  Laterality: N/A;   APPENDECTOMY     BIV ICD INSERTION CRT-D N/A 01/24/2017   Procedure: BIV ICD  INSERTION CRT-D;  Surgeon: Evans Lance, MD;  Location: [place] [facility];  Service: Cardiovascular;  Laterality: N/A;   CARDIAC CATHETERIZATION  04/14/2013   Non-obstructive disease, patent CFX stent   CARDIAC CATHETERIZATION N/A 09/21/2014   Procedure: Left Heart Cath and Coronary Angiography;  Surgeon: Troy Sine, MD;  Location: [place] [facility];  Service: Cardiovascular;  Laterality: N/A;   CORONARY ANGIOPLASTY WITH STENT PLACEMENT  01/17/2013   mild disease except 99% CFX, rx with  2.5 x 28 Alpine drug-eluting stent    GROIN DEBRIDEMENT Right 04/14/2013   Procedure: Emergency Evacuation of Retroperitoneal Hematoma and Repair Right External Iliac Artery Pseudoaneurysm    ;  Surgeon: Elam Dutch, MD;  Location: [place];  Service: Vascular;  Laterality: Right;   LEFT HEART CATHETERIZATION WITH CORONARY ANGIOGRAM N/A 01/18/2013   Procedure: LEFT HEART CATHETERIZATION WITH CORONARY ANGIOGRAM;  Surgeon: Jettie Booze, MD;  Location: [facility] CATH LAB;  Service: Cardiovascular;  Laterality: N/A;   LEFT HEART CATHETERIZATION WITH CORONARY ANGIOGRAM N/A 04/14/2013   Procedure: LEFT HEART CATHETERIZATION WITH CORONARY ANGIOGRAM;  Surgeon: Jettie Booze, MD;  Location: [facility] CATH LAB;  Service: Cardiovascular;  Laterality: N/A;   MITRAL VALVE REPAIR Right 08/25/2016   Procedure: MINIMALLY INVASIVE MITRAL VALVE REPAIR;  Surgeon: Rexene Alberts, MD;  Location: [place];  Service: Open Heart Surgery;  Laterality: Right;   MULTIPLE EXTRACTIONS WITH ALVEOLOPLASTY N/A 08/23/2016   Procedure: Extraction of tooth #'s 17, 20-23, 26-29 with alveoloplasty;  Surgeon: Lenn Cal, DDS;  Location: [place];  Service: Oral Surgery;  Laterality: N/A;   PERCUTANEOUS CORONARY STENT INTERVENTION (PCI-S)  01/18/2013   Procedure: PERCUTANEOUS CORONARY STENT INTERVENTION (PCI-S);  Surgeon: Jettie Booze, MD;  [facility] C[facility] LAB;  Service: Cardiovascular;;   RIGHT/LEFT HEART CATH AND CORONARY ANGIOGRAPHY  N/A 08/21/2016   Procedure: RIGHT/LEFT HEART CATH AND CORONARY ANGIOGRAPHY;  Surgeon: Burnell Blanks, MD;  Location: [place] [facility];  Service: Cardiovascular;  Laterality: N/A;   TEE WITHOUT CARDIOVERSION N/A 03/06/2014   Procedure: TRANSESOPHAGEAL ECHOCARDIOGRAM (TEE);  Surgeon: Dorothy Spark, MD;  Location: [place];  Service: Cardiovascular;  Laterality: N/A;   TEE WITHOUT CARDIOVERSION N/A 08/18/2016   Procedure: TRANSESOPHAGEAL ECHOCARDIOGRAM (TEE);  Surgeon: Lelon Perla, MD;  Location: [facility] ENDOSCOPY;  Service: Cardiovascular;  Laterality: N/A;   TEE WITHOUT CARDIOVERSION N/A 08/25/2016   Procedure: TRANSESOPHAGEAL ECHOCARDIOGRAM (TEE);  Surgeon: Rexene Alberts, MD;  Location: [place];  Service: Open Heart Surgery;  Laterality: N/A;   TUBAL LIGATION     UPPER EXTREMITY INTERVENTION  09/21/2021   Procedure: UPPER EXTREMITY INTERVENTION;  Surgeon: Wellington Hampshire, MD;  Location: [place] [facility];  Service: Cardiovascular;;       Inpatient Medications: Scheduled Meds:  Continuous Infusions:  PRN Meds:   Allergies:    Allergies  Allergen Reactions   Aldomet [Methyldopa] Other (See Comments)    Severe hypotension   Brilinta [Ticagrelor] Shortness Of Breath   Other Other (See Comments)    Nuclear  Stress Test Medication caused seizures   Coumadin [Warfarin Sodium] Swelling    Arm swelling   Desyrel [Trazodone] Other (See Comments)    Could not wake up from medication, comatosed   Eliquis [Apixaban] Other (See Comments)    BP got too low   Entresto [Sacubitril-Valsartan] Hypertension    Hypotension    Zestril [Lisinopril]     Significant blood pressure fluctuations    Penicillins Rash    Has patient had a PCN reaction causing immediate rash, facial/tongue/throat swelling, SOB or lightheadedness with hypotension: Yes Has patient had a PCN reaction causing severe rash involving mucus membranes or skin necrosis: No Has patient had a PCN reaction that  required hospitalization: No Has patient had a PCN reaction occurring within the last 10 years: No If all of the above answers are "NO", then may proceed with Cephalosporin use.     Social History:   Social History   Socioeconomic History   Marital status: Single    Spouse name: Not on file   Number of children: Not on file   Years of education: Not on file   Highest education level: Not on file  Occupational History   Not on file  Tobacco Use   Smoking status: Every Day    Packs/day: 0.50    Types: Cigarettes   Smokeless tobacco: Never  Vaping Use   Vaping Use: Never used  Substance and Sexual Activity   Alcohol use: No    Alcohol/week: 0.0 standard drinks of alcohol   Drug use: Not Currently    Types: Marijuana   Sexual activity: Yes    Birth control/protection: None  Other Topics Concern   Not on file  Social History Narrative   Not on file   Social Determinants of Health   Financial Resource Strain: Not on file  Food Insecurity: Not on file  Transportation Needs: Not on file  Physical Activity: Not on file  Stress: Not on file  Social Connections: Not on file  Intimate Partner Violence: Not on file    Family History:    Family History  Problem Relation Age of Onset   CAD Mother 67   Lung cancer Mother    Bladder Cancer Mother    Stroke Mother    Heart disease Mother        Before age 34   Hypertension Mother    Heart attack Mother    CAD Father 74   Heart disease Father        Before age 62   Heart attack Father    Stroke Father        Bleeding stroke     ROS:  Please see the history of present illness.   All other ROS reviewed and negative.     Physical Exam/Data:   Vitals:   03/13/22 1839  BP: 122/75  Pulse: (!) 143  Resp: 20  SpO2: 99%   No intake or output data in the 24 hours ending 03/13/22 1924    03/05/2022   11:21 AM 02/07/2022   11:31 AM 10/20/2021    4:04 PM  Last 3 Weights  Weight (lbs) 103 lb 103 lb 101 lb 9.6 oz   Weight (kg) 46.72 kg 46.72 kg 46.085 kg     There is no height or weight on file to calculate BMI.  General:  Well nourished, well developed, in no acute distress HEENT: Edentulous, otherwise normal.  She has scars in both subclavian areas.  The BiV ICD is  located in the left subclavian area and appears healthy. Neck: no JVD Vascular: No carotid bruits; Distal pulses 2+ bilaterally Cardiac:  normal S1, S2; RRR; no murmur  Lungs:  clear to auscultation bilaterally, no wheezing, rhonchi or rales  Abd: soft, nontender, no hepatomegaly  Ext: no edema Musculoskeletal:  No deformities, BUE and BLE strength normal and equal Skin: warm and dry  Neuro:  CNs 2-12 intact, no focal abnormalities noted Psych:  Normal affect   EKG:  The EKG was personally reviewed and demonstrates: Wide-complex tachycardia with QRS morphology consistent with pre-existing left bundle branch block and a suggestion of a retrograde P wave on the terminal QRS Telemetry:  Telemetry was personally reviewed and demonstrates: Regular wide-complex tachycardia at 147 bpm.  After overdrive pacing the rhythm is atrial sensed (sinus), biventricular paced.  Relevant CV Studies: 09/01/2021    1. Left ventricular ejection fraction, by estimation, is 50 to 55%. Left  ventricular ejection fraction by 3D volume is 50 %. The left ventricle has  low normal function. The left ventricle has no regional wall motion  abnormalities. There is mild left  ventricular hypertrophy. Left ventricular diastolic function could not be  evaluated.   2. Right ventricular systolic function is low normal. The right  ventricular size is normal. There is normal pulmonary artery systolic  pressure. The estimated right ventricular systolic pressure is [id number] mmHg.   3. Left atrial size was moderately dilated.   4. The mitral valve has been repaired/replaced. Trivial mitral valve  regurgitation. No evidence of mitral stenosis. The mean mitral valve   gradient is 4.0 mmHg with average heart rate of 61 bpm. There is a  prosthetic annuloplasty ring present in the  mitral position. Procedure Date: 08/25/2016.   5. The tricuspid valve is abnormal. Tricuspid valve regurgitation is  moderate.   6. The aortic valve is tricuspid. Aortic valve regurgitation is mild to  moderate.   7. The inferior vena cava is normal in size with <50% respiratory  variability, suggesting right atrial pressure of 8 mmHg.   Comparison(s): Changes from prior study are noted. 03/31/2020: LVEF 50-55%,  MV annuloplaty ring- mean gradient 2 mmHg.    Laboratory Data:  High Sensitivity Troponin:   Recent Labs  Lab 03/05/22 1136 03/05/22 1326  TROPONINIHS 11 15     ChemistryNo results for input(s): "NA", "K", "CL", "CO2", "GLUCOSE", "BUN", "CREATININE", "CALCIUM", "MG", "GFRNONAA", "GFRAA", "ANIONGAP" in the last 168 hours.  No results for input(s): "PROT", "ALBUMIN", "AST", "ALT", "ALKPHOS", "BILITOT" in the last 168 hours. Lipids No results for input(s): "CHOL", "TRIG", "HDL", "LABVLDL", "LDLCALC", "CHOLHDL" in the last 168 hours.  Hematology Recent Labs  Lab 03/13/22 1728  WBC 9.1  RBC 5.89*  HGB 17.4*  HCT 51.8*  MCV 87.9  MCH 29.5  MCHC 33.6  RDW 13.7  PLT 213   Thyroid No results for input(s): "TSH", "FREET4" in the last 168 hours.  BNPNo results for input(s): "BNP", "PROBNP" in the last 168 hours.  DDimer No results for input(s): "DDIMER" in the last 168 hours.   Radiology/Studies:  CT Head Wo Contrast  Result Date: 03/13/2022 CLINICAL DATA:  57 year old female with acute neuro deficit EXAM: CT HEAD WITHOUT CONTRAST TECHNIQUE: Contiguous axial images were obtained from the base of the skull through the vertex without intravenous contrast. RADIATION DOSE REDUCTION: This exam was performed according to the departmental dose-optimization program which includes automated exposure control, adjustment of the mA and/or kV according to patient size  and/or use  of iterative reconstruction technique. COMPARISON:  08/14/2021 CT and prior studies FINDINGS: Brain: A new 8 mm hypodensity within the white matter adjacent to the LEFT basal ganglia is compatible with age indeterminate infarct. There is no evidence of hemorrhage. There is no evidence of mass lesion or mass effect, hydrocephalus, extra-axial collection or other infarct. Mild atrophy noted. Vascular: Carotid atherosclerotic calcifications are noted. Skull: Normal. Negative for fracture or focal lesion. Sinuses/Orbits: No acute finding. Other: None. IMPRESSION: 1. 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 Consider MRI for further evaluation as clinically indicated. 2. Mild atrophy. Electronically Signed   By: Margarette Canada M.D.   On: 03/13/2022 18:56   DG Chest Port 1 View  Result Date: 03/13/2022 CLINICAL DATA:  Tachycardia, chest pain, short of breath EXAM: PORTABLE CHEST 1 VIEW COMPARISON:  03/05/2022 FINDINGS: Single frontal view of the chest demonstrates multi lead pacer/AICD unchanged. Postsurgical changes of the mitral valve. Stable mild cardiomegaly. No acute airspace disease, effusion, or pneumothorax. Stable stent in the left subclavian distribution. No acute bony abnormality. IMPRESSION: 1. Stable chest, no acute process. Electronically Signed   By: Randa Ngo M.D.   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  As far as I can tell, this is the first time she has had this arrhythmia since device implantation years ago.  Labs are still pending.  Will need to make sure she does not have hypokalemia or other abnormalities that could precipitate arrhythmia, but otherwise I do not think there is an indication for additional treatment at this time.  If she has recurrent episodes of symptomatic AVNRT, rather than enhanced medical therapy, she  would be an excellent candidate for AV node ablation since she already has a cardiac rhythm device in place. CHF: Appears clinically euvolemic.  Marked improvement in LVEF from a low of 20-25 % to almost normal following medical therapy and CRT.  On carvedilol, ARB, spironolactone.  Blood pressure will not tolerate Entresto. CAD: Currently asymptomatic. S/P MVR: Normal mitral gradients and mild residual regurgitation by previous echocardiogram in August 2023 CRT-D: Unsuccessful placement of a coronary sinus lead, but she has RV and His bundle leads with a good clinical and echo response.  Device function is normal and she has had excellent biventricular pacing.  Was able to stop the AV node reentry with overdrive pacing via the atrial lead today.   Risk Assessment/Risk Scores:            [facility] will sign off.   Medication Recommendations: No change in medications. Other recommendations (labs, testing, etc): Correct electrolyte abnormalities if necessary. Follow up as an outpatient: Will make arrangements for an early follow-up with Dr. Irish Lack or Dr. Lovena Le.  For questions or updates, please contact [place] Please consult [email or web address] for contact info under    Signed, Sanda Klein, MD  03/13/2022 7:24 PM

## 2022-03-14 ENCOUNTER — Observation Stay (HOSPITAL_COMMUNITY): Payer: Medicare HMO

## 2022-03-14 ENCOUNTER — Other Ambulatory Visit: Payer: Self-pay

## 2022-03-14 DIAGNOSIS — R4781 Slurred speech: Secondary | ICD-10-CM

## 2022-03-14 LAB — CBC
HCT: 46.9 % — ABNORMAL HIGH (ref 36.0–46.0)
Hemoglobin: 15.4 g/dL — ABNORMAL HIGH (ref 12.0–15.0)
MCH: 29.3 pg (ref 26.0–34.0)
MCHC: 32.8 g/dL (ref 30.0–36.0)
MCV: 89.2 fL (ref 80.0–100.0)
Platelets: 169 10*3/uL (ref 150–400)
RBC: 5.26 MIL/uL — ABNORMAL HIGH (ref 3.87–5.11)
RDW: 13.8 % (ref 11.5–15.5)
WBC: 6 10*3/uL (ref 4.0–10.5)
nRBC: 0 % (ref 0.0–0.2)

## 2022-03-14 LAB — BASIC METABOLIC PANEL
Anion gap: 6 (ref 5–15)
BUN: 13 mg/dL (ref 6–20)
CO2: 24 mmol/L (ref 22–32)
Calcium: 8.9 mg/dL (ref 8.9–10.3)
Chloride: 108 mmol/L (ref 98–111)
Creatinine, Ser: 0.89 mg/dL (ref 0.44–1.00)
GFR, Estimated: >60 mL/min (ref >60)
Glucose, Bld: 92 mg/dL (ref 70–99)
Potassium: 3.9 mmol/L (ref 3.5–5.1)
Sodium: 138 mmol/L (ref 135–145)

## 2022-03-14 LAB — LIPID PANEL
Cholesterol: 163 mg/dL (ref 0–200)
HDL: 34 mg/dL — ABNORMAL LOW (ref >40)
LDL Cholesterol: 112 mg/dL — ABNORMAL HIGH (ref 0–99)
Total CHOL/HDL Ratio: 4.8 RATIO
Triglycerides: 87 mg/dL (ref <150)
VLDL: 17 mg/dL (ref 0–40)

## 2022-03-14 LAB — PHOSPHORUS: Phosphorus: 3.6 mg/dL (ref 2.5–4.6)

## 2022-03-14 LAB — MAGNESIUM: Magnesium: 1.7 mg/dL (ref 1.7–2.4)

## 2022-03-14 MED ORDER — POLYETHYLENE GLYCOL 3350 17 G PO PACK: 17.0000 g | PACK | Freq: Every day | ORAL | Status: DC | PRN

## 2022-03-14 MED ORDER — LACTATED RINGERS IV SOLN: INTRAVENOUS | Status: DC

## 2022-03-14 MED ORDER — SPIRONOLACTONE 12.5 MG HALF TABLET
12.5000 mg | ORAL_TABLET | Freq: Every day | ORAL | Status: DC
  Administered 2022-03-14 – 2022-03-16 (×3): 12.5 mg via ORAL
  Filled 2022-03-14 (×3): qty 1

## 2022-03-14 MED ORDER — CLOPIDOGREL BISULFATE 75 MG PO TABS
75.0000 mg | ORAL_TABLET | Freq: Every day | ORAL | Status: DC
  Administered 2022-03-14 – 2022-03-16 (×3): 75 mg via ORAL
  Filled 2022-03-14 (×3): qty 1

## 2022-03-14 MED ORDER — PROCHLORPERAZINE EDISYLATE 10 MG/2ML IJ SOLN: 5.0000 mg | Freq: Four times a day (QID) | INTRAMUSCULAR | Status: DC | PRN

## 2022-03-14 MED ORDER — ACETAMINOPHEN 325 MG PO TABS: 650.0000 mg | ORAL_TABLET | Freq: Four times a day (QID) | ORAL | Status: DC | PRN

## 2022-03-14 MED ORDER — ASPIRIN 81 MG PO TBEC
81.0000 mg | DELAYED_RELEASE_TABLET | Freq: Every day | ORAL | Status: DC
  Administered 2022-03-14 – 2022-03-16 (×3): 81 mg via ORAL
  Filled 2022-03-14 (×3): qty 1

## 2022-03-14 MED ORDER — ENOXAPARIN SODIUM 40 MG/0.4ML IJ SOSY
40.0000 mg | PREFILLED_SYRINGE | Freq: Every day | INTRAMUSCULAR | Status: DC
  Administered 2022-03-14 – 2022-03-16 (×3): 40 mg via SUBCUTANEOUS
  Filled 2022-03-14 (×3): qty 0.4

## 2022-03-14 MED ORDER — ATORVASTATIN CALCIUM 80 MG PO TABS
80.0000 mg | ORAL_TABLET | Freq: Every day | ORAL | Status: DC
  Administered 2022-03-14 – 2022-03-16 (×3): 80 mg via ORAL
  Filled 2022-03-14 (×2): qty 1
  Filled 2022-03-14: qty 2

## 2022-03-14 MED ORDER — MELATONIN 5 MG PO TABS: 5.0000 mg | ORAL_TABLET | Freq: Every evening | ORAL | Status: DC | PRN

## 2022-03-14 MED ORDER — PANTOPRAZOLE SODIUM 40 MG PO TBEC
40.0000 mg | DELAYED_RELEASE_TABLET | Freq: Every day | ORAL | Status: DC
  Administered 2022-03-14 – 2022-03-16 (×3): 40 mg via ORAL
  Filled 2022-03-14 (×3): qty 1

## 2022-03-14 NOTE — Progress Notes (Addendum)
PROGRESS NOTE    Lindsay Camacho  [id number] DOB: 1965/03/27 DOA: 03/13/2022 PCP: Merryl Hacker, No   Brief Narrative:  Lindsay Camacho is a 57 y.o. female with medical history significant for coronary artery disease, cardiomyopathy, status post cardiac defibrillator and pacemaker, ongoing tobacco abuse, who presented to the ED with complaints of palpitations and chest pain, and slurred speech for several days.  Hospitalist admitted for stroke workup, neurology consulted.  Assessment & Plan:   Principal Problem:   Slurred speech Active Problems:   AVNRT (AV nodal re-entry tachycardia)  Resolved slurred speech, acute CVA Update: MRI confirms acute CVA: '8 mm acute infarction in the left corona radiata' MRI delayed in the setting of pacemaker Frequent neuro-checks. Fasting lipid panel (minimally low HDL at 34; LDL 112) PT OT evaluation pending SLP recommending regular diet and thin liquids  Symptomatic AV nodal reentry tachycardia Seen by cardiology, status post override pacing by Dr. Sallyanne Kuster. Rate is currently controlled post overdrive pacing. Continue to monitor on telemetry.   Cardiology signed off.  No changes in cardiac medications.    Coronary artery disease Denies any anginal symptoms. Resume home aspirin, Plavix and Lipitor   GERD Resume home Protonix.   HFpEF last 2D echo done on 09/01/2021 showed LVEF 50 to 55% Start strict I's and O's and daily weight.  DVT prophylaxis: lovenox Code Status: Full Family Communication: None present  Status is: Observation  Dispo: The patient is from: Home              Anticipated d/c is to: Home              Anticipated d/c date is: 24 to 48 hours              Patient currently not medically stable for discharge, awaiting MRI formal evaluation for stroke rule out.  Consultants:  Neurology, cardiology  Procedures:  None  Antimicrobials:  None  Subjective: No acute issues or events overnight, dysarthria resolved.  Denies nausea  vomiting diarrhea constipation headache fevers chills or chest pain.  Objective: Vitals:   03/14/22 0515 03/14/22 0545 03/14/22 0700 03/14/22 0715  BP: 128/73 (!) 141/66 (!) 159/82 (!) 159/74  Pulse: 61 63 (!) 58 (!) 59  Resp: '18 17 19 18  '$ Temp:      TempSrc:      SpO2: 99% 98% 98% 94%   No intake or output data in the 24 hours ending 03/14/22 0757 There were no vitals filed for this visit.  Examination:  General:  Pleasantly resting in bed, No acute distress. HEENT:  Normocephalic atraumatic.  Sclerae nonicteric, noninjected.  Extraocular movements intact bilaterally. Neck:  Without mass or deformity.  Trachea is midline. Lungs:  Clear to auscultate bilaterally without rhonchi, wheeze, or rales. Heart:  Regular rate and rhythm.  Without murmurs, rubs, or gallops. Abdomen:  Soft, nontender, nondistended.  Without guarding or rebound. Extremities: Without cyanosis, clubbing, edema, or obvious deformity. Vascular:  Dorsalis pedis and posterior tibial pulses palpable bilaterally. Skin:  Warm and dry, no erythema, no ulcerations.  Data Reviewed: I have personally reviewed following labs and imaging studies  CBC: Recent Labs  Lab 03/13/22 1728 03/14/22 0251  WBC 9.1 6.0  NEUTROABS 5.4  --   HGB 17.4* 15.4*  HCT 51.8* 46.9*  MCV 87.9 89.2  PLT 213 [id number]   Basic Metabolic Panel: Recent Labs  Lab 03/13/22 1728 03/14/22 0251  NA 138 138  K 4.4 3.9  CL 105 108  CO2 20*  24  GLUCOSE 95 92  BUN 15 13  CREATININE 0.97 0.89  CALCIUM 9.7 8.9  MG 1.8 1.7  PHOS  --  3.6   Lipid Profile: Recent Labs    03/14/22 0528  CHOL 163  HDL 34*  LDLCALC 112*  TRIG 87  CHOLHDL 4.8   No results found for this or any previous visit (from the past 240 hour(s)).   Radiology Studies: CT Head Wo Contrast  Result Date: 03/13/2022 CLINICAL DATA:  57 year old female with acute neuro deficit EXAM: CT HEAD WITHOUT CONTRAST TECHNIQUE: Contiguous axial images were obtained from the base of  the skull through the vertex without intravenous contrast. RADIATION DOSE REDUCTION: This exam was performed according to the departmental dose-optimization program which includes automated exposure control, adjustment of the mA and/or kV according to patient size and/or use of iterative reconstruction technique. COMPARISON:  08/14/2021 CT and prior studies FINDINGS: Brain: A new 8 mm hypodensity within the white matter adjacent to the LEFT basal ganglia is compatible with age indeterminate infarct. There is no evidence of hemorrhage. There is no evidence of mass lesion or mass effect, hydrocephalus, extra-axial collection or other infarct. Mild atrophy noted. Vascular: Carotid atherosclerotic calcifications are noted. Skull: Normal. Negative for fracture or focal lesion. Sinuses/Orbits: No acute finding. Other: None. IMPRESSION: 1. 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 Consider MRI for further evaluation as clinically indicated. 2. Mild atrophy. Electronically Signed   By: Margarette Canada M.D.   On: 03/13/2022 18:56   DG Chest Port 1 View  Result Date: 03/13/2022 CLINICAL DATA:  Tachycardia, chest pain, short of breath EXAM: PORTABLE CHEST 1 VIEW COMPARISON:  03/05/2022 FINDINGS: Single frontal view of the chest demonstrates multi lead pacer/AICD unchanged. Postsurgical changes of the mitral valve. Stable mild cardiomegaly. No acute airspace disease, effusion, or pneumothorax. Stable stent in the left subclavian distribution. No acute bony abnormality. IMPRESSION: 1. Stable chest, no acute process. Electronically Signed   By: Randa Ngo M.D.   On: 03/13/2022 18:38    Scheduled Meds:  aspirin EC  81 mg Oral Daily   atorvastatin  80 mg Oral Daily   clopidogrel  75 mg Oral Daily   enoxaparin (LOVENOX) injection  40 mg Subcutaneous Daily   pantoprazole  40 mg Oral Daily   spironolactone  12.5 mg Oral Daily   Continuous Infusions:  lactated ringers 50 mL/hr at 03/14/22 0312      LOS: 0 days   Time spent: 50mn  Breann Losano C Shaunae Sieloff, DO [facility]  If 7PM-7AM, please contact night-coverage [email or web address]  03/14/2022, 7:57 AM

## 2022-03-14 NOTE — Progress Notes (Signed)
PT Cancellation Note  Patient Details Name: Lindsay Camacho MRN: [id number] DOB: [birth year]   Cancelled Treatment:    Reason Eval/Treat Not Completed: Patient at procedure or test/unavailable. Pt off unit at MRI, PT will attempt to follow up as time allows.   Zenaida Niece 03/14/2022, 1:41 PM

## 2022-03-14 NOTE — Evaluation (Signed)
Clinical/Bedside Swallow Evaluation Patient Details  Name: MARSHAI OKUNO MRN: [id number] Date of Birth: [birth year]  Today's Date: 03/14/2022 Time: SLP Start Time (ACUTE ONLY): 0844 SLP Stop Time (ACUTE ONLY): 0852 SLP Time Calculation (min) (ACUTE ONLY): 8 min  Past Medical History:  Past Medical History:  Diagnosis Date   Acute myocardial infarction of other lateral wall, initial episode of care 01/2013   DES CFX   Aortoiliac occlusive disease ([place]) 08/22/2016   CAD (coronary artery disease) 11/13/2011   50% ? proximal LCx stenosis per Cath at [facility]   Chest pain, atypical, muscular skelatal   01/09/2014   COVID 02/08/2020   Dyspnea    Heart failure (HCC)    HLD (hyperlipidemia)    Hypertension    Intermediate coronary syndrome (HCC)    LBBB (left bundle branch block)    Mitral regurgitation    Nonischemic cardiomyopathy ([place]) 01/26/2017   Presence of combination internal cardiac defibrillator (ICD) and pacemaker    PVD (peripheral vascular disease) ([place])    Right CFA stenosis per report on 11/14/2011   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/25/2016   Edwards McArthy-Adams IMR ETLogix ring annuloplasty (Model 4100, Serial [id number], size 26) placed via right mini thoracotomy approach   Stenosis of left subclavian artery ([place]) 08/22/2016   Stroke ([place])    tia with no deficits   Past Surgical History:  Past Surgical History:  Procedure Laterality Date   AORTIC ARCH ANGIOGRAPHY N/A 09/21/2021   Procedure: AORTIC ARCH ANGIOGRAPHY;  Surgeon: Wellington Hampshire, MD;  Location: [place] CV LAB;  Service: Cardiovascular;  Laterality: N/A;   APPENDECTOMY     BIV ICD INSERTION CRT-D N/A 01/24/2017   Procedure: BIV ICD INSERTION CRT-D;  Surgeon: Evans Lance, MD;  Location: [place] CV LAB;  Service: Cardiovascular;  Laterality: N/A;   CARDIAC CATHETERIZATION  04/14/2013   Non-obstructive disease, patent CFX stent   CARDIAC CATHETERIZATION N/A 09/21/2014   Procedure: Left  Heart Cath and Coronary Angiography;  Surgeon: Troy Sine, MD;  Location: [place] CV LAB;  Service: Cardiovascular;  Laterality: N/A;   CORONARY ANGIOPLASTY WITH STENT PLACEMENT  01/17/2013   mild disease except 99% CFX, rx with  2.5 x 28 Alpine drug-eluting stent    GROIN DEBRIDEMENT Right 04/14/2013   Procedure: Emergency Evacuation of Retroperitoneal Hematoma and Repair Right External Iliac Artery Pseudoaneurysm    ;  Surgeon: Elam Dutch, MD;  Location: [place];  Service: Vascular;  Laterality: Right;   LEFT HEART CATHETERIZATION WITH CORONARY ANGIOGRAM N/A 01/18/2013   Procedure: LEFT HEART CATHETERIZATION WITH CORONARY ANGIOGRAM;  Surgeon: Jettie Booze, MD;  Location: [facility] CATH LAB;  Service: Cardiovascular;  Laterality: N/A;   LEFT HEART CATHETERIZATION WITH CORONARY ANGIOGRAM N/A 04/14/2013   Procedure: LEFT HEART CATHETERIZATION WITH CORONARY ANGIOGRAM;  Surgeon: Jettie Booze, MD;  Location: [facility] CATH LAB;  Service: Cardiovascular;  Laterality: N/A;   MITRAL VALVE REPAIR Right 08/25/2016   Procedure: MINIMALLY INVASIVE MITRAL VALVE REPAIR;  Surgeon: Rexene Alberts, MD;  Location: [place];  Service: Open Heart Surgery;  Laterality: Right;   MULTIPLE EXTRACTIONS WITH ALVEOLOPLASTY N/A 08/23/2016   Procedure: Extraction of tooth #'s 17, 20-23, 26-29 with alveoloplasty;  Surgeon: Lenn Cal, DDS;  Location: [place];  Service: Oral Surgery;  Laterality: N/A;   PERCUTANEOUS CORONARY STENT INTERVENTION (PCI-S)  01/18/2013   Procedure: PERCUTANEOUS CORONARY STENT INTERVENTION (PCI-S);  Surgeon: Jettie Booze, MD;  Location: [facility] CATH LAB;  Service:  Cardiovascular;;   RIGHT/LEFT HEART CATH AND CORONARY ANGIOGRAPHY N/A 08/21/2016   Procedure: RIGHT/LEFT HEART CATH AND CORONARY ANGIOGRAPHY;  Surgeon: Burnell Blanks, MD;  Location: [place] CV LAB;  Service: Cardiovascular;  Laterality: N/A;   TEE WITHOUT CARDIOVERSION N/A 03/06/2014   Procedure: TRANSESOPHAGEAL  ECHOCARDIOGRAM (TEE);  Surgeon: Dorothy Spark, MD;  Location: [place];  Service: Cardiovascular;  Laterality: N/A;   TEE WITHOUT CARDIOVERSION N/A 08/18/2016   Procedure: TRANSESOPHAGEAL ECHOCARDIOGRAM (TEE);  Surgeon: Lelon Perla, MD;  Location: [facility] ENDOSCOPY;  Service: Cardiovascular;  Laterality: N/A;   TEE WITHOUT CARDIOVERSION N/A 08/25/2016   Procedure: TRANSESOPHAGEAL ECHOCARDIOGRAM (TEE);  Surgeon: Rexene Alberts, MD;  Location: [place];  Service: Open Heart Surgery;  Laterality: N/A;   TUBAL LIGATION     UPPER EXTREMITY INTERVENTION  09/21/2021   Procedure: UPPER EXTREMITY INTERVENTION;  Surgeon: Wellington Hampshire, MD;  Location: [place] CV LAB;  Service: Cardiovascular;;   HPI:  TABIATHA TIMBS is a 57 y.o. female with medical history significant for coronary artery disease, cardiomyopathy, status post cardiac defibrillator and pacemaker, ongoing tobacco abuse, who presented to the ED with complaints of palpitations and chest pain, and slurred speech for several days. CT age-indeterminate left white matter infarct without hemorrhage. MRI pending.    Assessment / Plan / Recommendation  Clinical Impression  Pt alert, following commands to demonstrate intact oromotor movments and strength. Pt denies prior or current dysphagia. She is endentulous and has been for many years without dentures. She was able to masticate solid texture timely and thoroughly without residue. Sequential sips thin liquid via straw appeared timely without signs of aspiration throughout evaluation. Recommend continue regular texture, thin liquids, pills with water in upright position. No further ST is needed. SLP Visit Diagnosis: Dysphagia, unspecified (R13.10)    Aspiration Risk  No limitations    Diet Recommendation Regular;Thin liquid   Liquid Administration via: Cup;Straw Medication Administration: Whole meds with liquid Supervision: Patient able to self feed Postural Changes: Seated upright at  90 degrees    Other  Recommendations Oral Care Recommendations: Oral care BID    Recommendations for follow up therapy are one component of a multi-disciplinary discharge planning process, led by the attending physician.  Recommendations may be updated based on patient status, additional functional criteria and insurance authorization.  Follow up Recommendations No SLP follow up      Assistance Recommended at Discharge    Functional Status Assessment Patient has not had a recent decline in their functional status  Frequency and Duration            Prognosis        Swallow Study   General Date of Onset: 03/13/22 HPI: KALAYA PETRIDES is a 57 y.o. female with medical history significant for coronary artery disease, cardiomyopathy, status post cardiac defibrillator and pacemaker, ongoing tobacco abuse, who presented to the ED with complaints of palpitations and chest pain, and slurred speech for several days. CT age-indeterminate left white matter infarct without hemorrhage. MRI pending. Type of Study: Bedside Swallow Evaluation Previous Swallow Assessment:  (none) Diet Prior to this Study: Regular;Thin liquids (Level 0) Temperature Spikes Noted: No Respiratory Status: Room air History of Recent Intubation: No Behavior/Cognition: Alert;Cooperative;Pleasant mood Oral Cavity Assessment: Within Functional Limits Oral Care Completed by SLP: No Oral Cavity - Dentition: Edentulous (no dentures) Vision: Functional for self-feeding Self-Feeding Abilities: Able to feed self Patient Positioning: Upright in bed Baseline Vocal Quality: Normal Volitional Cough: Strong Volitional Swallow: Able  to elicit    Oral/Motor/Sensory Function Overall Oral Motor/Sensory Function: Within functional limits   Ice Chips Ice chips: Not tested   Thin Liquid Thin Liquid: Within functional limits Presentation: Straw    Nectar Thick Nectar Thick Liquid: Not tested   Honey Thick Honey Thick Liquid: Not tested    Puree Puree: Within functional limits   Solid     Solid: Within functional limits      Houston Siren 03/14/2022,9:02 AM

## 2022-03-14 NOTE — Plan of Care (Signed)

## 2022-03-14 NOTE — H&P (Signed)
History and Physical  Lindsay Camacho [id number] DOB: Jan 22, 1965 DOA: 03/13/2022  Referring physician: Dr. Regenia Skeeter, [place]  PCP: Pcp, No  Outpatient Specialists: Cardiology. Patient coming from: Home.  Chief Complaint: Palpitations, slurred speech.  HPI: Lindsay Camacho is a 57 y.o. female with medical history significant for coronary artery disease, cardiomyopathy, status post cardiac defibrillator and pacemaker, ongoing tobacco abuse, who presented to the ED with complaints of palpitations and chest pain, and slurred speech for several days.  In the ED, seen by cardiology, Dr. Cleda Mccreedy override her pacemaker with improvement of her palpitations.  Due to her slurred speech Neurology was consulted.  Noncontrast CT head showed age-indeterminate infarct.  Neurology recommended MRI brain.  Due to having a pacemaker hide MRI will be done in the morning.  After the palpitations resolved her slurred speech also resolved.  Neurology/stroke team recommended admission for observation until can obtain an MRI brain to rule out CVA or TIA.  Admitted by [facility], hospitalist service.  ED Course: Tmax 97.9.  BP 138/77, pulse 57, respiration rate 15, O2 saturation 98% on room air.  Lab studies essentially unremarkable.  Review of Systems: Review of systems as noted in the HPI. All other systems reviewed and are negative.   Past Medical History:  Diagnosis Date   Acute myocardial infarction of other lateral wall, initial episode of care 01/2013   DES CFX   Aortoiliac occlusive disease ([place]) 08/22/2016   CAD (coronary artery disease) 11/13/2011   50% ? proximal LCx stenosis per Cath at [facility]   Chest pain, atypical, muscular skelatal   01/09/2014   COVID 02/08/2020   Dyspnea    Heart failure (HCC)    HLD (hyperlipidemia)    Hypertension    Intermediate coronary syndrome (HCC)    LBBB (left bundle branch block)    Mitral regurgitation    Nonischemic cardiomyopathy ([place]) 01/26/2017   Presence of combination  internal cardiac defibrillator (ICD) and pacemaker    PVD (peripheral vascular disease) ([place])    Right CFA stenosis per report on 11/14/2011   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/25/2016   Edwards McArthy-Adams IMR ETLogix ring annuloplasty (Model 4100, Serial [id number], size 26) placed via right mini thoracotomy approach   Stenosis of left subclavian artery ([place]) 08/22/2016   Stroke ([place])    tia with no deficits   Past Surgical History:  Procedure Laterality Date   AORTIC ARCH ANGIOGRAPHY N/A 09/21/2021   Procedure: AORTIC ARCH ANGIOGRAPHY;  Surgeon: Wellington Hampshire, MD;  Location: [place] CV LAB;  Service: Cardiovascular;  Laterality: N/A;   APPENDECTOMY     BIV ICD INSERTION CRT-D N/A 01/24/2017   Procedure: BIV ICD INSERTION CRT-D;  Surgeon: Evans Lance, MD;  Location: [place] CV LAB;  Service: Cardiovascular;  Laterality: N/A;   CARDIAC CATHETERIZATION  04/14/2013   Non-obstructive disease, patent CFX stent   CARDIAC CATHETERIZATION N/A 09/21/2014   Procedure: Left Heart Cath and Coronary Angiography;  Surgeon: Troy Sine, MD;  Location: [place] CV LAB;  Service: Cardiovascular;  Laterality: N/A;   CORONARY ANGIOPLASTY WITH STENT PLACEMENT  01/17/2013   mild disease except 99% CFX, rx with  2.5 x 28 Alpine drug-eluting stent    GROIN DEBRIDEMENT Right 04/14/2013   Procedure: Emergency Evacuation of Retroperitoneal Hematoma and Repair Right External Iliac Artery Pseudoaneurysm    ;  Surgeon: Elam Dutch, MD;  Location: [place];  Service: Vascular;  Laterality: Right;   [place]  N/A 01/18/2013   Procedure: LEFT HEART CATHETERIZATION WITH CORONARY ANGIOGRAM;  Surgeon: Jettie Booze, MD;  Location: [facility] CATH LAB;  Service: Cardiovascular;  Laterality: N/A;   LEFT HEART CATHETERIZATION WITH CORONARY ANGIOGRAM N/A 04/14/2013   Procedure: LEFT HEART CATHETERIZATION WITH CORONARY ANGIOGRAM;  Surgeon: Jettie Booze,  MD;  Location: [facility] CATH LAB;  Service: Cardiovascular;  Laterality: N/A;   MITRAL VALVE REPAIR Right 08/25/2016   Procedure: MINIMALLY INVASIVE MITRAL VALVE REPAIR;  Surgeon: Rexene Alberts, MD;  Location: [place];  Service: Open Heart Surgery;  Laterality: Right;   MULTIPLE EXTRACTIONS WITH ALVEOLOPLASTY N/A 08/23/2016   Procedure: Extraction of tooth #'s 17, 20-23, 26-29 with alveoloplasty;  Surgeon: Lenn Cal, DDS;  Location: [place];  Service: Oral Surgery;  Laterality: N/A;   PERCUTANEOUS CORONARY STENT INTERVENTION (PCI-S)  01/18/2013   Procedure: PERCUTANEOUS CORONARY STENT INTERVENTION (PCI-S);  Surgeon: Jettie Booze, MD;  Location: [facility] CATH LAB;  Service: Cardiovascular;;   RIGHT/LEFT HEART CATH AND CORONARY ANGIOGRAPHY N/A 08/21/2016   Procedure: RIGHT/LEFT HEART CATH AND CORONARY ANGIOGRAPHY;  Surgeon: Burnell Blanks, MD;  Location: [place] CV LAB;  Service: Cardiovascular;  Laterality: N/A;   TEE WITHOUT CARDIOVERSION N/A 03/06/2014   Procedure: TRANSESOPHAGEAL ECHOCARDIOGRAM (TEE);  Surgeon: Dorothy Spark, MD;  Location: [place];  Service: Cardiovascular;  Laterality: N/A;   TEE WITHOUT CARDIOVERSION N/A 08/18/2016   Procedure: TRANSESOPHAGEAL ECHOCARDIOGRAM (TEE);  Surgeon: Lelon Perla, MD;  Location: [facility] ENDOSCOPY;  Service: Cardiovascular;  Laterality: N/A;   TEE WITHOUT CARDIOVERSION N/A 08/25/2016   Procedure: TRANSESOPHAGEAL ECHOCARDIOGRAM (TEE);  Surgeon: Rexene Alberts, MD;  Location: [place];  Service: Open Heart Surgery;  Laterality: N/A;   TUBAL LIGATION     UPPER EXTREMITY INTERVENTION  09/21/2021   Procedure: UPPER EXTREMITY INTERVENTION;  Surgeon: Wellington Hampshire, MD;  Location: [place] CV LAB;  Service: Cardiovascular;;    Social History:  reports that she has been smoking cigarettes. She has been smoking an average of .5 packs per day. She has never used smokeless tobacco. She reports that she does not currently use drugs after  having used the following drugs: Marijuana. She reports that she does not drink alcohol.   Allergies  Allergen Reactions   Aldomet [Methyldopa] Other (See Comments)    Severe hypotension   Brilinta [Ticagrelor] Shortness Of Breath   Other Other (See Comments)    Nuclear Stress Test Medication caused seizures   Coumadin [Warfarin Sodium] Swelling    Arm swelling   Desyrel [Trazodone] Other (See Comments)    Could not wake up from medication, comatosed   Eliquis [Apixaban] Other (See Comments)    BP got too low   Entresto [Sacubitril-Valsartan] Hypertension    Hypotension    Zestril [Lisinopril]     Significant blood pressure fluctuations    Penicillins Rash    Has patient had a PCN reaction causing immediate rash, facial/tongue/throat swelling, SOB or lightheadedness with hypotension: Yes Has patient had a PCN reaction causing severe rash involving mucus membranes or skin necrosis: No Has patient had a PCN reaction that required hospitalization: No Has patient had a PCN reaction occurring within the last 10 years: No If all of the above answers are "NO", then may proceed with Cephalosporin use.     Family History  Problem Relation Age of Onset   CAD Mother 44   Lung cancer Mother    Bladder Cancer Mother    Stroke Mother    Heart  disease Mother        Before age 23   Hypertension Mother    Heart attack Mother    CAD Father 40   Heart disease Father        Before age 58   Heart attack Father    Stroke Father        Bleeding stroke      Prior to Admission medications   Medication Sig Start Date End Date Taking? Authorizing Provider  aspirin EC 81 MG tablet Take 81 mg by mouth daily.   Yes [provider]  atorvastatin (LIPITOR) 80 MG tablet Take 1 tablet (80 mg total) by mouth daily. 09/07/20  Yes Evans Lance, MD  carvedilol (COREG) 6.25 MG tablet Take 1 tablet (6.25 mg total) by mouth 2 (two) times daily. 10/20/21  Yes Jettie Booze, MD   clopidogrel (PLAVIX) 75 MG tablet Take 1 tablet (75 mg total) by mouth daily. 09/10/20  Yes Evans Lance, MD  ergocalciferol (VITAMIN D2) 1.25 MG (50000 UT) capsule Take 1 capsule (50,000 Units total) by mouth every Friday. Patient taking differently: Take 50,000 Units by mouth once a week. 02/10/22  Yes Wellington Hampshire, MD  furosemide (LASIX) 40 MG tablet Take 1 tablet (40 mg total) by mouth daily as needed for fluid or edema (shortness of breath). 03/05/20  Yes Shirley Friar, PA-C  losartan (COZAAR) 25 MG tablet Take 1 tablet (25 mg total) by mouth daily. 02/07/22  Yes Wellington Hampshire, MD  Menthol-Methyl Salicylate (SALONPAS PAIN RELIEF PATCH EX) Apply 1 patch topically daily as needed (pain).   Yes [provider]  pantoprazole (PROTONIX) 40 MG tablet Take 1 tablet (40 mg total) by mouth daily. 02/07/22  Yes Wellington Hampshire, MD  potassium chloride (KLOR-CON) 10 MEQ tablet Take 2 tablets (20 mEq total) by mouth daily. 09/10/20  Yes Evans Lance, MD  spironolactone (ALDACTONE) 25 MG tablet Take 0.5 tablets (12.5 mg total) by mouth daily. 02/07/22  Yes Wellington Hampshire, MD  Bempedoic Acid (NEXLETOL) 180 MG TABS Take 1 tablet by mouth daily. Patient not taking: Reported on 03/13/2022 11/04/21   Jettie Booze, MD  lidocaine 4 % Place 1 patch onto the skin daily. Patient not taking: Reported on 03/13/2022 03/05/22   Elgie Congo, MD  nicotine (NICODERM CQ - DOSED IN MG/24 HOURS) 14 mg/24hr patch Apply daily for 6 weeks, then decrease to '7mg'$  daily for 2 weeks Patient not taking: Reported on 03/05/2022 11/07/21   Jettie Booze, MD  nicotine (NICODERM CQ - DOSED IN MG/24 HR) 7 mg/24hr patch Place 1 patch (7 mg total) onto the skin daily. Patient not taking: Reported on 03/05/2022 11/07/21   Jettie Booze, MD  nicotine (NICODERM CQ) 21 mg/24hr patch Place 1 patch (21 mg total) onto the skin daily. Patient not taking: Reported on 03/13/2022 03/05/22   Elgie Congo,  MD  oxyCODONE-acetaminophen (PERCOCET/ROXICET) 5-325 MG tablet Take 1 tablet by mouth every 6 (six) hours as needed for up to 10 doses for severe pain. Patient not taking: Reported on 03/13/2022 03/05/22   Elgie Congo, MD    Physical Exam: BP (!) 141/72 (BP Location: Left Arm)   Pulse 65   Temp 97.9 F (36.6 C) (Oral)   Resp 15   SpO2 99%   General: 57 y.o. year-old female well developed well nourished in no acute distress.  Alert and oriented x3. Cardiovascular: Regular rate and rhythm with no  rubs or gallops.  No thyromegaly or JVD noted.  No lower extremity edema. 2/4 pulses in all 4 extremities. Respiratory: Clear to auscultation with no wheezes or rales. Good inspiratory effort. Abdomen: Soft nontender nondistended with normal bowel sounds x4 quadrants. Muskuloskeletal: No cyanosis, clubbing or edema noted bilaterally Neuro: CN II-XII intact, strength, sensation, reflexes Skin: No ulcerative lesions noted or rashes Psychiatry: Judgement and insight appear normal. Mood is appropriate for condition and setting          Labs on Admission:  Basic Metabolic Panel: Recent Labs  Lab 03/13/22 1728  NA 138  K 4.4  CL 105  CO2 20*  GLUCOSE 95  BUN 15  CREATININE 0.97  CALCIUM 9.7  MG 1.8   Liver Function Tests: No results for input(s): "AST", "ALT", "ALKPHOS", "BILITOT", "PROT", "ALBUMIN" in the last 168 hours. No results for input(s): "LIPASE", "AMYLASE" in the last 168 hours. No results for input(s): "AMMONIA" in the last 168 hours. CBC: Recent Labs  Lab 03/13/22 1728  WBC 9.1  NEUTROABS 5.4  HGB 17.4*  HCT 51.8*  MCV 87.9  PLT 213   Cardiac Enzymes: No results for input(s): "CKTOTAL", "CKMB", "CKMBINDEX", "TROPONINI" in the last 168 hours.  BNP (last 3 results) Recent Labs    08/14/21 2126  BNP 462.0*    ProBNP (last 3 results) No results for input(s): "PROBNP" in the last 8760 hours.  CBG: No results for input(s): "GLUCAP" in the last 168  hours.  Radiological Exams on Admission: CT Head Wo Contrast  Result Date: 03/13/2022 CLINICAL DATA:  57 year old female with acute neuro deficit EXAM: CT HEAD WITHOUT CONTRAST TECHNIQUE: Contiguous axial images were obtained from the base of the skull through the vertex without intravenous contrast. RADIATION DOSE REDUCTION: This exam was performed according to the departmental dose-optimization program which includes automated exposure control, adjustment of the mA and/or kV according to patient size and/or use of iterative reconstruction technique. COMPARISON:  08/14/2021 CT and prior studies FINDINGS: Brain: A new 8 mm hypodensity within the white matter adjacent to the LEFT basal ganglia is compatible with age indeterminate infarct. There is no evidence of hemorrhage. There is no evidence of mass lesion or mass effect, hydrocephalus, extra-axial collection or other infarct. Mild atrophy noted. Vascular: Carotid atherosclerotic calcifications are noted. Skull: Normal. Negative for fracture or focal lesion. Sinuses/Orbits: No acute finding. Other: None. IMPRESSION: 1. 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 Consider MRI for further evaluation as clinically indicated. 2. Mild atrophy. Electronically Signed   By: Margarette Canada M.D.   On: 03/13/2022 18:56   DG Chest Port 1 View  Result Date: 03/13/2022 CLINICAL DATA:  Tachycardia, chest pain, short of breath EXAM: PORTABLE CHEST 1 VIEW COMPARISON:  03/05/2022 FINDINGS: Single frontal view of the chest demonstrates multi lead pacer/AICD unchanged. Postsurgical changes of the mitral valve. Stable mild cardiomegaly. No acute airspace disease, effusion, or pneumothorax. Stable stent in the left subclavian distribution. No acute bony abnormality. IMPRESSION: 1. Stable chest, no acute process. Electronically Signed   By: Randa Ngo M.D.   On: 03/13/2022 18:38    EKG: I independently viewed the EKG done and my findings are as followed:  Sinus rhythm rate of 74.  Nonspecific ST-T changes.  QTc 526.  Assessment/Plan Present on Admission:  Slurred speech  Principal Problem:   Slurred speech Active Problems:   AVNRT (AV nodal re-entry tachycardia)  Symptomatic AV nodal reentry tachycardia Seen by cardiology, status post override pacing by Dr. Sallyanne Kuster. Rate  is currently controlled post overdrive pacing. Continue to monitor on telemetry.  Cardiology signed off.  No changes in cardiac medications.  Resolved slurred speech, rule out TIA versus CVA MRI brain is pending, recommended by neurology Frequent neuro-checks Fasting lipid panel and A1c PT OT speech assessment  Coronary artery disease Denies any anginal symptoms. Resume home aspirin, Plavix and Lipitor  GERD Resume home Protonix.  HFpEF last 2D echo done on 09/01/2021 showed LVEF 50 to 55% Start strict I's and O's and daily weight.   DVT prophylaxis: Subcu Lovenox daily  Code Status: Full code  Family Communication: None at bedside.  Disposition Plan: Admitted to telemetry medical unit.  Consults called: Cardiology.  Admission status: Observation status.   Status is: Observation    Kayleen Memos MD [facility] Pager [phone]  If 7PM-7AM, please contact night-coverage [email or web address] Password [email or web address]  03/14/2022, 2:45 AM

## 2022-03-14 NOTE — Evaluation (Signed)
Occupational Therapy Evaluation Patient Details Name: Lindsay Camacho MRN: [id number] DOB: [birth year] Today's Date: 03/14/2022   History of Present Illness 57 yo female presents with chest pain and slurred speech. CT (+) age indeterminate infarct MRI pending PMH CAD, cardiomyopathy s/p cardiac defibrillation and pacemake, smoker, TIA 2018   Clinical Impression   Pt currently with workup for CVA and pending MRI. Pt reports SOB and decreased balance with ambulation at mod (A) grabbing counter surfaces. Pt is able to complete adls and self feeding in a static sitting environment without deficits. Pt reports she has only "uncle larry" whom is not really family to call for anything. Pt reports that she lives alone and drives. Pt could not manage a shower at this time by herself due to balance deficits and is demonstrating higher level than CIR. Recommendation for SNF until pt is able to progress to home level at mod I. Recommendation for rollator to allow pt safe transfers and seated rest breaks for energy conservation.       Recommendations for follow up therapy are one component of a multi-disciplinary discharge planning process, led by the attending physician.  Recommendations may be updated based on patient status, additional functional criteria and insurance authorization.   Follow Up Recommendations  Skilled nursing-short term rehab (<3 hours/day) (pending progress with therapy- pt could not safely manage at home at current level without some support)     Assistance Recommended at Discharge Set up Supervision/Assistance  Patient can return home with the following A little help with walking and/or transfers;A little help with bathing/dressing/bathroom    Functional Status Assessment  Patient has had a recent decline in their functional status and demonstrates the ability to make significant improvements in function in a reasonable and predictable amount of time.  Equipment Recommendations   Other (comment) (rollator)    Recommendations for Other Services       Precautions / Restrictions Precautions Precautions: Fall      Mobility Bed Mobility Overal bed mobility: Independent                  Transfers Overall transfer level: Independent                        Balance Overall balance assessment: Needs assistance Sitting-balance support: Bilateral upper extremity supported, Feet supported Sitting balance-Leahy Scale: Normal     Standing balance support: No upper extremity supported, During functional activity Standing balance-Leahy Scale: Good               High level balance activites: Direction changes, Turns High Level Balance Comments: pt requries external support and grabbing at counter surfaces.           ADL either performed or assessed with clinical judgement   ADL Overall ADL's : Needs assistance/impaired Eating/Feeding: Independent   Grooming: Wash/dry hands Grooming Details (indicate cue type and reason): standing at sink washing hands/ scanning appropriately for soap and using automatic sink without deficits Upper Body Bathing: Modified independent;Sitting       Upper Body Dressing : Modified independent;Sitting   Lower Body Dressing: Modified independent;Sit to/from stand Lower Body Dressing Details (indicate cue type and reason): don boots that pt had to use bil UE to pull on Toilet Transfer: Minimal assistance           Functional mobility during ADLs: Moderate assistance General ADL Comments: pt can complete adls seated but when up on her feet reports feeling SOB. pt unable  to make it house hold distance without mod (A) hand held (A) due to decreased activity tolerance. pt noted to have O2 91% RA VSS. pt once seated in the bed able to self feed indep without deficits     Vision Baseline Vision/History: 1 Wears glasses (readers) Patient Visual Report: No change from baseline Vision Assessment?: No apparent  visual deficits     Perception     Praxis      Pertinent Vitals/Pain Pain Assessment Pain Assessment: No/denies pain     Hand Dominance Right   Extremity/Trunk Assessment Upper Extremity Assessment Upper Extremity Assessment: Overall WFL for tasks assessed   Lower Extremity Assessment Lower Extremity Assessment: Overall WFL for tasks assessed   Cervical / Trunk Assessment Cervical / Trunk Assessment: Normal   Communication Communication Communication: No difficulties (pt reports feels like its back to herself)   Cognition Arousal/Alertness: Awake/alert Behavior During Therapy: Restless Overall Cognitive Status: Within Functional Limits for tasks assessed                                       General Comments  VSS RA    Exercises     Shoulder Instructions      Home Living Family/patient expects to be discharged to:: Private residence Living Arrangements: Alone   Type of Home: House Home Access: Level entry     Home Layout: Two level;Able to live on main level with bedroom/bathroom;Laundry or work area in Building surveyor of Steps: flight of stairs to wash clothes   Bathroom Shower/Tub: Teacher, early years/pre: Standard     Home Equipment: None   Additional Comments: call [name] ( not real relative), no animals      Prior Functioning/Environment Prior Level of Function : Independent/Modified Independent;Driving                        OT Problem List: Decreased strength;Impaired balance (sitting and/or standing);Decreased activity tolerance;Decreased knowledge of use of DME or AE;Decreased knowledge of precautions;Cardiopulmonary status limiting activity      OT Treatment/Interventions: Self-care/ADL training;Therapeutic exercise;Energy conservation;DME and/or AE instruction;Therapeutic activities;Patient/family education;Balance training    OT Goals(Current goals can be found in the care  plan section) Acute Rehab OT Goals Patient Stated Goal: to figure out why i can't breath OT Goal Formulation: With patient Time For Goal Achievement: 03/28/22 Potential to Achieve Goals: Good  OT Frequency: Min 2X/week    Co-evaluation              AM-PAC OT "6 Clicks" Daily Activity     Outcome Measure Help from another person eating meals?: None Help from another person taking care of personal grooming?: None Help from another person toileting, which includes using toliet, bedpan, or urinal?: A Little Help from another person bathing (including washing, rinsing, drying)?: A Little Help from another person to put on and taking off regular upper body clothing?: None Help from another person to put on and taking off regular lower body clothing?: None 6 Click Score: 22   End of Session Equipment Utilized During Treatment: Gait belt Nurse Communication: Mobility status;Precautions  Activity Tolerance: Patient tolerated treatment well;Other (comment) (SOB limiting) Patient left: in bed;with call bell/phone within reach (ed stretcher)  OT Visit Diagnosis: Unsteadiness on feet (R26.81)                Time: [id number]  OT Time Calculation (min): 18 min Charges:  OT General Charges $OT Visit: 1 Visit OT Evaluation $OT Eval Moderate Complexity: 1 Mod   Brynn, OTR/L  Acute Rehabilitation Services Office: [phone] .   Jeri Modena 03/14/2022, 9:56 AM

## 2022-03-14 NOTE — ED Notes (Signed)
Pt worked with pt

## 2022-03-14 NOTE — ED Notes (Signed)
PT at bedside.

## 2022-03-14 NOTE — Plan of Care (Signed)
  Problem: Education: Goal: Knowledge of General Education information will improve Description: Including pain rating scale, medication(s)/side effects and non-pharmacologic comfort measures Outcome: Progressing   Problem: Safety: Goal: Ability to remain free from injury will improve Outcome: Progressing   

## 2022-03-14 NOTE — ED Notes (Addendum)
speech at bedside

## 2022-03-14 NOTE — ED Notes (Signed)
ED TO INPATIENT HANDOFF REPORT  ED Nurse Name and Phone #: [phone]  S Name/Age/Gender Lindsay Camacho 57 y.o. female Room/Bed: 010C/010C  Code Status   Code Status: Full Code  Home/SNF/Other Home Patient oriented to: self, place, time, and situation Is this baseline? Yes   Triage Complete: Triage complete  Chief Complaint Slurred speech [R47.81]  Triage Note Pt  here from home with c/o chest pain and sob that started today while she sitting in a chair , pt heart 150's in triage     Allergies Allergies  Allergen Reactions   Aldomet [Methyldopa] Other (See Comments)    Severe hypotension   Brilinta [Ticagrelor] Shortness Of Breath   Other Other (See Comments)    Nuclear Stress Test Medication caused seizures   Coumadin [Warfarin Sodium] Swelling    Arm swelling   Desyrel [Trazodone] Other (See Comments)    Could not wake up from medication, comatosed   Eliquis [Apixaban] Other (See Comments)    BP got too low   Entresto [Sacubitril-Valsartan] Hypertension    Hypotension    Zestril [Lisinopril]     Significant blood pressure fluctuations    Penicillins Rash    Has patient had a PCN reaction causing immediate rash, facial/tongue/throat swelling, SOB or lightheadedness with hypotension: Yes Has patient had a PCN reaction causing severe rash involving mucus membranes or skin necrosis: No Has patient had a PCN reaction that required hospitalization: No Has patient had a PCN reaction occurring within the last 10 years: No If all of the above answers are "NO", then may proceed with Cephalosporin use.     Level of Care/Admitting Diagnosis ED Disposition     ED Disposition  Admit   Condition  --   Comment  Hospital Area: [place] [[id number]]  Level of Care: Telemetry Medical [104]  May place patient in observation at [facility] or [facility] if equivalent level of care is available:: No  Covid Evaluation: Asymptomatic - no recent exposure (last 10  days) testing not required  Diagnosis: Slurred speech [id number]  Admitting Physician: Kayleen Memos [id number]  Attending Physician: Kayleen Memos [id number]          B Medical/Surgery History Past Medical History:  Diagnosis Date   Acute myocardial infarction of other lateral wall, initial episode of care 01/2013   DES CFX   Aortoiliac occlusive disease ([place]) 08/22/2016   CAD (coronary artery disease) 11/13/2011   50% ? proximal LCx stenosis per [facility] at [facility]   Chest pain, atypical, muscular skelatal   01/09/2014   COVID 02/08/2020   Dyspnea    Heart failure (HCC)    HLD (hyperlipidemia)    Hypertension    Intermediate coronary syndrome (HCC)    LBBB (left bundle branch block)    Mitral regurgitation    Nonischemic cardiomyopathy ([place]) 01/26/2017   Presence of combination internal cardiac defibrillator (ICD) and pacemaker    PVD (peripheral vascular disease) ([place])    Right CFA stenosis per report on 11/14/2011   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/25/2016   Edwards McArthy-Adams IMR ETLogix ring annuloplasty (Model 4100, Serial [id number], size 26) placed via right mini thoracotomy approach   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   Stroke ([place])    tia with no deficits   Past Surgical History:  Procedure Laterality Date   AORTIC ARCH ANGIOGRAPHY N/A 09/21/2021   Procedure: AORTIC ARCH ANGIOGRAPHY;  Surgeon: Wellington Hampshire, MD;  Location: [place] CV  LAB;  Service: Cardiovascular;  Laterality: N/A;   APPENDECTOMY     BIV ICD INSERTION CRT-D N/A 01/24/2017   Procedure: BIV ICD INSERTION CRT-D;  Surgeon: Evans Lance, MD;  Location: [place] CV LAB;  Service: Cardiovascular;  Laterality: N/A;   CARDIAC CATHETERIZATION  04/14/2013   Non-obstructive disease, patent CFX stent   CARDIAC CATHETERIZATION N/A 09/21/2014   Procedure: Left Heart Cath and Coronary Angiography;  Surgeon: Troy Sine, MD;  Location: [place] CV LAB;  Service:  Cardiovascular;  Laterality: N/A;   CORONARY ANGIOPLASTY WITH STENT PLACEMENT  01/17/2013   mild disease except 99% CFX, rx with  2.5 x 28 Alpine drug-eluting stent    GROIN DEBRIDEMENT Right 04/14/2013   Procedure: Emergency Evacuation of Retroperitoneal Hematoma and Repair Right External Iliac Artery Pseudoaneurysm    ;  Surgeon: Elam Dutch, MD;  Location: [place];  Service: Vascular;  Laterality: Right;   LEFT HEART CATHETERIZATION WITH CORONARY ANGIOGRAM N/A 01/18/2013   Procedure: LEFT HEART CATHETERIZATION WITH CORONARY ANGIOGRAM;  Surgeon: Jettie Booze, MD;  Location: [facility] CATH LAB;  Service: Cardiovascular;  Laterality: N/A;   LEFT HEART CATHETERIZATION WITH CORONARY ANGIOGRAM N/A 04/14/2013   Procedure: LEFT HEART CATHETERIZATION WITH CORONARY ANGIOGRAM;  Surgeon: Jettie Booze, MD;  Location: [facility] CATH LAB;  Service: Cardiovascular;  Laterality: N/A;   MITRAL VALVE REPAIR Right 08/25/2016   Procedure: MINIMALLY INVASIVE MITRAL VALVE REPAIR;  Surgeon: Rexene Alberts, MD;  Location: [place];  Service: Open Heart Surgery;  Laterality: Right;   MULTIPLE EXTRACTIONS WITH ALVEOLOPLASTY N/A 08/23/2016   Procedure: Extraction of tooth #'s 17, 20-23, 26-29 with alveoloplasty;  Surgeon: Lenn Cal, DDS;  Location: [place];  Service: Oral Surgery;  Laterality: N/A;   PERCUTANEOUS CORONARY STENT INTERVENTION (PCI-S)  01/18/2013   Procedure: PERCUTANEOUS CORONARY STENT INTERVENTION (PCI-S);  Surgeon: Jettie Booze, MD;  Location[facility] CATH LAB;  Service: Cardiovascular;;   RIGHT/LEFT HEART CATH AND CORONARY ANGIOGRAPHY N/A 08/21/2016   Procedure: RIGHT/LEFT HEART CATH AND CORONARY ANGIOGRAPHY;  Surgeon: Burnell Blanks, MD;  Location: [place] CV LAB;  Service: Cardiovascular;  Laterality: N/A;   TEE WITHOUT CARDIOVERSION N/A 03/06/2014   Procedure: TRANSESOPHAGEAL ECHOCARDIOGRAM (TEE);  Surgeon: Dorothy Spark, MD;  Location: [place];  Service: Cardiovascular;   Laterality: N/A;   TEE WITHOUT CARDIOVERSION N/A 08/18/2016   Procedure: TRANSESOPHAGEAL ECHOCARDIOGRAM (TEE);  Surgeon: Lelon Perla, MD;  Location: [facility] ENDOSCOPY;  Service: Cardiovascular;  Laterality: N/A;   TEE WITHOUT CARDIOVERSION N/A 08/25/2016   Procedure: TRANSESOPHAGEAL ECHOCARDIOGRAM (TEE);  Surgeon: Rexene Alberts, MD;  Location: [place];  Service: Open Heart Surgery;  Laterality: N/A;   TUBAL LIGATION     UPPER EXTREMITY INTERVENTION  09/21/2021   Procedure: UPPER EXTREMITY INTERVENTION;  Surgeon: Wellington Hampshire, MD;  Location: [place] CV LAB;  Service: Cardiovascular;;     A IV Location/Drains/Wounds Patient Lines/Drains/Airways Status     Active Line/Drains/Airways     Name Placement date Placement time Site Days   Peripheral IV 03/14/22 20 G Right;Posterior;Medial Forearm 03/14/22  0309  Forearm  less than 1            Intake/Output Last 24 hours No intake or output data in the 24 hours ending 03/14/22 1348  Labs/Imaging Results for orders placed or performed during the hospital encounter of 03/13/22 (from the past 48 hour(s))  CBC with Differential     Status: Abnormal   Collection Time: 03/13/22  5:28 PM  Result Value Ref Range   WBC 9.1 4.0 - 10.5 K/uL   RBC 5.89 (H) 3.87 - 5.11 MIL/uL   Hemoglobin 17.4 (H) 12.0 - 15.0 g/dL   HCT 51.8 (H) 36.0 - 46.0 %   MCV 87.9 80.0 - 100.0 fL   MCH 29.5 26.0 - 34.0 pg   MCHC 33.6 30.0 - 36.0 g/dL   RDW 13.7 11.5 - 15.5 %   Platelets 213 150 - 400 K/uL   nRBC 0.0 0.0 - 0.2 %   Neutrophils Relative % 59 %   Neutro Abs 5.4 1.7 - 7.7 K/uL   Lymphocytes Relative 29 %   Lymphs Abs 2.7 0.7 - 4.0 K/uL   Monocytes Relative 9 %   Monocytes Absolute 0.8 0.1 - 1.0 K/uL   Eosinophils Relative 2 %   Eosinophils Absolute 0.2 0.0 - 0.5 K/uL   Basophils Relative 1 %   Basophils Absolute 0.1 0.0 - 0.1 K/uL   Immature Granulocytes 0 %   Abs Immature Granulocytes 0.02 0.00 - 0.07 K/uL    Comment: Performed at [facility], [address] [place], [place] [id number]  Basic metabolic panel     Status: Abnormal   Collection Time: 03/13/22  5:28 PM  Result Value Ref Range   Sodium 138 135 - 145 mmol/L   Potassium 4.4 3.5 - 5.1 mmol/L   Chloride 105 98 - 111 mmol/L   CO2 20 (L) 22 - 32 mmol/L   Glucose, Bld 95 70 - 99 mg/dL    Comment: Glucose reference range applies only to samples taken after fasting for at least 8 hours.   BUN 15 6 - 20 mg/dL   Creatinine, Ser 0.97 0.44 - 1.00 mg/dL   Calcium 9.7 8.9 - 10.3 mg/dL   GFR, Estimated >60 >60 mL/min    Comment: (NOTE) Calculated using the CKD-EPI Creatinine Equation (2021)    Anion gap 13 5 - 15    Comment: Performed at [facility] [address], [place], [place] [address]  Magnesium     Status: None   Collection Time: 03/13/22  5:28 PM  Result Value Ref Range   Magnesium 1.8 1.7 - 2.4 mg/dL    Comment: Performed at [facility], [place] [address], [place], [place] [address]  Troponin I (High Sensitivity)     Status: None   Collection Time: 03/13/22  5:28 PM  Result Value Ref Range   Troponin I (High Sensitivity) 17 <18 ng/L    Comment: (NOTE) Elevated high sensitivity troponin I (hsTnI) values and significant  changes across serial measurements may suggest ACS but many other  chronic and acute conditions are known to elevate hsTnI results.  Refer to the "Links" section for chest pain algorithms and additional  guidance. Performed at [facility], [place] [address], [place], [place] [address]   CBC     Status: Abnormal   Collection Time: 03/14/22  2:51 AM  Result Value Ref Range   WBC 6.0 4.0 - 10.5 K/uL   RBC 5.26 (H) 3.87 - 5.11 MIL/uL   Hemoglobin 15.4 (H) 12.0 - 15.0 g/dL   HCT 46.9 (H) 36.0 - 46.0 %   MCV 89.2 80.0 - 100.0 fL   MCH 29.3 26.0 - 34.0 pg   MCHC 32.8 30.0 - 36.0 g/dL   RDW 13.8 11.5 - 15.5 %   Platelets 169 150 - 400 K/uL   nRBC 0.0 0.0 - 0.2 %  Comment: Performed at [facility],  [place] [address] [place], [place] [id number]  Basic metabolic panel     Status: None   Collection Time: 03/14/22  2:51 AM  Result Value Ref Range   Sodium 138 135 - 145 mmol/L   Potassium 3.9 3.5 - 5.1 mmol/L   Chloride 108 98 - 111 mmol/L   CO2 24 22 - 32 mmol/L   Glucose, Bld 92 70 - 99 mg/dL    Comment: Glucose reference range applies only to samples taken after fasting for at least 8 hours.   BUN 13 6 - 20 mg/dL   Creatinine, Ser 0.89 0.44 - 1.00 mg/dL   Calcium 8.9 8.9 - 10.3 mg/dL   GFR, Estimated >60 >60 mL/min    Comment: (NOTE) Calculated using the CKD-EPI Creatinine Equation (2021)    Anion gap 6 5 - 15    Comment: Performed at [facility] [address], [place], [place] [address]  Magnesium     Status: None   Collection Time: 03/14/22  2:51 AM  Result Value Ref Range   Magnesium 1.7 1.7 - 2.4 mg/dL    Comment: Performed at [facility], [place] [address], [place], [place] [address]  Phosphorus     Status: None   Collection Time: 03/14/22  2:51 AM  Result Value Ref Range   Phosphorus 3.6 2.5 - 4.6 mg/dL    Comment: Performed at [facility] [address] [place], [place] [address]  Lipid panel     Status: Abnormal   Collection Time: 03/14/22  5:28 AM  Result Value Ref Range   Cholesterol 163 0 - 200 mg/dL   Triglycerides 87 <150 mg/dL   HDL 34 (L) >40 mg/dL   Total CHOL/HDL Ratio 4.8 RATIO   VLDL 17 0 - 40 mg/dL   LDL Cholesterol 112 (H) 0 - 99 mg/dL    Comment:        Total Cholesterol/HDL:CHD Risk Coronary Heart Disease Risk Table                     Men   Women  1/2 Average Risk   3.4   3.3  Average Risk       5.0   4.4  2 X Average Risk   9.6   7.1  3 X Average Risk  23.4   11.0        Use the calculated Patient Ratio above and the CHD Risk Table to determine the patient's CHD Risk.        ATP III CLASSIFICATION (LDL):  <100     mg/dL   Optimal  100-129  mg/dL   Near or Above                    Optimal  130-159  mg/dL   Borderline   160-189  mg/dL   High  >190     mg/dL   Very High Performed at [facility] [address], [place], [place] [address]    CT Head Wo Contrast  Result Date: 03/13/2022 CLINICAL DATA:  57 year old female with acute neuro deficit EXAM: CT HEAD WITHOUT CONTRAST TECHNIQUE: Contiguous axial images were obtained from the base of the skull through the vertex without intravenous contrast. RADIATION DOSE REDUCTION: This exam was performed according to the departmental dose-optimization program which includes automated exposure control, adjustment of the mA and/or kV according to patient size and/or  use of iterative reconstruction technique. COMPARISON:  08/14/2021 CT and prior studies FINDINGS: Brain: A new 8 mm hypodensity within the white matter adjacent to the LEFT basal ganglia is compatible with age indeterminate infarct. There is no evidence of hemorrhage. There is no evidence of mass lesion or mass effect, hydrocephalus, extra-axial collection or other infarct. Mild atrophy noted. Vascular: Carotid atherosclerotic calcifications are noted. Skull: Normal. Negative for fracture or focal lesion. Sinuses/Orbits: No acute finding. Other: None. IMPRESSION: 1. 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 Consider MRI for further evaluation as clinically indicated. 2. Mild atrophy. Electronically Signed   By: Margarette Canada M.D.   On: 03/13/2022 18:56   DG Chest Port 1 View  Result Date: 03/13/2022 CLINICAL DATA:  Tachycardia, chest pain, short of breath EXAM: PORTABLE CHEST 1 VIEW COMPARISON:  03/05/2022 FINDINGS: Single frontal view of the chest demonstrates multi lead pacer/AICD unchanged. Postsurgical changes of the mitral valve. Stable mild cardiomegaly. No acute airspace disease, effusion, or pneumothorax. Stable stent in the left subclavian distribution. No acute bony abnormality. IMPRESSION: 1. Stable chest, no acute process. Electronically Signed   By: Randa Ngo M.D.   On:  03/13/2022 18:38    Pending Labs Unresulted Labs (From admission, onward)     Start     Ordered   03/14/22 0510  Hemoglobin A1c  Once,   R        03/14/22 0509            Vitals/Pain Today's Vitals   03/14/22 0930 03/14/22 1145 03/14/22 1230 03/14/22 1317  BP: (!) 143/79 (!) 167/85 (!) 158/79 (!) 153/69  Pulse: 64 67 64 61  Resp: '12 19 18 19  '$ Temp:    98 F (36.7 C)  TempSrc:      SpO2: 96% 98% 99% 96%  Weight:      Height:      PainSc:        Isolation Precautions No active isolations  Medications Medications  lactated ringers infusion ( Intravenous New Bag/Given 03/14/22 0312)  aspirin EC tablet 81 mg (81 mg Oral Given 03/14/22 0927)  atorvastatin (LIPITOR) tablet 80 mg (80 mg Oral Given 03/14/22 0927)  clopidogrel (PLAVIX) tablet 75 mg (75 mg Oral Given 03/14/22 0927)  pantoprazole (PROTONIX) EC tablet 40 mg (40 mg Oral Given 03/14/22 [id number])  spironolactone (ALDACTONE) tablet 12.5 mg (12.5 mg Oral Given 03/14/22 0927)  enoxaparin (LOVENOX) injection 40 mg (40 mg Subcutaneous Given 03/14/22 0928)  acetaminophen (TYLENOL) tablet 650 mg (has no administration in time range)  prochlorperazine (COMPAZINE) injection 5 mg (has no administration in time range)  polyethylene glycol (MIRALAX / GLYCOLAX) packet 17 g (has no administration in time range)  melatonin tablet 5 mg (has no administration in time range)    Mobility walks     Focused Assessments Neuro Assessment Handoff:  Swallow screen pass? Yes  Cardiac Rhythm: Ventricular paced NIH Stroke Scale  Dizziness Present: No Headache Present: No Level of Consciousness (1a.)   : Alert, keenly responsive LOC Questions (1b. )   : Answers both questions correctly LOC Commands (1c. )   : Performs both tasks correctly Best Gaze (2. )  : Normal Visual (3. )  : No visual loss Facial Palsy (4. )    : Normal symmetrical movements Motor Arm, Left (5a. )   : No drift Motor Arm, Right (5b. ) : No drift Motor Leg, Left (6a.  )  : No drift Motor Leg, Right (6b. ) : No  drift Limb Ataxia (7. ): Absent Sensory (8. )  : Normal, no sensory loss Best Language (9. )  : No aphasia Dysarthria (10. ): Normal Extinction/Inattention (11.)   : No Abnormality Complete NIHSS TOTAL: 0     Neuro Assessment: Within Defined Limits Neuro Checks:      Has TPA been given? No If patient is a Neuro Trauma and patient is going to OR before floor call report to [facility] nurse: [phone] or [phone]   R Recommendations: See Admitting Provider Note  Report given to:   Additional Notes:

## 2022-03-14 NOTE — ED Notes (Signed)
Patient transported to MRI with RN and transport

## 2022-03-14 NOTE — Evaluation (Signed)
Physical Therapy Evaluation Patient Details Name: Lindsay Camacho MRN: [id number] DOB: 1965-11-14 Today's Date: 03/14/2022  History of Present Illness  57 yo female presents with chest pain and slurred speech. MRI demonstrates L corona radiata CVA. PMH CAD, cardiomyopathy s/p cardiac defibrillation and pacemake, smoker, TIA 2018  Clinical Impression  Pt presents to PT with mild deficits in endurance, otherwise reports to be close to her baseline. Pt reports much improvement compared to session earlier this morning with OT, and states that she does have some trouble with her mobility and endurance in the morning at baseline. Pt will benefit from continued acute PT services in an effort to improve endurance and to restore independence.     Recommendations for follow up therapy are one component of a multi-disciplinary discharge planning process, led by the attending physician.  Recommendations may be updated based on patient status, additional functional criteria and insurance authorization.  Follow Up Recommendations Outpatient PT      Assistance Recommended at Discharge PRN  Patient can return home with the following  A little help with bathing/dressing/bathroom;Assistance with cooking/housework;Help with stairs or ramp for entrance    Equipment Recommendations None recommended by PT  Recommendations for Other Services       Functional Status Assessment Patient has had a recent decline in their functional status and demonstrates the ability to make significant improvements in function in a reasonable and predictable amount of time.     Precautions / Restrictions Precautions Precautions: Fall Restrictions Weight Bearing Restrictions: No      Mobility  Bed Mobility Overal bed mobility: Independent                  Transfers Overall transfer level: Independent                      Ambulation/Gait Ambulation/Gait assistance: Supervision Gait Distance (Feet):  300 Feet Assistive device: None Gait Pattern/deviations: Step-through pattern Gait velocity: functional Gait velocity interpretation: 1.31 - 2.62 ft/sec, indicative of limited community ambulator   General Gait Details: steady step-through gait, pt denies significant SOB  Stairs            Wheelchair Mobility    Modified Rankin (Stroke Patients Only)       Balance Overall balance assessment: Needs assistance Sitting-balance support: No upper extremity supported, Feet supported Sitting balance-Leahy Scale: Good     Standing balance support: No upper extremity supported, During functional activity Standing balance-Leahy Scale: Good                               Pertinent Vitals/Pain Pain Assessment Pain Assessment: No/denies pain    Home Living Family/patient expects to be discharged to:: Private residence Living Arrangements: Alone Available Help at Discharge: Family;Available PRN/intermittently ("uncle Fritz Pickerel") Type of Home: House Home Access: Stairs to enter Entrance Stairs-Rails: None Entrance Stairs-Number of Steps: 1   Home Layout: One level Home Equipment: Conservation officer, nature (2 wheels)      Prior Function Prior Level of Function : Independent/Modified Independent;Driving                     Hand Dominance        Extremity/Trunk Assessment   Upper Extremity Assessment Upper Extremity Assessment: Overall WFL for tasks assessed    Lower Extremity Assessment Lower Extremity Assessment: Overall WFL for tasks assessed    Cervical / Trunk Assessment Cervical / Trunk Assessment: Normal  Communication   Communication: No difficulties  Cognition Arousal/Alertness: Awake/alert Behavior During Therapy: WFL for tasks assessed/performed Overall Cognitive Status: Within Functional Limits for tasks assessed                                          General Comments General comments (skin integrity, edema, etc.): VSS on  RA, sats in mid 90s when mobilizing    Exercises     Assessment/Plan    PT Assessment Patient needs continued PT services  PT Problem List Decreased activity tolerance;Cardiopulmonary status limiting activity       PT Treatment Interventions DME instruction;Gait training;Stair training;Therapeutic exercise;Therapeutic activities;Balance training;Patient/family education    PT Goals (Current goals can be found in the Care Plan section)  Acute Rehab PT Goals Patient Stated Goal: to go home PT Goal Formulation: With patient Time For Goal Achievement: 03/28/22 Potential to Achieve Goals: Good Additional Goals Additional Goal #1: Pt will score >19/24 on the DGI to indicate a reduced risk for falls Additional Goal #2: Pt will score >45/56 on the BERG balance test to indicate a reduced risk for falls    Frequency Min 3X/week     Co-evaluation               AM-PAC PT "6 Clicks" Mobility  Outcome Measure Help needed turning from your back to your side while in a flat bed without using bedrails?: None Help needed moving from lying on your back to sitting on the side of a flat bed without using bedrails?: None Help needed moving to and from a bed to a chair (including a wheelchair)?: None Help needed standing up from a chair using your arms (e.g., wheelchair or bedside chair)?: None Help needed to walk in hospital room?: A Little Help needed climbing 3-5 steps with a railing? : A Little 6 Click Score: 22    End of Session   Activity Tolerance: Patient tolerated treatment well Patient left: in bed;with call bell/phone within reach Nurse Communication: Mobility status PT Visit Diagnosis: Other abnormalities of gait and mobility (R26.89)    Time: 1434-1450 PT Time Calculation (min) (ACUTE ONLY): 16 min   Charges:   PT Evaluation $PT Eval Low Complexity: [place], PT, DPT Acute Rehabilitation Office [phone]   Zenaida Niece 03/14/2022, 3:35  PM

## 2022-03-15 ENCOUNTER — Observation Stay (HOSPITAL_COMMUNITY): Payer: Medicare HMO

## 2022-03-15 DIAGNOSIS — I639 Cerebral infarction, unspecified: Secondary | ICD-10-CM | POA: Diagnosis not present

## 2022-03-15 DIAGNOSIS — R4781 Slurred speech: Secondary | ICD-10-CM | POA: Diagnosis not present

## 2022-03-15 LAB — GLUCOSE, CAPILLARY: Glucose-Capillary: 90 mg/dL (ref 70–99)

## 2022-03-15 LAB — HEMOGLOBIN A1C
Hgb A1c MFr Bld: 5.2 % (ref 4.8–5.6)
Mean Plasma Glucose: 103 mg/dL

## 2022-03-15 MED ORDER — IOHEXOL 350 MG/ML SOLN
75.0000 mL | Freq: Once | INTRAVENOUS | Status: AC | PRN
  Administered 2022-03-15: 75 mL via INTRAVENOUS

## 2022-03-15 MED ORDER — OXYCODONE-ACETAMINOPHEN 5-325 MG PO TABS
1.0000 | ORAL_TABLET | Freq: Four times a day (QID) | ORAL | Status: DC | PRN
  Administered 2022-03-15 (×2): 1 via ORAL
  Filled 2022-03-15 (×2): qty 1

## 2022-03-15 MED ORDER — STROKE: EARLY STAGES OF RECOVERY BOOK
Freq: Once | Status: AC
  Filled 2022-03-15: qty 1

## 2022-03-15 NOTE — Progress Notes (Signed)
Occupational Therapy Treatment Patient Details Name: Lindsay Camacho MRN: [id number] DOB: 12-Mar-1965 Today's Date: 03/15/2022   History of present illness 57 yo female presents with chest pain and slurred speech. MRI demonstrates L corona radiata CVA. PMH CAD, cardiomyopathy s/p cardiac defibrillation and pacemake, smoker, TIA 2018   OT comments  Limited visit on this date due to patient having episodes of dizziness without nausea. Patient was able to get to EOB and ambulate to sink without an assistive device and no assistance. Patient able to stand at sink to perform self care tasks and following ~5 minutes began having complaints of dizziness and asked to sit. BP checked while on EOB with 158/88. After short rest patient stood from EOB for a standing BP reading with 164/85 and increased dizziness. Patient returned to supine and BP was 172/80.  Nursing aware. Patient appears unsafe to return home alone at this time. Acute OT to continue to follow.    Recommendations for follow up therapy are one component of a multi-disciplinary discharge planning process, led by the attending physician.  Recommendations may be updated based on patient status, additional functional criteria and insurance authorization.    Follow Up Recommendations  Skilled nursing-short term rehab (<3 hours/day) (Pending progress with therapy -)     Assistance Recommended at Discharge Set up Supervision/Assistance  Patient can return home with the following  A little help with walking and/or transfers;A little help with bathing/dressing/bathroom   Equipment Recommendations  Other (comment) (rollator)    Recommendations for Other Services      Precautions / Restrictions Precautions Precautions: Fall Restrictions Weight Bearing Restrictions: No       Mobility Bed Mobility Overal bed mobility: Independent             General bed mobility comments: no assistance needed    Transfers Overall transfer level:  Independent                 General transfer comment: supervision due to dizziness     Balance Overall balance assessment: Needs assistance Sitting-balance support: No upper extremity supported, Feet supported Sitting balance-Leahy Scale: Good     Standing balance support: No upper extremity supported, During functional activity Standing balance-Leahy Scale: Good Standing balance comment: required no support when standing initially. Supervision after complaints of dizziness                           ADL either performed or assessed with clinical judgement   ADL Overall ADL's : Needs assistance/impaired     Grooming: Wash/dry hands;Wash/dry face;Brushing hair;Supervision/safety;Standing Grooming Details (indicate cue type and reason): began standing at sink for grooming and began complaining of dizziness before completing                               General ADL Comments: limited session due to dizziness    Extremity/Trunk Assessment              Vision       Perception     Praxis      Cognition Arousal/Alertness: Awake/alert Behavior During Therapy: WFL for tasks assessed/performed Overall Cognitive Status: Within Functional Limits for tasks assessed                                 General Comments: episodes of dizziness this treatment session  when standing        Exercises      Shoulder Instructions       General Comments BP seated after compaints of dizziness 158/88, when standing after short rest 164/85, in supine at end of session 172/80. Nursing aware    Pertinent Vitals/ Pain       Pain Assessment Pain Assessment: No/denies pain  Home Living                                          Prior Functioning/Environment              Frequency  Min 2X/week        Progress Toward Goals  OT Goals(current goals can now be found in the care plan section)  Progress towards OT  goals: Progressing toward goals  Acute Rehab OT Goals Patient Stated Goal: go home OT Goal Formulation: With patient Time For Goal Achievement: 03/28/22 Potential to Achieve Goals: Good ADL Goals Pt Will Transfer to Toilet: with modified independence;ambulating;regular height toilet Additional ADL Goal #1: pt will demonstrate 2 energy conservation strategies for home mod i Additional ADL Goal #2: pt will demonstrate 5 minutes of adls mod I with sustain activity tolerance  Plan Discharge plan remains appropriate    Co-evaluation                 AM-PAC OT "6 Clicks" Daily Activity     Outcome Measure   Help from another person eating meals?: None Help from another person taking care of personal grooming?: None Help from another person toileting, which includes using toliet, bedpan, or urinal?: A Little Help from another person bathing (including washing, rinsing, drying)?: A Little Help from another person to put on and taking off regular upper body clothing?: None Help from another person to put on and taking off regular lower body clothing?: None 6 Click Score: 22    End of Session Equipment Utilized During Treatment: Gait belt  OT Visit Diagnosis: Unsteadiness on feet (R26.81)   Activity Tolerance Other (comment) (patient with complaints of dizziness)   Patient Left in bed;with call bell/phone within reach;with bed alarm set;with nursing/sitter in room   Nurse Communication Mobility status;Precautions;Other (comment) (Dizziness and BP readings)        Time: [id number] OT Time Calculation (min): 27 min  Charges: OT General Charges $OT Visit: 1 Visit OT Treatments $Self Care/Home Management : 8-22 mins $Therapeutic Activity: 8-22 mins  Lodema Hong, [place]  Office [phone]   Trixie Dredge 03/15/2022, 1:16 PM

## 2022-03-15 NOTE — Progress Notes (Signed)
Speech Language Pathology Evaluation Patient Details Name: Lindsay Camacho MRN: [id number] DOB: [birth year] Today's Date: 03/15/2022 Time: 1340-1403 SLP Time Calculation (min) (ACUTE ONLY): 23 min  Problem List:  Patient Active Problem List   Diagnosis Date Noted   Acute stroke due to ischemia (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/21/2021   Subclavian arterial stenosis (HCC) 08/28/2021   Chest pain, rule out acute myocardial infarction 07/22/2018   ICD (implantable cardioverter-defibrillator) in place 01/26/2017   Nonischemic cardiomyopathy ([place]) 01/26/2017   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/25/2016   Stenosis of left subclavian artery ([place]) 08/22/2016   Aortoiliac occlusive disease ([place]) 08/22/2016   Acute on chronic systolic CHF (congestive heart failure) ([place])    Mitral regurgitation 08/17/2016   Heart failure ([place]) 08/17/2016   Shortness of breath    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   Diarrhea 10/05/2014   Syncope 123456   Chronic systolic CHF (congestive heart failure) ([place]) 01/12/2014   Chest pain, atypical, muscular skelatal   [id number]   Diastolic CHF, acute ([place]) 01/08/2014   Chest pain at rest 01/08/2014   Congestive heart disease ([place])    Post-op pain-Right groin area 05/27/2013   Drainage from wound-Groin area 05/27/2013   Retroperitoneal hematoma 05/01/2013   Retroperitoneal bleed 04/15/2013   Anemia due to blood loss 04/15/2013   Hyperlipidemia with target LDL less than 70 04/15/2013   Intermediate coronary syndrome ([place])    Chest pain 04/11/2013   CAD - CFX DES 01/17/13 01/20/2013   Rash post PCI 01/20/2013   NSTEMI (non-ST elevated myocardial infarction) ([place]) 01/17/2013   Chest pain, musculoskeletal 10/07/2012   HTN (hypertension) 10/07/2012   LBBB (left bundle branch block) 10/07/2012   Tobacco abuse 10/07/2012   Past Medical History:   Past Medical History:  Diagnosis Date   Acute myocardial infarction of other lateral wall, initial episode of care 01/2013   DES CFX   Aortoiliac occlusive disease ([place]) 08/22/2016   CAD (coronary artery disease) 11/13/2011   50% ? 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 pacemaker    PVD (peripheral vascular disease) ([place])    Right CFA stenosis per report on 11/14/2011   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/25/2016   Edwards McArthy-Adams IMR ETLogix ring annuloplasty (Model 4100, Serial [id number], size 26) placed via right mini thoracotomy approach   Stenosis of left subclavian artery ([place]) 08/22/2016   Stroke ([place])    tia with no deficits   Past Surgical History:  Past Surgical History:  Procedure Laterality Date   AORTIC ARCH ANGIOGRAPHY N/A 09/21/2021   Procedure: AORTIC ARCH ANGIOGRAPHY;  Surgeon: Wellington Hampshire, MD;  Location: [place] [facility];  Service: Cardiovascular;  Laterality: N/A;   APPENDECTOMY     BIV ICD INSERTION CRT-D N/A 01/24/2017   Procedure: BIV ICD INSERTION CRT-D;  Surgeon: Evans Lance, MD;  Location: [place] [facility];  Service: Cardiovascular;  Laterality: N/A;   CARDIAC CATHETERIZATION  04/14/2013   Non-obstructive disease, patent CFX stent  CARDIAC CATHETERIZATION N/A 09/21/2014   Procedure: Left Heart Cath and Coronary Angiography;  Surgeon: Troy Sine, MD;  Location: [place] [facility];  Service: Cardiovascular;  Laterality: N/A;   CORONARY ANGIOPLASTY WITH STENT PLACEMENT  01/17/2013   mild disease except 99% CFX, rx with  2.5 x 28 Alpine drug-eluting stent    GROIN  DEBRIDEMENT Right 04/14/2013   Procedure: Emergency Evacuation of Retroperitoneal Hematoma and Repair Right External Iliac Artery Pseudoaneurysm    ;  Surgeon: Elam Dutch, MD;  Location: [place];  Service: Vascular;  Laterality: Right;   LEFT HEART CATHETERIZATION WITH CORONARY ANGIOGRAM N/A 01/18/2013   Procedure: LEFT HEART CATHETERIZATION WITH CORONARY ANGIOGRAM;  Surgeon: Jettie Booze, MD;  Location: [facility] CATH LAB;  Service: Cardiovascular;  Laterality: N/A;   LEFT HEART CATHETERIZATION WITH CORONARY ANGIOGRAM N/A 04/14/2013   Procedure: LEFT HEART CATHETERIZATION WITH CORONARY ANGIOGRAM;  Surgeon: Jettie Booze, MD;  Location: [facility] CATH LAB;  Service: Cardiovascular;  Laterality: N/A;   MITRAL VALVE REPAIR Right 08/25/2016   Procedure: MINIMALLY INVASIVE MITRAL VALVE REPAIR;  Surgeon: Rexene Alberts, MD;  Location: [place];  Service: Open Heart Surgery;  Laterality: Right;   MULTIPLE EXTRACTIONS WITH ALVEOLOPLASTY N/A 08/23/2016   Procedure: Extraction of tooth #'s 17, 20-23, 26-29 with alveoloplasty;  Surgeon: Lenn Cal, DDS;  Location: [place];  Service: Oral Surgery;  Laterality: N/A;   PERCUTANEOUS CORONARY STENT INTERVENTION (PCI-S)  01/18/2013   Procedure: PERCUTANEOUS CORONARY STENT INTERVENTION (PCI-S);  Surgeon: Jettie Booze, MD;  Location: [facility] CATH LAB;  Service: Cardiovascular;;   RIGHT/LEFT HEART CATH AND CORONARY ANGIOGRAPHY N/A 08/21/2016   Procedure: RIGHT/LEFT HEART CATH AND CORONARY ANGIOGRAPHY;  Surgeon: Burnell Blanks, MD;  Location: [place] [facility];  Service: Cardiovascular;  Laterality: N/A;   TEE WITHOUT CARDIOVERSION N/A 03/06/2014   Procedure: TRANSESOPHAGEAL ECHOCARDIOGRAM (TEE);  Surgeon: Dorothy Spark, MD;  Location: [place];  Service: Cardiovascular;  Laterality: N/A;   TEE WITHOUT CARDIOVERSION N/A 08/18/2016   Procedure: TRANSESOPHAGEAL ECHOCARDIOGRAM (TEE);  Surgeon: Lelon Perla, MD;  Location: [facility] ENDOSCOPY;  Service:  Cardiovascular;  Laterality: N/A;   TEE WITHOUT CARDIOVERSION N/A 08/25/2016   Procedure: TRANSESOPHAGEAL ECHOCARDIOGRAM (TEE);  Surgeon: Rexene Alberts, MD;  Location: [place];  Service: Open Heart Surgery;  Laterality: N/A;   TUBAL LIGATION     UPPER EXTREMITY INTERVENTION  09/21/2021   Procedure: UPPER EXTREMITY INTERVENTION;  Surgeon: Wellington Hampshire, MD;  Location: [place] CV LAB;  Service: Cardiovascular;;   HPI:  Lindsay Camacho is a 57 y.o. female with medical history significant for coronary artery disease, cardiomyopathy, status post cardiac defibrillator and pacemaker, ongoing tobacco abuse, who presented to the ED with complaints of palpitations and chest pain, and slurred speech for several days. CT age-indeterminate left white matter infarct without hemorrhage. MRI, 03/14/22 "8 mm acute infarction in the left corona radiata. Otherwise normal  study."   Assessment / Plan / Recommendation Clinical Impression  Pt seen for speech/language evaluation via informal means and portions of Western Aphasia Battery Revised (Bedside Record Form).   Pt presents with s/sx moderate dysarthria c/b perceptual characteristics of significantly reduced vocal loudness and mild articulatory imprecision which affected pt's speech intelligibility across all levels. Pt approx 40-50% intelligible to an unfamiliar listener at the conversation level. Speech intelligibility may be affected by edentulism. Pt benefited from cues to speak louder and slower to improve speech intelligibility. Noted emerging use of strategies when pt conversing  with her pastor via telephone. Pt with intact basic auditory comprehension. Mild difficulty noted with complex yes/no questions (4/5). Pt with intact basic verbal expression for naming tasks (e.g. confrontation naming, generative naming) and repetition. However, during picture description task, pt tended to speak in short phrases (2-3 words) despite cues for elaboration. Further  assessment of verbal expression may be indicated. Pt emotionally labile. Pt with impaired pragmatics as evidenced by flat affect. A full cognitive-linguistic assessment was not performed given speech/language evaluation. Noted pt A&Ox4 and was able to recall/demo use of call bell. Full cognitive-linguistic assessment deferred at this time as pt became increasingly labile during session. Will continue to monitor for need for further assessment as pt lives alone.    SLP Assessment  SLP Recommendation/Assessment: Patient needs continued Speech Lanaguage Pathology Services SLP Visit Diagnosis: Dysarthria and anarthria (R47.1);Cognitive communication deficit (R41.841)    Recommendations for follow up therapy are one component of a multi-disciplinary discharge planning process, led by the attending physician.  Recommendations may be updated based on patient status, additional functional criteria and insurance authorization.    Follow Up Recommendations  Home health SLP (per pt preference, per TOC note; post-acute SLP services warranted)    Assistance Recommended at Discharge   (defer to OT/PT)     Frequency and Duration min 2x/week  2 weeks      SLP Evaluation Cognition  Overall Cognitive Status: Within Functional Limits for tasks assessed Arousal/Alertness: Awake/alert Orientation Level: Oriented X4 Problem Solving: Appears intact (non-verbal (e.g. use of call bell))       Comprehension  Auditory Comprehension Overall Auditory Comprehension: Impaired Yes/No Questions: Impaired Basic Immediate Environment Questions: 75-100% accurate Complex Questions: 75-100% accurate Commands: Within Functional Limits Conversation: Simple (appeared [facility]) Interfering Components: Visual impairments Visual Recognition/Discrimination Discrimination: Not tested Reading Comprehension Reading Status: Not tested    Expression Expression Primary Mode of Expression: Verbal Verbal Expression Overall Verbal  Expression: Impaired Initiation: No impairment Automatic Speech: Social Response (WFL) Level of Generative/Spontaneous Verbalization: Sentence;Conversation (during picture description task, pt with tended to speak in short phrases (2-3 words) despite cues for elaboration) Repetition: No impairment Naming: No impairment (confrontation, generative) Pragmatics: Impairment Impairments: Abnormal affect (labile at times) Interfering Components: Speech intelligibility   Oral / Motor  Oral Motor/Sensory Function Overall Oral Motor/Sensory Function: Mild impairment Facial ROM:  (slight R facial droop; symmetrical with retraction) Facial Symmetry: Abnormal symmetry right (slight R facial droop; symmetrical with retraction; no other functional deficit appreciated) Motor Speech Overall Motor Speech: Impaired Respiration: Impaired Level of Impairment:  (across all levels) Phonation: Low vocal intensity Resonance: Within functional limits Articulation: Impaired Level of Impairment:  (across all levels) Intelligibility: Intelligibility reduced Word: 25-49% accurate Phrase: 25-49% accurate Sentence: 25-49% accurate Conversation: 25-49% accurate Motor Planning: Witnin functional limits Motor Speech Errors: Not applicable Interfering Components: Inadequate dentition Effective Techniques: Slow rate;Increased vocal intensity   Cherrie Gauze, M.S., [place] Pathologist Secure Chat Preferred  O: [phone]          Quintella Baton 03/15/2022, 3:11 PM

## 2022-03-15 NOTE — Consult Note (Signed)
Neurology Consultation  Reason for Consult: Stroke Referring Physician: Dr Sloan Leiter  CC: Slurred speech  History is obtained from: Patient, chart  HPI: Lindsay Camacho is a 57 y.o. female past medical history of CAD, hypertension, hyperlipidemia, tobacco abuse, peripheral vascular disease, prior TIA with no residual deficits, heart failure with preserved ejection fraction, status post pacemaker placement, presented to the emergency room for evaluation of palpitations, chest pain and slurred speech that have been going on for several days.  She was admitted for further workup, seen by cardiology in consultation, and had brain imaging done because of the complaints of slurred speech that showed a 8 mm acute infarction in the left coronary artery for which neurology consultation was obtained. She reports that the speech is now nearly back to baseline but still does not feel 100% back to baseline. When I examined her, she was working with PT and got extremely lightheaded and dizzy upon trying to get up from bed. Orthostatic vitals are in progress.    LKW: Multiple days ago-unclear IV thrombolysis given?: no, unclear last known well Premorbid modified Rankin scale (mRS): 1   ROS: Full ROS was performed and is negative except as noted in the HPI.  Past Medical History:  Diagnosis Date   Acute myocardial infarction of other lateral wall, initial episode of care 01/2013   DES CFX   Aortoiliac occlusive disease ([place]) 08/22/2016   CAD (coronary artery disease) 11/13/2011   50% ? 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/25/2016   Edwards McArthy-Adams IMR ETLogix ring annuloplasty (Model 4100, Serial [id number], size 73) placed via right mini thoracotomy approach   Stenosis of left subclavian artery ([place]) 08/22/2016   Stroke ([place])    tia with no deficits     Family History  Problem Relation Age of Onset   CAD Mother 86   Lung cancer Mother    Bladder Cancer Mother    Stroke Mother    Heart disease Mother        Before age 35   Hypertension Mother    Heart attack Mother    CAD Father 58   Heart disease Father        Before age 71   Heart attack Father    Stroke Father        Bleeding stroke     Social History:   reports that she has been smoking cigarettes. She has been smoking an average of .5 packs per day. She has never used smokeless tobacco. She reports that she does not currently use drugs after having used the following drugs: Marijuana. She reports that she does not drink alcohol.  Medications  Current Facility-Administered Medications:    [START ON 03/16/2022]  stroke: early stages of recovery book, , Does not apply, Once, Barb Merino, MD   acetaminophen (TYLENOL) tablet 650 mg, 650 mg, Oral, Q6H PRN, Kayleen Memos, DO   aspirin EC tablet 81  mg, 81 mg, Oral, Daily, Kayleen Memos, DO, 81 mg at 03/15/22 1016   atorvastatin (LIPITOR) tablet 80 mg, 80 mg, Oral, Daily, Irene Pap N, DO, 80 mg at 03/15/22 1016   clopidogrel (PLAVIX) tablet 75 mg, 75 mg, Oral, Daily, Hall, Carole N, DO, 75 mg at 03/15/22 1016   enoxaparin (LOVENOX) injection 40 mg, 40 mg, Subcutaneous, Daily, Hall, Carole N, DO, 40 mg at 03/15/22 1015   melatonin tablet 5 mg, 5 mg, Oral, QHS PRN, Hall, Carole N, DO   pantoprazole (PROTONIX) EC tablet 40 mg, 40 mg, Oral, Daily, Hall, Carole N, DO, 40 mg at 03/15/22 1016   polyethylene glycol (MIRALAX / GLYCOLAX) packet 17 g, 17 g, Oral,  Daily PRN, [place] Crane, Carole N, DO   prochlorperazine (COMPAZINE) injection 5 mg, 5 mg, Intravenous, Q6H PRN, Hall, Carole N, DO   spironolactone (ALDACTONE) tablet 12.5 mg, 12.5 mg, Oral, Daily, Hall, Carole N, DO, 12.5 mg at 03/15/22 1016   Exam: Current vital signs: BP (!) 155/91 (BP Location: Right Arm)   Pulse 66   Temp 98.2 F (36.8 C)   Resp 16   Ht '5\' 1"'$  (1.549 m)   Wt 46.7 kg   SpO2 100%   BMI 19.46 kg/m  Vital signs in last 24 hours: Temp:  [97.5 F (36.4 C)-99.2 F (37.3 C)] 98.2 F (36.8 C) (03/13 0846) Pulse Rate:  [57-73] 66 (03/13 0846) Resp:  [12-20] 16 (03/13 0846) BP: (136-168)/(63-91) 155/91 (03/13 0846) SpO2:  [96 %-100 %] 100 % (03/13 0846) GENERAL: Awake, alert in NAD HEENT: - Normocephalic and atraumatic, dry mm, no LN++, no Thyromegally LUNGS - Clear to auscultation bilaterally with no wheezes CV - S1S2 RRR, no m/r/g, equal pulses bilaterally. ABDOMEN - Soft, nontender, nondistended with normoactive BS Ext: warm, well perfused, intact peripheral pulses, no edema  NEURO:  Mental Status: AA&Ox3  Language: speech is mildly dysarthric.  Naming, repetition, fluency, and comprehension intact. Cranial Nerves: PERRL EOMI, visual fields full, no facial asymmetry,facial sensation intact, hearing intact, tongue/uvula/soft palate midline, normal sternocleidomastoid and trapezius muscle strength. No evidence of tongue atrophy or fibrillations Motor: 5/5 in all fours without drift Tone: is normal and bulk is normal Sensation- Intact to light touch bilaterally Coordination: FTN intact bilaterally, no ataxia in BLE. Gait- deferred  NIHSS-1  Labs I have reviewed labs in epic and the results pertinent to this consultation are: CBC    Component Value Date/Time   WBC 6.0 03/14/2022 0251   RBC 5.26 (H) 03/14/2022 0251   HGB 15.4 (H) 03/14/2022 0251   HGB 14.8 07/05/2011 1145   HCT 46.9 (H) 03/14/2022 0251   HCT 44.3 07/05/2011 1145   PLT 169 03/14/2022 0251    PLT 178 07/05/2011 1145   MCV 89.2 03/14/2022 0251   MCV 91 07/05/2011 1145   MCH 29.3 03/14/2022 0251   MCHC 32.8 03/14/2022 0251   RDW 13.8 03/14/2022 0251   RDW 14.1 07/05/2011 1145   LYMPHSABS 2.7 03/13/2022 1728   MONOABS 0.8 03/13/2022 1728   EOSABS 0.2 03/13/2022 1728   BASOSABS 0.1 03/13/2022 1728   CMP     Component Value Date/Time   NA 138 03/14/2022 0251   NA 144 09/07/2020 1414   NA 138 07/05/2011 1145   K 3.9 03/14/2022 0251   K 3.8 07/05/2011 1145   CL 108 03/14/2022 0251   CL 109 (H) 07/05/2011 1145   CO2 24 03/14/2022 0251   CO2 25 07/05/2011 1145   GLUCOSE 92  03/14/2022 0251   GLUCOSE 81 07/05/2011 1145   BUN 13 03/14/2022 0251   BUN 9 09/07/2020 1414   BUN 15 07/05/2011 1145   CREATININE 0.89 03/14/2022 0251   CREATININE 0.85 10/27/2015 1430   CALCIUM 8.9 03/14/2022 0251   CALCIUM 8.4 (L) 07/05/2011 1145   PROT 7.0 03/05/2022 1136   PROT 7.0 09/07/2020 1414   PROT 7.6 07/05/2011 1145   ALBUMIN 3.5 03/05/2022 1136   ALBUMIN 4.3 09/07/2020 1414   ALBUMIN 3.6 07/05/2011 1145   AST 25 03/05/2022 1136   AST 22 07/05/2011 1145   ALT 16 03/05/2022 1136   ALT 15 07/05/2011 1145   ALKPHOS 85 03/05/2022 1136   ALKPHOS 86 07/05/2011 1145   BILITOT 0.7 03/05/2022 1136   BILITOT 0.8 09/07/2020 1414   BILITOT 0.5 07/05/2011 1145   GFRNONAA >60 03/14/2022 0251   GFRNONAA >60 07/05/2011 1145   GFRAA 87 03/07/2019 0923   GFRAA >60 07/05/2011 1145   Lipid Panel     Component Value Date/Time   CHOL 163 03/14/2022 0528   CHOL 132 03/07/2019 0923   TRIG 87 03/14/2022 0528   HDL 34 (L) 03/14/2022 0528   HDL 40 03/07/2019 0923   CHOLHDL 4.8 03/14/2022 0528   VLDL 17 03/14/2022 0528   LDLCALC 112 (H) 03/14/2022 0528   LDLCALC 79 03/07/2019 0923  A1c 5.2 LDL-112  Imaging I have reviewed the images obtained  MRI examination of the brain shows 8 mm left corona radiata acute ischemic infarct.  Assessment:  57 year old with above past medical history  presenting for multiple days of slurred speech, palpitations and dizziness, noted to have a small coronary radiata acute infarct in the left corona radiata. Location is very suggestive of a small vessel etiology but cardioembolic source cannot be completely ruled out. Needs completed workup.  Impression: Acute ischemic stroke-likely small vessel etiology but needs workup to rule out embolic source  Recommendations: Frequent neurochecks Telemetry CT angio head and neck 2D echo DAPT at home-continue for now. A1c at goal LDL above goal.  High intensity statin for LDL less than 70.  Currently on atorvastatin 80-continue for now. No need for permissive hypertension PT OT Speech therapy Stroke team to follow Plan discussed with Dr.  Sloan Leiter  -- Amie Portland, MD Neurologist [facility] Pager: [phone]

## 2022-03-15 NOTE — Care Management Obs Status (Signed)
[place] NOTIFICATION   Patient Details  Name: Lindsay Camacho MRN: [id number] Date of Birth: 07/11/1965   Medicare Observation Status Notification Given:  Yes    Pollie Friar, RN 03/15/2022, 12:47 PM

## 2022-03-15 NOTE — TOC Initial Note (Addendum)
Transition of Care [facility]) - Initial/Assessment Note    Patient Details  Name: Lindsay Camacho MRN: [id number] Date of Birth: 04-14-1965  [facility] of [facility] CM/SW Contact:    Pollie Friar, RN Phone Number: 03/15/2022, 1:26 PM  Clinical Narrative:                 Pt is from home alone. She has an "uncle" that can assist if needed. She says he can also assist with transportation. Pt manages her own medications and denies any issues.  Pt has recommendations for outpatient therapy. Pt prefers for [facility] to come to her home. Pt provided choice but has no preference. CM has sent the referral to [facility]. Awaiting them to review to see if able to accept.  Pt says her "uncle" will be the person to provide transport home when discharged. TOC following.  1431: [facility] has declined for [facility] services. They are not in network with pts insurance. CM has sent the referral to [facility] to see if they are able to accept.  Expected Discharge Plan: [place] Barriers to Discharge: Continued Medical Work up   Patient Goals and CMS Choice   CMS Medicare.gov Compare Post Acute Care list provided to:: Patient Choice offered to / list presented to : Patient      Expected Discharge Plan and Services   Discharge Planning Services: CM Consult Post Acute Care Choice: [place] arrangements for the past 2 months: Single Family Home                           HH Arranged: PT, OT          Prior Living Arrangements/Services Living arrangements for the past 2 months: Single Family Home Lives with:: Self Patient language and need for interpreter reviewed:: Yes Do you feel safe going back to the place where you live?: Yes        Care giver support system in place?: No (comment) Current home services: DME (walker/ shower seat) Criminal Activity/Legal Involvement Pertinent to Current Situation/Hospitalization: No - Comment as needed  Activities of Daily  Living Home Assistive Devices/Equipment: None ADL Screening (condition at time of admission) Patient's cognitive ability adequate to safely complete daily activities?: Yes Is the patient deaf or have difficulty hearing?: No Does the patient have difficulty seeing, even when wearing glasses/contacts?: No Does the patient have difficulty concentrating, remembering, or making decisions?: No Patient able to express need for assistance with ADLs?: Yes Does the patient have difficulty dressing or bathing?: No Independently performs ADLs?: Yes (appropriate for developmental age) Does the patient have difficulty walking or climbing stairs?: Yes Weakness of Legs: Both Weakness of Arms/Hands: Both  Permission Sought/Granted                  Emotional Assessment Appearance:: Appears older than stated age Attitude/Demeanor/Rapport: Engaged Affect (typically observed): Accepting Orientation: : Oriented to Self, Oriented to Place, Oriented to  Time, Oriented to Situation   Psych Involvement: No (comment)  Admission diagnosis:  Slurred speech [R47.81] 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] Patient Active Problem List   Diagnosis Date Noted   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   Slurred speech 03/13/2022   Left subclavian artery occlusion 09/21/2021   Subclavian arterial stenosis ([place]) 08/28/2021   Chest pain, rule out acute myocardial infarction 07/22/2018   ICD (implantable cardioverter-defibrillator) in place 01/26/2017   Nonischemic cardiomyopathy ([place]) 01/26/2017  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/25/2016   Stenosis of left subclavian artery ([place]) 08/22/2016   Aortoiliac occlusive disease ([place]) 08/22/2016   Acute on chronic systolic CHF (congestive heart failure) ([place])    Mitral regurgitation 08/17/2016   Heart failure ([place]) 08/17/2016   Shortness of breath    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   Diarrhea 10/05/2014   Syncope 03/09/2014    Chronic systolic CHF (congestive heart failure) ([place]) 01/12/2014   Chest pain, atypical, muscular skelatal   [id number]   Diastolic CHF, acute ([place]) 01/08/2014   Chest pain at rest 01/08/2014   Congestive heart disease ([place])    Post-op pain-Right groin area 05/27/2013   Drainage from wound-Groin area 05/27/2013   Retroperitoneal hematoma 05/01/2013   Retroperitoneal bleed 04/15/2013   Anemia due to blood loss 04/15/2013   Hyperlipidemia with target LDL less than 70 04/15/2013   Intermediate coronary syndrome ([place])    Chest pain 04/11/2013   CAD - CFX DES 01/17/13 01/20/2013   Rash post PCI 01/20/2013   NSTEMI (non-ST elevated myocardial infarction) ([place]) 01/17/2013   Chest pain, musculoskeletal 10/07/2012   HTN (hypertension) 10/07/2012   LBBB (left bundle branch block) 10/07/2012   Tobacco abuse 10/07/2012   PCP:  Merryl Hacker, No Pharmacy:   [facility] [address] [place] [address] Phone: [phone] Fax: [place], [place] [address] [place] [address] Phone: [phone] Fax: [phone]  [facility] [address] [place], [place], [address] [place] [address] Phone: [phone] Fax: [phone]     Social Determinants of Health (SDOH) Social History: SDOH Screenings   Food Insecurity: No Food Insecurity (03/14/2022)  Housing: Low Risk  (03/14/2022)  Transportation Needs: No Transportation Needs (03/14/2022)  Utilities: Not At Risk (03/14/2022)  Tobacco Use: High Risk (03/13/2022)   SDOH Interventions:     Readmission Risk Interventions     No data to display

## 2022-03-15 NOTE — Progress Notes (Signed)
Mobility Specialist: Progress Note   03/15/22 1641  Mobility  Activity Ambulated with assistance in hallway  Level of Assistance Minimal assist, patient does 75% or more  Assistive Device Front wheel walker  Distance Ambulated (ft) 200 ft (100'x2)  Activity Response Tolerated fair  Mobility Referral Yes  $Mobility charge 1 Mobility   During Mobility: 79 HR Post-Mobility: 63 HR  Pt received in the bed and agreeable to mobility. MinA to stand as well as during mobility for balance. C/o BLE stiffness/fatigue requiring x1 seated break. No c/o dizziness or SOB. Pt bracing using Rt hand to lean on hand rail x1 requiring cues to maintain both hands on the RW. Pt back to bed after session with call bell and phone at her side. Bed alarm is on.   Gideon Ananiah Maciolek Mobility Specialist Please contact via SecureChat or Rehab office at [phone]

## 2022-03-15 NOTE — Progress Notes (Signed)
PROGRESS NOTE    Lindsay Camacho  [id number] DOB: 10/11/1965 DOA: 03/13/2022 PCP: Pcp, No    Brief Narrative:  57 year old with history of coronary artery disease, hypertension, hyperlipidemia, smoker, peripheral vascular disease, prior history of TIA with no residual deficits, pacemaker who presents to the emergency room with palpitations, chest pain and slurred speech going on for several days.  Seen by cardiology in the emergency room and they adjusted her pacemaker settings.  A subsequent MRI found to have 8 mm acute infarct left corona radiata.  Admitted with acute stroke.   Assessment & Plan:   Acute ischemic stroke, likely small vessel cardioembolic source.  Likely atherosclerotic disease due to smoking. Clinical findings, slurred speech, palpitations and dizziness CT head findings, normal MRI of the brain, 8 mm left coronary radiata infarction CT angiogram head and neck, pending.  Recent history of right carotid stenting.  Continues to smoke. 2D echocardiogram, pending. Antiplatelet therapy, on aspirin and Plavix at home, continued. LDL 112.  On atorvastatin 80. Hemoglobin A1c, 5.2.  No intervention needed. DVT prophylaxis, Lovenox. Therapy recommendations, outpatient PT OT. Stroke workup.  Neurology follow-up.  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  Stable now.  Coronary artery disease: Currently stable.  On aspirin Plavix and Lipitor.  GERD: On PPI.  Smoking with severe atherosclerotic disease, recent carotid stenting: Counseled to quit.  Nicotine patch.     DVT prophylaxis: enoxaparin (LOVENOX) injection 40 mg Start: 03/14/22 1000 SCDs Start: 03/14/22 0242   Code Status: Full code Family Communication: None at the bedside Disposition Plan: Status is: Observation The patient will require care spanning > 2 midnights and should be moved to inpatient because: Found to have new stroke.  Stroke workup in progress.      Consultants:  Neurology  Procedures:  None  Antimicrobials:  None   Subjective: Patient seen and examined in the morning rounds.  No family at the bedside.  Patient tells me her speech is mostly improved but is still has some slurring.  Denies any focal weakness leg and arms.  She does have some leg pain.  She takes Percocet at home.  Objective: Vitals:   03/14/22 2319 03/15/22 0315 03/15/22 0846 03/15/22 1117  BP: (!) 163/63 (!) 154/68 (!) 155/91 (!) 152/74  Pulse: (!) 57 (!) 58 66 69  Resp: '12 12 16 16  '$ Temp: 97.8 F (36.6 C) 99.2 F (37.3 C) 98.2 F (36.8 C) (!) 97.5 F (36.4 C)  TempSrc:      SpO2: 99% 99% 100% 99%  Weight:      Height:        Intake/Output Summary (Last 24 hours) at 03/15/2022 1430 Last data filed at 03/15/2022 1000 Gross per 24 hour  Intake 480 ml  Output --  Net 480 ml   Filed Weights   03/14/22 0809  Weight: 46.7 kg    Examination:  General exam: Appears calm and comfortable  On room air.  No focal neurological deficits. Respiratory system: Clear to auscultation. Respiratory effort normal. Cardiovascular system: S1 & S2 heard, RRR. No JVD, murmurs, rubs, gallops or clicks. No pedal edema. Gastrointestinal system: Abdomen is nondistended, soft and nontender. No organomegaly or masses felt. Normal bowel sounds heard. Central nervous system: Alert and oriented. No focal neurological deficits. Extremities: Symmetric 5 x 5 power.     Data Reviewed: I have personally reviewed following labs and imaging studies  CBC: Recent Labs  Lab 03/13/22 1728 03/14/22 0251  WBC 9.1 6.0  NEUTROABS 5.4  --   HGB 17.4* 15.4*  HCT 51.8* 46.9*  MCV 87.9 89.2  PLT 213 [id number]   Basic Metabolic Panel: Recent Labs  Lab 03/13/22 1728 03/14/22 0251  NA 138 138  K 4.4 3.9  CL 105 108  CO2 20* 24  GLUCOSE 95 92  BUN 15 13  CREATININE 0.97 0.89  CALCIUM 9.7 8.9  MG 1.8 1.7  PHOS  --  3.6   GFR: Estimated Creatinine Clearance: 52 mL/min  (by C-G formula based on SCr of 0.89 mg/dL). Liver Function Tests: No results for input(s): "AST", "ALT", "ALKPHOS", "BILITOT", "PROT", "ALBUMIN" in the last 168 hours. No results for input(s): "LIPASE", "AMYLASE" in the last 168 hours. No results for input(s): "AMMONIA" in the last 168 hours. Coagulation Profile: No results for input(s): "INR", "PROTIME" in the last 168 hours. Cardiac Enzymes: No results for input(s): "CKTOTAL", "CKMB", "CKMBINDEX", "TROPONINI" in the last 168 hours. BNP (last 3 results) No results for input(s): "PROBNP" in the last 8760 hours. HbA1C: Recent Labs    03/14/22 0528  HGBA1C 5.2   CBG: No results for input(s): "GLUCAP" in the last 168 hours. Lipid Profile: Recent Labs    03/14/22 0528  CHOL 163  HDL 34*  LDLCALC 112*  TRIG 87  CHOLHDL 4.8   Thyroid Function Tests: No results for input(s): "TSH", "T4TOTAL", "FREET4", "T3FREE", "THYROIDAB" in the last 72 hours. Anemia Panel: No results for input(s): "VITAMINB12", "FOLATE", "FERRITIN", "TIBC", "IRON", "RETICCTPCT" in the last 72 hours. Sepsis Labs: No results for input(s): "PROCALCITON", "LATICACIDVEN" in the last 168 hours.  No results found for this or any previous visit (from the past 240 hour(s)).       Radiology Studies: CT ANGIO HEAD NECK W WO CM  Result Date: 03/15/2022 CLINICAL DATA:  Complains of slurred speech EXAM: CT ANGIOGRAPHY HEAD AND NECK TECHNIQUE: Multidetector CT imaging of the head and neck was performed using the standard protocol during bolus administration of intravenous contrast. Multiplanar CT image reconstructions and MIPs were obtained to evaluate the vascular anatomy. Carotid stenosis measurements (when applicable) are obtained utilizing NASCET criteria, using the distal internal carotid diameter as the denominator. RADIATION DOSE REDUCTION: This exam was performed according to the departmental dose-optimization program which includes automated exposure control,  adjustment of the mA and/or kV according to patient size and/or use of iterative reconstruction technique. CONTRAST:  53m OMNIPAQUE IOHEXOL 350 MG/ML SOLN COMPARISON:  Brain MRI from yesterday FINDINGS: CT HEAD FINDINGS Brain: Small white matter infarct in the deep left cerebral white matter is subtle by CT. No visible progression. No hemorrhage, hydrocephalus, or collection. Vascular: No hyperdense vessel or unexpected calcification. Skull: Normal. Negative for fracture or focal lesion. Sinuses/Orbits: No acute finding. Review of the MIP images confirms the above findings CTA NECK FINDINGS Aortic arch: Atheromatous plaque with 3 vessel branching Right carotid system: Extensive calcified plaque at the brachiocephalic artery with some irregularity and 50% narrowing on sagittal reformats. Milder plaque at the carotid bifurcation without stenosis or ulceration Left carotid system: Mixed density plaque causing mild stenosis at the origin of the common carotid. Mixed density plaque at the carotid bifurcation not causing flow reducing stenosis. Vertebral arteries: Proximal left subclavian artery stent which is widely patent. Calcified plaque at both vertebral ostia causing 50% narrowing at the right on sagittal reformats. No dissection or beading seen, vertebral patency to the level of the dura. 60% narrowing at the right subclavian artery beyond the vertebral origin. Skeleton: No acute finding Other  neck: No acute finding Upper chest: Clear apical lungs Review of the MIP images confirms the above findings CTA HEAD FINDINGS Anterior circulation: Mild plaque on the carotid siphons. No branch occlusion, beading, or aneurysm. Posterior circulation: Vessels are smoothly contoured and widely patent. Negative for aneurysm or vascular malformation Venous sinuses: Negative Anatomic variants: Negative Review of the MIP images confirms the above findings IMPRESSION: No emergent arterial finding. Atherosclerosis especially  affecting the proximal vasculature: *60% right subclavian artery stenosis beyond the vertebral origin. *[id number] brachiocephalic artery stenosis at the origin. *50% narrowing at the right vertebral origin. Electronically Signed   By: Jorje Guild M.D.   On: 03/15/2022 11:42   MR BRAIN WO CONTRAST  Result Date: 03/14/2022 CLINICAL DATA:  Transient ischemic attack. EXAM: MRI HEAD WITHOUT CONTRAST TECHNIQUE: Multiplanar, multiecho pulse sequences of the brain and surrounding structures were obtained without intravenous contrast. COMPARISON:  Head CT yesterday. FINDINGS: Brain: 8 mm acute infarction in the left corona radiata. No other acute infarction. The brainstem and cerebellum are normal. Cerebral hemispheres otherwise are normal without evidence of other small or large vessel insult. No mass, hemorrhage, hydrocephalus or extra-axial collection. Vascular: Major vessels at the base of the brain show flow. Skull and upper cervical spine: Negative Sinuses/Orbits: Clear/normal Other: None IMPRESSION: 8 mm acute infarction in the left corona radiata. Otherwise normal study. Electronically Signed   By: Nelson Chimes M.D.   On: 03/14/2022 14:38   CT Head Wo Contrast  Result Date: 03/13/2022 CLINICAL DATA:  57 year old female with acute neuro deficit EXAM: CT HEAD WITHOUT CONTRAST TECHNIQUE: Contiguous axial images were obtained from the base of the skull through the vertex without intravenous contrast. RADIATION DOSE REDUCTION: This exam was performed according to the departmental dose-optimization program which includes automated exposure control, adjustment of the mA and/or kV according to patient size and/or use of iterative reconstruction technique. COMPARISON:  08/14/2021 CT and prior studies FINDINGS: Brain: A new 8 mm hypodensity within the white matter adjacent to the LEFT basal ganglia is compatible with age indeterminate infarct. There is no evidence of hemorrhage. There is no evidence of mass lesion or  mass effect, hydrocephalus, extra-axial collection or other infarct. Mild atrophy noted. Vascular: Carotid atherosclerotic calcifications are noted. Skull: Normal. Negative for fracture or focal lesion. Sinuses/Orbits: No acute finding. Other: None. IMPRESSION: 1. 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 Consider MRI for further evaluation as clinically indicated. 2. Mild atrophy. Electronically Signed   By: Margarette Canada M.D.   On: 03/13/2022 18:56   DG Chest Port 1 View  Result Date: 03/13/2022 CLINICAL DATA:  Tachycardia, chest pain, short of breath EXAM: PORTABLE CHEST 1 VIEW COMPARISON:  03/05/2022 FINDINGS: Single frontal view of the chest demonstrates multi lead pacer/AICD unchanged. Postsurgical changes of the mitral valve. Stable mild cardiomegaly. No acute airspace disease, effusion, or pneumothorax. Stable stent in the left subclavian distribution. No acute bony abnormality. IMPRESSION: 1. Stable chest, no acute process. Electronically Signed   By: Randa Ngo M.D.   On: 03/13/2022 18:38        Scheduled Meds:  [START ON 03/16/2022]  stroke: early stages of recovery book   Does not apply Once   aspirin EC  81 mg Oral Daily   atorvastatin  80 mg Oral Daily   clopidogrel  75 mg Oral Daily   enoxaparin (LOVENOX) injection  40 mg Subcutaneous Daily   pantoprazole  40 mg Oral Daily   spironolactone  12.5 mg Oral Daily   Continuous  Infusions:   LOS: 0 days    Time spent: 35 minutes     Barb Merino, MD [facility] Pager [phone]

## 2022-03-15 NOTE — Plan of Care (Signed)

## 2022-03-16 ENCOUNTER — Observation Stay (HOSPITAL_BASED_OUTPATIENT_CLINIC_OR_DEPARTMENT_OTHER): Payer: Medicare HMO

## 2022-03-16 DIAGNOSIS — R4781 Slurred speech: Secondary | ICD-10-CM | POA: Diagnosis not present

## 2022-03-16 DIAGNOSIS — I6389 Other cerebral infarction: Secondary | ICD-10-CM

## 2022-03-16 LAB — ECHOCARDIOGRAM COMPLETE
Area-P 1/2: 2.76 cm2
Height: 61 in
P 1/2 time: 476 msec
S' Lateral: 3.1 cm
Single Plane A4C EF: 43.8 %
Weight: 1648 oz

## 2022-03-16 MED ORDER — NICOTINE 21 MG/24HR TD PT24
21.0000 mg | MEDICATED_PATCH | Freq: Every day | TRANSDERMAL | Status: DC
  Administered 2022-03-16: 21 mg via TRANSDERMAL
  Filled 2022-03-16: qty 1

## 2022-03-16 MED ORDER — FUROSEMIDE 40 MG PO TABS: 40.0000 mg | ORAL_TABLET | Freq: Every day | ORAL | Status: DC | PRN

## 2022-03-16 MED ORDER — CARVEDILOL 6.25 MG PO TABS
6.2500 mg | ORAL_TABLET | Freq: Two times a day (BID) | ORAL | Status: DC
  Administered 2022-03-16: 6.25 mg via ORAL
  Filled 2022-03-16: qty 1

## 2022-03-16 MED ORDER — LOSARTAN POTASSIUM 25 MG PO TABS
25.0000 mg | ORAL_TABLET | Freq: Every day | ORAL | Status: DC
  Administered 2022-03-16: 25 mg via ORAL
  Filled 2022-03-16: qty 1

## 2022-03-16 NOTE — TOC Transition Note (Signed)
Transition of Care [facility]) - CM/SW Discharge Note   Patient Details  Name: Lindsay Camacho MRN: [id number] Date of Birth: April 25, 1965  [facility]) CM/SW Contact:  Pollie Friar, RN Phone Number: 03/16/2022, 11:50 AM   Clinical Narrative:    CM unable to arrange home health in [place] for patient as all the home health's in [place] are not in network with her insurance. CM talked with the patient and she is able to attend outpatient rehab. She says her "uncle" will assist with transportation. No new DME needs. Outpatient rehab arranged through [facility] per pt request. Information on the AVS.  Pt has transportation home when discharged.   Final next level of care: OP Rehab Barriers to Discharge: No Barriers Identified   Patient Goals and CMS Choice CMS Medicare.gov Compare Post Acute Care list provided to:: Patient Choice offered to / list presented to : Patient  Discharge Placement                         Discharge Plan and Services Additional resources added to the After Visit Summary for     Discharge Planning Services: CM Consult Post Acute Care Choice: Home Health                    HH Arranged: PT, OT          Social Determinants of Health (SDOH) Interventions SDOH Screenings   Food Insecurity: No Food Insecurity (03/14/2022)  Housing: Low Risk  (03/14/2022)  Transportation Needs: No Transportation Needs (03/14/2022)  Utilities: Not At Risk (03/14/2022)  Tobacco Use: High Risk (03/13/2022)     Readmission Risk Interventions     No data to display

## 2022-03-16 NOTE — Progress Notes (Signed)
Physical Therapy Treatment Patient Details Name: Lindsay Camacho MRN: [id number] DOB: [birth year] Today's Date: 03/16/2022   History of Present Illness 57 yo female presents with chest pain and slurred speech. MRI demonstrates L corona radiata CVA. PMH CAD, cardiomyopathy s/p cardiac defibrillation and pacemake, smoker, TIA 2018    PT Comments    Pt feeling better today, no dizziness throughout treatment. Pt ambulated in hallway at mod I level, able to change directions and pace safely without LOB. Ascended and descended 5 stairs without use of rail. Did need 2 standing rest breaks with hand on rail. Rec outpt PT for high level balance training. PT will continue to follow.    Recommendations for follow up therapy are one component of a multi-disciplinary discharge planning process, led by the attending physician.  Recommendations may be updated based on patient status, additional functional criteria and insurance authorization.  Follow Up Recommendations  Outpatient PT     Assistance Recommended at Discharge PRN  Patient can return home with the following A little help with bathing/dressing/bathroom;Assistance with cooking/housework;Help with stairs or ramp for entrance   Equipment Recommendations  None recommended by PT    Recommendations for Other Services       Precautions / Restrictions Precautions Precautions: Fall Restrictions Weight Bearing Restrictions: No     Mobility  Bed Mobility Overal bed mobility: Independent             General bed mobility comments: no assistance needed    Transfers Overall transfer level: Independent                 General transfer comment: pt able to stand without use of hands, no LOB    Ambulation/Gait Ambulation/Gait assistance: Modified independent (Device/Increase time) Gait Distance (Feet): 400 Feet Assistive device: None Gait Pattern/deviations: WFL(Within Functional Limits) Gait velocity: WFL Gait velocity  interpretation: >2.62 ft/sec, indicative of community ambulatory Pre-gait activities: stepping and reaching General Gait Details: pt with normal movement patterns with ambulation, able to change directions quickly, able to increase and decrease pace, no LOB   Stairs Stairs: Yes Stairs assistance: Modified independent (Device/Increase time) Stair Management: No rails, Alternating pattern, Forwards Number of Stairs: 5 General stair comments: no difficulty on stairs even without use of rail   Wheelchair Mobility    Modified Rankin (Stroke Patients Only)       Balance Overall balance assessment: Modified Independent Sitting-balance support: No upper extremity supported, Feet supported Sitting balance-Leahy Scale: Normal     Standing balance support: No upper extremity supported, During functional activity Standing balance-Leahy Scale: Good Standing balance comment: no dizziness today, pt able to reach out of BOS without LOB               High Level Balance Comments: pt requries external support and grabbing at counter surfaces.            Cognition Arousal/Alertness: Awake/alert Behavior During Therapy: WFL for tasks assessed/performed Overall Cognitive Status: Within Functional Limits for tasks assessed                                 General Comments: no dizziness today        Exercises      General Comments General comments (skin integrity, edema, etc.): BP 150/80 right before pt received BP meds. HR 66 bpm, SPO2 100% on RA      Pertinent Vitals/Pain Pain Assessment Pain Assessment: No/denies pain  Home Living                          Prior Function            PT Goals (current goals can now be found in the care plan section) Acute Rehab PT Goals Patient Stated Goal: to go home PT Goal Formulation: With patient Time For Goal Achievement: 03/28/22 Potential to Achieve Goals: Good Progress towards PT goals: Progressing  toward goals    Frequency    Min 3X/week      PT Plan Current plan remains appropriate    Co-evaluation              AM-PAC PT "6 Clicks" Mobility   Outcome Measure  Help needed turning from your back to your side while in a flat bed without using bedrails?: None Help needed moving from lying on your back to sitting on the side of a flat bed without using bedrails?: None Help needed moving to and from a bed to a chair (including a wheelchair)?: None Help needed standing up from a chair using your arms (e.g., wheelchair or bedside chair)?: None Help needed to walk in hospital room?: None Help needed climbing 3-5 steps with a railing? : None 6 Click Score: 24    End of Session Equipment Utilized During Treatment: Gait belt Activity Tolerance: Patient tolerated treatment well Patient left: in bed;with call bell/phone within reach;with bed alarm set;with family/visitor present Nurse Communication: Mobility status PT Visit Diagnosis: Other abnormalities of gait and mobility (R26.89)     Time: [id number] PT Time Calculation (min) (ACUTE ONLY): 20 min  Charges:  $Gait Training: 8-22 mins                     Leighton Roach, [place] chat preferred Office [place] 03/16/2022, 9:57 AM

## 2022-03-16 NOTE — Progress Notes (Signed)
  Echocardiogram 2D Echocardiogram has been performed.  Lindsay Camacho 03/16/2022, 4:39 PM

## 2022-03-16 NOTE — Progress Notes (Signed)
Discharge instructions provided to patient. All medications, follow up appointments, and discharge instructions discussed. IV out. Monitor off CCMD notified. Discharging home 

## 2022-03-16 NOTE — Discharge Summary (Signed)
Physician Discharge Summary  Lindsay Camacho [id number] DOB: [birth year] DOA: 03/13/2022  PCP: Pcp, No  Admit date: 03/13/2022 Discharge date: 03/16/2022  Admitted From: Home Disposition: Home with outpatient therapies  Recommendations for Outpatient Follow-up:  Follow up with PCP in 1-2 weeks Neurology to schedule follow-up  Discharge Condition: Fair CODE STATUS: Stable Diet recommendation: Low-salt diet  Discharge summary: 57 year old with history of coronary artery disease, hypertension, hyperlipidemia, smoker, peripheral vascular disease, prior history of TIA with no residual deficits, pacemaker who presented to the emergency room with palpitations, chest pain and slurred speech going on for several days.  Seen by cardiology in the emergency room and they adjusted her pacemaker settings.  A subsequent MRI found to have 8 mm acute infarct left corona radiata.  Admitted with acute stroke.  Followed by neurology.     Assessment & Plan:   Acute ischemic stroke, likely small vessel cardioembolic source.  Likely atherosclerotic disease due to smoking. Clinical findings, slurred speech, palpitations and dizziness.  Completely improved now. CT head findings, normal MRI of the brain, 8 mm left coronary radiata infarction CT angiogram head and neck, no large vessel occlusion.  Moderate stenosis of 50%.  Recent history of right carotid stenting.  Continues to smoke. 2D echocardiogram, pending.  With normal ejection fraction. Antiplatelet therapy, on aspirin and Plavix at home, continued. LDL 112.  On atorvastatin 80. Hemoglobin A1c, 5.2.  No intervention needed. Therapy recommendations, outpatient PT OT. Outpatient neurology follow-up.   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  Stable now.  back on Coreg.   Coronary artery disease: Currently stable.  On aspirin Plavix and Lipitor.   GERD: On PPI.   Smoking with severe atherosclerotic disease, recent  carotid stenting: Counseled to quit.  Nicotine patch.  Stable for discharge.    Discharge Diagnoses:  Principal Problem:   Slurred speech Active Problems:   HTN (hypertension)   Tobacco abuse   AVNRT (AV nodal re-entry tachycardia)   Acute stroke due to ischemia [facility])    Discharge Instructions  Discharge Instructions     Ambulatory referral to Neurology   Complete by: As directed    An appointment is requested in approximately: 4 weeks   Ambulatory referral to Occupational Therapy   Complete by: As directed    Ambulatory referral to Physical Therapy   Complete by: As directed    Ambulatory referral to Speech Therapy   Complete by: As directed    Diet - low sodium heart healthy   Complete by: As directed    Increase activity slowly   Complete by: As directed       Allergies as of 03/16/2022       Reactions   Aldomet [methyldopa] Other (See Comments)   Severe hypotension   Brilinta [ticagrelor] Shortness Of Breath   Other Other (See Comments)   Nuclear Stress Test Medication caused seizures   Coumadin [warfarin Sodium] Swelling   Arm swelling   Desyrel [trazodone] Other (See Comments)   Could not wake up from medication, comatosed   Eliquis [apixaban] Other (See Comments)   BP got too low   Entresto [sacubitril-valsartan] Hypertension   Hypotension    Zestril [lisinopril]    Significant blood pressure fluctuations    Penicillins Rash   Has patient had a PCN reaction causing immediate rash, facial/tongue/throat swelling, SOB or lightheadedness with hypotension: Yes Has patient had a PCN reaction causing severe rash involving mucus membranes or skin necrosis: No Has patient had a PCN  reaction that required hospitalization: No Has patient had a PCN reaction occurring within the last 10 years: No If all of the above answers are "NO", then may proceed with Cephalosporin use.        Medication List     STOP taking these medications    lidocaine 4 %    Nexletol 180 MG Tabs Generic drug: Bempedoic Acid   potassium chloride 10 MEQ tablet Commonly known as: KLOR-CON       TAKE these medications    aspirin EC 81 MG tablet Take 81 mg by mouth daily.   atorvastatin 80 MG tablet Commonly known as: LIPITOR Take 1 tablet (80 mg total) by mouth daily.   carvedilol 6.25 MG tablet Commonly known as: COREG Take 1 tablet (6.25 mg total) by mouth 2 (two) times daily.   clopidogrel 75 MG tablet Commonly known as: PLAVIX Take 1 tablet (75 mg total) by mouth daily.   ergocalciferol 1.25 MG (50000 UT) capsule Commonly known as: VITAMIN D2 Take 1 capsule (50,000 Units total) by mouth every Friday. What changed: when to take this   furosemide 40 MG tablet Commonly known as: LASIX Take 1 tablet (40 mg total) by mouth daily as needed for fluid or edema (shortness of breath).   losartan 25 MG tablet Commonly known as: COZAAR Take 1 tablet (25 mg total) by mouth daily.   nicotine 14 mg/24hr patch Commonly known as: NICODERM CQ - dosed in mg/24 hours Apply daily for 6 weeks, then decrease to '7mg'$  daily for 2 weeks What changed: Another medication with the same name was removed. Continue taking this medication, and follow the directions you see here.   oxyCODONE-acetaminophen 5-325 MG tablet Commonly known as: PERCOCET/ROXICET Take 1 tablet by mouth every 6 (six) hours as needed for up to 10 doses for severe pain.   pantoprazole 40 MG tablet Commonly known as: PROTONIX Take 1 tablet (40 mg total) by mouth daily.   SALONPAS PAIN RELIEF PATCH EX Apply 1 patch topically daily as needed (pain).   spironolactone 25 MG tablet Commonly known as: ALDACTONE Take 0.5 tablets (12.5 mg total) by mouth daily.        Follow-up Information     Mayra Reel, NP Follow up on 03/31/2022.   Specialty: Cardiology Why: 2:30PM. Cardiology follow up Contact information: [address] [place] [address] [phone]          [facility] Follow up.   Specialty: Rehabilitation Why: the outpatient rehab will contact you for the first appointment Contact information: [place] [id number] [place] [id number] [phone]               Allergies  Allergen Reactions   Aldomet [Methyldopa] Other (See Comments)    Severe hypotension   Brilinta [Ticagrelor] Shortness Of Breath   Other Other (See Comments)    Nuclear Stress Test Medication caused seizures   Coumadin [Warfarin Sodium] Swelling    Arm swelling   Desyrel [Trazodone] Other (See Comments)    Could not wake up from medication, comatosed   Eliquis [Apixaban] Other (See Comments)    BP got too low   Entresto [Sacubitril-Valsartan] Hypertension    Hypotension    Zestril [Lisinopril]     Significant blood pressure fluctuations    Penicillins Rash    Has patient had a PCN reaction causing immediate rash, facial/tongue/throat swelling, SOB or lightheadedness with hypotension: Yes Has patient had a PCN  reaction causing severe rash involving mucus membranes or skin necrosis: No Has patient had a PCN reaction that required hospitalization: No Has patient had a PCN reaction occurring within the last 10 years: No If all of the above answers are "NO", then may proceed with Cephalosporin use.     Consultations: Neurology.   Procedures/Studies: ECHOCARDIOGRAM COMPLETE  Result Date: 03/16/2022    ECHOCARDIOGRAM REPORT   Patient Name:   RAYMOND SCHWADERER Date of Exam: 03/16/2022 Medical Rec #:  [id number]     Height:       61.0 in Accession #:    [id number]    Weight:       103.0 lb Date of Birth:  09-18-1965     BSA:          1.425 m Patient Age:    10 years      BP:           132/82 mmHg Patient Gender: F             HR:           64 bpm. Exam Location:  Inpatient Procedure: 2D Echo, Cardiac Doppler and Color Doppler Indications:    stroke  History:        Patient has prior  history of Echocardiogram examinations, most                 recent 09/01/2021. Cardiomyopathy and CHF, CAD, Defibrillator;                 Arrythmias:LBBB.                  Mitral Valve: prosthetic annuloplasty ring valve is present in                 the mitral position. Procedure Date: 08/25/16.  Sonographer:    [place] Referring Phys: [id number] [place]  1. Left ventricular ejection fraction, by estimation, is 55 to 60%. The left ventricle has normal function. The left ventricle has no regional wall motion abnormalities. There is mild concentric left ventricular hypertrophy. Left ventricular diastolic function could not be evaluated.  2. Right ventricular systolic function is normal. The right ventricular size is normal. There is mildly elevated pulmonary artery systolic pressure. The estimated right ventricular systolic pressure is [id number] mmHg.  3. Left atrial size was mildly dilated.  4. The mitral valve is myxomatous. Trivial mitral valve regurgitation. No evidence of mitral stenosis. The mean mitral valve gradient is 3.0 mmHg with average heart rate of 62 bpm. There is a prosthetic annuloplasty ring present in the mitral position. Procedure Date: 08/25/16. Echo findings are consistent with normal structure and function of the mitral valve prosthesis.  5. The tricuspid valve is abnormal. Tricuspid valve regurgitation is severe.  6. The aortic valve is tricuspid. Aortic valve regurgitation is mild to moderate. No aortic stenosis is present. FINDINGS  Left Ventricle: Left ventricular ejection fraction, by estimation, is 55 to 60%. The left ventricle has normal function. The left ventricle has no regional wall motion abnormalities. The left ventricular internal cavity size was normal in size. There is  mild concentric left ventricular hypertrophy. Abnormal (paradoxical) septal motion consistent with post-operative status. Left ventricular diastolic function could not be evaluated due to  mitral valve repair. Left ventricular diastolic function could not be evaluated. Right Ventricle: The right ventricular size is normal. No increase in right ventricular wall thickness. Right ventricular systolic function is normal. There is mildly  elevated pulmonary artery systolic pressure. The tricuspid regurgitant velocity is 2.98  m/s, and with an assumed right atrial pressure of 3 mmHg, the estimated right ventricular systolic pressure is [id number] mmHg. Left Atrium: Left atrial size was mildly dilated. Right Atrium: Right atrial size was normal in size. Pericardium: There is no evidence of pericardial effusion. Mitral Valve: The mitral valve is myxomatous. Trivial mitral valve regurgitation. There is a prosthetic annuloplasty ring present in the mitral position. Procedure Date: 08/25/16. Echo findings are consistent with normal structure and function of the mitral valve prosthesis. No evidence of mitral valve stenosis. MV peak gradient, 7.3 mmHg. The mean mitral valve gradient is 3.0 mmHg with average heart rate of 62 bpm. Tricuspid Valve: The tricuspid valve is abnormal. Tricuspid valve regurgitation is severe. Aortic Valve: The aortic valve is tricuspid. Aortic valve regurgitation is mild to moderate. Aortic regurgitation PHT measures 476 msec. No aortic stenosis is present. Pulmonic Valve: The pulmonic valve was grossly normal. Pulmonic valve regurgitation is not visualized. Aorta: The aortic root and ascending aorta are structurally normal, with no evidence of dilitation. Venous: The inferior vena cava and the hepatic vein show a pattern of systolic flow reversal, suggestive of tricuspid regurgitation. IAS/Shunts: No atrial level shunt detected by color flow Doppler. Additional Comments: A device lead is visualized in the right ventricle.  LEFT VENTRICLE PLAX 2D LVIDd:         3.60 cm LVIDs:         3.10 cm LV PW:         1.30 cm LV IVS:        1.20 cm  LV Volumes (MOD) LV vol d, MOD A4C: 75.1 ml LV vol s, MOD  A4C: 42.2 ml LV SV MOD A4C:     75.1 ml RIGHT VENTRICLE             IVC RV Basal diam:  2.70 cm     IVC diam: 2.00 cm RV S prime:     11.10 cm/s TAPSE (M-mode): 1.9 cm LEFT ATRIUM             Index        RIGHT ATRIUM           Index LA diam:        3.80 cm 2.67 cm/m   RA Area:     14.60 cm LA Vol (A2C):   51.6 ml 36.22 ml/m  RA Volume:   37.30 ml  26.18 ml/m LA Vol (A4C):   44.1 ml 30.96 ml/m LA Biplane Vol: 48.4 ml 33.97 ml/m  AORTIC VALVE LVOT Vmax:   88.00 cm/s LVOT Vmean:  56.300 cm/s LVOT VTI:    0.180 m AI PHT:      476 msec  AORTA Ao Asc diam: 2.80 cm MITRAL VALVE                TRICUSPID VALVE MV Area (PHT): 2.76 cm     TR Peak grad:   35.5 mmHg MV Peak grad:  7.3 mmHg     TR Vmax:        298.00 cm/s MV Mean grad:  3.0 mmHg MV Vmax:       1.35 m/s     SHUNTS MV Vmean:      87.0 cm/s    Systemic VTI: 0.18 m MV Decel Time: 275 msec MV E velocity: 108.00 cm/s Dani Gobble Croitoru MD Electronically signed by Sanda Klein MD Signature Date/Time: 03/16/2022/4:56:50 PM    Final  CT ANGIO HEAD NECK W WO CM  Result Date: 03/15/2022 CLINICAL DATA:  Complains of slurred speech EXAM: CT ANGIOGRAPHY HEAD AND NECK TECHNIQUE: Multidetector CT imaging of the head and neck was performed using the standard protocol during bolus administration of intravenous contrast. Multiplanar CT image reconstructions and MIPs were obtained to evaluate the vascular anatomy. Carotid stenosis measurements (when applicable) are obtained utilizing NASCET criteria, using the distal internal carotid diameter as the denominator. RADIATION DOSE REDUCTION: This exam was performed according to the departmental dose-optimization program which includes automated exposure control, adjustment of the mA and/or kV according to patient size and/or use of iterative reconstruction technique. CONTRAST:  33m OMNIPAQUE IOHEXOL 350 MG/ML SOLN COMPARISON:  Brain MRI from yesterday FINDINGS: CT HEAD FINDINGS Brain: Small white matter infarct in the deep  left cerebral white matter is subtle by CT. No visible progression. No hemorrhage, hydrocephalus, or collection. Vascular: No hyperdense vessel or unexpected calcification. Skull: Normal. Negative for fracture or focal lesion. Sinuses/Orbits: No acute finding. Review of the MIP images confirms the above findings CTA NECK FINDINGS Aortic arch: Atheromatous plaque with 3 vessel branching Right carotid system: Extensive calcified plaque at the brachiocephalic artery with some irregularity and 50% narrowing on sagittal reformats. Milder plaque at the carotid bifurcation without stenosis or ulceration Left carotid system: Mixed density plaque causing mild stenosis at the origin of the common carotid. Mixed density plaque at the carotid bifurcation not causing flow reducing stenosis. Vertebral arteries: Proximal left subclavian artery stent which is widely patent. Calcified plaque at both vertebral ostia causing 50% narrowing at the right on sagittal reformats. No dissection or beading seen, vertebral patency to the level of the dura. 60% narrowing at the right subclavian artery beyond the vertebral origin. Skeleton: No acute finding Other neck: No acute finding Upper chest: Clear apical lungs Review of the MIP images confirms the above findings CTA HEAD FINDINGS Anterior circulation: Mild plaque on the carotid siphons. No branch occlusion, beading, or aneurysm. Posterior circulation: Vessels are smoothly contoured and widely patent. Negative for aneurysm or vascular malformation Venous sinuses: Negative Anatomic variants: Negative Review of the MIP images confirms the above findings IMPRESSION: No emergent arterial finding. 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 *[id number] artery stenosis at the origin. *50% narrowing at the right vertebral origin. Electronically Signed   By: JJorje GuildM.D.   On: 03/15/2022 11:42    MR BRAIN WO CONTRAST  Result Date: 03/14/2022 CLINICAL DATA:  Transient ischemic attack. EXAM: MRI HEAD WITHOUT CONTRAST TECHNIQUE: Multiplanar, multiecho pulse sequences of the brain and surrounding structures were obtained without intravenous contrast. COMPARISON:  Head CT yesterday. FINDINGS: Brain: 8 mm acute infarction in the left corona radiata. No other acute infarction. The brainstem and cerebellum are normal. Cerebral hemispheres otherwise are normal without evidence of other small or large vessel insult. No mass, hemorrhage, hydrocephalus or extra-axial collection. Vascular: Major vessels at the base of the brain show flow. Skull and upper cervical spine: Negative Sinuses/Orbits: Clear/normal Other: None IMPRESSION: 8 mm acute infarction in the left corona radiata. Otherwise normal study. Electronically Signed   By: MNelson ChimesM.D.   On: 03/14/2022 14:38   CT Head Wo Contrast  Result Date: 03/13/2022 CLINICAL DATA:  57year old female with acute neuro deficit EXAM: CT HEAD WITHOUT CONTRAST TECHNIQUE: Contiguous axial images were obtained from the base of the skull through the vertex without intravenous contrast. RADIATION DOSE REDUCTION: This exam was performed  according to the departmental dose-optimization program which includes automated exposure control, adjustment of the mA and/or kV according to patient size and/or use of iterative reconstruction technique. COMPARISON:  08/14/2021 CT and prior studies FINDINGS: Brain: A new 8 mm hypodensity within the white matter adjacent to the LEFT basal ganglia is compatible with age indeterminate infarct. There is no evidence of hemorrhage. There is no evidence of mass lesion or mass effect, hydrocephalus, extra-axial collection or other infarct. Mild atrophy noted. Vascular: Carotid atherosclerotic calcifications are noted. Skull: Normal. Negative for fracture or focal lesion. Sinuses/Orbits: No acute finding. Other: None. IMPRESSION: 1.  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 Consider MRI for further evaluation as clinically indicated. 2. Mild atrophy. Electronically Signed   By: Margarette Canada M.D.   On: 03/13/2022 18:56   DG Chest Port 1 View  Result Date: 03/13/2022 CLINICAL DATA:  Tachycardia, chest pain, short of breath EXAM: PORTABLE CHEST 1 VIEW COMPARISON:  03/05/2022 FINDINGS: Single frontal view of the chest demonstrates multi lead pacer/AICD unchanged. Postsurgical changes of the mitral valve. Stable mild cardiomegaly. No acute airspace disease, effusion, or pneumothorax. Stable stent in the left subclavian distribution. No acute bony abnormality. IMPRESSION: 1. Stable chest, no acute process. Electronically Signed   By: Randa Ngo M.D.   On: 03/13/2022 18:38   CT ANGIO UP EXTREM LEFT W &/OR WO CONTAST  Result Date: 03/05/2022 CLINICAL DATA:  Severe chest pain radiating to left arm. History of left proximal subclavian artery stenting last year. EXAM: CT ANGIOGRAPHY OF THE LEFT UPPEREXTREMITY TECHNIQUE: Multidetector CT imaging of the left upperwas performed using the standard protocol during bolus administration of intravenous contrast. Multiplanar CT image reconstructions and MIPs were obtained to evaluate the vascular anatomy. RADIATION DOSE REDUCTION: This exam was performed according to the departmental dose-optimization program which includes automated exposure control, adjustment of the mA and/or kV according to patient size and/or use of iterative reconstruction technique. CONTRAST:  145m OMNIPAQUE IOHEXOL 350 MG/ML SOLN COMPARISON:  None Available. FINDINGS: Vascular: Prior stenting of the left proximal subclavian artery. The stent is widely patent. The left subclavian, axillary, brachial, radial, and ulnar arteries are unremarkable. Bones/Joint/Cartilage No fracture or dislocation. Joint spaces are preserved. No joint effusion. Ligaments Ligaments are suboptimally evaluated by CT. Muscles and  Tendons Grossly intact.  No muscle atrophy. Soft tissue No fluid collection or hematoma.  No soft tissue mass. Review of the MIP images confirms the above findings. IMPRESSION: 1. Prior stenting of the left proximal subclavian artery. The stent is widely patent. 2. Otherwise unremarkable CTA of the left upper extremity. Electronically Signed   By: WTitus DubinM.D.   On: 03/05/2022 15:02   CT Angio Chest Aorta W and/or Wo Contrast  Result Date: 03/05/2022 CLINICAL DATA:  Left-sided chest pain radiating to the left arm. History left subclavian artery stenosis status post stenting last year. EXAM: CT ANGIOGRAPHY CHEST WITH CONTRAST TECHNIQUE: Multidetector CT imaging of the chest was performed using the standard protocol during bolus administration of intravenous contrast. Multiplanar CT image reconstructions and MIPs were obtained to evaluate the vascular anatomy. RADIATION DOSE REDUCTION: This exam was performed according to the departmental dose-optimization program which includes automated exposure control, adjustment of the mA and/or kV according to patient size and/or use of iterative reconstruction technique. CONTRAST:  1090mOMNIPAQUE IOHEXOL 350 MG/ML SOLN COMPARISON:  CT chest dated August 28, 2021. FINDINGS: Cardiovascular: Preferential opacification of the thoracic aorta. No evidence of thoracic aortic aneurysm or dissection. Coronary, aortic arch,  and branch vessel atherosclerotic vascular disease. Interval stenting of the proximal left subclavian artery with resolved stenosis. Chronic cardiomegaly with prominent right heart enlargement. No pericardial effusion. No pulmonary embolism. Reflux of contrast into the IVC and hepatic veins. Left chest wall AICD. Mediastinum/Nodes: No enlarged mediastinal, hilar, or axillary lymph nodes. Thyroid gland, trachea, and esophagus demonstrate no significant findings. Lungs/Pleura: Central peribronchial thickening. Unchanged scarring in the right upper lobe. No  focal consolidation, pleural effusion, or pneumothorax. Upper Abdomen: No acute abnormality. Musculoskeletal: No chest wall abnormality. No acute or significant osseous findings. Review of the MIP images confirms the above findings. IMPRESSION: 1. No evidence of thoracic aortic aneurysm or dissection. 2. Interval stenting of the proximal left subclavian artery with resolved stenosis. 3. Chronic cardiomegaly with evidence of right heart dysfunction. 4.  Aortic atherosclerosis (ICD10-I70.0). Electronically Signed   By: Titus Dubin M.D.   On: 03/05/2022 14:56   DG Chest 2 View  Result Date: 03/05/2022 CLINICAL DATA:  chest pain EXAM: CHEST - 2 VIEW COMPARISON:  August 28, 2021 FINDINGS: The cardiomediastinal silhouette is unchanged in contour.LEFT chest AICD. Status post mitral valve replacement. LEFT subclavian stent. No pleural effusion. No pneumothorax. No acute pleuroparenchymal abnormality. Visualized abdomen is unremarkable. Vascular calcifications. Surgical clips project over the RIGHT axilla. IMPRESSION: No acute cardiopulmonary abnormality. Electronically Signed   By: Valentino Saxon M.D.   On: 03/05/2022 12:12   (Echo, Carotid, EGD, Colonoscopy, ERCP)    Subjective: Patient seen and examined.  Walking in the hallway.  Eager to go home.  Denies any complaints.  No more slurred speech.   Discharge Exam: Vitals:   03/16/22 1106 03/16/22 1628  BP: 132/82 (!) 142/98  Pulse: 66 66  Resp: 18 18  Temp: 98 F (36.7 C) 97.9 F (36.6 C)  SpO2: 100% 100%   Vitals:   03/16/22 0745 03/16/22 0932 03/16/22 1106 03/16/22 1628  BP: (!) 175/75 (!) 159/80 132/82 (!) 142/98  Pulse: 68  66 66  Resp: '18  18 18  '$ Temp: 97.9 F (36.6 C)  98 F (36.7 C) 97.9 F (36.6 C)  TempSrc: Oral  Oral Oral  SpO2: 98%  100% 100%  Weight:      Height:        General: Pt is alert, awake, not in acute distress Walking in the hallway. Cardiovascular: RRR, S1/S2 +, no rubs, no gallops, pacemaker in  place. Respiratory: CTA bilaterally, no wheezing, no rhonchi Abdominal: Soft, NT, ND, bowel sounds + Extremities: no edema, no cyanosis    The results of significant diagnostics from this hospitalization (including imaging, microbiology, ancillary and laboratory) are listed below for reference.     Microbiology: No results found for this or any previous visit (from the past 240 hour(s)).   Labs: BNP (last 3 results) Recent Labs    08/14/21 2126  BNP [id number]*   Basic Metabolic Panel: Recent Labs  Lab 03/13/22 1728 03/14/22 0251  NA 138 138  K 4.4 3.9  CL 105 108  CO2 20* 24  GLUCOSE 95 92  BUN 15 13  CREATININE 0.97 0.89  CALCIUM 9.7 8.9  MG 1.8 1.7  PHOS  --  3.6   Liver Function Tests: No results for input(s): "AST", "ALT", "ALKPHOS", "BILITOT", "PROT", "ALBUMIN" in the last 168 hours. No results for input(s): "LIPASE", "AMYLASE" in the last 168 hours. No results for input(s): "AMMONIA" in the last 168 hours. CBC: Recent Labs  Lab 03/13/22 1728 03/14/22 0251  WBC 9.1 6.0  NEUTROABS  5.4  --   HGB 17.4* 15.4*  HCT 51.8* 46.9*  MCV 87.9 89.2  PLT 213 169   Cardiac Enzymes: No results for input(s): "CKTOTAL", "CKMB", "CKMBINDEX", "TROPONINI" in the last 168 hours. BNP: Invalid input(s): "POCBNP" CBG: Recent Labs  Lab 03/15/22 1925  GLUCAP 90   D-Dimer No results for input(s): "DDIMER" in the last 72 hours. Hgb A1c Recent Labs    03/14/22 0528  HGBA1C 5.2   Lipid Profile Recent Labs    03/14/22 0528  CHOL 163  HDL 34*  LDLCALC 112*  TRIG 87  CHOLHDL 4.8   Thyroid function studies No results for input(s): "TSH", "T4TOTAL", "T3FREE", "THYROIDAB" in the last 72 hours.  Invalid input(s): "FREET3" Anemia work up No results for input(s): "VITAMINB12", "FOLATE", "FERRITIN", "TIBC", "IRON", "RETICCTPCT" in the last 72 hours. Urinalysis    Component Value Date/Time   COLORURINE YELLOW 08/23/2016 1631   APPEARANCEUR CLEAR 08/23/2016 1631    LABSPEC 1.007 08/23/2016 1631   PHURINE 7.0 08/23/2016 1631   GLUCOSEU >=500 (A) 08/23/2016 1631   HGBUR NEGATIVE 08/23/2016 1631   BILIRUBINUR NEGATIVE 08/23/2016 1631   KETONESUR NEGATIVE 08/23/2016 1631   PROTEINUR NEGATIVE 08/23/2016 1631   UROBILINOGEN 1.0 09/24/2014 1140   NITRITE NEGATIVE 08/23/2016 [place] 08/23/2016 1631   Sepsis Labs Recent Labs  Lab 03/13/22 1728 03/14/22 0251  WBC 9.1 6.0   Microbiology No results found for this or any previous visit (from the past 240 hour(s)).   Time coordinating discharge: 35 minutes  SIGNED:   Barb Merino, MD  [facility] 03/16/2022, 5:57 PM

## 2022-03-16 NOTE — Progress Notes (Signed)
Stroke education given to pt, specifically, BEFAST, 911 activation. Individual risk factors or stroke identified: smoking, cardiovascular disease, HTN, HLD.

## 2022-03-16 NOTE — Progress Notes (Addendum)
STROKE TEAM PROGRESS NOTE   INTERVAL HISTORY From a stroke standpoint no need for dapt, however may still be indicated from a cardiac/vascular stand point. Felt her heart racing and then noted slurred speech starting Sunday. This slurred speech fluctuated and she came to the ED Monday. Neurologically stable, states she feels back to normal. Discussed trying Leqvio and she is interested as she has failed other injections and statins in the past. MRI scan shows small lacunar infarct left corona radiator.  CT angiogram showed 60% proximal complete stenosis.Marland Kitchen  LDL Cholesterol is identical milligrams percent.  Hemoglobin A1c is 5.2. Vitals:   03/15/22 1951 03/15/22 2321 03/16/22 0330 03/16/22 0745  BP: (!) 174/61 (!) 158/76 (!) 163/76 (!) 175/75  Pulse: 64 61 65 68  Resp:  '16 16 18  '$ Temp: 97.8 F (36.6 C) 97.9 F (36.6 C) 98.2 F (36.8 C) 97.9 F (36.6 C)  TempSrc: Oral Oral Oral Oral  SpO2: 100% 98% 98% 98%  Weight:      Height:       CBC:  Recent Labs  Lab 03/13/22 1728 03/14/22 0251  WBC 9.1 6.0  NEUTROABS 5.4  --   HGB 17.4* 15.4*  HCT 51.8* 46.9*  MCV 87.9 89.2  PLT 213 [id number]   Basic Metabolic Panel:  Recent Labs  Lab 03/13/22 1728 03/14/22 0251  NA 138 138  K 4.4 3.9  CL 105 108  CO2 20* 24  GLUCOSE 95 92  BUN 15 13  CREATININE 0.97 0.89  CALCIUM 9.7 8.9  MG 1.8 1.7  PHOS  --  3.6   Lipid Panel:  Recent Labs  Lab 03/14/22 0528  CHOL 163  TRIG 87  HDL 34*  CHOLHDL 4.8  VLDL 17  LDLCALC 112*   HgbA1c:  Recent Labs  Lab 03/14/22 0528  HGBA1C 5.2   Urine Drug Screen: No results for input(s): "LABOPIA", "COCAINSCRNUR", "LABBENZ", "AMPHETMU", "THCU", "LABBARB" in the last 168 hours.  Alcohol Level No results for input(s): "ETH" in the last 168 hours.  IMAGING past 24 hours CT ANGIO HEAD NECK W WO CM  Result Date: 03/15/2022 CLINICAL DATA:  Complains of slurred speech EXAM: CT ANGIOGRAPHY HEAD AND NECK TECHNIQUE: Multidetector CT imaging of the head and  neck was performed using the standard protocol during bolus administration of intravenous contrast. Multiplanar CT image reconstructions and MIPs were obtained to evaluate the vascular anatomy. Carotid stenosis measurements (when applicable) are obtained utilizing NASCET criteria, using the distal internal carotid diameter as the denominator. RADIATION DOSE REDUCTION: This exam was performed according to the departmental dose-optimization program which includes automated exposure control, adjustment of the mA and/or kV according to patient size and/or use of iterative reconstruction technique. CONTRAST:  25m OMNIPAQUE IOHEXOL 350 MG/ML SOLN COMPARISON:  Brain MRI from yesterday FINDINGS: CT HEAD FINDINGS Brain: Small white matter infarct in the deep left cerebral white matter is subtle by CT. No visible progression. No hemorrhage, hydrocephalus, or collection. Vascular: No hyperdense vessel or unexpected calcification. Skull: Normal. Negative for fracture or focal lesion. Sinuses/Orbits: No acute finding. Review of the MIP images confirms the above findings CTA NECK FINDINGS Aortic arch: Atheromatous plaque with 3 vessel branching Right carotid system: Extensive calcified plaque at the brachiocephalic artery with some irregularity and 50% narrowing on sagittal reformats. Milder plaque at the carotid bifurcation without stenosis or ulceration Left carotid system: Mixed density plaque causing mild stenosis at the origin of the common carotid. Mixed density plaque at the carotid bifurcation not causing flow  reducing stenosis. Vertebral arteries: Proximal left subclavian artery stent which is widely patent. Calcified plaque at both vertebral ostia causing 50% narrowing at the right on sagittal reformats. No dissection or beading seen, vertebral patency to the level of the dura. 60% narrowing at the right subclavian artery beyond the vertebral origin. Skeleton: No acute finding Other neck: No acute finding Upper chest:  Clear apical lungs Review of the MIP images confirms the above findings CTA HEAD FINDINGS Anterior circulation: Mild plaque on the carotid siphons. No branch occlusion, beading, or aneurysm. Posterior circulation: Vessels are smoothly contoured and widely patent. Negative for aneurysm or vascular malformation Venous sinuses: Negative Anatomic variants: Negative Review of the MIP images confirms the above findings IMPRESSION: No emergent arterial finding. 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 *[id number] brachiocephalic artery stenosis at the origin. *50% narrowing at the right vertebral origin. Electronically Signed   By: Jorje Guild M.D.   On: 03/15/2022 11:42    PHYSICAL EXAM  Physical Exam  Constitutional: Appears well-developed and well-nourished.   Cardiovascular: Normal rate and regular rhythm.  Respiratory: Effort normal, non-labored breathing  NEURO:  Mental Status: AA&Ox3  Language: speech is mildly dysarthric.  Naming, repetition, fluency, and comprehension intact. Cranial Nerves: PERRL EOMI, visual fields full, no facial asymmetry,facial sensation intact, hearing intact, tongue/uvula/soft palate midline, normal sternocleidomastoid and trapezius muscle strength. No evidence of tongue atrophy or fibrillations Motor: 5/5 in all fours without drift Tone: is normal and bulk is normal Sensation- Intact to light touch bilaterally Coordination: FTN intact bilaterally, no ataxia in BLE. Gait- deferred   ASSESSMENT/PLAN Ms. JAZARIAH ROSCH is a 57 y.o. female with history of CAD, hypertension, hyperlipidemia, tobacco abuse, peripheral vascular disease, prior TIA with no residual deficits, heart failure with preserved ejection fraction, status post pacemaker placement, presented to the emergency room for evaluation of palpitations, chest pain and slurred speech that have been going on for several days.    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  CTA head & neck - 60% right subclavian artery stenosis beyond the vertebral origin. [id number] brachiocephalic artery stenosis at the origin. 50% narrowing at the right vertebral origin. MRI- 8 mm acute infarction in the left corona radiata  2D Echo Pending  LDL 112 HgbA1c 5.2 VTE prophylaxis - Lovenox    Diet   Diet Heart Room service appropriate? Yes; Fluid consistency: Thin   aspirin 81 mg daily and clopidogrel 75 mg daily prior to admission, now on  Therapy recommendations:  Outpatient PT/OT Disposition:  home  Hypertension Home meds:  Cozaar, coreg Stable Permissive hypertension (OK if < 220/120) but gradually normalize in 5-7 days Long-term BP goal normotensive  Hyperlipidemia Home meds:  Atorvastatin '80mg'$ , resumed in hospital LDL 112, goal < 70 Consider leqvio- forms filled out  Continue statin at discharge until she is transitioned to leqvio   Other Stroke Risk Factors Cigarette smoker, advised to stop smoking Nicotine patch ordered ETOH use, alcohol level No results found for requested labs within last 1095 days., advised to drink no more than 1 drink(s) a day Hx TIA Coronary artery disease PPM Congestive heart failure Spironolactone Lasix '40mg'$  PRN  Other Active Problems GERD- continue PPT  Hospital day # 0  Patient seen and examined by NP/APP with MD. MD to update note as needed.   Janine Ores, DNP, FNP-BC  [facility] Pager: [phone] STROKE MD NOTE :  I have personally obtained history,examined this patient, reviewed notes, independently viewed imaging studies, participated in medical decision making and plan of care.ROS completed by me personally and pertinent positives fully documented  I have made any additions or clarifications directly to the above note. Agree with  note above.  She presented with a small lacunar infarct secondary to small vessel disease.  She has elevated LDL despite maximum dose statin and may benefit with consideration for [place] adding Leqvio after insurance approval and preauthorization.  Recommend aspirin Plavix for 3 weeks followed by aspirin alone and aggressive risk factor modification.  Patient counseled to quit smoking cigarettes and will need nicotine patch to help her quit.  Greater than 50% time during this 50-minute visit was met in counseling and coordination of care about stroke and TIA and discussion about evaluation and prevention and treatment and management of hyperlipidemia and answering questions  Antony Contras, MD Medical Director [place] Pager: [phone] 03/16/2022 4:01 PM   To contact Stroke Continuity provider, please refer to [email or web address] After hours, contact General Neurology

## 2022-03-17 ENCOUNTER — Telehealth: Payer: Self-pay | Admitting: Cardiovascular Disease

## 2022-03-17 NOTE — Telephone Encounter (Signed)
Ok

## 2022-03-17 NOTE — Telephone Encounter (Signed)
Pt just got out of the hospital from having a stroke and pt stated that the hospital wanted her to inform Dr. Fletcher Anon that she had an echo done while she was there.

## 2022-03-21 ENCOUNTER — Telehealth: Payer: Self-pay | Admitting: Interventional Cardiology

## 2022-03-21 NOTE — Telephone Encounter (Signed)
Pt informed that I am not sure who called her from this office.  I cannot find any documentation. Aware Dr. Hassell Done nurse may no more and will follow up with her when she returns to the office. Patient verbalized understanding and agreeable to plan.

## 2022-03-21 NOTE — Telephone Encounter (Signed)
Patient states she was returning call. Please advise  ?

## 2022-03-21 NOTE — Telephone Encounter (Signed)
Patient returned RN's call. 

## 2022-03-21 NOTE — Telephone Encounter (Signed)
Left message to call back  

## 2022-03-23 NOTE — Telephone Encounter (Signed)
I spoke with patient and told her I did not see any calls placed from our office. Patient made aware of appointment with Mayra Reel on 4/5 and appointment for carotid dopplers on 4/8.  Patient reports she does not feel right.  This has been ongoing since leaving the hospital.  She feels off balance, sleepy and fatigued.  I asked her to follow up with either neurology or PCP for these issues.

## 2022-03-27 ENCOUNTER — Telehealth: Payer: Self-pay | Admitting: Interventional Cardiology

## 2022-03-27 NOTE — Telephone Encounter (Signed)
Patient stated she was supposed to be prescribed a shot to replace her Nexletol.

## 2022-03-27 NOTE — Telephone Encounter (Signed)
Called to discuss patient's questions about lipid lowering medications. No answer on phone number provided by patient, phone number on 2017 DPR appears to be out of service. Left message with no identifying information on voice mail asking recipient to call us back.

## 2022-03-28 ENCOUNTER — Ambulatory Visit: Payer: Medicare HMO | Admitting: Student

## 2022-03-28 NOTE — Progress Notes (Signed)
CC: Hospital Follow-up  HPI:   Ms.Lindsay Camacho is a 57 y.o. female with a past medical history of hypertension, hyperlipidemia, tobacco use, PVD, CAD, MI post pacemaker, CHF, and CVA who presents for hospital follow-up. She was admitted to [facility] on 3-11 for several days of chest pain plus slurred speech, found to have acute left corona radiata infarct, and discharged 3-14 with home medications.   Past Medical History:  Diagnosis Date   Acute myocardial infarction of other lateral wall, initial episode of care 01/2013   DES CFX   Aortoiliac occlusive disease ([place]) 08/22/2016   CAD (coronary artery disease) 11/13/2011   50% ? 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   Edwards McArthy-Adams IMR ETLogix ring annuloplasty (Model 4100, Serial [id number], size 26) placed via right mini thoracotomy approach   Stenosis of left subclavian artery (HCC) 08/22/2016   Stroke ([place])    tia with no deficits     Review of Systems:    Reports occasional chest discomfort Denies palpitations, dizziness, chest pain, breath shortness, leg swelling, orthopnea   Physical Exam:  Vitals:   03/29/22 1103  BP: 134/61  Pulse: 75  Temp: (!) 97.5 F (36.4 C)  TempSrc: Oral  SpO2: 98%  Weight: 106 lb (48.1 kg)  Height: 5\' 1"  (1.549 m)    General:   awake and alert, sitting comfortably in chair, cooperative, not in acute distress Skin:   warm and dry, intact without any obvious lesions or  scars, no rashes Lungs:   normal respiratory effort, breathing unlabored, symmetrical chest rise, no crackles or wheezing Cardiac:   regular rate and rhythm, normal S1 and S2, capillary refill <2 seconds, dorsalis pedis pulses intact bilaterally, no pitting edema Neurologic:   oriented to person-place-time, moving all extremities, no gross focal deficits Psychiatric:   euthymic mood with congruent affect, intelligible speech    Assessment & Plan:   Acute stroke due to ischemia [facility]) Patient was hospitalized on 3-11 for acute left corona radiata infarction, discharged 3-14 on home medications. Since leaving the hospital, she has noticed an improvement in her ability to talk but still experiencing some delayed speech. Denies any palpitations or dizziness. Reports daily adherence to aspirin, clopidogrel, and atorvastatin. Most recent lipid panel collected 03-2022 demonstrated LDL 112. Previously taking bempedoic acid, but discontinued during hospitalization in favor of inclisiran recommended by neurology. Currently, she is working with her cardiologist to obtain this new medication.  - Continue clopidogrel 75mg  q24, aspirin 81mg  q24, and atorvastatin 80mg  q24 - Start inclisiran pending necessary authorization - Neurology visit scheduled for 5-16    Heart failure [facility]) Patient has history of heart failure post bypass graft and pacemaker. Managed with carvedilol, losartan, and spironolactone daily plus furosemide as needed. Denies breath shortness, leg swelling, and orthopnea. Exam negative for signs of fluid overload.   - Continue carvedilol 6.25mg  q12, losartan 25mg  q24, and spironolactone 12.5mg  q24 - Continue furosemide  40mg  q24 PRN - Cardiology visit scheduled for 4-5     Tobacco abuse Patient has history of tobacco use, currently smokes about 0.5ppd. She understands that smoking negatively impacts her health and expressed an interest in quitting. Discussed varenicline and patient would  like to start this medication.  - Encourage tobacco cessation  - Start varenicline 0.5mg  q24 for 3d then 0.5mg  q12 for 3d then 1mg  q12 for 77d      See Encounters Tab for problem based charting.  Patient discussed with Dr. Jimmye Norman

## 2022-03-29 ENCOUNTER — Encounter: Payer: Self-pay | Admitting: Student

## 2022-03-29 ENCOUNTER — Ambulatory Visit: Payer: Medicare HMO

## 2022-03-29 ENCOUNTER — Ambulatory Visit (INDEPENDENT_AMBULATORY_CARE_PROVIDER_SITE_OTHER): Payer: Medicare HMO | Admitting: Student

## 2022-03-29 ENCOUNTER — Other Ambulatory Visit: Payer: Self-pay

## 2022-03-29 VITALS — BP 134/61 | HR 75 | Temp 97.5°F | Ht 61.0 in | Wt 106.0 lb

## 2022-03-29 DIAGNOSIS — I639 Cerebral infarction, unspecified: Secondary | ICD-10-CM | POA: Diagnosis not present

## 2022-03-29 DIAGNOSIS — I5022 Chronic systolic (congestive) heart failure: Secondary | ICD-10-CM | POA: Diagnosis not present

## 2022-03-29 DIAGNOSIS — F17210 Nicotine dependence, cigarettes, uncomplicated: Secondary | ICD-10-CM

## 2022-03-29 DIAGNOSIS — Z720 Tobacco use: Secondary | ICD-10-CM

## 2022-03-29 MED ORDER — VARENICLINE TARTRATE 0.5 MG PO TABS: ORAL_TABLET | ORAL | 0 refills | Status: AC

## 2022-03-29 NOTE — Progress Notes (Signed)
Remote ICD transmission.   

## 2022-03-29 NOTE — Patient Instructions (Addendum)
  Thank you, Ms.Lindsay Camacho, for allowing [place] to provide your care today. Today we discussed . . .  > Stroke        - it is good to hear that your speech is improving, continue to practice speaking to help restore function       - continue to take your medications including aspirin, clopidogrel, and atorvastatin        - your cardiologists are working to give you another cholesterol medication called inclisiran or Leqvio       - follow up with neurology on 5-16  > Smoking       - we are sending you a prescription for varenicline which will help reduce cravings for cigarettes      - continue to work on reducing your intake   I have ordered the following labs for you:  Lab Orders  No laboratory test(s) ordered today      Tests ordered today:  none   Referrals ordered today:   Referral Orders  No referral(s) requested today      I have ordered the following medication/changed the following medications:   Stop the following medications: There are no discontinued medications.   Start the following medications: Meds ordered this encounter  Medications   varenicline (CHANTIX) 0.5 MG tablet    Sig: Take 1 tablet (0.5 mg total) by mouth daily for 3 days, THEN 1 tablet (0.5 mg total) 2 (two) times daily for 3 days, THEN 2 tablets (1 mg total) 2 (two) times daily.    Dispense:  317 tablet    Refill:  0      Follow up: 6 months    Remember:  Please continue to take your medications and let us know if you need assistance obtaining inclisiran. Slowly increase your dose of varenicline over time and take for a total of about 80 days. We will see you again in about six months!   Should you have any questions or concerns please call the internal medicine clinic at [phone].     [place] Nickel, MD [place]

## 2022-03-29 NOTE — Assessment & Plan Note (Signed)
Patient was hospitalized on 3-11 for acute left corona radiata infarction, discharged 3-14 on home medications. Since leaving the hospital, she has noticed an improvement in her ability to talk but still experiencing some delayed speech. Denies any palpitations or dizziness. Reports daily adherence to aspirin, clopidogrel, and atorvastatin. Most recent lipid panel collected 03-2022 demonstrated LDL 112. Previously taking bempedoic acid, but discontinued during hospitalization in favor of inclisiran recommended by neurology. Currently, she is working with her cardiologist to obtain this new medication.  - Continue clopidogrel 75mg  q24, aspirin 81mg  q24, and atorvastatin 80mg  q24 - Start inclisiran pending necessary authorization - Neurology visit scheduled for 5-16

## 2022-03-29 NOTE — Assessment & Plan Note (Signed)
Patient has history of heart failure post bypass graft and pacemaker. Managed with carvedilol, losartan, and spironolactone daily plus furosemide as needed. Denies breath shortness, leg swelling, and orthopnea. Exam negative for signs of fluid overload.   - Continue carvedilol 6.25mg  q12, losartan 25mg  q24, and spironolactone 12.5mg  q24 - Continue furosemide 40mg  q24 PRN - Cardiology visit scheduled for 4-5

## 2022-03-29 NOTE — Assessment & Plan Note (Signed)
Patient has history of tobacco use, currently smokes about 0.5ppd. She understands that smoking negatively impacts her health and expressed an interest in quitting. Discussed varenicline and patient would like to start this medication.  - Encourage tobacco cessation  - Start varenicline 0.5mg  q24 for 3d then 0.5mg  q12 for 3d then 1mg  q12 for 77d

## 2022-03-31 ENCOUNTER — Ambulatory Visit: Payer: Medicare HMO | Admitting: Student

## 2022-04-05 NOTE — Progress Notes (Signed)
Internal Medicine Clinic Attending  Case discussed with Dr. Harper  At the time of the visit.  We reviewed the resident's history and exam and pertinent patient test results.  I agree with the assessment, diagnosis, and plan of care documented in the resident's note.  

## 2022-04-07 ENCOUNTER — Ambulatory Visit: Payer: Medicare HMO | Admitting: Student

## 2022-04-10 ENCOUNTER — Ambulatory Visit (HOSPITAL_COMMUNITY)
Admission: RE | Admit: 2022-04-10 | Discharge: 2022-04-10 | Disposition: A | Discharge status: Home / Self Care | Payer: Medicare HMO | Source: Ambulatory Visit | Attending: Cardiovascular Disease | Admitting: Cardiovascular Disease

## 2022-04-10 DIAGNOSIS — I771 Stricture of artery: Secondary | ICD-10-CM | POA: Diagnosis present

## 2022-04-11 ENCOUNTER — Telehealth: Payer: Self-pay | Admitting: Cardiovascular Disease

## 2022-04-11 DIAGNOSIS — I771 Stricture of artery: Secondary | ICD-10-CM

## 2022-04-11 NOTE — Telephone Encounter (Signed)
Patient returned RN's call regarding results. 

## 2022-04-11 NOTE — Telephone Encounter (Signed)
Pt made aware of test results along with MD's recommendation Pt verbalized understanding Repeat order placed   Iran Ouch, MD 04/10/2022  6:13 PM EDT     Patent left subclavian artery stent but with moderate restenosis.  Repeat study in 1 year.  Follow-up with me as planned in August.

## 2022-04-11 NOTE — Addendum Note (Signed)
Addended by: Parke Poisson on: 04/11/2022 03:26 PM   Modules accepted: Orders

## 2022-05-09 ENCOUNTER — Ambulatory Visit: Payer: Medicare HMO | Attending: Cardiovascular Disease | Admitting: Cardiovascular Disease

## 2022-05-09 VITALS — BP 122/70 | HR 64 | Ht 61.0 in | Wt 101.0 lb

## 2022-05-09 DIAGNOSIS — E785 Hyperlipidemia, unspecified: Secondary | ICD-10-CM

## 2022-05-09 DIAGNOSIS — M79605 Pain in left leg: Secondary | ICD-10-CM

## 2022-05-09 DIAGNOSIS — M79604 Pain in right leg: Secondary | ICD-10-CM

## 2022-05-09 DIAGNOSIS — Z720 Tobacco use: Secondary | ICD-10-CM

## 2022-05-09 DIAGNOSIS — I25118 Atherosclerotic heart disease of native coronary artery with other forms of angina pectoris: Secondary | ICD-10-CM | POA: Diagnosis not present

## 2022-05-09 DIAGNOSIS — I771 Stricture of artery: Secondary | ICD-10-CM

## 2022-05-09 DIAGNOSIS — Z9889 Other specified postprocedural states: Secondary | ICD-10-CM

## 2022-05-09 DIAGNOSIS — I5022 Chronic systolic (congestive) heart failure: Secondary | ICD-10-CM

## 2022-05-09 NOTE — Patient Instructions (Signed)
Medication Instructions:  No changes *If you need a refill on your cardiac medications before your next appointment, please call your pharmacy*   Lab Work: None ordered If you have labs (blood work) drawn today and your tests are completely normal, you will receive your results only by: MyChart Message (if you have MyChart) OR A paper copy in the mail If you have any lab test that is abnormal or we need to change your treatment, we will call you to review the results.   Testing/Procedures: Your physician has requested that you have a lower extremity segmental doppler. This will take place at [address], [address].  Follow-Up: At [facility], you and your health needs are our priority.  As part of our continuing mission to provide you with exceptional heart care, we have created designated Provider Care Teams.  These Care Teams include your primary Cardiologist (physician) and Advanced Practice Providers (APPs -  Physician Assistants and Nurse Practitioners) who all work together to provide you with the care you need, when you need it.  We recommend signing up for the patient portal called "MyChart".  Sign up information is provided on this After Visit Summary.  MyChart is used to connect with patients for Virtual Visits (Telemedicine).  Patients are able to view lab/test results, encounter notes, upcoming appointments, etc.  Non-urgent messages can be sent to your provider as well.   To learn more about what you can do with MyChart, go to [email or web address].    Your next appointment:   6 month(s)  Provider:   Dr. Kirke Corin  Other Instructions A referral has been placed to pharmD to start Repatha or Praluent

## 2022-05-09 NOTE — Progress Notes (Signed)
Cardiology Office Note   Date:  05/10/2022   ID:  Lindsay Camacho, Lindsay Camacho 03/23/1965, MRN [id number]  PCP:  Marrianne Mood, MD  Cardiologist:  Dr. Eldridge Dace  No chief complaint on file.     History of Present Illness: Lindsay Camacho is a 57 y.o. female who is here today for follow-up visit regarding left subclavian artery stenosis status post stenting in September 2023. She has known history of coronary artery disease status post lateral MI in 2015 treated with PCI to the left circumflex.  Cardiac catheterization was complicated by retroperitoneal bleed requiring vascular surgery.  She has known history of chronic systolic heart failure and severe mitral regurgitation status post mitral valve repair in 2018.  There is prolonged history of tobacco use.  She is status post an ICD placement. She is followed for left subclavian stenosis status post stenting. 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  I performed successful angioplasty and balloon expandable stent placement to the left subclavian artery.   She was hospitalized in March with a stroke thought to be due to small vessel disease.  CTA showed widely patent left subclavian and stent with no obstructive disease in the carotid arteries.  Echocardiogram showed an EF of 55 to 60% with mild pulmonary hypertension and normal functioning mitral valve prosthesis. She was seen by the neurology team and her LDL was around 110.  They took her off Nexletol with plans to start a PCSK9 inhibitor but that has not happened yet.  She denies chest pain or worsening dyspnea.  She does complain of leg cramps on both sides worse on the left.   Past Medical History:  Diagnosis Date   Acute myocardial infarction of other lateral wall, initial episode of care 01/2013   DES CFX   Aortoiliac occlusive disease (HCC) 08/22/2016   CAD (coronary artery disease) 11/13/2011   50%  ? proximal LCx stenosis per Cath at [facility]   Chest pain, atypical, muscular skelatal   01/09/2014   COVID 02/08/2020   Dyspnea    Heart failure (HCC)    HLD (hyperlipidemia)    Hypertension    Intermediate coronary syndrome (HCC)    LBBB (left bundle branch block)    Mitral regurgitation    Nonischemic cardiomyopathy (HCC) 01/26/2017   Presence of combination internal cardiac defibrillator (ICD) and pacemaker    PVD (peripheral vascular disease) (HCC)    Right CFA stenosis per report on 11/14/2011   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/25/2016   Edwards McArthy-Adams IMR ETLogix ring annuloplasty (Model 4100, Serial [id number], size 26) placed via right mini thoracotomy approach   Stenosis of left subclavian artery (HCC) 08/22/2016   Stroke (HCC)    tia with no deficits    Past Surgical History:  Procedure Laterality Date   AORTIC ARCH ANGIOGRAPHY N/A 09/21/2021   Procedure: AORTIC ARCH ANGIOGRAPHY;  Surgeon: Iran Ouch, MD;  [facility]: [facility] INVASIVE CV LAB;  Service: Cardiovascular;  Laterality: N/A;   APPENDECTOMY     BIV ICD INSERTION CRT-D N/A 01/24/2017   Procedure: BIV ICD INSERTION CRT-D;  Surgeon: Marinus Maw, MD;  Location: [facility] INVASIVE CV LAB;  Service: Cardiovascular;  Laterality: N/A;   CARDIAC CATHETERIZATION  04/14/2013   Non-obstructive disease, patent CFX stent   CARDIAC CATHETERIZATION N/A 09/21/2014   Procedure: Left Heart Cath and Coronary Angiography;  Surgeon: Lennette Bihari, MD;  Location: [facility] INVASIVE CV LAB;  Service: Cardiovascular;  Laterality: N/A;   CORONARY ANGIOPLASTY WITH STENT PLACEMENT  01/17/2013   mild disease except 99% CFX, rx with  2.5 x 28 Alpine drug-eluting stent    GROIN DEBRIDEMENT Right 04/14/2013   Procedure: Emergency Evacuation of Retroperitoneal Hematoma and Repair Right External Iliac Artery Pseudoaneurysm    ;  Surgeon: Sherren Kerns, MD;  Location: [facility] OR;  Service: Vascular;  Laterality: Right;   LEFT HEART CATHETERIZATION  WITH CORONARY ANGIOGRAM N/A 01/18/2013   Procedure: LEFT HEART CATHETERIZATION WITH CORONARY ANGIOGRAM;  Surgeon: Corky Crafts, MD;  Location: [facility] CATH LAB;  Service: Cardiovascular;  Laterality: N/A;   LEFT HEART CATHETERIZATION WITH CORONARY ANGIOGRAM N/A 04/14/2013   Procedure: LEFT HEART CATHETERIZATION WITH CORONARY ANGIOGRAM;  Surgeon: Corky Crafts, MD;  Location: [facility] CATH LAB;  Service: Cardiovascular;  Laterality: N/A;   MITRAL VALVE REPAIR Right 08/25/2016   Procedure: MINIMALLY INVASIVE MITRAL VALVE REPAIR;  Surgeon: Purcell Nails, MD;  Location: [facility] OR;  Service: Open Heart Surgery;  Laterality: Right;   MULTIPLE EXTRACTIONS WITH ALVEOLOPLASTY N/A 08/23/2016   Procedure: Extraction of tooth #'s 17, 20-23, 26-29 with alveoloplasty;  Surgeon: [name], DDS;  Location: [facility] OR;  Service: Oral Surgery;  Laterality: N/A;   PERCUTANEOUS CORONARY STENT INTERVENTION (PCI-S)  01/18/2013   Procedure: PERCUTANEOUS CORONARY STENT INTERVENTION (PCI-S);  Surgeon: Corky Crafts, MD;  Location: [facility] CATH LAB;  Service: Cardiovascular;;   RIGHT/LEFT HEART CATH AND CORONARY ANGIOGRAPHY N/A 08/21/2016   Procedure: RIGHT/LEFT HEART CATH AND CORONARY ANGIOGRAPHY;  Surgeon: Kathleene Hazel, MD;  [facility]: [facility] INVASIVE CV LAB;  Service: Cardiovascular;  Laterality: N/A;   TEE WITHOUT CARDIOVERSION N/A 03/06/2014   Procedure: TRANSESOPHAGEAL ECHOCARDIOGRAM (TEE);  Surgeon: Lars Masson, MD;  Location[facility] ENDOSCOPY;  Service: Cardiovascular;  Laterality: N/A;   TEE WITHOUT CARDIOVERSION N/A 08/18/2016   Procedure: TRANSESOPHAGEAL ECHOCARDIOGRAM (TEE);  Surgeon: Lewayne Bunting, MD;  Location: [facility] ENDOSCOPY;  Service: Cardiovascular;  Laterality: N/A;   TEE WITHOUT CARDIOVERSION N/A 08/25/2016   Procedure: TRANSESOPHAGEAL ECHOCARDIOGRAM (TEE);  Surgeon: Purcell Nails, MD;  Location: [facility] OR;  Service: Open Heart Surgery;  Laterality: N/A;   TUBAL LIGATION     UPPER EXTREMITY  INTERVENTION  09/21/2021   Procedure: UPPER EXTREMITY INTERVENTION;  Surgeon: Iran Ouch, MD;  Location: [facility] INVASIVE CV LAB;  Service: Cardiovascular;;     Current Outpatient Medications  Medication Sig Dispense Refill   aspirin EC 81 MG tablet Take 81 mg by mouth daily.     atorvastatin (LIPITOR) 80 MG tablet Take 1 tablet (80 mg total) by mouth daily. 90 tablet 3   carvedilol (COREG) 6.25 MG tablet Take 1 tablet (6.25 mg total) by mouth 2 (two) times daily. 180 tablet 3   clopidogrel (PLAVIX) 75 MG tablet Take 1 tablet (75 mg total) by mouth daily. 90 tablet 3   ergocalciferol (VITAMIN D2) 1.25 MG (50000 UT) capsule Take 1 capsule (50,000 Units total) by mouth every Friday. (Patient taking differently: Take 50,000 Units by mouth once a week.) 4 capsule 6   furosemide (LASIX) 40 MG tablet Take 1 tablet (40 mg total) by mouth daily as needed for fluid or edema (shortness of breath). 90 tablet 3   losartan (COZAAR) 25 MG tablet Take 1 tablet (25 mg total) by mouth daily. 90 tablet 3   Menthol-Methyl Salicylate (SALONPAS PAIN RELIEF PATCH EX) Apply 1 patch topically daily as needed (pain).     nicotine (NICODERM CQ -  DOSED IN MG/24 HOURS) 14 mg/24hr patch Apply daily for 6 weeks, then decrease to 7mg  daily for 2 weeks 42 patch 0   oxyCODONE-acetaminophen (PERCOCET/ROXICET) 5-325 MG tablet Take 1 tablet by mouth every 6 (six) hours as needed for up to 10 doses for severe pain. 10 tablet 0   pantoprazole (PROTONIX) 40 MG tablet Take 1 tablet (40 mg total) by mouth daily. 90 tablet 3   spironolactone (ALDACTONE) 25 MG tablet Take 0.5 tablets (12.5 mg total) by mouth daily. 45 tablet 3   varenicline (CHANTIX) 0.5 MG tablet Take 1 tablet (0.5 mg total) by mouth daily for 3 days, THEN 1 tablet (0.5 mg total) 2 (two) times daily for 3 days, THEN 2 tablets (1 mg total) 2 (two) times daily. 317 tablet 0   No current facility-administered medications for this visit.    Allergies:   Aldomet  [methyldopa], Brilinta [ticagrelor], Other, Coumadin [warfarin sodium], Desyrel [trazodone], Eliquis [apixaban], Entresto [sacubitril-valsartan], Zestril [lisinopril], and Penicillins    Social History:  The patient  reports that she has been smoking cigarettes. She has been smoking an average of .5 packs per day. She has never used smokeless tobacco. She reports that she does not currently use drugs after having used the following drugs: Marijuana. She reports that she does not drink alcohol.   Family History:  The patient's family history includes Bladder Cancer in her mother; CAD (age of onset: 66) in her mother; CAD (age of onset: 66) in her father; Heart attack in her father and mother; Heart disease in her father and mother; Hypertension in her mother; Lung cancer in her mother; Stroke in her father and mother.    ROS:  Please see the history of present illness.   Otherwise, review of systems are positive for none.   All other systems are reviewed and negative.    PHYSICAL EXAM: VS:  BP 122/70   Pulse 64   Ht 5\' 1"  (1.549 m)   Wt 101 lb (45.8 kg)   SpO2 96%   BMI 19.08 kg/m  , BMI Body mass index is 19.08 kg/m. GEN: Well nourished, well developed, in no acute distress  HEENT: normal  Neck: no JVD,  or masses.  Right carotid bruit.  There is a bruit at the left subclavian artery area. Cardiac: RRR; no rubs, or gallops,no edema .  1 out of 6 systolic murmur at the base. Respiratory:  clear to auscultation bilaterally, normal work of breathing GI: soft, nontender, nondistended, + BS MS: no deformity or atrophy  Skin: warm and dry, no rash Neuro:  Strength and sensation are intact Psych: euthymic mood, full affect Vascular: Radial pulses +2 on both sides Femoral pulses normal bilaterally.  Distal pulses are normal on the right and slightly diminished on the left. Left radial pulse is normal.   EKG:  EKG is not ordered today. EKG showed atrial sensed ventricular paced  rhythm   Recent Labs: 08/14/2021: B Natriuretic Peptide 462.0 03/05/2022: ALT 16 03/14/2022: BUN 13; Creatinine, Ser 0.89; Hemoglobin 15.4; Magnesium 1.7; Platelets 169; Potassium 3.9; Sodium 138    Lipid Panel    Component Value Date/Time   CHOL 163 03/14/2022 0528   CHOL 132 03/07/2019 0923   TRIG 87 03/14/2022 0528   HDL 34 (L) 03/14/2022 0528   HDL 40 03/07/2019 0923   CHOLHDL 4.8 03/14/2022 0528   VLDL 17 03/14/2022 0528   LDLCALC 112 (H) 03/14/2022 0528   LDLCALC 79 03/07/2019 4098  Wt Readings from Last 3 Encounters:  05/09/22 101 lb (45.8 kg)  03/29/22 106 lb (48.1 kg)  03/14/22 103 lb (46.7 kg)           No data to display            ASSESSMENT AND PLAN:  1.  Left subclavian artery occlusion status post angioplasty and stent placement in September 2023: Resolution of left arm claudication and neurologic symptoms.  Recent CTA showed widely patent stent.  Velocities were moderately elevated by Doppler but her pulse on the left is normal.  Continue dual antiplatelet therapy.  2.  Coronary artery disease involving native coronary arteries with other forms of angina angina: She seems to be stable overall.  3.  Chronic systolic heart failure: She appears to be euvolemic.  Continue losartan and carvedilol.   4.  Status post mitral valve repair: Stable.  5.  Tobacco use: I discussed with her the importance of smoking cessation.  She has not been able to quit smoking even after she recently tried Chantix.  6.  Hyperlipidemia: She is on high-dose atorvastatin.  Recent lipid profile showed an LDL of 112.  She was taken off Nexletol by neurology in favor of starting a PCSK9 inhibitor.  I referred her to our pharmacy lipid clinic today to initiate this.  7.  Bilateral leg pain: Atypical claudication.  I requested lower extremity arterial Doppler.  Her pulses are slightly diminished on the left side.    Disposition:   follow up in 6 months  Signed,  Lorine Bears, MD  05/10/2022 10:32 AM    [facility]

## 2022-05-11 ENCOUNTER — Other Ambulatory Visit: Payer: Self-pay

## 2022-05-15 ENCOUNTER — Ambulatory Visit (INDEPENDENT_AMBULATORY_CARE_PROVIDER_SITE_OTHER): Payer: Medicare HMO

## 2022-05-15 DIAGNOSIS — I428 Other cardiomyopathies: Secondary | ICD-10-CM

## 2022-05-16 LAB — CUP PACEART REMOTE DEVICE CHECK
Battery Remaining Longevity: 19 mo
Battery Voltage: 2.9 V
Brady Statistic AP VP Percent: 0.08 %
Brady Statistic AP VS Percent: 0.01 %
Brady Statistic AS VP Percent: 99.79 %
Brady Statistic AS VS Percent: 0.11 %
Brady Statistic RA Percent Paced: 0.1 %
Brady Statistic RV Percent Paced: 99.58 %
Date Time Interrogation Session: 20240513104226
HighPow Impedance: 71 Ohm
Implantable Lead Connection Status: 753985
Implantable Lead Connection Status: 753985
Implantable Lead Connection Status: 753985
Implantable Lead Implant Date: 20190124
Implantable Lead Implant Date: 20190124
Implantable Lead Implant Date: 20190124
Implantable Lead Location: 753859
Implantable Lead Location: 753860
Implantable Lead Location: 753860
Implantable Lead Model: 3830
Implantable Lead Model: 5076
Implantable Lead Model: 6935
Implantable Pulse Generator Implant Date: 20190124
Lead Channel Impedance Value: 247 Ohm
Lead Channel Impedance Value: 304 Ohm
Lead Channel Impedance Value: 399 Ohm
Lead Channel Impedance Value: 437 Ohm
Lead Channel Impedance Value: 437 Ohm
Lead Channel Impedance Value: 513 Ohm
Lead Channel Pacing Threshold Amplitude: 0.625 V
Lead Channel Pacing Threshold Amplitude: 0.75 V
Lead Channel Pacing Threshold Amplitude: 1 V
Lead Channel Pacing Threshold Pulse Width: 0.4 ms
Lead Channel Pacing Threshold Pulse Width: 0.4 ms
Lead Channel Pacing Threshold Pulse Width: 0.5 ms
Lead Channel Sensing Intrinsic Amplitude: 1.375 mV
Lead Channel Sensing Intrinsic Amplitude: 1.375 mV
Lead Channel Sensing Intrinsic Amplitude: 8.125 mV
Lead Channel Sensing Intrinsic Amplitude: 8.125 mV
Lead Channel Setting Pacing Amplitude: 1.25 V
Lead Channel Setting Pacing Amplitude: 1.5 V
Lead Channel Setting Pacing Amplitude: 2.5 V
Lead Channel Setting Pacing Pulse Width: 0.4 ms
Lead Channel Setting Pacing Pulse Width: 0.5 ms
Lead Channel Setting Sensing Sensitivity: 0.3 mV
Zone Setting Status: 755011

## 2022-05-18 ENCOUNTER — Ambulatory Visit: Payer: Medicare HMO | Admitting: Diagnostic Neuroimaging

## 2022-05-18 ENCOUNTER — Telehealth: Payer: Self-pay | Admitting: Interventional Cardiology

## 2022-05-18 ENCOUNTER — Emergency Department (HOSPITAL_COMMUNITY): Payer: Medicare HMO

## 2022-05-18 ENCOUNTER — Encounter (HOSPITAL_COMMUNITY): Payer: Self-pay

## 2022-05-18 ENCOUNTER — Other Ambulatory Visit: Payer: Self-pay

## 2022-05-18 ENCOUNTER — Emergency Department (HOSPITAL_COMMUNITY)
Admission: EM | Admit: 2022-05-18 | Discharge: 2022-05-19 | Disposition: A | Discharge status: Home / Self Care | Payer: Medicare HMO | Attending: Emergency Medicine | Admitting: Emergency Medicine

## 2022-05-18 DIAGNOSIS — R799 Abnormal finding of blood chemistry, unspecified: Secondary | ICD-10-CM | POA: Insufficient documentation

## 2022-05-18 DIAGNOSIS — R0781 Pleurodynia: Secondary | ICD-10-CM | POA: Insufficient documentation

## 2022-05-18 LAB — BASIC METABOLIC PANEL
Anion gap: 10 (ref 5–15)
BUN: 9 mg/dL (ref 6–20)
CO2: 25 mmol/L (ref 22–32)
Calcium: 9.4 mg/dL (ref 8.9–10.3)
Chloride: 105 mmol/L (ref 98–111)
Creatinine, Ser: 0.89 mg/dL (ref 0.44–1.00)
GFR, Estimated: >60 mL/min (ref >60)
Glucose, Bld: 65 mg/dL — ABNORMAL LOW (ref 70–99)
Potassium: 4.5 mmol/L (ref 3.5–5.1)
Sodium: 140 mmol/L (ref 135–145)

## 2022-05-18 LAB — CBC WITH DIFFERENTIAL/PLATELET
Abs Immature Granulocytes: 0.03 10*3/uL (ref 0.00–0.07)
Basophils Absolute: 0 10*3/uL (ref 0.0–0.1)
Basophils Relative: 0 %
Eosinophils Absolute: 0.2 10*3/uL (ref 0.0–0.5)
Eosinophils Relative: 2 %
HCT: 48.5 % — ABNORMAL HIGH (ref 36.0–46.0)
Hemoglobin: 15.5 g/dL — ABNORMAL HIGH (ref 12.0–15.0)
Immature Granulocytes: 0 %
Lymphocytes Relative: 27 %
Lymphs Abs: 2.5 10*3/uL (ref 0.7–4.0)
MCH: 29.4 pg (ref 26.0–34.0)
MCHC: 32 g/dL (ref 30.0–36.0)
MCV: 91.9 fL (ref 80.0–100.0)
Monocytes Absolute: 0.8 10*3/uL (ref 0.1–1.0)
Monocytes Relative: 8 %
Neutro Abs: 5.5 10*3/uL (ref 1.7–7.7)
Neutrophils Relative %: 63 %
Platelets: 212 10*3/uL (ref 150–400)
RBC: 5.28 MIL/uL — ABNORMAL HIGH (ref 3.87–5.11)
RDW: 13.7 % (ref 11.5–15.5)
WBC: 9.1 10*3/uL (ref 4.0–10.5)
nRBC: 0 % (ref 0.0–0.2)

## 2022-05-18 LAB — TROPONIN I (HIGH SENSITIVITY)
Troponin I (High Sensitivity): 13 ng/L (ref <18)
Troponin I (High Sensitivity): 12 ng/L (ref <18)

## 2022-05-18 MED ORDER — FENTANYL CITRATE PF 50 MCG/ML IJ SOSY
50.0000 ug | PREFILLED_SYRINGE | Freq: Once | INTRAMUSCULAR | Status: AC
  Administered 2022-05-18: 50 ug via INTRAVENOUS
  Filled 2022-05-18: qty 1

## 2022-05-18 MED ORDER — HYDROCODONE-ACETAMINOPHEN 5-325 MG PO TABS
1.0000 | ORAL_TABLET | Freq: Once | ORAL | Status: AC
  Administered 2022-05-18: 1 via ORAL
  Filled 2022-05-18: qty 1

## 2022-05-18 NOTE — ED Triage Notes (Signed)
Pt came in for sharp Lt sided rib cage pain that started at 1000 yesterday, endorses light headedness & feeling dizzy, denies nausea, A/Ox4, rates her pain 7/10. Hx of heart attack, pacemaker & stroke (per pt).

## 2022-05-18 NOTE — ED Provider Notes (Signed)
[facility]-EMERGENCY DEPT [facility] Emergency Department Provider Note MRN:  [id number]  Arrival date & time: 05/19/22     Chief Complaint   Lambert Mody Lt sided Rib Cage Pain   History of Present Illness   Lindsay Camacho is a 57 y.o. year-old female presents to the ED with chief complaint of left sided rib pain.  States that the pain started yesterday.  She states that it hurts worse with movement. She denies any injuries.  She denies rash on her chest or back.  Denies cough or fever.  Denies SOB.  Only hurts with movement.  History provided by patient.   Review of Systems  Pertinent positive and negative review of systems noted in HPI.    Physical Exam   Vitals:   05/18/22 2315 05/18/22 2330  BP: (!) 155/82 (!) 173/83  Pulse: (!) 59 (!) 59  Resp: 18 16  Temp:    SpO2: 99% 99%    CONSTITUTIONAL:  non toxic-appearing, NAD NEURO:  Alert and oriented x 3, CN 3-12 grossly intact EYES:  eyes equal and reactive ENT/NECK:  Supple, no stridor  CARDIO:  normal rate, regular rhythm, appears well-perfused  PULM:  No respiratory distress, CTAB GI/GU:  non-distended,  MSK/SPINE:  No gross deformities, no edema, moves all extremities  SKIN:  no rash, atraumatic   *Additional and/or pertinent findings included in MDM below  Diagnostic and Interventional Summary    EKG Interpretation  Date/Time:    Ventricular Rate:    PR Interval:    QRS Duration:   QT Interval:    QTC Calculation:   R Axis:     Text Interpretation:         Labs Reviewed  CBC WITH DIFFERENTIAL/PLATELET - Abnormal; Notable for the following components:      Result Value   RBC 5.28 (*)    Hemoglobin 15.5 (*)    HCT 48.5 (*)    All other components within normal limits  BASIC METABOLIC PANEL - Abnormal; Notable for the following components:   Glucose, Bld 65 (*)    All other components within normal limits  CBG MONITORING, ED - Abnormal; Notable for the following components:   Glucose-Capillary 104 (*)     All other components within normal limits  D-DIMER, QUANTITATIVE  TROPONIN I (HIGH SENSITIVITY)  TROPONIN I (HIGH SENSITIVITY)    DG Ribs Unilateral W/Chest Left  Final Result      Medications  HYDROcodone-acetaminophen (NORCO/VICODIN) 5-325 MG per tablet 1 tablet (1 tablet Oral Given 05/18/22 1835)  fentaNYL (SUBLIMAZE) injection 50 mcg (50 mcg Intravenous Given 05/18/22 2341)     Procedures  /  Critical Care Procedures  ED Course and Medical Decision Making  I have reviewed the triage vital signs, the nursing notes, and pertinent available records from the EMR.  Social Determinants Affecting Complexity of Care: Patient has no clinically significant social determinants affecting this chief complaint..   ED Course:    Medical Decision Making Patient here with reproducible left rib pain x 2 days.  No fever or cough.  Doubt pneumonia.  Trops are flat at 13->12, doubt ACS.  D-dimer is normal, doubt PE.  No rash on skin, but I've told patient to monitor for this.  I wouldn't be surprised if this ends up being shingles due to the location of her pain.  CXR shows no rib injuries or pneumonia.  We discussed the trace right sided pleural effusion.  Glucose normalized after patient ate.  Amount and/or Complexity  of Data Reviewed Labs: ordered.  Risk Prescription drug management.     Consultants: No consultations were needed in caring for this patient.   Treatment and Plan: I considered admission due to patient's initial presentation, but after considering the examination and diagnostic results, patient will not require admission and can be discharged with outpatient follow-up.    Final Clinical Impressions(s) / ED Diagnoses     ICD-10-CM   1. Rib pain  R07.81       ED Discharge Orders          Ordered    ibuprofen (ADVIL) 600 MG tablet  Every 6 hours PRN        05/19/22 0048    cyclobenzaprine (FLEXERIL) 10 MG tablet  3 times daily PRN        05/19/22 0048               Discharge Instructions Discussed with and Provided to Patient:     Discharge Instructions      Keep a close eye out for rash.  If you notice a rash on the skin in the area you have pain, call your doctor or return, as this might be shingles.    In the meantime, we will treat this with antiinflammatories and a muscle relaxer.       Roxy Horseman, PA-C 05/19/22 0057    Sabas Sous, MD 05/19/22 0730

## 2022-05-18 NOTE — Telephone Encounter (Signed)
Pt c/o of Chest Pain: STAT if CP now or developed within 24 hours  1. Are you having CP right now? No   2. Are you experiencing any other symptoms (ex. SOB, nausea, vomiting, sweating)? Sob when having the little pain in her chest   3. How long have you been experiencing CP? Started yesterday   4. Is your CP continuous or coming and going? Coming and going, only when she is active  5. Have you taken Nitroglycerin? No  ?

## 2022-05-18 NOTE — Telephone Encounter (Signed)
Pt stated she is experiencing pain under her left breast to the left rib cage. Pt stated she called her PCP on 5/15, she was advised to go to the emergency room. For the past couple of days the patient also stated she did "break out in sweats all over her body". Explained ED cautions to the patient , pt stated she is in [place] and she will try to find someone to take her to the ED. Advised the patient she could call 911, pt stated she will not go to the ED near her, she will come to [facility] ED. Once again, explained ED precautions, patient voiced understanding.

## 2022-05-18 NOTE — ED Provider Triage Note (Signed)
Emergency Medicine Provider Triage Evaluation Note  Lindsay Camacho , a 57 y.o. female  was evaluated in triage.  Pt complains of left-sided rib pain.  Patient states she has been with symptoms since yesterday around 10 AM.  Reports feelings of dizziness/lightheadedness.,  Follows her cardiologist today who told her to come the emergency department for further assessment.  Reports constant pain since onset.  Reports some shortness of breath when pain gets "bad."  Denies fever, cough, abdominal pain, vomiting..  Review of Systems  Positive: See above Negative:   Physical Exam  BP (!) 128/58 (BP Location: Right Arm)   Pulse 69   Temp 98 F (36.7 C) (Oral)   Resp 18   SpO2 96%  Gen:   Awake, no distress   Resp:  Normal effort  MSK:   Moves extremities without difficulty  Other:    Medical Decision Making  Medically screening exam initiated at 6:43 PM.  Appropriate orders placed.  [name] was informed that the remainder of the evaluation will be completed by another provider, this initial triage assessment does not replace that evaluation, and the importance of remaining in the ED until their evaluation is complete.     Peter Garter, [place] 05/18/22 [phone]

## 2022-05-19 LAB — D-DIMER, QUANTITATIVE: D-Dimer, Quant: 0.46 ug/mL-FEU (ref 0.00–0.50)

## 2022-05-19 LAB — CBG MONITORING, ED: Glucose-Capillary: 104 mg/dL — ABNORMAL HIGH (ref 70–99)

## 2022-05-19 MED ORDER — IBUPROFEN 600 MG PO TABS: 600.0000 mg | ORAL_TABLET | Freq: Four times a day (QID) | ORAL | 0 refills | Status: DC | PRN

## 2022-05-19 MED ORDER — CYCLOBENZAPRINE HCL 10 MG PO TABS: 10.0000 mg | ORAL_TABLET | Freq: Three times a day (TID) | ORAL | 0 refills | Status: DC | PRN

## 2022-05-19 NOTE — Discharge Instructions (Addendum)
Keep a close eye out for rash.  If you notice a rash on the skin in the area you have pain, call your doctor or return, as this might be shingles.    In the meantime, we will treat this with antiinflammatories and a muscle relaxer.

## 2022-05-20 NOTE — Progress Notes (Unsigned)
Cardiology Office Note:    Date:  05/22/2022   ID:  Lindsay Camacho, DOB Oct 11, 1965, MRN [id number]  PCP:  Marrianne Mood, MD   [facility] Providers Cardiologist:  Lance Muss, MD Electrophysiologist:  Lewayne Bunting, MD     Referring MD: Marrianne Mood, MD   Chief Complaint: follow-up CAD  History of Present Illness:    Lindsay Camacho is a 57 y.o. 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 Kirke Corin.   History of lateral MI 2015 treated with PCI to the left circumflex.  Cardiac catheterization was complicated by retroperitoneal bleeding requiring vascular surgery. She had another [facility] prior to valve surgery 08/2016 that showed patent CFX stent with minimal restenosis + mild nonobstructive CAD in RCA and LAD. History systolic HF with EF 35-40%.   She underwent minimally invasive mitral valve repair per Dr. Cornelius Moras for severe symptomatic MR on 08/25/2016. Post op recovery fairly uneventful.   She underwent successful Medtronic ICD implantation on 01/24/2017 following echo that showed worsening EF 20 to 25%.   Admission [facility] on 01/17/19 with worsending SOB r/t A/C systolic heart failure. She was diuresed with IV lasix and was transitioned to PO.    Angiography September 2023 showed occluded proximal left subclavian artery with retrograde flow in the left vertebral artery.  She underwent successful angioplasty and balloon expandable stent placement to the left subclavian artery.  Admission March 2024 with stroke thought to be due to small vessel disease.  CTA showed widely patent left subclavian stent with no obstructive disease in the carotid arteries.  Echocardiogram showed EF 55 to 60% with mild pulmonary hypertension and normal functioning  mitral valve prosthesis.  Seen by Dr. Eldridge Dace 10/20/21 and reported she would be having a procedure on her cervix at [facility].  She reported regular walking with no cardiac symptoms.  Carvedilol was increased to 6.25 mg twice daily for elevated BP.  She was given nicotine patches for smoking cessation.  Hospital admission for stroke with echocardiogram 03/16/2022 revealed LVEF 55 to 60%, no RWMA, diastolic function could not be evaluated, mild LVH, mildly elevated pulmonary artery systolic pressure, mitral prosthetic ring present in mitral position with trivial MR, no mitral stenosis, mild to mod AS.  MRI revealed 8 mm acute infarct left corona radiata felt to be small vessel cardioembolic source.   Seen by Dr. Kirke Corin 05/09/22 at which time she complained of bilateral leg cramps worse on the left.  She was advised to undergo lower extremity segmental Doppler was referred to Pharm.D. for consideration of PCSK9 inhibitor therapy.  Today, she is here alone for follow-up. Reports she has a headache.  Usually gets a headache when BP is elevated.  She underwent lower extremity arterial studies today at our [facility].  Reports recent ED visit for pain throughout left rib cage. Feels like it has mostly resolved. Hs troponins were negative, D-dimer was normal.    Past Medical History:  Diagnosis Date   Acute myocardial infarction of other lateral wall, initial episode of care 01/2013   DES CFX   Aortoiliac occlusive disease (HCC) 08/22/2016   CAD (coronary artery disease) 11/13/2011   50% ? proximal LCx stenosis per Cath at [facility]   Chest pain, atypical, muscular skelatal  01/09/2014   COVID 02/08/2020   Dyspnea    Heart failure (HCC)    HLD (hyperlipidemia)    Hypertension    Intermediate coronary syndrome (HCC)    LBBB (left bundle branch block)    Mitral regurgitation    Nonischemic cardiomyopathy (HCC) 01/26/2017   Presence of combination internal cardiac defibrillator (ICD) and pacemaker     PVD (peripheral vascular disease) (HCC)    Right CFA stenosis per report on 11/14/2011   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/25/2016   Edwards McArthy-Adams IMR ETLogix ring annuloplasty (Model 4100, Serial [id number], size 26) placed via right mini thoracotomy approach   Stenosis of left subclavian artery (HCC) 08/22/2016   Stroke (HCC)    tia with no deficits    Past Surgical History:  Procedure Laterality Date   AORTIC ARCH ANGIOGRAPHY N/A 09/21/2021   Procedure: AORTIC ARCH ANGIOGRAPHY;  Surgeon: Iran Ouch, MD;  Location: [facility] INVASIVE CV LAB;  Service: Cardiovascular;  Laterality: N/A;   APPENDECTOMY     BIV ICD INSERTION CRT-D N/A 01/24/2017   Procedure: BIV ICD INSERTION CRT-D;  Surgeon: Marinus Maw, MD;  Location[facility] INVASIVE CV LAB;  Service: Cardiovascular;  Laterality: N/A;   CARDIAC CATHETERIZATION  04/14/2013   Non-obstructive disease, patent CFX stent   CARDIAC CATHETERIZATION N/A 09/21/2014   Procedure: Left Heart Cath and Coronary Angiography;  Surgeon: Lennette Bihari, MD;  Location: [facility] INVASIVE CV LAB;  Service: Cardiovascular;  Laterality: N/A;   CORONARY ANGIOPLASTY WITH STENT PLACEMENT  01/17/2013   mild disease except 99% CFX, rx with  2.5 x 28 Alpine drug-eluting stent    GROIN DEBRIDEMENT Right 04/14/2013   Procedure: Emergency Evacuation of Retroperitoneal Hematoma and Repair Right External Iliac Artery Pseudoaneurysm    ;  Surgeon: Sherren Kerns, MD;  Location: [facility] OR;  Service: Vascular;  Laterality: Right;   LEFT HEART CATHETERIZATION WITH CORONARY ANGIOGRAM N/A 01/18/2013   Procedure: LEFT HEART CATHETERIZATION WITH CORONARY ANGIOGRAM;  Surgeon: Corky Crafts, MD;  Location: [facility] CATH LAB;  Service: Cardiovascular;  Laterality: N/A;   LEFT HEART CATHETERIZATION WITH CORONARY ANGIOGRAM N/A 04/14/2013   Procedure: LEFT HEART CATHETERIZATION WITH CORONARY ANGIOGRAM;  Surgeon: Corky Crafts, MD;  Location: [facility] CATH LAB;  Service:  Cardiovascular;  Laterality: N/A;   MITRAL VALVE REPAIR Right 08/25/2016   Procedure: MINIMALLY INVASIVE MITRAL VALVE REPAIR;  Surgeon: Purcell Nails, MD;  Location: [facility] OR;  Service: Open Heart Surgery;  Laterality: Right;   MULTIPLE EXTRACTIONS WITH ALVEOLOPLASTY N/A 08/23/2016   Procedure: Extraction of tooth #'s 17, 20-23, 26-29 with alveoloplasty;  Surgeon: [name], DDS;  Location: [facility] OR;  Service: Oral Surgery;  Laterality: N/A;   PERCUTANEOUS CORONARY STENT INTERVENTION (PCI-S)  01/18/2013   Procedure: PERCUTANEOUS CORONARY STENT INTERVENTION (PCI-S);  Surgeon: Corky Crafts, MD;  Location: [facility] CATH LAB;  Service: Cardiovascular;;   RIGHT/LEFT HEART CATH AND CORONARY ANGIOGRAPHY N/A 08/21/2016   Procedure: RIGHT/LEFT HEART CATH AND CORONARY ANGIOGRAPHY;  Surgeon: Kathleene Hazel, MD;  Location: [facility] INVASIVE CV LAB;  Service: Cardiovascular;  Laterality: N/A;   TEE WITHOUT CARDIOVERSION N/A 03/06/2014   Procedure: TRANSESOPHAGEAL ECHOCARDIOGRAM (TEE);  Surgeon: Lars Masson, MD;  Location: [facility] ENDOSCOPY;  Service: Cardiovascular;  Laterality: N/A;   TEE WITHOUT CARDIOVERSION N/A 08/18/2016   Procedure: TRANSESOPHAGEAL ECHOCARDIOGRAM (TEE);  Surgeon: Lewayne Bunting, MD;  Location: [facility] ENDOSCOPY;  Service: Cardiovascular;  Laterality: N/A;   TEE WITHOUT CARDIOVERSION N/A 08/25/2016   Procedure:  TRANSESOPHAGEAL ECHOCARDIOGRAM (TEE);  Surgeon: Purcell Nails, MD;  Location: [facility] OR;  Service: Open Heart Surgery;  Laterality: N/A;   TUBAL LIGATION     UPPER EXTREMITY INTERVENTION  09/21/2021   Procedure: UPPER EXTREMITY INTERVENTION;  Surgeon: Iran Ouch, MD;  Location: [facility] INVASIVE CV LAB;  Service: Cardiovascular;;    Current Medications: Current Meds  Medication Sig   aspirin EC 81 MG tablet Take 81 mg by mouth daily.   atorvastatin (LIPITOR) 80 MG tablet Take 1 tablet (80 mg total) by mouth daily.   carvedilol (COREG) 6.25 MG tablet Take 1 tablet (6.25 mg  total) by mouth 2 (two) times daily.   clopidogrel (PLAVIX) 75 MG tablet Take 1 tablet (75 mg total) by mouth daily.   cyclobenzaprine (FLEXERIL) 10 MG tablet Take 1 tablet (10 mg total) by mouth 3 (three) times daily as needed for muscle spasms.   furosemide (LASIX) 40 MG tablet Take 1 tablet (40 mg total) by mouth daily as needed for fluid or edema (shortness of breath).   ibuprofen (ADVIL) 600 MG tablet Take 1 tablet (600 mg total) by mouth every 6 (six) hours as needed.   losartan (COZAAR) 25 MG tablet Take 1 tablet (25 mg total) by mouth daily.   Menthol-Methyl Salicylate (SALONPAS PAIN RELIEF PATCH EX) Apply 1 patch topically daily as needed (pain).   nicotine (NICODERM CQ - DOSED IN MG/24 HOURS) 14 mg/24hr patch Apply daily for 6 weeks, then decrease to 7mg  daily for 2 weeks   pantoprazole (PROTONIX) 40 MG tablet Take 1 tablet (40 mg total) by mouth daily.   spironolactone (ALDACTONE) 25 MG tablet Take 0.5 tablets (12.5 mg total) by mouth daily.   varenicline (CHANTIX) 0.5 MG tablet Take 1 tablet (0.5 mg total) by mouth daily for 3 days, THEN 1 tablet (0.5 mg total) 2 (two) times daily for 3 days, THEN 2 tablets (1 mg total) 2 (two) times daily.   [DISCONTINUED] ergocalciferol (VITAMIN D2) 1.25 MG (50000 UT) capsule Take 1 capsule (50,000 Units total) by mouth every Friday. (Patient taking differently: Take 50,000 Units by mouth once a week.)     Allergies:   Aldomet [methyldopa], Brilinta [ticagrelor], Other, Coumadin [warfarin sodium], Desyrel [trazodone], Eliquis [apixaban], Entresto [sacubitril-valsartan], Zestril [lisinopril], and Penicillins   Social History   Socioeconomic History   Marital status: Single    Spouse name: Not on file   Number of children: Not on file   Years of education: Not on file   Highest education level: Not on file  Occupational History   Not on file  Tobacco Use   Smoking status: Every Day    Packs/day: .5    Types: Cigarettes   Smokeless tobacco:  Never  Vaping Use   Vaping Use: Never used  Substance and Sexual Activity   Alcohol use: No    Alcohol/week: 0.0 standard drinks of alcohol   Drug use: Not Currently    Types: Marijuana   Sexual activity: Yes    Birth control/protection: None  Other Topics Concern   Not on file  Social History Narrative   Not on file   Social Determinants of Health   Financial Resource Strain: Not on file  Food Insecurity: No Food Insecurity (03/14/2022)   Hunger Vital Sign    Worried About Running Out of Food in the Last Year: Never true    Ran Out of Food in the Last Year: Never true  Transportation Needs: No Transportation Needs (03/14/2022)   PRAPARE -  Administrator, Civil Service (Medical): No    Lack of Transportation (Non-Medical): No  Physical Activity: Not on file  Stress: Not on file  Social Connections: Not on file     Family History: The patient's family history includes Bladder Cancer in her mother; CAD (age of onset: 64) in her mother; CAD (age of onset: 42) in her father; Heart attack in her father and mother; Heart disease in her father and mother; Hypertension in her mother; Lung cancer in her mother; Stroke in her father and mother.  ROS:   Please see the history of present illness.    + headache All other systems reviewed and are negative.  Labs/Other Studies Reviewed:    The following studies were reviewed today:  Echo 03/16/22 1. Left ventricular ejection fraction, by estimation, is 55 to 60%. The  left ventricle has normal function. The left ventricle has no regional  wall motion abnormalities. There is mild concentric left ventricular  hypertrophy. Left ventricular diastolic  function could not be evaluated.   2. Right ventricular systolic function is normal. The right ventricular  size is normal. There is mildly elevated pulmonary artery systolic  pressure. The estimated right ventricular systolic pressure is 38.5 mmHg.   3. Left atrial size was  mildly dilated.   4. The mitral valve is myxomatous. Trivial mitral valve regurgitation. No  evidence of mitral stenosis. The mean mitral valve gradient is 3.0 mmHg  with average heart rate of 62 bpm. There is a prosthetic annuloplasty ring  present in the mitral position.  Procedure Date: 08/25/16. Echo findings are consistent with normal  structure and function of the mitral valve prosthesis.   5. The tricuspid valve is abnormal. Tricuspid valve regurgitation is  severe.   6. The aortic valve is tricuspid. Aortic valve regurgitation is mild to  moderate. No aortic stenosis is present.  Peripheral Vascular Cath 09/21/21 1.  Occluded proximal left subclavian artery with retrograde flow in the left vertebral artery providing collaterals to the left arm. 2.  Successful angioplasty and balloon expandable stent placement to the left subclavian artery.   Recommendations: Given the patient's elevated blood pressure and previous history of retroperitoneal bleed, will observe overnight and likely discharge home tomorrow if she remains stable. She is already on dual antiplatelet therapy. Obtain carotid Doppler in the next 2 weeks.   LHC 08/21/16 Ost RCA to Prox RCA lesion, 10 %stenosed. Dist RCA lesion, 30 %stenosed. Prox Cx to Mid Cx lesion, 10 %stenosed. Prox LAD to Mid LAD lesion, 30 %stenosed.   1. Patent stent in the Circumflex artery with minimal restenosis 2. Mild non-obstructive disease in the RCA and LAD (of note, there was spasm in the RCA with catheter engagement that resolved with SL NTG)   Recommendations: Medical management of CAD. Continue planning for valve repair/replacement.    Recent Labs: 08/14/2021: B Natriuretic Peptide 462.0 03/05/2022: ALT 16 03/14/2022: Magnesium 1.7 05/18/2022: BUN 9; Creatinine, Ser 0.89; Hemoglobin 15.5; Platelets 212; Potassium 4.5; Sodium 140  Recent Lipid Panel    Component Value Date/Time   CHOL 163 03/14/2022 0528   CHOL 132 03/07/2019 0923    TRIG 87 03/14/2022 0528   HDL 34 (L) 03/14/2022 0528   HDL 40 03/07/2019 0923   CHOLHDL 4.8 03/14/2022 0528   VLDL 17 03/14/2022 0528   LDLCALC 112 (H) 03/14/2022 0528   LDLCALC 79 03/07/2019 0923     Risk Assessment/Calculations:  Physical Exam:    VS:  BP (!) 150/80   Pulse 69   Ht 5\' 1"  (1.549 m)   Wt 102 lb 9.6 oz (46.5 kg)   SpO2 97%   BMI 19.39 kg/m     Wt Readings from Last 3 Encounters:  05/22/22 102 lb 9.6 oz (46.5 kg)  05/18/22 100 lb 15.5 oz (45.8 kg)  05/09/22 101 lb (45.8 kg)     GEN: Chronically ill appearing in no acute distress HEENT: Normal NECK: No JVD; No carotid bruits CARDIAC: RRR, no murmurs, rubs, gallops RESPIRATORY:  Clear to auscultation without rales, wheezing or rhonchi  ABDOMEN: Soft, non-tender, non-distended MUSCULOSKELETAL:  No edema; No deformity. 2+ pedal pulses, equal bilaterally SKIN: Warm and dry NEUROLOGIC:  Alert and oriented x 3 PSYCHIATRIC:  Normal affect   EKG:  EKG is not ordered today.  HYPERTENSION CONTROL Vitals:   05/22/22 1411 05/22/22 1605  BP: (!) 162/86 (!) 150/80    The patient's blood pressure is elevated above target today.  In order to address the patient's elevated BP: Blood pressure will be monitored at home to determine if medication changes need to be made. (Has not taken medication)       Diagnoses:    1. Coronary artery disease involving native coronary artery of native heart without angina pectoris   2. ICD (implantable cardioverter-defibrillator) in place   3. Tobacco use   4. S/P minimally invasive mitral valve repair   5. Chronic systolic heart failure (HCC)   6. Hyperlipidemia LDL goal <50   7. Essential hypertension    Assessment and Plan:     CAD without angina: History of lateral MI 2015 treated with PCI to the left circumflex. Reports pain in left rib cage that is improving. Had negative troponin in ED on 05/18/22. Stable disease on most recent cath 2018.  No  concerning symptoms of angina presently. No bleeding concerns. Continue aspirin, atorvastatin, carvedilol, clopidogrel, losartan, spironolactone.  S/p mitral valve repair: Hx minimally invasive MVR 2018, stable function on TTE 03/2022. Continue SBE prophylaxis. No concerning symptoms today. Continue to monitor clinically.   Chronic HFrEF s/p ICD implant: Echo 03/16/2022 revealed normal LVEF 55 to 60%, mild LVH, unable to evaluate diastolic function, normal RV size with elevated RVSP. No dyspnea, orthopnea, PND, edema. She appears euvolemic on exam. Weight is stable. Continue GDMT including losartan, spironolactone, carvedilol. Device function stable on remote device check 05/15/2022.  Management per EP.  Hyperlipidemia LDL goal < 55: LDL 112 on 04/10/8117. Was previously on Nexlizet but was taken off, is unsure why. Has an appointment with Pharm.D. in lipid clinic next week for management of hyperlipidemia.  Tobacco abuse: Continues to smoke but has decreased to 2-3 cigs daily. Complete cessation advised.   Hypertension: BP is elevated today. She also has a headache which frequently occurs when BP is elevated.  She has not taken her medications today. Advised her to take medications as soon as she gets home. Encouraged her to monitor at home and notify us if BP consistent > 140 or > 90.  Renal function and electrolytes are stable on labs completed 05/18/22.     Disposition: Keep your September appointment with Dr. Ezequiel Ganser months with Dr. Eldridge Dace  Medication Adjustments/Labs and Tests Ordered: Current medicines are reviewed at length with the patient today.  Concerns regarding medicines are outlined above.  No orders of the defined types were placed in this encounter.  Meds ordered this encounter  Medications   ergocalciferol (VITAMIN  D2) 1.25 MG (50000 UT) capsule    Sig: Take 1 capsule (50,000 Units total) by mouth every Friday.    Dispense:  4 capsule    Refill:  6    Patient Instructions   Medication Instructions:   Your physician recommends that you continue on your current medications as directed. Please refer to the Current Medication list given to you today.   *If you need a refill on your cardiac medications before your next appointment, please call your pharmacy*   Lab Work:  None ordered.  If you have labs (blood work) drawn today and your tests are completely normal, you will receive your results only by: MyChart Message (if you have MyChart) OR A paper copy in the mail If you have any lab test that is abnormal or we need to change your treatment, we will call you to review the results.   Testing/Procedures:  None ordered.   Follow-Up: At [facility], you and your health needs are our priority.  As part of our continuing mission to provide you with exceptional heart care, we have created designated Provider Care Teams.  These Care Teams include your primary Cardiologist (physician) and Advanced Practice Providers (APPs -  Physician Assistants and Nurse Practitioners) who all work together to provide you with the care you need, when you need it.  We recommend signing up for the patient portal called "MyChart".  Sign up information is provided on this After Visit Summary.  MyChart is used to connect with patients for Virtual Visits (Telemedicine).  Patients are able to view lab/test results, encounter notes, upcoming appointments, etc.  Non-urgent messages can be sent to your provider as well.   To learn more about what you can do with MyChart, go to [email or web address].    Your next appointment:   9 month(s)  Provider:   Lance Muss, MD     Other Instructions  Your physician wants you to follow-up in: 9 months with Dr. Eldridge Dace.  You will receive a reminder letter in the mail two months in advance. If you don't receive a letter, please call our office to schedule the follow-up appointment.  HOW TO TAKE YOUR BLOOD PRESSURE  Rest 5  minutes before taking your blood pressure. Don't  smoke or drink caffeinated beverages for at least 30 minutes before. Take your blood pressure before (not after) you eat. Sit comfortably with your back supported and both feet on the floor ( don't cross your legs). Elevate your arm to heart level on a table or a desk. Use the proper sized cuff.  It should fit smoothly and snugly around your bare upper arm.  There should be  Enough room to slip a fingertip under the cuff.  The bottom edge of the cuff should be 1 inch above the crease Of the elbow. Please monitor your blood pressure once daily 2 hours after your am medication. If you blood pressure Consistently remains above 140 (systolic) top number or over 90 ( diastolic) bottom number X 3 days  Consecutively.  Please call our office at [phone] or send Mychart message.     ----Avoid cold medicines with D or DM at the end of them----        Signed, Levi Aland, NP  05/22/2022 4:16 PM    [facility]

## 2022-05-22 ENCOUNTER — Ambulatory Visit (INDEPENDENT_AMBULATORY_CARE_PROVIDER_SITE_OTHER): Payer: Medicare HMO | Admitting: Nurse Practitioner

## 2022-05-22 ENCOUNTER — Ambulatory Visit (HOSPITAL_COMMUNITY)
Admission: RE | Admit: 2022-05-22 | Discharge: 2022-05-22 | Disposition: A | Discharge status: Home / Self Care | Payer: Medicare HMO | Source: Ambulatory Visit | Attending: Cardiovascular Disease | Admitting: Cardiovascular Disease

## 2022-05-22 ENCOUNTER — Telehealth: Payer: Self-pay | Admitting: Pharmacy Technician

## 2022-05-22 ENCOUNTER — Other Ambulatory Visit: Payer: Self-pay | Admitting: Cardiovascular Disease

## 2022-05-22 ENCOUNTER — Encounter: Payer: Self-pay | Admitting: Nurse Practitioner

## 2022-05-22 VITALS — BP 150/80 | HR 69 | Ht 61.0 in | Wt 102.6 lb

## 2022-05-22 DIAGNOSIS — Z720 Tobacco use: Secondary | ICD-10-CM

## 2022-05-22 DIAGNOSIS — I2510 Atherosclerotic heart disease of native coronary artery without angina pectoris: Secondary | ICD-10-CM

## 2022-05-22 DIAGNOSIS — E785 Hyperlipidemia, unspecified: Secondary | ICD-10-CM

## 2022-05-22 DIAGNOSIS — I10 Essential (primary) hypertension: Secondary | ICD-10-CM | POA: Diagnosis present

## 2022-05-22 DIAGNOSIS — Z95810 Presence of automatic (implantable) cardiac defibrillator: Secondary | ICD-10-CM | POA: Diagnosis present

## 2022-05-22 DIAGNOSIS — I5022 Chronic systolic (congestive) heart failure: Secondary | ICD-10-CM | POA: Diagnosis present

## 2022-05-22 DIAGNOSIS — M79604 Pain in right leg: Secondary | ICD-10-CM | POA: Diagnosis present

## 2022-05-22 DIAGNOSIS — Z9889 Other specified postprocedural states: Secondary | ICD-10-CM | POA: Insufficient documentation

## 2022-05-22 DIAGNOSIS — M79605 Pain in left leg: Secondary | ICD-10-CM | POA: Diagnosis present

## 2022-05-22 LAB — VAS US LOWER EXT ART SEG MULTI (SEGMENTALS & LE RAYNAUDS)
Left ABI: 1.04
Right ABI: 1.04

## 2022-05-22 MED ORDER — ERGOCALCIFEROL 1.25 MG (50000 UT) PO CAPS: 50000.0000 [IU] | ORAL_CAPSULE | ORAL | 6 refills | Status: DC

## 2022-05-22 NOTE — Patient Instructions (Signed)
Medication Instructions:   Your physician recommends that you continue on your current medications as directed. Please refer to the Current Medication list given to you today.   *If you need a refill on your cardiac medications before your next appointment, please call your pharmacy*   Lab Work:  None ordered.  If you have labs (blood work) drawn today and your tests are completely normal, you will receive your results only by: MyChart Message (if you have MyChart) OR A paper copy in the mail If you have any lab test that is abnormal or we need to change your treatment, we will call you to review the results.   Testing/Procedures:  None ordered.   Follow-Up: At [facility], you and your health needs are our priority.  As part of our continuing mission to provide you with exceptional heart care, we have created designated Provider Care Teams.  These Care Teams include your primary Cardiologist (physician) and Advanced Practice Providers (APPs -  Physician Assistants and Nurse Practitioners) who all work together to provide you with the care you need, when you need it.  We recommend signing up for the patient portal called "MyChart".  Sign up information is provided on this After Visit Summary.  MyChart is used to connect with patients for Virtual Visits (Telemedicine).  Patients are able to view lab/test results, encounter notes, upcoming appointments, etc.  Non-urgent messages can be sent to your provider as well.   To learn more about what you can do with MyChart, go to [email or web address].    Your next appointment:   9 month(s)  Provider:   Lance Muss, MD     Other Instructions  Your physician wants you to follow-up in: 9 months with Dr. Eldridge Dace.  You will receive a reminder letter in the mail two months in advance. If you don't receive a letter, please call our office to schedule the follow-up appointment.  HOW TO TAKE YOUR BLOOD PRESSURE  Rest 5  minutes before taking your blood pressure. Don't  smoke or drink caffeinated beverages for at least 30 minutes before. Take your blood pressure before (not after) you eat. Sit comfortably with your back supported and both feet on the floor ( don't cross your legs). Elevate your arm to heart level on a table or a desk. Use the proper sized cuff.  It should fit smoothly and snugly around your bare upper arm.  There should be  Enough room to slip a fingertip under the cuff.  The bottom edge of the cuff should be 1 inch above the crease Of the elbow. Please monitor your blood pressure once daily 2 hours after your am medication. If you blood pressure Consistently remains above 140 (systolic) top number or over 90 ( diastolic) bottom number X 3 days  Consecutively.  Please call our office at [phone] or send Mychart message.     ----Avoid cold medicines with D or DM at the end of them----

## 2022-05-22 NOTE — Telephone Encounter (Signed)
Auth Submission: approved Site of care: Site of care: [facility] Payer: [employer] MEDICARE Medication & CPT/J Code(s) submitted: Leqvio (Inclisiran) [id number] Route of submission (phone, fax, portal):  Phone # Fax # Auth type: Buy/Bill Units/visits requested: 2 DOSES Reference number:  TRANS ID: [id number] CUSTOMER ID: [id number] Auth: [id number] 05/22/22 - 01/02/23

## 2022-05-23 ENCOUNTER — Telehealth: Payer: Self-pay | Admitting: Pharmacy Technician

## 2022-05-23 ENCOUNTER — Encounter: Payer: Self-pay | Admitting: Neurology

## 2022-05-23 NOTE — Telephone Encounter (Signed)
Dr. Pearlean Brownie, [facility] note:  Patient will be scheduled as soon as possible.  Auth Submission: APPROVED Site of care: Site of care: [facility] Payer: [employer] MEDICARE Medication & CPT/J Code(s) submitted: Leqvio (Inclisiran) [id number] Route of submission (phone, fax, portal): PORTAL Phone # Fax # Auth type: Buy/Bill Units/visits requested: 2 Reference number: [id number] Approval from: 05/22/22 to 01/02/23    [facility]: pending Lindsay Camacho has been notified

## 2022-06-02 ENCOUNTER — Ambulatory Visit: Payer: Medicare HMO | Attending: Internal Medicine

## 2022-06-02 ENCOUNTER — Encounter: Payer: Self-pay | Admitting: Interventional Cardiology

## 2022-06-02 ENCOUNTER — Ambulatory Visit (INDEPENDENT_AMBULATORY_CARE_PROVIDER_SITE_OTHER): Payer: Medicare HMO

## 2022-06-02 VITALS — BP 117/70 | HR 89 | Temp 97.6°F | Resp 18 | Ht 61.0 in | Wt 101.6 lb

## 2022-06-02 DIAGNOSIS — E785 Hyperlipidemia, unspecified: Secondary | ICD-10-CM | POA: Diagnosis not present

## 2022-06-02 MED ORDER — INCLISIRAN SODIUM 284 MG/1.5ML ~~LOC~~ SOSY
284.0000 mg | PREFILLED_SYRINGE | Freq: Once | SUBCUTANEOUS | Status: AC
  Administered 2022-06-02: 284 mg via SUBCUTANEOUS
  Filled 2022-06-02: qty 1.5

## 2022-06-02 NOTE — Progress Notes (Unsigned)
Patient ID: Lindsay Camacho                 DOB: 1965-09-04                    MRN: [id number]      HPI: Lindsay Camacho is a 57 y.o. female patient referred to lipid clinic by Dr.Arida PMH is significant for hx of MIin 2015 treated with PCI, CAD, HF, left subclavian artery stenosis status post stenting in September 2023,hx of stroke, severe mitral regurgitation status post mitral valve repair in 2018,prolonged history of tobacco use.   Reviewed options for lowering LDL cholesterol, including ezetimibe, PCSK-9 inhibitors, bempedoic acid and inclisiran.  Discussed mechanisms of action, dosing, side effects and potential decreases in LDL cholesterol.  Also reviewed cost information and potential options for patient assistance.  Current Medications:  Intolerances:  Risk Factors:  LDL goal:   Diet:   Exercise:   Family History:   Social History:   Labs: Lipid Panel     Component Value Date/Time   CHOL 163 03/14/2022 0528   CHOL 132 03/07/2019 0923   TRIG 87 03/14/2022 0528   HDL 34 (L) 03/14/2022 0528   HDL 40 03/07/2019 0923   CHOLHDL 4.8 03/14/2022 0528   VLDL 17 03/14/2022 0528   LDLCALC 112 (H) 03/14/2022 0528   LDLCALC 79 03/07/2019 0923   LABVLDL 13 03/07/2019 0923    Past Medical History:  Diagnosis Date   Acute myocardial infarction of other lateral wall, initial episode of care 01/2013   DES CFX   Aortoiliac occlusive disease (HCC) 08/22/2016   CAD (coronary artery disease) 11/13/2011   50% ? proximal LCx stenosis per Cath at [facility]   Chest pain, atypical, muscular skelatal   01/09/2014   COVID 02/08/2020   Dyspnea    Heart failure (HCC)    HLD (hyperlipidemia)    Hypertension    Intermediate coronary syndrome (HCC)    LBBB (left bundle branch block)    Mitral regurgitation    Nonischemic cardiomyopathy (HCC) 01/26/2017   Presence of combination internal cardiac defibrillator (ICD) and pacemaker    PVD (peripheral vascular disease) (HCC)    Right CFA stenosis  per report on 11/14/2011   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/25/2016   Edwards McArthy-Adams IMR ETLogix ring annuloplasty (Model 4100, Serial [id number], size 26) placed via right mini thoracotomy approach   Stenosis of left subclavian artery (HCC) 08/22/2016   Stroke (HCC)    tia with no deficits    Current Outpatient Medications on File Prior to Visit  Medication Sig Dispense Refill   aspirin EC 81 MG tablet Take 81 mg by mouth daily.     atorvastatin (LIPITOR) 80 MG tablet Take 1 tablet (80 mg total) by mouth daily. 90 tablet 3   carvedilol (COREG) 6.25 MG tablet Take 1 tablet (6.25 mg total) by mouth 2 (two) times daily. 180 tablet 3   clopidogrel (PLAVIX) 75 MG tablet Take 1 tablet (75 mg total) by mouth daily. 90 tablet 3   cyclobenzaprine (FLEXERIL) 10 MG tablet Take 1 tablet (10 mg total) by mouth 3 (three) times daily as needed for muscle spasms. 10 tablet 0   ergocalciferol (VITAMIN D2) 1.25 MG (50000 UT) capsule Take 1 capsule (50,000 Units total) by mouth every Friday. 4 capsule 6   furosemide (LASIX) 40 MG tablet Take 1 tablet (40 mg total) by mouth daily as needed for fluid or edema (shortness of breath).  90 tablet 3   ibuprofen (ADVIL) 600 MG tablet Take 1 tablet (600 mg total) by mouth every 6 (six) hours as needed. 30 tablet 0   losartan (COZAAR) 25 MG tablet Take 1 tablet (25 mg total) by mouth daily. 90 tablet 3   Menthol-Methyl Salicylate (SALONPAS PAIN RELIEF PATCH EX) Apply 1 patch topically daily as needed (pain).     nicotine (NICODERM CQ - DOSED IN MG/24 HOURS) 14 mg/24hr patch Apply daily for 6 weeks, then decrease to 7mg  daily for 2 weeks 42 patch 0   oxyCODONE-acetaminophen (PERCOCET/ROXICET) 5-325 MG tablet Take 1 tablet by mouth every 6 (six) hours as needed for up to 10 doses for severe pain. (Patient not taking: Reported on 05/22/2022) 10 tablet 0   pantoprazole (PROTONIX) 40 MG tablet Take 1 tablet (40 mg total) by mouth daily. 90 tablet 3    spironolactone (ALDACTONE) 25 MG tablet Take 0.5 tablets (12.5 mg total) by mouth daily. 45 tablet 3   varenicline (CHANTIX) 0.5 MG tablet Take 1 tablet (0.5 mg total) by mouth daily for 3 days, THEN 1 tablet (0.5 mg total) 2 (two) times daily for 3 days, THEN 2 tablets (1 mg total) 2 (two) times daily. 317 tablet 0   No current facility-administered medications on file prior to visit.    Allergies  Allergen Reactions   Aldomet [Methyldopa] Other (See Comments)    Severe hypotension   Brilinta [Ticagrelor] Shortness Of Breath   Other Other (See Comments)    Nuclear Stress Test Medication caused seizures   Coumadin [Warfarin Sodium] Swelling    Arm swelling   Desyrel [Trazodone] Other (See Comments)    Could not wake up from medication, comatosed   Eliquis [Apixaban] Other (See Comments)    BP got too low   Entresto [Sacubitril-Valsartan] Hypertension    Hypotension    Zestril [Lisinopril]     Significant blood pressure fluctuations    Penicillins Rash    Has patient had a PCN reaction causing immediate rash, facial/tongue/throat swelling, SOB or lightheadedness with hypotension: Yes Has patient had a PCN reaction causing severe rash involving mucus membranes or skin necrosis: No Has patient had a PCN reaction that required hospitalization: No Has patient had a PCN reaction occurring within the last 10 years: No If all of the above answers are "NO", then may proceed with Cephalosporin use.     Assessment/Plan:  1. Hyperlipidemia -  No problems updated. No problem-specific Assessment & Plan notes found for this encounter.    Thank you,  Carmela Hurt, Pharm.D [facility] A Division of [facility] [address] [place], [place] [address]  Phone: [phone]; Fax: [phone]

## 2022-06-02 NOTE — Patient Instructions (Signed)
Inclisiran Injection What is this medication? INCLISIRAN (in kli SIR an) treats high cholesterol. It works by decreasing bad cholesterol (such as LDL) in your blood. Changes to diet and exercise are often combined with this medication. This medicine may be used for other purposes; ask your health care provider or pharmacist if you have questions. COMMON BRAND NAME(S): LEQVIO What should I tell my care team before I take this medication? They need to know if you have any of these conditions: An unusual or allergic reaction to inclisiran, other medications, foods, dyes, or preservatives Pregnant or trying to get pregnant Breast-feeding How should I use this medication? This medication is injected under the skin. It is given by your care team in a hospital or clinic setting. Talk to your care team about the use of this medication in children. Special care may be needed. Overdosage: If you think you have taken too much of this medicine contact a poison control center or emergency room at once. NOTE: This medicine is only for you. Do not share this medicine with others. What if I miss a dose? Keep appointments for follow-up doses. It is important not to miss your dose. Call your care team if you are unable to keep an appointment. What may interact with this medication? Interactions are not expected. This list may not describe all possible interactions. Give your health care provider a list of all the medicines, herbs, non-prescription drugs, or dietary supplements you use. Also tell them if you smoke, drink alcohol, or use illegal drugs. Some items may interact with your medicine. What should I watch for while using this medication? Visit your care team for regular checks on your progress. Tell your care team if your symptoms do not start to get better or if they get worse. You may need blood work while you are taking this medication. What side effects may I notice from receiving this  medication? Side effects that you should report to your care team as soon as possible: Allergic reactions--skin rash, itching, hives, swelling of the face, lips, tongue, or throat Side effects that usually do not require medical attention (report these to your care team if they continue or are bothersome): Joint pain Pain, redness, or irritation at injection site This list may not describe all possible side effects. Call your doctor for medical advice about side effects. You may report side effects to FDA at 1-800-FDA-1088. Where should I keep my medication? This medication is given in a hospital or clinic. It will not be stored at home. NOTE: This sheet is a summary. It may not cover all possible information. If you have questions about this medicine, talk to your doctor, pharmacist, or health care provider.  2024 Elsevier/Gold Standard (2022-02-26 00:00:00)  

## 2022-06-02 NOTE — Progress Notes (Signed)
Diagnosis: Hyperlipidemia  Provider:  Mannam, Praveen MD  Procedure: Injection  Leqvio (inclisiran), Dose: 284 mg, Site: subcutaneous, Number of injections: 1  Post Care: Observation period completed  Discharge: Condition: Good, Destination: Home . AVS Provided  Performed by:  Jeannett Dekoning, RN       

## 2022-06-09 NOTE — Progress Notes (Signed)
Remote ICD transmission.   

## 2022-08-14 ENCOUNTER — Ambulatory Visit (INDEPENDENT_AMBULATORY_CARE_PROVIDER_SITE_OTHER): Payer: Medicare HMO

## 2022-08-14 DIAGNOSIS — I428 Other cardiomyopathies: Secondary | ICD-10-CM

## 2022-08-15 ENCOUNTER — Ambulatory Visit: Payer: Medicare HMO | Admitting: Cardiovascular Disease

## 2022-08-28 NOTE — Progress Notes (Signed)
Remote ICD transmission.   

## 2022-09-01 ENCOUNTER — Ambulatory Visit: Payer: Medicare HMO

## 2022-09-06 ENCOUNTER — Telehealth: Payer: Self-pay | Admitting: Internal Medicine

## 2022-09-06 NOTE — Telephone Encounter (Signed)
Spoke with pt reports was in AF all night felt like heart was racing away.  Did not check VS.  BP this morning 160/90-68.  Report had numbness or muscle spasms to chest last night.   When asking pt about left arm and leg numbness.  Noticed it taking pt extra amount of time to answer along with slurred speech.  Asked pt about slurred speech reports has had since a previous stroke but notices it is more than normal today.  Advised pt with reports of AF, left arm/ leg numbness and slurred speech to immediately hang up and call 911 for evaluation.  Pt expresses understanding.

## 2022-09-06 NOTE — Telephone Encounter (Signed)
Patient c/o Palpitations:  High priority if patient c/o lightheadedness, shortness of breath, or chest pain  How long have you had palpitations/irregular HR/ Afib? Are you having the symptoms now? Last night and not right now   Are you currently experiencing lightheadedness, SOB or CP? SOB  Do you have a history of afib (atrial fibrillation) or irregular heart rhythm? Yes   Have you checked your BP or HR? (document readings if available): HR-69   Are you experiencing any other symptoms? Left arm numbness, and left leg numbness

## 2022-09-14 ENCOUNTER — Encounter: Payer: Self-pay | Admitting: Internal Medicine

## 2022-09-14 ENCOUNTER — Ambulatory Visit: Payer: Medicare HMO | Attending: Internal Medicine | Admitting: Internal Medicine

## 2022-09-14 VITALS — BP 172/88 | HR 83 | Ht 61.0 in | Wt 105.0 lb

## 2022-09-14 DIAGNOSIS — I447 Left bundle-branch block, unspecified: Secondary | ICD-10-CM

## 2022-09-14 NOTE — Progress Notes (Signed)
HPI Ms. Fonnesbeck returns today for followup. She is a pleasant 57 yo woman with an ICM, chronic systolic heart failure, HTN, and peripheral vascular disease. The patient has had some HA and her bp has been elevated. She denies chest pain or sob.  Allergies  Allergen Reactions   Aldomet [Methyldopa] Other (See Comments)    Severe hypotension   Brilinta [Ticagrelor] Shortness Of Breath   Other Other (See Comments)    Nuclear Stress Test Medication caused seizures   Coumadin [Warfarin Sodium] Swelling    Arm swelling   Desyrel [Trazodone] Other (See Comments)    Could not wake up from medication, comatosed   Eliquis [Apixaban] Other (See Comments)    BP got too low   Entresto [Sacubitril-Valsartan] Hypertension    Hypotension    Zestril [Lisinopril]     Significant blood pressure fluctuations    Penicillins Rash    Has patient had a PCN reaction causing immediate rash, facial/tongue/throat swelling, SOB or lightheadedness with hypotension: Yes Has patient had a PCN reaction causing severe rash involving mucus membranes or skin necrosis: No Has patient had a PCN reaction that required hospitalization: No Has patient had a PCN reaction occurring within the last 10 years: No If all of the above answers are "NO", then may proceed with Cephalosporin use.      Current Outpatient Medications  Medication Sig Dispense Refill   aspirin EC 81 MG tablet Take 81 mg by mouth daily.     atorvastatin (LIPITOR) 80 MG tablet Take 1 tablet (80 mg total) by mouth daily. 90 tablet 3   carvedilol (COREG) 6.25 MG tablet Take 1 tablet (6.25 mg total) by mouth 2 (two) times daily. 180 tablet 3   clopidogrel (PLAVIX) 75 MG tablet Take 1 tablet (75 mg total) by mouth daily. 90 tablet 3   cyclobenzaprine (FLEXERIL) 10 MG tablet Take 1 tablet (10 mg total) by mouth 3 (three) times daily as needed for muscle spasms. 10 tablet 0   ergocalciferol (VITAMIN D2) 1.25 MG (50000 UT) capsule Take 1 capsule (50,000  Units total) by mouth every Friday. 4 capsule 6   furosemide (LASIX) 40 MG tablet Take 1 tablet (40 mg total) by mouth daily as needed for fluid or edema (shortness of breath). 90 tablet 3   ibuprofen (ADVIL) 600 MG tablet Take 1 tablet (600 mg total) by mouth every 6 (six) hours as needed. 30 tablet 0   losartan (COZAAR) 25 MG tablet Take 1 tablet (25 mg total) by mouth daily. 90 tablet 3   Menthol-Methyl Salicylate (SALONPAS PAIN RELIEF PATCH EX) Apply 1 patch topically daily as needed (pain).     nicotine (NICODERM CQ - DOSED IN MG/24 HOURS) 14 mg/24hr patch Apply daily for 6 weeks, then decrease to 7mg  daily for 2 weeks 42 patch 0   oxyCODONE-acetaminophen (PERCOCET/ROXICET) 5-325 MG tablet Take 1 tablet by mouth every 6 (six) hours as needed for up to 10 doses for severe pain. 10 tablet 0   pantoprazole (PROTONIX) 40 MG tablet Take 1 tablet (40 mg total) by mouth daily. 90 tablet 3   spironolactone (ALDACTONE) 25 MG tablet Take 0.5 tablets (12.5 mg total) by mouth daily. 45 tablet 3   No current facility-administered medications for this visit.     Past Medical History:  Diagnosis Date   Acute myocardial infarction of other lateral wall, initial episode of care 01/2013   DES CFX   Aortoiliac occlusive disease (HCC) 08/22/2016  CAD (coronary artery disease) 11/13/2011   50% ? proximal LCx stenosis per Cath at [facility]   Chest pain, atypical, muscular skelatal   01/09/2014   COVID 02/08/2020   Dyspnea    Heart failure (HCC)    HLD (hyperlipidemia)    Hypertension    Intermediate coronary syndrome (HCC)    LBBB (left bundle branch block)    Mitral regurgitation    Nonischemic cardiomyopathy (HCC) 01/26/2017   Presence of combination internal cardiac defibrillator (ICD) and pacemaker    PVD (peripheral vascular disease) (HCC)    Right CFA stenosis per report on 11/14/2011   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/25/2016   Edwards McArthy-Adams IMR ETLogix ring annuloplasty  (Model 4100, Serial [id number], size 26) placed via right mini thoracotomy approach   Stenosis of left subclavian artery (HCC) 08/22/2016   Stroke (HCC)    tia with no deficits    ROS:   All systems reviewed and negative except as noted in the HPI.   Past Surgical History:  Procedure Laterality Date   AORTIC ARCH ANGIOGRAPHY N/A 09/21/2021   Procedure: AORTIC ARCH ANGIOGRAPHY;  Surgeon: Iran Ouch, MD;  Location: [facility] INVASIVE CV LAB;  Service: Cardiovascular;  Laterality: N/A;   APPENDECTOMY     BIV ICD INSERTION CRT-D N/A 01/24/2017   Procedure: BIV ICD INSERTION CRT-D;  Surgeon: Marinus Maw, MD;  Location: [facility] INVASIVE CV LAB;  Service: Cardiovascular;  Laterality: N/A;   CARDIAC CATHETERIZATION  04/14/2013   Non-obstructive disease, patent CFX stent   CARDIAC CATHETERIZATION N/A 09/21/2014   Procedure: Left Heart Cath and Coronary Angiography;  Surgeon: Lennette Bihari, MD;  Location: [facility] INVASIVE CV LAB;  Service: Cardiovascular;  Laterality: N/A;   CORONARY ANGIOPLASTY WITH STENT PLACEMENT  01/17/2013   mild disease except 99% CFX, rx with  2.5 x 28 Alpine drug-eluting stent    GROIN DEBRIDEMENT Right 04/14/2013   Procedure: Emergency Evacuation of Retroperitoneal Hematoma and Repair Right External Iliac Artery Pseudoaneurysm    ;  Surgeon: Sherren Kerns, MD;  Location: [facility] OR;  Service: Vascular;  Laterality: Right;   LEFT HEART CATHETERIZATION WITH CORONARY ANGIOGRAM N/A 01/18/2013   Procedure: LEFT HEART CATHETERIZATION WITH CORONARY ANGIOGRAM;  Surgeon: Corky Crafts, MD;  Location: [facility] CATH LAB;  Service: Cardiovascular;  Laterality: N/A;   LEFT HEART CATHETERIZATION WITH CORONARY ANGIOGRAM N/A 04/14/2013   Procedure: LEFT HEART CATHETERIZATION WITH CORONARY ANGIOGRAM;  Surgeon: Corky Crafts, MD;  Location: [facility] CATH LAB;  Service: Cardiovascular;  Laterality: N/A;   MITRAL VALVE REPAIR Right 08/25/2016   Procedure: MINIMALLY INVASIVE MITRAL VALVE REPAIR;  Surgeon:  Purcell Nails, MD;  Location: [facility] OR;  Service: Open Heart Surgery;  Laterality: Right;   MULTIPLE EXTRACTIONS WITH ALVEOLOPLASTY N/A 08/23/2016   Procedure: Extraction of tooth #'s 17, 20-23, 26-29 with alveoloplasty;  Surgeon: [name], DDS;  Location: [facility] OR;  Service: Oral Surgery;  Laterality: N/A;   PERCUTANEOUS CORONARY STENT INTERVENTION (PCI-S)  01/18/2013   Procedure: PERCUTANEOUS CORONARY STENT INTERVENTION (PCI-S);  Surgeon: Corky Crafts, MD;  Location: [facility] CATH LAB;  Service: Cardiovascular;;   RIGHT/LEFT HEART CATH AND CORONARY ANGIOGRAPHY N/A 08/21/2016   Procedure: RIGHT/LEFT HEART CATH AND CORONARY ANGIOGRAPHY;  Surgeon: Kathleene Hazel, MD;  Location: [facility] INVASIVE CV LAB;  Service: Cardiovascular;  Laterality: N/A;   TEE WITHOUT CARDIOVERSION N/A 03/06/2014   Procedure: TRANSESOPHAGEAL ECHOCARDIOGRAM (TEE);  Surgeon: Lars Masson, MD;  Location: [facility] ENDOSCOPY;  Service: Cardiovascular;  Laterality: N/A;   TEE WITHOUT CARDIOVERSION N/A 08/18/2016   Procedure: TRANSESOPHAGEAL ECHOCARDIOGRAM (TEE);  Surgeon: Lewayne Bunting, MD;  Location: [facility] ENDOSCOPY;  Service: Cardiovascular;  Laterality: N/A;   TEE WITHOUT CARDIOVERSION N/A 08/25/2016   Procedure: TRANSESOPHAGEAL ECHOCARDIOGRAM (TEE);  Surgeon: Purcell Nails, MD;  Location: [facility] OR;  Service: Open Heart Surgery;  Laterality: N/A;   TUBAL LIGATION     UPPER EXTREMITY INTERVENTION  09/21/2021   Procedure: UPPER EXTREMITY INTERVENTION;  Surgeon: Iran Ouch, MD;  Location: [facility] INVASIVE CV LAB;  Service: Cardiovascular;;     Family History  Problem Relation Age of Onset   CAD Mother 84   Lung cancer Mother    Bladder Cancer Mother    Stroke Mother    Heart disease Mother        Before age 7   Hypertension Mother    Heart attack Mother    CAD Father 52   Heart disease Father        Before age 57   Heart attack Father    Stroke Father        Bleeding stroke     Social History    Socioeconomic History   Marital status: Single    Spouse name: Not on file   Number of children: Not on file   Years of education: Not on file   Highest education level: Not on file  Occupational History   Not on file  Tobacco Use   Smoking status: Every Day    Current packs/day: 0.50    Types: Cigarettes   Smokeless tobacco: Never  Vaping Use   Vaping status: Never Used  Substance and Sexual Activity   Alcohol use: No    Alcohol/week: 0.0 standard drinks of alcohol   Drug use: Not Currently    Types: Marijuana   Sexual activity: Yes    Birth control/protection: None  Other Topics Concern   Not on file  Social History Narrative   Not on file   Social Determinants of Health   Financial Resource Strain: Not on file  Food Insecurity: No Food Insecurity (03/14/2022)   Hunger Vital Sign    Worried About Running Out of Food in the Last Year: Never true    Ran Out of Food in the Last Year: Never true  Transportation Needs: No Transportation Needs (03/14/2022)   PRAPARE - Administrator, Civil Service (Medical): No    Lack of Transportation (Non-Medical): No  Physical Activity: Not on file  Stress: Not on file  Social Connections: Not on file  Intimate Partner Violence: Not At Risk (03/14/2022)   Humiliation, Afraid, Rape, and Kick questionnaire    Fear of Current or Ex-Partner: No    Emotionally Abused: No    Physically Abused: No    Sexually Abused: No     BP (!) 172/88   Pulse 83   Ht 5\' 1"  (1.549 m)   Wt 105 lb (47.6 kg)   SpO2 98%   BMI 19.84 kg/m   Physical Exam:  Well appearing NAD HEENT: Unremarkable Neck:  No JVD, no thyromegally Lymphatics:  No adenopathy Back:  No CVA tenderness Lungs:  Clear with no wheezes HEART:  Regular rate rhythm, no murmurs, no rubs, no clicks Abd:  soft, positive bowel sounds, no organomegally, no rebound, no guarding Ext:  2 plus pulses, no edema, no cyanosis, no clubbing Skin:  No rashes no nodules Neuro:   CN II through XII intact, motor  grossly intact  DEVICE  Normal device function.  See PaceArt for details.   Assess/Plan: Chronic systolic heart failure - her symptoms are class 2. She will continue her current meds.  HTN - her bp is elevated. It is back down on my recheck to 140/78 in both arms.  ICD - her Medtronic biv ICD is working normally. We will recheck in several months. Peripheral vascular disease - no difference in bp in each arm. She is s/p PCI of the subclavian with no evidence of restenosis.  Sharlot Gowda Radiance Deady,MD

## 2022-09-14 NOTE — Patient Instructions (Addendum)
Medication Instructions:  Your physician recommends that you continue on your current medications as directed. Please refer to the Current Medication list given to you today.  *If you need a refill on your cardiac medications before your next appointment, please call your pharmacy*  Lab Work: None ordered.  If you have labs (blood work) drawn today and your tests are completely normal, you will receive your results only by: MyChart Message (if you have MyChart) OR A paper copy in the mail If you have any lab test that is abnormal or we need to change your treatment, we will call you to review the results.  Testing/Procedures: None ordered.  Follow-Up: At [facility], you and your health needs are our priority.  As part of our continuing mission to provide you with exceptional heart care, we have created designated Provider Care Teams.  These Care Teams include your primary Cardiologist (physician) and Advanced Practice Providers (APPs -  Physician Assistants and Nurse Practitioners) who all work together to provide you with the care you need, when you need it.  Your next appointment:   6 months with Dr Anne Fu 1 year(s) with Dr Ladona Ridgel  The format for your next appointment:   In Person  Provider:   Lewayne Bunting, MD{or one of the following Advanced Practice Providers on your designated Care Team:   Francis Dowse, [place] Casimiro Needle "Mardelle Matte" [place], [place] Earnest Rosier, NP  Important Information About Sugar

## 2022-10-18 ENCOUNTER — Encounter: Payer: Self-pay | Admitting: Neurology

## 2022-10-23 ENCOUNTER — Telehealth: Payer: Self-pay | Admitting: Cardiology

## 2022-10-23 NOTE — Telephone Encounter (Signed)
Pt states her bp has been high lately. She also complained of arm pain and blurry vision (not currently, she states it comes and goes). Please advise.    Previous [place] pt

## 2022-10-23 NOTE — Telephone Encounter (Signed)
Left voicemail to return a call to office

## 2022-10-25 ENCOUNTER — Emergency Department (HOSPITAL_BASED_OUTPATIENT_CLINIC_OR_DEPARTMENT_OTHER)
Admission: EM | Admit: 2022-10-25 | Discharge: 2022-10-25 | Disposition: A | Discharge status: Home / Self Care | Payer: Medicare HMO | Attending: Emergency Medicine | Admitting: Emergency Medicine

## 2022-10-25 ENCOUNTER — Other Ambulatory Visit: Payer: Self-pay

## 2022-10-25 ENCOUNTER — Encounter (HOSPITAL_BASED_OUTPATIENT_CLINIC_OR_DEPARTMENT_OTHER): Payer: Self-pay | Admitting: Emergency Medicine

## 2022-10-25 ENCOUNTER — Emergency Department (HOSPITAL_BASED_OUTPATIENT_CLINIC_OR_DEPARTMENT_OTHER): Payer: Medicare HMO

## 2022-10-25 DIAGNOSIS — Z7982 Long term (current) use of aspirin: Secondary | ICD-10-CM | POA: Insufficient documentation

## 2022-10-25 DIAGNOSIS — I10 Essential (primary) hypertension: Secondary | ICD-10-CM | POA: Insufficient documentation

## 2022-10-25 DIAGNOSIS — F17200 Nicotine dependence, unspecified, uncomplicated: Secondary | ICD-10-CM | POA: Insufficient documentation

## 2022-10-25 DIAGNOSIS — I509 Heart failure, unspecified: Secondary | ICD-10-CM | POA: Insufficient documentation

## 2022-10-25 DIAGNOSIS — Z79899 Other long term (current) drug therapy: Secondary | ICD-10-CM | POA: Diagnosis not present

## 2022-10-25 DIAGNOSIS — H538 Other visual disturbances: Secondary | ICD-10-CM

## 2022-10-25 LAB — CBC
HCT: 45.6 % (ref 36.0–46.0)
Hemoglobin: 14.7 g/dL (ref 12.0–15.0)
MCH: 28.7 pg (ref 26.0–34.0)
MCHC: 32.2 g/dL (ref 30.0–36.0)
MCV: 89.1 fL (ref 80.0–100.0)
Platelets: 219 10*3/uL (ref 150–400)
RBC: 5.12 MIL/uL — ABNORMAL HIGH (ref 3.87–5.11)
RDW: 13.9 % (ref 11.5–15.5)
WBC: 8.3 10*3/uL (ref 4.0–10.5)
nRBC: 0 % (ref 0.0–0.2)

## 2022-10-25 LAB — BASIC METABOLIC PANEL
Anion gap: 6 (ref 5–15)
BUN: 8 mg/dL (ref 6–20)
CO2: 27 mmol/L (ref 22–32)
Calcium: 9.3 mg/dL (ref 8.9–10.3)
Chloride: 106 mmol/L (ref 98–111)
Creatinine, Ser: 0.82 mg/dL (ref 0.44–1.00)
GFR, Estimated: >60 mL/min (ref >60)
Glucose, Bld: 82 mg/dL (ref 70–99)
Potassium: 3.5 mmol/L (ref 3.5–5.1)
Sodium: 139 mmol/L (ref 135–145)

## 2022-10-25 LAB — MAGNESIUM: Magnesium: 1.7 mg/dL (ref 1.7–2.4)

## 2022-10-25 LAB — CBG MONITORING, ED: Glucose-Capillary: 126 mg/dL — ABNORMAL HIGH (ref 70–99)

## 2022-10-25 LAB — TROPONIN I (HIGH SENSITIVITY)
Troponin I (High Sensitivity): 11 ng/L (ref <18)
Troponin I (High Sensitivity): 12 ng/L (ref <18)

## 2022-10-25 MED ORDER — ASPIRIN 325 MG PO TBEC
325.0000 mg | DELAYED_RELEASE_TABLET | Freq: Once | ORAL | Status: AC
Start: 2022-10-25 — End: 2022-10-25
  Administered 2022-10-25: 325 mg via ORAL
  Filled 2022-10-25: qty 1

## 2022-10-25 MED ORDER — MECLIZINE HCL 25 MG PO TABS
25.0000 mg | ORAL_TABLET | Freq: Once | ORAL | Status: AC
  Administered 2022-10-25: 25 mg via ORAL
  Filled 2022-10-25: qty 1

## 2022-10-25 MED ORDER — MECLIZINE HCL 25 MG PO TABS
25.0000 mg | ORAL_TABLET | Freq: Three times a day (TID) | ORAL | 0 refills | Status: AC | PRN
Start: 2022-10-25 — End: 2022-11-08

## 2022-10-25 MED ORDER — LIDOCAINE 5 % EX PTCH
1.0000 | MEDICATED_PATCH | CUTANEOUS | Status: DC
  Administered 2022-10-25: 1 via TRANSDERMAL
  Filled 2022-10-25: qty 1

## 2022-10-25 MED ORDER — IOHEXOL 350 MG/ML SOLN
100.0000 mL | Freq: Once | INTRAVENOUS | Status: AC | PRN
  Administered 2022-10-25: 75 mL via INTRAVENOUS

## 2022-10-25 MED ORDER — KETOROLAC TROMETHAMINE 30 MG/ML IJ SOLN: 30.0000 mg | Freq: Once | INTRAMUSCULAR | Status: DC

## 2022-10-25 NOTE — ED Triage Notes (Signed)
Dizzy aND WEAK

## 2022-10-25 NOTE — Discharge Instructions (Addendum)
As discussed, your imaging is unremarkable.  Please follow-up with your cardiologist for your blood pressure.    Follow up with your PCP in 3 to 5 days for reevaluation of your symptoms.   I have attached information for ophthalmology.  Please make an appoint with them to follow-up for further evaluation of your intermittent left eye blurriness.  Return to the ED if your symptoms worsen in the interim.

## 2022-10-25 NOTE — ED Triage Notes (Signed)
For weeks she has also had intermittent pain to back of left eye

## 2022-10-25 NOTE — ED Triage Notes (Signed)
For few weeks, lines across her vision. Left axillary pain, blood pressure has been high. Last night awoke with right hand drawing up, confusion, and sweaty. This passed. Called cards today and she was told to come to [facility].

## 2022-10-25 NOTE — ED Provider Notes (Signed)
[place] EMERGENCY DEPARTMENT AT [facility] Provider Note   CSN: [id number] Arrival date & time: 10/25/22  1240     History {Add pertinent medical, surgical, social history, OB history to HPI:1} Chief Complaint  Patient presents with   Hypertension    Lindsay Camacho is a 57 y.o. female with a history of NSTEMI, CHF, and ischemic stroke who presents to the ED today for multiple concerns.  Patient reports she is having elevated blood pressure readings at home for the past week ranging from 160-197 systolically and 76-95 diastolically, despite her regular antihypertensive medication use.  Additionally, patient reports intermittent sporadic pains in the left armpit as well as intermittent pain and blurred vision of the left eye for the past week as well.  She states that yesterday when she woke up her right hand was drawn up to her chest and in a fist.  This lasted about several minutes and resolved on its own.  Immediately after, she felt diaphoretic and lightheaded with ambulation. She states that the lightheadedness has persisted since.  Patient called her cardiologist this morning told her to come to the ED to get evaluated. Upon speaking to the patient, she reports left-sided chest pain that is worse with deep inspiration.  No focal neuro deficits, confusion, or slurred speech. No additional complaints or concerns at this time.    Home Medications Prior to Admission medications   Medication Sig Start Date End Date Taking? Authorizing Provider  aspirin EC 81 MG tablet Take 81 mg by mouth daily.    [provider]  atorvastatin (LIPITOR) 80 MG tablet Take 1 tablet (80 mg total) by mouth daily. 09/07/20   Marinus Maw, MD  carvedilol (COREG) 6.25 MG tablet Take 1 tablet (6.25 mg total) by mouth 2 (two) times daily. 10/20/21   Corky Crafts, MD  clopidogrel (PLAVIX) 75 MG tablet Take 1 tablet (75 mg total) by mouth daily. 09/10/20   Marinus Maw, MD   cyclobenzaprine (FLEXERIL) 10 MG tablet Take 1 tablet (10 mg total) by mouth 3 (three) times daily as needed for muscle spasms. 05/19/22   Roxy Horseman, PA-C  ergocalciferol (VITAMIN D2) 1.25 MG (50000 UT) capsule Take 1 capsule (50,000 Units total) by mouth every Friday. 05/26/22   Iran Ouch, MD  furosemide (LASIX) 40 MG tablet Take 1 tablet (40 mg total) by mouth daily as needed for fluid or edema (shortness of breath). 03/05/20   Graciella Freer, PA-C  ibuprofen (ADVIL) 600 MG tablet Take 1 tablet (600 mg total) by mouth every 6 (six) hours as needed. 05/19/22   Roxy Horseman, PA-C  losartan (COZAAR) 25 MG tablet Take 1 tablet (25 mg total) by mouth daily. 02/07/22   Iran Ouch, MD  Menthol-Methyl Salicylate (SALONPAS PAIN RELIEF PATCH EX) Apply 1 patch topically daily as needed (pain).    [provider]  nicotine (NICODERM CQ - DOSED IN MG/24 HOURS) 14 mg/24hr patch Apply daily for 6 weeks, then decrease to 7mg  daily for 2 weeks 11/07/21   Corky Crafts, MD  oxyCODONE-acetaminophen (PERCOCET/ROXICET) 5-325 MG tablet Take 1 tablet by mouth every 6 (six) hours as needed for up to 10 doses for severe pain. 03/05/22   Mardene Sayer, MD  pantoprazole (PROTONIX) 40 MG tablet Take 1 tablet (40 mg total) by mouth daily. 02/07/22   Iran Ouch, MD  spironolactone (ALDACTONE) 25 MG tablet Take 0.5 tablets (12.5 mg total) by mouth daily. 02/07/22  Iran Ouch, MD      Allergies    Aldomet [methyldopa], Brilinta [ticagrelor], Other, Coumadin [warfarin sodium], Desyrel [trazodone], Eliquis [apixaban], Entresto [sacubitril-valsartan], Zestril [lisinopril], and Penicillins    Review of Systems   Review of Systems  Cardiovascular:  Positive for chest pain.  All other systems reviewed and are negative.   Physical Exam Updated Vital Signs BP (!) 159/72   Pulse 72   Temp 98.6 F (37 C)   Resp 20   Wt 46.3 kg   SpO2 100%   BMI 19.27 kg/m  Physical  Exam Vitals and nursing note reviewed.  Constitutional:      Appearance: Normal appearance.  HENT:     Head: Normocephalic and atraumatic.     Mouth/Throat:     Mouth: Mucous membranes are moist.  Eyes:     Extraocular Movements: Extraocular movements intact.     Conjunctiva/sclera: Conjunctivae normal.     Pupils: Pupils are equal, round, and reactive to light.     Comments: No nystagmus  Cardiovascular:     Rate and Rhythm: Normal rate and regular rhythm.     Pulses: Normal pulses.     Heart sounds: Normal heart sounds.  Pulmonary:     Effort: Pulmonary effort is normal.     Breath sounds: Normal breath sounds.  Abdominal:     Palpations: Abdomen is soft.     Tenderness: There is no abdominal tenderness.  Musculoskeletal:        General: No tenderness. Normal range of motion.     Right lower leg: No edema.     Left lower leg: No edema.  Skin:    General: Skin is warm and dry.     Findings: No rash.  Neurological:     General: No focal deficit present.     Mental Status: She is alert.     Sensory: No sensory deficit.     Motor: No weakness.     Gait: Gait normal.  Psychiatric:        Mood and Affect: Mood normal.        Behavior: Behavior normal.     Orthostatic Lying BP- Lying: 126/71 Pulse- Lying: 71 Orthostatic Sitting BP- Sitting: 138/59 Pulse- Sitting: 70 Orthostatic Standing at 0 minutes BP- Standing at 0 minutes: 121/67 Pulse- Standing at 0 minutes: 74 Orthostatic Standing at 3 minutes BP- Standing at 3 minutes: 140/69 Pulse- Standing at 3 minutes: 72   ED Results / Procedures / Treatments   Labs (all labs ordered are listed, but only abnormal results are displayed) Labs Reviewed  CBC - Abnormal; Notable for the following components:      Result Value   RBC 5.12 (*)    All other components within normal limits  CBG MONITORING, ED - Abnormal; Notable for the following components:   Glucose-Capillary 126 (*)    All other components within normal  limits  BASIC METABOLIC PANEL  TROPONIN I (HIGH SENSITIVITY)    EKG EKG Interpretation Date/Time:  Wednesday October 25 2022 13:00:12 EDT Ventricular Rate:  72 PR Interval:  144 QRS Duration:  130 QT Interval:  452 QTC Calculation: 494 R Axis:   -30  Text Interpretation: Atrial-sensed ventricular-paced rhythm Biventricular pacemaker detected Abnormal ECG When compared with ECG of 18-May-2022 18:16, Vent. rate has increased BY  12 BPM Confirmed by Blane Ohara [phone]) on 10/25/2022 1:29:39 PM  Radiology No results found.  Procedures Procedures  {Document cardiac monitor, telemetry assessment procedure when appropriate:1}  Medications  Ordered in ED Medications  ketorolac (TORADOL) 30 MG/ML injection 30 mg (has no administration in time range)    ED Course/ Medical Decision Making/ A&P   {   Click here for ABCD2, HEART and other calculatorsREFRESH Note before signing :1}                              Medical Decision Making Amount and/or Complexity of Data Reviewed Labs: ordered. Radiology: ordered.  Risk OTC drugs. Prescription drug management.   This patient presents to the ED for concern of multiple concerns, this involves an extensive number of treatment options, and is a complaint that carries with it a high risk of complications and morbidity.   Differential diagnosis includes: ACS, stroke, hypertensive urgency, hypertensive emergency, migraine, atypical migraine, electrolyte derangement, etc.   Comorbidities  See HPI above   Additional History  Additional history obtained from previous ED and cardiology notes.   Cardiac Monitoring / EKG  The patient was maintained on a cardiac monitor.  I personally viewed and interpreted the cardiac monitored which showed: atrial-sensed ventricular paced rhythm with a heart rate of 72 bpm.   Lab Tests  I ordered and personally interpreted labs.  The pertinent results include:   BMP, CBC, and magnesium are  unremarkable. Initial troponin of 11, repeat of 12.   Imaging Studies  I ordered imaging studies including CXR and CTA head  I independently visualized and interpreted imaging which showed:  CXR showed no acute cardiopulmonary disease. I agree with the radiologist interpretation   Consultations  I requested consultation with ***,  and discussed lab and imaging findings as well as pertinent plan - they recommend: ***   Problem List / ED Course / Critical Interventions / Medication Management  High blood pressure, intermittent blurred vision and muscle rigidity I ordered medications including: Aspirin 324 mg for chest pain  Reevaluation of the patient after these medicines showed that the patient improved. I have reviewed the patients home medicines and have made adjustments as needed   Social Determinants of Health  Tobacco use   Test / Admission - Considered  ***   {Document critical care time when appropriate:1} {Document review of labs and clinical decision tools ie heart score, Chads2Vasc2 etc:1}  {Document your independent review of radiology images, and any outside records:1} {Document your discussion with family members, caretakers, and with consultants:1} {Document social determinants of health affecting pt's care:1} {Document your decision making why or why not admission, treatments were needed:1} Final Clinical Impression(s) / ED Diagnoses Final diagnoses:  None    Rx / DC Orders ED Discharge Orders     None

## 2022-10-25 NOTE — Telephone Encounter (Signed)
Returned call to patient who states her R hand "drawed up into a fist" for about five minutes yesterday and she couldn't correct it. No problems since. Later in day yesterday, says she was driving at the time, then suddenly felt like she was going to pass out, dizziness, weakness all over, lasting 2-3 minutes, then symptoms spontaneously resolved. Reported arm pit pain on L side that's occurring sporadically and lasting just a few seconds, with episodes occurring 2-3 times per week. States she is having associated diaphoresis, but no n/v, no chest pain or tightness, but says "its hard to get air in when that is happening." Did have this pain occur yesterday, none today so far. Also condones blurred vision in her L eye that comes and goes everyday-states she is blurry in her L eye at time of call that has lasted since last night. Pt reports BP log: 10/22 (PM)187/86 10/20 166/76 10/19  197/87, repeat 197/95 10/18  160/79 10/17  198/95 BP at time of call 183/91, HR 79, has not taken any medications yet today. States she has been doubling her Carvedilol and taking 12.5mg  twice daily since being seen here 9/12 by Dr Ladona Ridgel.  Educated patient that she needs to go to ED to have labs and troponins done as well as a CT to evaluate for cause of blurred vision. She hesitantly agrees. Advised that it really sounds like more than elevated BP.

## 2022-11-06 ENCOUNTER — Telehealth: Payer: Self-pay | Admitting: Pharmacy Technician

## 2022-11-06 NOTE — Telephone Encounter (Signed)
Auth Submission: APPROVED [place] RENEWAL Site of care: Site of care: [facility] Payer: [employer] MEDICARE Medication & CPT/J Code(s) submitted: Leqvio (Inclisiran) [id number] Route of submission (phone, fax, portal): PORTAL Phone # Fax # Auth type: Buy/Bill PB Units/visits requested: 284MG  [id number] Reference number: [id number] Approval from: 01/03/23 to 01/02/24

## 2022-11-13 ENCOUNTER — Ambulatory Visit (INDEPENDENT_AMBULATORY_CARE_PROVIDER_SITE_OTHER): Payer: Medicare HMO

## 2022-11-13 DIAGNOSIS — I428 Other cardiomyopathies: Secondary | ICD-10-CM

## 2022-11-14 ENCOUNTER — Ambulatory Visit: Payer: Medicare HMO | Attending: Cardiovascular Disease | Admitting: Cardiovascular Disease

## 2022-11-14 ENCOUNTER — Encounter: Payer: Self-pay | Admitting: Cardiovascular Disease

## 2022-11-14 VITALS — BP 140/90 | HR 68 | Ht 61.0 in | Wt 109.0 lb

## 2022-11-14 DIAGNOSIS — I25118 Atherosclerotic heart disease of native coronary artery with other forms of angina pectoris: Secondary | ICD-10-CM | POA: Diagnosis not present

## 2022-11-14 DIAGNOSIS — Z720 Tobacco use: Secondary | ICD-10-CM

## 2022-11-14 DIAGNOSIS — I5022 Chronic systolic (congestive) heart failure: Secondary | ICD-10-CM | POA: Diagnosis not present

## 2022-11-14 DIAGNOSIS — I771 Stricture of artery: Secondary | ICD-10-CM

## 2022-11-14 DIAGNOSIS — E785 Hyperlipidemia, unspecified: Secondary | ICD-10-CM | POA: Diagnosis not present

## 2022-11-14 DIAGNOSIS — M79604 Pain in right leg: Secondary | ICD-10-CM

## 2022-11-14 DIAGNOSIS — M79605 Pain in left leg: Secondary | ICD-10-CM

## 2022-11-14 LAB — CUP PACEART REMOTE DEVICE CHECK
Battery Remaining Longevity: 15 mo
Battery Voltage: 2.89 V
Brady Statistic AP VP Percent: 0.1 %
Brady Statistic AP VS Percent: 0.01 %
Brady Statistic AS VP Percent: 99.77 %
Brady Statistic AS VS Percent: 0.12 %
Brady Statistic RA Percent Paced: 0.12 %
Brady Statistic RV Percent Paced: 99.69 %
Date Time Interrogation Session: 20241111022605
HighPow Impedance: 69 Ohm
Implantable Lead Connection Status: 753985
Implantable Lead Connection Status: 753985
Implantable Lead Connection Status: 753985
Implantable Lead Implant Date: 20190124
Implantable Lead Implant Date: 20190124
Implantable Lead Implant Date: 20190124
Implantable Lead Location: 753859
Implantable Lead Location: 753860
Implantable Lead Location: 753860
Implantable Lead Model: 3830
Implantable Lead Model: 5076
Implantable Lead Model: 6935
Implantable Pulse Generator Implant Date: 20190124
Lead Channel Impedance Value: 209 Ohm
Lead Channel Impedance Value: 266 Ohm
Lead Channel Impedance Value: 380 Ohm
Lead Channel Impedance Value: 437 Ohm
Lead Channel Impedance Value: 456 Ohm
Lead Channel Impedance Value: 494 Ohm
Lead Channel Pacing Threshold Amplitude: 0.625 V
Lead Channel Pacing Threshold Amplitude: 0.75 V
Lead Channel Pacing Threshold Amplitude: 1 V
Lead Channel Pacing Threshold Pulse Width: 0.4 ms
Lead Channel Pacing Threshold Pulse Width: 0.4 ms
Lead Channel Pacing Threshold Pulse Width: 0.5 ms
Lead Channel Sensing Intrinsic Amplitude: 1.625 mV
Lead Channel Sensing Intrinsic Amplitude: 1.625 mV
Lead Channel Sensing Intrinsic Amplitude: 7.375 mV
Lead Channel Sensing Intrinsic Amplitude: 7.375 mV
Lead Channel Setting Pacing Amplitude: 1.25 V
Lead Channel Setting Pacing Amplitude: 1.5 V
Lead Channel Setting Pacing Amplitude: 2.5 V
Lead Channel Setting Pacing Pulse Width: 0.4 ms
Lead Channel Setting Pacing Pulse Width: 0.5 ms
Lead Channel Setting Sensing Sensitivity: 0.3 mV
Zone Setting Status: 755011

## 2022-11-14 MED ORDER — LOSARTAN POTASSIUM 50 MG PO TABS: 50.0000 mg | ORAL_TABLET | Freq: Every day | ORAL | 1 refills | Status: DC

## 2022-11-14 NOTE — Progress Notes (Signed)
Cardiology Office Note   Date:  11/14/2022   ID:  Lindsay Camacho, DOB [birth year], MRN [id number]  PCP:  Marrianne Mood, MD  Cardiologist:  Dr. Eldridge Dace  No chief complaint on file.     History of Present Illness: Lindsay Camacho is a 57 y.o. female who is here today for follow-up visit regarding left subclavian artery stenosis status post stenting in September 2023. She has known history of coronary artery disease status post lateral MI in 2015 treated with PCI to the left circumflex.  Cardiac catheterization was complicated by retroperitoneal bleed requiring vascular surgery.  She has known history of chronic systolic heart failure and severe mitral regurgitation status post mitral valve repair in 2018.  There is prolonged history of tobacco use.  She is status post an ICD placement. She is followed for left subclavian stenosis status post stenting. 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  I performed successful angioplasty and balloon expandable stent placement to the left subclavian artery.   She was hospitalized in March, 2023 with a stroke thought to be due to small vessel disease.  CTA showed widely patent left subclavian and stent with no obstructive disease in the carotid arteries.  Echocardiogram showed an EF of 55 to 60% with mild pulmonary hypertension and normal functioning mitral valve prosthesis.  She had a recent emergency room visit for elevated blood pressure and blurred vision.  CTA was unremarkable.  She was recently started on inclisiran for hyperlipidemia and received 1 injection.  She does not want to take any further doses of this as it caused numbness in her legs.  No chest pain or shortness of breath.  Past Medical History:  Diagnosis Date   Acute myocardial infarction of other lateral wall, initial episode of care 01/2013   DES CFX   Aortoiliac occlusive disease (HCC)  08/22/2016   CAD (coronary artery disease) 11/13/2011   50% ? proximal LCx stenosis per Cath at [facility]   Chest pain, atypical, muscular skelatal   01/09/2014   COVID 02/08/2020   Dyspnea    Heart failure (HCC)    HLD (hyperlipidemia)    Hypertension    Intermediate coronary syndrome (HCC)    LBBB (left bundle branch block)    Mitral regurgitation    Nonischemic cardiomyopathy (HCC) 01/26/2017   Presence of combination internal cardiac defibrillator (ICD) and pacemaker    PVD (peripheral vascular disease) (HCC)    Right CFA stenosis per report on 11/14/2011   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/25/2016   Edwards McArthy-Adams IMR ETLogix ring annuloplasty (Model 4100, Serial [id number], size 26) placed via right mini thoracotomy approach   Stenosis of left subclavian artery (HCC) 08/22/2016   Stroke (HCC)    tia with no deficits    Past Surgical History:  Procedure Laterality Date   AORTIC ARCH ANGIOGRAPHY N/A 09/21/2021   Procedure: AORTIC ARCH ANGIOGRAPHY;  Surgeon: Iran Ouch, MD;  Location: [facility] INVASIVE CV LAB;  Service: Cardiovascular;  Laterality: N/A;   APPENDECTOMY     BIV ICD INSERTION CRT-D N/A 01/24/2017   Procedure: BIV ICD INSERTION CRT-D;  Surgeon: Marinus Maw, MD;  Location: [facility] INVASIVE CV LAB;  Service: Cardiovascular;  Laterality: N/A;   CARDIAC CATHETERIZATION  04/14/2013   Non-obstructive disease, patent CFX stent   CARDIAC CATHETERIZATION N/A 09/21/2014   Procedure: Left Heart Cath and Coronary Angiography;  Surgeon: Lennette Bihari, MD;  Location: [facility] INVASIVE CV LAB;  Service: Cardiovascular;  Laterality: N/A;   CORONARY ANGIOPLASTY WITH STENT PLACEMENT  01/17/2013   mild disease except 99% CFX, rx with  2.5 x 28 Alpine drug-eluting stent    GROIN DEBRIDEMENT Right 04/14/2013   Procedure: Emergency Evacuation of Retroperitoneal Hematoma and Repair Right External Iliac Artery Pseudoaneurysm    ;  Surgeon: Sherren Kerns, MD;  Location: [facility] OR;   Service: Vascular;  Laterality: Right;   LEFT HEART CATHETERIZATION WITH CORONARY ANGIOGRAM N/A 01/18/2013   Procedure: LEFT HEART CATHETERIZATION WITH CORONARY ANGIOGRAM;  Surgeon: Corky Crafts, MD;  Location: [facility] CATH LAB;  Service: Cardiovascular;  Laterality: N/A;   LEFT HEART CATHETERIZATION WITH CORONARY ANGIOGRAM N/A 04/14/2013   Procedure: LEFT HEART CATHETERIZATION WITH CORONARY ANGIOGRAM;  Surgeon: Corky Crafts, MD;  Location: [facility] CATH LAB;  Service: Cardiovascular;  Laterality: N/A;   MITRAL VALVE REPAIR Right 08/25/2016   Procedure: MINIMALLY INVASIVE MITRAL VALVE REPAIR;  Surgeon: Purcell Nails, MD;  Location: [facility] OR;  Service: Open Heart Surgery;  Laterality: Right;   MULTIPLE EXTRACTIONS WITH ALVEOLOPLASTY N/A 08/23/2016   Procedure: Extraction of tooth #'s 17, 20-23, 26-29 with alveoloplasty;  Surgeon: [name], DDS;  Location: [facility] OR;  Service: Oral Surgery;  Laterality: N/A;   PERCUTANEOUS CORONARY STENT INTERVENTION (PCI-S)  01/18/2013   Procedure: PERCUTANEOUS CORONARY STENT INTERVENTION (PCI-S);  Surgeon: Corky Crafts, MD;  Location: [facility] CATH LAB;  Service: Cardiovascular;;   RIGHT/LEFT HEART CATH AND CORONARY ANGIOGRAPHY N/A 08/21/2016   Procedure: RIGHT/LEFT HEART CATH AND CORONARY ANGIOGRAPHY;  Surgeon: Kathleene Hazel, MD;  Location: [facility] INVASIVE CV LAB;  Service: Cardiovascular;  Laterality: N/A;   TEE WITHOUT CARDIOVERSION N/A 03/06/2014   Procedure: TRANSESOPHAGEAL ECHOCARDIOGRAM (TEE);  Surgeon: Lars Masson, MD;  Location: [facility] ENDOSCOPY;  Service: Cardiovascular;  Laterality: N/A;   TEE WITHOUT CARDIOVERSION N/A 08/18/2016   Procedure: TRANSESOPHAGEAL ECHOCARDIOGRAM (TEE);  Surgeon: Lewayne Bunting, MD;  Location: [facility] ENDOSCOPY;  Service: Cardiovascular;  Laterality: N/A;   TEE WITHOUT CARDIOVERSION N/A 08/25/2016   Procedure: TRANSESOPHAGEAL ECHOCARDIOGRAM (TEE);  Surgeon: Purcell Nails, MD;  Location: [facility] OR;  Service: Open Heart  Surgery;  Laterality: N/A;   TUBAL LIGATION     UPPER EXTREMITY INTERVENTION  09/21/2021   Procedure: UPPER EXTREMITY INTERVENTION;  Surgeon: Iran Ouch, MD;  Location: [facility] INVASIVE CV LAB;  Service: Cardiovascular;;     Current Outpatient Medications  Medication Sig Dispense Refill   aspirin EC 81 MG tablet Take 81 mg by mouth daily.     atorvastatin (LIPITOR) 80 MG tablet Take 1 tablet (80 mg total) by mouth daily. 90 tablet 3   carvedilol (COREG) 6.25 MG tablet Take 1 tablet (6.25 mg total) by mouth 2 (two) times daily. 180 tablet 3   clopidogrel (PLAVIX) 75 MG tablet Take 1 tablet (75 mg total) by mouth daily. 90 tablet 3   cyclobenzaprine (FLEXERIL) 10 MG tablet Take 1 tablet (10 mg total) by mouth 3 (three) times daily as needed for muscle spasms. 10 tablet 0   furosemide (LASIX) 40 MG tablet Take 1 tablet (40 mg total) by mouth daily as needed for fluid or edema (shortness of breath). 90 tablet 3   ibuprofen (ADVIL) 600 MG tablet Take 1 tablet (600 mg total) by mouth every 6 (six) hours as needed. 30 tablet 0   losartan (COZAAR) 25 MG tablet Take 1 tablet (25 mg total) by mouth daily. 90 tablet 3  Menthol-Methyl Salicylate (SALONPAS PAIN RELIEF PATCH EX) Apply 1 patch topically daily as needed (pain).     pantoprazole (PROTONIX) 40 MG tablet Take 1 tablet (40 mg total) by mouth daily. 90 tablet 3   spironolactone (ALDACTONE) 25 MG tablet Take 0.5 tablets (12.5 mg total) by mouth daily. 45 tablet 3   ergocalciferol (VITAMIN D2) 1.25 MG (50000 UT) capsule Take 1 capsule (50,000 Units total) by mouth every Friday. (Patient not taking: Reported on 11/14/2022) 4 capsule 6   nicotine (NICODERM CQ - DOSED IN MG/24 HOURS) 14 mg/24hr patch Apply daily for 6 weeks, then decrease to 7mg  daily for 2 weeks (Patient not taking: Reported on 11/14/2022) 42 patch 0   oxyCODONE-acetaminophen (PERCOCET/ROXICET) 5-325 MG tablet Take 1 tablet by mouth every 6 (six) hours as needed for up to 10 doses  for severe pain. (Patient not taking: Reported on 11/14/2022) 10 tablet 0   No current facility-administered medications for this visit.    Allergies:   Aldomet [methyldopa], Brilinta [ticagrelor], Other, Coumadin [warfarin sodium], Desyrel [trazodone], Eliquis [apixaban], Entresto [sacubitril-valsartan], Zestril [lisinopril], and Penicillins    Social History:  The patient  reports that she has been smoking cigarettes. She has never used smokeless tobacco. She reports that she does not currently use drugs after having used the following drugs: Marijuana. She reports that she does not drink alcohol.   Family History:  The patient's family history includes Bladder Cancer in her mother; CAD (age of onset: 1) in her mother; CAD (age of onset: 68) in her father; Heart attack in her father and mother; Heart disease in her father and mother; Hypertension in her mother; Lung cancer in her mother; Stroke in her father and mother.    ROS:  Please see the history of present illness.   Otherwise, review of systems are positive for none.   All other systems are reviewed and negative.    PHYSICAL EXAM: VS:  BP (!) 140/90 (BP Location: Right Arm, Patient Position: Sitting, Cuff Size: Normal)   Pulse 68   Ht 5\' 1"  (1.549 m)   Wt 109 lb (49.4 kg)   SpO2 98%   BMI 20.60 kg/m  , BMI Body mass index is 20.6 kg/m. GEN: Well nourished, well developed, in no acute distress  HEENT: normal  Neck: no JVD,  or masses.  Right carotid bruit.  There is a bruit at the left subclavian artery area. Cardiac: RRR; no rubs, or gallops,no edema .  1/ oh6 systolic murmur at the base. Respiratory:  clear to auscultation bilaterally, normal work of breathing GI: soft, nontender, nondistended, + BS MS: no deformity or atrophy  Skin: warm and dry, no rash Neuro:  Strength and sensation are intact Psych: euthymic mood, full affect Vascular: Radial pulses +2 on both sides    EKG:  EKG is not ordered today.  Recent  Labs: 03/05/2022: ALT 16 10/25/2022: BUN 8; Creatinine, Ser 0.82; Hemoglobin 14.7; Magnesium 1.7; Platelets 219; Potassium 3.5; Sodium 139    Lipid Panel    Component Value Date/Time   CHOL 163 03/14/2022 0528   CHOL 132 03/07/2019 0923   TRIG 87 03/14/2022 0528   HDL 34 (L) 03/14/2022 0528   HDL 40 03/07/2019 0923   CHOLHDL 4.8 03/14/2022 0528   VLDL 17 03/14/2022 0528   LDLCALC 112 (H) 03/14/2022 0528   LDLCALC 79 03/07/2019 0923      Wt Readings from Last 3 Encounters:  11/14/22 109 lb (49.4 kg)  10/25/22 102 lb (46.3  kg)  09/14/22 105 lb (47.6 kg)           No data to display            ASSESSMENT AND PLAN:  1.  Left subclavian artery occlusion status post angioplasty and stent placement in September 2023: Resolution of left arm claudication and neurologic symptoms.  Her left radial pulse is normal and there is no evidence of obstructive disease on the left.  2.  Coronary artery disease involving native coronary arteries with other forms of angina angina: She seems to be stable overall.  3.  Chronic systolic heart failure: She appears to be euvolemic.  Continue losartan and carvedilol.  Due to elevated blood pressure, increase losartan to 50 mg once daily.  4.  Status post mitral valve repair: Stable.  5.  Tobacco use: I discussed with her the importance of smoking cessation.  She has not been able to quit smoking even after she recently tried Chantix.  6.  Hyperlipidemia: She is on high-dose atorvastatin.  She received 1 dose of inclisiran but she does not want to take any further dosages of this due to lower extremity numbness.  7.  Bilateral leg pain: Lower extremity arterial Doppler showed normal ABI bilaterally.    Disposition:   follow up in 6 months  Signed,  Lorine Bears, MD  11/14/2022 11:08 AM    [facility]

## 2022-11-14 NOTE — Patient Instructions (Signed)
Medication Instructions:  INCREASE the Losartan to 50 mg once daily  *If you need a refill on your cardiac medications before your next appointment, please call your pharmacy*   Lab Work: None ordered If you have labs (blood work) drawn today and your tests are completely normal, you will receive your results only by: MyChart Message (if you have MyChart) OR A paper copy in the mail If you have any lab test that is abnormal or we need to change your treatment, we will call you to review the results.   Testing/Procedures: None ordered   Follow-Up: At [facility], you and your health needs are our priority.  As part of our continuing mission to provide you with exceptional heart care, we have created designated Provider Care Teams.  These Care Teams include your primary Cardiologist (physician) and Advanced Practice Providers (APPs -  Physician Assistants and Nurse Practitioners) who all work together to provide you with the care you need, when you need it.  We recommend signing up for the patient portal called "MyChart".  Sign up information is provided on this After Visit Summary.  MyChart is used to connect with patients for Virtual Visits (Telemedicine).  Patients are able to view lab/test results, encounter notes, upcoming appointments, etc.  Non-urgent messages can be sent to your provider as well.   To learn more about what you can do with MyChart, go to [email or web address].    Your next appointment:   6 month(s)  Provider:   Dr. Kirke Corin

## 2022-12-07 NOTE — Progress Notes (Signed)
Remote ICD transmission.   

## 2022-12-20 NOTE — Progress Notes (Signed)
This encounter was created in error - please disregard. Called x3 with no answer, I left a detailed message for patient to call I=office to reschedule visit.

## 2022-12-28 ENCOUNTER — Encounter (HOSPITAL_COMMUNITY): Payer: Self-pay

## 2022-12-28 NOTE — H&P (Signed)
   Cardiology Admission History and Physical   Patient ID: SONOMA THISSEN MRN: [id number]; DOB: September 24, 1965    As of 5:45am, still awaiting transfer to [facility]. Below is a brief chart review and preliminary assessment and plan that is subject to change once patient arrives and is examined.   Chief Complaint:  unstable angina  Patient Profile:   Lindsay Camacho is a 57 y.o. female with unstable angina who is being seen 12/29/2022 for the evaluation of coronary obstruction explaining symptoms.  History of Present Illness:   Lindsay Camacho has prior CAD s/p LCx PCI in 2015 for NSTEMI but c/b retroperitoneal bleeding requiring surgical intervention, PAD with L subclavian arter stenting in 2023, mitral valve repair for severe MR in 2018, prior stroke in 2023 and is s/p ICD implantation.  Last echo with preserved EF.   Presented to [facility] ED with chest pain in L chest radiating to the L arm, reminiscent of her prior symptoms associated with CAD.  ECG there shows paced rhythm, troponins were 17 and 20, proBNP was 2898, Cr 0.9. Chest CT ruled out PE. She was treated with ASA, nitropaste and morphine. Started on heparin. Also given a single dose of IV lasix 40 and Kdur 40 to replete K of 3.4  Last LDL in 08/2022 was 82  Reports adherence to home meds which include ASA, clopidogrel, atorva 80, carvedilol 6.25 bid, losartan 25, and spiro 12.5 Still actively smoking   Assessment and Plan:   Unstable angina with symptoms similar to prior cardiac events. No troponin elevation at outside ED, although BNP was elevated. Paced rhythm  Would benefit from repeat cath given symptoms. Consider L and R heart cath to help elucidate TV hemodynamics.  Recommended overnight heparinization - started by ED Continue ASA and clopidogrel Already well optimized with BB, ARB, and spironolactone Would definitely benefit from smoking cessation counseling  LDL target could be set lower due to vasculopathic history.  Did not tolerate inclisiran before with atypical symptoms.  Consider PCSK9i or at least add ezetimibe to high intensity statin.   Additional Data:  EKG:  The ECG that was done at OSH was personally reviewed and demonstrates paced rhythm  Relevant CV Studies: Last echo in 03/2022 shows preserved EF, mitral valvular ring without substantial MR, but she still has severe TR  Laboratory Data: Described above   Radiology/Studies:  PE CT negative for PE, shows cardiomegaly   Signed, Eyvonne Left, MD  12/29/2022 5:50 AM

## 2022-12-29 ENCOUNTER — Inpatient Hospital Stay (HOSPITAL_COMMUNITY)
Admission: RE | Admit: 2022-12-29 | Discharge: 2022-12-31 | DRG: 315 | Disposition: A | Discharge status: Home / Self Care | Payer: Medicare HMO | Attending: Cardiology | Admitting: Cardiology

## 2022-12-29 DIAGNOSIS — E785 Hyperlipidemia, unspecified: Secondary | ICD-10-CM | POA: Diagnosis present

## 2022-12-29 DIAGNOSIS — Z95810 Presence of automatic (implantable) cardiac defibrillator: Secondary | ICD-10-CM | POA: Diagnosis not present

## 2022-12-29 DIAGNOSIS — Z720 Tobacco use: Secondary | ICD-10-CM | POA: Diagnosis present

## 2022-12-29 DIAGNOSIS — I252 Old myocardial infarction: Secondary | ICD-10-CM | POA: Diagnosis not present

## 2022-12-29 DIAGNOSIS — Z8249 Family history of ischemic heart disease and other diseases of the circulatory system: Secondary | ICD-10-CM

## 2022-12-29 DIAGNOSIS — Z79899 Other long term (current) drug therapy: Secondary | ICD-10-CM

## 2022-12-29 DIAGNOSIS — I447 Left bundle-branch block, unspecified: Secondary | ICD-10-CM | POA: Diagnosis present

## 2022-12-29 DIAGNOSIS — Z8052 Family history of malignant neoplasm of bladder: Secondary | ICD-10-CM

## 2022-12-29 DIAGNOSIS — Z8673 Personal history of transient ischemic attack (TIA), and cerebral infarction without residual deficits: Secondary | ICD-10-CM

## 2022-12-29 DIAGNOSIS — I5022 Chronic systolic (congestive) heart failure: Secondary | ICD-10-CM | POA: Diagnosis present

## 2022-12-29 DIAGNOSIS — Z880 Allergy status to penicillin: Secondary | ICD-10-CM | POA: Diagnosis not present

## 2022-12-29 DIAGNOSIS — I428 Other cardiomyopathies: Secondary | ICD-10-CM | POA: Diagnosis present

## 2022-12-29 DIAGNOSIS — I110 Hypertensive heart disease with heart failure: Secondary | ICD-10-CM | POA: Diagnosis present

## 2022-12-29 DIAGNOSIS — I2510 Atherosclerotic heart disease of native coronary artery without angina pectoris: Secondary | ICD-10-CM | POA: Diagnosis present

## 2022-12-29 DIAGNOSIS — I2489 Other forms of acute ischemic heart disease: Secondary | ICD-10-CM | POA: Diagnosis present

## 2022-12-29 DIAGNOSIS — I272 Pulmonary hypertension, unspecified: Secondary | ICD-10-CM | POA: Diagnosis present

## 2022-12-29 DIAGNOSIS — Z888 Allergy status to other drugs, medicaments and biological substances status: Secondary | ICD-10-CM

## 2022-12-29 DIAGNOSIS — Z955 Presence of coronary angioplasty implant and graft: Secondary | ICD-10-CM

## 2022-12-29 DIAGNOSIS — I071 Rheumatic tricuspid insufficiency: Secondary | ICD-10-CM | POA: Diagnosis present

## 2022-12-29 DIAGNOSIS — F17210 Nicotine dependence, cigarettes, uncomplicated: Secondary | ICD-10-CM | POA: Diagnosis present

## 2022-12-29 DIAGNOSIS — Z823 Family history of stroke: Secondary | ICD-10-CM

## 2022-12-29 DIAGNOSIS — Z7982 Long term (current) use of aspirin: Secondary | ICD-10-CM | POA: Diagnosis not present

## 2022-12-29 DIAGNOSIS — Z7902 Long term (current) use of antithrombotics/antiplatelets: Secondary | ICD-10-CM | POA: Diagnosis not present

## 2022-12-29 DIAGNOSIS — I10 Essential (primary) hypertension: Secondary | ICD-10-CM | POA: Diagnosis present

## 2022-12-29 DIAGNOSIS — Z801 Family history of malignant neoplasm of trachea, bronchus and lung: Secondary | ICD-10-CM

## 2022-12-29 DIAGNOSIS — I200 Unstable angina: Secondary | ICD-10-CM | POA: Diagnosis present

## 2022-12-29 DIAGNOSIS — Z952 Presence of prosthetic heart valve: Secondary | ICD-10-CM

## 2022-12-29 DIAGNOSIS — Z8616 Personal history of COVID-19: Secondary | ICD-10-CM

## 2022-12-29 DIAGNOSIS — R079 Chest pain, unspecified: Secondary | ICD-10-CM | POA: Diagnosis present

## 2022-12-29 DIAGNOSIS — I309 Acute pericarditis, unspecified: Secondary | ICD-10-CM | POA: Diagnosis present

## 2022-12-29 LAB — CBC
HCT: 44.2 % (ref 36.0–46.0)
Hemoglobin: 14.3 g/dL (ref 12.0–15.0)
MCH: 28.5 pg (ref 26.0–34.0)
MCHC: 32.4 g/dL (ref 30.0–36.0)
MCV: 88.2 fL (ref 80.0–100.0)
Platelets: 172 10*3/uL (ref 150–400)
RBC: 5.01 MIL/uL (ref 3.87–5.11)
RDW: 14 % (ref 11.5–15.5)
WBC: 14 10*3/uL — ABNORMAL HIGH (ref 4.0–10.5)
nRBC: 0 % (ref 0.0–0.2)

## 2022-12-29 LAB — TROPONIN I (HIGH SENSITIVITY)
Troponin I (High Sensitivity): 11 ng/L (ref <18)
Troponin I (High Sensitivity): 10 ng/L (ref <18)

## 2022-12-29 LAB — LIPID PANEL
Cholesterol: 172 mg/dL (ref 0–200)
HDL: 53 mg/dL (ref >40)
LDL Cholesterol: 108 mg/dL — ABNORMAL HIGH (ref 0–99)
Total CHOL/HDL Ratio: 3.2 {ratio}
Triglycerides: 55 mg/dL (ref <150)
VLDL: 11 mg/dL (ref 0–40)

## 2022-12-29 LAB — CREATININE, SERUM
Creatinine, Ser: 0.87 mg/dL (ref 0.44–1.00)
GFR, Estimated: >60 mL/min (ref >60)

## 2022-12-29 LAB — HEPARIN LEVEL (UNFRACTIONATED): Heparin Unfractionated: 0.64 [IU]/mL (ref 0.30–0.70)

## 2022-12-29 MED ORDER — SODIUM CHLORIDE 0.9% FLUSH
3.0000 mL | Freq: Two times a day (BID) | INTRAVENOUS | Status: DC
  Administered 2022-12-29 – 2022-12-31 (×3): 3 mL via INTRAVENOUS

## 2022-12-29 MED ORDER — LOSARTAN POTASSIUM 50 MG PO TABS
50.0000 mg | ORAL_TABLET | Freq: Every day | ORAL | Status: DC
  Administered 2022-12-30 – 2022-12-31 (×2): 50 mg via ORAL
  Filled 2022-12-29 (×2): qty 1

## 2022-12-29 MED ORDER — SODIUM CHLORIDE 0.9 % IV SOLN: 250.0000 mL | INTRAVENOUS | Status: AC | PRN

## 2022-12-29 MED ORDER — PANTOPRAZOLE SODIUM 40 MG PO TBEC
40.0000 mg | DELAYED_RELEASE_TABLET | Freq: Every day | ORAL | Status: DC
  Administered 2022-12-30 – 2022-12-31 (×2): 40 mg via ORAL
  Filled 2022-12-29 (×2): qty 1

## 2022-12-29 MED ORDER — ONDANSETRON HCL 4 MG/2ML IJ SOLN
4.0000 mg | Freq: Four times a day (QID) | INTRAMUSCULAR | Status: DC | PRN
  Administered 2022-12-29: 4 mg via INTRAVENOUS
  Filled 2022-12-29: qty 2

## 2022-12-29 MED ORDER — ACETAMINOPHEN 325 MG PO TABS
650.0000 mg | ORAL_TABLET | ORAL | Status: DC | PRN
Start: 2022-12-29 — End: 2022-12-31
  Administered 2022-12-29 – 2022-12-31 (×3): 650 mg via ORAL
  Filled 2022-12-29 (×3): qty 2

## 2022-12-29 MED ORDER — CLOPIDOGREL BISULFATE 75 MG PO TABS
75.0000 mg | ORAL_TABLET | Freq: Every day | ORAL | Status: DC
  Administered 2022-12-30 – 2022-12-31 (×2): 75 mg via ORAL
  Filled 2022-12-29 (×2): qty 1

## 2022-12-29 MED ORDER — SPIRONOLACTONE 12.5 MG HALF TABLET
12.5000 mg | ORAL_TABLET | Freq: Every day | ORAL | Status: DC
  Administered 2022-12-30 – 2022-12-31 (×2): 12.5 mg via ORAL
  Filled 2022-12-29 (×2): qty 1

## 2022-12-29 MED ORDER — CARVEDILOL 6.25 MG PO TABS
6.2500 mg | ORAL_TABLET | Freq: Two times a day (BID) | ORAL | Status: DC
  Administered 2022-12-29 – 2022-12-31 (×4): 6.25 mg via ORAL
  Filled 2022-12-29 (×4): qty 1

## 2022-12-29 MED ORDER — HEPARIN (PORCINE) 25000 UT/250ML-% IV SOLN
750.0000 [IU]/h | INTRAVENOUS | Status: DC
Start: 2022-12-29 — End: 2022-12-31
  Administered 2022-12-29: 700 [IU]/h via INTRAVENOUS
  Administered 2022-12-31: 650 [IU]/h via INTRAVENOUS
  Filled 2022-12-29: qty 250

## 2022-12-29 MED ORDER — ASPIRIN 81 MG PO TBEC
81.0000 mg | DELAYED_RELEASE_TABLET | Freq: Every day | ORAL | Status: DC
  Administered 2022-12-30 – 2022-12-31 (×2): 81 mg via ORAL
  Filled 2022-12-29 (×2): qty 1

## 2022-12-29 MED ORDER — ROSUVASTATIN CALCIUM 20 MG PO TABS
40.0000 mg | ORAL_TABLET | Freq: Every day | ORAL | Status: DC
  Administered 2022-12-30 – 2022-12-31 (×2): 40 mg via ORAL
  Filled 2022-12-29 (×2): qty 2

## 2022-12-29 MED ORDER — CYCLOBENZAPRINE HCL 10 MG PO TABS
10.0000 mg | ORAL_TABLET | Freq: Three times a day (TID) | ORAL | Status: DC | PRN
  Administered 2022-12-29 – 2022-12-31 (×3): 10 mg via ORAL
  Filled 2022-12-29 (×3): qty 1

## 2022-12-29 MED ORDER — SODIUM CHLORIDE 0.9% FLUSH: 3.0000 mL | INTRAVENOUS | Status: DC | PRN

## 2022-12-29 MED ORDER — HEPARIN BOLUS VIA INFUSION
3000.0000 [IU] | Freq: Once | INTRAVENOUS | Status: DC
  Filled 2022-12-29: qty 3000

## 2022-12-29 MED ORDER — ATORVASTATIN CALCIUM 80 MG PO TABS: 80.0000 mg | ORAL_TABLET | Freq: Every day | ORAL | Status: DC

## 2022-12-29 NOTE — Progress Notes (Signed)
PHARMACY - ANTICOAGULATION CONSULT NOTE  Pharmacy Consult for heparin Indication: chest pain/ACS  Allergies  Allergen Reactions   Aldomet [Methyldopa] Other (See Comments)    Severe hypotension   Brilinta [Ticagrelor] Shortness Of Breath   Other Other (See Comments)    Nuclear Stress Test Medication caused seizures   Coumadin [Warfarin Sodium] Swelling    Arm swelling   Desyrel [Trazodone] Other (See Comments)    Could not wake up from medication, comatosed   Eliquis [Apixaban] Other (See Comments)    BP got too low   Entresto [Sacubitril-Valsartan] Hypertension    Hypotension    Zestril [Lisinopril]     Significant blood pressure fluctuations    Penicillins Rash    Has patient had a PCN reaction causing immediate rash, facial/tongue/throat swelling, SOB or lightheadedness with hypotension: Yes Has patient had a PCN reaction causing severe rash involving mucus membranes or skin necrosis: No Has patient had a PCN reaction that required hospitalization: No Has patient had a PCN reaction occurring within the last 10 years: No If all of the above answers are "NO", then may proceed with Cephalosporin use.     Patient Measurements: Weight: 47.8 kg (105 lb 6.1 oz) Height 5'1" Heparin Dosing Weight:  48 kg  Vital Signs: BP: 165/78 (12/27 1832) Pulse Rate: 78 (12/27 1832)  Labs: Recent Labs    12/29/22 1852  HEPARINUNFRC 0.64    CrCl cannot be calculated (Patient's most recent lab result is older than the maximum 21 days allowed.).   Medical History: Past Medical History:  Diagnosis Date   Acute myocardial infarction of other lateral wall, initial episode of care 01/2013   DES CFX   Aortoiliac occlusive disease (HCC) 08/22/2016   CAD (coronary artery disease) 11/13/2011   50% ? proximal LCx stenosis per Cath at [facility]   Chest pain, atypical, muscular skelatal   01/09/2014   COVID 02/08/2020   Dyspnea    Heart failure (HCC)    HLD (hyperlipidemia)    Hypertension     Intermediate coronary syndrome (HCC)    LBBB (left bundle branch block)    Mitral regurgitation    Nonischemic cardiomyopathy (HCC) 01/26/2017   Presence of combination internal cardiac defibrillator (ICD) and pacemaker    PVD (peripheral vascular disease) (HCC)    Right CFA stenosis per report on 11/14/2011   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/25/2016   Edwards McArthy-Adams IMR ETLogix ring annuloplasty (Model 4100, Serial [id number], size 26) placed via right mini thoracotomy approach   Stenosis of left subclavian artery (HCC) 08/22/2016   Stroke (HCC)    tia with no deficits      Assessment: 57 yo W from [place] ED with unstable angina symptoms similar to prior cardiac events. No troponin elevation at outside ED, although BNP was elevated. No anticoagulation prior to admission. Pharmacy consulted for heparin.    Patient was started on heparin at [facility] ED, heparin running at 690 units/hr. Stat heparin level 0.64 is at high end of therapeutic on 690 units/hr. Level was drawn upstream from heparin on left arm. Heparin has been moved to right arm now.  No issues with infusion or bleeding per RN.   Goal of Therapy:  Heparin level 0.3-0.7 units/ml Monitor platelets by anticoagulation protocol: Yes   Plan Decrease Heparin to 650 units /hr Monitor daily heparin level, CBC, signs/symptoms of bleeding  F/u Cath plans   Alphia Moh, PharmD, BCPS, [facility] Clinical Pharmacist  Please check AMION for all [facility] Pharmacy phone  numbers After 10:00 PM, call Main Pharmacy [phone]

## 2022-12-29 NOTE — Progress Notes (Signed)
Received patient from transport. Admissions paged. Patient vitals stable. Pt requesting  although sating 98 on room air. 2LNC applied. Heparin running at 6.9 ml/hr and Nitroglycerin running at 39 ml/hr. Waiting on orders and will continue admission.

## 2022-12-29 NOTE — H&P (Addendum)
Cardiology Admission History and Physical   Patient ID: ANJALEE NETTO MRN: [id number]; DOB: 1965/06/23   Admission date: 12/29/2022  PCP:  Marrianne Mood, MD   [facility] Providers Cardiologist:  None  Electrophysiologist:  Lewayne Bunting, MD  PV Cardiologist:  Lorine Bears, MD       Chief Complaint:  Chest pain  Patient Profile:   GERMANY TILFORD is a 57 y.o. female with left subclavian artery stenting 9/23, CAD s/p PCI of LCx 2015, chronic systolic heart failure s/p Medtronic CRT-D with improved EF, severe MR s/p repair 2018, HLD, tobacco abuse and LBBB who is being seen 12/29/2022 for the evaluation of chest pain.  History of Present Illness:   Ms. Blanken is a 57 year old female with past medical history of left subclavian artery stenting 9/23, CAD s/p PCI of LCx 2015, chronic systolic heart failure s/p Medtronic CRT-D with improved EF, severe MR s/p repair 2018, HLD, tobacco abuse and LBBB.  Patient was previously followed by Dr. Eldridge Dace.  She had a lateral MI in 2015 treated with PCI of left circumflex artery.  Procedure was complicated by retroperitoneal bleed requiring vascular surgery.  Repeat left and right heart cath prior to valve surgery in August 2018 showed patent left circumflex stent with minimal restenosis and mild nonobstructive CAD in RCA and LAD.  She underwent minimally invasive mitral valve repair by Dr. Cornelius Moras for severe symptomatic MR on 08/25/2016.  She underwent Medtronic CRT-D implantation in January 2019 for EF of 20 to 25%.  She was admitted at [facility] in January 2021 for worsening shortness of breath due to acute on chronic systolic heart failure.  She underwent IV diuresis.  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  She underwent successful angioplasty and the balloon expandable stent placement to the left circumflex artery.  She was admitted in March  2024 with stroke that was thought to be due to small vessel disease.  MRI showed 8 mm acute infarct of left corona radiata.  CTA showed widely patent left subclavian stent with nonobstructive disease in the carotid arteries.  Echocardiogram showed EF 55 to 60% with mild pulmonary hypertension and normal functioning mitral valve prosthesis, mild to moderate aortic stenosis.  Home medication include aspirin, Lipitor, carvedilol, Plavix, as needed Lasix, spironolactone and losartan.  According to the patient, she has not required any Lasix recently.  She presented to [facility] on 12/28/2022 after developed chest pain at rest.  Blood pressure on arrival was 177/82.  She has substernal chest discomfort and also chest pain under the left armpit.  She has shortness of breath and headache but no fever or cough.  Chest discomfort worsens with deep inspiration.  She was placed on nitroglycerin drip.  Serial high-sensitivity troponin were borderline elevated but remained flat at 17-->20-->16.  proBNP 2898.  Hemoglobin 15.8.  CTA of the chest obtained on 12/28/2022 showed no filling defect to suggest PE, moderate cardiomegaly with some right heart enlargement and some reflux of contrast into the hepatic vein which may be an indicator for right heart dysfunction, left subclavian artery stent noted, coronary artery disease noted, stable scarring of the right middle lobe and lateral right upper lobe.  Patient given one dose of 40mg  IV lasix and one dose of 40 meq KCl. The patient was subsequently transferred to [facility] for further evaluation.  EKG showed atrial sensed ventricular paced rhythm.  It was difficult to  obtain history from the patient as she has discomfort everywhere.  Past Medical History:  Diagnosis Date   Acute myocardial infarction of other lateral wall, initial episode of care 01/2013   DES CFX   Aortoiliac occlusive disease (HCC) 08/22/2016   CAD (coronary artery disease) 11/13/2011    50% ? proximal LCx stenosis per Cath at [facility]   Chest pain, atypical, muscular skelatal   01/09/2014   COVID 02/08/2020   Dyspnea    Heart failure (HCC)    HLD (hyperlipidemia)    Hypertension    Intermediate coronary syndrome (HCC)    LBBB (left bundle branch block)    Mitral regurgitation    Nonischemic cardiomyopathy (HCC) 01/26/2017   Presence of combination internal cardiac defibrillator (ICD) and pacemaker    PVD (peripheral vascular disease) (HCC)    Right CFA stenosis per report on 11/14/2011   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/25/2016   Edwards McArthy-Adams IMR ETLogix ring annuloplasty (Model 4100, Serial [id number], size 26) placed via right mini thoracotomy approach   Stenosis of left subclavian artery (HCC) 08/22/2016   Stroke (HCC)    tia with no deficits    Past Surgical History:  Procedure Laterality Date   AORTIC ARCH ANGIOGRAPHY N/A 09/21/2021   Procedure: AORTIC ARCH ANGIOGRAPHY;  Surgeon: Iran Ouch, MD;  Location: [facility] INVASIVE CV LAB;  Service: Cardiovascular;  Laterality: N/A;   APPENDECTOMY     BIV ICD INSERTION CRT-D N/A 01/24/2017   Procedure: BIV ICD INSERTION CRT-D;  Surgeon: Marinus Maw, MD;  Location: [facility] INVASIVE CV LAB;  Service: Cardiovascular;  Laterality: N/A;   CARDIAC CATHETERIZATION  04/14/2013   Non-obstructive disease, patent CFX stent   CARDIAC CATHETERIZATION N/A 09/21/2014   Procedure: Left Heart Cath and Coronary Angiography;  Surgeon: Lennette Bihari, MD;  Location: [facility] INVASIVE CV LAB;  Service: Cardiovascular;  Laterality: N/A;   CORONARY ANGIOPLASTY WITH STENT PLACEMENT  01/17/2013   mild disease except 99% CFX, rx with  2.5 x 28 Alpine drug-eluting stent    GROIN DEBRIDEMENT Right 04/14/2013   Procedure: Emergency Evacuation of Retroperitoneal Hematoma and Repair Right External Iliac Artery Pseudoaneurysm    ;  Surgeon: Sherren Kerns, MD;  Location: [facility] OR;  Service: Vascular;  Laterality: Right;   LEFT HEART  CATHETERIZATION WITH CORONARY ANGIOGRAM N/A 01/18/2013   Procedure: LEFT HEART CATHETERIZATION WITH CORONARY ANGIOGRAM;  Surgeon: Corky Crafts, MD;  Location: [facility] CATH LAB;  Service: Cardiovascular;  Laterality: N/A;   LEFT HEART CATHETERIZATION WITH CORONARY ANGIOGRAM N/A 04/14/2013   Procedure: LEFT HEART CATHETERIZATION WITH CORONARY ANGIOGRAM;  Surgeon: Corky Crafts, MD;  Location: [facility] CATH LAB;  Service: Cardiovascular;  Laterality: N/A;   MITRAL VALVE REPAIR Right 08/25/2016   Procedure: MINIMALLY INVASIVE MITRAL VALVE REPAIR;  Surgeon: Purcell Nails, MD;  Location: [facility] OR;  Service: Open Heart Surgery;  Laterality: Right;   MULTIPLE EXTRACTIONS WITH ALVEOLOPLASTY N/A 08/23/2016   Procedure: Extraction of tooth #'s 17, 20-23, 26-29 with alveoloplasty;  Surgeon: [name], DDS;  Location: [facility] OR;  Service: Oral Surgery;  Laterality: N/A;   PERCUTANEOUS CORONARY STENT INTERVENTION (PCI-S)  01/18/2013   Procedure: PERCUTANEOUS CORONARY STENT INTERVENTION (PCI-S);  Surgeon: Corky Crafts, MD;  Location[facility] CATH LAB;  Service: Cardiovascular;;   RIGHT/LEFT HEART CATH AND CORONARY ANGIOGRAPHY N/A 08/21/2016   Procedure: RIGHT/LEFT HEART CATH AND CORONARY ANGIOGRAPHY;  Surgeon: Kathleene Hazel, MD;  Location: [facility] INVASIVE CV LAB;  Service: Cardiovascular;  Laterality:  N/A;   TEE WITHOUT CARDIOVERSION N/A 03/06/2014   Procedure: TRANSESOPHAGEAL ECHOCARDIOGRAM (TEE);  Surgeon: Lars Masson, MD;  Location: [facility] ENDOSCOPY;  Service: Cardiovascular;  Laterality: N/A;   TEE WITHOUT CARDIOVERSION N/A 08/18/2016   Procedure: TRANSESOPHAGEAL ECHOCARDIOGRAM (TEE);  Surgeon: Lewayne Bunting, MD;  Location: [facility] ENDOSCOPY;  Service: Cardiovascular;  Laterality: N/A;   TEE WITHOUT CARDIOVERSION N/A 08/25/2016   Procedure: TRANSESOPHAGEAL ECHOCARDIOGRAM (TEE);  Surgeon: Purcell Nails, MD;  Location: [facility] OR;  Service: Open Heart Surgery;  Laterality: N/A;   TUBAL LIGATION     UPPER  EXTREMITY INTERVENTION  09/21/2021   Procedure: UPPER EXTREMITY INTERVENTION;  Surgeon: Iran Ouch, MD;  Location: [facility] INVASIVE CV LAB;  Service: Cardiovascular;;     Medications Prior to Admission: Prior to Admission medications   Medication Sig Start Date End Date Taking? Authorizing Provider  aspirin EC 81 MG tablet Take 81 mg by mouth daily.    [provider]  atorvastatin (LIPITOR) 80 MG tablet Take 1 tablet (80 mg total) by mouth daily. 09/07/20   Marinus Maw, MD  carvedilol (COREG) 6.25 MG tablet Take 1 tablet (6.25 mg total) by mouth 2 (two) times daily. 10/20/21   Corky Crafts, MD  clopidogrel (PLAVIX) 75 MG tablet Take 1 tablet (75 mg total) by mouth daily. 09/10/20   Marinus Maw, MD  cyclobenzaprine (FLEXERIL) 10 MG tablet Take 1 tablet (10 mg total) by mouth 3 (three) times daily as needed for muscle spasms. 05/19/22   Roxy Horseman, PA-C  ergocalciferol (VITAMIN D2) 1.25 MG (50000 UT) capsule Take 1 capsule (50,000 Units total) by mouth every Friday. Patient not taking: Reported on 11/14/2022 05/26/22   Iran Ouch, MD  furosemide (LASIX) 40 MG tablet Take 1 tablet (40 mg total) by mouth daily as needed for fluid or edema (shortness of breath). 03/05/20   Graciella Freer, PA-C  ibuprofen (ADVIL) 600 MG tablet Take 1 tablet (600 mg total) by mouth every 6 (six) hours as needed. 05/19/22   Roxy Horseman, PA-C  losartan (COZAAR) 50 MG tablet Take 1 tablet (50 mg total) by mouth daily. 11/14/22   Iran Ouch, MD  Menthol-Methyl Salicylate (SALONPAS PAIN RELIEF PATCH EX) Apply 1 patch topically daily as needed (pain).    [provider]  nicotine (NICODERM CQ - DOSED IN MG/24 HOURS) 14 mg/24hr patch Apply daily for 6 weeks, then decrease to 7mg  daily for 2 weeks Patient not taking: Reported on 11/14/2022 11/07/21   Corky Crafts, MD  oxyCODONE-acetaminophen (PERCOCET/ROXICET) 5-325 MG tablet Take 1 tablet by mouth every 6  (six) hours as needed for up to 10 doses for severe pain. Patient not taking: Reported on 11/14/2022 03/05/22   Mardene Sayer, MD  pantoprazole (PROTONIX) 40 MG tablet Take 1 tablet (40 mg total) by mouth daily. 02/07/22   Iran Ouch, MD  spironolactone (ALDACTONE) 25 MG tablet Take 0.5 tablets (12.5 mg total) by mouth daily. 02/07/22   Iran Ouch, MD     Allergies:    Allergies  Allergen Reactions   Aldomet [Methyldopa] Other (See Comments)    Severe hypotension   Brilinta [Ticagrelor] Shortness Of Breath   Other Other (See Comments)    Nuclear Stress Test Medication caused seizures   Coumadin [Warfarin Sodium] Swelling    Arm swelling   Desyrel [Trazodone] Other (See Comments)    Could not wake up from medication, comatosed   Eliquis [Apixaban] Other (See Comments)  BP got too low   Entresto [Sacubitril-Valsartan] Hypertension    Hypotension    Zestril [Lisinopril]     Significant blood pressure fluctuations    Penicillins Rash    Has patient had a PCN reaction causing immediate rash, facial/tongue/throat swelling, SOB or lightheadedness with hypotension: Yes Has patient had a PCN reaction causing severe rash involving mucus membranes or skin necrosis: No Has patient had a PCN reaction that required hospitalization: No Has patient had a PCN reaction occurring within the last 10 years: No If all of the above answers are "NO", then may proceed with Cephalosporin use.     Social History:   Social History   Socioeconomic History   Marital status: Single    Spouse name: Not on file   Number of children: Not on file   Years of education: Not on file   Highest education level: Not on file  Occupational History   Not on file  Tobacco Use   Smoking status: Every Day    Current packs/day: 0.50    Types: Cigarettes   Smokeless tobacco: Never  Vaping Use   Vaping status: Never Used  Substance and Sexual Activity   Alcohol use: No    Alcohol/week: 0.0  standard drinks of alcohol   Drug use: Not Currently    Types: Marijuana   Sexual activity: Yes    Birth control/protection: None  Other Topics Concern   Not on file  Social History Narrative   Not on file   Social Drivers of Health   Financial Resource Strain: Not on file  Food Insecurity: No Food Insecurity (03/14/2022)   Hunger Vital Sign    Worried About Running Out of Food in the Last Year: Never true    Ran Out of Food in the Last Year: Never true  Transportation Needs: No Transportation Needs (03/14/2022)   PRAPARE - Administrator, Civil Service (Medical): No    Lack of Transportation (Non-Medical): No  Physical Activity: Not on file  Stress: Not on file  Social Connections: Not on file  Intimate Partner Violence: Not At Risk (03/14/2022)   Humiliation, Afraid, Rape, and Kick questionnaire    Fear of Current or Ex-Partner: No    Emotionally Abused: No    Physically Abused: No    Sexually Abused: No    Family History:   The patient's family history includes Bladder Cancer in her mother; CAD (age of onset: 42) in her mother; CAD (age of onset: 11) in her father; Heart attack in her father and mother; Heart disease in her father and mother; Hypertension in her mother; Lung cancer in her mother; Stroke in her father and mother.    ROS:  Please see the history of present illness.  All other ROS reviewed and negative.     Physical Exam/Data:   Vitals:   12/29/22 1832  BP: (!) 165/78  Pulse: 78  SpO2: 98%  Weight: 47.8 kg   No intake or output data in the 24 hours ending 12/29/22 1927    12/29/2022    6:32 PM 12/20/2022    9:47 AM 11/14/2022   10:46 AM  Last 3 Weights  Weight (lbs) 105 lb 6.1 oz -- 109 lb  Weight (kg) 47.8 kg -- 49.442 kg     Body mass index is 19.91 kg/m.  General:  Well nourished, well developed, in no acute distress HEENT: normal Neck: no JVD Vascular: No carotid bruits; Distal pulses 2+ bilaterally   Cardiac:  normal S1, S2;  RRR; no murmur  Lungs:  clear to auscultation bilaterally, no wheezing, rhonchi or rales  Abd: soft, nontender, no hepatomegaly  Ext: no edema Musculoskeletal:  No deformities, BUE and BLE strength normal and equal Skin: warm and dry  Neuro:  CNs 2-12 intact, no focal abnormalities noted Psych:  Normal affect    EKG:  The ECG that was done and was personally reviewed and demonstrates atrial paced ventricularly paced rhythm  Relevant CV Studies:  Cath 08/21/2016 Ost RCA to Prox RCA lesion, 10 %stenosed. Dist RCA lesion, 30 %stenosed. Prox Cx to Mid Cx lesion, 10 %stenosed. Prox LAD to Mid LAD lesion, 30 %stenosed.   1. Patent stent in the Circumflex artery with minimal restenosis 2. Mild non-obstructive disease in the RCA and LAD (of note, there was spasm in the RCA with catheter engagement that resolved with SL NTG)   Recommendations: Medical management of CAD. Continue planning for valve repair/replacement.    Echo 03/16/2022  1. Left ventricular ejection fraction, by estimation, is 55 to 60%. The  left ventricle has normal function. The left ventricle has no regional  wall motion abnormalities. There is mild concentric left ventricular  hypertrophy. Left ventricular diastolic  function could not be evaluated.   2. Right ventricular systolic function is normal. The right ventricular  size is normal. There is mildly elevated pulmonary artery systolic  pressure. The estimated right ventricular systolic pressure is 38.5 mmHg.   3. Left atrial size was mildly dilated.   4. The mitral valve is myxomatous. Trivial mitral valve regurgitation. No  evidence of mitral stenosis. The mean mitral valve gradient is 3.0 mmHg  with average heart rate of 62 bpm. There is a prosthetic annuloplasty ring  present in the mitral position.  Procedure Date: 08/25/16. Echo findings are consistent with normal  structure and function of the mitral valve prosthesis.   5. The tricuspid valve is abnormal.  Tricuspid valve regurgitation is  severe.   6. The aortic valve is tricuspid. Aortic valve regurgitation is mild to  moderate. No aortic stenosis is present.    Laboratory Data:  High Sensitivity Troponin:  No results for input(s): "TROPONINIHS" in the last 720 hours.    ChemistryNo results for input(s): "NA", "K", "CL", "CO2", "GLUCOSE", "BUN", "CREATININE", "CALCIUM", "MG", "GFRNONAA", "GFRAA", "ANIONGAP" in the last 168 hours.  No results for input(s): "PROT", "ALBUMIN", "AST", "ALT", "ALKPHOS", "BILITOT" in the last 168 hours. Lipids No results for input(s): "CHOL", "TRIG", "HDL", "LABVLDL", "LDLCALC", "CHOLHDL" in the last 168 hours. HematologyNo results for input(s): "WBC", "RBC", "HGB", "HCT", "MCV", "MCH", "MCHC", "RDW", "PLT" in the last 168 hours. Thyroid No results for input(s): "TSH", "FREET4" in the last 168 hours. BNPNo results for input(s): "BNP", "PROBNP" in the last 168 hours.  DDimer No results for input(s): "DDIMER" in the last 168 hours.   Radiology/Studies:  No results found.   Assessment and Plan:   Chest pain: Somewhat atypical, started on 12/26, chest pain worsens with deep inspiration.  High-sensitivity serial troponin 17-->20-->16.  proBNP elevated 2900.  Although patient had a history of chronic systolic heart failure, EF improved to 50 to 55% by August 2023.  Most recent echocardiogram in March 2023 showed EF 55 to 60%.  Will discuss with MD, obtain echocardiogram as initial assessment.  If EF drops, may consider cardiac catheterization.  Headache: It is unclear to me if the patient's headache started before hospitalization and IV nitroglycerin or afterward.  She is a very poor historian.  Given atypical characteristic of the chest pain, consider stop IV nitroglycerin for now.  CAD: History of left circumflex stent in 2015.  Last cardiac catheterization in August 2018 demonstrated 10% ostial to proximal RCA lesion, 30% distal RCA lesion, 10% proximal to mid  left circumflex lesion, 30% proximal to mid LAD lesion, patent stent in the left circumflex artery.  Chronic systolic heart failure s/p Medtronic CRT-D with improved EF  Severe MR s/p repair 2018  Hypertension: Blood pressure on arrival at outside ED was 177/82.  Blood pressure elevated. Titrate BP meds   Hyperlipidemia: Continue Lipitor.  Left subclavian artery stenosis: Status post stenting in 2023    Risk Assessment/Risk Scores:         Code Status: Full Code  Severity of Illness: The appropriate patient status for this patient is INPATIENT. Inpatient status is judged to be reasonable and necessary in order to provide the required intensity of service to ensure the patient's safety. The patient's presenting symptoms, physical exam findings, and initial radiographic and laboratory data in the context of their chronic comorbidities is felt to place them at high risk for further clinical deterioration. Furthermore, it is not anticipated that the patient will be medically stable for discharge from the hospital within 2 midnights of admission.   * I certify that at the point of admission it is my clinical judgment that the patient will require inpatient hospital care spanning beyond 2 midnights from the point of admission due to high intensity of service, high risk for further deterioration and high frequency of surveillance required.*   For questions or updates, please contact [facility] Please consult [email or web address] for contact info under     Signed, Azalee Course, [place]  12/29/2022 7:27 PM   History and all data above reviewed.  Patient examined.  I agree with the findings as above.   The patient presents with chest pain and weakness.  The symptoms are very vague.  She is trying to fall asleep during the interview and covers herself up with blankets.  The pain has non anginal greater than anginal features with radiation from the left chest under the breast, axilla and around to  the back.  She has had nausea.  She has not had new PND or orthopnea.  She does sleep with her head elevated.   The patient exam reveals COR:RRR, no rubs  ,  Lungs: Clear  ,  Abd: Positive bowel sounds, no rebound no guarding, Ext No edema, apical systolic murmur radiating out the outflow tract and 2/6.  No diastolic murmurs.   .  All available labs, radiology testing, previous records reviewed. Agree with documented assessment and plan. Chest pain:  EKG paced.  Enzymes are not diagnostic.  Pain is predominantly non anginal.  I will start with the echo.  If the EF is reduced then cath Monday.  If she continues to have chest pain through the weekend she will need cath.  If pain resolves with treatment of nausea and restarting diet and echo is OK I would suggest out patient perfusion imaging.  Elevated BNP:  Check echo as above.  No overt evidence of volume overload.  I don't see a CXR from [facility] but a CT did not demonstrate edema.  PVD:  Up to date with follow up Dopplers.  Sees Dr. Kirke Corin.  Dyslipidemia:  LDL is not at target.  I do not see an allergy to Crestor.  I don't see that she has been on this before.  I will make the change.  MR:  Check echo as above.  CRT:  Up to date with follow up.  [name] Donneisha Beane  8:24 PM  12/29/2022

## 2022-12-30 ENCOUNTER — Other Ambulatory Visit: Payer: Self-pay

## 2022-12-30 ENCOUNTER — Encounter (HOSPITAL_COMMUNITY): Payer: Self-pay | Admitting: Cardiology

## 2022-12-30 ENCOUNTER — Inpatient Hospital Stay (HOSPITAL_COMMUNITY): Payer: Medicare HMO

## 2022-12-30 DIAGNOSIS — R079 Chest pain, unspecified: Secondary | ICD-10-CM

## 2022-12-30 LAB — BASIC METABOLIC PANEL
Anion gap: 9 (ref 5–15)
BUN: 10 mg/dL (ref 6–20)
CO2: 27 mmol/L (ref 22–32)
Calcium: 9.1 mg/dL (ref 8.9–10.3)
Chloride: 102 mmol/L (ref 98–111)
Creatinine, Ser: 0.94 mg/dL (ref 0.44–1.00)
GFR, Estimated: >60 mL/min (ref >60)
Glucose, Bld: 95 mg/dL (ref 70–99)
Potassium: 3.3 mmol/L — ABNORMAL LOW (ref 3.5–5.1)
Sodium: 138 mmol/L (ref 135–145)

## 2022-12-30 LAB — ECHOCARDIOGRAM COMPLETE
AR max vel: 1.77 cm2
AV Area VTI: 1.85 cm2
AV Area mean vel: 1.8 cm2
AV Mean grad: 5 mm[Hg]
AV Peak grad: 10.2 mm[Hg]
Ao pk vel: 1.6 m/s
Area-P 1/2: 2.76 cm2
Est EF: 55
Height: 61 in
MV VTI: 1.28 cm2
P 1/2 time: 444 ms
S' Lateral: 2.9 cm
Weight: 1694.4 [oz_av]

## 2022-12-30 LAB — C-REACTIVE PROTEIN: CRP: 9.5 mg/dL — ABNORMAL HIGH (ref <1.0)

## 2022-12-30 LAB — CBC
HCT: 42.1 % (ref 36.0–46.0)
Hemoglobin: 14 g/dL (ref 12.0–15.0)
MCH: 28.9 pg (ref 26.0–34.0)
MCHC: 33.3 g/dL (ref 30.0–36.0)
MCV: 86.8 fL (ref 80.0–100.0)
Platelets: 169 10*3/uL (ref 150–400)
RBC: 4.85 MIL/uL (ref 3.87–5.11)
RDW: 14 % (ref 11.5–15.5)
WBC: 10.6 10*3/uL — ABNORMAL HIGH (ref 4.0–10.5)
nRBC: 0 % (ref 0.0–0.2)

## 2022-12-30 LAB — SEDIMENTATION RATE: Sed Rate: 25 mm/h — ABNORMAL HIGH (ref 0–22)

## 2022-12-30 LAB — HEPARIN LEVEL (UNFRACTIONATED): Heparin Unfractionated: 0.47 [IU]/mL (ref 0.30–0.70)

## 2022-12-30 NOTE — Progress Notes (Signed)
Progress Note  Patient Name: Lindsay Camacho Date of Encounter: 12/30/2022  Primary Cardiologist: None   Subjective   Patient seen and examined by her bedside.  Inpatient Medications    Scheduled Meds:  aspirin EC  81 mg Oral Daily   carvedilol  6.25 mg Oral BID   clopidogrel  75 mg Oral Daily   losartan  50 mg Oral Daily   pantoprazole  40 mg Oral Daily   rosuvastatin  40 mg Oral Daily   sodium chloride flush  3 mL Intravenous Q12H   spironolactone  12.5 mg Oral Daily   Continuous Infusions:  sodium chloride     heparin 650 Units/hr (12/30/22 0815)   PRN Meds: sodium chloride, acetaminophen, cyclobenzaprine, ondansetron (ZOFRAN) IV, sodium chloride flush   Vital Signs    Vitals:   12/30/22 0419 12/30/22 0536 12/30/22 0737 12/30/22 0750  BP: (!) 141/82   (!) 149/75  Pulse: 63   71  Resp: 18   17  Temp: 98.4 F (36.9 C)   98.6 F (37 C)  TempSrc: Oral   Oral  SpO2: 97%   98%  Weight:  48 kg    Height:   5\' 1"  (1.549 m)     Intake/Output Summary (Last 24 hours) at 12/30/2022 1041 Last data filed at 12/30/2022 0815 Gross per 24 hour  Intake 87.33 ml  Output --  Net 87.33 ml   Filed Weights   12/29/22 1832 12/30/22 0536  Weight: 47.8 kg 48 kg    Telemetry     - Personally Reviewed  ECG      - Personally Reviewed  Physical Exam    General: Comfortable Head: Atraumatic, normal size  Eyes: PEERLA, EOMI  Neck: Supple, normal JVD Cardiac: Normal S1, S2; RRR; no murmurs, rubs, or gallops Lungs: Clear to auscultation bilaterally Abd: Soft, nontender, no hepatomegaly  Ext: warm, no edema Musculoskeletal: No deformities, BUE and BLE strength normal and equal Skin: Warm and dry, no rashes   Neuro: Alert and oriented to person, place, time, and situation, CNII-XII grossly intact, no focal deficits  Psych: Normal mood and affect   Labs    Chemistry Recent Labs  Lab 12/29/22 2041 12/30/22 0239  NA  --  138  K  --  3.3*  CL  --  102  CO2  --   27  GLUCOSE  --  95  BUN  --  10  CREATININE 0.87 0.94  CALCIUM  --  9.1  GFRNONAA >60 >60  ANIONGAP  --  9     Hematology Recent Labs  Lab 12/29/22 2041 12/30/22 0239  WBC 14.0* 10.6*  RBC 5.01 4.85  HGB 14.3 14.0  HCT 44.2 42.1  MCV 88.2 86.8  MCH 28.5 28.9  MCHC 32.4 33.3  RDW 14.0 14.0  PLT 172 169    Cardiac EnzymesNo results for input(s): "TROPONINI" in the last 168 hours. No results for input(s): "TROPIPOC" in the last 168 hours.   BNPNo results for input(s): "BNP", "PROBNP" in the last 168 hours.   DDimer No results for input(s): "DDIMER" in the last 168 hours.   Radiology    No results found.  Cardiac Studies   TTE 12/28 IMPRESSIONS   1. Left ventricular ejection fraction, by estimation, is 55%. The left  ventricle has normal function. The left ventricle has no regional wall  motion abnormalities. There is mild left ventricular hypertrophy. Left  ventricular diastolic parameters are  indeterminate. The average left ventricular  global longitudinal strain is  -15.7 %. The global longitudinal strain is normal.   2. Catheter in RA/RV . Right ventricular systolic function is normal. The  right ventricular size is normal.   3. Left atrial size was mildly dilated.   4. Post annuloplasty ring repair with trivial residual MR mean diastolic   Patient Profile     57 y.o. female left subclavian artery status post tenting September 2023, coronary artery disease status post PCI, chronic systolic heart failure status post Medtronic CRT-D, severe mitral regurgitation status post repair presented with chest pain  Assessment & Plan    Chest pain Coronary artery disease Chronic systolic heart failure status post CRT-D  Severe mitral regurgitation status post repair Hypertension Hyperlipidemia Subclavian artery stenosis  Presented with chest pain and mild troponin elevation, agree with heparin drip for now giving history of coronary artery disease and mild CAD on  previous left heart catheterization.  Given the characteristics also with deep inspiration I am going to add ESR and CRP to make sure that pericarditis may not be playing a role here. Her echocardiogram does not show any wall motion abnormality even EF is mildly depressed, for now we will continue with medical management with the heparin drip as well as her aspirin and beta-blocker.  Clinically she does not appear to be volume overloaded.    For questions or updates, please contact [facility] Please consult [email or web address] for contact info under Cardiology/STEMI.      Signed, Thomasene Ripple, DO  12/30/2022, 10:41 AM

## 2022-12-30 NOTE — Plan of Care (Signed)
?  Problem: Coping: ?Goal: Level of anxiety will decrease ?Outcome: Progressing ?  ?Problem: Safety: ?Goal: Ability to remain free from injury will improve ?Outcome: Progressing ?  ?

## 2022-12-30 NOTE — Progress Notes (Signed)
*  RESULTS* Echocardiogram 2D Echocardiogram has been performed.  Paolo Okane N Ranald Alessio,RDCS 12/30/2022, 10:31 AM

## 2022-12-30 NOTE — Plan of Care (Signed)
  Problem: Education: Goal: Knowledge of General Education information will improve Description Including pain rating scale, medication(s)/side effects and non-pharmacologic comfort measures Outcome: Progressing   Problem: Health Behavior/Discharge Planning: Goal: Ability to manage health-related needs will improve Outcome: Progressing   

## 2022-12-30 NOTE — Progress Notes (Signed)
PHARMACY - ANTICOAGULATION CONSULT NOTE  Pharmacy Consult for heparin Indication: chest pain/ACS  Allergies  Allergen Reactions   Aldomet [Methyldopa] Other (See Comments)    Severe hypotension   Brilinta [Ticagrelor] Shortness Of Breath   Other Other (See Comments)    Nuclear Stress Test Medication caused seizures   Coumadin [Warfarin Sodium] Swelling    Arm swelling   Desyrel [Trazodone] Other (See Comments)    Could not wake up from medication, comatosed   Eliquis [Apixaban] Other (See Comments)    BP got too low   Entresto [Sacubitril-Valsartan] Hypertension    Hypotension    Zestril [Lisinopril]     Significant blood pressure fluctuations    Penicillins Rash    Has patient had a PCN reaction causing immediate rash, facial/tongue/throat swelling, SOB or lightheadedness with hypotension: Yes Has patient had a PCN reaction causing severe rash involving mucus membranes or skin necrosis: No Has patient had a PCN reaction that required hospitalization: No Has patient had a PCN reaction occurring within the last 10 years: No If all of the above answers are "NO", then may proceed with Cephalosporin use.     Patient Measurements: Height: 5\' 1"  (154.9 cm) Weight: 48 kg (105 lb 14.4 oz) IBW/kg (Calculated) : 47.8 Height 5'1" Heparin Dosing Weight:  48 kg  Vital Signs: Temp: 98.6 F (37 C) (12/28 0750) Temp Source: Oral (12/28 0750) BP: 149/75 (12/28 0750) Pulse Rate: 71 (12/28 0750)  Labs: Recent Labs    12/29/22 1852 12/29/22 2041 12/30/22 0239  HGB  --  14.3 14.0  HCT  --  44.2 42.1  PLT  --  172 169  HEPARINUNFRC 0.64  --  0.47  CREATININE  --  0.87 0.94  TROPONINIHS 11 10  --     Estimated Creatinine Clearance: 49.8 mL/min (by C-G formula based on SCr of 0.94 mg/dL).   Medical History: Past Medical History:  Diagnosis Date   Acute myocardial infarction of other lateral wall, initial episode of care 01/2013   DES CFX   Aortoiliac occlusive disease (HCC)  08/22/2016   CAD (coronary artery disease) 11/13/2011   50% ? 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 No troponin elevation at outside ED, although BNP was elevated. No anticoagulation prior to admission. Pharmacy consulted for heparin.    Heparin level this AM 0.47. Patient scheduled for ECHO with possible cath if  EF decreased per cards note.   Will follow-up with plan for possible cath.    Goal of Therapy:  Heparin level 0.3-0.7 units/ml Monitor platelets by anticoagulation protocol: Yes  Plan Continue Heparin at 650 units /hr Monitor daily heparin level, CBC, signs/symptoms of bleeding  F/u Cath plans   Jeramie Scogin A. Jeanella Craze, PharmD, BCPS, FNKF Clinical Pharmacist [place] Please utilize Amion for appropriate phone number to reach the unit pharmacist [facility])   Please check AMION for all [facility] Pharmacy phone numbers After 10:00 PM, call Main Pharmacy [phone]

## 2022-12-31 DIAGNOSIS — R079 Chest pain, unspecified: Secondary | ICD-10-CM | POA: Diagnosis not present

## 2022-12-31 LAB — CBC
HCT: 45.8 % (ref 36.0–46.0)
Hemoglobin: 15.1 g/dL — ABNORMAL HIGH (ref 12.0–15.0)
MCH: 28.9 pg (ref 26.0–34.0)
MCHC: 33 g/dL (ref 30.0–36.0)
MCV: 87.6 fL (ref 80.0–100.0)
Platelets: 167 10*3/uL (ref 150–400)
RBC: 5.23 MIL/uL — ABNORMAL HIGH (ref 3.87–5.11)
RDW: 14.1 % (ref 11.5–15.5)
WBC: 7.7 10*3/uL (ref 4.0–10.5)
nRBC: 0 % (ref 0.0–0.2)

## 2022-12-31 LAB — BASIC METABOLIC PANEL
Anion gap: 16 — ABNORMAL HIGH (ref 5–15)
BUN: 18 mg/dL (ref 6–20)
CO2: 16 mmol/L — ABNORMAL LOW (ref 22–32)
Calcium: 8.8 mg/dL — ABNORMAL LOW (ref 8.9–10.3)
Chloride: 106 mmol/L (ref 98–111)
Creatinine, Ser: 1.08 mg/dL — ABNORMAL HIGH (ref 0.44–1.00)
GFR, Estimated: 60 mL/min — ABNORMAL LOW (ref >60)
Glucose, Bld: 85 mg/dL (ref 70–99)
Potassium: 4.1 mmol/L (ref 3.5–5.1)
Sodium: 138 mmol/L (ref 135–145)

## 2022-12-31 LAB — HEPARIN LEVEL (UNFRACTIONATED)
Heparin Unfractionated: 0.25 [IU]/mL — ABNORMAL LOW (ref 0.30–0.70)
Heparin Unfractionated: <0.1 [IU]/mL — ABNORMAL LOW (ref 0.30–0.70)

## 2022-12-31 MED ORDER — METOPROLOL SUCCINATE ER 25 MG PO TB24
12.5000 mg | ORAL_TABLET | Freq: Every day | ORAL | Status: DC
  Administered 2022-12-31: 12.5 mg via ORAL
  Filled 2022-12-31: qty 1

## 2022-12-31 MED ORDER — COLCHICINE 0.6 MG PO TABS
0.6000 mg | ORAL_TABLET | Freq: Two times a day (BID) | ORAL | Status: DC
  Administered 2022-12-31: 0.6 mg via ORAL
  Filled 2022-12-31: qty 1

## 2022-12-31 MED ORDER — COLCHICINE 0.6 MG PO TABS: 0.6000 mg | ORAL_TABLET | Freq: Two times a day (BID) | ORAL | 2 refills | Status: DC

## 2022-12-31 MED ORDER — METOPROLOL SUCCINATE ER 25 MG PO TB24: 12.5000 mg | ORAL_TABLET | Freq: Every day | ORAL | 5 refills | Status: DC

## 2022-12-31 NOTE — Progress Notes (Signed)
Mobility Specialist Progress Note:   12/31/22 1000  Mobility  Activity Ambulated independently in hallway  Level of Assistance Modified independent, requires aide device or extra time  Assistive Device None  Distance Ambulated (ft) 300 ft  Activity Response Tolerated well  Mobility Referral Yes  Mobility visit 1 Mobility  Mobility Specialist Start Time (ACUTE ONLY) 1000  Mobility Specialist Stop Time (ACUTE ONLY) 1015  Mobility Specialist Time Calculation (min) (ACUTE ONLY) 15 min   Pt agreeable to mobility session. Required no physical assistance throughout. Pt denies any CP, SOB. Back in room with all needs met.   Addison Lank Mobility Specialist Please contact via SecureChat or  Rehab office at [phone]

## 2022-12-31 NOTE — Progress Notes (Signed)
PHARMACY - ANTICOAGULATION Pharmacy Consult for heparin Indication: chest pain/ACS Brief A/P: Heparin level subtherapeutic Increase Heparin rate  Allergies  Allergen Reactions   Aldomet [Methyldopa] Other (See Comments)    Severe hypotension   Brilinta [Ticagrelor] Shortness Of Breath   Other Other (See Comments)    Nuclear Stress Test Medication caused seizures   Coumadin [Warfarin Sodium] Swelling    Arm swelling   Desyrel [Trazodone] Other (See Comments)    Could not wake up from medication, comatosed   Eliquis [Apixaban] Other (See Comments)    BP got too low   Entresto [Sacubitril-Valsartan] Hypertension    Hypotension    Zestril [Lisinopril]     Significant blood pressure fluctuations    Penicillins Rash    Has patient had a PCN reaction causing immediate rash, facial/tongue/throat swelling, SOB or lightheadedness with hypotension: Yes Has patient had a PCN reaction causing severe rash involving mucus membranes or skin necrosis: No Has patient had a PCN reaction that required hospitalization: No Has patient had a PCN reaction occurring within the last 10 years: No If all of the above answers are "NO", then may proceed with Cephalosporin use.     Patient Measurements: Height: 5\' 1"  (154.9 cm) Weight: 48 kg (105 lb 14.4 oz) IBW/kg (Calculated) : 47.8 Height 5'1" Heparin Dosing Weight:  48 kg  Vital Signs: Temp: 97.7 F (36.5 C) (12/29 0015) Temp Source: Oral (12/29 0015) BP: 132/59 (12/29 0015) Pulse Rate: 58 (12/29 0015)  Labs: Recent Labs    12/29/22 1852 12/29/22 2041 12/30/22 0239 12/31/22 0206  HGB  --  14.3 14.0  --   HCT  --  44.2 42.1  --   PLT  --  172 169  --   HEPARINUNFRC 0.64  --  0.47 0.25*  CREATININE  --  0.87 0.94 1.08*  TROPONINIHS 11 10  --   --     Estimated Creatinine Clearance: 43.4 mL/min (A) (by C-G formula based on SCr of 1.08 mg/dL (H)).  Assessment: 57 y.o. female with chest pain for heparin   Goal of Therapy:  Heparin  level 0.3-0.7 units/ml Monitor platelets by anticoagulation protocol: Yes   Plan Increase Heparin 750 units/hr  Geannie Risen, PharmD, BCPS

## 2022-12-31 NOTE — Progress Notes (Signed)
Progress Note  Patient Name: Lindsay Camacho Date of Encounter: 12/31/2022  Primary Cardiologist: None   Subjective   Patient seen and examined by her bedside.chest pain has improve  Inpatient Medications    Scheduled Meds:  aspirin EC  81 mg Oral Daily   carvedilol  6.25 mg Oral BID   clopidogrel  75 mg Oral Daily   losartan  50 mg Oral Daily   pantoprazole  40 mg Oral Daily   rosuvastatin  40 mg Oral Daily   sodium chloride flush  3 mL Intravenous Q12H   spironolactone  12.5 mg Oral Daily   Continuous Infusions:  heparin 750 Units/hr (12/31/22 0518)   PRN Meds: acetaminophen, cyclobenzaprine, ondansetron (ZOFRAN) IV, sodium chloride flush   Vital Signs    Vitals:   12/30/22 2010 12/31/22 0015 12/31/22 0500 12/31/22 0728  BP: (!) 153/64 (!) 132/59 110/61 128/67  Pulse: 65 (!) 58 60 61  Resp: 20 20 20 18   Temp: 97.7 F (36.5 C) 97.7 F (36.5 C) (!) 97.5 F (36.4 C) (!) 97.5 F (36.4 C)  TempSrc: Oral Oral Oral Oral  SpO2: 97% 96% 100% 97%  Weight:   47.3 kg   Height:        Intake/Output Summary (Last 24 hours) at 12/31/2022 0901 Last data filed at 12/31/2022 0221 Gross per 24 hour  Intake 352.99 ml  Output --  Net 352.99 ml   Filed Weights   12/29/22 1832 12/30/22 0536 12/31/22 0500  Weight: 47.8 kg 48 kg 47.3 kg    Telemetry     - Personally Reviewed  ECG      - Personally Reviewed  Physical Exam    General: Comfortable Head: Atraumatic, normal size  Eyes: PEERLA, EOMI  Neck: Supple, normal JVD Cardiac: Normal S1, S2; RRR; no murmurs, rubs, or gallops Lungs: Clear to auscultation bilaterally Abd: Soft, nontender, no hepatomegaly  Ext: warm, no edema Musculoskeletal: No deformities, BUE and BLE strength normal and equal Skin: Warm and dry, no rashes   Neuro: Alert and oriented to person, place, time, and situation, CNII-XII grossly intact, no focal deficits  Psych: Normal mood and affect   Labs    Chemistry Recent Labs  Lab  12/29/22 2041 12/30/22 0239 12/31/22 0206  NA  --  138 138  K  --  3.3* 4.1  CL  --  102 106  CO2  --  27 16*  GLUCOSE  --  95 85  BUN  --  10 18  CREATININE 0.87 0.94 1.08*  CALCIUM  --  9.1 8.8*  GFRNONAA >60 >60 60*  ANIONGAP  --  9 16*     Hematology Recent Labs  Lab 12/29/22 2041 12/30/22 0239 12/31/22 0439  WBC 14.0* 10.6* 7.7  RBC 5.01 4.85 5.23*  HGB 14.3 14.0 15.1*  HCT 44.2 42.1 45.8  MCV 88.2 86.8 87.6  MCH 28.5 28.9 28.9  MCHC 32.4 33.3 33.0  RDW 14.0 14.0 14.1  PLT 172 169 167    Cardiac EnzymesNo results for input(s): "TROPONINI" in the last 168 hours. No results for input(s): "TROPIPOC" in the last 168 hours.   BNPNo results for input(s): "BNP", "PROBNP" in the last 168 hours.   DDimer No results for input(s): "DDIMER" in the last 168 hours.   Radiology    ECHOCARDIOGRAM COMPLETE Result Date: 12/30/2022    ECHOCARDIOGRAM REPORT   Patient Name:   Lindsay Camacho Date of Exam: 12/30/2022 Medical Rec #:  [id number]  Height:       61.0 in Accession #:    [id number]    Weight:       105.9 lb Date of Birth:  1965/08/21     BSA:          1.442 m Patient Age:    57 years      BP:           141/82 mmHg Patient Gender: F             HR:           65 bpm. Exam Location:  Inpatient Procedure: 2D Echo, 3D Echo, Color Doppler, Cardiac Doppler and Strain Analysis Indications:    Chest Pain  History:        Patient has prior history of Echocardiogram examinations, most                 recent 03/26/2022. CHF and Non Ischemic Cardiomyopathy, Acute MI                 and CAD, Pacemaker, Stroke, Mitral Valve Disease,                 Arrythmias:LBBB, Signs/Symptoms:Chest Pain and Dyspnea; Risk                 Factors:Dyslipidemia, Hypertension and Current Smoker. Blair Heys IMR MV Replacement.  Sonographer:    Raeford Razor RDCS Referring Phys: [phone] HAO MENG IMPRESSIONS  1. Left ventricular ejection fraction, by estimation, is 55%. The left ventricle has  normal function. The left ventricle has no regional wall motion abnormalities. There is mild left ventricular hypertrophy. Left ventricular diastolic parameters are indeterminate. The average left ventricular global longitudinal strain is -15.7 %. The global longitudinal strain is normal.  2. Catheter in RA/RV . Right ventricular systolic function is normal. The right ventricular size is normal.  3. Left atrial size was mildly dilated.  4. Post annuloplasty ring repair with trivial residual MR mean diastolic gradient 3 mmHg at HR of 67 bpm. The mitral valve has been repaired/replaced. No evidence of mitral valve regurgitation. No evidence of mitral stenosis. Procedure Date: 08/25/2016.  5. Tricuspid valve regurgitation is severe.  6. The aortic valve is tricuspid. There is moderate calcification of the aortic valve. There is moderate thickening of the aortic valve. Aortic valve regurgitation is mild. Mild aortic valve stenosis.  7. The inferior vena cava is normal in size with greater than 50% respiratory variability, suggesting right atrial pressure of 3 mmHg. FINDINGS  Left Ventricle: Left ventricular ejection fraction, by estimation, is 55%. The left ventricle has normal function. The left ventricle has no regional wall motion abnormalities. The average left ventricular global longitudinal strain is -15.7 %. The global longitudinal strain is normal. The left ventricular internal cavity size was normal in size. There is mild left ventricular hypertrophy. Left ventricular diastolic parameters are indeterminate. Right Ventricle: Catheter in RA/RV. The right ventricular size is normal. No increase in right ventricular wall thickness. Right ventricular systolic function is normal. Left Atrium: Left atrial size was mildly dilated. Right Atrium: Right atrial size was normal in size. Pericardium: There is no evidence of pericardial effusion. Mitral Valve: Post annuloplasty ring repair with trivial residual MR mean  diastolic gradient 3 mmHg at HR of 67 bpm. The mitral valve has been repaired/replaced. No evidence of mitral valve regurgitation. No evidence of mitral  valve stenosis. MV peak gradient, 10.1 mmHg. The mean mitral valve gradient is 3.0 mmHg. Tricuspid Valve: The tricuspid valve is normal in structure. Tricuspid valve regurgitation is severe. No evidence of tricuspid stenosis. Aortic Valve: The aortic valve is tricuspid. There is moderate calcification of the aortic valve. There is moderate thickening of the aortic valve. Aortic valve regurgitation is mild. Aortic regurgitation PHT measures 444 msec. Mild aortic stenosis is present. Aortic valve mean gradient measures 5.0 mmHg. Aortic valve peak gradient measures 10.2 mmHg. Aortic valve area, by VTI measures 1.85 cm. Pulmonic Valve: The pulmonic valve was normal in structure. Pulmonic valve regurgitation is mild. No evidence of pulmonic stenosis. Aorta: The aortic root is normal in size and structure. Venous: The inferior vena cava is normal in size with greater than 50% respiratory variability, suggesting right atrial pressure of 3 mmHg. IAS/Shunts: No atrial level shunt detected by color flow Doppler. Additional Comments: A device lead is visualized.  LEFT VENTRICLE PLAX 2D LVIDd:         3.90 cm   Diastology LVIDs:         2.90 cm   LV e' medial:    5.26 cm/s LV PW:         1.20 cm   LV E/e' medial:  27.2 LV IVS:        1.20 cm   LV e' lateral:   6.67 cm/s LVOT diam:     1.80 cm   LV E/e' lateral: 21.4 LV SV:         55 LV SV Index:   38        2D Longitudinal Strain LVOT Area:     2.54 cm  2D Strain GLS Avg:     -15.7 %                           3D Volume EF:                          3D EF:        52 %                          LV EDV:       130 ml                          LV ESV:       62 ml                          LV SV:        68 ml RIGHT VENTRICLE             IVC RV Basal diam:  3.10 cm     IVC diam: 1.80 cm RV S prime:     11.60 cm/s TAPSE (M-mode): 1.8 cm  LEFT ATRIUM             Index        RIGHT ATRIUM           Index LA diam:        3.80 cm 2.64 cm/m   RA Area:     18.60 cm LA Vol (A2C):   80.8 ml 56.05 ml/m  RA Volume:   53.20 ml  36.91 ml/m LA Vol (A4C):   37.3 ml  25.88 ml/m LA Biplane Vol: 58.0 ml 40.24 ml/m  AORTIC VALVE AV Area (Vmax):    1.77 cm AV Area (Vmean):   1.80 cm AV Area (VTI):     1.85 cm AV Vmax:           160.00 cm/s AV Vmean:          102.000 cm/s AV VTI:            0.300 m AV Peak Grad:      10.2 mmHg AV Mean Grad:      5.0 mmHg LVOT Vmax:         111.00 cm/s LVOT Vmean:        72.300 cm/s LVOT VTI:          0.218 m LVOT/AV VTI ratio: 0.73 AI PHT:            444 msec  AORTA Ao Root diam: 2.30 cm Ao Asc diam:  3.00 cm MITRAL VALVE                TRICUSPID VALVE MV Area (PHT): 2.76 cm     TR Peak grad:   32.5 mmHg MV Area VTI:   1.28 cm     TR Mean grad:   17.0 mmHg MV Peak grad:  10.1 mmHg    TR Vmax:        285.00 cm/s MV Mean grad:  3.0 mmHg     TR Vmean:       190.0 cm/s MV Vmax:       1.59 m/s MV Vmean:      85.9 cm/s    SHUNTS MV Decel Time: 275 msec     Systemic VTI:  0.22 m MV E velocity: 143.00 cm/s  Systemic Diam: 1.80 cm MV A velocity: 78.60 cm/s MV E/A ratio:  1.82 Charlton Haws MD Electronically signed by Charlton Haws MD Signature Date/Time: 12/30/2022/10:48:08 AM    Final     Cardiac Studies   TTE 12/28 IMPRESSIONS   1. Left ventricular ejection fraction, by estimation, is 55%. The left  ventricle has normal function. The left ventricle has no regional wall  motion abnormalities. There is mild left ventricular hypertrophy. Left  ventricular diastolic parameters are  indeterminate. The average left ventricular global longitudinal strain is  -15.7 %. The global longitudinal strain is normal.   2. Catheter in RA/RV . Right ventricular systolic function is normal. The  right ventricular size is normal.   3. Left atrial size was mildly dilated.   4. Post annuloplasty ring repair with trivial residual MR mean  diastolic   Patient Profile     57 y.o. female left subclavian artery status post tenting September 2023, coronary artery disease status post PCI, chronic systolic heart failure status post Medtronic CRT-D, severe mitral regurgitation status post repair presented with chest pain  Assessment & Plan    Chest pain Coronary artery disease Chronic systolic heart failure status post CRT-D  Severe mitral regurgitation status post repair Hypertension Hyperlipidemia Subclavian artery stenosis  Presented with chest pain and mild troponin elevation, echocardiogram normal with no wall motion abnormalities, given characteristics ESR and CRP was done which is elevated therefore I will treat the patient for acute pericarditis.  Will start colchicine.  I am going to stop the heparin drip.  With the use of colchicine I am going to switch the patient to metoprolol and stop the carvedilol.  Continue her dual antiplatelet therapy.  Continue Crestor.   Her echocardiogram  does not show any wall motion abnormality, however slightly mild compared to prior.  She is already on guideline directed medical therapy with an ARB, Aldactone, beta-blocker.  Clinically she does not appear to be volume overloaded.  I will ask the team to walk her and if she does well we can discharge to home.    For questions or updates, please contact [facility] Please consult [email or web address] for contact info under Cardiology/STEMI.      Signed, Thomasene Ripple, DO  12/31/2022, 9:01 AM

## 2022-12-31 NOTE — Discharge Summary (Signed)
Discharge Summary    Patient ID: Lindsay Camacho MRN: [id number]; DOB: 1965/05/10  Admit date: 12/29/2022 Discharge date: 12/31/2022  PCP:  Lindsay Mood, MD   [facility] Providers Cardiologist:  None  Electrophysiologist:  Lewayne Bunting, MD  [facility] Cardiologist:  Lorine Bears, MD       Discharge Diagnoses    Principal Problem:   Chest pain at rest Active Problems:   HTN (hypertension)   Tobacco abuse   Hyperlipidemia with target LDL less than 70   Chest pain, rule out acute myocardial infarction   Unstable angina [facility])    Diagnostic Studies/Procedures    Echocardiogram 12/30/2022 1. Left ventricular ejection fraction, by estimation, is 55%. The left  ventricle has normal function. The left ventricle has no regional wall  motion abnormalities. There is mild left ventricular hypertrophy. Left  ventricular diastolic parameters are  indeterminate. The average left ventricular global longitudinal strain is  -15.7 %. The global longitudinal strain is normal.   2. Catheter in RA/RV . Right ventricular systolic function is normal. The  right ventricular size is normal.   3. Left atrial size was mildly dilated.   4. Post annuloplasty ring repair with trivial residual MR mean diastolic  gradient 3 mmHg at HR of 67 bpm. The mitral valve has been  repaired/replaced. No evidence of mitral valve regurgitation. No evidence  of mitral stenosis. Procedure Date: 08/25/2016.   5. Tricuspid valve regurgitation is severe.   6. The aortic valve is tricuspid. There is moderate calcification of the  aortic valve. There is moderate thickening of the aortic valve. Aortic  valve regurgitation is mild. Mild aortic valve stenosis.   7. The inferior vena cava is normal in size with greater than 50%  respiratory variability, suggesting right atrial pressure of 3 mmHg.   _____________   History of Present Illness     Lindsay Camacho is a 57 y.o. female with  left subclavian artery  stenting 9/23, CAD s/p PCI of LCx 2015, chronic systolic heart failure s/p Medtronic CRT-D with improved EF, severe MR s/p repair 2018, HLD, tobacco abuse and LBBB who is being seen 12/29/2022 for the evaluation of chest pain.   Patient was previously followed by Dr. Eldridge Dace.  She had a lateral MI in 2015 treated with PCI of left circumflex artery.  Procedure was complicated by retroperitoneal bleed requiring vascular surgery.  Repeat left and right heart cath prior to valve surgery in August 2018 showed patent left circumflex stent with minimal restenosis and mild nonobstructive CAD in RCA and LAD.  She underwent minimally invasive mitral valve repair by Dr. Cornelius Moras for severe symptomatic MR on 08/25/2016.  She underwent Medtronic CRT-D implantation in January 2019 for EF of 20 to 25%.  She was admitted at [facility] in January 2021 for worsening shortness of breath due to acute on chronic systolic heart failure.  She underwent IV diuresis.  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  She underwent successful angioplasty and the balloon expandable stent placement to the left circumflex artery.  She was admitted in March 2024 with stroke that was thought to be due to small vessel disease.  MRI showed 8 mm acute infarct of left corona radiata.  CTA showed widely patent left subclavian stent with nonobstructive disease in the carotid arteries.  Echocardiogram showed EF 55 to 60% with mild pulmonary hypertension and normal functioning mitral valve prosthesis, mild to moderate  aortic stenosis.  Home medication include aspirin, Lipitor, carvedilol, Plavix, as needed Lasix, spironolactone and losartan.  According to the patient, she has not required any Lasix recently.   She presented to [facility] on 12/28/2022 after developed chest pain at rest.  Blood pressure on arrival was 177/82.  She has substernal chest discomfort and  also chest pain under the left armpit.  She has shortness of breath and headache but no fever or cough.  Chest discomfort worsens with deep inspiration.  She was placed on nitroglycerin drip.  Serial high-sensitivity troponin were borderline elevated but remained flat at 17-->20-->16.  proBNP 2898.  Hemoglobin 15.8.  CTA of the chest obtained on 12/28/2022 showed no filling defect to suggest PE, moderate cardiomegaly with some right heart enlargement and some reflux of contrast into the hepatic vein which may be an indicator for right heart dysfunction, left subclavian artery stent noted, coronary artery disease noted, stable scarring of the right middle lobe and lateral right upper lobe.  Patient given one dose of 40mg  IV lasix and one dose of 40 meq KCl. The patient was subsequently transferred to [facility] for further evaluation.  EKG showed atrial sensed ventricular paced rhythm.   It was difficult to obtain history from the patient as she has discomfort everywhere.  Hospital Course     Consultants: N/A   Patient was admitted to cardiology service.  Her chest pain was pleuritic in nature.  Echocardiogram was obtained on 12/30/2022 which showed EF 55%, mild LVH, annuloplasty mitral ring with trivial residual MR, severe TR, mild AI, mild aortic stenosis.  She was reexamined on 12/31/2022 at which time her chest pain has improved.  There was no wall motion abnormality on echocardiogram.  CRP and the ESR were elevated.  Patient was treated for presumed acute pericarditis with colchicine.  Carvedilol was switched to metoprolol.  She was deemed stable for discharge from the cardiac perspective.      Did the patient have an acute coronary syndrome (MI, NSTEMI, STEMI, etc) this admission?:  No.   The elevated Troponin was due to the acute medical illness (demand ischemia).         _____________  Discharge Vitals Blood pressure 129/71, pulse (!) 57, temperature 97.6 F (36.4 C),  temperature source Oral, resp. rate 18, height 5\' 1"  (1.549 m), weight 47.3 kg, SpO2 97%.  Filed Weights   12/29/22 1832 12/30/22 0536 12/31/22 0500  Weight: 47.8 kg 48 kg 47.3 kg    Labs & Radiologic Studies    CBC Recent Labs    12/30/22 0239 12/31/22 0439  WBC 10.6* 7.7  HGB 14.0 15.1*  HCT 42.1 45.8  MCV 86.8 87.6  PLT 169 167   Basic Metabolic Panel Recent Labs    57/84/69 0239 12/31/22 0206  NA 138 138  K 3.3* 4.1  CL 102 106  CO2 27 16*  GLUCOSE 95 85  BUN 10 18  CREATININE 0.94 1.08*  CALCIUM 9.1 8.8*   Liver Function Tests No results for input(s): "AST", "ALT", "ALKPHOS", "BILITOT", "PROT", "ALBUMIN" in the last 72 hours. No results for input(s): "LIPASE", "AMYLASE" in the last 72 hours. High Sensitivity Troponin:   Recent Labs  Lab 12/29/22 1852 12/29/22 2041  TROPONINIHS 11 10    BNP Invalid input(s): "POCBNP" D-Dimer No results for input(s): "DDIMER" in the last 72 hours. Hemoglobin A1C No results for input(s): "HGBA1C" in the last 72 hours. Fasting Lipid Panel Recent Labs    12/29/22 1852  CHOL 172  HDL 53  LDLCALC 108*  TRIG 55  CHOLHDL 3.2   Thyroid Function Tests No results for input(s): "TSH", "T4TOTAL", "T3FREE", "THYROIDAB" in the last 72 hours.  Invalid input(s): "FREET3" _____________  ECHOCARDIOGRAM COMPLETE Result Date: 12/30/2022    ECHOCARDIOGRAM REPORT   Patient Name:   Lindsay Camacho Date of Exam: 12/30/2022 Medical Rec #:  [id number]     Height:       61.0 in Accession #:    [id number]    Weight:       105.9 lb Date of Birth:  [birth year]     BSA:          1.442 m Patient Age:    57 years      BP:           141/82 mmHg Patient Gender: F             HR:           65 bpm. Exam Location:  Inpatient Procedure: 2D Echo, 3D Echo, Color Doppler, Cardiac Doppler and Strain Analysis Indications:    Chest Pain  History:        Patient has prior history of Echocardiogram examinations, most                 recent 03/26/2022. CHF and Non  Ischemic Cardiomyopathy, Acute MI                 and CAD, Pacemaker, Stroke, Mitral Valve Disease,                 Arrythmias:LBBB, Signs/Symptoms:Chest Pain and Dyspnea; Risk                 Factors:Dyslipidemia, Hypertension and Current Smoker. Blair Heys IMR MV Replacement.  Sonographer:    Raeford Razor RDCS Referring Phys: [phone] Patricia Perales IMPRESSIONS  1. Left ventricular ejection fraction, by estimation, is 55%. The left ventricle has normal function. The left ventricle has no regional wall motion abnormalities. There is mild left ventricular hypertrophy. Left ventricular diastolic parameters are indeterminate. The average left ventricular global longitudinal strain is -15.7 %. The global longitudinal strain is normal.  2. Catheter in RA/RV . Right ventricular systolic function is normal. The right ventricular size is normal.  3. Left atrial size was mildly dilated.  4. Post annuloplasty ring repair with trivial residual MR mean diastolic gradient 3 mmHg at HR of 67 bpm. The mitral valve has been repaired/replaced. No evidence of mitral valve regurgitation. No evidence of mitral stenosis. Procedure Date: 08/25/2016.  5. Tricuspid valve regurgitation is severe.  6. The aortic valve is tricuspid. There is moderate calcification of the aortic valve. There is moderate thickening of the aortic valve. Aortic valve regurgitation is mild. Mild aortic valve stenosis.  7. The inferior vena cava is normal in size with greater than 50% respiratory variability, suggesting right atrial pressure of 3 mmHg. FINDINGS  Left Ventricle: Left ventricular ejection fraction, by estimation, is 55%. The left ventricle has normal function. The left ventricle has no regional wall motion abnormalities. The average left ventricular global longitudinal strain is -15.7 %. The global longitudinal strain is normal. The left ventricular internal cavity size was normal in size. There is mild left ventricular hypertrophy.  Left ventricular diastolic parameters are indeterminate. Right Ventricle: Catheter in RA/RV. The right ventricular size is normal. No increase in right  ventricular wall thickness. Right ventricular systolic function is normal. Left Atrium: Left atrial size was mildly dilated. Right Atrium: Right atrial size was normal in size. Pericardium: There is no evidence of pericardial effusion. Mitral Valve: Post annuloplasty ring repair with trivial residual MR mean diastolic gradient 3 mmHg at HR of 67 bpm. The mitral valve has been repaired/replaced. No evidence of mitral valve regurgitation. No evidence of mitral valve stenosis. MV peak gradient, 10.1 mmHg. The mean mitral valve gradient is 3.0 mmHg. Tricuspid Valve: The tricuspid valve is normal in structure. Tricuspid valve regurgitation is severe. No evidence of tricuspid stenosis. Aortic Valve: The aortic valve is tricuspid. There is moderate calcification of the aortic valve. There is moderate thickening of the aortic valve. Aortic valve regurgitation is mild. Aortic regurgitation PHT measures 444 msec. Mild aortic stenosis is present. Aortic valve mean gradient measures 5.0 mmHg. Aortic valve peak gradient measures 10.2 mmHg. Aortic valve area, by VTI measures 1.85 cm. Pulmonic Valve: The pulmonic valve was normal in structure. Pulmonic valve regurgitation is mild. No evidence of pulmonic stenosis. Aorta: The aortic root is normal in size and structure. Venous: The inferior vena cava is normal in size with greater than 50% respiratory variability, suggesting right atrial pressure of 3 mmHg. IAS/Shunts: No atrial level shunt detected by color flow Doppler. Additional Comments: A device lead is visualized.  LEFT VENTRICLE PLAX 2D LVIDd:         3.90 cm   Diastology LVIDs:         2.90 cm   LV e' medial:    5.26 cm/s LV PW:         1.20 cm   LV E/e' medial:  27.2 LV IVS:        1.20 cm   LV e' lateral:   6.67 cm/s LVOT diam:     1.80 cm   LV E/e' lateral: 21.4 LV SV:          55 LV SV Index:   38        2D Longitudinal Strain LVOT Area:     2.54 cm  2D Strain GLS Avg:     -15.7 %                           3D Volume EF:                          3D EF:        52 %                          LV EDV:       130 ml                          LV ESV:       62 ml                          LV SV:        68 ml RIGHT VENTRICLE             IVC RV Basal diam:  3.10 cm     IVC diam: 1.80 cm RV S prime:     11.60 cm/s TAPSE (M-mode): 1.8 cm LEFT ATRIUM  Index        RIGHT ATRIUM           Index LA diam:        3.80 cm 2.64 cm/m   RA Area:     18.60 cm LA Vol (A2C):   80.8 ml 56.05 ml/m  RA Volume:   53.20 ml  36.91 ml/m LA Vol (A4C):   37.3 ml 25.88 ml/m LA Biplane Vol: 58.0 ml 40.24 ml/m  AORTIC VALVE AV Area (Vmax):    1.77 cm AV Area (Vmean):   1.80 cm AV Area (VTI):     1.85 cm AV Vmax:           160.00 cm/s AV Vmean:          102.000 cm/s AV VTI:            0.300 m AV Peak Grad:      10.2 mmHg AV Mean Grad:      5.0 mmHg LVOT Vmax:         111.00 cm/s LVOT Vmean:        72.300 cm/s LVOT VTI:          0.218 m LVOT/AV VTI ratio: 0.73 AI PHT:            444 msec  AORTA Ao Root diam: 2.30 cm Ao Asc diam:  3.00 cm MITRAL VALVE                TRICUSPID VALVE MV Area (PHT): 2.76 cm     TR Peak grad:   32.5 mmHg MV Area VTI:   1.28 cm     TR Mean grad:   17.0 mmHg MV Peak grad:  10.1 mmHg    TR Vmax:        285.00 cm/s MV Mean grad:  3.0 mmHg     TR Vmean:       190.0 cm/s MV Vmax:       1.59 m/s MV Vmean:      85.9 cm/s    SHUNTS MV Decel Time: 275 msec     Systemic VTI:  0.22 m MV E velocity: 143.00 cm/s  Systemic Diam: 1.80 cm MV A velocity: 78.60 cm/s MV E/A ratio:  1.82 Charlton Haws MD Electronically signed by Charlton Haws MD Signature Date/Time: 12/30/2022/10:48:08 AM    Final    Disposition   Pt is being discharged home today in good condition.  Follow-up Plans & Appointments     Follow-up Information     Levi Aland, NP Follow up on 01/22/2023.    Specialty: Cardiology Why: 1:55PM. Post hospital cardiology follow up Contact information: [address] [place] [address] [phone]                   Discharge Medications   Allergies as of 12/31/2022       Reactions   Aldomet [methyldopa] Other (See Comments)   Severe hypotension   Brilinta [ticagrelor] Shortness Of Breath   Other Other (See Comments)   Nuclear Stress Test Medication caused seizures   Coumadin [warfarin Sodium] Swelling   Arm swelling   Desyrel [trazodone] Other (See Comments)   Could not wake up from medication, comatosed   Eliquis [apixaban] Other (See Comments)   BP got too low   Entresto [sacubitril-valsartan] Hypertension   Hypotension    Zestril [lisinopril]    Significant blood pressure fluctuations    Penicillins Rash   Has patient had a  PCN reaction causing immediate rash, facial/tongue/throat swelling, SOB or lightheadedness with hypotension: Yes Has patient had a PCN reaction causing severe rash involving mucus membranes or skin necrosis: No Has patient had a PCN reaction that required hospitalization: No Has patient had a PCN reaction occurring within the last 10 years: No If all of the above answers are "NO", then may proceed with Cephalosporin use.        Medication List     STOP taking these medications    carvedilol 6.25 MG tablet Commonly known as: COREG       TAKE these medications    aspirin EC 81 MG tablet Take 81 mg by mouth daily.   atorvastatin 80 MG tablet Commonly known as: LIPITOR Take 1 tablet (80 mg total) by mouth daily.   clopidogrel 75 MG tablet Commonly known as: PLAVIX Take 1 tablet (75 mg total) by mouth daily.   colchicine 0.6 MG tablet Take 1 tablet (0.6 mg total) by mouth 2 (two) times daily.   cyclobenzaprine 10 MG tablet Commonly known as: FLEXERIL Take 1 tablet (10 mg total) by mouth 3 (three) times daily as needed for muscle spasms.   ergocalciferol 1.25 MG  (50000 UT) capsule Commonly known as: VITAMIN D2 Take 1 capsule (50,000 Units total) by mouth every Friday.   furosemide 40 MG tablet Commonly known as: LASIX Take 1 tablet (40 mg total) by mouth daily as needed for fluid or edema (shortness of breath).   ibuprofen 600 MG tablet Commonly known as: ADVIL Take 1 tablet (600 mg total) by mouth every 6 (six) hours as needed.   losartan 50 MG tablet Commonly known as: COZAAR Take 1 tablet (50 mg total) by mouth daily.   metoprolol succinate 25 MG 24 hr tablet Commonly known as: TOPROL-XL Take 0.5 tablets (12.5 mg total) by mouth daily. Start taking on: January 01, 2023   nicotine 14 mg/24hr patch Commonly known as: NICODERM CQ - dosed in mg/24 hours Apply daily for 6 weeks, then decrease to 7mg  daily for 2 weeks   oxyCODONE-acetaminophen 5-325 MG tablet Commonly known as: PERCOCET/ROXICET Take 1 tablet by mouth every 6 (six) hours as needed for up to 10 doses for severe pain.   pantoprazole 40 MG tablet Commonly known as: PROTONIX Take 1 tablet (40 mg total) by mouth daily.   SALONPAS PAIN RELIEF PATCH EX Apply 1 patch topically daily as needed (pain).   spironolactone 25 MG tablet Commonly known as: ALDACTONE Take 0.5 tablets (12.5 mg total) by mouth daily.           Outstanding Labs/Studies   N/A  Duration of Discharge Encounter   Greater than 30 minutes including physician time.  Ramond Dial, PA 12/31/2022, 11:36 AM

## 2023-01-01 ENCOUNTER — Encounter: Payer: Self-pay | Admitting: Neurology

## 2023-01-12 ENCOUNTER — Encounter: Payer: Self-pay | Admitting: Neurology

## 2023-01-18 ENCOUNTER — Ambulatory Visit: Payer: Medicare HMO | Admitting: Nurse Practitioner

## 2023-01-21 NOTE — Progress Notes (Unsigned)
Cardiology Office Note:  .   Date:  01/22/2023  ID:  Lindsay Camacho, DOB 02-26-1965, MRN [id number] PCP: Lindsay Mood, MD  [facility] Providers Cardiologist:  Lindsay Schultz, MD Electrophysiologist:  Lindsay Bunting, MD  PV Cardiologist:  Lindsay Bears, MD    Patient Profile: .      PMH Coronary artery disease S/p lateral MI 2015 treated with PCI to LCx Chronic HFrEF S/p ICD implant Severe MR s/p mitral valve repair 2018 Tobacco abuse Hyperlipidemia Bilateral leg pain Left BBB PVD  History of lateral MI 2015 treated with PCI to left circumflex.  Cardiac catheterization was complicated by retroperitoneal bleeding requiring vascular surgery.  She had another [facility] prior to valve surgery 08/2016 that showed patent LCx stent with minimal restenosis + mild nonobstructive CAD in RCA and LAD.  Chronic systolic heart failure with EF 35 to 40%.  She underwent minimally invasive mitral valve repair by Dr. Cornelius Camacho for severe symptomatic MR on 08/25/2016.  Postop recovery fairly uneventful.  She underwent Medtronic ICD implantation on 01/24/2017 following echo that showed worsening EF 20 to 25%.  Admission Lindsay Camacho on 01/17/2019 with worsening SOB secondary to a/C systolic heart failure.  She was diuresed with IV Lasix and then transitioned to oral.  Aortic arch angiography September 2023 showed occluded proximal left subclavian artery with retrograde flow in the left vertebral artery.  She underwent successful angioplasty and balloon expandable stent placement to left subclavian artery.  Admission March 2024 with stroke thought to be due to small vessel disease.  CTA showed widely patent left subclavian stent with nonobstructive disease in the carotid arteries.  Echo showed EF 55 to 60% with mild pulmonary hypertension and normal functioning mitral valve prosthesis.  Camacho admission for stroke with echo 03/16/2022 revealed LVEF 55 to 60%, no RWMA, diastolic function indeterminate, mild  LVH, mildly elevated pulmonary artery systolic pressure, mitral prosthetic ring present in mitral position with trivial MR, no mitral stenosis, mild to moderate AAS.  MRI revealed 8 mm acute infarct left corona radiata felt to be small vessel cardioembolic source.  Last cardiology clinic visit was 11/14/2022 with Lindsay Camacho. She had recent ED visit for elevated BP and blurred vision. CTA was unremarkable. She had had one inclisiran injection for hyperlipidemia. She felt it caused numbness in her legs and did not want to continue it. Losartan was increased to 50 mg daily for elevated BP. Smoking cessation was emphasized, she reported unable to quit on  Chantix. 6 month follow-up was advised.   Admission 12/28-12/29/24 for chest pain. She presented to [facility] on 12/28/2022 after developing chest pain at rest.  BP upon arrival was 177/82.  She reported substernal chest discomfort and chest pain under her left armpit.  She also reported shortness of breath and headache but no fever or cough.  Chest discomfort worsened with deep inspiration.  Serial hs trop elevated but flat 17>20>16, proBNP 2898.  CTA of chest showed no filling defects to suggest PE, moderate cardiomegaly with some right heart enlargement and some reflux of contrast into the hepatic vein which may be an indicator for right heart dysfunction, left subclavian artery stent noted, CAD noted, stable scarring of the right middle lobe and lateral right upper lobe.  She was given 1 dose of IV Lasix 40 mg and 40 mill equivalents potassium and transferred to Lindsay Camacho.  Echo 12/30/2022 showed EF 55%, mild LVH, annuloplasty mitral ring with trivial residual MR, severe TR, mild AI, and mild aortic stenosis.  CRP and ESR were elevated.  She was treated for presumed acute pericarditis with colchicine.  Carvedilol was switched to metoprolol.        History of Present Illness: .   Lindsay Camacho is a  58 y.o. female who is here today for Camacho follow-up.  Reports she is feeling especially sleepy today. She reports chest pain has resolved. She is taking colchicine twice daily since her Camacho discharge and has not had any concerning side effects. Does not monitor BP at home because she has been feeling well. She has been feeling extremely sleepy for the past couple of days, which she attributes to a low diastolic blood pressure. She has been taking a whole metoprolol instead of a half, which may be contributing to her low diastolic blood pressure. She also reports having a headache, which may be a side effect of her medications. She denies shortness of breath, palpitations, orthopnea, PND, edema, presyncope or syncope. Stopped taking her cholesterol shot due to unpleasant side effects. She was previously on Nexletol, which she stopped due to cost. She is willing to restart Nexletol if it can be made affordable. She also reports a desire to quit smoking and has tried patches and Chantix in the past. She found the patches to be effective but her insurance does not cover them  Discussed the use of AI scribe software for clinical note transcription with the patient, who gave verbal consent to proceed.   ROS: + fatigue "sleepiness"       Studies Reviewed: .         Risk Assessment/Calculations:     HYPERTENSION CONTROL Vitals:   01/22/23 1353 01/22/23 1450  BP: (!) 144/52 (!) 160/90    The patient's blood pressure is elevated above target today.  In order to address the patient's elevated BP:           Physical Exam:   VS:  BP (!) 160/90   Pulse 74   Ht 5' 1.5" (1.562 m)   Wt 110 lb (49.9 kg)   SpO2 97%   BMI 20.45 kg/m    Wt Readings from Last 3 Encounters:  01/22/23 110 lb (49.9 kg)  12/31/22 104 lb 4.8 oz (47.3 kg)  11/14/22 109 lb (49.4 kg)    GEN: Well nourished, well developed in no acute distress NECK: No JVD; No carotid bruits CARDIAC: RRR, 3/6 systolic murmur. No rubs, gallops RESPIRATORY:  Clear to auscultation without  rales, wheezing or rhonchi  ABDOMEN: Soft, non-tender, non-distended EXTREMITIES:  No edema; No deformity     ASSESSMENT AND PLAN: .    CAD without angina: History of lateral MI 2015 treated with PCI to LCx. Repeat cath 08/2016 revealed patent LCx stent with minimal restenosis and mild nonobstructive CAD in RCA and LAD.  She denies chest pain, dyspnea, or other symptoms concerning for angina.  No indication for further ischemic evaluation at this time. No bleeding concerns. BP is elevated today, will have her continue metoprolol 25 mg daily and follow BP at home. We will continue GDMT including aspirin, atorvastatin, Plavix, losartan, metoprolol, spironolactone.  ICM/Chronic HFrEF: EF down to 20-25% on echo 01/2017 at time of MV repair, she underwent ICD implant. Normalization of EF on echo 03/2020. Most recent echo 12/30/22 with normal LVEF 55%, mild LVH, indeterminate diastolic parameters, normal RV.  She appears euvolemic on exam. She denies chest pain, shortness of breath, orthopnea, PND, or edema. Weight is stable. We will continue GDMT including furosemide, losartan, metoprolol,  and spironolactone. Renal function stable on labs completed 12/31/2022.  ICD implanted: S/p Medtronic ICD implant 01/2017 secondary to chronic HFrEF/ICM and LBBB.  Normal device function on remote device check 11/13/2022.  No acute concerns today. Management per EP cardiology.   Hypertension: BP higher in right arm than in left. She does not monitor at home. She is concerned about diastolic BP in left arm of 52 mmHg. Will have her monitor at home and call in 2 weeks for follow-up.   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 She does demonstrate a difference in BP in each extremity. Bilateral pulses 2+, skin warm dry and pink. Will have her monitor home BP and report back. Management per Lindsay Camacho.   Mitral valve replacement: S/p mitral valve repair 2018. Echo 12/30/22  reveals trivial residual MR with mean diastolic gradient 3 mmHg, no evidence of MR or MS, EF 55%, mild LVH, indeterminate diastolic parameters. No acute concerns today.  We will continue to monitor clinically at this time. Continue SBE prophylaxis.   Aortic valve disease: Moderate calcification of the aortic valve with mild AI and mild aortic valve stenosis on echo 12/30/2022. She is asymptomatic. We will continue to monitor clinically for now.   Hyperlipidemia LDL goal < 70: Lipid panel completed 12/29/2022 with total cholesterol 172, HDL 53, LDL 108, and triglycerides 55.  Intolerant of PCSK9i - did not like the way she felt after taking it, unwilling to retry any of the injectable medications. Is willing to resume Nexletol if more affordable. Will ask pharmacy team to help with pricing.  Continue atorvastatin.  Tobacco use: Complete cessation advised. She wants to try nicotine patches but they have not been affordable in the past. We will resend Rx to see if they are now affordable.         Dispo: Keep your March appointment with Dr. Anne Fu  Signed, Eligha Bridegroom, NP-C

## 2023-01-22 ENCOUNTER — Encounter: Payer: Self-pay | Admitting: Nurse Practitioner

## 2023-01-22 ENCOUNTER — Ambulatory Visit: Payer: Medicare HMO | Attending: Nurse Practitioner | Admitting: Nurse Practitioner

## 2023-01-22 ENCOUNTER — Other Ambulatory Visit (HOSPITAL_COMMUNITY): Payer: Self-pay

## 2023-01-22 ENCOUNTER — Telehealth: Payer: Self-pay | Admitting: Pharmacy Technician

## 2023-01-22 VITALS — BP 160/90 | HR 74 | Ht 61.5 in | Wt 110.0 lb

## 2023-01-22 DIAGNOSIS — I5022 Chronic systolic (congestive) heart failure: Secondary | ICD-10-CM

## 2023-01-22 DIAGNOSIS — E785 Hyperlipidemia, unspecified: Secondary | ICD-10-CM

## 2023-01-22 DIAGNOSIS — I771 Stricture of artery: Secondary | ICD-10-CM

## 2023-01-22 DIAGNOSIS — Z720 Tobacco use: Secondary | ICD-10-CM

## 2023-01-22 DIAGNOSIS — Z95810 Presence of automatic (implantable) cardiac defibrillator: Secondary | ICD-10-CM

## 2023-01-22 DIAGNOSIS — I447 Left bundle-branch block, unspecified: Secondary | ICD-10-CM | POA: Diagnosis not present

## 2023-01-22 DIAGNOSIS — I255 Ischemic cardiomyopathy: Secondary | ICD-10-CM

## 2023-01-22 DIAGNOSIS — I2510 Atherosclerotic heart disease of native coronary artery without angina pectoris: Secondary | ICD-10-CM

## 2023-01-22 DIAGNOSIS — I359 Nonrheumatic aortic valve disorder, unspecified: Secondary | ICD-10-CM

## 2023-01-22 DIAGNOSIS — Z9889 Other specified postprocedural states: Secondary | ICD-10-CM

## 2023-01-22 MED ORDER — METOPROLOL SUCCINATE ER 25 MG PO TB24: 25.0000 mg | ORAL_TABLET | Freq: Every day | ORAL | 3 refills | Status: DC

## 2023-01-22 MED ORDER — NICOTINE 21 MG/24HR TD PT24
21.0000 mg | MEDICATED_PATCH | Freq: Every day | TRANSDERMAL | 0 refills | Status: DC
  Filled 2023-01-22: qty 28, 28d supply, fill #0
  Filled 2023-01-22: qty 28, fill #0
  Filled 2023-01-22: qty 28, 28d supply, fill #0

## 2023-01-22 MED ORDER — NICOTINE 7 MG/24HR TD PT24
7.0000 mg | MEDICATED_PATCH | Freq: Every day | TRANSDERMAL | 2 refills | Status: DC
  Filled 2023-01-22: qty 28, fill #0

## 2023-01-22 MED ORDER — NICOTINE 14 MG/24HR TD PT24
MEDICATED_PATCH | TRANSDERMAL | 0 refills | Status: DC
  Filled 2023-01-22: qty 28, 28d supply, fill #0
  Filled 2023-01-22: qty 28, fill #0

## 2023-01-22 NOTE — Telephone Encounter (Signed)
-----   Message from Levi Aland sent at 01/22/2023  4:55 PM EST ----- Patient was previously on Nexletol (or zet) but it was cost prohibitive. She did not tolerate Leqvio and refuses to try any other injectables.  Can we see if Nexletol would be a viable option for her now or if she qualifies for the health will grant.  She has multiple comorbidities.  Thank you, Marcelino Duster

## 2023-01-22 NOTE — Patient Instructions (Signed)
Medication Instructions:   START Nicotine patches: Start with 21 mg patch for 28 days.Start after completing 21 mg dose 14 mg for 28 days than Use this patch last the 7 mg patch.   *If you need a refill on your cardiac medications before your next appointment, please call your pharmacy*   Lab Work:  None ordered.  If you have labs (blood work) drawn today and your tests are completely normal, you will receive your results only by: MyChart Message (if you have MyChart) OR A paper copy in the mail If you have any lab test that is abnormal or we need to change your treatment, we will call you to review the results.   Testing/Procedures:  None ordered.   Follow-Up: At [facility], you and your health needs are our priority.  As part of our continuing mission to provide you with exceptional heart care, we have created designated Provider Care Teams.  These Care Teams include your primary Cardiologist (physician) and Advanced Practice Providers (APPs -  Physician Assistants and Nurse Practitioners) who all work together to provide you with the care you need, when you need it.  We recommend signing up for the patient portal called "MyChart".  Sign up information is provided on this After Visit Summary.  MyChart is used to connect with patients for Virtual Visits (Telemedicine).  Patients are able to view lab/test results, encounter notes, upcoming appointments, etc.  Non-urgent messages can be sent to your provider as well.   To learn more about what you can do with MyChart, go to [email or web address].    Your next appointment:   2 month(s)  Provider:   Donato Schultz, MD

## 2023-01-22 NOTE — Telephone Encounter (Signed)
Pharmacy Patient Advocate Encounter   Received notification from Physician's Office that prior authorization for nexletol is required/requested.   Insurance verification completed.   The patient is insured through [employer] .   Per test claim: The current 01/22/23 day co-pay is, $0.00 one month.  No PA needed at this time. This test claim was processed through [facility]- copay amounts may vary at other pharmacies due to pharmacy/plan contracts, or as the patient moves through the different stages of their insurance plan.

## 2023-01-23 ENCOUNTER — Other Ambulatory Visit: Payer: Self-pay | Admitting: *Deleted

## 2023-01-23 ENCOUNTER — Telehealth: Payer: Self-pay | Admitting: *Deleted

## 2023-01-23 ENCOUNTER — Other Ambulatory Visit (HOSPITAL_COMMUNITY): Payer: Self-pay

## 2023-01-23 DIAGNOSIS — E785 Hyperlipidemia, unspecified: Secondary | ICD-10-CM

## 2023-01-23 DIAGNOSIS — I2510 Atherosclerotic heart disease of native coronary artery without angina pectoris: Secondary | ICD-10-CM

## 2023-01-23 MED ORDER — NEXLETOL 180 MG PO TABS
180.0000 mg | ORAL_TABLET | Freq: Every day | ORAL | 3 refills | Status: DC
  Filled 2023-01-23: qty 90, 90d supply, fill #0

## 2023-01-23 NOTE — Telephone Encounter (Signed)
-----   Message from Levi Aland sent at 01/22/2023  4:07 PM EST ----- Please call her tomorrow to ask her to monitor home blood pressure in each arm for 2 weeks and then call us back to report.  Thank you, Marcelino Duster

## 2023-01-23 NOTE — Telephone Encounter (Signed)
S/w pt is aware to check bp in both arms for two weeks and send mychart message with readings. Pt is agreeable to plan.

## 2023-01-23 NOTE — Addendum Note (Signed)
Addended by: Levi Aland on: 01/23/2023 06:48 AM   Modules accepted: Orders

## 2023-01-23 NOTE — Telephone Encounter (Signed)
S/w pt is aware Nexletol has been approved, sent in today to requested pharmacy. Placed ordered for cmet/lipid and released. Mailed to pt to get fasting labs prior to March appt with Dr. Anne Fu.

## 2023-01-23 NOTE — Telephone Encounter (Signed)
Please let patient know that Nexletol has a $0 copay at [facility]. I recommend that she start this and get lipid panel and CMET a day or two before her appointment in March with Dr. Anne Fu.

## 2023-01-25 ENCOUNTER — Other Ambulatory Visit (HOSPITAL_COMMUNITY): Payer: Self-pay

## 2023-02-01 ENCOUNTER — Other Ambulatory Visit (HOSPITAL_COMMUNITY): Payer: Self-pay

## 2023-02-02 ENCOUNTER — Other Ambulatory Visit (HOSPITAL_COMMUNITY): Payer: Self-pay

## 2023-02-12 ENCOUNTER — Ambulatory Visit (INDEPENDENT_AMBULATORY_CARE_PROVIDER_SITE_OTHER): Payer: Medicaid - Out of State

## 2023-02-12 DIAGNOSIS — I5022 Chronic systolic (congestive) heart failure: Secondary | ICD-10-CM

## 2023-02-12 DIAGNOSIS — I255 Ischemic cardiomyopathy: Secondary | ICD-10-CM

## 2023-02-12 LAB — CUP PACEART REMOTE DEVICE CHECK
Battery Remaining Longevity: 12 mo
Battery Voltage: 2.88 V
Brady Statistic AP VP Percent: 0.45 %
Brady Statistic AP VS Percent: 0.02 %
Brady Statistic AS VP Percent: 99.37 %
Brady Statistic AS VS Percent: 0.15 %
Brady Statistic RA Percent Paced: 0.48 %
Brady Statistic RV Percent Paced: 99.23 %
Date Time Interrogation Session: 20250210023324
HighPow Impedance: 66 Ohm
Implantable Lead Connection Status: 753985
Implantable Lead Connection Status: 753985
Implantable Lead Connection Status: 753985
Implantable Lead Implant Date: 20190124
Implantable Lead Implant Date: 20190124
Implantable Lead Implant Date: 20190124
Implantable Lead Location: 753859
Implantable Lead Location: 753860
Implantable Lead Location: 753860
Implantable Lead Model: 3830
Implantable Lead Model: 5076
Implantable Lead Model: 6935
Implantable Pulse Generator Implant Date: 20190124
Lead Channel Impedance Value: 247 Ohm
Lead Channel Impedance Value: 266 Ohm
Lead Channel Impedance Value: 342 Ohm
Lead Channel Impedance Value: 399 Ohm
Lead Channel Impedance Value: 437 Ohm
Lead Channel Impedance Value: 456 Ohm
Lead Channel Pacing Threshold Amplitude: 0.5 V
Lead Channel Pacing Threshold Amplitude: 0.75 V
Lead Channel Pacing Threshold Amplitude: 1 V
Lead Channel Pacing Threshold Pulse Width: 0.4 ms
Lead Channel Pacing Threshold Pulse Width: 0.4 ms
Lead Channel Pacing Threshold Pulse Width: 0.5 ms
Lead Channel Sensing Intrinsic Amplitude: 1.75 mV
Lead Channel Sensing Intrinsic Amplitude: 1.75 mV
Lead Channel Sensing Intrinsic Amplitude: 9.5 mV
Lead Channel Sensing Intrinsic Amplitude: 9.5 mV
Lead Channel Setting Pacing Amplitude: 1.25 V
Lead Channel Setting Pacing Amplitude: 1.5 V
Lead Channel Setting Pacing Amplitude: 2.5 V
Lead Channel Setting Pacing Pulse Width: 0.4 ms
Lead Channel Setting Pacing Pulse Width: 0.5 ms
Lead Channel Setting Sensing Sensitivity: 0.3 mV
Zone Setting Status: 755011

## 2023-02-18 ENCOUNTER — Encounter: Payer: Self-pay | Admitting: Internal Medicine

## 2023-03-07 ENCOUNTER — Telehealth: Payer: Self-pay | Admitting: Internal Medicine

## 2023-03-07 NOTE — Telephone Encounter (Signed)
 LMOVM for pt to call [employer] tech support to get help with her monitor.

## 2023-03-07 NOTE — Telephone Encounter (Signed)
 Pt stated her device blinks yellow and blue at night and she'd like to know the reason for it because now she'd concerned and is requesting a callback. Please advise

## 2023-03-09 ENCOUNTER — Other Ambulatory Visit: Payer: Self-pay | Admitting: Internal Medicine

## 2023-03-10 ENCOUNTER — Other Ambulatory Visit: Payer: Self-pay | Admitting: Cardiovascular Disease

## 2023-03-12 NOTE — Telephone Encounter (Signed)
 Refill request

## 2023-03-20 ENCOUNTER — Emergency Department (HOSPITAL_COMMUNITY)

## 2023-03-20 ENCOUNTER — Encounter (HOSPITAL_COMMUNITY): Payer: Self-pay

## 2023-03-20 ENCOUNTER — Other Ambulatory Visit: Payer: Self-pay

## 2023-03-20 ENCOUNTER — Emergency Department (HOSPITAL_COMMUNITY)
Admission: EM | Admit: 2023-03-20 | Discharge: 2023-03-21 | Disposition: A | Discharge status: Home / Self Care | Attending: Emergency Medicine | Admitting: Emergency Medicine

## 2023-03-20 DIAGNOSIS — R079 Chest pain, unspecified: Secondary | ICD-10-CM

## 2023-03-20 DIAGNOSIS — I2510 Atherosclerotic heart disease of native coronary artery without angina pectoris: Secondary | ICD-10-CM | POA: Insufficient documentation

## 2023-03-20 DIAGNOSIS — I509 Heart failure, unspecified: Secondary | ICD-10-CM | POA: Diagnosis not present

## 2023-03-20 DIAGNOSIS — Z79899 Other long term (current) drug therapy: Secondary | ICD-10-CM | POA: Diagnosis not present

## 2023-03-20 DIAGNOSIS — Z7982 Long term (current) use of aspirin: Secondary | ICD-10-CM | POA: Diagnosis not present

## 2023-03-20 DIAGNOSIS — R0789 Other chest pain: Secondary | ICD-10-CM | POA: Diagnosis present

## 2023-03-20 DIAGNOSIS — I110 Hypertensive heart disease with heart failure: Secondary | ICD-10-CM | POA: Diagnosis not present

## 2023-03-20 DIAGNOSIS — E876 Hypokalemia: Secondary | ICD-10-CM | POA: Insufficient documentation

## 2023-03-20 LAB — BASIC METABOLIC PANEL
Anion gap: 12 (ref 5–15)
BUN: 15 mg/dL (ref 6–20)
CO2: 24 mmol/L (ref 22–32)
Calcium: 9.3 mg/dL (ref 8.9–10.3)
Chloride: 105 mmol/L (ref 98–111)
Creatinine, Ser: 0.96 mg/dL (ref 0.44–1.00)
GFR, Estimated: >60 mL/min (ref >60)
Glucose, Bld: 70 mg/dL (ref 70–99)
Potassium: 3.4 mmol/L — ABNORMAL LOW (ref 3.5–5.1)
Sodium: 141 mmol/L (ref 135–145)

## 2023-03-20 LAB — CBC
HCT: 49.7 % — ABNORMAL HIGH (ref 36.0–46.0)
Hemoglobin: 15.9 g/dL — ABNORMAL HIGH (ref 12.0–15.0)
MCH: 29 pg (ref 26.0–34.0)
MCHC: 32 g/dL (ref 30.0–36.0)
MCV: 90.7 fL (ref 80.0–100.0)
Platelets: 261 10*3/uL (ref 150–400)
RBC: 5.48 MIL/uL — ABNORMAL HIGH (ref 3.87–5.11)
RDW: 14.3 % (ref 11.5–15.5)
WBC: 9.2 10*3/uL (ref 4.0–10.5)
nRBC: 0 % (ref 0.0–0.2)

## 2023-03-20 LAB — HEPATIC FUNCTION PANEL
ALT: 17 U/L (ref 0–44)
AST: 30 U/L (ref 15–41)
Albumin: 4.1 g/dL (ref 3.5–5.0)
Alkaline Phosphatase: 81 U/L (ref 38–126)
Bilirubin, Direct: 0.1 mg/dL (ref 0.0–0.2)
Indirect Bilirubin: 0.7 mg/dL (ref 0.3–0.9)
Total Bilirubin: 0.8 mg/dL (ref 0.0–1.2)
Total Protein: 7.9 g/dL (ref 6.5–8.1)

## 2023-03-20 LAB — TROPONIN I (HIGH SENSITIVITY)
Troponin I (High Sensitivity): 11 ng/L (ref <18)
Troponin I (High Sensitivity): 14 ng/L (ref <18)

## 2023-03-20 LAB — LIPASE, BLOOD: Lipase: 33 U/L (ref 11–51)

## 2023-03-20 MED ORDER — IOHEXOL 350 MG/ML SOLN
100.0000 mL | Freq: Once | INTRAVENOUS | Status: AC | PRN
  Administered 2023-03-20: 100 mL via INTRAVENOUS

## 2023-03-20 MED ORDER — OXYCODONE HCL 5 MG PO TABS
5.0000 mg | ORAL_TABLET | Freq: Once | ORAL | Status: AC
  Administered 2023-03-20: 5 mg via ORAL
  Filled 2023-03-20: qty 1

## 2023-03-20 MED ORDER — FAMOTIDINE 20 MG PO TABS
20.0000 mg | ORAL_TABLET | Freq: Once | ORAL | Status: AC
  Administered 2023-03-20: 20 mg via ORAL
  Filled 2023-03-20: qty 1

## 2023-03-20 MED ORDER — ALUM & MAG HYDROXIDE-SIMETH 200-200-20 MG/5ML PO SUSP
30.0000 mL | Freq: Once | ORAL | Status: AC
  Administered 2023-03-20: 30 mL via ORAL
  Filled 2023-03-20: qty 30

## 2023-03-20 MED ORDER — ACETAMINOPHEN 325 MG PO TABS
ORAL_TABLET | ORAL | Status: AC
  Filled 2023-03-20: qty 2

## 2023-03-20 MED ORDER — ALUM & MAG HYDROXIDE-SIMETH 200-200-20 MG/5ML PO SUSP: 30.0000 mL | Freq: Once | ORAL | Status: DC

## 2023-03-20 MED ORDER — LIDOCAINE VISCOUS HCL 2 % MT SOLN
15.0000 mL | Freq: Once | OROMUCOSAL | Status: AC
  Administered 2023-03-20: 15 mL via ORAL
  Filled 2023-03-20: qty 15

## 2023-03-20 MED ORDER — FAMOTIDINE 20 MG PO TABS: 20.0000 mg | ORAL_TABLET | Freq: Once | ORAL | Status: DC

## 2023-03-20 MED ORDER — OXYCODONE-ACETAMINOPHEN 5-325 MG PO TABS: 1.0000 | ORAL_TABLET | Freq: Once | ORAL | Status: DC

## 2023-03-20 MED ORDER — ACETAMINOPHEN 325 MG PO TABS
650.0000 mg | ORAL_TABLET | Freq: Once | ORAL | Status: AC
  Administered 2023-03-20: 650 mg via ORAL
  Filled 2023-03-20: qty 2

## 2023-03-20 MED ORDER — LIDOCAINE VISCOUS HCL 2 % MT SOLN: 15.0000 mL | Freq: Once | OROMUCOSAL | Status: DC

## 2023-03-20 NOTE — ED Provider Triage Note (Signed)
 Emergency Medicine Provider Triage Evaluation Note  Lindsay Camacho , a 58 y.o. female  was evaluated in triage.  Pt complains of CP intermittently since yesterday. Hx Cad, HF, MI  Review of Systems  Positive: SHOB, dizziness, fatigued Negative: N/V/D, cough, congestion, fever, abd pain  Physical Exam  BP (!) 142/88 (BP Location: Right Arm)   Pulse 85   Resp 20   SpO2 100%  Gen:   Awake, no distress   Resp:  Normal effort, speaking in full sentences  MSK:   Moves extremities without difficulty  Other:  Pt smells of THC  Medical Decision Making  Medically screening exam initiated at 2:44 PM.  Appropriate orders placed.  [name] was informed that the remainder of the evaluation will be completed by another provider, this initial triage assessment does not replace that evaluation, and the importance of remaining in the ED until their evaluation is complete.  Labs and imaging ordered   Dolphus Jenny, PA-C 03/20/23 1447

## 2023-03-20 NOTE — ED Notes (Signed)
 Pt eloped.

## 2023-03-20 NOTE — ED Triage Notes (Addendum)
 Pt c/o left sided chest pain. Pt states has pain in arms bilat. Pt c/o pain in mid upper back, SOB started yesterday. Pt states her pacemaker monitor lit up a couple of weeks ago and couldn't get in touch with dr. Rock Nephew able to speak in compete sentences. Pt states she keeps feeling electrical shocks around her pacemaker and heart.

## 2023-03-20 NOTE — Discharge Instructions (Addendum)
 Your workup tonight was reassuring.  Please schedule a follow-up appointment with your cardiologist.  There were incidental findings on your CT scan.  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  They recommend continuing to use aspirin, your Lipitor, and to stop smoking.  If you develop any life-threatening symptoms please return to the emergency department.

## 2023-03-20 NOTE — ED Provider Notes (Signed)
 [facility] EMERGENCY DEPARTMENT AT [facility] Provider Note   CSN: [id number] Arrival date & time: 03/20/23  1359     History {Add pertinent medical, surgical, social history, OB history to HPI:1} Chief Complaint  Patient presents with   Chest Pain    Lindsay Camacho is a 58 y.o. female.   Chest Pain   58 year old female presents emergency department with complaints of chest pain.  Has been having left-sided chest pain since yesterday evening.  Patient states initially occurred when she was sitting still and then when she was laying in bed.  She reported radiation to the middle of her back.  Reports additional episode earlier today around 11 AM when she was walking and has been constant since onset.  Denies any associated shortness of breath.  The pain is worsened with certain movements.  Denies changes in pain with consumption of food/liquids.  Denies any fevers, chills, cough, congestion.  Denies abdominal pain, nausea, vomiting.  Was given Tylenol in triage area without improvement.  Patient also reports her pacemaker was blinking last night.  States she feels like it "may be going off."  Past medical history significant for nonischemic cardiomyopathy, stenosis of left subclavian artery with stent placement, MI, CAD, CHF, left bundle wrench block, mitral valve regurgitation, PVD, CVA, hypertension  Home Medications Prior to Admission medications   Medication Sig Start Date End Date Taking? Authorizing Provider  aspirin EC 81 MG tablet Take 81 mg by mouth daily.    [provider]  atorvastatin (LIPITOR) 80 MG tablet Take 1 tablet (80 mg total) by mouth daily. 09/07/20   Marinus Maw, MD  Bempedoic Acid (NEXLETOL) 180 MG TABS Take 1 tablet (180 mg total) by mouth daily. 01/23/23   Swinyer, Zachary George, NP  Bempedoic Acid (NEXLETOL) 180 MG TABS Take 1 tablet (180 mg total) by mouth daily. 01/23/23   Swinyer, Zachary George, NP  clopidogrel (PLAVIX) 75 MG tablet TAKE 1  TABLET BY MOUTH DAILY 03/09/23   Jake Bathe, MD  colchicine 0.6 MG tablet Take 1 tablet (0.6 mg total) by mouth 2 (two) times daily. 12/31/22   Azalee Course, PA  cyclobenzaprine (FLEXERIL) 10 MG tablet Take 1 tablet (10 mg total) by mouth 3 (three) times daily as needed for muscle spasms. 05/19/22   Roxy Horseman, PA-C  ergocalciferol (VITAMIN D2) 1.25 MG (50000 UT) capsule Take 1 capsule (50,000 Units total) by mouth every Friday. 05/26/22   Iran Ouch, MD  furosemide (LASIX) 40 MG tablet Take 1 tablet (40 mg total) by mouth daily as needed for fluid or edema (shortness of breath). 03/05/20   Graciella Freer, PA-C  ibuprofen (ADVIL) 600 MG tablet Take 1 tablet (600 mg total) by mouth every 6 (six) hours as needed. 05/19/22   Roxy Horseman, PA-C  losartan (COZAAR) 50 MG tablet Take 1 tablet (50 mg total) by mouth daily. 11/14/22   Iran Ouch, MD  Menthol-Methyl Salicylate (SALONPAS PAIN RELIEF PATCH EX) Apply 1 patch topically daily as needed (pain).    [provider]  metoprolol succinate (TOPROL-XL) 25 MG 24 hr tablet Take 1 tablet (25 mg total) by mouth daily. 01/22/23   Swinyer, Zachary George, NP  nicotine (NICODERM CQ - DOSED IN MG/24 HOURS) 21 mg/24hr patch Place 1 patch (21 mg total) onto the skin daily, for 28 days  (use this first) 01/22/23   Swinyer, Zachary George, NP  nicotine (NICODERM CQ - DOSED IN MG/24 HR) 7 mg/24hr  patch Place 1 patch (7 mg total) onto the skin daily. use this strength last 01/22/23   Swinyer, Zachary George, NP  nicotine (NICODERM CQ) 14 mg/24hr patch apply 1 patch daily for 28 days.  Start after completing 21 mg dose 01/22/23   Swinyer, Zachary George, NP  pantoprazole (PROTONIX) 40 MG tablet Take 1 tablet (40 mg total) by mouth daily. 02/07/22   Iran Ouch, MD  spironolactone (ALDACTONE) 25 MG tablet Take 1/2 (one-half) tablet by mouth once daily 03/12/23   Jake Bathe, MD      Allergies    Aldomet [methyldopa], Brilinta [ticagrelor], Other,  Coumadin [warfarin sodium], Desyrel [trazodone], Eliquis [apixaban], Entresto [sacubitril-valsartan], Zestril [lisinopril], and Penicillins    Review of Systems   Review of Systems  Cardiovascular:  Positive for chest pain.  All other systems reviewed and are negative.   Physical Exam Updated Vital Signs BP (!) 142/88 (BP Location: Right Arm)   Pulse 85   Temp (!) 97 F (36.1 C)   Resp 20   SpO2 100%  Physical Exam Vitals and nursing note reviewed.  Constitutional:      General: She is not in acute distress.    Appearance: She is well-developed.  HENT:     Head: Normocephalic and atraumatic.  Eyes:     Conjunctiva/sclera: Conjunctivae normal.  Cardiovascular:     Rate and Rhythm: Normal rate and regular rhythm.     Pulses: Normal pulses.     Heart sounds: No murmur heard. Pulmonary:     Effort: Pulmonary effort is normal. No respiratory distress.     Breath sounds: Normal breath sounds. No wheezing, rhonchi or rales.  Abdominal:     Palpations: Abdomen is soft.     Tenderness: There is no abdominal tenderness. There is no guarding.  Musculoskeletal:        General: No swelling.     Cervical back: Neck supple.     Comments: Tender to palpation left paraspinally in thoracic region.  Skin:    General: Skin is warm and dry.     Capillary Refill: Capillary refill takes less than 2 seconds.  Neurological:     Mental Status: She is alert.  Psychiatric:        Mood and Affect: Mood normal.     ED Results / Procedures / Treatments   Labs (all labs ordered are listed, but only abnormal results are displayed) Labs Reviewed  BASIC METABOLIC PANEL - Abnormal; Notable for the following components:      Result Value   Potassium 3.4 (*)    All other components within normal limits  CBC - Abnormal; Notable for the following components:   RBC 5.48 (*)    Hemoglobin 15.9 (*)    HCT 49.7 (*)    All other components within normal limits  URINALYSIS, ROUTINE W REFLEX  MICROSCOPIC  LIPASE, BLOOD  HEPATIC FUNCTION PANEL  TROPONIN I (HIGH SENSITIVITY)  TROPONIN I (HIGH SENSITIVITY)    EKG None  Radiology DG Chest 2 View Result Date: 03/20/2023 CLINICAL DATA:  Left-sided chest pain EXAM: CHEST - 2 VIEW COMPARISON:  02/04/2023 FINDINGS: The heart size and mediastinal contours are within normal limits. Prosthetic mitral valve again noted. AICD remains in place. Endoluminal stent again seen in the region of the proximal left common carotid or subclavian artery. Both lungs are clear. The visualized skeletal structures are unremarkable. IMPRESSION: No active cardiopulmonary disease. Electronically Signed   By: Danae Orleans M.D.   On:  03/20/2023 17:05    Procedures Procedures  {Document cardiac monitor, telemetry assessment procedure when appropriate:1}  Medications Ordered in ED Medications  oxyCODONE (Oxy IR/ROXICODONE) immediate release tablet 5 mg (has no administration in time range)  acetaminophen (TYLENOL) tablet 650 mg (650 mg Oral Given 03/20/23 1754)    ED Course/ Medical Decision Making/ A&P Clinical Course as of 03/20/23 2024  Tue Mar 20, 2023  2005 ED EKG [CR]    Clinical Course User Index [CR] Peter Garter, PA   {   Click here for ABCD2, HEART and other calculatorsREFRESH Note before signing :1}                              Medical Decision Making Amount and/or Complexity of Data Reviewed Labs: ordered. Radiology: ordered. ECG/medicine tests:  Decision-making details documented in ED Course.  Risk OTC drugs. Prescription drug management.   This patient presents to the ED for concern of chest pain, back pain, this involves an extensive number of treatment options, and is a complaint that carries with it a high risk of complications and morbidity.  The differential diagnosis includes ACS, PE, pneumothorax, aortic dissection, GERD, esophagitis, pacemaker malfunction, other   Co morbidities that complicate the patient  evaluation  See HPI   Additional history obtained:  Additional history obtained from EMR External records from outside source obtained and reviewed including hospital records   Lab Tests:  I Ordered, and personally interpreted labs.  The pertinent results include: No leukocytosis.  Polycythemia hemoglobin of 15.9.  Platelets within normal range.  Mild hypokalemia 3.4 but otherwise, electrolytes within normal limits.  No renal dysfunction.  Initial 0.11 with repeat 14.   Imaging Studies ordered:  I ordered imaging studies including chest x-ray, CT angio chest I independently visualized and interpreted imaging which showed  Chest x-ray: no acute cardiopulmonary abnormalities CT anterior chest dissection: Normal caliber thoracic and abdominal aorta.  Atherosclerosis I agree with the radiologist interpretation  Normal caliber of the thoracic and abdominal aorta. Severe mixed  atherosclerosis with a large burden of mural thrombus particularly  in the abdomen. No evidence of aneurysm, dissection, or other acute  aortic pathology.  2. Patent left subclavian artery stent.  3. Diffuse bilateral bronchial wall thickening. Background of fine  centrilobular nodularity, most concentrated in the lung apices, most  commonly seen in smoking-related respiratory bronchiolitis.    Aortic Atherosclerosis (ICD10-I70.0).    Cardiac Monitoring: / EKG:  The patient was maintained on a cardiac monitor.  I personally viewed and interpreted the cardiac monitored which showed an underlying rhythm of: ***   Consultations Obtained:  I requested consultation with the ***,  and discussed lab and imaging findings as well as pertinent plan - they recommend: ***   Problem List / ED Course / Critical interventions / Medication management  *** I ordered medication including ***  for ***  Reevaluation of the patient after these medicines showed that the patient  {resolved/improved/worsened:23923::"improved"} I have reviewed the patients home medicines and have made adjustments as needed   Social Determinants of Health:  ***   Test / Admission - Considered:  Vitals signs significant for ***. Otherwise within normal range and stable throughout visit. Laboratory/imaging studies significant for: *** *** Worrisome signs and symptoms were discussed with the patient, and the patient acknowledged understanding to return to the ED if noticed. Patient was stable upon discharge.    {Document critical care time when  appropriate:1} {Document review of labs and clinical decision tools ie heart score, Chads2Vasc2 etc:1}  {Document your independent review of radiology images, and any outside records:1} {Document your discussion with family members, caretakers, and with consultants:1} {Document social determinants of health affecting pt's care:1} {Document your decision making why or why not admission, treatments were needed:1} Final Clinical Impression(s) / ED Diagnoses Final diagnoses:  None    Rx / DC Orders ED Discharge Orders     None

## 2023-03-21 NOTE — Progress Notes (Signed)
 Remote ICD transmission.

## 2023-03-21 NOTE — ED Provider Notes (Signed)
  Physical Exam  BP (!) 170/87   Pulse 78   Temp (!) 97.1 F (36.2 C) (Oral)   Resp 19   SpO2 99%   Physical Exam  Procedures  Procedures  ED Course / MDM   Clinical Course as of 03/21/23 0141  Tue Mar 20, 2023  2241 Consulted Dr. Randie Heinz vascular surgery regarding patient's CT dissection study with atherosclerosis with mural thrombus present.  Appearance is more chronic in nature.  Recommend continued use of aspirin, statin and cessation of smoking. [CR]    Clinical Course User Index [CR] Peter Garter, PA   Medical Decision Making Amount and/or Complexity of Data Reviewed Labs: ordered. Radiology: ordered.  Risk OTC drugs. Prescription drug management.   Patient care assumed at shift handoff from [facility], [place].  See his note for full details.  In short, 58 year old female patient presented to the emergency room with chest pain which began last night.  She reported radiation to the middle of her back.  She denies associated shortness of breath.  Pain worse with certain movements.  Feels that the pacemaker she had implanted in 2019 may be firing.  At time of shift handoff workup has been grossly benign with negative troponins, CT angio chest abdomen pelvis dissection study with incidental findings discussed with vascular surgery.  Plan to follow-up on pacemaker interrogation.  If pacemaker interrogation shows overall benign report, patient likely able to discharge home.  Pacemaker showing no recent firings, no episodes of V. tach, V-fib, atrial tach or other significant dysrhythmias.  Patient is eager to discharge home.  At this time feel comfortable discharging patient home with recommendations for continued use of aspirin, statin, and cessation of smoking and follow-up with cardiology.       Lindsay Camacho 03/21/23 0141    Lindsay Memos, MD 03/21/23 [id number]

## 2023-03-30 ENCOUNTER — Encounter: Payer: Self-pay | Admitting: Cardiology

## 2023-03-30 ENCOUNTER — Ambulatory Visit: Payer: Medicare HMO | Attending: Cardiology | Admitting: Cardiology

## 2023-03-30 VITALS — BP 132/90 | HR 83 | Ht 61.5 in | Wt 113.8 lb

## 2023-03-30 DIAGNOSIS — I5022 Chronic systolic (congestive) heart failure: Secondary | ICD-10-CM

## 2023-03-30 DIAGNOSIS — Z95810 Presence of automatic (implantable) cardiac defibrillator: Secondary | ICD-10-CM

## 2023-03-30 DIAGNOSIS — Z720 Tobacco use: Secondary | ICD-10-CM

## 2023-03-30 DIAGNOSIS — I2510 Atherosclerotic heart disease of native coronary artery without angina pectoris: Secondary | ICD-10-CM

## 2023-03-30 DIAGNOSIS — I447 Left bundle-branch block, unspecified: Secondary | ICD-10-CM | POA: Diagnosis not present

## 2023-03-30 DIAGNOSIS — I255 Ischemic cardiomyopathy: Secondary | ICD-10-CM | POA: Diagnosis not present

## 2023-03-30 NOTE — Patient Instructions (Signed)
 Medication Instructions:  The current medical regimen is effective;  continue present plan and medications.  *If you need a refill on your cardiac medications before your next appointment, please call your pharmacy*  Follow-Up: At [facility], you and your health needs are our priority.  As part of our continuing mission to provide you with exceptional heart care, our providers are all part of one team.  This team includes your primary Cardiologist (physician) and Advanced Practice Providers or APPs (Physician Assistants and Nurse Practitioners) who all work together to provide you with the care you need, when you need it.  Your next appointment:   6 month(s)  Provider:   Eligha Bridegroom, NP   And 1 year with Dr Anne Fu  We recommend signing up for the patient portal called "MyChart".  Sign up information is provided on this After Visit Summary.  MyChart is used to connect with patients for Virtual Visits (Telemedicine).  Patients are able to view lab/test results, encounter notes, upcoming appointments, etc.  Non-urgent messages can be sent to your provider as well.   To learn more about what you can do with MyChart, go to [email or web address].      1st Floor: - Lobby - Registration  - Pharmacy  - Lab - Cafe  2nd Floor: - PV Lab - Diagnostic Testing (echo, CT, nuclear med)  3rd Floor: - Vacant  4th Floor: - TCTS (cardiothoracic surgery) - AFib Clinic - Structural Heart Clinic - Vascular Surgery  - Vascular Ultrasound  5th Floor: - HeartCare Cardiology (general and EP) - Clinical Pharmacy for coumadin, hypertension, lipid, weight-loss medications, and med management appointments    Valet parking services will be available as well.

## 2023-03-30 NOTE — Progress Notes (Signed)
 Cardiology Office Note:  .   Date:  03/30/2023  ID:  Lindsay Camacho, DOB May 10, 1965, MRN [id number] PCP: Marrianne Mood, MD  [facility] Providers Cardiologist:  Donato Schultz, MD Electrophysiologist:  Lewayne Bunting, MD  PV Cardiologist:  Lorine Bears, MD     History of Present Illness: .   Lindsay Camacho is a 58 y.o. female Discussed the use of AI scribe software for clinical note transcription with the patient, who gave verbal consent to proceed.  History of Present Illness Lindsay Camacho is a 58 year old female with coronary artery disease and chronic systolic heart failure who presents for follow-up after recent chest pain.  She experienced significant chest and back pain, prompting an emergency room visit on March 20, 2023. The pain originated in her back and radiated to her front, described as severe and reminiscent of a heart attack, although no myocardial infarction was confirmed during the ER visit.  Her medical history includes coronary artery disease with a patent left circumflex stent placed before a repeat catheterization in August 2018, which showed minimal non-prescriptive disease elsewhere. She has undergone mitral valve repair and has a known allergy to nuclear dye used in stress tests, necessitating the use of alternative medications for such procedures. She is scheduled for a carotid Doppler ultrasound next month to assess for any blockages.  She has chronic systolic heart failure with an ejection fraction that was 25% in 2019, improving to 55% by 2024. An implantable cardioverter-defibrillator was placed in 2019 due to a left bundle branch block.  She previously discontinued Nexletol due to cost and has stopped taking cholesterol medication due to adverse effects. Recent blood work showed an LDL level of 79 to 108 mg/dL.  She smokes and has been smoking since she was young, influenced by her older siblings. She finds it challenging to quit smoking.    Studies  Reviewed: .        Results LABS Hb: Normal Troponins: Normal LDL: 79 or 108  DIAGNOSTIC Cardiac catheterization: Patent left circumflex stent, minimal non-obstructive disease elsewhere (08/2016) Echocardiogram: EF 55% (2024) EKG: Normal (03/20/2023) Risk Assessment/Calculations:           Physical Exam:   VS:  BP (!) 132/90   Pulse 83   Ht 5' 1.5" (1.562 m)   Wt 113 lb 12.8 oz (51.6 kg)   SpO2 98%   BMI 21.15 kg/m    Wt Readings from Last 3 Encounters:  03/30/23 113 lb 12.8 oz (51.6 kg)  01/22/23 110 lb (49.9 kg)  12/31/22 104 lb 4.8 oz (47.3 kg)    GEN: Well nourished, well developed in no acute distress NECK: No JVD; No carotid bruits CARDIAC: RRR, no murmurs, no rubs, no gallops RESPIRATORY:  Clear to auscultation without rales, wheezing or rhonchi  ABDOMEN: Soft, non-tender, non-distended EXTREMITIES:  No edema; No deformity   ASSESSMENT AND PLAN: .    Assessment and Plan Assessment & Plan Chest Pain Recurrent chest pain on March 20, 2023, with associated back pain. Previous echocardiogram showed good cardiac function. Normal troponins exclude myocardial infarction. Differential diagnosis includes non-cardiac causes due to absence of acute coronary syndrome. Allergy to nuclear dye limits stress test options. - Consider stress test for cardiac evaluation, avoiding nuclear dye due to allergy - Review 2018 heart catheterization showing minimal coronary disease and patent CX stent  Coronary Artery Disease Coronary artery disease with a stent in the left circumflex artery. 2018 catheterization showed minimal disease. Scheduled for  carotid Doppler ultrasound to assess for blockages. - Plan for carotid Doppler ultrasound  Chronic Systolic Heart Failure Chronic systolic heart failure with EF improved from 25% in 2019 to 55% in 2024. ICD placed in 2019 for left bundle branch block, functioning well and improving quality of life. - Monitor heart function and ICD  status - Check ICD battery status, replacement anticipated in one year  Hyperlipidemia Discontinued Nexletol for hyperlipidemia due to cost. Current LDL levels are 79 and 108, not optimal but not severely elevated. - Monitor lipid levels and consider alternative lipid-lowering strategies  Tobacco Use Disorder Long-standing tobacco use with difficulty in cessation. Acknowledged challenge of quitting. - Encourage smoking cessation and provide resources for support  Follow-up Follow-up care coordination with healthcare team. - Schedule follow-up in six months with Eligha Bridegroom at [employer] [facility], Donato Schultz, MD

## 2023-04-09 ENCOUNTER — Telehealth: Payer: Self-pay | Admitting: Cardiology

## 2023-04-09 DIAGNOSIS — I771 Stricture of artery: Secondary | ICD-10-CM

## 2023-04-09 NOTE — Telephone Encounter (Signed)
 Pt called in stating she is unable to come to Carotid test tomorrow, next available is 4/28. Order expires 4/9, can a new order be placed please.

## 2023-04-09 NOTE — Telephone Encounter (Signed)
 Carotid testing had been ordered by Dr Kirke Corin.  Will reorder for him as requested.

## 2023-04-10 ENCOUNTER — Ambulatory Visit (HOSPITAL_COMMUNITY)
Admission: RE | Admit: 2023-04-10 | Source: Ambulatory Visit | Attending: Cardiovascular Disease | Admitting: Cardiovascular Disease

## 2023-04-30 ENCOUNTER — Ambulatory Visit (HOSPITAL_COMMUNITY)
Admission: RE | Admit: 2023-04-30 | Discharge: 2023-04-30 | Disposition: A | Discharge status: Home / Self Care | Source: Ambulatory Visit | Attending: Cardiovascular Disease | Admitting: Cardiovascular Disease

## 2023-04-30 DIAGNOSIS — I771 Stricture of artery: Secondary | ICD-10-CM | POA: Diagnosis present

## 2023-05-03 ENCOUNTER — Telehealth: Payer: Self-pay | Admitting: Cardiovascular Disease

## 2023-05-03 NOTE — Telephone Encounter (Signed)
 Left message for patient to call back

## 2023-05-03 NOTE — Telephone Encounter (Signed)
 Follow Up:      Patient says she would like her

## 2023-05-03 NOTE — Telephone Encounter (Signed)
 Follow Up:     She would like her Ultrasound results from 04-30-23 please

## 2023-05-04 NOTE — Telephone Encounter (Signed)
 Left a message for the patient to call back.

## 2023-05-04 NOTE — Telephone Encounter (Signed)
 See result note.

## 2023-05-04 NOTE — Telephone Encounter (Signed)
 Left a message for the patient to call back.  Per Dr. Alvenia Aus, Possible read stenosis in the left subclavian artery.  However, the blood pressure in the left arm seems to be equal to the right.  Have her follow-up with me in few weeks to evaluate.

## 2023-05-07 NOTE — Telephone Encounter (Signed)
 I spoke with the pt and advised her the information per Dr Alvenia Aus... she will f/u 05/22/23 in the [place] office. Pt aware of the location and says it is convenient for her.

## 2023-05-07 NOTE — Telephone Encounter (Signed)
 Pt returning call, requesting cb

## 2023-05-07 NOTE — Telephone Encounter (Signed)
 Pt returning call

## 2023-05-07 NOTE — Telephone Encounter (Signed)
I left a message for the pt to call  back

## 2023-05-14 ENCOUNTER — Ambulatory Visit (INDEPENDENT_AMBULATORY_CARE_PROVIDER_SITE_OTHER): Payer: Medicaid - Out of State

## 2023-05-14 DIAGNOSIS — I255 Ischemic cardiomyopathy: Secondary | ICD-10-CM | POA: Diagnosis not present

## 2023-05-14 DIAGNOSIS — I5022 Chronic systolic (congestive) heart failure: Secondary | ICD-10-CM

## 2023-05-17 LAB — CUP PACEART REMOTE DEVICE CHECK
Battery Remaining Longevity: 11 mo
Battery Voltage: 2.86 V
Brady Statistic AP VP Percent: 0.18 %
Brady Statistic AP VS Percent: 0.01 %
Brady Statistic AS VP Percent: 99.51 %
Brady Statistic AS VS Percent: 0.3 %
Brady Statistic RA Percent Paced: 0.19 %
Brady Statistic RV Percent Paced: 98.61 %
Date Time Interrogation Session: 20250515070401
HighPow Impedance: 80 Ohm
Implantable Lead Connection Status: 753985
Implantable Lead Connection Status: 753985
Implantable Lead Connection Status: 753985
Implantable Lead Implant Date: 20190124
Implantable Lead Implant Date: 20190124
Implantable Lead Implant Date: 20190124
Implantable Lead Location: 753859
Implantable Lead Location: 753860
Implantable Lead Location: 753860
Implantable Lead Model: 3830
Implantable Lead Model: 5076
Implantable Lead Model: 6935
Implantable Pulse Generator Implant Date: 20190124
Lead Channel Impedance Value: 209 Ohm
Lead Channel Impedance Value: 266 Ohm
Lead Channel Impedance Value: 399 Ohm
Lead Channel Impedance Value: 399 Ohm
Lead Channel Impedance Value: 437 Ohm
Lead Channel Impedance Value: 456 Ohm
Lead Channel Pacing Threshold Amplitude: 0.5 V
Lead Channel Pacing Threshold Amplitude: 0.875 V
Lead Channel Pacing Threshold Amplitude: 0.875 V
Lead Channel Pacing Threshold Pulse Width: 0.4 ms
Lead Channel Pacing Threshold Pulse Width: 0.4 ms
Lead Channel Pacing Threshold Pulse Width: 0.5 ms
Lead Channel Sensing Intrinsic Amplitude: 2.25 mV
Lead Channel Sensing Intrinsic Amplitude: 2.25 mV
Lead Channel Sensing Intrinsic Amplitude: 8.125 mV
Lead Channel Sensing Intrinsic Amplitude: 8.125 mV
Lead Channel Setting Pacing Amplitude: 1.5 V
Lead Channel Setting Pacing Amplitude: 1.5 V
Lead Channel Setting Pacing Amplitude: 2.5 V
Lead Channel Setting Pacing Pulse Width: 0.4 ms
Lead Channel Setting Pacing Pulse Width: 0.5 ms
Lead Channel Setting Sensing Sensitivity: 0.3 mV
Zone Setting Status: 755011

## 2023-05-21 ENCOUNTER — Ambulatory Visit: Payer: Self-pay | Admitting: Internal Medicine

## 2023-05-22 ENCOUNTER — Encounter: Payer: Self-pay | Admitting: Cardiovascular Disease

## 2023-05-22 ENCOUNTER — Ambulatory Visit: Attending: Cardiovascular Disease | Admitting: Cardiovascular Disease

## 2023-05-22 VITALS — BP 120/81 | Ht 61.0 in | Wt 107.0 lb

## 2023-05-22 DIAGNOSIS — E785 Hyperlipidemia, unspecified: Secondary | ICD-10-CM

## 2023-05-22 DIAGNOSIS — I5022 Chronic systolic (congestive) heart failure: Secondary | ICD-10-CM

## 2023-05-22 DIAGNOSIS — Z720 Tobacco use: Secondary | ICD-10-CM

## 2023-05-22 DIAGNOSIS — I2510 Atherosclerotic heart disease of native coronary artery without angina pectoris: Secondary | ICD-10-CM | POA: Diagnosis not present

## 2023-05-22 DIAGNOSIS — I771 Stricture of artery: Secondary | ICD-10-CM

## 2023-05-22 DIAGNOSIS — Z9889 Other specified postprocedural states: Secondary | ICD-10-CM

## 2023-05-22 MED ORDER — METOPROLOL SUCCINATE ER 50 MG PO TB24: 50.0000 mg | ORAL_TABLET | Freq: Every day | ORAL | 1 refills | Status: DC

## 2023-05-22 MED ORDER — LOSARTAN POTASSIUM 25 MG PO TABS
25.0000 mg | ORAL_TABLET | Freq: Every day | ORAL | 1 refills | Status: DC
Start: 2023-05-22 — End: 2023-06-11

## 2023-05-22 MED ORDER — VENTOLIN HFA 108 (90 BASE) MCG/ACT IN AERS: 1.0000 | INHALATION_SPRAY | RESPIRATORY_TRACT | 0 refills | Status: AC | PRN

## 2023-05-22 NOTE — Progress Notes (Signed)
 Cardiology Office Note   Date:  05/22/2023   ID:  Lindsay, Camacho September 02, 1965, MRN [id number]  PCP:  Adria Hopkins, MD  Cardiologist:  Dr. Renna Cary  Chief Complaint  Patient presents with   Follow-up    Racing of the heart , and her hands has had sensation and they stop, medication reviewed verbally with patient  Elvated BP on this past Sunday       History of Present Illness: Lindsay Camacho is a 58 y.o. female who is here today for follow-up visit regarding left subclavian artery stenosis status post stenting in September 2023. She has known history of coronary artery disease status post lateral MI in 2015 treated with PCI to the left circumflex.  Cardiac catheterization was complicated by retroperitoneal bleed requiring vascular surgery.  She has known history of chronic systolic heart failure and severe mitral regurgitation status post mitral valve repair in 2018.  There is prolonged history of tobacco use.  She is status post an ICD placement. She is followed for left subclavian stenosis status post stenting. 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  I performed successful angioplasty and balloon expandable stent placement to the left subclavian artery.   She was hospitalized in March, 2024 with a stroke thought to be due to small vessel disease.  CTA showed widely patent left subclavian and stent with no obstructive disease in the carotid arteries.  Echocardiogram showed an EF of 55 to 60% with mild pulmonary hypertension and normal functioning mitral valve prosthesis.  She had recent carotid Doppler which showed possible restenosis in the left subclavian artery stents with peak velocity of 500.  However, blood pressure in the left arm was almost equal to the right arm.  She has occasional weakness in her left arm but no convincing evidence of arm claudication.  She reports recent episodes of  palpitations and tachycardia.  Past Medical History:  Diagnosis Date   Acute myocardial infarction of other lateral wall, initial episode of care 01/2013   DES CFX   Aortoiliac occlusive disease (HCC) 08/22/2016   CAD (coronary artery disease) 11/13/2011   50% ? proximal LCx stenosis per Cath at [facility]   Chest pain, atypical, muscular skelatal   01/09/2014   COVID 02/08/2020   Dyspnea    Heart failure (HCC)    HLD (hyperlipidemia)    Hypertension    Intermediate coronary syndrome (HCC)    LBBB (left bundle branch block)    Mitral regurgitation    Nonischemic cardiomyopathy (HCC) 01/26/2017   Presence of combination internal cardiac defibrillator (ICD) and pacemaker    PVD (peripheral vascular disease) (HCC)    Right CFA stenosis per report on 11/14/2011   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/25/2016   Edwards McArthy-Adams IMR ETLogix ring annuloplasty (Model 4100, Serial [id number], size 26) placed via right mini thoracotomy approach   Stenosis of left subclavian artery (HCC) 08/22/2016   Stroke (HCC)    tia with no deficits    Past Surgical History:  Procedure Laterality Date   AORTIC ARCH ANGIOGRAPHY N/A 09/21/2021   Procedure: AORTIC ARCH ANGIOGRAPHY;  Surgeon: Wenona Hamilton, MD;  Location: [facility] INVASIVE CV LAB;  Service: Cardiovascular;  Laterality: N/A;   APPENDECTOMY     BIV ICD INSERTION CRT-D N/A 01/24/2017   Procedure: BIV ICD INSERTION CRT-D;  Surgeon: Tammie Fall, MD;  Location: [facility] INVASIVE CV LAB;  Service: Cardiovascular;  Laterality: N/A;   CARDIAC CATHETERIZATION  04/14/2013   Non-obstructive disease, patent CFX stent   CARDIAC CATHETERIZATION N/A 09/21/2014   Procedure: Left Heart Cath and Coronary Angiography;  Surgeon: Millicent Ally, MD;  Location: [facility] INVASIVE CV LAB;  Service: Cardiovascular;  Laterality: N/A;   CORONARY ANGIOPLASTY WITH STENT PLACEMENT  01/17/2013   mild disease except 99% CFX, rx with  2.5 x 28 Alpine drug-eluting stent    GROIN  DEBRIDEMENT Right 04/14/2013   Procedure: Emergency Evacuation of Retroperitoneal Hematoma and Repair Right External Iliac Artery Pseudoaneurysm    ;  Surgeon: Richrd Char, MD;  Location: [facility] OR;  Service: Vascular;  Laterality: Right;   LEFT HEART CATHETERIZATION WITH CORONARY ANGIOGRAM N/A 01/18/2013   Procedure: LEFT HEART CATHETERIZATION WITH CORONARY ANGIOGRAM;  Surgeon: Lucendia Rusk, MD;  Location: [facility] CATH LAB;  Service: Cardiovascular;  Laterality: N/A;   LEFT HEART CATHETERIZATION WITH CORONARY ANGIOGRAM N/A 04/14/2013   Procedure: LEFT HEART CATHETERIZATION WITH CORONARY ANGIOGRAM;  Surgeon: Lucendia Rusk, MD;  Location: [facility] CATH LAB;  Service: Cardiovascular;  Laterality: N/A;   MITRAL VALVE REPAIR Right 08/25/2016   Procedure: MINIMALLY INVASIVE MITRAL VALVE REPAIR;  Surgeon: Gardenia Jump, MD;  Location: [facility] OR;  Service: Open Heart Surgery;  Laterality: Right;   MULTIPLE EXTRACTIONS WITH ALVEOLOPLASTY N/A 08/23/2016   Procedure: Extraction of tooth #'s 17, 20-23, 26-29 with alveoloplasty;  Surgeon: Carol Chroman, DDS;  Location: [facility] OR;  Service: Oral Surgery;  Laterality: N/A;   PERCUTANEOUS CORONARY STENT INTERVENTION (PCI-S)  01/18/2013   Procedure: PERCUTANEOUS CORONARY STENT INTERVENTION (PCI-S);  Surgeon: Lucendia Rusk, MD;  Location: [facility] CATH LAB;  Service: Cardiovascular;;   RIGHT/LEFT HEART CATH AND CORONARY ANGIOGRAPHY N/A 08/21/2016   Procedure: RIGHT/LEFT HEART CATH AND CORONARY ANGIOGRAPHY;  Surgeon: Odie Benne, MD;  Location: [facility] INVASIVE CV LAB;  Service: Cardiovascular;  Laterality: N/A;   TEE WITHOUT CARDIOVERSION N/A 03/06/2014   Procedure: TRANSESOPHAGEAL ECHOCARDIOGRAM (TEE);  Surgeon: Liza Riggers, MD;  Location: [facility] ENDOSCOPY;  Service: Cardiovascular;  Laterality: N/A;   TEE WITHOUT CARDIOVERSION N/A 08/18/2016   Procedure: TRANSESOPHAGEAL ECHOCARDIOGRAM (TEE);  Surgeon: Lenise Quince, MD;  Location: [facility] ENDOSCOPY;  Service:  Cardiovascular;  Laterality: N/A;   TEE WITHOUT CARDIOVERSION N/A 08/25/2016   Procedure: TRANSESOPHAGEAL ECHOCARDIOGRAM (TEE);  Surgeon: Gardenia Jump, MD;  Location: [facility] OR;  Service: Open Heart Surgery;  Laterality: N/A;   TUBAL LIGATION     UPPER EXTREMITY INTERVENTION  09/21/2021   Procedure: UPPER EXTREMITY INTERVENTION;  Surgeon: Wenona Hamilton, MD;  Location: [facility] INVASIVE CV LAB;  Service: Cardiovascular;;     Current Outpatient Medications  Medication Sig Dispense Refill   aspirin  EC 81 MG tablet Take 81 mg by mouth daily.     atorvastatin  (LIPITOR ) 80 MG tablet Take 1 tablet (80 mg total) by mouth daily. 90 tablet 3   Bempedoic Acid  (NEXLETOL ) 180 MG TABS Take 1 tablet (180 mg total) by mouth daily. 90 tablet 3   clopidogrel  (PLAVIX ) 75 MG tablet TAKE 1 TABLET BY MOUTH DAILY 90 tablet 3   colchicine  0.6 MG tablet Take 1 tablet (0.6 mg total) by mouth 2 (two) times daily. 60 tablet 2   cyclobenzaprine  (FLEXERIL ) 10 MG tablet Take 1 tablet (10 mg total) by mouth 3 (three) times daily as needed for muscle spasms. 10 tablet 0   ergocalciferol  (VITAMIN D2) 1.25 MG (50000 UT) capsule Take 1 capsule (50,000 Units total) by mouth every Friday.  4 capsule 6   furosemide  (LASIX ) 40 MG tablet Take 1 tablet (40 mg total) by mouth daily as needed for fluid or edema (shortness of breath). 90 tablet 3   losartan  (COZAAR ) 50 MG tablet Take 1 tablet (50 mg total) by mouth daily. 90 tablet 1   meloxicam (MOBIC) 15 MG tablet Take 15 mg by mouth as needed for pain.     Menthol-Methyl Salicylate (SALONPAS PAIN RELIEF PATCH EX) Apply 1 patch topically daily as needed (pain).     metoprolol  succinate (TOPROL -XL) 25 MG 24 hr tablet Take 1 tablet (25 mg total) by mouth daily. 90 tablet 3   pantoprazole  (PROTONIX ) 40 MG tablet Take 1 tablet (40 mg total) by mouth daily. 90 tablet 3   spironolactone  (ALDACTONE ) 25 MG tablet Take 1/2 (one-half) tablet by mouth once daily 45 tablet 3   VENTOLIN  HFA 108 (90  Base) MCG/ACT inhaler 2 puffs Inhalation every 4 hrs PRN     No current facility-administered medications for this visit.    Allergies:   Aldomet [methyldopa], Brilinta  [ticagrelor ], Other, Coumadin  [warfarin sodium ], Desyrel [trazodone], Eliquis [apixaban], Entresto  [sacubitril -valsartan ], Zestril  [lisinopril ], and Penicillins    Social History:  The patient  reports that she has been smoking cigarettes. She has never used smokeless tobacco. She reports that she does not currently use drugs after having used the following drugs: Marijuana. She reports that she does not drink alcohol .   Family History:  The patient's family history includes Bladder Cancer in her mother; CAD (age of onset: 59) in her mother; CAD (age of onset: 98) in her father; Heart attack in her father and mother; Heart disease in her father and mother; Hypertension in her mother; Lung cancer in her mother; Stroke in her father and mother.    ROS:  Please see the history of present illness.   Otherwise, review of systems are positive for none.   All other systems are reviewed and negative.    PHYSICAL EXAM: VS:  BP 120/81 (BP Location: Right Arm, Patient Position: Sitting, Cuff Size: Normal)   Ht 5\' 1"  (1.549 m)   Wt 107 lb (48.5 kg)   BMI 20.22 kg/m  , BMI Body mass index is 20.22 kg/m. GEN: Well nourished, well developed, in no acute distress  HEENT: normal  Neck: no JVD,  or masses.  Right carotid bruit.  There is a bruit at the left subclavian artery area. Cardiac: RRR; no rubs, or gallops,no edema .  1/ oh6 systolic murmur at the base. Respiratory:  clear to auscultation bilaterally, normal work of breathing GI: soft, nontender, nondistended, + BS MS: no deformity or atrophy  Skin: warm and dry, no rash Neuro:  Strength and sensation are intact Psych: euthymic mood, full affect Vascular: Radial pulses +2 on both sides    EKG:  EKG is not ordered today.  Recent Labs: 10/25/2022: Magnesium  1.7 03/20/2023:  ALT 17; BUN 15; Creatinine, Ser 0.96; Hemoglobin 15.9; Platelets 261; Potassium 3.4; Sodium 141    Lipid Panel    Component Value Date/Time   CHOL 172 12/29/2022 1852   CHOL 132 03/07/2019 0923   TRIG 55 12/29/2022 1852   HDL 53 12/29/2022 1852   HDL 40 03/07/2019 0923   CHOLHDL 3.2 12/29/2022 1852   VLDL 11 12/29/2022 1852   LDLCALC 108 (H) 12/29/2022 1852   LDLCALC 79 03/07/2019 0923      Wt Readings from Last 3 Encounters:  05/22/23 107 lb (48.5 kg)  03/30/23 113 lb 12.8  oz (51.6 kg)  01/22/23 110 lb (49.9 kg)           No data to display            ASSESSMENT AND PLAN:  1.  Left subclavian artery occlusion status post angioplasty and stent placement in September 2023: There was evidence of restenosis by Doppler.  However, her pulses are equal in both arms.  No convincing evidence of arm claudication.  Will continue to monitor this clinically for now.  2.  Coronary artery disease involving native coronary arteries with other forms of angina angina: She seems to be stable overall.  She does report recent episodes of palpitations and tachycardia and thus we will increase Toprol  to 50 mg daily and decrease losartan  to 25 mg daily.  3.  Chronic systolic heart failure: She appears to be euvolemic.  Continue losartan  and carvedilol .    4.  Status post mitral valve repair: Stable.  5.  Tobacco use: I again discussed with her the importance of smoking cessation.  6.  Hyperlipidemia: She is on high-dose atorvastatin  and Nexletol ..  She received 1 dose of inclisiran but she does not want to take any further dosages of this due to lower extremity numbness.  7.  Bilateral leg pain: Lower extremity arterial Doppler showed normal ABI bilaterally.    Disposition:   follow up in 6 months  Signed,  Antionette Kirks, MD  05/22/2023 4:32 PM    [facility]

## 2023-05-22 NOTE — Patient Instructions (Signed)
 Medication Instructions:  Your physician recommends the following medication changes.  DECREASE: Losartan  to 25 mg once daily  INCREASE: Metoprolol  Succinate to 50 mg once daily  *If you need a refill on your cardiac medications before your next appointment, please call your pharmacy*  Lab Work: None ordered If you have labs (blood work) drawn today and your tests are completely normal, you will receive your results only by: MyChart Message (if you have MyChart) OR A paper copy in the mail If you have any lab test that is abnormal or we need to change your treatment, we will call you to review the results.  Testing/Procedures: None ordered  Follow-Up: At [facility], you and your health needs are our priority.  As part of our continuing mission to provide you with exceptional heart care, our providers are all part of one team.  This team includes your primary Cardiologist (physician) and Advanced Practice Providers or APPs (Physician Assistants and Nurse Practitioners) who all work together to provide you with the care you need, when you need it.  Your next appointment:   6 month(s)  Provider:   You may see Dr. Alvenia Aus or one of the following Advanced Practice Providers on your designated Care Team:   Laneta Pintos, NP Gildardo Labrador, PA-C Varney Gentleman, PA-C Cadence [place], PA-C Ronald Cockayne, NP Morey Ar, NP    We recommend signing up for the patient portal called "MyChart".  Sign up information is provided on this After Visit Summary.  MyChart is used to connect with patients for Virtual Visits (Telemedicine).  Patients are able to view lab/test results, encounter notes, upcoming appointments, etc.  Non-urgent messages can be sent to your provider as well.   To learn more about what you can do with MyChart, go to [email or web address].   Other Instructions Managing the Challenge of Quitting Smoking Quitting smoking is a physical and mental challenge. You  may have cravings, withdrawal symptoms, and temptation to smoke. Before quitting, work with your health care provider to make a plan that can help you manage quitting. Making a plan before you quit may keep you from smoking when you have the urge to smoke while trying to quit. How to manage lifestyle changes Managing stress Stress can make you want to smoke, and wanting to smoke may cause stress. It is important to find ways to manage your stress. You could try some of the following: Practice relaxation techniques. Breathe slowly and deeply, in through your nose and out through your mouth. Listen to music. Soak in a bath or take a shower. Imagine a peaceful place or vacation. Get some support. Talk with family or friends about your stress. Join a support group. Talk with a counselor or therapist. Get some physical activity. Go for a walk, run, or bike ride. Play a favorite sport. Practice yoga.  Medicines Talk with your health care provider about medicines that might help you deal with cravings and make quitting easier for you. Relationships Social situations can be difficult when you are quitting smoking. To manage this, you can: Avoid parties and other social situations where people might be smoking. Avoid alcohol . Leave right away if you have the urge to smoke. Explain to your family and friends that you are quitting smoking. Ask for support and let them know you might be a bit grumpy. Plan activities where smoking is not an option. General instructions Be aware that many people gain weight after they quit smoking. However, not everyone does. To keep  from gaining weight, have a plan in place before you quit, and stick to the plan after you quit. Your plan should include: Eating healthy snacks. When you have a craving, it may help to: Eat popcorn, or try carrots, celery, or other cut vegetables. Chew sugar-free gum. Changing how you eat. Eat small portion sizes at meals. Eat 4-6  small meals throughout the day instead of 1-2 large meals a day. Be mindful when you eat. You should avoid watching television or doing other things that might distract you as you eat. Exercising regularly. Make time to exercise each day. If you do not have time for a long workout, do short bouts of exercise for 5-10 minutes several times a day. Do some form of strengthening exercise, such as weight lifting. Do some exercise that gets your heart beating and causes you to breathe deeply, such as walking fast, running, swimming, or biking. This is very important. Drinking plenty of water or other low-calorie or no-calorie drinks. Drink enough fluid to keep your urine pale yellow.  How to recognize withdrawal symptoms Your body and mind may experience discomfort as you try to get used to not having nicotine  in your system. These effects are called withdrawal symptoms. They may include: Feeling hungrier than normal. Having trouble concentrating. Feeling irritable or restless. Having trouble sleeping. Feeling depressed. Craving a cigarette. These symptoms may surprise you, but they are normal to have when quitting smoking. To manage withdrawal symptoms: Avoid places, people, and activities that trigger your cravings. Remember why you want to quit. Get plenty of sleep. Avoid coffee and other drinks that contain caffeine. These may worsen some of your symptoms. How to manage cravings Come up with a plan for how to deal with your cravings. The plan should include the following: A definition of the specific situation you want to deal with. An activity or action you will take to replace smoking. A clear idea for how this action will help. The name of someone who could help you with this. Cravings usually last for 5-10 minutes. Consider taking the following actions to help you with your plan to deal with cravings: Keep your mouth busy. Chew sugar-free gum. Suck on hard candies or a straw. Brush  your teeth. Keep your hands and body busy. Change to a different activity right away. Squeeze or play with a ball. Do an activity or a hobby, such as making bead jewelry, practicing needlepoint, or working with wood. Mix up your normal routine. Take a short exercise break. Go for a quick walk, or run up and down stairs. Focus on doing something kind or helpful for someone else. Call a friend or family member to talk during a craving. Join a support group. Contact a quitline. Where to find support To get help or find a support group: Call the National Cancer Institute's Smoking Quitline: 1-800-QUIT-NOW 385-589-6187) Text QUIT to SmokefreeTXT: 454098 Where to find more information Visit these websites to find more information on quitting smoking: U.S. Department of Health and Human Services: www.smokefree.gov American Lung Association: www.freedomfromsmoking.org Centers for Disease Control and Prevention (CDC): FootballExhibition.com.br American Heart Association: www.heart.org Contact a health care provider if: You want to change your plan for quitting. The medicines you are taking are not helping. Your eating feels out of control or you cannot sleep. You feel depressed or become very anxious. Summary Quitting smoking is a physical and mental challenge. You will face cravings, withdrawal symptoms, and temptation to smoke again. Preparation can help you as  you go through these challenges. Try different techniques to manage stress, handle social situations, and prevent weight gain. You can deal with cravings by keeping your mouth busy (such as by chewing gum), keeping your hands and body busy, calling family or friends, or contacting a quitline for people who want to quit smoking. You can deal with withdrawal symptoms by avoiding places where people smoke, getting plenty of rest, and avoiding drinks that contain caffeine. This information is not intended to replace advice given to you by your health care  provider. Make sure you discuss any questions you have with your health care provider. Document Revised: 12/10/2020 Document Reviewed: 12/10/2020 Elsevier Patient Education  2024 [employer].

## 2023-06-11 ENCOUNTER — Telehealth: Payer: Self-pay | Admitting: Cardiology

## 2023-06-11 MED ORDER — LOSARTAN POTASSIUM 100 MG PO TABS: 100.0000 mg | ORAL_TABLET | Freq: Every day | ORAL | 3 refills | Status: DC

## 2023-06-11 NOTE — Telephone Encounter (Signed)
 Pt c/o BP issue: STAT if pt c/o blurred vision, one-sided weakness or slurred speech.  STAT if BP is GREATER than 180/120 TODAY.  STAT if BP is LESS than 90/60 and SYMPTOMATIC TODAY  1. What is your BP concern? Pt concerned bp is high   2. Have you taken any BP medication today? No   3. What are your last 5 BP readings? 164/98 hr 70 - was not sure what day  176/105 hr 69 - today  171/91 hr 70 - last night  169/89 hr66 - not sure what day   4. Are you having any other symptoms (ex. Dizziness, headache, blurred vision, passed out)? Dizziness

## 2023-06-11 NOTE — Telephone Encounter (Signed)
 Patient identification verified by 2 forms.   Called and spoke to patient  Patient states:  -Pt states her Metoprolol  has been doubled to 100 mg once daily.  -Has been on this dose for 1 week now. -C/O of dizziness and headache.   Patient denies:  -Additional symptoms, CP.     Interventions/Plan: -Send message to DOD for advise.  -Medication list updated.    Reviewed ED warning signs/precautions  Patient agrees with plan, no questions at this time

## 2023-06-11 NOTE — Telephone Encounter (Signed)
 Spoke with pt and reviewed order for medication increase per Dr Renna Cary.  Current BP is 149/91 per her report.  Encouraged her to discuss with her PCP as well.  Pt states her PCP told her to call here.  Pt will continue to monitor her BP at home and call back if it continues to be elevated.

## 2023-07-02 ENCOUNTER — Telehealth: Payer: Self-pay

## 2023-07-02 NOTE — Telephone Encounter (Signed)
 Pt called in to have transmission reviewed that was sent yesterday.  Transmission reviewed.  Device function WNL.  Presenting rhythm indicates AS/BP with one PVC.  Spoke with Pt.  Advised device function appears normal.  Asked Pt how she was feeling.  She states she feels better today then she did yesterday.  States she took 2 fluid pills yesterday.  Pt thanked nurse for call back.  Had no further concerns.

## 2023-07-02 NOTE — Progress Notes (Signed)
 Remote ICD transmission.

## 2023-07-02 NOTE — Addendum Note (Signed)
 Addended by: TAWNI DRILLING D on: 07/02/2023 10:07 AM   Modules accepted: Orders

## 2023-07-02 NOTE — Telephone Encounter (Signed)
 Pt called in wanting to know if anything was seen on her transmission 07/01/2023. Pt states she feels like she was having a MI and her L arm still hurting

## 2023-07-03 ENCOUNTER — Telehealth: Payer: Self-pay | Admitting: Cardiology

## 2023-07-03 MED ORDER — AMLODIPINE BESYLATE 10 MG PO TABS: 10.0000 mg | ORAL_TABLET | Freq: Every day | ORAL | 3 refills | Status: DC

## 2023-07-03 NOTE — Telephone Encounter (Signed)
 Pt c/o BP issue: STAT if pt c/o blurred vision, one-sided weakness or slurred speech.  STAT if BP is GREATER than 180/120 TODAY.  STAT if BP is LESS than 90/60 and SYMPTOMATIC TODAY  1. What is your BP concern? Pt called in stating she was in the ER but her bp is still high today   2. Have you taken any BP medication today? Yes   3. What are your last 5 BP readings?  166/93 hr 76 - about 10 minutes ago   211/111 hr 73 - about 10 minutes ago   4. Are you having any other symptoms (ex. Dizziness, headache, blurred vision, passed out)? Distorted vision, sometimes is double, sometimes its wave and dizziness

## 2023-07-03 NOTE — Telephone Encounter (Signed)
 Spoke to pt over the phone and she had stated that she had gone to the ED last night in [place] for her BP and they gave her aspirin  and nitroglycerin  and told her to contact her cardiologist within 72 hours. Asked pt to take her BP while on the phone and it was 198/112 with HR 79. Pt stated she has not taken her Losartan  yet today, advised pt to go ahead and take medication. Spoke with DOD (Dr. Ganji) and he advised for pt to start Amlodipine 10 mg once daily. Prescription has been sent in and pt has been notified. ED precautions also discussed with pt. Pt verbalized understanding of plan.

## 2023-07-09 NOTE — Progress Notes (Unsigned)
 Cardiology Office Note    Date:  07/10/2023  ID:  Lindsay Camacho, Lindsay Camacho 01-15-1965, MRN [id number] PCP:  Norrine Sharper, MD  Cardiologist:  Oneil Parchment, MD  Electrophysiologist:  Danelle Birmingham, MD   Chief Complaint: Follow up for hypertension   History of Present Illness: .    Lindsay Camacho is a 58 y.o. female with visit-pertinent history of labs completed and artery stenting in 09/2021, CAD s/p PCI of LCx in 2015, chronic systolic heart failure s/p Medtronic CRT-D with improved EF, severe MR s/p repair in 2018, hyperlipidemia, tobacco abuse and left bundle branch block.  Patient was previously followed by Dr. Dann.  She had a lateral MI in 2015 treated PCI of the left circumflex artery.  Procedure was complicated by retroperitoneal bleed requiring vascular surgery.  Repeat left and right heart cath prior to valve surgery in August 2018 showed patent left circumflex stent with minimal restenosis and mild nonobstructive CAD in the RCA and LAD.  She underwent minimally invasive mitral valve repair by Dr. Dusty for severe symptomatic MR on 08/25/2016.  She underwent Medtronic CRT-D implantation in January 2019 for EF of 20 to 25%.  She was admitted at [facility]  in January 2021 for worsening shortness of breath due to acute on chronic systolic heart failure.  She underwent IV diuresis.  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  She underwent successful angioplasty and the balloon expandable stent placement to the left circumflex artery.  In March 2024 she was admitted with stroke that was thought to be due to small vessel disease.  MRI showed 8 mm acute infarct of the left corona radiata.  CTA showed widely patent left subclavian stent with nonobstructive disease in the carotid arteries.  Echo showed EF 55 to 60% with mild pulmonary hypertension and normal functioning mitral valve prosthesis, mild to moderate  aortic stenosis.  Patient was admitted from 12/28 - 12/31/2022 for chest pain.  She presented to [facility] on 12/26-24 after developing chest pain at rest.  BP upon arrival was 177/82.  She reported substernal chest discomfort and chest pain under her left armpit.  She also reported shortness of breath and headache but no fever or cough.  Chest discomfort worsened with deep inspiration.  Still high sensitive troponin was elevated but flat at 17>>20>>16, proBNP 2898.  CTA of chest showed no filling defects to suggest PE, moderate cardiomegaly with some right heart enlargement and some reflux of contrast into the hepatic vein which may be an indicator for right heart dysfunction, left subclavian artery stent noted, CAD noted, stable scarring of the right middle lobe and lateral right upper lobe.  She was given 1 dose of IV Lasix  40 mg and 40 mill equivalents of potassium and transferred to [facility].  Echo on 12/30/2022 showed EF 55%, mild LVH, annual plasty mitral ring with trivial residual MR, severe TR, mild AI and mild aortic stenosis.  CRP and ESR were elevated, she was treated for presumed acute pericarditis with colchicine .  Carvedilol  was switched to metoprolol .  Patient was seen in clinic on 03/30/2023 by Dr. Parchment.  Patient reported that she had discontinued Nexletol  due to cost and had stopped taking cholesterol medication due to adverse effects.  Patient was last seen on 05/22/2023 by Dr. Darron, patient reported recent episodes of palpitations and tachycardia, her Toprol  was increased to 50 mg daily and losartan  was decreased to 25 mg daily.  On 07/03/2023 patient notified the office that she has gone to ED last night in [place] for blood pressure, they gave her aspirin  and nitroglycerin  and told her to contact her cardiologist.  While on the phone patient's blood pressure was 190/112 with heart rate of 79, patient reported she had not taken her losartan , patient was started on amlodipine  10 mg  daily.  Today patient presents for follow-up.  She reports that she is doing well, reports the night she went to the emergency room she had some chest discomfort, headache and overall felt poorly.  Patient reports that with controlling her blood pressure her discomfort resolved.  Patient denies any further chest discomfort, she denies switched shortness of breath, lower extremity edema, orthopnea or PND.  Patient reports that she has been tolerating her medications well.  She reports that her blood pressure at home has been much better controlled, averaging 130/80.    ROS: .   Today she denies chest pain, shortness of breath, lower extremity edema, fatigue, melena, hematuria, hemoptysis, diaphoresis, weakness, presyncope, syncope, orthopnea, and PND.  All other systems are reviewed and otherwise negative. Studies Reviewed: SABRA   EKG:  EKG is not ordered today, patient deferred.  CV Studies: Cardiac studies reviewed are outlined and summarized above. Otherwise please see EMR for full report. Cardiac Studies & Procedures   ______________________________________________________________________________________________ CARDIAC CATHETERIZATION  CARDIAC CATHETERIZATION 08/21/2016  Conclusion  Ost RCA to Prox RCA lesion, 10 %stenosed.  Dist RCA lesion, 30 %stenosed.  Prox Cx to Mid Cx lesion, 10 %stenosed.  Prox LAD to Mid LAD lesion, 30 %stenosed.  1. Patent stent in the Circumflex artery with minimal restenosis 2. Mild non-obstructive disease in the RCA and LAD (of note, there was spasm in the RCA with catheter engagement that resolved with SL NTG)  Recommendations: Medical management of CAD. Continue planning for valve repair/replacement.  Findings Coronary Findings Diagnostic  Dominance: Right  Left Anterior Descending Vessel is large.  First Diagonal Branch Vessel is moderate in size.  First Septal Branch Vessel is small in size.  Second Diagonal Branch Vessel is moderate in  size.  Second Septal Branch Vessel is small in size.  Third Diagonal Branch Vessel is small in size.  Third Septal Branch Vessel is small in size.  Left Circumflex Vessel is large. The lesion was previously treated using a stent (unknown type) over 2 years ago.  First Obtuse Marginal Branch Vessel is small in size.  Second Obtuse Marginal Branch Vessel is large in size.  Third Obtuse Marginal Branch Vessel is small in size.  Right Coronary Artery  Intervention  No interventions have been documented.   CARDIAC CATHETERIZATION  CARDIAC CATHETERIZATION 01/15/2015   STRESS TESTS  MYOCARDIAL PERFUSION IMAGING 11/10/2015  Interpretation Summary  The left ventricular ejection fraction is severely decreased (<30%).  Nuclear stress EF: 29%.  There was no ST segment deviation noted during stress.  This is a high risk study due to reduced systolic function.  No ischemia identified.   ECHOCARDIOGRAM  ECHOCARDIOGRAM COMPLETE 12/30/2022  Narrative ECHOCARDIOGRAM REPORT    Patient Name:   Lindsay Camacho Date of Exam: 12/30/2022 Medical Rec #:  [id number]     Height:       61.0 in Accession #:    [id number]    Weight:       105.9 lb Date of Birth:  October 31, 1965     BSA:          1.442 m Patient Age:  57 years      BP:           141/82 mmHg Patient Gender: F             HR:           65 bpm. Exam Location:  Inpatient  Procedure: 2D Echo, 3D Echo, Color Doppler, Cardiac Doppler and Strain Analysis  Indications:    Chest Pain  History:        Patient has prior history of Echocardiogram examinations, most recent 03/26/2022. CHF and Non Ischemic Cardiomyopathy, Acute MI and CAD, Pacemaker, Stroke, Mitral Valve Disease, Arrythmias:LBBB, Signs/Symptoms:Chest Pain and Dyspnea; Risk Factors:Dyslipidemia, Hypertension and Current Smoker. Celestia Werner Clause IMR MV Replacement.  Sonographer:    Logan Shove RDCS Referring Phys: [phone] HAO MENG  IMPRESSIONS   1.  Left ventricular ejection fraction, by estimation, is 55%. The left ventricle has normal function. The left ventricle has no regional wall motion abnormalities. There is mild left ventricular hypertrophy. Left ventricular diastolic parameters are indeterminate. The average left ventricular global longitudinal strain is -15.7 %. The global longitudinal strain is normal. 2. Catheter in RA/RV . Right ventricular systolic function is normal. The right ventricular size is normal. 3. Left atrial size was mildly dilated. 4. Post annuloplasty ring repair with trivial residual MR mean diastolic gradient 3 mmHg at HR of 67 bpm. The mitral valve has been repaired/replaced. No evidence of mitral valve regurgitation. No evidence of mitral stenosis. Procedure Date: 08/25/2016. 5. Tricuspid valve regurgitation is severe. 6. The aortic valve is tricuspid. There is moderate calcification of the aortic valve. There is moderate thickening of the aortic valve. Aortic valve regurgitation is mild. Mild aortic valve stenosis. 7. The inferior vena cava is normal in size with greater than 50% respiratory variability, suggesting right atrial pressure of 3 mmHg.  FINDINGS Left Ventricle: Left ventricular ejection fraction, by estimation, is 55%. The left ventricle has normal function. The left ventricle has no regional wall motion abnormalities. The average left ventricular global longitudinal strain is -15.7 %. The global longitudinal strain is normal. The left ventricular internal cavity size was normal in size. There is mild left ventricular hypertrophy. Left ventricular diastolic parameters are indeterminate.  Right Ventricle: Catheter in RA/RV. The right ventricular size is normal. No increase in right ventricular wall thickness. Right ventricular systolic function is normal.  Left Atrium: Left atrial size was mildly dilated.  Right Atrium: Right atrial size was normal in size.  Pericardium: There is no evidence of  pericardial effusion.  Mitral Valve: Post annuloplasty ring repair with trivial residual MR mean diastolic gradient 3 mmHg at HR of 67 bpm. The mitral valve has been repaired/replaced. No evidence of mitral valve regurgitation. No evidence of mitral valve stenosis. MV peak gradient, 10.1 mmHg. The mean mitral valve gradient is 3.0 mmHg.  Tricuspid Valve: The tricuspid valve is normal in structure. Tricuspid valve regurgitation is severe. No evidence of tricuspid stenosis.  Aortic Valve: The aortic valve is tricuspid. There is moderate calcification of the aortic valve. There is moderate thickening of the aortic valve. Aortic valve regurgitation is mild. Aortic regurgitation PHT measures 444 msec. Mild aortic stenosis is present. Aortic valve mean gradient measures 5.0 mmHg. Aortic valve peak gradient measures 10.2 mmHg. Aortic valve area, by VTI measures 1.85 cm.  Pulmonic Valve: The pulmonic valve was normal in structure. Pulmonic valve regurgitation is mild. No evidence of pulmonic stenosis.  Aorta: The aortic root is normal in size and structure.  Venous:  The inferior vena cava is normal in size with greater than 50% respiratory variability, suggesting right atrial pressure of 3 mmHg.  IAS/Shunts: No atrial level shunt detected by color flow Doppler.  Additional Comments: A device lead is visualized.   LEFT VENTRICLE PLAX 2D LVIDd:         3.90 cm   Diastology LVIDs:         2.90 cm   LV e' medial:    5.26 cm/s LV PW:         1.20 cm   LV E/e' medial:  27.2 LV IVS:        1.20 cm   LV e' lateral:   6.67 cm/s LVOT diam:     1.80 cm   LV E/e' lateral: 21.4 LV SV:         55 LV SV Index:   38        2D Longitudinal Strain LVOT Area:     2.54 cm  2D Strain GLS Avg:     -15.7 %  3D Volume EF: 3D EF:        52 % LV EDV:       130 ml LV ESV:       62 ml LV SV:        68 ml  RIGHT VENTRICLE             IVC RV Basal diam:  3.10 cm     IVC diam: 1.80 cm RV S prime:     11.60  cm/s TAPSE (M-mode): 1.8 cm  LEFT ATRIUM             Index        RIGHT ATRIUM           Index LA diam:        3.80 cm 2.64 cm/m   RA Area:     18.60 cm LA Vol (A2C):   80.8 ml 56.05 ml/m  RA Volume:   53.20 ml  36.91 ml/m LA Vol (A4C):   37.3 ml 25.88 ml/m LA Biplane Vol: 58.0 ml 40.24 ml/m AORTIC VALVE AV Area (Vmax):    1.77 cm AV Area (Vmean):   1.80 cm AV Area (VTI):     1.85 cm AV Vmax:           160.00 cm/s AV Vmean:          102.000 cm/s AV VTI:            0.300 m AV Peak Grad:      10.2 mmHg AV Mean Grad:      5.0 mmHg LVOT Vmax:         111.00 cm/s LVOT Vmean:        72.300 cm/s LVOT VTI:          0.218 m LVOT/AV VTI ratio: 0.73 AI PHT:            444 msec  AORTA Ao Root diam: 2.30 cm Ao Asc diam:  3.00 cm  MITRAL VALVE                TRICUSPID VALVE MV Area (PHT): 2.76 cm     TR Peak grad:   32.5 mmHg MV Area VTI:   1.28 cm     TR Mean grad:   17.0 mmHg MV Peak grad:  10.1 mmHg    TR Vmax:        285.00 cm/s MV Mean grad:  3.0 mmHg  TR Vmean:       190.0 cm/s MV Vmax:       1.59 m/s MV Vmean:      85.9 cm/s    SHUNTS MV Decel Time: 275 msec     Systemic VTI:  0.22 m MV E velocity: 143.00 cm/s  Systemic Diam: 1.80 cm MV A velocity: 78.60 cm/s MV E/A ratio:  1.82  Maude Emmer MD Electronically signed by Maude Emmer MD Signature Date/Time: 12/30/2022/10:48:08 AM    Final   TEE  ECHO TEE 08/18/2016  Narrative *[place]* *[facility]* [address] [place], [place] [address] [phone]  ------------------------------------------------------------------- Transesophageal Echocardiography  Patient:    Camron, Essman MR #:       [id number] Study Date: 08/18/2016 Gender:     F Age:        51 Height:     154.9 cm Weight:     57.1 kg BSA:        1.58 m^2 Pt. Status: Room:       3W23C  ADMITTING    Dorn Lesches, MD ORDERING     Redell Shallow PERFORMING   Redell Shallow REFERRING    [place] ATTENDING     Leim Moose, M.D. SONOGRAPHER  Chrystine Batchuluun  cc:  ------------------------------------------------------------------- LV EF: 35% -   40%  ------------------------------------------------------------------- History:   PMH:  Mitral insuffieciency.  ------------------------------------------------------------------- Study Conclusions  - Left ventricle: Systolic function was moderately reduced. The estimated ejection fraction was in the range of 35% to 40%. Diffuse hypokinesis. - Aortic valve: No evidence of vegetation. There was mild regurgitation. - Mitral valve: Mobility of the posterior leaflet was restricted. No evidence of vegetation. There was severe regurgitation. - Left atrium: The atrium was moderately dilated. No evidence of thrombus in the atrial cavity or appendage. - Right atrium: No evidence of thrombus in the atrial cavity or appendage. - Atrial septum: No defect or patent foramen ovale was identified. - Tricuspid valve: No evidence of vegetation. - Pulmonic valve: No evidence of vegetation.  Impressions:  - Moderate global reduction in LV systolic function; mild AI; thickened MV with restricted posterior leaflet; severe, central MR; moderate LAE; mild TR.  ------------------------------------------------------------------- Study data:   Study status:  Routine.  Consent:  The risks, benefits, and alternatives to the procedure were explained to the patient and informed consent was obtained.  Procedure:  Initial setup. The patient was brought to the laboratory. Surface ECG leads were monitored. Sedation. Conscious sedation was administered. Transesophageal echocardiography. Topical anesthesia was obtained using viscous lidocaine . A transesophageal probe was inserted by the attending cardiologist. Image quality was adequate.  Study completion:  The patient tolerated the procedure well. There were no complications.  Administered medications:    Fentanyl , 25mcg, IV. Midazolam , 4mg , IV.          Diagnostic transesophageal echocardiography.  2D and color Doppler.  Birthdate:  Patient birthdate: 1965-03-17.  Age:  Patient is 58 yr old.  Sex:  Gender: female.    BMI: 23.8 kg/m^2.  Blood pressure:     107/70  Patient status:  Inpatient.  Study date:  Study date: 08/18/2016. Study time: 12:01 PM.  Location:  Endoscopy.  -------------------------------------------------------------------  ------------------------------------------------------------------- Left ventricle:  Systolic function was moderately reduced. The estimated ejection fraction was in the range of 35% to 40%. Diffuse hypokinesis.  ------------------------------------------------------------------- Aortic valve:   Mildly thickened leaflets. Cusp separation was normal.  No evidence of vegetation.  Doppler:  There was mild regurgitation.  -------------------------------------------------------------------  Aorta:  Descending aorta: The descending aorta had mild diffuse disease.  ------------------------------------------------------------------- Mitral valve:   Mildly thickened leaflets . Leaflet separation was normal. Mobility of the posterior leaflet was restricted.  No evidence of vegetation.  Doppler:  There was severe regurgitation.  ------------------------------------------------------------------- Left atrium:  The atrium was moderately dilated.  No evidence of thrombus in the atrial cavity or appendage.  ------------------------------------------------------------------- Atrial septum:  No defect or patent foramen ovale was identified.  ------------------------------------------------------------------- Right ventricle:  The cavity size was normal. Systolic function was normal.  ------------------------------------------------------------------- Pulmonic valve:    Structurally normal valve.   Cusp separation was normal.  No evidence of vegetation.   Doppler:  There was trivial regurgitation.  ------------------------------------------------------------------- Tricuspid valve:   Structurally normal valve.   Leaflet separation was normal.  No evidence of vegetation.  Doppler:  There was mild regurgitation.  ------------------------------------------------------------------- Right atrium:  The atrium was normal in size.  No evidence of thrombus in the atrial cavity or appendage.  ------------------------------------------------------------------- Pericardium:  There was no pericardial effusion.  ------------------------------------------------------------------- Prepared and Electronically Authenticated by  Redell Shallow 2018-08-17T17:33:56        ______________________________________________________________________________________________       Current Reported Medications:.    Current Meds  Medication Sig   amLODipine  (NORVASC ) 10 MG tablet Take 1 tablet (10 mg total) by mouth daily.   aspirin  EC 81 MG tablet Take 81 mg by mouth daily.   atorvastatin  (LIPITOR ) 80 MG tablet Take 1 tablet (80 mg total) by mouth daily.   clopidogrel  (PLAVIX ) 75 MG tablet TAKE 1 TABLET BY MOUTH DAILY   colchicine  0.6 MG tablet Take 1 tablet (0.6 mg total) by mouth 2 (two) times daily.   cyclobenzaprine  (FLEXERIL ) 10 MG tablet Take 1 tablet (10 mg total) by mouth 3 (three) times daily as needed for muscle spasms.   ergocalciferol  (VITAMIN D2) 1.25 MG (50000 UT) capsule Take 1 capsule (50,000 Units total) by mouth every Friday.   furosemide  (LASIX ) 40 MG tablet Take 1 tablet (40 mg total) by mouth daily as needed for fluid or edema (shortness of breath).   losartan  (COZAAR ) 100 MG tablet Take 1 tablet (100 mg total) by mouth daily.   meloxicam (MOBIC) 15 MG tablet Take 15 mg by mouth as needed for pain.   Menthol-Methyl Salicylate (SALONPAS PAIN RELIEF PATCH EX) Apply 1 patch topically daily as needed (pain).   pantoprazole  (PROTONIX ) 40 MG  tablet Take 1 tablet (40 mg total) by mouth daily.   VENTOLIN  HFA 108 (90 Base) MCG/ACT inhaler Inhale 1-2 puffs into the lungs every 4 (four) hours as needed for shortness of breath.   [DISCONTINUED] Bempedoic Acid  (NEXLETOL ) 180 MG TABS Take 1 tablet (180 mg total) by mouth daily.   [DISCONTINUED] metoprolol  succinate (TOPROL -XL) 50 MG 24 hr tablet Take 1 tablet (50 mg total) by mouth daily. (Patient taking differently: Take 100 mg by mouth daily.)   [DISCONTINUED] spironolactone  (ALDACTONE ) 25 MG tablet Take 1/2 (one-half) tablet by mouth once daily    Physical Exam:    VS:  BP 132/78 (BP Location: Right Arm)   Pulse 72   Ht 5' 1 (1.549 m)   Wt 109 lb 6.4 oz (49.6 kg)   SpO2 98%   BMI 20.67 kg/m    Wt Readings from Last 3 Encounters:  07/10/23 109 lb 6.4 oz (49.6 kg)  05/22/23 107 lb (48.5 kg)  03/30/23 113 lb 12.8 oz (51.6 kg)    GEN: Well nourished, well developed in no  acute distress NECK: No JVD; No carotid bruits CARDIAC: RRR, no murmurs, rubs, gallops RESPIRATORY:  Clear to auscultation without rales, wheezing or rhonchi  ABDOMEN: Soft, non-tender, non-distended EXTREMITIES:  No edema; No acute deformity     Asessement and Plan:.    CAD: History of lateral MI in 2015 treated PCI to LCx.  Repeat cath in 08/2016 revealed patent LCx stent with no restenosis and mild nonobstructive CAD in RCA and LAD. Today she reports  Check BMET   ICM/Chronic HFrEF: EF is low to 20 to 25% on echo on 01/2017 at time of MV repair, she underwent ICD implant.  Normalization of EF on echo in 03/2020.  Echo on 12/30/2022 with normal LVEF 55%, mild LVH, indeterminate diastolic parameters, normal RV. Today she appears euvolemic and well compensated on exam.  Check BMET   ICD implanted: S/p Medtronic ICD implant in 01/2017 secondary to chronic HFrEF/ICM and left bundle branch block. Device check Management per EP?  Hypertension: BP higher in right arm than in left. 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  Managed by Dr. Darron.  Mitral valve replacement: S/p mitral valve repair in 2018.  Echo on 12/30/2022 revealed trivial residual MR with mean diastolic gradient 3 mmHg, no evidence of MR or MS, EF 55%, mild LVH, indeterminate diastolic parameters. Continue SBE prophylaxis.  Aortic valve disease: Moderate calcification of the aortic valve with mild AI and mild aortic valve stenosis on echo on 12/22/2022. Patient remains asymptomatic.  Hyperlipidemia: Patient intolerant of PCSK9 inhibitor, did not like the way she felt after taking it, unwilling to try any other injectable medications.  Patient reports that her pharmacy never filled her prescription for Nexletol , she is willing to try the medication, will send refill today. Check fasting lipid profile and LFTs in 2 months. Continue atorvastatin  80 mg daily.     Disposition: F/u with Dr. Jeffrie in three months per patient request.   Signed, Nyeem Stoke D Tareka Jhaveri, NP

## 2023-07-10 ENCOUNTER — Ambulatory Visit: Attending: Cardiology | Admitting: Cardiology

## 2023-07-10 ENCOUNTER — Encounter: Payer: Self-pay | Admitting: Cardiology

## 2023-07-10 VITALS — BP 132/78 | HR 72 | Ht 61.0 in | Wt 109.4 lb

## 2023-07-10 DIAGNOSIS — I255 Ischemic cardiomyopathy: Secondary | ICD-10-CM | POA: Diagnosis not present

## 2023-07-10 DIAGNOSIS — Z79899 Other long term (current) drug therapy: Secondary | ICD-10-CM

## 2023-07-10 DIAGNOSIS — Z95810 Presence of automatic (implantable) cardiac defibrillator: Secondary | ICD-10-CM | POA: Diagnosis not present

## 2023-07-10 DIAGNOSIS — I2510 Atherosclerotic heart disease of native coronary artery without angina pectoris: Secondary | ICD-10-CM | POA: Diagnosis not present

## 2023-07-10 DIAGNOSIS — I10 Essential (primary) hypertension: Secondary | ICD-10-CM

## 2023-07-10 DIAGNOSIS — Z9889 Other specified postprocedural states: Secondary | ICD-10-CM

## 2023-07-10 DIAGNOSIS — E782 Mixed hyperlipidemia: Secondary | ICD-10-CM

## 2023-07-10 DIAGNOSIS — I5022 Chronic systolic (congestive) heart failure: Secondary | ICD-10-CM

## 2023-07-10 DIAGNOSIS — I771 Stricture of artery: Secondary | ICD-10-CM

## 2023-07-10 DIAGNOSIS — I359 Nonrheumatic aortic valve disorder, unspecified: Secondary | ICD-10-CM

## 2023-07-10 MED ORDER — NEXLETOL 180 MG PO TABS: 180.0000 mg | ORAL_TABLET | Freq: Every day | ORAL | 3 refills | Status: DC

## 2023-07-10 MED ORDER — SPIRONOLACTONE 25 MG PO TABS: 12.5000 mg | ORAL_TABLET | Freq: Every day | ORAL | 3 refills | Status: DC

## 2023-07-10 MED ORDER — METOPROLOL SUCCINATE ER 50 MG PO TB24: 100.0000 mg | ORAL_TABLET | Freq: Every day | ORAL | 3 refills | Status: DC

## 2023-07-10 NOTE — Patient Instructions (Signed)
 Medication Instructions:  Your physician recommends that you continue on your current medications as directed. Please refer to the Current Medication list given to you today.  *If you need a refill on your cardiac medications before your next appointment, please call your pharmacy*  Lab Work: BMET today Fasting lipid panel & Lfts in 2 months   Testing/Procedures: NONE ordered at this time of appointment   Follow-Up: At [facility], you and your health needs are our priority.  As part of our continuing mission to provide you with exceptional heart care, our providers are all part of one team.  This team includes your primary Cardiologist (physician) and Advanced Practice Providers or APPs (Physician Assistants and Nurse Practitioners) who all work together to provide you with the care you need, when you need it.  Your next appointment:   3 month Dr. Jeffrie Pacer check August or September 2025.   Provider:   Oneil Jeffrie, MD    We recommend signing up for the patient portal called MyChart.  Sign up information is provided on this After Visit Summary.  MyChart is used to connect with patients for Virtual Visits (Telemedicine).  Patients are able to view lab/test results, encounter notes, upcoming appointments, etc.  Non-urgent messages can be sent to your provider as well.   To learn more about what you can do with MyChart, go to [email or web address].

## 2023-07-11 ENCOUNTER — Ambulatory Visit: Payer: Self-pay | Admitting: Cardiology

## 2023-07-11 LAB — BASIC METABOLIC PANEL WITH GFR
BUN/Creatinine Ratio: 13 (ref 9–23)
BUN: 11 mg/dL (ref 6–24)
CO2: 22 mmol/L (ref 20–29)
Calcium: 9.4 mg/dL (ref 8.7–10.2)
Chloride: 102 mmol/L (ref 96–106)
Creatinine, Ser: 0.82 mg/dL (ref 0.57–1.00)
Glucose: 75 mg/dL (ref 70–99)
Potassium: 4.3 mmol/L (ref 3.5–5.2)
Sodium: 140 mmol/L (ref 134–144)
eGFR: 83 mL/min/1.73 (ref >59)

## 2023-07-12 ENCOUNTER — Encounter: Payer: Self-pay | Admitting: Cardiology

## 2023-07-12 NOTE — Telephone Encounter (Signed)
-----   Message from Katlyn D West sent at 07/11/2023  1:13 PM EDT ----- Please let Lindsay Camacho know that her lab work shows normal kidney function and electrolytes. Good results, continue current medications.  ----- Message ----- From: Interface, [facility] Lab Results In Sent: 07/11/2023   1:35 AM EDT To: Katlyn D West, NP

## 2023-07-12 NOTE — Telephone Encounter (Signed)
 Called patient advised of below they verbalized understanding.

## 2023-08-10 ENCOUNTER — Inpatient Hospital Stay (HOSPITAL_COMMUNITY)
Admission: EM | Admit: 2023-08-10 | Discharge: 2023-08-23 | DRG: 235 | Disposition: A | Discharge status: Home Health | Source: Other Acute Inpatient Hospital | Attending: Thoracic Surgery (Cardiothoracic Vascular Surgery) | Admitting: Thoracic Surgery (Cardiothoracic Vascular Surgery)

## 2023-08-10 ENCOUNTER — Encounter (HOSPITAL_COMMUNITY): Payer: Self-pay

## 2023-08-10 DIAGNOSIS — D62 Acute posthemorrhagic anemia: Secondary | ICD-10-CM | POA: Diagnosis not present

## 2023-08-10 DIAGNOSIS — J449 Chronic obstructive pulmonary disease, unspecified: Secondary | ICD-10-CM | POA: Diagnosis present

## 2023-08-10 DIAGNOSIS — R739 Hyperglycemia, unspecified: Secondary | ICD-10-CM | POA: Diagnosis not present

## 2023-08-10 DIAGNOSIS — F17210 Nicotine dependence, cigarettes, uncomplicated: Secondary | ICD-10-CM | POA: Diagnosis present

## 2023-08-10 DIAGNOSIS — T8111XA Postprocedural  cardiogenic shock, initial encounter: Secondary | ICD-10-CM | POA: Diagnosis not present

## 2023-08-10 DIAGNOSIS — I4891 Unspecified atrial fibrillation: Secondary | ICD-10-CM | POA: Diagnosis not present

## 2023-08-10 DIAGNOSIS — I10 Essential (primary) hypertension: Secondary | ICD-10-CM | POA: Diagnosis not present

## 2023-08-10 DIAGNOSIS — I214 Non-ST elevation (NSTEMI) myocardial infarction: Principal | ICD-10-CM | POA: Diagnosis present

## 2023-08-10 DIAGNOSIS — I071 Rheumatic tricuspid insufficiency: Secondary | ICD-10-CM | POA: Diagnosis present

## 2023-08-10 DIAGNOSIS — Z8673 Personal history of transient ischemic attack (TIA), and cerebral infarction without residual deficits: Secondary | ICD-10-CM

## 2023-08-10 DIAGNOSIS — I959 Hypotension, unspecified: Secondary | ICD-10-CM | POA: Diagnosis not present

## 2023-08-10 DIAGNOSIS — I5032 Chronic diastolic (congestive) heart failure: Secondary | ICD-10-CM | POA: Diagnosis not present

## 2023-08-10 DIAGNOSIS — Z8616 Personal history of COVID-19: Secondary | ICD-10-CM

## 2023-08-10 DIAGNOSIS — I25119 Atherosclerotic heart disease of native coronary artery with unspecified angina pectoris: Secondary | ICD-10-CM | POA: Diagnosis not present

## 2023-08-10 DIAGNOSIS — Z7902 Long term (current) use of antithrombotics/antiplatelets: Secondary | ICD-10-CM | POA: Diagnosis not present

## 2023-08-10 DIAGNOSIS — Z951 Presence of aortocoronary bypass graft: Secondary | ICD-10-CM

## 2023-08-10 DIAGNOSIS — J90 Pleural effusion, not elsewhere classified: Secondary | ICD-10-CM | POA: Diagnosis not present

## 2023-08-10 DIAGNOSIS — I428 Other cardiomyopathies: Secondary | ICD-10-CM | POA: Diagnosis present

## 2023-08-10 DIAGNOSIS — I5023 Acute on chronic systolic (congestive) heart failure: Secondary | ICD-10-CM | POA: Diagnosis present

## 2023-08-10 DIAGNOSIS — Z95810 Presence of automatic (implantable) cardiac defibrillator: Secondary | ICD-10-CM

## 2023-08-10 DIAGNOSIS — E785 Hyperlipidemia, unspecified: Secondary | ICD-10-CM | POA: Diagnosis not present

## 2023-08-10 DIAGNOSIS — E8729 Other acidosis: Secondary | ICD-10-CM | POA: Diagnosis not present

## 2023-08-10 DIAGNOSIS — Z8249 Family history of ischemic heart disease and other diseases of the circulatory system: Secondary | ICD-10-CM

## 2023-08-10 DIAGNOSIS — Z9049 Acquired absence of other specified parts of digestive tract: Secondary | ICD-10-CM | POA: Diagnosis not present

## 2023-08-10 DIAGNOSIS — I252 Old myocardial infarction: Secondary | ICD-10-CM | POA: Diagnosis not present

## 2023-08-10 DIAGNOSIS — Z9851 Tubal ligation status: Secondary | ICD-10-CM | POA: Diagnosis not present

## 2023-08-10 DIAGNOSIS — I310 Chronic adhesive pericarditis: Secondary | ICD-10-CM | POA: Diagnosis present

## 2023-08-10 DIAGNOSIS — Z95820 Peripheral vascular angioplasty status with implants and grafts: Secondary | ICD-10-CM | POA: Diagnosis not present

## 2023-08-10 DIAGNOSIS — I25110 Atherosclerotic heart disease of native coronary artery with unstable angina pectoris: Secondary | ICD-10-CM | POA: Diagnosis not present

## 2023-08-10 DIAGNOSIS — D696 Thrombocytopenia, unspecified: Secondary | ICD-10-CM | POA: Diagnosis not present

## 2023-08-10 DIAGNOSIS — Z9889 Other specified postprocedural states: Secondary | ICD-10-CM | POA: Diagnosis not present

## 2023-08-10 DIAGNOSIS — Z955 Presence of coronary angioplasty implant and graft: Secondary | ICD-10-CM

## 2023-08-10 DIAGNOSIS — Z7982 Long term (current) use of aspirin: Secondary | ICD-10-CM | POA: Diagnosis not present

## 2023-08-10 DIAGNOSIS — I2510 Atherosclerotic heart disease of native coronary artery without angina pectoris: Secondary | ICD-10-CM | POA: Diagnosis present

## 2023-08-10 DIAGNOSIS — I708 Atherosclerosis of other arteries: Secondary | ICD-10-CM | POA: Diagnosis present

## 2023-08-10 DIAGNOSIS — Z79899 Other long term (current) drug therapy: Secondary | ICD-10-CM | POA: Diagnosis not present

## 2023-08-10 DIAGNOSIS — I4901 Ventricular fibrillation: Secondary | ICD-10-CM | POA: Diagnosis not present

## 2023-08-10 DIAGNOSIS — J9811 Atelectasis: Secondary | ICD-10-CM | POA: Diagnosis not present

## 2023-08-10 DIAGNOSIS — I110 Hypertensive heart disease with heart failure: Secondary | ICD-10-CM | POA: Diagnosis present

## 2023-08-10 DIAGNOSIS — I871 Compression of vein: Secondary | ICD-10-CM | POA: Diagnosis not present

## 2023-08-10 DIAGNOSIS — Z880 Allergy status to penicillin: Secondary | ICD-10-CM

## 2023-08-10 DIAGNOSIS — I771 Stricture of artery: Secondary | ICD-10-CM | POA: Diagnosis not present

## 2023-08-10 DIAGNOSIS — J9601 Acute respiratory failure with hypoxia: Secondary | ICD-10-CM | POA: Diagnosis not present

## 2023-08-10 DIAGNOSIS — I5022 Chronic systolic (congestive) heart failure: Secondary | ICD-10-CM | POA: Diagnosis not present

## 2023-08-10 DIAGNOSIS — I081 Rheumatic disorders of both mitral and tricuspid valves: Secondary | ICD-10-CM | POA: Diagnosis not present

## 2023-08-10 DIAGNOSIS — Z888 Allergy status to other drugs, medicaments and biological substances status: Secondary | ICD-10-CM

## 2023-08-10 DIAGNOSIS — R079 Chest pain, unspecified: Secondary | ICD-10-CM | POA: Diagnosis present

## 2023-08-10 DIAGNOSIS — Z7984 Long term (current) use of oral hypoglycemic drugs: Secondary | ICD-10-CM

## 2023-08-10 DIAGNOSIS — Y838 Other surgical procedures as the cause of abnormal reaction of the patient, or of later complication, without mention of misadventure at the time of the procedure: Secondary | ICD-10-CM | POA: Diagnosis not present

## 2023-08-10 DIAGNOSIS — E876 Hypokalemia: Secondary | ICD-10-CM | POA: Diagnosis not present

## 2023-08-10 DIAGNOSIS — Y92239 Unspecified place in hospital as the place of occurrence of the external cause: Secondary | ICD-10-CM | POA: Diagnosis not present

## 2023-08-10 DIAGNOSIS — Z823 Family history of stroke: Secondary | ICD-10-CM

## 2023-08-10 DIAGNOSIS — Z7401 Bed confinement status: Secondary | ICD-10-CM | POA: Diagnosis not present

## 2023-08-10 DIAGNOSIS — Z720 Tobacco use: Secondary | ICD-10-CM | POA: Diagnosis not present

## 2023-08-10 DIAGNOSIS — I083 Combined rheumatic disorders of mitral, aortic and tricuspid valves: Secondary | ICD-10-CM | POA: Diagnosis present

## 2023-08-10 DIAGNOSIS — R570 Cardiogenic shock: Secondary | ICD-10-CM | POA: Diagnosis not present

## 2023-08-10 DIAGNOSIS — R579 Shock, unspecified: Secondary | ICD-10-CM | POA: Diagnosis not present

## 2023-08-10 DIAGNOSIS — R61 Generalized hyperhidrosis: Secondary | ICD-10-CM | POA: Diagnosis not present

## 2023-08-10 DIAGNOSIS — Z01810 Encounter for preprocedural cardiovascular examination: Secondary | ICD-10-CM | POA: Diagnosis not present

## 2023-08-10 LAB — COMPREHENSIVE METABOLIC PANEL WITH GFR
ALT: 13 U/L (ref 0–44)
AST: 25 U/L (ref 15–41)
Albumin: 3 g/dL — ABNORMAL LOW (ref 3.5–5.0)
Alkaline Phosphatase: 71 U/L (ref 38–126)
Anion gap: 9 (ref 5–15)
BUN: 12 mg/dL (ref 6–20)
CO2: 20 mmol/L — ABNORMAL LOW (ref 22–32)
Calcium: 8.9 mg/dL (ref 8.9–10.3)
Chloride: 109 mmol/L (ref 98–111)
Creatinine, Ser: 0.85 mg/dL (ref 0.44–1.00)
GFR, Estimated: >60 mL/min (ref >60)
Glucose, Bld: 141 mg/dL — ABNORMAL HIGH (ref 70–99)
Potassium: 3.7 mmol/L (ref 3.5–5.1)
Sodium: 138 mmol/L (ref 135–145)
Total Bilirubin: 1.1 mg/dL (ref 0.0–1.2)
Total Protein: 6.1 g/dL — ABNORMAL LOW (ref 6.5–8.1)

## 2023-08-10 LAB — CBC WITH DIFFERENTIAL/PLATELET
Abs Immature Granulocytes: 0.03 K/uL (ref 0.00–0.07)
Basophils Absolute: 0 K/uL (ref 0.0–0.1)
Basophils Relative: 0 %
Eosinophils Absolute: 0 K/uL (ref 0.0–0.5)
Eosinophils Relative: 0 %
HCT: 44.2 % (ref 36.0–46.0)
Hemoglobin: 14.4 g/dL (ref 12.0–15.0)
Immature Granulocytes: 0 %
Lymphocytes Relative: 8 %
Lymphs Abs: 0.6 K/uL — ABNORMAL LOW (ref 0.7–4.0)
MCH: 28.6 pg (ref 26.0–34.0)
MCHC: 32.6 g/dL (ref 30.0–36.0)
MCV: 87.7 fL (ref 80.0–100.0)
Monocytes Absolute: 0.1 K/uL (ref 0.1–1.0)
Monocytes Relative: 1 %
Neutro Abs: 6.8 K/uL (ref 1.7–7.7)
Neutrophils Relative %: 91 %
Platelets: 185 K/uL (ref 150–400)
RBC: 5.04 MIL/uL (ref 3.87–5.11)
RDW: 14.3 % (ref 11.5–15.5)
WBC: 7.5 K/uL (ref 4.0–10.5)
nRBC: 0 % (ref 0.0–0.2)

## 2023-08-10 MED ORDER — LOSARTAN POTASSIUM 50 MG PO TABS
100.0000 mg | ORAL_TABLET | Freq: Every day | ORAL | Status: DC
  Administered 2023-08-11 – 2023-08-14 (×6): 100 mg via ORAL
  Filled 2023-08-10 (×4): qty 2

## 2023-08-10 MED ORDER — CARVEDILOL 3.125 MG PO TABS
3.1250 mg | ORAL_TABLET | Freq: Two times a day (BID) | ORAL | Status: DC
  Administered 2023-08-10 – 2023-08-15 (×17): 3.125 mg via ORAL
  Filled 2023-08-10 (×11): qty 1

## 2023-08-10 MED ORDER — NITROGLYCERIN 0.1 MG/HR TD PT24
0.1000 mg | MEDICATED_PATCH | Freq: Every day | TRANSDERMAL | Status: DC
Start: 2023-08-10 — End: 2023-08-11
  Administered 2023-08-10 – 2023-08-11 (×2): 0.1 mg via TRANSDERMAL
  Filled 2023-08-10 (×2): qty 1

## 2023-08-10 MED ORDER — ONDANSETRON HCL 4 MG/2ML IJ SOLN
4.0000 mg | Freq: Four times a day (QID) | INTRAMUSCULAR | Status: DC | PRN
  Administered 2023-08-13 – 2023-08-16 (×9): 4 mg via INTRAVENOUS
  Filled 2023-08-10 (×4): qty 2

## 2023-08-10 MED ORDER — SPIRONOLACTONE 12.5 MG HALF TABLET
12.5000 mg | ORAL_TABLET | Freq: Every day | ORAL | Status: DC
  Administered 2023-08-11 – 2023-08-12 (×2): 12.5 mg via ORAL
  Filled 2023-08-10 (×3): qty 1

## 2023-08-10 MED ORDER — HEPARIN (PORCINE) 25000 UT/250ML-% IV SOLN
750.0000 [IU]/h | INTRAVENOUS | Status: DC
  Administered 2023-08-10: 750 [IU]/h via INTRAVENOUS
  Administered 2023-08-13 (×2): 650 [IU]/h via INTRAVENOUS
  Administered 2023-08-15 (×2): 750 [IU]/h via INTRAVENOUS
  Filled 2023-08-10 (×4): qty 250

## 2023-08-10 MED ORDER — ALBUTEROL SULFATE (2.5 MG/3ML) 0.083% IN NEBU: 2.5000 mg | INHALATION_SOLUTION | RESPIRATORY_TRACT | Status: DC | PRN

## 2023-08-10 MED ORDER — ATORVASTATIN CALCIUM 80 MG PO TABS
80.0000 mg | ORAL_TABLET | Freq: Every day | ORAL | Status: DC
  Administered 2023-08-11: 80 mg via ORAL
  Filled 2023-08-10: qty 1

## 2023-08-10 MED ORDER — BEMPEDOIC ACID 180 MG PO TABS: 180.0000 mg | ORAL_TABLET | Freq: Every day | ORAL | Status: DC

## 2023-08-10 MED ORDER — ACETAMINOPHEN 325 MG PO TABS: 650.0000 mg | ORAL_TABLET | ORAL | Status: DC | PRN

## 2023-08-10 MED ORDER — ASPIRIN 81 MG PO TBEC
81.0000 mg | DELAYED_RELEASE_TABLET | Freq: Every day | ORAL | Status: DC
  Administered 2023-08-11 – 2023-08-15 (×8): 81 mg via ORAL
  Filled 2023-08-10 (×5): qty 1

## 2023-08-10 MED ORDER — ALBUTEROL SULFATE HFA 108 (90 BASE) MCG/ACT IN AERS: 1.0000 | INHALATION_SPRAY | RESPIRATORY_TRACT | Status: DC | PRN

## 2023-08-10 MED ORDER — PANTOPRAZOLE SODIUM 40 MG PO TBEC
40.0000 mg | DELAYED_RELEASE_TABLET | Freq: Every day | ORAL | Status: DC
  Administered 2023-08-10 – 2023-08-15 (×9): 40 mg via ORAL
  Filled 2023-08-10 (×6): qty 1

## 2023-08-10 MED ORDER — NITROGLYCERIN 0.4 MG SL SUBL: 0.4000 mg | SUBLINGUAL_TABLET | SUBLINGUAL | Status: DC | PRN

## 2023-08-10 NOTE — Progress Notes (Signed)
 PHARMACY - ANTICOAGULATION CONSULT NOTE  Pharmacy Consult for heparin  Indication: chest pain/ACS  Allergies  Allergen Reactions   Aldomet [Methyldopa] Other (See Comments)    Severe hypotension   Brilinta  [Ticagrelor ] Shortness Of Breath   Other Other (See Comments)    Nuclear Stress Test Medication caused seizures   Coumadin  [Warfarin Sodium ] Swelling    Arm swelling   Desyrel [Trazodone] Other (See Comments)    Could not wake up from medication, comatosed   Eliquis [Apixaban] Other (See Comments)    BP got too low   Entresto  [Sacubitril -Valsartan ] Hypertension    Hypotension    Zestril  [Lisinopril ]     Significant blood pressure fluctuations    Penicillins Rash    Has patient had a PCN reaction causing immediate rash, facial/tongue/throat swelling, SOB or lightheadedness with hypotension: Yes Has patient had a PCN reaction causing severe rash involving mucus membranes or skin necrosis: No Has patient had a PCN reaction that required hospitalization: No Has patient had a PCN reaction occurring within the last 10 years: No If all of the above answers are NO, then may proceed with Cephalosporin use.     Patient Measurements: Height: 5' 1 (154.9 cm) Weight: 50.2 kg (110 lb 10.7 oz) IBW/kg (Calculated) : 47.8 HEPARIN  DW (KG): 50.2  Vital Signs: Temp: 98.2 F (36.8 C) (08/08 1948) Temp Source: Oral (08/08 1948) BP: 140/83 (08/08 1948) Pulse Rate: 67 (08/08 1948)  Labs: No results for input(s): HGB, HCT, PLT, APTT, LABPROT, INR, HEPARINUNFRC, HEPRLOWMOCWT, CREATININE, CKTOTAL, CKMB, TROPONINIHS in the last 72 hours.  CrCl cannot be calculated (Patient's most recent lab result is older than the maximum 21 days allowed.).   Medical History: Past Medical History:  Diagnosis Date   Acute myocardial infarction of other lateral wall, initial episode of care 01/2013   DES CFX   Aortoiliac occlusive disease (HCC) 08/22/2016   CAD (coronary artery  disease) 11/13/2011   50% ? proximal LCx stenosis per Cath at [facility]   Chest pain, atypical, muscular skelatal   01/09/2014   COVID 02/08/2020   Dyspnea    Heart failure (HCC)    HLD (hyperlipidemia)    Hypertension    Intermediate coronary syndrome (HCC)    LBBB (left bundle branch block)    Mitral regurgitation    Nonischemic cardiomyopathy (HCC) 01/26/2017   Presence of combination internal cardiac defibrillator (ICD) and pacemaker    PVD (peripheral vascular disease) (HCC)    Right CFA stenosis per report on 11/14/2011   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/25/2016   Edwards McArthy-Adams IMR ETLogix ring annuloplasty (Model 4100, Serial [id number], size 26) placed via right mini thoracotomy approach   Stenosis of left subclavian artery (HCC) 08/22/2016   Stroke (HCC)    tia with no deficits    Medications:  Medications Prior to Admission  Medication Sig Dispense Refill Last Dose/Taking   amLODipine  (NORVASC ) 10 MG tablet Take 1 tablet (10 mg total) by mouth daily. 30 tablet 3    aspirin  EC 81 MG tablet Take 81 mg by mouth daily.      atorvastatin  (LIPITOR ) 80 MG tablet Take 1 tablet (80 mg total) by mouth daily. 90 tablet 3    Bempedoic Acid  (NEXLETOL ) 180 MG TABS Take 1 tablet (180 mg total) by mouth daily. 90 tablet 3    clopidogrel  (PLAVIX ) 75 MG tablet TAKE 1 TABLET BY MOUTH DAILY 90 tablet 3    colchicine  0.6 MG tablet Take 1 tablet (0.6 mg total) by mouth  2 (two) times daily. 60 tablet 2    cyclobenzaprine  (FLEXERIL ) 10 MG tablet Take 1 tablet (10 mg total) by mouth 3 (three) times daily as needed for muscle spasms. 10 tablet 0    ergocalciferol  (VITAMIN D2) 1.25 MG (50000 UT) capsule Take 1 capsule (50,000 Units total) by mouth every Friday. 4 capsule 6    furosemide  (LASIX ) 40 MG tablet Take 1 tablet (40 mg total) by mouth daily as needed for fluid or edema (shortness of breath). 90 tablet 3    losartan  (COZAAR ) 100 MG tablet Take 1 tablet (100 mg total) by mouth  daily. 90 tablet 3    meloxicam (MOBIC) 15 MG tablet Take 15 mg by mouth as needed for pain.      Menthol-Methyl Salicylate (SALONPAS PAIN RELIEF PATCH EX) Apply 1 patch topically daily as needed (pain).      metoprolol  succinate (TOPROL -XL) 50 MG 24 hr tablet Take 2 tablets (100 mg total) by mouth daily. 180 tablet 3    pantoprazole  (PROTONIX ) 40 MG tablet Take 1 tablet (40 mg total) by mouth daily. 90 tablet 3    spironolactone  (ALDACTONE ) 25 MG tablet Take 0.5 tablets (12.5 mg total) by mouth daily. 45 tablet 3    VENTOLIN  HFA 108 (90 Base) MCG/ACT inhaler Inhale 1-2 puffs into the lungs every 4 (four) hours as needed for shortness of breath. 1 each 0    Scheduled:  Infusions:   heparin       Assessment: Pt was tx from [facility] in [place] for 3VCAD. Pending CVTS consult. She was on heparin  there but not during transfer.   She was on heparin  790 units/hr. Last PTT 73 Goal of Therapy:  Heparin  level 0.3-0.7 units/ml Monitor platelets by anticoagulation protocol: Yes   Plan:  Heparin  750units/hr Check 6 hr HL  Daily HL and CBC  Sergio Batch, PharmD, BCIDP, AAHIVP, CPP Infectious Disease Pharmacist 08/10/2023 9:15 PM

## 2023-08-10 NOTE — H&P (Signed)
 Cardiology Admission History and Physical   Patient ID: Lindsay Camacho MRN: [id number]; DOB: 24-Oct-1965   Admission date: 08/10/2023  PCP:  Norrine Sharper, MD   [facility] Providers Cardiologist:  Oneil Parchment, MD  Electrophysiologist:  Danelle Birmingham, MD  PV Cardiologist:  Deatrice Cage, MD     Chief Complaint:  Chest pain  Patient Profile: Lindsay Camacho is a 58 y.o. female with  CAD with prior stenting, chronic HFrEF, ICD, MV repair, HLD, subclavian stenosis with prior stenting, HLD who is being seen 08/10/2023 for the evaluation of chest pain.  History of Present Illness: Lindsay Camacho 58 yo female history of CAD with prior stenting, chronic HFrEF, ICD, MV repair, HLD, subclavian stenosis with prior stenting, HLD, presented to [facility] with chest pain.  From CAD standpoint history of lateral MI in 2015 treated with PCI to LCX. Procedure complicated by retroperitoneal bleeding requiring vascular surgery. Repeat left and right heart cath prior to valve surgery in August 2018 showed patent left circumflex stent with minimal restenosis and mild nonobstructive CAD in the RCA and LAD   Presented to [facility] with chest pains concerning for angina. While in ER episode of chest pain followed by ICD shock. From interrogation at [facility] was in VF with appropriate shock. With chest pains, elevated troponins thought ischemia driven arrhythmia. Had cath in [place] with 3 vessel disease, referred to [facility] for CT surgery evaluation.   12/2022 echo: LVEF 55%, indet diasotlic function, normal RV function, normal MV repair without valve dysfunction, severe TR, mild aortic stenosis.    [place] Data WBC 11.9 Hgb 15.1 Plt 187 Ddimer 0.91 Cr 0.91 Na 141 K 4.1-->3.3 Mg 1.6 LDL 145 A1c 5 TSH 6.40 Free T4 1.32 Trop 1537 BNP 1818    08/10/23 cath [facility]: 3 vessel disease involving the LAD, LCX, RCA. Normal LVEDP.  LM normal, LAD 80-90%, D1 70-80%, LCX 70-80% lesion at stent  edge, OM1 60-70%, RCA prox 20-30% with diffuse haziness in the mid segment which could represent thrombus, distal RCA 99%.  Transferred to [facility] for CABG evaluation.    Past Medical History:  Diagnosis Date   Acute myocardial infarction of other lateral wall, initial episode of care 01/2013   DES CFX   Aortoiliac occlusive disease (HCC) 08/22/2016   CAD (coronary artery disease) 11/13/2011   50% ? proximal LCx stenosis per Cath at [facility]   Chest pain, atypical, muscular skelatal   01/09/2014   COVID 02/08/2020   Dyspnea    Heart failure (HCC)    HLD (hyperlipidemia)    Hypertension    Intermediate coronary syndrome (HCC)    LBBB (left bundle Linder Prajapati block)    Mitral regurgitation    Nonischemic cardiomyopathy (HCC) 01/26/2017   Presence of combination internal cardiac defibrillator (ICD) and pacemaker    PVD (peripheral vascular disease) (HCC)    Right CFA stenosis per report on 11/14/2011   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/25/2016   Edwards McArthy-Adams IMR ETLogix ring annuloplasty (Model 4100, Serial [id number], size 26) placed via right mini thoracotomy approach   Stenosis of left subclavian artery (HCC) 08/22/2016   Stroke (HCC)    tia with no deficits   Past Surgical History:  Procedure Laterality Date   AORTIC ARCH ANGIOGRAPHY N/A 09/21/2021   Procedure: AORTIC ARCH ANGIOGRAPHY;  Surgeon: Cage Deatrice LABOR, MD;  Location: [facility] INVASIVE CV LAB;  Service: Cardiovascular;  Laterality: N/A;   APPENDECTOMY     BIV ICD INSERTION CRT-D N/A  01/24/2017   Procedure: BIV ICD INSERTION CRT-D;  Surgeon: Waddell Danelle ORN, MD;  Location: [facility] INVASIVE CV LAB;  Service: Cardiovascular;  Laterality: N/A;   CARDIAC CATHETERIZATION  04/14/2013   Non-obstructive disease, patent CFX stent   CARDIAC CATHETERIZATION N/A 09/21/2014   Procedure: Left Heart Cath and Coronary Angiography;  Surgeon: Debby DELENA Sor, MD;  Location: [facility] INVASIVE CV LAB;  Service: Cardiovascular;  Laterality: N/A;    CORONARY ANGIOPLASTY WITH STENT PLACEMENT  01/17/2013   mild disease except 99% CFX, rx with  2.5 x 28 Alpine drug-eluting stent    GROIN DEBRIDEMENT Right 04/14/2013   Procedure: Emergency Evacuation of Retroperitoneal Hematoma and Repair Right External Iliac Artery Pseudoaneurysm    ;  Surgeon: Carlin FORBES Haddock, MD;  Location: [facility] OR;  Service: Vascular;  Laterality: Right;   LEFT HEART CATHETERIZATION WITH CORONARY ANGIOGRAM N/A 01/18/2013   Procedure: LEFT HEART CATHETERIZATION WITH CORONARY ANGIOGRAM;  Surgeon: Candyce GORMAN Reek, MD;  Location: [facility] CATH LAB;  Service: Cardiovascular;  Laterality: N/A;   LEFT HEART CATHETERIZATION WITH CORONARY ANGIOGRAM N/A 04/14/2013   Procedure: LEFT HEART CATHETERIZATION WITH CORONARY ANGIOGRAM;  Surgeon: Candyce GORMAN Reek, MD;  Location: [facility] CATH LAB;  Service: Cardiovascular;  Laterality: N/A;   MITRAL VALVE REPAIR Right 08/25/2016   Procedure: MINIMALLY INVASIVE MITRAL VALVE REPAIR;  Surgeon: Dusty Sudie DEL, MD;  Location: [facility] OR;  Service: Open Heart Surgery;  Laterality: Right;   MULTIPLE EXTRACTIONS WITH ALVEOLOPLASTY N/A 08/23/2016   Procedure: Extraction of tooth #'s 17, 20-23, 26-29 with alveoloplasty;  Surgeon: Cyndee Tanda FALCON, DDS;  Location: [facility] OR;  Service: Oral Surgery;  Laterality: N/A;   PERCUTANEOUS CORONARY STENT INTERVENTION (PCI-S)  01/18/2013   Procedure: PERCUTANEOUS CORONARY STENT INTERVENTION (PCI-S);  Surgeon: Candyce GORMAN Reek, MD;  Location: [facility] CATH LAB;  Service: Cardiovascular;;   RIGHT/LEFT HEART CATH AND CORONARY ANGIOGRAPHY N/A 08/21/2016   Procedure: RIGHT/LEFT HEART CATH AND CORONARY ANGIOGRAPHY;  Surgeon: Verlin Lonni BIRCH, MD;  Location: [facility] INVASIVE CV LAB;  Service: Cardiovascular;  Laterality: N/A;   TEE WITHOUT CARDIOVERSION N/A 03/06/2014   Procedure: TRANSESOPHAGEAL ECHOCARDIOGRAM (TEE);  Surgeon: Leim DEL Moose, MD;  Location: [facility] ENDOSCOPY;  Service: Cardiovascular;  Laterality: N/A;   TEE WITHOUT  CARDIOVERSION N/A 08/18/2016   Procedure: TRANSESOPHAGEAL ECHOCARDIOGRAM (TEE);  Surgeon: Pietro Redell GORMAN, MD;  Location: [facility] ENDOSCOPY;  Service: Cardiovascular;  Laterality: N/A;   TEE WITHOUT CARDIOVERSION N/A 08/25/2016   Procedure: TRANSESOPHAGEAL ECHOCARDIOGRAM (TEE);  Surgeon: Dusty Sudie DEL, MD;  Location: [facility] OR;  Service: Open Heart Surgery;  Laterality: N/A;   TUBAL LIGATION     UPPER EXTREMITY INTERVENTION  09/21/2021   Procedure: UPPER EXTREMITY INTERVENTION;  Surgeon: Darron Deatrice DELENA, MD;  Location: [facility] INVASIVE CV LAB;  Service: Cardiovascular;;     Medications Prior to Admission: Prior to Admission medications   Medication Sig Start Date End Date Taking? Authorizing Provider  amLODipine  (NORVASC ) 10 MG tablet Take 1 tablet (10 mg total) by mouth daily. 07/03/23   Ladona Heinz, MD  aspirin  EC 81 MG tablet Take 81 mg by mouth daily.    [provider]  atorvastatin  (LIPITOR ) 80 MG tablet Take 1 tablet (80 mg total) by mouth daily. 09/07/20   Waddell Danelle ORN, MD  Bempedoic Acid  (NEXLETOL ) 180 MG TABS Take 1 tablet (180 mg total) by mouth daily. 07/10/23   West, Katlyn D, NP  clopidogrel  (PLAVIX ) 75 MG tablet TAKE 1 TABLET BY MOUTH DAILY 03/09/23   Jeffrie Oneil BROCKS, MD  colchicine  0.6 MG tablet Take 1 tablet (0.6 mg total) by mouth 2 (two) times daily. 12/31/22   Meng, Hao, PA  cyclobenzaprine  (FLEXERIL ) 10 MG tablet Take 1 tablet (10 mg total) by mouth 3 (three) times daily as needed for muscle spasms. 05/19/22   Vicky Charleston, PA-C  ergocalciferol  (VITAMIN D2) 1.25 MG (50000 UT) capsule Take 1 capsule (50,000 Units total) by mouth every Friday. 05/26/22   Darron Deatrice LABOR, MD  furosemide  (LASIX ) 40 MG tablet Take 1 tablet (40 mg total) by mouth daily as needed for fluid or edema (shortness of breath). 03/05/20   Lesia Ozell Barter, PA-C  losartan  (COZAAR ) 100 MG tablet Take 1 tablet (100 mg total) by mouth daily. 06/11/23   Jeffrie Oneil BROCKS, MD  meloxicam (MOBIC) 15 MG tablet Take  15 mg by mouth as needed for pain. 02/09/23   [provider]  Menthol-Methyl Salicylate (SALONPAS PAIN RELIEF PATCH EX) Apply 1 patch topically daily as needed (pain).    [provider]  metoprolol  succinate (TOPROL -XL) 50 MG 24 hr tablet Take 2 tablets (100 mg total) by mouth daily. 07/10/23   West, Katlyn D, NP  pantoprazole  (PROTONIX ) 40 MG tablet Take 1 tablet (40 mg total) by mouth daily. 02/07/22   Darron Deatrice LABOR, MD  spironolactone  (ALDACTONE ) 25 MG tablet Take 0.5 tablets (12.5 mg total) by mouth daily. 07/10/23   West, Katlyn D, NP  VENTOLIN  HFA 108 (90 Base) MCG/ACT inhaler Inhale 1-2 puffs into the lungs every 4 (four) hours as needed for shortness of breath. 05/22/23   Darron Deatrice LABOR, MD     Allergies:    Allergies  Allergen Reactions   Aldomet [Methyldopa] Other (See Comments)    Severe hypotension   Brilinta  [Ticagrelor ] Shortness Of Breath   Other Other (See Comments)    Nuclear Stress Test Medication caused seizures   Coumadin  [Warfarin Sodium ] Swelling    Arm swelling   Desyrel [Trazodone] Other (See Comments)    Could not wake up from medication, comatosed   Eliquis [Apixaban] Other (See Comments)    BP got too low   Entresto  [Sacubitril -Valsartan ] Hypertension    Hypotension    Zestril  [Lisinopril ]     Significant blood pressure fluctuations    Penicillins Rash    Has patient had a PCN reaction causing immediate rash, facial/tongue/throat swelling, SOB or lightheadedness with hypotension: Yes Has patient had a PCN reaction causing severe rash involving mucus membranes or skin necrosis: No Has patient had a PCN reaction that required hospitalization: No Has patient had a PCN reaction occurring within the last 10 years: No If all of the above answers are NO, then may proceed with Cephalosporin use.     Social History:   Social History   Socioeconomic History   Marital status: Single    Spouse name: Not on file   Number of children: Not on  file   Years of education: Not on file   Highest education level: Not on file  Occupational History   Not on file  Tobacco Use   Smoking status: Every Day    Current packs/day: 0.50    Types: Cigarettes   Smokeless tobacco: Never  Vaping Use   Vaping status: Never Used  Substance and Sexual Activity   Alcohol  use: No    Alcohol /week: 0.0 standard drinks of alcohol    Drug use: Not Currently    Types: Marijuana   Sexual activity: Yes    Birth control/protection: None  Other Topics  Concern   Not on file  Social History Narrative   Not on file   Social Drivers of Health   Financial Resource Strain: Not on file  Food Insecurity: No Food Insecurity (08/10/2023)   Hunger Vital Sign    Worried About Running Out of Food in the Last Year: Never true    Ran Out of Food in the Last Year: Never true  Transportation Needs: No Transportation Needs (08/10/2023)   PRAPARE - Administrator, Civil Service (Medical): No    Lack of Transportation (Non-Medical): No  Physical Activity: Not on file  Stress: Not on file  Social Connections: Unknown (08/10/2023)   Social Connection and Isolation Panel    Frequency of Communication with Friends and Family: Three times a week    Frequency of Social Gatherings with Friends and Family: Twice a week    Attends Religious Services: Not on Marketing executive or Organizations: Not on file    Attends Banker Meetings: Not on file    Marital Status: Not on file  Intimate Partner Violence: Not At Risk (08/10/2023)   Humiliation, Afraid, Rape, and Kick questionnaire    Fear of Current or Ex-Partner: No    Emotionally Abused: No    Physically Abused: No    Sexually Abused: No     Family History:   The patient's family history includes Bladder Cancer in her mother; CAD (age of onset: 89) in her mother; CAD (age of onset: 9) in her father; Heart attack in her father and mother; Heart disease in her father and mother;  Hypertension in her mother; Lung cancer in her mother; Stroke in her father and mother.    ROS:  Please see the history of present illness.  All other ROS reviewed and negative.     Physical Exam/Data: Vitals:   08/10/23 1700 08/10/23 1749 08/10/23 1803 08/10/23 1948  BP:  125/71 113/74 (!) 140/83  Pulse:  68 63 67  Resp:    16  Temp:  98.3 F (36.8 C)  98.2 F (36.8 C)  TempSrc:    Oral  SpO2:  93% 95% 96%  Weight: 50.2 kg     Height: 5' 1 (1.549 m)      No intake or output data in the 24 hours ending 08/10/23 2051    08/10/2023    5:00 PM 07/10/2023    1:59 PM 05/22/2023    4:21 PM  Last 3 Weights  Weight (lbs) 110 lb 10.7 oz 109 lb 6.4 oz 107 lb  Weight (kg) 50.2 kg 49.624 kg 48.535 kg     Body mass index is 20.91 kg/m.  General:  Well nourished, well developed, in no acute distress HEENT: normal Neck: no JVD Vascular: No carotid bruits; Distal pulses 2+ bilaterally   Cardiac:  RRR, 2/6 systolic murmur rusb, no jvd Lungs:  clear to auscultation bilaterally, no wheezing, rhonchi or rales  Abd: soft, nontender, no hepatomegaly  Ext: no edema Musculoskeletal:  No deformities, BUE and BLE strength normal and equal Skin: warm and dry  Neuro:  CNs 2-12 intact, no focal abnormalities noted Psych:  Normal affect     Laboratory Data: High Sensitivity Troponin:  No results for input(s): TROPONINIHS in the last 720 hours.    ChemistryNo results for input(s): NA, K, CL, CO2, GLUCOSE, BUN, CREATININE, CALCIUM , MG, GFRNONAA, GFRAA, ANIONGAP in the last 168 hours.  No results for input(s): PROT, ALBUMIN , AST, ALT, ALKPHOS,  BILITOT in the last 168 hours. Lipids No results for input(s): CHOL, TRIG, HDL, LABVLDL, LDLCALC, CHOLHDL in the last 168 hours. HematologyNo results for input(s): WBC, RBC, HGB, HCT, MCV, MCH, MCHC, RDW, PLT in the last 168 hours. Thyroid No results for input(s): TSH, FREET4 in the last  168 hours. BNPNo results for input(s): BNP, PROBNP in the last 168 hours.  DDimer No results for input(s): DDIMER in the last 168 hours.  Radiology/Studies:  No results found.   Assessment and Plan:  1. CAD - history of prior stenting as listed above - admitted initially to [facility] with NSTEMI. While in ER ICD shock for VF that as appropriate and successful.  -  08/10/23 cath [facility]: 3 vessel disease involving the LAD, LCX, RCA. Normal LVEDP.  LM normal, LAD 80-90%, D1 70-80%, LCX 70-80% lesion at stent edge, OM1 60-70%, RCA prox 20-30% with diffuse haziness in the mid segment which could represent thrombus, distal RCA 99%.  Transferred to [facility] for CABG evaluation. Unfortunately cath films are not included in records.   -medical therapy for NSTEMI with hep gtt, ASA, coreg , atorva 80mg . Of note on plavix  at home, last dose 8/8 at 1156 AM - order echo - obtain cath films and then consult CT surgery    2.ICD shock - interrogation of device at [facility] 08/09/2023 at 1327 VF event which she appropriately defibrillated.  - in setting of NSTEMI, 3 vessel CAD - keep K >4, Mg >2. Management for NSTEMI as listed above.    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  - repeat echo  4.HLD - she is on statin.  - notes indicate side effects on [id number] - was to try nexletol  as outpatient.   5. History of subclavian stenosis and stenting   Risk Assessment/Risk Scores:   TIMI Risk Score for Unstable Angina or Non-ST Elevation MI:   The patient's TIMI risk score is 5, which indicates a 26% risk of all cause mortality, new or recurrent myocardial infarction or need for urgent revascularization in the next 14 days.{  Severity of Illness: The appropriate patient status for this patient is  INPATIENT. Inpatient status is judged to be reasonable and necessary in order to provide the required intensity of service to ensure the patient's safety. The patient's presenting symptoms, physical exam findings, and initial radiographic and laboratory data in the context of their chronic comorbidities is felt to place them at high risk for further clinical deterioration. Furthermore, it is not anticipated that the patient will be medically stable for discharge from the hospital within 2 midnights of admission.   * I certify that at the point of admission it is my clinical judgment that the patient will require inpatient hospital care spanning beyond 2 midnights from the point of admission due to high intensity of service, high risk for further deterioration and high frequency of surveillance required.*  For questions or updates, please contact [facility] Please consult [email or web address] for contact info under     Signed, Alvan Carrier, MD  08/10/2023 8:51 PM

## 2023-08-11 DIAGNOSIS — I214 Non-ST elevation (NSTEMI) myocardial infarction: Secondary | ICD-10-CM | POA: Diagnosis not present

## 2023-08-11 LAB — BASIC METABOLIC PANEL WITH GFR
Anion gap: 8 (ref 5–15)
BUN: 11 mg/dL (ref 6–20)
CO2: 21 mmol/L — ABNORMAL LOW (ref 22–32)
Calcium: 9.3 mg/dL (ref 8.9–10.3)
Chloride: 108 mmol/L (ref 98–111)
Creatinine, Ser: 0.85 mg/dL (ref 0.44–1.00)
GFR, Estimated: >60 mL/min (ref >60)
Glucose, Bld: 126 mg/dL — ABNORMAL HIGH (ref 70–99)
Potassium: 4 mmol/L (ref 3.5–5.1)
Sodium: 137 mmol/L (ref 135–145)

## 2023-08-11 LAB — CBC
HCT: 39.8 % (ref 36.0–46.0)
Hemoglobin: 13.1 g/dL (ref 12.0–15.0)
MCH: 28.9 pg (ref 26.0–34.0)
MCHC: 32.9 g/dL (ref 30.0–36.0)
MCV: 87.9 fL (ref 80.0–100.0)
Platelets: 188 K/uL (ref 150–400)
RBC: 4.53 MIL/uL (ref 3.87–5.11)
RDW: 14.4 % (ref 11.5–15.5)
WBC: 15.1 K/uL — ABNORMAL HIGH (ref 4.0–10.5)
nRBC: 0 % (ref 0.0–0.2)

## 2023-08-11 LAB — LIPID PANEL
Cholesterol: 221 mg/dL — ABNORMAL HIGH (ref 0–200)
HDL: 49 mg/dL (ref >40)
LDL Cholesterol: 162 mg/dL — ABNORMAL HIGH (ref 0–99)
Total CHOL/HDL Ratio: 4.5 ratio
Triglycerides: 51 mg/dL (ref <150)
VLDL: 10 mg/dL (ref 0–40)

## 2023-08-11 LAB — HEPARIN LEVEL (UNFRACTIONATED)
Heparin Unfractionated: 0.4 [IU]/mL (ref 0.30–0.70)
Heparin Unfractionated: 0.35 [IU]/mL (ref 0.30–0.70)

## 2023-08-11 LAB — MAGNESIUM: Magnesium: 1.7 mg/dL (ref 1.7–2.4)

## 2023-08-11 MED ORDER — NITROGLYCERIN IN D5W 200-5 MCG/ML-% IV SOLN
INTRAVENOUS | Status: AC
  Administered 2023-08-11: 5 ug/min via INTRAVENOUS
  Filled 2023-08-11: qty 250

## 2023-08-11 MED ORDER — NITROGLYCERIN IN D5W 200-5 MCG/ML-% IV SOLN
2.0000 ug/min | INTRAVENOUS | Status: DC
  Administered 2023-08-13: 40 ug/min via INTRAVENOUS
  Administered 2023-08-13 (×2): 50 ug/min via INTRAVENOUS
  Administered 2023-08-13: 40 ug/min via INTRAVENOUS
  Administered 2023-08-14 (×2): 5 ug/min via INTRAVENOUS
  Administered 2023-08-16: 15 ug/min via INTRAVENOUS
  Filled 2023-08-11 (×3): qty 250

## 2023-08-11 MED ORDER — ACETAMINOPHEN 500 MG PO TABS
1000.0000 mg | ORAL_TABLET | Freq: Three times a day (TID) | ORAL | Status: DC
  Administered 2023-08-11 – 2023-08-15 (×14): 1000 mg via ORAL
  Filled 2023-08-11 (×12): qty 2

## 2023-08-11 MED ORDER — LIDOCAINE 5 % EX PTCH
1.0000 | MEDICATED_PATCH | CUTANEOUS | Status: DC
  Administered 2023-08-11 – 2023-08-15 (×8): 1 via TRANSDERMAL
  Filled 2023-08-11 (×5): qty 1

## 2023-08-11 MED ORDER — ROSUVASTATIN CALCIUM 20 MG PO TABS
40.0000 mg | ORAL_TABLET | Freq: Every day | ORAL | Status: DC
  Administered 2023-08-12 – 2023-08-23 (×14): 40 mg via ORAL
  Filled 2023-08-11 (×11): qty 2

## 2023-08-11 MED ORDER — EZETIMIBE 10 MG PO TABS
10.0000 mg | ORAL_TABLET | Freq: Every day | ORAL | Status: DC
  Administered 2023-08-11 – 2023-08-23 (×15): 10 mg via ORAL
  Filled 2023-08-11 (×11): qty 1

## 2023-08-11 MED ORDER — MORPHINE SULFATE (PF) 2 MG/ML IV SOLN
2.0000 mg | INTRAVENOUS | Status: DC | PRN
  Administered 2023-08-11 – 2023-08-16 (×34): 2 mg via INTRAVENOUS
  Filled 2023-08-11 (×22): qty 1

## 2023-08-11 MED ORDER — MAGNESIUM SULFATE 2 GM/50ML IV SOLN
2.0000 g | Freq: Once | INTRAVENOUS | Status: AC
  Administered 2023-08-11: 2 g via INTRAVENOUS
  Filled 2023-08-11: qty 50

## 2023-08-11 NOTE — Plan of Care (Signed)

## 2023-08-11 NOTE — Progress Notes (Signed)
 Seen and examined by Dr. Floretta.

## 2023-08-11 NOTE — Plan of Care (Signed)

## 2023-08-11 NOTE — Progress Notes (Addendum)
 PHARMACY - ANTICOAGULATION CONSULT NOTE  Pharmacy Consult for heparin  Indication: chest pain/ACS  Allergies  Allergen Reactions   Aldomet [Methyldopa] Other (See Comments)    Severe hypotension   Brilinta  [Ticagrelor ] Shortness Of Breath   Other Other (See Comments)    Nuclear Stress Test Medication caused seizures   Coumadin  [Warfarin Sodium ] Swelling    Arm swelling   Desyrel [Trazodone] Other (See Comments)    Could not wake up from medication, comatosed   Eliquis [Apixaban] Other (See Comments)    BP got too low   Entresto  [Sacubitril -Valsartan ] Hypertension    Hypotension    Zestril  [Lisinopril ]     Significant blood pressure fluctuations    Penicillins Rash    Has patient had a PCN reaction causing immediate rash, facial/tongue/throat swelling, SOB or lightheadedness with hypotension: Yes Has patient had a PCN reaction causing severe rash involving mucus membranes or skin necrosis: No Has patient had a PCN reaction that required hospitalization: No Has patient had a PCN reaction occurring within the last 10 years: No If all of the above answers are NO, then may proceed with Cephalosporin use.    Patient Measurements: Height: 5' 1 (154.9 cm) Weight: 50.2 kg (110 lb 10.7 oz) IBW/kg (Calculated) : 47.8 HEPARIN  DW (KG): 50.2  Vital Signs: Temp: 97.9 F (36.6 C) (08/09 0544) Temp Source: Oral (08/09 0544) BP: 126/71 (08/09 0544) Pulse Rate: 58 (08/09 0544)  Labs: Recent Labs    08/10/23 2236 08/11/23 0445  HGB 14.4  --   HCT 44.2  --   PLT 185  --   HEPARINUNFRC  --  0.40  CREATININE 0.85 0.85   Estimated Creatinine Clearance: 54.4 mL/min (by C-G formula based on SCr of 0.85 mg/dL).  Medical History: Past Medical History:  Diagnosis Date   Acute myocardial infarction of other lateral wall, initial episode of care 01/2013   DES CFX   Aortoiliac occlusive disease (HCC) 08/22/2016   CAD (coronary artery disease) 11/13/2011   50% ? 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   Edwards McArthy-Adams IMR ETLogix ring annuloplasty (Model 4100, Serial [id number], size 26) placed via right mini thoracotomy approach   Stenosis of left subclavian artery (HCC) 08/22/2016   Stroke (HCC)    tia with no deficits   Medications:   Scheduled:   aspirin  EC  81 mg Oral Daily   atorvastatin   80 mg Oral Daily   Bempedoic Acid   180 mg Oral Daily   carvedilol   3.125 mg Oral BID WC   losartan   100 mg Oral Daily   nitroGLYCERIN   0.1 mg Transdermal Daily   pantoprazole   40 mg Oral Daily   spironolactone   12.5 mg Oral Daily   Infusions:   heparin  750 Units/hr (08/10/23 2130)   Assessment: 58 YO F transferred from [facility] in [place] presenting with NSTEMI. Patient transferred to [facility] for CABG evaluation; planning for CT surgery consult pending  cath films. Patient was not on anticoagulation prior to admission. Pharmacy has been consulted to dose IV heparin  for NSTEMI.   Patient previously therapeutic on heparin  790 units/h at [facility] with aPTT 73, although had not received heparin  during transfer. Heparin  750 units/h was initiated 8/8 at 2130. Hgb and plt are WNL. No signs/symptoms of bleeding or issues with heparin  infusion documented.   Heparin  level 8/9 AM was therapeutic at 0.40.   8/9 PM Update: 2nd heparin  level at 8/9 1400 was therapeutic.  Heparin  level 0.3-0.7 units/ml Monitor platelets by anticoagulation protocol: Yes    Plan:  Continue Heparin  750units/hr Monitor HL, CBC, s/sx bleeding daily  Maurilio Patten, PharmD PGY1 Pharmacy Resident [facility] 08/11/2023 7:16 AM

## 2023-08-11 NOTE — Significant Event (Signed)
 I was notified that the patient is having ongoing chest pain. 7/10 in intensity. Left sided and described as being pressure-like in quality. Partly alleviated by morphine , but with recurrence. I examined the patient. She has tenderness to palpation along the LLSB that reproduces her pain. She appears very comfortable laying in bed and is hemodynamically stable.  I suspect that her chest pain is a combination of ischemic and MSK-related chest pain. I transitioned her from a nitroglycerin  patch to a gtt for titration. Will also schedule tylenol  and administer a lidocaine  patch to her chest.   Dr. Georganna Archer II, MD

## 2023-08-11 NOTE — Progress Notes (Addendum)
 Rounding Note   Patient Name: Lindsay Camacho Date of Encounter: 08/11/2023  [facility] Cardiologist: Oneil Parchment, MD   Subjective Some left sided chest pain tender to palpation  Scheduled Meds:  aspirin  EC  81 mg Oral Daily   atorvastatin   80 mg Oral Daily   Bempedoic Acid   180 mg Oral Daily   carvedilol   3.125 mg Oral BID WC   losartan   100 mg Oral Daily   nitroGLYCERIN   0.1 mg Transdermal Daily   pantoprazole   40 mg Oral Daily   spironolactone   12.5 mg Oral Daily   Continuous Infusions:  heparin  750 Units/hr (08/11/23 0720)   PRN Meds: acetaminophen , albuterol , nitroGLYCERIN , ondansetron  (ZOFRAN ) IV   Vital Signs  Vitals:   08/10/23 2237 08/10/23 2357 08/11/23 0544 08/11/23 0728  BP: 128/74 114/74 126/71 (!) 135/58  Pulse: 62 64 (!) 58 65  Resp:  16 16 16   Temp:  98.1 F (36.7 C) 97.9 F (36.6 C) 98.3 F (36.8 C)  TempSrc:  Oral Oral Oral  SpO2:  96% 99% 100%  Weight:      Height:        Intake/Output Summary (Last 24 hours) at 08/11/2023 1022 Last data filed at 08/11/2023 0803 Gross per 24 hour  Intake 304.33 ml  Output 0 ml  Net 304.33 ml      08/10/2023    5:00 PM 07/10/2023    1:59 PM 05/22/2023    4:21 PM  Last 3 Weights  Weight (lbs) 110 lb 10.7 oz 109 lb 6.4 oz 107 lb  Weight (kg) 50.2 kg 49.624 kg 48.535 kg      Telemetry NSR - Personally Reviewed  ECG  N/a - Personally Reviewed  Physical Exam  GEN: No acute distress.   Neck: No JVD Cardiac: RRR, no murmurs, rubs, or gallops.  Respiratory: Clear to auscultation bilaterally. GI: Soft, nontender, non-distended  MS: No edema; No deformity. Neuro:  Nonfocal  Psych: Normal affect   Labs High Sensitivity Troponin:  No results for input(s): TROPONINIHS in the last 720 hours.   Chemistry Recent Labs  Lab 08/10/23 2236 08/11/23 0445  NA 138 137  K 3.7 4.0  CL 109 108  CO2 20* 21*  GLUCOSE 141* 126*  BUN 12 11  CREATININE 0.85 0.85  CALCIUM  8.9 9.3  PROT 6.1*  --    ALBUMIN  3.0*  --   AST 25  --   ALT 13  --   ALKPHOS 71  --   BILITOT 1.1  --   GFRNONAA >60 >60  ANIONGAP 9 8    Lipids  Recent Labs  Lab 08/11/23 0445  CHOL 221*  TRIG 51  HDL 49  LDLCALC 162*  CHOLHDL 4.5    Hematology Recent Labs  Lab 08/10/23 2236  WBC 7.5  RBC 5.04  HGB 14.4  HCT 44.2  MCV 87.7  MCH 28.6  MCHC 32.6  RDW 14.3  PLT 185   Thyroid No results for input(s): TSH, FREET4 in the last 168 hours.  BNPNo results for input(s): BNP, PROBNP in the last 168 hours.  DDimer No results for input(s): DDIMER in the last 168 hours.   Radiology  No results found.    Assessment & Plan   1. CAD/NSTEMI -history of lateral MI in 2015 treated with PCI to LCX. Procedure complicated by retroperitoneal bleeding requiring vascular surgery. Repeat left and right heart cath prior to valve surgery in August 2018 showed patent left circumflex stent with minimal  restenosis and mild nonobstructive CAD in the RCA and LAD     - admitted initially to [facility] with NSTEMI. While in ER ICD shock for VF that as appropriate and successful.  -  08/10/23 cath [facility]: 3 vessel disease involving the LAD, LCX, RCA. Normal LVEDP.  LM normal, LAD 80-90%, D1 70-80%, LCX 70-80% lesion at stent edge, OM1 60-70%, RCA prox 20-30% with diffuse haziness in the mid segment which could represent thrombus, distal RCA 99%.  Transferred to [facility] for CABG evaluation. Unfortunately cath films are not included in records, we are working to obtain   -medical therapy for NSTEMI with hep gtt, ASA, coreg  3.125mg  bid, atorva 80mg . Of note on plavix  at home, last dose 8/8 at 1156 AM. On hold at this time - echo is pending - obtain cath films and obtain echo, then consult CT surgery  - some left sided chest pain tender to palpiation, atypical this morning. May be some related soreness from ICD shock, not typical of angina.      2.ICD shock - interrogation of device at [facility] 08/09/2023 at  1327 VF event which she appropriately defibrillated.  - in setting of NSTEMI, 3 vessel CAD - keep K >4, Mg >2. Management for NSTEMI as listed above.      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  - repeat echo pending   4.HLD - she is on atorvastatin  80mg  daily. Seen in lipid clinic previously - notes indicate side effects on pcsk9i. Reported side effects on leqvio  as well - was to be on nexletol  as outpatient, continue as inpatient bempedoic acid  and zetia  - change atorva to crestor  40mg  daily.  - follow up again in lipid clinic   5. History of subclavian stenosis and stenting      For questions or updates, please contact [facility] Please consult [email or web address] for contact info under     Signed, Alvan Carrier, MD  08/11/2023, 10:22 AM  \

## 2023-08-12 ENCOUNTER — Inpatient Hospital Stay (HOSPITAL_COMMUNITY)

## 2023-08-12 ENCOUNTER — Other Ambulatory Visit: Payer: Self-pay

## 2023-08-12 ENCOUNTER — Telehealth (HOSPITAL_COMMUNITY): Payer: Self-pay | Admitting: Pharmacist

## 2023-08-12 DIAGNOSIS — I2510 Atherosclerotic heart disease of native coronary artery without angina pectoris: Secondary | ICD-10-CM | POA: Diagnosis not present

## 2023-08-12 DIAGNOSIS — I214 Non-ST elevation (NSTEMI) myocardial infarction: Secondary | ICD-10-CM | POA: Diagnosis not present

## 2023-08-12 LAB — BASIC METABOLIC PANEL WITH GFR
Anion gap: 9 (ref 5–15)
BUN: 27 mg/dL — ABNORMAL HIGH (ref 6–20)
CO2: 21 mmol/L — ABNORMAL LOW (ref 22–32)
Calcium: 8.8 mg/dL — ABNORMAL LOW (ref 8.9–10.3)
Chloride: 109 mmol/L (ref 98–111)
Creatinine, Ser: 1.21 mg/dL — ABNORMAL HIGH (ref 0.44–1.00)
GFR, Estimated: 52 mL/min — ABNORMAL LOW (ref >60)
Glucose, Bld: 97 mg/dL (ref 70–99)
Potassium: 4 mmol/L (ref 3.5–5.1)
Sodium: 139 mmol/L (ref 135–145)

## 2023-08-12 LAB — CBC
HCT: 40.9 % (ref 36.0–46.0)
Hemoglobin: 13.3 g/dL (ref 12.0–15.0)
MCH: 28.9 pg (ref 26.0–34.0)
MCHC: 32.5 g/dL (ref 30.0–36.0)
MCV: 88.7 fL (ref 80.0–100.0)
Platelets: 141 K/uL — ABNORMAL LOW (ref 150–400)
RBC: 4.61 MIL/uL (ref 3.87–5.11)
RDW: 14.6 % (ref 11.5–15.5)
WBC: 10.2 K/uL (ref 4.0–10.5)
nRBC: 0 % (ref 0.0–0.2)

## 2023-08-12 LAB — ECHOCARDIOGRAM COMPLETE
AR max vel: 1.57 cm2
AV Area VTI: 1.7 cm2
AV Area mean vel: 1.69 cm2
AV Mean grad: 6.2 mmHg
AV Peak grad: 14.9 mmHg
Ao pk vel: 1.93 m/s
Height: 61 in
P 1/2 time: 666 ms
S' Lateral: 2.9 cm
Single Plane A4C EF: 42.7 %
Weight: 1770.73 [oz_av]

## 2023-08-12 LAB — LIPOPROTEIN A (LPA): Lipoprotein (a): 209.2 nmol/L — ABNORMAL HIGH (ref <75.0)

## 2023-08-12 LAB — HEPARIN LEVEL (UNFRACTIONATED)
Heparin Unfractionated: 0.71 [IU]/mL — ABNORMAL HIGH (ref 0.30–0.70)
Heparin Unfractionated: 0.53 [IU]/mL (ref 0.30–0.70)

## 2023-08-12 NOTE — Plan of Care (Signed)
  Problem: Pain Managment: Goal: General experience of comfort will improve and/or be controlled Outcome: Progressing   Problem: Activity: Goal: Ability to tolerate increased activity will improve Outcome: Progressing   Problem: Cardiac: Goal: Ability to achieve and maintain adequate cardiovascular perfusion will improve Outcome: Progressing

## 2023-08-12 NOTE — Progress Notes (Signed)
  Echocardiogram 2D Echocardiogram has been performed.  Lindsay Camacho 08/12/2023, 12:05 PM

## 2023-08-12 NOTE — Progress Notes (Signed)
 Rounding Note   Patient Name: Lindsay Camacho Date of Encounter: 08/12/2023  [facility] Cardiologist: Oneil Parchment, MD   Subjective Some chest pains overnight  Scheduled Meds:  acetaminophen   1,000 mg Oral Q8H   aspirin  EC  81 mg Oral Daily   carvedilol   3.125 mg Oral BID WC   ezetimibe   10 mg Oral Daily   lidocaine   1 patch Transdermal Q24H   losartan   100 mg Oral Daily   pantoprazole   40 mg Oral Daily   rosuvastatin   40 mg Oral Daily   spironolactone   12.5 mg Oral Daily   Continuous Infusions:  heparin  650 Units/hr (08/12/23 0819)   nitroGLYCERIN  30 mcg/min (08/12/23 0818)   PRN Meds: albuterol , morphine  injection, ondansetron  (ZOFRAN ) IV   Vital Signs  Vitals:   08/12/23 0323 08/12/23 0726 08/12/23 0815 08/12/23 0920  BP: 129/64 119/70  115/66  Pulse: (!) 55 (!) 57 61 (!) 110  Resp: 16 16    Temp: (!) 97.4 F (36.3 C) 97.6 F (36.4 C)    TempSrc: Oral Oral    SpO2: 96% 98%  100%  Weight:      Height:        Intake/Output Summary (Last 24 hours) at 08/12/2023 0952 Last data filed at 08/12/2023 0655 Gross per 24 hour  Intake 605.25 ml  Output 500 ml  Net 105.25 ml      08/10/2023    5:00 PM 07/10/2023    1:59 PM 05/22/2023    4:21 PM  Last 3 Weights  Weight (lbs) 110 lb 10.7 oz 109 lb 6.4 oz 107 lb  Weight (kg) 50.2 kg 49.624 kg 48.535 kg      Telemetry A sensed, V paced - Personally Reviewed  ECG  N/a - Personally Reviewed  Physical Exam  GEN: No acute distress.   Neck: No JVD Cardiac: RRR, no murmurs, rubs, or gallops.  Respiratory: Clear to auscultation bilaterally. GI: Soft, nontender, non-distended  MS: No edema; No deformity. Neuro:  Nonfocal  Psych: Normal affect   Labs High Sensitivity Troponin:  No results for input(s): TROPONINIHS in the last 720 hours.   Chemistry Recent Labs  Lab 08/10/23 2236 08/11/23 0445 08/11/23 1412 08/12/23 0412  NA 138 137  --  139  K 3.7 4.0  --  4.0  CL 109 108  --  109  CO2 20* 21*   --  21*  GLUCOSE 141* 126*  --  97  BUN 12 11  --  27*  CREATININE 0.85 0.85  --  1.21*  CALCIUM  8.9 9.3  --  8.8*  MG  --   --  1.7  --   PROT 6.1*  --   --   --   ALBUMIN  3.0*  --   --   --   AST 25  --   --   --   ALT 13  --   --   --   ALKPHOS 71  --   --   --   BILITOT 1.1  --   --   --   GFRNONAA >60 >60  --  52*  ANIONGAP 9 8  --  9    Lipids  Recent Labs  Lab 08/11/23 0445  CHOL 221*  TRIG 51  HDL 49  LDLCALC 162*  CHOLHDL 4.5    Hematology Recent Labs  Lab 08/10/23 2236 08/11/23 1412 08/12/23 0412  WBC 7.5 15.1* 10.2  RBC 5.04 4.53 4.61  HGB 14.4  13.1 13.3  HCT 44.2 39.8 40.9  MCV 87.7 87.9 88.7  MCH 28.6 28.9 28.9  MCHC 32.6 32.9 32.5  RDW 14.3 14.4 14.6  PLT 185 188 141*   Thyroid No results for input(s): TSH, FREET4 in the last 168 hours.  BNPNo results for input(s): BNP, PROBNP in the last 168 hours.  DDimer No results for input(s): DDIMER in the last 168 hours.   Radiology  No results found.  Cardiac Studies   Patient Profile   Lindsay Camacho is a 58 y.o. female with  CAD with prior stenting, chronic HFrEF, ICD, MV repair, HLD, subclavian stenosis with prior stenting, HLD who is being seen 08/10/2023 for the evaluation of chest pain.   Assessment & Plan  1. CAD/NSTEMI -history of lateral MI in 2015 treated with PCI to LCX. Procedure complicated by retroperitoneal bleeding requiring vascular surgery. Repeat left and right heart cath prior to valve surgery in August 2018 showed patent left circumflex stent with minimal restenosis and mild nonobstructive CAD in the RCA and LAD       - admitted initially to [facility] with NSTEMI. While in ER ICD shock for VF that as appropriate and successful.  -  08/10/23 cath [facility]: 3 vessel disease involving the LAD, LCX, RCA. Normal LVEDP.  LM normal, LAD 80-90%, D1 70-80%, LCX 70-80% lesion at stent edge, OM1 60-70%, RCA prox 20-30% with diffuse haziness in the mid segment which could represent  thrombus, distal RCA 99%.  Transferred to [facility] for CABG evaluation. Unfortunately cath films are not included in records   -medical therapy for NSTEMI with hep gtt, ASA, coreg  3.125mg  bid, atorva 80mg . Of note on plavix  at home, last dose 8/8 at 1156 AM. On hold at this time - chest pain overnight, started on NG gtt. Left chest wall pain tender with palpation, atypical - echo is pending - we touched based with CT surgery yesterday. formally consult after echo, they will need help accessing powershare to review films unless can have uploaded to epic on Monday. PA Rollo Louder helped secure access to the films on powershare. Patient requires plavix  washout as well which delays her timing.      2.ICD shock - interrogation of device at [facility] 08/09/2023 at 1327 VF event which she appropriately defibrillated.  - in setting of NSTEMI, 3 vessel CAD - keep K >4, Mg >2. Management for NSTEMI as listed above.      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  - repeat echo pending   4.HLD - she is on atorvastatin  80mg  daily. Seen in lipid clinic previously - notes indicate side effects on pcsk9i. Reported side effects on leqvio  as well - was to be on nexletol  as outpatient. Per pharmacy do not have bempedoic acid  on formulary, she is on zetia  10mg  daily.  - changed atorva to crestor  40mg  daily.  - follow up again in lipid clinic     5. History of subclavian stenosis and stenting     For questions or updates, please contact [facility] Please consult [email or web address] for contact info under     Signed, Alvan Carrier, MD  08/12/2023, 9:52 AM

## 2023-08-12 NOTE — Progress Notes (Addendum)
 PHARMACY - ANTICOAGULATION CONSULT NOTE  Pharmacy Consult for heparin  Indication: chest pain/ACS  Allergies  Allergen Reactions   Aldomet [Methyldopa] Other (See Comments)    Severe hypotension   Brilinta  [Ticagrelor ] Shortness Of Breath   Other Other (See Comments)    Nuclear Stress Test Medication caused seizures   Coumadin  [Warfarin Sodium ] Swelling    Arm swelling   Desyrel [Trazodone] Other (See Comments)    Could not wake up from medication, comatosed   Eliquis [Apixaban] Other (See Comments)    BP got too low   Entresto  [Sacubitril -Valsartan ] Hypertension    Hypotension    Zestril  [Lisinopril ]     Significant blood pressure fluctuations    Penicillins Rash   Patient Measurements: Height: 5' 1 (154.9 cm) Weight: 50.2 kg (110 lb 10.7 oz) IBW/kg (Calculated) : 47.8 HEPARIN  DW (KG): 50.2  Vital Signs: Temp: 97.4 F (36.3 C) (08/10 0323) Temp Source: Oral (08/10 0323) BP: 129/64 (08/10 0323) Pulse Rate: 55 (08/10 0323)  Labs: Recent Labs    08/10/23 2236 08/11/23 0445 08/11/23 1412 08/12/23 0412  HGB 14.4  --  13.1 13.3  HCT 44.2  --  39.8 40.9  PLT 185  --  188 141*  HEPARINUNFRC  --  0.40 0.35 0.71*  CREATININE 0.85 0.85  --  1.21*   Estimated Creatinine Clearance: 38.2 mL/min (A) (by C-G formula based on SCr of 1.21 mg/dL (H)).  Medical History: CAD, HFrEF, ICD, MV repair, HLD, subclavian stenosis with prior stenting, HLD.   Medications:  Scheduled:   acetaminophen   1,000 mg Oral Q8H   aspirin  EC  81 mg Oral Daily   carvedilol   3.125 mg Oral BID WC   ezetimibe   10 mg Oral Daily   lidocaine   1 patch Transdermal Q24H   losartan   100 mg Oral Daily   pantoprazole   40 mg Oral Daily   rosuvastatin   40 mg Oral Daily   spironolactone   12.5 mg Oral Daily   Infusions:   heparin  750 Units/hr (08/12/23 0655)   nitroGLYCERIN  25 mcg/min (08/12/23 9344)   Assessment: 58 YO F transferred from [facility] in [place] presenting with NSTEMI. Patient  transferred to [facility] for CABG evaluation; planning for CT surgery consult pending cath films. Patient was not on anticoagulation prior to admission. Pharmacy has been consulted to dose IV heparin  for NSTEMI.   Hgb WNL; Plt just slightly low at 141. No signs/symptoms of bleeding documented.   8/10 AM: Heparin  level this morning was supratherapeutic at 0.71 on rate of 750 units/h. Verified with overnight RN that heparin  level was drawn from right arm whereas heparin  infusion was running from left arm, and that heparin  has been running at 750 units/h without issues. Therefore, this is likely a true supratherapeutic level.  8/10 PM Addendum: Heparin  level therapeutic at 0.53 on infusion of 650 units/h.  Heparin  level 0.3-0.7 units/ml Monitor platelets by anticoagulation protocol: Yes   Plan:  Continue heparin  at 650 units/h.  Check next heparin  level with daily AM labs.  Monitor HL, CBC, s/sx bleeding daily.   Maurilio Patten, PharmD PGY1 Pharmacy Resident [facility] 08/12/2023 7:36 AM

## 2023-08-12 NOTE — Progress Notes (Signed)
 Patient is AXOX4 and remains on room air. At shift change patient was noted to have chest pain. Per report, MD aware of continued chest pain. Patient started on Nitroglycerin  drip and given PRN Morphine  for pain control.   Patient remains on continuous fluids, see MAR.   Safety measures are in place, call light is within reach, and 4P's addressed.

## 2023-08-12 NOTE — Progress Notes (Signed)
     [address]       [place] [address]             [phone]       Consults received Awaiting films and echo Was on plavix  so if candidate, earliest OR would be on 8/13,    Marti Acebo O Bianey Tesoro

## 2023-08-12 NOTE — Telephone Encounter (Signed)
 PHARMACIST LIPID MONITORING  Lindsay Camacho is a 58 y.o. y.o. adult admitted with NSTEMI.  Pharmacy has been consulted to optimize lipid-lowering therapy with the indication of secondary prevention for clinical ASCVD.   Recent Labs: Lipid Panel:     Component Value Date/Time   CHOL 221 (H) 08/11/2023 0445   CHOL 132 03/07/2019 0923   TRIG 51 08/11/2023 0445   HDL 49 08/11/2023 0445   HDL 40 03/07/2019 0923   CHOLHDL 4.5 08/11/2023 0445   VLDL 10 08/11/2023 0445   LDLCALC 162 (H) 08/11/2023 0445   LDLCALC 79 03/07/2019 0923    Hepatic function panel:     Component Value Date/Time   AST 25 08/10/2023 2236   AST 22 07/05/2011 1145   ALT 13 08/10/2023 2236   ALT 15 07/05/2011 1145   ALKPHOS 71 08/10/2023 2236   ALKPHOS 86 07/05/2011 1145   BILITOT 1.1 08/10/2023 2236   BILITOT 0.8 09/07/2020 1414   BILITOT 0.5 07/05/2011 1145   BILIDIR 0.1 03/20/2023 1734   BILIDIR 0.16 09/07/2020 1414   IBILI 0.7 03/20/2023 1734    Serum Creatinine  Creat (mg/dL)  Date Value  89/74/7982 0.85   Creatinine, Ser (mg/dL)  Date Value  91/89/7974 1.21 (H)   Estimated CrCl of Estimated Creatinine Clearance: 38.2 mL/min (A) (by C-G formula based on SCr of 1.21 mg/dL (H)).  Current therapy and lipid therapy tolerance: Current lipid-lowering therapy: Atorvastatin  80mg /d, bempedoic acid  Previous lipid-lowering therapies (if applicable): Zetia , inclisiran, PCSK9?   Assessment:    Patient is  Statin-tolerant Baseline LDL: 162 Major ASCVD events: History of MI or ischemic stroke  Plan:    Statin intensity: No statin changes.  The patient is already on high intensity statin. Refer to lipid clinic:   Yes  Prentice Poisson, PharmD Clinical Pharmacist **Pharmacist phone directory can now be found on [email or web address] ([email or web address]).  Listed under [facility].

## 2023-08-13 ENCOUNTER — Inpatient Hospital Stay (HOSPITAL_COMMUNITY)

## 2023-08-13 DIAGNOSIS — I771 Stricture of artery: Secondary | ICD-10-CM

## 2023-08-13 DIAGNOSIS — I25119 Atherosclerotic heart disease of native coronary artery with unspecified angina pectoris: Secondary | ICD-10-CM

## 2023-08-13 DIAGNOSIS — I252 Old myocardial infarction: Secondary | ICD-10-CM

## 2023-08-13 DIAGNOSIS — I5022 Chronic systolic (congestive) heart failure: Secondary | ICD-10-CM

## 2023-08-13 DIAGNOSIS — I110 Hypertensive heart disease with heart failure: Secondary | ICD-10-CM

## 2023-08-13 DIAGNOSIS — I081 Rheumatic disorders of both mitral and tricuspid valves: Secondary | ICD-10-CM

## 2023-08-13 DIAGNOSIS — I25110 Atherosclerotic heart disease of native coronary artery with unstable angina pectoris: Secondary | ICD-10-CM | POA: Diagnosis not present

## 2023-08-13 DIAGNOSIS — Z95810 Presence of automatic (implantable) cardiac defibrillator: Secondary | ICD-10-CM

## 2023-08-13 DIAGNOSIS — Z9889 Other specified postprocedural states: Secondary | ICD-10-CM

## 2023-08-13 DIAGNOSIS — I214 Non-ST elevation (NSTEMI) myocardial infarction: Secondary | ICD-10-CM

## 2023-08-13 DIAGNOSIS — E785 Hyperlipidemia, unspecified: Secondary | ICD-10-CM

## 2023-08-13 DIAGNOSIS — I5032 Chronic diastolic (congestive) heart failure: Secondary | ICD-10-CM

## 2023-08-13 DIAGNOSIS — Z01810 Encounter for preprocedural cardiovascular examination: Secondary | ICD-10-CM | POA: Diagnosis not present

## 2023-08-13 DIAGNOSIS — Z720 Tobacco use: Secondary | ICD-10-CM

## 2023-08-13 DIAGNOSIS — Z95820 Peripheral vascular angioplasty status with implants and grafts: Secondary | ICD-10-CM

## 2023-08-13 LAB — CBC
HCT: 38.6 % (ref 36.0–46.0)
Hemoglobin: 12.7 g/dL (ref 12.0–15.0)
MCH: 29.1 pg (ref 26.0–34.0)
MCHC: 32.9 g/dL (ref 30.0–36.0)
MCV: 88.3 fL (ref 80.0–100.0)
Platelets: 144 K/uL — ABNORMAL LOW (ref 150–400)
RBC: 4.37 MIL/uL (ref 3.87–5.11)
RDW: 14.5 % (ref 11.5–15.5)
WBC: 7.9 K/uL (ref 4.0–10.5)
nRBC: 0 % (ref 0.0–0.2)

## 2023-08-13 LAB — BASIC METABOLIC PANEL WITH GFR
Anion gap: 7 (ref 5–15)
BUN: 23 mg/dL — ABNORMAL HIGH (ref 6–20)
CO2: 24 mmol/L (ref 22–32)
Calcium: 8.5 mg/dL — ABNORMAL LOW (ref 8.9–10.3)
Chloride: 108 mmol/L (ref 98–111)
Creatinine, Ser: 0.95 mg/dL (ref 0.44–1.00)
GFR, Estimated: >60 mL/min (ref >60)
Glucose, Bld: 85 mg/dL (ref 70–99)
Potassium: 3.9 mmol/L (ref 3.5–5.1)
Sodium: 139 mmol/L (ref 135–145)

## 2023-08-13 LAB — HEPARIN LEVEL (UNFRACTIONATED): Heparin Unfractionated: 0.39 [IU]/mL (ref 0.30–0.70)

## 2023-08-13 LAB — VAS US DOPPLER PRE CABG
Left ABI: 1.26
Right ABI: 1.22

## 2023-08-13 MED ORDER — HYDROCODONE-ACETAMINOPHEN 5-325 MG PO TABS
1.0000 | ORAL_TABLET | Freq: Four times a day (QID) | ORAL | Status: DC
  Administered 2023-08-13 – 2023-08-16 (×21): 1 via ORAL
  Filled 2023-08-13 (×11): qty 1

## 2023-08-13 MED ORDER — MORPHINE SULFATE (PF) 2 MG/ML IV SOLN: 2.0000 mg | Freq: Once | INTRAVENOUS | Status: DC

## 2023-08-13 MED ORDER — SPIRONOLACTONE 25 MG PO TABS
25.0000 mg | ORAL_TABLET | Freq: Every day | ORAL | Status: DC
Start: 2023-08-13 — End: 2023-08-16
  Administered 2023-08-13 – 2023-08-15 (×6): 25 mg via ORAL
  Filled 2023-08-13 (×3): qty 1

## 2023-08-13 MED ORDER — TRAMADOL HCL 50 MG PO TABS
50.0000 mg | ORAL_TABLET | Freq: Two times a day (BID) | ORAL | Status: DC | PRN
  Administered 2023-08-13 (×2): 50 mg via ORAL
  Filled 2023-08-13: qty 1

## 2023-08-13 NOTE — Plan of Care (Signed)

## 2023-08-13 NOTE — Progress Notes (Addendum)
 Progress Note  Patient Name: Lindsay Camacho Date of Encounter: 08/13/2023 [facility] Cardiologist: Oneil Parchment, MD   Interval Summary    Anxious regarding the possibility of surgery, still with ongoing chest pain. Atypical as worse with palpitation but reports the NTG drip helps  Vital Signs Vitals:   08/12/23 1954 08/12/23 2346 08/13/23 0427 08/13/23 0722  BP: 112/73 (!) 108/59 (!) 149/80 (!) 143/84  Pulse: (!) 53 (!) 108 (!) 56 (!) 53  Resp: 20 20 20 18   Temp: 97.6 F (36.4 C) 97.6 F (36.4 C) 97.7 F (36.5 C) 97.6 F (36.4 C)  TempSrc: Oral Oral Oral Oral  SpO2: 97% 95% 96% 98%  Weight:      Height:        Intake/Output Summary (Last 24 hours) at 08/13/2023 9077 Last data filed at 08/12/2023 1545 Gross per 24 hour  Intake 480 ml  Output --  Net 480 ml      08/10/2023    5:00 PM 07/10/2023    1:59 PM 05/22/2023    4:21 PM  Last 3 Weights  Weight (lbs) 110 lb 10.7 oz 109 lb 6.4 oz 107 lb  Weight (kg) 50.2 kg 49.624 kg 48.535 kg     Cardiac Studies Cardiac cath from [facility]: LM normal, LAD 80-90%, D1 70-80%, LCX 70-80% lesion at stent edge, OM1 60-70%, RCA prox 20-30% with diffuse haziness in the mid segment which could represent thrombus, distal RCA 99%.  (Films personally reviewed.  D1 and OM1 are relatively small caliber vessels, not likely targets for bypass, however otherwise the main LCx, PDA PL system and LAD are all favorable targets for CABG.  The RCA has enough extensive disease that would make it very difficult to consider PCI, which would mean CABG for revascularization is the best option.) Echocardiogram (08/12/2023): EF 55 to 60%.  No RWMA.  Indeterminate diastolic pressures with mildly elevated PAP.  Mild MR with mild MS (mean MV G 4 mmHg)-26 mm Edwards McCarthy prosthetic annuloplasty ring from 08/25/2016).  Moderate TR.  Mild to moderate AI with no AAS.  RAP estimated 8 mmHg.   Telemetry/ECG   Sinus Bradycardia, V paced - Personally  Reviewed  Physical Exam  GEN: No acute distress.   Neck: No JVD Cardiac: RRR, soft systolic murmur, no rubs, or gallops.  Respiratory: Clear to auscultation bilaterally. GI: Soft, nontender, non-distended  MS: No edema  Assessment & Plan   58 y.o. female with CAD with prior stenting, chronic HFrEF, ICD, MV repair, HLD, subclavian stenosis with prior stenting, HLD who was seen 08/10/2023 for the evaluation of chest pain.   NSTEMI CAD s/p prior PCI of Lcx -- history of lateral MI in 2015 treated with PCI to LCX. Procedure complicated by retroperitoneal bleeding requiring vascular surgery. Repeat left and right heart cath prior to valve surgery in August 2018 showed patent left circumflex stent with minimal restenosis and mild nonobstructive CAD in the RCA and LAD  -- presented to [facility] with NSTEMI and had ICD shock while in the ED for VF -- cath reported from [facility] showed 3 vessel disease involving the LAD, LCX, RCA. Normal LVEDP. LM normal, LAD 80-90%, D1 70-80%, LCX 70-80% lesion at stent edge, OM1 60-70%, RCA prox 20-30% with diffuse haziness in the mid segment which could represent thrombus, distal RCA 99%.  -- transferred to [facility] for evaluation for possible CABG -- cath films were not sent, but I have reached out to [employer] ([place]) to help pull  films into power share today -- echo 8/10 shows LVEF of 55-60%, no rWMA, mild LVH, normal RV, mild LA enlargement, mild MR/MS with mean gradient of 4.9mmHg, moderate TR -- continue IV heparin , NTG, ASA, crestor  40mg  daily, zetia  10mg  daily, losartan  100mg  daily, coreg  3.125mg  BID, increase spiro to 25mg  daily. Plavix  on hold since 8/8 Intermittent chest discomfort which seems musculoskeletal in nature.  Agree with lidocaine  patch, would continue to treat with analgesics.)  S/p MV repair Tricuspid regurg '18 -- mild MR/MS with mean gradient of 4.81mmHg 26 mm Edwards Mcarthy Adams prosthetic annuloplasty ring present in the  mitral position,  moderate TR  ICD-medtronic  -- s/p ICD shock at [facility] 8/7 at 1327 for VF in the setting of NSTEMI -- K+ 3.9, check mag   HFimpEF -- initially underwent Medtronic CRT-D implant January 2019 for EF of 20 to 25%  --12/2022 echo: LVEF 55%, indeterminate diasotlic function, normal RV function, normal MV repair without valve dysfunction, severe TR, mild aortic stenosis -- echo 8/10 LVEF of 55-60%, no rWMA, mild LVH, normal RV, mild LA enlargement, mild MR/MS with mean gradient of 4.50mmHg, moderate TR -- GDMT: continue coreg  3.125mg  BID, losartan  100mg  daily, increase spiro to 25mg  daily   HLD -- on atorvastatin  80mg  daily, previously seen in lipid clinic. Reports side effects of PCSK9i and Leqvio  -- on nexletol  PTA, continue Zetia  10mg  daily  -- LDL 162, HDL 49, LP(a) 209 -- now on Crestor  40mg  daily  Hx of left subclavian stenosis/stent => status post stenting of the left subclavian-May require at a minimum upper extremity arterial Dopplers versus CT A to assess patency.     Signed, Manuelita Rummer, NP    ATTENDING ATTESTATION  I have seen, examined and evaluated the patient along with Manuelita Rummer, NP-C.  After reviewing all the available data and chart, we discussed the patients laboratory, study & physical findings as well as symptoms in detail.  I agree with her findings, examination as well as impression recommendations as per our discussion.    Attending adjustments noted in italics.   Still having intermittent chest pain which is more reproducible on exam. => Agree with analgesic therapy-can increase pain medications pending CVTS consultation.  Cath films personally reviewed.  D1 and OM1 are relatively small caliber vessels, not likely targets for bypass, however otherwise the main LCx, PDA PL system and LAD are all favorable targets for CABG.  The RCA has enough extensive disease that would make it very difficult to consider PCI, which would mean CABG for revascularization  is the best option. => Waiting for CVTS consultation.   For questions or updates, please contact [facility] Please consult [email or web address] for contact info under        Alm MICAEL Clay, MD, MS Alm Clay, M.D., M.S. Interventional Chartered certified accountant  Pager # [phone]

## 2023-08-13 NOTE — TOC Initial Note (Signed)
 Transition of Care [facility]) - Initial/Assessment Note    Patient Details  Name: Lindsay Camacho MRN: [id number] Date of Birth: [birth year]  Transition of Care [facility]) CM/SW Contact:    Sudie Erminio Deems, RN Phone Number: 08/13/2023, 11:35 AM  Clinical Narrative:  Patient presented for chest pain-nstemi. Cardiology is following for plan of care. Patient continues on IV Heparin  gtt. PTA patient was from home with daughter. Patient has PCP- and gets to appointments without any issues. Patient has DME listed below. Case Manager will continue to follow for additional needs as the patient progresses.                   Expected Discharge Plan: Home w Home Health Services Barriers to Discharge: Continued Medical Work up   Patient Goals and CMS Choice Patient states their goals for this hospitalization and ongoing recovery are:: Plans to return home once stable.  Expected Discharge Plan and Services In-house Referral: NA Discharge Planning Services: CM Consult Post Acute Care Choice: NA Living arrangements for the past 2 months: Single Family Home                   DME Agency: NA  Prior Living Arrangements/Services Living arrangements for the past 2 months: Single Family Home Lives with:: Adult Children Patient language and need for interpreter reviewed:: Yes Do you feel safe going back to the place where you live?: Yes      Need for Family Participation in Patient Care: No (Comment) Care giver support system in place?: No (comment) Current home services: DME (walker and shower seat) Criminal Activity/Legal Involvement Pertinent to Current Situation/Hospitalization: No - Comment as needed  Activities of Daily Living   ADL Screening (condition at time of admission) Independently performs ADLs?: Yes (appropriate for developmental age) Is the patient deaf or have difficulty hearing?: No Does the patient have difficulty seeing, even when wearing glasses/contacts?: No Does the patient  have difficulty concentrating, remembering, or making decisions?: No  Permission Sought/Granted Permission sought to share information with : Family Supports, Case Manager    Emotional Assessment Appearance:: Appears stated age Attitude/Demeanor/Rapport: Engaged Affect (typically observed): Appropriate Orientation: : Oriented to Self, Oriented to Place, Oriented to  Time, Oriented to Situation Alcohol  / Substance Use: Not Applicable Psych Involvement: No (comment)  Admission diagnosis:  NSTEMI (non-ST elevated myocardial infarction) [facility]) [I21.4] Patient Active Problem List   Diagnosis Date Noted   Unstable angina (HCC) 12/29/2022   Acute stroke due to ischemia (HCC) 03/15/2022   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   Slurred speech 03/13/2022   Left subclavian artery occlusion 09/21/2021   Subclavian arterial stenosis (HCC) 08/28/2021   Chest pain, rule out acute myocardial infarction 07/22/2018   ICD (implantable cardioverter-defibrillator) in place 01/26/2017   Nonischemic cardiomyopathy (HCC) 01/26/2017   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/25/2016   Stenosis of left subclavian artery (HCC) 08/22/2016   Aortoiliac occlusive disease (HCC) 08/22/2016   Acute on chronic systolic CHF (congestive heart failure) (HCC)    Mitral regurgitation 08/17/2016   Heart failure (HCC) 08/17/2016   Shortness of breath    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   Diarrhea 10/05/2014   Syncope 03/09/2014   Chronic systolic CHF (congestive heart failure) (HCC) 01/12/2014   Chest pain, atypical, muscular skelatal   01/09/2014   Diastolic CHF, acute (HCC) 01/08/2014   Chest pain at rest 01/08/2014   Congestive heart disease (HCC)    Post-op pain-Right groin area 05/27/2013  Drainage from wound-Groin area 05/27/2013   Retroperitoneal hematoma 05/01/2013   Retroperitoneal bleed 04/15/2013   Anemia due to blood loss 04/15/2013   Hyperlipidemia with target LDL  less than 70 04/15/2013   Intermediate coronary syndrome [facility])    Chest pain 04/11/2013   CAD - CFX DES 01/17/13 01/20/2013   Rash post PCI 01/20/2013   NSTEMI (non-ST elevated myocardial infarction) (HCC) 01/17/2013   Chest pain, musculoskeletal 10/07/2012   HTN (hypertension) 10/07/2012   LBBB (left bundle branch block) 10/07/2012   Tobacco abuse 10/07/2012   PCP:  Norrine Sharper, MD Pharmacy:   [facility] [address], [place] - [address] [place] [address] Phone: [phone] Fax: [phone]  [address] [place], [place] - [place], [place] - [address] [place] [address] Phone: [phone] Fax: [phone]  [facility] [address] [place] [address] Phone: [phone] Fax: [phone]     Social Drivers of Health (SDOH) Social History: SDOH Screenings   Food Insecurity: No Food Insecurity (08/10/2023)  Housing: Unknown (08/12/2023)  Transportation Needs: No Transportation Needs (08/10/2023)  Utilities: Not At Risk (08/10/2023)  Social Connections: Unknown (08/10/2023)  Tobacco Use: High Risk (07/12/2023)   SDOH Interventions:     Readmission Risk Interventions     No data to display

## 2023-08-13 NOTE — Consult Note (Addendum)
 [address]       [place] [address]             [id number]        Lindsay Camacho [facility] Medical Record #[id number] Date of Birth: October 30, 1965  Referring: Erin Cea, DO Primary Care: Norrine Sharper, MD Primary Cardiologist:Mark Jeffrie, MD  Chief Complaint: CAD  History of Present Illness:      Lindsay Camacho is a 58 yo female with known history of CAD with prior stenting, chronic HFrEF, ICD placed in 2019, MV repair, HLD, subclavian stenosis with prior stenting,  and HLD.  Her Coronary disease dates back to 2015 at which time she suffered an MI and underwent stenting of her Left Circumflex.  She suffered a retroperitoneal bleed post procedure.  Repeat catheterization in 2018 showed patent previously placed stent.  She did however undergo Minimally Invasive Mitral Valve Repair by Dr. Dusty in 2018 and overall this was uncomplicated.  She presented to OSH ED in [place], [place] with complaints of chest pain.  While being evaluated in the ED she did experience an ICD shock, interrogation revealed patient was noted to be in V. Fib at time of shock.  Workup showed elevated troponins and in setting of ICD shock they were concerned that ischemia was the source of the arrhythmia.  They performed cardiac catheterization which showed 3V CAD.  They felt transfer to [facility] was indicated for cardiothoracic surgical consultation.  Upon arrival she was started on Heparin  gtt.  Echocardiogram was obtained and showed previously repair Mitral Valve with mild MR.  There was evidence of Tricuspid Regurgitation, and EF of 55%.  She developed recurrent complaints of chest pain 8/9 described as being pressure like in quality.  This responded to Morphine .  She was also tender along LSB which would likely be expected in setting of recent ICD shock.  It was felt the patient should be placed on NTG drip and titrate as needed.  This has helped improve patient's chest pain however, she  continues to experience.  She experiences shortness of breath with activity.  She is disabled.  She is a current smoker of 1/2 ppd x 40 years.. stated she was younger than 44.  She has a family history of CAD with both parents being deceased.  She lives with her daughter who will be able to provide assistance at discharge.  Current Activity/ Functional Status: Patient is independent with mobility/ambulation, transfers, ADL's, IADL's.  DISABLED   Zubrod Score: At the time of surgery this patient's most appropriate activity status/level should be described as: []     0    Normal activity, no symptoms [x]     1    Restricted in physical strenuous activity but ambulatory, able to do out light work []     2    Ambulatory and capable of self care, unable to do work activities, up and about                 more than 50%  Of the time                            []     3    Only limited self care, in bed greater than 50% of waking hours []     4    Completely disabled, no self care, confined to bed or chair []     5  Moribund  Past Medical History:  Diagnosis Date   Acute myocardial infarction of other lateral wall, initial episode of care 01/2013   DES CFX   Aortoiliac occlusive disease (HCC) 08/22/2016   CAD (coronary artery disease) 11/13/2011   50% ? proximal LCx stenosis per Cath at [facility]   Chest pain, atypical, muscular skelatal   01/09/2014   COVID 02/08/2020   Dyspnea    Heart failure (HCC)    HLD (hyperlipidemia)    Hypertension    Intermediate coronary syndrome (HCC)    LBBB (left bundle branch block)    Mitral regurgitation    Nonischemic cardiomyopathy (HCC) 01/26/2017   Presence of combination internal cardiac defibrillator (ICD) and pacemaker    PVD (peripheral vascular disease) (HCC)    Right CFA stenosis per report on 11/14/2011   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/25/2016   Edwards McArthy-Adams IMR ETLogix ring annuloplasty (Model 4100, Serial [id number], size 26) placed  via right mini thoracotomy approach   Stenosis of left subclavian artery (HCC) 08/22/2016   Stroke (HCC)    tia with no deficits    Past Surgical History:  Procedure Laterality Date   AORTIC ARCH ANGIOGRAPHY N/A 09/21/2021   Procedure: AORTIC ARCH ANGIOGRAPHY;  Surgeon: Darron Deatrice LABOR, MD;  Location: [facility] INVASIVE CV LAB;  Service: Cardiovascular;  Laterality: N/A;   APPENDECTOMY     BIV ICD INSERTION CRT-D N/A 01/24/2017   Procedure: BIV ICD INSERTION CRT-D;  Surgeon: Waddell Danelle ORN, MD;  Location: [facility] INVASIVE CV LAB;  Service: Cardiovascular;  Laterality: N/A;   CARDIAC CATHETERIZATION  04/14/2013   Non-obstructive disease, patent CFX stent   CARDIAC CATHETERIZATION N/A 09/21/2014   Procedure: Left Heart Cath and Coronary Angiography;  Surgeon: Debby LABOR Sor, MD;  Location: [facility] INVASIVE CV LAB;  Service: Cardiovascular;  Laterality: N/A;   CORONARY ANGIOPLASTY WITH STENT PLACEMENT  01/17/2013   mild disease except 99% CFX, rx with  2.5 x 28 Alpine drug-eluting stent    GROIN DEBRIDEMENT Right 04/14/2013   Procedure: Emergency Evacuation of Retroperitoneal Hematoma and Repair Right External Iliac Artery Pseudoaneurysm    ;  Surgeon: Carlin FORBES Haddock, MD;  Location: [facility] OR;  Service: Vascular;  Laterality: Right;   LEFT HEART CATHETERIZATION WITH CORONARY ANGIOGRAM N/A 01/18/2013   Procedure: LEFT HEART CATHETERIZATION WITH CORONARY ANGIOGRAM;  Surgeon: Candyce GORMAN Reek, MD;  Location: [facility] CATH LAB;  Service: Cardiovascular;  Laterality: N/A;   LEFT HEART CATHETERIZATION WITH CORONARY ANGIOGRAM N/A 04/14/2013   Procedure: LEFT HEART CATHETERIZATION WITH CORONARY ANGIOGRAM;  Surgeon: Candyce GORMAN Reek, MD;  Location: [facility] CATH LAB;  Service: Cardiovascular;  Laterality: N/A;   MITRAL VALVE REPAIR Right 08/25/2016   Procedure: MINIMALLY INVASIVE MITRAL VALVE REPAIR;  Surgeon: Dusty Sudie DEL, MD;  Location: [facility] OR;  Service: Open Heart Surgery;  Laterality: Right;   MULTIPLE EXTRACTIONS WITH  ALVEOLOPLASTY N/A 08/23/2016   Procedure: Extraction of tooth #'s 17, 20-23, 26-29 with alveoloplasty;  Surgeon: Cyndee Tanda FALCON, DDS;  Location: [facility] OR;  Service: Oral Surgery;  Laterality: N/A;   PERCUTANEOUS CORONARY STENT INTERVENTION (PCI-S)  01/18/2013   Procedure: PERCUTANEOUS CORONARY STENT INTERVENTION (PCI-S);  Surgeon: Candyce GORMAN Reek, MD;  Location: [facility] CATH LAB;  Service: Cardiovascular;;   RIGHT/LEFT HEART CATH AND CORONARY ANGIOGRAPHY N/A 08/21/2016   Procedure: RIGHT/LEFT HEART CATH AND CORONARY ANGIOGRAPHY;  Surgeon: Verlin Lonni BIRCH, MD;  Location: [facility] INVASIVE CV LAB;  Service: Cardiovascular;  Laterality: N/A;   TEE WITHOUT CARDIOVERSION N/A 03/06/2014  Procedure: TRANSESOPHAGEAL ECHOCARDIOGRAM (TEE);  Surgeon: Leim VEAR Moose, MD;  Location: [facility] ENDOSCOPY;  Service: Cardiovascular;  Laterality: N/A;   TEE WITHOUT CARDIOVERSION N/A 08/18/2016   Procedure: TRANSESOPHAGEAL ECHOCARDIOGRAM (TEE);  Surgeon: Pietro Redell RAMAN, MD;  Location: [facility] ENDOSCOPY;  Service: Cardiovascular;  Laterality: N/A;   TEE WITHOUT CARDIOVERSION N/A 08/25/2016   Procedure: TRANSESOPHAGEAL ECHOCARDIOGRAM (TEE);  Surgeon: Dusty Sudie VEAR, MD;  Location: [facility] OR;  Service: Open Heart Surgery;  Laterality: N/A;   TUBAL LIGATION     UPPER EXTREMITY INTERVENTION  09/21/2021   Procedure: UPPER EXTREMITY INTERVENTION;  Surgeon: Darron Deatrice LABOR, MD;  Location: [facility] INVASIVE CV LAB;  Service: Cardiovascular;;    Social History   Tobacco Use  Smoking Status Every Day   Current packs/day: 0.50   Types: Cigarettes  Smokeless Tobacco Never    Social History   Substance and Sexual Activity  Alcohol  Use No   Alcohol /week: 0.0 standard drinks of alcohol      Allergies  Allergen Reactions   Aldomet [Methyldopa] Other (See Comments)    Severe hypotension   Brilinta  [Ticagrelor ] Shortness Of Breath   Other Other (See Comments)    Nuclear Stress Test Medication caused seizures   Coumadin  [Warfarin  Sodium] Swelling    Arm swelling   Desyrel [Trazodone] Other (See Comments)    Could not wake up from medication, comatosed   Eliquis [Apixaban] Other (See Comments)    BP got too low   Entresto  [Sacubitril -Valsartan ] Hypertension    Hypotension    Zestril  [Lisinopril ]     Significant blood pressure fluctuations    Penicillins Rash    Current Facility-Administered Medications  Medication Dose Route Frequency Provider Last Rate Last Admin   acetaminophen  (TYLENOL ) tablet 1,000 mg  1,000 mg Oral Q8H Floretta Mallard, MD   1,000 mg at 08/13/23 1303   albuterol  (PROVENTIL ) (2.5 MG/3ML) 0.083% nebulizer solution 2.5 mg  2.5 mg Nebulization Q4H PRN Reome, Earle J, RPH       aspirin  EC tablet 81 mg  81 mg Oral Daily Goodrich, Callie E, PA-C   81 mg at 08/13/23 0955   carvedilol  (COREG ) tablet 3.125 mg  3.125 mg Oral BID WC Goodrich, Callie E, PA-C   3.125 mg at 08/13/23 9043   ezetimibe  (ZETIA ) tablet 10 mg  10 mg Oral Daily Alvan Dorn FALCON, MD   10 mg at 08/13/23 0956   heparin  ADULT infusion 100 units/mL (25000 units/250mL)  650 Units/hr Intravenous Continuous Alvan Dorn FALCON, MD 6.5 mL/hr at 08/12/23 0819 650 Units/hr at 08/12/23 0819   lidocaine  (LIDODERM ) 5 % 1 patch  1 patch Transdermal Q24H Floretta Mallard, MD   1 patch at 08/12/23 2001   losartan  (COZAAR ) tablet 100 mg  100 mg Oral Daily Goodrich, Callie E, PA-C   100 mg at 08/13/23 9043   morphine  (PF) 2 MG/ML injection 2 mg  2 mg Intravenous Q4H PRN Alvan Dorn FALCON, MD   2 mg at 08/13/23 1302   morphine  (PF) 2 MG/ML injection 2 mg  2 mg Intravenous Once Almetta Donnice LABOR, MD       nitroGLYCERIN  50 mg in dextrose  5 % 250 mL (0.2 mg/mL) infusion  2-200 mcg/min Intravenous Titrated Floretta Mallard, MD 18 mL/hr at 08/13/23 1307 60 mcg/min at 08/13/23 1307   ondansetron  (ZOFRAN ) injection 4 mg  4 mg Intravenous Q6H PRN Goodrich, Callie E, PA-C   4 mg at 08/13/23 1303   pantoprazole  (PROTONIX ) EC tablet 40 mg  40 mg Oral Daily  Goodrich, Callie E, PA-C   40 mg at 08/13/23 9044   rosuvastatin  (CRESTOR ) tablet 40 mg  40 mg Oral Daily Branch, Jonathan F, MD   40 mg at 08/13/23 9044   spironolactone  (ALDACTONE ) tablet 25 mg  25 mg Oral Daily Henry Shaver B, NP   25 mg at 08/13/23 0955   traMADol  (ULTRAM ) tablet 50 mg  50 mg Oral Q12H PRN Henry Shaver NOVAK, NP        Medications Prior to Admission  Medication Sig Dispense Refill Last Dose/Taking   amLODipine  (NORVASC ) 10 MG tablet Take 1 tablet (10 mg total) by mouth daily. 30 tablet 3 Past Week   aspirin  EC 81 MG tablet Take 81 mg by mouth daily.   Past Week   atorvastatin  (LIPITOR ) 80 MG tablet Take 1 tablet (80 mg total) by mouth daily. 90 tablet 3 Past Week   Bempedoic Acid  (NEXLETOL ) 180 MG TABS Take 1 tablet (180 mg total) by mouth daily. 90 tablet 3 Past Week   clopidogrel  (PLAVIX ) 75 MG tablet TAKE 1 TABLET BY MOUTH DAILY 90 tablet 3 Past Week   cyclobenzaprine  (FLEXERIL ) 10 MG tablet Take 1 tablet (10 mg total) by mouth 3 (three) times daily as needed for muscle spasms. 10 tablet 0 Past Month   furosemide  (LASIX ) 40 MG tablet Take 1 tablet (40 mg total) by mouth daily as needed for fluid or edema (shortness of breath). 90 tablet 3 Unknown   losartan  (COZAAR ) 100 MG tablet Take 1 tablet (100 mg total) by mouth daily. 90 tablet 3 Past Week   meloxicam (MOBIC) 15 MG tablet Take 15 mg by mouth daily as needed for pain.   Unknown   Menthol-Methyl Salicylate (SALONPAS PAIN RELIEF PATCH EX) Apply 1 patch topically daily as needed (pain).   Past Week   metoprolol  succinate (TOPROL -XL) 50 MG 24 hr tablet Take 2 tablets (100 mg total) by mouth daily. 180 tablet 3 08/09/2023   spironolactone  (ALDACTONE ) 25 MG tablet Take 0.5 tablets (12.5 mg total) by mouth daily. 45 tablet 3 Past Week   VENTOLIN  HFA 108 (90 Base) MCG/ACT inhaler Inhale 1-2 puffs into the lungs every 4 (four) hours as needed for shortness of breath. 1 each 0 Past Week    Family History  Problem Relation  Age of Onset   CAD Mother 55   Lung cancer Mother    Bladder Cancer Mother    Stroke Mother    Heart disease Mother        Before age 83   Hypertension Mother    Heart attack Mother    CAD Father 32   Heart disease Father        Before age 71   Heart attack Father    Stroke Father        Bleeding stroke     Review of Systems:   ROS    Cardiac Review of Systems: Y or  [    ]= no  Chest Pain [ Y   ]  Resting SOB [   ] Exertional SOB  Davis.Dad  ]  Orthopnea [  ]   Pedal Edema [ N  ]    Palpitations [Y  ] Syncope  [  ]   Presyncope [ N  ]  General Review of Systems: [Y] = yes [  ]=no Constitional: recent weight change [  ]; anorexia [  ]; fatigue [Y  ]; nausea [ y recently, not on presentation ]; night sweats [  ];  fever [  ]; or chills [  ]                                                               Dental: Last Dentist visit: dentures  Eye : blurred vision [  ]; diplopia [   ]; vision changes [  ];  Amaurosis fugax[  ]; Resp: cough [ N ];  wheezing[  ];  hemoptysis[  ]; shortness of breath[  ]; paroxysmal nocturnal dyspnea[  ]; dyspnea on exertion[Y  ]; or orthopnea[  ];  GI:  gallstones[  ], vomiting[ N ];  dysphagia[  ]; melena[  ];  hematochezia [  ]; heartburn[  ];   Hx of  Colonoscopy[  ]; GU: kidney stones [  ]; hematuria[  ];   dysuria [  ];  nocturia[  ];  history of     obstruction [  ]; urinary frequency [  ]             Skin: rash, swelling[  ];, hair loss[  ];  peripheral edema[N  ];  or itching[  ]; Musculosketetal: myalgias[  ];  joint swelling[  ];  joint erythema[  ];  joint pain[  N];  back pain[  ];  Heme/Lymph: bruising[  ];  bleeding[  ];  anemia[  ];  Neuro: TIA[  ];  headaches[  ];  stroke[ Y ];  vertigo[  ];  seizures[  ];   paresthesias[  ];  difficulty walking[N  ];  Psych:depression[ N ]; anxiety[N  ];  Endocrine: diabetes[ N ];  thyroid dysfunction[ N ];  Physical Exam: BP (!) 156/86 (BP Location: Left Arm)   Pulse (!) 55   Temp 97.6 F (36.4 C) (Oral)    Resp 20   Ht 5' 1 (1.549 m)   Wt 50.2 kg   SpO2 99%   BMI 20.91 kg/m   General appearance: alert, cooperative, and no distress Head: Normocephalic, without obvious abnormality, atraumatic Neck: no adenopathy, no carotid bruit, no JVD, supple, symmetrical, trachea midline, and thyroid not enlarged, symmetric, no tenderness/mass/nodules Resp: clear to auscultation bilaterally Cardio: regular rate and rhythm and paced GI: soft, non-tender; bowel sounds normal; no masses,  no organomegaly Extremities: extremities normal, atraumatic, no cyanosis or edema Neurologic: Grossly normal  Diagnostic Studies & Laboratory data:     Recent Radiology Findings:   ECHOCARDIOGRAM COMPLETE Result Date: 08/12/2023    ECHOCARDIOGRAM REPORT   Patient Name:   Lindsay Camacho Date of Exam: 08/12/2023 Medical Rec #:  [id number]     Height:       61.0 in Accession #:    [id number]    Weight:       110.7 lb Date of Birth:  Nov 05, 1965     BSA:          1.469 m Patient Age:    58 years      BP:           115/66 mmHg Patient Gender: F             HR:           53 bpm. Exam Location:  Inpatient Procedure: 2D Echo (Both Spectral and Color Flow Doppler were utilized during  procedure). Indications:    cad of native vessel  History:        Patient has prior history of Echocardiogram examinations, most                 recent 12/30/2022. CAD, Defibrillator, Arrythmias:LBBB,                 Signs/Symptoms:Chest Pain; Risk Factors:Current Smoker.                  Mitral Valve: 26 mm Edwards Mcarthy Adams prosthetic                 annuloplasty ring valve is present in the mitral position.                 Procedure Date: 08/25/16.  Sonographer:    Tinnie Barefoot RDCS Referring Phys: [id number] ROLLO JONELLE LOUDER IMPRESSIONS  1. Left ventricular ejection fraction, by estimation, is 55 to 60%. The left ventricle has normal function. The left ventricle has no regional wall motion abnormalities. There is mild left ventricular  hypertrophy. Left ventricular diastolic parameters are indeterminate.  2. Right ventricular systolic function is normal. The right ventricular size is normal. There is mildly elevated pulmonary artery systolic pressure.  3. Left atrial size was mildly dilated.  4. The mitral valve is abnormal. Mild mitral valve regurgitation. Mild mitral stenosis. The mean mitral valve gradient is 4.0 mmHg. Moderate mitral annular calcification. There is a 26 mm Edwards Mcarthy Adams prosthetic annuloplasty ring present in the  mitral position. Procedure Date: 08/25/16.  5. The tricuspid valve is abnormal. Tricuspid valve regurgitation is moderate.  6. The aortic valve is tricuspid. Aortic valve regurgitation is mild to moderate. No aortic stenosis is present.  7. The inferior vena cava is dilated in size with >50% respiratory variability, suggesting right atrial pressure of 8 mmHg. FINDINGS  Left Ventricle: Left ventricular ejection fraction, by estimation, is 55 to 60%. The left ventricle has normal function. The left ventricle has no regional wall motion abnormalities. The left ventricular internal cavity size was normal in size. There is  mild left ventricular hypertrophy. Left ventricular diastolic parameters are indeterminate. Right Ventricle: The right ventricular size is normal. Right vetricular wall thickness was not well visualized. Right ventricular systolic function is normal. There is mildly elevated pulmonary artery systolic pressure. The tricuspid regurgitant velocity  is 2.97 m/s, and with an assumed right atrial pressure of 8 mmHg, the estimated right ventricular systolic pressure is 43.3 mmHg. Left Atrium: Left atrial size was mildly dilated. Right Atrium: Right atrial size was normal in size. Pericardium: There is no evidence of pericardial effusion. Mitral Valve: The mitral valve is abnormal. There is mild thickening of the mitral valve leaflet(s). There is mild calcification of the mitral valve leaflet(s).  Moderate mitral annular calcification. Mild mitral valve regurgitation. There is a 26 mm Edwards Mcarthy Adams prosthetic annuloplasty ring present in the mitral position. Procedure Date: 08/25/16. Mild mitral valve stenosis. MV peak gradient, 10.5 mmHg. The mean mitral valve gradient is 4.0 mmHg. Tricuspid Valve: The tricuspid valve is abnormal. Tricuspid valve regurgitation is moderate . No evidence of tricuspid stenosis. Aortic Valve: The aortic valve is tricuspid. There is mild aortic valve annular calcification. Aortic valve regurgitation is mild to moderate. Aortic regurgitation PHT measures 666 msec. No aortic stenosis is present. Aortic valve mean gradient measures 6.2 mmHg. Aortic valve peak gradient measures 14.9 mmHg. Aortic valve area, by VTI measures 1.70 cm. Pulmonic Valve: The pulmonic  valve was not well visualized. Pulmonic valve regurgitation is not visualized. No evidence of pulmonic stenosis. Aorta: The aortic root and ascending aorta are structurally normal, with no evidence of dilitation. Venous: The inferior vena cava is dilated in size with greater than 50% respiratory variability, suggesting right atrial pressure of 8 mmHg. IAS/Shunts: No atrial level shunt detected by color flow Doppler. Additional Comments: A device lead is visualized in the right atrium and right ventricle.  LEFT VENTRICLE PLAX 2D LVIDd:         3.80 cm LVIDs:         2.90 cm LV PW:         1.10 cm LV IVS:        1.10 cm LVOT diam:     1.90 cm LV SV:         65 LV SV Index:   45 LVOT Area:     2.84 cm  LV Volumes (MOD) LV vol d, MOD A4C: 66.2 ml LV vol s, MOD A4C: 37.9 ml LV SV MOD A4C:     66.2 ml RIGHT VENTRICLE             IVC RV Basal diam:  3.00 cm     IVC diam: 2.20 cm RV S prime:     11.90 cm/s TAPSE (M-mode): 1.6 cm LEFT ATRIUM             Index        RIGHT ATRIUM           Index LA diam:        4.00 cm 2.72 cm/m   RA Area:     14.10 cm LA Vol (A2C):   46.7 ml 31.80 ml/m  RA Volume:   32.40 ml  22.06 ml/m LA  Vol (A4C):   50.4 ml 34.31 ml/m LA Biplane Vol: 50.8 ml 34.59 ml/m  AORTIC VALVE AV Area (Vmax):    1.57 cm AV Area (Vmean):   1.69 cm AV Area (VTI):     1.70 cm AV Vmax:           192.75 cm/s AV Vmean:          112.088 cm/s AV VTI:            0.384 m AV Peak Grad:      14.9 mmHg AV Mean Grad:      6.2 mmHg LVOT Vmax:         107.00 cm/s LVOT Vmean:        67.000 cm/s LVOT VTI:          0.231 m LVOT/AV VTI ratio: 0.60 AI PHT:            666 msec  AORTA Ao Root diam: 2.40 cm Ao Asc diam:  2.80 cm MITRAL VALVE            TRICUSPID VALVE MV Peak grad: 10.5 mmHg TR Peak grad:   35.3 mmHg MV Mean grad: 4.0 mmHg  TR Vmax:        297.00 cm/s MV Vmax:      1.62 m/s MV Vmean:     94.8 cm/s SHUNTS                         Systemic VTI:  0.23 m                         Systemic Diam: 1.90 cm  Dorn Ross MD Electronically signed by Dorn Ross MD Signature Date/Time: 08/12/2023/12:47:32 PM    Final     I have independently reviewed the above radiologic studies and discussed with the patient   Recent Lab Findings: Lab Results  Component Value Date   WBC 7.9 08/13/2023   HGB 12.7 08/13/2023   HCT 38.6 08/13/2023   PLT 144 (L) 08/13/2023   GLUCOSE 85 08/13/2023   CHOL 221 (H) 08/11/2023   TRIG 51 08/11/2023   HDL 49 08/11/2023   LDLCALC 162 (H) 08/11/2023   ALT 13 08/10/2023   AST 25 08/10/2023   NA 139 08/13/2023   K 3.9 08/13/2023   CL 108 08/13/2023   CREATININE 0.95 08/13/2023   BUN 23 (H) 08/13/2023   CO2 24 08/13/2023   TSH 1.700 01/21/2017   INR 1.03 01/21/2017   HGBA1C 5.2 03/14/2022    Assessment / Plan:      CAD- presented with angina, ruled in for NSTEMI in [place]... transferred to [facility] for cardiothoracic consultation Mitral Regurgitation- S/P MI Repair by Dr. Dusty in 2018 H/O PPM/ICD placed in 2019.SABRA Chronic HFrEF.. improved up to 55% HLD H/O Subclavian Stenosis.. S/P Stenting  Patient is currently having intermittent chest pain relieved with NTG/Morphine  and  Lidocaine  Patch.  She is on Plavix  prior to admission and her last dose was on 8/8.  Her previous valve repair appears to be doing well with only Mild MR.  Consultation is requested for coronary bypass grafting.  She appears to be a reasonable candidate.  Of note she is left handed if radial artery would be possible conduit.  She is scared about having surgery, but is agreeable to proceed.  Dr. Shyrl and Dr. Kerrin are aware of the patient.  One of the surgeons will evaluate the patient and follow up with recommendations.  First OR availability is on Thursday.   I  spent 40 minutes counseling the patient face to face.   Lindsay Shad, PA-C 08/13/2023 1:11 PM   I have reviewed Lindsay Camacho' records and cath images, and have seen and examined her personally.  58 yo woman with history of hypertension, hyperlipidemia, tobacco abuse, mitral repair, ICD, and extensive PAD presented with CP complicated by V fib arrest (converted with ICD).  Cath shows severe 3 vessel CAD.  Currently angina free on heparin  and NTG drips, but c/o headache and chest wall pain.  CABG indicated for survival benefit and relief of symptoms.   I discussed the general nature of the procedure, including the need for general anesthesia, the incisions to be used, the use of cardiopulmonary bypass, and the use of temporary pacemaker wires and drainage tubes postoperatively with Lindsay Camacho.  We discussed the expected hospital stay, overall recovery and short and long term outcomes. I informed her of the indications, risks, benefits and alternatives.   She understands the risks include, but are not limited to death, stroke, MI, DVT/PE, bleeding, possible need for transfusion, infections, cardiac arrhythmias, displacement of pacer/ ICD leads, as well as other organ system dysfunction including respiratory, renal, or GI complications.    Tentatively plan CABG Thursday 8/14 to allow Plavix  washout  Elspeth C. Kerrin, MD [facility] [phone]
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
## 2023-08-13 NOTE — Progress Notes (Signed)
 PreCABG exams are completed. Dema Timmons, RVT

## 2023-08-13 NOTE — Plan of Care (Signed)
  Problem: Education: Goal: Knowledge of General Education information will improve Description: Including pain rating scale, medication(s)/side effects and non-pharmacologic comfort measures Outcome: Progressing   Problem: Nutrition: Goal: Adequate nutrition will be maintained Outcome: Progressing   Problem: Coping: Goal: Level of anxiety will decrease Outcome: Progressing   Problem: Safety: Goal: Ability to remain free from injury will improve Outcome: Progressing   Problem: Education: Goal: Understanding of cardiac disease, CV risk reduction, and recovery process will improve Outcome: Progressing   Problem: Clinical Measurements: Goal: Cardiovascular complication will be avoided Outcome: Not Met (add Reason) Note: On-going   Problem: Pain Managment: Goal: General experience of comfort will improve and/or be controlled Outcome: Not Met (add Reason) Note: On-going   Problem: Activity: Goal: Ability to tolerate increased activity will improve Outcome: Not Met (add Reason) Note: On-going

## 2023-08-13 NOTE — H&P (View-Only) (Signed)
 [address]       [place] [address]             [id number]        Lindsay Camacho [facility] Medical Record #[id number] Date of Birth: October 30, 1965  Referring: Erin Cea, DO Primary Care: Norrine Sharper, MD Primary Cardiologist:Mark Jeffrie, MD  Chief Complaint: CAD  History of Present Illness:      Lindsay Camacho is a 58 yo female with known history of CAD with prior stenting, chronic HFrEF, ICD placed in 2019, MV repair, HLD, subclavian stenosis with prior stenting,  and HLD.  Her Coronary disease dates back to 2015 at which time she suffered an MI and underwent stenting of her Left Circumflex.  She suffered a retroperitoneal bleed post procedure.  Repeat catheterization in 2018 showed patent previously placed stent.  She did however undergo Minimally Invasive Mitral Valve Repair by Dr. Dusty in 2018 and overall this was uncomplicated.  She presented to OSH ED in [place], [place] with complaints of chest pain.  While being evaluated in the ED she did experience an ICD shock, interrogation revealed patient was noted to be in V. Fib at time of shock.  Workup showed elevated troponins and in setting of ICD shock they were concerned that ischemia was the source of the arrhythmia.  They performed cardiac catheterization which showed 3V CAD.  They felt transfer to [facility] was indicated for cardiothoracic surgical consultation.  Upon arrival she was started on Heparin  gtt.  Echocardiogram was obtained and showed previously repair Mitral Valve with mild MR.  There was evidence of Tricuspid Regurgitation, and EF of 55%.  She developed recurrent complaints of chest pain 8/9 described as being pressure like in quality.  This responded to Morphine .  She was also tender along LSB which would likely be expected in setting of recent ICD shock.  It was felt the patient should be placed on NTG drip and titrate as needed.  This has helped improve patient's chest pain however, she  continues to experience.  She experiences shortness of breath with activity.  She is disabled.  She is a current smoker of 1/2 ppd x 40 years.. stated she was younger than 44.  She has a family history of CAD with both parents being deceased.  She lives with her daughter who will be able to provide assistance at discharge.  Current Activity/ Functional Status: Patient is independent with mobility/ambulation, transfers, ADL's, IADL's.  DISABLED   Zubrod Score: At the time of surgery this patient's most appropriate activity status/level should be described as: []     0    Normal activity, no symptoms [x]     1    Restricted in physical strenuous activity but ambulatory, able to do out light work []     2    Ambulatory and capable of self care, unable to do work activities, up and about                 more than 50%  Of the time                            []     3    Only limited self care, in bed greater than 50% of waking hours []     4    Completely disabled, no self care, confined to bed or chair []     5  Moribund  Past Medical History:  Diagnosis Date   Acute myocardial infarction of other lateral wall, initial episode of care 01/2013   DES CFX   Aortoiliac occlusive disease (HCC) 08/22/2016   CAD (coronary artery disease) 11/13/2011   50% ? proximal LCx stenosis per Cath at [facility]   Chest pain, atypical, muscular skelatal   01/09/2014   COVID 02/08/2020   Dyspnea    Heart failure (HCC)    HLD (hyperlipidemia)    Hypertension    Intermediate coronary syndrome (HCC)    LBBB (left bundle branch block)    Mitral regurgitation    Nonischemic cardiomyopathy (HCC) 01/26/2017   Presence of combination internal cardiac defibrillator (ICD) and pacemaker    PVD (peripheral vascular disease) (HCC)    Right CFA stenosis per report on 11/14/2011   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/25/2016   Edwards McArthy-Adams IMR ETLogix ring annuloplasty (Model 4100, Serial [id number], size 26) placed  via right mini thoracotomy approach   Stenosis of left subclavian artery (HCC) 08/22/2016   Stroke (HCC)    tia with no deficits    Past Surgical History:  Procedure Laterality Date   AORTIC ARCH ANGIOGRAPHY N/A 09/21/2021   Procedure: AORTIC ARCH ANGIOGRAPHY;  Surgeon: Darron Deatrice LABOR, MD;  Location: [facility] INVASIVE CV LAB;  Service: Cardiovascular;  Laterality: N/A;   APPENDECTOMY     BIV ICD INSERTION CRT-D N/A 01/24/2017   Procedure: BIV ICD INSERTION CRT-D;  Surgeon: Waddell Danelle ORN, MD;  Location: [facility] INVASIVE CV LAB;  Service: Cardiovascular;  Laterality: N/A;   CARDIAC CATHETERIZATION  04/14/2013   Non-obstructive disease, patent CFX stent   CARDIAC CATHETERIZATION N/A 09/21/2014   Procedure: Left Heart Cath and Coronary Angiography;  Surgeon: Debby LABOR Sor, MD;  Location: [facility] INVASIVE CV LAB;  Service: Cardiovascular;  Laterality: N/A;   CORONARY ANGIOPLASTY WITH STENT PLACEMENT  01/17/2013   mild disease except 99% CFX, rx with  2.5 x 28 Alpine drug-eluting stent    GROIN DEBRIDEMENT Right 04/14/2013   Procedure: Emergency Evacuation of Retroperitoneal Hematoma and Repair Right External Iliac Artery Pseudoaneurysm    ;  Surgeon: Carlin FORBES Haddock, MD;  Location: [facility] OR;  Service: Vascular;  Laterality: Right;   LEFT HEART CATHETERIZATION WITH CORONARY ANGIOGRAM N/A 01/18/2013   Procedure: LEFT HEART CATHETERIZATION WITH CORONARY ANGIOGRAM;  Surgeon: Candyce GORMAN Reek, MD;  Location: [facility] CATH LAB;  Service: Cardiovascular;  Laterality: N/A;   LEFT HEART CATHETERIZATION WITH CORONARY ANGIOGRAM N/A 04/14/2013   Procedure: LEFT HEART CATHETERIZATION WITH CORONARY ANGIOGRAM;  Surgeon: Candyce GORMAN Reek, MD;  Location: [facility] CATH LAB;  Service: Cardiovascular;  Laterality: N/A;   MITRAL VALVE REPAIR Right 08/25/2016   Procedure: MINIMALLY INVASIVE MITRAL VALVE REPAIR;  Surgeon: Dusty Sudie DEL, MD;  Location: [facility] OR;  Service: Open Heart Surgery;  Laterality: Right;   MULTIPLE EXTRACTIONS WITH  ALVEOLOPLASTY N/A 08/23/2016   Procedure: Extraction of tooth #'s 17, 20-23, 26-29 with alveoloplasty;  Surgeon: Cyndee Tanda FALCON, DDS;  Location: [facility] OR;  Service: Oral Surgery;  Laterality: N/A;   PERCUTANEOUS CORONARY STENT INTERVENTION (PCI-S)  01/18/2013   Procedure: PERCUTANEOUS CORONARY STENT INTERVENTION (PCI-S);  Surgeon: Candyce GORMAN Reek, MD;  Location: [facility] CATH LAB;  Service: Cardiovascular;;   RIGHT/LEFT HEART CATH AND CORONARY ANGIOGRAPHY N/A 08/21/2016   Procedure: RIGHT/LEFT HEART CATH AND CORONARY ANGIOGRAPHY;  Surgeon: Verlin Lonni BIRCH, MD;  Location: [facility] INVASIVE CV LAB;  Service: Cardiovascular;  Laterality: N/A;   TEE WITHOUT CARDIOVERSION N/A 03/06/2014  Procedure: TRANSESOPHAGEAL ECHOCARDIOGRAM (TEE);  Surgeon: Leim VEAR Moose, MD;  Location: [facility] ENDOSCOPY;  Service: Cardiovascular;  Laterality: N/A;   TEE WITHOUT CARDIOVERSION N/A 08/18/2016   Procedure: TRANSESOPHAGEAL ECHOCARDIOGRAM (TEE);  Surgeon: Pietro Redell RAMAN, MD;  Location: [facility] ENDOSCOPY;  Service: Cardiovascular;  Laterality: N/A;   TEE WITHOUT CARDIOVERSION N/A 08/25/2016   Procedure: TRANSESOPHAGEAL ECHOCARDIOGRAM (TEE);  Surgeon: Dusty Sudie VEAR, MD;  Location: [facility] OR;  Service: Open Heart Surgery;  Laterality: N/A;   TUBAL LIGATION     UPPER EXTREMITY INTERVENTION  09/21/2021   Procedure: UPPER EXTREMITY INTERVENTION;  Surgeon: Darron Deatrice LABOR, MD;  Location: [facility] INVASIVE CV LAB;  Service: Cardiovascular;;    Social History   Tobacco Use  Smoking Status Every Day   Current packs/day: 0.50   Types: Cigarettes  Smokeless Tobacco Never    Social History   Substance and Sexual Activity  Alcohol  Use No   Alcohol /week: 0.0 standard drinks of alcohol      Allergies  Allergen Reactions   Aldomet [Methyldopa] Other (See Comments)    Severe hypotension   Brilinta  [Ticagrelor ] Shortness Of Breath   Other Other (See Comments)    Nuclear Stress Test Medication caused seizures   Coumadin  [Warfarin  Sodium] Swelling    Arm swelling   Desyrel [Trazodone] Other (See Comments)    Could not wake up from medication, comatosed   Eliquis [Apixaban] Other (See Comments)    BP got too low   Entresto  [Sacubitril -Valsartan ] Hypertension    Hypotension    Zestril  [Lisinopril ]     Significant blood pressure fluctuations    Penicillins Rash    Current Facility-Administered Medications  Medication Dose Route Frequency Provider Last Rate Last Admin   acetaminophen  (TYLENOL ) tablet 1,000 mg  1,000 mg Oral Q8H Floretta Mallard, MD   1,000 mg at 08/13/23 1303   albuterol  (PROVENTIL ) (2.5 MG/3ML) 0.083% nebulizer solution 2.5 mg  2.5 mg Nebulization Q4H PRN Reome, Earle J, RPH       aspirin  EC tablet 81 mg  81 mg Oral Daily Goodrich, Callie E, PA-C   81 mg at 08/13/23 0955   carvedilol  (COREG ) tablet 3.125 mg  3.125 mg Oral BID WC Goodrich, Callie E, PA-C   3.125 mg at 08/13/23 9043   ezetimibe  (ZETIA ) tablet 10 mg  10 mg Oral Daily Alvan Dorn FALCON, MD   10 mg at 08/13/23 0956   heparin  ADULT infusion 100 units/mL (25000 units/250mL)  650 Units/hr Intravenous Continuous Alvan Dorn FALCON, MD 6.5 mL/hr at 08/12/23 0819 650 Units/hr at 08/12/23 0819   lidocaine  (LIDODERM ) 5 % 1 patch  1 patch Transdermal Q24H Floretta Mallard, MD   1 patch at 08/12/23 2001   losartan  (COZAAR ) tablet 100 mg  100 mg Oral Daily Goodrich, Callie E, PA-C   100 mg at 08/13/23 9043   morphine  (PF) 2 MG/ML injection 2 mg  2 mg Intravenous Q4H PRN Alvan Dorn FALCON, MD   2 mg at 08/13/23 1302   morphine  (PF) 2 MG/ML injection 2 mg  2 mg Intravenous Once Almetta Donnice LABOR, MD       nitroGLYCERIN  50 mg in dextrose  5 % 250 mL (0.2 mg/mL) infusion  2-200 mcg/min Intravenous Titrated Floretta Mallard, MD 18 mL/hr at 08/13/23 1307 60 mcg/min at 08/13/23 1307   ondansetron  (ZOFRAN ) injection 4 mg  4 mg Intravenous Q6H PRN Goodrich, Callie E, PA-C   4 mg at 08/13/23 1303   pantoprazole  (PROTONIX ) EC tablet 40 mg  40 mg Oral Daily  Goodrich, Callie E, PA-C   40 mg at 08/13/23 9044   rosuvastatin  (CRESTOR ) tablet 40 mg  40 mg Oral Daily Branch, Jonathan F, MD   40 mg at 08/13/23 9044   spironolactone  (ALDACTONE ) tablet 25 mg  25 mg Oral Daily Henry Shaver B, NP   25 mg at 08/13/23 0955   traMADol  (ULTRAM ) tablet 50 mg  50 mg Oral Q12H PRN Henry Shaver NOVAK, NP        Medications Prior to Admission  Medication Sig Dispense Refill Last Dose/Taking   amLODipine  (NORVASC ) 10 MG tablet Take 1 tablet (10 mg total) by mouth daily. 30 tablet 3 Past Week   aspirin  EC 81 MG tablet Take 81 mg by mouth daily.   Past Week   atorvastatin  (LIPITOR ) 80 MG tablet Take 1 tablet (80 mg total) by mouth daily. 90 tablet 3 Past Week   Bempedoic Acid  (NEXLETOL ) 180 MG TABS Take 1 tablet (180 mg total) by mouth daily. 90 tablet 3 Past Week   clopidogrel  (PLAVIX ) 75 MG tablet TAKE 1 TABLET BY MOUTH DAILY 90 tablet 3 Past Week   cyclobenzaprine  (FLEXERIL ) 10 MG tablet Take 1 tablet (10 mg total) by mouth 3 (three) times daily as needed for muscle spasms. 10 tablet 0 Past Month   furosemide  (LASIX ) 40 MG tablet Take 1 tablet (40 mg total) by mouth daily as needed for fluid or edema (shortness of breath). 90 tablet 3 Unknown   losartan  (COZAAR ) 100 MG tablet Take 1 tablet (100 mg total) by mouth daily. 90 tablet 3 Past Week   meloxicam (MOBIC) 15 MG tablet Take 15 mg by mouth daily as needed for pain.   Unknown   Menthol-Methyl Salicylate (SALONPAS PAIN RELIEF PATCH EX) Apply 1 patch topically daily as needed (pain).   Past Week   metoprolol  succinate (TOPROL -XL) 50 MG 24 hr tablet Take 2 tablets (100 mg total) by mouth daily. 180 tablet 3 08/09/2023   spironolactone  (ALDACTONE ) 25 MG tablet Take 0.5 tablets (12.5 mg total) by mouth daily. 45 tablet 3 Past Week   VENTOLIN  HFA 108 (90 Base) MCG/ACT inhaler Inhale 1-2 puffs into the lungs every 4 (four) hours as needed for shortness of breath. 1 each 0 Past Week    Family History  Problem Relation  Age of Onset   CAD Mother 55   Lung cancer Mother    Bladder Cancer Mother    Stroke Mother    Heart disease Mother        Before age 83   Hypertension Mother    Heart attack Mother    CAD Father 32   Heart disease Father        Before age 71   Heart attack Father    Stroke Father        Bleeding stroke     Review of Systems:   ROS    Cardiac Review of Systems: Y or  [    ]= no  Chest Pain [ Y   ]  Resting SOB [   ] Exertional SOB  Davis.Dad  ]  Orthopnea [  ]   Pedal Edema [ N  ]    Palpitations [Y  ] Syncope  [  ]   Presyncope [ N  ]  General Review of Systems: [Y] = yes [  ]=no Constitional: recent weight change [  ]; anorexia [  ]; fatigue [Y  ]; nausea [ y recently, not on presentation ]; night sweats [  ];  fever [  ]; or chills [  ]                                                               Dental: Last Dentist visit: dentures  Eye : blurred vision [  ]; diplopia [   ]; vision changes [  ];  Amaurosis fugax[  ]; Resp: cough [ N ];  wheezing[  ];  hemoptysis[  ]; shortness of breath[  ]; paroxysmal nocturnal dyspnea[  ]; dyspnea on exertion[Y  ]; or orthopnea[  ];  GI:  gallstones[  ], vomiting[ N ];  dysphagia[  ]; melena[  ];  hematochezia [  ]; heartburn[  ];   Hx of  Colonoscopy[  ]; GU: kidney stones [  ]; hematuria[  ];   dysuria [  ];  nocturia[  ];  history of     obstruction [  ]; urinary frequency [  ]             Skin: rash, swelling[  ];, hair loss[  ];  peripheral edema[N  ];  or itching[  ]; Musculosketetal: myalgias[  ];  joint swelling[  ];  joint erythema[  ];  joint pain[  N];  back pain[  ];  Heme/Lymph: bruising[  ];  bleeding[  ];  anemia[  ];  Neuro: TIA[  ];  headaches[  ];  stroke[ Y ];  vertigo[  ];  seizures[  ];   paresthesias[  ];  difficulty walking[N  ];  Psych:depression[ N ]; anxiety[N  ];  Endocrine: diabetes[ N ];  thyroid dysfunction[ N ];  Physical Exam: BP (!) 156/86 (BP Location: Left Arm)   Pulse (!) 55   Temp 97.6 F (36.4 C) (Oral)    Resp 20   Ht 5' 1 (1.549 m)   Wt 50.2 kg   SpO2 99%   BMI 20.91 kg/m   General appearance: alert, cooperative, and no distress Head: Normocephalic, without obvious abnormality, atraumatic Neck: no adenopathy, no carotid bruit, no JVD, supple, symmetrical, trachea midline, and thyroid not enlarged, symmetric, no tenderness/mass/nodules Resp: clear to auscultation bilaterally Cardio: regular rate and rhythm and paced GI: soft, non-tender; bowel sounds normal; no masses,  no organomegaly Extremities: extremities normal, atraumatic, no cyanosis or edema Neurologic: Grossly normal  Diagnostic Studies & Laboratory data:     Recent Radiology Findings:   ECHOCARDIOGRAM COMPLETE Result Date: 08/12/2023    ECHOCARDIOGRAM REPORT   Patient Name:   Lindsay Camacho Date of Exam: 08/12/2023 Medical Rec #:  [id number]     Height:       61.0 in Accession #:    [id number]    Weight:       110.7 lb Date of Birth:  Nov 05, 1965     BSA:          1.469 m Patient Age:    58 years      BP:           115/66 mmHg Patient Gender: F             HR:           53 bpm. Exam Location:  Inpatient Procedure: 2D Echo (Both Spectral and Color Flow Doppler were utilized during  procedure). Indications:    cad of native vessel  History:        Patient has prior history of Echocardiogram examinations, most                 recent 12/30/2022. CAD, Defibrillator, Arrythmias:LBBB,                 Signs/Symptoms:Chest Pain; Risk Factors:Current Smoker.                  Mitral Valve: 26 mm Edwards Mcarthy Adams prosthetic                 annuloplasty ring valve is present in the mitral position.                 Procedure Date: 08/25/16.  Sonographer:    Tinnie Barefoot RDCS Referring Phys: [id number] ROLLO JONELLE LOUDER IMPRESSIONS  1. Left ventricular ejection fraction, by estimation, is 55 to 60%. The left ventricle has normal function. The left ventricle has no regional wall motion abnormalities. There is mild left ventricular  hypertrophy. Left ventricular diastolic parameters are indeterminate.  2. Right ventricular systolic function is normal. The right ventricular size is normal. There is mildly elevated pulmonary artery systolic pressure.  3. Left atrial size was mildly dilated.  4. The mitral valve is abnormal. Mild mitral valve regurgitation. Mild mitral stenosis. The mean mitral valve gradient is 4.0 mmHg. Moderate mitral annular calcification. There is a 26 mm Edwards Mcarthy Adams prosthetic annuloplasty ring present in the  mitral position. Procedure Date: 08/25/16.  5. The tricuspid valve is abnormal. Tricuspid valve regurgitation is moderate.  6. The aortic valve is tricuspid. Aortic valve regurgitation is mild to moderate. No aortic stenosis is present.  7. The inferior vena cava is dilated in size with >50% respiratory variability, suggesting right atrial pressure of 8 mmHg. FINDINGS  Left Ventricle: Left ventricular ejection fraction, by estimation, is 55 to 60%. The left ventricle has normal function. The left ventricle has no regional wall motion abnormalities. The left ventricular internal cavity size was normal in size. There is  mild left ventricular hypertrophy. Left ventricular diastolic parameters are indeterminate. Right Ventricle: The right ventricular size is normal. Right vetricular wall thickness was not well visualized. Right ventricular systolic function is normal. There is mildly elevated pulmonary artery systolic pressure. The tricuspid regurgitant velocity  is 2.97 m/s, and with an assumed right atrial pressure of 8 mmHg, the estimated right ventricular systolic pressure is 43.3 mmHg. Left Atrium: Left atrial size was mildly dilated. Right Atrium: Right atrial size was normal in size. Pericardium: There is no evidence of pericardial effusion. Mitral Valve: The mitral valve is abnormal. There is mild thickening of the mitral valve leaflet(s). There is mild calcification of the mitral valve leaflet(s).  Moderate mitral annular calcification. Mild mitral valve regurgitation. There is a 26 mm Edwards Mcarthy Adams prosthetic annuloplasty ring present in the mitral position. Procedure Date: 08/25/16. Mild mitral valve stenosis. MV peak gradient, 10.5 mmHg. The mean mitral valve gradient is 4.0 mmHg. Tricuspid Valve: The tricuspid valve is abnormal. Tricuspid valve regurgitation is moderate . No evidence of tricuspid stenosis. Aortic Valve: The aortic valve is tricuspid. There is mild aortic valve annular calcification. Aortic valve regurgitation is mild to moderate. Aortic regurgitation PHT measures 666 msec. No aortic stenosis is present. Aortic valve mean gradient measures 6.2 mmHg. Aortic valve peak gradient measures 14.9 mmHg. Aortic valve area, by VTI measures 1.70 cm. Pulmonic Valve: The pulmonic  valve was not well visualized. Pulmonic valve regurgitation is not visualized. No evidence of pulmonic stenosis. Aorta: The aortic root and ascending aorta are structurally normal, with no evidence of dilitation. Venous: The inferior vena cava is dilated in size with greater than 50% respiratory variability, suggesting right atrial pressure of 8 mmHg. IAS/Shunts: No atrial level shunt detected by color flow Doppler. Additional Comments: A device lead is visualized in the right atrium and right ventricle.  LEFT VENTRICLE PLAX 2D LVIDd:         3.80 cm LVIDs:         2.90 cm LV PW:         1.10 cm LV IVS:        1.10 cm LVOT diam:     1.90 cm LV SV:         65 LV SV Index:   45 LVOT Area:     2.84 cm  LV Volumes (MOD) LV vol d, MOD A4C: 66.2 ml LV vol s, MOD A4C: 37.9 ml LV SV MOD A4C:     66.2 ml RIGHT VENTRICLE             IVC RV Basal diam:  3.00 cm     IVC diam: 2.20 cm RV S prime:     11.90 cm/s TAPSE (M-mode): 1.6 cm LEFT ATRIUM             Index        RIGHT ATRIUM           Index LA diam:        4.00 cm 2.72 cm/m   RA Area:     14.10 cm LA Vol (A2C):   46.7 ml 31.80 ml/m  RA Volume:   32.40 ml  22.06 ml/m LA  Vol (A4C):   50.4 ml 34.31 ml/m LA Biplane Vol: 50.8 ml 34.59 ml/m  AORTIC VALVE AV Area (Vmax):    1.57 cm AV Area (Vmean):   1.69 cm AV Area (VTI):     1.70 cm AV Vmax:           192.75 cm/s AV Vmean:          112.088 cm/s AV VTI:            0.384 m AV Peak Grad:      14.9 mmHg AV Mean Grad:      6.2 mmHg LVOT Vmax:         107.00 cm/s LVOT Vmean:        67.000 cm/s LVOT VTI:          0.231 m LVOT/AV VTI ratio: 0.60 AI PHT:            666 msec  AORTA Ao Root diam: 2.40 cm Ao Asc diam:  2.80 cm MITRAL VALVE            TRICUSPID VALVE MV Peak grad: 10.5 mmHg TR Peak grad:   35.3 mmHg MV Mean grad: 4.0 mmHg  TR Vmax:        297.00 cm/s MV Vmax:      1.62 m/s MV Vmean:     94.8 cm/s SHUNTS                         Systemic VTI:  0.23 m                         Systemic Diam: 1.90 cm  Dorn Ross MD Electronically signed by Dorn Ross MD Signature Date/Time: 08/12/2023/12:47:32 PM    Final     I have independently reviewed the above radiologic studies and discussed with the patient   Recent Lab Findings: Lab Results  Component Value Date   WBC 7.9 08/13/2023   HGB 12.7 08/13/2023   HCT 38.6 08/13/2023   PLT 144 (L) 08/13/2023   GLUCOSE 85 08/13/2023   CHOL 221 (H) 08/11/2023   TRIG 51 08/11/2023   HDL 49 08/11/2023   LDLCALC 162 (H) 08/11/2023   ALT 13 08/10/2023   AST 25 08/10/2023   NA 139 08/13/2023   K 3.9 08/13/2023   CL 108 08/13/2023   CREATININE 0.95 08/13/2023   BUN 23 (H) 08/13/2023   CO2 24 08/13/2023   TSH 1.700 01/21/2017   INR 1.03 01/21/2017   HGBA1C 5.2 03/14/2022    Assessment / Plan:      CAD- presented with angina, ruled in for NSTEMI in [place]... transferred to [facility] for cardiothoracic consultation Mitral Regurgitation- S/P MI Repair by Dr. Dusty in 2018 H/O PPM/ICD placed in 2019.SABRA Chronic HFrEF.. improved up to 55% HLD H/O Subclavian Stenosis.. S/P Stenting  Patient is currently having intermittent chest pain relieved with NTG/Morphine  and  Lidocaine  Patch.  She is on Plavix  prior to admission and her last dose was on 8/8.  Her previous valve repair appears to be doing well with only Mild MR.  Consultation is requested for coronary bypass grafting.  She appears to be a reasonable candidate.  Of note she is left handed if radial artery would be possible conduit.  She is scared about having surgery, but is agreeable to proceed.  Dr. Shyrl and Dr. Kerrin are aware of the patient.  One of the surgeons will evaluate the patient and follow up with recommendations.  First OR availability is on Thursday.   I  spent 40 minutes counseling the patient face to face.   Rocky Shad, PA-C 08/13/2023 1:11 PM   I have reviewed Ms. Otoole' records and cath images, and have seen and examined her personally.  58 yo woman with history of hypertension, hyperlipidemia, tobacco abuse, mitral repair, ICD, and extensive PAD presented with CP complicated by V fib arrest (converted with ICD).  Cath shows severe 3 vessel CAD.  Currently angina free on heparin  and NTG drips, but c/o headache and chest wall pain.  CABG indicated for survival benefit and relief of symptoms.   I discussed the general nature of the procedure, including the need for general anesthesia, the incisions to be used, the use of cardiopulmonary bypass, and the use of temporary pacemaker wires and drainage tubes postoperatively with Ms. Zaugg.  We discussed the expected hospital stay, overall recovery and short and long term outcomes. I informed her of the indications, risks, benefits and alternatives.   She understands the risks include, but are not limited to death, stroke, MI, DVT/PE, bleeding, possible need for transfusion, infections, cardiac arrhythmias, displacement of pacer/ ICD leads, as well as other organ system dysfunction including respiratory, renal, or GI complications.    Tentatively plan CABG Thursday 8/14 to allow Plavix  washout  Elspeth C. Kerrin, MD [facility] [phone]

## 2023-08-14 ENCOUNTER — Encounter (HOSPITAL_COMMUNITY)

## 2023-08-14 ENCOUNTER — Encounter (HOSPITAL_COMMUNITY): Payer: Self-pay | Admitting: Cardiology

## 2023-08-14 DIAGNOSIS — E785 Hyperlipidemia, unspecified: Secondary | ICD-10-CM | POA: Diagnosis not present

## 2023-08-14 DIAGNOSIS — I771 Stricture of artery: Secondary | ICD-10-CM | POA: Diagnosis not present

## 2023-08-14 DIAGNOSIS — I25110 Atherosclerotic heart disease of native coronary artery with unstable angina pectoris: Secondary | ICD-10-CM | POA: Diagnosis not present

## 2023-08-14 DIAGNOSIS — I214 Non-ST elevation (NSTEMI) myocardial infarction: Secondary | ICD-10-CM | POA: Diagnosis not present

## 2023-08-14 LAB — BASIC METABOLIC PANEL WITH GFR
Anion gap: 8 (ref 5–15)
BUN: 13 mg/dL (ref 6–20)
CO2: 25 mmol/L (ref 22–32)
Calcium: 8.6 mg/dL — ABNORMAL LOW (ref 8.9–10.3)
Chloride: 103 mmol/L (ref 98–111)
Creatinine, Ser: 0.97 mg/dL (ref 0.44–1.00)
GFR, Estimated: >60 mL/min (ref >60)
Glucose, Bld: 88 mg/dL (ref 70–99)
Potassium: 3.8 mmol/L (ref 3.5–5.1)
Sodium: 136 mmol/L (ref 135–145)

## 2023-08-14 LAB — SURGICAL PCR SCREEN
MRSA, PCR: NEGATIVE
Staphylococcus aureus: NEGATIVE

## 2023-08-14 LAB — CBC
HCT: 40.1 % (ref 36.0–46.0)
Hemoglobin: 13.2 g/dL (ref 12.0–15.0)
MCH: 28.9 pg (ref 26.0–34.0)
MCHC: 32.9 g/dL (ref 30.0–36.0)
MCV: 87.7 fL (ref 80.0–100.0)
Platelets: 165 K/uL (ref 150–400)
RBC: 4.57 MIL/uL (ref 3.87–5.11)
RDW: 13.9 % (ref 11.5–15.5)
WBC: 6.9 K/uL (ref 4.0–10.5)
nRBC: 0 % (ref 0.0–0.2)

## 2023-08-14 LAB — HEPARIN LEVEL (UNFRACTIONATED): Heparin Unfractionated: 0.23 [IU]/mL — ABNORMAL LOW (ref 0.30–0.70)

## 2023-08-14 LAB — URINALYSIS, ROUTINE W REFLEX MICROSCOPIC
Bilirubin Urine: NEGATIVE
Glucose, UA: NEGATIVE mg/dL
Hgb urine dipstick: NEGATIVE
Ketones, ur: NEGATIVE mg/dL
Leukocytes,Ua: NEGATIVE
Nitrite: NEGATIVE
Protein, ur: NEGATIVE mg/dL
Specific Gravity, Urine: 1.009 (ref 1.005–1.030)
pH: 5 (ref 5.0–8.0)

## 2023-08-14 LAB — MAGNESIUM: Magnesium: 1.6 mg/dL — ABNORMAL LOW (ref 1.7–2.4)

## 2023-08-14 MED ORDER — MAGNESIUM SULFATE 4 GM/100ML IV SOLN
4.0000 g | Freq: Once | INTRAVENOUS | Status: AC
  Administered 2023-08-14 (×2): 4 g via INTRAVENOUS
  Filled 2023-08-14: qty 100

## 2023-08-14 MED ORDER — AMLODIPINE BESYLATE 5 MG PO TABS
5.0000 mg | ORAL_TABLET | Freq: Every day | ORAL | Status: DC
  Administered 2023-08-14 – 2023-08-15 (×4): 5 mg via ORAL
  Filled 2023-08-14 (×2): qty 1

## 2023-08-14 NOTE — Plan of Care (Signed)
  Problem: Clinical Measurements: Goal: Will remain free from infection Outcome: Progressing Goal: Respiratory complications will improve Outcome: Progressing Goal: Cardiovascular complication will be avoided Outcome: Progressing   Problem: Activity: Goal: Risk for activity intolerance will decrease Outcome: Progressing   Problem: Pain Managment: Goal: General experience of comfort will improve and/or be controlled Outcome: Progressing   Problem: Safety: Goal: Ability to remain free from injury will improve Outcome: Progressing   Problem: Activity: Goal: Ability to tolerate increased activity will improve Outcome: Progressing   Problem: Cardiac: Goal: Ability to achieve and maintain adequate cardiovascular perfusion will improve Outcome: Progressing

## 2023-08-14 NOTE — Progress Notes (Signed)
 Progress Note  Patient Name: Lindsay Camacho Date of Encounter: 08/14/2023 [facility] Cardiologist: Oneil Parchment, MD   Patient Profile   58 y.o. female with CAD with prior stenting, chronic HFrEF, ICD, MV repair, HLD, subclavian stenosis with prior stenting, HLD who was seen 08/10/2023 for the evaluation of chest pain.    Assessment & Plan   NSTEMI w/ h/o CAD & PCI of Lcx -- Prior cath procedure complicated by retroperitoneal bleeding requiring vascular surgery. Repeat left and right heart cath prior to valve surgery in August 2018 showed patent left circumflex stent with minimal restenosis and mild nonobstructive CAD in the RCA and LAD  Presented to [facility] with NSTEMI and had ICD shock while in the ED for VF -- cath reported from [facility] showed 3 vessel disease involving the LAD, LCX, RCA. Normal LVEDP. LM normal, LAD 80-90%, D1 70-80%, LCX 70-80% lesion at stent edge, OM1 60-70%, RCA prox 20-30% with diffuse haziness in the mid segment which could represent thrombus, distal RCA 99%.-Having cath films reviewed. -- Echo 8/10 shows LVEF of 55-60%, -- transferred to [facility] for evaluation for possible CABG continue IV heparin , NTG, ASA, crestor  40mg  daily, zetia  10mg  daily, losartan  100mg  daily, coreg  3.125mg  BID, increase spiro to 25mg  daily. Plavix  on hold since 8/8 Can wean down IV NTG and potentially add amlodipine  as BP increases. No longer having intermittent chest discomfort with the addition of Norco and lidocaine  patch.  I suspect that the pain was musculoskeletal. Currently on carvedilol  3.125 mm twice daily, spironolactone  25 mg daily, adding amlodipine  5 mg today and ARB discontinued for planned CABG  S/p MV repair Tricuspid regurg '18 -- mild MR/MS with mean gradient of 4.83mmHg 26 mm Edwards Mcarthy Adams prosthetic annuloplasty ring present in the  mitral position, moderate TR  ICD-medtronic  -- s/p ICD shock at [facility] 8/7 at 1327 for VF in the setting of  NSTEMI -- K+ 3.9, electrolytes stable.  HFimpEF -- initially underwent Medtronic CRT-D implant January 2019 for EF of 20 to 25%  --12/2022 echo: LVEF 55%, indeterminate diasotlic function, normal RV function, normal MV repair without valve dysfunction, severe TR, mild aortic stenosis -- echo 8/10 LVEF of 55-60%, no rWMA, mild LVH, normal RV, mild LA enlargement, mild MR/MS with mean gradient of 4.65mmHg, moderate TR -- GDMT: continue coreg  3.125mg  BID, losartan  100mg  daily, increase spiro to 25mg  daily   HLD-- LDL 162, HDL 49, LP(a) 209 -- Previously on atorvastatin  80mg  daily, previously seen in lipid clinic. Reports side effects of PCSK9i and Leqvio  -- on nexletol  PTA, continue Zetia  10mg  daily  For now continue with Crestor  40mg  daily along with Zetia  10 mg and Nexletol  80 mg daily  Hx of left subclavian stenosis/stent => status post stenting of the left subclavian-May require at a minimum upper extremity arterial Dopplers versus CT A to assess patency.  ------------------------------------------------------------------------------------------------------------------------------------------- Interval Summary    Chest pain seems to be better, mostly noting nausea.  Lying in the fetal position in bed.  Seems tired but arousable.  Vital Signs Vitals:   08/13/23 2344 08/14/23 0333 08/14/23 0345 08/14/23 0815  BP: 125/75 117/71 (!) 156/81   Pulse: (!) 59 (!) 56 (!) 56   Resp: 15 16  18   Temp: 98.6 F (37 C) 97.6 F (36.4 C)  97.7 F (36.5 C)  TempSrc: Oral Oral  Oral  SpO2: 99% 99% 99%   Weight:      Height:        Intake/Output Summary (  Last 24 hours) at 08/14/2023 1012 Last data filed at 08/14/2023 9346 Gross per 24 hour  Intake 1302.01 ml  Output --  Net 1302.01 ml      08/10/2023    5:00 PM 07/10/2023    1:59 PM 05/22/2023    4:21 PM  Last 3 Weights  Weight (lbs) 110 lb 10.7 oz 109 lb 6.4 oz 107 lb  Weight (kg) 50.2 kg 49.624 kg 48.535 kg     Cardiac Studies Cardiac  cath from [facility]: LM normal, LAD 80-90%, D1 70-80%, LCX 70-80% lesion at stent edge, OM1 60-70%, RCA prox 20-30% with diffuse haziness in the mid segment which could represent thrombus, distal RCA 99%.  (Films personally reviewed.  D1 and OM1 are relatively small caliber vessels, not likely targets for bypass, however otherwise the main LCx, PDA PL system and LAD are all favorable targets for CABG.  The RCA has enough extensive disease that would make it very difficult to consider PCI, which would mean CABG for revascularization is the best option.) Echocardiogram (08/12/2023): EF 55 to 60%.  No RWMA.  Indeterminate diastolic pressures with mildly elevated PAP.  Mild MR with mild MS (mean MV G 4 mmHg)-26 mm Edwards McCarthy prosthetic annuloplasty ring from 08/25/2016).  Moderate TR.  Mild to moderate AI with no AAS.  RAP estimated 8 mmHg.   Telemetry/ECG   Sinus rhythm, V paced - Personally Reviewed  Physical Exam  GEN: No acute distress.   Neck: No JVD Cardiac: RRR, soft systolic murmur, no rubs, or gallops.  Still has chest wall tenderness to palpation. Respiratory: Clear to auscultation bilaterally. GI: Soft, nontender, non-distended  MS: No edema    Signed, Alm Clay, MD    For questions or updates, please contact [facility] Please consult [email or web address] for contact info under        Alm MICAEL Clay, MD, MS Alm Clay, M.D., M.S. Interventional Chartered certified accountant  Pager # [phone]

## 2023-08-14 NOTE — Progress Notes (Signed)
 CARDIAC REHAB PHASE I      Pre-op  OHS education including OHS booklet, OHS handout, IS use, mobility importance, home needs at discharge and sternal precautions/move in the tube reviewed. All questions and concerns addressed. Will continue to follow.   Vaughn Asberry Hacking, RN BSN 08/14/2023 1:23 PM

## 2023-08-14 NOTE — Plan of Care (Signed)
  Problem: Education: Goal: Knowledge of General Education information will improve Description: Including pain rating scale, medication(s)/side effects and non-pharmacologic comfort measures Outcome: Progressing   Problem: Health Behavior/Discharge Planning: Goal: Ability to manage health-related needs will improve Outcome: Progressing   Problem: Clinical Measurements: Goal: Ability to maintain clinical measurements within normal limits will improve Outcome: Progressing Goal: Will remain free from infection Outcome: Progressing Goal: Diagnostic test results will improve Outcome: Progressing Goal: Respiratory complications will improve Outcome: Progressing Goal: Cardiovascular complication will be avoided Outcome: Progressing   Problem: Activity: Goal: Risk for activity intolerance will decrease Outcome: Progressing   Problem: Nutrition: Goal: Adequate nutrition will be maintained Outcome: Progressing   Problem: Coping: Goal: Level of anxiety will decrease Outcome: Progressing   Problem: Elimination: Goal: Will not experience complications related to bowel motility Outcome: Progressing   Problem: Elimination: Goal: Will not experience complications related to urinary retention Outcome: Progressing   Problem: Pain Managment: Goal: General experience of comfort will improve and/or be controlled Outcome: Progressing   Problem: Safety: Goal: Ability to remain free from injury will improve Outcome: Progressing   Problem: Skin Integrity: Goal: Risk for impaired skin integrity will decrease Outcome: Progressing   Problem: Education: Goal: Understanding of cardiac disease, CV risk reduction, and recovery process will improve Outcome: Progressing Goal: Individualized Educational Video(s) Outcome: Progressing   Problem: Activity: Goal: Ability to tolerate increased activity will improve Outcome: Progressing   Problem: Cardiac: Goal: Ability to achieve and  maintain adequate cardiovascular perfusion will improve Outcome: Progressing   Problem: Health Behavior/Discharge Planning: Goal: Ability to safely manage health-related needs after discharge will improve Outcome: Progressing

## 2023-08-14 NOTE — Progress Notes (Signed)
 PHARMACY - ANTICOAGULATION CONSULT NOTE  Pharmacy Consult for heparin  Indication: chest pain/ACS  Allergies  Allergen Reactions   Aldomet [Methyldopa] Other (See Comments)    Severe hypotension   Brilinta  [Ticagrelor ] Shortness Of Breath   Other Other (See Comments)    Nuclear Stress Test Medication caused seizures   Coumadin  [Warfarin Sodium ] Swelling    Arm swelling   Desyrel [Trazodone] Other (See Comments)    Could not wake up from medication, comatosed   Eliquis [Apixaban] Other (See Comments)    BP got too low   Entresto  [Sacubitril -Valsartan ] Hypertension    Hypotension    Zestril  [Lisinopril ]     Significant blood pressure fluctuations    Penicillins Rash   Patient Measurements: Height: 5' 1 (154.9 cm) Weight: 50.2 kg (110 lb 10.7 oz) IBW/kg (Calculated) : 47.8 HEPARIN  DW (KG): 50.2  Vital Signs: Temp: 97.6 F (36.4 C) (08/12 0333) Temp Source: Oral (08/12 0333) BP: 156/81 (08/12 0345) Pulse Rate: 56 (08/12 0345)  Labs: Recent Labs    08/12/23 0412 08/12/23 1338 08/13/23 0532 08/14/23 0452  HGB 13.3  --  12.7 13.2  HCT 40.9  --  38.6 40.1  PLT 141*  --  144* 165  HEPARINUNFRC 0.71* 0.53 0.39 0.23*  CREATININE 1.21*  --  0.95 0.97   Estimated Creatinine Clearance: 47.7 mL/min (by C-G formula based on SCr of 0.97 mg/dL).  Medical History: CAD, HFrEF, ICD, MV repair, HLD, subclavian stenosis with prior stenting, HLD.   Medications:  Scheduled:   acetaminophen   1,000 mg Oral Q8H   aspirin  EC  81 mg Oral Daily   carvedilol   3.125 mg Oral BID WC   ezetimibe   10 mg Oral Daily   HYDROcodone -acetaminophen   1 tablet Oral Q6H   lidocaine   1 patch Transdermal Q24H   losartan   100 mg Oral Daily    morphine  injection  2 mg Intravenous Once   pantoprazole   40 mg Oral Daily   rosuvastatin   40 mg Oral Daily   spironolactone   25 mg Oral Daily   Infusions:   heparin  650 Units/hr (08/13/23 1826)   nitroGLYCERIN  50 mcg/min (08/14/23 0604)   Assessment: 58  YO F transferred from [facility] in [place] presenting with NSTEMI. Patient transferred to [facility] for CABG evaluation; planning for CT surgery consult pending cath films. Patient was not on anticoagulation prior to admission. Pharmacy has been consulted to dose IV heparin  for NSTEMI.    - Heparin  level subtherapeutic at 0.23 on heparin  infusion at  650 units/h.  Heparin  level 0.3-0.7 units/ml Monitor platelets by anticoagulation protocol: Yes   Plan:  Increase heparin  to 750 units/hr Monitor HL, CBC, s/sx bleeding daily.   Prentice Poisson, PharmD Clinical Pharmacist **Pharmacist phone directory can now be found on [email or web address] ([email or web address]).  Listed under [facility].

## 2023-08-15 ENCOUNTER — Inpatient Hospital Stay (HOSPITAL_COMMUNITY)

## 2023-08-15 DIAGNOSIS — E785 Hyperlipidemia, unspecified: Secondary | ICD-10-CM | POA: Diagnosis not present

## 2023-08-15 DIAGNOSIS — I5022 Chronic systolic (congestive) heart failure: Secondary | ICD-10-CM | POA: Diagnosis not present

## 2023-08-15 DIAGNOSIS — I25110 Atherosclerotic heart disease of native coronary artery with unstable angina pectoris: Secondary | ICD-10-CM | POA: Diagnosis not present

## 2023-08-15 DIAGNOSIS — I214 Non-ST elevation (NSTEMI) myocardial infarction: Secondary | ICD-10-CM | POA: Diagnosis not present

## 2023-08-15 DIAGNOSIS — I2510 Atherosclerotic heart disease of native coronary artery without angina pectoris: Secondary | ICD-10-CM

## 2023-08-15 DIAGNOSIS — I871 Compression of vein: Secondary | ICD-10-CM

## 2023-08-15 LAB — PULMONARY FUNCTION TEST
FEF 25-75 Pre: 1.3 L/s
FEF2575-%Pred-Pre: 57 %
FEV1-%Pred-Pre: 68 %
FEV1-Pre: 1.59 L
FEV1FVC-%Pred-Pre: 91 %
FEV6-%Pred-Pre: 76 %
FEV6-Pre: 2.21 L
FEV6FVC-%Pred-Pre: 103 %
FVC-%Pred-Pre: 73 %
FVC-Pre: 2.21 L
Pre FEV1/FVC ratio: 72 %
Pre FEV6/FVC Ratio: 100 %

## 2023-08-15 LAB — SARS CORONAVIRUS 2 BY RT PCR: SARS Coronavirus 2 by RT PCR: NEGATIVE

## 2023-08-15 LAB — BASIC METABOLIC PANEL WITH GFR
Anion gap: 8 (ref 5–15)
BUN: 11 mg/dL (ref 6–20)
CO2: 27 mmol/L (ref 22–32)
Calcium: 9.1 mg/dL (ref 8.9–10.3)
Chloride: 102 mmol/L (ref 98–111)
Creatinine, Ser: 0.95 mg/dL (ref 0.44–1.00)
GFR, Estimated: >60 mL/min (ref >60)
Glucose, Bld: 84 mg/dL (ref 70–99)
Potassium: 4.1 mmol/L (ref 3.5–5.1)
Sodium: 137 mmol/L (ref 135–145)

## 2023-08-15 LAB — CBC
HCT: 43.4 % (ref 36.0–46.0)
Hemoglobin: 14.5 g/dL (ref 12.0–15.0)
MCH: 29.1 pg (ref 26.0–34.0)
MCHC: 33.4 g/dL (ref 30.0–36.0)
MCV: 87 fL (ref 80.0–100.0)
Platelets: 169 K/uL (ref 150–400)
RBC: 4.99 MIL/uL (ref 3.87–5.11)
RDW: 14 % (ref 11.5–15.5)
WBC: 7.1 K/uL (ref 4.0–10.5)
nRBC: 0 % (ref 0.0–0.2)

## 2023-08-15 LAB — HEPARIN LEVEL (UNFRACTIONATED): Heparin Unfractionated: 0.44 [IU]/mL (ref 0.30–0.70)

## 2023-08-15 LAB — MAGNESIUM: Magnesium: 1.9 mg/dL (ref 1.7–2.4)

## 2023-08-15 MED ORDER — TRANEXAMIC ACID 1000 MG/10ML IV SOLN
1.5000 mg/kg/h | INTRAVENOUS | Status: AC
  Administered 2023-08-16: 1.5 mg/kg/h via INTRAVENOUS
  Filled 2023-08-15: qty 25

## 2023-08-15 MED ORDER — MAGNESIUM SULFATE 50 % IJ SOLN
40.0000 meq | INTRAMUSCULAR | Status: DC
  Filled 2023-08-15: qty 9.85

## 2023-08-15 MED ORDER — PHENYLEPHRINE HCL-NACL 20-0.9 MG/250ML-% IV SOLN
30.0000 ug/min | INTRAVENOUS | Status: AC
  Administered 2023-08-16: 25 ug/min via INTRAVENOUS
  Filled 2023-08-15: qty 250

## 2023-08-15 MED ORDER — CEFAZOLIN SODIUM-DEXTROSE 2-4 GM/100ML-% IV SOLN
2.0000 g | INTRAVENOUS | Status: AC
  Administered 2023-08-16 (×2): 2 g via INTRAVENOUS
  Filled 2023-08-15: qty 100

## 2023-08-15 MED ORDER — POTASSIUM CHLORIDE 2 MEQ/ML IV SOLN
80.0000 meq | INTRAVENOUS | Status: DC
  Filled 2023-08-15: qty 40

## 2023-08-15 MED ORDER — DEXMEDETOMIDINE HCL IN NACL 400 MCG/100ML IV SOLN
0.1000 ug/kg/h | INTRAVENOUS | Status: AC
  Administered 2023-08-16: .5 ug/kg/h via INTRAVENOUS
  Filled 2023-08-15: qty 100

## 2023-08-15 MED ORDER — ACETAMINOPHEN 500 MG PO TABS
500.0000 mg | ORAL_TABLET | Freq: Three times a day (TID) | ORAL | Status: DC
  Administered 2023-08-15 – 2023-08-16 (×3): 500 mg via ORAL
  Filled 2023-08-15 (×2): qty 1

## 2023-08-15 MED ORDER — BISACODYL 5 MG PO TBEC
5.0000 mg | DELAYED_RELEASE_TABLET | Freq: Once | ORAL | Status: AC
  Administered 2023-08-15 (×2): 5 mg via ORAL
  Filled 2023-08-15: qty 1

## 2023-08-15 MED ORDER — VANCOMYCIN HCL IN DEXTROSE 1-5 GM/200ML-% IV SOLN
1000.0000 mg | INTRAVENOUS | Status: AC
  Administered 2023-08-16: 1000 mg via INTRAVENOUS
  Filled 2023-08-15: qty 200

## 2023-08-15 MED ORDER — CHLORHEXIDINE GLUCONATE CLOTH 2 % EX PADS: 6.0000 | MEDICATED_PAD | Freq: Once | CUTANEOUS | Status: DC

## 2023-08-15 MED ORDER — TRANEXAMIC ACID (OHS) PUMP PRIME SOLUTION
2.0000 mg/kg | INTRAVENOUS | Status: DC
  Filled 2023-08-15: qty 1

## 2023-08-15 MED ORDER — NOREPINEPHRINE 4 MG/250ML-% IV SOLN
0.0000 ug/min | INTRAVENOUS | Status: DC
  Filled 2023-08-15: qty 250

## 2023-08-15 MED ORDER — EPINEPHRINE HCL 5 MG/250ML IV SOLN IN NS
0.0000 ug/min | INTRAVENOUS | Status: AC
  Administered 2023-08-16: 3 ug/min via INTRAVENOUS
  Filled 2023-08-15: qty 250

## 2023-08-15 MED ORDER — HEPARIN 30,000 UNITS/1000 ML (OHS) CELLSAVER SOLUTION
Status: DC
  Filled 2023-08-15: qty 1000

## 2023-08-15 MED ORDER — CHLORHEXIDINE GLUCONATE 0.12 % MT SOLN
15.0000 mL | Freq: Once | OROMUCOSAL | Status: AC
  Administered 2023-08-16: 15 mL via OROMUCOSAL
  Filled 2023-08-15: qty 15

## 2023-08-15 MED ORDER — CHLORHEXIDINE GLUCONATE CLOTH 2 % EX PADS
6.0000 | MEDICATED_PAD | Freq: Once | CUTANEOUS | Status: AC
  Administered 2023-08-15 (×2): 6 via TOPICAL

## 2023-08-15 MED ORDER — TRANEXAMIC ACID (OHS) BOLUS VIA INFUSION
15.0000 mg/kg | INTRAVENOUS | Status: AC
  Administered 2023-08-16: 753 mg via INTRAVENOUS
  Filled 2023-08-15: qty 753

## 2023-08-15 MED ORDER — NITROGLYCERIN IN D5W 200-5 MCG/ML-% IV SOLN
2.0000 ug/min | INTRAVENOUS | Status: DC
  Filled 2023-08-15: qty 250

## 2023-08-15 MED ORDER — MILRINONE LACTATE IN DEXTROSE 20-5 MG/100ML-% IV SOLN
0.3000 ug/kg/min | INTRAVENOUS | Status: AC
  Administered 2023-08-16: .25 ug/kg/min via INTRAVENOUS
  Filled 2023-08-15: qty 100

## 2023-08-15 MED ORDER — PLASMA-LYTE A IV SOLN
INTRAVENOUS | Status: DC
  Filled 2023-08-15: qty 2.5

## 2023-08-15 MED ORDER — VANCOMYCIN HCL 1250 MG/250ML IV SOLN
1250.0000 mg | INTRAVENOUS | Status: DC
  Filled 2023-08-15: qty 250

## 2023-08-15 MED ORDER — INSULIN REGULAR(HUMAN) IN NACL 100-0.9 UT/100ML-% IV SOLN
INTRAVENOUS | Status: DC
  Filled 2023-08-15: qty 100

## 2023-08-15 MED ORDER — METOPROLOL TARTRATE 12.5 MG HALF TABLET
12.5000 mg | ORAL_TABLET | Freq: Once | ORAL | Status: AC
  Administered 2023-08-16: 12.5 mg via ORAL
  Filled 2023-08-15: qty 1

## 2023-08-15 MED ORDER — CEFAZOLIN SODIUM-DEXTROSE 2-4 GM/100ML-% IV SOLN
2.0000 g | INTRAVENOUS | Status: DC
  Filled 2023-08-15: qty 100

## 2023-08-15 MED ORDER — MAGNESIUM SULFATE 2 GM/50ML IV SOLN
2.0000 g | Freq: Once | INTRAVENOUS | Status: AC
  Administered 2023-08-15 (×2): 2 g via INTRAVENOUS
  Filled 2023-08-15: qty 50

## 2023-08-15 NOTE — Plan of Care (Signed)

## 2023-08-15 NOTE — Progress Notes (Signed)
 Procedure(s) (LRB): CORONARY ARTERY BYPASS GRAFTING (CABG) (N/A) ECHOCARDIOGRAM, TRANSESOPHAGEAL (N/A) Subjective: C/o headache, chest still sore  Objective: Vital signs in last 24 hours: Temp:  [97.4 F (36.3 C)-98 F (36.7 C)] 97.4 F (36.3 C) (08/13 0745) Pulse Rate:  [55-62] 58 (08/13 0745) Cardiac Rhythm: Ventricular paced (08/13 0754) Resp:  [16-19] 16 (08/13 0745) BP: (113-132)/(59-76) 120/66 (08/13 0745) SpO2:  [97 %-98 %] 97 % (08/13 0745)  Hemodynamic parameters for last 24 hours:    Intake/Output from previous day: 08/12 0701 - 08/13 0700 In: 240 [P.O.:240] Out: 550 [Urine:550] Intake/Output this shift: No intake/output data recorded.  General appearance: alert and mild distress Neurologic: intact Heart: regular rate and rhythm Lungs: clear to auscultation bilaterally  Lab Results: Recent Labs    08/14/23 0452 08/15/23 0407  WBC 6.9 7.1  HGB 13.2 14.5  HCT 40.1 43.4  PLT 165 169   BMET:  Recent Labs    08/14/23 0452 08/15/23 0407  NA 136 137  K 3.8 4.1  CL 103 102  CO2 25 27  GLUCOSE 88 84  BUN 13 11  CREATININE 0.97 0.95  CALCIUM  8.6* 9.1    PT/INR: No results for input(s): LABPROT, INR in the last 72 hours. ABG    Component Value Date/Time   PHART 7.355 08/26/2016 0413   HCO3 24.5 08/26/2016 0413   TCO2 26 08/26/2016 1532   ACIDBASEDEF 1.0 08/26/2016 0413   O2SAT 74.4 08/28/2016 0420   CBG (last 3)  No results for input(s): GLUCAP in the last 72 hours.  Assessment/Plan: S/P Procedure(s) (LRB): CORONARY ARTERY BYPASS GRAFTING (CABG) (N/A) ECHOCARDIOGRAM, TRANSESOPHAGEAL (N/A) Severe 3 vessel CAD For CABG tomorrow Left handed, right radial not usable (vascular lab) All questions answered   LOS: 5 days    [name] 08/15/2023

## 2023-08-15 NOTE — Progress Notes (Signed)
 PHARMACY - ANTICOAGULATION CONSULT NOTE  Pharmacy Consult for heparin  Indication: chest pain/ACS  Allergies  Allergen Reactions   Aldomet [Methyldopa] Other (See Comments)    Severe hypotension   Brilinta  [Ticagrelor ] Shortness Of Breath   Other Other (See Comments)    Nuclear Stress Test Medication caused seizures   Coumadin  [Warfarin Sodium ] Swelling    Arm swelling   Desyrel [Trazodone] Other (See Comments)    Could not wake up from medication, comatosed   Eliquis [Apixaban] Other (See Comments)    BP got too low   Entresto  [Sacubitril -Valsartan ] Hypertension    Hypotension    Zestril  [Lisinopril ]     Significant blood pressure fluctuations    Penicillins Rash   Patient Measurements: Height: 5' 1 (154.9 cm) Weight: 50.2 kg (110 lb 10.7 oz) IBW/kg (Calculated) : 47.8 HEPARIN  DW (KG): 50.2  Vital Signs: Temp: 97.8 F (36.6 C) (08/13 0408) Temp Source: Oral (08/13 0408) BP: 113/59 (08/13 0408) Pulse Rate: 55 (08/13 0408)  Labs: Recent Labs    08/13/23 0532 08/14/23 0452 08/15/23 0407  HGB 12.7 13.2 14.5  HCT 38.6 40.1 43.4  PLT 144* 165 169  HEPARINUNFRC 0.39 0.23* 0.44  CREATININE 0.95 0.97 0.95   Estimated Creatinine Clearance: 48.7 mL/min (by C-G formula based on SCr of 0.95 mg/dL).  Medical History: CAD, HFrEF, ICD, MV repair, HLD, subclavian stenosis with prior stenting, HLD.   Medications:  Scheduled:   acetaminophen   1,000 mg Oral Q8H   amLODipine   5 mg Oral Daily   aspirin  EC  81 mg Oral Daily   carvedilol   3.125 mg Oral BID WC   ezetimibe   10 mg Oral Daily   HYDROcodone -acetaminophen   1 tablet Oral Q6H   lidocaine   1 patch Transdermal Q24H   pantoprazole   40 mg Oral Daily   rosuvastatin   40 mg Oral Daily   spironolactone   25 mg Oral Daily   Infusions:   heparin  750 Units/hr (08/14/23 0730)   nitroGLYCERIN  5 mcg/min (08/14/23 1737)   Assessment: 58 YO F transferred from [facility] in [place] presenting with NSTEMI. Patient  transferred to [facility] for CABG evaluation; planning for CT surgery consult pending cath films. Patient was not on anticoagulation prior to admission. Pharmacy has been consulted to dose IV heparin  for NSTEMI.    - Heparin  level therapeutic at 0.44 on heparin  infusion at 750 units/h. Plans noted for CABG on 8/14.  Heparin  level 0.3-0.7 units/ml Monitor platelets by anticoagulation protocol: Yes   Plan:  Continue heparin  at 750 units/hr Monitor HL, CBC, s/sx bleeding daily.    Shatia Sindoni, Student-PharmD [employer] 2028

## 2023-08-15 NOTE — Progress Notes (Addendum)
 Progress Note  Patient Name: Lindsay Camacho Date of Encounter: 08/15/2023  Primary Cardiologist: Oneil Parchment, MD  Subjective   Feeling rough, displeased with nutrition team over issues ordering pinto beans. Notes ongoing chest soreness unchanged from admission though does get relief with scheduled morphine  and lidocaine  patch.  Nausea notably improved.  Still has soreness in her chest.  Not sleeping well.  Just cannot get comfortable.  Change in the nitroglycerin  did not change the symptoms in her chest. On less nitroglycerin , with no change to symptoms.  Not having chest pressure is more soreness that is tender to palpation.  Inpatient Medications    Scheduled Meds:  acetaminophen   1,000 mg Oral Q8H   amLODipine   5 mg Oral Daily   aspirin  EC  81 mg Oral Daily   carvedilol   3.125 mg Oral BID WC   ezetimibe   10 mg Oral Daily   HYDROcodone -acetaminophen   1 tablet Oral Q6H   lidocaine   1 patch Transdermal Q24H   pantoprazole   40 mg Oral Daily   rosuvastatin   40 mg Oral Daily   spironolactone   25 mg Oral Daily   Continuous Infusions:  heparin  750 Units/hr (08/15/23 0756)   nitroGLYCERIN  5 mcg/min (08/14/23 1737)   PRN Meds: albuterol , morphine  injection, ondansetron  (ZOFRAN ) IV   Vital Signs    Vitals:   08/14/23 1924 08/14/23 2315 08/15/23 0408 08/15/23 0745  BP: 132/76 116/69 (!) 113/59 120/66  Pulse: 61 62 (!) 55 (!) 58  Resp: 19 16 18 16   Temp: 97.9 F (36.6 C) 98 F (36.7 C) 97.8 F (36.6 C) (!) 97.4 F (36.3 C)  TempSrc: Oral Oral Oral Oral  SpO2: 98%  97% 97%  Weight:      Height:        Intake/Output Summary (Last 24 hours) at 08/15/2023 0829 Last data filed at 08/14/2023 2000 Gross per 24 hour  Intake 240 ml  Output 550 ml  Net -310 ml      08/10/2023    5:00 PM 07/10/2023    1:59 PM 05/22/2023    4:21 PM  Last 3 Weights  Weight (lbs) 110 lb 10.7 oz 109 lb 6.4 oz 107 lb  Weight (kg) 50.2 kg 49.624 kg 48.535 kg     Telemetry    Atrial sensed, V  paced - Personally Reviewed  ECG    pending - Personally Reviewed  Physical Exam   GEN: No acute distress.  HEENT: Normocephalic, atraumatic, sclera non-icteric. Neck: No JVD or bruits. Cardiac: RRR no murmurs, rubs, or gallops.  Respiratory: Clear to auscultation bilaterally. Breathing is unlabored. GI: Soft, nontender, non-distended, BS +x 4. MS: no deformity. Extremities: No clubbing or cyanosis. No edema. Distal pedal pulses are 2+ and equal bilaterally. Small knot at right radial forearm cath site but no ecchymosis or bruit, good pulse Neuro:  AAOx3. Follows commands. Psych:  Responds to questions appropriately with a normal (albeit blunted) affect.  Labs    High Sensitivity Troponin:  No results for input(s): TROPONINIHS in the last 720 hours.    Cardiac EnzymesNo results for input(s): TROPONINI in the last 168 hours. No results for input(s): TROPIPOC in the last 168 hours.   Chemistry Recent Labs  Lab 08/10/23 2236 08/11/23 0445 08/13/23 0532 08/14/23 0452 08/15/23 0407  NA 138   < > 139 136 137  K 3.7   < > 3.9 3.8 4.1  CL 109   < > 108 103 102  CO2 20*   < > 24  25 27  GLUCOSE 141*   < > 85 88 84  BUN 12   < > 23* 13 11  CREATININE 0.85   < > 0.95 0.97 0.95  CALCIUM  8.9   < > 8.5* 8.6* 9.1  PROT 6.1*  --   --   --   --   ALBUMIN  3.0*  --   --   --   --   AST 25  --   --   --   --   ALT 13  --   --   --   --   ALKPHOS 71  --   --   --   --   BILITOT 1.1  --   --   --   --   GFRNONAA >60   < > >60 >60 >60  ANIONGAP 9   < > 7 8 8    < > = values in this interval not displayed.     Hematology Recent Labs  Lab 08/13/23 0532 08/14/23 0452 08/15/23 0407  WBC 7.9 6.9 7.1  RBC 4.37 4.57 4.99  HGB 12.7 13.2 14.5  HCT 38.6 40.1 43.4  MCV 88.3 87.7 87.0  MCH 29.1 28.9 29.1  MCHC 32.9 32.9 33.4  RDW 14.5 13.9 14.0  PLT 144* 165 169    BNPNo results for input(s): BNP, PROBNP in the last 168 hours.   DDimer No results for input(s): DDIMER in  the last 168 hours.   Radiology    VAS US  DOPPLER PRE CABG Result Date: 08/13/2023 PREOPERATIVE VASCULAR EVALUATION Patient Name:  ANGELISSE RISO  Date of Exam:   08/13/2023 Medical Rec #: [id number]      Accession #:    [id number] Date of Birth: Sep 07, 1965      Patient Gender: F Patient Age:   52 years Exam Location:  [facility] Procedure:      VAS US  DOPPLER PRE CABG Referring Phys: ELSPETH HENDRICKSON --------------------------------------------------------------------------------  Indications:  Pre-CABG. Risk Factors: Hypertension, hyperlipidemia, current smoker, prior MI, coronary               artery disease, prior CVA. Performing Technologist: Jimmye Scarce RVT  Examination Guidelines: A complete evaluation includes B-mode imaging, spectral Doppler, color Doppler, and power Doppler as needed of all accessible portions of each vessel. Bilateral testing is considered an integral part of a complete examination. Limited examinations for reoccurring indications may be performed as noted.  Right Carotid Findings: +----------+--------+--------+--------+--------------------------+--------+           PSV cm/sEDV cm/sStenosisDescribe                  Comments +----------+--------+--------+--------+--------------------------+--------+ CCA Prox  308     27                                                 +----------+--------+--------+--------+--------------------------+--------+ CCA Distal53      12              smooth and heterogenous            +----------+--------+--------+--------+--------------------------+--------+ ICA Prox  46      17      1-39%   irregular and heterogenous         +----------+--------+--------+--------+--------------------------+--------+ ICA Mid   65      28                                                 +----------+--------+--------+--------+--------------------------+--------+  ICA Distal66      18                                                  +----------+--------+--------+--------+--------------------------+--------+ ECA       93                                                         +----------+--------+--------+--------+--------------------------+--------+ +----------+--------+-------+----------------+------------+           PSV cm/sEDV cmsDescribe        Arm Pressure +----------+--------+-------+----------------+------------+ Subclavian188            Multiphasic, WNL130          +----------+--------+-------+----------------+------------+ +---------+--------+--+--------+--+---------+ VertebralPSV cm/s50EDV cm/s11Antegrade +---------+--------+--+--------+--+---------+ Irregular, heterogeneous plaque is seen with increased velocities in the innominate artery up to 380 cm/sec. Left Carotid Findings: +----------+--------+--------+--------+--------------------------+--------+           PSV cm/sEDV cm/sStenosisDescribe                  Comments +----------+--------+--------+--------+--------------------------+--------+ CCA Prox  118     18                                                 +----------+--------+--------+--------+--------------------------+--------+ CCA Distal75      17              smooth and heterogenous            +----------+--------+--------+--------+--------------------------+--------+ ICA Prox  93      19      1-39%   irregular and heterogenous         +----------+--------+--------+--------+--------------------------+--------+ ICA Mid   102     19                                                 +----------+--------+--------+--------+--------------------------+--------+ ICA Distal93      22                                                 +----------+--------+--------+--------+--------------------------+--------+ ECA       122                                                        +----------+--------+--------+--------+--------------------------+--------+  +----------+--------+--------+--------+------------+ SubclavianPSV cm/sEDV cm/sDescribeArm Pressure +----------+--------+--------+--------+------------+           232             Stenotic             +----------+--------+--------+--------+------------+ +---------+--------+--+--------+-+---------------+ VertebralPSV cm/s25EDV cm/s0Bi- directional +---------+--------+--+--------+-+---------------+ Known left subclavian art stenosis ABI Findings: +------------------+-----+---------+ Rt Pressure (mmHg)IndexWaveform  +------------------+-----+---------+ 130  biphasic  +------------------+-----+---------+ 159               1.22 triphasic +------------------+-----+---------+ 148               1.14 triphasic +------------------+-----+---------+ +------------------+-----+---------+-------+ Lt Pressure (mmHg)IndexWaveform Comment +------------------+-----+---------+-------+                                 IV      +------------------+-----+---------+-------+ 164               1.26 triphasic        +------------------+-----+---------+-------+ 158               1.22 biphasic         +------------------+-----+---------+-------+ +-------+---------------+ ABI/TBIToday's ABI/TBI +-------+---------------+ Right  1.22            +-------+---------------+ Left   1.26            +-------+---------------+  Right Doppler Findings: +--------+--------+--------+ Site    PressureDoppler  +--------+--------+--------+ Brachial130     biphasic +--------+--------+--------+  Left Doppler Findings: +--------+--------+ Site    Comments +--------+--------+ BrachialIV       +--------+--------+   Summary: Right Carotid: Velocities in the right ICA are consistent with a 1-39% stenosis.                Stenotic innominate artery. Left Carotid: Velocities in the left ICA are consistent with a 1-39% stenosis. Vertebrals:  Right vertebral artery demonstrates  antegrade flow. Left vertebral              artery demonstrates bidirectional flow. Subclavians: Left subclavian artery was stenotic. Normal flow hemodynamics were              seen in the right subclavian artery. Right ABI: Resting right ankle-brachial index is within normal range. Left ABI: Resting left ankle-brachial index is within normal range. Right Upper Extremity: Doppler waveforms decrease >50% with right radial compression. Doppler waveforms remain within normal limits with right ulnar compression. Left Upper Extremity: Doppler waveforms remain within normal limits with left radial compression. Doppler waveforms remain within normal limits with left ulnar compression.  Electronically signed by Fonda Rim on 08/13/2023 at 6:49:51 PM.    Final     Cardiac Studies   Per notes: Images personally reviewed. 08/10/23 cath [place]: 3 vessel disease involving the LAD, LCX, RCA. Normal LVEDP.  LM normal, LAD 80-90%, D1 70-80%, LCX 70-80% lesion at stent edge, OM1 60-70%, RCA prox 20-30% with diffuse haziness in the mid segment which could represent thrombus, distal RCA 99%.   Patient Profile     58 y.o. female with CAD with lateral MI in 2015 s/p PCI to LCx complicated by retroperitoneal bleeding requiring vacular surgery, chronic HFrEF, ICD, MV repair 2018, HLD, subclavian stenosis with prior stenting, HLD, stroke, carotid artery disease presented to [facility] with chest pain. While in ER had chest pain/ICD shock with interrogation notable for VF with appropriate shock.  With chest pains, elevated troponins thought ischemia driven arrhythmia. Had cath in [place] with 3 vessel disease, referred to [facility] for CT surgery evaluation.   Assessment & Plan    1. CAD, NSTEMI - history of prior stenting as listed above - admitted initially to [facility] with NSTEMI. While in ER ICD shock for VF that as appropriate and successful.  -  08/10/23 cath [facility]: 3 vessel disease  involving the LAD, LCX, RCA. Normal LVEDP.  LM normal, LAD 80-90%, D1 70-80%, LCX 70-80% lesion at stent edge, OM1 60-70%, RCA prox 20-30% with diffuse haziness in the mid segment which could represent thrombus, distal RCA 99%. Transferred to [facility] for CABG evaluation. This is pending for 08/16/23. - continue medical therapy for NSTEMI with heparin  gtt, NTG gtt, ASA 81mg  daily, carvedilol  3.125mg  BID, rosuvastatin  80mg , ezetimibe  10mg  daily, amlodipine  5mg  daily. Of note on plavix  at home, last dose 8/8 AM per notes. Lipid plan as below - echo 08/12/23 EF 55-60%, mild LVH, mild MR, s/p MV ring annuloplasty, mild-moderate TR - still with chest pain since admission unchanged per patient except does feel better with scheduled morphine  and lidocaine  patch - will repeat EKG this AM and review with MD. Addendum: EKG with a sensed V paced rhythm, diffuse STTW changes similar to prior - more pronounced ST upsloping V1-V3 than earlier this admission but also seen in March. Continue medical therapy per review with Dr. Anner; based on the fact that she has not had any changes symptoms with removal of lidocaine  patch and reduction in nitroglycerin  infusion, her pain is mostly musculoskeletal at this point.  Unfortunately this will simply be exacerbated post CABG.  2. ICD shock/VF - interrogation of device at [facility] 08/09/2023 at 1327 VF event which she appropriately defibrillated.  - in setting of NSTEMI, 3 vessel CAD - keep K >4, Mg >2 -> will give additional mag supplementation today per d/w pharmacy - will need to review driving restrictions at follow-up, will also depend on CABG recovery   3. Chronic HFimpEF - underwent [employer] CRT-D implantation in January 2019 for EF of 20 to 25%  - echo as above - on spironolactone , hold off adding ACEi/ARB/ARNI given plan for surgery, resolved LV dysfunction   4.HLD - home med list with atorvastatin  80mg  daily but no dispense records; + Nexletol  - notes indicate side  effects on pcsk9i - here, on rosuvastatin  40mg  + Zetia  10mg  daily- - follow as outpatient   5. History of subclavian stenosis and stenting - duplex with left subclavian stenosis and mild bilateral carotid disease this admission, defer bypass selection to CVTS team  6. H/o MV repair - echo as above   For questions or updates, please contact [facility] Please consult [email or web address] for contact info under Cardiology/STEMI.  Signed, Dayna N Dunn, PA-C 08/15/2023, 8:29 AM     ATTENDING ATTESTATION  I have seen, examined and evaluated the patient this along with Dayna N Dunn, PA-C.  After reviewing all the available data and chart, we discussed the patients laboratory, study & physical findings as well as symptoms in detail.  I agree with her findings, examination as well as impression recommendations as per our discussion.    Attending adjustments noted in italics.   She has been stable for the last couple days.  Still has some chest wall discomfort but not really having angina.  Just started to get more anxious and scared about upcoming surgery tomorrow.  Had lots of questions about how long she will be here post plan how much freedom which she would have.  She asked about other risks which I deferred to surgery.  She has paroxysmal stable regimen.  I suspect has been able to wean off nitroglycerin  and she would potentially tolerate more afterload reduction.  Plan is OR tomorrow.  Appreciate Dr. Chrystal note.    Alm MICAEL Anner, MD, MS Alm Anner, M.D., M.S. Interventional Chartered certified accountant  Pager # [phone]

## 2023-08-15 NOTE — Plan of Care (Signed)
  Problem: Education: Goal: Knowledge of General Education information will improve Description: Including pain rating scale, medication(s)/side effects and non-pharmacologic comfort measures Outcome: Progressing   Problem: Health Behavior/Discharge Planning: Goal: Ability to manage health-related needs will improve Outcome: Progressing   Problem: Clinical Measurements: Goal: Ability to maintain clinical measurements within normal limits will improve Outcome: Progressing Goal: Will remain free from infection Outcome: Progressing Goal: Diagnostic test results will improve Outcome: Progressing Goal: Respiratory complications will improve Outcome: Progressing Goal: Cardiovascular complication will be avoided Outcome: Progressing   Problem: Activity: Goal: Risk for activity intolerance will decrease Outcome: Progressing   Problem: Nutrition: Goal: Adequate nutrition will be maintained Outcome: Progressing   Problem: Coping: Goal: Level of anxiety will decrease Outcome: Progressing   Problem: Pain Managment: Goal: General experience of comfort will improve and/or be controlled Outcome: Progressing   Problem: Safety: Goal: Ability to remain free from injury will improve Outcome: Progressing   Problem: Skin Integrity: Goal: Risk for impaired skin integrity will decrease Outcome: Progressing   Problem: Education: Goal: Understanding of cardiac disease, CV risk reduction, and recovery process will improve Outcome: Progressing   Problem: Activity: Goal: Ability to tolerate increased activity will improve Outcome: Progressing   Problem: Cardiac: Goal: Ability to achieve and maintain adequate cardiovascular perfusion will improve Outcome: Progressing

## 2023-08-16 ENCOUNTER — Inpatient Hospital Stay (HOSPITAL_COMMUNITY): Admitting: Certified Registered Nurse Anesthetist

## 2023-08-16 ENCOUNTER — Inpatient Hospital Stay (HOSPITAL_COMMUNITY)

## 2023-08-16 ENCOUNTER — Encounter (HOSPITAL_COMMUNITY): Payer: Self-pay | Admitting: Cardiology

## 2023-08-16 ENCOUNTER — Inpatient Hospital Stay (HOSPITAL_COMMUNITY)
Admission: EM | Disposition: A | Discharge status: Home Health | Payer: Self-pay | Source: Other Acute Inpatient Hospital | Attending: Thoracic Surgery (Cardiothoracic Vascular Surgery)

## 2023-08-16 ENCOUNTER — Other Ambulatory Visit: Payer: Self-pay

## 2023-08-16 DIAGNOSIS — I2510 Atherosclerotic heart disease of native coronary artery without angina pectoris: Secondary | ICD-10-CM

## 2023-08-16 DIAGNOSIS — I5023 Acute on chronic systolic (congestive) heart failure: Secondary | ICD-10-CM | POA: Diagnosis not present

## 2023-08-16 DIAGNOSIS — Z951 Presence of aortocoronary bypass graft: Secondary | ICD-10-CM

## 2023-08-16 DIAGNOSIS — I252 Old myocardial infarction: Secondary | ICD-10-CM | POA: Diagnosis not present

## 2023-08-16 DIAGNOSIS — I110 Hypertensive heart disease with heart failure: Secondary | ICD-10-CM | POA: Diagnosis not present

## 2023-08-16 HISTORY — PX: TEE WITHOUT CARDIOVERSION: SHX5443

## 2023-08-16 HISTORY — PX: CORONARY ARTERY BYPASS GRAFT: SHX141

## 2023-08-16 LAB — POCT I-STAT 7, (LYTES, BLD GAS, ICA,H+H)
Acid-Base Excess: 1 mmol/L (ref 0.0–2.0)
Acid-Base Excess: 19 mmol/L — ABNORMAL HIGH (ref 0.0–2.0)
Acid-base deficit: 5 mmol/L — ABNORMAL HIGH (ref 0.0–2.0)
Acid-base deficit: 1 mmol/L (ref 0.0–2.0)
Acid-base deficit: 2 mmol/L (ref 0.0–2.0)
Acid-base deficit: 1 mmol/L (ref 0.0–2.0)
Acid-base deficit: 2 mmol/L (ref 0.0–2.0)
Bicarbonate: 25.1 mmol/L (ref 20.0–28.0)
Bicarbonate: 18.3 mmol/L — ABNORMAL LOW (ref 20.0–28.0)
Bicarbonate: 41.4 mmol/L — ABNORMAL HIGH (ref 20.0–28.0)
Bicarbonate: 22.3 mmol/L (ref 20.0–28.0)
Bicarbonate: 23.5 mmol/L (ref 20.0–28.0)
Bicarbonate: 25.2 mmol/L (ref 20.0–28.0)
Bicarbonate: 25.1 mmol/L (ref 20.0–28.0)
Calcium, Ion: 1.21 mmol/L (ref 1.15–1.40)
Calcium, Ion: 0.74 mmol/L — CL (ref 1.15–1.40)
Calcium, Ion: 0.81 mmol/L — CL (ref 1.15–1.40)
Calcium, Ion: 1.25 mmol/L (ref 1.15–1.40)
Calcium, Ion: 1.09 mmol/L — ABNORMAL LOW (ref 1.15–1.40)
Calcium, Ion: 1.28 mmol/L (ref 1.15–1.40)
Calcium, Ion: 1.29 mmol/L (ref 1.15–1.40)
HCT: 40 % (ref 36.0–46.0)
HCT: 20 % — ABNORMAL LOW (ref 36.0–46.0)
HCT: 20 % — ABNORMAL LOW (ref 36.0–46.0)
HCT: 27 % — ABNORMAL LOW (ref 36.0–46.0)
HCT: 28 % — ABNORMAL LOW (ref 36.0–46.0)
HCT: 20 % — ABNORMAL LOW (ref 36.0–46.0)
HCT: 22 % — ABNORMAL LOW (ref 36.0–46.0)
Hemoglobin: 13.6 g/dL (ref 12.0–15.0)
Hemoglobin: 6.8 g/dL — CL (ref 12.0–15.0)
Hemoglobin: 6.8 g/dL — CL (ref 12.0–15.0)
Hemoglobin: 9.2 g/dL — ABNORMAL LOW (ref 12.0–15.0)
Hemoglobin: 9.5 g/dL — ABNORMAL LOW (ref 12.0–15.0)
Hemoglobin: 6.8 g/dL — CL (ref 12.0–15.0)
Hemoglobin: 7.5 g/dL — ABNORMAL LOW (ref 12.0–15.0)
O2 Saturation: 100 %
O2 Saturation: 100 %
O2 Saturation: 100 %
O2 Saturation: 100 %
O2 Saturation: 98 %
O2 Saturation: 99 %
O2 Saturation: 92 %
Patient temperature: 36
Patient temperature: 36.5
Patient temperature: 36.4
Potassium: 4 mmol/L (ref 3.5–5.1)
Potassium: 3.2 mmol/L — ABNORMAL LOW (ref 3.5–5.1)
Potassium: 3.7 mmol/L (ref 3.5–5.1)
Potassium: 5.3 mmol/L — ABNORMAL HIGH (ref 3.5–5.1)
Potassium: 4.3 mmol/L (ref 3.5–5.1)
Potassium: 4 mmol/L (ref 3.5–5.1)
Potassium: 4 mmol/L (ref 3.5–5.1)
Sodium: 137 mmol/L (ref 135–145)
Sodium: 137 mmol/L (ref 135–145)
Sodium: 149 mmol/L — ABNORMAL HIGH (ref 135–145)
Sodium: 132 mmol/L — ABNORMAL LOW (ref 135–145)
Sodium: 138 mmol/L (ref 135–145)
Sodium: 140 mmol/L (ref 135–145)
Sodium: 140 mmol/L (ref 135–145)
TCO2: 26 mmol/L (ref 22–32)
TCO2: 19 mmol/L — ABNORMAL LOW (ref 22–32)
TCO2: 43 mmol/L — ABNORMAL HIGH (ref 22–32)
TCO2: 23 mmol/L (ref 22–32)
TCO2: 25 mmol/L (ref 22–32)
TCO2: 27 mmol/L (ref 22–32)
TCO2: 27 mmol/L (ref 22–32)
pCO2 arterial: 39.1 mmHg (ref 32–48)
pCO2 arterial: 24.4 mmHg — ABNORMAL LOW (ref 32–48)
pCO2 arterial: 39 mmHg (ref 32–48)
pCO2 arterial: 31.7 mmHg — ABNORMAL LOW (ref 32–48)
pCO2 arterial: 42.4 mmHg (ref 32–48)
pCO2 arterial: 45.5 mmHg (ref 32–48)
pCO2 arterial: 55.4 mmHg — ABNORMAL HIGH (ref 32–48)
pH, Arterial: 7.415 (ref 7.35–7.45)
pH, Arterial: 7.482 — ABNORMAL HIGH (ref 7.35–7.45)
pH, Arterial: 7.634 (ref 7.35–7.45)
pH, Arterial: 7.455 — ABNORMAL HIGH (ref 7.35–7.45)
pH, Arterial: 7.346 — ABNORMAL LOW (ref 7.35–7.45)
pH, Arterial: 7.349 — ABNORMAL LOW (ref 7.35–7.45)
pH, Arterial: 7.261 — ABNORMAL LOW (ref 7.35–7.45)
pO2, Arterial: 584 mmHg — ABNORMAL HIGH (ref 83–108)
pO2, Arterial: 473 mmHg — ABNORMAL HIGH (ref 83–108)
pO2, Arterial: 507 mmHg — ABNORMAL HIGH (ref 83–108)
pO2, Arterial: 302 mmHg — ABNORMAL HIGH (ref 83–108)
pO2, Arterial: 114 mmHg — ABNORMAL HIGH (ref 83–108)
pO2, Arterial: 130 mmHg — ABNORMAL HIGH (ref 83–108)
pO2, Arterial: 72 mmHg — ABNORMAL LOW (ref 83–108)

## 2023-08-16 LAB — POCT I-STAT, CHEM 8
BUN: 15 mg/dL (ref 6–20)
BUN: 13 mg/dL (ref 6–20)
BUN: 10 mg/dL (ref 6–20)
BUN: 10 mg/dL (ref 6–20)
BUN: 9 mg/dL (ref 6–20)
BUN: 11 mg/dL (ref 6–20)
Calcium, Ion: 1.21 mmol/L (ref 1.15–1.40)
Calcium, Ion: 1.18 mmol/L (ref 1.15–1.40)
Calcium, Ion: 0.8 mmol/L — CL (ref 1.15–1.40)
Calcium, Ion: 0.69 mmol/L — CL (ref 1.15–1.40)
Calcium, Ion: 0.74 mmol/L — CL (ref 1.15–1.40)
Calcium, Ion: 1.25 mmol/L (ref 1.15–1.40)
Chloride: 100 mmol/L (ref 98–111)
Chloride: 98 mmol/L (ref 98–111)
Chloride: 97 mmol/L — ABNORMAL LOW (ref 98–111)
Chloride: 97 mmol/L — ABNORMAL LOW (ref 98–111)
Chloride: 97 mmol/L — ABNORMAL LOW (ref 98–111)
Chloride: 98 mmol/L (ref 98–111)
Creatinine, Ser: 0.9 mg/dL (ref 0.44–1.00)
Creatinine, Ser: 0.8 mg/dL (ref 0.44–1.00)
Creatinine, Ser: 0.6 mg/dL (ref 0.44–1.00)
Creatinine, Ser: 0.6 mg/dL (ref 0.44–1.00)
Creatinine, Ser: 0.6 mg/dL (ref 0.44–1.00)
Creatinine, Ser: 0.7 mg/dL (ref 0.44–1.00)
Glucose, Bld: 117 mg/dL — ABNORMAL HIGH (ref 70–99)
Glucose, Bld: 190 mg/dL — ABNORMAL HIGH (ref 70–99)
Glucose, Bld: 76 mg/dL (ref 70–99)
Glucose, Bld: 101 mg/dL — ABNORMAL HIGH (ref 70–99)
Glucose, Bld: 99 mg/dL (ref 70–99)
Glucose, Bld: 155 mg/dL — ABNORMAL HIGH (ref 70–99)
HCT: 42 % (ref 36.0–46.0)
HCT: 35 % — ABNORMAL LOW (ref 36.0–46.0)
HCT: 22 % — ABNORMAL LOW (ref 36.0–46.0)
HCT: 21 % — ABNORMAL LOW (ref 36.0–46.0)
HCT: 25 % — ABNORMAL LOW (ref 36.0–46.0)
HCT: 26 % — ABNORMAL LOW (ref 36.0–46.0)
Hemoglobin: 14.3 g/dL (ref 12.0–15.0)
Hemoglobin: 11.9 g/dL — ABNORMAL LOW (ref 12.0–15.0)
Hemoglobin: 7.5 g/dL — ABNORMAL LOW (ref 12.0–15.0)
Hemoglobin: 7.1 g/dL — ABNORMAL LOW (ref 12.0–15.0)
Hemoglobin: 8.5 g/dL — ABNORMAL LOW (ref 12.0–15.0)
Hemoglobin: 8.8 g/dL — ABNORMAL LOW (ref 12.0–15.0)
Potassium: 4.1 mmol/L (ref 3.5–5.1)
Potassium: 3.6 mmol/L (ref 3.5–5.1)
Potassium: 3.8 mmol/L (ref 3.5–5.1)
Potassium: 5.4 mmol/L — ABNORMAL HIGH (ref 3.5–5.1)
Potassium: 6.1 mmol/L — ABNORMAL HIGH (ref 3.5–5.1)
Potassium: 5.3 mmol/L — ABNORMAL HIGH (ref 3.5–5.1)
Sodium: 137 mmol/L (ref 135–145)
Sodium: 135 mmol/L (ref 135–145)
Sodium: 137 mmol/L (ref 135–145)
Sodium: 136 mmol/L (ref 135–145)
Sodium: 138 mmol/L (ref 135–145)
Sodium: 131 mmol/L — ABNORMAL LOW (ref 135–145)
TCO2: 29 mmol/L (ref 22–32)
TCO2: 25 mmol/L (ref 22–32)
TCO2: 21 mmol/L — ABNORMAL LOW (ref 22–32)
TCO2: 26 mmol/L (ref 22–32)
TCO2: 29 mmol/L (ref 22–32)
TCO2: 24 mmol/L (ref 22–32)

## 2023-08-16 LAB — BASIC METABOLIC PANEL WITH GFR
Anion gap: 6 (ref 5–15)
Anion gap: 10 (ref 5–15)
BUN: 14 mg/dL (ref 6–20)
BUN: 11 mg/dL (ref 6–20)
CO2: 30 mmol/L (ref 22–32)
CO2: 24 mmol/L (ref 22–32)
Calcium: 9.4 mg/dL (ref 8.9–10.3)
Calcium: 8.7 mg/dL — ABNORMAL LOW (ref 8.9–10.3)
Chloride: 100 mmol/L (ref 98–111)
Chloride: 104 mmol/L (ref 98–111)
Creatinine, Ser: 0.97 mg/dL (ref 0.44–1.00)
Creatinine, Ser: 0.96 mg/dL (ref 0.44–1.00)
GFR, Estimated: >60 mL/min (ref >60)
GFR, Estimated: >60 mL/min (ref >60)
Glucose, Bld: 83 mg/dL (ref 70–99)
Glucose, Bld: 130 mg/dL — ABNORMAL HIGH (ref 70–99)
Potassium: 4.4 mmol/L (ref 3.5–5.1)
Potassium: 4.2 mmol/L (ref 3.5–5.1)
Sodium: 136 mmol/L (ref 135–145)
Sodium: 138 mmol/L (ref 135–145)

## 2023-08-16 LAB — PROTIME-INR
INR: 1 (ref 0.8–1.2)
INR: 1.9 — ABNORMAL HIGH (ref 0.8–1.2)
Prothrombin Time: 14.2 s (ref 11.4–15.2)
Prothrombin Time: 22.6 s — ABNORMAL HIGH (ref 11.4–15.2)

## 2023-08-16 LAB — COOXEMETRY PANEL
Carboxyhemoglobin: 2.3 % — ABNORMAL HIGH (ref 0.5–1.5)
Methemoglobin: <0.7 % (ref 0.0–1.5)
O2 Saturation: 65.2 %
Total hemoglobin: 10.2 g/dL — ABNORMAL LOW (ref 12.0–16.0)

## 2023-08-16 LAB — HEMOGLOBIN A1C
Hgb A1c MFr Bld: 5.1 % (ref 4.8–5.6)
Mean Plasma Glucose: 99.67 mg/dL

## 2023-08-16 LAB — PREPARE RBC (CROSSMATCH)

## 2023-08-16 LAB — CBC
HCT: 49.1 % — ABNORMAL HIGH (ref 36.0–46.0)
HCT: 33.2 % — ABNORMAL LOW (ref 36.0–46.0)
HCT: 23.2 % — ABNORMAL LOW (ref 36.0–46.0)
Hemoglobin: 16.2 g/dL — ABNORMAL HIGH (ref 12.0–15.0)
Hemoglobin: 11.3 g/dL — ABNORMAL LOW (ref 12.0–15.0)
Hemoglobin: 7.9 g/dL — ABNORMAL LOW (ref 12.0–15.0)
MCH: 29.2 pg (ref 26.0–34.0)
MCH: 30.1 pg (ref 26.0–34.0)
MCH: 30.3 pg (ref 26.0–34.0)
MCHC: 33 g/dL (ref 30.0–36.0)
MCHC: 34 g/dL (ref 30.0–36.0)
MCHC: 34.1 g/dL (ref 30.0–36.0)
MCV: 88.6 fL (ref 80.0–100.0)
MCV: 88.5 fL (ref 80.0–100.0)
MCV: 88.9 fL (ref 80.0–100.0)
Platelets: 191 K/uL (ref 150–400)
Platelets: 114 K/uL — ABNORMAL LOW (ref 150–400)
Platelets: 55 K/uL — ABNORMAL LOW (ref 150–400)
RBC: 5.54 MIL/uL — ABNORMAL HIGH (ref 3.87–5.11)
RBC: 3.75 MIL/uL — ABNORMAL LOW (ref 3.87–5.11)
RBC: 2.61 MIL/uL — ABNORMAL LOW (ref 3.87–5.11)
RDW: 14.3 % (ref 11.5–15.5)
RDW: 13.8 % (ref 11.5–15.5)
RDW: 13.9 % (ref 11.5–15.5)
WBC: 7.4 K/uL (ref 4.0–10.5)
WBC: 10.3 K/uL (ref 4.0–10.5)
WBC: 6.2 K/uL (ref 4.0–10.5)
nRBC: 0 % (ref 0.0–0.2)
nRBC: 0 % (ref 0.0–0.2)
nRBC: 0 % (ref 0.0–0.2)

## 2023-08-16 LAB — MAGNESIUM
Magnesium: 1.7 mg/dL (ref 1.7–2.4)
Magnesium: 3.4 mg/dL — ABNORMAL HIGH (ref 1.7–2.4)

## 2023-08-16 LAB — HEMOGLOBIN AND HEMATOCRIT, BLOOD
HCT: 25.2 % — ABNORMAL LOW (ref 36.0–46.0)
Hemoglobin: 8.6 g/dL — ABNORMAL LOW (ref 12.0–15.0)

## 2023-08-16 LAB — GLUCOSE, CAPILLARY
Glucose-Capillary: 120 mg/dL — ABNORMAL HIGH (ref 70–99)
Glucose-Capillary: 139 mg/dL — ABNORMAL HIGH (ref 70–99)
Glucose-Capillary: 133 mg/dL — ABNORMAL HIGH (ref 70–99)
Glucose-Capillary: 133 mg/dL — ABNORMAL HIGH (ref 70–99)
Glucose-Capillary: 138 mg/dL — ABNORMAL HIGH (ref 70–99)
Glucose-Capillary: 162 mg/dL — ABNORMAL HIGH (ref 70–99)
Glucose-Capillary: 167 mg/dL — ABNORMAL HIGH (ref 70–99)

## 2023-08-16 LAB — APTT
aPTT: 80 s — ABNORMAL HIGH (ref 24–36)
aPTT: 51 s — ABNORMAL HIGH (ref 24–36)

## 2023-08-16 LAB — PLATELET COUNT: Platelets: 68 K/uL — ABNORMAL LOW (ref 150–400)

## 2023-08-16 LAB — HEPARIN LEVEL (UNFRACTIONATED): Heparin Unfractionated: 0.48 [IU]/mL (ref 0.30–0.70)

## 2023-08-16 SURGERY — CORONARY ARTERY BYPASS GRAFTING (CABG)
Anesthesia: General | Site: Chest

## 2023-08-16 MED ORDER — LACTATED RINGERS IV SOLN: INTRAVENOUS | Status: AC

## 2023-08-16 MED ORDER — LACTATED RINGERS IV SOLN: INTRAVENOUS | Status: DC | PRN

## 2023-08-16 MED ORDER — OXYCODONE HCL 5 MG PO TABS
5.0000 mg | ORAL_TABLET | ORAL | Status: DC | PRN
  Administered 2023-08-16 – 2023-08-17 (×5): 10 mg via ORAL
  Administered 2023-08-18: 5 mg via ORAL
  Administered 2023-08-18 – 2023-08-19 (×2): 10 mg via ORAL
  Filled 2023-08-16 (×7): qty 2
  Filled 2023-08-16: qty 1

## 2023-08-16 MED ORDER — ORAL CARE MOUTH RINSE: 15.0000 mL | OROMUCOSAL | Status: DC | PRN

## 2023-08-16 MED ORDER — ACETAMINOPHEN 160 MG/5ML PO SOLN
650.0000 mg | Freq: Once | ORAL | Status: AC
  Administered 2023-08-16: 650 mg
  Filled 2023-08-16: qty 20.3

## 2023-08-16 MED ORDER — ALBUMIN HUMAN 5 % IV SOLN
250.0000 mL | INTRAVENOUS | Status: AC | PRN
  Administered 2023-08-16 (×5): 12.5 g via INTRAVENOUS
  Filled 2023-08-16 (×2): qty 250

## 2023-08-16 MED ORDER — HEPARIN SODIUM (PORCINE) 1000 UNIT/ML IJ SOLN
INTRAMUSCULAR | Status: DC | PRN
  Administered 2023-08-16: 16000 [IU] via INTRAVENOUS
  Administered 2023-08-16: 2000 [IU] via INTRAVENOUS

## 2023-08-16 MED ORDER — ROCURONIUM BROMIDE 10 MG/ML (PF) SYRINGE
PREFILLED_SYRINGE | INTRAVENOUS | Status: DC | PRN
  Administered 2023-08-16 (×2): 50 mg via INTRAVENOUS
  Administered 2023-08-16 (×2): 30 mg via INTRAVENOUS

## 2023-08-16 MED ORDER — MILRINONE LACTATE IN DEXTROSE 20-5 MG/100ML-% IV SOLN
0.1250 ug/kg/min | INTRAVENOUS | Status: DC
  Administered 2023-08-16 – 2023-08-17 (×2): 0.25 ug/kg/min via INTRAVENOUS
  Filled 2023-08-16: qty 100

## 2023-08-16 MED ORDER — CALCIUM CHLORIDE 10 % IV SOLN
INTRAVENOUS | Status: DC | PRN
  Administered 2023-08-16: 300 mg via INTRAVENOUS
  Administered 2023-08-16: 200 mg via INTRAVENOUS

## 2023-08-16 MED ORDER — FENTANYL CITRATE (PF) 250 MCG/5ML IJ SOLN
INTRAMUSCULAR | Status: AC
  Filled 2023-08-16: qty 5

## 2023-08-16 MED ORDER — DOCUSATE SODIUM 100 MG PO CAPS
200.0000 mg | ORAL_CAPSULE | Freq: Every day | ORAL | Status: DC
  Administered 2023-08-17 – 2023-08-21 (×4): 200 mg via ORAL
  Filled 2023-08-16 (×6): qty 2

## 2023-08-16 MED ORDER — SODIUM CHLORIDE 0.9 % IV SOLN: INTRAVENOUS | Status: AC

## 2023-08-16 MED ORDER — 0.9 % SODIUM CHLORIDE (POUR BTL) OPTIME
TOPICAL | Status: DC | PRN
  Administered 2023-08-16: 5000 mL

## 2023-08-16 MED ORDER — METOPROLOL TARTRATE 12.5 MG HALF TABLET: 12.5000 mg | ORAL_TABLET | Freq: Two times a day (BID) | ORAL | Status: DC

## 2023-08-16 MED ORDER — MAGNESIUM SULFATE 4 GM/100ML IV SOLN
4.0000 g | Freq: Once | INTRAVENOUS | Status: AC
  Administered 2023-08-16: 4 g via INTRAVENOUS
  Filled 2023-08-16: qty 100

## 2023-08-16 MED ORDER — TRAMADOL HCL 50 MG PO TABS
50.0000 mg | ORAL_TABLET | ORAL | Status: DC | PRN
  Administered 2023-08-18: 100 mg via ORAL
  Administered 2023-08-20 – 2023-08-21 (×2): 50 mg via ORAL
  Filled 2023-08-16: qty 1
  Filled 2023-08-16 (×2): qty 2
  Filled 2023-08-16: qty 1

## 2023-08-16 MED ORDER — MORPHINE SULFATE (PF) 2 MG/ML IV SOLN
1.0000 mg | INTRAVENOUS | Status: DC | PRN
  Administered 2023-08-16: 2 mg via INTRAVENOUS
  Administered 2023-08-16: 4 mg via INTRAVENOUS
  Administered 2023-08-16: 2 mg via INTRAVENOUS
  Administered 2023-08-17 (×3): 4 mg via INTRAVENOUS
  Filled 2023-08-16 (×5): qty 2

## 2023-08-16 MED ORDER — MIDAZOLAM HCL 5 MG/5ML IJ SOLN
INTRAMUSCULAR | Status: DC | PRN
  Administered 2023-08-16: 2 mg via INTRAVENOUS
  Administered 2023-08-16: 1 mg via INTRAVENOUS

## 2023-08-16 MED ORDER — EPHEDRINE 5 MG/ML INJ
INTRAVENOUS | Status: AC
  Filled 2023-08-16: qty 5

## 2023-08-16 MED ORDER — LIDOCAINE 2% (20 MG/ML) 5 ML SYRINGE
INTRAMUSCULAR | Status: AC
  Filled 2023-08-16: qty 5

## 2023-08-16 MED ORDER — SODIUM CHLORIDE 0.9% IV SOLUTION: Freq: Once | INTRAVENOUS | Status: DC

## 2023-08-16 MED ORDER — VASOPRESSIN 20 UNIT/ML IV SOLN
INTRAVENOUS | Status: AC
  Filled 2023-08-16: qty 1

## 2023-08-16 MED ORDER — CHLORHEXIDINE GLUCONATE 0.12 % MT SOLN
15.0000 mL | OROMUCOSAL | Status: AC
  Administered 2023-08-16: 15 mL via OROMUCOSAL
  Filled 2023-08-16: qty 15

## 2023-08-16 MED ORDER — PROPOFOL 10 MG/ML IV BOLUS
INTRAVENOUS | Status: AC
Start: 2023-08-16 — End: 2023-08-16
  Filled 2023-08-16: qty 20

## 2023-08-16 MED ORDER — FENTANYL CITRATE (PF) 100 MCG/2ML IJ SOLN
INTRAMUSCULAR | Status: DC | PRN
  Administered 2023-08-16 (×2): 100 ug via INTRAVENOUS
  Administered 2023-08-16: 50 ug via INTRAVENOUS
  Administered 2023-08-16 (×4): 100 ug via INTRAVENOUS
  Administered 2023-08-16: 200 ug via INTRAVENOUS
  Administered 2023-08-16 (×2): 150 ug via INTRAVENOUS

## 2023-08-16 MED ORDER — ACETAMINOPHEN 160 MG/5ML PO SOLN
1000.0000 mg | Freq: Four times a day (QID) | ORAL | Status: DC
  Administered 2023-08-17: 1000 mg
  Filled 2023-08-16: qty 40.6

## 2023-08-16 MED ORDER — ASPIRIN 81 MG PO CHEW
324.0000 mg | CHEWABLE_TABLET | Freq: Every day | ORAL | Status: DC
  Filled 2023-08-16: qty 4

## 2023-08-16 MED ORDER — CALCIUM CHLORIDE 10 % IV SOLN
1.0000 g | Freq: Once | INTRAVENOUS | Status: AC
  Administered 2023-08-16: 1 g via INTRAVENOUS

## 2023-08-16 MED ORDER — METOPROLOL TARTRATE 5 MG/5ML IV SOLN: 2.5000 mg | INTRAVENOUS | Status: DC | PRN

## 2023-08-16 MED ORDER — SODIUM CHLORIDE 0.9% IV SOLUTION: Freq: Once | INTRAVENOUS | Status: AC

## 2023-08-16 MED ORDER — VANCOMYCIN HCL IN DEXTROSE 1-5 GM/200ML-% IV SOLN
1000.0000 mg | Freq: Once | INTRAVENOUS | Status: AC
  Administered 2023-08-16: 1000 mg via INTRAVENOUS
  Filled 2023-08-16: qty 200

## 2023-08-16 MED ORDER — LIDOCAINE 2% (20 MG/ML) 5 ML SYRINGE
INTRAMUSCULAR | Status: DC | PRN
  Administered 2023-08-16: 60 mg via INTRAVENOUS

## 2023-08-16 MED ORDER — DEXTROSE 50 % IV SOLN: 0.0000 mL | INTRAVENOUS | Status: DC | PRN

## 2023-08-16 MED ORDER — NITROGLYCERIN IN D5W 200-5 MCG/ML-% IV SOLN: 0.0000 ug/min | INTRAVENOUS | Status: DC

## 2023-08-16 MED ORDER — MIDAZOLAM HCL (PF) 10 MG/2ML IJ SOLN
INTRAMUSCULAR | Status: AC
  Filled 2023-08-16: qty 2

## 2023-08-16 MED ORDER — PAPAVERINE HCL 30 MG/ML IJ SOLN: INTRAMUSCULAR | Status: DC | PRN

## 2023-08-16 MED ORDER — ONDANSETRON HCL 4 MG/2ML IJ SOLN
INTRAMUSCULAR | Status: AC
  Filled 2023-08-16: qty 2

## 2023-08-16 MED ORDER — ROCURONIUM BROMIDE 10 MG/ML (PF) SYRINGE
PREFILLED_SYRINGE | INTRAVENOUS | Status: AC
  Filled 2023-08-16: qty 20

## 2023-08-16 MED ORDER — METOPROLOL TARTRATE 25 MG/10 ML ORAL SUSPENSION: 12.5000 mg | Freq: Two times a day (BID) | ORAL | Status: DC

## 2023-08-16 MED ORDER — HEMOSTATIC AGENTS (NO CHARGE) OPTIME
TOPICAL | Status: DC | PRN
  Administered 2023-08-16: 1 via TOPICAL

## 2023-08-16 MED ORDER — ASPIRIN 81 MG PO CHEW
324.0000 mg | CHEWABLE_TABLET | Freq: Once | ORAL | Status: AC
  Administered 2023-08-16: 324 mg via ORAL
  Filled 2023-08-16: qty 4

## 2023-08-16 MED ORDER — SODIUM CHLORIDE (PF) 0.9 % IJ SOLN: OROMUCOSAL | Status: DC | PRN

## 2023-08-16 MED ORDER — CEFAZOLIN SODIUM-DEXTROSE 2-4 GM/100ML-% IV SOLN
2.0000 g | Freq: Three times a day (TID) | INTRAVENOUS | Status: AC
  Administered 2023-08-16 – 2023-08-18 (×6): 2 g via INTRAVENOUS
  Filled 2023-08-16 (×6): qty 100

## 2023-08-16 MED ORDER — ONDANSETRON HCL 4 MG/2ML IJ SOLN
4.0000 mg | Freq: Four times a day (QID) | INTRAMUSCULAR | Status: DC | PRN
  Administered 2023-08-18 – 2023-08-21 (×5): 4 mg via INTRAVENOUS
  Filled 2023-08-16 (×5): qty 2

## 2023-08-16 MED ORDER — PHENYLEPHRINE 80 MCG/ML (10ML) SYRINGE FOR IV PUSH (FOR BLOOD PRESSURE SUPPORT)
PREFILLED_SYRINGE | INTRAVENOUS | Status: AC
  Filled 2023-08-16: qty 10

## 2023-08-16 MED ORDER — POTASSIUM CHLORIDE 10 MEQ/50ML IV SOLN: 10.0000 meq | INTRAVENOUS | Status: AC

## 2023-08-16 MED ORDER — ORAL CARE MOUTH RINSE
15.0000 mL | OROMUCOSAL | Status: DC
  Administered 2023-08-16 – 2023-08-18 (×15): 15 mL via OROMUCOSAL

## 2023-08-16 MED ORDER — ASPIRIN 325 MG PO TBEC
325.0000 mg | DELAYED_RELEASE_TABLET | Freq: Every day | ORAL | Status: DC
  Administered 2023-08-17 – 2023-08-22 (×6): 325 mg via ORAL
  Filled 2023-08-16 (×6): qty 1

## 2023-08-16 MED ORDER — CHLORHEXIDINE GLUCONATE CLOTH 2 % EX PADS
6.0000 | MEDICATED_PAD | Freq: Every day | CUTANEOUS | Status: DC
  Administered 2023-08-16 – 2023-08-22 (×7): 6 via TOPICAL

## 2023-08-16 MED ORDER — SODIUM CHLORIDE 0.9% FLUSH
3.0000 mL | Freq: Two times a day (BID) | INTRAVENOUS | Status: DC
  Administered 2023-08-17 – 2023-08-22 (×10): 3 mL via INTRAVENOUS

## 2023-08-16 MED ORDER — MIDAZOLAM HCL 2 MG/2ML IJ SOLN: 2.0000 mg | INTRAMUSCULAR | Status: DC | PRN

## 2023-08-16 MED ORDER — PROTAMINE SULFATE 10 MG/ML IV SOLN
INTRAVENOUS | Status: DC | PRN
  Administered 2023-08-16: 50 mg via INTRAVENOUS
  Administered 2023-08-16: 20 mg via INTRAVENOUS
  Administered 2023-08-16: 160 mg via INTRAVENOUS

## 2023-08-16 MED ORDER — DEXMEDETOMIDINE HCL IN NACL 400 MCG/100ML IV SOLN: 0.0000 ug/kg/h | INTRAVENOUS | Status: DC

## 2023-08-16 MED ORDER — PANTOPRAZOLE SODIUM 40 MG PO TBEC
40.0000 mg | DELAYED_RELEASE_TABLET | Freq: Every day | ORAL | Status: DC
  Administered 2023-08-18 – 2023-08-23 (×6): 40 mg via ORAL
  Filled 2023-08-16 (×6): qty 1

## 2023-08-16 MED ORDER — EPINEPHRINE HCL 5 MG/250ML IV SOLN IN NS: 0.0000 ug/min | INTRAVENOUS | Status: DC

## 2023-08-16 MED ORDER — VASOPRESSIN 20 UNIT/ML IV SOLN
INTRAVENOUS | Status: DC | PRN
  Administered 2023-08-16: 1 [IU] via INTRAVENOUS

## 2023-08-16 MED ORDER — SODIUM CHLORIDE 0.45 % IV SOLN: INTRAVENOUS | Status: AC | PRN

## 2023-08-16 MED ORDER — SODIUM CHLORIDE 0.9 % IV SOLN: 250.0000 mL | INTRAVENOUS | Status: AC

## 2023-08-16 MED ORDER — METOCLOPRAMIDE HCL 5 MG/ML IJ SOLN
10.0000 mg | Freq: Four times a day (QID) | INTRAMUSCULAR | Status: AC
  Administered 2023-08-16 – 2023-08-17 (×5): 10 mg via INTRAVENOUS
  Filled 2023-08-16 (×5): qty 2

## 2023-08-16 MED ORDER — BISACODYL 5 MG PO TBEC
10.0000 mg | DELAYED_RELEASE_TABLET | Freq: Every day | ORAL | Status: DC
  Administered 2023-08-17 – 2023-08-19 (×3): 10 mg via ORAL
  Filled 2023-08-16 (×5): qty 2

## 2023-08-16 MED ORDER — INSULIN REGULAR(HUMAN) IN NACL 100-0.9 UT/100ML-% IV SOLN
INTRAVENOUS | Status: DC
Start: 2023-08-16 — End: 2023-08-18
  Administered 2023-08-16: 0.3 [IU]/h via INTRAVENOUS

## 2023-08-16 MED ORDER — NOREPINEPHRINE 4 MG/250ML-% IV SOLN
0.0000 ug/min | INTRAVENOUS | Status: DC
  Administered 2023-08-16: 2 ug/min via INTRAVENOUS
  Administered 2023-08-17: 4 ug/min via INTRAVENOUS
  Administered 2023-08-18: 9 ug/min via INTRAVENOUS
  Filled 2023-08-16 (×2): qty 250

## 2023-08-16 MED ORDER — EPHEDRINE SULFATE-NACL 50-0.9 MG/10ML-% IV SOSY
PREFILLED_SYRINGE | INTRAVENOUS | Status: DC | PRN
  Administered 2023-08-16 (×2): 10 mg via INTRAVENOUS

## 2023-08-16 MED ORDER — SODIUM CHLORIDE 0.9% FLUSH: 3.0000 mL | INTRAVENOUS | Status: DC | PRN

## 2023-08-16 MED ORDER — ACETAMINOPHEN 500 MG PO TABS
1000.0000 mg | ORAL_TABLET | Freq: Four times a day (QID) | ORAL | Status: DC
  Filled 2023-08-16 (×2): qty 2

## 2023-08-16 MED ORDER — PROPOFOL 10 MG/ML IV BOLUS
INTRAVENOUS | Status: DC | PRN
  Administered 2023-08-16: 50 mg via INTRAVENOUS
  Administered 2023-08-16: 20 mg via INTRAVENOUS

## 2023-08-16 MED ORDER — PANTOPRAZOLE SODIUM 40 MG IV SOLR
40.0000 mg | Freq: Every day | INTRAVENOUS | Status: AC
Start: 2023-08-16 — End: 2023-08-17
  Administered 2023-08-16 – 2023-08-17 (×2): 40 mg via INTRAVENOUS
  Filled 2023-08-16 (×2): qty 10

## 2023-08-16 MED ORDER — BISACODYL 10 MG RE SUPP: 10.0000 mg | Freq: Every day | RECTAL | Status: DC

## 2023-08-16 MED ORDER — PHENYLEPHRINE HCL-NACL 20-0.9 MG/250ML-% IV SOLN: 0.0000 ug/min | INTRAVENOUS | Status: DC

## 2023-08-16 MED ORDER — INSULIN REGULAR(HUMAN) IN NACL 100-0.9 UT/100ML-% IV SOLN
INTRAVENOUS | Status: DC | PRN
  Administered 2023-08-16: .6 [IU]/h via INTRAVENOUS

## 2023-08-16 MED FILL — Fentanyl Citrate Preservative Free (PF) Inj 100 MCG/2ML: INTRAMUSCULAR | Qty: 2 | Status: AC

## 2023-08-16 SURGICAL SUPPLY — 85 items
ADAPTER MULTI PERFUSION 15 (ADAPTER) ×3 IMPLANT
BAG DECANTER FOR FLEXI CONT (MISCELLANEOUS) ×3 IMPLANT
BLADE CLIPPER SURG (BLADE) ×3 IMPLANT
BLADE STERNUM SYSTEM 6 (BLADE) ×3 IMPLANT
BNDG ELASTIC 4INX 5YD STR LF (GAUZE/BANDAGES/DRESSINGS) IMPLANT
BNDG ELASTIC 4X5.8 VLCR STR LF (GAUZE/BANDAGES/DRESSINGS) ×3 IMPLANT
BNDG ELASTIC 6INX 5YD STR LF (GAUZE/BANDAGES/DRESSINGS) ×3 IMPLANT
BNDG GAUZE DERMACEA FLUFF 4 (GAUZE/BANDAGES/DRESSINGS) ×3 IMPLANT
CANISTER SUCTION 3000ML PPV (SUCTIONS) ×3 IMPLANT
CANNULA AORTIC ROOT 9FR (CANNULA) ×3 IMPLANT
CANNULA EZ GLIDE AORTIC 21FR (CANNULA) ×3 IMPLANT
CANNULA FEM VENOUS REMOTE 22FR (CANNULA) IMPLANT
CANNULA VESSEL 3MM BLUNT TIP (CANNULA) ×9 IMPLANT
CATH ROBINSON RED A/P 18FR (CATHETERS) ×3 IMPLANT
CATH THORACIC 36FR (CATHETERS) ×3 IMPLANT
CATH THORACIC 36FR RT ANG (CATHETERS) ×3 IMPLANT
CLIP TI MEDIUM 24 (CLIP) IMPLANT
CLIP TI WIDE RED SMALL 24 (CLIP) IMPLANT
CONTAINER PROTECT SURGISLUSH (MISCELLANEOUS) ×6 IMPLANT
COUNTER NDL 20CT MAGNET RED (NEEDLE) IMPLANT
DERMABOND ADVANCED .7 DNX12 (GAUZE/BANDAGES/DRESSINGS) IMPLANT
DRAIN CHANNEL 28F RND 3/8 FF (WOUND CARE) IMPLANT
DRAPE SRG 135X102X78XABS (DRAPES) ×3 IMPLANT
DRAPE WARM FLUID 44X44 (DRAPES) ×3 IMPLANT
DRSG COVADERM 4X14 (GAUZE/BANDAGES/DRESSINGS) ×3 IMPLANT
DRSG TEGADERM 4X4.5 CHG (GAUZE/BANDAGES/DRESSINGS) IMPLANT
ELECTRODE REM PT RTRN 9FT ADLT (ELECTROSURGICAL) ×6 IMPLANT
FELT TEFLON 1X6 (MISCELLANEOUS) ×3 IMPLANT
GAUZE SPONGE 4X4 12PLY STRL (GAUZE/BANDAGES/DRESSINGS) ×6 IMPLANT
GLOVE SS BIOGEL STRL SZ 7.5 (GLOVE) ×3 IMPLANT
GOWN STRL REUS W/ TWL LRG LVL3 (GOWN DISPOSABLE) ×12 IMPLANT
GOWN STRL REUS W/ TWL XL LVL3 (GOWN DISPOSABLE) ×6 IMPLANT
HEMOSTAT POWDER SURGIFOAM 1G (HEMOSTASIS) ×9 IMPLANT
HEMOSTAT SURGICEL 2X14 (HEMOSTASIS) ×3 IMPLANT
INSERT FOGARTY XLG (MISCELLANEOUS) IMPLANT
IV CATH 22GX1 FEP (IV SOLUTION) IMPLANT
KIT BASIN OR (CUSTOM PROCEDURE TRAY) ×3 IMPLANT
KIT DILATOR VASC 18G NDL (KITS) IMPLANT
KIT SUCTION CATH 14FR (SUCTIONS) ×6 IMPLANT
KIT TURNOVER KIT B (KITS) ×3 IMPLANT
KIT VASOVIEW HEMOPRO 2 VH 4000 (KITS) ×3 IMPLANT
MARKER GRAFT CORONARY BYPASS (MISCELLANEOUS) ×9 IMPLANT
NS IRRIG 1000ML POUR BTL (IV SOLUTION) ×15 IMPLANT
PACK E OPEN HEART (SUTURE) ×3 IMPLANT
PACK OPEN HEART (CUSTOM PROCEDURE TRAY) ×3 IMPLANT
PAD ARMBOARD POSITIONER FOAM (MISCELLANEOUS) ×6 IMPLANT
PAD ELECT DEFIB RADIOL ZOLL (MISCELLANEOUS) ×3 IMPLANT
PENCIL BUTTON HOLSTER BLD 10FT (ELECTRODE) ×3 IMPLANT
POSITIONER HEAD DONUT 9IN (MISCELLANEOUS) ×3 IMPLANT
PUNCH AORTIC ROTATE 4.0MM (MISCELLANEOUS) IMPLANT
PUNCH AORTIC ROTATE 4.5MM 8IN (MISCELLANEOUS) IMPLANT
PUNCH AORTIC ROTATE 5MM 8IN (MISCELLANEOUS) IMPLANT
SENSOR MYOCARDIAL TEMP (MISCELLANEOUS) IMPLANT
SET MPS 3-ND DEL (MISCELLANEOUS) IMPLANT
SUPPORT HEART JANKE-BARRON (MISCELLANEOUS) ×3 IMPLANT
SUT BONE WAX W31G (SUTURE) ×3 IMPLANT
SUT ETHIBOND NAB MH 2-0 36IN (SUTURE) IMPLANT
SUT MNCRL AB 4-0 PS2 18 (SUTURE) IMPLANT
SUT PROLENE 3 0 SH DA (SUTURE) ×3 IMPLANT
SUT PROLENE 4 0 SH DA (SUTURE) IMPLANT
SUT PROLENE 4-0 RB1 .5 CRCL 36 (SUTURE) IMPLANT
SUT PROLENE 6 0 C 1 30 (SUTURE) ×6 IMPLANT
SUT PROLENE 7 0 BV 1 (SUTURE) IMPLANT
SUT PROLENE 7 0 BV1 MDA (SUTURE) ×3 IMPLANT
SUT PROLENE 8 0 BV175 6 (SUTURE) IMPLANT
SUT SILK 1 MH (SUTURE) IMPLANT
SUT STEEL 6MS V (SUTURE) ×3 IMPLANT
SUT STEEL STERNAL CCS#1 18IN (SUTURE) IMPLANT
SUT STEEL SZ 6 DBL 3X14 BALL (SUTURE) ×3 IMPLANT
SUT VIC AB 1 CTX36XBRD ANBCTR (SUTURE) ×6 IMPLANT
SUT VIC AB 2-0 CT1 TAPERPNT 27 (SUTURE) IMPLANT
SUT VIC AB 2-0 CTX 27 (SUTURE) IMPLANT
SUT VIC AB 3-0 SH 27X BRD (SUTURE) IMPLANT
SUT VIC AB 3-0 X1 27 (SUTURE) IMPLANT
SYSTEM SAHARA CHEST DRAIN ATS (WOUND CARE) ×3 IMPLANT
TAPE CLOTH SURG 4X10 WHT LF (GAUZE/BANDAGES/DRESSINGS) IMPLANT
TAPE PAPER 2X10 WHT MICROPORE (GAUZE/BANDAGES/DRESSINGS) IMPLANT
TOWEL GREEN STERILE (TOWEL DISPOSABLE) ×3 IMPLANT
TOWEL GREEN STERILE FF (TOWEL DISPOSABLE) ×3 IMPLANT
TRAY FOLEY SLVR 16FR TEMP STAT (SET/KITS/TRAYS/PACK) ×3 IMPLANT
TUBE SUCT INTRACARD DLP 20F (MISCELLANEOUS) ×3 IMPLANT
TUBE SUCTION CARDIAC 10FR (CANNULA) ×3 IMPLANT
TUBING LAP HI FLOW INSUFFLATIO (TUBING) ×3 IMPLANT
UNDERPAD 30X36 HEAVY ABSORB (UNDERPADS AND DIAPERS) ×3 IMPLANT
WATER STERILE IRR 1000ML POUR (IV SOLUTION) ×6 IMPLANT

## 2023-08-16 NOTE — Hospital Course (Addendum)
 History of Present Illness: At the time of CT surgical consultation   Lindsay Camacho is a 58 yo female with known history of CAD with prior stenting, chronic HFrEF, ICD placed in 2019, MV repair, HLD, subclavian stenosis with prior stenting,  and HLD.  Her Coronary disease dates back to 2015 at which time she suffered an MI and underwent stenting of her Left Circumflex.  She suffered a retroperitoneal bleed post procedure.  Repeat catheterization in 2018 showed patent previously placed stent.  She did however undergo Minimally Invasive Mitral Valve Repair by Dr. Dusty in 2018 and overall this was uncomplicated.  She presented to OSH ED in [place], [place] with complaints of chest pain.  While being evaluated in the ED she did experience an ICD shock, interrogation revealed patient was noted to be in V. Fib at time of shock.  Workup showed elevated troponins and in setting of ICD shock they were concerned that ischemia was the source of the arrhythmia.  They performed cardiac catheterization which showed 3V CAD.  They felt transfer to [facility] was indicated for cardiothoracic surgical consultation.  Upon arrival she was started on Heparin  gtt.  Echocardiogram was obtained and showed previously repair Mitral Valve with mild MR.  There was evidence of Tricuspid Regurgitation, and EF of 55%.  She developed recurrent complaints of chest pain 8/9 described as being pressure like in quality.  This responded to Morphine .  She was also tender along LSB which would likely be expected in setting of recent ICD shock.  It was felt the patient should be placed on NTG drip and titrate as needed.  This has helped improve patient's chest pain however, she continues to experience.  She experiences shortness of breath with activity.  She is disabled.  She is a current smoker of 1/2 ppd x 40 years.. stated she was younger than 60.  She has a family history of CAD with both parents being deceased.  She lives with her daughter who  will be able to provide assistance at discharge.  The patient and all relevant studies were evaluated by Dr. Kerrin who recommended CABG.  Hospital course: Following full diagnostic evaluation and medical stabilization she was felt able to proceed and on 08/16/2023 she was taken to the operating room at which time she underwent CABG x 3.  LIMA-LAD, SVG-OM and a SVG-PDA were placed.  She tolerated the procedure well was taken to the surgical intensive care unit in stable condition.  Postoperative hospital course:  Patient was extubated using standard post cardiac surgical protocols.  Hemodynamics have been acceptable but she did require both milrinone  and Levophed  early on.  These were weaned over time.  She is AV sequential paced and AICD was recalibrated in the OR.  Blood sugars have been under good control using standard post cardiac surgical protocols.  She has required an expected diuresis for volume overload.  The advanced heart failure team has assisted with management of maximizing postoperative cardiac meds.  She does have severe tricuspid regurgitation and dilated RV.  GDMT has been initiated as able.  Ejection fraction is in the 55 to 60% range preoperatively and TEE postop showed moderately reduced EF, mildly reduced RV function.  She did have an episode of postoperative atrial fibrillation and amiodarone  was initially initiated.  He was subsequently discontinued by the heart failure team.  Milrinone  was  weaned over time without difficulty.  All routine lines, monitors and drainage devices were discontinued in standard fashion.  She  has a thrombocytopenia and did receive platelet transfusion and is also being monitored clinically.  It is likely reactive.  Ultimately platelet count has returned to normal and she has been started on Plavix  for non-STEMI. She has made  slow but steady progress in terms of physical rehab and PT/OT consults were obtained to assist.  There was some concern that she  could have developed a pulmonary embolism and CT scan was done and this was ruled out.  The critical care medicine service did assist with intensive care unit management.  Inhalers were used to assist with weaning oxygen.  She has felt likely to have COPD.  Home care arrangements have also been put in place to continue therapies.  Beta-blocker has been held and medications have been adjusted per the advanced heart failure team recommendations.  She does have expected acute blood loss anemia which is stabilized.  She has been placed on iron supplementation.  Oxygen has been weaned and she maintains good saturations on room air.  Most recent creatinine is noted to be 1.23.  Incisions are noted to be healing well without evidence of infection.  She is tolerating diet.  Overall, at the time of discharge the patient is felt to be right stable.

## 2023-08-16 NOTE — Progress Notes (Signed)
 RT NOTE:  Cardiac wean attempted and failed. Pt remains sleepy. RR < 26 throughout, VT between 6-7cc. ABG below requirement. Will attempt again at later time.

## 2023-08-16 NOTE — Anesthesia Postprocedure Evaluation (Addendum)
 Anesthesia Post Note  Patient: AZULA ZAPPIA  Procedure(s) Performed: CORONARY ARTERY BYPASS GRAFTING (CABG) TIMES THREE USING LEFT INTERNAL MAMMARY ARTERY AND ENDOSCOPICALLY HARVESTED RIGHT GREATER SAPHENOUS VEIN (Chest) ECHOCARDIOGRAM, TRANSESOPHAGEAL     Patient location during evaluation: ICU Anesthesia Type: General Level of consciousness: sedated Pain management: pain level controlled Vital Signs Assessment: post-procedure vital signs reviewed and stable Respiratory status: patient remains intubated per anesthesia plan Cardiovascular status: blood pressure returned to baseline and stable Postop Assessment: no apparent nausea or vomiting Anesthetic complications: no   No notable events documented.  Last Vitals:  Vitals:   08/16/23 1637 08/16/23 1638  BP:    Pulse: 84 84  Resp: 16 16  Temp: (!) 36 C (!) 36 C  SpO2: 100% 100%    Last Pain:  Vitals:   08/16/23 1622  TempSrc: Core  PainSc:                  Epifanio Lamar BRAVO

## 2023-08-16 NOTE — Anesthesia Procedure Notes (Signed)
 Central Venous Catheter Insertion Performed by: Epifanio Fallow, MD, anesthesiologist Start/End8/14/2025 7:20 AM, 08/16/2023 7:35 AM Patient location: Pre-op . Preanesthetic checklist: patient identified, IV checked, site marked, risks and benefits discussed, surgical consent, monitors and equipment checked, pre-op  evaluation, timeout performed and anesthesia consent Position: Trendelenburg Lidocaine  1% used for infiltration and patient sedated Hand hygiene performed , maximum sterile barriers used  and Seldinger technique used Catheter size: 9 Fr Total catheter length 10. Central line was placed.MAC introducer Swan type:thermodilution PA Cath depth:50 Procedure performed using ultrasound guided technique. Ultrasound Notes:anatomy identified, needle tip was noted to be adjacent to the nerve/plexus identified, no ultrasound evidence of intravascular and/or intraneural injection and image(s) printed for medical record Attempts: 1 Following insertion, line sutured, dressing applied and Biopatch. Post procedure assessment: blood return through all ports, free fluid flow and no air  Patient tolerated the procedure well with no immediate complications.

## 2023-08-16 NOTE — Anesthesia Procedure Notes (Signed)
 Procedure Name: Intubation Date/Time: 08/16/2023 8:21 AM  Performed by: Lettie Derrek Dacosta, CRNAPre-anesthesia Checklist: Patient identified, Patient being monitored, Timeout performed, Emergency Drugs available and Suction available Patient Re-evaluated:Patient Re-evaluated prior to induction Oxygen Delivery Method: Circle System Utilized Preoxygenation: Pre-oxygenation with 100% oxygen Induction Type: IV induction Ventilation: Mask ventilation without difficulty Laryngoscope Size: Mac and 3 Grade View: Grade I Tube type: Oral Tube size: 8.0 mm Number of attempts: 1 Airway Equipment and Method: Stylet Placement Confirmation: ETT inserted through vocal cords under direct vision, positive ETCO2 and breath sounds checked- equal and bilateral Secured at: 20 cm Tube secured with: Tape Dental Injury: Teeth and Oropharynx as per pre-operative assessment

## 2023-08-16 NOTE — Anesthesia Procedure Notes (Signed)
 Arterial Line Insertion Start/End8/14/2025 7:50 AM, 08/16/2023 7:57 AM Performed by: Epifanio Charleston, MD, anesthesiologist  Patient location: Pre-op . Preanesthetic checklist: patient identified, IV checked, site marked, risks and benefits discussed, surgical consent, monitors and equipment checked, pre-op  evaluation, timeout performed and anesthesia consent Lidocaine  1% used for infiltration Left, radial was placed Catheter size: 20 G Hand hygiene performed  and maximum sterile barriers used   Attempts: 5 or more Procedure performed using ultrasound guided technique. Ultrasound Notes:anatomy identified, needle tip was noted to be adjacent to the nerve/plexus identified, no ultrasound evidence of intravascular and/or intraneural injection and image(s) printed for medical record Following insertion, dressing applied and Biopatch. Post procedure assessment: normal and unchanged  Post procedure complications: second provider assisted, unsuccessful attempts and local hematoma. Patient tolerated the procedure well with no immediate complications.

## 2023-08-16 NOTE — Interval H&P Note (Signed)
 History and Physical Interval Note:  Odd mental status this AM.  She answers questions appropriately and follows commands but is slow to respond.  She was c/o CP and is diaphoretic so I think we have no choice but to proceed urgently with CABG.   08/16/2023 8:01 AM  Lindsay Camacho  has presented today for surgery, with the diagnosis of CORONARY ARTERY DISEASE.  The various methods of treatment have been discussed with the patient and family. After consideration of risks, benefits and other options for treatment, the patient has consented to  Procedure(s): CORONARY ARTERY BYPASS GRAFTING (CABG) (N/A) ECHOCARDIOGRAM, TRANSESOPHAGEAL (N/A) as a surgical intervention.  The patient's history has been reviewed, patient examined, no change in status, stable for surgery.  I have reviewed the patient's chart and labs.  Questions were answered to the patient's satisfaction.     [name]

## 2023-08-16 NOTE — Transfer of Care (Signed)
 Immediate Anesthesia Transfer of Care Note  Patient: Lindsay Camacho  Procedure(s) Performed: CORONARY ARTERY BYPASS GRAFTING (CABG) TIMES THREE USING LEFT INTERNAL MAMMARY ARTERY AND ENDOSCOPICALLY HARVESTED RIGHT GREATER SAPHENOUS VEIN (Chest) ECHOCARDIOGRAM, TRANSESOPHAGEAL  Patient Location: ICU  Anesthesia Type:General  Level of Consciousness: Patient remains intubated per anesthesia plan  Airway & Oxygen Therapy: Patient remains intubated per anesthesia plan and Patient placed on Ventilator (see vital sign flow sheet for setting)  Post-op Assessment: Report given to RN and Post -op Vital signs reviewed and stable  Post vital signs: Reviewed and stable  Last Vitals:  Vitals Value Taken Time  BP  113/64  Temp    Pulse 84 08/16/23 14:12  Resp 17 08/16/23 14:13  SpO2 100 % 08/16/23 14:12  Vitals shown include unfiled device data.  Last Pain:  Vitals:   08/16/23 0703  TempSrc:   PainSc: 8       Patients Stated Pain Goal: 4 (08/15/23 0824)  Complications: No notable events documented.

## 2023-08-16 NOTE — Anesthesia Preprocedure Evaluation (Signed)
 Anesthesia Evaluation  Patient identified by MRN, date of birth, ID band Patient awake    Reviewed: Allergy & Precautions, NPO status , Patient's Chart, lab work & pertinent test results  Airway Mallampati: II  TM Distance: >3 FB Neck ROM: Full    Dental  (+) Dental Advisory Given   Pulmonary shortness of breath, Current Smoker and Patient abstained from smoking.   breath sounds clear to auscultation       Cardiovascular hypertension, Pt. on medications and Pt. on home beta blockers + angina  + CAD, + Past MI, + Peripheral Vascular Disease (left subclavian artery stenosis) and +CHF  + dysrhythmias Ventricular Fibrillation + Cardiac Defibrillator + Valvular Problems/Murmurs  Rhythm:Regular Rate:Normal  EF 55-60%. S/p MV repair with ring annuluplasty. Mild MS and Mild MR. Normal RVsize and function. Mod TR   Neuro/Psych CVA    GI/Hepatic negative GI ROS, Neg liver ROS,,,  Endo/Other  negative endocrine ROS    Renal/GU negative Renal ROS     Musculoskeletal   Abdominal   Peds  Hematology negative hematology ROS (+)   Anesthesia Other Findings   Reproductive/Obstetrics                              Anesthesia Physical Anesthesia Plan  ASA: 4  Anesthesia Plan: General   Post-op Pain Management:    Induction: Intravenous  PONV Risk Score and Plan: 2 and Treatment may vary due to age or medical condition  Airway Management Planned: Oral ETT  Additional Equipment: Arterial line, CVP, PA Cath, TEE and Ultrasound Guidance Line Placement  Intra-op Plan:   Post-operative Plan: Post-operative intubation/ventilation  Informed Consent: I have reviewed the patients History and Physical, chart, labs and discussed the procedure including the risks, benefits and alternatives for the proposed anesthesia with the patient or authorized representative who has indicated his/her understanding and  acceptance.     Dental advisory given  Plan Discussed with: CRNA  Anesthesia Plan Comments:         Anesthesia Quick Evaluation

## 2023-08-16 NOTE — Brief Op Note (Signed)
 08/10/2023 - 08/16/2023  9:49 AM  PATIENT:  Manuelita LITTIE Molt  58 y.o. female  PRE-OPERATIVE DIAGNOSIS:  CORONARY ARTERY DISEASE  POST-OPERATIVE DIAGNOSIS:  CORONARY ARTERY DISEASE  PROCEDURE:  Procedure(s): CORONARY ARTERY BYPASS GRAFTING (CABG) TIMES THREE USING LEFT INTERNAL MAMMARY ARTERY AND ENDOSCOPICALLY HARVESTED RIGHT GREATER SAPHENOUS VEIN (N/A) ECHOCARDIOGRAM, TRANSESOPHAGEAL (N/A) Vein harvest time: Vein prep time:  SURGEON:  Surgeons and Role:    * Kerrin Elspeth BROCKS, MD - Primary  PHYSICIAN ASSISTANT: Norman Bier PA-C   ASSISTANTS: MALLORY HARDY RNFA   ANESTHESIA:   general  EBL:  500 mL   BLOOD ADMINISTERED:750 CC PRBC and one pack PLTS  DRAINS: PLEURAL AND MEDIASTINAL CHEST TUBES   LOCAL MEDICATIONS USED:  NONE  SPECIMEN:  No Specimen  DISPOSITION OF SPECIMEN:  N/A  COUNTS:  YES  TOURNIQUET:  * No tourniquets in log *  DICTATION: .Other Dictation: Dictation Number PENDING  PLAN OF CARE: Admit to inpatient   PATIENT DISPOSITION:  ICU - intubated and hemodynamically stable.   Delay start of Pharmacological VTE agent (>24hrs) due to surgical blood loss or risk of bleeding: yes  COMPLICATIONS: NO KNOWN

## 2023-08-16 NOTE — Progress Notes (Addendum)
 EVENING ROUNDS NOTE :     [address]       [place] [address]             [phone]                 * Day of Surgery * Procedure(s) (LRB): CORONARY ARTERY BYPASS GRAFTING (CABG) TIMES THREE USING LEFT INTERNAL MAMMARY ARTERY AND ENDOSCOPICALLY HARVESTED RIGHT GREATER SAPHENOUS VEIN (N/A) ECHOCARDIOGRAM, TRANSESOPHAGEAL (N/A)   Total Length of Stay:  LOS: 6 days  Events:   Labile pressures Low CT output Pacer turned back on     BP 124/63   Pulse 84   Temp (!) 97 F (36.1 C)   Resp 14   Ht 5' 1 (1.549 m)   Wt (P) 50.2 kg   SpO2 100%   BMI (P) 20.91 kg/m   PAP: (0-28)/(-8-15) 27/15 CVP:  [6 mmHg-12 mmHg] 8 mmHg CO:  [1.9 L/min-2.7 L/min] 2.7 L/min CI:  [1.3 L/min/m2-1.8 L/min/m2] 1.8 L/min/m2  Vent Mode: SIMV;PSV;PRVC FiO2 (%):  [50 %] 50 % Set Rate:  [16 bmp] 16 bmp Vt Set:  [380 mL] 380 mL PEEP:  [5 cmH20] 5 cmH20 Pressure Support:  [10 cmH20] 10 cmH20 Plateau Pressure:  [17 cmH20] 17 cmH20   sodium chloride      [START ON 08/17/2023] sodium chloride      sodium chloride  10 mL/hr at 08/16/23 1700   albumin  human 999 mL/hr at 08/16/23 1700    ceFAZolin  (ANCEF ) IV Stopped (08/16/23 1515)   dexmedetomidine  (PRECEDEX ) IV infusion 0.2 mcg/kg/hr (08/16/23 1700)   epinephrine  Stopped (08/16/23 1653)   insulin  0.5 Units/hr (08/16/23 1700)   lactated ringers      lactated ringers  20 mL/hr at 08/16/23 1700   magnesium  sulfate 20 mL/hr at 08/16/23 1700   milrinone  0.25 mcg/kg/min (08/16/23 1700)   nitroGLYCERIN  Stopped (08/16/23 1430)   norepinephrine  (LEVOPHED ) Adult infusion 4 mcg/min (08/16/23 1700)   phenylephrine  (NEO-SYNEPHRINE) Adult infusion Stopped (08/16/23 1500)   vancomycin       I/O last 3 completed shifts: In: 875.9 [P.O.:480; I.V.:395.9] Out: -       Latest Ref Rng & Units 08/16/2023    2:22 PM 08/16/2023    1:07 PM 08/16/2023    1:02 PM  CBC  WBC 4.0 - 10.5 K/uL 10.3     Hemoglobin 12.0 - 15.0 g/dL 88.6  8.8  9.2    Hematocrit 36.0 - 46.0 % 33.2  26.0  27.0   Platelets 150 - 400 K/uL 114          Latest Ref Rng & Units 08/16/2023    1:07 PM 08/16/2023    1:02 PM 08/16/2023   12:02 PM  BMP  Glucose 70 - 99 mg/dL 844   99   BUN 6 - 20 mg/dL 11   10   Creatinine 9.55 - 1.00 mg/dL 9.29   9.39   Sodium 864 - 145 mmol/L 131  132  136   Potassium 3.5 - 5.1 mmol/L 5.3  5.3  6.1   Chloride 98 - 111 mmol/L 98   97     ABG    Component Value Date/Time   PHART 7.455 (H) 08/16/2023 1302   PCO2ART 31.7 (L) 08/16/2023 1302   PO2ART 302 (H) 08/16/2023 1302   HCO3 22.3 08/16/2023 1302   TCO2 24 08/16/2023 1307   ACIDBASEDEF 1.0 08/16/2023 1302   O2SAT 65.2 08/16/2023 1556       Shamari Trostel, MD 08/16/2023 5:18 PM

## 2023-08-16 NOTE — Progress Notes (Signed)
 Patient failed first wean attempt (see ABG from 2235). Second attempt at weaning to extubate at 2346; patient tachypneic, breathing 40 breaths per minute, and unable to maintain volumes. Wean discontinued.

## 2023-08-16 NOTE — Interval H&P Note (Signed)
 History and Physical Interval Note:  08/16/2023 7:04 AM  Lindsay Camacho  has presented today for surgery, with the diagnosis of CAD.  The various methods of treatment have been discussed with the patient and family. After consideration of risks, benefits and other options for treatment, the patient has consented to  Procedure(s): CORONARY ARTERY BYPASS GRAFTING (CABG) (N/A) ECHOCARDIOGRAM, TRANSESOPHAGEAL (N/A) as a surgical intervention.  The patient's history has been reviewed, patient examined, no change in status, stable for surgery.  I have reviewed the patient's chart and labs.  Questions were answered to the patient's satisfaction.     [name]

## 2023-08-17 ENCOUNTER — Encounter (HOSPITAL_COMMUNITY): Payer: Self-pay | Admitting: Thoracic Surgery (Cardiothoracic Vascular Surgery)

## 2023-08-17 ENCOUNTER — Inpatient Hospital Stay (HOSPITAL_COMMUNITY)

## 2023-08-17 DIAGNOSIS — R579 Shock, unspecified: Secondary | ICD-10-CM

## 2023-08-17 DIAGNOSIS — I10 Essential (primary) hypertension: Secondary | ICD-10-CM

## 2023-08-17 DIAGNOSIS — I214 Non-ST elevation (NSTEMI) myocardial infarction: Secondary | ICD-10-CM | POA: Diagnosis not present

## 2023-08-17 DIAGNOSIS — R739 Hyperglycemia, unspecified: Secondary | ICD-10-CM | POA: Diagnosis not present

## 2023-08-17 DIAGNOSIS — I4901 Ventricular fibrillation: Secondary | ICD-10-CM

## 2023-08-17 LAB — POCT I-STAT 7, (LYTES, BLD GAS, ICA,H+H)
Acid-Base Excess: 0 mmol/L (ref 0.0–2.0)
Acid-Base Excess: 0 mmol/L (ref 0.0–2.0)
Acid-Base Excess: 2 mmol/L (ref 0.0–2.0)
Bicarbonate: 26.3 mmol/L (ref 20.0–28.0)
Bicarbonate: 26.6 mmol/L (ref 20.0–28.0)
Bicarbonate: 28.3 mmol/L — ABNORMAL HIGH (ref 20.0–28.0)
Calcium, Ion: 1.26 mmol/L (ref 1.15–1.40)
Calcium, Ion: 1.25 mmol/L (ref 1.15–1.40)
Calcium, Ion: 1.22 mmol/L (ref 1.15–1.40)
HCT: 21 % — ABNORMAL LOW (ref 36.0–46.0)
HCT: 22 % — ABNORMAL LOW (ref 36.0–46.0)
HCT: 23 % — ABNORMAL LOW (ref 36.0–46.0)
Hemoglobin: 7.1 g/dL — ABNORMAL LOW (ref 12.0–15.0)
Hemoglobin: 7.5 g/dL — ABNORMAL LOW (ref 12.0–15.0)
Hemoglobin: 7.8 g/dL — ABNORMAL LOW (ref 12.0–15.0)
O2 Saturation: 97 %
O2 Saturation: 95 %
O2 Saturation: 96 %
Patient temperature: 36.7
Patient temperature: 36.7
Patient temperature: 37.1
Potassium: 4.3 mmol/L (ref 3.5–5.1)
Potassium: 4.4 mmol/L (ref 3.5–5.1)
Potassium: 4 mmol/L (ref 3.5–5.1)
Sodium: 141 mmol/L (ref 135–145)
Sodium: 141 mmol/L (ref 135–145)
Sodium: 138 mmol/L (ref 135–145)
TCO2: 28 mmol/L (ref 22–32)
TCO2: 28 mmol/L (ref 22–32)
TCO2: 30 mmol/L (ref 22–32)
pCO2 arterial: 53.3 mmHg — ABNORMAL HIGH (ref 32–48)
pCO2 arterial: 52.5 mmHg — ABNORMAL HIGH (ref 32–48)
pCO2 arterial: 55.5 mmHg — ABNORMAL HIGH (ref 32–48)
pH, Arterial: 7.3 — ABNORMAL LOW (ref 7.35–7.45)
pH, Arterial: 7.311 — ABNORMAL LOW (ref 7.35–7.45)
pH, Arterial: 7.316 — ABNORMAL LOW (ref 7.35–7.45)
pO2, Arterial: 103 mmHg (ref 83–108)
pO2, Arterial: 80 mmHg — ABNORMAL LOW (ref 83–108)
pO2, Arterial: 90 mmHg (ref 83–108)

## 2023-08-17 LAB — CBC
HCT: 23.6 % — ABNORMAL LOW (ref 36.0–46.0)
HCT: 23.1 % — ABNORMAL LOW (ref 36.0–46.0)
Hemoglobin: 8.1 g/dL — ABNORMAL LOW (ref 12.0–15.0)
Hemoglobin: 7.8 g/dL — ABNORMAL LOW (ref 12.0–15.0)
MCH: 30.5 pg (ref 26.0–34.0)
MCH: 30.4 pg (ref 26.0–34.0)
MCHC: 34.3 g/dL (ref 30.0–36.0)
MCHC: 33.8 g/dL (ref 30.0–36.0)
MCV: 88.7 fL (ref 80.0–100.0)
MCV: 89.9 fL (ref 80.0–100.0)
Platelets: 76 K/uL — ABNORMAL LOW (ref 150–400)
Platelets: 92 K/uL — ABNORMAL LOW (ref 150–400)
RBC: 2.66 MIL/uL — ABNORMAL LOW (ref 3.87–5.11)
RBC: 2.57 MIL/uL — ABNORMAL LOW (ref 3.87–5.11)
RDW: 14.3 % (ref 11.5–15.5)
RDW: 14.7 % (ref 11.5–15.5)
WBC: 10.7 K/uL — ABNORMAL HIGH (ref 4.0–10.5)
WBC: 14.6 K/uL — ABNORMAL HIGH (ref 4.0–10.5)
nRBC: 0 % (ref 0.0–0.2)
nRBC: 0 % (ref 0.0–0.2)

## 2023-08-17 LAB — BASIC METABOLIC PANEL WITH GFR
Anion gap: 10 (ref 5–15)
Anion gap: 8 (ref 5–15)
BUN: 10 mg/dL (ref 6–20)
BUN: 10 mg/dL (ref 6–20)
CO2: 25 mmol/L (ref 22–32)
CO2: 26 mmol/L (ref 22–32)
Calcium: 8.3 mg/dL — ABNORMAL LOW (ref 8.9–10.3)
Calcium: 8.2 mg/dL — ABNORMAL LOW (ref 8.9–10.3)
Chloride: 106 mmol/L (ref 98–111)
Chloride: 101 mmol/L (ref 98–111)
Creatinine, Ser: 0.9 mg/dL (ref 0.44–1.00)
Creatinine, Ser: 0.91 mg/dL (ref 0.44–1.00)
GFR, Estimated: >60 mL/min (ref >60)
GFR, Estimated: >60 mL/min (ref >60)
Glucose, Bld: 108 mg/dL — ABNORMAL HIGH (ref 70–99)
Glucose, Bld: 121 mg/dL — ABNORMAL HIGH (ref 70–99)
Potassium: 4.3 mmol/L (ref 3.5–5.1)
Potassium: 3.9 mmol/L (ref 3.5–5.1)
Sodium: 141 mmol/L (ref 135–145)
Sodium: 135 mmol/L (ref 135–145)

## 2023-08-17 LAB — PREPARE FRESH FROZEN PLASMA

## 2023-08-17 LAB — BPAM FFP
Blood Product Expiration Date: 202508162359
Blood Product Expiration Date: 202508162359
ISSUE DATE / TIME: 202508141615
ISSUE DATE / TIME: 202508141615
Unit Type and Rh: 600
Unit Type and Rh: 6200

## 2023-08-17 LAB — COOXEMETRY PANEL
Carboxyhemoglobin: 1.4 % (ref 0.5–1.5)
Carboxyhemoglobin: 1.6 % — ABNORMAL HIGH (ref 0.5–1.5)
Methemoglobin: 0.7 % (ref 0.0–1.5)
Methemoglobin: <0.7 % (ref 0.0–1.5)
O2 Saturation: 63.2 %
O2 Saturation: 73.1 %
Total hemoglobin: 7.2 g/dL — ABNORMAL LOW (ref 12.0–16.0)
Total hemoglobin: <6.9 g/dL — CL (ref 12.0–16.0)

## 2023-08-17 LAB — BPAM PLATELET PHERESIS
Blood Product Expiration Date: 202508172359
ISSUE DATE / TIME: 202508141233
Unit Type and Rh: 5100

## 2023-08-17 LAB — GLUCOSE, CAPILLARY
Glucose-Capillary: 111 mg/dL — ABNORMAL HIGH (ref 70–99)
Glucose-Capillary: 112 mg/dL — ABNORMAL HIGH (ref 70–99)
Glucose-Capillary: 112 mg/dL — ABNORMAL HIGH (ref 70–99)
Glucose-Capillary: 116 mg/dL — ABNORMAL HIGH (ref 70–99)
Glucose-Capillary: 120 mg/dL — ABNORMAL HIGH (ref 70–99)
Glucose-Capillary: 121 mg/dL — ABNORMAL HIGH (ref 70–99)
Glucose-Capillary: 128 mg/dL — ABNORMAL HIGH (ref 70–99)
Glucose-Capillary: 138 mg/dL — ABNORMAL HIGH (ref 70–99)
Glucose-Capillary: 140 mg/dL — ABNORMAL HIGH (ref 70–99)
Glucose-Capillary: 140 mg/dL — ABNORMAL HIGH (ref 70–99)
Glucose-Capillary: 144 mg/dL — ABNORMAL HIGH (ref 70–99)

## 2023-08-17 LAB — PREPARE PLATELET PHERESIS: Unit division: 0

## 2023-08-17 LAB — MAGNESIUM
Magnesium: 2.1 mg/dL (ref 1.7–2.4)
Magnesium: 1.6 mg/dL — ABNORMAL LOW (ref 1.7–2.4)

## 2023-08-17 MED ORDER — INSULIN ASPART 100 UNIT/ML IJ SOLN
0.0000 [IU] | Freq: Three times a day (TID) | INTRAMUSCULAR | Status: DC
  Administered 2023-08-18: 2 [IU] via SUBCUTANEOUS

## 2023-08-17 MED ORDER — ACETAMINOPHEN 325 MG PO TABS
650.0000 mg | ORAL_TABLET | Freq: Four times a day (QID) | ORAL | Status: DC
  Administered 2023-08-18 – 2023-08-23 (×18): 650 mg via ORAL
  Filled 2023-08-17 (×18): qty 2

## 2023-08-17 MED ORDER — ALBUTEROL SULFATE (2.5 MG/3ML) 0.083% IN NEBU
2.5000 mg | INHALATION_SOLUTION | Freq: Four times a day (QID) | RESPIRATORY_TRACT | Status: DC | PRN
  Administered 2023-08-21: 2.5 mg via RESPIRATORY_TRACT
  Filled 2023-08-17: qty 3

## 2023-08-17 MED ORDER — METHOCARBAMOL 1000 MG/10ML IJ SOLN
500.0000 mg | Freq: Three times a day (TID) | INTRAMUSCULAR | Status: DC
  Administered 2023-08-17 – 2023-08-18 (×3): 500 mg via INTRAVENOUS
  Filled 2023-08-17 (×3): qty 10

## 2023-08-17 MED ORDER — CYCLOBENZAPRINE HCL 10 MG PO TABS: 10.0000 mg | ORAL_TABLET | Freq: Three times a day (TID) | ORAL | Status: DC | PRN

## 2023-08-17 MED ORDER — BEMPEDOIC ACID 180 MG PO TABS: 180.0000 mg | ORAL_TABLET | Freq: Every day | ORAL | Status: DC

## 2023-08-17 MED ORDER — INSULIN GLARGINE-YFGN 100 UNIT/ML ~~LOC~~ SOLN
5.0000 [IU] | Freq: Every day | SUBCUTANEOUS | Status: DC
  Administered 2023-08-17 – 2023-08-18 (×2): 5 [IU] via SUBCUTANEOUS
  Filled 2023-08-17 (×3): qty 0.05

## 2023-08-17 MED ORDER — FUROSEMIDE 10 MG/ML IJ SOLN
20.0000 mg | Freq: Two times a day (BID) | INTRAMUSCULAR | Status: DC
  Administered 2023-08-17 – 2023-08-18 (×4): 20 mg via INTRAVENOUS
  Filled 2023-08-17 (×4): qty 2

## 2023-08-17 MED ORDER — ALBUMIN HUMAN 5 % IV SOLN
12.5000 g | Freq: Once | INTRAVENOUS | Status: AC
  Administered 2023-08-17: 12.5 g via INTRAVENOUS
  Filled 2023-08-17: qty 250

## 2023-08-17 MED ORDER — LIDOCAINE 5 % EX PTCH
1.0000 | MEDICATED_PATCH | CUTANEOUS | Status: DC
  Administered 2023-08-17 – 2023-08-22 (×6): 1 via TRANSDERMAL
  Filled 2023-08-17 (×7): qty 1

## 2023-08-17 MED ORDER — ACETAMINOPHEN 10 MG/ML IV SOLN
1000.0000 mg | Freq: Four times a day (QID) | INTRAVENOUS | Status: AC
  Administered 2023-08-17 – 2023-08-18 (×3): 1000 mg via INTRAVENOUS
  Filled 2023-08-17 (×3): qty 100

## 2023-08-17 MED ORDER — ORAL CARE MOUTH RINSE: 15.0000 mL | OROMUCOSAL | Status: DC | PRN

## 2023-08-17 MED FILL — Sodium Bicarbonate IV Soln 8.4%: INTRAVENOUS | Qty: 50 | Status: AC

## 2023-08-17 MED FILL — Heparin Sodium (Porcine) Inj 1000 Unit/ML: INTRAMUSCULAR | Qty: 10 | Status: AC

## 2023-08-17 MED FILL — Electrolyte-R (PH 7.4) Solution: INTRAVENOUS | Qty: 8000 | Status: AC

## 2023-08-17 MED FILL — Calcium Chloride Inj 10%: INTRAVENOUS | Qty: 10 | Status: AC

## 2023-08-17 MED FILL — Sodium Chloride IV Soln 0.9%: INTRAVENOUS | Qty: 2000 | Status: AC

## 2023-08-17 MED FILL — Mannitol IV Soln 20%: INTRAVENOUS | Qty: 500 | Status: AC

## 2023-08-17 MED FILL — Heparin Sodium (Porcine) Inj 1000 Unit/ML: INTRAMUSCULAR | Qty: 30 | Status: AC

## 2023-08-17 MED FILL — Lidocaine HCl Local Soln Prefilled Syringe 100 MG/5ML (2%): INTRAMUSCULAR | Qty: 5 | Status: AC

## 2023-08-17 NOTE — Consult Note (Addendum)
 Advanced Heart Failure Team Consult Note   Primary Physician: Norrine Sharper, MD Cardiologist:  Oneil Parchment, MD  Reason for Consultation: post cardiotomy shock   HPI:    Lindsay Camacho is seen today for evaluation of post cardiotomy shock at the request of Dr. Kerrin, CT surgery.   58 y/o female w/ CAD s/p multiple MIs and PCIs in the past. H/o systolic heart failure w/ improved EF. EF previously as low as 25% in the past, ultimately got ICD and EF normalized on echo 12/24 (55-60%),  h/o mitral regurgitation s/p previous minimally invasive MVR via right thoracotomy by Dr. Dusty in 2018.   She presented initially to [facility] in [place] for unstable angina and ruled in for NSTEMI. While in the ED, her ICD appropriately shocked her for VF. Cath showed severe 3V CAD. She was transferred to [facility] for CABG evaluation.  Echo done here at [facility] showed normal LVEF 55-60%, RV normal. Mitral valve ok. Mild MR and mild MS (mG 4 mmHg).   Underwent CABG x 3 (LIMA-LAD, SVG-OM1, SVG-PDA).   POD#1.   She has been extubated but slow to wean off pressors and inotropes. Continues on NE 6 + Milrinone  0.25. Co-ox 63%. CI off swan 2.03  PAP 25/12 (17), CVP 9. SCr ok 0.90. UOP good, 3L out yesterday.   AHF team asked to assist w/ post op management.   Intra-op TEE report currently not available.     Home Medications Prior to Admission medications   Medication Sig Start Date End Date Taking? Authorizing Provider  amLODipine  (NORVASC ) 10 MG tablet Take 1 tablet (10 mg total) by mouth daily. 07/03/23  Yes Ladona Heinz, MD  aspirin  EC 81 MG tablet Take 81 mg by mouth daily.   Yes [provider]  atorvastatin  (LIPITOR ) 80 MG tablet Take 1 tablet (80 mg total) by mouth daily. 09/07/20  Yes Waddell Danelle ORN, MD  Bempedoic Acid  (NEXLETOL ) 180 MG TABS Take 1 tablet (180 mg total) by mouth daily. 07/10/23  Yes West, Katlyn D, NP  clopidogrel  (PLAVIX ) 75 MG tablet TAKE 1 TABLET BY MOUTH DAILY  03/09/23  Yes Parchment Oneil BROCKS, MD  cyclobenzaprine  (FLEXERIL ) 10 MG tablet Take 1 tablet (10 mg total) by mouth 3 (three) times daily as needed for muscle spasms. 05/19/22  Yes Vicky Charleston, PA-C  furosemide  (LASIX ) 40 MG tablet Take 1 tablet (40 mg total) by mouth daily as needed for fluid or edema (shortness of breath). 03/05/20  Yes Lesia Sharper Barter, PA-C  losartan  (COZAAR ) 100 MG tablet Take 1 tablet (100 mg total) by mouth daily. 06/11/23  Yes Parchment Oneil BROCKS, MD  meloxicam (MOBIC) 15 MG tablet Take 15 mg by mouth daily as needed for pain. 02/09/23  Yes [provider]  Menthol-Methyl Salicylate (SALONPAS PAIN RELIEF PATCH EX) Apply 1 patch topically daily as needed (pain).   Yes [provider]  metoprolol  succinate (TOPROL -XL) 50 MG 24 hr tablet Take 2 tablets (100 mg total) by mouth daily. 07/10/23  Yes West, Katlyn D, NP  spironolactone  (ALDACTONE ) 25 MG tablet Take 0.5 tablets (12.5 mg total) by mouth daily. 07/10/23  Yes West, Katlyn D, NP  VENTOLIN  HFA 108 (90 Base) MCG/ACT inhaler Inhale 1-2 puffs into the lungs every 4 (four) hours as needed for shortness of breath. 05/22/23  Yes Darron Deatrice LABOR, MD    Past Medical History: Past Medical History:  Diagnosis Date   Acute myocardial infarction of other lateral wall, initial  episode of care 01/2013   DES CFX   Aortoiliac occlusive disease (HCC) 08/22/2016   CAD (coronary artery disease) 11/13/2011   50% ? proximal LCx stenosis per Cath at [facility]   Chest pain, atypical, muscular skelatal   01/09/2014   COVID 02/08/2020   Dyspnea    Heart failure (HCC)    HLD (hyperlipidemia)    Hypertension    Intermediate coronary syndrome (HCC)    LBBB (left bundle branch block)    Mitral regurgitation    Nonischemic cardiomyopathy (HCC) 01/26/2017   Presence of combination internal cardiac defibrillator (ICD) and pacemaker    PVD (peripheral vascular disease) (HCC)    Right CFA stenosis per report on 11/14/2011   S/P minimally  invasive mitral valve repair 08/25/2016   Edwards McArthy-Adams IMR ETLogix ring annuloplasty (Model 4100, Serial [id number], size 26) placed via right mini thoracotomy approach   Stenosis of left subclavian artery (HCC) 08/22/2016   Stroke (HCC)    tia with no deficits    Past Surgical History: Past Surgical History:  Procedure Laterality Date   AORTIC ARCH ANGIOGRAPHY N/A 09/21/2021   Procedure: AORTIC ARCH ANGIOGRAPHY;  Surgeon: Darron Deatrice LABOR, MD;  Location: [facility] INVASIVE CV LAB;  Service: Cardiovascular;  Laterality: N/A;   APPENDECTOMY     BIV ICD INSERTION CRT-D N/A 01/24/2017   Procedure: BIV ICD INSERTION CRT-D;  Surgeon: Waddell Danelle ORN, MD;  Location: [facility] INVASIVE CV LAB;  Service: Cardiovascular;  Laterality: N/A;   CARDIAC CATHETERIZATION  04/14/2013   Non-obstructive disease, patent CFX stent   CARDIAC CATHETERIZATION N/A 09/21/2014   Procedure: Left Heart Cath and Coronary Angiography;  Surgeon: Debby LABOR Sor, MD;  Location: [facility] INVASIVE CV LAB;  Service: Cardiovascular;  Laterality: N/A;   CORONARY ANGIOPLASTY WITH STENT PLACEMENT  01/17/2013   mild disease except 99% CFX, rx with  2.5 x 28 Alpine drug-eluting stent    CORONARY ARTERY BYPASS GRAFT N/A 08/16/2023   Procedure: CORONARY ARTERY BYPASS GRAFTING (CABG) TIMES THREE USING LEFT INTERNAL MAMMARY ARTERY AND ENDOSCOPICALLY HARVESTED RIGHT GREATER SAPHENOUS VEIN;  Surgeon: Kerrin Elspeth BROCKS, MD;  Location: [facility] OR;  Service: Open Heart Surgery;  Laterality: N/A;   GROIN DEBRIDEMENT Right 04/14/2013   Procedure: Emergency Evacuation of Retroperitoneal Hematoma and Repair Right External Iliac Artery Pseudoaneurysm    ;  Surgeon: Carlin FORBES Haddock, MD;  Location: [facility] OR;  Service: Vascular;  Laterality: Right;   LEFT HEART CATHETERIZATION WITH CORONARY ANGIOGRAM N/A 01/18/2013   Procedure: LEFT HEART CATHETERIZATION WITH CORONARY ANGIOGRAM;  Surgeon: Candyce GORMAN Reek, MD;  Location: [facility] CATH LAB;  Service: Cardiovascular;   Laterality: N/A;   LEFT HEART CATHETERIZATION WITH CORONARY ANGIOGRAM N/A 04/14/2013   Procedure: LEFT HEART CATHETERIZATION WITH CORONARY ANGIOGRAM;  Surgeon: Candyce GORMAN Reek, MD;  Location: [facility] CATH LAB;  Service: Cardiovascular;  Laterality: N/A;   MITRAL VALVE REPAIR Right 08/25/2016   Procedure: MINIMALLY INVASIVE MITRAL VALVE REPAIR;  Surgeon: Dusty Sudie DEL, MD;  Location: [facility] OR;  Service: Open Heart Surgery;  Laterality: Right;   MULTIPLE EXTRACTIONS WITH ALVEOLOPLASTY N/A 08/23/2016   Procedure: Extraction of tooth #'s 17, 20-23, 26-29 with alveoloplasty;  Surgeon: Cyndee Tanda FALCON, DDS;  Location: [facility] OR;  Service: Oral Surgery;  Laterality: N/A;   PERCUTANEOUS CORONARY STENT INTERVENTION (PCI-S)  01/18/2013   Procedure: PERCUTANEOUS CORONARY STENT INTERVENTION (PCI-S);  Surgeon: Candyce GORMAN Reek, MD;  Location: [facility] CATH LAB;  Service: Cardiovascular;;   RIGHT/LEFT HEART CATH AND CORONARY ANGIOGRAPHY N/A 08/21/2016  Procedure: RIGHT/LEFT HEART CATH AND CORONARY ANGIOGRAPHY;  Surgeon: Verlin Lonni BIRCH, MD;  Location: [facility] INVASIVE CV LAB;  Service: Cardiovascular;  Laterality: N/A;   TEE WITHOUT CARDIOVERSION N/A 03/06/2014   Procedure: TRANSESOPHAGEAL ECHOCARDIOGRAM (TEE);  Surgeon: Leim VEAR Moose, MD;  Location: [facility] ENDOSCOPY;  Service: Cardiovascular;  Laterality: N/A;   TEE WITHOUT CARDIOVERSION N/A 08/18/2016   Procedure: TRANSESOPHAGEAL ECHOCARDIOGRAM (TEE);  Surgeon: Pietro Redell RAMAN, MD;  Location: [facility] ENDOSCOPY;  Service: Cardiovascular;  Laterality: N/A;   TEE WITHOUT CARDIOVERSION N/A 08/25/2016   Procedure: TRANSESOPHAGEAL ECHOCARDIOGRAM (TEE);  Surgeon: Dusty Sudie VEAR, MD;  Location: [facility] OR;  Service: Open Heart Surgery;  Laterality: N/A;   TEE WITHOUT CARDIOVERSION N/A 08/16/2023   Procedure: ECHOCARDIOGRAM, TRANSESOPHAGEAL;  Surgeon: Kerrin Elspeth BROCKS, MD;  Location: [facility] OR;  Service: Open Heart Surgery;  Laterality: N/A;   TUBAL LIGATION     UPPER EXTREMITY  INTERVENTION  09/21/2021   Procedure: UPPER EXTREMITY INTERVENTION;  Surgeon: Darron Deatrice LABOR, MD;  Location: [facility] INVASIVE CV LAB;  Service: Cardiovascular;;    Family History: Family History  Problem Relation Age of Onset   CAD Mother 65   Lung cancer Mother    Bladder Cancer Mother    Stroke Mother    Heart disease Mother        Before age 3   Hypertension Mother    Heart attack Mother    CAD Father 52   Heart disease Father        Before age 41   Heart attack Father    Stroke Father        Bleeding stroke    Social History: Social History   Socioeconomic History   Marital status: Single    Spouse name: Not on file   Number of children: Not on file   Years of education: Not on file   Highest education level: Not on file  Occupational History   Not on file  Tobacco Use   Smoking status: Every Day    Current packs/day: 0.50    Types: Cigarettes   Smokeless tobacco: Never  Vaping Use   Vaping status: Never Used  Substance and Sexual Activity   Alcohol  use: No    Alcohol /week: 0.0 standard drinks of alcohol    Drug use: Not Currently    Types: Marijuana   Sexual activity: Yes    Birth control/protection: None  Other Topics Concern   Not on file  Social History Narrative   Not on file   Social Drivers of Health   Financial Resource Strain: Not on file  Food Insecurity: No Food Insecurity (08/10/2023)   Hunger Vital Sign    Worried About Running Out of Food in the Last Year: Never true    Ran Out of Food in the Last Year: Never true  Transportation Needs: No Transportation Needs (08/10/2023)   PRAPARE - Administrator, Civil Service (Medical): No    Lack of Transportation (Non-Medical): No  Physical Activity: Not on file  Stress: Not on file  Social Connections: Unknown (08/10/2023)   Social Connection and Isolation Panel    Frequency of Communication with Friends and Family: Three times a week    Frequency of Social Gatherings with Friends and  Family: Twice a week    Attends Religious Services: Not on Marketing executive or Organizations: Not on file    Attends Banker Meetings: Not on file    Marital Status: Not  on file    Allergies:  Allergies  Allergen Reactions   Aldomet [Methyldopa] Other (See Comments)    Severe hypotension   Brilinta  [Ticagrelor ] Shortness Of Breath   Other Other (See Comments)    Nuclear Stress Test Medication caused seizures   Coumadin  [Warfarin Sodium ] Swelling    Arm swelling   Desyrel [Trazodone] Other (See Comments)    Could not wake up from medication, comatosed   Eliquis [Apixaban] Other (See Comments)    BP got too low   Entresto  [Sacubitril -Valsartan ] Hypertension    Hypotension    Zestril  [Lisinopril ]     Significant blood pressure fluctuations    Penicillins Rash    Objective:    Vital Signs:   Temp:  [96.1 F (35.6 C)-98.6 F (37 C)] 98.4 F (36.9 C) (08/15 1230) Pulse Rate:  [64-98] 96 (08/15 1230) Resp:  [0-32] 19 (08/15 1230) BP: (94-124)/(45-63) 124/63 (08/14 1706) SpO2:  [83 %-100 %] 97 % (08/15 1230) Arterial Line BP: (65-166)/(39-79) 119/49 (08/15 1230) FiO2 (%):  [40 %-50 %] 40 % (08/15 0425) Weight:  [54.6 kg] 54.6 kg (08/15 0500) Last BM Date :  (PTA)  Weight change: Filed Weights   08/10/23 1700 08/16/23 0655 08/17/23 0500  Weight: 50.2 kg (P) 50.2 kg 54.6 kg    Intake/Output:   Intake/Output Summary (Last 24 hours) at 08/17/2023 1400 Last data filed at 08/17/2023 1200 Gross per 24 hour  Intake 3934.96 ml  Output 3945 ml  Net -10.04 ml      Physical Exam   General: chronically ill appearing, looks much older than actual age. No respiratory difficulty   Neck: + RIJ swan, CVP 8 cm  Cor:  RRR, + sternal dressing, + CTs Lungs: clear   Abdomen: soft, nontender, nondistended.  Extremities: warm, no edema   Telemetry    A paced 80s, personally reviewed   EKG    A-V paced 84 bpm, personally reviewed   Labs   Basic  Metabolic Panel: Recent Labs  Lab 08/14/23 0452 08/15/23 0407 08/16/23 0438 08/16/23 0829 08/16/23 1131 08/16/23 1202 08/16/23 1302 08/16/23 1307 08/16/23 1420 08/16/23 1926 08/16/23 2040 08/16/23 2235 08/17/23 0441 08/17/23 0442 08/17/23 0602  NA 136 137 136   < > 138 136   < > 131*   < > 138 140 140 141 141 141  K 3.8 4.1 4.4   < > 5.4* 6.1*   < > 5.3*   < > 4.2 4.0 4.0 4.3 4.3 4.4  CL 103 102 100   < > 97* 97*  --  98  --  104  --   --  106  --   --   CO2 25 27 30   --   --   --   --   --   --  24  --   --  25  --   --   GLUCOSE 88 84 83   < > 101* 99  --  155*  --  130*  --   --  108*  --   --   BUN 13 11 14    < > 9 10  --  11  --  11  --   --  10  --   --   CREATININE 0.97 0.95 0.97   < > 0.60 0.60  --  0.70  --  0.96  --   --  0.90  --   --   CALCIUM  8.6* 9.1 9.4  --   --   --   --   --   --  8.7*  --   --  8.3*  --   --   MG 1.6* 1.9 1.7  --   --   --   --   --   --  3.4*  --   --  2.1  --   --    < > = values in this interval not displayed.    Liver Function Tests: Recent Labs  Lab 08/10/23 2236  AST 25  ALT 13  ALKPHOS 71  BILITOT 1.1  PROT 6.1*  ALBUMIN  3.0*   No results for input(s): LIPASE, AMYLASE in the last 168 hours. No results for input(s): AMMONIA in the last 168 hours.  CBC: Recent Labs  Lab 08/10/23 2236 08/11/23 1412 08/15/23 0407 08/16/23 0438 08/16/23 0829 08/16/23 1157 08/16/23 1202 08/16/23 1422 08/16/23 1926 08/16/23 2040 08/16/23 2235 08/17/23 0441 08/17/23 0442 08/17/23 0602  WBC 7.5   < > 7.1 7.4  --   --   --  10.3 6.2  --   --  10.7*  --   --   NEUTROABS 6.8  --   --   --   --   --   --   --   --   --   --   --   --   --   HGB 14.4   < > 14.5 16.2*   < > 8.6*   < > 11.3* 7.9* 6.8* 7.5* 8.1* 7.1* 7.5*  HCT 44.2   < > 43.4 49.1*   < > 25.2*   < > 33.2* 23.2* 20.0* 22.0* 23.6* 21.0* 22.0*  MCV 87.7   < > 87.0 88.6  --   --   --  88.5 88.9  --   --  88.7  --   --   PLT 185   < > 169 191  --  68*  --  114* 55*  --   --   76*  --   --    < > = values in this interval not displayed.    Cardiac Enzymes: No results for input(s): CKTOTAL, CKMB, CKMBINDEX, TROPONINI in the last 168 hours.  BNP: BNP (last 3 results) No results for input(s): BNP in the last 8760 hours.  ProBNP (last 3 results) No results for input(s): PROBNP in the last 8760 hours.   CBG: Recent Labs  Lab 08/17/23 0535 08/17/23 0644 08/17/23 0848 08/17/23 1019 08/17/23 1131  GLUCAP 116* 140* 121* 112* 112*    Coagulation Studies: Recent Labs    08/16/23 0438 08/16/23 1422  LABPROT 14.2 22.6*  INR 1.0 1.9*     Imaging   DG Chest Port 1 View Result Date: 08/17/2023 CLINICAL DATA:  Status post coronary artery bypass graft. EXAM: PORTABLE CHEST 1 VIEW COMPARISON:  August 16, 2023. FINDINGS: Stable cardiomegaly. Status post cardiac valve repair. Left-sided defibrillator is unchanged. Right internal jugular Swan-Ganz catheter is noted with tip in expected position of main pulmonary artery. Mild bibasilar subsegmental atelectasis is noted. Bilateral chest tubes are noted without definite pneumothorax. Bony thorax is unremarkable. IMPRESSION: Mild bibasilar subsegmental atelectasis. Right-sided chest tube without definite pneumothorax. Electronically Signed   By: Lynwood Landy Raddle M.D.   On: 08/17/2023 08:28   DG Chest Port 1 View Result Date: 08/16/2023 CLINICAL DATA:  [id number] S/P CABG x 3 [id number]. EXAM: PORTABLE CHEST 1 VIEW COMPARISON:  08/15/2023. FINDINGS: Bilateral lung fields are clear. Bilateral costophrenic angles are clear. Stable cardio-mediastinal silhouette. There are surgical staples along the heart border  and sternotomy wires, status post CABG (coronary artery bypass graft). There is a left sided 3-lead pacemaker. Mitral annuloplasty noted. No acute osseous abnormalities. The soft tissues are within normal limits. *Endotracheal tube noted with its tip approximately 2.5-3 cm above the carina. *Enteric tube is seen  coursing below the left hemidiaphragm with its tip outside the field of view. *Probable left-sided pleural drainage catheter noted with its tip overlapping with the cardiac pacemaker battery pack. *Probable right-sided pleural drainage catheter/overlying monitoring lead along the right hemidiaphragm. *Right IJ Swan-Ganz sheath noted. *Right IJ Swan-Ganz catheter with its tip overlying the main pulmonary outflow tract. *Probable epicardial pacer wires noted. IMPRESSION: *No acute cardiopulmonary abnormality. Support apparatus as described above. Electronically Signed   By: Ree Molt M.D.   On: 08/16/2023 14:52     Medications:     Current Medications:  [START ON 08/18/2023] acetaminophen   650 mg Oral QID   aspirin  EC  325 mg Oral Daily   Or   aspirin   324 mg Per Tube Daily   bisacodyl   10 mg Oral Daily   Or   bisacodyl   10 mg Rectal Daily   Chlorhexidine  Gluconate Cloth  6 each Topical Daily   docusate sodium   200 mg Oral Daily   ezetimibe   10 mg Oral Daily   furosemide   20 mg Intravenous BID   insulin  aspart  0-15 Units Subcutaneous TID WC   insulin  glargine-yfgn  5 Units Subcutaneous Daily   methocarbamol  (ROBAXIN ) injection  500 mg Intravenous Q8H   metoCLOPramide  (REGLAN ) injection  10 mg Intravenous Q6H   metoprolol  tartrate  12.5 mg Oral BID   Or   metoprolol  tartrate  12.5 mg Per Tube BID   mouth rinse  15 mL Mouth Rinse Q2H   [START ON 08/18/2023] pantoprazole   40 mg Oral Daily   pantoprazole  (PROTONIX ) IV  40 mg Intravenous QHS   rosuvastatin   40 mg Oral Daily   sodium chloride  flush  3 mL Intravenous Q12H    Infusions:  sodium chloride  Stopped (08/17/23 1232)   acetaminophen       ceFAZolin  (ANCEF ) IV 2 g (08/17/23 1308)   dexmedetomidine  (PRECEDEX ) IV infusion Stopped (08/16/23 1740)   epinephrine  Stopped (08/16/23 2007)   insulin  0.5 Units/hr (08/17/23 1200)   milrinone  0.125 mcg/kg/min (08/17/23 1200)   nitroGLYCERIN  Stopped (08/16/23 1430)   norepinephrine   (LEVOPHED ) Adult infusion 4 mcg/min (08/17/23 1317)      Patient Profile   58 y/o female w/ CAD s/p multiple MIs and PCIs in the past. H/o systolic heart failure w/ improved EF. EF previously as low as 25% in the past, ultimately got ICD and EF normalized on echo 12/24 (55-60%),  h/o mitral regurgitation s/p previous minimally invasive MVR via right thoracotomy by Dr. Dusty in 2018.   She presented initially to [facility] in [place] for unstable angina and ruled in for NSTEMI. While in the ED, her ICD appropriately shocked her for VF. Cath showed severe 3V CAD. She was transferred to [facility] for CABG evaluation.  Echo done here at [facility] showed normal LVEF 55-60%, RV normal. Mitral valve ok. Mild MR and mild MS (mG 4 mmHg).   Underwent CABG x 3 (LIMA-LAD, SVG-OM1, SVG-PDA). Slow to wean off pressors/inotropes. AHF team consulted to assist w/ further management.   Assessment/Plan   1. Post Cardiotomy Shock  - c/w pressor and inotropic requirements, on NE 6 + Milrinone  0.25, CI 2.02, Co-ox 63% - PAP 25/12, CVP 9. PAPi 1.4. Gentle diuresis,  plan 20 mg IV Lasix  x 1 and follow response  - Milrinone  weaned down to 0.125 by primary team. Will plan to repeat co-ox this afternoon and will continue to follow CI off swan - if not tolerating milrinone  wean, will need 2D echo to reassess LV/RV (normal pre-op , intra-op TEE report not available)   2. MVCAD - NSTEMI this admit - s/p CABG x 3 (LIMA-LAD, SVG OM1, SVG-PDA)  - continue ASA and statin   3. VF - shocked by ICD while admitted at [facility], suspect ischemically mediated from obstructive CAD - monitor on tele  - keep K > 4.0 and Mg > 2.0   4. H/o Mitral Valve Disease - s/p minimally invasive MV repair, via rt thoracotomy in 2018 - stable valve ring on pre-CABG TEE, mild MR and mild AS   CRITICAL CARE Performed by: Caffie Shed   Total critical care time: 15 minutes  Critical care time was exclusive of separately billable  procedures and treating other patients.  Critical care was necessary to treat or prevent imminent or life-threatening deterioration.  Critical care was time spent personally by me on the following activities: development of treatment plan with patient and/or surrogate as well as nursing, discussions with consultants, evaluation of patient's response to treatment, examination of patient, obtaining history from patient or surrogate, ordering and performing treatments and interventions, ordering and review of laboratory studies, ordering and review of radiographic studies, pulse oximetry and re-evaluation of patient's condition.    Length of Stay: [address] PA-C  08/17/2023, 2:00 PM    Advanced Heart Failure Team Pager [phone] (M-F; 7a - 5p)  Please contact [facility] for night-coverage after hours (4p -7a ) and weekends on [email or web address]

## 2023-08-17 NOTE — Progress Notes (Signed)
 Patient ID: Manuelita LITTIE Molt, female   DOB: 1965/06/01, 58 y.o.   MRN: [id number]  TCTS Evening Rounds:  Hemodynamically stable on milrinone  0.125, NE 6.  CI 2.39  UO good.   CT output low.  PCO2 55 this evening. 52 this am. Continue attention to pulmonary toilet.  BMET    Component Value Date/Time   NA 138 08/17/2023 1630   NA 140 07/10/2023 1527   NA 138 07/05/2011 1145   K 4.0 08/17/2023 1630   K 3.8 07/05/2011 1145   CL 101 08/17/2023 1601   CL 109 (H) 07/05/2011 1145   CO2 26 08/17/2023 1601   CO2 25 07/05/2011 1145   GLUCOSE 121 (H) 08/17/2023 1601   GLUCOSE 81 07/05/2011 1145   BUN 10 08/17/2023 1601   BUN 11 07/10/2023 1527   BUN 15 07/05/2011 1145   CREATININE 0.91 08/17/2023 1601   CREATININE 0.85 10/27/2015 1430   CALCIUM  8.2 (L) 08/17/2023 1601   CALCIUM  8.4 (L) 07/05/2011 1145   EGFR 83 07/10/2023 1527   GFRNONAA >60 08/17/2023 1601   GFRNONAA >60 07/05/2011 1145   CBC    Component Value Date/Time   WBC 14.6 (H) 08/17/2023 1601   RBC 2.57 (L) 08/17/2023 1601   HGB 7.8 (L) 08/17/2023 1630   HGB 14.8 07/05/2011 1145   HCT 23.0 (L) 08/17/2023 1630   HCT 44.3 07/05/2011 1145   PLT 92 (L) 08/17/2023 1601   PLT 178 07/05/2011 1145   MCV 89.9 08/17/2023 1601   MCV 91 07/05/2011 1145   MCH 30.4 08/17/2023 1601   MCHC 33.8 08/17/2023 1601   RDW 14.7 08/17/2023 1601   RDW 14.1 07/05/2011 1145   LYMPHSABS 0.6 (L) 08/10/2023 2236   MONOABS 0.1 08/10/2023 2236   EOSABS 0.0 08/10/2023 2236   BASOSABS 0.0 08/10/2023 2236

## 2023-08-17 NOTE — Progress Notes (Addendum)
 TCTS DAILY ICU PROGRESS NOTE                   [address]            [place] [address]          [phone]   1 Day Post-Op Procedure(s) (LRB): CORONARY ARTERY BYPASS GRAFTING (CABG) TIMES THREE USING LEFT INTERNAL MAMMARY ARTERY AND ENDOSCOPICALLY HARVESTED RIGHT GREATER SAPHENOUS VEIN (N/A) ECHOCARDIOGRAM, TRANSESOPHAGEAL (N/A)  Total Length of Stay:  LOS: 7 days   Subjective: Pretty sleepy, pain controlled, says she's SOB  Objective: Vital signs in last 24 hours: Temp:  [96.1 F (35.6 C)-98.2 F (36.8 C)] 98.1 F (36.7 C) (08/15 9361) Pulse Rate:  [50-90] 84 (08/15 9361) Cardiac Rhythm: A-V Sequential paced (08/15 0000) Resp:  [0-32] 11 (08/15 0638) BP: (82-153)/(43-88) 124/63 (08/14 1706) SpO2:  [83 %-100 %] 100 % (08/15 9361) Arterial Line BP: (65-166)/(39-79) 99/49 (08/15 0638) FiO2 (%):  [40 %-50 %] 40 % (08/15 0425) Weight:  [54.6 kg] 54.6 kg (08/15 0500)  Filed Weights   08/10/23 1700 08/16/23 0655 08/17/23 0500  Weight: 50.2 kg (P) 50.2 kg 54.6 kg    Weight change:    Hemodynamic parameters for last 24 hours: PAP: (0-47)/(-8-34) 24/9 CVP:  [3 mmHg-30 mmHg] 6 mmHg CO:  [1.9 L/min-3 L/min] 3 L/min CI:  [1.3 L/min/m2-2 L/min/m2] 2 L/min/m2  Intake/Output from previous day: 08/14 0701 - 08/15 0700 In: 5939.8 [I.V.:2646.6; Blood:923; NG/GT:220; IV Piggyback:2150.2] Out: 4350 [Urine:2935; Emesis/NG output:140; Blood:500; Chest Tube:775]  Intake/Output this shift: No intake/output data recorded.  Current Meds: Scheduled Meds:  acetaminophen   1,000 mg Oral Q6H   Or   acetaminophen  (TYLENOL ) oral liquid 160 mg/5 mL  1,000 mg Per Tube Q6H   aspirin  EC  325 mg Oral Daily   Or   aspirin   324 mg Per Tube Daily   bisacodyl   10 mg Oral Daily   Or   bisacodyl   10 mg Rectal Daily   Chlorhexidine  Gluconate Cloth  6 each Topical Daily   docusate sodium   200 mg Oral Daily   ezetimibe   10 mg Oral Daily   metoCLOPramide  (REGLAN ) injection  10  mg Intravenous Q6H   metoprolol  tartrate  12.5 mg Oral BID   Or   metoprolol  tartrate  12.5 mg Per Tube BID   mouth rinse  15 mL Mouth Rinse Q2H   [START ON 08/18/2023] pantoprazole   40 mg Oral Daily   pantoprazole  (PROTONIX ) IV  40 mg Intravenous QHS   rosuvastatin   40 mg Oral Daily   sodium chloride  flush  3 mL Intravenous Q12H   Continuous Infusions:  sodium chloride      sodium chloride      sodium chloride  10 mL/hr at 08/17/23 0600    ceFAZolin  (ANCEF ) IV 200 mL/hr at 08/17/23 0600   dexmedetomidine  (PRECEDEX ) IV infusion Stopped (08/16/23 1740)   epinephrine  Stopped (08/16/23 2007)   insulin  0.2 Units/hr (08/17/23 0600)   lactated ringers      lactated ringers  20 mL/hr at 08/17/23 0600   milrinone  0.25 mcg/kg/min (08/17/23 0600)   nitroGLYCERIN  Stopped (08/16/23 1430)   norepinephrine  (LEVOPHED ) Adult infusion 6 mcg/min (08/17/23 0600)   phenylephrine  (NEO-SYNEPHRINE) Adult infusion Stopped (08/16/23 1500)   PRN Meds:.sodium chloride , dextrose , metoprolol  tartrate, midazolam , morphine  injection, ondansetron  (ZOFRAN ) IV, mouth rinse, oxyCODONE , sodium chloride  flush, traMADol   General appearance: fatigued and no distress Heart: regular rate and rhythm Lungs: mildly dim in bases Abdomen: benign Extremities: no edema Wound:  dressings CDI Neuro- MAEX4 , grossly non focal  Lab Results: CBC: Recent Labs    08/16/23 1926 08/16/23 2040 08/17/23 0441 08/17/23 0442 08/17/23 0602  WBC 6.2  --  10.7*  --   --   HGB 7.9*   < > 8.1* 7.1* 7.5*  HCT 23.2*   < > 23.6* 21.0* 22.0*  PLT 55*  --  76*  --   --    < > = values in this interval not displayed.   BMET:  Recent Labs    08/16/23 1926 08/16/23 2040 08/17/23 0441 08/17/23 0442 08/17/23 0602  NA 138   < > 141 141 141  K 4.2   < > 4.3 4.3 4.4  CL 104  --  106  --   --   CO2 24  --  25  --   --   GLUCOSE 130*  --  108*  --   --   BUN 11  --  10  --   --   CREATININE 0.96  --  0.90  --   --   CALCIUM  8.7*  --  8.3*   --   --    < > = values in this interval not displayed.    CMET: Lab Results  Component Value Date   WBC 10.7 (H) 08/17/2023   HGB 7.5 (L) 08/17/2023   HCT 22.0 (L) 08/17/2023   PLT 76 (L) 08/17/2023   GLUCOSE 108 (H) 08/17/2023   CHOL 221 (H) 08/11/2023   TRIG 51 08/11/2023   HDL 49 08/11/2023   LDLCALC 162 (H) 08/11/2023   ALT 13 08/10/2023   AST 25 08/10/2023   NA 141 08/17/2023   K 4.4 08/17/2023   CL 106 08/17/2023   CREATININE 0.90 08/17/2023   BUN 10 08/17/2023   CO2 25 08/17/2023   TSH 1.700 01/21/2017   INR 1.9 (H) 08/16/2023   HGBA1C 5.1 08/16/2023      PT/INR:  Recent Labs    08/16/23 1422  LABPROT 22.6*  INR 1.9*   Radiology: [facility] Chest Port 1 View Result Date: 08/16/2023 CLINICAL DATA:  [id number] S/P CABG x 3 [id number]. EXAM: PORTABLE CHEST 1 VIEW COMPARISON:  08/15/2023. FINDINGS: Bilateral lung fields are clear. Bilateral costophrenic angles are clear. Stable cardio-mediastinal silhouette. There are surgical staples along the heart border and sternotomy wires, status post CABG (coronary artery bypass graft). There is a left sided 3-lead pacemaker. Mitral annuloplasty noted. No acute osseous abnormalities. The soft tissues are within normal limits. *Endotracheal tube noted with its tip approximately 2.5-3 cm above the carina. *Enteric tube is seen coursing below the left hemidiaphragm with its tip outside the field of view. *Probable left-sided pleural drainage catheter noted with its tip overlapping with the cardiac pacemaker battery pack. *Probable right-sided pleural drainage catheter/overlying monitoring lead along the right hemidiaphragm. *Right IJ Swan-Ganz sheath noted. *Right IJ Swan-Ganz catheter with its tip overlying the main pulmonary outflow tract. *Probable epicardial pacer wires noted. IMPRESSION: *No acute cardiopulmonary abnormality. Support apparatus as described above. Electronically Signed   By: Ree Molt M.D.   On: 08/16/2023 14:52      Assessment/Plan: S/P Procedure(s) (LRB): CORONARY ARTERY BYPASS GRAFTING (CABG) TIMES THREE USING LEFT INTERNAL MAMMARY ARTERY AND ENDOSCOPICALLY HARVESTED RIGHT GREATER SAPHENOUS VEIN (N/A) ECHOCARDIOGRAM, TRANSESOPHAGEAL (N/A) POD#1  1 afeb,hemodynamics fair CO/CI-  currently on milrinone  and levo, AV paced , S BP variable but mostly in low 100's, normal Efx preop by echo 2 O2 sats ok on  2 liters Vandiver, ABG show resp acidosis 3 good UOP almost 3 liters/24 h, weight about 4 KG>preop, will need some diuresis, likely hold for now, normal renal fxn 4 CT 775 ml- leave in place 5 CBG's well controlled, routine protocols- not a diabetic 6 expected ABLA, near transfusion threshold, H/H 7.5/22 7 thrombocytopenia- monitor , has received some platelets 8 CXR- some atx, no signif effus or vasc congestion 9 routine pulm hygiene and advance activities as able  Wayne E Gold 08/17/2023 7:16 AM  Patient seen and examined, agree with findings noted above Decrease milrinone  to 0.125, wean norepi based on BP Check co-ox as CO readings inconsistent Will dc temp pacing wires Keep CT this AM SCD for DVT prophylaxis- no enoxaparin  secondary to thrombocytopenia Will give albumin  + Lasix  this AM  Elspeth C. Kerrin, MD [facility] [phone]

## 2023-08-17 NOTE — Consult Note (Signed)
 NAME:  Lindsay Camacho, MRN:  [id number], DOB:  02-27-1965, LOS: 7 ADMISSION DATE:  08/10/2023, CONSULTATION DATE:  08/17/23 REFERRING MD:  Kerrin, CHIEF COMPLAINT:  CABG   History of Present Illness:  Lindsay Camacho is a 58 y.o. F with PMH significant for CAD with prior stenting, HL, HTN, ICD and previous MV repair, who presented with chest pain to OSH in [place] and went into Vfib while in the ED with ICD shock.   She underwent LHC which showed 3V disease and was transferred to [facility] for further evaluation.   On 8/14, she underwent CABG x3 (LIMA to LAD, saphenous vein graft to obtuse marginal and posterior descending).  Intraoperatively she was noted to have severe intrapericardial adhesions so femoral venous cannulation was performed.  The patient was transferred to the ICU post-op and extubated later in the day.    POD #1 she has refused some of her po medications and is complaining significant sternotomy discomfort. Remains on Milrinone  0.125 Norepi .  She has been somewhat somnolent at time post-intubation    Pertinent  Medical History   has a past medical history of Acute myocardial infarction of other lateral wall, initial episode of care (01/2013), Aortoiliac occlusive disease (HCC) (08/22/2016), CAD (coronary artery disease) (11/13/2011), Chest pain, atypical, muscular skelatal   (01/09/2014), COVID (02/08/2020), Dyspnea, Heart failure (HCC), HLD (hyperlipidemia), Hypertension, Intermediate coronary syndrome (HCC), LBBB (left bundle branch block), Mitral regurgitation, Nonischemic cardiomyopathy (HCC) (01/26/2017), Presence of combination internal cardiac defibrillator (ICD) and pacemaker, PVD (peripheral vascular disease) (HCC), 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   Significant Hospital Events: Including procedures, antibiotic start and stop dates in addition to other pertinent events   8/14 3v  CABG and extubation 8/15 remains on milrinone  and low dose Norepi  Interim History / Subjective:  Pt is currently splinting and quite uncomfortable, on Milrinone   0.125 Norepi Pacing wires to be removed today Received albumin  and lasix    Objective    Blood pressure 124/63, pulse 84, temperature 98.1 F (36.7 C), resp. rate 11, height 5' 1 (1.549 m), weight 54.6 kg, SpO2 100%. PAP: (11-47)/(-8-34) 24/9 CVP:  [3 mmHg-30 mmHg] 6 mmHg CO:  [1.9 L/min-3 L/min] 3 L/min CI:  [1.3 L/min/m2-2 L/min/m2] 2 L/min/m2  Vent Mode: PSV;CPAP FiO2 (%):  [40 %-50 %] 40 % Set Rate:  [4 bmp-16 bmp] 4 bmp Vt Set:  [380 mL] 380 mL PEEP:  [5 cmH20] 5 cmH20 Pressure Support:  [10 cmH20] 10 cmH20 Plateau Pressure:  [1 cmH20-20 cmH20] 1 cmH20   Intake/Output Summary (Last 24 hours) at 08/17/2023 1019 Last data filed at 08/17/2023 0800 Gross per 24 hour  Intake 4539.8 ml  Output 4330 ml  Net 209.8 ml   Filed Weights   08/10/23 1700 08/16/23 0655 08/17/23 0500  Weight: 50.2 kg (P) 50.2 kg 54.6 kg    General:  thin elderly F who appears uncomfortable but is in NAD HEENT: MM pink/moist, sclera anicteric Neuro: awake and alert, moving all extremities  CV: s1s2 rrr, no m/r/g, sternotomy dressing c/d/I  PULM:  shallow inspirations with decreased air entry in the bilateral bases  GI: soft, non-tender  Extremities: warm/dry, no edema     Resolved problem list   Assessment and Plan   3V CAD s/p CABG  LIMA to LAD, saphenous vein graft to obtuse marginal and posterior descending with significant intrapericardial adhesions  HTN HL  Expected post-op ABLA, thrombocytopenia  Hyperglycemia -extubated yesterday evening, has had some somnolence and respiratory acidosis post-extubation. However is now quite awake, mild respiratory acidosis on ABG this am, if worsens then repeat  -continue IS and mobilization as able -multi-modal pain control and change po Acetaminophen  to IV for 24hrs -Flexeril  to  Robaxin    -continue oxy and prn morphine   -CO 3-3.5, CI 2.2, Cood 63 -management per TCTS, remains on milrinone  and low dose norepi, repeat coox this afternoon -transition off Insulin  gtt -monitor Hgb and platelet counts  -received lasix  and albumin , monitor UOP currently net + 775 -SSI and check A1c -pacing wires out today -on bb, Asa, statin     Best Practice (right click and Reselect all SmartList Selections daily)   Diet/type: full liquids  and NPO DVT prophylaxis SCD Pressure ulcer(s): N/A GI prophylaxis: N/A Lines: Central line Foley:  Yes, and it is still needed Code Status:  full code Last date of multidisciplinary goals of care discussion [per primary]  Labs   CBC: Recent Labs  Lab 08/10/23 2236 08/11/23 1412 08/15/23 0407 08/16/23 0438 08/16/23 0829 08/16/23 1157 08/16/23 1202 08/16/23 1422 08/16/23 1926 08/16/23 2040 08/16/23 2235 08/17/23 0441 08/17/23 0442 08/17/23 0602  WBC 7.5   < > 7.1 7.4  --   --   --  10.3 6.2  --   --  10.7*  --   --   NEUTROABS 6.8  --   --   --   --   --   --   --   --   --   --   --   --   --   HGB 14.4   < > 14.5 16.2*   < > 8.6*   < > 11.3* 7.9* 6.8* 7.5* 8.1* 7.1* 7.5*  HCT 44.2   < > 43.4 49.1*   < > 25.2*   < > 33.2* 23.2* 20.0* 22.0* 23.6* 21.0* 22.0*  MCV 87.7   < > 87.0 88.6  --   --   --  88.5 88.9  --   --  88.7  --   --   PLT 185   < > 169 191  --  68*  --  114* 55*  --   --  76*  --   --    < > = values in this interval not displayed.    Basic Metabolic Panel: Recent Labs  Lab 08/14/23 0452 08/15/23 0407 08/16/23 0438 08/16/23 0829 08/16/23 1131 08/16/23 1202 08/16/23 1302 08/16/23 1307 08/16/23 1420 08/16/23 1926 08/16/23 2040 08/16/23 2235 08/17/23 0441 08/17/23 0442 08/17/23 0602  NA 136 137 136   < > 138 136   < > 131*   < > 138 140 140 141 141 141  K 3.8 4.1 4.4   < > 5.4* 6.1*   < > 5.3*   < > 4.2 4.0 4.0 4.3 4.3 4.4  CL 103 102 100   < > 97* 97*  --  98  --  104  --   --  106  --   --    CO2 25 27 30   --   --   --   --   --   --  24  --   --  25  --   --   GLUCOSE 88 84 83   < > 101* 99  --  155*  --  130*  --   --  108*  --   --   BUN  13 11 14    < > 9 10  --  11  --  11  --   --  10  --   --   CREATININE 0.97 0.95 0.97   < > 0.60 0.60  --  0.70  --  0.96  --   --  0.90  --   --   CALCIUM  8.6* 9.1 9.4  --   --   --   --   --   --  8.7*  --   --  8.3*  --   --   MG 1.6* 1.9 1.7  --   --   --   --   --   --  3.4*  --   --  2.1  --   --    < > = values in this interval not displayed.   GFR: Estimated Creatinine Clearance: 51.4 mL/min (by C-G formula based on SCr of 0.9 mg/dL). Recent Labs  Lab 08/16/23 0438 08/16/23 1422 08/16/23 1926 08/17/23 0441  WBC 7.4 10.3 6.2 10.7*    Liver Function Tests: Recent Labs  Lab 08/10/23 2236  AST 25  ALT 13  ALKPHOS 71  BILITOT 1.1  PROT 6.1*  ALBUMIN  3.0*   No results for input(s): LIPASE, AMYLASE in the last 168 hours. No results for input(s): AMMONIA in the last 168 hours.  ABG    Component Value Date/Time   PHART 7.311 (L) 08/17/2023 0602   PCO2ART 52.5 (H) 08/17/2023 0602   PO2ART 80 (L) 08/17/2023 0602   HCO3 26.6 08/17/2023 0602   TCO2 28 08/17/2023 0602   ACIDBASEDEF 2.0 08/16/2023 2235   O2SAT 63.2 08/17/2023 0833     Coagulation Profile: Recent Labs  Lab 08/16/23 0438 08/16/23 1422  INR 1.0 1.9*    Cardiac Enzymes: No results for input(s): CKTOTAL, CKMB, CKMBINDEX, TROPONINI in the last 168 hours.  HbA1C: Hgb A1c MFr Bld  Date/Time Value Ref Range Status  08/16/2023 04:38 AM 5.1 4.8 - 5.6 % Final    Comment:    (NOTE) Diagnosis of Diabetes The following HbA1c ranges recommended by the American Diabetes Association (ADA) may be used as an aid in the diagnosis of diabetes mellitus.  Hemoglobin             Suggested A1C NGSP%              Diagnosis  <5.7                   Non Diabetic  5.7-6.4                Pre-Diabetic  >6.4                   Diabetic  <7.0                    Glycemic control for                       adults with diabetes.    03/14/2022 05:28 AM 5.2 4.8 - 5.6 % Final    Comment:    (NOTE)         Prediabetes: 5.7 - 6.4         Diabetes: >6.4         Glycemic control for adults with diabetes: <7.0     CBG: Recent Labs  Lab 08/17/23 0124 08/17/23 0334 08/17/23 0535 08/17/23 0644 08/17/23 0848  GLUCAP 140*  128* 116* 140* 121*    Review of Systems:   Please see the history of present illness. All other systems reviewed and are negative    Past Medical History:  She,  has a past medical history of Acute myocardial infarction of other lateral wall, initial episode of care (01/2013), Aortoiliac occlusive disease (HCC) (08/22/2016), CAD (coronary artery disease) (11/13/2011), Chest pain, atypical, muscular skelatal   (01/09/2014), COVID (02/08/2020), Dyspnea, Heart failure (HCC), HLD (hyperlipidemia), Hypertension, Intermediate coronary syndrome (HCC), LBBB (left bundle branch block), Mitral regurgitation, Nonischemic cardiomyopathy (HCC) (01/26/2017), Presence of combination internal cardiac defibrillator (ICD) and pacemaker, PVD (peripheral vascular disease) (HCC), 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   Surgical History:   Past Surgical History:  Procedure Laterality Date   AORTIC ARCH ANGIOGRAPHY N/A 09/21/2021   Procedure: AORTIC ARCH ANGIOGRAPHY;  Surgeon: Darron Deatrice LABOR, MD;  Location: [facility] INVASIVE CV LAB;  Service: Cardiovascular;  Laterality: N/A;   APPENDECTOMY     BIV ICD INSERTION CRT-D N/A 01/24/2017   Procedure: BIV ICD INSERTION CRT-D;  Surgeon: Waddell Danelle ORN, MD;  Location: [facility] INVASIVE CV LAB;  Service: Cardiovascular;  Laterality: N/A;   CARDIAC CATHETERIZATION  04/14/2013   Non-obstructive disease, patent CFX stent   CARDIAC CATHETERIZATION N/A 09/21/2014   Procedure: Left Heart Cath and Coronary Angiography;  Surgeon:  Debby LABOR Sor, MD;  Location: [facility] INVASIVE CV LAB;  Service: Cardiovascular;  Laterality: N/A;   CORONARY ANGIOPLASTY WITH STENT PLACEMENT  01/17/2013   mild disease except 99% CFX, rx with  2.5 x 28 Alpine drug-eluting stent    CORONARY ARTERY BYPASS GRAFT N/A 08/16/2023   Procedure: CORONARY ARTERY BYPASS GRAFTING (CABG) TIMES THREE USING LEFT INTERNAL MAMMARY ARTERY AND ENDOSCOPICALLY HARVESTED RIGHT GREATER SAPHENOUS VEIN;  Surgeon: Kerrin Elspeth BROCKS, MD;  Location: [facility] OR;  Service: Open Heart Surgery;  Laterality: N/A;   GROIN DEBRIDEMENT Right 04/14/2013   Procedure: Emergency Evacuation of Retroperitoneal Hematoma and Repair Right External Iliac Artery Pseudoaneurysm    ;  Surgeon: Carlin FORBES Haddock, MD;  Location: [facility] OR;  Service: Vascular;  Laterality: Right;   LEFT HEART CATHETERIZATION WITH CORONARY ANGIOGRAM N/A 01/18/2013   Procedure: LEFT HEART CATHETERIZATION WITH CORONARY ANGIOGRAM;  Surgeon: Candyce GORMAN Reek, MD;  Location: [facility] CATH LAB;  Service: Cardiovascular;  Laterality: N/A;   LEFT HEART CATHETERIZATION WITH CORONARY ANGIOGRAM N/A 04/14/2013   Procedure: LEFT HEART CATHETERIZATION WITH CORONARY ANGIOGRAM;  Surgeon: Candyce GORMAN Reek, MD;  Location: [facility] CATH LAB;  Service: Cardiovascular;  Laterality: N/A;   MITRAL VALVE REPAIR Right 08/25/2016   Procedure: MINIMALLY INVASIVE MITRAL VALVE REPAIR;  Surgeon: Dusty Sudie DEL, MD;  Location: [facility] OR;  Service: Open Heart Surgery;  Laterality: Right;   MULTIPLE EXTRACTIONS WITH ALVEOLOPLASTY N/A 08/23/2016   Procedure: Extraction of tooth #'s 17, 20-23, 26-29 with alveoloplasty;  Surgeon: Cyndee Tanda FALCON, DDS;  Location: [facility] OR;  Service: Oral Surgery;  Laterality: N/A;   PERCUTANEOUS CORONARY STENT INTERVENTION (PCI-S)  01/18/2013   Procedure: PERCUTANEOUS CORONARY STENT INTERVENTION (PCI-S);  Surgeon: Candyce GORMAN Reek, MD;  Location: [facility] CATH LAB;  Service: Cardiovascular;;   RIGHT/LEFT HEART CATH AND CORONARY ANGIOGRAPHY N/A  08/21/2016   Procedure: RIGHT/LEFT HEART CATH AND CORONARY ANGIOGRAPHY;  Surgeon: Verlin Lonni BIRCH, MD;  Location: [facility] INVASIVE CV LAB;  Service: Cardiovascular;  Laterality: N/A;   TEE WITHOUT CARDIOVERSION N/A 03/06/2014   Procedure: TRANSESOPHAGEAL ECHOCARDIOGRAM (TEE);  Surgeon: Leim DEL Moose, MD;  Location: [facility] ENDOSCOPY;  Service: Cardiovascular;  Laterality: N/A;   TEE WITHOUT CARDIOVERSION N/A 08/18/2016   Procedure: TRANSESOPHAGEAL ECHOCARDIOGRAM (TEE);  Surgeon: Pietro Redell RAMAN, MD;  Location: [facility] ENDOSCOPY;  Service: Cardiovascular;  Laterality: N/A;   TEE WITHOUT CARDIOVERSION N/A 08/25/2016   Procedure: TRANSESOPHAGEAL ECHOCARDIOGRAM (TEE);  Surgeon: Dusty Sudie DEL, MD;  Location: [facility] OR;  Service: Open Heart Surgery;  Laterality: N/A;   TEE WITHOUT CARDIOVERSION N/A 08/16/2023   Procedure: ECHOCARDIOGRAM, TRANSESOPHAGEAL;  Surgeon: Kerrin Elspeth BROCKS, MD;  Location: [facility] OR;  Service: Open Heart Surgery;  Laterality: N/A;   TUBAL LIGATION     UPPER EXTREMITY INTERVENTION  09/21/2021   Procedure: UPPER EXTREMITY INTERVENTION;  Surgeon: Darron Deatrice LABOR, MD;  Location: [facility] INVASIVE CV LAB;  Service: Cardiovascular;;     Social History:   reports that she has been smoking cigarettes. She has never used smokeless tobacco. She reports that she does not currently use drugs after having used the following drugs: Marijuana. She reports that she does not drink alcohol .   Family History:  Her family history includes Bladder Cancer in her mother; CAD (age of onset: 26) in her mother; CAD (age of onset: 81) in her father; Heart attack in her father and mother; Heart disease in her father and mother; Hypertension in her mother; Lung cancer in her mother; Stroke in her father and mother.   Allergies Allergies  Allergen Reactions   Aldomet [Methyldopa] Other (See Comments)    Severe hypotension   Brilinta  [Ticagrelor ] Shortness Of Breath   Other Other (See Comments)    Nuclear Stress  Test Medication caused seizures   Coumadin  [Warfarin Sodium ] Swelling    Arm swelling   Desyrel [Trazodone] Other (See Comments)    Could not wake up from medication, comatosed   Eliquis [Apixaban] Other (See Comments)    BP got too low   Entresto  [Sacubitril -Valsartan ] Hypertension    Hypotension    Zestril  [Lisinopril ]     Significant blood pressure fluctuations    Penicillins Rash     Home Medications  Prior to Admission medications   Medication Sig Start Date End Date Taking? Authorizing Provider  amLODipine  (NORVASC ) 10 MG tablet Take 1 tablet (10 mg total) by mouth daily. 07/03/23  Yes Ladona Heinz, MD  aspirin  EC 81 MG tablet Take 81 mg by mouth daily.   Yes [provider]  atorvastatin  (LIPITOR ) 80 MG tablet Take 1 tablet (80 mg total) by mouth daily. 09/07/20  Yes Waddell Danelle ORN, MD  Bempedoic Acid  (NEXLETOL ) 180 MG TABS Take 1 tablet (180 mg total) by mouth daily. 07/10/23  Yes West, Katlyn D, NP  clopidogrel  (PLAVIX ) 75 MG tablet TAKE 1 TABLET BY MOUTH DAILY 03/09/23  Yes Jeffrie Oneil BROCKS, MD  cyclobenzaprine  (FLEXERIL ) 10 MG tablet Take 1 tablet (10 mg total) by mouth 3 (three) times daily as needed for muscle spasms. 05/19/22  Yes Vicky Charleston, PA-C  furosemide  (LASIX ) 40 MG tablet Take 1 tablet (40 mg total) by mouth daily as needed for fluid or edema (shortness of breath). 03/05/20  Yes Lesia Ozell Barter, PA-C  losartan  (COZAAR ) 100 MG tablet Take 1 tablet (100 mg total) by mouth daily. 06/11/23  Yes Jeffrie Oneil BROCKS, MD  meloxicam (MOBIC) 15 MG tablet Take 15 mg by mouth daily as needed for pain. 02/09/23  Yes [provider]  Menthol-Methyl Salicylate (SALONPAS PAIN RELIEF PATCH EX) Apply 1 patch topically daily as needed (pain).   Yes [provider]  metoprolol  succinate (TOPROL -XL) 50 MG 24 hr tablet Take 2 tablets (100 mg total) by mouth daily. 07/10/23  Yes West, Katlyn D, NP  spironolactone  (ALDACTONE ) 25 MG tablet Take 0.5 tablets (12.5 mg total) by  mouth daily. 07/10/23  Yes West, Katlyn D, NP  VENTOLIN  HFA 108 (90 Base) MCG/ACT inhaler Inhale 1-2 puffs into the lungs every 4 (four) hours as needed for shortness of breath. 05/22/23  Yes Darron Deatrice LABOR, MD     Critical care time:  40 minutes     CRITICAL CARE Performed by: Leita SAUNDERS Jahkari Maclin   Total critical care time: 40 minutes  Critical care time was exclusive of separately billable procedures and treating other patients.  Critical care was necessary to treat or prevent imminent or life-threatening deterioration.  Critical care was time spent personally by me on the following activities: development of treatment plan with patient and/or surrogate as well as nursing, discussions with consultants, evaluation of patient's response to treatment, examination of patient, obtaining history from patient or surrogate, ordering and performing treatments and interventions, ordering and review of laboratory studies, ordering and review of radiographic studies, pulse oximetry and re-evaluation of patient's condition.    Leita SAUNDERS Athalia Setterlund, PA-C [facility] See Amion for pager If no response to pager , please call [phone] until 7pm After 7:00 pm call Elink  [phone]

## 2023-08-17 NOTE — Op Note (Signed)
 Lindsay Camacho, LUPINACCI MEDICAL RECORD NO: [id number] ACCOUNT NO: [id number] DATE OF BIRTH: 1965/08/19 FACILITY: [facility] LOCATION: [facility] PHYSICIAN: Elspeth BROCKS. Kerrin, MD  Operative Report   DATE OF PROCEDURE: 08/16/2023  PREOPERATIVE DIAGNOSES: 1.  Severe three-vessel coronary artery disease. 2.  Previous mitral valve repair.  POSTOPERATIVE DIAGNOSES: 1.  Severe three-vessel coronary artery disease. 2.  Previous mitral valve repair.  PROCEDURE:   Median sternotomy, extracorporeal circulation with femoral venous cannulation,  Coronary artery bypass grafting x3  Left internal mammary artery to LAD,  Saphenous vein graft to obtuse marginal 1,  Saphenous vein graft to posterior descending,  Endoscopic vein harvest right leg.  SURGEON:  Elspeth BROCKS. Kerrin, MD.  ASSISTANT:  Lemond Cera, PA-C.    Experienced assistance was necessary for this case due to surgical complexity.  Wayne Gold independently harvested the saphenous vein endoscopically and closed the leg incisions.  He then assisted with exposure, retraction of delicate tissues, suture management, and suctioning during the anastomosis.  ANESTHESIA:  General.  FINDINGS:  Severe intrapericardial adhesions, unable to cannulate right atrium.  Femoral venous cannulation was performed.  Severe adhesions inferiorly, posteriorly, and laterally.  Good quality targets.  Good quality conduits.  CLINICAL NOTE:  Lindsay Camacho is a 58 year old woman with multiple cardiac risk factors who previously had a mitral valve repair via a right thoracotomy.  She presented with unstable anginal symptoms and while being evaluated, she experienced an ICD shock.  She was noted to be in V-fib at the time of the shock.  She had elevated troponins and ruled in for a non-ST elevation MI.  At cardiac catheterization, she was found to have severe three-vessel coronary artery disease and was referred for coronary artery bypass grafting.  The indications, risks,  benefits, and alternatives were discussed in detail with the patient.  She understood and accepted the risks and agreed to proceed.  OPERATIVE NOTE:  Lindsay Camacho was brought to the operating room on 08/16/2023.  She had induction of general anesthesia and was intubated.  She had transesophageal echocardiography performed by Dr. Lamar Needle.  Please see his separately dictated note for full details of the procedure.  There was good function of the repaired mitral valve.  There was overall preserved left ventricular systolic function.  Intravenous antibiotics were administered.  A Foley catheter was placed.  The chest, abdomen, and legs were prepped and draped in the usual sterile fashion.  A timeout was performed.  A median sternotomy was performed and the left internal mammary artery was harvested using standard technique.  The mammary artery was relatively adherent to the costal cartilages, but was a good quality vessel with excellent flow when divided distally.  Simultaneously, an incision was made in the medial aspect of the right leg near the level of the knee.  The greater saphenous vein was harvested from the upper calf to the groin endoscopically.  2000 units of heparin  were administered during the vessel harvest.  The remainder of the full heparin  dose was given prior to opening the pericardium.  A sternal retractor was placed and was gradually opened.  The pericardium was opened.  There were some adhesions anteriorly, but there were severe adhesions posterolaterally and all along the right atrium.  The right atrium could not be dissected out.  After confirming adequate anticoagulation with ACT measurement, the aorta was cannulated via concentric 2-0 Ethibond pledgeted pursestring sutures.  A 22-French venous cannula then was placed via the right femoral vein.  This was done using a  Seldinger technique.  The tract over the wire was sequentially dilated and then the venous cannula was advanced into  the right atrium with transesophageal echocardiography confirmation of position.  Cardiopulmonary bypass was initiated and flows were maintained per protocol.  The patient was cooled to 32 degrees Celsius.  The heart was dissected out and inspected for target vessels.  This was a slow and tedious dissection and while the LAD was easily visible, it took me some time to identify the posterior descending and OM vessels.  A foam pad was placed in the pericardium to insulate the heart and a temperature probe was placed in the myocardial septum and a cardioplegia cannula was placed in the ascending aorta.  The aorta was cross-clamped.  The left ventricle was emptied via the aortic root vent.  Cardiac arrest then was achieved with a combination of cold antegrade blood cardioplegia and topical iced saline.  1 liter of cardioplegia was administered.  There was a rapid diastolic arrest.  There was septal cooling to 13 degrees Celsius.  A reversed saphenous vein graft was placed end to the side to the posterior descending branch of the right coronary.  This was a 1.5-mm good quality target at the site of anastomosis, which was done right at the bifurcation of the distal right coronary.  The vein was of good quality.  The anastomosis was performed with a running 7-0 Prolene suture.  A 1.5-mm probe was passed distally.  Cardioplegia was administered down the graft and there was good flow and good hemostasis.  Next, a reversed saphenous vein graft was placed end to side to OM1.  This was a 1.5-mm target vessel.  It was superficially intramyocardial.  The vein was of good quality and was anastomosed end-to-side with a running 7-0 Prolene suture.  Again, a probe passed easily proximally and distally.  Cardioplegia was administered and there was good flow and good hemostasis.  The left internal mammary artery was brought through an incision in the pericardium.  The distal end was beveled.  It was then anastomosed end-to-side  to the LAD.  The LAD was a 2-mm good quality target.  The mammary was a 1.5-mm good quality conduit and anastomosis was performed with a running 8-0 Prolene suture.  At the completion of the anastomosis, the bulldog clamp was removed.  Rapid septal rewarming was noted.  The bulldog clamp was replaced and the mammary pedicle was tacked to the epicardial surface of the heart with 6-0 Prolene sutures.  Additional cardioplegia was administered via the vein grafts and the aortic root.  The vein grafts were cut to length.  The cardioplegia cannula was removed from the ascending aorta and  proximal vein graft anastomoses were performed with 4.5-mm punch aortotomies with running 6-0 Prolene sutures.  At the completion of the final proximal anastomosis, the patient was placed in Trendelenburg position.  Lidocaine  was administered.  The aortic root was de-aired and the aortic cross-clamp was removed.  The total cross-clamp time was 68 minutes.  The patient spontaneously resumed a paced rhythm with her internal pacemaker.  No defibrillations were necessary.  While rewarming was completed, all proximal and distal anastomoses were inspected for hemostasis.  The right atrium was inaccessible.  Epicardial pacing wires were placed on the right ventricle.  When the patient had rewarmed to a core temperature of 37 degrees Celsius, she was weaned from cardiopulmonary bypass on the first attempt.  There was competition between her internal pacer and the external pacing wires.  The pacemaker representative came to the operating room and programmed the pacemaker to 80 beats per minute and there was stable rhythm thereafter.    Post bypass transesophageal echocardiography showed global hypokinesis relative to the pre-bypass study.  Low-dose epinephrine  and milrinone  infusions were initiated and there was improvement of left ventricular  function over time.  The Swan-Ganz catheter pressure monitoring was functioning normally, but the  cardiac output would not give an accurate reading.  A test dose of protamine  was administered.  The venous cannula was removed.  Then the aortic cannula was removed.  There was good hemostasis.  Pressure was held at the venous cannulation site for 20 minutes.  The remainder of the protamine  was administered without incident.  The chest was irrigated with warm saline.  Hemostasis was achieved.  Left and right pleural spaces were drained with 28-French Blake drains and a 36-French chest tube was placed in the anterior mediastinum.  Platelets were administered for thrombocytopenia.  The patient had received 3 units of packed red blood cells on pump for anemia.  The sternum was closed with a combination of single and double heavy gauge stainless steel wires.  The pectoralis fascia and subcutaneous tissue and skin were closed in standard fashion.  All sponge, needle, and instrument counts were correct at the end of the procedure.  The patient was transported from the operating room to the surgical intensive care unit intubated and in fair condition.   PUS D: 08/16/2023 5:38:00 pm T: 08/17/2023 3:39:00 am  JOB: [id number]/ [id number]

## 2023-08-17 NOTE — Plan of Care (Signed)
  Problem: Education: Goal: Knowledge of General Education information will improve Description: Including pain rating scale, medication(s)/side effects and non-pharmacologic comfort measures Outcome: Progressing   Problem: Clinical Measurements: Goal: Ability to maintain clinical measurements within normal limits will improve Outcome: Progressing Goal: Will remain free from infection Outcome: Progressing   Problem: Coping: Goal: Level of anxiety will decrease Outcome: Progressing   Problem: Safety: Goal: Ability to remain free from injury will improve Outcome: Progressing

## 2023-08-17 NOTE — Anesthesia Postprocedure Evaluation (Signed)
 Anesthesia Post Note  Patient: MAKESHIA SEAT  Procedure(s) Performed: CORONARY ARTERY BYPASS GRAFTING (CABG) TIMES THREE USING LEFT INTERNAL MAMMARY ARTERY AND ENDOSCOPICALLY HARVESTED RIGHT GREATER SAPHENOUS VEIN (Chest) ECHOCARDIOGRAM, TRANSESOPHAGEAL     Patient location during evaluation: SICU Anesthesia Type: General Level of consciousness: sedated Pain management: pain level controlled Vital Signs Assessment: post-procedure vital signs reviewed and stable Respiratory status: patient remains intubated per anesthesia plan Cardiovascular status: stable Postop Assessment: no apparent nausea or vomiting Anesthetic complications: no   No notable events documented.  Last Vitals:  Vitals:   08/17/23 1830 08/17/23 1845  BP:    Pulse: 87 88  Resp: 11 16  Temp:    SpO2: 99% 100%    Last Pain:  Vitals:   08/17/23 1600  TempSrc:   PainSc: Asleep                 Epifanio Lamar BRAVO

## 2023-08-18 DIAGNOSIS — I214 Non-ST elevation (NSTEMI) myocardial infarction: Secondary | ICD-10-CM | POA: Diagnosis not present

## 2023-08-18 DIAGNOSIS — R579 Shock, unspecified: Secondary | ICD-10-CM | POA: Diagnosis not present

## 2023-08-18 LAB — CBC
HCT: 23.4 % — ABNORMAL LOW (ref 36.0–46.0)
Hemoglobin: 7.8 g/dL — ABNORMAL LOW (ref 12.0–15.0)
MCH: 30.4 pg (ref 26.0–34.0)
MCHC: 33.3 g/dL (ref 30.0–36.0)
MCV: 91.1 fL (ref 80.0–100.0)
Platelets: 100 K/uL — ABNORMAL LOW (ref 150–400)
RBC: 2.57 MIL/uL — ABNORMAL LOW (ref 3.87–5.11)
RDW: 14.7 % (ref 11.5–15.5)
WBC: 13.9 K/uL — ABNORMAL HIGH (ref 4.0–10.5)
nRBC: 0 % (ref 0.0–0.2)

## 2023-08-18 LAB — BASIC METABOLIC PANEL WITH GFR
Anion gap: 8 (ref 5–15)
BUN: 9 mg/dL (ref 6–20)
CO2: 26 mmol/L (ref 22–32)
Calcium: 8.2 mg/dL — ABNORMAL LOW (ref 8.9–10.3)
Chloride: 99 mmol/L (ref 98–111)
Creatinine, Ser: 1.03 mg/dL — ABNORMAL HIGH (ref 0.44–1.00)
GFR, Estimated: >60 mL/min (ref >60)
Glucose, Bld: 121 mg/dL — ABNORMAL HIGH (ref 70–99)
Potassium: 3.8 mmol/L (ref 3.5–5.1)
Sodium: 133 mmol/L — ABNORMAL LOW (ref 135–145)

## 2023-08-18 LAB — COOXEMETRY PANEL
Carboxyhemoglobin: 1.9 % — ABNORMAL HIGH (ref 0.5–1.5)
Methemoglobin: 1.3 % (ref 0.0–1.5)
O2 Saturation: 73.8 %
Total hemoglobin: <6.9 g/dL — CL (ref 12.0–16.0)

## 2023-08-18 LAB — GLUCOSE, CAPILLARY
Glucose-Capillary: 138 mg/dL — ABNORMAL HIGH (ref 70–99)
Glucose-Capillary: 111 mg/dL — ABNORMAL HIGH (ref 70–99)
Glucose-Capillary: 103 mg/dL — ABNORMAL HIGH (ref 70–99)
Glucose-Capillary: 85 mg/dL (ref 70–99)

## 2023-08-18 LAB — MAGNESIUM: Magnesium: 1.4 mg/dL — ABNORMAL LOW (ref 1.7–2.4)

## 2023-08-18 LAB — PHOSPHORUS: Phosphorus: 3 mg/dL (ref 2.5–4.6)

## 2023-08-18 MED ORDER — FE FUM-VIT C-VIT B12-FA 460-60-0.01-1 MG PO CAPS
1.0000 | ORAL_CAPSULE | Freq: Every day | ORAL | Status: DC
  Administered 2023-08-18 – 2023-08-23 (×6): 1 via ORAL
  Filled 2023-08-18 (×6): qty 1

## 2023-08-18 MED ORDER — POTASSIUM CHLORIDE CRYS ER 20 MEQ PO TBCR
20.0000 meq | EXTENDED_RELEASE_TABLET | Freq: Three times a day (TID) | ORAL | Status: AC
Start: 2023-08-18 — End: 2023-08-18
  Administered 2023-08-18 (×3): 20 meq via ORAL
  Filled 2023-08-18 (×2): qty 1

## 2023-08-18 MED ORDER — MAGNESIUM SULFATE 4 GM/100ML IV SOLN
4.0000 g | Freq: Once | INTRAVENOUS | Status: AC
  Administered 2023-08-18: 4 g via INTRAVENOUS
  Filled 2023-08-18: qty 100

## 2023-08-18 MED ORDER — ORAL CARE MOUTH RINSE: 15.0000 mL | OROMUCOSAL | Status: DC | PRN

## 2023-08-18 MED ORDER — SPIRONOLACTONE 12.5 MG HALF TABLET
12.5000 mg | ORAL_TABLET | Freq: Every day | ORAL | Status: DC
  Administered 2023-08-18: 12.5 mg via ORAL
  Filled 2023-08-18: qty 1

## 2023-08-18 MED ORDER — FENTANYL CITRATE PF 50 MCG/ML IJ SOSY
25.0000 ug | PREFILLED_SYRINGE | Freq: Once | INTRAMUSCULAR | Status: AC
  Administered 2023-08-18: 25 ug via INTRAVENOUS
  Filled 2023-08-18: qty 1

## 2023-08-18 NOTE — Progress Notes (Signed)
 2 Days Post-Op Procedure(s) (LRB): CORONARY ARTERY BYPASS GRAFTING (CABG) TIMES THREE USING LEFT INTERNAL MAMMARY ARTERY AND ENDOSCOPICALLY HARVESTED RIGHT GREATER SAPHENOUS VEIN (N/A) ECHOCARDIOGRAM, TRANSESOPHAGEAL (N/A) Subjective: Complains of pain  Objective: Vital signs in last 24 hours: Temp:  [98.2 F (36.8 C)-99.7 F (37.6 C)] 98.2 F (36.8 C) (08/16 0900) Pulse Rate:  [84-98] 90 (08/16 0900) Cardiac Rhythm: Normal sinus rhythm (08/15 2000) Resp:  [10-26] 17 (08/16 0900) BP: (83-124)/(50-104) 115/104 (08/16 0900) SpO2:  [80 %-100 %] 97 % (08/16 0900) Arterial Line BP: (87-149)/(42-78) 128/56 (08/16 0900) Weight:  [54.9 kg] 54.9 kg (08/16 0600)  Hemodynamic parameters for last 24 hours: PAP: (19-56)/(3-40) 25/9 CVP:  [0 mmHg-23 mmHg] 10 mmHg CO:  [3 L/min-3.5 L/min] 3.5 L/min CI:  [2 L/min/m2-2.4 L/min/m2] 2.4 L/min/m2  Intake/Output from previous day: 08/15 0701 - 08/16 0700 In: 1475 [I.V.:734.3; IV Piggyback:740.7] Out: 2900 [Urine:2410; Chest Tube:490] Intake/Output this shift: Total I/O In: -  Out: 100 [Urine:100]  General appearance: alert and cooperative Neurologic: intact Heart: regular rate and rhythm Lungs: clear to auscultation bilaterally Extremities: edema mild Wound: incision looks good.  Lab Results: Recent Labs    08/17/23 1601 08/17/23 1630 08/18/23 0414  WBC 14.6*  --  13.9*  HGB 7.8* 7.8* 7.8*  HCT 23.1* 23.0* 23.4*  PLT 92*  --  100*   BMET:  Recent Labs    08/17/23 1601 08/17/23 1630 08/18/23 0414  NA 135 138 133*  K 3.9 4.0 3.8  CL 101  --  99  CO2 26  --  26  GLUCOSE 121*  --  121*  BUN 10  --  9  CREATININE 0.91  --  1.03*  CALCIUM  8.2*  --  8.2*    PT/INR:  Recent Labs    08/16/23 1422  LABPROT 22.6*  INR 1.9*   ABG    Component Value Date/Time   PHART 7.316 (L) 08/17/2023 1630   HCO3 28.3 (H) 08/17/2023 1630   TCO2 30 08/17/2023 1630   ACIDBASEDEF 2.0 08/16/2023 2235   O2SAT 73.8 08/18/2023 0426   CBG  (last 3)  Recent Labs    08/17/23 1542 08/17/23 2118 08/18/23 0647  GLUCAP 120* 111* 138*    Assessment/Plan: S/P Procedure(s) (LRB): CORONARY ARTERY BYPASS GRAFTING (CABG) TIMES THREE USING LEFT INTERNAL MAMMARY ARTERY AND ENDOSCOPICALLY HARVESTED RIGHT GREATER SAPHENOUS VEIN (N/A) ECHOCARDIOGRAM, TRANSESOPHAGEAL (N/A)  POD 2 Hemodynamically stable on milrinone . DC this am. Hold off on beta blocker until BP stable off vasopressors.  DC chest tubes, arterial line and swan.  -1425 yesterday. Wt is 10 lbs over preop if accurate. Continue diuresis and KCL.  Glucose under good control.  IS, OOB, mobilize.     LOS: 8 days    Lindsay Camacho 08/18/2023

## 2023-08-18 NOTE — Progress Notes (Signed)
 08/18/2023 Seen briefly, in good spirits, no new pulm issues. Most of care per TCTS and AHF.  Will follow peripherally through weekend.  Rolan Sharps MD PCCM

## 2023-08-18 NOTE — Progress Notes (Signed)
 Patient ID: Lindsay Camacho, female   DOB: 11-15-1965, 58 y.o.   MRN: [id number]  TCTS Evening Rounds:  Hemodynamically stable.  Diuresing today.   Tubes out.

## 2023-08-18 NOTE — Progress Notes (Signed)
 Advanced Heart Failure Rounding Note   Subjective:    POD #2 CABG x 3  Co-ox 74% this am on milrinone  0.125. Milrinone  stopped  PAP: (17-56)/(3-40) 25/13 CVP:  [0 mmHg-23 mmHg] 13 mmHg PCWP:  [11 mmHg] 11 mmHg CO:  [3 L/min-3.5 L/min] 3 L/min CI:  [2 L/min/m2-2.4 L/min/m2] 2.1 L/min/m2  Off all pressors. CVP 12.   Feels weak. Tired. Chest sore  Got lasix  20 IV this am. 1L out so far     Objective:   Weight Range:  Vital Signs:   Temp:  [97.3 F (36.3 C)-99.7 F (37.6 C)] 97.9 F (36.6 C) (08/16 1000) Pulse Rate:  [84-103] 84 (08/16 1000) Resp:  [10-36] 23 (08/16 1000) BP: (83-124)/(50-104) 95/56 (08/16 1000) SpO2:  [80 %-100 %] 94 % (08/16 1000) Arterial Line BP: (87-149)/(42-78) 107/48 (08/16 0945) Weight:  [54.9 kg] 54.9 kg (08/16 0600) Last BM Date :  (PTA)  Weight change: Filed Weights   08/16/23 0655 08/17/23 0500 08/18/23 0600  Weight: (P) 50.2 kg 54.6 kg 54.9 kg    Intake/Output:   Intake/Output Summary (Last 24 hours) at 08/18/2023 1007 Last data filed at 08/18/2023 0900 Gross per 24 hour  Intake 1252.95 ml  Output 2175 ml  Net -922.05 ml     Physical Exam: General:  Weak appearing. No resp difficulty HEENT: normal Neck: supple. RIJ swan Carotids 2+ bilat; no bruits. No lymphadenopathy or thryomegaly appreciated. Cor: sternal wound ok. CTs ok. Regular rate & rhythm. No rubs, gallops or murmurs. Lungs: decreased throughout Abdomen: soft, nontender, nondistended. Hypoactive  bowel sounds. Extremities: no cyanosis, clubbing, rash, edema Neuro: alert & orientedx3, cranial nerves grossly intact. moves all 4 extremities w/o difficulty. Affect pleasant  Telemetry: Sinus 80s  Personally reviewed  Labs: Basic Metabolic Panel: Recent Labs  Lab 08/16/23 0438 08/16/23 0829 08/16/23 1307 08/16/23 1420 08/16/23 1926 08/16/23 2040 08/17/23 0441 08/17/23 0442 08/17/23 0602 08/17/23 1539 08/17/23 1601 08/17/23 1630 08/18/23 0414  NA 136    < > 131*   < > 138   < > 141 141 141  --  135 138 133*  K 4.4   < > 5.3*   < > 4.2   < > 4.3 4.3 4.4  --  3.9 4.0 3.8  CL 100   < > 98  --  104  --  106  --   --   --  101  --  99  CO2 30  --   --   --  24  --  25  --   --   --  26  --  26  GLUCOSE 83   < > 155*  --  130*  --  108*  --   --   --  121*  --  121*  BUN 14   < > 11  --  11  --  10  --   --   --  10  --  9  CREATININE 0.97   < > 0.70  --  0.96  --  0.90  --   --   --  0.91  --  1.03*  CALCIUM  9.4  --   --   --  8.7*  --  8.3*  --   --   --  8.2*  --  8.2*  MG 1.7  --   --   --  3.4*  --  2.1  --   --  1.6*  --   --  1.4*  PHOS  --   --   --   --   --   --   --   --   --   --   --   --  3.0   < > = values in this interval not displayed.    Liver Function Tests: No results for input(s): AST, ALT, ALKPHOS, BILITOT, PROT, ALBUMIN  in the last 168 hours. No results for input(s): LIPASE, AMYLASE in the last 168 hours. No results for input(s): AMMONIA in the last 168 hours.  CBC: Recent Labs  Lab 08/16/23 1422 08/16/23 1926 08/16/23 2040 08/17/23 0441 08/17/23 0442 08/17/23 0602 08/17/23 1601 08/17/23 1630 08/18/23 0414  WBC 10.3 6.2  --  10.7*  --   --  14.6*  --  13.9*  HGB 11.3* 7.9*   < > 8.1* 7.1* 7.5* 7.8* 7.8* 7.8*  HCT 33.2* 23.2*   < > 23.6* 21.0* 22.0* 23.1* 23.0* 23.4*  MCV 88.5 88.9  --  88.7  --   --  89.9  --  91.1  PLT 114* 55*  --  76*  --   --  92*  --  100*   < > = values in this interval not displayed.    Cardiac Enzymes: No results for input(s): CKTOTAL, CKMB, CKMBINDEX, TROPONINI in the last 168 hours.  BNP: BNP (last 3 results) No results for input(s): BNP in the last 8760 hours.  ProBNP (last 3 results) No results for input(s): PROBNP in the last 8760 hours.    Other results:  Imaging: DG Chest Port 1 View Result Date: 08/17/2023 CLINICAL DATA:  Status post coronary artery bypass graft. EXAM: PORTABLE CHEST 1 VIEW COMPARISON:  August 16, 2023. FINDINGS:  Stable cardiomegaly. Status post cardiac valve repair. Left-sided defibrillator is unchanged. Right internal jugular Swan-Ganz catheter is noted with tip in expected position of main pulmonary artery. Mild bibasilar subsegmental atelectasis is noted. Bilateral chest tubes are noted without definite pneumothorax. Bony thorax is unremarkable. IMPRESSION: Mild bibasilar subsegmental atelectasis. Right-sided chest tube without definite pneumothorax. Electronically Signed   By: Lynwood Landy Raddle M.D.   On: 08/17/2023 08:28   DG Chest Port 1 View Result Date: 08/16/2023 CLINICAL DATA:  [id number] S/P CABG x 3 [id number]. EXAM: PORTABLE CHEST 1 VIEW COMPARISON:  08/15/2023. FINDINGS: Bilateral lung fields are clear. Bilateral costophrenic angles are clear. Stable cardio-mediastinal silhouette. There are surgical staples along the heart border and sternotomy wires, status post CABG (coronary artery bypass graft). There is a left sided 3-lead pacemaker. Mitral annuloplasty noted. No acute osseous abnormalities. The soft tissues are within normal limits. *Endotracheal tube noted with its tip approximately 2.5-3 cm above the carina. *Enteric tube is seen coursing below the left hemidiaphragm with its tip outside the field of view. *Probable left-sided pleural drainage catheter noted with its tip overlapping with the cardiac pacemaker battery pack. *Probable right-sided pleural drainage catheter/overlying monitoring lead along the right hemidiaphragm. *Right IJ Swan-Ganz sheath noted. *Right IJ Swan-Ganz catheter with its tip overlying the main pulmonary outflow tract. *Probable epicardial pacer wires noted. IMPRESSION: *No acute cardiopulmonary abnormality. Support apparatus as described above. Electronically Signed   By: Ree Molt M.D.   On: 08/16/2023 14:52     Medications:     Scheduled Medications:  acetaminophen   650 mg Oral QID   aspirin  EC  325 mg Oral Daily   Or   aspirin   324 mg Per Tube Daily   bisacodyl    10 mg Oral Daily  Or   bisacodyl   10 mg Rectal Daily   Chlorhexidine  Gluconate Cloth  6 each Topical Daily   docusate sodium   200 mg Oral Daily   ezetimibe   10 mg Oral Daily   Fe Fum-Vit C-Vit B12-FA  1 capsule Oral QPC breakfast   fentaNYL  (SUBLIMAZE ) injection  25 mcg Intravenous Once   furosemide   20 mg Intravenous BID   insulin  aspart  0-15 Units Subcutaneous TID WC   insulin  glargine-yfgn  5 Units Subcutaneous Daily   lidocaine   1 patch Transdermal Q24H   mouth rinse  15 mL Mouth Rinse Q2H   pantoprazole   40 mg Oral Daily   potassium chloride   20 mEq Oral TID   rosuvastatin   40 mg Oral Daily   sodium chloride  flush  3 mL Intravenous Q12H    Infusions:  magnesium  sulfate bolus IVPB 4 g (08/18/23 0823)    PRN Medications: albuterol , morphine  injection, ondansetron  (ZOFRAN ) IV, mouth rinse, mouth rinse, oxyCODONE , sodium chloride  flush, traMADol    Assessment/Plan:   1. Post Cardiotomy Shock  - pre-op  echo 8/10 EF 55-60% - milrinone  stopped this am. - Thermo CO only 3L but co-ox ok - Milrinone  stopped this am. - CVP 12 - Suspect component of RV dysfunction - will diurese. 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 Will need Plavix  prior to admit - needs ambulation and IS. (She is weak. Could only do 250 on IS. I educated her more on this)    3. 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 H/o Mitral Valve Disease - s/p minimally invasive MV repair, via rt thoracotomy in 2018 - stable valve ring on pre-CABG TEE, mild MR and mild AS   5. Hypomag/hypokalemia - supp  CRITICAL CARE Performed by: Isyss Espinal  Total critical care time: 40 minutes  Critical care time was exclusive of separately billable  procedures and treating other patients.  Critical care was necessary to treat or prevent imminent or life-threatening deterioration.  Critical care was time spent personally by me (independent of midlevel providers or residents) on the following activities: development of treatment plan with patient and/or surrogate as well as nursing, discussions with consultants, evaluation of patient's response to treatment, examination of patient, obtaining history from patient or surrogate, ordering and performing treatments and interventions, ordering and review of laboratory studies, ordering and review of radiographic studies, pulse oximetry and re-evaluation of patient's condition.  Length of Stay: 8   Toribio Fuel MD 08/18/2023, 10:07 AM  Advanced Heart Failure Team Pager [phone] (M-F; 7a - 4p)  Please contact [facility] for night-coverage after hours (4p -7a ) and weekends on [email or web address]

## 2023-08-19 ENCOUNTER — Inpatient Hospital Stay (HOSPITAL_COMMUNITY)

## 2023-08-19 DIAGNOSIS — I214 Non-ST elevation (NSTEMI) myocardial infarction: Secondary | ICD-10-CM | POA: Diagnosis not present

## 2023-08-19 LAB — TYPE AND SCREEN
ABO/RH(D): A POS
Antibody Screen: NEGATIVE
Unit division: 0
Unit division: 0
Unit division: 0
Unit division: 0

## 2023-08-19 LAB — BASIC METABOLIC PANEL WITH GFR
Anion gap: 9 (ref 5–15)
BUN: 10 mg/dL (ref 6–20)
CO2: 29 mmol/L (ref 22–32)
Calcium: 8.3 mg/dL — ABNORMAL LOW (ref 8.9–10.3)
Chloride: 98 mmol/L (ref 98–111)
Creatinine, Ser: 0.86 mg/dL (ref 0.44–1.00)
GFR, Estimated: >60 mL/min (ref >60)
Glucose, Bld: 81 mg/dL (ref 70–99)
Potassium: 4 mmol/L (ref 3.5–5.1)
Sodium: 136 mmol/L (ref 135–145)

## 2023-08-19 LAB — MAGNESIUM: Magnesium: 1.8 mg/dL (ref 1.7–2.4)

## 2023-08-19 LAB — CBC
HCT: 23.1 % — ABNORMAL LOW (ref 36.0–46.0)
Hemoglobin: 7.5 g/dL — ABNORMAL LOW (ref 12.0–15.0)
MCH: 30.2 pg (ref 26.0–34.0)
MCHC: 32.5 g/dL (ref 30.0–36.0)
MCV: 93.1 fL (ref 80.0–100.0)
Platelets: 76 K/uL — ABNORMAL LOW (ref 150–400)
RBC: 2.48 MIL/uL — ABNORMAL LOW (ref 3.87–5.11)
RDW: 14.8 % (ref 11.5–15.5)
WBC: 7 K/uL (ref 4.0–10.5)
nRBC: 0 % (ref 0.0–0.2)

## 2023-08-19 LAB — BPAM RBC
Blood Product Expiration Date: 202508312359
Blood Product Expiration Date: 202508312359
Blood Product Expiration Date: 202509022359
Blood Product Expiration Date: 202509132359
ISSUE DATE / TIME: 202508141103
ISSUE DATE / TIME: 202508141103
ISSUE DATE / TIME: 202508141103
ISSUE DATE / TIME: 202508141103
Unit Type and Rh: 6200
Unit Type and Rh: 6200
Unit Type and Rh: 6200
Unit Type and Rh: 6200

## 2023-08-19 LAB — COOXEMETRY PANEL
Carboxyhemoglobin: 1.6 % — ABNORMAL HIGH (ref 0.5–1.5)
Methemoglobin: 0.8 % (ref 0.0–1.5)
O2 Saturation: 63.6 %
Total hemoglobin: 8 g/dL — ABNORMAL LOW (ref 12.0–16.0)

## 2023-08-19 LAB — ECHO INTRAOPERATIVE TEE
AV Peak grad: 7.4 mmHg
Ao pk vel: 1.36 m/s
Height: 61 in
S' Lateral: 1.2 cm
Weight: 1770.73 [oz_av]

## 2023-08-19 LAB — GLUCOSE, CAPILLARY: Glucose-Capillary: 108 mg/dL — ABNORMAL HIGH (ref 70–99)

## 2023-08-19 MED ORDER — MAGNESIUM SULFATE 2 GM/50ML IV SOLN
2.0000 g | Freq: Once | INTRAVENOUS | Status: AC
  Administered 2023-08-19: 2 g via INTRAVENOUS
  Filled 2023-08-19: qty 50

## 2023-08-19 MED ORDER — SPIRONOLACTONE 25 MG PO TABS
25.0000 mg | ORAL_TABLET | Freq: Every day | ORAL | Status: DC
  Administered 2023-08-19 – 2023-08-21 (×3): 25 mg via ORAL
  Filled 2023-08-19 (×3): qty 1

## 2023-08-19 MED ORDER — AMIODARONE HCL IN DEXTROSE 360-4.14 MG/200ML-% IV SOLN
30.0000 mg/h | INTRAVENOUS | Status: DC
  Administered 2023-08-20: 30 mg/h via INTRAVENOUS
  Filled 2023-08-19: qty 200

## 2023-08-19 MED ORDER — POLYETHYLENE GLYCOL 3350 17 G PO PACK
17.0000 g | PACK | Freq: Two times a day (BID) | ORAL | Status: DC
  Administered 2023-08-19 – 2023-08-21 (×4): 17 g via ORAL
  Filled 2023-08-19 (×7): qty 1

## 2023-08-19 MED ORDER — DIGOXIN 125 MCG PO TABS
0.1250 mg | ORAL_TABLET | Freq: Every day | ORAL | Status: DC
  Administered 2023-08-19 – 2023-08-20 (×2): 0.125 mg via ORAL
  Filled 2023-08-19 (×2): qty 1

## 2023-08-19 MED ORDER — OXYCODONE HCL 5 MG PO TABS
5.0000 mg | ORAL_TABLET | ORAL | Status: DC | PRN
  Administered 2023-08-19 – 2023-08-23 (×14): 5 mg via ORAL
  Filled 2023-08-19 (×14): qty 1

## 2023-08-19 MED ORDER — AMIODARONE HCL IN DEXTROSE 360-4.14 MG/200ML-% IV SOLN
60.0000 mg/h | INTRAVENOUS | Status: AC
  Administered 2023-08-19 (×2): 60 mg/h via INTRAVENOUS
  Filled 2023-08-19 (×2): qty 200

## 2023-08-19 NOTE — Progress Notes (Signed)
 Advanced Heart Failure Rounding Note   Subjective:    POD #3 CABG x 3  Co-ox 64 off milrinone , can remove introducer  CVP:  [7 mmHg-14 mmHg] 10 mmHg  CVP 8-10, holding IV diuresis today. She is extremely weak and unmotivated. Discussed the need for ambulation as she is otherwise improving from a cardiac standpoint.    Objective:   Weight Range:  Vital Signs:   Temp:  [97.8 F (36.6 C)-100 F (37.8 C)] 97.9 F (36.6 C) (08/17 1134) Pulse Rate:  [83-118] 86 (08/17 1200) Resp:  [12-25] 24 (08/17 1200) BP: (89-124)/(39-80) 106/72 (08/17 1200) SpO2:  [82 %-100 %] 98 % (08/17 1200) Weight:  [48.9 kg] 48.9 kg (08/17 0500) Last BM Date :  (PTA)  Weight change: Filed Weights   08/17/23 0500 08/18/23 0600 08/19/23 0500  Weight: 54.6 kg 54.9 kg 48.9 kg    Intake/Output:   Intake/Output Summary (Last 24 hours) at 08/19/2023 1355 Last data filed at 08/19/2023 1200 Gross per 24 hour  Intake 51.25 ml  Output 1920 ml  Net -1868.75 ml     Physical Exam: GENERAL: NAD, weak appearing PULM:  Normal work of breathing, CTAB CARDIAC:  JVP: 8         Normal rate with regular rhythm. Systolic murmur,  No edema. Warm and well perfused extremities. ABDOMEN: Soft, non-tender, non-distended. NEUROLOGIC: Patient is oriented x3 with no focal or lateralizing neurologic deficits.    Telemetry: Sinus 80s  Personally reviewed  Labs: Basic Metabolic Panel: Recent Labs  Lab 08/16/23 1926 08/16/23 2040 08/17/23 0441 08/17/23 0442 08/17/23 0602 08/17/23 1539 08/17/23 1601 08/17/23 1630 08/18/23 0414 08/19/23 0419 08/19/23 0921  NA 138   < > 141   < > 141  --  135 138 133* 136  --   K 4.2   < > 4.3   < > 4.4  --  3.9 4.0 3.8 4.0  --   CL 104  --  106  --   --   --  101  --  99 98  --   CO2 24  --  25  --   --   --  26  --  26 29  --   GLUCOSE 130*  --  108*  --   --   --  121*  --  121* 81  --   BUN 11  --  10  --   --   --  10  --  9 10  --   CREATININE 0.96  --  0.90  --    --   --  0.91  --  1.03* 0.86  --   CALCIUM  8.7*  --  8.3*  --   --   --  8.2*  --  8.2* 8.3*  --   MG 3.4*  --  2.1  --   --  1.6*  --   --  1.4*  --  1.8  PHOS  --   --   --   --   --   --   --   --  3.0  --   --    < > = values in this interval not displayed.    Liver Function Tests: No results for input(s): AST, ALT, ALKPHOS, BILITOT, PROT, ALBUMIN  in the last 168 hours. No results for input(s): LIPASE, AMYLASE in the last 168 hours. No results for input(s): AMMONIA in the last 168 hours.  CBC: Recent Labs  Lab 08/16/23 1926 08/16/23 2040 08/17/23 0441 08/17/23 0442 08/17/23 0602 08/17/23 1601 08/17/23 1630 08/18/23 0414 08/19/23 0419  WBC 6.2  --  10.7*  --   --  14.6*  --  13.9* 7.0  HGB 7.9*   < > 8.1*   < > 7.5* 7.8* 7.8* 7.8* 7.5*  HCT 23.2*   < > 23.6*   < > 22.0* 23.1* 23.0* 23.4* 23.1*  MCV 88.9  --  88.7  --   --  89.9  --  91.1 93.1  PLT 55*  --  76*  --   --  92*  --  100* 76*   < > = values in this interval not displayed.    Medications:     Scheduled Medications:  acetaminophen   650 mg Oral QID   aspirin  EC  325 mg Oral Daily   Or   aspirin   324 mg Per Tube Daily   bisacodyl   10 mg Oral Daily   Or   bisacodyl   10 mg Rectal Daily   Chlorhexidine  Gluconate Cloth  6 each Topical Daily   digoxin   0.125 mg Oral Daily   docusate sodium   200 mg Oral Daily   ezetimibe   10 mg Oral Daily   Fe Fum-Vit C-Vit B12-FA  1 capsule Oral QPC breakfast   lidocaine   1 patch Transdermal Q24H   pantoprazole   40 mg Oral Daily   polyethylene glycol  17 g Oral BID   rosuvastatin   40 mg Oral Daily   sodium chloride  flush  3 mL Intravenous Q12H   spironolactone   25 mg Oral Daily    Infusions:  amiodarone  60 mg/hr (08/19/23 1200)   amiodarone      magnesium  sulfate bolus IVPB      PRN Medications: albuterol , ondansetron  (ZOFRAN ) IV, mouth rinse, oxyCODONE , sodium chloride  flush, traMADol    Assessment/Plan:   Post Cardiotomy Shock  - pre-op   echo 8/10 EF 55-60%, TEE post op showing moderately reduced EF, mildly reduced RV function - Off milrinone  8/16, remove introducer - CVP 8-10, hold diuresis today - Continue spironolactone  - Start oral diruetics tomorrow   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 Will need Plavix  prior to admit - Extremely weak, this will be a barrier to discharge. High risk for pulmonary complications. Discussed at length, needs to get out of bed  Atrial fibrillation: Noted after removal of introducer. - IV amiodarone  gtt, no bolus - Hold OAC   VF - shocked by ICD while admitted at [facility], suspect ischemically mediated from obstructive CAD - monitor on tele  - keep K > 4.0 and Mg > 2.0  - rhythm stable   H/o Mitral Valve Disease - s/p minimally invasive MV repair, via rt thoracotomy in 2018 - stable valve ring on pre-CABG TEE, mild MR and mild AS   Hypomag/hypokalemia - supp   Length of Stay: 9   Morene JINNY Brownie MD 08/19/2023, 1:55 PM  Advanced Heart Failure Team Pager [phone] (M-F; 7a - 4p)

## 2023-08-19 NOTE — Progress Notes (Signed)
 08/19/2023 Discussed with TCTS and AHF and reviewed CXR.  This is a mobility and motivation issue, really needs to work on this.  RLL may be down on CXR: again needs mobility. PCCM will see again formally tomorrow.  Rolan Sharps MD PCCM

## 2023-08-19 NOTE — Progress Notes (Signed)
 3 Days Post-Op Procedure(s) (LRB): CORONARY ARTERY BYPASS GRAFTING (CABG) TIMES THREE USING LEFT INTERNAL MAMMARY ARTERY AND ENDOSCOPICALLY HARVESTED RIGHT GREATER SAPHENOUS VEIN (N/A) ECHOCARDIOGRAM, TRANSESOPHAGEAL (N/A) Subjective:  Says she feels rough but nothing specific.   Objective: Vital signs in last 24 hours: Temp:  [97.3 F (36.3 C)-100 F (37.8 C)] 97.8 F (36.6 C) (08/17 0735) Pulse Rate:  [83-103] 84 (08/17 0800) Cardiac Rhythm: A-V Sequential paced (08/17 0800) Resp:  [10-36] 15 (08/17 0800) BP: (89-124)/(39-104) 99/59 (08/17 0800) SpO2:  [91 %-100 %] 100 % (08/17 0800) Arterial Line BP: (107-141)/(48-61) 107/48 (08/16 0945) Weight:  [48.9 kg] 48.9 kg (08/17 0500)  Hemodynamic parameters for last 24 hours: PAP: (17-28)/(6-13) 25/13 CVP:  [5 mmHg-14 mmHg] 10 mmHg PCWP:  [11 mmHg] 11 mmHg CO:  [3 L/min] 3 L/min CI:  [2.1 L/min/m2] 2.1 L/min/m2  Intake/Output from previous day: 08/16 0701 - 08/17 0700 In: 376.7 [P.O.:120; I.V.:56.7; IV Piggyback:200.1] Out: 3055 [Urine:2955; Chest Tube:100] Intake/Output this shift: No intake/output data recorded.  General appearance: alert and cooperative Neurologic: intact Heart: regular rate and rhythm Lungs: clear to auscultation bilaterally Abdomen: soft, non-tender; bowel sounds normal Extremities: no edema Wound: incision healing well  Lab Results: Recent Labs    08/18/23 0414 08/19/23 0419  WBC 13.9* 7.0  HGB 7.8* 7.5*  HCT 23.4* 23.1*  PLT 100* 76*   BMET:  Recent Labs    08/18/23 0414 08/19/23 0419  NA 133* 136  K 3.8 4.0  CL 99 98  CO2 26 29  GLUCOSE 121* 81  BUN 9 10  CREATININE 1.03* 0.86  CALCIUM  8.2* 8.3*    PT/INR:  Recent Labs    08/16/23 1422  LABPROT 22.6*  INR 1.9*   ABG    Component Value Date/Time   PHART 7.316 (L) 08/17/2023 1630   HCO3 28.3 (H) 08/17/2023 1630   TCO2 30 08/17/2023 1630   ACIDBASEDEF 2.0 08/16/2023 2235   O2SAT 63.6 08/19/2023 0419   CBG (last 3)   Recent Labs    08/18/23 1543 08/18/23 2134 08/19/23 0640  GLUCAP 103* 85 108*   CXR: mild bibasilar atelectasis  Assessment/Plan: S/P Procedure(s) (LRB): CORONARY ARTERY BYPASS GRAFTING (CABG) TIMES THREE USING LEFT INTERNAL MAMMARY ARTERY AND ENDOSCOPICALLY HARVESTED RIGHT GREATER SAPHENOUS VEIN (N/A) ECHOCARDIOGRAM, TRANSESOPHAGEAL (N/A)  POD 3 Hemodynamically stable in AV paced rhythm internally.  -2678 cc yesterday. Standing wt is below preop. Hold off on further diuresis for now.  Glucose has been under good control with no hx of DM and normal Hgb A1c. Will DC SSI.  Low grade temp to 100. DC sleeve, foley. Work on [employer], ambulation.  Expected postop blood loss anemia. Continue iron.  LOS: 9 days    Dorise MARLA Fellers 08/19/2023

## 2023-08-19 NOTE — Progress Notes (Signed)
 Patient ID: Lindsay Camacho, female   DOB: 03-19-1965, 58 y.o.   MRN: [id number]  TCTS Evening Rounds:  Hemodynamically stable in AV paced rhythm. She went into atrial fib with RVR today and started on IV amio with return to regular paced rhythm.  Ambulated small block today.  Had BM.

## 2023-08-20 ENCOUNTER — Inpatient Hospital Stay (HOSPITAL_COMMUNITY)

## 2023-08-20 DIAGNOSIS — R579 Shock, unspecified: Secondary | ICD-10-CM | POA: Diagnosis not present

## 2023-08-20 DIAGNOSIS — I2510 Atherosclerotic heart disease of native coronary artery without angina pectoris: Secondary | ICD-10-CM | POA: Diagnosis not present

## 2023-08-20 DIAGNOSIS — J90 Pleural effusion, not elsewhere classified: Secondary | ICD-10-CM | POA: Diagnosis not present

## 2023-08-20 DIAGNOSIS — F17210 Nicotine dependence, cigarettes, uncomplicated: Secondary | ICD-10-CM

## 2023-08-20 DIAGNOSIS — I214 Non-ST elevation (NSTEMI) myocardial infarction: Secondary | ICD-10-CM | POA: Diagnosis not present

## 2023-08-20 DIAGNOSIS — R570 Cardiogenic shock: Secondary | ICD-10-CM | POA: Diagnosis not present

## 2023-08-20 DIAGNOSIS — J9601 Acute respiratory failure with hypoxia: Secondary | ICD-10-CM

## 2023-08-20 DIAGNOSIS — E785 Hyperlipidemia, unspecified: Secondary | ICD-10-CM

## 2023-08-20 LAB — CBC
HCT: 27.1 % — ABNORMAL LOW (ref 36.0–46.0)
Hemoglobin: 8.8 g/dL — ABNORMAL LOW (ref 12.0–15.0)
MCH: 30.3 pg (ref 26.0–34.0)
MCHC: 32.5 g/dL (ref 30.0–36.0)
MCV: 93.4 fL (ref 80.0–100.0)
Platelets: 137 K/uL — ABNORMAL LOW (ref 150–400)
RBC: 2.9 MIL/uL — ABNORMAL LOW (ref 3.87–5.11)
RDW: 14.8 % (ref 11.5–15.5)
WBC: 10.8 K/uL — ABNORMAL HIGH (ref 4.0–10.5)
nRBC: 0 % (ref 0.0–0.2)

## 2023-08-20 LAB — BASIC METABOLIC PANEL WITH GFR
Anion gap: 10 (ref 5–15)
BUN: 16 mg/dL (ref 6–20)
CO2: 27 mmol/L (ref 22–32)
Calcium: 8.4 mg/dL — ABNORMAL LOW (ref 8.9–10.3)
Chloride: 98 mmol/L (ref 98–111)
Creatinine, Ser: 0.98 mg/dL (ref 0.44–1.00)
GFR, Estimated: >60 mL/min (ref >60)
Glucose, Bld: 107 mg/dL — ABNORMAL HIGH (ref 70–99)
Potassium: 3.7 mmol/L (ref 3.5–5.1)
Sodium: 135 mmol/L (ref 135–145)

## 2023-08-20 LAB — ECHOCARDIOGRAM LIMITED
Area-P 1/2: 3.21 cm2
Height: 61 in
S' Lateral: 2.6 cm
Weight: 1788.37 [oz_av]

## 2023-08-20 MED ORDER — HYALURONIDASE HUMAN 150 UNIT/ML IJ SOLN
150.0000 [IU] | Freq: Once | INTRAMUSCULAR | Status: AC
  Administered 2023-08-20: 150 [IU] via SUBCUTANEOUS
  Filled 2023-08-20: qty 1

## 2023-08-20 MED ORDER — ENOXAPARIN SODIUM 40 MG/0.4ML IJ SOSY
40.0000 mg | PREFILLED_SYRINGE | Freq: Every day | INTRAMUSCULAR | Status: DC
  Administered 2023-08-20 – 2023-08-22 (×3): 40 mg via SUBCUTANEOUS
  Filled 2023-08-20 (×3): qty 0.4

## 2023-08-20 MED ORDER — FUROSEMIDE 10 MG/ML IJ SOLN
40.0000 mg | Freq: Once | INTRAMUSCULAR | Status: AC
  Administered 2023-08-20: 40 mg via INTRAVENOUS
  Filled 2023-08-20: qty 4

## 2023-08-20 MED ORDER — AMIODARONE HCL 200 MG PO TABS
200.0000 mg | ORAL_TABLET | Freq: Two times a day (BID) | ORAL | Status: DC
  Administered 2023-08-20 – 2023-08-22 (×4): 200 mg via ORAL
  Filled 2023-08-20 (×4): qty 1

## 2023-08-20 MED ORDER — AMIODARONE HCL 200 MG PO TABS
400.0000 mg | ORAL_TABLET | Freq: Two times a day (BID) | ORAL | Status: DC
  Administered 2023-08-20: 400 mg via ORAL
  Filled 2023-08-20: qty 2

## 2023-08-20 MED ORDER — FUROSEMIDE 10 MG/ML IJ SOLN
INTRAMUSCULAR | Status: AC
  Filled 2023-08-20: qty 4

## 2023-08-20 MED ORDER — POTASSIUM CHLORIDE CRYS ER 20 MEQ PO TBCR
20.0000 meq | EXTENDED_RELEASE_TABLET | Freq: Once | ORAL | Status: AC
  Administered 2023-08-20: 20 meq via ORAL
  Filled 2023-08-20: qty 1

## 2023-08-20 MED ORDER — REVEFENACIN 175 MCG/3ML IN SOLN
175.0000 ug | Freq: Every day | RESPIRATORY_TRACT | Status: DC
  Administered 2023-08-20 – 2023-08-22 (×3): 175 ug via RESPIRATORY_TRACT
  Filled 2023-08-20 (×3): qty 3

## 2023-08-20 MED ORDER — ARFORMOTEROL TARTRATE 15 MCG/2ML IN NEBU
15.0000 ug | INHALATION_SOLUTION | Freq: Two times a day (BID) | RESPIRATORY_TRACT | Status: DC
  Administered 2023-08-20 – 2023-08-22 (×5): 15 ug via RESPIRATORY_TRACT
  Filled 2023-08-20 (×5): qty 2

## 2023-08-20 NOTE — Progress Notes (Addendum)
 Advanced Heart Failure Rounding Note   Subjective:    POD #4 CABG x 3  Off milrinone . Lines out.   Nearing pre-op  wt but remains volume up.   Feels tired and weak. No resting dyspnea. Ambulated w/ PT earlier today.   Objective:   Weight Range:  Vital Signs:   Temp:  [97.6 F (36.4 C)-97.9 F (36.6 C)] 97.6 F (36.4 C) (08/18 0700) Pulse Rate:  [82-118] 86 (08/18 0600) Resp:  [12-25] 12 (08/18 0600) BP: (101-154)/(60-94) 135/90 (08/18 0600) SpO2:  [72 %-100 %] 97 % (08/18 0600) Weight:  [50.7 kg] 50.7 kg (08/18 0500) Last BM Date : 08/19/23  Weight change: Filed Weights   08/18/23 0600 08/19/23 0500 08/20/23 0500  Weight: 54.9 kg 48.9 kg 50.7 kg    Intake/Output:   Intake/Output Summary (Last 24 hours) at 08/20/2023 0940 Last data filed at 08/20/2023 0600 Gross per 24 hour  Intake 472.08 ml  Output 205 ml  Net 267.08 ml    GENERAL: fatigued appearing, NAD Lungs- decrease BS at the bases  CARDIAC:  JVP: 8 cm          Normal rate with regular rhythm. No  murmur. Sternal wound ok ABDOMEN: Soft, non-tender, non-distended.  EXTREMITIES: Warm and well perfused.  NEUROLOGIC: No obvious FND   Telemetry: A-V paced 80s Personally reviewed  Labs: Basic Metabolic Panel: Recent Labs  Lab 08/16/23 1926 08/16/23 2040 08/17/23 0441 08/17/23 0442 08/17/23 1539 08/17/23 1601 08/17/23 1630 08/18/23 0414 08/19/23 0419 08/19/23 0921 08/20/23 0319  NA 138   < > 141   < >  --  135 138 133* 136  --  135  K 4.2   < > 4.3   < >  --  3.9 4.0 3.8 4.0  --  3.7  CL 104  --  106  --   --  101  --  99 98  --  98  CO2 24  --  25  --   --  26  --  26 29  --  27  GLUCOSE 130*  --  108*  --   --  121*  --  121* 81  --  107*  BUN 11  --  10  --   --  10  --  9 10  --  16  CREATININE 0.96  --  0.90  --   --  0.91  --  1.03* 0.86  --  0.98  CALCIUM  8.7*  --  8.3*  --   --  8.2*  --  8.2* 8.3*  --  8.4*  MG 3.4*  --  2.1  --  1.6*  --   --  1.4*  --  1.8  --   PHOS  --   --    --   --   --   --   --  3.0  --   --   --    < > = values in this interval not displayed.    Liver Function Tests: No results for input(s): AST, ALT, ALKPHOS, BILITOT, PROT, ALBUMIN  in the last 168 hours. No results for input(s): LIPASE, AMYLASE in the last 168 hours. No results for input(s): AMMONIA in the last 168 hours.  CBC: Recent Labs  Lab 08/17/23 0441 08/17/23 0442 08/17/23 1601 08/17/23 1630 08/18/23 0414 08/19/23 0419 08/20/23 0319  WBC 10.7*  --  14.6*  --  13.9* 7.0 10.8*  HGB 8.1*   < > 7.8*  7.8* 7.8* 7.5* 8.8*  HCT 23.6*   < > 23.1* 23.0* 23.4* 23.1* 27.1*  MCV 88.7  --  89.9  --  91.1 93.1 93.4  PLT 76*  --  92*  --  100* 76* 137*   < > = values in this interval not displayed.    Medications:     Scheduled Medications:  acetaminophen   650 mg Oral QID   amiodarone   400 mg Oral BID   arformoterol   15 mcg Nebulization BID   aspirin  EC  325 mg Oral Daily   Or   aspirin   324 mg Per Tube Daily   bisacodyl   10 mg Oral Daily   Or   bisacodyl   10 mg Rectal Daily   Chlorhexidine  Gluconate Cloth  6 each Topical Daily   digoxin   0.125 mg Oral Daily   docusate sodium   200 mg Oral Daily   enoxaparin  (LOVENOX ) injection  40 mg Subcutaneous Daily   ezetimibe   10 mg Oral Daily   Fe Fum-Vit C-Vit B12-FA  1 capsule Oral QPC breakfast   furosemide   40 mg Intravenous Once   lidocaine   1 patch Transdermal Q24H   pantoprazole   40 mg Oral Daily   polyethylene glycol  17 g Oral BID   revefenacin   175 mcg Nebulization Daily   rosuvastatin   40 mg Oral Daily   sodium chloride  flush  3 mL Intravenous Q12H   spironolactone   25 mg Oral Daily    Infusions:    PRN Medications: albuterol , ondansetron  (ZOFRAN ) IV, mouth rinse, oxyCODONE , sodium chloride  flush, traMADol    Assessment/Plan:   Post Cardiotomy Shock  - pre-op  echo 8/10 EF 55-60%, TEE post op showing moderately reduced EF, mildly reduced RV function - Off milrinone  8/16, SCr stable  -  remains volume up, diurese w/ IV Lasix  40 mg x 1 - Continue spironolactone  25 mg daily  - Continue digoxin  0.125 mg daily   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 Will need Plavix  prior to discharge - remains extremely weak, this will be a barrier to discharge. High risk for pulmonary complications. encouraged ambulation.   Atrial fibrillation: Noted after removal of introducer. - A-V paced on tele  - transition to PO amio 200 mg bid  - Hold OAC   VF - shocked by ICD while admitted at [facility], suspect ischemically mediated from obstructive CAD - monitor on tele  - keep K > 4.0 and Mg > 2.0  - rhythm stable - continue PO amio    H/o Mitral Valve Disease - s/p minimally invasive MV repair, via rt thoracotomy in 2018 - stable valve ring on pre-CABG TEE, mild MR and mild AS   Hypomag/hypokalemia - Mg 1.8, K 3.7  - supp, d/w pharmD    Length of Stay: 10   Brittainy Simmons PA-C  08/20/2023, 9:40 AM  Advanced Heart Failure Team Pager [phone] (M-F; 7a - 4p)   Patient seen and examined with the above-signed Advanced Practice Provider and/or Housestaff. I personally reviewed laboratory data, imaging studies and relevant notes. I independently examined the patient and formulated the important aspects of the plan. I have edited the note to reflect any of my changes or salient points. I have personally discussed the plan with the patient and/or family.  Off milrinone . Denies CP or SOB. Rhythm stable. Not very active.   General:  Elderly  No resp difficulty HEENT: normal Neck: supple. JVP 8.  Carotids 2+ bilat; no bruits.  Cor: Regular rate & rhythm. No rubs, gallops or murmurs. Lungs: decreased  Abdomen: soft, nontender, nondistended. No hepatosplenomegaly. No bruits or masses. Good bowel sounds. Extremities: no cyanosis, clubbing, rash, edema Neuro: alert & orientedx3, cranial nerves grossly intact. moves all 4  extremities w/o difficulty. Affect pleasant  She is recovering steadily post-op. Long discussion about need to ambulate more and use IS to focus on post-op ambulation and recovery. One dose lasix  today   Can repeat limited echo. Ideally would wean off dicgoxin  Adriyana Greenbaum, MD  11:17 AM

## 2023-08-20 NOTE — Evaluation (Signed)
 Occupational Therapy Evaluation Patient Details Name: Lindsay Camacho MRN: [id number] DOB: Jul 18, 1965 Today's Date: 08/20/2023   History of Present Illness   58 yo female s/p 8/14 CABG x3 harvested R greater saphenous vein PMH MI, heart failure, HLD, HTN, LBBB s/p PPM, PVD, TIA.     Clinical Impressions Patient is s/p cabg x3 surgery resulting in functional limitations due to the deficits listed below (see OT problem list). Pt is indep normally and enjoys watching general hospital at 2pm daily. Pt reports that she must be indep for d/c home as she has more prn (A) from family. Pt with good return demo of sternal precautions with adls but needs reinforcing with sit<>Stand hand placement. Pt reports poor po intake due to lack of appetite.  Patient will benefit from skilled OT acutely to increase independence and safety with ADLS to allow discharge home .      If plan is discharge home, recommend the following:   A little help with bathing/dressing/bathroom     Functional Status Assessment   Patient has had a recent decline in their functional status and demonstrates the ability to make significant improvements in function in a reasonable and predictable amount of time.     Equipment Recommendations   Tub/shower seat     Recommendations for Other Services         Precautions/Restrictions   Precautions Precautions: Sternal Recall of Precautions/Restrictions: Intact Precaution/Restrictions Comments: handout provided and reviewed for sternal precautions with adls. Restrictions Weight Bearing Restrictions Per Provider Order: Yes RUE Weight Bearing Per Provider Order: Non weight bearing LUE Weight Bearing Per Provider Order: Non weight bearing     Mobility Bed Mobility               General bed mobility comments: oob on arrival. pt encourage to sit up as its lunch time. pt reports poor po intake. pt encourage to take 2 bites or sips    Transfers Overall  transfer level: Needs assistance   Transfers: Sit to/from Stand Sit to Stand: Contact guard assist                  Balance                                           ADL either performed or assessed with clinical judgement   ADL Overall ADL's : Needs assistance/impaired Eating/Feeding: Modified independent   Grooming: Modified independent;Brushing hair Grooming Details (indicate cue type and reason): extensive time spent on helping detangle hair during session. pt able to pull hair foward and comb it with good sternal precautions             Lower Body Dressing: Modified independent Lower Body Dressing Details (indicate cue type and reason): pt is able to figure 4 cross Toilet Transfer: Contact guard assist Toilet Transfer Details (indicate cue type and reason): cues for hands on knees to keep sternal precautions     Tub/ Shower Transfer: Contact guard assist Tub/Shower Transfer Details (indicate cue type and reason): educated on shower seat and washing with clean linen around wound Functional mobility during ADLs: Supervision/safety;Rolling walker (2 wheels)       Vision Baseline Vision/History: 0 No visual deficits Patient Visual Report: No change from baseline Vision Assessment?: No apparent visual deficits     Perception         Praxis  Pertinent Vitals/Pain Pain Assessment Pain Assessment: No/denies pain     Extremity/Trunk Assessment Upper Extremity Assessment Upper Extremity Assessment: Overall WFL for tasks assessed   Lower Extremity Assessment Lower Extremity Assessment: Defer to PT evaluation   Cervical / Trunk Assessment Cervical / Trunk Assessment: Normal   Communication Communication Communication: No apparent difficulties   Cognition Arousal: Alert Behavior During Therapy: Flat affect Cognition: No apparent impairments                                       Cueing  General Comments       incision dry and open to air   Exercises     Shoulder Instructions      Home Living Family/patient expects to be discharged to:: Private residence Living Arrangements: Children Available Help at Discharge: Family;Available PRN/intermittently (reports daughter is minimal (A)) Type of Home: House Home Access: Stairs to enter [employer] of Steps: 1 Entrance Stairs-Rails: None Home Layout: One level     Bathroom Shower/Tub: Chief Strategy Officer: Standard     Home Equipment: Agricultural consultant (2 wheels)   Additional Comments: reports cat down stairs only and daughte rwill manage      Prior Functioning/Environment Prior Level of Function : Independent/Modified Independent                    OT Problem List: Decreased strength;Impaired balance (sitting and/or standing);Decreased safety awareness;Decreased knowledge of use of DME or AE;Decreased knowledge of precautions;Cardiopulmonary status limiting activity   OT Treatment/Interventions: Self-care/ADL training;Energy conservation;DME and/or AE instruction;Therapeutic activities;Balance training;Patient/family education;Therapeutic exercise      OT Goals(Current goals can be found in the care plan section)   Acute Rehab OT Goals Patient Stated Goal: to comb hair OT Goal Formulation: With patient Time For Goal Achievement: 09/03/23 Potential to Achieve Goals: Good   OT Frequency:  Min 2X/week    Co-evaluation              AM-PAC OT 6 Clicks Daily Activity     Outcome Measure Help from another person eating meals?: None Help from another person taking care of personal grooming?: None Help from another person toileting, which includes using toliet, bedpan, or urinal?: A Little Help from another person bathing (including washing, rinsing, drying)?: A Little Help from another person to put on and taking off regular upper body clothing?: None Help from another person to put on and  taking off regular lower body clothing?: A Little 6 Click Score: 21   End of Session Equipment Utilized During Treatment: Rolling walker (2 wheels) Nurse Communication: Mobility status;Precautions  Activity Tolerance: Patient tolerated treatment well Patient left: in chair;with call bell/phone within reach  OT Visit Diagnosis: Unsteadiness on feet (R26.81);Muscle weakness (generalized) (M62.81)                Time: 8852-8780 OT Time Calculation (min): 32 min Charges:  OT General Charges $OT Visit: 1 Visit OT Evaluation $OT Eval Moderate Complexity: 1 Mod   Brynn, OTR/L  Acute Rehabilitation Services Office: [phone] .   Ely Molt 08/20/2023, 12:32 PM

## 2023-08-20 NOTE — Plan of Care (Signed)
  Problem: Education: Goal: Knowledge of General Education information will improve Description: Including pain rating scale, medication(s)/side effects and non-pharmacologic comfort measures Outcome: Progressing   Problem: Health Behavior/Discharge Planning: Goal: Ability to manage health-related needs will improve Outcome: Not Progressing   Problem: Clinical Measurements: Goal: Ability to maintain clinical measurements within normal limits will improve Outcome: Progressing Goal: Will remain free from infection Outcome: Progressing Goal: Diagnostic test results will improve Outcome: Progressing Goal: Respiratory complications will improve Outcome: Progressing Goal: Cardiovascular complication will be avoided Outcome: Progressing   Problem: Activity: Goal: Risk for activity intolerance will decrease Outcome: Progressing   Problem: Nutrition: Goal: Adequate nutrition will be maintained Outcome: Not Progressing   Problem: Coping: Goal: Level of anxiety will decrease Outcome: Not Progressing   Problem: Elimination: Goal: Will not experience complications related to bowel motility Outcome: Progressing Goal: Will not experience complications related to urinary retention Outcome: Progressing   Problem: Pain Managment: Goal: General experience of comfort will improve and/or be controlled Outcome: Progressing   Problem: Safety: Goal: Ability to remain free from injury will improve Outcome: Progressing   Problem: Skin Integrity: Goal: Risk for impaired skin integrity will decrease Outcome: Progressing   Problem: Education: Goal: Understanding of cardiac disease, CV risk reduction, and recovery process will improve Outcome: Progressing Goal: Individualized Educational Video(s) Outcome: Progressing   Problem: Activity: Goal: Ability to tolerate increased activity will improve Outcome: Progressing   Problem: Cardiac: Goal: Ability to achieve and maintain adequate  cardiovascular perfusion will improve Outcome: Progressing   Problem: Health Behavior/Discharge Planning: Goal: Ability to safely manage health-related needs after discharge will improve Outcome: Progressing   Problem: Education: Goal: Will demonstrate proper wound care and an understanding of methods to prevent future damage Outcome: Progressing Goal: Knowledge of disease or condition will improve Outcome: Progressing Goal: Knowledge of the prescribed therapeutic regimen will improve Outcome: Progressing Goal: Individualized Educational Video(s) Outcome: Progressing   Problem: Activity: Goal: Risk for activity intolerance will decrease Outcome: Progressing   Problem: Cardiac: Goal: Will achieve and/or maintain hemodynamic stability Outcome: Progressing   Problem: Clinical Measurements: Goal: Postoperative complications will be avoided or minimized Outcome: Progressing   Problem: Respiratory: Goal: Respiratory status will improve Outcome: Progressing   Problem: Skin Integrity: Goal: Wound healing without signs and symptoms of infection Outcome: Progressing Goal: Risk for impaired skin integrity will decrease Outcome: Progressing   Problem: Urinary Elimination: Goal: Ability to achieve and maintain adequate renal perfusion and functioning will improve Outcome: Progressing   Problem: Education: Goal: Ability to describe self-care measures that may prevent or decrease complications (Diabetes Survival Skills Education) will improve Outcome: Progressing Goal: Individualized Educational Video(s) Outcome: Progressing   Problem: Coping: Goal: Ability to adjust to condition or change in health will improve Outcome: Progressing   Problem: Fluid Volume: Goal: Ability to maintain a balanced intake and output will improve Outcome: Progressing   Problem: Health Behavior/Discharge Planning: Goal: Ability to identify and utilize available resources and services will  improve Outcome: Progressing Goal: Ability to manage health-related needs will improve Outcome: Progressing   Problem: Metabolic: Goal: Ability to maintain appropriate glucose levels will improve Outcome: Progressing   Problem: Nutritional: Goal: Maintenance of adequate nutrition will improve Outcome: Progressing Goal: Progress toward achieving an optimal weight will improve Outcome: Progressing   Problem: Skin Integrity: Goal: Risk for impaired skin integrity will decrease Outcome: Progressing   Problem: Tissue Perfusion: Goal: Adequacy of tissue perfusion will improve Outcome: Progressing

## 2023-08-20 NOTE — Progress Notes (Signed)
  Echocardiogram 2D Echocardiogram has been performed.  Koleen KANDICE Popper, RDCS 08/20/2023, 1:02 PM

## 2023-08-20 NOTE — Progress Notes (Signed)
 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 USING LEFT INTERNAL MAMMARY ARTERY AND ENDOSCOPICALLY HARVESTED RIGHT GREATER SAPHENOUS VEIN (N/A) ECHOCARDIOGRAM, TRANSESOPHAGEAL (N/A)   Total Length of Stay:  LOS: 10 days  Events:   No events Stable day    BP 108/64   Pulse 84   Temp 97.6 F (36.4 C) (Oral)   Resp 18   Ht 5' 1 (1.549 m)   Wt 50.7 kg   SpO2 99%   BMI 21.12 kg/m           I/O last 3 completed shifts: In: 472.1 [I.V.:425.6; IV Piggyback:46.5] Out: 1545 [Urine:1545]      Latest Ref Rng & Units 08/20/2023    3:19 AM 08/19/2023    4:19 AM 08/18/2023    4:14 AM  CBC  WBC 4.0 - 10.5 K/uL 10.8  7.0  13.9   Hemoglobin 12.0 - 15.0 g/dL 8.8  7.5  7.8   Hematocrit 36.0 - 46.0 % 27.1  23.1  23.4   Platelets 150 - 400 K/uL 137  76  100        Latest Ref Rng & Units 08/20/2023    3:19 AM 08/19/2023    4:19 AM 08/18/2023    4:14 AM  BMP  Glucose 70 - 99 mg/dL 892  81  878   BUN 6 - 20 mg/dL 16  10  9    Creatinine 0.44 - 1.00 mg/dL 9.01  9.13  8.96   Sodium 135 - 145 mmol/L 135  136  133   Potassium 3.5 - 5.1 mmol/L 3.7  4.0  3.8   Chloride 98 - 111 mmol/L 98  98  99   CO2 22 - 32 mmol/L 27  29  26    Calcium  8.9 - 10.3 mg/dL 8.4  8.3  8.2     ABG    Component Value Date/Time   PHART 7.316 (L) 08/17/2023 1630   PCO2ART 55.5 (H) 08/17/2023 1630   PO2ART 90 08/17/2023 1630   HCO3 28.3 (H) 08/17/2023 1630   TCO2 30 08/17/2023 1630   ACIDBASEDEF 2.0 08/16/2023 2235   O2SAT 63.6 08/19/2023 0419       Linnie Rayas, MD 08/20/2023 4:03 PM

## 2023-08-20 NOTE — Discharge Summary (Signed)
 [address] [place] [address]             [phone]        Physician Discharge Summary  Patient ID: JNAI SNELLGROVE MRN: [id number] DOB/AGE: 1965-11-02 58 y.o.  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   Slurred speech 03/13/2022   Left subclavian artery occlusion 09/21/2021   Subclavian arterial stenosis (HCC) 08/28/2021   Chest pain, rule out acute myocardial infarction 07/22/2018   ICD (implantable cardioverter-defibrillator) in place 01/26/2017   Nonischemic cardiomyopathy (HCC) 01/26/2017   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/25/2016   Stenosis of left subclavian artery (HCC) 08/22/2016   Aortoiliac occlusive disease (HCC) 08/22/2016   Acute on chronic systolic CHF (congestive heart failure) (HCC)    Mitral regurgitation 08/17/2016   Heart failure (HCC) 08/17/2016   Shortness of breath    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   Diarrhea 10/05/2014   Syncope 03/09/2014   Chronic systolic CHF (congestive heart failure) (HCC) 01/12/2014   Chest pain, atypical, muscular skelatal   01/09/2014   Diastolic CHF, acute (HCC) 01/08/2014   Chest pain at rest 01/08/2014   Congestive heart disease (HCC)    Post-op pain-Right groin area 05/27/2013   Drainage from wound-Groin area 05/27/2013   Retroperitoneal hematoma 05/01/2013   Retroperitoneal bleed 04/15/2013   Anemia due to blood loss 04/15/2013   Hyperlipidemia with target LDL less than 70 04/15/2013   Intermediate coronary syndrome (HCC)    Chest pain 04/11/2013   CAD - CFX DES 01/17/13 01/20/2013   Rash post PCI 01/20/2013   NSTEMI (non-ST elevated myocardial infarction) (HCC) 01/17/2013   Chest  pain, musculoskeletal 10/07/2012   HTN (hypertension) 10/07/2012   LBBB (left bundle branch block) 10/07/2012   Tobacco abuse 10/07/2012     Discharge Diagnoses:  Patient Active Problem List   Diagnosis Date Noted   Pleural effusion 08/22/2023   S/P CABG x 3 08/16/2023   Unstable angina (HCC) 12/29/2022   Acute stroke due to ischemia (HCC) 03/15/2022   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   Slurred speech 03/13/2022   Left subclavian artery occlusion 09/21/2021   Subclavian arterial stenosis (HCC) 08/28/2021   Chest pain, rule out acute myocardial infarction 07/22/2018   ICD (implantable cardioverter-defibrillator) in place 01/26/2017   Nonischemic cardiomyopathy (HCC) 01/26/2017   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/25/2016   Stenosis of left subclavian artery (HCC) 08/22/2016   Aortoiliac occlusive disease (HCC) 08/22/2016   Acute on chronic systolic CHF (congestive heart failure) (HCC)    Mitral regurgitation 08/17/2016   Heart failure (HCC) 08/17/2016   Shortness of breath    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   Diarrhea 10/05/2014   Syncope 03/09/2014   Chronic systolic CHF (congestive heart failure) (HCC) 01/12/2014   Chest pain, atypical, muscular skelatal   01/09/2014   Diastolic CHF, acute (HCC) 01/08/2014   Chest pain at rest 01/08/2014   Congestive heart disease (HCC)    Post-op pain-Right groin area 05/27/2013   Drainage from wound-Groin area 05/27/2013   Retroperitoneal hematoma 05/01/2013   Retroperitoneal bleed 04/15/2013   Anemia due to blood loss  04/15/2013   Hyperlipidemia with target LDL less than 70 04/15/2013   Intermediate coronary syndrome [facility])    Chest pain 04/11/2013   CAD - CFX DES 01/17/13 01/20/2013   Rash post PCI 01/20/2013   NSTEMI (non-ST elevated myocardial infarction) (HCC) 01/17/2013   Chest pain, musculoskeletal 10/07/2012   HTN (hypertension) 10/07/2012   LBBB (left bundle branch block) 10/07/2012    Tobacco abuse 10/07/2012     Discharged Condition: good   History of Present Illness: At the time of CT surgical consultation   Lindsay Camacho is a 58 yo female with known history of CAD with prior stenting, chronic HFrEF, ICD placed in 2019, MV repair, HLD, subclavian stenosis with prior stenting,  and HLD.  Her Coronary disease dates back to 2015 at which time she suffered an MI and underwent stenting of her Left Circumflex.  She suffered a retroperitoneal bleed post procedure.  Repeat catheterization in 2018 showed patent previously placed stent.  She did however undergo Minimally Invasive Mitral Valve Repair by Dr. Dusty in 2018 and overall this was uncomplicated.  She presented to OSH ED in [place], [place] with complaints of chest pain.  While being evaluated in the ED she did experience an ICD shock, interrogation revealed patient was noted to be in V. Fib at time of shock.  Workup showed elevated troponins and in setting of ICD shock they were concerned that ischemia was the source of the arrhythmia.  They performed cardiac catheterization which showed 3V CAD.  They felt transfer to [facility] was indicated for cardiothoracic surgical consultation.  Upon arrival she was started on Heparin  gtt.  Echocardiogram was obtained and showed previously repair Mitral Valve with mild MR.  There was evidence of Tricuspid Regurgitation, and EF of 55%.  She developed recurrent complaints of chest pain 8/9 described as being pressure like in quality.  This responded to Morphine .  She was also tender along LSB which would likely be expected in setting of recent ICD shock.  It was felt the patient should be placed on NTG drip and titrate as needed.  This has helped improve patient's chest pain however, she continues to experience.  She experiences shortness of breath with activity.  She is disabled.  She is a current smoker of 1/2 ppd x 40 years.. stated she was younger than 42.  She has a family history of CAD  with both parents being deceased.  She lives with her daughter who will be able to provide assistance at discharge.  The patient and all relevant studies were evaluated by Dr. Kerrin who recommended CABG.  Hospital course: Following full diagnostic evaluation and medical stabilization she was felt able to proceed and on 08/16/2023 she was taken to the operating room at which time she underwent CABG x 3.  LIMA-LAD, SVG-OM and a SVG-PDA were placed.  She tolerated the procedure well was taken to the surgical intensive care unit in stable condition.  Postoperative hospital course:  Patient was extubated using standard post cardiac surgical protocols.  Hemodynamics have been acceptable but she did require both milrinone  and Levophed  early on.  These were weaned over time.  She is AV sequential paced and AICD was recalibrated in the OR.  Blood sugars have been under good control using standard post cardiac surgical protocols.  She has required an expected diuresis for volume overload.  The advanced heart failure team has assisted with management of maximizing postoperative cardiac meds.  She does have severe tricuspid regurgitation and  dilated RV.  GDMT has been initiated as able.  Ejection fraction is in the 55 to 60% range preoperatively and TEE postop showed moderately reduced EF, mildly reduced RV function.  She did have an episode of postoperative atrial fibrillation and amiodarone  was initially initiated.  He was subsequently discontinued by the heart failure team.  Milrinone  was  weaned over time without difficulty.  All routine lines, monitors and drainage devices were discontinued in standard fashion.  She has a thrombocytopenia and did receive platelet transfusion and is also being monitored clinically.  It is likely reactive.  Ultimately platelet count has returned to normal and she has been started on Plavix  for non-STEMI. She has made  slow but steady progress in terms of physical rehab and PT/OT  consults were obtained to assist.  There was some concern that she could have developed a pulmonary embolism and CT scan was done and this was ruled out.  The critical care medicine service did assist with intensive care unit management.  Inhalers were used to assist with weaning oxygen.  She has felt likely to have COPD.  Home care arrangements have also been put in place to continue therapies.  Beta-blocker has been held and medications have been adjusted per the advanced heart failure team recommendations.  She does have expected acute blood loss anemia which is stabilized.  She has been placed on iron supplementation.  Oxygen has been weaned and she maintains good saturations on room air.  Most recent creatinine is noted to be 1.23.  Incisions are noted to be healing well without evidence of infection.  She is tolerating diet.  Overall, at the time of discharge the patient is felt to be right stable.  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 EXAM: CT ANGIOGRAPHY CHEST WITH CONTRAST TECHNIQUE: Multidetector CT imaging of the chest was performed using the standard protocol during bolus administration of intravenous contrast. Multiplanar CT image reconstructions and MIPs were obtained to evaluate the vascular anatomy. RADIATION DOSE REDUCTION: This exam was performed according to the departmental dose-optimization program which includes automated exposure control, adjustment of the mA and/or kV according to patient size and/or use of iterative reconstruction technique. CONTRAST:  75mL OMNIPAQUE  IOHEXOL  350 MG/ML SOLN COMPARISON:  Chest CTA 03/20/2023 and 12/28/2022. FINDINGS: Cardiovascular: The pulmonary  arteries are well opacified with contrast to the level of the segmental branches. There is no evidence of acute pulmonary embolism. No significant central enlargement of the pulmonary arteries. Atherosclerosis of the aorta, great vessels and coronary arteries status post median sternotomy, CABG, mitral valve replacement and left subclavian stenting. Left subclavian pacemaker/defibrillator leads extend into the right atrium and right ventricle. Increased cardiomegaly with dilatation of the right atrium and right ventricle. Small pericardial effusion noted. Mediastinum/Nodes: There are no enlarged mediastinal, hilar or axillary lymph nodes. The thyroid gland, trachea and esophagus demonstrate no significant findings. Lungs/Pleura: New small to moderate dependent bilateral pleural effusions with associated dependent atelectasis at both lung bases. No confluent airspace disease or suspicious nodularity. Upper abdomen: Increased dilatation and reflux of contrast into the IVC and hepatic veins. Irregular abdominal and branch vessel atherosclerosis without evidence of large vessel occlusion. No acute findings are seen in the visualized upper abdomen. Musculoskeletal/Chest wall: There is no chest wall  mass or suspicious osseous finding. Healed median sternotomy. Review of the MIP images confirms the above findings. IMPRESSION: 1. No evidence of acute pulmonary embolism or other acute vascular findings in the chest. 2. Increased cardiomegaly with dilatation of the right atrium and right ventricle and reflux of contrast into the IVC and hepatic veins, consistent with right heart failure. 3. New small to moderate dependent bilateral pleural effusions with associated dependent atelectasis at both lung bases. 4.  Aortic Atherosclerosis (ICD10-I70.0). Electronically Signed   By: Elsie Perone M.D.   On: 08/21/2023 10:27   ECHOCARDIOGRAM LIMITED Result Date: 08/20/2023    ECHOCARDIOGRAM LIMITED REPORT   Patient Name:   Lindsay Camacho Date of Exam: 08/20/2023 Medical Rec #:  [id number]     Height:       61.0 in Accession #:    [id number]    Weight:       111.8 lb Date of Birth:  01-21-1965     BSA:          1.475 m Patient Age:    58 years      BP:           116/70 mmHg Patient Gender: F             HR:           84 bpm. Exam Location:  Inpatient Procedure: Cardiac Doppler, Limited Color Doppler and Limited Echo (Both            Spectral and Color Flow Doppler were utilized during procedure). Indications:     CAD native vessel I25.10  History:         Patient has prior history of Echocardiogram examinations, most                  recent 08/16/2023. CHF, CAD and Previous Myocardial Infarction,                  Prior CABG, Pacemaker and Defibrillator, Mitral Ring valve                  present in mitral position. Procedure date: 08/25/2016; Risk                  Factors:Dyslipidemia and Hypertension.  Sonographer:     Koleen Popper RDCS Referring Phys:  [id number] DANIEL R BENSIMHON Diagnosing Phys: Ezra Kanner  Sonographer Comments: Image acquisition challenging due to patient body habitus and Image acquisition challenging due to respiratory motion. IMPRESSIONS  1. Left ventricular ejection fraction, by estimation, is 60 to 65%. The left ventricle has normal function. The left ventricle has no regional wall motion abnormalities. There is mild concentric left ventricular hypertrophy. Left ventricular diastolic parameters are indeterminate.  2. D-shaped septum suggestive of RV pressure/volume overload. Peak RV-RA gradient 21 mmHg. Right ventricular systolic function is normal. The right ventricular size is mildly enlarged. The RV was not well-visualized.  3. Status post mitral valve repair. Mean gradient 4 mmHg suggests that there is not significant mitral stenosis present. No significant mitral regurgitation noted.  4. The tricuspid valve is abnormal. Tricuspid valve regurgitation is severe, ?impingement from ICD but tricuspid valve was not  well-visualized.  5. The aortic valve is tricuspid. Aortic valve regurgitation is mild. No aortic stenosis is present.  6. IVC not visualized.  7. A small pericardial effusion is present.  8. Technically difficult study with poor windows. No subcostal views. FINDINGS  Left Ventricle: Left ventricular ejection fraction, by estimation, is  60 to 65%. The left ventricle has normal function. The left ventricle has no regional wall motion abnormalities. The left ventricular internal cavity size was normal in size. There is  mild concentric left ventricular hypertrophy. Left ventricular diastolic parameters are indeterminate. Right Ventricle: D-shaped septum suggestive of RV pressure/volume overload. Peak RV-RA gradient 21 mmHg. The right ventricular size is mildly enlarged. Right vetricular wall thickness was not well visualized. Right ventricular systolic function is normal. Pericardium: A small pericardial effusion is present. Mitral Valve: Status post mitral valve repair. Mean gradient 4 mmHg suggests that there is not significant mitral stenosis present. No significant mitral regurgitation noted. The mean mitral valve gradient is 4.0 mmHg. Tricuspid Valve: The tricuspid valve is abnormal. Tricuspid valve regurgitation is severe. Aortic Valve: The aortic valve is tricuspid. Aortic valve regurgitation is mild. No aortic stenosis is present. Aorta: The aortic root is normal in size and structure. Venous: IVC not visualized. Additional Comments: A device lead is visualized in the right ventricle. Spectral Doppler performed. Color Doppler performed.  LEFT VENTRICLE PLAX 2D LVIDd:         3.90 cm LVIDs:         2.60 cm LV PW:         1.00 cm LV IVS:        1.20 cm LVOT diam:     1.60 cm LV SV:         48 LV SV Index:   33 LVOT Area:     2.01 cm  LEFT ATRIUM         Index LA diam:    3.60 cm 2.44 cm/m  AORTIC VALVE LVOT Vmax:   150.00 cm/s LVOT Vmean:  93.800 cm/s LVOT VTI:    0.239 m  AORTA Ao Root diam: 2.60 cm MITRAL  VALVE                TRICUSPID VALVE MV Area (PHT): 3.21 cm     TR Peak grad:   20.8 mmHg MV Mean grad:  4.0 mmHg     TR Vmax:        228.00 cm/s MV Decel Time: 236 msec MV E velocity: 126.00 cm/s  SHUNTS MV A velocity: 116.00 cm/s  Systemic VTI:  0.24 m MV E/A ratio:  1.09         Systemic Diam: 1.60 cm Dalton McleanMD Electronically signed by Ezra Kanner Signature Date/Time: 08/20/2023/8:02:19 PM    Final (Updated)    DG CHEST PORT 1 VIEW Result Date: 08/20/2023 CLINICAL DATA:  [id number] Hypoxia [id number] EXAM: DG CHEST 1V PORT COMPARISON:  Multiple, most recent August 19, 2023 FINDINGS: Interval removal of the right IJ approach introducer sheath. Persistent small pleural effusions bilaterally with subsegmental atelectasis in the lung bases. No pneumothorax. Moderate cardiomegaly. Left chest pacemaker/AICD with leads terminating in the right atrium and ventricle. Tortuous aorta with aortic atherosclerosis. Sternotomy wires and CABG markers with mitral valve repair. Multilevel thoracic osteophytosis. IMPRESSION: Unchanged small bilateral pleural effusions with bibasilar atelectasis. Electronically Signed   By: Rogelia Myers M.D.   On: 08/20/2023 10:55   DG Chest Port 1 View Result Date: 08/19/2023 CLINICAL DATA:  History of CABG EXAM: PORTABLE CHEST 1 VIEW COMPARISON:  08/17/2023 FINDINGS: Single frontal view of the chest demonstrates multi lead pacer/AICD unchanged. Right internal jugular Cordis remains, tip overlying superior vena cava. The flow directed catheter, mediastinal drain, and bilateral chest tubes have been removed in the interim. Cardiac silhouette remains enlarged. There is persistent patchy  bibasilar consolidation favor atelectasis. Trace bilateral pleural effusions. No pneumothorax. IMPRESSION: 1. Stable enlarged cardiac silhouette. 2. Persistent bibasilar atelectasis with trace bilateral pleural effusions. Electronically Signed   By: Ozell Daring M.D.   On: 08/19/2023 09:19   ECHO  INTRAOPERATIVE TEE Result Date: 08/19/2023  *INTRAOPERATIVE TRANSESOPHAGEAL REPORT *  Patient Name:   Lindsay Camacho Date of Exam: 08/16/2023 Medical Rec #:  [id number]     Height:       61.0 in Accession #:    [id number]    Weight:       110.7 lb Date of Birth:  June 04, 1965     BSA:          1.47 m Patient Age:    58 years      BP:           153/88 mmHg Patient Gender: F             HR:           60 bpm. Exam Location:  Inpatient Transesophogeal exam was perform intraoperatively during surgical procedure. Patient was closely monitored under general anesthesia during the entirety of examination. Indications:     CABG Performing Phys: [id number] STEVEN C HENDRICKSON Complications: No known complications during this procedure. POST-OP IMPRESSIONS _ Left Ventricle: has moderately reduced systolic function, with an ejection fraction of 35%. _ Right Ventricle: The right ventricle appears unchanged from pre-bypass. _ Aorta: The aorta appears unchanged from pre-bypass. _ Left Atrium: The left atrium appears unchanged from pre-bypass. _ Left Atrial Appendage: The left atrial appendage appears unchanged from pre-bypass. _ Aortic Valve: The aortic valve appears unchanged from pre-bypass. _ Mitral Valve: The mitral valve appears unchanged from pre-bypass. _ Tricuspid Valve: The tricuspid valve appears unchanged from pre-bypass. _ Pulmonic Valve: The pulmonic valve appears unchanged from pre-bypass. _ Interatrial Septum: The interatrial septum appears unchanged from pre-bypass. _ Interventricular Septum: The interventricular septum appears unchanged from pre-bypass. _ Pericardium: The pericardium appears unchanged from pre-bypass. PRE-OP  FINDINGS  Left Ventricle: The left ventricle has hyperdynamic systolic function, with an ejection fraction of >65%. The cavity size was normal. There is no left ventricular hypertrophy. Right Ventricle: The right ventricle has normal systolic function. The cavity was normal. There is no increase in  right ventricular wall thickness. Left Atrium: Left atrial size was normal in size. No left atrial/left atrial appendage thrombus was detected. Right Atrium: Right atrial size was normal in size. Interatrial Septum: No atrial level shunt detected by color flow Doppler. Pericardium: There is no evidence of pericardial effusion. Mitral Valve: The mitral valve has been repaired/replaced. Mitral valve regurgitation is not visualized by color flow Doppler. There is Mild mitral stenosis. Tricuspid Valve: The tricuspid valve was normal in structure. Tricuspid valve regurgitation is moderate by color flow Doppler. No evidence of tricuspid stenosis is present. Aortic Valve: The aortic valve is tricuspid Aortic valve regurgitation is mild by color flow Doppler. There is no stenosis of the aortic valve. There is mild thickening and mild calcification present on the aortic valve right coronary, left coronary and non-coronary cusps with normal mobility. Pulmonic Valve: The pulmonic valve was normal in structure. Pulmonic valve regurgitation is trivial by color flow Doppler. Aorta: The aortic root, ascending aorta and aortic arch are normal in size and structure. +--------------+--------++ LEFT VENTRICLE         +--------------+--------++ PLAX 2D                +--------------+--------++ LVIDd:  3.10 cm  +--------------+--------++ LVIDs:        1.20 cm  +--------------+--------++ LV PW:        0.60 cm  +--------------+--------++ LV IVS:       0.60 cm  +--------------+--------++ LVOT diam:    1.70 cm  +--------------+--------++ LV SV:        35 ml    +--------------+--------++ LV SV Index:  23.48    +--------------+--------++ LVOT Area:    2.27 cm +--------------+--------++                        +--------------+--------++ +-------------+-----------++ AORTIC VALVE             +-------------+-----------++ AV Vmax:     136.00 cm/s +-------------+-----------++ AV Peak  Grad:7.4 mmHg    +-------------+-----------++ +-------------+---------++ MITRAL VALVE           +--------------+-------+ +-------------+---------++ SHUNTS                MV Peak grad:2.1 mmHg  +--------------+-------+ +-------------+---------++ Systemic Diam:1.70 cm MV Mean grad:1.0 mmHg  +--------------+-------+ +-------------+---------++ MV Vmax:     0.73 m/s  +-------------+---------++ MV Vmean:    56.6 cm/s +-------------+---------++ MV VTI:      0.25 m    +-------------+---------++  Lamar Needle MD Electronically signed by Lamar Needle MD Signature Date/Time: 08/19/2023/8:28:45 AM    Final    DG Chest Port 1 View Result Date: 08/17/2023 CLINICAL DATA:  Status post coronary artery bypass graft. EXAM: PORTABLE CHEST 1 VIEW COMPARISON:  August 16, 2023. FINDINGS: Stable cardiomegaly. Status post cardiac valve repair. Left-sided defibrillator is unchanged. Right internal jugular Swan-Ganz catheter is noted with tip in expected position of main pulmonary artery. Mild bibasilar subsegmental atelectasis is noted. Bilateral chest tubes are noted without definite pneumothorax. Bony thorax is unremarkable. IMPRESSION: Mild bibasilar subsegmental atelectasis. Right-sided chest tube without definite pneumothorax. Electronically Signed   By: Lynwood Landy Raddle M.D.   On: 08/17/2023 08:28   DG Chest Port 1 View Result Date: 08/16/2023 CLINICAL DATA:  [id number] S/P CABG x 3 [id number]. EXAM: PORTABLE CHEST 1 VIEW COMPARISON:  08/15/2023. FINDINGS: Bilateral lung fields are clear. Bilateral costophrenic angles are clear. Stable cardio-mediastinal silhouette. There are surgical staples along the heart border and sternotomy wires, status post CABG (coronary artery bypass graft). There is a left sided 3-lead pacemaker. Mitral annuloplasty noted. No acute osseous abnormalities. The soft tissues are within normal limits. *Endotracheal tube noted with its tip approximately 2.5-3 cm above the  carina. *Enteric tube is seen coursing below the left hemidiaphragm with its tip outside the field of view. *Probable left-sided pleural drainage catheter noted with its tip overlapping with the cardiac pacemaker battery pack. *Probable right-sided pleural drainage catheter/overlying monitoring lead along the right hemidiaphragm. *Right IJ Swan-Ganz sheath noted. *Right IJ Swan-Ganz catheter with its tip overlying the main pulmonary outflow tract. *Probable epicardial pacer wires noted. IMPRESSION: *No acute cardiopulmonary abnormality. Support apparatus as described above. Electronically Signed   By: Ree Molt M.D.   On: 08/16/2023 14:52   DG Chest 2 View Result Date: 08/15/2023 CLINICAL DATA:  Preop chest exam.  Upcoming CABG. EXAM: CHEST - 2 VIEW COMPARISON:  Radiograph 03/20/2023 FINDINGS: Multilead left-sided pacemaker with unchanged leads. Prior mitral valve repair.The cardiomediastinal contours are stable. Vascular stent in the left upper mediastinum. The lungs are clear. Pulmonary vasculature is normal. No consolidation, pleural effusion, or pneumothorax. No acute osseous abnormalities are seen. IMPRESSION: No acute chest findings. Electronically Signed  By: Andrea Gasman M.D.   On: 08/15/2023 13:50   VAS US  DOPPLER PRE CABG Result Date: 08/13/2023 PREOPERATIVE VASCULAR EVALUATION Patient Name:  Lindsay Camacho  Date of Exam:   08/13/2023 Medical Rec #: [id number]      Accession #:    [id number] Date of Birth: [birth year]      Patient Gender: F Patient Age:   88 years Exam Location:  [facility] Procedure:      VAS US  DOPPLER PRE CABG Referring Phys: ELSPETH HENDRICKSON --------------------------------------------------------------------------------  Indications:  Pre-CABG. Risk Factors: Hypertension, hyperlipidemia, current smoker, prior MI, coronary               artery disease, prior CVA. Performing Technologist: Jimmye Scarce RVT  Examination Guidelines: A complete evaluation includes  B-mode imaging, spectral Doppler, color Doppler, and power Doppler as needed of all accessible portions of each vessel. Bilateral testing is considered an integral part of a complete examination. Limited examinations for reoccurring indications may be performed as noted.  Right Carotid Findings: +----------+--------+--------+--------+--------------------------+--------+           PSV cm/sEDV cm/sStenosisDescribe                  Comments +----------+--------+--------+--------+--------------------------+--------+ CCA Prox  308     27                                                 +----------+--------+--------+--------+--------------------------+--------+ CCA Distal53      12              smooth and heterogenous            +----------+--------+--------+--------+--------------------------+--------+ ICA Prox  46      17      1-39%   irregular and heterogenous         +----------+--------+--------+--------+--------------------------+--------+ ICA Mid   65      28                                                 +----------+--------+--------+--------+--------------------------+--------+ ICA Distal66      18                                                 +----------+--------+--------+--------+--------------------------+--------+ ECA       93                                                         +----------+--------+--------+--------+--------------------------+--------+ +----------+--------+-------+----------------+------------+           PSV cm/sEDV cmsDescribe        Arm Pressure +----------+--------+-------+----------------+------------+ Subclavian188            Multiphasic, WNL130          +----------+--------+-------+----------------+------------+ +---------+--------+--+--------+--+---------+ VertebralPSV cm/s50EDV cm/s11Antegrade +---------+--------+--+--------+--+---------+ Irregular, heterogeneous plaque is seen with increased velocities in the  innominate artery up to 380 cm/sec. Left Carotid Findings: +----------+--------+--------+--------+--------------------------+--------+  PSV cm/sEDV cm/sStenosisDescribe                  Comments +----------+--------+--------+--------+--------------------------+--------+ CCA Prox  118     18                                                 +----------+--------+--------+--------+--------------------------+--------+ CCA Distal75      17              smooth and heterogenous            +----------+--------+--------+--------+--------------------------+--------+ ICA Prox  93      19      1-39%   irregular and heterogenous         +----------+--------+--------+--------+--------------------------+--------+ ICA Mid   102     19                                                 +----------+--------+--------+--------+--------------------------+--------+ ICA Distal93      22                                                 +----------+--------+--------+--------+--------------------------+--------+ ECA       122                                                        +----------+--------+--------+--------+--------------------------+--------+ +----------+--------+--------+--------+------------+ SubclavianPSV cm/sEDV cm/sDescribeArm Pressure +----------+--------+--------+--------+------------+           232             Stenotic             +----------+--------+--------+--------+------------+ +---------+--------+--+--------+-+---------------+ VertebralPSV cm/s25EDV cm/s0Bi- directional +---------+--------+--+--------+-+---------------+ Known left subclavian art stenosis ABI Findings: +------------------+-----+---------+ Rt Pressure (mmHg)IndexWaveform  +------------------+-----+---------+ 130                    biphasic  +------------------+-----+---------+ 159               1.22 triphasic +------------------+-----+---------+ 148               1.14  triphasic +------------------+-----+---------+ +------------------+-----+---------+-------+ Lt Pressure (mmHg)IndexWaveform Comment +------------------+-----+---------+-------+                                 IV      +------------------+-----+---------+-------+ 164               1.26 triphasic        +------------------+-----+---------+-------+ 158               1.22 biphasic         +------------------+-----+---------+-------+ +-------+---------------+ ABI/TBIToday's ABI/TBI +-------+---------------+ Right  1.22            +-------+---------------+ Left   1.26            +-------+---------------+  Right Doppler Findings: +--------+--------+--------+ Site    PressureDoppler  +--------+--------+--------+ Brachial130     biphasic +--------+--------+--------+  Left  Doppler Findings: +--------+--------+ Site    Comments +--------+--------+ BrachialIV       +--------+--------+   Summary: Right Carotid: Velocities in the right ICA are consistent with a 1-39% stenosis.                Stenotic innominate artery. Left Carotid: Velocities in the left ICA are consistent with a 1-39% stenosis. Vertebrals:  Right vertebral artery demonstrates antegrade flow. Left vertebral              artery demonstrates bidirectional flow. Subclavians: Left subclavian artery was stenotic. Normal flow hemodynamics were              seen in the right subclavian artery. Right ABI: Resting right ankle-brachial index is within normal range. Left ABI: Resting left ankle-brachial index is within normal range. Right Upper Extremity: Doppler waveforms decrease >50% with right radial compression. Doppler waveforms remain within normal limits with right ulnar compression. Left Upper Extremity: Doppler waveforms remain within normal limits with left radial compression. Doppler waveforms remain within normal limits with left ulnar compression.  Electronically signed by Fonda Rim on 08/13/2023 at 6:49:51 PM.     Final    ECHOCARDIOGRAM COMPLETE Result Date: 08/12/2023    ECHOCARDIOGRAM REPORT   Patient Name:   Lindsay Camacho Date of Exam: 08/12/2023 Medical Rec #:  [id number]     Height:       61.0 in Accession #:    [id number]    Weight:       110.7 lb Date of Birth:  10-05-1965     BSA:          1.469 m Patient Age:    58 years      BP:           115/66 mmHg Patient Gender: F             HR:           53 bpm. Exam Location:  Inpatient Procedure: 2D Echo (Both Spectral and Color Flow Doppler were utilized during            procedure). Indications:    cad of native vessel  History:        Patient has prior history of Echocardiogram examinations, most                 recent 12/30/2022. CAD, Defibrillator, Arrythmias:LBBB,                 Signs/Symptoms:Chest Pain; Risk Factors:Current Smoker.                  Mitral Valve: 26 mm Edwards Mcarthy Adams prosthetic                 annuloplasty ring valve is present in the mitral position.                 Procedure Date: 08/25/16.  Sonographer:    Tinnie Barefoot RDCS Referring Phys: [id number] ROLLO JONELLE LOUDER IMPRESSIONS  1. Left ventricular ejection fraction, by estimation, is 55 to 60%. The left ventricle has normal function. The left ventricle has no regional wall motion abnormalities. There is mild left ventricular hypertrophy. Left ventricular diastolic parameters are indeterminate.  2. Right ventricular systolic function is normal. The right ventricular size is normal. There is mildly elevated pulmonary artery systolic pressure.  3. Left atrial size was mildly dilated.  4. The mitral valve is abnormal. Mild mitral valve regurgitation. Mild mitral stenosis. The mean  mitral valve gradient is 4.0 mmHg. Moderate mitral annular calcification. There is a 26 mm Edwards Mcarthy Adams prosthetic annuloplasty ring present in the  mitral position. Procedure Date: 08/25/16.  5. The tricuspid valve is abnormal. Tricuspid valve regurgitation is moderate.  6. The aortic valve is  tricuspid. Aortic valve regurgitation is mild to moderate. No aortic stenosis is present.  7. The inferior vena cava is dilated in size with >50% respiratory variability, suggesting right atrial pressure of 8 mmHg. FINDINGS  Left Ventricle: Left ventricular ejection fraction, by estimation, is 55 to 60%. The left ventricle has normal function. The left ventricle has no regional wall motion abnormalities. The left ventricular internal cavity size was normal in size. There is  mild left ventricular hypertrophy. Left ventricular diastolic parameters are indeterminate. Right Ventricle: The right ventricular size is normal. Right vetricular wall thickness was not well visualized. Right ventricular systolic function is normal. There is mildly elevated pulmonary artery systolic pressure. The tricuspid regurgitant velocity  is 2.97 m/s, and with an assumed right atrial pressure of 8 mmHg, the estimated right ventricular systolic pressure is 43.3 mmHg. Left Atrium: Left atrial size was mildly dilated. Right Atrium: Right atrial size was normal in size. Pericardium: There is no evidence of pericardial effusion. Mitral Valve: The mitral valve is abnormal. There is mild thickening of the mitral valve leaflet(s). There is mild calcification of the mitral valve leaflet(s). Moderate mitral annular calcification. Mild mitral valve regurgitation. There is a 26 mm Edwards Mcarthy Adams prosthetic annuloplasty ring present in the mitral position. Procedure Date: 08/25/16. Mild mitral valve stenosis. MV peak gradient, 10.5 mmHg. The mean mitral valve gradient is 4.0 mmHg. Tricuspid Valve: The tricuspid valve is abnormal. Tricuspid valve regurgitation is moderate . No evidence of tricuspid stenosis. Aortic Valve: The aortic valve is tricuspid. There is mild aortic valve annular calcification. Aortic valve regurgitation is mild to moderate. Aortic regurgitation PHT measures 666 msec. No aortic stenosis is present. Aortic valve mean  gradient measures 6.2 mmHg. Aortic valve peak gradient measures 14.9 mmHg. Aortic valve area, by VTI measures 1.70 cm. Pulmonic Valve: The pulmonic valve was not well visualized. Pulmonic valve regurgitation is not visualized. No evidence of pulmonic stenosis. Aorta: The aortic root and ascending aorta are structurally normal, with no evidence of dilitation. Venous: The inferior vena cava is dilated in size with greater than 50% respiratory variability, suggesting right atrial pressure of 8 mmHg. IAS/Shunts: No atrial level shunt detected by color flow Doppler. Additional Comments: A device lead is visualized in the right atrium and right ventricle.  LEFT VENTRICLE PLAX 2D LVIDd:         3.80 cm LVIDs:         2.90 cm LV PW:         1.10 cm LV IVS:        1.10 cm LVOT diam:     1.90 cm LV SV:         65 LV SV Index:   45 LVOT Area:     2.84 cm  LV Volumes (MOD) LV vol d, MOD A4C: 66.2 ml LV vol s, MOD A4C: 37.9 ml LV SV MOD A4C:     66.2 ml RIGHT VENTRICLE             IVC RV Basal diam:  3.00 cm     IVC diam: 2.20 cm RV S prime:     11.90 cm/s TAPSE (M-mode): 1.6 cm LEFT ATRIUM  Index        RIGHT ATRIUM           Index LA diam:        4.00 cm 2.72 cm/m   RA Area:     14.10 cm LA Vol (A2C):   46.7 ml 31.80 ml/m  RA Volume:   32.40 ml  22.06 ml/m LA Vol (A4C):   50.4 ml 34.31 ml/m LA Biplane Vol: 50.8 ml 34.59 ml/m  AORTIC VALVE AV Area (Vmax):    1.57 cm AV Area (Vmean):   1.69 cm AV Area (VTI):     1.70 cm AV Vmax:           192.75 cm/s AV Vmean:          112.088 cm/s AV VTI:            0.384 m AV Peak Grad:      14.9 mmHg AV Mean Grad:      6.2 mmHg LVOT Vmax:         107.00 cm/s LVOT Vmean:        67.000 cm/s LVOT VTI:          0.231 m LVOT/AV VTI ratio: 0.60 AI PHT:            666 msec  AORTA Ao Root diam: 2.40 cm Ao Asc diam:  2.80 cm MITRAL VALVE            TRICUSPID VALVE MV Peak grad: 10.5 mmHg TR Peak grad:   35.3 mmHg MV Mean grad: 4.0 mmHg  TR Vmax:        297.00 cm/s MV Vmax:       1.62 m/s MV Vmean:     94.8 cm/s SHUNTS                         Systemic VTI:  0.23 m                         Systemic Diam: 1.90 cm Dorn Ross MD Electronically signed by Dorn Ross MD Signature Date/Time: 08/12/2023/12:47:32 PM    Final     Results for orders placed or performed during the hospital encounter of 08/10/23 (from the past 48 hours)  Basic metabolic panel with GFR     Status: Abnormal   Collection Time: 08/22/23  2:21 AM  Result Value Ref Range   Sodium 135 135 - 145 mmol/L   Potassium 5.0 3.5 - 5.1 mmol/L   Chloride 97 (L) 98 - 111 mmol/L   CO2 31 22 - 32 mmol/L   Glucose, Bld 98 70 - 99 mg/dL    Comment: Glucose reference range applies only to samples taken after fasting for at least 8 hours.   BUN 10 6 - 20 mg/dL   Creatinine, Ser 9.02 0.44 - 1.00 mg/dL   Calcium  8.9 8.9 - 10.3 mg/dL   GFR, Estimated >39 >39 mL/min    Comment: (NOTE) Calculated using the CKD-EPI Creatinine Equation (2021)    Anion gap 7 5 - 15    Comment: Performed at [facility], [address], [place], [place] [address]  CBC     Status: Abnormal   Collection Time: 08/22/23  2:21 AM  Result Value Ref Range   WBC 9.9 4.0 - 10.5 K/uL   RBC 2.79 (L) 3.87 - 5.11 MIL/uL   Hemoglobin 8.5 (L) 12.0 - 15.0 g/dL   HCT  26.4 (L) 36.0 - 46.0 %   MCV 94.6 80.0 - 100.0 fL   MCH 30.5 26.0 - 34.0 pg   MCHC 32.2 30.0 - 36.0 g/dL   RDW 83.9 (H) 88.4 - 84.4 %   Platelets 247 150 - 400 K/uL   nRBC 0.4 (H) 0.0 - 0.2 %    Comment: Performed at [facility], [address], [place], [place] [address]  Basic metabolic panel with GFR     Status: Abnormal   Collection Time: 08/23/23  3:34 AM  Result Value Ref Range   Sodium 140 135 - 145 mmol/L   Potassium 4.6 3.5 - 5.1 mmol/L   Chloride 100 98 - 111 mmol/L   CO2 28 22 - 32 mmol/L   Glucose, Bld 102 (H) 70 - 99 mg/dL    Comment: Glucose reference range applies only to samples taken after fasting for at least 8 hours.   BUN 11 6 - 20 mg/dL    Creatinine, Ser 8.76 (H) 0.44 - 1.00 mg/dL   Calcium  9.0 8.9 - 10.3 mg/dL   GFR, Estimated 51 (L) >60 mL/min    Comment: (NOTE) Calculated using the CKD-EPI Creatinine Equation (2021)    Anion gap 12 5 - 15    Comment: Performed at [facility], [address] [place], [place] [address]  Magnesium      Status: None   Collection Time: 08/23/23  3:34 AM  Result Value Ref Range   Magnesium  1.7 1.7 - 2.4 mg/dL    Comment: Performed at [facility], [address] [place], [place] [address]    Treatments: surgery:   Operative Report    DATE OF PROCEDURE: 08/16/2023   PREOPERATIVE DIAGNOSES: 1.  Severe three-vessel coronary artery disease. 2.  Previous mitral valve repair.   POSTOPERATIVE DIAGNOSES: 1.  Severe three-vessel coronary artery disease. 2.  Previous mitral valve repair.   PROCEDURE:  Median sternotomy, extracorporeal circulation with femoral venous cannulation, coronary artery bypass grafting x3 (left internal mammary artery to LAD, saphenous vein graft to obtuse marginal 1, saphenous vein graft to posterior descending),  endoscopic vein harvest right leg.   SURGEON:  Elspeth BROCKS. Kerrin, MD.   ASSISTANT:  Lemond Cera, PA-C.  Discharge Exam: Blood pressure (!) 141/96, pulse 84, temperature 98.3 F (36.8 C), temperature source Oral, resp. rate 20, height 5' 1 (1.549 m), weight 48.4 kg, SpO2 100%.    General appearance: alert, cooperative, and no distress Heart: regular rate and rhythm Lungs: min dim in bases Abdomen: benign Extremities: no edema Wound: incis healing well   Discharge Medications:  The patient has been discharged on:   1.Beta Blocker:  Yes [   ]                              No   [n   ]                              If No, reason: Early requirements for inotropic support  2.Ace Inhibitor/ARB: Yes [   ]                                     No  [  n  ]  If No, reason: Advanced heart failure team  has managed postoperative medication regimen.  3.Statin:   Yes [ y  ]                  No  [   ]                  If No, reason:  4.Ecasa:  Yes  [ y  ]                  No   [   ]                  If No, reason:  Patient had ACS upon admission: Non-STEMI  Plavix /P2Y12 inhibitor: Yes [ y  ]                                      No  [   ]     Discharge Instructions     AMB Referral to Cardiac Rehabilitation - Phase II   Complete by: As directed    Diagnosis: CABG   CABG X ___: 3   After initial evaluation and assessments completed: Virtual Based Care may be provided alone or in conjunction with Phase 2 Cardiac Rehab based on patient barriers.: Yes   Intensive Cardiac Rehabilitation (ICR) [facility] location only OR Traditional Cardiac Rehabilitation (TCR) *If criteria for ICR are not met will enroll in TCR ([facility] only): Yes   AMB Referral to [facility] Pharm[facility]   Complete by: As directed    ACS,  See early   Reason For Referral: Lipids      Allergies as of 08/23/2023       Reactions   Aldomet [methyldopa] Other (See Comments)   Severe hypotension   Brilinta  [ticagrelor ] Shortness Of Breath   Other Other (See Comments)   Nuclear Stress Test Medication caused seizures   Coumadin  [warfarin Sodium ] Swelling   Arm swelling   Desyrel [trazodone] Other (See Comments)   Could not wake up from medication, comatosed   Eliquis [apixaban] Other (See Comments)   BP got too low   Entresto  [sacubitril -valsartan ] Hypertension   Hypotension    Zestril  [lisinopril ]    Significant blood pressure fluctuations    Penicillins Rash        Medication List     STOP taking these medications    amLODipine  10 MG tablet Commonly known as: NORVASC    atorvastatin  80 MG tablet Commonly known as: LIPITOR    cyclobenzaprine  10 MG tablet Commonly known as: FLEXERIL    furosemide  40 MG tablet Commonly known as: LASIX    losartan  100 MG tablet Commonly known as: COZAAR    meloxicam 15 MG  tablet Commonly known as: MOBIC   metoprolol  succinate 50 MG 24 hr tablet Commonly known as: TOPROL -XL   Nexletol  180 MG Tabs Generic drug: Bempedoic Acid        TAKE these medications    acetaminophen  325 MG tablet Commonly known as: TYLENOL  Take 2 tablets (650 mg total) by mouth every 6 (six) hours as needed.   aspirin  EC 81 MG tablet Take 81 mg by mouth daily.   budesonide -glycopyrrolate -formoterol  160-9-4.8 MCG/ACT Aero inhaler Commonly known as: BREZTRI  Inhale 2 puffs into the lungs 2 (two) times daily.   clopidogrel  75 MG tablet Commonly known as: PLAVIX  TAKE 1 TABLET BY MOUTH DAILY   empagliflozin  10 MG Tabs tablet Commonly known  as: JARDIANCE  Take 1 tablet (10 mg total) by mouth daily.   ezetimibe  10 MG tablet Commonly known as: ZETIA  Take 1 tablet (10 mg total) by mouth daily.   Fe Fum-Vit C-Vit B12-FA Caps capsule Commonly known as: TRIGELS-F FORTE Take 1 capsule by mouth daily after breakfast.   magnesium  gluconate 500 (27 Mg) MG Tabs tablet Commonly known as: MAGONATE Take 1 tablet (500 mg total) by mouth daily.   oxyCODONE  5 MG immediate release tablet Commonly known as: Oxy IR/ROXICODONE  Take 1 tablet (5 mg total) by mouth every 6 (six) hours as needed for up to 7 days for severe pain (pain score 7-10).   rosuvastatin  40 MG tablet Commonly known as: CRESTOR  Take 1 tablet (40 mg total) by mouth daily.   SALONPAS PAIN RELIEF PATCH EX Apply 1 patch topically daily as needed (pain).   spironolactone  25 MG tablet Commonly known as: ALDACTONE  Take 0.5 tablets (12.5 mg total) by mouth daily.   Ventolin  HFA 108 (90 Base) MCG/ACT inhaler Generic drug: albuterol  Inhale 1-2 puffs into the lungs every 4 (four) hours as needed for shortness of breath.        Follow-up Information     Jeffrie Oneil BROCKS, MD. Go on 10/09/2023.   Specialty: Cardiology Why: Your appointment is at 11:20am. Contact information: [address] [place]  [address] [phone]         Norrine Sharper, MD Follow up.   Specialty: Internal Medicine Why: appt scheduled 08/29/2023 at 1:45 pm, arrive at 1:30 pm, you will see Lonni Africa MD. Contact information: [address] [place] [address] [phone]         West, Katlyn D, NP. Go on 09/05/2023.   Specialty: Cardiology Why: Your appointment is at 2:20pm. Contact information: [address] [place] [address] [phone]         Rutha Manuelita HERO, PA-C. Go on 09/18/2023.   Specialties: Physician Assistant, Thoracic Surgery Why: Your appointment is at 11:00am. Contact information: [address] [place] [address] [phone]         [facility] Follow up.   Why: Home Health RN and Physical Therapy-agency will call to arrange appt Contact information: [address] [place] [address] [phone]                 Signed:  Lemond FORBES Cera, PA-C  08/23/2023, 9:34 AM

## 2023-08-20 NOTE — Progress Notes (Signed)
 Patient ambulated 2 blocks without difficulty.  She did require encouragement.  Has had some sternal and rib pain during shift and medicated accordingly.  Up to chair after walk.

## 2023-08-20 NOTE — Evaluation (Signed)
 Physical Therapy Evaluation Patient Details Name: Lindsay Camacho MRN: [id number] DOB: July 21, 1965 Today's Date: 08/20/2023  History of Present Illness  58 yo female s/p 8/14 CABG x3 harvested R greater saphenous vein PMH MI, heart failure, HLD, HTN, LBBB s/p PPM, PVD, TIA.  Clinical Impression  Pt admitted with above diagnosis. Pt was able to ambulate on unit with CGA.  Pt progressing. Needing cues for sternal precautions.  Pt currently with functional limitations due to the deficits listed below (see PT Problem List). Pt will benefit from acute skilled PT to increase their independence and safety with mobility to allow discharge.            If plan is discharge home, recommend the following: Assistance with cooking/housework;Assist for transportation;Help with stairs or ramp for entrance   Can travel by private vehicle        Equipment Recommendations None recommended by PT  Recommendations for Other Services       Functional Status Assessment Patient has had a recent decline in their functional status and demonstrates the ability to make significant improvements in function in a reasonable and predictable amount of time.     Precautions / Restrictions Precautions Precautions: Sternal Recall of Precautions/Restrictions: Intact Precaution/Restrictions Comments: handout provided and reviewed for sternal precautions with adls. Restrictions Weight Bearing Restrictions Per Provider Order: Yes RUE Weight Bearing Per Provider Order: Non weight bearing LUE Weight Bearing Per Provider Order: Non weight bearing      Mobility  Bed Mobility               General bed mobility comments: oob on arrival in chair    Transfers Overall transfer level: Needs assistance   Transfers: Sit to/from Stand Sit to Stand: Contact guard assist           General transfer comment: Cues for hand placement and for sternal precautions.    Ambulation/Gait Ambulation/Gait assistance: Contact  guard assist Gait Distance (Feet): 370 Feet Assistive device: Rolling walker (2 wheels) Gait Pattern/deviations: WFL(Within Functional Limits)   Gait velocity interpretation: 1.31 - 2.62 ft/sec, indicative of limited community ambulator   General Gait Details: Pt was able to ambulate on unit with RW with CGA No LOB.  Stairs            Wheelchair Mobility     Tilt Bed    Modified Rankin (Stroke Patients Only)       Balance Overall balance assessment: Independent                                           Pertinent Vitals/Pain Pain Assessment Pain Assessment: No/denies pain    Home Living Family/patient expects to be discharged to:: Private residence Living Arrangements: Children Available Help at Discharge: Family;Available PRN/intermittently (reports daughter is minimal (A); aunt can provide intermittent assist) Type of Home: House Home Access: Stairs to enter Entrance Stairs-Rails: None Entrance Stairs-Number of Steps: 1   Home Layout: One level Home Equipment: Agricultural consultant (2 wheels) Additional Comments: reports cat down stairs only and daughte rwill manage    Prior Function Prior Level of Function : Independent/Modified Independent                     Extremity/Trunk Assessment   Upper Extremity Assessment Upper Extremity Assessment: Defer to OT evaluation    Lower Extremity Assessment Lower Extremity Assessment: Overall WFL for  tasks assessed    Cervical / Trunk Assessment Cervical / Trunk Assessment: Normal  Communication   Communication Communication: No apparent difficulties    Cognition Arousal: Alert Behavior During Therapy: Flat affect                             Following commands: Intact       Cueing Cueing Techniques: Verbal cues     General Comments General comments (skin integrity, edema, etc.): VSS on 2L, 113/59, 84 bpm    Exercises General Exercises - Lower Extremity Ankle  Circles/Pumps: AROM, Both, 10 reps, Supine Long Arc Quad: AROM, Both, 10 reps, Seated Hip Flexion/Marching: AROM, Both, 10 reps, Seated   Assessment/Plan    PT Assessment Patient needs continued PT services  PT Problem List Decreased activity tolerance;Decreased balance;Decreased mobility;Decreased knowledge of use of DME;Decreased safety awareness;Decreased knowledge of precautions       PT Treatment Interventions DME instruction;Gait training;Functional mobility training;Therapeutic activities;Therapeutic exercise;Balance training;Patient/family education;Stair training    PT Goals (Current goals can be found in the Care Plan section)  Acute Rehab PT Goals Patient Stated Goal: to go home PT Goal Formulation: With patient Time For Goal Achievement: 09/03/23 Potential to Achieve Goals: Good    Frequency Min 2X/week     Co-evaluation               AM-PAC PT 6 Clicks Mobility  Outcome Measure Help needed turning from your back to your side while in a flat bed without using bedrails?: A Little Help needed moving from lying on your back to sitting on the side of a flat bed without using bedrails?: A Little Help needed moving to and from a bed to a chair (including a wheelchair)?: A Little Help needed standing up from a chair using your arms (e.g., wheelchair or bedside chair)?: A Little Help needed to walk in hospital room?: A Little Help needed climbing 3-5 steps with a railing? : A Lot 6 Click Score: 17    End of Session Equipment Utilized During Treatment: Gait belt;Oxygen Activity Tolerance: Patient limited by fatigue Patient left: in chair;with call bell/phone within reach;with chair alarm set Nurse Communication: Mobility status PT Visit Diagnosis: Muscle weakness (generalized) (M62.81)    Time: 1130-1156 PT Time Calculation (min) (ACUTE ONLY): 26 min   Charges:   PT Evaluation $PT Eval Moderate Complexity: 1 Mod PT Treatments $Gait Training: 8-22 mins PT  General Charges $$ ACUTE PT VISIT: 1 Visit         Latoya Diskin M,PT Acute Rehab Services [phone]   [name] 08/20/2023, 4:14 PM

## 2023-08-20 NOTE — Progress Notes (Addendum)
 [facility] DAILY ICU PROGRESS NOTE                   [address]            [place] [address]          [phone]   4 Days Post-Op Procedure(s) (LRB): CORONARY ARTERY BYPASS GRAFTING (CABG) TIMES THREE USING LEFT INTERNAL MAMMARY ARTERY AND ENDOSCOPICALLY HARVESTED RIGHT GREATER SAPHENOUS VEIN (N/A) ECHOCARDIOGRAM, TRANSESOPHAGEAL (N/A)  Total Length of Stay:  LOS: 10 days   Subjective: Feels nauseated  Objective: Vital signs in last 24 hours: Temp:  [97.6 F (36.4 C)-97.9 F (36.6 C)] 97.8 F (36.6 C) (08/18 0300) Pulse Rate:  [82-118] 86 (08/18 0600) Cardiac Rhythm: A-V Sequential paced (08/17 1946) Resp:  [12-25] 12 (08/18 0600) BP: (95-154)/(59-94) 135/90 (08/18 0600) SpO2:  [72 %-100 %] 97 % (08/18 0600) Weight:  [50.7 kg] 50.7 kg (08/18 0500)  Filed Weights   08/18/23 0600 08/19/23 0500 08/20/23 0500  Weight: 54.9 kg 48.9 kg 50.7 kg    Weight change: 1.8 kg   Hemodynamic parameters for last 24 hours:    Intake/Output from previous day: 08/17 0701 - 08/18 0700 In: 472.1 [I.V.:425.6; IV Piggyback:46.5] Out: 205 [Urine:205]  Intake/Output this shift: No intake/output data recorded.  Current Meds: Scheduled Meds:  acetaminophen   650 mg Oral QID   aspirin  EC  325 mg Oral Daily   Or   aspirin   324 mg Per Tube Daily   bisacodyl   10 mg Oral Daily   Or   bisacodyl   10 mg Rectal Daily   Chlorhexidine  Gluconate Cloth  6 each Topical Daily   digoxin   0.125 mg Oral Daily   docusate sodium   200 mg Oral Daily   ezetimibe   10 mg Oral Daily   Fe Fum-Vit C-Vit B12-FA  1 capsule Oral QPC breakfast   lidocaine   1 patch Transdermal Q24H   pantoprazole   40 mg Oral Daily   polyethylene glycol  17 g Oral BID   rosuvastatin   40 mg Oral Daily   sodium chloride  flush  3 mL Intravenous Q12H   spironolactone   25 mg Oral Daily   Continuous Infusions:  amiodarone  30 mg/hr (08/20/23 0600)   PRN Meds:.albuterol , ondansetron  (ZOFRAN ) IV, mouth rinse,  oxyCODONE , sodium chloride  flush, traMADol   General appearance: alert, cooperative, distracted, fatigued, and no distress Heart: regular rate and rhythm and occas irreg beat Lungs: dim in bases Abdomen: soft, non-tender or distended Extremities: no edema Wound: incis healing well  Lab Results: CBC: Recent Labs    08/19/23 0419 08/20/23 0319  WBC 7.0 10.8*  HGB 7.5* 8.8*  HCT 23.1* 27.1*  PLT 76* 137*   BMET:  Recent Labs    08/19/23 0419 08/20/23 0319  NA 136 135  K 4.0 3.7  CL 98 98  CO2 29 27  GLUCOSE 81 107*  BUN 10 16  CREATININE 0.86 0.98  CALCIUM  8.3* 8.4*    CMET: Lab Results  Component Value Date   WBC 10.8 (H) 08/20/2023   HGB 8.8 (L) 08/20/2023   HCT 27.1 (L) 08/20/2023   PLT 137 (L) 08/20/2023   GLUCOSE 107 (H) 08/20/2023   CHOL 221 (H) 08/11/2023   TRIG 51 08/11/2023   HDL 49 08/11/2023   LDLCALC 162 (H) 08/11/2023   ALT 13 08/10/2023   AST 25 08/10/2023   NA 135 08/20/2023   K 3.7 08/20/2023   CL 98 08/20/2023   CREATININE 0.98 08/20/2023  BUN 16 08/20/2023   CO2 27 08/20/2023   TSH 1.700 01/21/2017   INR 1.9 (H) 08/16/2023   HGBA1C 5.1 08/16/2023      PT/INR: No results for input(s): LABPROT, INR in the last 72 hours. Radiology: No results found.   Assessment/Plan: S/P Procedure(s) (LRB): CORONARY ARTERY BYPASS GRAFTING (CABG) TIMES THREE USING LEFT INTERNAL MAMMARY ARTERY AND ENDOSCOPICALLY HARVESTED RIGHT GREATER SAPHENOUS VEIN (N/A) ECHOCARDIOGRAM, TRANSESOPHAGEAL (N/A) POD#4  1 afeb, AV seq paced,had some afib yesterday s BP 95-150's, gtts- amio gtt, dig also started, will switch to po amio 2 O2 sats good on 3 liters 3 voiding- not all recorded, weight approx at preop, normal renal fxn 4 expected ABLA- improved trend w/ equilibration, on Iron supp 5 thrombocytopenia trend improved- likely reactive 6 PT/OT ordered 7 push nutrition as able 8 pulm hygiene as able  Lemond FORBES Cera PA-C Pager [phone]  08/20/2023  7:14 AM   Chart reviewed, patient examined, agree with above.  She is hemodynamically stable and back in sinus rhythm on amio. Switched to po this am. DDD paced with PPM.  Continuing diuresis per AHF team.  She ambulated around the ICU this morning. Will continue ambulating today and hopefully transfer to 4E tomorrow. She will not move without a lot of encouragement.

## 2023-08-20 NOTE — Plan of Care (Signed)
  Problem: Urinary Elimination: Goal: Ability to achieve and maintain adequate renal perfusion and functioning will improve Outcome: Progressing   Problem: Fluid Volume: Goal: Ability to maintain a balanced intake and output will improve Outcome: Progressing   Problem: Skin Integrity: Goal: Risk for impaired skin integrity will decrease Outcome: Progressing

## 2023-08-20 NOTE — Consult Note (Signed)
 NAME:  Lindsay Camacho, MRN:  [id number], DOB:  1965-08-30, LOS: 10 ADMISSION DATE:  08/10/2023, CONSULTATION DATE:  08/20/23 REFERRING MD:  Kerrin, CHIEF COMPLAINT:  CABG   History of Present Illness:  Lindsay Camacho is a 58 y.o. F with PMH significant for CAD with prior stenting, HL, HTN, ICD and previous MV repair, who presented with chest pain to OSH in [place] and went into Vfib while in the ED with ICD shock.   She underwent LHC which showed 3V disease and was transferred to [facility] for further evaluation.   On 8/14, she underwent CABG x3 (LIMA to LAD, saphenous vein graft to obtuse marginal and posterior descending).  Intraoperatively she was noted to have severe intrapericardial adhesions so femoral venous cannulation was performed.  The patient was transferred to the ICU post-op and extubated later in the day.    POD #1 she has refused some of her po medications and is complaining significant sternotomy discomfort. Remains on Milrinone  0.125 Norepi .  She has been somewhat somnolent at time post-intubation   Pertinent  Medical History   has a past medical history of Acute myocardial infarction of other lateral wall, initial episode of care (01/2013), Aortoiliac occlusive disease (HCC) (08/22/2016), CAD (coronary artery disease) (11/13/2011), Chest pain, atypical, muscular skelatal   (01/09/2014), COVID (02/08/2020), Dyspnea, Heart failure (HCC), HLD (hyperlipidemia), Hypertension, Intermediate coronary syndrome (HCC), LBBB (left bundle branch block), Mitral regurgitation, Nonischemic cardiomyopathy (HCC) (01/26/2017), Presence of combination internal cardiac defibrillator (ICD) and pacemaker, PVD (peripheral vascular disease) (HCC), 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   Significant Hospital Events: Including procedures, antibiotic start and stop dates in addition to other pertinent events   8/14 3v  CABG and extubation 8/15 remains on milrinone  and low dose Norepi  Interim History / Subjective:  Lindsay Camacho still has nausea. She has walked one small loop. She is off inotropes, remains on amiodarone .    Objective    Blood pressure (!) 135/90, pulse 86, temperature 97.8 F (36.6 C), temperature source Oral, resp. rate 12, height 5' 1 (1.549 m), weight 50.7 kg, SpO2 97%.        Intake/Output Summary (Last 24 hours) at 08/20/2023 0723 Last data filed at 08/20/2023 0600 Gross per 24 hour  Intake 472.08 ml  Output 205 ml  Net 267.08 ml   Filed Weights   08/18/23 0600 08/19/23 0500 08/20/23 0500  Weight: 54.9 kg 48.9 kg 50.7 kg    General:  chronically ill appearing woman sitting up in bed in NAD HEENT: Lindsay Camacho, eyes anicteric Neuro: awake, lethargic appearing, moving extremities. Answering questions appropriately.  CV: s1s2 rrr, no m/r/g, sternotomy dressing c/d/I  PULM:  shallow inspirations with decreased air entry in the bilateral bases  GI: soft, non-tender  Extremities: warm/dry, no edema   BUN 16 Cr 0.98 WBC 10.8 H/H 8.8/27.1 Platelets 137 CXR personally reviewed> small dependent R effusion, fluid in left fissure, atelectasis in left base   Resolved problem list   Assessment and Plan   3V CAD s/p CABG - LIMA to LAD, saphenous vein graft to obtuse marginal and posterior descending with significant intrapericardial adhesions. Pre-op  LVEF 55-60%,  Post-op cardiogenic shock -aspirin , crestor  -metoprolol  on hold due to heart failure -ambulate, progress diet as able -pain control per protocol -pulmonary hygiene -con't to monitor off inotropes -con't digoxin , spironolactone    Nausea post op -zofran  PRN  Acute respiratory failure with hypoxia, R pleural effusion. Had  slower vent wean. Suspect chronic COPD with history of wheezing and tobacco use PTA -lasix  40mg  today -IS-- encouraged her to use it more  -wean supplemental O2 as able for SpO2> 90% -adding  yupelri  and brovana ; con't albuterol  PRN  HTN -hold PTA losartan  & Bblocker  HLD  -rosuvastatin   Expected post-op ABLA, thrombocytopenia  -iron supplements -transfuse for Hb <7 or hemodynamically significant bleeding, monitor -ok to add back DVT prophylaxis  Hyperglycemia; controlled -SSI PRN -goal BG 140-180  Tobacco abuse -has already been off nicotine  replacement therapy about week; would only use symptom triggered therapy at this point. She reports that nicotine  patches were what she wanted to use PTA, but they were too expensive -encouraged cessation -consider lung cancer screening CT referral as OP  Stable to transfer out of ICU. PCCM will be available as needed.   Best Practice (right click and Reselect all SmartList Selections daily)   Diet/type: Regular consistency (see orders) DVT prophylaxis LMWH Pressure ulcer(s): N/A GI prophylaxis: N/A Lines: N/A Foley:  N/A Code Status:  full code Last date of multidisciplinary goals of care discussion [per primary]  Labs   CBC: Recent Labs  Lab 08/17/23 0441 08/17/23 0442 08/17/23 1601 08/17/23 1630 08/18/23 0414 08/19/23 0419 08/20/23 0319  WBC 10.7*  --  14.6*  --  13.9* 7.0 10.8*  HGB 8.1*   < > 7.8* 7.8* 7.8* 7.5* 8.8*  HCT 23.6*   < > 23.1* 23.0* 23.4* 23.1* 27.1*  MCV 88.7  --  89.9  --  91.1 93.1 93.4  PLT 76*  --  92*  --  100* 76* 137*   < > = values in this interval not displayed.    Basic Metabolic Panel: Recent Labs  Lab 08/16/23 1926 08/16/23 2040 08/17/23 0441 08/17/23 0442 08/17/23 1539 08/17/23 1601 08/17/23 1630 08/18/23 0414 08/19/23 0419 08/19/23 0921 08/20/23 0319  NA 138   < > 141   < >  --  135 138 133* 136  --  135  K 4.2   < > 4.3   < >  --  3.9 4.0 3.8 4.0  --  3.7  CL 104  --  106  --   --  101  --  99 98  --  98  CO2 24  --  25  --   --  26  --  26 29  --  27  GLUCOSE 130*  --  108*  --   --  121*  --  121* 81  --  107*  BUN 11  --  10  --   --  10  --  9 10  --  16   CREATININE 0.96  --  0.90  --   --  0.91  --  1.03* 0.86  --  0.98  CALCIUM  8.7*  --  8.3*  --   --  8.2*  --  8.2* 8.3*  --  8.4*  MG 3.4*  --  2.1  --  1.6*  --   --  1.4*  --  1.8  --   PHOS  --   --   --   --   --   --   --  3.0  --   --   --    < > = values in this interval not displayed.       Critical care time:      Leita SHAUNNA Gaskins, DO 08/20/23 9:28 AM [facility]  For contact information, see Amion. If no response to pager, please call PCCM consult pager. After hours, 7PM- 7AM, please call Elink.

## 2023-08-21 ENCOUNTER — Inpatient Hospital Stay (HOSPITAL_COMMUNITY)

## 2023-08-21 DIAGNOSIS — R739 Hyperglycemia, unspecified: Secondary | ICD-10-CM | POA: Diagnosis not present

## 2023-08-21 DIAGNOSIS — R579 Shock, unspecified: Secondary | ICD-10-CM | POA: Diagnosis not present

## 2023-08-21 DIAGNOSIS — J9601 Acute respiratory failure with hypoxia: Secondary | ICD-10-CM | POA: Diagnosis not present

## 2023-08-21 DIAGNOSIS — I214 Non-ST elevation (NSTEMI) myocardial infarction: Secondary | ICD-10-CM | POA: Diagnosis not present

## 2023-08-21 DIAGNOSIS — Z951 Presence of aortocoronary bypass graft: Secondary | ICD-10-CM

## 2023-08-21 LAB — BASIC METABOLIC PANEL WITH GFR
Anion gap: 7 (ref 5–15)
BUN: 11 mg/dL (ref 6–20)
CO2: 31 mmol/L (ref 22–32)
Calcium: 8.4 mg/dL — ABNORMAL LOW (ref 8.9–10.3)
Chloride: 94 mmol/L — ABNORMAL LOW (ref 98–111)
Creatinine, Ser: 1.04 mg/dL — ABNORMAL HIGH (ref 0.44–1.00)
GFR, Estimated: >60 mL/min (ref >60)
Glucose, Bld: 114 mg/dL — ABNORMAL HIGH (ref 70–99)
Potassium: 3.9 mmol/L (ref 3.5–5.1)
Sodium: 132 mmol/L — ABNORMAL LOW (ref 135–145)

## 2023-08-21 LAB — CBC
HCT: 25.6 % — ABNORMAL LOW (ref 36.0–46.0)
Hemoglobin: 8.1 g/dL — ABNORMAL LOW (ref 12.0–15.0)
MCH: 29.9 pg (ref 26.0–34.0)
MCHC: 31.6 g/dL (ref 30.0–36.0)
MCV: 94.5 fL (ref 80.0–100.0)
Platelets: 199 K/uL (ref 150–400)
RBC: 2.71 MIL/uL — ABNORMAL LOW (ref 3.87–5.11)
RDW: 15.2 % (ref 11.5–15.5)
WBC: 10.7 K/uL — ABNORMAL HIGH (ref 4.0–10.5)
nRBC: 0.2 % (ref 0.0–0.2)

## 2023-08-21 MED ORDER — IOHEXOL 350 MG/ML SOLN
75.0000 mL | Freq: Once | INTRAVENOUS | Status: AC | PRN
  Administered 2023-08-21: 75 mL via INTRAVENOUS

## 2023-08-21 MED ORDER — POTASSIUM CHLORIDE CRYS ER 20 MEQ PO TBCR: 40.0000 meq | EXTENDED_RELEASE_TABLET | Freq: Once | ORAL | Status: DC

## 2023-08-21 MED ORDER — DIPHENHYDRAMINE HCL 25 MG PO CAPS
25.0000 mg | ORAL_CAPSULE | Freq: Every evening | ORAL | Status: DC | PRN
  Administered 2023-08-21 – 2023-08-22 (×2): 25 mg via ORAL
  Filled 2023-08-21 (×2): qty 1

## 2023-08-21 NOTE — Progress Notes (Addendum)
 Advanced Heart Failure Rounding Note   Subjective:    POD #5 CABG x 3  Limited echo 8/28 EF 60-65%, D-shaped septum, RV mildly enlarged w/ normal SFx, severe TR, mitral valve ok.   CT done today to r/o PE given abnormal echo. Per preliminary review, negative for PE.   Says she feels rough. Tired and SOB w/ pleuritic CP though No increased O2 requirements, currently on 1 L/min.   Didn't sleep well last night.    Objective:   Weight Range:  Vital Signs:   Temp:  [97.5 F (36.4 C)-98.3 F (36.8 C)] 97.5 F (36.4 C) (08/19 0700) Pulse Rate:  [83-107] 84 (08/19 0800) Resp:  [12-27] 18 (08/19 0800) BP: (83-137)/(59-104) 94/75 (08/19 0800) SpO2:  [97 %-100 %] 100 % (08/19 0800) Weight:  [49.5 kg] 49.5 kg (08/19 0545) Last BM Date : 08/19/23  Weight change: Filed Weights   08/19/23 0500 08/20/23 0500 08/21/23 0545  Weight: 48.9 kg 50.7 kg 49.5 kg    Intake/Output:   Intake/Output Summary (Last 24 hours) at 08/21/2023 0958 Last data filed at 08/21/2023 0800 Gross per 24 hour  Intake 867.13 ml  Output 650 ml  Net 217.13 ml    GENERAL: fatigued appearing, no distress  Lungs- decreased at the bases bilaterally  CARDIAC:  JVP: TR CV waves ~7 c, RRR, 2/6 TR murmur LLSB, no LEE ABDOMEN: soft, ND  EXTREMITIES: warm and well perfused    Telemetry: a-v paced 80s, Personally reviewed  Labs: Basic Metabolic Panel: Recent Labs  Lab 08/16/23 1926 08/16/23 2040 08/17/23 0441 08/17/23 0442 08/17/23 1539 08/17/23 1601 08/17/23 1630 08/18/23 0414 08/19/23 0419 08/19/23 0921 08/20/23 0319  NA 138   < > 141   < >  --  135 138 133* 136  --  135  K 4.2   < > 4.3   < >  --  3.9 4.0 3.8 4.0  --  3.7  CL 104  --  106  --   --  101  --  99 98  --  98  CO2 24  --  25  --   --  26  --  26 29  --  27  GLUCOSE 130*  --  108*  --   --  121*  --  121* 81  --  107*  BUN 11  --  10  --   --  10  --  9 10  --  16  CREATININE 0.96  --  0.90  --   --  0.91  --  1.03* 0.86  --   0.98  CALCIUM  8.7*  --  8.3*  --   --  8.2*  --  8.2* 8.3*  --  8.4*  MG 3.4*  --  2.1  --  1.6*  --   --  1.4*  --  1.8  --   PHOS  --   --   --   --   --   --   --  3.0  --   --   --    < > = values in this interval not displayed.    Liver Function Tests: No results for input(s): AST, ALT, ALKPHOS, BILITOT, PROT, ALBUMIN  in the last 168 hours. No results for input(s): LIPASE, AMYLASE in the last 168 hours. No results for input(s): AMMONIA in the last 168 hours.  CBC: Recent Labs  Lab 08/17/23 0441 08/17/23 0442 08/17/23 1601 08/17/23 1630 08/18/23 0414 08/19/23  9580 08/20/23 0319  WBC 10.7*  --  14.6*  --  13.9* 7.0 10.8*  HGB 8.1*   < > 7.8* 7.8* 7.8* 7.5* 8.8*  HCT 23.6*   < > 23.1* 23.0* 23.4* 23.1* 27.1*  MCV 88.7  --  89.9  --  91.1 93.1 93.4  PLT 76*  --  92*  --  100* 76* 137*   < > = values in this interval not displayed.    Medications:     Scheduled Medications:  acetaminophen   650 mg Oral QID   amiodarone   200 mg Oral BID   arformoterol   15 mcg Nebulization BID   aspirin  EC  325 mg Oral Daily   Or   aspirin   324 mg Per Tube Daily   bisacodyl   10 mg Oral Daily   Or   bisacodyl   10 mg Rectal Daily   Chlorhexidine  Gluconate Cloth  6 each Topical Daily   docusate sodium   200 mg Oral Daily   enoxaparin  (LOVENOX ) injection  40 mg Subcutaneous Daily   ezetimibe   10 mg Oral Daily   Fe Fum-Vit C-Vit B12-FA  1 capsule Oral QPC breakfast   lidocaine   1 patch Transdermal Q24H   pantoprazole   40 mg Oral Daily   polyethylene glycol  17 g Oral BID   revefenacin   175 mcg Nebulization Daily   rosuvastatin   40 mg Oral Daily   sodium chloride  flush  3 mL Intravenous Q12H   spironolactone   25 mg Oral Daily    Infusions:    PRN Medications: albuterol , diphenhydrAMINE , ondansetron  (ZOFRAN ) IV, mouth rinse, oxyCODONE , sodium chloride  flush, traMADol    Assessment/Plan:   Post Cardiotomy Shock  - pre-op  echo 8/10 EF 55-60%, TEE post op showing  moderately reduced EF, mildly reduced RV function - Off milrinone  since 8/16, stable - Repeat limited echo 8/18 EF 60-65%, D-shaped septum, RV mildly enlarged w/ normal SFx, severe TR, mitral valve ok  -volume status ok, back below pre-op  wt - Continue spironolactone  25 mg daily  - Continue digoxin  0.125 mg daily   MVCAD - NSTEMI this admit - s/p CABG x 3 (LIMA-LAD, SVG OM1, SVG-PDA) - continue ASA and statin. Will need Plavix  prior to discharge - remains extremely weak, this will be a barrier to discharge. Continue to ambulate w/ PT today   Atrial fibrillation: Noted after removal of introducer. - A-V paced on tele  - Continue PO amio 200 mg bid  - Hold OAC   VF - shocked by ICD while admitted at [facility], suspect ischemically mediated from obstructive CAD - monitor on tele  - keep K > 4.0 and Mg > 2.0  - rhythm stable - continue PO amio    H/o Mitral Valve Disease - s/p minimally invasive MV repair, via rt thoracotomy in 2018 - stable valve ring on pre-CABG TEE, mild MR and mild AS  - stable on Limited echo 8/18   Tricuspid Regurgitation  - severe on limited echo 8/18  - RV-RA peak gradient 21 mmHg  - can follow as outpatient   Hypomag/hypokalemia - Mg 1.8, K 3.7  - supp, d/w pharmD   Encouraged continued ambulation. CT surgery to determine timing of transfer to PCU.    Length of Stay: [address] Lindsay Camacho  08/21/2023, 9:58 AM  Advanced Heart Failure Team Pager [phone] (M-F; 7a - 4p)   Patient seen and examined with the above-signed Advanced Practice Provider and/or Housestaff. I personally reviewed laboratory data, imaging studies and  relevant notes. I independently examined the patient and formulated the important aspects of the plan. I have edited the note to reflect any of my changes or salient points. I have personally discussed the plan with the patient and/or family.  Continue to progress slowly. Echo with LVEF 60-65% RV dialted with normal  function. There is severe TR with RV strain but RVSP not markedly elevated. CT chest no PE + evidence of eleevated R-sided pressures  General:  Elderly  No resp difficulty HEENT: normal Neck: supple JVP 8-10 Cor:  Regular rate & rhythm. 2/6 TR Lungs: clear Abdomen: soft, nontender, nondistended. No hepatosplenomegaly. No bruits or masses. Good bowel sounds. Extremities: no cyanosis, clubbing, rash, tredema Neuro: alert & orientedx3, cranial nerves grossly intact. moves all 4 extremities w/o difficulty. Affect pleasant  She is making slow progress post-op. Echo suggestive of RV strain but no PE on CT. Continue  Continue with current plan.   Lindsay Fuel, MD  10:07 PM

## 2023-08-21 NOTE — Progress Notes (Addendum)
 RN called phlebotomy and informed them that 0500 labs still need to be collected on patient.

## 2023-08-21 NOTE — Progress Notes (Addendum)
 TCTS DAILY ICU PROGRESS NOTE                   [address]            [place] [address]          [phone]   5 Days Post-Op Procedure(s) (LRB): CORONARY ARTERY BYPASS GRAFTING (CABG) TIMES THREE USING LEFT INTERNAL MAMMARY ARTERY AND ENDOSCOPICALLY HARVESTED RIGHT GREATER SAPHENOUS VEIN (N/A) ECHOCARDIOGRAM, TRANSESOPHAGEAL (N/A)  Total Length of Stay:  LOS: 11 days   Subjective: Didn't sleep well, requests benadryl   Objective: Vital signs in last 24 hours: Temp:  [97.6 F (36.4 C)-98.3 F (36.8 C)] 98.2 F (36.8 C) (08/19 0545) Pulse Rate:  [81-107] 86 (08/19 0600) Cardiac Rhythm: A-V Sequential paced (08/19 0400) Resp:  [12-25] 21 (08/19 0600) BP: (83-137)/(59-104) 83/63 (08/19 0600) SpO2:  [88 %-100 %] 100 % (08/19 0600) Weight:  [49.5 kg] 49.5 kg (08/19 0545)  Filed Weights   08/19/23 0500 08/20/23 0500 08/21/23 0545  Weight: 48.9 kg 50.7 kg 49.5 kg    Weight change: -1.2 kg   Hemodynamic parameters for last 24 hours:    Intake/Output from previous day: 08/18 0701 - 08/19 0700 In: 1347.1 [P.O.:1308; I.V.:39.1] Out: 650 [Urine:650]  Intake/Output this shift: No intake/output data recorded.  Current Meds: Scheduled Meds:  acetaminophen   650 mg Oral QID   amiodarone   200 mg Oral BID   arformoterol   15 mcg Nebulization BID   aspirin  EC  325 mg Oral Daily   Or   aspirin   324 mg Per Tube Daily   bisacodyl   10 mg Oral Daily   Or   bisacodyl   10 mg Rectal Daily   Chlorhexidine  Gluconate Cloth  6 each Topical Daily   docusate sodium   200 mg Oral Daily   enoxaparin  (LOVENOX ) injection  40 mg Subcutaneous Daily   ezetimibe   10 mg Oral Daily   Fe Fum-Vit C-Vit B12-FA  1 capsule Oral QPC breakfast   lidocaine   1 patch Transdermal Q24H   pantoprazole   40 mg Oral Daily   polyethylene glycol  17 g Oral BID   revefenacin   175 mcg Nebulization Daily   rosuvastatin   40 mg Oral Daily   sodium chloride  flush  3 mL Intravenous Q12H    spironolactone   25 mg Oral Daily   Continuous Infusions: PRN Meds:.albuterol , ondansetron  (ZOFRAN ) IV, mouth rinse, oxyCODONE , sodium chloride  flush, traMADol   General appearance: alert, cooperative, and no distress Heart: regular rate and rhythm Lungs: dim in bases Abdomen: benign Extremities: no edema Wound: incis healing well  Lab Results: CBC: Recent Labs    08/19/23 0419 08/20/23 0319  WBC 7.0 10.8*  HGB 7.5* 8.8*  HCT 23.1* 27.1*  PLT 76* 137*   BMET:  Recent Labs    08/19/23 0419 08/20/23 0319  NA 136 135  K 4.0 3.7  CL 98 98  CO2 29 27  GLUCOSE 81 107*  BUN 10 16  CREATININE 0.86 0.98  CALCIUM  8.3* 8.4*    CMET: Lab Results  Component Value Date   WBC 10.8 (H) 08/20/2023   HGB 8.8 (L) 08/20/2023   HCT 27.1 (L) 08/20/2023   PLT 137 (L) 08/20/2023   GLUCOSE 107 (H) 08/20/2023   CHOL 221 (H) 08/11/2023   TRIG 51 08/11/2023   HDL 49 08/11/2023   LDLCALC 162 (H) 08/11/2023   ALT 13 08/10/2023   AST 25 08/10/2023   NA 135 08/20/2023   K 3.7 08/20/2023  CL 98 08/20/2023   CREATININE 0.98 08/20/2023   BUN 16 08/20/2023   CO2 27 08/20/2023   TSH 1.700 01/21/2017   INR 1.9 (H) 08/16/2023   HGBA1C 5.1 08/16/2023      PT/INR: No results for input(s): LABPROT, INR in the last 72 hours. Radiology: ECHOCARDIOGRAM LIMITED Result Date: 08/20/2023    ECHOCARDIOGRAM LIMITED REPORT   Patient Name:   Lindsay Camacho Date of Exam: 08/20/2023 Medical Rec #:  [id number]     Height:       61.0 in Accession #:    [id number]    Weight:       111.8 lb Date of Birth:  [birth year]     BSA:          1.475 m Patient Age:    58 years      BP:           116/70 mmHg Patient Gender: F             HR:           84 bpm. Exam Location:  Inpatient Procedure: Cardiac Doppler, Limited Color Doppler and Limited Echo (Both            Spectral and Color Flow Doppler were utilized during procedure). Indications:     CAD native vessel I25.10  History:         Patient has prior history of  Echocardiogram examinations, most                  recent 08/16/2023. CHF, CAD and Previous Myocardial Infarction,                  Prior CABG, Pacemaker and Defibrillator, Mitral Ring valve                  present in mitral position. Procedure date: 08/25/2016; Risk                  Factors:Dyslipidemia and Hypertension.  Sonographer:     Koleen Popper RDCS Referring Phys:  [id number] DANIEL R BENSIMHON Diagnosing Phys: Ezra Kanner  Sonographer Comments: Image acquisition challenging due to patient body habitus and Image acquisition challenging due to respiratory motion. IMPRESSIONS  1. Left ventricular ejection fraction, by estimation, is 60 to 65%. The left ventricle has normal function. The left ventricle has no regional wall motion abnormalities. There is mild concentric left ventricular hypertrophy. Left ventricular diastolic parameters are indeterminate.  2. D-shaped septum suggestive of RV pressure/volume overload. Peak RV-RA gradient 21 mmHg. Right ventricular systolic function is normal. The right ventricular size is mildly enlarged. The RV was not well-visualized.  3. Status post mitral valve repair. Mean gradient 4 mmHg suggests that there is not significant mitral stenosis present. No significant mitral regurgitation noted.  4. The tricuspid valve is abnormal. Tricuspid valve regurgitation is severe, ?impingement from ICD but tricuspid valve was not well-visualized.  5. The aortic valve is tricuspid. Aortic valve regurgitation is mild. No aortic stenosis is present.  6. IVC not visualized.  7. A small pericardial effusion is present.  8. Technically difficult study with poor windows. No subcostal views. FINDINGS  Left Ventricle: Left ventricular ejection fraction, by estimation, is 60 to 65%. The left ventricle has normal function. The left ventricle has no regional wall motion abnormalities. The left ventricular internal cavity size was normal in size. There is  mild concentric left ventricular  hypertrophy. Left ventricular diastolic parameters are indeterminate. Right Ventricle: D-shaped septum  suggestive of RV pressure/volume overload. Peak RV-RA gradient 21 mmHg. The right ventricular size is mildly enlarged. Right vetricular wall thickness was not well visualized. Right ventricular systolic function is normal. Pericardium: A small pericardial effusion is present. Mitral Valve: Status post mitral valve repair. Mean gradient 4 mmHg suggests that there is not significant mitral stenosis present. No significant mitral regurgitation noted. The mean mitral valve gradient is 4.0 mmHg. Tricuspid Valve: The tricuspid valve is abnormal. Tricuspid valve regurgitation is severe. Aortic Valve: The aortic valve is tricuspid. Aortic valve regurgitation is mild. No aortic stenosis is present. Aorta: The aortic root is normal in size and structure. Venous: IVC not visualized. Additional Comments: A device lead is visualized in the right ventricle. Spectral Doppler performed. Color Doppler performed.  LEFT VENTRICLE PLAX 2D LVIDd:         3.90 cm LVIDs:         2.60 cm LV PW:         1.00 cm LV IVS:        1.20 cm LVOT diam:     1.60 cm LV SV:         48 LV SV Index:   33 LVOT Area:     2.01 cm  LEFT ATRIUM         Index LA diam:    3.60 cm 2.44 cm/m  AORTIC VALVE LVOT Vmax:   150.00 cm/s LVOT Vmean:  93.800 cm/s LVOT VTI:    0.239 m  AORTA Ao Root diam: 2.60 cm MITRAL VALVE                TRICUSPID VALVE MV Area (PHT): 3.21 cm     TR Peak grad:   20.8 mmHg MV Mean grad:  4.0 mmHg     TR Vmax:        228.00 cm/s MV Decel Time: 236 msec MV E velocity: 126.00 cm/s  SHUNTS MV A velocity: 116.00 cm/s  Systemic VTI:  0.24 m MV E/A ratio:  1.09         Systemic Diam: 1.60 cm Dalton McleanMD Electronically signed by Ezra Kanner Signature Date/Time: 08/20/2023/8:02:19 PM    Final (Updated)    DG CHEST PORT 1 VIEW Result Date: 08/20/2023 CLINICAL DATA:  [id number] Hypoxia [id number] EXAM: DG CHEST 1V PORT COMPARISON:  Multiple,  most recent August 19, 2023 FINDINGS: Interval removal of the right IJ approach introducer sheath. Persistent small pleural effusions bilaterally with subsegmental atelectasis in the lung bases. No pneumothorax. Moderate cardiomegaly. Left chest pacemaker/AICD with leads terminating in the right atrium and ventricle. Tortuous aorta with aortic atherosclerosis. Sternotomy wires and CABG markers with mitral valve repair. Multilevel thoracic osteophytosis. IMPRESSION: Unchanged small bilateral pleural effusions with bibasilar atelectasis. Electronically Signed   By: Rogelia Myers M.D.   On: 08/20/2023 10:55     Assessment/Plan: S/P Procedure(s) (LRB): CORONARY ARTERY BYPASS GRAFTING (CABG) TIMES THREE USING LEFT INTERNAL MAMMARY ARTERY AND ENDOSCOPICALLY HARVESTED RIGHT GREATER SAPHENOUS VEIN (N/A) ECHOCARDIOGRAM, TRANSESOPHAGEAL (N/A)  POD#5  1 afeb, s BP 83-130's, AV seq paced 2 O2 sats good on 2 liters [place] 3 voiding well, not all measured, weight below preop 4 no new labs yet( pending) or xrays 5 mobilizing a little better 6 will give benadryl  for sleep aid tonight as she requests 7 push rehab and pulm hygiene as able 8 she is concerned about not having help at home- will get [facility] consult to see what's available  Lemond FORBES Cera PA-C Pager [phone] 08/21/2023 7:42 AM  Echo yesterday.  Good RV function.  TR not significantly changed from pre-op .   CT PE ordered and done to rule out PE.  No obvious PE on my read Below baseline weight Potentially ready for floor, but need more PT  Albie Arizpe O Matheo Rathbone

## 2023-08-21 NOTE — Plan of Care (Signed)
   Problem: Clinical Measurements: Goal: Cardiovascular complication will be avoided Outcome: Progressing   Problem: Activity: Goal: Risk for activity intolerance will decrease Outcome: Progressing

## 2023-08-21 NOTE — TOC Progression Note (Addendum)
 Transition of Care [facility]) - Progression Note    Patient Details  Name: Lindsay Camacho MRN: [id number] Date of Birth: Feb 20, 1965  Transition of [facility]) CM/SW Contact  Justina Delcia Czar, RN Phone Number: [phone] 08/21/2023, 5:04 PM  Clinical Narrative:    Spoke to pt and states she lives in home with dtr. Gave permission to contact dtr, aunt, cousin, and sister. Pt reports she has RW at home. She is requesting tub bench for home. Attempted call to dtr to get information about bathroom before order DME. Pt's dtr number is the wrong number.  Contacted [facility] HH rep, Cheryl with new referral. Will need HHRN, HH PT and aide orders with F2F.  Pt states dtr will take her to appts.   Pt wants Arlean to pick her up, contacted cousin and left message.   Ordered tub bench with [employer] rep, Mitch.    Expected Discharge Plan: Home w Home Health Services Barriers to Discharge: Continued Medical Work up               Expected Discharge Plan and Services In-house Referral: NA Discharge Planning Services: CM Consult Post Acute Care Choice: Home Health Living arrangements for the past 2 months: Single Family Home                   DME Agency: NA       HH Arranged: PT HH Agency: [facility] Date HH Agency Contacted: 08/21/23 Time HH Agency Contacted: 1657 Representative spoke with at [facility] Agency: Channing Ee   Social Drivers of Health (SDOH) Interventions SDOH Screenings   Food Insecurity: No Food Insecurity (08/10/2023)  Housing: Unknown (08/14/2023)  Transportation Needs: No Transportation Needs (08/10/2023)  Utilities: Not At Risk (08/10/2023)  Social Connections: Unknown (08/10/2023)  Tobacco Use: High Risk (08/16/2023)    Readmission Risk Interventions     No data to display

## 2023-08-21 NOTE — Progress Notes (Signed)
 NAME:  TENILLE MORRILL, MRN:  [id number], DOB:  07/10/1965, LOS: 11 ADMISSION DATE:  08/10/2023, CONSULTATION DATE:  08/21/23 REFERRING MD:  Kerrin, CHIEF COMPLAINT:  CABG   History of Present Illness:  Shuntell Foody is a 58 y.o. F with PMH significant for CAD with prior stenting, HL, HTN, ICD and previous MV repair, who presented with chest pain to OSH in [place] and went into Vfib while in the ED with ICD shock.   She underwent LHC which showed 3V disease and was transferred to [facility] for further evaluation.   On 8/14, she underwent CABG x3 (LIMA to LAD, saphenous vein graft to obtuse marginal and posterior descending).  Intraoperatively she was noted to have severe intrapericardial adhesions so femoral venous cannulation was performed.  The patient was transferred to the ICU post-op and extubated later in the day.    POD #1 she has refused some of her po medications and is complaining significant sternotomy discomfort. Remains on Milrinone  0.125 Norepi 4mcg.  She has been somewhat somnolent at time post-intubation   Pertinent  Medical History   has a past medical history of Acute myocardial infarction of other lateral wall, initial episode of care (01/2013), Aortoiliac occlusive disease (HCC) (08/22/2016), CAD (coronary artery disease) (11/13/2011), Chest pain, atypical, muscular skelatal   (01/09/2014), COVID (02/08/2020), Dyspnea, Heart failure (HCC), HLD (hyperlipidemia), Hypertension, Intermediate coronary syndrome (HCC), LBBB (left bundle branch block), Mitral regurgitation, Nonischemic cardiomyopathy (HCC) (01/26/2017), Presence of combination internal cardiac defibrillator (ICD) and pacemaker, PVD (peripheral vascular disease) (HCC), 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   Significant Hospital Events: Including procedures, antibiotic start and stop dates in addition to other pertinent events   8/14 3v  CABG and extubation 8/15 remains on milrinone  and low dose Norepi  Interim History / Subjective:  Didn't sleep well, no CP. + DOE.  Objective    Blood pressure (!) 83/63, pulse 86, temperature 98.2 F (36.8 C), temperature source Oral, resp. rate (!) 21, height 5' 1 (1.549 m), weight 49.5 kg, SpO2 100%.        Intake/Output Summary (Last 24 hours) at 08/21/2023 0738 Last data filed at 08/21/2023 0600 Gross per 24 hour  Intake 1347.13 ml  Output 650 ml  Net 697.13 ml   Filed Weights   08/19/23 0500 08/20/23 0500 08/21/23 0545  Weight: 48.9 kg 50.7 kg 49.5 kg    General:  chronically ill appearing woman lying in bed in NAD HEENT: N [place]/AT, eyes anicteric Neuro: sleeping but arouses to stimulation, answering questions  CV: S1S2, RRR- paced rhythm PULM:  breathing comfortably on [place], reduced basilar breath sounds GI: soft, NT Extremities: no peripheral edema, no cyanosis  AM labs ordered Echo: LVEF 60-65%, indeterminate diastolic function. Normal RV function, mildly enlarged RV with septal bowing into LV. H/o MV repair. Severe TR. Small pericardial effusion.    Resolved problem list   Assessment and Plan   3V CAD s/p CABG x3- LIMA to LAD, saphenous vein graft to obtuse marginal and posterior descending with significant intrapericardial adhesions. Pre-op  LVEF 55-60%,  Post-op cardiogenic shock -aspirin , rosuvastatin  -holding metoprolol  due to heart failure -progress diet as able -focus on increasing ambulation -pain control per protocol -pulmonary hygiene -con't digoxin , spironolactone   Acute RV failure, severe TR on echo -CTA chest to r/o PE  Nausea post op -PRN zofran   Acute respiratory failure with hypoxia, R pleural effusion. Had slower vent wean. Suspect chronic COPD with  history of wheezing and tobacco use PTA -hold on diuretics today until after CTA -pulmonary hygiene -wean supplemental O2 to maintain SpO2 >90% -yupelri  & brovana , albuterol   PRN  HTN -hold Bblocker and ARB   HLD  -rosuvastatin   Expected post-op ABLA, thrombocytopenia  -con't enteral iron supplements -transfuse for Hb <7 or hemodynamically significant bleeding -con't lovenox   Hyperglycemia; controlled -SSI PRN -goal BG 140-180  Tobacco abuse -recommend cessation -consider referral for lung cancer screening as OP    Best Practice (right click and Reselect all SmartList Selections daily)   Diet/type: Regular consistency (see orders) DVT prophylaxis LMWH Pressure ulcer(s): N/A GI prophylaxis: N/A Lines: N/A Foley:  N/A Code Status:  full code Last date of multidisciplinary goals of care discussion [per primary]  Labs   CBC: Recent Labs  Lab 08/17/23 0441 08/17/23 0442 08/17/23 1601 08/17/23 1630 08/18/23 0414 08/19/23 0419 08/20/23 0319  WBC 10.7*  --  14.6*  --  13.9* 7.0 10.8*  HGB 8.1*   < > 7.8* 7.8* 7.8* 7.5* 8.8*  HCT 23.6*   < > 23.1* 23.0* 23.4* 23.1* 27.1*  MCV 88.7  --  89.9  --  91.1 93.1 93.4  PLT 76*  --  92*  --  100* 76* 137*   < > = values in this interval not displayed.    Basic Metabolic Panel: Recent Labs  Lab 08/16/23 1926 08/16/23 2040 08/17/23 0441 08/17/23 0442 08/17/23 1539 08/17/23 1601 08/17/23 1630 08/18/23 0414 08/19/23 0419 08/19/23 0921 08/20/23 0319  NA 138   < > 141   < >  --  135 138 133* 136  --  135  K 4.2   < > 4.3   < >  --  3.9 4.0 3.8 4.0  --  3.7  CL 104  --  106  --   --  101  --  99 98  --  98  CO2 24  --  25  --   --  26  --  26 29  --  27  GLUCOSE 130*  --  108*  --   --  121*  --  121* 81  --  107*  BUN 11  --  10  --   --  10  --  9 10  --  16  CREATININE 0.96  --  0.90  --   --  0.91  --  1.03* 0.86  --  0.98  CALCIUM  8.7*  --  8.3*  --   --  8.2*  --  8.2* 8.3*  --  8.4*  MG 3.4*  --  2.1  --  1.6*  --   --  1.4*  --  1.8  --   PHOS  --   --   --   --   --   --   --  3.0  --   --   --    < > = values in this interval not displayed.       Critical care time:       Leita SHAUNNA Gaskins, DO 08/21/23 8:36 AM [facility]  For contact information, see Amion. If no response to pager, please call PCCM consult pager. After hours, 7PM- 7AM, please call Elink.

## 2023-08-21 NOTE — Progress Notes (Signed)
 Patient ID: Lindsay Camacho, female   DOB: [birth year], 58 y.o.   MRN: [id number]  TCTS Evening Rounds:  Hemodynamically stable in paced rhythm.   Ambulated around the ICU twice today.  Had echo today which showed normal LV, D-shaped septum suggesting RV overload, normal RV systolic function with mild enlargement. Severe TR was noted and prior echos has shown moderate TR.   CTA chest done which ruled out PE. Evidence of right heart failure with dilation of RV/RA and reflux of contrast into IVC and hepatic veins. Small bilateral pleural effusions and bibasilar atelectasis. Sternum is intact.  Continue mobilization.

## 2023-08-22 ENCOUNTER — Other Ambulatory Visit (HOSPITAL_COMMUNITY): Payer: Self-pay

## 2023-08-22 ENCOUNTER — Telehealth (HOSPITAL_COMMUNITY): Payer: Self-pay | Admitting: Pharmacy Technician

## 2023-08-22 DIAGNOSIS — R739 Hyperglycemia, unspecified: Secondary | ICD-10-CM | POA: Diagnosis not present

## 2023-08-22 DIAGNOSIS — Z951 Presence of aortocoronary bypass graft: Secondary | ICD-10-CM | POA: Diagnosis not present

## 2023-08-22 DIAGNOSIS — R579 Shock, unspecified: Secondary | ICD-10-CM | POA: Diagnosis not present

## 2023-08-22 DIAGNOSIS — J9601 Acute respiratory failure with hypoxia: Secondary | ICD-10-CM | POA: Diagnosis not present

## 2023-08-22 DIAGNOSIS — J90 Pleural effusion, not elsewhere classified: Secondary | ICD-10-CM

## 2023-08-22 DIAGNOSIS — I214 Non-ST elevation (NSTEMI) myocardial infarction: Secondary | ICD-10-CM | POA: Diagnosis not present

## 2023-08-22 LAB — BASIC METABOLIC PANEL WITH GFR
Anion gap: 7 (ref 5–15)
BUN: 10 mg/dL (ref 6–20)
CO2: 31 mmol/L (ref 22–32)
Calcium: 8.9 mg/dL (ref 8.9–10.3)
Chloride: 97 mmol/L — ABNORMAL LOW (ref 98–111)
Creatinine, Ser: 0.97 mg/dL (ref 0.44–1.00)
GFR, Estimated: >60 mL/min (ref >60)
Glucose, Bld: 98 mg/dL (ref 70–99)
Potassium: 5 mmol/L (ref 3.5–5.1)
Sodium: 135 mmol/L (ref 135–145)

## 2023-08-22 LAB — CBC
HCT: 26.4 % — ABNORMAL LOW (ref 36.0–46.0)
Hemoglobin: 8.5 g/dL — ABNORMAL LOW (ref 12.0–15.0)
MCH: 30.5 pg (ref 26.0–34.0)
MCHC: 32.2 g/dL (ref 30.0–36.0)
MCV: 94.6 fL (ref 80.0–100.0)
Platelets: 247 K/uL (ref 150–400)
RBC: 2.79 MIL/uL — ABNORMAL LOW (ref 3.87–5.11)
RDW: 16 % — ABNORMAL HIGH (ref 11.5–15.5)
WBC: 9.9 K/uL (ref 4.0–10.5)
nRBC: 0.4 % — ABNORMAL HIGH (ref 0.0–0.2)

## 2023-08-22 MED ORDER — SODIUM CHLORIDE 0.9% FLUSH
3.0000 mL | Freq: Two times a day (BID) | INTRAVENOUS | Status: DC
  Administered 2023-08-22: 3 mL via INTRAVENOUS

## 2023-08-22 MED ORDER — SODIUM CHLORIDE 0.9% FLUSH: 3.0000 mL | INTRAVENOUS | Status: DC | PRN

## 2023-08-22 MED ORDER — ASPIRIN 81 MG PO TBEC
81.0000 mg | DELAYED_RELEASE_TABLET | Freq: Every day | ORAL | Status: DC
  Administered 2023-08-23: 81 mg via ORAL
  Filled 2023-08-22 (×2): qty 1

## 2023-08-22 MED ORDER — ~~LOC~~ CARDIAC SURGERY, PATIENT & FAMILY EDUCATION: Freq: Once | Status: AC

## 2023-08-22 MED ORDER — ALBUTEROL SULFATE (2.5 MG/3ML) 0.083% IN NEBU: 3.0000 mL | INHALATION_SOLUTION | RESPIRATORY_TRACT | Status: DC | PRN

## 2023-08-22 MED ORDER — TRAMADOL HCL 50 MG PO TABS: 50.0000 mg | ORAL_TABLET | Freq: Four times a day (QID) | ORAL | Status: DC | PRN

## 2023-08-22 MED ORDER — EMPAGLIFLOZIN 10 MG PO TABS
10.0000 mg | ORAL_TABLET | Freq: Every day | ORAL | Status: DC
  Administered 2023-08-22 – 2023-08-23 (×2): 10 mg via ORAL
  Filled 2023-08-22 (×2): qty 1

## 2023-08-22 MED ORDER — SPIRONOLACTONE 12.5 MG HALF TABLET
12.5000 mg | ORAL_TABLET | Freq: Every day | ORAL | Status: DC
  Administered 2023-08-22 – 2023-08-23 (×2): 12.5 mg via ORAL
  Filled 2023-08-22 (×2): qty 1

## 2023-08-22 MED ORDER — UMECLIDINIUM BROMIDE 62.5 MCG/ACT IN AEPB: 1.0000 | INHALATION_SPRAY | Freq: Every day | RESPIRATORY_TRACT | Status: DC

## 2023-08-22 MED ORDER — BUDESON-GLYCOPYRROL-FORMOTEROL 160-9-4.8 MCG/ACT IN AERO
2.0000 | INHALATION_SPRAY | Freq: Two times a day (BID) | RESPIRATORY_TRACT | Status: DC
  Administered 2023-08-22 (×2): 2 via RESPIRATORY_TRACT
  Filled 2023-08-22: qty 5.9

## 2023-08-22 MED ORDER — SODIUM CHLORIDE 0.9 % IV SOLN: 250.0000 mL | INTRAVENOUS | Status: DC | PRN

## 2023-08-22 MED ORDER — CLOPIDOGREL BISULFATE 75 MG PO TABS
75.0000 mg | ORAL_TABLET | Freq: Every day | ORAL | Status: DC
  Administered 2023-08-22 – 2023-08-23 (×2): 75 mg via ORAL
  Filled 2023-08-22 (×2): qty 1

## 2023-08-22 NOTE — Progress Notes (Signed)
 Physical Therapy Treatment Patient Details Name: Lindsay Camacho MRN: [id number] DOB: September 30, 1965 Today's Date: 08/22/2023   History of Present Illness 58 yo female s/p 8/14 CABG x3 harvested R greater saphenous vein PMH MI, heart failure, HLD, HTN, LBBB s/p PPM, PVD, TIA.    PT Comments  Pt admitted with above diagnosis. Pt was able to ambulate and incr distance with supervision with RW and no LOB with challenges.  Pt agrees she needs RW for support at current level and has one at home.  Pt should progress well and just be able to f/u with Cardiac Rehab when MD approves.  Needs occasional cues for sternal precautions but overall progressing well.  Pt currently with functional limitations due to the deficits listed below (see PT Problem List). Pt will benefit from acute skilled PT to increase their independence and safety with mobility to allow discharge.       If plan is discharge home, recommend the following: Assistance with cooking/housework;Assist for transportation;Help with stairs or ramp for entrance   Can travel by private vehicle        Equipment Recommendations  None recommended by PT    Recommendations for Other Services       Precautions / Restrictions Precautions Precautions: Sternal Recall of Precautions/Restrictions: Intact Precaution/Restrictions Comments: handout provided and reviewed for sternal precautions with adls. Restrictions Weight Bearing Restrictions Per Provider Order: Yes (sternal precautions) RUE Weight Bearing Per Provider Order: Non weight bearing LUE Weight Bearing Per Provider Order: Non weight bearing     Mobility  Bed Mobility Overal bed mobility: Needs Assistance Bed Mobility: Rolling, Sidelying to Sit Rolling: Supervision Sidelying to sit: Contact guard assist       General bed mobility comments: cues for sternal precautions with sitting up with pt able to follow precautions.    Transfers Overall transfer level: Needs assistance    Transfers: Sit to/from Stand Sit to Stand: Contact guard assist           General transfer comment: No Cues for hand placement to stand from bed but did cue when sitting to recliner at end of session for hand placement for sternal precautions.    Ambulation/Gait Ambulation/Gait assistance: Supervision Gait Distance (Feet): 740 Feet Assistive device: Rolling walker (2 wheels) Gait Pattern/deviations: WFL(Within Functional Limits)   Gait velocity interpretation: 1.31 - 2.62 ft/sec, indicative of limited community ambulator   General Gait Details: Pt was able to ambulate on unit with RW with No LOB.   Stairs             Wheelchair Mobility     Tilt Bed    Modified Rankin (Stroke Patients Only)       Balance Overall balance assessment: Modified Independent                               Standardized Balance Assessment Standardized Balance Assessment : Dynamic Gait Index   Dynamic Gait Index Level Surface: Mild Impairment Change in Gait Speed: Mild Impairment Gait with Horizontal Head Turns: Mild Impairment Gait with Vertical Head Turns: Mild Impairment Gait and Pivot Turn: Mild Impairment Step Over Obstacle: Mild Impairment Step Around Obstacles: Mild Impairment Steps: Moderate Impairment Total Score: 15      Communication Communication Communication: No apparent difficulties  Cognition Arousal: Alert Behavior During Therapy: Flat affect  Following commands: Intact      Cueing Cueing Techniques: Verbal cues  Exercises General Exercises - Lower Extremity Ankle Circles/Pumps: AROM, Both, 10 reps, Supine Long Arc Quad: AROM, Both, 10 reps, Seated Hip Flexion/Marching: AROM, Both, 10 reps, Seated    General Comments General comments (skin integrity, edema, etc.): VSS on RA, other VSS      Pertinent Vitals/Pain Pain Assessment Pain Assessment: Faces Faces Pain Scale: Hurts little more Pain  Location: chest after walking incision pain Pain Descriptors / Indicators: Discomfort Pain Intervention(s): Limited activity within patient's tolerance, Monitored during session, Repositioned    Home Living                          Prior Function            PT Goals (current goals can now be found in the care plan section) Acute Rehab PT Goals Patient Stated Goal: to go home Progress towards PT goals: Progressing toward goals    Frequency    Min 2X/week      PT Plan      Co-evaluation              AM-PAC PT 6 Clicks Mobility   Outcome Measure  Help needed turning from your back to your side while in a flat bed without using bedrails?: A Little Help needed moving from lying on your back to sitting on the side of a flat bed without using bedrails?: A Little Help needed moving to and from a bed to a chair (including a wheelchair)?: A Little Help needed standing up from a chair using your arms (e.g., wheelchair or bedside chair)?: A Little Help needed to walk in hospital room?: A Little Help needed climbing 3-5 steps with a railing? : A Lot 6 Click Score: 17    End of Session Equipment Utilized During Treatment: Gait belt;Oxygen Activity Tolerance: Patient tolerated treatment well Patient left: in chair;with call bell/phone within reach;with chair alarm set Nurse Communication: Mobility status PT Visit Diagnosis: Muscle weakness (generalized) (M62.81)     Time: 8943-8875 PT Time Calculation (min) (ACUTE ONLY): 28 min  Charges:    $Gait Training: 23-37 mins PT General Charges $$ ACUTE PT VISIT: 1 Visit                     Cristal Qadir M,PT Acute Rehab Services [phone]    [name] 08/22/2023, 11:43 AM

## 2023-08-22 NOTE — Progress Notes (Signed)
 Occupational Therapy Treatment Patient Details Name: Lindsay Camacho MRN: [id number] DOB: Jan 20, 1965 Today's Date: 08/22/2023   History of present illness 58 yo female s/p 8/14 CABG x3 harvested R greater saphenous vein PMH MI, heart failure, HLD, HTN, LBBB s/p PPM, PVD, TIA.   OT comments  All education is complete and patient indicates understanding.pt at adequate level for dc from OT standpoint. Good return demo of precautions.       If plan is discharge home, recommend the following:      Equipment Recommendations  Tub/shower seat    Recommendations for Other Services Rehab consult    Precautions / Restrictions Precautions Precautions: Sternal Precaution Booklet Issued: Yes (comment) Recall of Precautions/Restrictions: Intact Precaution/Restrictions Comments: reviewed handout Restrictions Weight Bearing Restrictions Per Provider Order: Yes RUE Weight Bearing Per Provider Order: Non weight bearing LUE Weight Bearing Per Provider Order: Non weight bearing       Mobility Bed Mobility Overal bed mobility: Modified Independent                  Transfers Overall transfer level: Modified independent                       Balance                                           ADL either performed or assessed with clinical judgement   ADL Overall ADL's : Modified independent                         Toilet Transfer: Modified Independent             General ADL Comments: reviewed energy conservaiton and the sternal precuations.    Extremity/Trunk Assessment Upper Extremity Assessment Upper Extremity Assessment: Overall WFL for tasks assessed   Lower Extremity Assessment Lower Extremity Assessment: Overall WFL for tasks assessed        Vision   Vision Assessment?: No apparent visual deficits   Perception     Praxis     Communication     Cognition Arousal: Alert Behavior During Therapy: Flat affect Cognition:  No apparent impairments                                        Cueing      Exercises      Shoulder Instructions       General Comments RA VSS    Pertinent Vitals/ Pain       Pain Assessment Pain Assessment: No/denies pain  Home Living                                          Prior Functioning/Environment              Frequency  Min 2X/week        Progress Toward Goals  OT Goals(current goals can now be found in the care plan section)     Acute Rehab OT Goals Patient Stated Goal: to go home OT Goal Formulation: With patient Time For Goal Achievement: 09/03/23 Potential to Achieve Goals: Good ADL Goals Pt Will Perform Upper Body Bathing: with  modified independence;sitting Pt Will Perform Lower Body Bathing: with modified independence;sit to/from stand Additional ADL Goal #1: pt will demonstrate two energy conservation stragetiges for adls mod I  Plan      Co-evaluation                 AM-PAC OT 6 Clicks Daily Activity     Outcome Measure   Help from another person eating meals?: None Help from another person taking care of personal grooming?: None Help from another person toileting, which includes using toliet, bedpan, or urinal?: None Help from another person bathing (including washing, rinsing, drying)?: None Help from another person to put on and taking off regular upper body clothing?: None Help from another person to put on and taking off regular lower body clothing?: None 6 Click Score: 24    End of Session    OT Visit Diagnosis: Unsteadiness on feet (R26.81);Muscle weakness (generalized) (M62.81)   Activity Tolerance Patient tolerated treatment well   Patient Left in bed;with call bell/phone within reach;with bed alarm set   Nurse Communication Mobility status;Precautions        Time: 1500-1510 OT Time Calculation (min): 10 min  Charges: OT General Charges $OT Visit: 1 Visit OT  Treatments $Self Care/Home Management : 8-22 mins   Brynn, OTR/L  Acute Rehabilitation Services Office: [phone] .   Ely Molt 08/22/2023, 3:42 PM

## 2023-08-22 NOTE — Progress Notes (Addendum)
 Advanced Heart Failure Rounding Note   Subjective:    POD #6 CABG x 3  Limited echo 8/28 EF 60-65%, D-shaped septum, RV mildly enlarged w/ normal SFx, severe TR, mitral valve ok.   CT of chest negative for PE. +  Increased cardiomegaly with dilatation of the right atrium and right ventricle and reflux of contrast into the IVC and hepatic veins, consistent with right heart failure.  Back below pre-op  wt   Scr 0.97  K 5.0   SBPs 130s.   Feels much better today. Off supp O2. No dyspnea. Has been ambulating more and doing well    Objective:   Weight Range:  Vital Signs:   Temp:  [97.4 F (36.3 C)-98.4 F (36.9 C)] 97.8 F (36.6 C) (08/20 0746) Pulse Rate:  [82-87] 84 (08/20 0700) Resp:  [9-26] 25 (08/20 0700) BP: (97-145)/(44-88) 144/71 (08/20 0700) SpO2:  [91 %-100 %] 95 % (08/20 0700) Weight:  [49.2 kg] 49.2 kg (08/20 0500) Last BM Date : 08/20/23  Weight change: Filed Weights   08/20/23 0500 08/21/23 0545 08/22/23 0500  Weight: 50.7 kg 49.5 kg 49.2 kg    Intake/Output:   Intake/Output Summary (Last 24 hours) at 08/22/2023 0829 Last data filed at 08/22/2023 9376 Gross per 24 hour  Intake 360 ml  Output 1750 ml  Net -1390 ml    Physical Exam  GENERAL: NAD Lungs- clear  CARDIAC:  TR CV waves ~6-7 cm          Normal rate with regular rhythm. 2/6 TR murmur.  No edema ABDOMEN: Soft, non-tender, non-distended.  EXTREMITIES: Warm and well perfused.  NEUROLOGIC: No obvious FND  Telemetry: a-v paced 80s, Personally reviewed  Labs: Basic Metabolic Panel: Recent Labs  Lab 08/16/23 1926 08/16/23 2040 08/17/23 0441 08/17/23 0442 08/17/23 1539 08/17/23 1601 08/18/23 0414 08/19/23 0419 08/19/23 0921 08/20/23 0319 08/21/23 0852 08/22/23 0221  NA 138   < > 141   < >  --    < > 133* 136  --  135 132* 135  K 4.2   < > 4.3   < >  --    < > 3.8 4.0  --  3.7 3.9 5.0  CL 104  --  106  --   --    < > 99 98  --  98 94* 97*  CO2 24  --  25  --   --    < > 26  29  --  27 31 31   GLUCOSE 130*  --  108*  --   --    < > 121* 81  --  107* 114* 98  BUN 11  --  10  --   --    < > 9 10  --  16 11 10   CREATININE 0.96  --  0.90  --   --    < > 1.03* 0.86  --  0.98 1.04* 0.97  CALCIUM  8.7*  --  8.3*  --   --    < > 8.2* 8.3*  --  8.4* 8.4* 8.9  MG 3.4*  --  2.1  --  1.6*  --  1.4*  --  1.8  --   --   --   PHOS  --   --   --   --   --   --  3.0  --   --   --   --   --    < > = values  in this interval not displayed.    Liver Function Tests: No results for input(s): AST, ALT, ALKPHOS, BILITOT, PROT, ALBUMIN  in the last 168 hours. No results for input(s): LIPASE, AMYLASE in the last 168 hours. No results for input(s): AMMONIA in the last 168 hours.  CBC: Recent Labs  Lab 08/18/23 0414 08/19/23 0419 08/20/23 0319 08/21/23 0852 08/22/23 0221  WBC 13.9* 7.0 10.8* 10.7* 9.9  HGB 7.8* 7.5* 8.8* 8.1* 8.5*  HCT 23.4* 23.1* 27.1* 25.6* 26.4*  MCV 91.1 93.1 93.4 94.5 94.6  PLT 100* 76* 137* 199 247    Medications:     Scheduled Medications:  acetaminophen   650 mg Oral QID   amiodarone   200 mg Oral BID   arformoterol   15 mcg Nebulization BID   aspirin  EC  325 mg Oral Daily   Or   aspirin   324 mg Per Tube Daily   bisacodyl   10 mg Oral Daily   Or   bisacodyl   10 mg Rectal Daily   Chlorhexidine  Gluconate Cloth  6 each Topical Daily   docusate sodium   200 mg Oral Daily   enoxaparin  (LOVENOX ) injection  40 mg Subcutaneous Daily   ezetimibe   10 mg Oral Daily   Fe Fum-Vit C-Vit B12-FA  1 capsule Oral QPC breakfast   lidocaine   1 patch Transdermal Q24H   pantoprazole   40 mg Oral Daily   polyethylene glycol  17 g Oral BID   revefenacin   175 mcg Nebulization Daily   rosuvastatin   40 mg Oral Daily   sodium chloride  flush  3 mL Intravenous Q12H   spironolactone   25 mg Oral Daily    Infusions:    PRN Medications: albuterol , diphenhydrAMINE , ondansetron  (ZOFRAN ) IV, mouth rinse, oxyCODONE , sodium chloride  flush,  traMADol    Assessment/Plan:   Post Cardiotomy Shock  - HFpEF w/ predominant RV dysfunction  - pre-op  echo 8/10 EF 55-60%, TEE post op showing moderately reduced EF, mildly reduced RV function - shock resolved  - Off milrinone  since 8/16, stable - Repeat limited echo 8/18 EF 60-65%, D-shaped septum, RV mildly enlarged w/ normal SFx, severe TR, mitral valve ok  -volume status ok, back below pre-op  wt - start SGLT2i, Jardiance  10 mg daily  - reduce spironolactone  to 12.5 mg daily   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 Will need Plavix  prior to discharge   Atrial fibrillation: Noted after removal of introducer. - A-V paced on tele  - Continue PO amio 200 mg bid  - Hold OAC   VF - shocked by ICD while admitted at [facility], suspect ischemically mediated from obstructive CAD - monitor on tele  - keep K > 4.0 and Mg > 2.0  - rhythm stable - continue PO amio    H/o Mitral Valve Disease - s/p minimally invasive MV repair, via rt thoracotomy in 2018 - stable valve ring on pre-CABG TEE, mild MR and mild AS  - stable on Limited echo 8/18   Tricuspid Regurgitation  - severe on limited echo 8/18, functional in setting of dilated RV   - RV-RA peak gradient 21 mmHg  - optimization w/ diuretics per above  - can follow as outpatient   Pt has transfer orders out of CCU today. Will reassign daily rounds to general cardiology, starting 8/21.   Heart failure team will sign off as of 08/22/23  Follow up as an outpatient in the HF clinic ?  NO  If  no please follow up with [facility] after discharge.     Length of Stay: [address] Marcine RIGGERS  08/22/2023, 8:29 AM  Advanced Heart Failure Team Pager [phone] (M-F; 7a - 4p)   Patient seen and examined with the above-signed Advanced Practice Provider and/or Housestaff. I personally reviewed laboratory data, imaging studies and relevant notes. I independently examined the  patient and formulated the important aspects of the plan. I have edited the note to reflect any of my changes or salient points. I have personally discussed the plan with the patient and/or family.  Doing well today. Denies SP or SOB. Ambulating unit. Off O2. Weight below pre-op   General:  Sitting up in bed . No resp difficulty HEENT: normal Neck: supple. no JVD. Carotids 2+ bilat; no bruits. No lymphadenopathy or thryomegaly appreciated. Cor: PMI nondisplaced. Regular rate & rhythm. No rubs, gallops or murmurs. Lungs: clear Abdomen: soft, nontender, nondistended. No hepatosplenomegaly. No bruits or masses. Good bowel sounds. Extremities: no cyanosis, clubbing, rash, edema Neuro: alert & orientedx3, cranial nerves grossly intact. moves all 4 extremities w/o difficulty. Affect pleasant  Doing well post-op. Now euvolemic. Stop lasix . Add SGLT2i. Decrease spiro to 12.5. Can go to floor.   Toribio Fuel, MD  4:25 PM

## 2023-08-22 NOTE — Progress Notes (Signed)
 NAME:  Lindsay Camacho, MRN:  [id number], DOB:  1965/07/12, LOS: 12 ADMISSION DATE:  08/10/2023, CONSULTATION DATE:  08/22/23 REFERRING MD:  Kerrin, CHIEF COMPLAINT:  CABG   History of Present Illness:  Lindsay Camacho is a 58 y.o. F with PMH significant for CAD with prior stenting, HL, HTN, ICD and previous MV repair, who presented with chest pain to OSH in [place] and went into Vfib while in the ED with ICD shock.   She underwent LHC which showed 3V disease and was transferred to [facility] for further evaluation.   On 8/14, she underwent CABG x3 (LIMA to LAD, saphenous vein graft to obtuse marginal and posterior descending).  Intraoperatively she was noted to have severe intrapericardial adhesions so femoral venous cannulation was performed.  The patient was transferred to the ICU post-op and extubated later in the day.    POD #1 she has refused some of her po medications and is complaining significant sternotomy discomfort. Remains on Milrinone  0.125 Norepi .  She has been somewhat somnolent at time post-intubation   Pertinent  Medical History   has a past medical history of Acute myocardial infarction of other lateral wall, initial episode of care (01/2013), Aortoiliac occlusive disease (HCC) (08/22/2016), CAD (coronary artery disease) (11/13/2011), Chest pain, atypical, muscular skelatal   (01/09/2014), COVID (02/08/2020), Dyspnea, Heart failure (HCC), HLD (hyperlipidemia), Hypertension, Intermediate coronary syndrome (HCC), LBBB (left bundle branch block), Mitral regurgitation, Nonischemic cardiomyopathy (HCC) (01/26/2017), Presence of combination internal cardiac defibrillator (ICD) and pacemaker, PVD (peripheral vascular disease) (HCC), 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   Significant Hospital Events: Including procedures, antibiotic start and stop dates in addition to other pertinent events   8/14 3v  CABG and extubation 8/15 remains on milrinone  and low dose Norepi  Interim History / Subjective:  Didn't sleep well, no CP. + DOE.  Objective    Blood pressure (!) 144/71, pulse 84, temperature 97.8 F (36.6 C), temperature source Oral, resp. rate (!) 25, height 5' 1 (1.549 m), weight 49.2 kg, SpO2 95%.        Intake/Output Summary (Last 24 hours) at 08/22/2023 0855 Last data filed at 08/22/2023 9376 Gross per 24 hour  Intake 360 ml  Output 1750 ml  Net -1390 ml   Filed Weights   08/20/23 0500 08/21/23 0545 08/22/23 0500  Weight: 50.7 kg 49.5 kg 49.2 kg    General Sitting up in chair no acute distress HEENT normocephalic atraumatic no jugular venous distention is appreciated Pulmonary: Clear to auscultation no accessory use Card RRR  Abdomen soft nontender Extremities are warm dry without edema Neuro is awake and oriented no focal deficits are appreciated   Resolved problem list  Post-op cardiogenic shock Acute RV failure, severe TR on echo Nausea post op Acute respiratory failure with hypoxia, R pleural effusion. Had slower vent wean. Assessment and Plan   3V CAD s/p CABG x3 HLD - LIMA to LAD, saphenous vein graft to obtuse marginal and posterior descending with significant intrapericardial adhesions. Pre-op  LVEF 55-60%,  Plan Ready for floor Cont ambulate  Cardiac rehab Cont asa  Pain control  Plavix  to start prior to dc No BB given lower HR Cont statin and zetia    HFpEF w/ RV enlargement severe TR  HTN -EF improved. RV fxn improved Plan GDMT Per adv HF team   Suspect chronic COPD with history of wheezing and tobacco use PTA Plan Switch nebulized therapy to multidose inhalers,  and as needed Ventolin  Continue smoking cessation Would benefit from formal pulmonary follow-up.  Will place a call into our office  Atrial fibrillation Plan Holding systemic anticoagulation Continuing amiodarone  Continue aspirin   Expected post-op ABLA, thrombocytopenia   Stable/improved Plan Monitor      Best Practice (right click and Reselect all SmartList Selections daily)   Diet/type: Regular consistency (see orders) DVT prophylaxis LMWH Pressure ulcer(s): N/A GI prophylaxis: N/A Lines: N/A Foley:  N/A Code Status:  full code Last date of multidisciplinary goals of care discussion [per primary]  We will s/o       Critical care time:  NA

## 2023-08-22 NOTE — Progress Notes (Addendum)
 TCTS DAILY ICU PROGRESS NOTE                   [address]            [place] [address]          [phone]   6 Days Post-Op Procedure(s) (LRB): CORONARY ARTERY BYPASS GRAFTING (CABG) TIMES THREE USING LEFT INTERNAL MAMMARY ARTERY AND ENDOSCOPICALLY HARVESTED RIGHT GREATER SAPHENOUS VEIN (N/A) ECHOCARDIOGRAM, TRANSESOPHAGEAL (N/A)  Total Length of Stay:  LOS: 12 days   Subjective: Looks stronger, feeling better  Objective: Vital signs in last 24 hours: Temp:  [97.4 F (36.3 C)-98.4 F (36.9 C)] 97.5 F (36.4 C) (08/20 0311) Pulse Rate:  [82-87] 84 (08/20 0700) Cardiac Rhythm: A-V Sequential paced (08/20 0530) Resp:  [9-26] 25 (08/20 0700) BP: (94-145)/(44-88) 144/71 (08/20 0700) SpO2:  [91 %-100 %] 95 % (08/20 0700) Weight:  [49.2 kg] 49.2 kg (08/20 0500)  Filed Weights   08/20/23 0500 08/21/23 0545 08/22/23 0500  Weight: 50.7 kg 49.5 kg 49.2 kg    Weight change: -0.3 kg   Hemodynamic parameters for last 24 hours:    Intake/Output from previous day: 08/19 0701 - 08/20 0700 In: 480 [P.O.:480] Out: 1750 [Urine:1750]  Intake/Output this shift: No intake/output data recorded.  Current Meds: Scheduled Meds:  acetaminophen   650 mg Oral QID   amiodarone   200 mg Oral BID   arformoterol   15 mcg Nebulization BID   aspirin  EC  325 mg Oral Daily   Or   aspirin   324 mg Per Tube Daily   bisacodyl   10 mg Oral Daily   Or   bisacodyl   10 mg Rectal Daily   Chlorhexidine  Gluconate Cloth  6 each Topical Daily   docusate sodium   200 mg Oral Daily   enoxaparin  (LOVENOX ) injection  40 mg Subcutaneous Daily   ezetimibe   10 mg Oral Daily   Fe Fum-Vit C-Vit B12-FA  1 capsule Oral QPC breakfast   lidocaine   1 patch Transdermal Q24H   pantoprazole   40 mg Oral Daily   polyethylene glycol  17 g Oral BID   revefenacin   175 mcg Nebulization Daily   rosuvastatin   40 mg Oral Daily   sodium chloride  flush  3 mL Intravenous Q12H   spironolactone   25 mg Oral Daily    Continuous Infusions: PRN Meds:.albuterol , diphenhydrAMINE , ondansetron  (ZOFRAN ) IV, mouth rinse, oxyCODONE , sodium chloride  flush, traMADol   General appearance: alert, cooperative, and no distress Heart: regular rate and rhythm Lungs: dim in bases Abdomen: benign Extremities: no edema Wound: incis healing well  Lab Results: CBC: Recent Labs    08/21/23 0852 08/22/23 0221  WBC 10.7* 9.9  HGB 8.1* 8.5*  HCT 25.6* 26.4*  PLT 199 247   BMET:  Recent Labs    08/21/23 0852 08/22/23 0221  NA 132* 135  K 3.9 5.0  CL 94* 97*  CO2 31 31  GLUCOSE 114* 98  BUN 11 10  CREATININE 1.04* 0.97  CALCIUM  8.4* 8.9    CMET: Lab Results  Component Value Date   WBC 9.9 08/22/2023   HGB 8.5 (L) 08/22/2023   HCT 26.4 (L) 08/22/2023   PLT 247 08/22/2023   GLUCOSE 98 08/22/2023   CHOL 221 (H) 08/11/2023   TRIG 51 08/11/2023   HDL 49 08/11/2023   LDLCALC 162 (H) 08/11/2023   ALT 13 08/10/2023   AST 25 08/10/2023   NA 135 08/22/2023   K 5.0 08/22/2023   CL 97 (L)  08/22/2023   CREATININE 0.97 08/22/2023   BUN 10 08/22/2023   CO2 31 08/22/2023   TSH 1.700 01/21/2017   INR 1.9 (H) 08/16/2023   HGBA1C 5.1 08/16/2023      PT/INR: No results for input(s): LABPROT, INR in the last 72 hours. 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 EXAM: CT ANGIOGRAPHY CHEST WITH CONTRAST TECHNIQUE: Multidetector CT imaging of the chest was performed using the standard protocol during bolus administration of intravenous contrast. Multiplanar CT image reconstructions and MIPs were obtained to evaluate the vascular anatomy. RADIATION DOSE REDUCTION: This exam was performed according to the departmental dose-optimization program which includes automated exposure  control, adjustment of the mA and/or kV according to patient size and/or use of iterative reconstruction technique. CONTRAST:  75mL OMNIPAQUE  IOHEXOL  350 MG/ML SOLN COMPARISON:  Chest CTA 03/20/2023 and 12/28/2022. FINDINGS: Cardiovascular: The pulmonary arteries are well opacified with contrast to the level of the segmental branches. There is no evidence of acute pulmonary embolism. No significant central enlargement of the pulmonary arteries. Atherosclerosis of the aorta, great vessels and coronary arteries status post median sternotomy, CABG, mitral valve replacement and left subclavian stenting. Left subclavian pacemaker/defibrillator leads extend into the right atrium and right ventricle. Increased cardiomegaly with dilatation of the right atrium and right ventricle. Small pericardial effusion noted. Mediastinum/Nodes: There are no enlarged mediastinal, hilar or axillary lymph nodes. The thyroid gland, trachea and esophagus demonstrate no significant findings. Lungs/Pleura: New small to moderate dependent bilateral pleural effusions with associated dependent atelectasis at both lung bases. No confluent airspace disease or suspicious nodularity. Upper abdomen: Increased dilatation and reflux of contrast into the IVC and hepatic veins. Irregular abdominal and branch vessel atherosclerosis without evidence of large vessel occlusion. No acute findings are seen in the visualized upper abdomen. Musculoskeletal/Chest wall: There is no chest wall mass or suspicious osseous finding. Healed median sternotomy. Review of the MIP images confirms the above findings. IMPRESSION: 1. No evidence of acute pulmonary embolism or other acute vascular findings in the chest. 2. Increased cardiomegaly with dilatation of the right atrium and right ventricle and reflux of contrast into the IVC and hepatic veins, consistent with right heart failure. 3. New small to moderate dependent bilateral pleural effusions with associated dependent  atelectasis at both lung bases. 4.  Aortic Atherosclerosis (ICD10-I70.0). Electronically Signed   By: Elsie Perone M.D.   On: 08/21/2023 10:27     Assessment/Plan: S/P Procedure(s) (LRB): CORONARY ARTERY BYPASS GRAFTING (CABG) TIMES THREE USING LEFT INTERNAL MAMMARY ARTERY AND ENDOSCOPICALLY HARVESTED RIGHT GREATER SAPHENOUS VEIN (N/A) ECHOCARDIOGRAM, TRANSESOPHAGEAL (N/A) POD#6  1 afeb, s BP 95-140's, AV paced,had afib earlier after removing introducer,  on amio and spiro- will need plavix  prior to d/c  2 sats good on 2 liters 3 weight below preop- good UOP- diuretics-spiro 4 normal renal fxn 5 h/h stable- on Iron 6 no leukocytosis 7 no PE on scan, small- mod effus,Echo on 2/18-  RV fxn good on echo, Ef 60-65% , severe TR- PCCM and AHF assisting w/ management 8 thrombocytopenia resolved 9 needs more aggressive pulm hygiene and rehab as able    Lindsay Camacho Lindsay Camacho Pager [phone] 08/22/2023 7:46 AM   Chart reviewed, patient examined, agree with above.  She is doing well. She walked 5 times yesterday around the ICU without oxygen  and walked today. Her sats are 98% off oxygen so she is only using for comfort. Will DC it. Transfer to 4E and continue mobilization. Discharge planning.

## 2023-08-22 NOTE — Telephone Encounter (Signed)
 Patient Product/process development scientist completed.    The patient is insured through [employer]. Patient has Medicare and is not eligible for a copay card, but may be able to apply for patient assistance or Medicare RX Payment Plan (Patient Must reach out to their plan, if eligible for payment plan), if available.    Ran test claim for Farixga 10 mg and the current 30 day co-pay is $0.00.  Ran test claim for Jardiance  10 mg and the current 30 day co-pay is $0.00.  This test claim was processed through  [facility]- copay amounts may vary at other pharmacies due to pharmacy/plan contracts, or as the patient moves through the different stages of their insurance plan.     Reyes Sharps, CPHT Pharmacy Technician III Certified Patient Advocate [facility] Patient Advocate Team Direct Number: [phone]  Fax: [phone]

## 2023-08-22 NOTE — Discharge Instructions (Signed)

## 2023-08-23 LAB — BASIC METABOLIC PANEL WITH GFR
Anion gap: 12 (ref 5–15)
BUN: 11 mg/dL (ref 6–20)
CO2: 28 mmol/L (ref 22–32)
Calcium: 9 mg/dL (ref 8.9–10.3)
Chloride: 100 mmol/L (ref 98–111)
Creatinine, Ser: 1.23 mg/dL — ABNORMAL HIGH (ref 0.44–1.00)
GFR, Estimated: 51 mL/min — ABNORMAL LOW (ref >60)
Glucose, Bld: 102 mg/dL — ABNORMAL HIGH (ref 70–99)
Potassium: 4.6 mmol/L (ref 3.5–5.1)
Sodium: 140 mmol/L (ref 135–145)

## 2023-08-23 LAB — MAGNESIUM: Magnesium: 1.7 mg/dL (ref 1.7–2.4)

## 2023-08-23 MED ORDER — BUDESON-GLYCOPYRROL-FORMOTEROL 160-9-4.8 MCG/ACT IN AERO: 2.0000 | INHALATION_SPRAY | Freq: Two times a day (BID) | RESPIRATORY_TRACT | 1 refills | Status: AC

## 2023-08-23 MED ORDER — OXYCODONE HCL 5 MG PO TABS: 5.0000 mg | ORAL_TABLET | Freq: Four times a day (QID) | ORAL | 0 refills | Status: AC | PRN

## 2023-08-23 MED ORDER — ROSUVASTATIN CALCIUM 40 MG PO TABS: 40.0000 mg | ORAL_TABLET | Freq: Every day | ORAL | 1 refills | Status: DC

## 2023-08-23 MED ORDER — FE FUM-VIT C-VIT B12-FA 460-60-0.01-1 MG PO CAPS: 1.0000 | ORAL_CAPSULE | Freq: Every day | ORAL | 1 refills | Status: AC

## 2023-08-23 MED ORDER — EZETIMIBE 10 MG PO TABS: 10.0000 mg | ORAL_TABLET | Freq: Every day | ORAL | 1 refills | Status: DC

## 2023-08-23 MED ORDER — EMPAGLIFLOZIN 10 MG PO TABS: 10.0000 mg | ORAL_TABLET | Freq: Every day | ORAL | 1 refills | Status: DC

## 2023-08-23 MED ORDER — MAGNESIUM GLUCONATE 500 (27 MG) MG PO TABS
500.0000 mg | ORAL_TABLET | Freq: Every day | ORAL | Status: DC
  Administered 2023-08-23: 500 mg via ORAL
  Filled 2023-08-23: qty 1

## 2023-08-23 MED ORDER — ACETAMINOPHEN 325 MG PO TABS: 650.0000 mg | ORAL_TABLET | Freq: Four times a day (QID) | ORAL | Status: AC | PRN

## 2023-08-23 MED ORDER — MAGNESIUM GLUCONATE 500 (27 MG) MG PO TABS: 500.0000 mg | ORAL_TABLET | Freq: Every day | ORAL | 0 refills | Status: DC

## 2023-08-23 MED FILL — Potassium Chloride Inj 2 mEq/ML: INTRAVENOUS | Qty: 40 | Status: AC

## 2023-08-23 MED FILL — Heparin Sodium (Porcine) Inj 1000 Unit/ML: Qty: 1000 | Status: AC

## 2023-08-23 MED FILL — Magnesium Sulfate Inj 50%: INTRAMUSCULAR | Qty: 10 | Status: AC

## 2023-08-23 NOTE — Progress Notes (Signed)
 7 Days Post-Op Procedure(s) (LRB): CORONARY ARTERY BYPASS GRAFTING (CABG) TIMES THREE USING LEFT INTERNAL MAMMARY ARTERY AND ENDOSCOPICALLY HARVESTED RIGHT GREATER SAPHENOUS VEIN (N/A) ECHOCARDIOGRAM, TRANSESOPHAGEAL (N/A) Subjective: Looks and feels well, wants to go home  Objective: Vital signs in last 24 hours: Temp:  [97.7 F (36.5 C)-98.3 F (36.8 C)] 97.7 F (36.5 C) (08/21 0340) Pulse Rate:  [81-104] 84 (08/21 0340) Cardiac Rhythm: A-V Sequential paced;Bundle branch block (08/20 1955) Resp:  [10-26] 18 (08/21 0340) BP: (111-144)/(69-90) 124/79 (08/21 0340) SpO2:  [92 %-100 %] 99 % (08/21 0340) Weight:  [48.4 kg] 48.4 kg (08/21 0500)  Hemodynamic parameters for last 24 hours:    Intake/Output from previous day: No intake/output data recorded. Intake/Output this shift: No intake/output data recorded.  General appearance: alert, cooperative, and no distress Heart: regular rate and rhythm Lungs: min dim in bases Abdomen: benign Extremities: no edema Wound: incis healing well  Lab Results: Recent Labs    08/21/23 0852 08/22/23 0221  WBC 10.7* 9.9  HGB 8.1* 8.5*  HCT 25.6* 26.4*  PLT 199 247   BMET:  Recent Labs    08/22/23 0221 08/23/23 0334  NA 135 140  K 5.0 4.6  CL 97* 100  CO2 31 28  GLUCOSE 98 102*  BUN 10 11  CREATININE 0.97 1.23*  CALCIUM  8.9 9.0    PT/INR: No results for input(s): LABPROT, INR in the last 72 hours. ABG    Component Value Date/Time   PHART 7.316 (L) 08/17/2023 1630   HCO3 28.3 (H) 08/17/2023 1630   TCO2 30 08/17/2023 1630   ACIDBASEDEF 2.0 08/16/2023 2235   O2SAT 63.6 08/19/2023 0419   CBG (last 3)  No results for input(s): GLUCAP in the last 72 hours.  Meds Scheduled Meds:  acetaminophen   650 mg Oral QID   aspirin  EC  81 mg Oral Daily   budesonide -glycopyrrolate -formoterol   2 puff Inhalation BID   clopidogrel   75 mg Oral Daily   empagliflozin   10 mg Oral Daily   enoxaparin  (LOVENOX ) injection  40 mg  Subcutaneous Daily   ezetimibe   10 mg Oral Daily   Fe Fum-Vit C-Vit B12-FA  1 capsule Oral QPC breakfast   lidocaine   1 patch Transdermal Q24H   pantoprazole   40 mg Oral Daily   rosuvastatin   40 mg Oral Daily   sodium chloride  flush  3 mL Intravenous Q12H   spironolactone   12.5 mg Oral Daily   Continuous Infusions:  sodium chloride      PRN Meds:.sodium chloride , albuterol , diphenhydrAMINE , ondansetron  (ZOFRAN ) IV, mouth rinse, oxyCODONE , sodium chloride  flush, traMADol   Xrays CT Angio Chest Pulmonary Embolism (PE) W or WO Contrast Result Date: 08/21/2023 CLINICAL DATA:  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 EXAM: CT ANGIOGRAPHY CHEST WITH CONTRAST TECHNIQUE: Multidetector CT imaging of the chest was performed using the standard protocol during bolus administration of intravenous contrast. Multiplanar CT image reconstructions and MIPs were obtained to evaluate the vascular anatomy. RADIATION DOSE REDUCTION: This exam was performed according to the departmental dose-optimization program which includes automated exposure control, adjustment of the mA and/or kV according to patient size and/or use of iterative reconstruction technique. CONTRAST:  75mL OMNIPAQUE  IOHEXOL  350 MG/ML SOLN COMPARISON:  Chest CTA 03/20/2023 and 12/28/2022. FINDINGS: Cardiovascular: The pulmonary arteries are well opacified with contrast to the level of the segmental branches. There is no evidence of acute pulmonary embolism. No significant central enlargement of the  pulmonary arteries. Atherosclerosis of the aorta, great vessels and coronary arteries status post median sternotomy, CABG, mitral valve replacement and left subclavian stenting. Left subclavian pacemaker/defibrillator leads extend into the right atrium and right ventricle. Increased cardiomegaly with dilatation of the right atrium and  right ventricle. Small pericardial effusion noted. Mediastinum/Nodes: There are no enlarged mediastinal, hilar or axillary lymph nodes. The thyroid gland, trachea and esophagus demonstrate no significant findings. Lungs/Pleura: New small to moderate dependent bilateral pleural effusions with associated dependent atelectasis at both lung bases. No confluent airspace disease or suspicious nodularity. Upper abdomen: Increased dilatation and reflux of contrast into the IVC and hepatic veins. Irregular abdominal and branch vessel atherosclerosis without evidence of large vessel occlusion. No acute findings are seen in the visualized upper abdomen. Musculoskeletal/Chest wall: There is no chest wall mass or suspicious osseous finding. Healed median sternotomy. Review of the MIP images confirms the above findings. IMPRESSION: 1. No evidence of acute pulmonary embolism or other acute vascular findings in the chest. 2. Increased cardiomegaly with dilatation of the right atrium and right ventricle and reflux of contrast into the IVC and hepatic veins, consistent with right heart failure. 3. New small to moderate dependent bilateral pleural effusions with associated dependent atelectasis at both lung bases. 4.  Aortic Atherosclerosis (ICD10-I70.0). Electronically Signed   By: Elsie Perone M.D.   On: 08/21/2023 10:27    Assessment/Plan: S/P Procedure(s) (LRB): CORONARY ARTERY BYPASS GRAFTING (CABG) TIMES THREE USING LEFT INTERNAL MAMMARY ARTERY AND ENDOSCOPICALLY HARVESTED RIGHT GREATER SAPHENOUS VEIN (N/A) ECHOCARDIOGRAM, TRANSESOPHAGEAL (N/A) POD#6  1 afeb, VSS, AV paced(internal),now on plavix  for NSTEMI, AHF assisting w/ care 2 O2 sats good on RA 3 weight below preop- on spiro , loop diuretics stopped, voiding- not measured, has severe TR, dilated RV- on SGLT2i also  4 creat up a little today 1.23, monitor, Mg++ 1.7 will supplement  5 conts rehab modalities 6 HH arrangements being made 7 appears stable for  D/C    LOS: 13 days    Lemond FORBES Cera PA-C Pager [phone] 08/23/2023

## 2023-08-23 NOTE — TOC Transition Note (Addendum)
 Transition of Care [facility]) - Discharge Note   Patient Details  Name: Lindsay Camacho MRN: [id number] Date of Birth: September 08, 1965  Transition of Care [facility] CM/SW Contact:  Justina Delcia Czar, RN Phone Number: [phone] 08/23/2023, 2:31 PM   Clinical Narrative:     Received message from [facility] that they cannot accept referral for [facility]. Contacted [facility], spoke to Katelyn and requested CM fax orders, # [phone]. Contacted [address], Administrator, arts and they cannot process out of state Medicaid for tub bench. Contacted [facility], rep Ryan and will review. Will need to have tub bench shipped to home.   Faxed order for tub bench to [facility], # [phone].   Dtr will provide transportation home. Spoke to dtr, Kent Caldron and updated on [facility] and DME.  Final next level of care: Home w Home Health Services Barriers to Discharge: No Barriers Identified   Patient Goals and CMS Choice Patient states their goals for this hospitalization and ongoing recovery are:: wants to return home CMS Medicare.gov Compare Post Acute Care list provided to:: Patient Choice offered to / list presented to : Patient      Discharge Placement                       Discharge Plan and Services Additional resources added to the After Visit Summary for   In-house Referral: NA Discharge Planning Services: CM Consult Post Acute Care Choice: Home Health          DME Arranged: Tub bench DME Agency: [employer]       HH Arranged: PT, OT [facility] Agency: [facility] Date [facility] Agency Contacted: 08/23/23 Time HH Agency Contacted: [phone] Representative spoke with at [facility] Agency: Katelyn  Social Drivers of Health (SDOH) Interventions SDOH Screenings   Food Insecurity: No Food Insecurity (08/10/2023)  Housing: Unknown (08/14/2023)  Transportation Needs: No Transportation Needs (08/10/2023)  Utilities: Not At Risk (08/10/2023)  Social Connections: Unknown (08/10/2023)   Tobacco Use: High Risk (08/16/2023)     Readmission Risk Interventions     No data to display

## 2023-08-23 NOTE — Plan of Care (Signed)
  Problem: Education: Goal: Knowledge of General Education information will improve Description: Including pain rating scale, medication(s)/side effects and non-pharmacologic comfort measures Outcome: Progressing   Problem: Clinical Measurements: Goal: Ability to maintain clinical measurements within normal limits will improve Outcome: Progressing Goal: Respiratory complications will improve Outcome: Progressing Goal: Cardiovascular complication will be avoided Outcome: Progressing   Problem: Activity: Goal: Risk for activity intolerance will decrease Outcome: Progressing   Problem: Nutrition: Goal: Adequate nutrition will be maintained Outcome: Progressing   Problem: Elimination: Goal: Will not experience complications related to urinary retention Outcome: Progressing   Problem: Pain Managment: Goal: General experience of comfort will improve and/or be controlled Outcome: Progressing   Problem: Safety: Goal: Ability to remain free from injury will improve Outcome: Progressing   Problem: Skin Integrity: Goal: Risk for impaired skin integrity will decrease Outcome: Progressing   Problem: Cardiac: Goal: Ability to achieve and maintain adequate cardiovascular perfusion will improve Outcome: Progressing   Problem: Education: Goal: Will demonstrate proper wound care and an understanding of methods to prevent future damage Outcome: Progressing   Problem: Cardiac: Goal: Will achieve and/or maintain hemodynamic stability Outcome: Progressing   Problem: Clinical Measurements: Goal: Postoperative complications will be avoided or minimized Outcome: Progressing

## 2023-08-23 NOTE — Care Management Important Message (Signed)
 Important Message  Patient Details  Name: SAMARAH HOGLE MRN: [id number] Date of Birth: 1965/02/12   Important Message Given:  Yes - Medicare IM     Vonzell Arrie Sharps 08/23/2023, 10:05 AM

## 2023-08-23 NOTE — Progress Notes (Signed)
 CT sutures removed per order.  Left site unremarkable.  Middle and right sites unapproximated, middle site with significant serous ooze.  Steri's applied to all 3 sites, with gauze reinforcing middle site.  Pt tolerated well.  DC pending ride.

## 2023-08-23 NOTE — Progress Notes (Signed)
 Pt is dressed and ready for dc. I talked to pt about CR in [place], I will refer to [facility] in [place] . Pt denies questions about rehab and d/c instructions have been reviewed.   Lindsay Camacho Candy 08/23/2023 10:08 AM

## 2023-08-27 ENCOUNTER — Telehealth: Payer: Self-pay | Admitting: *Deleted

## 2023-08-27 ENCOUNTER — Telehealth (HOSPITAL_COMMUNITY): Payer: Self-pay

## 2023-08-27 ENCOUNTER — Ambulatory Visit (INDEPENDENT_AMBULATORY_CARE_PROVIDER_SITE_OTHER)

## 2023-08-27 DIAGNOSIS — I5022 Chronic systolic (congestive) heart failure: Secondary | ICD-10-CM

## 2023-08-27 NOTE — Telephone Encounter (Signed)
 Faxed outside referral for Phase II Cardiac Rehab to [facility].

## 2023-08-27 NOTE — Telephone Encounter (Signed)
 Call to [facility] was ordered by the inpatient doctor.  Dr. Viviane was the ordering physician.  [facility] needs to know if Dr. Norrine will follow the PT.  Informed them that the doctor will need to see patient in order to know what the PT order is needed for.  Patient had an appointment scheduled for the 8/27 for HFU.  Has cancelled.  Call to patient to inform her that she may need to be seen in the Clinics prior to starting the PT so that the [facility] can order the PT.  Copied from CRM [id number]. Topic: Clinical - Medical Advice >> Aug 27, 2023 11:11 AM Laurier BROCKS wrote: Reason for CRM:  Would like for Dr. Norrine to follow up on patient's PT home health with [facility] [phone]

## 2023-08-28 ENCOUNTER — Ambulatory Visit: Payer: Self-pay | Admitting: Student

## 2023-08-28 LAB — HEMOGLOBIN A1C

## 2023-08-29 ENCOUNTER — Inpatient Hospital Stay: Payer: Self-pay | Admitting: Student

## 2023-08-31 LAB — CUP PACEART REMOTE DEVICE CHECK
Battery Remaining Longevity: 7 mo
Battery Voltage: 2.82 V
Brady Statistic AP VP Percent: 86.68 %
Brady Statistic AP VS Percent: 0.84 %
Brady Statistic AS VP Percent: 11.91 %
Brady Statistic AS VS Percent: 0.58 %
Brady Statistic RA Percent Paced: 86.87 %
Brady Statistic RV Percent Paced: 98.07 %
Date Time Interrogation Session: 20250824143222
HighPow Impedance: 55 Ohm
Implantable Lead Connection Status: 753985
Implantable Lead Connection Status: 753985
Implantable Lead Connection Status: 753985
Implantable Lead Implant Date: 20190124
Implantable Lead Implant Date: 20190124
Implantable Lead Implant Date: 20190124
Implantable Lead Location: 753859
Implantable Lead Location: 753860
Implantable Lead Location: 753860
Implantable Lead Model: 3830
Implantable Lead Model: 5076
Implantable Lead Model: 6935
Implantable Pulse Generator Implant Date: 20190124
Lead Channel Impedance Value: 190 Ohm
Lead Channel Impedance Value: 247 Ohm
Lead Channel Impedance Value: 304 Ohm
Lead Channel Impedance Value: 380 Ohm
Lead Channel Impedance Value: 380 Ohm
Lead Channel Impedance Value: 380 Ohm
Lead Channel Pacing Threshold Amplitude: 0.5 V
Lead Channel Pacing Threshold Amplitude: 0.75 V
Lead Channel Pacing Threshold Amplitude: 0.875 V
Lead Channel Pacing Threshold Pulse Width: 0.4 ms
Lead Channel Pacing Threshold Pulse Width: 0.4 ms
Lead Channel Pacing Threshold Pulse Width: 0.5 ms
Lead Channel Sensing Intrinsic Amplitude: 1.375 mV
Lead Channel Sensing Intrinsic Amplitude: 1.375 mV
Lead Channel Sensing Intrinsic Amplitude: 4.625 mV
Lead Channel Sensing Intrinsic Amplitude: 4.625 mV
Lead Channel Setting Pacing Amplitude: 1.5 V
Lead Channel Setting Pacing Amplitude: 1.5 V
Lead Channel Setting Pacing Amplitude: 2.5 V
Lead Channel Setting Pacing Pulse Width: 0.4 ms
Lead Channel Setting Pacing Pulse Width: 0.5 ms
Lead Channel Setting Sensing Sensitivity: 0.3 mV
Zone Setting Status: 755011

## 2023-09-03 ENCOUNTER — Ambulatory Visit: Payer: Self-pay | Admitting: Internal Medicine

## 2023-09-03 NOTE — Progress Notes (Unsigned)
 Cardiology Office Note    Date:  09/05/2023  ID:  Lindsay Camacho, Lindsay Camacho September 27, 1965, MRN [id number] PCP:  Norrine Sharper, MD  Cardiologist:  Oneil Parchment, MD  Electrophysiologist:  Danelle Birmingham, MD   Chief Complaint: Follow up for CAD   History of Present Illness: .    Lindsay Camacho is a 58 y.o. female with visit-pertinent history of rtery stenting in 09/2021, CAD s/p PCI of LCx in 2015, chronic systolic heart failure s/p Medtronic CRT-D with improved EF, severe MR s/p repair in 2018, hyperlipidemia, tobacco abuse and left bundle branch block.  Patient was previously followed by Dr. Dann.  She had a lateral MI in 2015 treated PCI of the left circumflex artery.  Procedure was complicated by retroperitoneal bleed requiring vascular surgery.  Repeat left and right heart cath prior to valve surgery in August 2018 showed patent left circumflex stent with minimal restenosis and mild nonobstructive CAD in the RCA and LAD.  She underwent minimally invasive mitral valve repair by Dr. Dusty for severe symptomatic MR on 08/25/2016.  She underwent Medtronic CRT-D implantation in January 2019 for EF of 20 to 25%.  She was admitted at [facility]  in January 2021 for worsening shortness of breath due to acute on chronic systolic heart failure.  She underwent IV diuresis.  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  She underwent successful angioplasty and the balloon expandable stent placement to the left circumflex artery.  In March 2024 she was admitted with stroke that was thought to be due to small vessel disease.  MRI showed 8 mm acute infarct of the left corona radiata.  CTA showed widely patent left subclavian stent with nonobstructive disease in the carotid arteries.  Echo showed EF 55 to 60% with mild pulmonary hypertension and normal functioning mitral valve prosthesis, mild to moderate aortic stenosis.  Patient was  admitted from 12/28 - 12/31/2022 for chest pain.  She presented to [facility] on 12/26-24 after developing chest pain at rest.  BP upon arrival was 177/82.  She reported substernal chest discomfort and chest pain under her left armpit.  She also reported shortness of breath and headache but no fever or cough.  Chest discomfort worsened with deep inspiration.  Still high sensitive troponin was elevated but flat at 17>>20>>16, proBNP 2898.  CTA of chest showed no filling defects to suggest PE, moderate cardiomegaly with some right heart enlargement and some reflux of contrast into the hepatic vein which may be an indicator for right heart dysfunction, left subclavian artery stent noted, CAD noted, stable scarring of the right middle lobe and lateral right upper lobe.  She was given 1 dose of IV Lasix  40 mg and 40 mill equivalents of potassium and transferred to [facility].  Echo on 12/30/2022 showed EF 55%, mild LVH, annual plasty mitral ring with trivial residual MR, severe TR, mild AI and mild aortic stenosis.  CRP and ESR were elevated, she was treated for presumed acute pericarditis with colchicine .  Carvedilol  was switched to metoprolol .  Patient was seen in clinic on 03/30/2023 by Dr. Parchment.  Patient reported that she had discontinued Nexletol  due to cost and had stopped taking cholesterol medication due to adverse effects.  Patient was last seen on 05/22/2023 by Dr. Darron, patient reported recent episodes of palpitations and tachycardia, her Toprol  was increased to 50 mg daily and losartan  was decreased to 25 mg daily.  On 07/03/2023 patient  notified the office that she has gone to ED last night in [place] for blood pressure, they gave her aspirin  and nitroglycerin  and told her to contact her cardiologist.  While on the phone patient's blood pressure was 190/112 with heart rate of 79, patient reported she had not taken her losartan , patient was started on amlodipine  10 mg daily.  Patient was seen in clinic on  07/10/2023, she reported that with controlling her blood pressure her discomfort resolved.  She denied any further chest discomfort, shortness of breath, lower extremity edema, orthopnea or PND.  She reported that her blood pressure had been significantly better controlled at home.  Ischemic evaluation was discussed at that time however patient deferred given history of seizures with nuclear stress testing.  On 08/10/2023 patient presented to [facility] ED with chest pains concerning for angina, while in the ER she had an episode of chest pain followed by ICD shock, patient was in VF with appropriate shock.  Patient had cath in [place] with three-vessel disease, referred to [facility] for CT surgery evaluation. Echo on 8/10 showed LVEF 55 to 60%, no RWMA, mild LVH, normal MR, mild LA enlargement, mild MR/MS with mean gradient of 4.0 mmHg, moderate TR. on 8/14 patient underwent three-vessel CABG with LIMA to LAD, SVG to OM1, SVG to PDA.  Patient had post cardiotomy shock, TEE showed moderately reduced EF, mildly reduced RV function.  Patient also had atrial fibrillation after removal of introducer, was started on IV amiodarone , appears patient was to be continued on oral amiodarone  however was not prescribed at discharge. Patient was discharged in stable condition on 08/23/2023.  Patient's amlodipine , furosemide , losartan  and metoprolol  were held at discharge.  She was started on spironolactone  12.5 mg daily and Jardiance  10 mg daily.  Today she presents for follow-up.  She reports that she has been doing overall well, she has been trying to walk, notes discomfort at her mid sternal incision site that is improving notes some numbness. Denies any other chest pain or tightness, reports that her breathing is stable. She denies any dizziness or lightheadedness.  On further discussion with patient she reports that she was admitted to [facility] in [place] a few days following her discharge from [facility].   Patient reports that she had significantly low blood pressures and was admitted for monitoring, reports that she did not require blood transfusions and only med changes were discontinuation of Jardiance  and spironolactone , she reports that she was not restarted on any other medications.  She reports that she was instructed not to restart on Jardiance  or spironolactone  as both resulted in significant drops in blood pressure, reports that her systolic blood pressure dropped into the 70s resulting in her admission in [place].  Patient reports that with discontinuation of medications her blood pressure has significantly improved and has been consistently less than 120/80.  She denies any palpitations or feeling of irregular heartbeats. ROS: .   Today she denies chest pain, shortness of breath, lower extremity edema, fatigue, palpitations, melena, hematuria, hemoptysis, diaphoresis, weakness, presyncope, syncope, orthopnea, and PND.  All other systems are reviewed and otherwise negative. Studies Reviewed: SABRA   EKG:  EKG is ordered today, personally reviewed, demonstrating  EKG Interpretation Date/Time:  Wednesday September 05 2023 14:12:04 EDT Ventricular Rate:  88 PR Interval:  124 QRS Duration:  148 QT Interval:  456 QTC Calculation: 551 R Axis:   -66  Text Interpretation: Atrial-sensed ventricular-paced rhythm with frequent Premature ventricular complexes Biventricular pacemaker detected When  compared with ECG of 17-Aug-2023 07:11, Premature ventricular complexes are now Present Vent. rate has increased BY   4 BPM Confirmed by Willisha Sligar [phone]) on 09/05/2023 2:23:48 PM   CV Studies: Cardiac studies reviewed are outlined and summarized above. Otherwise please see EMR for full report. Cardiac Studies & Procedures   ______________________________________________________________________________________________ CARDIAC CATHETERIZATION  CARDIAC CATHETERIZATION 08/21/2016  Conclusion  Ost RCA to Prox  RCA lesion, 10 %stenosed.  Dist RCA lesion, 30 %stenosed.  Prox Cx to Mid Cx lesion, 10 %stenosed.  Prox LAD to Mid LAD lesion, 30 %stenosed.  1. Patent stent in the Circumflex artery with minimal restenosis 2. Mild non-obstructive disease in the RCA and LAD (of note, there was spasm in the RCA with catheter engagement that resolved with SL NTG)  Recommendations: Medical management of CAD. Continue planning for valve repair/replacement.  Findings Coronary Findings Diagnostic  Dominance: Right  Left Anterior Descending Vessel is large.  First Diagonal Branch Vessel is moderate in size.  First Septal Branch Vessel is small in size.  Second Diagonal Branch Vessel is moderate in size.  Second Septal Branch Vessel is small in size.  Third Diagonal Branch Vessel is small in size.  Third Septal Branch Vessel is small in size.  Left Circumflex Vessel is large. The lesion was previously treated using a stent (unknown type) over 2 years ago.  First Obtuse Marginal Branch Vessel is small in size.  Second Obtuse Marginal Branch Vessel is large in size.  Third Obtuse Marginal Branch Vessel is small in size.  Right Coronary Artery  Intervention  No interventions have been documented.   CARDIAC CATHETERIZATION  CARDIAC CATHETERIZATION 01/15/2015   STRESS TESTS  MYOCARDIAL PERFUSION IMAGING 11/10/2015  Interpretation Summary  The left ventricular ejection fraction is severely decreased (<30%).  Nuclear stress EF: 29%.  There was no ST segment deviation noted during stress.  This is a high risk study due to reduced systolic function.  No ischemia identified.   ECHOCARDIOGRAM  ECHOCARDIOGRAM LIMITED 08/20/2023  Narrative ECHOCARDIOGRAM LIMITED REPORT    Patient Name:   Lindsay Camacho Date of Exam: 08/20/2023 Medical Rec #:  [id number]     Height:       61.0 in Accession #:    [id number]    Weight:       111.8 lb Date of Birth:  1965/10/14     BSA:           1.475 m Patient Age:    58 years      BP:           116/70 mmHg Patient Gender: F             HR:           84 bpm. Exam Location:  Inpatient  Procedure: Cardiac Doppler, Limited Color Doppler and Limited Echo (Both Spectral and Color Flow Doppler were utilized during procedure).  Indications:     CAD native vessel I25.10  History:         Patient has prior history of Echocardiogram examinations, most recent 08/16/2023. CHF, CAD and Previous Myocardial Infarction, Prior CABG, Pacemaker and Defibrillator, Mitral Ring valve present in mitral position. Procedure date: 08/25/2016; Risk Factors:Dyslipidemia and Hypertension.  Sonographer:     Koleen Popper RDCS Referring Phys:  [id number] DANIEL R BENSIMHON Diagnosing Phys: Ezra Kanner   Sonographer Comments: Image acquisition challenging due to patient body habitus and Image acquisition challenging due to respiratory motion. IMPRESSIONS   1. Left ventricular ejection  fraction, by estimation, is 60 to 65%. The left ventricle has normal function. The left ventricle has no regional wall motion abnormalities. There is mild concentric left ventricular hypertrophy. Left ventricular diastolic parameters are indeterminate. 2. D-shaped septum suggestive of RV pressure/volume overload. Peak RV-RA gradient 21 mmHg. Right ventricular systolic function is normal. The right ventricular size is mildly enlarged. The RV was not well-visualized. 3. Status post mitral valve repair. Mean gradient 4 mmHg suggests that there is not significant mitral stenosis present. No significant mitral regurgitation noted. 4. The tricuspid valve is abnormal. Tricuspid valve regurgitation is severe, ?impingement from ICD but tricuspid valve was not well-visualized. 5. The aortic valve is tricuspid. Aortic valve regurgitation is mild. No aortic stenosis is present. 6. IVC not visualized. 7. A small pericardial effusion is present. 8. Technically difficult study with poor  windows. No subcostal views.  FINDINGS Left Ventricle: Left ventricular ejection fraction, by estimation, is 60 to 65%. The left ventricle has normal function. The left ventricle has no regional wall motion abnormalities. The left ventricular internal cavity size was normal in size. There is mild concentric left ventricular hypertrophy. Left ventricular diastolic parameters are indeterminate.  Right Ventricle: D-shaped septum suggestive of RV pressure/volume overload. Peak RV-RA gradient 21 mmHg. The right ventricular size is mildly enlarged. Right vetricular wall thickness was not well visualized. Right ventricular systolic function is normal.  Pericardium: A small pericardial effusion is present.  Mitral Valve: Status post mitral valve repair. Mean gradient 4 mmHg suggests that there is not significant mitral stenosis present. No significant mitral regurgitation noted. The mean mitral valve gradient is 4.0 mmHg.  Tricuspid Valve: The tricuspid valve is abnormal. Tricuspid valve regurgitation is severe.  Aortic Valve: The aortic valve is tricuspid. Aortic valve regurgitation is mild. No aortic stenosis is present.  Aorta: The aortic root is normal in size and structure.  Venous: IVC not visualized.  Additional Comments: A device lead is visualized in the right ventricle. Spectral Doppler performed. Color Doppler performed.  LEFT VENTRICLE PLAX 2D LVIDd:         3.90 cm LVIDs:         2.60 cm LV PW:         1.00 cm LV IVS:        1.20 cm LVOT diam:     1.60 cm LV SV:         48 LV SV Index:   33 LVOT Area:     2.01 cm   LEFT ATRIUM         Index LA diam:    3.60 cm 2.44 cm/m AORTIC VALVE LVOT Vmax:   150.00 cm/s LVOT Vmean:  93.800 cm/s LVOT VTI:    0.239 m  AORTA Ao Root diam: 2.60 cm  MITRAL VALVE                TRICUSPID VALVE MV Area (PHT): 3.21 cm     TR Peak grad:   20.8 mmHg MV Mean grad:  4.0 mmHg     TR Vmax:        228.00 cm/s MV Decel Time: 236 msec MV E  velocity: 126.00 cm/s  SHUNTS MV A velocity: 116.00 cm/s  Systemic VTI:  0.24 m MV E/A ratio:  1.09         Systemic Diam: 1.60 cm  Dalton McleanMD Electronically signed by Ezra Kanner Signature Date/Time: 08/20/2023/8:02:19 PM    Final (Updated)   TEE  ECHO INTRAOPERATIVE TEE 08/16/2023  Narrative *  INTRAOPERATIVE TRANSESOPHAGEAL REPORT *    Patient Name:   Lindsay Camacho Date of Exam: 08/16/2023 Medical Rec #:  [id number]     Height:       61.0 in Accession #:    [id number]    Weight:       110.7 lb Date of Birth:  Jun 08, 1965     BSA:          1.47 m Patient Age:    58 years      BP:           153/88 mmHg Patient Gender: F             HR:           60 bpm. Exam Location:  Inpatient  Transesophogeal exam was perform intraoperatively during surgical procedure. Patient was closely monitored under general anesthesia during the entirety of examination.  Indications:     CABG Performing Phys: [id number] STEVEN C HENDRICKSON  Complications: No known complications during this procedure. POST-OP IMPRESSIONS _ Left Ventricle: has moderately reduced systolic function, with an ejection fraction of 35%. _ Right Ventricle: The right ventricle appears unchanged from pre-bypass. _ Aorta: The aorta appears unchanged from pre-bypass. _ Left Atrium: The left atrium appears unchanged from pre-bypass. _ Left Atrial Appendage: The left atrial appendage appears unchanged from pre-bypass. _ Aortic Valve: The aortic valve appears unchanged from pre-bypass. _ Mitral Valve: The mitral valve appears unchanged from pre-bypass. _ Tricuspid Valve: The tricuspid valve appears unchanged from pre-bypass. _ Pulmonic Valve: The pulmonic valve appears unchanged from pre-bypass. _ Interatrial Septum: The interatrial septum appears unchanged from pre-bypass. _ Interventricular Septum: The interventricular septum appears unchanged from pre-bypass. _ Pericardium: The pericardium appears unchanged from  pre-bypass.  PRE-OP  FINDINGS Left Ventricle: The left ventricle has hyperdynamic systolic function, with an ejection fraction of >65%. The cavity size was normal. There is no left ventricular hypertrophy.   Right Ventricle: The right ventricle has normal systolic function. The cavity was normal. There is no increase in right ventricular wall thickness.  Left Atrium: Left atrial size was normal in size. No left atrial/left atrial appendage thrombus was detected.  Right Atrium: Right atrial size was normal in size.  Interatrial Septum: No atrial level shunt detected by color flow Doppler.  Pericardium: There is no evidence of pericardial effusion.  Mitral Valve: The mitral valve has been repaired/replaced. Mitral valve regurgitation is not visualized by color flow Doppler. There is Mild mitral stenosis.  Tricuspid Valve: The tricuspid valve was normal in structure. Tricuspid valve regurgitation is moderate by color flow Doppler. No evidence of tricuspid stenosis is present.  Aortic Valve: The aortic valve is tricuspid Aortic valve regurgitation is mild by color flow Doppler. There is no stenosis of the aortic valve. There is mild thickening and mild calcification present on the aortic valve right coronary, left coronary and non-coronary cusps with normal mobility.  Pulmonic Valve: The pulmonic valve was normal in structure. Pulmonic valve regurgitation is trivial by color flow Doppler.   Aorta: The aortic root, ascending aorta and aortic arch are normal in size and structure.  +--------------+--------++ LEFT VENTRICLE         +--------------+--------++ PLAX 2D                +--------------+--------++ LVIDd:        3.10 cm  +--------------+--------++ LVIDs:        1.20 cm  +--------------+--------++ LV PW:        0.60 cm  +--------------+--------++  LV IVS:       0.60 cm  +--------------+--------++ LVOT diam:    1.70 cm   +--------------+--------++ LV SV:        35 ml    +--------------+--------++ LV SV Index:  23.48    +--------------+--------++ LVOT Area:    2.27 cm +--------------+--------++                        +--------------+--------++  +-------------+-----------++ AORTIC VALVE             +-------------+-----------++ AV Vmax:     136.00 cm/s +-------------+-----------++ AV Peak Grad:7.4 mmHg    +-------------+-----------++  +-------------+---------++ MITRAL VALVE           +--------------+-------+ +-------------+---------++ SHUNTS                MV Peak grad:2.1 mmHg  +--------------+-------+ +-------------+---------++ Systemic Diam:1.70 cm MV Mean grad:1.0 mmHg  +--------------+-------+ +-------------+---------++ MV Vmax:     0.73 m/s  +-------------+---------++ MV Vmean:    56.6 cm/s +-------------+---------++ MV VTI:      0.25 m    +-------------+---------++   Lamar Needle MD Electronically signed by Lamar Needle MD Signature Date/Time: 08/19/2023/8:28:45 AM    Final        ______________________________________________________________________________________________       Current Reported Medications:.    Current Meds  Medication Sig   acetaminophen  (TYLENOL ) 325 MG tablet Take 2 tablets (650 mg total) by mouth every 6 (six) hours as needed.   aspirin  EC 81 MG tablet Take 81 mg by mouth daily.   Bempedoic Acid -Ezetimibe  (NEXLIZET ) 180-10 MG TABS Take 1 tablet by mouth daily.   budesonide -glycopyrrolate -formoterol  (BREZTRI ) 160-9-4.8 MCG/ACT AERO inhaler Inhale 2 puffs into the lungs 2 (two) times daily.   clopidogrel  (PLAVIX ) 75 MG tablet TAKE 1 TABLET BY MOUTH DAILY   Evolocumab  (REPATHA  SURECLICK) 140 MG/ML SOAJ Inject 140 mg into the skin every 14 (fourteen) days.   Fe Fum-Vit C-Vit B12-FA (TRIGELS-F FORTE) CAPS capsule Take 1 capsule by mouth daily after breakfast.   magnesium  gluconate  (MAGONATE) 500 (27 Mg) MG TABS tablet Take 1 tablet (500 mg total) by mouth daily.   Menthol-Methyl Salicylate (SALONPAS PAIN RELIEF PATCH EX) Apply 1 patch topically daily as needed (pain).   oxyCODONE  (OXY IR/ROXICODONE ) 5 MG immediate release tablet Take 1 tablet (5 mg total) by mouth every 6 (six) hours as needed for up to 7 days for severe pain (pain score 7-10).   rosuvastatin  (CRESTOR ) 40 MG tablet Take 1 tablet (40 mg total) by mouth daily.   VENTOLIN  HFA 108 (90 Base) MCG/ACT inhaler Inhale 1-2 puffs into the lungs every 4 (four) hours as needed for shortness of breath.    Physical Exam:    VS:  BP 110/72   Pulse 82   Ht 5' 1 (1.549 m)   Wt 113 lb (51.3 kg)   SpO2 98%   BMI 21.35 kg/m    Wt Readings from Last 3 Encounters:  09/05/23 113 lb (51.3 kg)  08/23/23 106 lb 11.2 oz (48.4 kg)  07/10/23 109 lb 6.4 oz (49.6 kg)    GEN: Well nourished, well developed in no acute distress NECK: No JVD; No carotid bruits CARDIAC: RRR, 2/6 holosystolic murmur, no rubs or gallops RESPIRATORY:  Clear to auscultation without rales, wheezing or rhonchi  ABDOMEN: Soft, non-tender, non-distended EXTREMITIES:  No edema; No acute deformity     Asessement and Plan:.    CAD: History of lateral MI in  2015 treated with PCI to LCx.  Procedure complicated by pretrip peritoneal bleeding requiring vascular surgery.  Repeat left and right heart cath prior to valve surgery in August 2018 showed patent left circumflex stent with minimal restenosis and mild nonobstructive CAD in the RCA and LAD.  Patient presented to [facility] with NSTEMI and had ICD shock while in the ED for VF.  Cath reported from [facility] showed three-vessel disease involving the LAD, LCx, RCA.  Normal LVEDP, LM normal, LAD 80 to 90%, D1 70 to 80%, LCx 78% lesion at stent edge, OM1 60 to 70%, RCA proximal 20 to 30% with diffuse haziness in the mid segment which could recommend thrombus, distal RCA 99%. On 8/14 patient underwent three-vessel  CABG with LIMA to LAD, SVG to OM1, SVG to PDA.  Patient had post cardiotomy shock, TEE showed moderately reduced EF, mildly reduced RV function.  Patient reports that she is doing well, notes that she was admitted to [facility] in [place], [place] a few days after her discharge from [facility] in setting of hypotension, her Jardiance  and spironolactone  were discontinued.  Unfortunately I am unable to review notes from this admission, will request records.  Today patient reports some mild discomfort at her sternal site, notes that this has not been improving, notes she has had ongoing numbness at the surgical incision that bothers her more.  She denies any significant shortness of breath, reports this is at her baseline.  She denies any increased lower extremity edema, orthopnea or PND.  Will defer approval to start cardiac rehab to CT surgery team. Check CBC and BMET today.  Reviewed ED precautions. Continue   Post op afib: Noted to have episode of atrial fibrillation after removal of introducer.  Patient was started on amiodarone  200 mg twice daily, on chart review appears that this was post to be continued at discharge however was not ordered.  She notes she has not interested on starting on anticoagulation at this time, will discuss with Dr. Waddell on follow-up. Will reach out to Dr. Waddell regarding starting of amiodarone .   S/p MV repair: Patient with history of MV repair in 2018.  Echo during admission indicated mild MR/MS with mean gradient of 4.0 mmHg, 26 mm Celestia Hoof Adams prosthetic anoplasty ring present in the mitral position.  Reviewed SBE precautions.  Tricuspid regurgitation: Severe on limited echo on 8/18, functional in setting of dilated RV.  Continue to monitor on repeat echos.   ICD: Medtronic.  S/p ICD shock at [facility] on 8/7 for VF in setting of NSTEMI.  Heart failure improved EF: Initially patient underwent Medtronic CRT-D implant in January 2019 for EF of 2025%. Echo 8/10  LVEF of 55-60%, no rWMA, mild LVH, normal RV, mild LA enlargement, mild MR/MS with mean gradient of 4.91mmHg, moderate TR. patient followed by advanced heart failure team post CABG given volume overload and post cardiotomy shock.  Repeat echo on 8/18 indicated EF 60 to 65%, D-shaped septum, RV mildly enlarged with normal SFX, severe TR.  Patient was started on Jardiance  10 mg daily and spironolactone  12.5 mg daily, per patient she was admitted to [facility] as noted above given severe hypotension.  Jardiance  and spironolactone  were discontinued.  Patient reports that her breathing is at baseline, denies any increased lower extremity edema, orthopnea or PND.  She appears euvolemic and well compensated on exam.  Will not escalate GDMT at this time as do not feel that her blood pressures will tolerate this.  Reviewed ED  precautions.   Hyperlipidemia: Last lipid profile in 08/2023 indicated total cholesterol 221, triglycerides 51, HDL 49 and LDL 162 while on Nexletol  and atorvastatin .  Patient was seen by Pharm.D. this morning with plans to start Repatha .  She is to have repeat fasting lipid profile in October.  History of left subclavian stenosis/stenting: Duplex during admission with left subclavian stenosis and mild bilateral carotid disease.    Disposition: F/u with Dr. Jeffrie on 10/09/23 as scheduled   Signed, Kenleigh Toback D Makayia Duplessis, NP

## 2023-09-04 ENCOUNTER — Telehealth: Payer: Self-pay | Admitting: *Deleted

## 2023-09-04 NOTE — Progress Notes (Signed)
Remote ICD Transmission.

## 2023-09-04 NOTE — Telephone Encounter (Unsigned)
 Copied from CRM [id number]. Topic: Clinical - Medical Advice >> Aug 31, 2023 12:26 PM Cherylann RAMAN wrote: Reason for CRM: Kaitlyn, [facility],  requesting provider to follow up with the patient's Home Health plan. May contact Kaitlyn at [phone] for additional information.

## 2023-09-04 NOTE — Telephone Encounter (Signed)
 Copied from CRM [id number]. Topic: Clinical - Medical Advice >> Aug 31, 2023 12:26 PM Cherylann RAMAN wrote: Reason for CRM: Kaitlyn, [facility],  requesting provider to follow up with the patient's Home Health plan. May contact Kaitlyn at [phone] for additional information.

## 2023-09-05 ENCOUNTER — Other Ambulatory Visit (HOSPITAL_COMMUNITY): Payer: Self-pay

## 2023-09-05 ENCOUNTER — Encounter: Payer: Self-pay | Admitting: Pharmacist

## 2023-09-05 ENCOUNTER — Ambulatory Visit: Attending: Cardiovascular Disease | Admitting: Cardiology

## 2023-09-05 ENCOUNTER — Telehealth: Payer: Self-pay | Admitting: Pharmacy Technician

## 2023-09-05 ENCOUNTER — Ambulatory Visit (INDEPENDENT_AMBULATORY_CARE_PROVIDER_SITE_OTHER): Admitting: Pharmacist

## 2023-09-05 ENCOUNTER — Encounter: Payer: Self-pay | Admitting: Cardiology

## 2023-09-05 ENCOUNTER — Telehealth: Payer: Self-pay | Admitting: Pharmacist

## 2023-09-05 VITALS — BP 110/72 | HR 82 | Ht 61.0 in | Wt 113.0 lb

## 2023-09-05 DIAGNOSIS — Z79899 Other long term (current) drug therapy: Secondary | ICD-10-CM

## 2023-09-05 DIAGNOSIS — I5022 Chronic systolic (congestive) heart failure: Secondary | ICD-10-CM

## 2023-09-05 DIAGNOSIS — I2510 Atherosclerotic heart disease of native coronary artery without angina pectoris: Secondary | ICD-10-CM

## 2023-09-05 DIAGNOSIS — E782 Mixed hyperlipidemia: Secondary | ICD-10-CM

## 2023-09-05 DIAGNOSIS — I480 Paroxysmal atrial fibrillation: Secondary | ICD-10-CM | POA: Diagnosis not present

## 2023-09-05 DIAGNOSIS — E785 Hyperlipidemia, unspecified: Secondary | ICD-10-CM

## 2023-09-05 DIAGNOSIS — I071 Rheumatic tricuspid insufficiency: Secondary | ICD-10-CM | POA: Diagnosis not present

## 2023-09-05 DIAGNOSIS — Z95810 Presence of automatic (implantable) cardiac defibrillator: Secondary | ICD-10-CM

## 2023-09-05 DIAGNOSIS — I771 Stricture of artery: Secondary | ICD-10-CM

## 2023-09-05 DIAGNOSIS — Z9889 Other specified postprocedural states: Secondary | ICD-10-CM | POA: Diagnosis not present

## 2023-09-05 MED ORDER — NEXLIZET 180-10 MG PO TABS
1.0000 | ORAL_TABLET | Freq: Every day | ORAL | 3 refills | Status: AC
Start: 2023-09-05 — End: ?
  Filled 2023-09-05 (×3): qty 90, 90d supply, fill #0

## 2023-09-05 MED ORDER — REPATHA SURECLICK 140 MG/ML ~~LOC~~ SOAJ
140.0000 mg | SUBCUTANEOUS | 3 refills | Status: AC
  Filled 2023-09-05: qty 6, 84d supply, fill #0

## 2023-09-05 NOTE — Patient Instructions (Signed)
 Medication Instructions:  Your physician recommends that you continue on your current medications as directed. Please refer to the Current Medication list given to you today.  *If you need a refill on your cardiac medications before your next appointment, please call your pharmacy*  Lab Work: CBC, CMET, MAGNESIUM , TSH- TODAY If you have labs (blood work) drawn today and your tests are completely normal, you will receive your results only by: MyChart Message (if you have MyChart) OR A paper copy in the mail If you have any lab test that is abnormal or we need to change your treatment, we will call you to review the results.   Follow-Up: KEEP UPCOMING APPOINTMENTS

## 2023-09-05 NOTE — Telephone Encounter (Signed)
 Pharmacy Patient Advocate Encounter   Received notification from Physician's Office that prior authorization for repatha  is required/requested.   Insurance verification completed.   The patient is insured through [employer] .   Per test claim: PA required; PA submitted to above mentioned insurance via Latent Key/confirmation #/EOC [id number] Status is pending

## 2023-09-05 NOTE — Progress Notes (Signed)
 Patient ID: Lindsay Camacho                 DOB: 15-Jul-1965                    MRN: [id number]      HPI: Lindsay Camacho is a 58 y.o. female patient referred to lipid clinic by Lindsay Camacho. PMH is significant for recent NSTEMI, artery stenting in 09/2021, CAD s/p PCI of LCx in 2015, chronic systolic heart failure s/p Medtronic CRT-D with improved EF, severe MR s/p repair in 2018, hyperlipidemia, tobacco abuse and left bundle branch block, hx of TIA.  Patient currently taking Nexletol  180 mg, Zetia  10 mg and Crestor  40 mg.  She was previously prescribed atorvastatin  80 mg, Leqvio  284 mg, and Praluent  75 mg (leg pain) but unable to tolerate. Patient reports that food options are limited due to dental issues but states that she rarely eats out and incorporates fruits and vegetables in her diet. She quit smoking and alcohol  in 08/2023 and has been doing well abstaining from both.  Reviewed options for lowering LDL cholesterol, including ezetimibe , PCSK-9 inhibitors, bempedoic acid  and inclisiran.  Discussed mechanisms of action, dosing, side effects and potential decreases in LDL cholesterol.  Also reviewed cost information and potential options for patient assistance.   Current Medications: Nexletol  180 mg once daily, Zetia  10 mg once daily and Crestor  40 mg once daily Previously tried - Crestor , Lipitor , Leqvio , Praluent - legs numb, Nexletol  Risk Factors: premature CAD, recent NSTEMI s/p CABGX3, artery stenting in 09/2021, s/p PCI of LCx in 2015, hx of TIA  LDL goal: <55 mg/dl TG <849 mg/dl  Last lab: 91/7974 TC 778, TG 51, HDL 49, LDL 162 on Nexletol  and atorvastatin   Diet: Due to dental issues, food options are limited  Breakfast: yogurt and scrambled eggs Lunch/Snack: beans, grapes and peas  Exercise: Recovering from recent ACS No structured exercise - Pt will be starting PT next month  Family History:  Relation Problem Comments  Mother (Deceased at age 34) Bladder Cancer   CAD (Age: 23)   Heart  attack   Heart disease Before age 53  Hypertension   Lung cancer   Stroke     Father (Deceased at age 108) CAD (Age: 108)   Heart attack   Heart disease Before age 54  Stroke Bleeding stroke     Social History: Alcohol : none Smoking: none; quit 08/2023  Labs:  Lipid Panel     Component Value Date/Time   CHOL 221 (H) 08/11/2023 0445   CHOL 132 03/07/2019 0923   TRIG 51 08/11/2023 0445   HDL 49 08/11/2023 0445   HDL 40 03/07/2019 0923   CHOLHDL 4.5 08/11/2023 0445   VLDL 10 08/11/2023 0445   LDLCALC 162 (H) 08/11/2023 0445   LDLCALC 79 03/07/2019 0923   LABVLDL 13 03/07/2019 0923    Past Medical History:  Diagnosis Date   Acute myocardial infarction of other lateral wall, initial episode of care 01/2013   DES CFX   Aortoiliac occlusive disease (HCC) 08/22/2016   CAD (coronary artery disease) 11/13/2011   50% ? proximal LCx stenosis per Cath at [facility]   Chest pain, atypical, muscular skelatal   01/09/2014   COVID 02/08/2020   Dyspnea    Heart failure (HCC)    HLD (hyperlipidemia)    Hypertension    Intermediate coronary syndrome (HCC)    LBBB (left bundle branch block)    Mitral regurgitation    Nonischemic  cardiomyopathy (HCC) 01/26/2017   Presence of combination internal cardiac defibrillator (ICD) and pacemaker    PVD (peripheral vascular disease) (HCC)    Right CFA stenosis per report on 11/14/2011   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/25/2016   Edwards McArthy-Adams IMR ETLogix ring annuloplasty (Model 4100, Serial [id number], size 26) placed via right mini thoracotomy approach   Stenosis of left subclavian artery (HCC) 08/22/2016   Stroke (HCC)    tia with no deficits    Current Outpatient Medications on File Prior to Visit  Medication Sig Dispense Refill   acetaminophen  (TYLENOL ) 325 MG tablet Take 2 tablets (650 mg total) by mouth every 6 (six) hours as needed.     aspirin  EC 81 MG tablet Take 81 mg by mouth daily.      budesonide -glycopyrrolate -formoterol  (BREZTRI ) 160-9-4.8 MCG/ACT AERO inhaler Inhale 2 puffs into the lungs 2 (two) times daily. 1 each 1   clopidogrel  (PLAVIX ) 75 MG tablet TAKE 1 TABLET BY MOUTH DAILY 90 tablet 3   empagliflozin  (JARDIANCE ) 10 MG TABS tablet Take 1 tablet (10 mg total) by mouth daily. 30 tablet 1   Fe Fum-Vit C-Vit B12-FA (TRIGELS-F FORTE) CAPS capsule Take 1 capsule by mouth daily after breakfast. 30 capsule 1   magnesium  gluconate (MAGONATE) 500 (27 Mg) MG TABS tablet Take 1 tablet (500 mg total) by mouth daily. 7 tablet 0   Menthol-Methyl Salicylate (SALONPAS PAIN RELIEF PATCH EX) Apply 1 patch topically daily as needed (pain).     rosuvastatin  (CRESTOR ) 40 MG tablet Take 1 tablet (40 mg total) by mouth daily. 30 tablet 1   spironolactone  (ALDACTONE ) 25 MG tablet Take 0.5 tablets (12.5 mg total) by mouth daily. 45 tablet 3   VENTOLIN  HFA 108 (90 Base) MCG/ACT inhaler Inhale 1-2 puffs into the lungs every 4 (four) hours as needed for shortness of breath. 1 each 0   No current facility-administered medications on file prior to visit.    Allergies  Allergen Reactions   Aldomet [Methyldopa] Other (See Comments)    Severe hypotension   Brilinta  [Ticagrelor ] Shortness Of Breath   Other Other (See Comments)    Nuclear Stress Test Medication caused seizures   Coumadin  [Warfarin Sodium ] Swelling    Arm swelling   Desyrel [Trazodone] Other (See Comments)    Could not wake up from medication, comatosed   Eliquis [Apixaban] Other (See Comments)    BP got too low   Entresto  [Sacubitril -Valsartan ] Hypertension    Hypotension    Zestril  [Lisinopril ]     Significant blood pressure fluctuations    Penicillins Rash    Assessment/Plan:  1. Hyperlipidemia -  Problem  Hyperlipidemia Ldl Goal <55   Current Medications: Nexlizet  180 mg/10 mg once daily, Repatha  140 mg every 2 weeks and Crestor  40 mg once daily Previously tried - Crestor , Lipitor , Leqvio , Praluent - legs numb,  Nexletol  Risk Factors: premature CAD, recent NSTEMI s/p CABGX3, artery stenting in 09/2021, s/p PCI of LCx in 2015, hx of TIA  LDL goal: <55 mg/dl TG <849 mg/dl  Last lab: 91/7974 TC 778, TG 51, HDL 49, LDL 162 on Nexletol  and atorvastatin      Hyperlipidemia LDL goal <55 Assessment:  LDL goal: <55 mg/dl last LDLc 837 mg/dl (91/7974) Atorvastatin  was switched to rosuvastatin  post ACS at discharge Nexletol  was continued same as before  Patient tolerating both statin and Nexletol  well without side effects  Discussed next potential options (Repatha ); cost, dosing efficacy, side effects  Reiterated importance of lifestyle modifications To reduce  pill burden, will replace Nexletol  and ezetimibe  with Nexlizet   Plan: Continue taking current medications (bempedoic acid , rosuvastatin  and ezetimibe ) Begin Repatha  140 mg under the skin every 14 days  Start Nexlizet  180 mg/10 mg daily and stop taking nexletol  and ezetimibe   Lipid lab due Oct 31st after starting Repatha     Thank you,  Robbi Blanch, Pharm.D [facility]. [facility] [address], [place], [place] [address] Phone: [phone]; Fax: [phone]

## 2023-09-05 NOTE — Telephone Encounter (Signed)
 Pharmacy Patient Advocate Encounter  Received notification from [employer] that Prior Authorization for Repatha  has been APPROVED from 09/05/23 to 01/02/24. Ran test claim, Copay is $0.00- one month. This test claim was processed through [facility]- copay amounts may vary at other pharmacies due to pharmacy/plan contracts, or as the patient moves through the different stages of their insurance plan.   PA #/Case ID/Reference #: [id number]

## 2023-09-05 NOTE — Telephone Encounter (Signed)
 PA for Repatha  approved see OV notes for more info.

## 2023-09-05 NOTE — Assessment & Plan Note (Signed)
 Assessment:  LDL goal: <55 mg/dl last LDLc 837 mg/dl (91/7974) Atorvastatin  was switched to rosuvastatin  post ACS at discharge Nexletol  was continued same as before  Patient tolerating both statin and Nexletol  well without side effects  Discussed next potential options (Repatha ); cost, dosing efficacy, side effects  Reiterated importance of lifestyle modifications To reduce pill burden, will replace Nexletol  and ezetimibe  with Nexlizet   Plan: Continue taking current medications (bempedoic acid , rosuvastatin  and ezetimibe ) Begin Repatha  140 mg under the skin every 14 days  Start Nexlizet  180 mg/10 mg daily and stop taking nexletol  and ezetimibe   Lipid lab due Oct 31st after starting Repatha 

## 2023-09-05 NOTE — Patient Instructions (Signed)
 Your Results:             Your most recent labs Goal  Total Cholesterol 221 < 200  Triglycerides 51 < 150  HDL (happy/good cholesterol) 49 > 40  LDL (lousy/bad cholesterol 162 < 55   Medication changes: Start Repatha  140 mg every 14 days under the skin Start Nexlizet  180 mg/10 mg once daily  Continue rosuvastatin  40 mg once daily     Repatha  is a cholesterol medication that improved your body's ability to get rid of bad cholesterol known as LDL. It can lower your LDL up to 60%! It is an injection that is given under the skin every 2 weeks. The medication often requires a prior authorization from your insurance company.   The most common side effects of Repatha  include runny nose, symptoms of the common cold, rarely flu or flu-like symptoms, back/muscle pain in about 3-4% of the patients, and redness, pain, or bruising at the injection site.  Lab orders: We want to repeat labs after 2-3 months.  We will send you a lab order to remind you once we get closer to that time.

## 2023-09-06 ENCOUNTER — Ambulatory Visit: Payer: Self-pay | Admitting: Cardiology

## 2023-09-06 ENCOUNTER — Encounter: Payer: Self-pay | Admitting: Cardiology

## 2023-09-06 LAB — COMPREHENSIVE METABOLIC PANEL WITH GFR
ALT: 49 IU/L — AB (ref 0–32)
AST: 44 IU/L — AB (ref 0–40)
Albumin: 3.9 g/dL (ref 3.8–4.9)
Alkaline Phosphatase: 116 IU/L (ref 44–121)
BUN/Creatinine Ratio: 20 (ref 9–23)
BUN: 15 mg/dL (ref 6–24)
Bilirubin Total: 0.7 mg/dL (ref 0.0–1.2)
CO2: 24 mmol/L (ref 20–29)
Calcium: 8.8 mg/dL (ref 8.7–10.2)
Chloride: 102 mmol/L (ref 96–106)
Creatinine, Ser: 0.74 mg/dL (ref 0.57–1.00)
Globulin, Total: 2.9 g/dL (ref 1.5–4.5)
Glucose: 75 mg/dL (ref 70–99)
Potassium: 4.8 mmol/L (ref 3.5–5.2)
Sodium: 139 mmol/L (ref 134–144)
Total Protein: 6.8 g/dL (ref 6.0–8.5)
eGFR: 94 mL/min/1.73 (ref >59)

## 2023-09-06 LAB — CBC
Hematocrit: 32.5 % — ABNORMAL LOW (ref 34.0–46.6)
Hemoglobin: 10.5 g/dL — ABNORMAL LOW (ref 11.1–15.9)
MCH: 31.7 pg (ref 26.6–33.0)
MCHC: 32.3 g/dL (ref 31.5–35.7)
MCV: 98 fL — ABNORMAL HIGH (ref 79–97)
Platelets: 319 x10E3/uL (ref 150–450)
RBC: 3.31 x10E6/uL — ABNORMAL LOW (ref 3.77–5.28)
RDW: 16.2 % — ABNORMAL HIGH (ref 11.7–15.4)
WBC: 7 x10E3/uL (ref 3.4–10.8)

## 2023-09-06 LAB — TSH: TSH: 2.77 u[IU]/mL (ref 0.450–4.500)

## 2023-09-06 LAB — MAGNESIUM: Magnesium: 1.9 mg/dL (ref 1.6–2.3)

## 2023-09-10 ENCOUNTER — Telehealth: Payer: Self-pay | Admitting: Cardiology

## 2023-09-10 MED ORDER — AMIODARONE HCL 200 MG PO TABS: 200.0000 mg | ORAL_TABLET | Freq: Two times a day (BID) | ORAL | 0 refills | Status: DC

## 2023-09-10 NOTE — Telephone Encounter (Signed)
 Please let Lindsay Camacho know that I spoke with Lindsay Camacho, he recommended she take amiodarone  200 mg twice daily for four weeks then discontinue amiodarone . He will continue to monitor her atrial fibrillation burden on her device. Covering staff please order Lindsay Camacho amiodarone  200 mg twice daily for four weeks. She should follow up with Lindsay Camacho on 09/14/23 as scheduled. Thank you!

## 2023-09-10 NOTE — Telephone Encounter (Signed)
 Spoke with pt, aware of the recommendations. New script sent to the pharmacy. She is aware of the appointment Friday this week.

## 2023-09-14 ENCOUNTER — Ambulatory Visit: Attending: Internal Medicine | Admitting: Internal Medicine

## 2023-09-14 ENCOUNTER — Encounter: Payer: Self-pay | Admitting: Internal Medicine

## 2023-09-14 VITALS — BP 150/72 | HR 85 | Ht 61.5 in | Wt 110.5 lb

## 2023-09-14 DIAGNOSIS — I5022 Chronic systolic (congestive) heart failure: Secondary | ICD-10-CM | POA: Diagnosis not present

## 2023-09-14 LAB — CUP PACEART INCLINIC DEVICE CHECK
Date Time Interrogation Session: 20250912154627
Implantable Lead Connection Status: 753985
Implantable Lead Connection Status: 753985
Implantable Lead Connection Status: 753985
Implantable Lead Implant Date: 20190124
Implantable Lead Implant Date: 20190124
Implantable Lead Implant Date: 20190124
Implantable Lead Location: 753859
Implantable Lead Location: 753860
Implantable Lead Location: 753860
Implantable Lead Model: 3830
Implantable Lead Model: 5076
Implantable Lead Model: 6935
Implantable Pulse Generator Implant Date: 20190124

## 2023-09-14 NOTE — Patient Instructions (Addendum)
 Medication Instructions:  Your physician recommends that you continue on your current medications as directed. Please refer to the Current Medication list given to you today.  *If you need a refill on your cardiac medications before your next appointment, please call your pharmacy*  Lab Work: None ordered.  You may go to any [facility] Location for your lab work:  [facility] - [address] ([facility]) - [address] [phone] [address]  [place] - [address]   [place]  - [address]   [place] - [address] - [address] [employer]  - [address] [place] [address] ([facility]   If you have labs (blood work) drawn today and your tests are completely normal, you will receive your results only by: [employer] (if you have MyChart)  If you have any lab test that is abnormal or we need to change your treatment, we will call you or send a MyChart message to review the results.  Testing/Procedures: None ordered.  Follow-Up: At [facility], you and your health needs are our priority.  As part of our continuing mission to provide you with exceptional heart care, we have created designated Provider Care Teams.  These Care Teams include your primary Cardiologist (physician) and Advanced Practice Providers (APPs -  Physician Assistants and Nurse Practitioners) who all work together to provide you with the care you need, when you need it.  Your next appointment:   December 2025   The format for your next appointment:   In Person  Provider:   Danelle Birmingham, MD  Note: Remote monitoring is used to monitor your Pacemaker/ ICD from home. This monitoring reduces the number of office visits required to check your device to one time per year. It allows us  to keep an eye on the functioning of your device to ensure it is working properly.

## 2023-09-14 NOTE — Progress Notes (Signed)
 HPI Lindsay Camacho returns today for followup. She is a pleasant 58 yo woman with an ICM, chronic systolic heart failure, HTN, and peripheral vascular disease. The patient has had some HA and her bp has been elevated. She denies chest pain or sob. She underwent 3 vessel CABG about a month ago. She has been slow to improve and still feels weak and sob. She is sore in her chest. Allergies  Allergen Reactions   Aldomet [Methyldopa] Other (See Comments)    Severe hypotension   Brilinta  [Ticagrelor ] Shortness Of Breath   Other Other (See Comments)    Nuclear Stress Test Medication caused seizures   Coumadin  [Warfarin Sodium ] Swelling    Arm swelling   Desyrel [Trazodone] Other (See Comments)    Could not wake up from medication, comatosed   Eliquis [Apixaban] Other (See Comments)    BP got too low   Entresto  [Sacubitril -Valsartan ] Hypertension    Hypotension    Zestril  [Lisinopril ]     Significant blood pressure fluctuations    Penicillins Rash     Current Outpatient Medications  Medication Sig Dispense Refill   acetaminophen  (TYLENOL ) 325 MG tablet Take 2 tablets (650 mg total) by mouth every 6 (six) hours as needed.     amiodarone  (PACERONE ) 200 MG tablet Take 1 tablet (200 mg total) by mouth 2 (two) times daily. 60 tablet 0   aspirin  EC 81 MG tablet Take 81 mg by mouth daily.     Bempedoic Acid -Ezetimibe  (NEXLIZET ) 180-10 MG TABS Take 1 tablet by mouth daily. 90 tablet 3   budesonide -glycopyrrolate -formoterol  (BREZTRI ) 160-9-4.8 MCG/ACT AERO inhaler Inhale 2 puffs into the lungs 2 (two) times daily. 1 each 1   clopidogrel  (PLAVIX ) 75 MG tablet TAKE 1 TABLET BY MOUTH DAILY 90 tablet 3   empagliflozin  (JARDIANCE ) 10 MG TABS tablet Take 1 tablet (10 mg total) by mouth daily. 30 tablet 1   Evolocumab  (REPATHA  SURECLICK) 140 MG/ML SOAJ Inject 140 mg into the skin every 14 (fourteen) days. 6 mL 3   Fe Fum-Vit C-Vit B12-FA (TRIGELS-F FORTE) CAPS capsule Take 1 capsule by mouth daily  after breakfast. 30 capsule 1   magnesium  gluconate (MAGONATE) 500 (27 Mg) MG TABS tablet Take 1 tablet (500 mg total) by mouth daily. 7 tablet 0   Menthol-Methyl Salicylate (SALONPAS PAIN RELIEF PATCH EX) Apply 1 patch topically daily as needed (pain).     rosuvastatin  (CRESTOR ) 40 MG tablet Take 1 tablet (40 mg total) by mouth daily. 30 tablet 1   spironolactone  (ALDACTONE ) 25 MG tablet Take 0.5 tablets (12.5 mg total) by mouth daily. 45 tablet 3   VENTOLIN  HFA 108 (90 Base) MCG/ACT inhaler Inhale 1-2 puffs into the lungs every 4 (four) hours as needed for shortness of breath. 1 each 0   No current facility-administered medications for this visit.     Past Medical History:  Diagnosis Date   Acute myocardial infarction of other lateral wall, initial episode of care 01/2013   DES CFX   Aortoiliac occlusive disease (HCC) 08/22/2016   CAD (coronary artery disease) 11/13/2011   50% ? proximal LCx stenosis per Cath at [facility]   Chest pain, atypical, muscular skelatal   01/09/2014   COVID 02/08/2020   Dyspnea    Heart failure (HCC)    HLD (hyperlipidemia)    Hypertension    Intermediate coronary syndrome (HCC)    LBBB (left bundle branch block)    Mitral regurgitation    Nonischemic cardiomyopathy (HCC) 01/26/2017  Presence of combination internal cardiac defibrillator (ICD) and pacemaker    PVD (peripheral vascular disease) (HCC)    Right CFA stenosis per report on 11/14/2011   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/25/2016   Edwards McArthy-Adams IMR ETLogix ring annuloplasty (Model 4100, Serial [id number], size 26) placed via right mini thoracotomy approach   Stenosis of left subclavian artery (HCC) 08/22/2016   Stroke (HCC)    tia with no deficits    ROS:   All systems reviewed and negative except as noted in the HPI.   Past Surgical History:  Procedure Laterality Date   AORTIC ARCH ANGIOGRAPHY N/A 09/21/2021   Procedure: AORTIC ARCH ANGIOGRAPHY;  Surgeon: Darron Deatrice LABOR, MD;  Location: [facility] INVASIVE CV LAB;  Service: Cardiovascular;  Laterality: N/A;   APPENDECTOMY     BIV ICD INSERTION CRT-D N/A 01/24/2017   Procedure: BIV ICD INSERTION CRT-D;  Surgeon: Waddell Lindsay ORN, MD;  Location: [facility] INVASIVE CV LAB;  Service: Cardiovascular;  Laterality: N/A;   CARDIAC CATHETERIZATION  04/14/2013   Non-obstructive disease, patent CFX stent   CARDIAC CATHETERIZATION N/A 09/21/2014   Procedure: Left Heart Cath and Coronary Angiography;  Surgeon: Debby LABOR Sor, MD;  Location: [facility] INVASIVE CV LAB;  Service: Cardiovascular;  Laterality: N/A;   CORONARY ANGIOPLASTY WITH STENT PLACEMENT  01/17/2013   mild disease except 99% CFX, rx with  2.5 x 28 Alpine drug-eluting stent    CORONARY ARTERY BYPASS GRAFT N/A 08/16/2023   Procedure: CORONARY ARTERY BYPASS GRAFTING (CABG) TIMES THREE USING LEFT INTERNAL MAMMARY ARTERY AND ENDOSCOPICALLY HARVESTED RIGHT GREATER SAPHENOUS VEIN;  Surgeon: Kerrin Elspeth BROCKS, MD;  Location: [facility] OR;  Service: Open Heart Surgery;  Laterality: N/A;   GROIN DEBRIDEMENT Right 04/14/2013   Procedure: Emergency Evacuation of Retroperitoneal Hematoma and Repair Right External Iliac Artery Pseudoaneurysm    ;  Surgeon: Carlin FORBES Haddock, MD;  Location: [facility] OR;  Service: Vascular;  Laterality: Right;   LEFT HEART CATHETERIZATION WITH CORONARY ANGIOGRAM N/A 01/18/2013   Procedure: LEFT HEART CATHETERIZATION WITH CORONARY ANGIOGRAM;  Surgeon: Candyce GORMAN Reek, MD;  Location[facility] CATH LAB;  Service: Cardiovascular;  Laterality: N/A;   LEFT HEART CATHETERIZATION WITH CORONARY ANGIOGRAM N/A 04/14/2013   Procedure: LEFT HEART CATHETERIZATION WITH CORONARY ANGIOGRAM;  Surgeon: Candyce GORMAN Reek, MD;  Location: [facility] CATH LAB;  Service: Cardiovascular;  Laterality: N/A;   MITRAL VALVE REPAIR Right 08/25/2016   Procedure: MINIMALLY INVASIVE MITRAL VALVE REPAIR;  Surgeon: Dusty Sudie DEL, MD;  Location: [facility] OR;  Service: Open Heart Surgery;  Laterality: Right;   MULTIPLE  EXTRACTIONS WITH ALVEOLOPLASTY N/A 08/23/2016   Procedure: Extraction of tooth #'s 17, 20-23, 26-29 with alveoloplasty;  Surgeon: Cyndee Tanda FALCON, DDS;  Location: [facility] OR;  Service: Oral Surgery;  Laterality: N/A;   PERCUTANEOUS CORONARY STENT INTERVENTION (PCI-S)  01/18/2013   Procedure: PERCUTANEOUS CORONARY STENT INTERVENTION (PCI-S);  Surgeon: Candyce GORMAN Reek, MD;  Location: [facility] CATH LAB;  Service: Cardiovascular;;   RIGHT/LEFT HEART CATH AND CORONARY ANGIOGRAPHY N/A 08/21/2016   Procedure: RIGHT/LEFT HEART CATH AND CORONARY ANGIOGRAPHY;  Surgeon: Verlin Lonni BIRCH, MD;  Location: [facility] INVASIVE CV LAB;  Service: Cardiovascular;  Laterality: N/A;   TEE WITHOUT CARDIOVERSION N/A 03/06/2014   Procedure: TRANSESOPHAGEAL ECHOCARDIOGRAM (TEE);  Surgeon: Leim DEL Moose, MD;  Location: [facility] ENDOSCOPY;  Service: Cardiovascular;  Laterality: N/A;   TEE WITHOUT CARDIOVERSION N/A 08/18/2016   Procedure: TRANSESOPHAGEAL ECHOCARDIOGRAM (TEE);  Surgeon: Pietro Redell GORMAN, MD;  Location: [facility] ENDOSCOPY;  Service: Cardiovascular;  Laterality: N/A;   TEE WITHOUT CARDIOVERSION N/A 08/25/2016   Procedure: TRANSESOPHAGEAL ECHOCARDIOGRAM (TEE);  Surgeon: Dusty Sudie DEL, MD;  Location: [facility] OR;  Service: Open Heart Surgery;  Laterality: N/A;   TEE WITHOUT CARDIOVERSION N/A 08/16/2023   Procedure: ECHOCARDIOGRAM, TRANSESOPHAGEAL;  Surgeon: Kerrin Elspeth BROCKS, MD;  Location: [facility] OR;  Service: Open Heart Surgery;  Laterality: N/A;   TUBAL LIGATION     UPPER EXTREMITY INTERVENTION  09/21/2021   Procedure: UPPER EXTREMITY INTERVENTION;  Surgeon: Darron Deatrice LABOR, MD;  Location: [facility] INVASIVE CV LAB;  Service: Cardiovascular;;     Family History  Problem Relation Age of Onset   CAD Mother 58   Lung cancer Mother    Bladder Cancer Mother    Stroke Mother    Heart disease Mother        Before age 58   Hypertension Mother    Heart attack Mother    CAD Father 44   Heart disease Father        Before age 27   Heart  attack Father    Stroke Father        Bleeding stroke     Social History   Socioeconomic History   Marital status: Single    Spouse name: Not on file   Number of children: Not on file   Years of education: Not on file   Highest education level: Not on file  Occupational History   Not on file  Tobacco Use   Smoking status: Every Day    Current packs/day: 0.50    Types: Cigarettes   Smokeless tobacco: Never  Vaping Use   Vaping status: Never Used  Substance and Sexual Activity   Alcohol  use: No    Alcohol /week: 0.0 standard drinks of alcohol    Drug use: Not Currently    Types: Marijuana   Sexual activity: Yes    Birth control/protection: None  Other Topics Concern   Not on file  Social History Narrative   Not on file   Social Drivers of Health   Financial Resource Strain: Not on file  Food Insecurity: No Food Insecurity (08/10/2023)   Hunger Vital Sign    Worried About Running Out of Food in the Last Year: Never true    Ran Out of Food in the Last Year: Never true  Transportation Needs: No Transportation Needs (08/10/2023)   PRAPARE - Administrator, Civil Service (Medical): No    Lack of Transportation (Non-Medical): No  Physical Activity: Not on file  Stress: Not on file  Social Connections: Unknown (08/10/2023)   Social Connection and Isolation Panel    Frequency of Communication with Friends and Family: Three times a week    Frequency of Social Gatherings with Friends and Family: Twice a week    Attends Religious Services: Not on file    Active Member of Clubs or Organizations: Not on file    Attends Banker Meetings: Not on file    Marital Status: Not on file  Intimate Partner Violence: Not At Risk (08/10/2023)   Humiliation, Afraid, Rape, and Kick questionnaire    Fear of Current or Ex-Partner: No    Emotionally Abused: No    Physically Abused: No    Sexually Abused: No     BP (!) 150/72   Pulse 85   Ht 5' 1.5 (1.562 m)   Wt 110  lb 8 oz (50.1 kg)   SpO2 99%   BMI 20.54 kg/m  Physical Exam:  Well appearing NAD HEENT: Unremarkable Neck:  No JVD, no thyromegally Lymphatics:  No adenopathy Back:  No CVA tenderness Lungs:  Clear with no wheezes HEART:  Regular rate rhythm, no murmurs, no rubs, no clicks Abd:  soft, positive bowel sounds, no organomegally, no rebound, no guarding Ext:  2 plus pulses, no edema, no cyanosis, no clubbing Skin:  No rashes no nodules Neuro:  CN II through XII intact, motor grossly intact   DEVICE  Normal device function.  See PaceArt for details.   Assess/Plan:  Chronic systolic heart failure - her symptoms are class 2. She will continue her current meds.  HTN - her bp is elevated. It is back down on my recheck to 140/78 in both arms.  ICD - her Medtronic biv ICD is working normally. We will recheck in several months. Peripheral vascular disease - no difference in bp in each arm. She is s/p PCI of the subclavian with no evidence of restenosis.   Lindsay Mirko Tailor,MD

## 2023-09-17 ENCOUNTER — Other Ambulatory Visit: Payer: Self-pay | Admitting: Thoracic Surgery (Cardiothoracic Vascular Surgery)

## 2023-09-17 ENCOUNTER — Telehealth: Payer: Self-pay | Admitting: Cardiology

## 2023-09-17 DIAGNOSIS — Z951 Presence of aortocoronary bypass graft: Secondary | ICD-10-CM

## 2023-09-17 NOTE — Progress Notes (Unsigned)
 [address] [place] [address]             [phone]       HPI:  Ms. Lindsay Camacho is a 58 year old female with medical history of hypertension, NSTEMI, congestive heart failure, CAD, LBBB, s/p Medtronic CRT-D, acute stroke, tobacco abuse, and hyperlipidemia who returns for routine postoperative follow-up having undergone coronary artery bypass grafting x3, Left internal mammary artery to LAD, Saphenous vein graft to obtuse marginal 1,Saphenous vein graft to posterior descending, and endoscopic vein harvest right leg on 08/16/2023 with Dr. Kerrin.  The patient's early postoperative recovery while in the hospital was notable for having postoperative atrial fibrillation. She was initially started on amiodarone  but this was discontinued.  GDMT was initiated and advanced heart failure team assisted with management.  She had thrombocytopenia and received transfusion of platelets and platelet level returned to normal range prior to discharge. She made slow but steady progress with physical rehab. She was discharged home in stable condition on 08/23/2023.   Since hospital discharge the patient reports that she has been doing well.  She was seen in the ER for hypotension and her spironolactone  and jardiance  were discontinued.  She now reports her home blood pressure readings at 130s-140s/80s. She has been using oxycodone  as needed for pain.  She finds that her chest incision site feels numb.  She has been walking 10 minutes everyday.  She still experiences some shortness of breath but using the inhalers prescribed helps.  She continues to be smoke free since August 2025 and plans on continued smoking cessation at this time.     Allergies as of 09/18/2023       Reactions   Aldomet [methyldopa] Other (See Comments)   Severe hypotension   Brilinta  [ticagrelor ] Shortness Of Breath   Other Other (See Comments)   Nuclear Stress Test Medication caused seizures   Coumadin   [warfarin Sodium ] Swelling   Arm swelling   Desyrel [trazodone] Other (See Comments)   Could not wake up from medication, comatosed   Eliquis [apixaban] Other (See Comments)   BP got too low   Entresto  [sacubitril -valsartan ] Hypertension   Hypotension    Zestril  [lisinopril ]    Significant blood pressure fluctuations    Penicillins Rash        Medication List        Accurate as of September 18, 2023 12:46 PM. If you have any questions, ask your nurse or doctor.          acetaminophen  325 MG tablet Commonly known as: TYLENOL  Take 2 tablets (650 mg total) by mouth every 6 (six) hours as needed.   amiodarone  200 MG tablet Commonly known as: PACERONE  Take 1 tablet (200 mg total) by mouth 2 (two) times daily.   aspirin  EC 81 MG tablet Take 81 mg by mouth daily.   budesonide -glycopyrrolate -formoterol  160-9-4.8 MCG/ACT Aero inhaler Commonly known as: BREZTRI  Inhale 2 puffs into the lungs 2 (two) times daily.   clopidogrel  75 MG tablet Commonly known as: PLAVIX  TAKE 1 TABLET BY MOUTH DAILY   empagliflozin  10 MG Tabs tablet Commonly known as: JARDIANCE  Take 1 tablet (10 mg total) by mouth daily.   Fe Fum-Vit C-Vit B12-FA Caps capsule Commonly known as: TRIGELS-F FORTE Take 1 capsule by mouth daily after breakfast.   magnesium  gluconate 500 (27 Mg) MG Tabs tablet Commonly known as: MAGONATE Take 1 tablet (500 mg total) by mouth daily.  Nexlizet  180-10 MG Tabs Generic drug: Bempedoic Acid -Ezetimibe  Take 1 tablet by mouth daily.   Repatha  SureClick 140 MG/ML Soaj Generic drug: Evolocumab  Inject 140 mg into the skin every 14 (fourteen) days.   rosuvastatin  40 MG tablet Commonly known as: CRESTOR  Take 1 tablet (40 mg total) by mouth daily.   SALONPAS PAIN RELIEF PATCH EX Apply 1 patch topically daily as needed (pain).   spironolactone  25 MG tablet Commonly known as: ALDACTONE  Take 0.5 tablets (12.5 mg total) by mouth daily.   Ventolin  HFA 108 (90 Base)  MCG/ACT inhaler Generic drug: albuterol  Inhale 1-2 puffs into the lungs every 4 (four) hours as needed for shortness of breath.         ROS  Review of Systems  Constitutional:  Positive for malaise/fatigue. Negative for weight loss.  Respiratory:  Negative for cough, shortness of breath and wheezing.   Cardiovascular:  Negative for chest pain, palpitations and leg swelling.      BP (!) 141/82 (BP Location: Right Arm, Patient Position: Sitting, Cuff Size: Normal)   Pulse 93   Resp 20   Ht 5' 1 (1.549 m)   Wt 106 lb 9.6 oz (48.4 kg)   SpO2 98% Comment: RA  BMI 20.14 kg/m   Physical Exam Constitutional:      Appearance: Normal appearance.  HENT:     Head: Normocephalic and atraumatic.  Cardiovascular:     Rate and Rhythm: Normal rate and regular rhythm.     Heart sounds: Normal heart sounds, S1 normal and S2 normal.  Pulmonary:     Effort: Pulmonary effort is normal.     Breath sounds: Normal breath sounds.  Musculoskeletal:     Cervical back: Normal range of motion.  Skin:    General: Skin is warm and dry.      Neurological:     General: No focal deficit present.     Mental Status: She is alert.       Imaging: CLINICAL DATA:  Status post CABG.   EXAM: CHEST - 2 VIEW   COMPARISON:  08/20/2023   FINDINGS: Cardiopericardial silhouette is at upper limits of normal for size. Lungs are hyperexpanded. Linear atelectasis or scarring in the left base again noted. There is a small left pleural effusion. No acute bony abnormality. Left-sided permanent pacemaker/AICD.   IMPRESSION: Small left pleural effusion with left base atelectasis or scarring.     Electronically Signed   By: Camellia Candle M.D.   On: 09/18/2023 11:35   Assessment/Plan: S/P CABG x 3   -We reviewed today's chest x ray. We discussed driving and since she is still using narcotics for pain she should wait to start driving.  Once she is no longer using oxycodone  she may drive.  First time  driving should be a short distance in the day time.   -For incision pain use tylenol  and only use oxycodone  as needed. Incision have healed appropriately  -Discussed participation in cardiac rehab and she is cleared from a surgical standpoint -Discussed need for continuance of sternal precuations until a full 6 weeks  -She has been seen by cardiology.  She is continue aspirin  81 mg daily, Plavix  75 mg daily, Zetia  10 mg daily and Crestor  40 mg daily. She was seen in the ER for hypotension and spironolactone  and jardiance  were discontinued.  She was started on amiodarone  200 mg twice daily for 4 weeks for atrial fibrillation.   -She should continue to check her blood pressure daily and report  readings to cardiology for medication management -Discussed continuance of smoking cessation -Follow up with [facility] as needed  Lindsay CHRISTELLA Rough, PA-C 12:46 PM 09/18/23

## 2023-09-17 NOTE — Telephone Encounter (Signed)
 Medical Records request form faxed & scanned into pt's chart.   JB, 09-17-23

## 2023-09-18 ENCOUNTER — Other Ambulatory Visit: Payer: Self-pay | Admitting: Surgical

## 2023-09-18 ENCOUNTER — Ambulatory Visit

## 2023-09-18 ENCOUNTER — Ambulatory Visit (HOSPITAL_COMMUNITY)
Admission: RE | Admit: 2023-09-18 | Discharge: 2023-09-18 | Disposition: A | Discharge status: Home / Self Care | Source: Ambulatory Visit | Attending: Cardiovascular Disease | Admitting: Cardiovascular Disease

## 2023-09-18 VITALS — BP 141/82 | HR 93 | Resp 20 | Ht 61.0 in | Wt 106.6 lb

## 2023-09-18 DIAGNOSIS — Z951 Presence of aortocoronary bypass graft: Secondary | ICD-10-CM | POA: Insufficient documentation

## 2023-09-18 NOTE — Patient Instructions (Signed)
 You may continue to gradually increase your physical activity as tolerated.  Refrain from any heavy lifting or strenuous use of your arms and shoulders until at least 6 weeks from the time of your surgery, and avoid activities that cause increased pain in your chest on the side of your surgical incision.  Otherwise you may continue to increase activities without any particular limitations.  Increase the intensity and duration of physical activity gradually.   You may return to driving an automobile as long as you are no longer requiring oral narcotic pain relievers during the daytime.  It would be wise to start driving only short distances during the daylight and gradually increase from there as you feel comfortable.

## 2023-09-26 ENCOUNTER — Telehealth: Payer: Self-pay | Admitting: Cardiology

## 2023-09-26 NOTE — Telephone Encounter (Signed)
 Pt has been breaking out in cold sweats 3/5 times a day but no other symptoms.

## 2023-09-26 NOTE — Telephone Encounter (Signed)
 Spoke with pt who reports she starting having cold chills last night and they have been continuing all day today.  She denies any fever, cough or cold s/s.  No signs of infection she is aware of.  Reports this occurred before past her heart surgery but know one could tell her why.  She denies any chest, jaw, arm, neck, shoulder blade pain and no shortness of breath.  Advised to contact her PCP for evaluation.  She states understanding.

## 2023-10-08 ENCOUNTER — Other Ambulatory Visit: Payer: Self-pay

## 2023-10-09 ENCOUNTER — Ambulatory Visit: Attending: Cardiology | Admitting: Cardiology

## 2023-10-09 ENCOUNTER — Encounter: Payer: Self-pay | Admitting: Cardiology

## 2023-10-09 VITALS — BP 111/73 | HR 92 | Ht 61.0 in | Wt 109.0 lb

## 2023-10-09 DIAGNOSIS — Z95810 Presence of automatic (implantable) cardiac defibrillator: Secondary | ICD-10-CM

## 2023-10-09 DIAGNOSIS — E785 Hyperlipidemia, unspecified: Secondary | ICD-10-CM | POA: Diagnosis not present

## 2023-10-09 DIAGNOSIS — I447 Left bundle-branch block, unspecified: Secondary | ICD-10-CM

## 2023-10-09 DIAGNOSIS — I2510 Atherosclerotic heart disease of native coronary artery without angina pectoris: Secondary | ICD-10-CM | POA: Diagnosis not present

## 2023-10-09 DIAGNOSIS — I480 Paroxysmal atrial fibrillation: Secondary | ICD-10-CM

## 2023-10-09 DIAGNOSIS — I5022 Chronic systolic (congestive) heart failure: Secondary | ICD-10-CM | POA: Diagnosis not present

## 2023-10-09 DIAGNOSIS — Z9889 Other specified postprocedural states: Secondary | ICD-10-CM

## 2023-10-09 MED ORDER — AMIODARONE HCL 200 MG PO TABS: 200.0000 mg | ORAL_TABLET | Freq: Two times a day (BID) | ORAL | 3 refills | Status: DC

## 2023-10-09 NOTE — Progress Notes (Signed)
 Cardiology Office Note:  .   Date:  10/09/2023  ID:  Lindsay Camacho, DOB March 21, 1965, MRN [id number] PCP: Norrine Sharper, MD  [facility] Providers Cardiologist:  Oneil Parchment, MD Electrophysiologist:  Danelle Birmingham, MD  [facility] Cardiologist:  Deatrice Cage, MD     History of Present Illness: .   Lindsay Camacho is a 58 y.o. female Discussed the use of AI scribe software  History of Present Illness Lindsay Camacho is a 58 year old female with ischemic cardiomyopathy and chronic systolic heart failure who presents for follow-up after three-vessel CABG.  She underwent a three-vessel coronary artery bypass graft (CABG) on August 16, 2023, and has a history of minimally invasive mitral valve repair in 2018. She feels better than she has been, although not completely back to normal. She is currently on amiodarone  200 mg once daily, which was adjusted from twice daily. No current palpitations or racing heart, although she experienced these symptoms initially post-surgery, which were managed with medication.  She has a history of hypertension and peripheral vascular disease, for which she had a percutaneous coronary intervention (PCI) to her subclavian artery with no evidence of restenosis. Blood pressures have been equal on both arms. She is on carvedilol  40 mg and aspirin  81 mg daily.  Her medication regimen for hyperlipidemia includes bempedoic acid  and ezetimibe  combination (180/10 mg), Repatha , and Crestor  40 mg. Her LDL was 162 mg/dL as of August 11, 2023, and she is working with the pharmacy team to manage her cholesterol levels.  Her hemoglobin A1c is 5.1%, and her creatinine is 0.74 mg/dL, indicating stable kidney function. Her hemoglobin was noted to be low at 8.5 g/dL on August 22, 2023, following her surgery.     Studies Reviewed: .        Results LABS LDL: 162 (08/11/2023) HbA1c: 5.1 Hemoglobin: 8.5 (08/22/2023) Creatinine: 0.74 Risk Assessment/Calculations:             Physical Exam:   VS:  BP 111/73   Pulse 92   Ht 5' 1 (1.549 m)   Wt 109 lb (49.4 kg)   SpO2 94%   BMI 20.60 kg/m    Wt Readings from Last 3 Encounters:  10/09/23 109 lb (49.4 kg)  09/18/23 106 lb 9.6 oz (48.4 kg)  09/14/23 110 lb 8 oz (50.1 kg)    GEN: Well nourished, well developed in no acute distress NECK: No JVD; No carotid bruits CARDIAC: CABG scar healing well RRR, no murmurs, no rubs, no gallops, 1 isolated ectopic beat RESPIRATORY:  Clear to auscultation without rales, wheezing or rhonchi  ABDOMEN: Soft, non-tender, non-distended EXTREMITIES:  No edema; No deformity   ASSESSMENT AND PLAN: .    Assessment and Plan Assessment & Plan Ischemic cardiomyopathy with chronic systolic heart failure Recovering from recent cardiac surgery. Currently on amiodarone  200 mg once daily to manage arrhythmias. Reports no current palpitations or racing heart, and heart rate is stable. - Continue amiodarone  200 mg once daily - Re-evaluate amiodarone  use in December with Dr. Birmingham - Ejection fraction 60 to 65% on 08/20/2023, D-shaped septum  Coronary artery disease, post-CABG and PCI Underwent three-vessel CABG on August 16, 2023, and has a history of PCI to the subclavian artery with no evidence of restenosis. Currently on aspirin  and clopidogrel  for antiplatelet therapy. Blood pressure and heart rate are stable. - Continue aspirin  81 mg daily - Continue clopidogrel  75 mg daily  Peripheral vascular disease, status post subclavian stent Subclavian artery stenting with  no current evidence of restenosis. Blood pressures are equal in both arms, indicating good vascular status.  Mitral valve repair, status post minimally invasive surgery Minimally invasive mitral valve repair in 2018. Surgical site is well-healed with no complications reported.  Hypertension Blood pressure is well-controlled on current medication regimen, including carvedilol . - Continue carvedilol  40 mg  daily  Hyperlipidemia On a combination of bempedoic acid  and ezetimibe , as well as Repatha  and Crestor , to manage hyperlipidemia. LDL was 162 mg/dL as of August 11, 2023, and due for a recheck this month. - Continue bempedoic acid /ezetimibe  180/10 mg daily - Continue Repatha  as prescribed - Continue Crestor  40 mg daily - Recheck LDL levels this month         Dispo: 6 months APP  Signed, Oneil Parchment, MD

## 2023-10-09 NOTE — Patient Instructions (Signed)
 Medication Instructions:  The current medical regimen is effective;  continue present plan and medications.  *If you need a refill on your cardiac medications before your next appointment, please call your pharmacy*  Follow-Up: At [facility], you and your health needs are our priority.  As part of our continuing mission to provide you with exceptional heart care, our providers are all part of one team.  This team includes your primary Cardiologist (physician) and Advanced Practice Providers or APPs (Physician Assistants and Nurse Practitioners) who all work together to provide you with the care you need, when you need it.  Your next appointment:   6 month(s)  Provider:   One of our Advanced Practice Providers (APPs): Morse Clause, PA-C  Lamarr Satterfield, NP Miriam Shams, NP  Olivia Pavy, PA-C Josefa Beauvais, NP  Leontine Salen, PA-C Orren Fabry, PA-C  Hao Meng, PA-C Ernest Dick, NP  Damien Braver, NP Jon Hails, PA-C  Waddell Donath, PA-C    Dayna Dunn, PA-C  Scott Weaver, PA-C Lum Louis, NP Katlyn West, NP Callie Goodrich, PA-C  Xika Zhao, NP Sheng Haley, PA-C    Kathleen Johnson, PA-C       We recommend signing up for the patient portal called MyChart.  Sign up information is provided on this After Visit Summary.  MyChart is used to connect with patients for Virtual Visits (Telemedicine).  Patients are able to view lab/test results, encounter notes, upcoming appointments, etc.  Non-urgent messages can be sent to your provider as well.   To learn more about what you can do with MyChart, go to [email or web address].

## 2023-10-13 ENCOUNTER — Other Ambulatory Visit: Payer: Self-pay | Admitting: Surgical

## 2023-10-17 ENCOUNTER — Other Ambulatory Visit: Payer: Self-pay | Admitting: Surgical

## 2023-10-26 ENCOUNTER — Other Ambulatory Visit: Payer: Self-pay | Admitting: Cardiology

## 2023-11-26 ENCOUNTER — Ambulatory Visit (INDEPENDENT_AMBULATORY_CARE_PROVIDER_SITE_OTHER)

## 2023-11-26 DIAGNOSIS — I5022 Chronic systolic (congestive) heart failure: Secondary | ICD-10-CM

## 2023-11-27 LAB — CUP PACEART REMOTE DEVICE CHECK
Battery Remaining Longevity: 5 mo
Battery Voltage: 2.79 V
Brady Statistic AP VP Percent: 99.53 %
Brady Statistic AP VS Percent: 0.45 %
Brady Statistic AS VP Percent: 0.01 %
Brady Statistic AS VS Percent: 0 %
Brady Statistic RA Percent Paced: 99.97 %
Brady Statistic RV Percent Paced: 99.54 %
Date Time Interrogation Session: 20251124012310
HighPow Impedance: 57 Ohm
Implantable Lead Connection Status: 753985
Implantable Lead Connection Status: 753985
Implantable Lead Connection Status: 753985
Implantable Lead Implant Date: 20190124
Implantable Lead Implant Date: 20190124
Implantable Lead Implant Date: 20190124
Implantable Lead Location: 753859
Implantable Lead Location: 753860
Implantable Lead Location: 753860
Implantable Lead Model: 3830
Implantable Lead Model: 5076
Implantable Lead Model: 6935
Implantable Pulse Generator Implant Date: 20190124
Lead Channel Impedance Value: 209 Ohm
Lead Channel Impedance Value: 247 Ohm
Lead Channel Impedance Value: 323 Ohm
Lead Channel Impedance Value: 380 Ohm
Lead Channel Impedance Value: 380 Ohm
Lead Channel Impedance Value: 399 Ohm
Lead Channel Pacing Threshold Amplitude: 0.5 V
Lead Channel Pacing Threshold Amplitude: 1.25 V
Lead Channel Pacing Threshold Amplitude: 1.25 V
Lead Channel Pacing Threshold Pulse Width: 0.4 ms
Lead Channel Pacing Threshold Pulse Width: 0.4 ms
Lead Channel Pacing Threshold Pulse Width: 0.5 ms
Lead Channel Sensing Intrinsic Amplitude: 0.625 mV
Lead Channel Sensing Intrinsic Amplitude: 0.625 mV
Lead Channel Sensing Intrinsic Amplitude: 8.5 mV
Lead Channel Sensing Intrinsic Amplitude: 8.5 mV
Lead Channel Setting Pacing Amplitude: 1.5 V
Lead Channel Setting Pacing Amplitude: 1.75 V
Lead Channel Setting Pacing Amplitude: 2.5 V
Lead Channel Setting Pacing Pulse Width: 0.4 ms
Lead Channel Setting Pacing Pulse Width: 0.5 ms
Lead Channel Setting Sensing Sensitivity: 0.3 mV
Zone Setting Status: 755011

## 2023-11-28 NOTE — Progress Notes (Signed)
 Remote ICD Transmission

## 2023-12-05 ENCOUNTER — Ambulatory Visit: Payer: Self-pay | Admitting: Internal Medicine

## 2023-12-12 ENCOUNTER — Encounter: Payer: Self-pay | Admitting: Internal Medicine

## 2023-12-12 ENCOUNTER — Ambulatory Visit: Attending: Cardiology | Admitting: Internal Medicine

## 2023-12-12 ENCOUNTER — Ambulatory Visit: Admitting: Internal Medicine

## 2023-12-12 VITALS — BP 100/68 | HR 88 | Ht 61.0 in | Wt 113.8 lb

## 2023-12-12 DIAGNOSIS — I5022 Chronic systolic (congestive) heart failure: Secondary | ICD-10-CM | POA: Diagnosis not present

## 2023-12-12 DIAGNOSIS — Z95810 Presence of automatic (implantable) cardiac defibrillator: Secondary | ICD-10-CM

## 2023-12-12 DIAGNOSIS — I255 Ischemic cardiomyopathy: Secondary | ICD-10-CM | POA: Diagnosis not present

## 2023-12-12 LAB — CUP PACEART INCLINIC DEVICE CHECK
Battery Remaining Longevity: 4 mo
Battery Voltage: 2.79 V
Brady Statistic AP VP Percent: 99.58 %
Brady Statistic AP VS Percent: 0.41 %
Brady Statistic AS VP Percent: 0.01 %
Brady Statistic AS VS Percent: 0 %
Brady Statistic RA Percent Paced: 99.98 %
Brady Statistic RV Percent Paced: 99.58 %
Date Time Interrogation Session: 20251210104459
HighPow Impedance: 63 Ohm
Implantable Lead Connection Status: 753985
Implantable Lead Connection Status: 753985
Implantable Lead Connection Status: 753985
Implantable Lead Implant Date: 20190124
Implantable Lead Implant Date: 20190124
Implantable Lead Implant Date: 20190124
Implantable Lead Location: 753859
Implantable Lead Location: 753860
Implantable Lead Location: 753860
Implantable Lead Model: 3830
Implantable Lead Model: 5076
Implantable Lead Model: 6935
Implantable Pulse Generator Implant Date: 20190124
Lead Channel Impedance Value: 247 Ohm
Lead Channel Impedance Value: 266 Ohm
Lead Channel Impedance Value: 323 Ohm
Lead Channel Impedance Value: 399 Ohm
Lead Channel Impedance Value: 437 Ohm
Lead Channel Impedance Value: 437 Ohm
Lead Channel Pacing Threshold Amplitude: 0.5 V
Lead Channel Pacing Threshold Amplitude: 1 V
Lead Channel Pacing Threshold Amplitude: 1 V
Lead Channel Pacing Threshold Pulse Width: 0.4 ms
Lead Channel Pacing Threshold Pulse Width: 0.4 ms
Lead Channel Pacing Threshold Pulse Width: 0.5 ms
Lead Channel Sensing Intrinsic Amplitude: 1.375 mV
Lead Channel Sensing Intrinsic Amplitude: 9 mV
Lead Channel Setting Pacing Amplitude: 1.5 V
Lead Channel Setting Pacing Amplitude: 2 V
Lead Channel Setting Pacing Amplitude: 2.5 V
Lead Channel Setting Pacing Pulse Width: 0.4 ms
Lead Channel Setting Pacing Pulse Width: 0.5 ms
Lead Channel Setting Sensing Sensitivity: 0.3 mV
Zone Setting Status: 755011

## 2023-12-12 NOTE — Patient Instructions (Addendum)
 Medication Instructions:  Your physician recommends that you continue on your current medications as directed. Please refer to the Current Medication list given to you today.  *If you need a refill on your cardiac medications before your next appointment, please call your pharmacy*  Lab Work: None ordered.  You may go to any [facility] Location for your lab work:  [facility] - [address] ([facility]) - [address] [phone] [address]  [place] - [address]   [place]  - [address]   [place] - [address] - [address] [place]  - [address] [place] - [address] ([facility]   If you have labs (blood work) drawn today and your tests are completely normal, you will receive your results only by: [employer] (if you have MyChart)  If you have any lab test that is abnormal or we need to change your treatment, we will call you or send a MyChart message to review the results.  Testing/Procedures: None ordered.  Follow-Up: At [facility], you and your health needs are our priority.  As part of our continuing mission to provide you with exceptional heart care, we have created designated Provider Care Teams.  These Care Teams include your primary Cardiologist (physician) and Advanced Practice Providers (APPs -  Physician Assistants and Nurse Practitioners) who all work together to provide you with the care you need, when you need it.  Your next appointment:   3 1/2 month in [place]  The format for your next appointment:   In Person  Provider:   Donnice Primus, MD   Note: Remote monitoring is used to monitor your Pacemaker/ ICD from home. This monitoring reduces the number of office visits required to check your device to one time per year. It allows us  to keep an eye on the functioning of your device to ensure it is working properly.

## 2023-12-12 NOTE — Progress Notes (Signed)
 HPI Lindsay Camacho returns today for followup. She is a pleasant 58 yo woman with an ICM, chronic systolic heart failure, HTN, and peripheral vascular disease. The patient has slowly improved over the past 3 months. She denies chest pain or sob. She underwent 3 vessel CABG about 4 months ago. She has been slow to improve and still feels weak and sob. She is sore in her chest.  Allergies  Allergen Reactions   Aldomet [Methyldopa] Other (See Comments)    Severe hypotension   Brilinta  [Ticagrelor ] Shortness Of Breath   Other Other (See Comments)    Nuclear Stress Test Medication caused seizures   Coumadin  [Warfarin Sodium ] Swelling    Arm swelling   Desyrel [Trazodone] Other (See Comments)    Could not wake up from medication, comatosed   Eliquis [Apixaban] Other (See Comments)    BP got too low   Entresto  [Sacubitril -Valsartan ] Hypertension    Hypotension    Zestril  [Lisinopril ]     Significant blood pressure fluctuations    Penicillins Rash     Current Outpatient Medications  Medication Sig Dispense Refill   acetaminophen  (TYLENOL ) 325 MG tablet Take 2 tablets (650 mg total) by mouth every 6 (six) hours as needed.     amiodarone  (PACERONE ) 200 MG tablet Take 200 mg by mouth daily.     aspirin  EC 81 MG tablet Take 81 mg by mouth daily.     Bempedoic Acid -Ezetimibe  (NEXLIZET ) 180-10 MG TABS Take 1 tablet by mouth daily. 90 tablet 3   budesonide -glycopyrrolate -formoterol  (BREZTRI ) 160-9-4.8 MCG/ACT AERO inhaler Inhale 2 puffs into the lungs 2 (two) times daily. 1 each 1   carvedilol  (COREG  CR) 40 MG 24 hr capsule Take 40 mg by mouth daily.     clopidogrel  (PLAVIX ) 75 MG tablet TAKE 1 TABLET BY MOUTH DAILY 90 tablet 3   Evolocumab  (REPATHA  SURECLICK) 140 MG/ML SOAJ Inject 140 mg into the skin every 14 (fourteen) days. 6 mL 3   Fe Fum-Vit C-Vit B12-FA (TRIGELS-F FORTE) CAPS capsule Take 1 capsule by mouth daily after breakfast. 30 capsule 1   Menthol-Methyl Salicylate (SALONPAS PAIN  RELIEF PATCH EX) Apply 1 patch topically daily as needed (pain).     rosuvastatin  (CRESTOR ) 40 MG tablet TAKE 1 TABLET BY MOUTH DAILY 90 tablet 3   VENTOLIN  HFA 108 (90 Base) MCG/ACT inhaler Inhale 1-2 puffs into the lungs every 4 (four) hours as needed for shortness of breath. 1 each 0   No current facility-administered medications for this visit.     Past Medical History:  Diagnosis Date   Acute myocardial infarction of other lateral wall, initial episode of care 01/2013   DES CFX   Aortoiliac occlusive disease (HCC) 08/22/2016   CAD (coronary artery disease) 11/13/2011   50% ? proximal LCx stenosis per Cath at [facility]   Chest pain, atypical, muscular skelatal   01/09/2014   COVID 02/08/2020   Dyspnea    Heart failure (HCC)    HLD (hyperlipidemia)    Hypertension    Intermediate coronary syndrome (HCC)    LBBB (left bundle branch block)    Mitral regurgitation    Nonischemic cardiomyopathy (HCC) 01/26/2017   Presence of combination internal cardiac defibrillator (ICD) and pacemaker    PVD (peripheral vascular disease)    Right CFA stenosis per report on 11/14/2011   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/25/2016   Edwards McArthy-Adams IMR ETLogix ring annuloplasty (Model 4100, Serial [id number], size 26) placed via right mini  thoracotomy approach   Stenosis of left subclavian artery 08/22/2016   Stroke [facility])    tia with no deficits    ROS:   All systems reviewed and negative except as noted in the HPI.   Past Surgical History:  Procedure Laterality Date   AORTIC ARCH ANGIOGRAPHY N/A 09/21/2021   Procedure: AORTIC ARCH ANGIOGRAPHY;  Surgeon: Darron Deatrice LABOR, MD;  Location: [facility] INVASIVE CV LAB;  Service: Cardiovascular;  Laterality: N/A;   APPENDECTOMY     BIV ICD INSERTION CRT-D N/A 01/24/2017   Procedure: BIV ICD INSERTION CRT-D;  Surgeon: Waddell Danelle ORN, MD;  Location: [facility] INVASIVE CV LAB;  Service: Cardiovascular;  Laterality: N/A;   CARDIAC CATHETERIZATION   04/14/2013   Non-obstructive disease, patent CFX stent   CARDIAC CATHETERIZATION N/A 09/21/2014   Procedure: Left Heart Cath and Coronary Angiography;  Surgeon: Debby LABOR Sor, MD;  Location: [facility] INVASIVE CV LAB;  Service: Cardiovascular;  Laterality: N/A;   CORONARY ANGIOPLASTY WITH STENT PLACEMENT  01/17/2013   mild disease except 99% CFX, rx with  2.5 x 28 Alpine drug-eluting stent    CORONARY ARTERY BYPASS GRAFT N/A 08/16/2023   Procedure: CORONARY ARTERY BYPASS GRAFTING (CABG) TIMES THREE USING LEFT INTERNAL MAMMARY ARTERY AND ENDOSCOPICALLY HARVESTED RIGHT GREATER SAPHENOUS VEIN;  Surgeon: Kerrin Elspeth BROCKS, MD;  Location: [facility] OR;  Service: Open Heart Surgery;  Laterality: N/A;   GROIN DEBRIDEMENT Right 04/14/2013   Procedure: Emergency Evacuation of Retroperitoneal Hematoma and Repair Right External Iliac Artery Pseudoaneurysm    ;  Surgeon: Carlin FORBES Haddock, MD;  Location: [facility] OR;  Service: Vascular;  Laterality: Right;   LEFT HEART CATHETERIZATION WITH CORONARY ANGIOGRAM N/A 01/18/2013   Procedure: LEFT HEART CATHETERIZATION WITH CORONARY ANGIOGRAM;  Surgeon: Candyce GORMAN Reek, MD;  Location: [facility] CATH LAB;  Service: Cardiovascular;  Laterality: N/A;   LEFT HEART CATHETERIZATION WITH CORONARY ANGIOGRAM N/A 04/14/2013   Procedure: LEFT HEART CATHETERIZATION WITH CORONARY ANGIOGRAM;  Surgeon: Candyce GORMAN Reek, MD;  Location: [facility] CATH LAB;  Service: Cardiovascular;  Laterality: N/A;   MITRAL VALVE REPAIR Right 08/25/2016   Procedure: MINIMALLY INVASIVE MITRAL VALVE REPAIR;  Surgeon: Dusty Sudie DEL, MD;  Location: [facility] OR;  Service: Open Heart Surgery;  Laterality: Right;   MULTIPLE EXTRACTIONS WITH ALVEOLOPLASTY N/A 08/23/2016   Procedure: Extraction of tooth #'s 17, 20-23, 26-29 with alveoloplasty;  Surgeon: Cyndee Tanda FALCON, DDS;  Location[facility] OR;  Service: Oral Surgery;  Laterality: N/A;   PERCUTANEOUS CORONARY STENT INTERVENTION (PCI-S)  01/18/2013   Procedure: PERCUTANEOUS CORONARY STENT  INTERVENTION (PCI-S);  Surgeon: Candyce GORMAN Reek, MD;  Location: [facility] CATH LAB;  Service: Cardiovascular;;   RIGHT/LEFT HEART CATH AND CORONARY ANGIOGRAPHY N/A 08/21/2016   Procedure: RIGHT/LEFT HEART CATH AND CORONARY ANGIOGRAPHY;  Surgeon: Verlin Lonni BIRCH, MD;  Location: [facility] INVASIVE CV LAB;  Service: Cardiovascular;  Laterality: N/A;   TEE WITHOUT CARDIOVERSION N/A 03/06/2014   Procedure: TRANSESOPHAGEAL ECHOCARDIOGRAM (TEE);  Surgeon: Leim DEL Moose, MD;  Location: [facility] ENDOSCOPY;  Service: Cardiovascular;  Laterality: N/A;   TEE WITHOUT CARDIOVERSION N/A 08/18/2016   Procedure: TRANSESOPHAGEAL ECHOCARDIOGRAM (TEE);  Surgeon: Pietro Redell GORMAN, MD;  Location: [facility] ENDOSCOPY;  Service: Cardiovascular;  Laterality: N/A;   TEE WITHOUT CARDIOVERSION N/A 08/25/2016   Procedure: TRANSESOPHAGEAL ECHOCARDIOGRAM (TEE);  Surgeon: Dusty Sudie DEL, MD;  Location: [facility] OR;  Service: Open Heart Surgery;  Laterality: N/A;   TEE WITHOUT CARDIOVERSION N/A 08/16/2023   Procedure: ECHOCARDIOGRAM, TRANSESOPHAGEAL;  Surgeon: Kerrin Elspeth BROCKS, MD;  Location: [facility] OR;  Service: Open Heart Surgery;  Laterality: N/A;   TUBAL LIGATION     UPPER EXTREMITY INTERVENTION  09/21/2021   Procedure: UPPER EXTREMITY INTERVENTION;  Surgeon: Darron Deatrice LABOR, MD;  Location: [facility] INVASIVE CV LAB;  Service: Cardiovascular;;     Family History  Problem Relation Age of Onset   CAD Mother 59   Lung cancer Mother    Bladder Cancer Mother    Stroke Mother    Heart disease Mother        Before age 41   Hypertension Mother    Heart attack Mother    CAD Father 13   Heart disease Father        Before age 51   Heart attack Father    Stroke Father        Bleeding stroke     Social History   Socioeconomic History   Marital status: Single    Spouse name: Not on file   Number of children: Not on file   Years of education: Not on file   Highest education level: Not on file  Occupational History   Not on file  Tobacco  Use   Smoking status: Former    Current packs/day: 0.50    Types: Cigarettes   Smokeless tobacco: Never   Tobacco comments:    Stopped 08/16/23  Vaping Use   Vaping status: Never Used  Substance and Sexual Activity   Alcohol  use: No    Alcohol /week: 0.0 standard drinks of alcohol    Drug use: Not Currently    Types: Marijuana   Sexual activity: Yes    Birth control/protection: None  Other Topics Concern   Not on file  Social History Narrative   Not on file   Social Drivers of Health   Financial Resource Strain: Not on file  Food Insecurity: No Food Insecurity (08/10/2023)   Hunger Vital Sign    Worried About Running Out of Food in the Last Year: Never true    Ran Out of Food in the Last Year: Never true  Transportation Needs: No Transportation Needs (08/10/2023)   PRAPARE - Administrator, Civil Service (Medical): No    Lack of Transportation (Non-Medical): No  Physical Activity: Not on file  Stress: Not on file  Social Connections: Unknown (08/10/2023)   Social Connection and Isolation Panel    Frequency of Communication with Friends and Family: Three times a week    Frequency of Social Gatherings with Friends and Family: Twice a week    Attends Religious Services: Not on Marketing Executive or Organizations: Not on file    Attends Banker Meetings: Not on file    Marital Status: Not on file  Intimate Partner Violence: Not At Risk (08/10/2023)   Humiliation, Afraid, Rape, and Kick questionnaire    Fear of Current or Ex-Partner: No    Emotionally Abused: No    Physically Abused: No    Sexually Abused: No     BP 100/68   Pulse 88   Ht 5' 1 (1.549 m)   Wt 113 lb 12.8 oz (51.6 kg)   SpO2 95%   BMI 21.50 kg/m   Physical Exam:  Well appearing NAD HEENT: Unremarkable Neck:  No JVD, no thyromegally Lymphatics:  No adenopathy Back:  No CVA tenderness Lungs:  Clear with no wheezes HEART:  Regular rate rhythm, no murmurs, no rubs, no  clicks Abd:  soft, positive bowel sounds, no organomegally, no  rebound, no guarding Ext:  2 plus pulses, no edema, no cyanosis, no clubbing Skin:  No rashes no nodules Neuro:  CN II through XII intact, motor grossly intact   DEVICE  Normal device function.  See PaceArt for details.   Assess/Plan:  Chronic systolic heart failure - her symptoms are class 2. She will continue her current meds.  HTN - her bp is much better. Continue current meds. ICD - her Medtronic biv ICD is working normally. We will recheck in several months. She is 4 months from ERI. Peripheral vascular disease - no difference in bp in each arm. She is s/p PCI of the subclavian with no evidence of restenosis.   Danelle Cache Decoursey,MD

## 2023-12-17 ENCOUNTER — Telehealth: Payer: Self-pay | Admitting: Pharmacist

## 2023-12-17 NOTE — Telephone Encounter (Signed)
 Called pt and reminded her to go to lab for lipid panel.

## 2024-01-10 ENCOUNTER — Telehealth: Payer: Self-pay | Admitting: Cardiology

## 2024-01-10 NOTE — Telephone Encounter (Signed)
 Spoke with the patient and she is aware that we will notify her when it is time to schedule her generator change.

## 2024-01-10 NOTE — Telephone Encounter (Signed)
 Patient would like to schedule PPM battery change.

## 2024-01-10 NOTE — Telephone Encounter (Signed)
 Pt had 4 months left at last office visit 12/12/2023.  She has a Medtronic ICD that will alarm when she reaches ERI.  That is the first time she can have the procedure.

## 2024-01-15 NOTE — Telephone Encounter (Signed)
 Spoke w/ patient regarding c/o of hearing an audible tone that is believed to have come from her ICD at 10am this morning. Manual transmission received. Normal device function. Heart failure diagnostics abnormal at this time. No episodes noted. Battery longevity 3 months.   No reasons noted as to why ICD would have made an audible tone. [facility] Tech Support services called. Tech Support states they do not see any episodes noted & that patient ICD has not made an audible tone since 12/12/2023 d/t magnet response. Tech Services assured if ICD makes an audible tone it will track it.   Informed patient audible tone did not come from her ICD. Informed to continue to monitor and if she believes she hears another audible tone to call the device clinic. Advised patient she has 3 months left on battery until RRT. Verbalized understanding.   See HF diagnostic trend below. Patient states she has been experiencing SOB for a while. Optivol has been elevated since August, 2025. Will forward to provider for awareness & further recommendations. Patient states she has PRN Lasix  with her. Lasix  is not on medication list at this time.   Informed patient care team will contact her back for a plan moving forward.   ___________________________________________________________________

## 2024-01-15 NOTE — Telephone Encounter (Signed)
 Patient called to report her device had gone off inside her chest and want a call back regarding next steps.

## 2024-02-25 ENCOUNTER — Encounter

## 2024-05-12 ENCOUNTER — Encounter

## 2024-05-27 ENCOUNTER — Encounter

## 2024-08-11 ENCOUNTER — Encounter

## 2024-08-25 ENCOUNTER — Encounter

## 2024-11-10 ENCOUNTER — Encounter

## 2024-11-24 ENCOUNTER — Encounter

## 2025-02-09 ENCOUNTER — Encounter
